# Patient Record
Sex: Female | Born: 1945 | Race: White | Hispanic: No | Smoking: Former smoker
Health system: Southern US, Community
[De-identification: ages and names within clinical notes are randomized; demographics above are authoritative.]

## PROBLEM LIST (undated history)

## (undated) DIAGNOSIS — R0600 Dyspnea, unspecified: Secondary | ICD-10-CM

## (undated) DIAGNOSIS — F32A Depression, unspecified: Secondary | ICD-10-CM

## (undated) DIAGNOSIS — Z972 Presence of dental prosthetic device (complete) (partial): Secondary | ICD-10-CM

## (undated) DIAGNOSIS — D497 Neoplasm of unspecified behavior of endocrine glands and other parts of nervous system: Secondary | ICD-10-CM

## (undated) DIAGNOSIS — C4491 Basal cell carcinoma of skin, unspecified: Secondary | ICD-10-CM

## (undated) DIAGNOSIS — J45909 Unspecified asthma, uncomplicated: Secondary | ICD-10-CM

## (undated) DIAGNOSIS — E782 Mixed hyperlipidemia: Secondary | ICD-10-CM

## (undated) DIAGNOSIS — M542 Cervicalgia: Secondary | ICD-10-CM

## (undated) DIAGNOSIS — J449 Chronic obstructive pulmonary disease, unspecified: Secondary | ICD-10-CM

## (undated) DIAGNOSIS — Z85828 Personal history of other malignant neoplasm of skin: Secondary | ICD-10-CM

## (undated) DIAGNOSIS — E039 Hypothyroidism, unspecified: Secondary | ICD-10-CM

## (undated) DIAGNOSIS — D649 Anemia, unspecified: Secondary | ICD-10-CM

## (undated) DIAGNOSIS — R238 Other skin changes: Secondary | ICD-10-CM

## (undated) DIAGNOSIS — G4730 Sleep apnea, unspecified: Secondary | ICD-10-CM

## (undated) DIAGNOSIS — R911 Solitary pulmonary nodule: Secondary | ICD-10-CM

## (undated) DIAGNOSIS — M7989 Other specified soft tissue disorders: Secondary | ICD-10-CM

## (undated) DIAGNOSIS — Z973 Presence of spectacles and contact lenses: Secondary | ICD-10-CM

## (undated) DIAGNOSIS — F419 Anxiety disorder, unspecified: Secondary | ICD-10-CM

## (undated) DIAGNOSIS — H269 Unspecified cataract: Secondary | ICD-10-CM

## (undated) DIAGNOSIS — R233 Spontaneous ecchymoses: Secondary | ICD-10-CM

## (undated) DIAGNOSIS — R350 Frequency of micturition: Secondary | ICD-10-CM

## (undated) DIAGNOSIS — R519 Headache, unspecified: Secondary | ICD-10-CM

## (undated) DIAGNOSIS — J189 Pneumonia, unspecified organism: Secondary | ICD-10-CM

## (undated) DIAGNOSIS — I10 Essential (primary) hypertension: Secondary | ICD-10-CM

## (undated) HISTORY — PX: SKIN CANCER EXCISION: SHX779

## (undated) HISTORY — PX: FOOT SURGERY: SHX648

## (undated) HISTORY — DX: Mixed hyperlipidemia: E78.2

## (undated) HISTORY — DX: Anxiety disorder, unspecified: F41.9

## (undated) HISTORY — DX: Essential (primary) hypertension: I10

## (undated) HISTORY — DX: Hypercalcemia: E83.52

## (undated) HISTORY — PX: COLONOSCOPY: SHX174

## (undated) HISTORY — DX: Basal cell carcinoma of skin, unspecified: C44.91

## (undated) HISTORY — PX: EYE SURGERY: SHX253

## (undated) HISTORY — DX: Cervicalgia: M54.2

## (undated) HISTORY — PX: DENTAL SURGERY: SHX609

## (undated) HISTORY — PX: VAGINAL HYSTERECTOMY: SHX2639

---

## 1998-12-02 ENCOUNTER — Other Ambulatory Visit: Admission: RE | Admit: 1998-12-02 | Discharge: 1998-12-02 | Payer: Self-pay | Admitting: Family Medicine

## 2000-10-31 ENCOUNTER — Other Ambulatory Visit: Admission: RE | Admit: 2000-10-31 | Discharge: 2000-10-31 | Payer: Self-pay | Admitting: *Deleted

## 2003-11-17 ENCOUNTER — Emergency Department (HOSPITAL_COMMUNITY): Admission: EM | Admit: 2003-11-17 | Discharge: 2003-11-18 | Payer: Self-pay | Admitting: Emergency Medicine

## 2004-02-05 ENCOUNTER — Other Ambulatory Visit: Admission: RE | Admit: 2004-02-05 | Discharge: 2004-02-05 | Payer: Self-pay | Admitting: Family Medicine

## 2005-02-28 ENCOUNTER — Other Ambulatory Visit: Admission: RE | Admit: 2005-02-28 | Discharge: 2005-02-28 | Payer: Self-pay | Admitting: Family Medicine

## 2005-03-03 ENCOUNTER — Ambulatory Visit: Admission: RE | Admit: 2005-03-03 | Discharge: 2005-03-03 | Payer: Self-pay | Admitting: Family Medicine

## 2006-01-06 ENCOUNTER — Ambulatory Visit (HOSPITAL_BASED_OUTPATIENT_CLINIC_OR_DEPARTMENT_OTHER): Admission: RE | Admit: 2006-01-06 | Discharge: 2006-01-06 | Payer: Self-pay | Admitting: *Deleted

## 2007-08-01 IMAGING — US US-BREAST([ID])
1 series · 12 of 12 positions shown · non-contrast
Comparison: NONE

CLINICAL DATA: Follow-up mammogram. 

RIGHT BREAST ULTRASOUND

[Series 1: us right breast · 0.07mm/px · 12 of 12 slices shown]
[im 1/12]
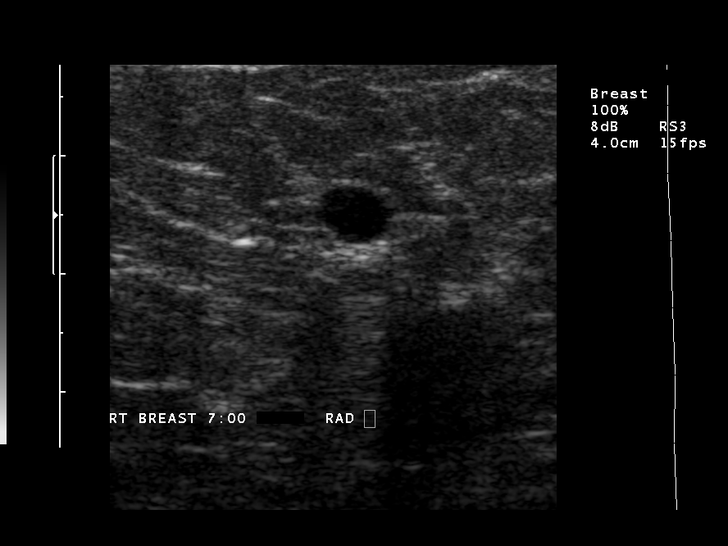
[im 2/12]
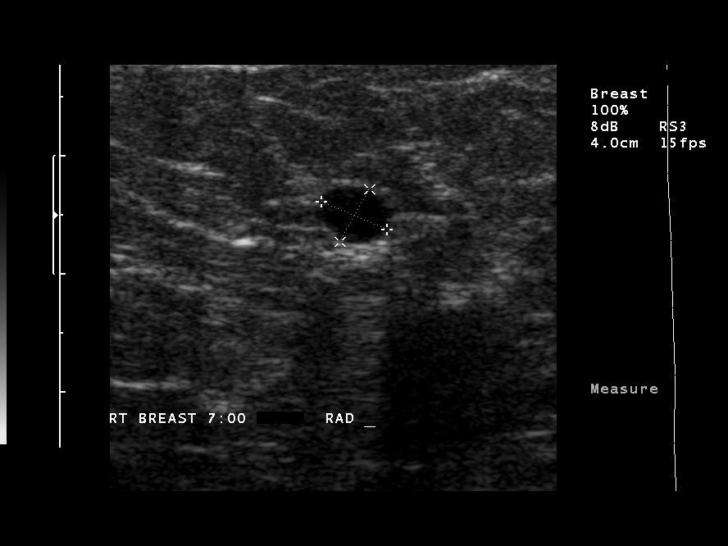
[im 3/12]
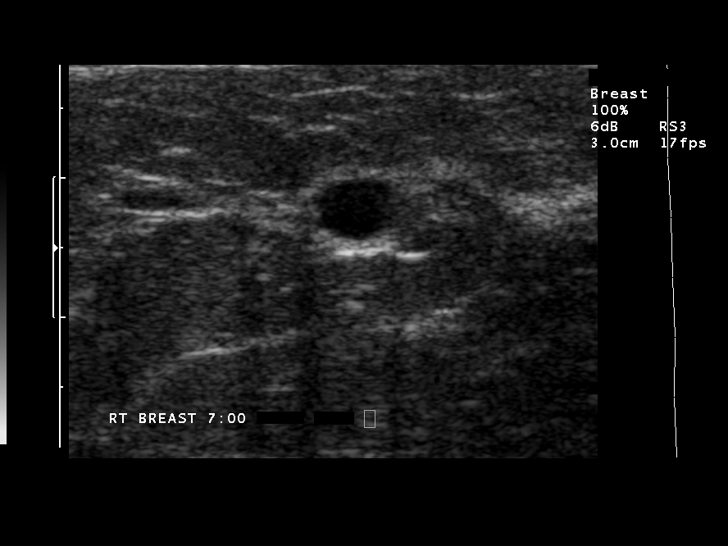
[im 4/12]
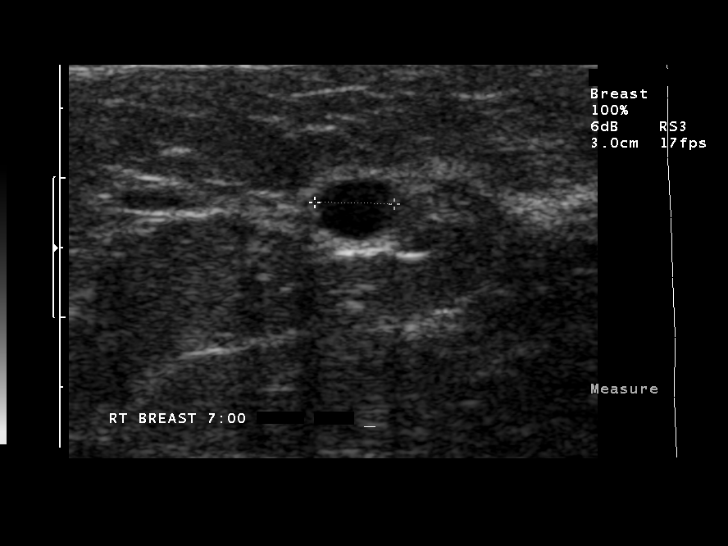
[im 5/12]
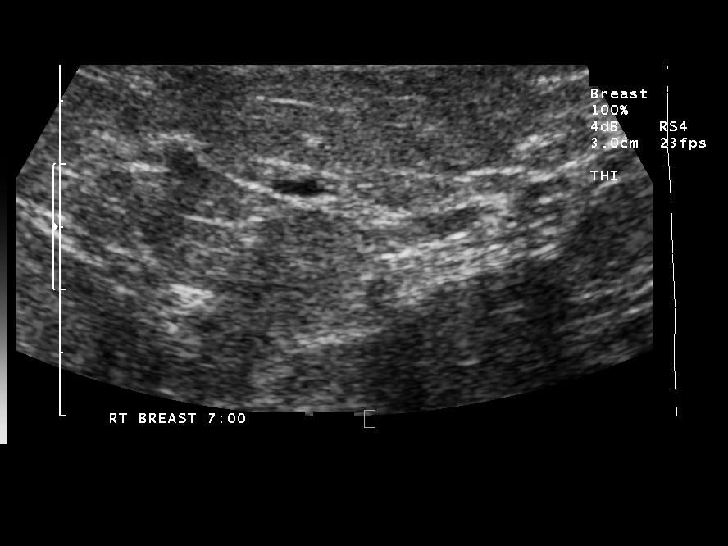
[im 6/12]
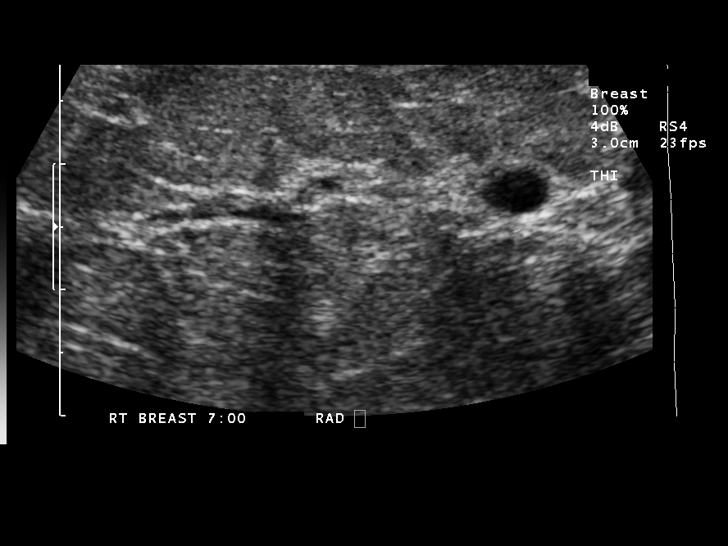
[im 7/12]
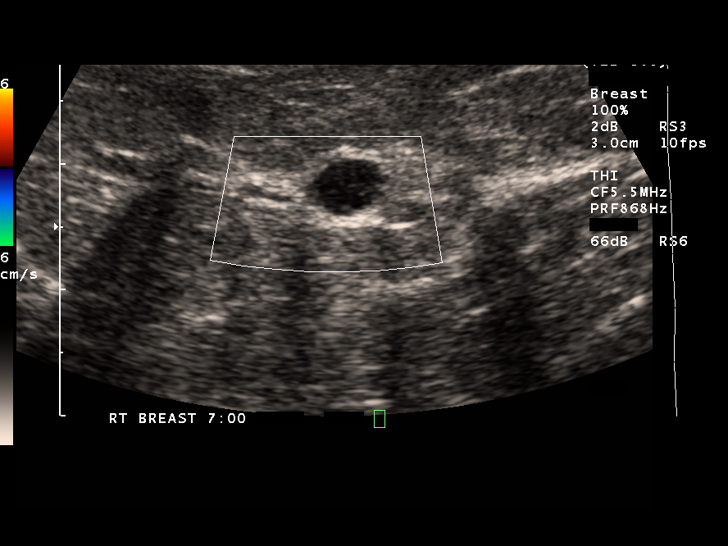
[im 8/12]
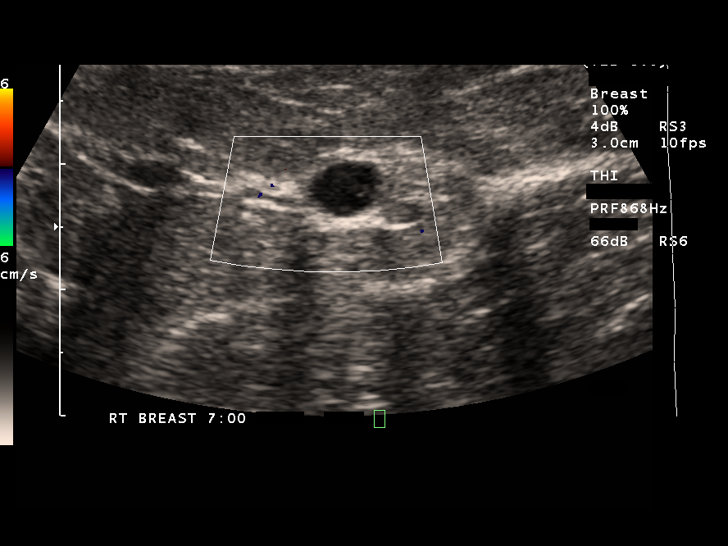
[im 9/12]
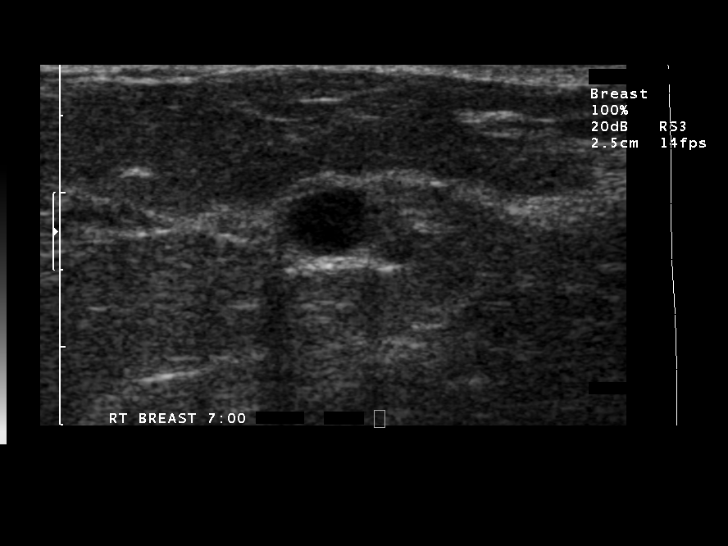
[im 10/12]
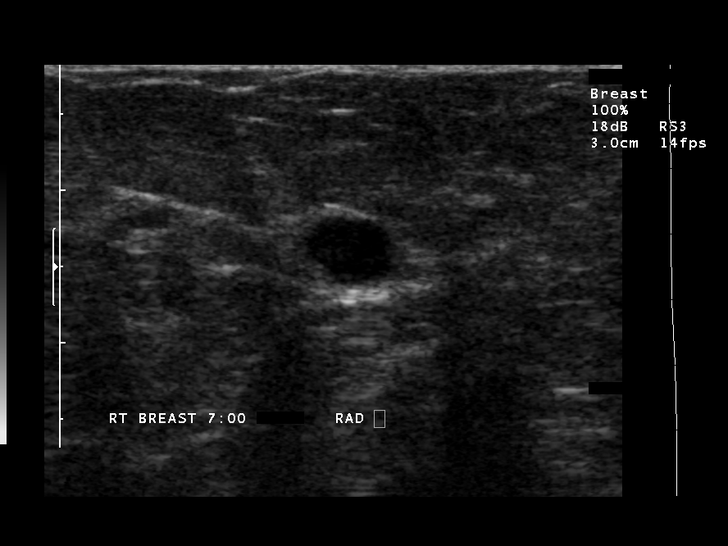
[im 11/12]
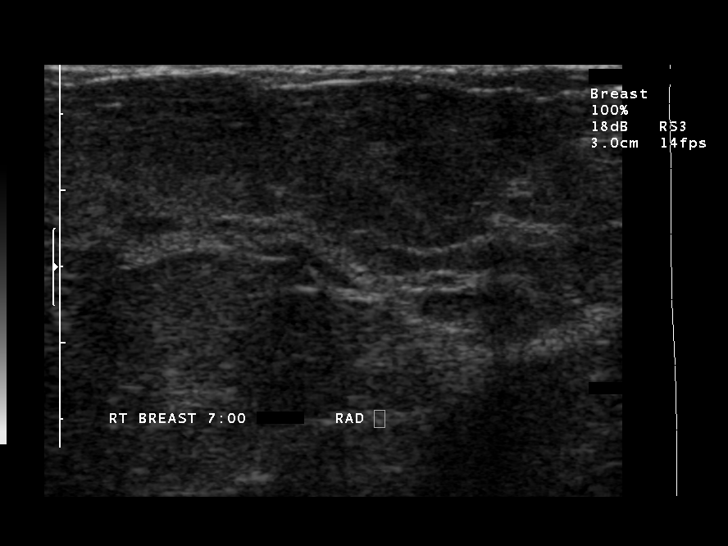
[im 12/12]
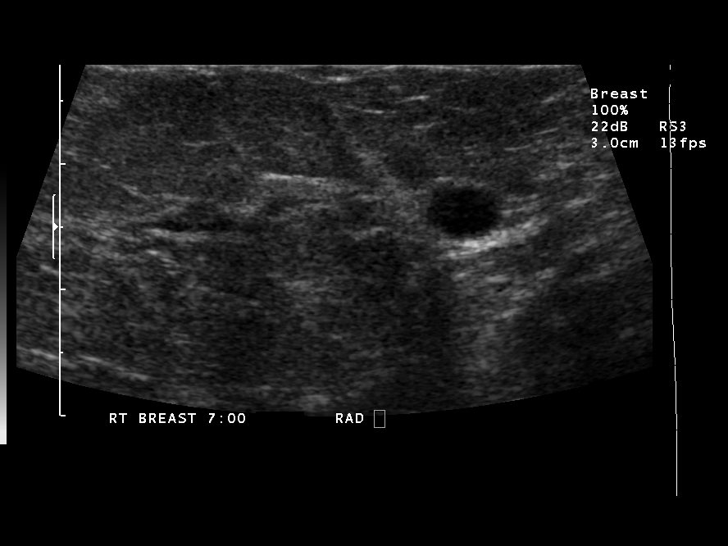

[12 of 12 positions shown; findings below may reference images not displayed]

FINDINGS: Ultrasound was performed to evaluate a nodule noted 
centrally in the inferior right breast.  Located in the 7 o???clock 
position of the right breast, 4 cm from the nipple is an anechoic 
cyst measuring 0.6 x 0.6 x 0.5 cm.  This corresponds in location 
and size to the mammographic finding.  There are scattered mildly 
dilated ducts also noted near by.
IMPRESSION: Additional evaluation of the right breast nodule 
confirms a small simple cyst.  Routine annual screening has been 
recommended. BI-RADS 2- Benign LUSTER, M.D. 
electronically reviewed on [DATE] Dict Date: [DATE]  Tran 
Date: [DATE] CAV  LUSTER

## 2010-05-13 DIAGNOSIS — M949 Disorder of cartilage, unspecified: Secondary | ICD-10-CM

## 2010-05-13 DIAGNOSIS — R32 Unspecified urinary incontinence: Secondary | ICD-10-CM | POA: Insufficient documentation

## 2010-05-13 DIAGNOSIS — C44300 Unspecified malignant neoplasm of skin of unspecified part of face: Secondary | ICD-10-CM | POA: Insufficient documentation

## 2010-05-13 DIAGNOSIS — F411 Generalized anxiety disorder: Secondary | ICD-10-CM | POA: Insufficient documentation

## 2010-05-13 DIAGNOSIS — E782 Mixed hyperlipidemia: Secondary | ICD-10-CM | POA: Insufficient documentation

## 2010-05-13 DIAGNOSIS — C44309 Unspecified malignant neoplasm of skin of other parts of face: Secondary | ICD-10-CM | POA: Insufficient documentation

## 2010-05-13 DIAGNOSIS — I10 Essential (primary) hypertension: Secondary | ICD-10-CM | POA: Insufficient documentation

## 2010-05-13 DIAGNOSIS — M899 Disorder of bone, unspecified: Secondary | ICD-10-CM | POA: Insufficient documentation

## 2010-05-13 DIAGNOSIS — M542 Cervicalgia: Secondary | ICD-10-CM | POA: Insufficient documentation

## 2010-05-14 ENCOUNTER — Ambulatory Visit: Payer: Self-pay | Admitting: Pulmonary Disease

## 2010-05-14 DIAGNOSIS — R0989 Other specified symptoms and signs involving the circulatory and respiratory systems: Secondary | ICD-10-CM | POA: Insufficient documentation

## 2010-05-14 DIAGNOSIS — R0609 Other forms of dyspnea: Secondary | ICD-10-CM | POA: Insufficient documentation

## 2010-06-11 ENCOUNTER — Ambulatory Visit: Payer: Self-pay | Admitting: Pulmonary Disease

## 2010-06-11 DIAGNOSIS — J449 Chronic obstructive pulmonary disease, unspecified: Secondary | ICD-10-CM | POA: Insufficient documentation

## 2010-08-12 ENCOUNTER — Telehealth: Payer: Self-pay | Admitting: Pulmonary Disease

## 2010-11-16 NOTE — Assessment & Plan Note (Signed)
Summary: rov for review of pfts.   Copy to:  [name] Primary Provider/Referring Provider:  [name]  CC:  Pt is here for a f/u appt to discuss PFT results. .  History of Present Illness: The pt comes in today for f/u of her recent pfts.  She was found to have severe airflow obstruction, as well as signficant airtrapping.  Her DLCO was moderately reduced.  I have reviewed the results with the pt, and answered all of her questions.  She is still smoking, and has not been as compliant with her meds as she should due to cost.    Medications Prior to Update: 1)  Amlodipine Besy-Benazepril Hcl 5-10 Mg Caps (Amlodipine Besy-Benazepril Hcl) .... Take 1 Tablet By Mouth Once A Day 2)  Bisoprolol-Hydrochlorothiazide 5-6.25 Mg Tabs (Bisoprolol-Hydrochlorothiazide) .... Take 2 Tabs By Mouth Daily 3)  Pravastatin Sodium 40 Mg Tabs (Pravastatin Sodium) .... Take 1 Tablet By Mouth Once A Day 4)  Buspirone Hcl 30 Mg Tabs (Buspirone Hcl) .... Take 1 Tablet By Mouth Two Times A Day 5)  Ambien 10 Mg Tabs (Zolpidem Tartrate) .... Take 1 Tab By Mouth At Bedtime 6)  Symbicort 80-4.5 Mcg/act  Aero (Budesonide-Formoterol Fumarate) .... Two Puffs Twice Daily 7)  Spiriva Handihaler 18 Mcg Caps (Tiotropium Bromide Monohydrate) .... Inhale Contents of 1 Capsule Once A Day 8)  Proair Hfa 108 (90 Base) Mcg/act  Aers (Albuterol Sulfate) .Marland Kitchen.. 1-2 Puffs Every 4-6 Hours As Needed 9)  Aspirin Low Dose 81 Mg Tabs (Aspirin) .... Take 1 Tablet By Mouth Once A Day.  Allergies (verified): 1)  ! Codeine  Review of Systems       The patient complains of shortness of breath with activity and nasal congestion/difficulty breathing through nose.  The patient denies shortness of breath at rest, productive cough, non-productive cough, coughing up blood, chest pain, irregular heartbeats, acid heartburn, indigestion, loss of appetite, weight change, abdominal pain, difficulty swallowing, sore throat, tooth/dental problems,  headaches, sneezing, itching, ear ache, anxiety, depression, hand/feet swelling, joint stiffness or pain, rash, change in color of mucus, and fever.    Vital Signs:  Patient profile:   65 year old female Height:      60 inches Weight:      165 pounds BMI:     32.34 O2 Sat:      93 % on Room air Temp:     97.5 degrees F oral Pulse rate:   63 / minute BP sitting:   126 / 78  (left arm) Cuff size:   regular  Vitals Entered By: Arman Filter LPN (June 11, 2010 1:44 PM)  O2 Flow:  Room air CC: Pt is here for a f/u appt to discuss PFT results.  Comments Medications reviewed with patient Arman Filter LPN  June 11, 2010 1:50 PM    Physical Exam  General:  ow female in nad Lungs:  decreased bs throughout, no wheezing or rhonchi Heart:  rrr, no mrg Extremities:  mild edema, no cyanosis  Neurologic:  alert and oriented, moves all 4.   Impression & Recommendations:  Problem # 1:  EMPHYSEMA (ICD-492.8)  The pt has severe airflow obstruction on her pfts today, most of which I suspect is irreversible.  She is on a good bronchodilator regimen, although I think she needs to be on the higher strength of symbicort.  The most important thing that she can do is to stop smoking.  I have also discussed with her the role of weight  loss and pulmonary rehab.  I have offered to refer her to the pulmonary rehab program near her, and she will consider.    Medications Added to Medication List This Visit: 1)  Symbicort 160-4.5 Mcg/act Aero (Budesonide-formoterol fumarate) .... Two puffs twice daily  Other Orders: Est. Patient Level III (04540)  Patient Instructions: 1)  will increase your symbicort to the 160/4.5 strength...2 puffs am and pm..rinse your mouth well. 2)  stay on spiriva 3)  work on weight loss, consider pulmonary rehab program in [place]. 4)  followup with me in 6mos.  Prescriptions: SYMBICORT 160-4.5 MCG/ACT  AERO (BUDESONIDE-FORMOTEROL FUMARATE) Two puffs twice daily  #1 x  6   Entered and Authorized by:   Barbaraann Share MD   Signed by:   Barbaraann Share MD on 06/11/2010   Method used:   Print then Give to Patient   RxID:   [id number]

## 2010-11-16 NOTE — Assessment & Plan Note (Signed)
Summary: consult for dyspnea   Copy to:  [name] Primary Provider/Referring Provider:  [name]  CC:  Pulmonary Consult.  History of Present Illness: The pt is a 65y/o female who I have been asked to see for dyspnea.  She notes chronic doe that has been present for years, but she notes progressive symptoms over the last 1-2 years.  She describes a less than one block doe at a moderate pace on flat ground, and gets winded bringing groceries in from the car.  She has a mild cough with white foamy mucus in the am's, and also has had some LE edema.  She has a long h/o smoking, and continues to do so.  She has tried numerous medications for smoking cessation, as well as hypnotism.  She has had a recent cxr and was told it was "ok", but has not had recent pfts.  The pt does note weight gain over the last 2 years, but is not sure how much.  Preventive Screening-Counseling & Management  Alcohol-Tobacco     Smoking Status: current     Smoking Cessation Counseling: yes     Packs/Day: 1.0     Year Started: age 4     Tobacco Counseling: to quit use of tobacco products  Comments: pt has failed chantix, nicotrol inhaler, hypnotism, and wellbutrin  Current Medications (verified): 1)  Amlodipine Besy-Benazepril Hcl 5-10 Mg Caps (Amlodipine Besy-Benazepril Hcl) .... Take 1 Tablet By Mouth Once A Day 2)  Bisoprolol-Hydrochlorothiazide 5-6.25 Mg Tabs (Bisoprolol-Hydrochlorothiazide) .... Take 2 Tabs By Mouth Daily 3)  Pravastatin Sodium 40 Mg Tabs (Pravastatin Sodium) .... Take 1 Tablet By Mouth Once A Day 4)  Buspirone Hcl 30 Mg Tabs (Buspirone Hcl) .... Take 1 Tablet By Mouth Two Times A Day 5)  Ambien 10 Mg Tabs (Zolpidem Tartrate) .... Take 1 Tab By Mouth At Bedtime 6)  Symbicort 80-4.5 Mcg/act  Aero (Budesonide-Formoterol Fumarate) .... Two Puffs Twice Daily 7)  Spiriva Handihaler 18 Mcg Caps (Tiotropium Bromide Monohydrate) .... Inhale Contents of 1 Capsule Once A Day 8)  Proair Hfa 108  (90 Base) Mcg/act  Aers (Albuterol Sulfate) .Marland Kitchen.. 1-2 Puffs Every 4-6 Hours As Needed 9)  Aspirin Low Dose 81 Mg Tabs (Aspirin) .... Take 1 Tablet By Mouth Once A Day.  Allergies (verified): 1)  ! Codeine  Past History:  Past Medical History:   OSTEOPENIA (ICD-733.90) CARCINOMA, BASAL CELL, NOSE (ICD-173.3) URINARY INCONTINENCE (ICD-788.30) ANXIETY (ICD-300.00) CERVICALGIA (ICD-723.1) HYPERTENSION (ICD-401.9) HYPERCALCEMIA (ICD-275.42) HYPERLIPIDEMIA, MIXED (ICD-272.2)    Past Surgical History: hysterectomy without oophorectomy for fibroids  35 years ago  Family History: Reviewed history from 05/13/2010 and no changes required. father: deceased CAD, HTN, asthma, allergies mother: deceased HTN, lung cancer, smoker paternal grandfather: deceased paternal grandmother: deceased maternal grandfather: deceased maternal grandmother: deceased sister: deceased cirrhosis  Social History: Reviewed history from 05/13/2010 and no changes required. current smoker.  1 ppd.  started before age 65.  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 pt has 2 children. Smoking Status:  current Packs/Day:  1.0  Review of Systems       The patient complains of shortness of breath with activity, productive cough, nasal congestion/difficulty breathing through nose, and hand/feet swelling.  The patient denies shortness of breath at rest, non-productive cough, coughing up blood, chest pain, irregular heartbeats, acid heartburn, indigestion, loss of appetite, weight change, abdominal pain, difficulty swallowing, sore throat, tooth/dental problems, headaches, sneezing, itching, ear ache, anxiety, depression, joint stiffness or  pain, rash, change in color of mucus, and fever.    Vital Signs:  Patient profile:   65 year old female Height:      60 inches Weight:      162 pounds BMI:     31.75 O2 Sat:      92 % on Room air Temp:     98.9 degrees F oral Pulse rate:    75 / minute BP sitting:   136 / 72  (left arm) Cuff size:   regular  Vitals Entered By: Arman Filter LPN (May 14, 2010 1:26 PM)  O2 Flow:  Room air  Serial Vital Signs/Assessments:  Comments: 2:05 PM Ambulatory Pulse Oximetry  Resting; HR__72___    02 Sat__94% on room air__  Lap1 (185 feet)   HR__82___   02 Sat__92% on room air___ Lap2 (185 feet)   HR__86___   02 Sat__90% on room air___    Lap3 (185 feet)   HR__89___   02 Sat__90% on room air___  _x_Test Completed without Difficulty ___Test Stopped due to:  By: Michel Bickers CMA   CC: Pulmonary Consult Comments Medications reviewed with patient Arman Filter LPN  May 14, 2010 1:26 PM    Physical Exam  General:  ow female in nad Eyes:  PERRLA and EOMI.   Nose:  deviated septum to left with narrowing Mouth:  clear, no exudates. Neck:  no jvd, tmg, LN Lungs:  mild decrease in bs, no wheezing or rhonchi Heart:  rrr, no mrg Abdomen:  soft and nontender, bs+ Extremities:  no signficant edema, no cyanosis pulses intact distally Neurologic:  alert and oriented, moves all 4.   Impression & Recommendations:  Problem # 1:  DYSPNEA ON EXERTION (ICD-786.09) the pt has doe that I suspect is multifactorial.  She is overweight and deconditioned, and may have underlying lung disease related to her ongoing smoking.  It is unclear whether she may have emphysema, or just airway inflammation related to the smoking.  She is on an excellent bronchodilator regimen, although the symbicort is at the lower dose.  I think she needs to have pfts to establish her underlying disease state and its severity.  I have also discussed with her the importance of smoking cessation, and she is going to try the nicotine patch.  I have also recommended the classes at the cancer center monthly, and have given her the contact number.    Medications Added to Medication List This Visit: 1)  Bisoprolol-hydrochlorothiazide 5-6.25 Mg Tabs  (Bisoprolol-hydrochlorothiazide) .... Take 2 tabs by mouth daily 2)  Symbicort 80-4.5 Mcg/act Aero (Budesonide-formoterol fumarate) .... Two puffs twice daily 3)  Aspirin Low Dose 81 Mg Tabs (Aspirin) .... Take 1 tablet by mouth once a day.  Other Orders: Consultation Level IV (16109) Tobacco use cessation intermediate 3-10 minutes (99406) Pulse Oximetry, Ambulatory (60454) Pulmonary Referral (Pulmonary)  Patient Instructions: 1)  will schedule for breathing tests, and see you back the same day for review. 2)  no change in meds for now. 3)  will get cxr report to review. 4)  work on quitting smoking....consider patch and the classes at cancer center...see brochure.

## 2010-11-16 NOTE — Progress Notes (Signed)
Summary: samples of symbicort  Phone Note Call from Patient   Caller: Patient--[phone] Call For: Kajol Crispen Reason for Call: Talk to Nurse Summary of Call: Requesting samples of symbicort. Initial call taken by: Lehman Prom,  August 12, 2010 10:35 AM  Follow-up for Phone Call        pt advised no samples available at this time.Carron Curie CMA  August 12, 2010 11:15 AM

## 2010-11-16 NOTE — Miscellaneous (Signed)
Summary: Orders Update pft charges  Clinical Lists Changes  Orders: Added new Service order of Carbon Monoxide diffusing w/capacity (94720) - Signed Added new Service order of Lung Volumes (94240) - Signed Added new Service order of Spirometry (Pre & Post) (94060) - Signed 

## 2010-12-08 ENCOUNTER — Ambulatory Visit: Payer: Self-pay | Admitting: Pulmonary Disease

## 2010-12-29 ENCOUNTER — Ambulatory Visit (INDEPENDENT_AMBULATORY_CARE_PROVIDER_SITE_OTHER): Payer: Medicare Other | Admitting: Pulmonary Disease

## 2010-12-29 ENCOUNTER — Encounter: Payer: Self-pay | Admitting: Pulmonary Disease

## 2010-12-29 DIAGNOSIS — J438 Other emphysema: Secondary | ICD-10-CM

## 2011-01-04 NOTE — Assessment & Plan Note (Signed)
Summary: Holly [name] for emphysema   Primary Provider/Referring Provider:  [name]  CC:  6 month f/u appt for emphysema.  Pt states her breathing has improved with the change in Symbicort dosage.  Pt has decreased her smoking to 1/2 ppd. .  History of Present Illness: the pt comes in today for f/u of her known emphysema.  She was increased to the higher dose of symbicort last visit, and has seen improvement in her breathing since that time.  However, she has also cut back on her smoking as well.  She denies any chest congestion or purulent mucus.    Preventive Screening-Counseling & Management  Alcohol-Tobacco     Smoking Status: current     Smoking Cessation Counseling: yes     Packs/Day: 0.5     Tobacco Counseling: to quit use of tobacco products  Current Medications (verified): 1)  Amlodipine Besy-Benazepril Hcl 5-10 Mg Caps (Amlodipine Besy-Benazepril Hcl) .... Take 1 Tablet By Mouth Once A Day 2)  Bisoprolol-Hydrochlorothiazide 5-6.25 Mg Tabs (Bisoprolol-Hydrochlorothiazide) .... Take 2 Tabs By Mouth Daily 3)  Pravastatin Sodium 40 Mg Tabs (Pravastatin Sodium) .... Take 1 Tablet By Mouth Once A Day 4)  Buspirone Hcl 30 Mg Tabs (Buspirone Hcl) .... Take 1 Tablet By Mouth Two Times A Day 5)  Ambien 10 Mg Tabs (Zolpidem Tartrate) .... Take 1 Tab By Mouth At Bedtime 6)  Spiriva Handihaler 18 Mcg Caps (Tiotropium Bromide Monohydrate) .... Inhale Contents of 1 Capsule Once A Day 7)  Proair Hfa 108 (90 Base) Mcg/act  Aers (Albuterol Sulfate) .Marland Kitchen.. 1-2 Puffs Every 4-6 Hours As Needed 8)  Aspirin Low Dose 81 Mg Tabs (Aspirin) .... Take 1 Tablet By Mouth Once A Day. 9)  Symbicort 160-4.5 Mcg/act  Aero (Budesonide-Formoterol Fumarate) .... Two Puffs Twice Daily  Allergies (verified): 1)  ! Codeine  Social History: current smoker.  1 ppd.  started before age 73.  currently 1/2 ppd.  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 pt has 2  children. Packs/Day:  0.5  Review of Systems       The patient complains of weight change, anxiety, and joint stiffness or pain.  The patient denies shortness of breath with activity, shortness of breath at rest, productive cough, non-productive cough, coughing up blood, chest pain, irregular heartbeats, acid heartburn, indigestion, loss of appetite, abdominal pain, difficulty swallowing, sore throat, tooth/dental problems, headaches, nasal congestion/difficulty breathing through nose, sneezing, itching, ear ache, depression, hand/feet swelling, rash, change in color of mucus, and fever.    Vital Signs:  Patient profile:   65 year old female Height:      60 inches Weight:      156.50 pounds BMI:     30.67 O2 Sat:      94 % on Room air Temp:     98.1 degrees F oral Pulse rate:   69 / minute BP sitting:   140 / 80  (right arm) Cuff size:   regular  Vitals Entered By: Arman Filter LPN (December 29, 2010 10:04 AM)  O2 Flow:  Room air CC: 6 month f/u appt for emphysema.  Pt states her breathing has improved with the change in Symbicort dosage.  Pt has decreased her smoking to 1/2 ppd.  Comments Medications reviewed with patient Arman Filter LPN  December 29, 2010 10:07 AM    Physical Exam  General:  ow female in nad  Lungs:  decreased bs, no wheezing or rhonchi  Heart:  rrr,  no mrg  Extremities:  no edema or cyanosis  Neurologic:  alert and oriented, moves all 4    Impression & Recommendations:  Problem # 1:  EMPHYSEMA (ICD-492.8)  the pt is improved since cutting back on smoking and on the higher symbicort dose.  I have asked her to maintain on current meds, and to stop smoking 100%.  She also would benefit from an exercise and weight loss program.  Other Orders: Est. Patient Level III (42595) Tobacco use cessation intermediate 3-10 minutes (63875)  Patient Instructions: 1)  continue on current meds. 2)  stop smoking completely 3)  followup with me in 6mos, or sooner if  having issues.

## 2011-03-04 NOTE — Op Note (Signed)
Holly [name], Holly [name]           ACCOUNT NO.:  [id number]   MEDICAL RECORD NO.:  [id number]          PATIENT TYPE:  AMB   LOCATION:  [facility]                          FACILITY:  [facility]   PHYSICIAN:  Tennis Must Meyerdierks, M.D.DATE OF BIRTH:  29-Aug-1946   DATE OF PROCEDURE:  01/06/2006  DATE OF DISCHARGE:  01/06/2006                                 OPERATIVE REPORT   PREOPERATIVE DIAGNOSIS:  Right carpal tunnel syndrome.   POSTOPERATIVE DIAGNOSIS:  Right carpal tunnel syndrome.   PROCEDURE:  Decompression of median nerve, right carpal tunnel.   SURGEON:  Lowell Bouton, M.D.   ANESTHESIA:  0.5% Marcaine local with sedation.   OPERATIVE FINDINGS:  The patient had no masses in the carpal canal.  The  motor branch of the nerve was intact.   PROCEDURE:  Under 0.5% Marcaine anesthesia with a tourniquet on the right  arm, the right hand was prepped and draped in the usual fashion and after  exsanguinating the limb, the tourniquet was inflated to 250 mmHg.  A 3-cm  longitudinal incision was made in the palm just ulnar to the thenar crease.  Sharp dissection was carried through the subcutaneous tissues.  Blunt  dissection was carried down through the superficial palmar fascia distal to  the ligament.  A hemostat was then placed in the carpal canal up against the  hook of the hamate and the transverse carpal ligament was divided on the  ulnar border of the median nerve.  The proximal end of the ligament was  divided with the scissors after dissecting the nerve away from the  undersurface of the ligament.  The carpal canal was then palpated and was  found to be adequately decompressed.  The nerve was examined and the motor  branch was identified.  The wound was then irrigated with saline and the  skin was closed with 4-0 nylon sutures.  Sterile dressings were applied  followed by a volar wrist splint.  The patient tolerated the procedure well  and went to the recovery room awake  and stable, in good condition.      Lowell Bouton, M.D.  Electronically Signed     EMM/MEDQ  D:  01/06/2006  T:  01/09/2006  Job:  [id number]

## 2011-03-22 ENCOUNTER — Telehealth: Payer: Self-pay | Admitting: Pulmonary Disease

## 2011-03-22 NOTE — Telephone Encounter (Signed)
Pt last seen by [facility] 3.14.12, was told to f/u in 6 months.  Per last note, pt was to continue symbicort 160-4.76mcg and proair prn.  1 sample of each left up front for pt to pick up at her convenience.  Pt is aware.  Will sign off on message.

## 2011-05-23 ENCOUNTER — Telehealth: Payer: Self-pay | Admitting: Pulmonary Disease

## 2011-05-23 NOTE — Telephone Encounter (Signed)
Pt aware 1 sample of each left out front

## 2011-06-30 ENCOUNTER — Encounter: Payer: Self-pay | Admitting: Pulmonary Disease

## 2011-07-01 ENCOUNTER — Ambulatory Visit (INDEPENDENT_AMBULATORY_CARE_PROVIDER_SITE_OTHER): Payer: Medicare Other | Admitting: Pulmonary Disease

## 2011-07-01 ENCOUNTER — Encounter: Payer: Self-pay | Admitting: Pulmonary Disease

## 2011-07-01 VITALS — BP 122/80 | HR 58 | Temp 98.4°F | Ht 60.0 in | Wt 171.2 lb

## 2011-07-01 DIAGNOSIS — J449 Chronic obstructive pulmonary disease, unspecified: Secondary | ICD-10-CM

## 2011-07-01 NOTE — Assessment & Plan Note (Signed)
The pt is maintaining her usual baseline, but admits she had worsening breathing this summer due to heat/humidity and increased quantity of smoking.  She wishes to quit, and will refer her to the smoking cessation classes at the cancer center.  She is also obese and deconditioned, and would benefit from pulm rehab.  I have asked her to stay on her current regimen which seems to be working for her.  I have also cautioned her about her upcoming dental procedure where she will be "put to sleep".  I have asked her to not smoke leading up to the procedure, and to not have procedure if she is sick or not breathing at baseline.

## 2011-07-01 NOTE — Patient Instructions (Signed)
Continue on current meds Stop smoking.  Consider classes at [facility]  See flyer Will refer you to pulmonary rehab at [facility] Stop smoking at least until your upcoming dental procedure. followup with me in 6mos.

## 2011-07-01 NOTE — Progress Notes (Signed)
  Subjective:    Patient ID: Holly [name], female    DOB: 1946/04/21, 65 y.o.   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  She is staying on her bronchodilator regimen compliantly, but unfortunately continues to smoke.  She has gained significant weight since last visit, and has increased her smoking as well, and has noticed some increasing shortness of breath.  The heat and humidity this summer were also factors for her.  She denies any significant worsening of her shortness of breath, and has no significant cough or purulent mucus.  She wishes to participate in pulmonary rehabilitation, and also some type of smoking cessation class.   Review of Systems  Constitutional: Negative for fever and unexpected weight change.  HENT: Positive for dental problem. Negative for ear pain, nosebleeds, congestion, sore throat, rhinorrhea, sneezing, trouble swallowing, postnasal drip and sinus pressure.   Eyes: Negative for redness and itching.  Respiratory: Positive for cough, chest tightness, shortness of breath and wheezing.   Cardiovascular: Negative for palpitations and leg swelling.  Gastrointestinal: Negative for nausea and vomiting.  Genitourinary: Negative for dysuria.  Musculoskeletal: Negative for joint swelling.  Skin: Negative for rash.  Neurological: Negative for headaches.  Hematological: Bruises/bleeds easily.  Psychiatric/Behavioral: Negative for dysphoric mood. The patient is not nervous/anxious.        Objective:   Physical Exam Obese female in no acute distress Nose without purulence or discharge Chest with mild decrease in breath sounds but no wheezes or rhonchi Cardiac exam with regular rate and rhythm Lower extremities without edema, No cyanosis noted Alert and oriented, moves all 4 extremities.       Assessment & Plan:

## 2011-08-03 ENCOUNTER — Encounter (HOSPITAL_COMMUNITY)
Admission: RE | Admit: 2011-08-03 | Discharge: 2011-08-03 | Disposition: A | Discharge status: Home / Self Care | Payer: Medicare Other | Source: Ambulatory Visit | Attending: Pulmonary Disease | Admitting: Pulmonary Disease

## 2011-08-03 DIAGNOSIS — J449 Chronic obstructive pulmonary disease, unspecified: Secondary | ICD-10-CM | POA: Insufficient documentation

## 2011-08-03 DIAGNOSIS — R0602 Shortness of breath: Secondary | ICD-10-CM | POA: Insufficient documentation

## 2011-08-03 DIAGNOSIS — F17200 Nicotine dependence, unspecified, uncomplicated: Secondary | ICD-10-CM | POA: Insufficient documentation

## 2011-08-03 DIAGNOSIS — Z5189 Encounter for other specified aftercare: Secondary | ICD-10-CM | POA: Insufficient documentation

## 2011-08-03 DIAGNOSIS — J4489 Other specified chronic obstructive pulmonary disease: Secondary | ICD-10-CM | POA: Insufficient documentation

## 2011-08-04 ENCOUNTER — Encounter (HOSPITAL_COMMUNITY): Payer: Medicare Other

## 2011-08-09 ENCOUNTER — Encounter (HOSPITAL_COMMUNITY): Payer: Medicare Other

## 2011-08-11 ENCOUNTER — Encounter (HOSPITAL_COMMUNITY): Payer: Medicare Other

## 2011-08-16 ENCOUNTER — Encounter (HOSPITAL_COMMUNITY): Payer: Medicare Other

## 2011-08-18 ENCOUNTER — Encounter (HOSPITAL_COMMUNITY): Payer: Medicare Other

## 2011-08-18 DIAGNOSIS — J449 Chronic obstructive pulmonary disease, unspecified: Secondary | ICD-10-CM | POA: Insufficient documentation

## 2011-08-18 DIAGNOSIS — R0602 Shortness of breath: Secondary | ICD-10-CM | POA: Insufficient documentation

## 2011-08-18 DIAGNOSIS — Z5189 Encounter for other specified aftercare: Secondary | ICD-10-CM | POA: Insufficient documentation

## 2011-08-18 DIAGNOSIS — F17200 Nicotine dependence, unspecified, uncomplicated: Secondary | ICD-10-CM | POA: Insufficient documentation

## 2011-08-18 DIAGNOSIS — J4489 Other specified chronic obstructive pulmonary disease: Secondary | ICD-10-CM | POA: Insufficient documentation

## 2011-08-22 ENCOUNTER — Telehealth: Payer: Self-pay | Admitting: Pulmonary Disease

## 2011-08-22 NOTE — Telephone Encounter (Signed)
I spoke with pt and advised ehr we did not have any samples. Pt verbalized understanding and had no questions

## 2011-08-23 ENCOUNTER — Encounter (HOSPITAL_COMMUNITY): Payer: Medicare Other

## 2011-08-25 ENCOUNTER — Encounter (HOSPITAL_COMMUNITY): Payer: Medicare Other

## 2011-08-30 ENCOUNTER — Encounter (HOSPITAL_COMMUNITY): Payer: Medicare Other

## 2011-09-01 ENCOUNTER — Encounter (HOSPITAL_COMMUNITY): Payer: Medicare Other

## 2011-09-06 ENCOUNTER — Encounter (HOSPITAL_COMMUNITY)
Admission: RE | Admit: 2011-09-06 | Discharge: 2011-09-06 | Disposition: A | Discharge status: Home / Self Care | Payer: Medicare Other | Source: Ambulatory Visit | Attending: Pulmonary Disease | Admitting: Pulmonary Disease

## 2011-09-06 NOTE — Progress Notes (Signed)
Completed home exercise with patient. Discussed exercise progression, type of exercises patient can do at home safely, her routine, RPE/Dyspnea scales, the importance of having a home pulse oximeter and how to use one, environmental factors to watch for when exercising, warning signs and symptoms, when to call MD and CP. Patients' long term goal is to be able to accomplish more ADLs without becoming short of breath.  Patient voices understanding to Home Exercise Regimen. Will continue to encourage and support.

## 2011-09-08 ENCOUNTER — Encounter (HOSPITAL_COMMUNITY): Payer: Medicare Other

## 2011-09-13 ENCOUNTER — Encounter (HOSPITAL_COMMUNITY): Payer: Medicare Other

## 2011-09-15 ENCOUNTER — Encounter (HOSPITAL_COMMUNITY): Payer: Medicare Other

## 2011-09-20 ENCOUNTER — Encounter (HOSPITAL_COMMUNITY): Payer: Medicare Other

## 2011-09-21 NOTE — Progress Notes (Signed)
Mrs Neale came to Rehab area today to discuss dropping from program for 4-6 weeks.  She is going to return to work temporarily.  We discussed keeping up with her home exercise, getting proper rest and continue to work on smoking cessation.  We will readmit when this temporary situation ends.

## 2011-09-22 ENCOUNTER — Encounter (HOSPITAL_COMMUNITY)
Admission: RE | Admit: 2011-09-22 | Discharge: 2011-09-22 | Disposition: A | Discharge status: Home / Self Care | Payer: Medicare Other | Source: Ambulatory Visit | Attending: Pulmonary Disease | Admitting: Pulmonary Disease

## 2011-09-22 DIAGNOSIS — J4489 Other specified chronic obstructive pulmonary disease: Secondary | ICD-10-CM | POA: Insufficient documentation

## 2011-09-22 DIAGNOSIS — R0602 Shortness of breath: Secondary | ICD-10-CM | POA: Insufficient documentation

## 2011-09-22 DIAGNOSIS — Z5189 Encounter for other specified aftercare: Secondary | ICD-10-CM | POA: Insufficient documentation

## 2011-09-22 DIAGNOSIS — F17200 Nicotine dependence, unspecified, uncomplicated: Secondary | ICD-10-CM | POA: Insufficient documentation

## 2011-09-22 DIAGNOSIS — J449 Chronic obstructive pulmonary disease, unspecified: Secondary | ICD-10-CM | POA: Insufficient documentation

## 2011-09-23 ENCOUNTER — Encounter: Payer: Self-pay | Admitting: Pulmonary Disease

## 2011-09-23 ENCOUNTER — Ambulatory Visit (INDEPENDENT_AMBULATORY_CARE_PROVIDER_SITE_OTHER): Payer: Medicare Other | Admitting: Pulmonary Disease

## 2011-09-23 VITALS — BP 116/60 | HR 72 | Temp 98.8°F | Ht 60.0 in | Wt 171.0 lb

## 2011-09-23 DIAGNOSIS — J449 Chronic obstructive pulmonary disease, unspecified: Secondary | ICD-10-CM

## 2011-09-23 DIAGNOSIS — J4489 Other specified chronic obstructive pulmonary disease: Secondary | ICD-10-CM

## 2011-09-23 MED ORDER — PREDNISONE 10 MG PO TABS: ORAL_TABLET | ORAL | Status: DC

## 2011-09-23 NOTE — Progress Notes (Signed)
Addended by: Salli Quarry on: 09/23/2011 02:43 PM   Modules accepted: Orders

## 2011-09-23 NOTE — Assessment & Plan Note (Signed)
The patient has been compliant with her bronchodilators, but unfortunately has continued to smoke.  She is having increased shortness of breath most recently, but is not bringing up purulent mucus.  I would like to treat her with a short course of prednisone, but hold antibiotics for now.  However, she is to call me if she begins to bring up purulent mucus so that we can call in an antibiotic for her.  I have stressed to her again the need to stop smoking, and to get back into pulmonary rehabilitation as soon as possible.

## 2011-09-23 NOTE — Patient Instructions (Signed)
Will treat with a short course of prednisone Can try tylenol cold and sinus for 4-5 days to see if it helps your cold symptoms Try and stop smoking, and get back in pulmonary rehab as soon as your work schedule allows.  Keep followup apptm with me, but call if you begin to cough up nasty mucus.

## 2011-09-23 NOTE — Progress Notes (Signed)
  Subjective:    Patient ID: Holly [name], female    DOB: 1946-03-25, 65 y.o.   MRN: [id number]  HPI The patient comes in today for an acute sick visit.  She has known significant emphysema, and has been counseled on smoking cessation and referred to pulmonary rehabilitation.  She has been compliant with her bronchodilator regimen, but unfortunately continues to smoke.  She notes a few day history of increasing chest tightness, wheezing, and increasing shortness of breath.  She's had a sore throat, and a cough that is dry.  She has had no fevers, chills, or sweats.  The patient had been going to pulmonary rehabilitation, but had to stop because of her work schedule.   Review of Systems  Constitutional: Negative for fever and unexpected weight change.  HENT: Positive for congestion, sore throat and trouble swallowing. Negative for ear pain, nosebleeds, rhinorrhea, sneezing, dental problem, postnasal drip and sinus pressure.   Eyes: Negative for redness and itching.  Respiratory: Positive for cough, shortness of breath and wheezing. Negative for chest tightness.   Cardiovascular: Negative for palpitations and leg swelling.  Gastrointestinal: Negative for nausea and vomiting.  Genitourinary: Negative for dysuria.  Musculoskeletal: Negative for joint swelling.  Skin: Negative for rash.  Neurological: Negative for headaches.  Hematological: Does not bruise/bleed easily.  Psychiatric/Behavioral: Negative for dysphoric mood. The patient is not nervous/anxious.        Objective:   Physical Exam Overweight female in no acute distress Nose without purulence or discharge noted Oropharynx without erythema or exudates. Chest with decreased breath sounds, a few wheezes, prominent upper airway pseudo wheezing. Cardiac exam with regular rate and rhythm Lower extremities without edema, no cyanosis noted Alert and oriented, moves all 4 extremities.       Assessment & Plan:

## 2011-09-26 NOTE — Progress Notes (Signed)
Patient being discharged at this time due to non compliance with attendance. RN spoke with patient and they decided to drop program at this time and will resume rehab at a later time. All paperwork has been sent to scan center. Patient did not have any post documentation.

## 2011-09-27 ENCOUNTER — Encounter (HOSPITAL_COMMUNITY)
Admission: RE | Admit: 2011-09-27 | Discharge: 2011-09-27 | Payer: Medicare Other | Source: Ambulatory Visit | Attending: Pulmonary Disease | Admitting: Pulmonary Disease

## 2011-09-29 ENCOUNTER — Encounter (HOSPITAL_COMMUNITY): Payer: Medicare Other

## 2011-10-04 ENCOUNTER — Encounter (HOSPITAL_COMMUNITY): Payer: Medicare Other

## 2011-10-06 ENCOUNTER — Encounter (HOSPITAL_COMMUNITY): Payer: Medicare Other

## 2011-10-11 ENCOUNTER — Encounter (HOSPITAL_COMMUNITY): Payer: Medicare Other

## 2011-10-13 ENCOUNTER — Encounter (HOSPITAL_COMMUNITY): Payer: Medicare Other

## 2011-10-18 ENCOUNTER — Encounter (HOSPITAL_COMMUNITY): Payer: Medicare Other

## 2011-10-20 ENCOUNTER — Encounter (HOSPITAL_COMMUNITY): Payer: Medicare Other

## 2011-10-25 ENCOUNTER — Encounter (HOSPITAL_COMMUNITY): Payer: Medicare Other

## 2011-10-27 ENCOUNTER — Encounter (HOSPITAL_COMMUNITY): Payer: Medicare Other

## 2011-11-01 ENCOUNTER — Encounter (HOSPITAL_COMMUNITY): Payer: Medicare Other

## 2011-11-03 ENCOUNTER — Encounter (HOSPITAL_COMMUNITY): Payer: Medicare Other

## 2011-11-08 ENCOUNTER — Encounter (HOSPITAL_COMMUNITY): Payer: Medicare Other

## 2011-11-10 ENCOUNTER — Encounter (HOSPITAL_COMMUNITY): Payer: Medicare Other

## 2011-11-15 ENCOUNTER — Encounter (HOSPITAL_COMMUNITY): Payer: Medicare Other

## 2011-11-17 ENCOUNTER — Encounter (HOSPITAL_COMMUNITY): Payer: Medicare Other

## 2011-11-22 ENCOUNTER — Encounter (HOSPITAL_COMMUNITY): Payer: Medicare Other

## 2011-12-29 ENCOUNTER — Telehealth: Payer: Self-pay | Admitting: Pulmonary Disease

## 2011-12-29 NOTE — Telephone Encounter (Signed)
Called and spoke with pt.  Informed her no samples avail at this time.  Nothing further needed.

## 2011-12-30 ENCOUNTER — Ambulatory Visit: Payer: Medicare Other | Admitting: Pulmonary Disease

## 2012-01-11 ENCOUNTER — Ambulatory Visit: Payer: Medicare Other | Admitting: Pulmonary Disease

## 2012-01-25 ENCOUNTER — Ambulatory Visit: Payer: Medicare Other | Admitting: Pulmonary Disease

## 2012-02-06 ENCOUNTER — Encounter: Payer: Self-pay | Admitting: Pulmonary Disease

## 2012-02-06 ENCOUNTER — Ambulatory Visit (INDEPENDENT_AMBULATORY_CARE_PROVIDER_SITE_OTHER): Payer: Medicare Other | Admitting: Pulmonary Disease

## 2012-02-06 VITALS — BP 130/64 | HR 58 | Temp 98.4°F | Ht 60.0 in | Wt 162.8 lb

## 2012-02-06 DIAGNOSIS — J449 Chronic obstructive pulmonary disease, unspecified: Secondary | ICD-10-CM | POA: Diagnosis not present

## 2012-02-06 NOTE — Progress Notes (Signed)
  Subjective:    Patient ID: Holly [name], female    DOB: April 20, 1946, 66 y.o.   MRN: [id number]  HPI Patient comes in today for followup of her known COPD.  She is staying on her bronchodilator regimen, but unfortunately continues to smoke excessively.  She has a chronic cough with mucous production, but does not feel that her breathing is any worse than usual baseline.   Review of Systems  Constitutional: Negative for fever and unexpected weight change.  HENT: Positive for congestion, rhinorrhea and postnasal drip. Negative for ear pain, nosebleeds, sore throat, sneezing, trouble swallowing, dental problem and sinus pressure.   Eyes: Negative for redness and itching.  Respiratory: Positive for cough, shortness of breath and wheezing. Negative for chest tightness.   Cardiovascular: Negative for palpitations and leg swelling.  Gastrointestinal: Negative for nausea and vomiting.  Genitourinary: Negative for dysuria.  Musculoskeletal: Negative for joint swelling.  Skin: Negative for rash.  Neurological: Negative for headaches.  Hematological: Does not bruise/bleed easily.  Psychiatric/Behavioral: Negative for dysphoric mood. The patient is not nervous/anxious.        Objective:   Physical Exam Obese female in no acute distress Nose without purulence or discharge noted Chest with decreased breath sounds throughout but no wheezing Cardiac exam with regular rate and rhythm Lower extremities without edema, no cyanosis Alert and oriented, moves all 4 extremities.       Assessment & Plan:

## 2012-02-06 NOTE — Assessment & Plan Note (Signed)
The patient has known severe COPD by her PFTs, but unfortunately continues to smoke.  Her current symptoms are near her usual baseline, but she continues to have a cough with mucus production.  I have told her this will be an ongoing issue until she totally quit smoking.  I have asked her to continue with her current bronchodilator regimen, and to work on total smoking cessation.

## 2012-02-06 NOTE — Patient Instructions (Signed)
No change in breathing medications.  Work on total smoking cessation.   followup with me in 6mos, but call if breathing issues worsen.

## 2012-03-02 DIAGNOSIS — Z85828 Personal history of other malignant neoplasm of skin: Secondary | ICD-10-CM | POA: Diagnosis not present

## 2012-03-02 DIAGNOSIS — L820 Inflamed seborrheic keratosis: Secondary | ICD-10-CM | POA: Diagnosis not present

## 2012-03-02 DIAGNOSIS — L578 Other skin changes due to chronic exposure to nonionizing radiation: Secondary | ICD-10-CM | POA: Diagnosis not present

## 2012-03-02 DIAGNOSIS — D692 Other nonthrombocytopenic purpura: Secondary | ICD-10-CM | POA: Diagnosis not present

## 2012-03-20 DIAGNOSIS — N63 Unspecified lump in unspecified breast: Secondary | ICD-10-CM | POA: Diagnosis not present

## 2012-03-20 DIAGNOSIS — Z09 Encounter for follow-up examination after completed treatment for conditions other than malignant neoplasm: Secondary | ICD-10-CM | POA: Diagnosis not present

## 2012-03-30 ENCOUNTER — Telehealth: Payer: Self-pay | Admitting: Pulmonary Disease

## 2012-03-30 NOTE — Telephone Encounter (Signed)
I spoke with pt and is aware we have no samples currently. She voiced her understanding and had no questions

## 2012-04-24 DIAGNOSIS — R634 Abnormal weight loss: Secondary | ICD-10-CM | POA: Diagnosis not present

## 2012-04-24 DIAGNOSIS — E785 Hyperlipidemia, unspecified: Secondary | ICD-10-CM | POA: Diagnosis not present

## 2012-04-24 DIAGNOSIS — R1310 Dysphagia, unspecified: Secondary | ICD-10-CM | POA: Diagnosis not present

## 2012-04-24 DIAGNOSIS — N899 Noninflammatory disorder of vagina, unspecified: Secondary | ICD-10-CM | POA: Diagnosis not present

## 2012-04-24 DIAGNOSIS — R3915 Urgency of urination: Secondary | ICD-10-CM | POA: Diagnosis not present

## 2012-04-24 DIAGNOSIS — F329 Major depressive disorder, single episode, unspecified: Secondary | ICD-10-CM | POA: Diagnosis not present

## 2012-04-24 DIAGNOSIS — I10 Essential (primary) hypertension: Secondary | ICD-10-CM | POA: Diagnosis not present

## 2012-05-01 DIAGNOSIS — H2510 Age-related nuclear cataract, unspecified eye: Secondary | ICD-10-CM | POA: Diagnosis not present

## 2012-05-01 DIAGNOSIS — H5200 Hypermetropia, unspecified eye: Secondary | ICD-10-CM | POA: Diagnosis not present

## 2012-05-09 ENCOUNTER — Other Ambulatory Visit (HOSPITAL_COMMUNITY): Payer: Self-pay | Admitting: Family Medicine

## 2012-05-15 ENCOUNTER — Encounter (HOSPITAL_COMMUNITY): Payer: Medicare Other

## 2012-06-12 DIAGNOSIS — R1310 Dysphagia, unspecified: Secondary | ICD-10-CM | POA: Diagnosis not present

## 2012-06-12 DIAGNOSIS — Z1211 Encounter for screening for malignant neoplasm of colon: Secondary | ICD-10-CM | POA: Diagnosis not present

## 2012-06-15 ENCOUNTER — Emergency Department (HOSPITAL_COMMUNITY)
Admission: EM | Admit: 2012-06-15 | Discharge: 2012-06-16 | Disposition: A | Discharge status: Home / Self Care | Payer: Medicare Other | Attending: Emergency Medicine | Admitting: Emergency Medicine

## 2012-06-15 ENCOUNTER — Encounter (HOSPITAL_COMMUNITY): Payer: Self-pay | Admitting: Emergency Medicine

## 2012-06-15 DIAGNOSIS — F17200 Nicotine dependence, unspecified, uncomplicated: Secondary | ICD-10-CM | POA: Diagnosis not present

## 2012-06-15 DIAGNOSIS — I10 Essential (primary) hypertension: Secondary | ICD-10-CM | POA: Diagnosis not present

## 2012-06-15 DIAGNOSIS — E785 Hyperlipidemia, unspecified: Secondary | ICD-10-CM | POA: Diagnosis not present

## 2012-06-15 DIAGNOSIS — R040 Epistaxis: Secondary | ICD-10-CM

## 2012-06-15 DIAGNOSIS — Z7982 Long term (current) use of aspirin: Secondary | ICD-10-CM | POA: Diagnosis not present

## 2012-06-15 LAB — CBC WITH DIFFERENTIAL/PLATELET
Basophils Absolute: 0 10*3/uL (ref 0.0–0.1)
Basophils Relative: 0 % (ref 0–1)
Eosinophils Absolute: 0.3 10*3/uL (ref 0.0–0.7)
Eosinophils Relative: 2 % (ref 0–5)
HCT: 44.3 % (ref 36.0–46.0)
Hemoglobin: 15 g/dL (ref 12.0–15.0)
Lymphocytes Relative: 16 % (ref 12–46)
Lymphs Abs: 1.8 10*3/uL (ref 0.7–4.0)
MCH: 32 pg (ref 26.0–34.0)
MCHC: 33.9 g/dL (ref 30.0–36.0)
MCV: 94.5 fL (ref 78.0–100.0)
Monocytes Absolute: 0.8 10*3/uL (ref 0.1–1.0)
Monocytes Relative: 8 % (ref 3–12)
Neutro Abs: 8.1 10*3/uL — ABNORMAL HIGH (ref 1.7–7.7)
Neutrophils Relative %: 74 % (ref 43–77)
Platelets: 192 10*3/uL (ref 150–400)
RBC: 4.69 MIL/uL (ref 3.87–5.11)
RDW: 14.8 % (ref 11.5–15.5)
WBC: 11 10*3/uL — ABNORMAL HIGH (ref 4.0–10.5)

## 2012-06-15 LAB — COMPREHENSIVE METABOLIC PANEL
ALT: 12 U/L (ref 0–35)
AST: 16 U/L (ref 0–37)
Albumin: 3.7 g/dL (ref 3.5–5.2)
Alkaline Phosphatase: 96 U/L (ref 39–117)
BUN: 16 mg/dL (ref 6–23)
CO2: 29 mEq/L (ref 19–32)
Calcium: 10.2 mg/dL (ref 8.4–10.5)
Chloride: 99 mEq/L (ref 96–112)
Creatinine, Ser: 0.94 mg/dL (ref 0.50–1.10)
GFR calc Af Amer: 71 mL/min — ABNORMAL LOW (ref >90)
GFR calc non Af Amer: 61 mL/min — ABNORMAL LOW (ref >90)
Glucose, Bld: 124 mg/dL — ABNORMAL HIGH (ref 70–99)
Potassium: 3.6 mEq/L (ref 3.5–5.1)
Sodium: 137 mEq/L (ref 135–145)
Total Bilirubin: 0.3 mg/dL (ref 0.3–1.2)
Total Protein: 7 g/dL (ref 6.0–8.3)

## 2012-06-15 NOTE — ED Notes (Signed)
Report given via EMS. Pt c/o excessive epistaxis and left eye bleed that started at 2030. EMS gave Afrin during transport. Pt takes 81 mg of Aspirin daily. Denies pain, headache. Medications from home at bedside. Hx COPD, HTN, high cholesterol. Initial VS BP 140/70 O2 98% Pulse 84 at 2125. Pt has blood noted on shirt and is currently applying pressure on appearance. Denies trauma.

## 2012-06-15 NOTE — ED Notes (Signed)
Denies hx of stroke and denies hx of family stroke.

## 2012-06-16 MED ORDER — AMOXICILLIN-POT CLAVULANATE 875-125 MG PO TABS: 1.0000 | ORAL_TABLET | Freq: Two times a day (BID) | ORAL | Status: AC

## 2012-06-16 NOTE — ED Notes (Signed)
Pt received nose packing via MD.

## 2012-06-16 NOTE — ED Notes (Signed)
MD at bedside. 

## 2012-06-16 NOTE — ED Provider Notes (Signed)
History     CSN: [id number]  Arrival date & time 06/15/12  2147   First MD Initiated Contact with Patient 06/15/12 2307      Chief Complaint  Patient presents with  . Epistaxis    (Consider location/radiation/quality/duration/timing/severity/associated sxs/prior treatment) HPI L sided. Onset tonight, denies trauma or FB to nose. No recent cough, cold, sneezing or congestion, has allergies with on/ off symptoms.  No blood thinners, bleeding not controlled at home.  No associated emesis. denes h/o same. Mod in severity.  Past Medical History  Diagnosis Date  . Osteopenia   . Urinary incontinence   . Anxiety   . Cervicalgia   . Hypertension   . Hypercalcemia   . Mixed hyperlipidemia   . Basal cell carcinoma     Past Surgical History  Procedure Date  . Vaginal hysterectomy     Family History  Problem Relation Age of Onset  . Coronary artery disease Father   . Hypertension Father   . Asthma Father   . Allergies Father   . Hypertension Mother   . Lung cancer Mother     History  Substance Use Topics  . Smoking status: Current Everyday Smoker -- 1.0 packs/day for 20 years  . Smokeless tobacco: Not on file  . Alcohol Use: Not on file    OB History    Grav Para Term Preterm Abortions TAB SAB Ect Mult Living                  Review of Systems  Constitutional: Negative for fever and chills.  HENT: Negative for congestion, rhinorrhea, sneezing, neck pain, neck stiffness and sinus pressure.   Eyes: Negative for pain.  Respiratory: Negative for shortness of breath.   Cardiovascular: Negative for chest pain.  Gastrointestinal: Negative for abdominal pain.  Genitourinary: Negative for dysuria.  Musculoskeletal: Negative for back pain.  Skin: Negative for rash.  Neurological: Negative for headaches.  All other systems reviewed and are negative.    Allergies  Codeine  Home Medications   Current Outpatient Rx  Name Route Sig Dispense Refill  . ALBUTEROL  SULFATE HFA 108 (90 BASE) MCG/ACT IN AERS Inhalation Inhale 2 puffs into the lungs every 6 (six) hours as needed.      Marland Kitchen AMLODIPINE BESY-BENAZEPRIL HCL 5-10 MG PO CAPS Oral Take 1 capsule by mouth daily.      . ASPIRIN 81 MG PO TABS Oral Take 81 mg by mouth daily.      . BUDESONIDE-FORMOTEROL FUMARATE 160-4.5 MCG/ACT IN AERO Inhalation Inhale 2 puffs into the lungs 2 (two) times daily.      . BUSPIRONE HCL 30 MG PO TABS Oral Take 30 mg by mouth 2 (two) times daily.      Marland Kitchen PRAVASTATIN SODIUM 40 MG PO TABS Oral Take 40 mg by mouth daily.      Marland Kitchen TIOTROPIUM BROMIDE MONOHYDRATE 18 MCG IN CAPS Inhalation Place 18 mcg into inhaler and inhale daily.     Marland Kitchen ZOLPIDEM TARTRATE 10 MG PO TABS Oral Take 5 mg by mouth at bedtime as needed. Insomnia      BP 140/73  Pulse 73  Temp 99.2 F (37.3 C) (Oral)  Resp 16  SpO2 95%  Physical Exam  Constitutional: She is oriented to person, place, and time. She appears well-developed and well-nourished.  HENT:  Head: Normocephalic and atraumatic.       Mild oozing from L nare, unable to visualize bleed site.   Eyes: Conjunctivae and EOM  are normal. Pupils are equal, round, and reactive to light.  Neck: Trachea normal. Neck supple. No thyromegaly present.  Cardiovascular: Normal rate, regular rhythm, S1 normal, S2 normal and normal pulses.     No systolic murmur is present   No diastolic murmur is present  Pulses:      Radial pulses are 2+ on the right side, and 2+ on the left side.  Pulmonary/Chest: Effort normal and breath sounds normal. She has no wheezes. She has no rhonchi. She has no rales. She exhibits no tenderness.  Abdominal: Soft. Normal appearance and bowel sounds are normal. There is no tenderness. There is no CVA tenderness and negative Murphy's sign.  Musculoskeletal:       BLE:s Calves nontender, no cords or erythema, negative Homans sign  Neurological: She is alert and oriented to person, place, and time. She has normal strength. No cranial  nerve deficit or sensory deficit. GCS eye subscore is 4. GCS verbal subscore is 5. GCS motor subscore is 6.  Skin: Skin is warm and dry. No rash noted. She is not diaphoretic.  Psychiatric: Her speech is normal.       Cooperative and appropriate    ED Course  EPISTAXIS MANAGEMENT Date/Time: 06/15/2012 7:50 PM Performed by: Sunnie Nielsen Authorized by: Sunnie Nielsen Risks and benefits: risks, benefits and alternatives were discussed Consent given by: patient Patient understanding: patient states understanding of the procedure being performed Patient consent: the patient's understanding of the procedure matches consent given Procedure consent: procedure consent matches procedure scheduled Required items: required blood products, implants, devices, and special equipment available Patient identity confirmed: verbally with patient Time out: Immediately prior to procedure a "time out" was called to verify the correct patient, procedure, equipment, support staff and site/side marked as required. Preparation: Patient was prepped and draped in the usual sterile fashion. Treatment site: left posterior Repair method: merocel sponge Post-procedure assessment: bleeding stopped Treatment complexity: complex Patient tolerance: Patient tolerated the procedure well with no immediate complications. Comments: Nose blown and neosynephrine applied.  Recheck min bleeding without visualized bleed site.  Discussed options and PT opted for nasal packing. Bacitracin and packing applied, on recheck after 30 min, bleeding stopped.    (including critical care time)  Results for orders placed during the hospital encounter of 06/15/12  CBC WITH DIFFERENTIAL      Component Value Range   WBC 11.0 (*) 4.0 - 10.5 K/uL   RBC 4.69  3.87 - 5.11 MIL/uL   Hemoglobin 15.0  12.0 - 15.0 g/dL   HCT 16.1  09.6 - 04.5 %   MCV 94.5  78.0 - 100.0 fL   MCH 32.0  26.0 - 34.0 pg   MCHC 33.9  30.0 - 36.0 g/dL   RDW 40.9  81.1 -  91.4 %   Platelets 192  150 - 400 K/uL   Neutrophils Relative 74  43 - 77 %   Neutro Abs 8.1 (*) 1.7 - 7.7 K/uL   Lymphocytes Relative 16  12 - 46 %   Lymphs Abs 1.8  0.7 - 4.0 K/uL   Monocytes Relative 8  3 - 12 %   Monocytes Absolute 0.8  0.1 - 1.0 K/uL   Eosinophils Relative 2  0 - 5 %   Eosinophils Absolute 0.3  0.0 - 0.7 K/uL   Basophils Relative 0  0 - 1 %   Basophils Absolute 0.0  0.0 - 0.1 K/uL  COMPREHENSIVE METABOLIC PANEL      Component Value Range  Sodium 137  135 - 145 mEq/L   Potassium 3.6  3.5 - 5.1 mEq/L   Chloride 99  96 - 112 mEq/L   CO2 29  19 - 32 mEq/L   Glucose, Bld 124 (*) 70 - 99 mg/dL   BUN 16  6 - 23 mg/dL   Creatinine, Ser 1.61  0.50 - 1.10 mg/dL   Calcium 09.6  8.4 - 04.5 mg/dL   Total Protein 7.0  6.0 - 8.3 g/dL   Albumin 3.7  3.5 - 5.2 g/dL   AST 16  0 - 37 U/L   ALT 12  0 - 35 U/L   Alkaline Phosphatase 96  39 - 117 U/L   Total Bilirubin 0.3  0.3 - 1.2 mg/dL   GFR calc non Af Amer 61 (*) >90 mL/min   GFR calc Af Amer 71 (*) >90 mL/min     EPISTAXIS.   Labs obtained - Normal platelets and H/H  ENT referral and plan f/u for packing removal and further evaluation.  RX Augmentin provided. Strict return precautions verbalized as understood.  MDM   VS and nursing notes reviewed. L nasal packing placed. Labs reviewed.Serial evaluations         Sunnie Nielsen, MD 06/19/12 [phone]

## 2012-06-20 DIAGNOSIS — R040 Epistaxis: Secondary | ICD-10-CM | POA: Diagnosis not present

## 2012-07-13 DIAGNOSIS — Z1211 Encounter for screening for malignant neoplasm of colon: Secondary | ICD-10-CM | POA: Diagnosis not present

## 2012-07-13 DIAGNOSIS — R1310 Dysphagia, unspecified: Secondary | ICD-10-CM | POA: Diagnosis not present

## 2012-07-13 DIAGNOSIS — K621 Rectal polyp: Secondary | ICD-10-CM | POA: Diagnosis not present

## 2012-07-13 DIAGNOSIS — R634 Abnormal weight loss: Secondary | ICD-10-CM | POA: Diagnosis not present

## 2012-07-13 DIAGNOSIS — K319 Disease of stomach and duodenum, unspecified: Secondary | ICD-10-CM | POA: Diagnosis not present

## 2012-07-13 DIAGNOSIS — K620 Anal polyp: Secondary | ICD-10-CM | POA: Diagnosis not present

## 2012-07-13 DIAGNOSIS — K5730 Diverticulosis of large intestine without perforation or abscess without bleeding: Secondary | ICD-10-CM | POA: Diagnosis not present

## 2012-07-13 DIAGNOSIS — D126 Benign neoplasm of colon, unspecified: Secondary | ICD-10-CM | POA: Diagnosis not present

## 2012-08-07 ENCOUNTER — Ambulatory Visit: Payer: Medicare Other | Admitting: Pulmonary Disease

## 2012-08-28 ENCOUNTER — Encounter: Payer: Self-pay | Admitting: Pulmonary Disease

## 2012-08-28 ENCOUNTER — Ambulatory Visit (INDEPENDENT_AMBULATORY_CARE_PROVIDER_SITE_OTHER): Payer: Medicare Other | Admitting: Pulmonary Disease

## 2012-08-28 VITALS — BP 110/80 | HR 72 | Temp 98.1°F | Ht 60.0 in | Wt 158.0 lb

## 2012-08-28 DIAGNOSIS — Z23 Encounter for immunization: Secondary | ICD-10-CM

## 2012-08-28 DIAGNOSIS — J449 Chronic obstructive pulmonary disease, unspecified: Secondary | ICD-10-CM | POA: Diagnosis not present

## 2012-08-28 NOTE — Progress Notes (Signed)
  Subjective:    Patient ID: Holly [name], female    DOB: Dec 29, 1945, 66 y.o.   MRN: [id number]  HPI Patient comes in today for followup of her known COPD.  She is on a very aggressive bronchodilator regimen, and feels that she has done much better with this.  She has had no recent acute exacerbation of pulmonary infection.  Unfortunately she continues to smoke, but is seriously considering trying harder to quit.  She has been staying active, and feels that her exertional tolerance is actually better the last 6 months.   Review of Systems  Constitutional: Negative for fever and unexpected weight change.  HENT: Positive for congestion and rhinorrhea. Negative for ear pain, nosebleeds, sore throat, sneezing, trouble swallowing, dental problem, postnasal drip and sinus pressure.   Eyes: Negative for redness and itching.  Respiratory: Negative for cough, chest tightness, shortness of breath and wheezing.   Cardiovascular: Negative for palpitations and leg swelling.  Gastrointestinal: Negative for nausea and vomiting.  Genitourinary: Negative for dysuria.  Musculoskeletal: Negative for joint swelling.  Skin: Negative for rash.  Neurological: Negative for headaches.  Hematological: Bruises/bleeds easily.  Psychiatric/Behavioral: Negative for dysphoric mood. The patient is not nervous/anxious.        Objective:   Physical Exam Overweight female in no acute distress Nose without purulence or discharge noted Chest with decreased breath sounds, but no wheezes or rhonchi Cardiac exam with regular rate and rhythm Lower extremities without edema, no cyanosis Alert and oriented, moves all 4 extremities.       Assessment & Plan:

## 2012-08-28 NOTE — Patient Instructions (Addendum)
No change in medications. Try real hard to quit smoking. Will give you the flu shot today followup with me in 6mos.

## 2012-08-28 NOTE — Assessment & Plan Note (Signed)
The pt is stable on her current BD regimen.  She has not had a recent pulmonary infection or acute exacerbation.  I have asked her to continue on her meds, and to try to totally quit smoking.  Will give her the flu shot today, and she will followup with me in 6mos.

## 2012-10-05 DIAGNOSIS — J441 Chronic obstructive pulmonary disease with (acute) exacerbation: Secondary | ICD-10-CM | POA: Diagnosis not present

## 2012-10-05 DIAGNOSIS — J069 Acute upper respiratory infection, unspecified: Secondary | ICD-10-CM | POA: Diagnosis not present

## 2012-10-15 DIAGNOSIS — J329 Chronic sinusitis, unspecified: Secondary | ICD-10-CM | POA: Diagnosis not present

## 2012-10-15 DIAGNOSIS — J449 Chronic obstructive pulmonary disease, unspecified: Secondary | ICD-10-CM | POA: Diagnosis not present

## 2013-01-23 DIAGNOSIS — I10 Essential (primary) hypertension: Secondary | ICD-10-CM | POA: Diagnosis not present

## 2013-01-23 DIAGNOSIS — F411 Generalized anxiety disorder: Secondary | ICD-10-CM | POA: Diagnosis not present

## 2013-01-23 DIAGNOSIS — F17200 Nicotine dependence, unspecified, uncomplicated: Secondary | ICD-10-CM | POA: Diagnosis not present

## 2013-01-23 DIAGNOSIS — E785 Hyperlipidemia, unspecified: Secondary | ICD-10-CM | POA: Diagnosis not present

## 2013-01-23 DIAGNOSIS — J449 Chronic obstructive pulmonary disease, unspecified: Secondary | ICD-10-CM | POA: Diagnosis not present

## 2013-01-24 ENCOUNTER — Other Ambulatory Visit (HOSPITAL_COMMUNITY): Payer: Self-pay | Admitting: Family Medicine

## 2013-02-06 ENCOUNTER — Encounter (HOSPITAL_COMMUNITY)
Admission: RE | Admit: 2013-02-06 | Discharge: 2013-02-06 | Disposition: A | Discharge status: Home / Self Care | Payer: Medicare Other | Source: Ambulatory Visit | Attending: Family Medicine | Admitting: Family Medicine

## 2013-02-06 DIAGNOSIS — E215 Disorder of parathyroid gland, unspecified: Secondary | ICD-10-CM | POA: Diagnosis not present

## 2013-02-06 IMAGING — NM NM PARATHYROID W/ SPECT
4 series · 24 of 24 positions shown · non-contrast
Comparison: None

CLINICAL DATA: Hypercalcemia and elevated PTH.

NM PARATHYROID SCINTIGRAPHY AND SPECT IMAGING
TECHNIQUE: Following intravenous administration of
radiopharmaceutical, early and 2-hour delayed planar images were
obtained in the anterior projection.  Delayed triplanar SPECT
images were also obtained at 2 hours.
Radiopharmaceutical:  25 mCi [EL] Sestamibi IV

[Series 0: parathyroid · 4.7mm · 4.66mm/px · 6 of 78 frames shown (1 of 4)]
[frame 7/78]
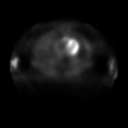
[frame 20/78]
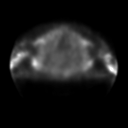
[frame 33/78]
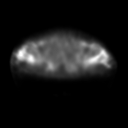
[frame 46/78]
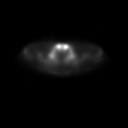
[frame 59/78]
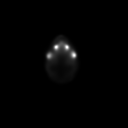
[frame 72/78]
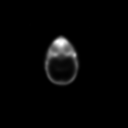

[Series 0: parathyroid · 4.66mm/px · 6 of 64 frames shown (2 of 4)]
[frame 6/64]
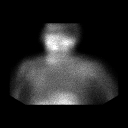
[frame 16/64]
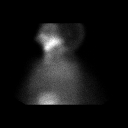
[frame 27/64]
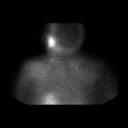
[frame 38/64]
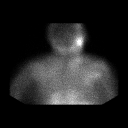
[frame 48/64]
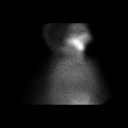
[frame 59/64]
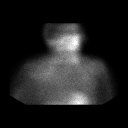

[Series 0: parathyroid · 4.7mm · 4.66mm/px · 6 of 88 frames shown (3 of 4)]
[frame 8/88]
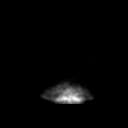
[frame 22/88]
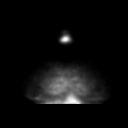
[frame 37/88]
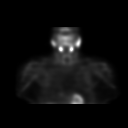
[frame 52/88]
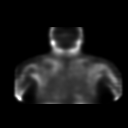
[frame 66/88]
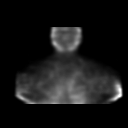
[frame 81/88]
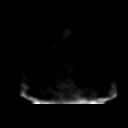

[Series 0: parathyroid · 4.7mm · 4.66mm/px · 6 of 91 frames shown (4 of 4)]
[frame 8/91]
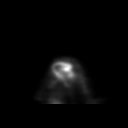
[frame 23/91]
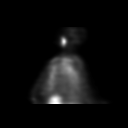
[frame 38/91]
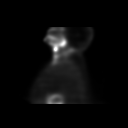
[frame 53/91]
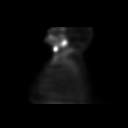
[frame 68/91]
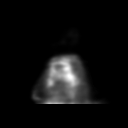
[frame 84/91]
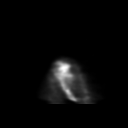

[24 of 24 positions shown; findings below may reference images not displayed]

FINDINGS: No definite areas of abnormal activity to suggest
parathyroid adenoma.
IMPRESSION: Negative for parathyroid adenoma.

## 2013-02-06 MED ORDER — TECHNETIUM TC 99M SESTAMIBI GENERIC - CARDIOLITE
25.0000 | Freq: Once | INTRAVENOUS | Status: AC | PRN
  Administered 2013-02-06: 25 via INTRAVENOUS

## 2013-02-07 ENCOUNTER — Encounter (HOSPITAL_COMMUNITY): Payer: Medicare Other

## 2013-02-11 ENCOUNTER — Ambulatory Visit (INDEPENDENT_AMBULATORY_CARE_PROVIDER_SITE_OTHER): Payer: Medicare Other | Admitting: Internal Medicine

## 2013-02-11 ENCOUNTER — Encounter: Payer: Self-pay | Admitting: Internal Medicine

## 2013-02-11 VITALS — BP 142/82 | HR 80 | Temp 98.2°F | Ht 60.0 in | Wt 156.2 lb

## 2013-02-11 DIAGNOSIS — R600 Localized edema: Secondary | ICD-10-CM | POA: Insufficient documentation

## 2013-02-11 DIAGNOSIS — J441 Chronic obstructive pulmonary disease with (acute) exacerbation: Secondary | ICD-10-CM | POA: Diagnosis not present

## 2013-02-11 DIAGNOSIS — R609 Edema, unspecified: Secondary | ICD-10-CM

## 2013-02-11 MED ORDER — DOXYCYCLINE HYCLATE 100 MG PO TABS: 100.0000 mg | ORAL_TABLET | Freq: Two times a day (BID) | ORAL | Status: DC

## 2013-02-11 MED ORDER — LOSARTAN POTASSIUM 50 MG PO TABS: 50.0000 mg | ORAL_TABLET | Freq: Every day | ORAL | Status: DC

## 2013-02-11 MED ORDER — PREDNISONE 10 MG PO TABS: ORAL_TABLET | ORAL | Status: DC

## 2013-02-11 NOTE — Assessment & Plan Note (Signed)
#  Leg swelling - This is probably related to amlodipine. Please talk to primary care physician [name], MD and have this stopped

## 2013-02-11 NOTE — Patient Instructions (Addendum)
#  COPD exacerbation  You have mild attack of copd called COPD exacerbation I think  you blood pressure medication BENAZPRIL is causing this Please stop the BENAZPRIL. INSTEAD take losartan 50 mg once daily Please take doxycycline 100mg  twice daily after meals x 5 days; avoid sunlight Take prednisone 40 mg daily x 2 days, then 20mg  daily x 2 days, then 10mg  daily x 2 days, then stop  #Leg swelling - This is probably related to amlodipine. Please talk to primary care physician [name], MD and have this stopped  #Followup - 3-4 weeks with either Tammy Parrott or Dr Marcelyn Bruins - Spirometry at followup

## 2013-02-11 NOTE — Assessment & Plan Note (Signed)
#  COPD exacerbation  You have mild attack of copd called COPD exacerbation I think  you blood pressure medication BENAZPRIL is causing this Please stop the BENAZPRIL. INSTEAD take losartan 50 mg once daily Please take doxycycline 100mg  twice daily after meals x 5 days; avoid sunlight Take prednisone 40 mg daily x 2 days, then 20mg  daily x 2 days, then 10mg  daily x 2 days, then stop   #Followup - 3-4 weeks with either Tammy Parrott or Dr Marcelyn Bruins - Spirometry at followup

## 2013-02-11 NOTE — Progress Notes (Signed)
Subjective:    Patient ID: Holly [name], female    DOB: 1946-07-07, 67 y.o.   MRN: [id number]  HPI  OV 08/28/12 Patient comes in today for followup of her known COPD.  She is on a very aggressive bronchodilator regimen, and feels that she has done much better with this.  She has had no recent acute exacerbation of pulmonary infection.  Unfortunately she continues to smoke, but is seriously considering trying harder to quit.  She has been staying active, and feels that her exertional tolerance is actually better the last 6 months.   No change in medications.  Try real hard to quit smoking.  Will give you the flu shot today  followup with me in 6mos.     OV 02/11/2013 PCP Holly Bradford, MD Pulmonar - Dr Holly [name]  IS A PATIENT WITH COPD NOT OTHERWISE SPECIFIED. SHE IS NOT ON HOME OXYGEN AT BASELINE AND APPEARS TO BE ABLE TO DO LAST 2 ACTIVITIES . In addition she's a smoker   - She presents acutely today 02/11/2013. According to her history 7-10 days ago primary care physicians that changed her antihypertensives for unclear reasons. Her new antihypertensive is unknown but current antihypertensive therapy according to the pharmacy is the natural, hydrochlorothiazide, amlodipine and bisoprolol. According to the patient she is compliant with her current regimen   some 2-3 days after BP med change she started noticing ankle edema new-onset acute and mild. This was associated with new onset of shortness of breath that is since worsened and is progressive. Severity is moderate to mild. Dyspnea brought on by exertion and relieved by rest. Worsening dyspnea is associated with increased wheezing and mouth breathing but no increase in associated cough or sputum production. Denies chest pain, hemoptysis, fever or weight loss or malaise     CAT COPD Symptom & Quality of Life Score ([employer] trademark) 0 is no burden. 5 is highest burden 02/11/2013   Never Cough -> Cough all  the time 2  No phlegm in chest -> Chest is full of phlegm 2  No chest tightness -> Chest feels very tight 1  No dyspnea for 1 flight stairs/hill -> Very dyspneic for 1 flight of stairs 3  No limitations for ADL at home -> Very limited with ADL at home 2  Confident leaving home -> Not at all confident leaving home 3  Sleep soundly -> Do not sleep soundly because of lung condition 2  Lots of Energy -> No energy at all 3  TOTAL Score (max 40)  18    Past, Family, Social reviewed: no change since last visit other than as in HPI - bp med changed. 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     Review of Systems  Constitutional: Negative for fever and unexpected weight change.  HENT: Negative for ear pain, nosebleeds, congestion, sore throat, rhinorrhea, sneezing, trouble swallowing, dental problem, postnasal drip and sinus pressure.   Eyes: Negative for redness and itching.  Respiratory: Positive for shortness of breath. Negative for cough, chest tightness and wheezing.   Cardiovascular: Positive for leg swelling. Negative for palpitations.  Gastrointestinal: Negative for nausea and vomiting.  Genitourinary: Negative for dysuria.  Musculoskeletal: Negative for joint swelling.  Skin: Negative for rash.  Neurological: Negative for headaches.  Hematological: Does not bruise/bleed easily.  Psychiatric/Behavioral: Negative for dysphoric mood. The patient is not nervous/anxious.    Current outpatient prescriptions:albuterol (PROVENTIL HFA;VENTOLIN HFA) 108 (90 BASE) MCG/ACT inhaler, Inhale 2 puffs into the  lungs every 6 (six) hours as needed.  , Disp: , Rfl: ;  amLODipine-benazepril (LOTREL) 5-10 MG per capsule, Take 1 capsule by mouth daily.  , Disp: , Rfl: ;  aspirin 81 MG tablet, Take 81 mg by mouth daily.  , Disp: , Rfl:  budesonide-formoterol (SYMBICORT) 160-4.5 MCG/ACT inhaler, Inhale 2 puffs into the lungs 2 (two) times daily.  , Disp: , Rfl: ;  busPIRone (BUSPAR) 30 MG tablet, Take 30 mg by mouth  2 (two) times daily.  , Disp: , Rfl: ;  pravastatin (PRAVACHOL) 40 MG tablet, Take 40 mg by mouth daily.  , Disp: , Rfl: ;  tiotropium (SPIRIVA) 18 MCG inhalation capsule, Place 18 mcg into inhaler and inhale daily. , Disp: , Rfl:  zolpidem (AMBIEN) 10 MG tablet, Take 5 mg by mouth at bedtime as needed. Insomnia, Disp: , Rfl:      Objective:   Physical Exam  Vitals reviewed. Constitutional: She is oriented to person, place, and time. She appears well-developed and well-nourished. No distress.  HENT:  Head: Normocephalic and atraumatic.  Right Ear: External ear normal.  Left Ear: External ear normal.  Mouth/Throat: Oropharynx is clear and moist. No oropharyngeal exudate.  Eyes: Conjunctivae and EOM are normal. Pupils are equal, round, and reactive to light. Right eye exhibits no discharge. Left eye exhibits no discharge. No scleral icterus.  Neck: Normal range of motion. Neck supple. No JVD present. No tracheal deviation present. No thyromegaly present.  Cardiovascular: Normal rate, regular rhythm, normal heart sounds and intact distal pulses.  Exam reveals no gallop and no friction rub.   No murmur heard. Pulmonary/Chest: Effort normal. No respiratory distress. She has wheezes. She has no rales. She exhibits no tenderness.  The wheezing is really occasional and scattered  Abdominal: Soft. Bowel sounds are normal. She exhibits no distension and no mass. There is no tenderness. There is no rebound and no guarding.  Musculoskeletal: Normal range of motion. She exhibits no edema and no tenderness.  Lymphadenopathy:    She has no cervical adenopathy.  Neurological: She is alert and oriented to person, place, and time. She has normal reflexes. No cranial nerve deficit. She exhibits normal muscle tone. Coordination normal.  Skin: Skin is warm and dry. No rash noted. She is not diaphoretic. No erythema. No pallor.  Psychiatric: She has a normal mood and affect. Her behavior is normal. Judgment and  thought content normal.        Assessment & Plan:

## 2013-02-25 ENCOUNTER — Ambulatory Visit: Payer: Medicare Other | Admitting: Pulmonary Disease

## 2013-02-25 DIAGNOSIS — M542 Cervicalgia: Secondary | ICD-10-CM | POA: Diagnosis not present

## 2013-03-04 ENCOUNTER — Ambulatory Visit
Admission: RE | Admit: 2013-03-04 | Discharge: 2013-03-04 | Disposition: A | Discharge status: Home / Self Care | Payer: Medicare Other | Source: Ambulatory Visit | Attending: Family Medicine | Admitting: Family Medicine

## 2013-03-04 ENCOUNTER — Other Ambulatory Visit: Payer: Self-pay | Admitting: Family Medicine

## 2013-03-04 DIAGNOSIS — I6522 Occlusion and stenosis of left carotid artery: Secondary | ICD-10-CM

## 2013-03-04 DIAGNOSIS — I658 Occlusion and stenosis of other precerebral arteries: Secondary | ICD-10-CM | POA: Diagnosis not present

## 2013-03-04 IMAGING — US US CAROTID DUPLEX BILAT
1 series · 13 of 24 positions shown · non-contrast
Comparison: None.

CLINICAL DATA: Calcifications of the left carotid artery seen on
recent x-ray, history of hypertension and smoking

BILATERAL CAROTID DUPLEX ULTRASOUND
TECHNIQUE: Gray scale imaging, color Doppler and duplex ultrasound
was performed of bilateral carotid and vertebral arteries in the
neck.

[Series 1: us carotid duplex bilat · 0.08mm/px · 13 of 63 slices shown]
[im 1/63]
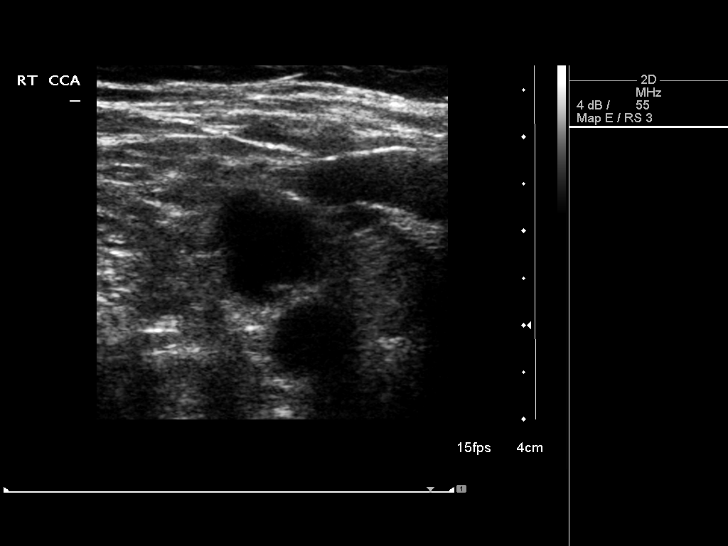
[im 6/63]
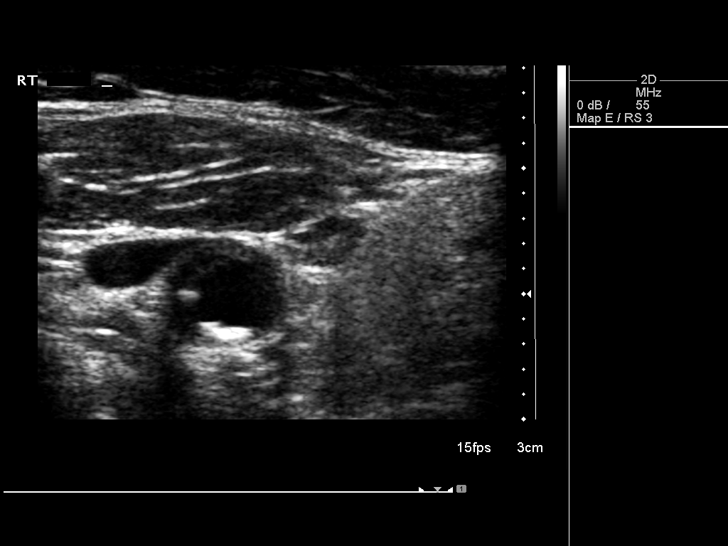
[im 11/63]
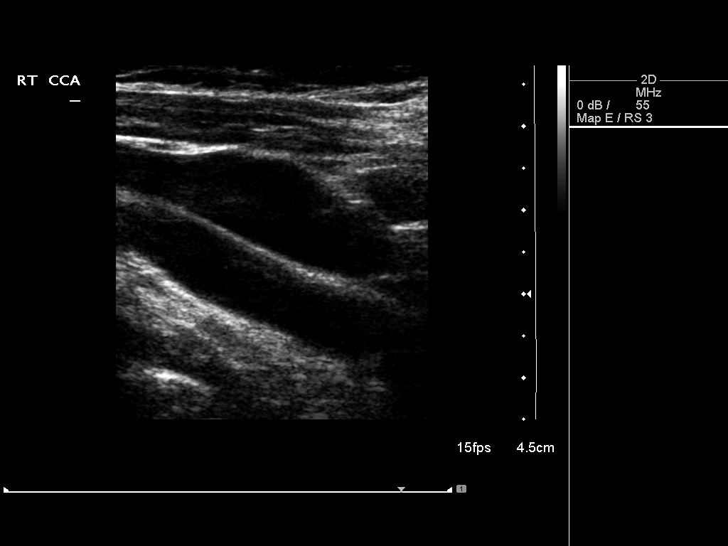
[im 17/63]
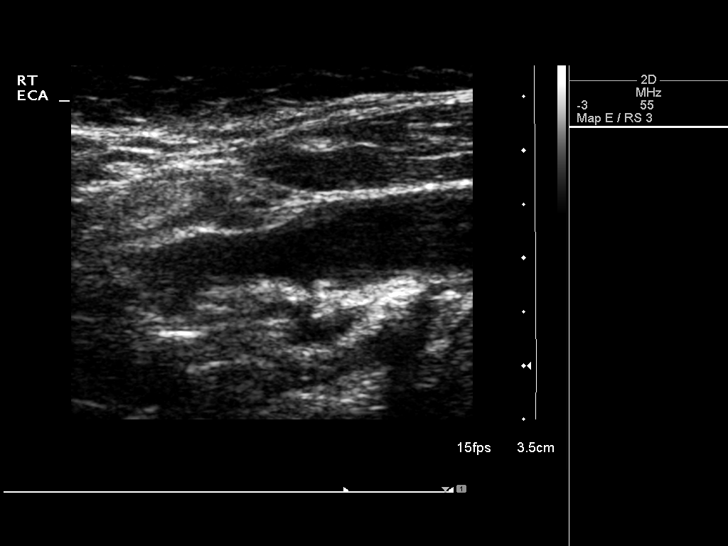
[im 22/63]
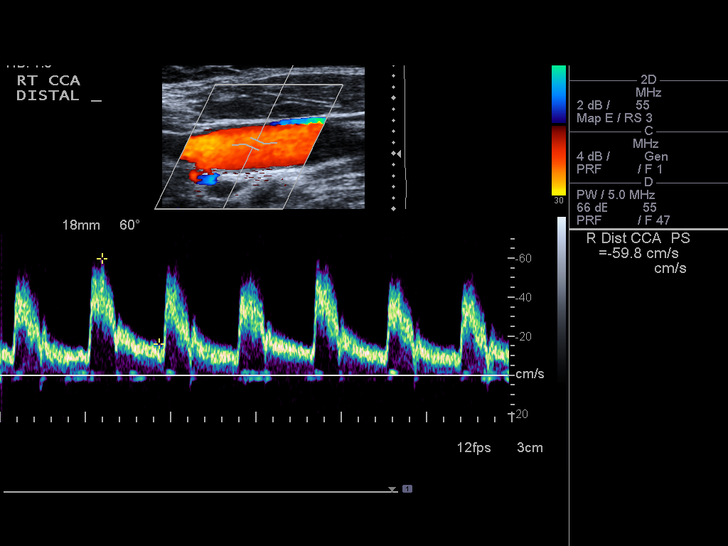
[im 27/63]
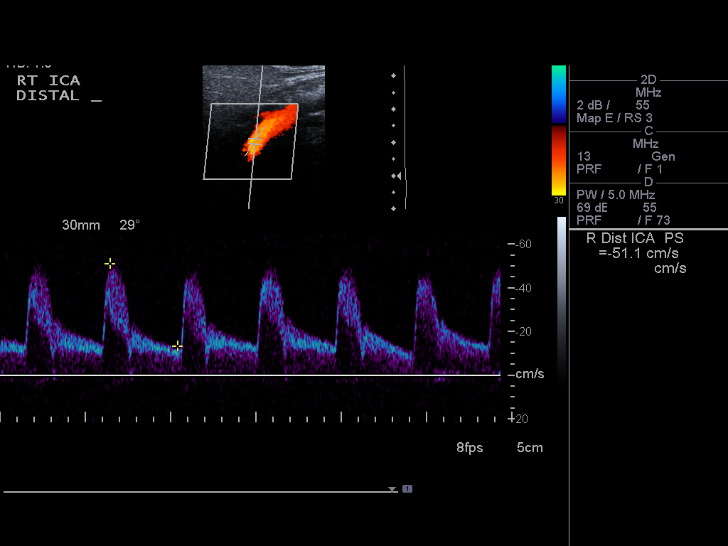
[im 33/63]
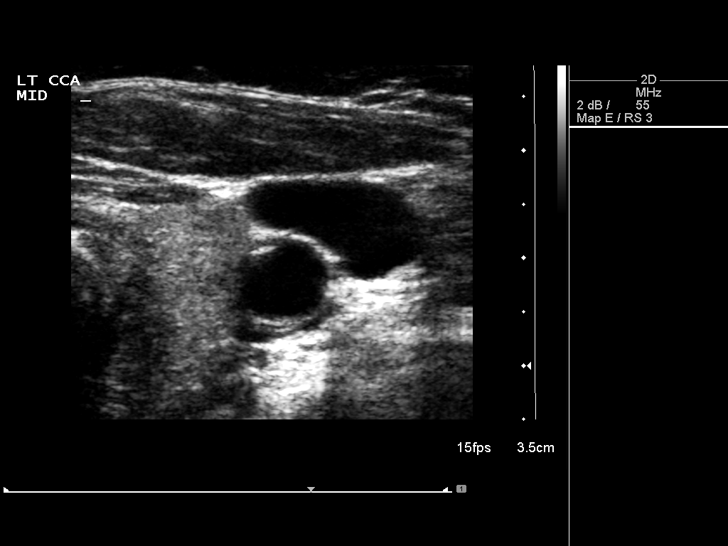
[im 36/63]
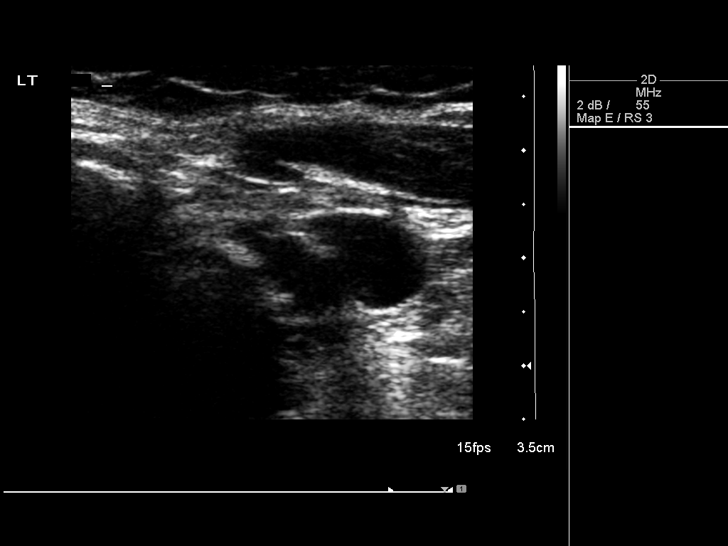
[im 41/63]
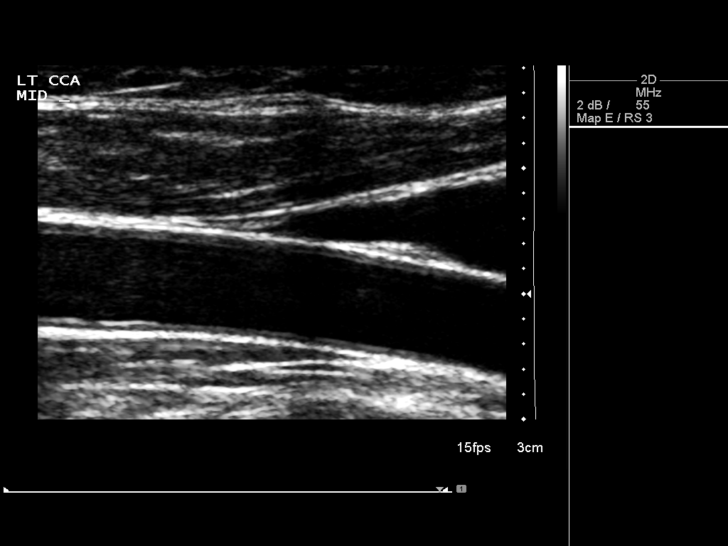
[im 46/63]
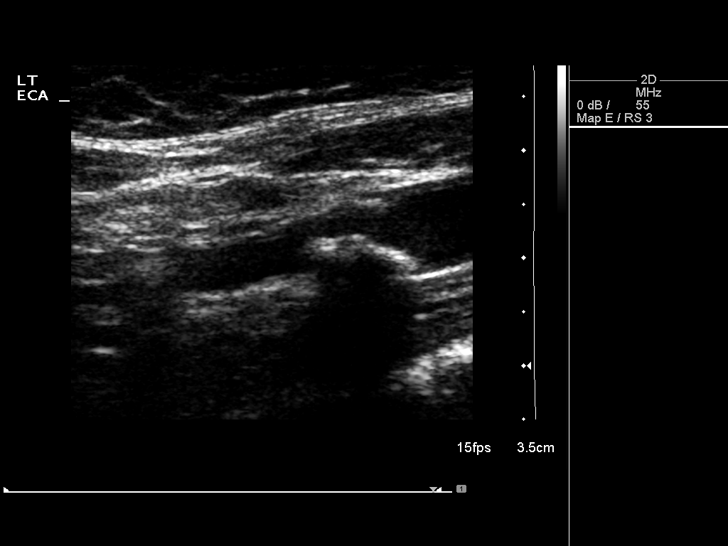
[im 52/63]
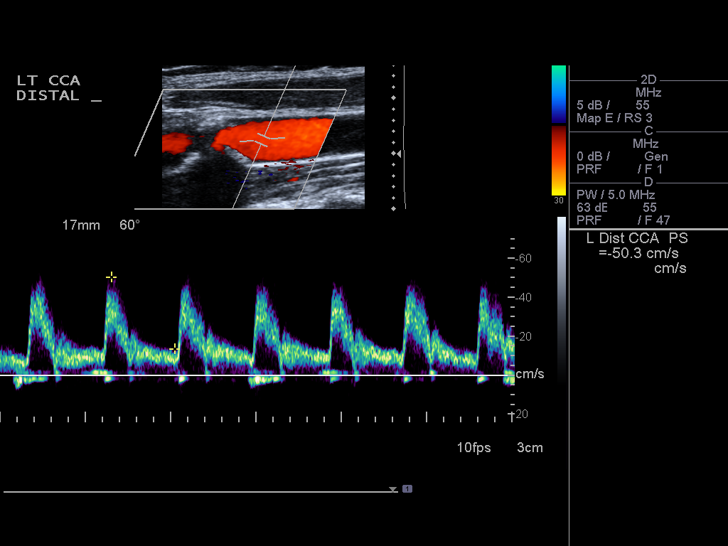
[im 57/63]
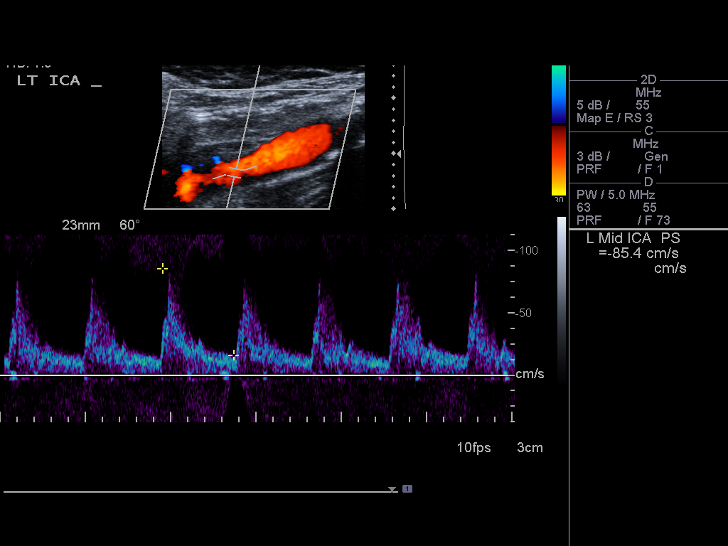
[im 63/63]
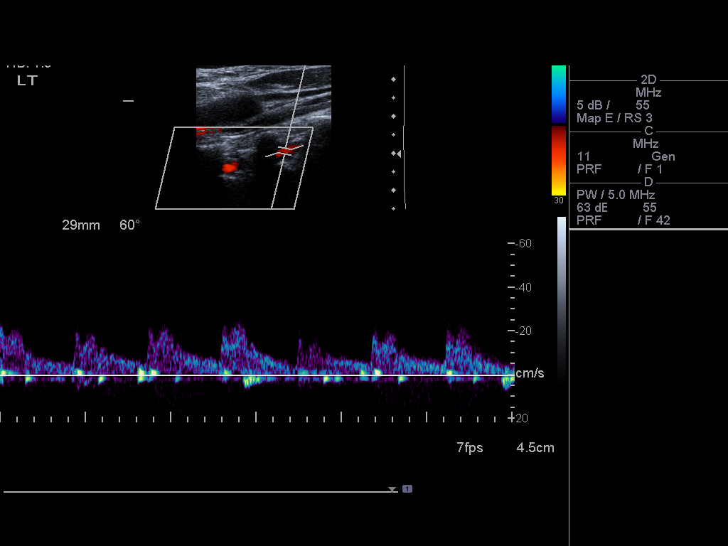

[13 of 24 positions shown; findings below may reference images not displayed]

Criteria:  Quantification of carotid stenosis is based on velocity
parameters that correlate the residual internal carotid diameter
with NASCET-based stenosis levels, using the diameter of the distal
internal carotid lumen as the denominator for stenosis measurement.

The following velocity measurements were obtained:

                 PEAK SYSTOLIC/END DIASTOLIC
RIGHT
ICA:                        82/16cm/sec
CCA:                        60/16cm/sec
SYSTOLIC ICA/CCA RATIO:
DIASTOLIC ICA/CCA RATIO:
ECA:                        188cm/sec

LEFT
ICA:                        80/15cm/sec
CCA:                        66/14cm/sec
SYSTOLIC ICA/CCA RATIO:
DIASTOLIC ICA/CCA RATIO:
ECA:                        208cm/sec
FINDINGS: RIGHT CAROTID ARTERY: There is scattered foci of eccentric
hyperechoic shadowing plaque within the mid aspect of the right
common carotid artery (image 4).  There is eccentric echogenic
partially shadowing plaque within the right carotid bulb (images
seven, eight, 18), not resulting elevated peak systolic velocity in
the right internal carotid artery to suggest a hemodynamically
significant stenosis.

RIGHT VERTEBRAL ARTERY:  Antegrade flow

LEFT CAROTID ARTERY: There is a minimal amount of eccentric mixed
echogenic plaque in the mid aspect of the left common carotid
artery (image 45).  There is mixed echogenic eccentric
atherosclerotic plaque within the left carotid bulb (images 38, 39
and 48), not resulting elevated peak systolic velocity within the
left internal carotid artery to suggest a hemodynamically
significant stenosis.

LEFT VERTEBRAL ARTERY:  Antegrade flow
IMPRESSION: Moderate amount of atherosclerotic plaque bilaterally, not
resulting in a hemodynamically significant stenosis.

## 2013-03-12 ENCOUNTER — Ambulatory Visit: Payer: Medicare Other | Admitting: Pulmonary Disease

## 2013-03-20 DIAGNOSIS — E210 Primary hyperparathyroidism: Secondary | ICD-10-CM | POA: Diagnosis not present

## 2013-03-20 DIAGNOSIS — E559 Vitamin D deficiency, unspecified: Secondary | ICD-10-CM | POA: Diagnosis not present

## 2013-03-20 DIAGNOSIS — M949 Disorder of cartilage, unspecified: Secondary | ICD-10-CM | POA: Diagnosis not present

## 2013-03-20 DIAGNOSIS — M899 Disorder of bone, unspecified: Secondary | ICD-10-CM | POA: Diagnosis not present

## 2013-03-26 ENCOUNTER — Encounter: Payer: Self-pay | Admitting: Pulmonary Disease

## 2013-04-02 ENCOUNTER — Ambulatory Visit: Payer: Medicare Other | Admitting: Pulmonary Disease

## 2013-04-24 ENCOUNTER — Encounter: Payer: Self-pay | Admitting: Pulmonary Disease

## 2013-04-24 ENCOUNTER — Ambulatory Visit (INDEPENDENT_AMBULATORY_CARE_PROVIDER_SITE_OTHER): Payer: Medicare Other | Admitting: Pulmonary Disease

## 2013-04-24 VITALS — BP 132/82 | HR 63 | Temp 98.0°F | Ht 59.5 in | Wt 152.5 lb

## 2013-04-24 DIAGNOSIS — J449 Chronic obstructive pulmonary disease, unspecified: Secondary | ICD-10-CM

## 2013-04-24 NOTE — Assessment & Plan Note (Signed)
The patient is back to her usual baseline after an acute exacerbation in April.  She has decreased her smoking significantly, and is trying to totally quit.  I have counseled her again on smoking cessation today.  She is only good bronchodilator regimen, and I have encouraged her to stay on this.  I've also asked her to work on weight loss with some type of exercise program.

## 2013-04-24 NOTE — Progress Notes (Signed)
  Subjective:    Patient ID: Holly [name], female    DOB: [birth year], 67 y.o.   MRN: [id number]  HPI Patient comes in today for followup of her known significant COPD.  She was seen as an acute sick visit in April by one of my partners for an acute exacerbation.  She responded well to a course of prednisone and antibiotics, as well as changing some of her antihypertensive medications.  She feels that she is at a stable baseline currently, and does not have any chest congestion or cough at this time.  She is down to 6 cigarettes a day, and is trying to quit.   Review of Systems  Constitutional: Negative for fever and unexpected weight change.  HENT: Negative for ear pain, nosebleeds, congestion, sore throat, rhinorrhea, sneezing, trouble swallowing, dental problem, postnasal drip and sinus pressure.   Eyes: Negative for redness and itching.  Respiratory: Negative for cough, chest tightness, shortness of breath and wheezing.   Cardiovascular: Negative for palpitations and leg swelling.  Gastrointestinal: Negative for nausea and vomiting.  Genitourinary: Negative for dysuria.  Musculoskeletal: Negative for joint swelling.  Skin: Negative for rash.  Neurological: Negative for headaches.  Hematological: Does not bruise/bleed easily.  Psychiatric/Behavioral: Negative for dysphoric mood. The patient is not nervous/anxious.        Objective:   Physical Exam Obese female in no acute distress Nose without purulent discharge noted Neck without lymphadenopathy or thyromegaly Chest with clear breath sounds, no wheezes or rhonchi Cardiac exam with regular rate and rhythm Lower extremities without edema, no cyanosis Alert and oriented, moves all 4 extremities.       Assessment & Plan:

## 2013-04-24 NOTE — Patient Instructions (Addendum)
No change in medications. Keep working on total smoking cessation.  You are making progress. Work on exercise program. followup with me in 6mos .

## 2013-07-01 ENCOUNTER — Telehealth: Payer: Self-pay | Admitting: Pulmonary Disease

## 2013-07-01 NOTE — Telephone Encounter (Signed)
Samples are ready for pick up. Pt is aware. Nothing further is needed.

## 2013-07-19 DIAGNOSIS — Z23 Encounter for immunization: Secondary | ICD-10-CM | POA: Diagnosis not present

## 2013-07-19 DIAGNOSIS — E559 Vitamin D deficiency, unspecified: Secondary | ICD-10-CM | POA: Diagnosis not present

## 2013-07-19 DIAGNOSIS — F411 Generalized anxiety disorder: Secondary | ICD-10-CM | POA: Diagnosis not present

## 2013-07-19 DIAGNOSIS — E785 Hyperlipidemia, unspecified: Secondary | ICD-10-CM | POA: Diagnosis not present

## 2013-07-19 DIAGNOSIS — F17200 Nicotine dependence, unspecified, uncomplicated: Secondary | ICD-10-CM | POA: Diagnosis not present

## 2013-07-19 DIAGNOSIS — I10 Essential (primary) hypertension: Secondary | ICD-10-CM | POA: Diagnosis not present

## 2013-07-19 DIAGNOSIS — J449 Chronic obstructive pulmonary disease, unspecified: Secondary | ICD-10-CM | POA: Diagnosis not present

## 2013-10-25 ENCOUNTER — Ambulatory Visit: Payer: Medicare Other | Admitting: Pulmonary Disease

## 2013-10-26 DIAGNOSIS — S139XXA Sprain of joints and ligaments of unspecified parts of neck, initial encounter: Secondary | ICD-10-CM | POA: Diagnosis not present

## 2013-10-26 DIAGNOSIS — G5690 Unspecified mononeuropathy of unspecified upper limb: Secondary | ICD-10-CM | POA: Diagnosis not present

## 2013-11-18 DIAGNOSIS — M5412 Radiculopathy, cervical region: Secondary | ICD-10-CM | POA: Diagnosis not present

## 2013-11-20 ENCOUNTER — Ambulatory Visit: Payer: Medicare Other | Admitting: Pulmonary Disease

## 2013-11-28 DIAGNOSIS — J209 Acute bronchitis, unspecified: Secondary | ICD-10-CM | POA: Diagnosis not present

## 2013-12-05 ENCOUNTER — Other Ambulatory Visit: Payer: Self-pay | Admitting: Family Medicine

## 2013-12-05 ENCOUNTER — Ambulatory Visit
Admission: RE | Admit: 2013-12-05 | Discharge: 2013-12-05 | Disposition: A | Discharge status: Home / Self Care | Payer: Medicare Other | Source: Ambulatory Visit | Attending: Family Medicine | Admitting: Family Medicine

## 2013-12-05 DIAGNOSIS — R1013 Epigastric pain: Secondary | ICD-10-CM

## 2013-12-05 DIAGNOSIS — M5090 Cervical disc disorder, unspecified, unspecified cervical region: Secondary | ICD-10-CM | POA: Diagnosis not present

## 2013-12-05 DIAGNOSIS — R05 Cough: Secondary | ICD-10-CM | POA: Diagnosis not present

## 2013-12-05 DIAGNOSIS — J40 Bronchitis, not specified as acute or chronic: Secondary | ICD-10-CM | POA: Diagnosis not present

## 2013-12-05 DIAGNOSIS — K5900 Constipation, unspecified: Secondary | ICD-10-CM | POA: Diagnosis not present

## 2013-12-05 DIAGNOSIS — R059 Cough, unspecified: Secondary | ICD-10-CM | POA: Diagnosis not present

## 2013-12-05 IMAGING — CR DG ABDOMEN ACUTE W/ 1V CHEST
3 series · 3 of 3 positions shown · non-contrast
Comparison: None.

CLINICAL DATA: Abdominal pain epigastric region for 10 days. Cough
and congestion.

EXAM:
ACUTE ABDOMEN SERIES (ABDOMEN 2 VIEW & CHEST 1 VIEW)

[view not recorded (1 of 3)]
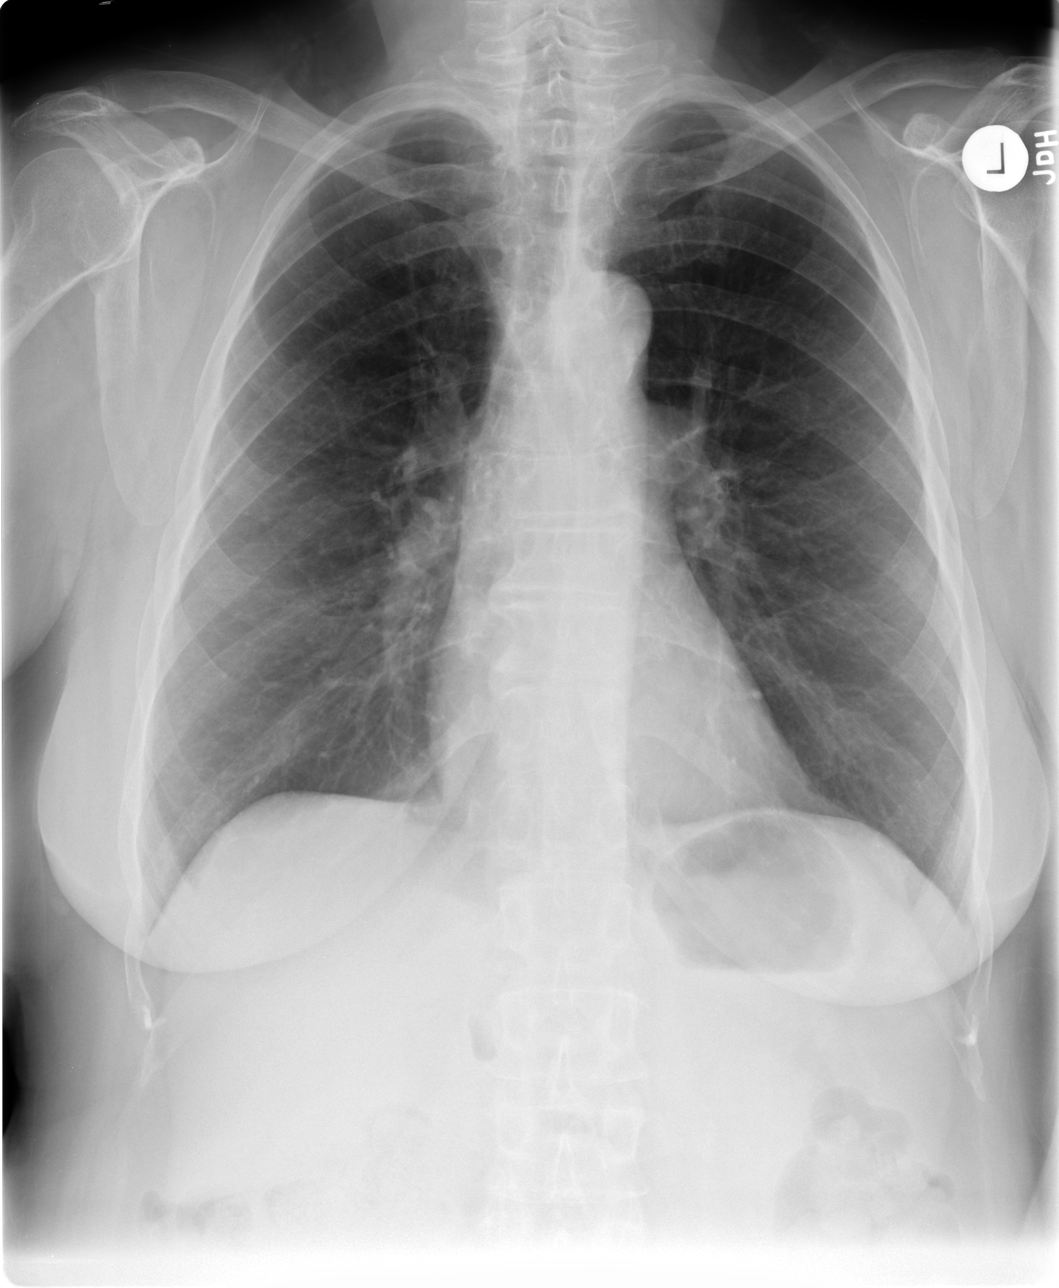

[view not recorded (2 of 3)]
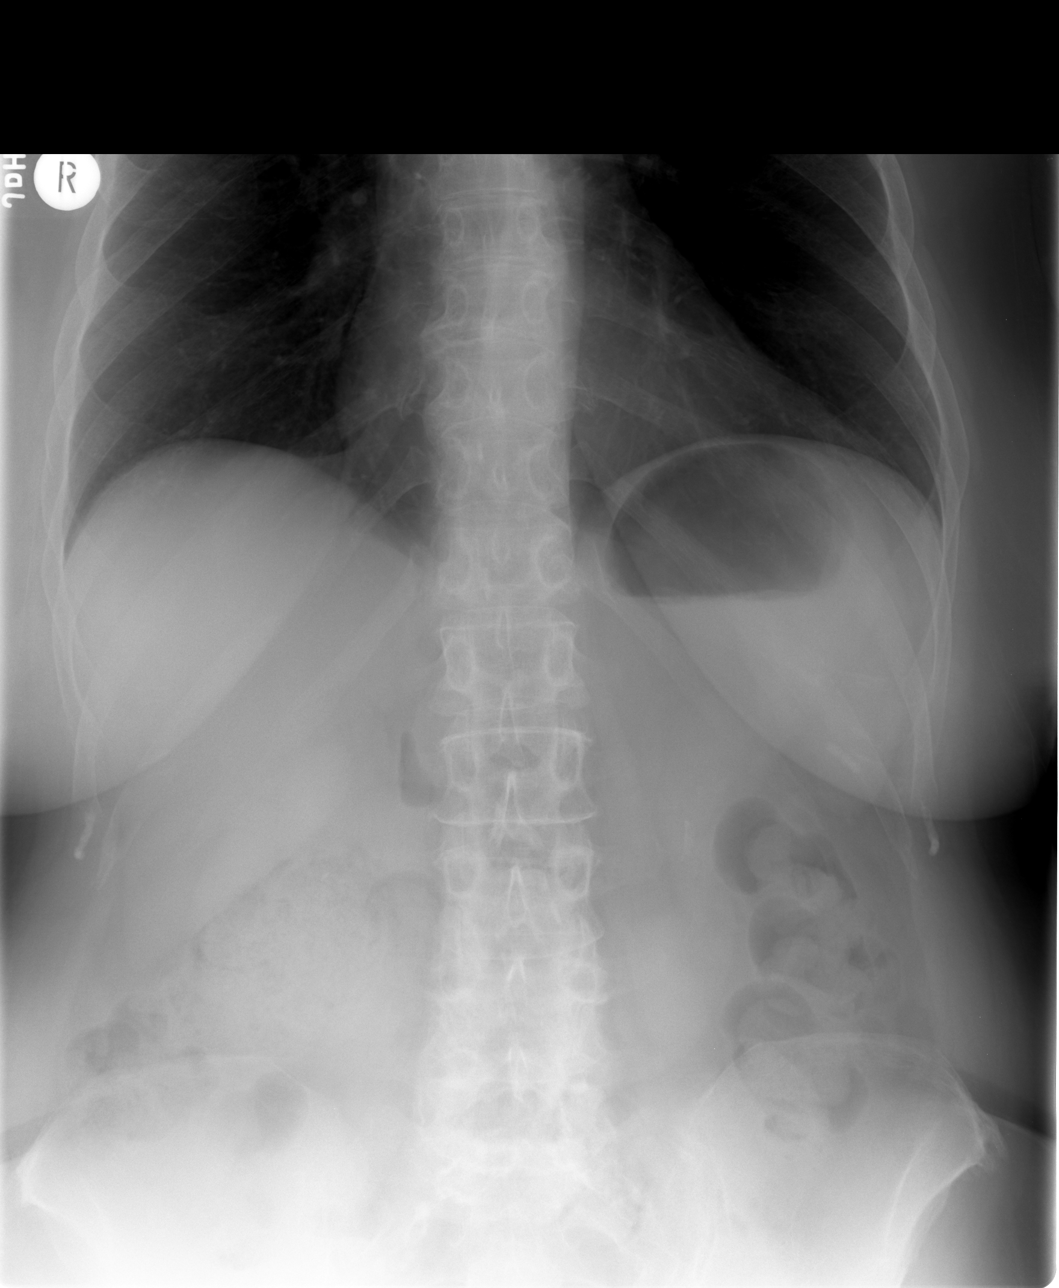

[view not recorded (3 of 3)]
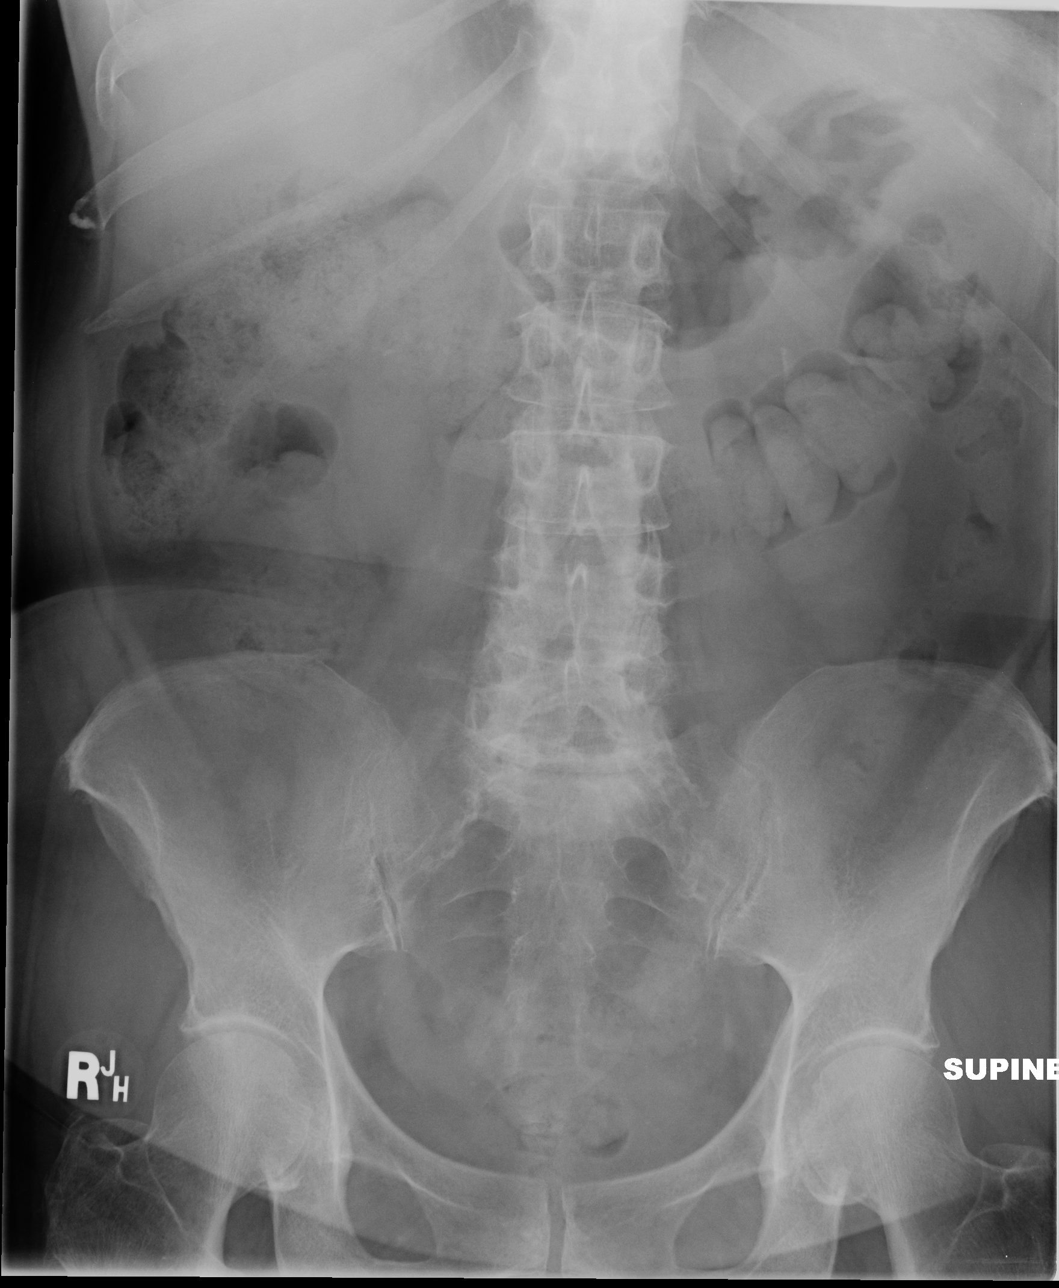

[3 of 3 positions shown; findings below may reference images not displayed]

FINDINGS: Lungs are clear. Cardiomediastinal silhouette is within normal.
There is very minimal calcified plaque involving the thoracic aorta.
There is mild spondylosis of the spine.

Abdominal images demonstrate mild fecal retention throughout the
colon without evidence of obstruction. There is no free air. There
is a 1 cm linear calcific density over the left mid to upper abdomen
likely vascular although cannot exclude a renal stone. There are
mild degenerative changes of the spine and hips.
IMPRESSION: Mild fecal retention throughout the colon without evidence of
obstruction.

No acute cardiopulmonary disease.

1 cm linear calcification over the left abdomen likely vascular
although cannot exclude a renal stone

## 2014-01-24 ENCOUNTER — Other Ambulatory Visit: Payer: Self-pay | Admitting: Family Medicine

## 2014-01-24 DIAGNOSIS — G479 Sleep disorder, unspecified: Secondary | ICD-10-CM | POA: Diagnosis not present

## 2014-01-24 DIAGNOSIS — I10 Essential (primary) hypertension: Secondary | ICD-10-CM | POA: Diagnosis not present

## 2014-01-24 DIAGNOSIS — M899 Disorder of bone, unspecified: Secondary | ICD-10-CM | POA: Diagnosis not present

## 2014-01-24 DIAGNOSIS — E559 Vitamin D deficiency, unspecified: Secondary | ICD-10-CM | POA: Diagnosis not present

## 2014-01-24 DIAGNOSIS — M949 Disorder of cartilage, unspecified: Secondary | ICD-10-CM | POA: Diagnosis not present

## 2014-01-24 DIAGNOSIS — Z0000 Encounter for general adult medical examination without abnormal findings: Secondary | ICD-10-CM | POA: Diagnosis not present

## 2014-01-24 DIAGNOSIS — F411 Generalized anxiety disorder: Secondary | ICD-10-CM | POA: Diagnosis not present

## 2014-01-24 DIAGNOSIS — R7309 Other abnormal glucose: Secondary | ICD-10-CM | POA: Diagnosis not present

## 2014-01-24 DIAGNOSIS — Z23 Encounter for immunization: Secondary | ICD-10-CM | POA: Diagnosis not present

## 2014-01-24 DIAGNOSIS — F17200 Nicotine dependence, unspecified, uncomplicated: Secondary | ICD-10-CM | POA: Diagnosis not present

## 2014-01-24 DIAGNOSIS — E785 Hyperlipidemia, unspecified: Secondary | ICD-10-CM | POA: Diagnosis not present

## 2014-01-27 ENCOUNTER — Other Ambulatory Visit: Payer: Self-pay | Admitting: Family Medicine

## 2014-01-27 DIAGNOSIS — IMO0001 Reserved for inherently not codable concepts without codable children: Secondary | ICD-10-CM

## 2014-02-06 DIAGNOSIS — L821 Other seborrheic keratosis: Secondary | ICD-10-CM | POA: Diagnosis not present

## 2014-02-06 DIAGNOSIS — Z85828 Personal history of other malignant neoplasm of skin: Secondary | ICD-10-CM | POA: Diagnosis not present

## 2014-02-06 DIAGNOSIS — L819 Disorder of pigmentation, unspecified: Secondary | ICD-10-CM | POA: Diagnosis not present

## 2014-02-06 DIAGNOSIS — L739 Follicular disorder, unspecified: Secondary | ICD-10-CM | POA: Diagnosis not present

## 2014-02-06 DIAGNOSIS — L570 Actinic keratosis: Secondary | ICD-10-CM | POA: Diagnosis not present

## 2014-03-05 ENCOUNTER — Other Ambulatory Visit: Payer: Medicare Other

## 2014-05-13 DIAGNOSIS — H5231 Anisometropia: Secondary | ICD-10-CM | POA: Diagnosis not present

## 2014-05-13 DIAGNOSIS — H524 Presbyopia: Secondary | ICD-10-CM | POA: Diagnosis not present

## 2014-05-13 DIAGNOSIS — H40009 Preglaucoma, unspecified, unspecified eye: Secondary | ICD-10-CM | POA: Diagnosis not present

## 2014-05-13 DIAGNOSIS — H52229 Regular astigmatism, unspecified eye: Secondary | ICD-10-CM | POA: Diagnosis not present

## 2014-06-11 ENCOUNTER — Ambulatory Visit
Admission: RE | Admit: 2014-06-11 | Discharge: 2014-06-11 | Disposition: A | Discharge status: Home / Self Care | Payer: Medicare Other | Source: Ambulatory Visit | Attending: Family Medicine | Admitting: Family Medicine

## 2014-06-11 DIAGNOSIS — IMO0001 Reserved for inherently not codable concepts without codable children: Secondary | ICD-10-CM

## 2014-06-11 DIAGNOSIS — I6529 Occlusion and stenosis of unspecified carotid artery: Secondary | ICD-10-CM | POA: Diagnosis not present

## 2014-06-11 DIAGNOSIS — Z1231 Encounter for screening mammogram for malignant neoplasm of breast: Secondary | ICD-10-CM | POA: Diagnosis not present

## 2014-06-11 IMAGING — US US CAROTID DUPLEX BILAT
1 series · 13 of 24 positions shown · non-contrast
Comparison: None.

CLINICAL DATA: Followup carotid plaque

EXAM:
BILATERAL CAROTID DUPLEX ULTRASOUND
TECHNIQUE: Gray scale imaging, color Doppler and duplex ultrasound were
performed of bilateral carotid and vertebral arteries in the neck.

[Series 1: us carotid duplex bilat · 0.11mm/px · 13 of 66 slices shown]
[im 1/66]
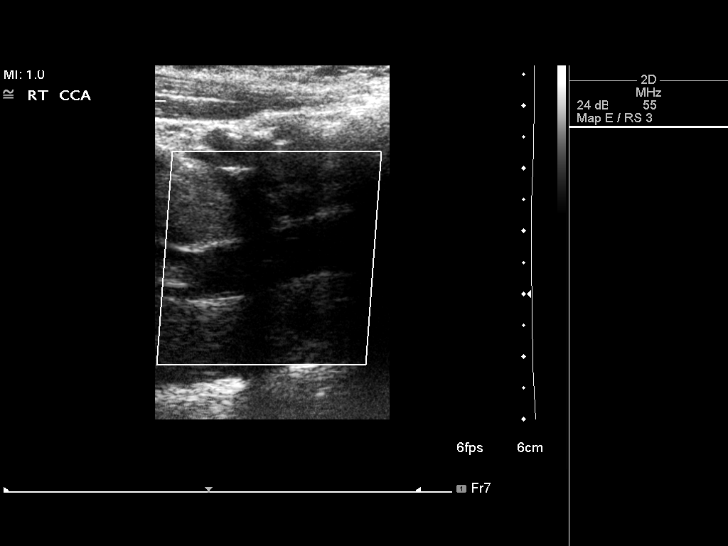
[im 6/66]
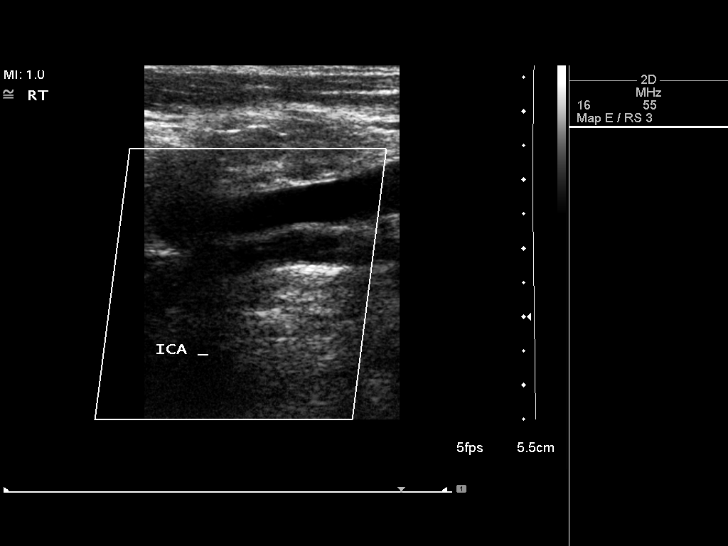
[im 12/66]
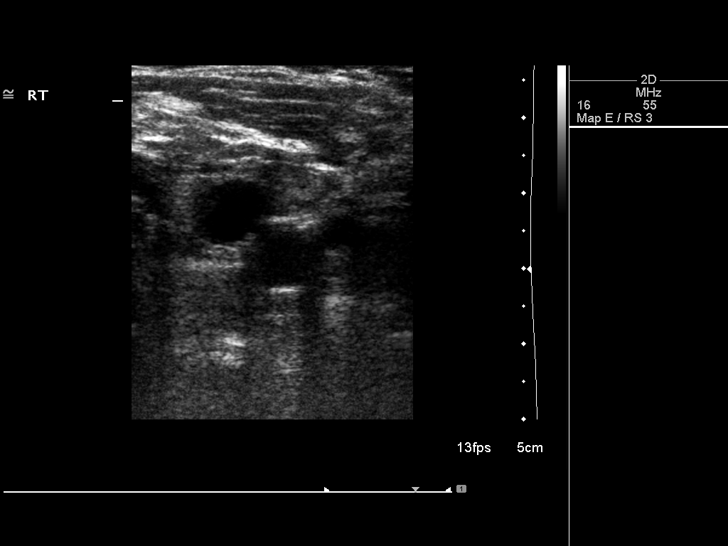
[im 17/66]
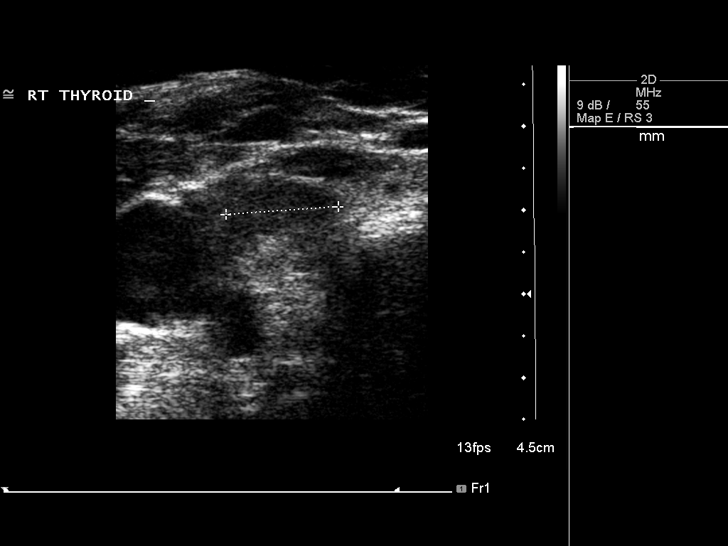
[im 23/66]
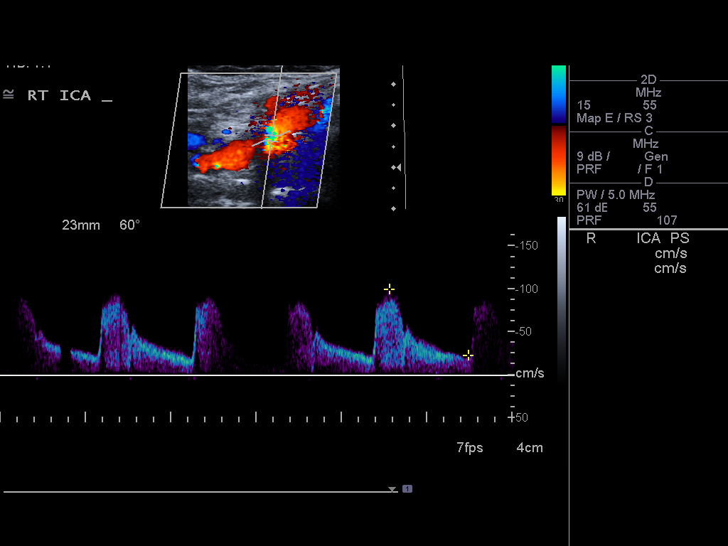
[im 29/66]
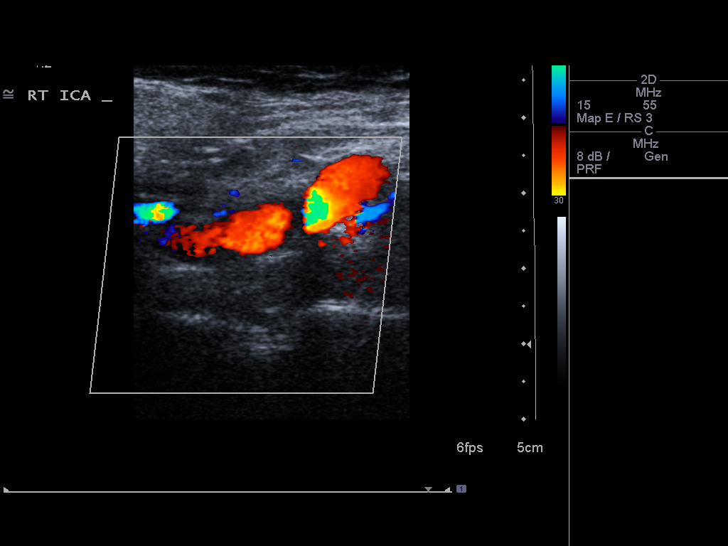
[im 34/66]
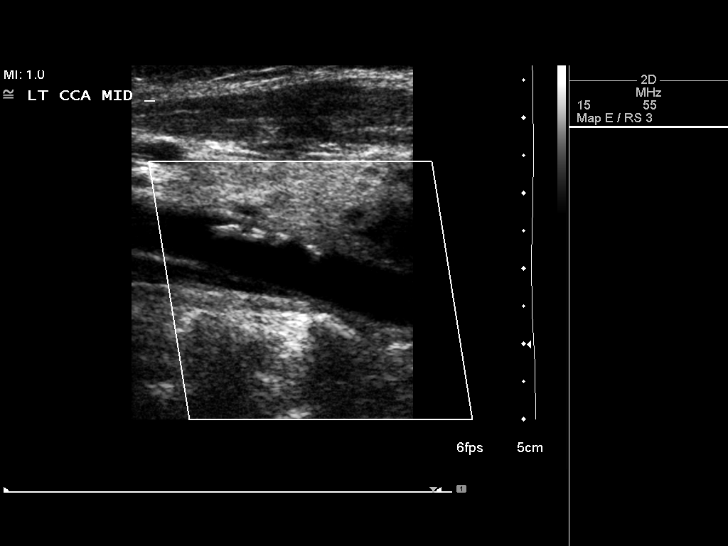
[im 37/66]
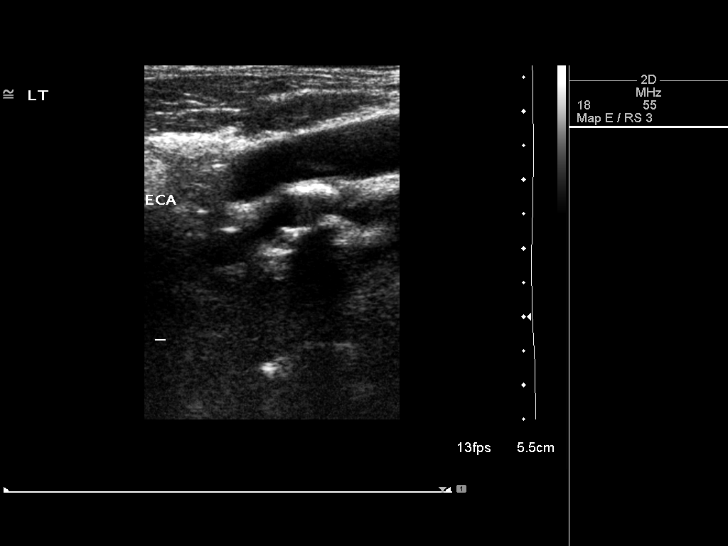
[im 43/66]
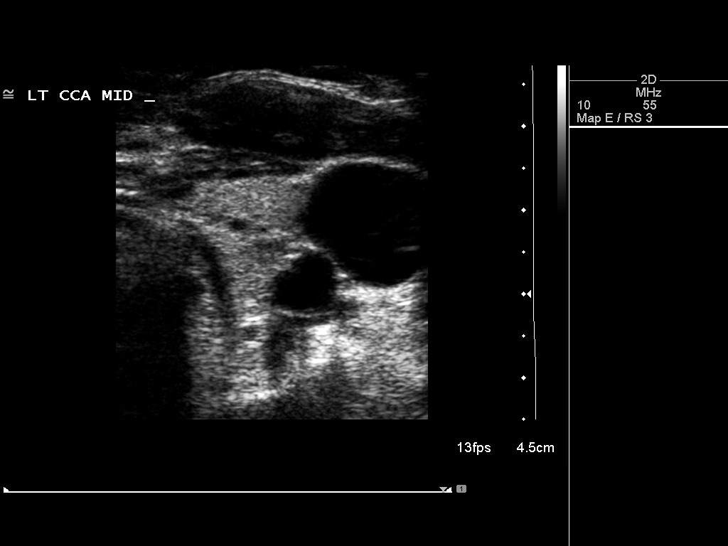
[im 49/66]
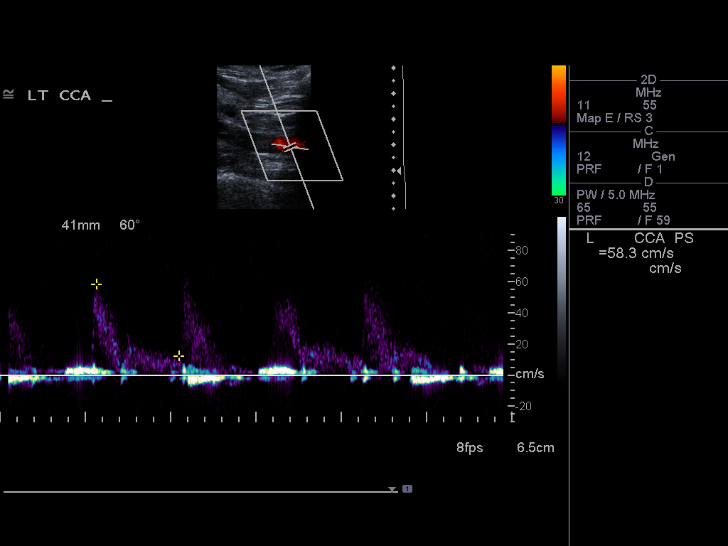
[im 54/66]
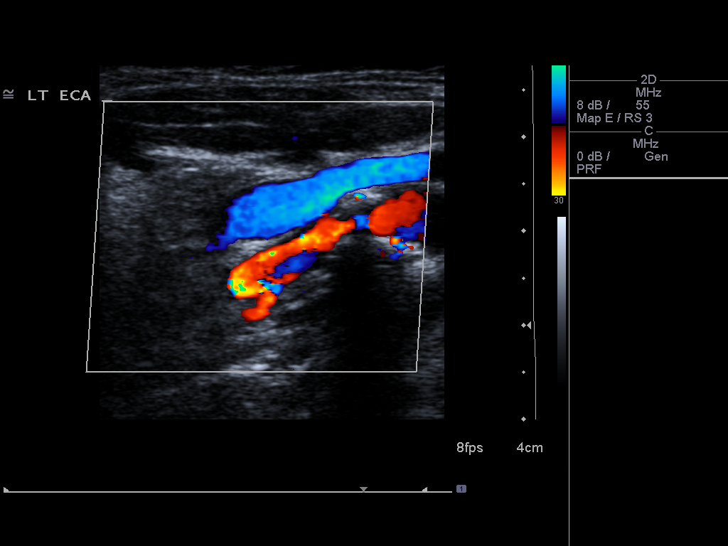
[im 60/66]
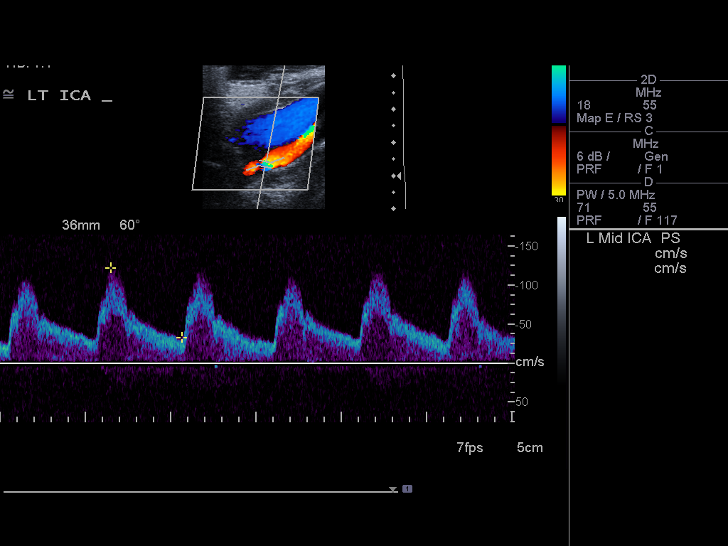
[im 66/66]
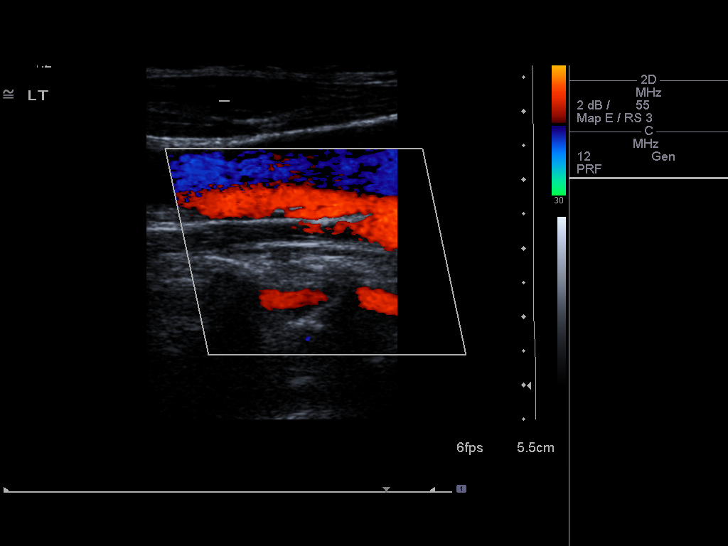

[13 of 24 positions shown; findings below may reference images not displayed]

FINDINGS: Criteria: Quantification of carotid stenosis is based on velocity
parameters that correlate the residual internal carotid diameter
with NASCET-based stenosis levels, using the diameter of the distal
internal carotid lumen as the denominator for stenosis measurement.

The following velocity measurements were obtained:

RIGHT

ICA:  100/23 cm/sec

CCA:  70/18 cm/sec

SYSTOLIC ICA/CCA RATIO:

DIASTOLIC ICA/CCA RATIO:

ECA:  114 cm/sec

LEFT

ICA:  193/33 cm/sec

CCA:  71/17 cm/sec

SYSTOLIC ICA/CCA RATIO:

DIASTOLIC ICA/CCA RATIO:

ECA:  189 cm/sec

RIGHT CAROTID ARTERY: Calcified and noncalcified plaque is noted
within the common carotid artery and carotid bulb extending into the
proximal internal carotid artery. No focal hemodynamically
significant stenosis is noted.

RIGHT VERTEBRAL ARTERY:  Antegrade in nature.

LEFT CAROTID ARTERY: Diffuse calcific plaque is noted similar to
that seen on the right side. Elevated velocities in the proximal
internal carotid artery indicating stenosis in the 50-69% range.
This is a significant increase from the prior exam.

LEFT VERTEBRAL ARTERY:  Antegrade in nature.

1.3 cm right thyroid nodule is noted. This does not meet biopsy
criteria although a dedicated thyroid ultrasound may be indicated.
IMPRESSION: Increase in the degree of stenosis on the left now in the 50-69%
range. No focal stenosis is noted on the right.

Right thyroid nodule as described.

These results will be called to the ordering clinician or
representative by the Radiologist Assistant, and communication
documented in the PACS or zVision Dashboard.

## 2014-06-13 ENCOUNTER — Other Ambulatory Visit: Payer: Self-pay | Admitting: Family Medicine

## 2014-06-13 DIAGNOSIS — E041 Nontoxic single thyroid nodule: Secondary | ICD-10-CM

## 2014-06-17 ENCOUNTER — Ambulatory Visit
Admission: RE | Admit: 2014-06-17 | Discharge: 2014-06-17 | Disposition: A | Discharge status: Home / Self Care | Payer: Medicare Other | Source: Ambulatory Visit | Attending: Family Medicine | Admitting: Family Medicine

## 2014-06-17 DIAGNOSIS — E042 Nontoxic multinodular goiter: Secondary | ICD-10-CM | POA: Diagnosis not present

## 2014-06-17 DIAGNOSIS — E041 Nontoxic single thyroid nodule: Secondary | ICD-10-CM

## 2014-06-17 IMAGING — US US SOFT TISSUE HEAD/NECK
1 series · 13 of 25 positions shown · non-contrast
Comparison: Carotid duplex ultrasound [DATE]

CLINICAL DATA: Thyroid nodules seen on carotid duplex ultrasound

EXAM:
THYROID ULTRASOUND
TECHNIQUE: Ultrasound examination of the thyroid gland and adjacent soft
tissues was performed.

[Series 1: us soft tissue head/neck · 0.05mm/px · 13 of 59 slices shown]
[im 1/59]
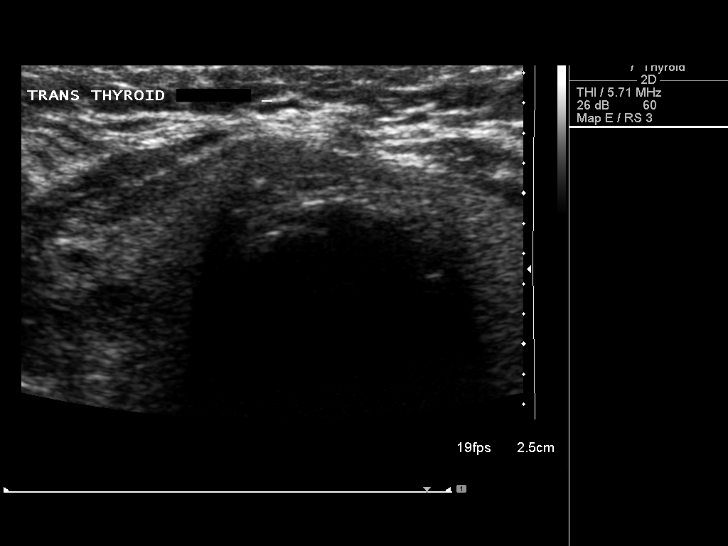
[im 5/59]
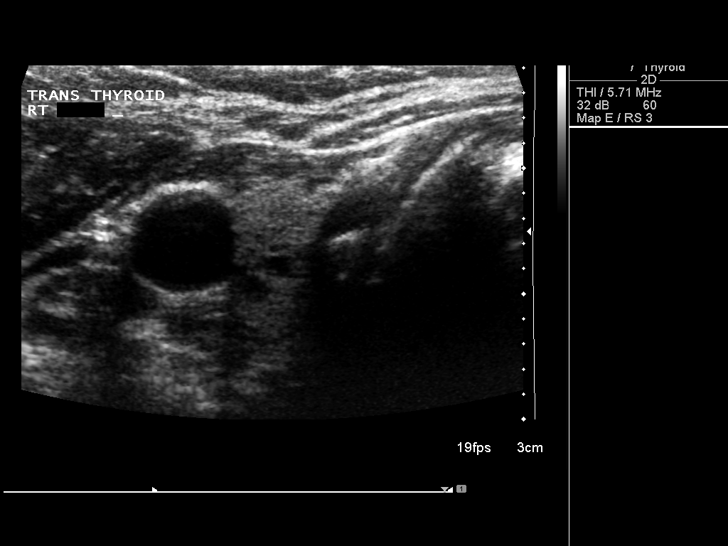
[im 10/59]
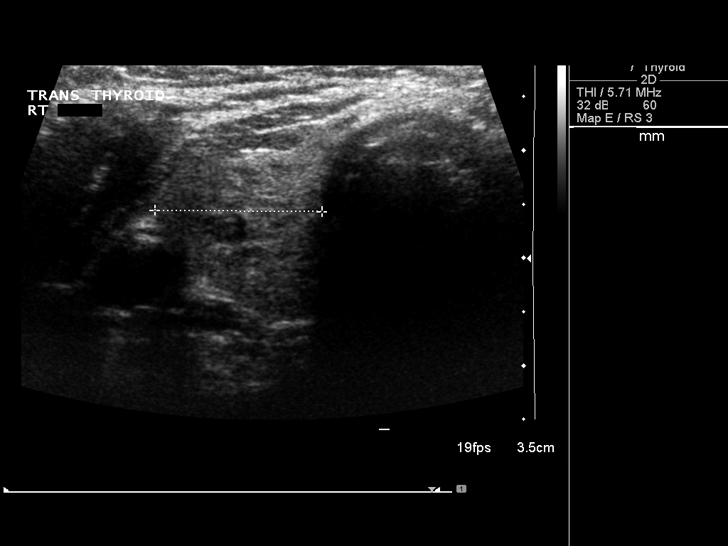
[im 15/59]
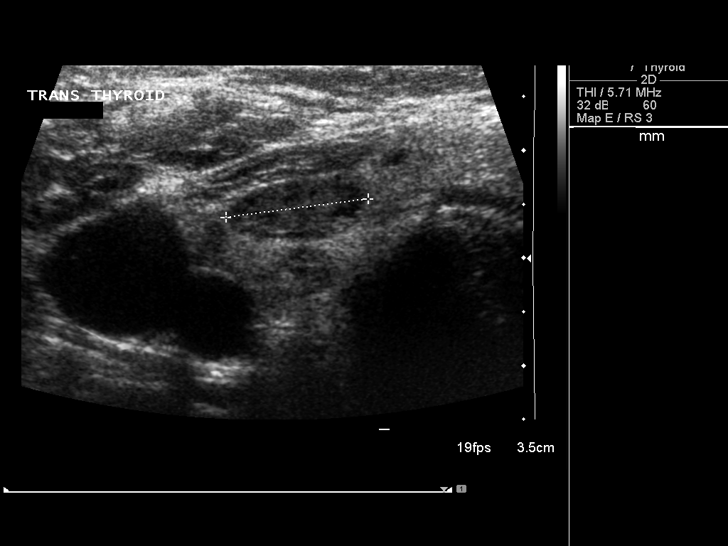
[im 20/59]
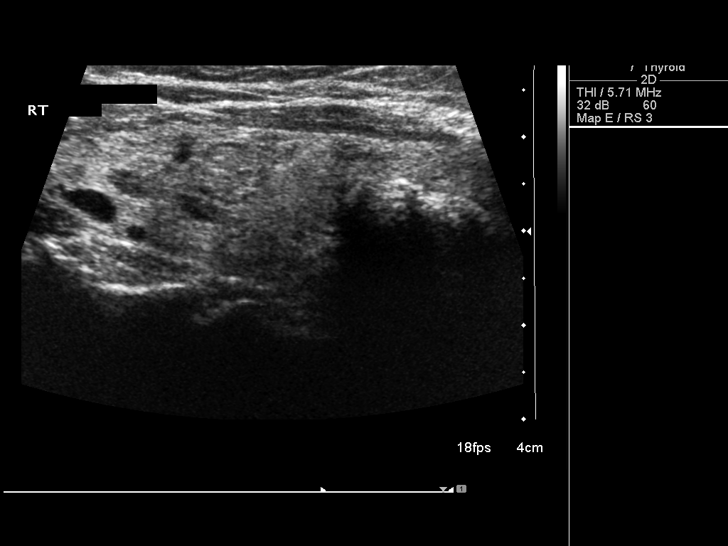
[im 25/59]
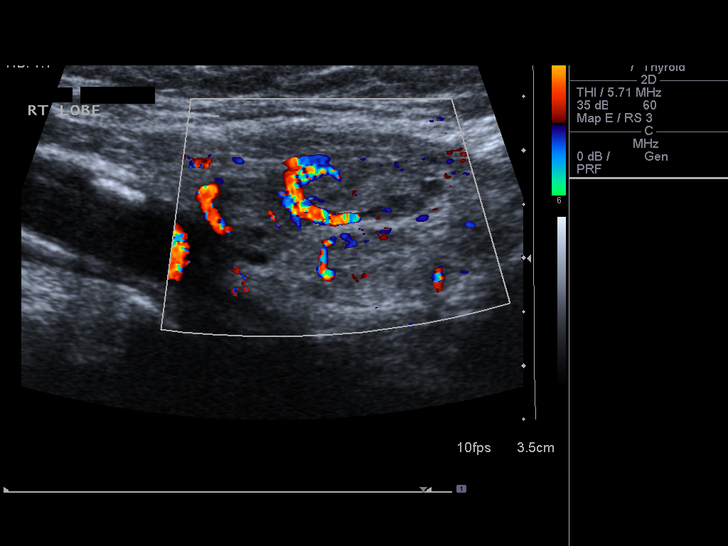
[im 30/59]
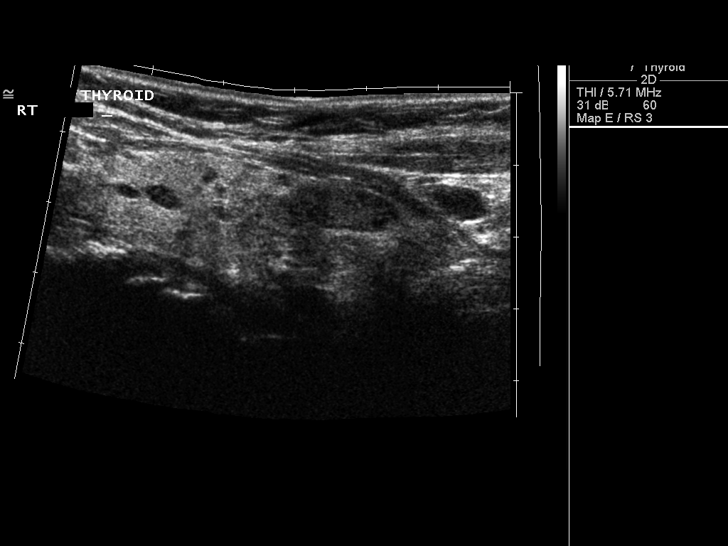
[im 34/59]
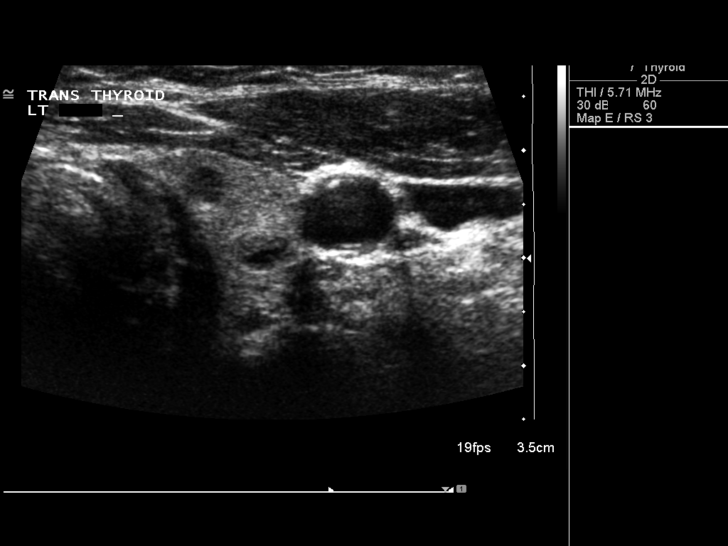
[im 39/59]
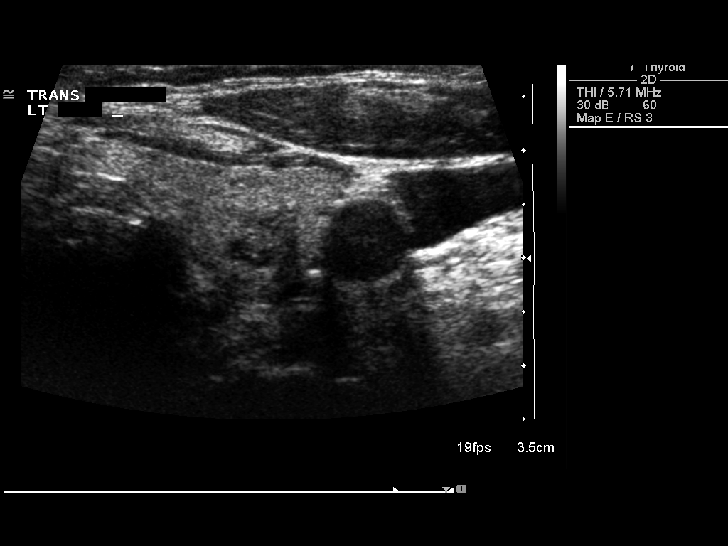
[im 44/59]
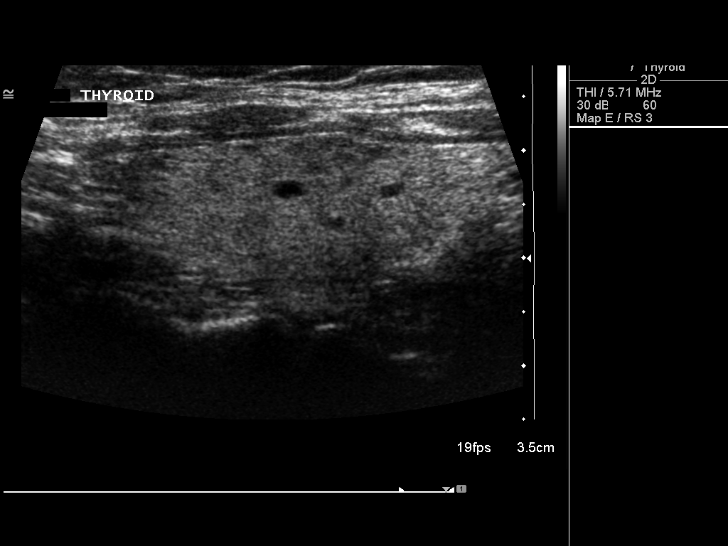
[im 49/59]
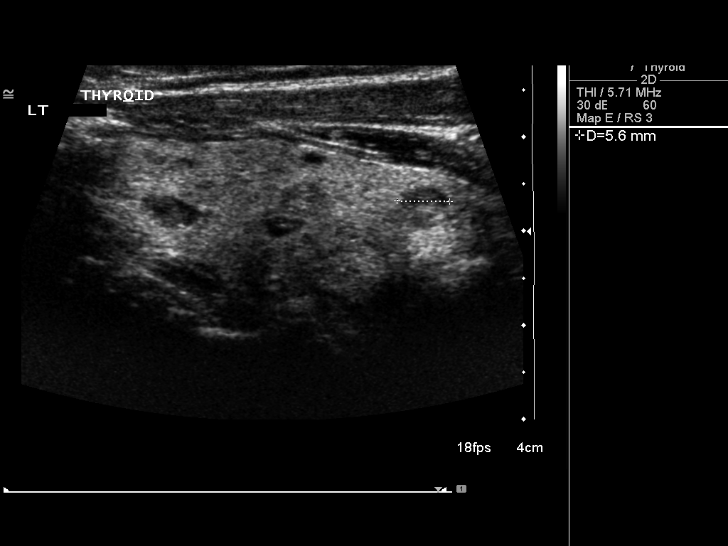
[im 54/59]
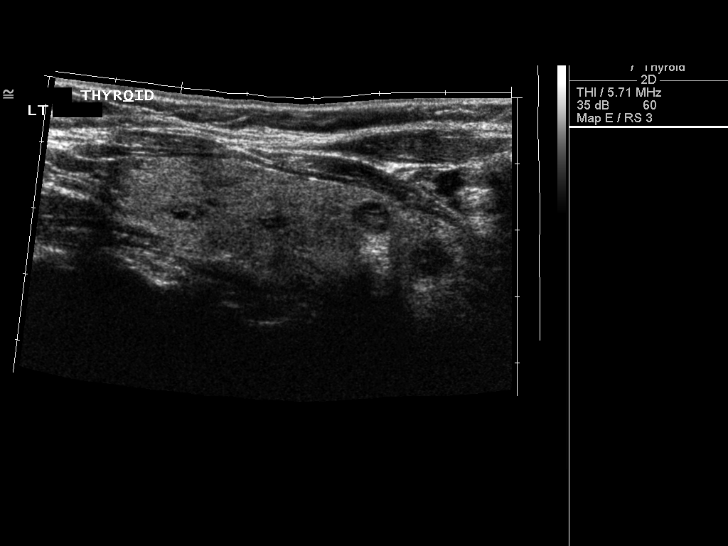
[im 59/59]
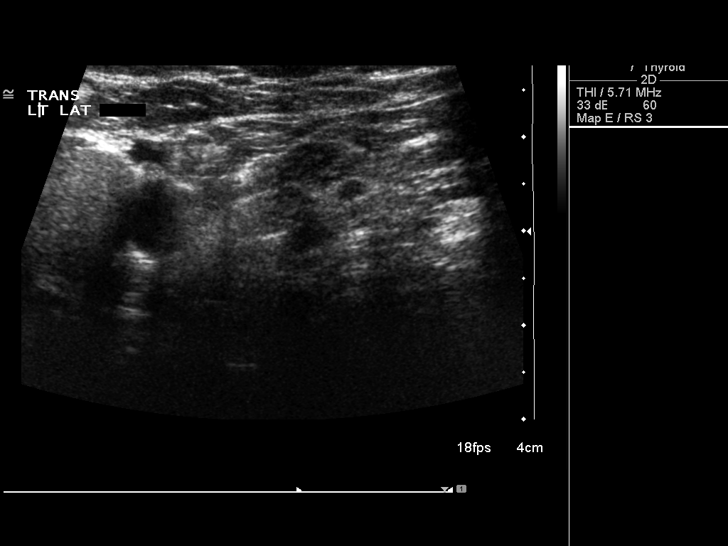

[13 of 25 positions shown; findings below may reference images not displayed]

FINDINGS: Right thyroid lobe

Measurements: 5.6 x 1.7 x 1.6 cm. Enlarged and diffusely
heterogeneous thyroid gland. Numerous circumscribed hypoechoic
nodule scattered throughout the gland measuring less than 1 cm.
There is a dominant 1.3 x 1.0 x 0.6 cm hypoechoic nodule in the mid
to lower aspect of the gland. Adjacent to this ovoid nodule is a
separate but similar appearing 1.5 x 0.6 x 1.3 cm hypoechoic ovoid
nodule.

Left thyroid lobe

Measurements: 5.6 x 1.7 x 1.6 cm. Multiple small hypoechoic nodules
scattered throughout the gland measuring less than 1 cm.

Isthmus

Thickness: 0.6 cm.  No nodules visualized.

Lymphadenopathy

None visualized.
IMPRESSION: 1. The dominant 1.5 cm ovoid hypoechoic predominantly solid nodule
in the mid to lower right thyroid gland meet consensus criteria for
biopsy by size. Ultrasound-guided fine needle aspiration should be
considered, as per the consensus statement: Management of Thyroid
Nodules Detected at US: Society of Radiologists in Ultrasound
2. Additional numerous hypoechoic nodules bilaterally which do not
warrant biopsy at this time.

## 2014-06-19 ENCOUNTER — Other Ambulatory Visit: Payer: Self-pay | Admitting: Family Medicine

## 2014-06-19 DIAGNOSIS — E041 Nontoxic single thyroid nodule: Secondary | ICD-10-CM

## 2014-06-20 ENCOUNTER — Encounter: Payer: Self-pay | Admitting: Pulmonary Disease

## 2014-06-20 ENCOUNTER — Ambulatory Visit (INDEPENDENT_AMBULATORY_CARE_PROVIDER_SITE_OTHER): Payer: Medicare Other | Admitting: Pulmonary Disease

## 2014-06-20 VITALS — BP 124/72 | HR 60 | Temp 98.5°F | Ht 60.0 in | Wt 141.8 lb

## 2014-06-20 DIAGNOSIS — Z23 Encounter for immunization: Secondary | ICD-10-CM | POA: Diagnosis not present

## 2014-06-20 DIAGNOSIS — J438 Other emphysema: Secondary | ICD-10-CM

## 2014-06-20 DIAGNOSIS — J439 Emphysema, unspecified: Secondary | ICD-10-CM

## 2014-06-20 NOTE — Assessment & Plan Note (Signed)
The patient appears to be stable on her current bronchodilator regimen, but I have stressed to her the importance of total smoking cessation. Also asked her to consider participating in some type of formal exercise and conditioning program, such as pulmonary rehabilitation.

## 2014-06-20 NOTE — Progress Notes (Signed)
   Subjective:    Patient ID: Holly [name], female    DOB: 08/10/1946, 68 y.o.   MRN: [id number]  HPI The patient comes in today for followup of her known severe COPD. She is staying on her medications compliantly, but unfortunately continues to smoke. She is considering trying to quit 100%. She has not had an acute exacerbation or or infection since the last visit a year ago. She feels that her exertional tolerance and cough are stable. She is considering entering into a COPD investigation of study, and I have told her this is okay with me provided she does not have a flareup or worsening symptoms.   Review of Systems  Constitutional: Negative for fever and unexpected weight change.  HENT: Negative for congestion, dental problem, ear pain, nosebleeds, postnasal drip, rhinorrhea, sinus pressure, sneezing, sore throat and trouble swallowing.   Eyes: Negative for redness and itching.  Respiratory: Positive for cough and shortness of breath. Negative for chest tightness and wheezing.   Cardiovascular: Negative for palpitations and leg swelling.  Gastrointestinal: Negative for nausea and vomiting.  Genitourinary: Negative for dysuria.  Musculoskeletal: Negative for joint swelling.  Skin: Negative for rash.  Neurological: Negative for headaches.  Hematological: Does not bruise/bleed easily.  Psychiatric/Behavioral: Negative for dysphoric mood. The patient is not nervous/anxious.        Objective:   Physical Exam Well-developed female in no acute distress Nose without purulence or discharge noted Neck without lymphadenopathy or thyromegaly Chest with decreased breath sounds, a few basilar crackles, no wheezing Cardiac exam with regular rate and rhythm Lower extremities with minimal edema, no cyanosis Alert and oriented, moves all 4 extremities.       Assessment & Plan:

## 2014-06-20 NOTE — Patient Instructions (Signed)
No change in current medications Keep working on total smoking cessation.  Will give you a flu shot today.  followup with me again in one year if doing well, but please call if having issues.

## 2014-07-02 ENCOUNTER — Ambulatory Visit
Admission: RE | Admit: 2014-07-02 | Discharge: 2014-07-02 | Disposition: A | Discharge status: Home / Self Care | Payer: Medicare Other | Source: Ambulatory Visit | Attending: Family Medicine | Admitting: Family Medicine

## 2014-07-02 ENCOUNTER — Other Ambulatory Visit (HOSPITAL_COMMUNITY)
Admission: RE | Admit: 2014-07-02 | Discharge: 2014-07-02 | Disposition: A | Discharge status: Home / Self Care | Payer: Medicare Other | Source: Ambulatory Visit | Attending: Interventional Radiology | Admitting: Interventional Radiology

## 2014-07-02 DIAGNOSIS — D449 Neoplasm of uncertain behavior of unspecified endocrine gland: Secondary | ICD-10-CM | POA: Diagnosis not present

## 2014-07-02 DIAGNOSIS — E041 Nontoxic single thyroid nodule: Secondary | ICD-10-CM

## 2014-07-02 DIAGNOSIS — E079 Disorder of thyroid, unspecified: Secondary | ICD-10-CM | POA: Diagnosis not present

## 2014-07-02 IMAGING — US US THYROID BIOPSY
1 series · 14 of 14 positions shown · non-contrast
Comparison: Prior thyroid ultrasound [DATE]

CLINICAL DATA: 68-year-old female with a solitary nodule on the
right which meet consensus criteria for FNA biopsy.

EXAM:
ULTRASOUND GUIDED NEEDLE ASPIRATE BIOPSY OF THE THYROID GLAND

[Series 1: us thyroid biopsy · 0.06mm/px · 14 acquisitions, 14 frames shown]
[im 1/14]
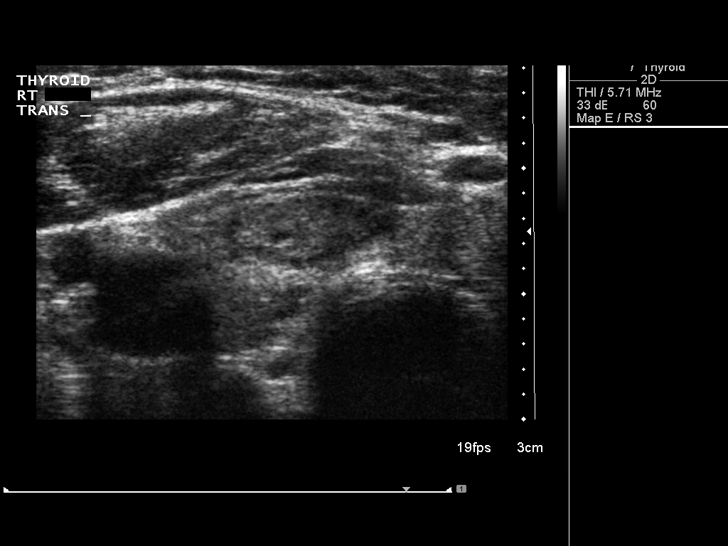
[im 2/14]
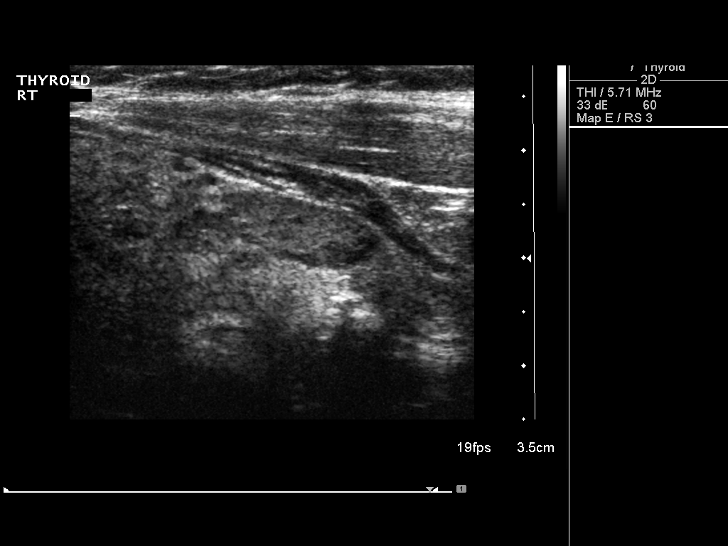
[im 3/14]
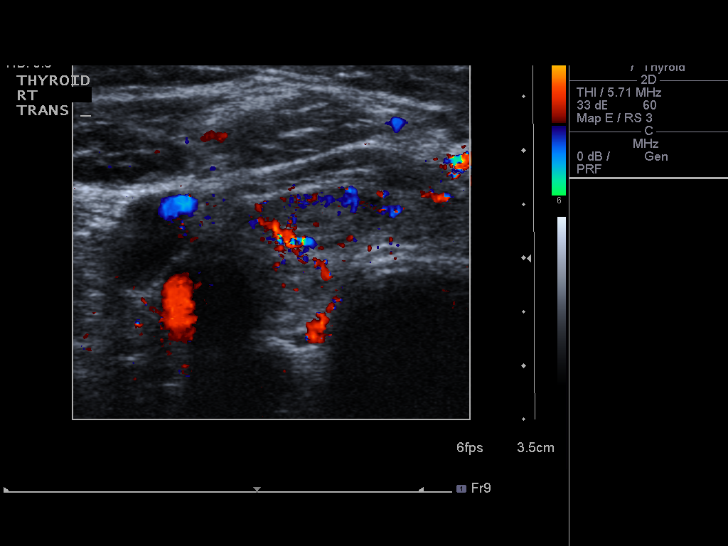
[im 4/14]
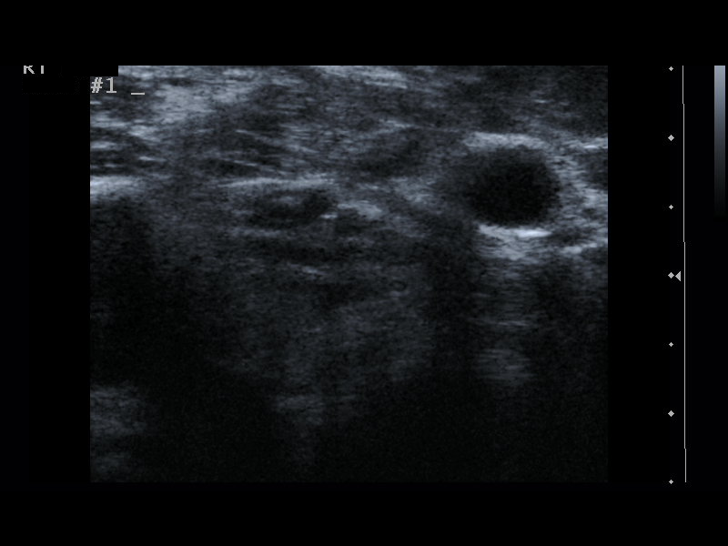
[im 5/14]
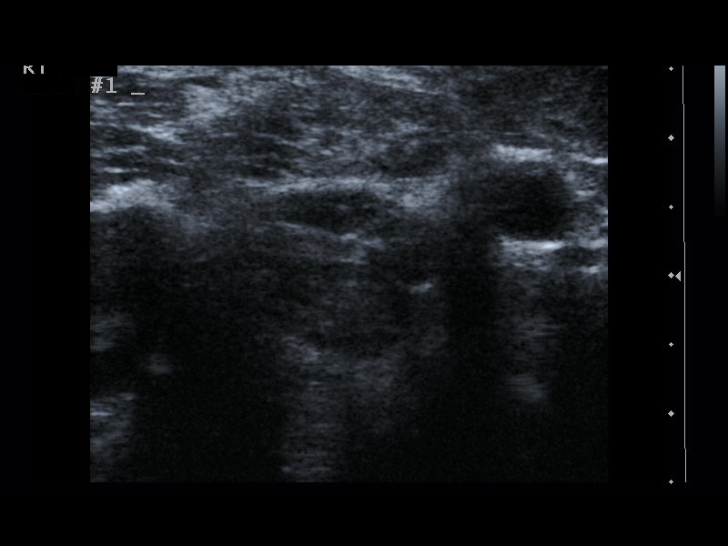
[im 6/14]
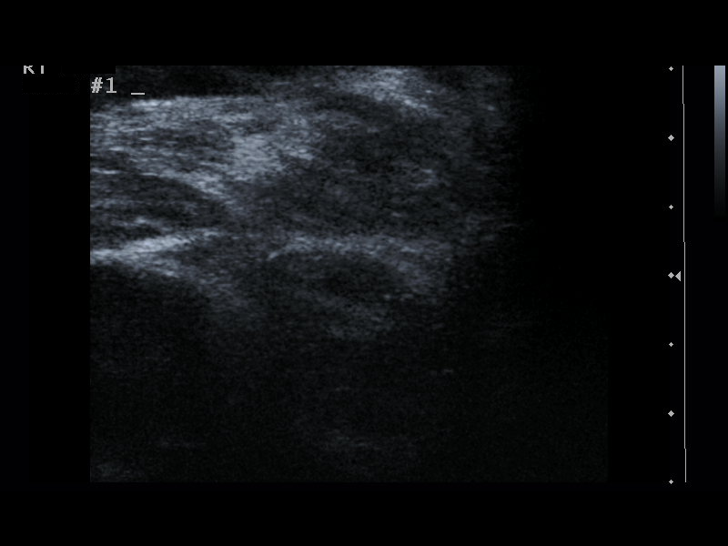
[im 7/14]
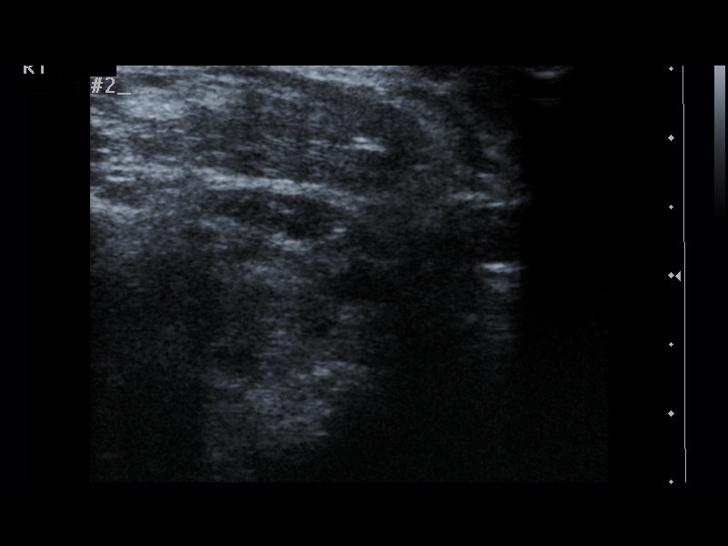
[im 8/14]
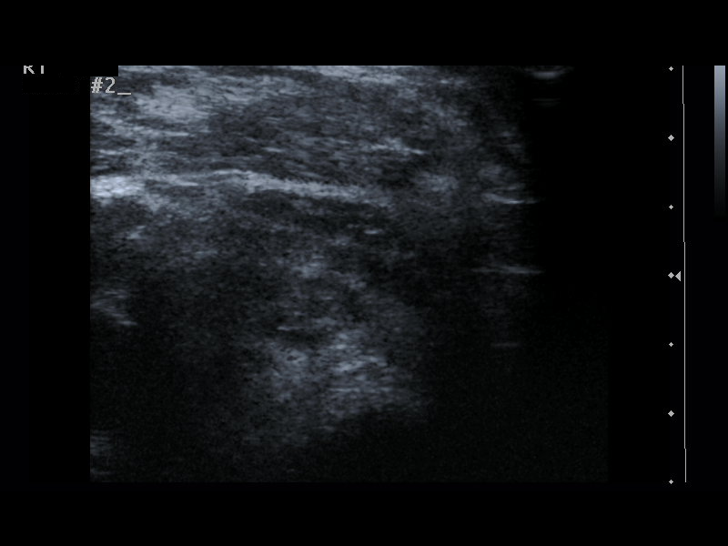
[im 9/14]
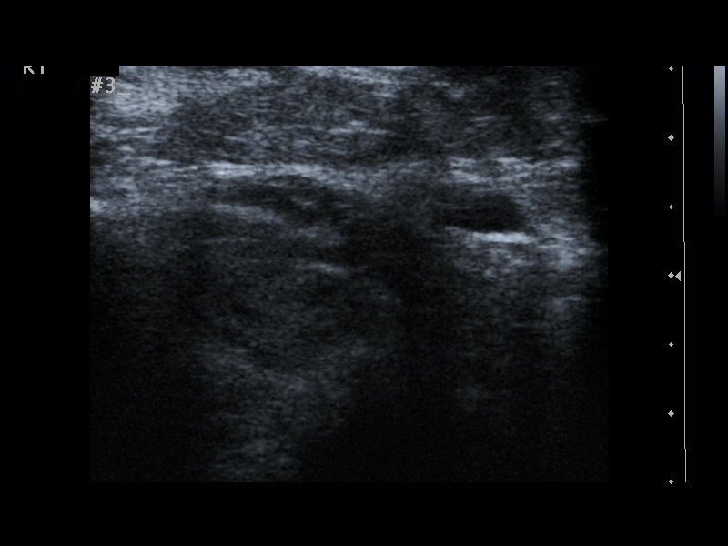
[im 10/14]
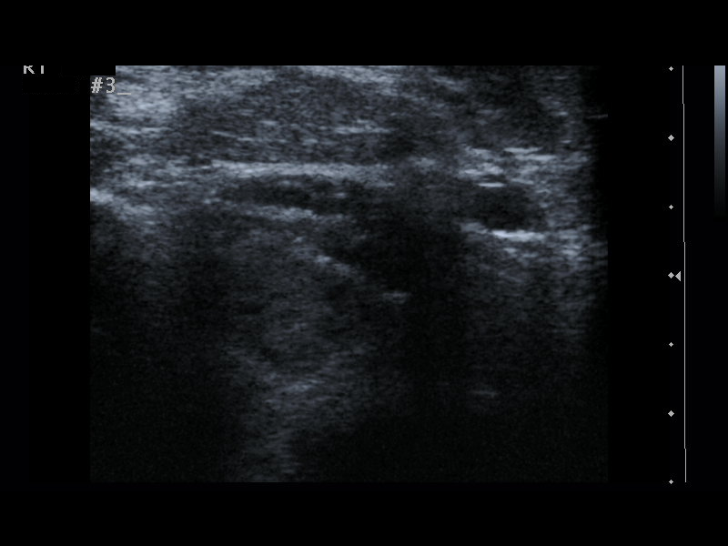
[im 11/14]
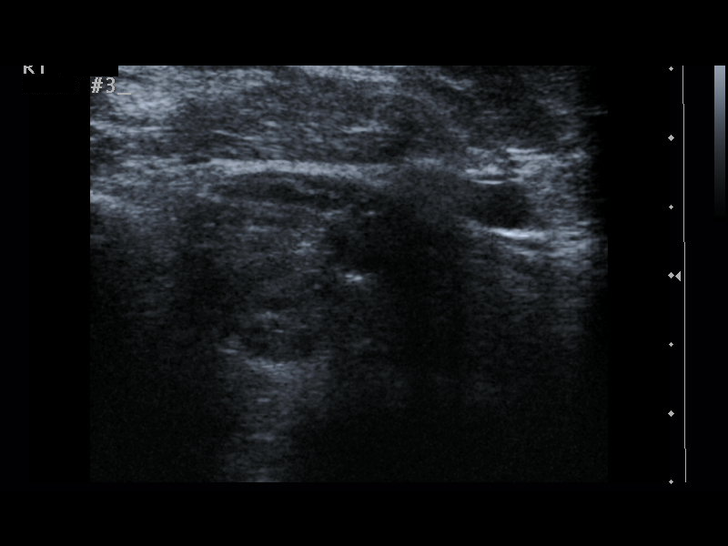
[im 12/14]
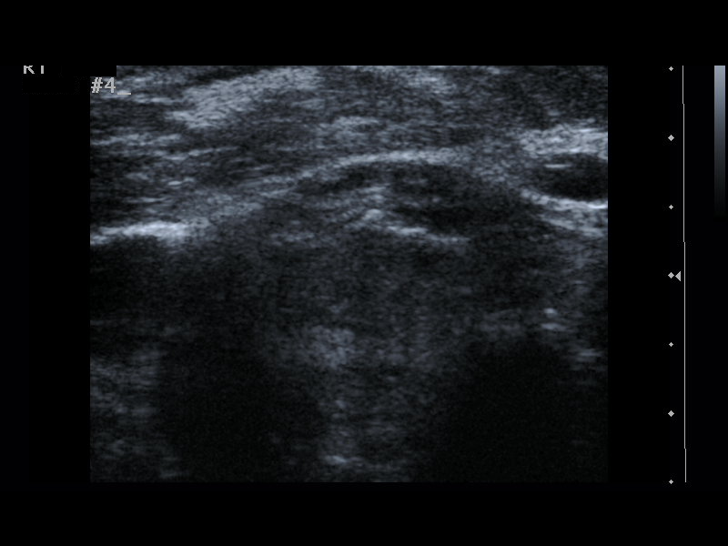
[im 13/14]
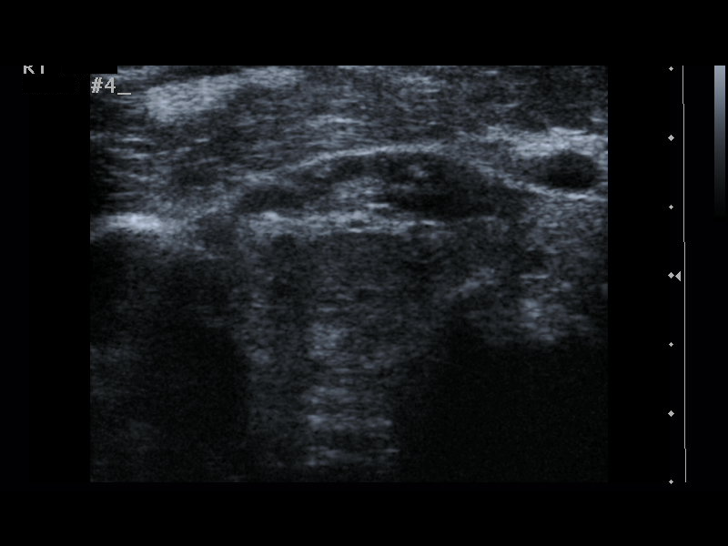
[im 14/14]
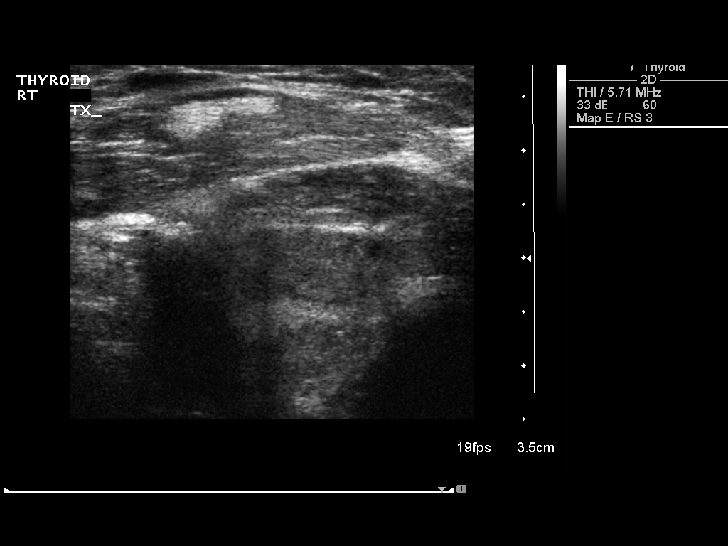

[14 of 14 positions shown; findings below may reference images not displayed]

PROCEDURE:
Thyroid biopsy was thoroughly discussed with the patient and
questions were answered. The benefits, risks, alternatives, and
complications were also discussed. The patient understands and
wishes to proceed with the procedure. Written consent was obtained.

Ultrasound was performed to localize and mark an adequate site for
the biopsy. The patient was then prepped and draped in a normal
sterile fashion. Local anesthesia was provided with 1% lidocaine.
Using direct ultrasound guidance, 4 passes were made using needles
into the nodule within the right lobe of the thyroid. Ultrasound was
used to confirm needle placements on all occasions. Specimens were
sent to Pathology for analysis.

Complications:  None
FINDINGS: Successful identification of 1.5 cm solid hypoechoic nodule in the
mid to lower right gland.
IMPRESSION: Ultrasound guided needle aspirate biopsy performed of the right
thyroid nodule.

## 2014-07-17 ENCOUNTER — Other Ambulatory Visit (INDEPENDENT_AMBULATORY_CARE_PROVIDER_SITE_OTHER): Payer: Self-pay | Admitting: Surgery

## 2014-07-17 DIAGNOSIS — E042 Nontoxic multinodular goiter: Secondary | ICD-10-CM | POA: Diagnosis not present

## 2014-07-17 DIAGNOSIS — D440 Neoplasm of uncertain behavior of thyroid gland: Secondary | ICD-10-CM | POA: Diagnosis not present

## 2014-07-22 ENCOUNTER — Other Ambulatory Visit (INDEPENDENT_AMBULATORY_CARE_PROVIDER_SITE_OTHER): Payer: Self-pay | Admitting: Surgery

## 2014-07-24 ENCOUNTER — Encounter (HOSPITAL_COMMUNITY): Payer: Self-pay | Admitting: Pharmacy Technician

## 2014-07-28 NOTE — Patient Instructions (Addendum)
Holly [name]  07/28/2014                           YOUR PROCEDURE IS SCHEDULED ON:  07/31/14                ENTER FROM [address] - GO TO [address]ARKING DECK               LOOK FOR [facility]  / [employer]                              FOLLOW  SIGNS TO SHORT STAY CENTER                 ARRIVE AT SHORT STAY AT:  8:00 AM               CALL THIS NUMBER IF ANY PROBLEMS THE DAY OF SURGERY :               [phone]                                REMEMBER:   Do not eat food or drink liquids AFTER MIDNIGHT                  Take these medicines the morning of surgery with               A SIPS OF WATER :  BISOPROLOL / WELLBUTRIN / BUSPAR / USE SYMBICORT INHALER / SPIRIVA INHALER / [place]              DISCONTINUE ASPIRIN AND HERBAL MEDS 5 DAYS PREOP       Do not wear jewelry, make-up   Do not wear lotions, powders, or perfumes.   Do not shave legs or underarms 12 hrs. before surgery (men may shave face)  Do not bring valuables to the hospital.  Contacts, dentures or bridgework may not be worn into surgery.  Leave suitcase in the car. After surgery it may be brought to your room.  For patients admitted to the hospital more than one night, checkout time is            11:00 AM                                                      ________________________________________________________________________                                                                                                  [place]  Before surgery, you can play an important role.  Because skin is not sterile, your skin needs to be as free of germs as possible.  You can reduce the number of germs on  your skin by washing with CHG (chlorahexidine gluconate) soap before surgery.  CHG is an antiseptic cleaner which kills germs and bonds with the skin to continue killing germs even after washing. Please DO NOT use if you have an allergy to CHG or  antibacterial soaps.  If your skin becomes reddened/irritated stop using the CHG and inform your nurse when you arrive at Short Stay. Do not shave (including legs and underarms) for at least 48 hours prior to the first CHG shower.  You may shave your face. Please follow these instructions carefully:   1.  Shower with CHG Soap the night before surgery and the  morning of Surgery.   2.  If you choose to wash your hair, wash your hair first as usual with your  normal  Shampoo.   3.  After you shampoo, rinse your hair and body thoroughly to remove the  shampoo.                                         4.  Use CHG as you would any other liquid soap.  You can apply chg directly  to the skin and wash . Gently wash with scrungie or clean wascloth    5.  Apply the CHG Soap to your body ONLY FROM THE NECK DOWN.   Do not use on open                           Wound or open sores. Avoid contact with eyes, ears mouth and genitals (private parts).                        Genitals (private parts) with your normal soap.              6.  Wash thoroughly, paying special attention to the area where your surgery  will be performed.   7.  Thoroughly rinse your body with warm water from the neck down.   8.  DO NOT shower/wash with your normal soap after using and rinsing off  the CHG Soap .                9.  Pat yourself dry with a clean towel.             10.  Wear clean pajamas.             11.  Place clean sheets on your bed the night of your first shower and do not  sleep with pets.  Day of Surgery : Do not apply any lotions/deodorants the morning of surgery.  Please wear clean clothes to the hospital/surgery center.  FAILURE TO FOLLOW THESE INSTRUCTIONS MAY RESULT IN THE CANCELLATION OF YOUR SURGERY    PATIENT SIGNATURE_________________________________  ______________________________________________________________________

## 2014-07-29 ENCOUNTER — Ambulatory Visit (HOSPITAL_COMMUNITY)
Admission: RE | Admit: 2014-07-29 | Discharge: 2014-07-29 | Disposition: A | Discharge status: Home / Self Care | Payer: Medicare Other | Source: Ambulatory Visit | Attending: Anesthesiology | Admitting: Anesthesiology

## 2014-07-29 ENCOUNTER — Encounter (HOSPITAL_COMMUNITY)
Admission: RE | Admit: 2014-07-29 | Discharge: 2014-07-29 | Disposition: A | Discharge status: Home / Self Care | Payer: Medicare Other | Source: Ambulatory Visit | Attending: Surgery | Admitting: Surgery

## 2014-07-29 ENCOUNTER — Encounter (HOSPITAL_COMMUNITY): Payer: Self-pay

## 2014-07-29 DIAGNOSIS — D440 Neoplasm of uncertain behavior of thyroid gland: Secondary | ICD-10-CM | POA: Insufficient documentation

## 2014-07-29 DIAGNOSIS — R0602 Shortness of breath: Secondary | ICD-10-CM | POA: Diagnosis not present

## 2014-07-29 DIAGNOSIS — Z01818 Encounter for other preprocedural examination: Secondary | ICD-10-CM | POA: Diagnosis not present

## 2014-07-29 DIAGNOSIS — Z87891 Personal history of nicotine dependence: Secondary | ICD-10-CM | POA: Diagnosis not present

## 2014-07-29 DIAGNOSIS — E042 Nontoxic multinodular goiter: Secondary | ICD-10-CM | POA: Insufficient documentation

## 2014-07-29 DIAGNOSIS — E079 Disorder of thyroid, unspecified: Secondary | ICD-10-CM

## 2014-07-29 HISTORY — DX: Spontaneous ecchymoses: R23.3

## 2014-07-29 HISTORY — DX: Unspecified asthma, uncomplicated: J45.909

## 2014-07-29 HISTORY — DX: Other skin changes: R23.8

## 2014-07-29 HISTORY — DX: Neoplasm of unspecified behavior of endocrine glands and other parts of nervous system: D49.7

## 2014-07-29 HISTORY — DX: Personal history of other malignant neoplasm of skin: Z85.828

## 2014-07-29 HISTORY — DX: Frequency of micturition: R35.0

## 2014-07-29 HISTORY — DX: Chronic obstructive pulmonary disease, unspecified: J44.9

## 2014-07-29 LAB — APTT: aPTT: 36 seconds (ref 24–37)

## 2014-07-29 LAB — PROTIME-INR
INR: 1 (ref 0.00–1.49)
Prothrombin Time: 13.3 seconds (ref 11.6–15.2)

## 2014-07-29 LAB — BASIC METABOLIC PANEL
Anion gap: 10 (ref 5–15)
BUN: 8 mg/dL (ref 6–23)
CO2: 33 mEq/L — ABNORMAL HIGH (ref 19–32)
Calcium: 10.8 mg/dL — ABNORMAL HIGH (ref 8.4–10.5)
Chloride: 100 mEq/L (ref 96–112)
Creatinine, Ser: 0.81 mg/dL (ref 0.50–1.10)
GFR calc Af Amer: 84 mL/min — ABNORMAL LOW (ref >90)
GFR calc non Af Amer: 72 mL/min — ABNORMAL LOW (ref >90)
Glucose, Bld: 123 mg/dL — ABNORMAL HIGH (ref 70–99)
Potassium: 4 mEq/L (ref 3.7–5.3)
Sodium: 143 mEq/L (ref 137–147)

## 2014-07-29 LAB — CBC
HCT: 49.8 % — ABNORMAL HIGH (ref 36.0–46.0)
Hemoglobin: 16.7 g/dL — ABNORMAL HIGH (ref 12.0–15.0)
MCH: 32.7 pg (ref 26.0–34.0)
MCHC: 33.5 g/dL (ref 30.0–36.0)
MCV: 97.6 fL (ref 78.0–100.0)
Platelets: 150 10*3/uL (ref 150–400)
RBC: 5.1 MIL/uL (ref 3.87–5.11)
RDW: 14.2 % (ref 11.5–15.5)
WBC: 7.2 10*3/uL (ref 4.0–10.5)

## 2014-07-29 IMAGING — CR DG CHEST 2V
2 series · 2 of 2 positions shown · non-contrast
Comparison: None.

CLINICAL DATA: Preop for thyroid surgery, some shortness of breath,
smoking history

EXAM:
CHEST  2 VIEW

[w chest pa]
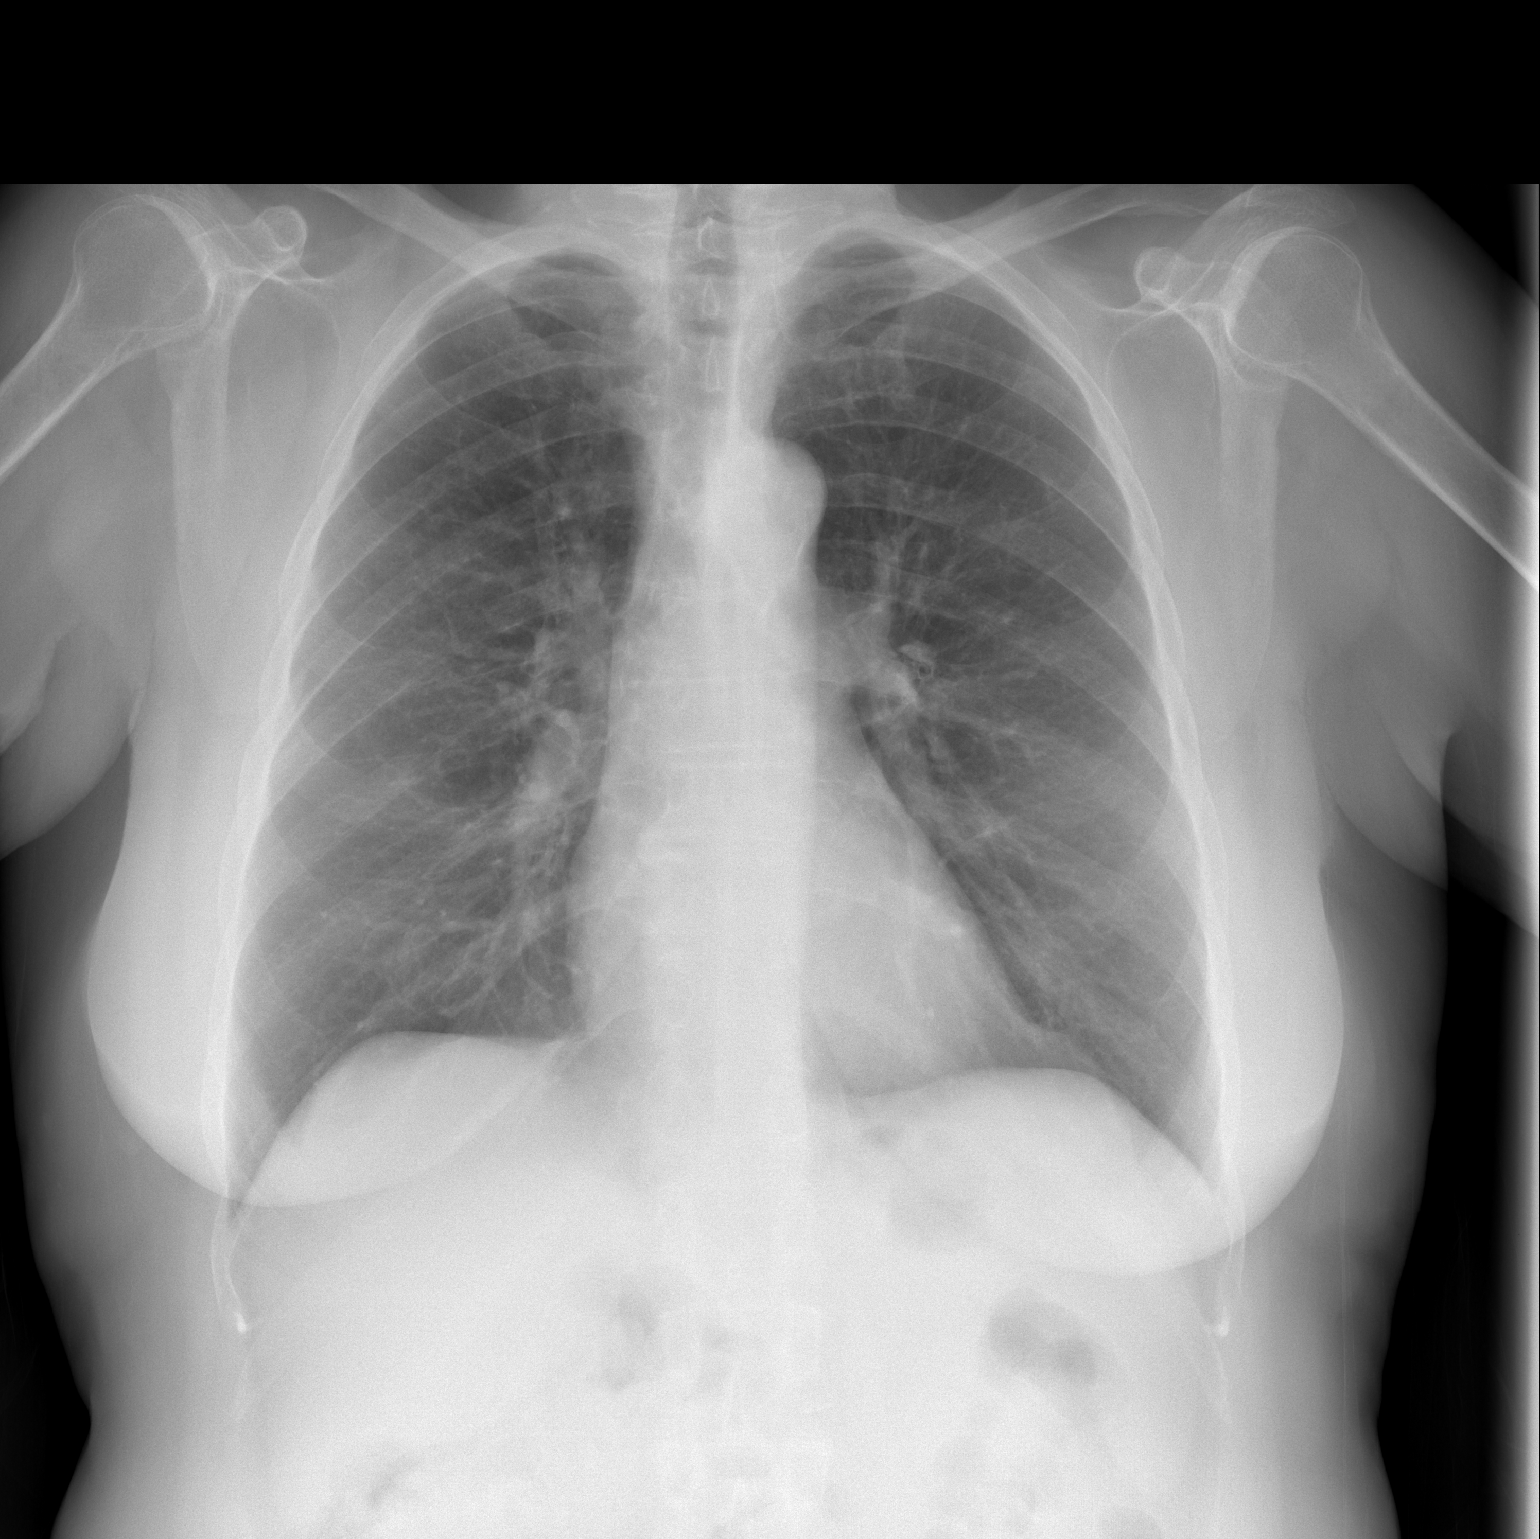

[w chest lat]
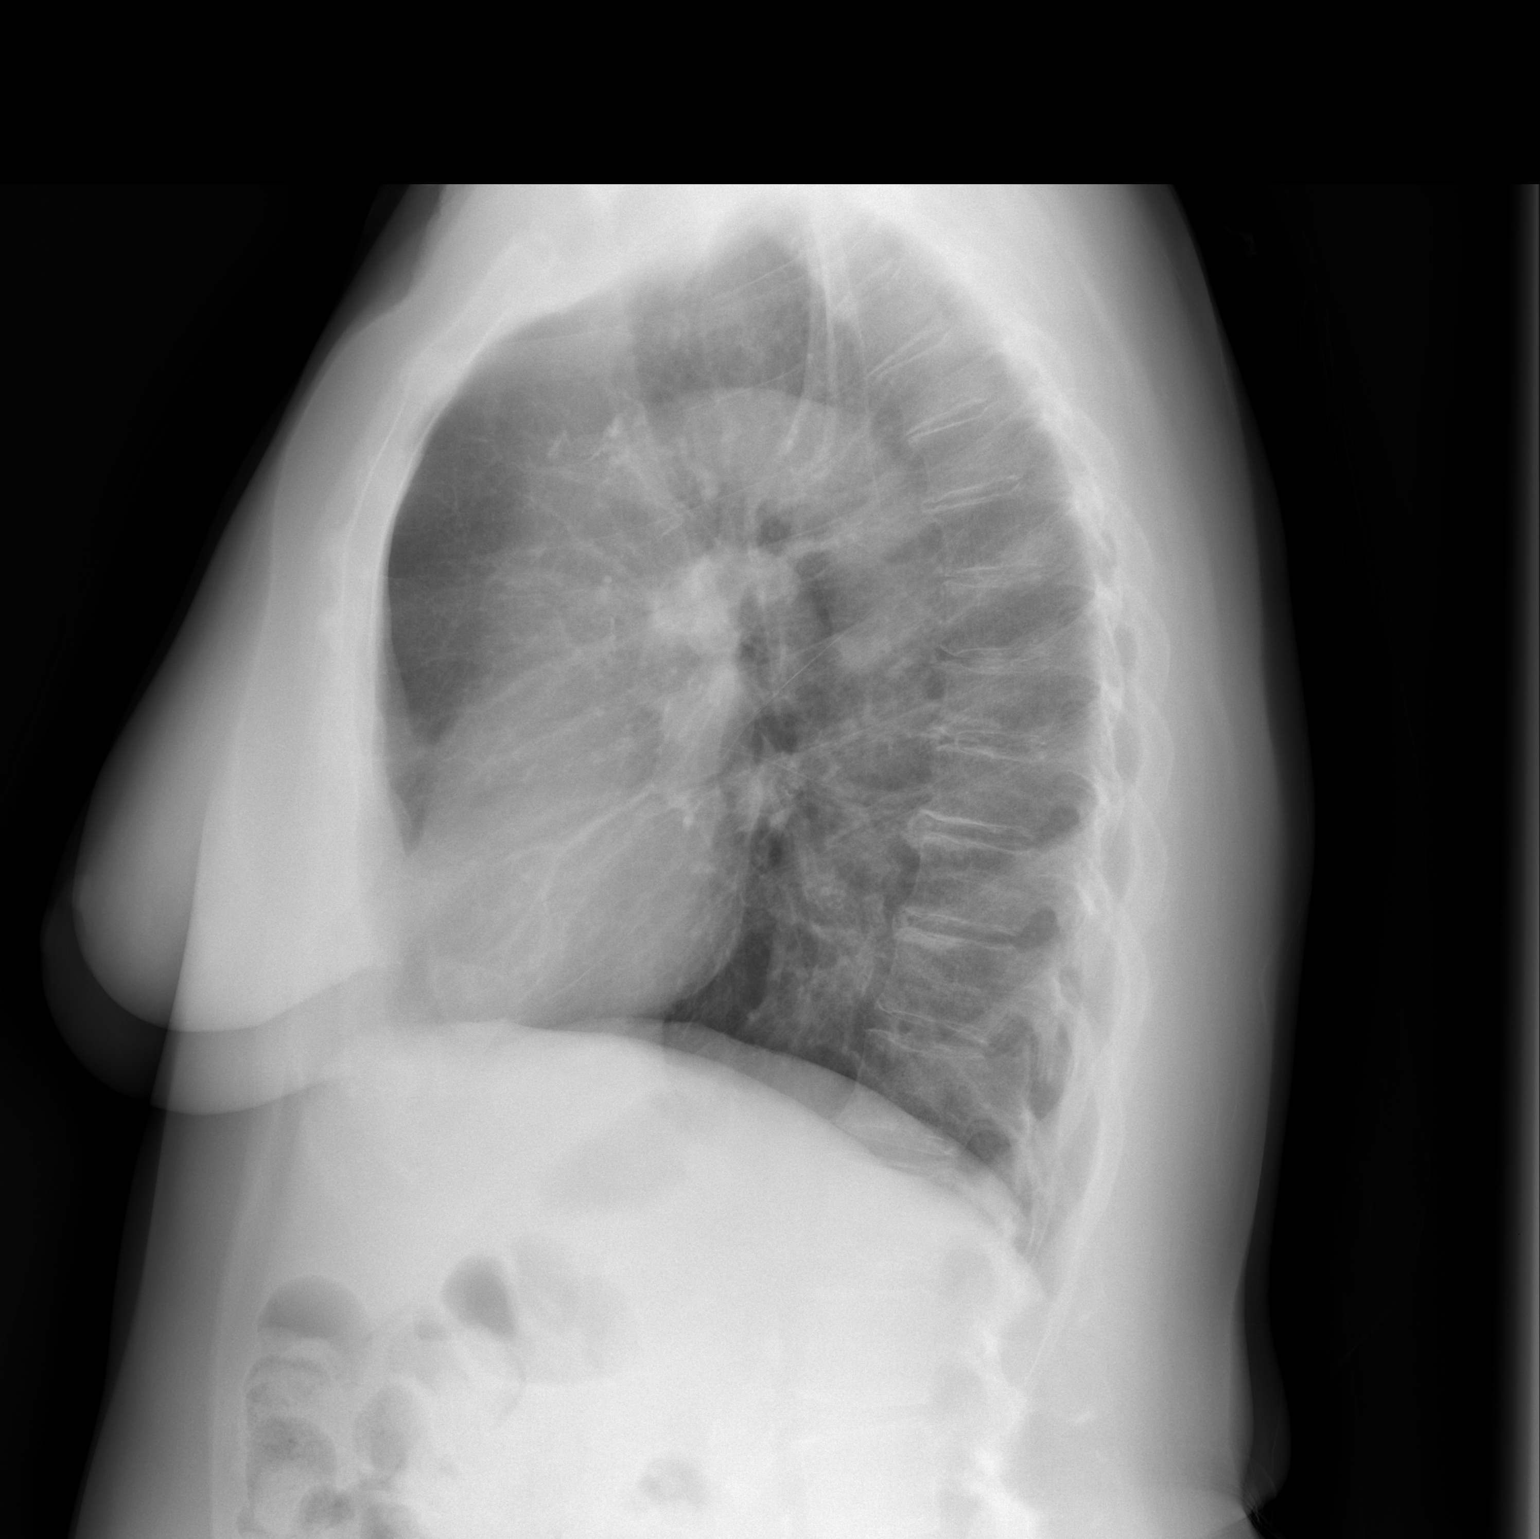

[2 of 2 positions shown; findings below may reference images not displayed]

FINDINGS: No active infiltrate or effusion is seen. Mediastinal and hilar
contours are unremarkable. The heart is within normal limits in
size. No bony abnormality is seen.
IMPRESSION: No active cardiopulmonary disease.

## 2014-07-29 NOTE — Progress Notes (Signed)
Quick Note:  EKG is acceptable for scheduled surgery.  Merial Moritz M. Dodger Sinning, MD, FACS [facility] Office: [phone]   ______ 

## 2014-07-29 NOTE — Progress Notes (Signed)
Quick Note:  These results are acceptable for scheduled surgery.  Kynlie Jane M. Zenon Leaf, MD, FACS [facility] Office: [phone]   ______ 

## 2014-07-29 NOTE — Progress Notes (Signed)
Quick Note:  Pre-operative chest x-ray is acceptable for scheduled surgery.  Mauriah Mcmillen M. Ondre Salvetti, MD, FACS [facility] Office: [phone]   ______ 

## 2014-07-30 ENCOUNTER — Encounter (HOSPITAL_COMMUNITY): Payer: Self-pay | Admitting: Surgery

## 2014-07-30 DIAGNOSIS — D440 Neoplasm of uncertain behavior of thyroid gland: Secondary | ICD-10-CM | POA: Diagnosis present

## 2014-07-30 DIAGNOSIS — E042 Nontoxic multinodular goiter: Secondary | ICD-10-CM | POA: Diagnosis present

## 2014-07-30 LAB — PARATHYROID HORMONE, INTACT (NO CA): PTH: 66 pg/mL — ABNORMAL HIGH (ref 14–64)

## 2014-07-30 NOTE — H&P (Signed)
General Surgery [facility]  Holly [name] 07/17/2014 1:41 PM Location: [facility] Patient #: [id number] DOB: 10-Feb-1946 Widowed / Language: Holly [name] / Race: Undefined Female  History of Present Illness Holly Regal MD; 07/17/2014 2:31 PM) Patient words: Thyroid nodule..  The patient is a 68 year old female who presents with a thyroid nodule. Patient is referred by Dr. Carita Pian for evaluation of thyroid nodule with cytologic atypia.  Patient was undergoing carotid duplex examination in August 2015. An incidental finding was made of multiple thyroid nodules. Subsequent thyroid ultrasound was performed. This showed bilateral subcentimeter thyroid nodules. However the dominant nodule in the right thyroid lobe measured 1.5 x 0.6 x 1.3 cm. Fine needle aspiration biopsy was obtained and showed a follicular lesion of undetermined significance, class III, with micro-follicles, nuclear overlapped, and rare nuclear grooves. Patient is now referred for consideration for resection for definitive diagnosis.  Patient has no prior history of head or neck surgery. She does have mild carotid stenosis which is being followed medically. She has had a history of hypercalcemia but evaluation has been unrevealing. Patient has never been on thyroid medication. There is no family history of thyroid disease and specifically no history of thyroid cancer. There is no family history of other endocrinopathy.  Patient denies any compressive symptoms.   Other Problems Holly [name]; 07/17/2014 1:41 PM) Asthma Chronic Obstructive Lung Disease Emphysema Of Lung High blood pressure Hypercholesterolemia Melanoma Thyroid Disease  Past Surgical History Holly [name]; 07/17/2014 1:41 PM) Hysterectomy (not due to cancer) - Partial Oral Surgery  Diagnostic Studies History Holly [name]; 07/17/2014 1:41 PM) Colonoscopy 1-5 years ago Mammogram within last  year  Social History Holly [name]; 07/17/2014 1:41 PM) Alcohol use Occasional alcohol use. Caffeine use Coffee, Tea. No drug use Tobacco use Current every day smoker.  Family History Holly [name]; 07/17/2014 1:41 PM) Alcohol Abuse Father, Sister, Son. Heart Disease Father. Heart disease in female family member before age 40 Hypertension Father, Mother. Respiratory Condition Mother.  Pregnancy / Birth History Holly [name]; 07/17/2014 1:41 PM) Age at menarche 29 years. Age of menopause <44 Gravida 2 Maternal age 48-20 Para 2  Review of Systems Holly [name]; 07/17/2014 1:41 PM) General Not Present- Appetite Loss, Chills, Fatigue, Fever, Night Sweats, Weight Gain and Weight Loss. Skin Not Present- Change in Wart/Mole, Dryness, Hives, Jaundice, New Lesions, Non-Healing Wounds, Rash and Ulcer. HEENT Not Present- Earache, Hearing Loss, Hoarseness, Nose Bleed, Oral Ulcers, Ringing in the Ears, Seasonal Allergies, Sinus Pain, Sore Throat, Visual Disturbances, Wears glasses/contact lenses and Yellow Eyes. Respiratory Present- Chronic Cough, Difficulty Breathing, Snoring and Wheezing. Not Present- Bloody sputum. Breast Not Present- Breast Mass, Breast Pain, Nipple Discharge and Skin Changes. Cardiovascular Present- Difficulty Breathing Lying Down and Shortness of Breath. Not Present- Chest Pain, Leg Cramps, Palpitations, Rapid Heart Rate and Swelling of Extremities. Gastrointestinal Present- Constipation and Gets full quickly at meals. Not Present- Abdominal Pain, Bloating, Bloody Stool, Change in Bowel Habits, Chronic diarrhea, Difficulty Swallowing, Excessive gas, Hemorrhoids, Indigestion, Nausea, Rectal Pain and Vomiting. Female Genitourinary Present- Urgency. Not Present- Frequency, Nocturia, Painful Urination and Pelvic Pain. Musculoskeletal Not Present- Back Pain, Joint Pain, Joint Stiffness, Muscle Pain, Muscle Weakness and Swelling of Extremities. Neurological Not  Present- Decreased Memory, Fainting, Headaches, Numbness, Seizures, Tingling, Tremor, Trouble walking and Weakness. Psychiatric Not Present- Anxiety, Bipolar, Change in Sleep Pattern, Depression, Fearful and Frequent crying. Endocrine Not Present- Cold Intolerance, Excessive Hunger, Hair Changes, Heat Intolerance, Hot flashes and New Diabetes. Hematology Present-  Easy Bruising. Not Present- Excessive bleeding, Gland problems, HIV and Persistent Infections.   Vitals Holly [name]; 07/17/2014 1:42 PM) 07/17/2014 1:42 PM Weight: 141.5 lb Height: 60in Body Surface Area: 1.65 m Body Mass Index: 27.63 kg/m Temp.: 98.59F(Oral)  Pulse: 72 (Regular)  Resp.: 16 (Unlabored)  BP: 118/64 (Sitting, Left Arm, Standard)    Physical Exam Holly Regal MD; 07/17/2014 2:32 PM) The physical exam findings are as follows: Note:General - appears comfortable, no distress; not diaphorectic  HEENT - normocephalic; sclerae clear, gaze conjugate; mucous membranes moist, dentition good; voice normal  Neck - symmetric on extension; no palpable anterior or posterior cervical adenopathy; palpation of the left thyroid lobe shows no discrete nodule or mass; palpation of the right thyroid lobe shows subtle inferior nodularity during swallowing without tenderness  Chest - clear bilaterally with rhonchi, rales, or wheeze  Cor - regular rhythm with normal rate; no significant murmur  Ext - non-tender without significant edema or lymphedema  Neuro - grossly intact; no tremor    Assessment & Plan Holly Regal MD; 07/17/2014 2:35 PM) MULTIPLE THYROID NODULES (E04.2) NEOPLASM OF UNCERTAIN BEHAVIOR OF THYROID GLAND (D44.0) Current Plans  I had a lengthy discussion with the patient and her friend who accompanies her today. We reviewed all of the above information in detail. I provided him with written literature to review at home.  Patient has multiple bilateral thyroid nodules. She has a  follicular lesion of undetermined significance measuring 1.5 cm in the inferior right thyroid lobe. Her chances of malignancy are approximately 25-30%. After a thorough discussion, the patient agrees with a plan to proceed with total thyroidectomy for definitive diagnosis and management. We discussed risk and benefits including recurrent laryngeal nerve injury and injury to parathyroid glands. We discussed the need for lifelong thyroid hormone replacement. We discussed the hospital stay to be anticipated and the postoperative recovery. She understands and wishes to proceed with surgery.  The risks and benefits of the procedure have been discussed at length with the patient. The patient understands the proposed procedure, potential alternative treatments, and the course of recovery to be expected. All of the patient's questions have been answered at this time. The patient wishes to proceed with surgery.   Signed by Holly Regal, MD (07/17/2014 2:37 PM)

## 2014-07-30 NOTE — Anesthesia Preprocedure Evaluation (Addendum)
Anesthesia Evaluation  Patient identified by MRN, date of birth, ID band Patient awake    Reviewed: Allergy & Precautions, H&P , NPO status , Patient's Chart, lab work & pertinent test results, reviewed documented beta blocker date and time   History of Anesthesia Complications Negative for: history of anesthetic complications  Airway Mallampati: II TM Distance: >3 FB Neck ROM: Full    Dental no notable dental hx. (+) Edentulous Upper, Missing,    Pulmonary asthma , COPD COPD inhaler, Current Smoker,  Sleeps on multiple pillows, no home O2 use, denies any recent fevers, cold, worsening symptoms   + decreased breath sounds+ wheezing      Cardiovascular hypertension, Pt. on medications and Pt. on home beta blockers Rhythm:Regular Rate:Normal     Neuro/Psych PSYCHIATRIC DISORDERS Anxiety negative neurological ROS     GI/Hepatic negative GI ROS, Neg liver ROS,   Endo/Other  negative endocrine ROS  Renal/GU negative Renal ROS  negative genitourinary   Musculoskeletal negative musculoskeletal ROS (+)   Abdominal   Peds negative pediatric ROS (+)  Hematology negative hematology ROS (+)   Anesthesia Other Findings   Reproductive/Obstetrics negative OB ROS                        Anesthesia Physical Anesthesia Plan  ASA: III  Anesthesia Plan: General   Post-op Pain Management:    Induction: Intravenous  Airway Management Planned: Oral ETT  Additional Equipment:   Intra-op Plan:   Post-operative Plan: Extubation in OR and Possible Post-op intubation/ventilation  Informed Consent: I have reviewed the patients History and Physical, chart, labs and discussed the procedure including the risks, benefits and alternatives for the proposed anesthesia with the patient or authorized representative who has indicated his/her understanding and acceptance.   Dental advisory given  Plan Discussed with:  CRNA  Anesthesia Plan Comments:        Anesthesia Quick Evaluation

## 2014-07-31 ENCOUNTER — Encounter (HOSPITAL_COMMUNITY)
Admission: RE | Disposition: A | Discharge status: Home / Self Care | Payer: Self-pay | Source: Ambulatory Visit | Attending: Surgery

## 2014-07-31 ENCOUNTER — Ambulatory Visit (HOSPITAL_COMMUNITY): Payer: Medicare Other | Admitting: Anesthesiology

## 2014-07-31 ENCOUNTER — Encounter (HOSPITAL_COMMUNITY): Payer: Medicare Other | Admitting: Anesthesiology

## 2014-07-31 ENCOUNTER — Encounter (HOSPITAL_COMMUNITY): Payer: Self-pay | Admitting: Anesthesiology

## 2014-07-31 ENCOUNTER — Observation Stay (HOSPITAL_COMMUNITY)
Admission: RE | Admit: 2014-07-31 | Discharge: 2014-08-01 | Disposition: A | Discharge status: Home / Self Care | Payer: Medicare Other | Source: Ambulatory Visit | Attending: Surgery | Admitting: Surgery

## 2014-07-31 DIAGNOSIS — D440 Neoplasm of uncertain behavior of thyroid gland: Secondary | ICD-10-CM | POA: Diagnosis present

## 2014-07-31 DIAGNOSIS — D34 Benign neoplasm of thyroid gland: Secondary | ICD-10-CM | POA: Diagnosis not present

## 2014-07-31 DIAGNOSIS — E780 Pure hypercholesterolemia: Secondary | ICD-10-CM | POA: Diagnosis not present

## 2014-07-31 DIAGNOSIS — J439 Emphysema, unspecified: Secondary | ICD-10-CM | POA: Insufficient documentation

## 2014-07-31 DIAGNOSIS — F419 Anxiety disorder, unspecified: Secondary | ICD-10-CM | POA: Insufficient documentation

## 2014-07-31 DIAGNOSIS — E042 Nontoxic multinodular goiter: Principal | ICD-10-CM | POA: Insufficient documentation

## 2014-07-31 DIAGNOSIS — F17200 Nicotine dependence, unspecified, uncomplicated: Secondary | ICD-10-CM | POA: Diagnosis not present

## 2014-07-31 DIAGNOSIS — D497 Neoplasm of unspecified behavior of endocrine glands and other parts of nervous system: Secondary | ICD-10-CM | POA: Diagnosis not present

## 2014-07-31 DIAGNOSIS — J449 Chronic obstructive pulmonary disease, unspecified: Secondary | ICD-10-CM | POA: Insufficient documentation

## 2014-07-31 DIAGNOSIS — I10 Essential (primary) hypertension: Secondary | ICD-10-CM | POA: Diagnosis not present

## 2014-07-31 HISTORY — PX: THYROIDECTOMY: SHX17

## 2014-07-31 SURGERY — THYROIDECTOMY
Anesthesia: General | Site: Neck

## 2014-07-31 MED ORDER — ACETAMINOPHEN 325 MG PO TABS
650.0000 mg | ORAL_TABLET | ORAL | Status: DC | PRN
  Filled 2014-07-31: qty 2

## 2014-07-31 MED ORDER — BUDESONIDE-FORMOTEROL FUMARATE 160-4.5 MCG/ACT IN AERO
2.0000 | INHALATION_SPRAY | Freq: Two times a day (BID) | RESPIRATORY_TRACT | Status: DC
  Administered 2014-07-31 – 2014-08-01 (×2): 2 via RESPIRATORY_TRACT
  Filled 2014-07-31: qty 6

## 2014-07-31 MED ORDER — LOSARTAN POTASSIUM 50 MG PO TABS
50.0000 mg | ORAL_TABLET | Freq: Every day | ORAL | Status: DC
  Administered 2014-08-01: 50 mg via ORAL
  Filled 2014-07-31: qty 1

## 2014-07-31 MED ORDER — BUPROPION HCL ER (XL) 300 MG PO TB24
300.0000 mg | ORAL_TABLET | Freq: Every day | ORAL | Status: DC
  Administered 2014-08-01: 300 mg via ORAL
  Filled 2014-07-31: qty 1

## 2014-07-31 MED ORDER — CALCIUM CARBONATE 1250 (500 CA) MG PO TABS
2.0000 | ORAL_TABLET | Freq: Three times a day (TID) | ORAL | Status: DC
Start: 2014-07-31 — End: 2014-08-01
  Administered 2014-07-31 – 2014-08-01 (×3): 1000 mg via ORAL
  Filled 2014-07-31 (×5): qty 2

## 2014-07-31 MED ORDER — CEFAZOLIN SODIUM-DEXTROSE 2-3 GM-% IV SOLR
2.0000 g | INTRAVENOUS | Status: AC
  Administered 2014-07-31: 2 g via INTRAVENOUS

## 2014-07-31 MED ORDER — BENAZEPRIL HCL 20 MG PO TABS
20.0000 mg | ORAL_TABLET | Freq: Every day | ORAL | Status: DC
  Administered 2014-08-01: 20 mg via ORAL
  Filled 2014-07-31: qty 1

## 2014-07-31 MED ORDER — HYDROCODONE-ACETAMINOPHEN 5-325 MG PO TABS
1.0000 | ORAL_TABLET | ORAL | Status: DC | PRN
  Administered 2014-08-01 (×2): 2 via ORAL
  Filled 2014-07-31 (×2): qty 2

## 2014-07-31 MED ORDER — 0.9 % SODIUM CHLORIDE (POUR BTL) OPTIME
TOPICAL | Status: DC | PRN
  Administered 2014-07-31: 1000 mL

## 2014-07-31 MED ORDER — MIDAZOLAM HCL 5 MG/5ML IJ SOLN
INTRAMUSCULAR | Status: DC | PRN
  Administered 2014-07-31: 2 mg via INTRAVENOUS

## 2014-07-31 MED ORDER — CEFAZOLIN SODIUM-DEXTROSE 2-3 GM-% IV SOLR
INTRAVENOUS | Status: AC
  Filled 2014-07-31: qty 50

## 2014-07-31 MED ORDER — SUCCINYLCHOLINE CHLORIDE 20 MG/ML IJ SOLN
INTRAMUSCULAR | Status: DC | PRN
  Administered 2014-07-31: 100 mg via INTRAVENOUS

## 2014-07-31 MED ORDER — SODIUM CHLORIDE 0.9 % IJ SOLN
INTRAMUSCULAR | Status: AC
  Filled 2014-07-31: qty 10

## 2014-07-31 MED ORDER — VASOPRESSIN 20 UNIT/ML IJ SOLN
INTRAMUSCULAR | Status: AC
  Filled 2014-07-31: qty 1

## 2014-07-31 MED ORDER — FENTANYL CITRATE 0.05 MG/ML IJ SOLN: 25.0000 ug | INTRAMUSCULAR | Status: DC | PRN

## 2014-07-31 MED ORDER — TIOTROPIUM BROMIDE MONOHYDRATE 18 MCG IN CAPS
18.0000 ug | ORAL_CAPSULE | Freq: Every day | RESPIRATORY_TRACT | Status: DC
  Administered 2014-08-01: 18 ug via RESPIRATORY_TRACT
  Filled 2014-07-31: qty 5

## 2014-07-31 MED ORDER — FENTANYL CITRATE 0.05 MG/ML IJ SOLN
INTRAMUSCULAR | Status: DC | PRN
  Administered 2014-07-31: 100 ug via INTRAVENOUS

## 2014-07-31 MED ORDER — METOCLOPRAMIDE HCL 5 MG/ML IJ SOLN
INTRAMUSCULAR | Status: DC | PRN
  Administered 2014-07-31: 10 mg via INTRAVENOUS

## 2014-07-31 MED ORDER — KCL IN DEXTROSE-NACL 30-5-0.45 MEQ/L-%-% IV SOLN
INTRAVENOUS | Status: DC
  Administered 2014-07-31 – 2014-08-01 (×2): via INTRAVENOUS
  Filled 2014-07-31 (×2): qty 1000

## 2014-07-31 MED ORDER — ONDANSETRON HCL 4 MG/2ML IJ SOLN
INTRAMUSCULAR | Status: DC | PRN
  Administered 2014-07-31: 4 mg via INTRAVENOUS

## 2014-07-31 MED ORDER — EPHEDRINE SULFATE 50 MG/ML IJ SOLN
INTRAMUSCULAR | Status: DC | PRN
  Administered 2014-07-31 (×4): 15 mg via INTRAVENOUS
  Administered 2014-07-31: 30 mg via INTRAVENOUS

## 2014-07-31 MED ORDER — PHENYLEPHRINE HCL 10 MG/ML IJ SOLN
INTRAMUSCULAR | Status: DC | PRN
  Administered 2014-07-31: 80 ug via INTRAVENOUS
  Administered 2014-07-31: 120 ug via INTRAVENOUS
  Administered 2014-07-31: 200 ug via INTRAVENOUS

## 2014-07-31 MED ORDER — HYDROCHLOROTHIAZIDE 12.5 MG PO CAPS
12.5000 mg | ORAL_CAPSULE | Freq: Every day | ORAL | Status: DC
  Administered 2014-08-01: 12.5 mg via ORAL
  Filled 2014-07-31: qty 1

## 2014-07-31 MED ORDER — CISATRACURIUM BESYLATE (PF) 10 MG/5ML IV SOLN
INTRAVENOUS | Status: DC | PRN
  Administered 2014-07-31: 7 mg via INTRAVENOUS

## 2014-07-31 MED ORDER — METOCLOPRAMIDE HCL 5 MG/ML IJ SOLN
INTRAMUSCULAR | Status: AC
  Filled 2014-07-31: qty 2

## 2014-07-31 MED ORDER — FENTANYL CITRATE 0.05 MG/ML IJ SOLN
INTRAMUSCULAR | Status: AC
  Filled 2014-07-31: qty 5

## 2014-07-31 MED ORDER — ALBUTEROL SULFATE (2.5 MG/3ML) 0.083% IN NEBU: 2.5000 mg | INHALATION_SOLUTION | Freq: Four times a day (QID) | RESPIRATORY_TRACT | Status: DC | PRN

## 2014-07-31 MED ORDER — EPHEDRINE SULFATE 50 MG/ML IJ SOLN
INTRAMUSCULAR | Status: AC
  Filled 2014-07-31: qty 2

## 2014-07-31 MED ORDER — PROPOFOL 10 MG/ML IV BOLUS
INTRAVENOUS | Status: DC | PRN
  Administered 2014-07-31: 130 mg via INTRAVENOUS

## 2014-07-31 MED ORDER — NICOTINE 21 MG/24HR TD PT24
21.0000 mg | MEDICATED_PATCH | Freq: Every day | TRANSDERMAL | Status: DC
  Administered 2014-07-31 – 2014-08-01 (×2): 21 mg via TRANSDERMAL
  Filled 2014-07-31 (×2): qty 1

## 2014-07-31 MED ORDER — LACTATED RINGERS IV SOLN
INTRAVENOUS | Status: DC | PRN
  Administered 2014-07-31: 10:00:00 via INTRAVENOUS

## 2014-07-31 MED ORDER — CISATRACURIUM BESYLATE 20 MG/10ML IV SOLN
INTRAVENOUS | Status: AC
  Filled 2014-07-31: qty 10

## 2014-07-31 MED ORDER — DEXAMETHASONE SODIUM PHOSPHATE 10 MG/ML IJ SOLN
INTRAMUSCULAR | Status: DC | PRN
  Administered 2014-07-31: 10 mg via INTRAVENOUS

## 2014-07-31 MED ORDER — DEXAMETHASONE SODIUM PHOSPHATE 10 MG/ML IJ SOLN
INTRAMUSCULAR | Status: AC
  Filled 2014-07-31: qty 1

## 2014-07-31 MED ORDER — BENAZEPRIL-HYDROCHLOROTHIAZIDE 20-12.5 MG PO TABS: 1.0000 | ORAL_TABLET | ORAL | Status: DC

## 2014-07-31 MED ORDER — BISOPROLOL FUMARATE 10 MG PO TABS
10.0000 mg | ORAL_TABLET | Freq: Every day | ORAL | Status: DC
  Administered 2014-08-01: 10 mg via ORAL
  Filled 2014-07-31: qty 1

## 2014-07-31 MED ORDER — HYDROMORPHONE HCL 1 MG/ML IJ SOLN
1.0000 mg | INTRAMUSCULAR | Status: DC | PRN
  Administered 2014-07-31 – 2014-08-01 (×3): 1 mg via INTRAVENOUS
  Filled 2014-07-31 (×3): qty 1

## 2014-07-31 MED ORDER — ZOLPIDEM TARTRATE 5 MG PO TABS: 5.0000 mg | ORAL_TABLET | Freq: Every evening | ORAL | Status: DC | PRN

## 2014-07-31 MED ORDER — ONDANSETRON HCL 4 MG/2ML IJ SOLN: 4.0000 mg | Freq: Once | INTRAMUSCULAR | Status: DC | PRN

## 2014-07-31 MED ORDER — BUSPIRONE HCL 15 MG PO TABS
30.0000 mg | ORAL_TABLET | Freq: Two times a day (BID) | ORAL | Status: DC
  Administered 2014-07-31 – 2014-08-01 (×2): 30 mg via ORAL
  Filled 2014-07-31 (×3): qty 2

## 2014-07-31 MED ORDER — ONDANSETRON HCL 4 MG/2ML IJ SOLN
4.0000 mg | Freq: Four times a day (QID) | INTRAMUSCULAR | Status: DC | PRN
  Administered 2014-07-31 – 2014-08-01 (×3): 4 mg via INTRAVENOUS
  Filled 2014-07-31 (×3): qty 2

## 2014-07-31 MED ORDER — ONDANSETRON HCL 4 MG PO TABS: 4.0000 mg | ORAL_TABLET | Freq: Four times a day (QID) | ORAL | Status: DC | PRN

## 2014-07-31 MED ORDER — PHENYLEPHRINE 40 MCG/ML (10ML) SYRINGE FOR IV PUSH (FOR BLOOD PRESSURE SUPPORT)
PREFILLED_SYRINGE | INTRAVENOUS | Status: AC
  Filled 2014-07-31: qty 10

## 2014-07-31 MED ORDER — ONDANSETRON HCL 4 MG/2ML IJ SOLN
INTRAMUSCULAR | Status: AC
  Filled 2014-07-31: qty 2

## 2014-07-31 MED ORDER — MIDAZOLAM HCL 2 MG/2ML IJ SOLN
INTRAMUSCULAR | Status: AC
  Filled 2014-07-31: qty 2

## 2014-07-31 MED ORDER — PROPOFOL 10 MG/ML IV BOLUS
INTRAVENOUS | Status: AC
  Filled 2014-07-31: qty 20

## 2014-07-31 SURGICAL SUPPLY — 41 items
ADH SKN CLS APL DERMABOND .7 (GAUZE/BANDAGES/DRESSINGS) ×1
APL SKNCLS STERI-STRIP NONHPOA (GAUZE/BANDAGES/DRESSINGS) ×1
ATTRACTOMAT 16X20 MAGNETIC DRP (DRAPES) ×2 IMPLANT
BENZOIN TINCTURE PRP APPL 2/3 (GAUZE/BANDAGES/DRESSINGS) ×2 IMPLANT
BLADE HEX COATED 2.75 (ELECTRODE) ×2 IMPLANT
BLADE SURG 15 STRL LF DISP TIS (BLADE) ×1 IMPLANT
BLADE SURG 15 STRL SS (BLADE) ×2
CANISTER SUCTION 2500CC (MISCELLANEOUS) ×1 IMPLANT
CHLORAPREP W/TINT 26ML (MISCELLANEOUS) ×2 IMPLANT
CLIP TI MEDIUM 6 (CLIP) ×4 IMPLANT
CLIP TI WIDE RED SMALL 6 (CLIP) ×6 IMPLANT
DERMABOND ADVANCED (GAUZE/BANDAGES/DRESSINGS) ×1
DERMABOND ADVANCED .7 DNX12 (GAUZE/BANDAGES/DRESSINGS) IMPLANT
DISSECTOR ROUND CHERRY 3/8 STR (MISCELLANEOUS) IMPLANT
DRAPE PED LAPAROTOMY (DRAPES) ×2 IMPLANT
DRESSING SURGICEL FIBRLLR 1X2 (HEMOSTASIS) ×1 IMPLANT
DRSG SURGICEL FIBRILLAR 1X2 (HEMOSTASIS) ×2
ELECT REM PT RETURN 9FT ADLT (ELECTROSURGICAL) ×2
ELECTRODE REM PT RTRN 9FT ADLT (ELECTROSURGICAL) ×1 IMPLANT
GAUZE SPONGE 4X4 12PLY STRL (GAUZE/BANDAGES/DRESSINGS) IMPLANT
GAUZE SPONGE 4X4 16PLY XRAY LF (GAUZE/BANDAGES/DRESSINGS) ×2 IMPLANT
GLOVE SURG ORTHO 8.0 STRL STRW (GLOVE) ×2 IMPLANT
GOWN STRL REUS W/TWL XL LVL3 (GOWN DISPOSABLE) ×4 IMPLANT
KIT BASIN OR (CUSTOM PROCEDURE TRAY) ×2 IMPLANT
MANIFOLD NEPTUNE II (INSTRUMENTS) ×1 IMPLANT
NS IRRIG 1000ML POUR BTL (IV SOLUTION) ×2 IMPLANT
PACK BASIC VI WITH GOWN DISP (CUSTOM PROCEDURE TRAY) ×2 IMPLANT
PENCIL BUTTON HOLSTER BLD 10FT (ELECTRODE) ×2 IMPLANT
SHEARS HARMONIC 9CM CVD (BLADE) ×2 IMPLANT
STAPLER VISISTAT 35W (STAPLE) IMPLANT
STRIP CLOSURE SKIN 1/2X4 (GAUZE/BANDAGES/DRESSINGS) ×2 IMPLANT
SUT MNCRL AB 4-0 PS2 18 (SUTURE) ×2 IMPLANT
SUT SILK 2 0 (SUTURE)
SUT SILK 2-0 18XBRD TIE 12 (SUTURE) IMPLANT
SUT SILK 3 0 (SUTURE)
SUT SILK 3-0 18XBRD TIE 12 (SUTURE) IMPLANT
SUT VIC AB 3-0 SH 18 (SUTURE) ×4 IMPLANT
SYR BULB IRRIGATION 50ML (SYRINGE) ×2 IMPLANT
TOWEL OR 17X26 10 PK STRL BLUE (TOWEL DISPOSABLE) ×2 IMPLANT
TOWEL OR NON WOVEN STRL DISP B (DISPOSABLE) ×2 IMPLANT
YANKAUER SUCT BULB TIP 10FT TU (MISCELLANEOUS) ×2 IMPLANT

## 2014-07-31 NOTE — Anesthesia Postprocedure Evaluation (Signed)
  Anesthesia Post-op Note  Patient: Holly [name]  Procedure(s) Performed: Procedure(s) (LRB): TOTAL THYROIDECTOMY (N/A)  Patient Location: PACU  Anesthesia Type: General  Level of Consciousness: awake and alert   Airway and Oxygen Therapy: Patient Spontanous Breathing  Post-op Pain: mild  Post-op Assessment: Post-op Vital signs reviewed, Patient's Cardiovascular Status Stable, Respiratory Function Stable, Patent Airway and No signs of Nausea or vomiting  Last Vitals:  Filed Vitals:   07/31/14 1400  BP: 166/64  Pulse: 69  Temp: 36.4 C  Resp: 18    Post-op Vital Signs: stable   Complications: No apparent anesthesia complications

## 2014-07-31 NOTE — Op Note (Signed)
Holly [name], Holly [name]                 ACCOUNT NO.:  [id number]  MEDICAL RECORD NO.:  [id number]  LOCATION:  5643                         FACILITY:  [facility]  PHYSICIAN:  [name], MD      DATE OF BIRTH:  22-Apr-1946  DATE OF PROCEDURE:  07/31/2014                               OPERATIVE REPORT   PREOPERATIVE DIAGNOSIS:  Multinodular thyroid with cytologic atypia.  POSTOPERATIVE DIAGNOSIS:  Multinodular thyroid with cytologic atypia.  PROCEDURE:  Total thyroidectomy.  SURGEON:  Armandina Gemma, MD, FACS  ANESTHESIA:  General.  ESTIMATED BLOOD LOSS:  Minimal.  PREPARATION:  ChloraPrep.  COMPLICATIONS:  None.  INDICATIONS:  The patient is a 68 year old female referred by Dr. Harlan Stains for evaluation of bilateral thyroid nodules.  Ultrasound showed a dominant right nodule measuring 1.5 cm.  Aspiration biopsy showed a follicular lesion with nuclear overlap and nuclear grooves. The patient now comes to Surgery for resection for definitive diagnosis.  BODY OF REPORT:  Procedure was done in OR #6 at the [facility] [place].  The patient was brought to the operating room, placed in supine position on the operating room table.  Following administration of general anesthesia, the patient was positioned and then prepped and draped in the usual aseptic fashion.  After ascertaining that an adequate level of anesthesia had been achieved, a Kocher incision was made with a #15 blade.  Dissection was carried through subcutaneous tissues and platysma.  Hemostasis was achieved with the electrocautery.  Skin flaps were elevated cephalad and caudad from the thyroid notch to the sternal notch.  The Mahorner self-retaining retractor was placed for exposure.  Strap muscles were incised in the midline and dissection was begun on the left side.  Strap muscles were reflected laterally exposing the left thyroid lobe.  Left lobe was essentially normal in size but contains multiple nodules.   It was gently mobilized with blunt dissection.  Middle thyroid vein was divided between Ligaclips with the Harmonic scalpel.  Superior pole vessels were dissected out individually and divided between small and medium Ligaclips with the Harmonic scalpel.  Gland was mobilized and rolled anteriorly.  Branches of the inferior thyroid artery were divided between small Ligaclips.  Recurrent laryngeal nerve was identified and preserved.  Inferior venous tributaries were divided between Ligaclips. Ligament of Gwenlyn Found was released with the electrocautery and the gland was mobilized onto the anterior trachea.  Isthmus was mobilized across the midline.  There was no significant pyramidal lobe present.  Dry pack was placed in the left neck.  Next, we turned our attention to the right thyroid lobe.  Again the right lobe was essentially normal in size but does contain multiple nodules.  It was gently mobilized with blunt dissection and the middle thyroid vein divided between medium Ligaclips with the Harmonic scalpel. Gland was dissected out and superior pole was mobilized.  Superior pole vessels were divided individually between small and medium Ligaclips with the Harmonic scalpel.  Inferior venous tributaries were divided between medium Ligaclips.  Gland was rolled anteriorly.  Branches of the inferior thyroid artery were divided between small Ligaclips.  Ligament of Gwenlyn Found was released with the electrocautery and the  gland was mobilized onto the anterior trachea from which it was completely excised with the Harmonic scalpel.  Suture was used to mark the left superior pole.  The entire thyroid gland was submitted to Pathology for review.  The neck was irrigated with warm saline and good hemostasis was achieved throughout the operative field.  Fibrillar was placed throughout the operative field.  Strap muscles were reapproximated in the midline with interrupted 3-0 Vicryl sutures.  Platysma was closed  with interrupted 3- 0 Vicryl sutures.  Skin was closed with a running 4-0 Monocryl subcuticular suture.  Wound was washed and dried.  The wound was dressed with Dermabond topically.  There was a small skin tear due to the patient's fragile thin skin just below the level of the cervical incision.  This was closed with a Steri-Strip.  The patient was awakened from anesthesia and brought to the recovery room.  The patient tolerated the procedure well.   [name], MD, [facility] Office: [phone]    TMG/MEDQ  D:  07/31/2014  T:  07/31/2014  Job:  [id number]  cc:   Emeline General. Dema Severin, M.D. Fax: [phone]

## 2014-07-31 NOTE — Brief Op Note (Signed)
07/31/2014  11:58 AM  PATIENT:  Jayci N Bacallao  68 y.o. female  PRE-OPERATIVE DIAGNOSIS:  Multinodular thyroid with atypia  POST-OPERATIVE DIAGNOSIS:  Same   PROCEDURE:  Procedure(s): TOTAL THYROIDECTOMY (N/A)  SURGEON:  Surgeon(s) and Role:    * Armandina Gemma, MD - Primary  ANESTHESIA:   general  EBL:     BLOOD ADMINISTERED:none  DRAINS: none   LOCAL MEDICATIONS USED:  NONE  SPECIMEN:  Excision  DISPOSITION OF SPECIMEN:  PATHOLOGY  COUNTS:  YES  TOURNIQUET:  * No tourniquets in log *  DICTATION: .Other Dictation: Dictation Number [id number]  PLAN OF CARE: Admit for overnight observation  PATIENT DISPOSITION:  PACU - hemodynamically stable.   Delay start of Pharmacological VTE agent (>24hrs) due to surgical blood loss or risk of bleeding: yes  [name], MD, [facility] Office: [phone]

## 2014-07-31 NOTE — Interval H&P Note (Signed)
History and Physical Interval Note:  07/31/2014 7:34 AM  Holly [name]  has presented today for surgery, with the diagnosis of thyroid neoplasm wtih atypia.  The various methods of treatment have been discussed with the patient and family. After consideration of risks, benefits and other options for treatment, the patient has consented to    Procedure(s): TOTAL THYROIDECTOMY (N/A) as a surgical intervention .    The patient's history has been reviewed, patient examined, no change in status, stable for surgery.  I have reviewed the patient's chart and labs.  Questions were answered to the patient's satisfaction.    [name], MD, [facility] Office: [place]

## 2014-07-31 NOTE — Transfer of Care (Signed)
Immediate Anesthesia Transfer of Care Note  Patient: Holly [name]  Procedure(s) Performed: Procedure(s): TOTAL THYROIDECTOMY (N/A)  Patient Location: PACU  Anesthesia Type:General  Level of Consciousness: awake, sedated and patient cooperative  Airway & Oxygen Therapy: Patient Spontanous Breathing and Patient connected to face mask oxygen  Post-op Assessment: Report given to PACU RN and Post -op Vital signs reviewed and stable  Post vital signs: Reviewed and stable  Complications: No apparent anesthesia complications

## 2014-08-01 ENCOUNTER — Encounter (HOSPITAL_COMMUNITY): Payer: Self-pay | Admitting: Surgery

## 2014-08-01 ENCOUNTER — Other Ambulatory Visit: Payer: Self-pay

## 2014-08-01 DIAGNOSIS — E042 Nontoxic multinodular goiter: Secondary | ICD-10-CM | POA: Diagnosis not present

## 2014-08-01 LAB — BASIC METABOLIC PANEL
Anion gap: 10 (ref 5–15)
BUN: 12 mg/dL (ref 6–23)
CO2: 28 mEq/L (ref 19–32)
Calcium: 9.8 mg/dL (ref 8.4–10.5)
Chloride: 97 mEq/L (ref 96–112)
Creatinine, Ser: 0.7 mg/dL (ref 0.50–1.10)
GFR calc Af Amer: >90 mL/min (ref >90)
GFR calc non Af Amer: 86 mL/min — ABNORMAL LOW (ref >90)
Glucose, Bld: 133 mg/dL — ABNORMAL HIGH (ref 70–99)
Potassium: 4.5 mEq/L (ref 3.7–5.3)
Sodium: 135 mEq/L — ABNORMAL LOW (ref 137–147)

## 2014-08-01 MED ORDER — SYNTHROID 75 MCG PO TABS: 75.0000 ug | ORAL_TABLET | Freq: Every day | ORAL | Status: DC

## 2014-08-01 MED ORDER — CALCIUM CARBONATE-VITAMIN D 500-200 MG-UNIT PO TABS: 2.0000 | ORAL_TABLET | Freq: Two times a day (BID) | ORAL | Status: DC

## 2014-08-01 MED ORDER — TRAMADOL HCL 50 MG PO TABS: 50.0000 mg | ORAL_TABLET | Freq: Four times a day (QID) | ORAL | Status: DC | PRN

## 2014-08-01 NOTE — Discharge Summary (Signed)
Physician Discharge Summary [facility]  Patient ID: Holly [name] MRN: [id number] DOB/AGE: 1946-04-01 68 y.o.  Admit date: 07/31/2014 Discharge date: 08/01/2014  Admission Diagnoses:  Multinodular thyroid with atypia  Discharge Diagnoses:  Principal Problem:   Neoplasm of uncertain behavior of thyroid gland, right lobe Active Problems:   Multinodular thyroid   Discharged Condition: good  Hospital Course: Patient was admitted for observation following thyroid surgery.  Post op course was uncomplicated.  Pain was well controlled.  Tolerated diet.  Post op calcium level on morning following surgery was 9.8 mg/dl.  Patient was prepared for discharge home on POD#1.  Consults: None  Treatments: surgery: total thyroidectomy  Discharge Exam: Blood pressure 145/61, pulse 72, temperature 97.9 F (36.6 C), temperature source Oral, resp. rate 18, height 4\' 11"  (1.499 m), weight 140 lb 4 oz (63.617 kg), SpO2 95.00%. HEENT - clear Neck - wound intact with Dermabond; mild eccymosis; mild STS; voice normal Chest - clear bilaterally Cor - RRR   Disposition: Home  Discharge Instructions    Remove dressing in 72 hours    Complete by:  As directed   Remove band-aid in shower.     Diet - low sodium heart healthy    Complete by:  As directed      Discharge instructions    Complete by:  As directed   THYROID & PARATHYROID SURGERY - POST OP INSTRUCTIONS  Always review your discharge instruction sheet from the facility where your surgery was performed.  A prescription for pain medication may be given to you upon discharge.  Take your pain medication as prescribed.  If narcotic pain medicine is not needed, then you may take acetaminophen (Tylenol) or ibuprofen (Advil) as needed.  Take your usually prescribed medications unless otherwise directed.  If you need a refill on your pain medication, please contact your pharmacy. They will contact our office to request  authorization.  Prescriptions will not be processed after 5 pm or on weekends.  Start with a light diet upon arrival home, such as soup and crackers or toast.  Be sure to drink plenty of fluids daily.  Resume your normal diet the day after surgery.  Most patients will experience some swelling and bruising on the chest and neck area.  Ice packs will help.  Swelling and bruising can take several days to resolve.   It is common to experience some constipation if taking pain medication after surgery.  Increasing fluid intake and taking a stool softener will usually help or prevent this problem.  A mild laxative (Milk of Magnesia or Miralax) should be taken according to package directions if there are no bowel movements after 48 hours.  You may remove your bandages 24-48 hours after surgery, and you may shower at that time.  You have steri-strips (small skin tapes) in place directly over the incision.  These strips should be left on the skin for 7-10 days and then removed.  You may resume regular (light) daily activities beginning the next day-such as daily self-care, walking, climbing stairs-gradually increasing activities as tolerated.  You may have sexual intercourse when it is comfortable.  Refrain from any heavy lifting or straining until approved by your doctor.  You may drive when you no longer are taking prescription pain medication, you can comfortably wear a seatbelt, and you can safely maneuver your car and apply brakes.  You should see your doctor in the office for a follow-up appointment approximately two to three weeks after  your surgery.  Make sure that you call for this appointment within a day or two after you arrive home to insure a convenient appointment time.  WHEN TO CALL YOUR DOCTOR: -- Fever greater than 101.5 -- Inability to urinate -- Nausea and/or vomiting - persistent -- Extreme swelling or bruising -- Continued bleeding from incision -- Increased pain, redness, or drainage  from the incision -- Difficulty swallowing or breathing -- Muscle cramping or spasms -- Numbness or tingling in hands or around lips  The clinic staff is available to answer your questions during regular business hours.  Please don't hesitate to call and ask to speak to one of the nurses if you have concerns.  [name], MD, [facility] Office: [phone]     Increase activity slowly    Complete by:  As directed             Medication List         albuterol 108 (90 BASE) MCG/ACT inhaler  Commonly known as:  PROVENTIL HFA;VENTOLIN HFA  Inhale 2 puffs into the lungs every 6 (six) hours as needed for wheezing or shortness of breath.     aspirin 81 MG tablet  Take 81 mg by mouth every morning.     benazepril-hydrochlorthiazide 20-12.5 MG per tablet  Commonly known as:  LOTENSIN HCT  Take 1 tablet by mouth every morning.     bisoprolol 10 MG tablet  Commonly known as:  ZEBETA  Take 10 mg by mouth every morning.     budesonide-formoterol 160-4.5 MCG/ACT inhaler  Commonly known as:  SYMBICORT  Inhale 2 puffs into the lungs 2 (two) times daily.     buPROPion 300 MG 24 hr tablet  Commonly known as:  WELLBUTRIN XL  Take 300 mg by mouth every morning.     busPIRone 30 MG tablet  Commonly known as:  BUSPAR  Take 30 mg by mouth 2 (two) times daily.     calcium-vitamin D 500-200 MG-UNIT per tablet  Commonly known as:  OSCAL WITH D  Take 2 tablets by mouth 2 (two) times daily.     diphenhydramine-acetaminophen 25-500 MG Tabs  Commonly known as:  TYLENOL PM  Take 1 tablet by mouth at bedtime as needed (sleep).     losartan 50 MG tablet  Commonly known as:  COZAAR  Take 50 mg by mouth every morning.     nicotine 21 mg/24hr patch  Commonly known as:  NICODERM CQ - dosed in mg/24 hours  Place 21 mg onto the skin daily.     pravastatin 40 MG tablet  Commonly known as:  PRAVACHOL  Take 40 mg by mouth at bedtime.      SYNTHROID 75 MCG tablet  Generic drug:  levothyroxine  Take 1 tablet (75 mcg total) by mouth daily before breakfast.     tiotropium 18 MCG inhalation capsule  Commonly known as:  SPIRIVA  Place 18 mcg into inhaler and inhale daily.     traMADol 50 MG tablet  Commonly known as:  ULTRAM  Take 1 tablet (50 mg total) by mouth every 6 (six) hours as needed for moderate pain.     VITAMIN B 12 PO  Take 1 tablet by mouth every morning.     VITAMIN D-3 PO  Take 1 tablet by mouth every morning.     zolpidem 10 MG tablet  Commonly known as:  AMBIEN  Take 5 mg by mouth  at bedtime as needed for sleep.         [name], MD, [facility] Office: [phone]   Signed: [name] 08/01/2014, 1:08 PM

## 2014-08-04 ENCOUNTER — Other Ambulatory Visit (INDEPENDENT_AMBULATORY_CARE_PROVIDER_SITE_OTHER): Payer: Self-pay

## 2014-08-07 DIAGNOSIS — G479 Sleep disorder, unspecified: Secondary | ICD-10-CM | POA: Diagnosis not present

## 2014-08-07 DIAGNOSIS — F419 Anxiety disorder, unspecified: Secondary | ICD-10-CM | POA: Diagnosis not present

## 2014-08-07 DIAGNOSIS — E785 Hyperlipidemia, unspecified: Secondary | ICD-10-CM | POA: Diagnosis not present

## 2014-08-07 DIAGNOSIS — I10 Essential (primary) hypertension: Secondary | ICD-10-CM | POA: Diagnosis not present

## 2014-08-07 DIAGNOSIS — K602 Anal fissure, unspecified: Secondary | ICD-10-CM | POA: Diagnosis not present

## 2014-08-07 DIAGNOSIS — E890 Postprocedural hypothyroidism: Secondary | ICD-10-CM | POA: Diagnosis not present

## 2014-08-07 DIAGNOSIS — R1084 Generalized abdominal pain: Secondary | ICD-10-CM | POA: Diagnosis not present

## 2014-08-11 ENCOUNTER — Emergency Department (HOSPITAL_COMMUNITY): Payer: Medicare Other

## 2014-08-11 ENCOUNTER — Encounter (HOSPITAL_COMMUNITY): Payer: Self-pay | Admitting: Emergency Medicine

## 2014-08-11 ENCOUNTER — Emergency Department (HOSPITAL_COMMUNITY)
Admission: EM | Admit: 2014-08-11 | Discharge: 2014-08-12 | Disposition: A | Discharge status: Home / Self Care | Payer: Medicare Other | Attending: Emergency Medicine | Admitting: Emergency Medicine

## 2014-08-11 DIAGNOSIS — E278 Other specified disorders of adrenal gland: Secondary | ICD-10-CM | POA: Diagnosis not present

## 2014-08-11 DIAGNOSIS — Z8739 Personal history of other diseases of the musculoskeletal system and connective tissue: Secondary | ICD-10-CM | POA: Diagnosis not present

## 2014-08-11 DIAGNOSIS — J449 Chronic obstructive pulmonary disease, unspecified: Secondary | ICD-10-CM | POA: Diagnosis not present

## 2014-08-11 DIAGNOSIS — Z7952 Long term (current) use of systemic steroids: Secondary | ICD-10-CM | POA: Diagnosis not present

## 2014-08-11 DIAGNOSIS — K5730 Diverticulosis of large intestine without perforation or abscess without bleeding: Secondary | ICD-10-CM | POA: Diagnosis not present

## 2014-08-11 DIAGNOSIS — Z79899 Other long term (current) drug therapy: Secondary | ICD-10-CM | POA: Diagnosis not present

## 2014-08-11 DIAGNOSIS — E782 Mixed hyperlipidemia: Secondary | ICD-10-CM | POA: Diagnosis not present

## 2014-08-11 DIAGNOSIS — I10 Essential (primary) hypertension: Secondary | ICD-10-CM | POA: Insufficient documentation

## 2014-08-11 DIAGNOSIS — Z720 Tobacco use: Secondary | ICD-10-CM | POA: Insufficient documentation

## 2014-08-11 DIAGNOSIS — K5792 Diverticulitis of intestine, part unspecified, without perforation or abscess without bleeding: Secondary | ICD-10-CM | POA: Insufficient documentation

## 2014-08-11 DIAGNOSIS — R109 Unspecified abdominal pain: Secondary | ICD-10-CM

## 2014-08-11 DIAGNOSIS — Z85828 Personal history of other malignant neoplasm of skin: Secondary | ICD-10-CM | POA: Diagnosis not present

## 2014-08-11 DIAGNOSIS — F419 Anxiety disorder, unspecified: Secondary | ICD-10-CM | POA: Insufficient documentation

## 2014-08-11 DIAGNOSIS — Z7982 Long term (current) use of aspirin: Secondary | ICD-10-CM | POA: Diagnosis not present

## 2014-08-11 DIAGNOSIS — R110 Nausea: Secondary | ICD-10-CM | POA: Diagnosis not present

## 2014-08-11 DIAGNOSIS — R1110 Vomiting, unspecified: Secondary | ICD-10-CM | POA: Diagnosis not present

## 2014-08-11 DIAGNOSIS — K5900 Constipation, unspecified: Secondary | ICD-10-CM | POA: Diagnosis not present

## 2014-08-11 LAB — COMPREHENSIVE METABOLIC PANEL
ALT: 9 U/L (ref 0–35)
AST: 16 U/L (ref 0–37)
Albumin: 3.3 g/dL — ABNORMAL LOW (ref 3.5–5.2)
Alkaline Phosphatase: 86 U/L (ref 39–117)
Anion gap: 10 (ref 5–15)
BUN: 9 mg/dL (ref 6–23)
CO2: 31 mEq/L (ref 19–32)
Calcium: 10.4 mg/dL (ref 8.4–10.5)
Chloride: 96 mEq/L (ref 96–112)
Creatinine, Ser: 0.87 mg/dL (ref 0.50–1.10)
GFR calc Af Amer: 77 mL/min — ABNORMAL LOW (ref >90)
GFR calc non Af Amer: 66 mL/min — ABNORMAL LOW (ref >90)
Glucose, Bld: 97 mg/dL (ref 70–99)
Potassium: 3.4 mEq/L — ABNORMAL LOW (ref 3.7–5.3)
Sodium: 137 mEq/L (ref 137–147)
Total Bilirubin: 0.5 mg/dL (ref 0.3–1.2)
Total Protein: 6.7 g/dL (ref 6.0–8.3)

## 2014-08-11 LAB — CBC WITH DIFFERENTIAL/PLATELET
Basophils Absolute: 0 10*3/uL (ref 0.0–0.1)
Basophils Relative: 0 % (ref 0–1)
Eosinophils Absolute: 0.5 10*3/uL (ref 0.0–0.7)
Eosinophils Relative: 4 % (ref 0–5)
HCT: 46.8 % — ABNORMAL HIGH (ref 36.0–46.0)
Hemoglobin: 15.8 g/dL — ABNORMAL HIGH (ref 12.0–15.0)
Lymphocytes Relative: 29 % (ref 12–46)
Lymphs Abs: 3.4 10*3/uL (ref 0.7–4.0)
MCH: 32.5 pg (ref 26.0–34.0)
MCHC: 33.8 g/dL (ref 30.0–36.0)
MCV: 96.3 fL (ref 78.0–100.0)
Monocytes Absolute: 0.9 10*3/uL (ref 0.1–1.0)
Monocytes Relative: 7 % (ref 3–12)
Neutro Abs: 7 10*3/uL (ref 1.7–7.7)
Neutrophils Relative %: 60 % (ref 43–77)
Platelets: 185 10*3/uL (ref 150–400)
RBC: 4.86 MIL/uL (ref 3.87–5.11)
RDW: 14.1 % (ref 11.5–15.5)
WBC: 11.8 10*3/uL — ABNORMAL HIGH (ref 4.0–10.5)

## 2014-08-11 IMAGING — CR DG ABDOMEN ACUTE W/ 1V CHEST
3 series · 3 of 3 positions shown · non-contrast
Comparison: Chest radiograph [DATE]

CLINICAL DATA: Lower and mid abdominal pain for 2 weeks, now
worsening. Nausea. History of thyroid surgery 2 weeks ago.

EXAM:
ACUTE ABDOMEN SERIES (ABDOMEN 2 VIEW & CHEST 1 VIEW)

[w chest pa]
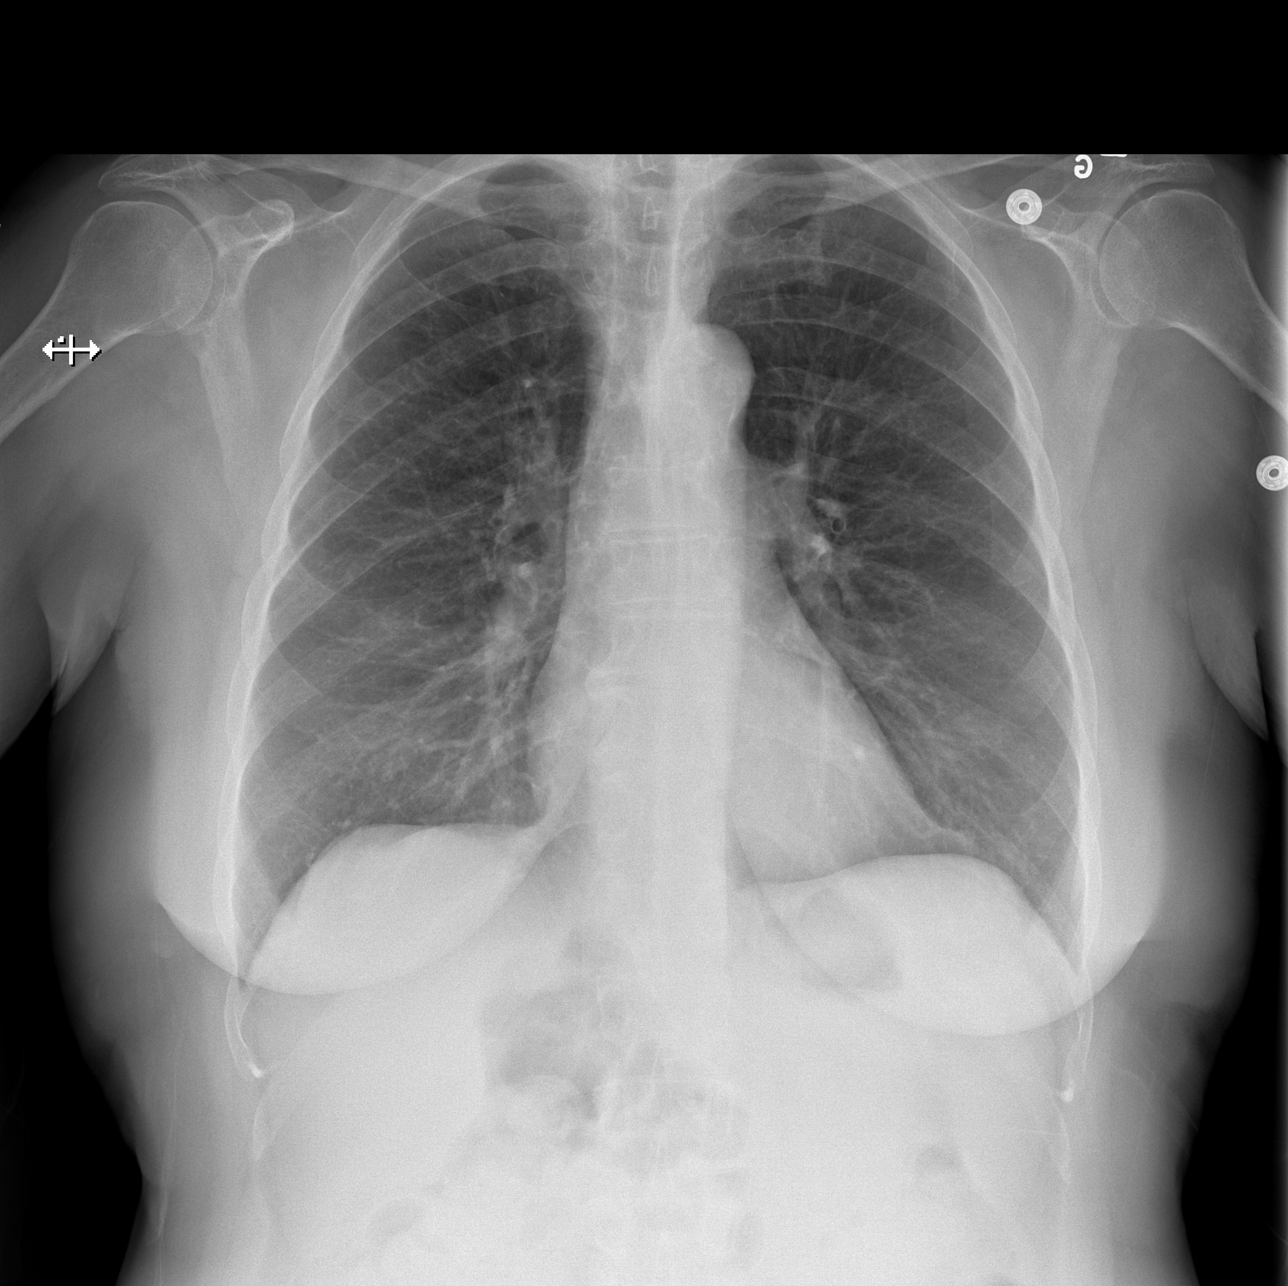

[w abdomen upright]
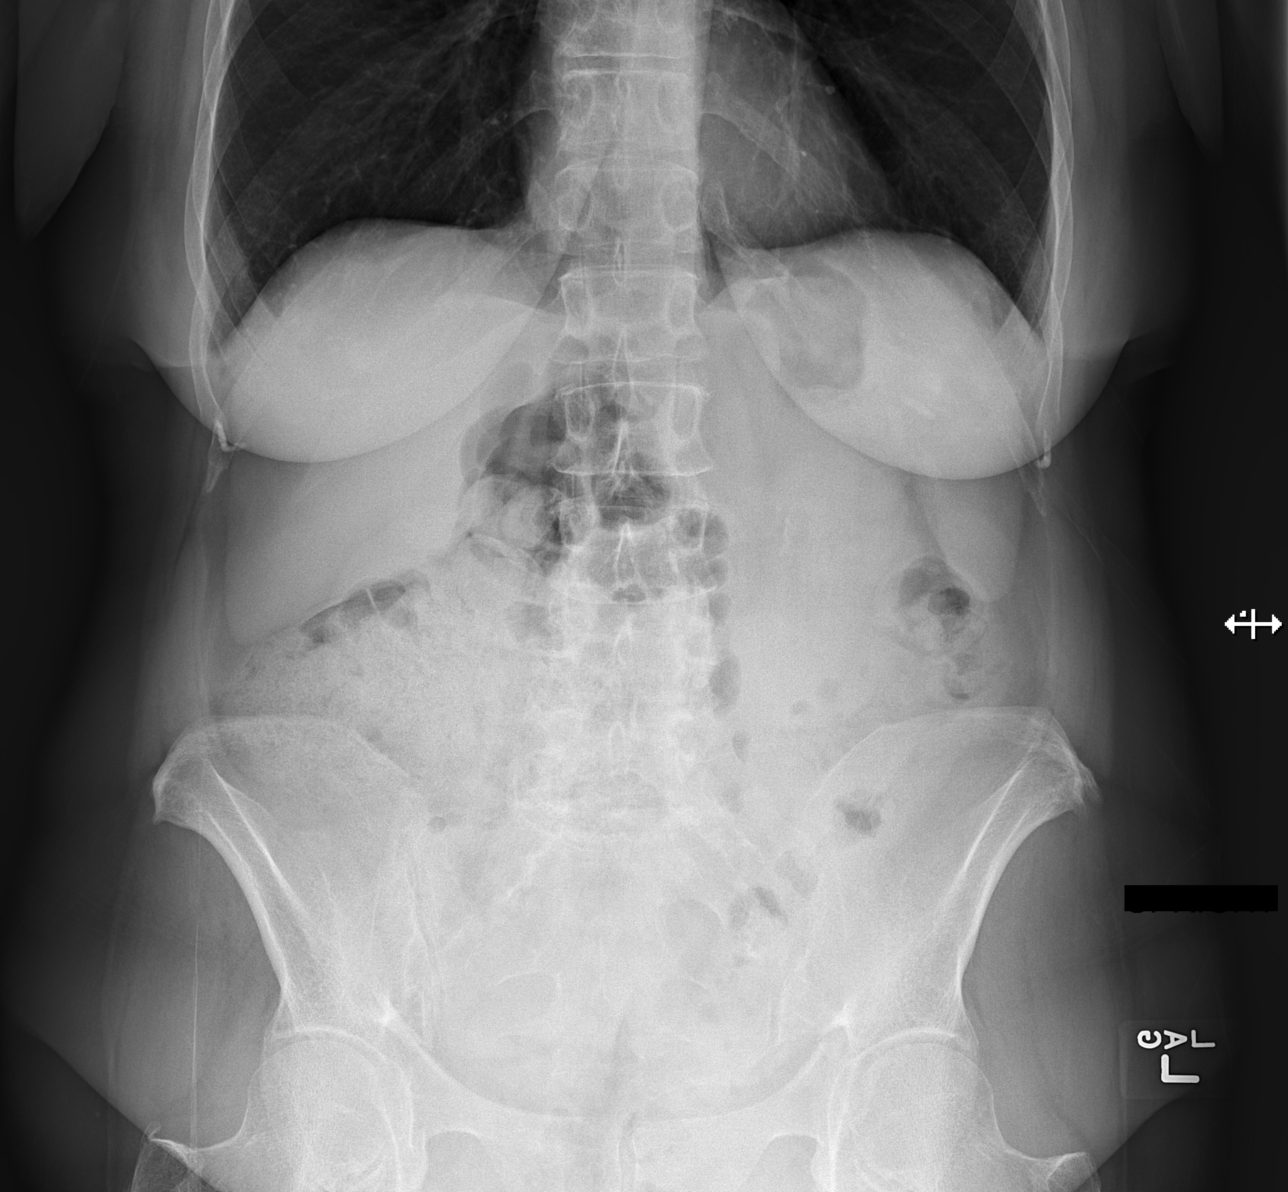

[t abdomen supine]
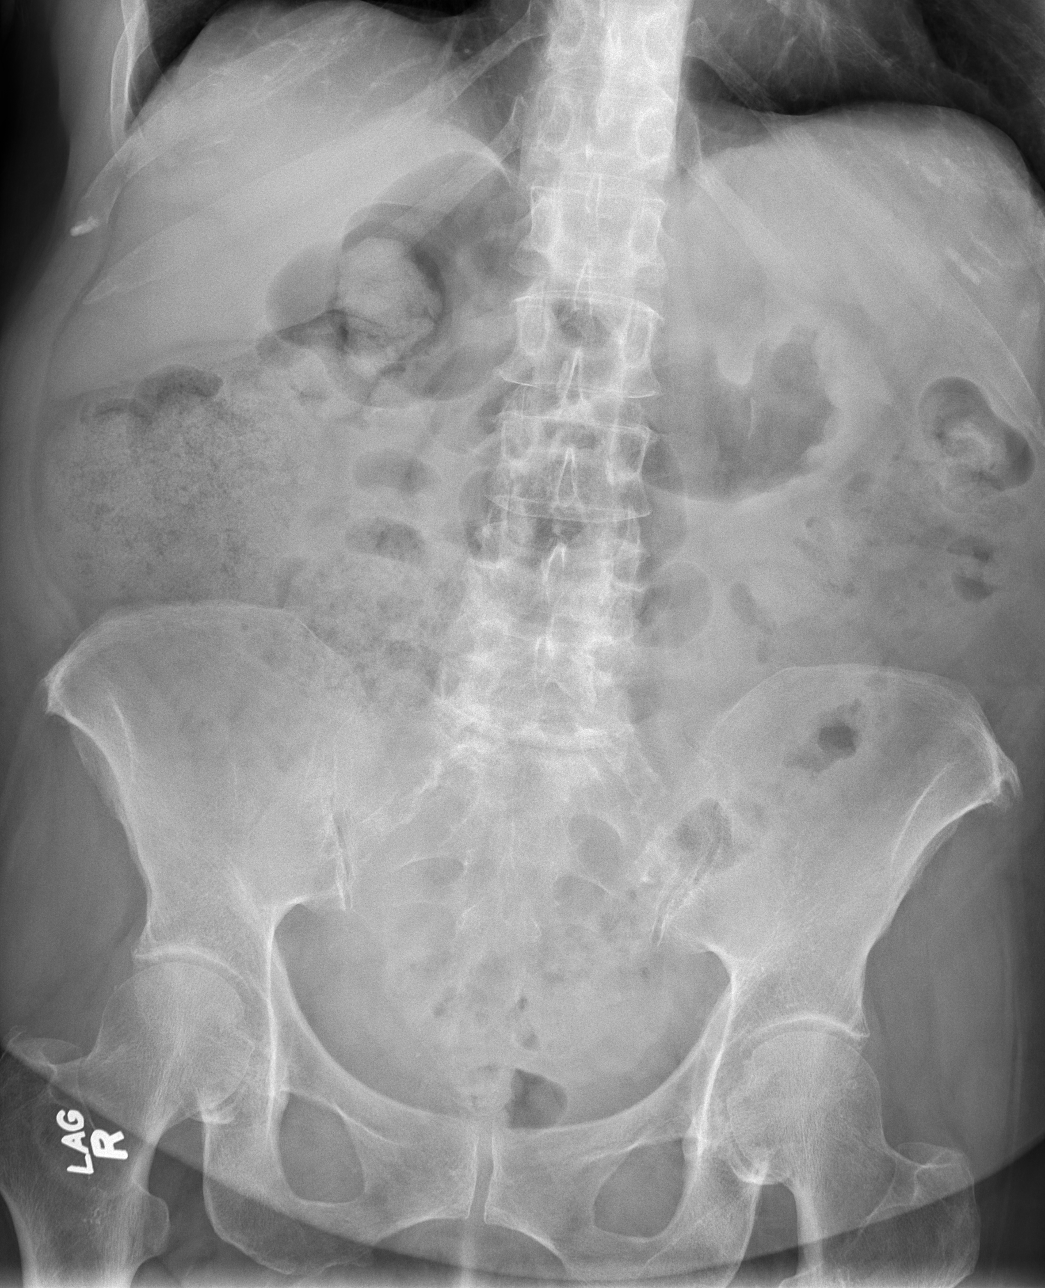

[3 of 3 positions shown; findings below may reference images not displayed]

FINDINGS: Cardiac silhouette appears normal, unchanged. Mildly calcified
aortic knob. Lungs are clear, no pleural effusions. No pneumothorax.
Soft tissue planes and included osseous structures are non acute;
surgical clips in the neck consistent with history of thyroid
surgery.

Bowel gas pattern is nondilated and nonobstructive. At least
moderate amount of retained large bowel stool. No intra-abdominal
mass effect, pathologic calcifications or free air. Moderate
vascular calcifications. Soft tissue planes and included osseous
structures are nonsuspicious.
IMPRESSION: No acute cardiopulmonary process.

Nonobstructive bowel gas pattern with at least moderate amount of
retained large bowel stool.

  By: AMAZIGH

## 2014-08-11 NOTE — ED Notes (Signed)
Pt states that she had surgery on 10/15; pt reports small BM last week and has been unable to have BM since; pt states that she has taken laxatives and colace without results; pt reports some bright red blood when she wipes over the last few days; pt states that she saw her PCP and he advised that she had a tear from straining; pt also c/o N/V over the last couple of days; pt states that she called her PCP and he wanted her to come in tomorrow for eval for blockage; pt was concerned and wants to be evaluated tonight

## 2014-08-11 NOTE — ED Notes (Signed)
Patient reports she had thyroid surgery on 10/15. She had chronic constipation but has one bowel movement since surgery with a small amount of bright red blood.

## 2014-08-11 NOTE — ED Provider Notes (Signed)
CSN: [id number]     Arrival date & time 08/11/14  1954 History   First MD Initiated Contact with Patient 08/11/14 2200     Chief Complaint  Patient presents with  . Constipation     (Consider location/radiation/quality/duration/timing/severity/associated sxs/prior Treatment) HPI Comments: Patient presents to the ED with a chief complaint of constipation and abdominal pain. She states that she has been constipated for the past 2 weeks. Her last few days, she has noticed bright red blood when wiping, as well as some vomiting. She states that she is unable to keep food down. She states that her abdominal pain is moderate. There are no aggravating or alleviating factors. She has tried taking Colace and another laxative without results.  The history is provided by the patient. No language interpreter was used.    Past Medical History  Diagnosis Date  . Urinary incontinence   . Anxiety   . Cervicalgia   . Hypertension   . Hypercalcemia   . Mixed hyperlipidemia   . Basal cell carcinoma   . COPD (chronic obstructive pulmonary disease)   . Asthma   . 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 Bruises easily   . Frequency of urination   . Thyroid neoplasm    Past Surgical History  Procedure Laterality Date  . Vaginal hysterectomy    . Foot surgery  30 YRS AGO    BILATERAL  . Dental surgery    . Skin cancer excision    . Thyroidectomy N/A 07/31/2014    Procedure: TOTAL THYROIDECTOMY;  Surgeon: Armandina Gemma, MD;  Location: [facility];  Service: General;  Laterality: N/A;   Family History  Problem Relation Age of Onset  . Coronary artery disease Father   . Hypertension Father   . Asthma Father   . Allergies Father   . Hypertension Mother   . Lung cancer Mother    History  Substance Use Topics  . Smoking status: Current Every Day Smoker -- 1.00 packs/day for 20 years    Types: Cigarettes  . Smokeless tobacco: Not on file  . Alcohol Use: No   OB History   Grav Para Term  Preterm Abortions TAB SAB Ect Mult Living                 Review of Systems  Constitutional: Negative for fever and chills.  Respiratory: Negative for shortness of breath.   Cardiovascular: Negative for chest pain.  Gastrointestinal: Positive for vomiting and constipation. Negative for nausea and diarrhea.  Genitourinary: Negative for dysuria.  All other systems reviewed and are negative.     Allergies  Codeine  Home Medications   Prior to Admission medications   Medication Sig Start Date End Date Taking? Authorizing Provider  albuterol (PROVENTIL HFA;VENTOLIN HFA) 108 (90 BASE) MCG/ACT inhaler Inhale 2 puffs into the lungs every 6 (six) hours as needed for wheezing or shortness of breath.    Yes Historical Provider, MD  aspirin 81 MG tablet Take 81 mg by mouth every morning.    Yes Historical Provider, MD  benazepril-hydrochlorthiazide (LOTENSIN HCT) 20-12.5 MG per tablet Take 1 tablet by mouth every morning.   Yes Historical Provider, MD  bisoprolol (ZEBETA) 10 MG tablet Take 10 mg by mouth every morning.    Yes Historical Provider, MD  budesonide-formoterol (SYMBICORT) 160-4.5 MCG/ACT inhaler Inhale 2 puffs into the lungs 2 (two) times daily.     Yes Historical Provider, MD  buPROPion (WELLBUTRIN XL) 300  MG 24 hr tablet Take 300 mg by mouth every morning.   Yes Historical Provider, MD  busPIRone (BUSPAR) 30 MG tablet Take 30 mg by mouth 2 (two) times daily.    Yes Historical Provider, MD  calcium-vitamin D (OSCAL WITH D) 500-200 MG-UNIT per tablet Take 2 tablets by mouth 2 (two) times daily. 08/01/14  Yes Armandina Gemma, MD  Cholecalciferol (VITAMIN D-3 PO) Take 1 tablet by mouth every morning.   Yes Historical Provider, MD  Cyanocobalamin (VITAMIN B 12 PO) Take 1 tablet by mouth every morning.   Yes Historical Provider, MD  diphenhydramine-acetaminophen (TYLENOL PM) 25-500 MG TABS Take 1 tablet by mouth at bedtime as needed (sleep).   Yes Historical Provider, MD  losartan (COZAAR)  50 MG tablet Take 50 mg by mouth every morning.   Yes Historical Provider, MD  pravastatin (PRAVACHOL) 40 MG tablet Take 40 mg by mouth at bedtime.    Yes Historical Provider, MD  SYNTHROID 75 MCG tablet Take 1 tablet (75 mcg total) by mouth daily before breakfast. 08/01/14  Yes Armandina Gemma, MD  tiotropium (SPIRIVA) 18 MCG inhalation capsule Place 18 mcg into inhaler and inhale daily.    Yes Historical Provider, MD  zolpidem (AMBIEN) 10 MG tablet Take 5 mg by mouth at bedtime as needed for sleep.    Yes Historical Provider, MD   BP 149/67  Pulse 65  Temp(Src) 98.9 F (37.2 C) (Oral)  Resp 20  Ht 5' (1.524 m)  Wt 138 lb (62.596 kg)  BMI 26.95 kg/m2  SpO2 91% Physical Exam  Nursing note and vitals reviewed. Constitutional: She is oriented to person, place, and time. She appears well-developed and well-nourished.  HENT:  Head: Normocephalic and atraumatic.  Eyes: Conjunctivae and EOM are normal. Pupils are equal, round, and reactive to light.  Neck: Normal range of motion. Neck supple.  Cardiovascular: Normal rate and regular rhythm.  Exam reveals no gallop and no friction rub.   No murmur heard. Pulmonary/Chest: Effort normal and breath sounds normal. No respiratory distress. She has no wheezes. She has no rales. She exhibits no tenderness.  Abdominal: Soft. Bowel sounds are normal. She exhibits no distension and no mass. There is no tenderness. There is no rebound and no guarding.  Genitourinary:  Chaperone present for rectal exam, melanotic stool on exam  Musculoskeletal: Normal range of motion. She exhibits no edema and no tenderness.  Neurological: She is alert and oriented to person, place, and time.  Skin: Skin is warm and dry.  Psychiatric: She has a normal mood and affect. Her behavior is normal. Judgment and thought content normal.    ED Course  Procedures (including critical care time) Results for orders placed during the hospital encounter of 08/11/14  CBC WITH  DIFFERENTIAL      Result Value Ref Range   WBC 11.8 (*) 4.0 - 10.5 K/uL   RBC 4.86  3.87 - 5.11 MIL/uL   Hemoglobin 15.8 (*) 12.0 - 15.0 g/dL   HCT 46.8 (*) 36.0 - 46.0 %   MCV 96.3  78.0 - 100.0 fL   MCH 32.5  26.0 - 34.0 pg   MCHC 33.8  30.0 - 36.0 g/dL   RDW 14.1  11.5 - 15.5 %   Platelets 185  150 - 400 K/uL   Neutrophils Relative % 60  43 - 77 %   Neutro Abs 7.0  1.7 - 7.7 K/uL   Lymphocytes Relative 29  12 - 46 %   Lymphs Abs  3.4  0.7 - 4.0 K/uL   Monocytes Relative 7  3 - 12 %   Monocytes Absolute 0.9  0.1 - 1.0 K/uL   Eosinophils Relative 4  0 - 5 %   Eosinophils Absolute 0.5  0.0 - 0.7 K/uL   Basophils Relative 0  0 - 1 %   Basophils Absolute 0.0  0.0 - 0.1 K/uL  COMPREHENSIVE METABOLIC PANEL      Result Value Ref Range   Sodium 137  137 - 147 mEq/L   Potassium 3.4 (*) 3.7 - 5.3 mEq/L   Chloride 96  96 - 112 mEq/L   CO2 31  19 - 32 mEq/L   Glucose, Bld 97  70 - 99 mg/dL   BUN 9  6 - 23 mg/dL   Creatinine, Ser 0.87  0.50 - 1.10 mg/dL   Calcium 10.4  8.4 - 10.5 mg/dL   Total Protein 6.7  6.0 - 8.3 g/dL   Albumin 3.3 (*) 3.5 - 5.2 g/dL   AST 16  0 - 37 U/L   ALT 9  0 - 35 U/L   Alkaline Phosphatase 86  39 - 117 U/L   Total Bilirubin 0.5  0.3 - 1.2 mg/dL   GFR calc non Af Amer 66 (*) >90 mL/min   GFR calc Af Amer 77 (*) >90 mL/min   Anion gap 10  5 - 15   Dg Chest 2 View  07/29/2014   CLINICAL DATA:  Preop for thyroid surgery, some shortness of breath, smoking history  EXAM: CHEST  2 VIEW  COMPARISON:  None.  FINDINGS: No active infiltrate or effusion is seen. Mediastinal and hilar contours are unremarkable. The heart is within normal limits in size. No bony abnormality is seen.  IMPRESSION: No active cardiopulmonary disease.   Electronically Signed   By: Ivar Drape M.D.   On: 07/29/2014 13:04   Dg Abd Acute W/chest  08/11/2014   CLINICAL DATA:  Lower and mid abdominal pain for 2 weeks, now worsening. Nausea. History of thyroid surgery 2 weeks ago.  EXAM: ACUTE  ABDOMEN SERIES (ABDOMEN 2 VIEW & CHEST 1 VIEW)  COMPARISON:  Chest radiograph July 29, 2014  FINDINGS: Cardiac silhouette appears normal, unchanged. Mildly calcified aortic knob. Lungs are clear, no pleural effusions. No pneumothorax. Soft tissue planes and included osseous structures are non acute; surgical clips in the neck consistent with history of thyroid surgery.  Bowel gas pattern is nondilated and nonobstructive. At least moderate amount of retained large bowel stool. No intra-abdominal mass effect, pathologic calcifications or free air. Moderate vascular calcifications. Soft tissue planes and included osseous structures are nonsuspicious.  IMPRESSION: No acute cardiopulmonary process.  Nonobstructive bowel gas pattern with at least moderate amount of retained large bowel stool.   Electronically Signed   By: Elon Alas   On: 08/11/2014 23:19      EKG Interpretation None      MDM   Final diagnoses:  Abdominal pain   Patient with abdominal pain and constipation.  VSS.  Labs are reassuring.    Rectal exam unremarkable for large stool ball, melanotic stool.  H/H is stable.  Will check plain films.  Plain films negative, will get CT abd.  Patient signed out to [place], [place].   Plan: Follow-up on CT.  If negative, DC to home with Fleet enema and PCP follow-up tomorrow.    Montine Circle, PA-C 08/12/14 [phone]

## 2014-08-12 ENCOUNTER — Emergency Department (HOSPITAL_COMMUNITY): Payer: Medicare Other

## 2014-08-12 ENCOUNTER — Encounter (HOSPITAL_COMMUNITY): Payer: Self-pay

## 2014-08-12 DIAGNOSIS — K5792 Diverticulitis of intestine, part unspecified, without perforation or abscess without bleeding: Secondary | ICD-10-CM | POA: Diagnosis not present

## 2014-08-12 DIAGNOSIS — E278 Other specified disorders of adrenal gland: Secondary | ICD-10-CM | POA: Diagnosis not present

## 2014-08-12 DIAGNOSIS — K5730 Diverticulosis of large intestine without perforation or abscess without bleeding: Secondary | ICD-10-CM | POA: Diagnosis not present

## 2014-08-12 IMAGING — CT CT ABD-PELV W/ CM
1 of 3 series · 14 of 32 positions shown, 19 images · IV contrast (100 ML OMNI 300)
Comparison: [DATE] radiograph

CLINICAL DATA: Nausea, vomiting.  Constipation.

EXAM:
CT ABDOMEN AND PELVIS WITH CONTRAST
TECHNIQUE: Multidetector CT imaging of the abdomen and pelvis was performed
using the standard protocol following bolus administration of
intravenous contrast.
CONTRAST:  100mL OMNIPAQUE IOHEXOL 300 MG/ML  SOLN

[Series 2: abd/pel with · axial · 0.73mm/px · z∈[+1041,+1366]mm · 14 of 73 slices shown, 19 images]
[im 4/73  soft-tissue]
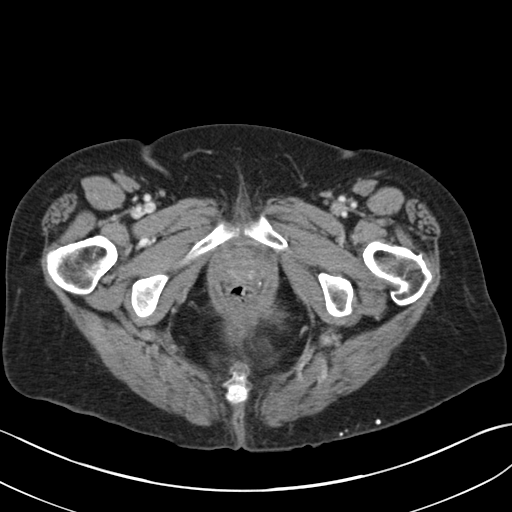
[im 4/73  bone]
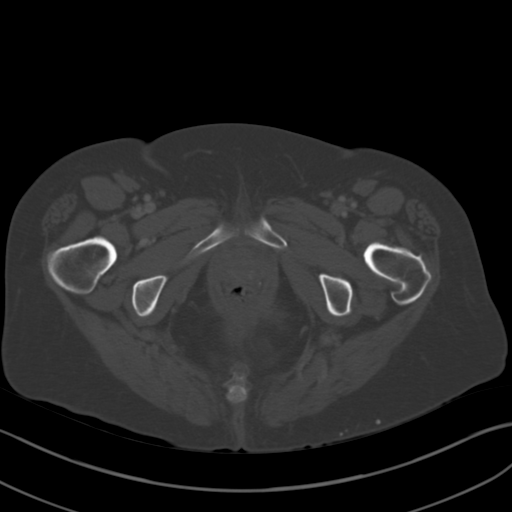
[im 11/73  soft-tissue]
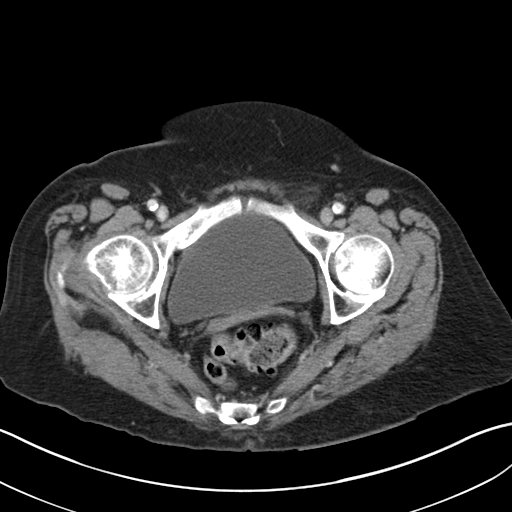
[im 15/73  soft-tissue]
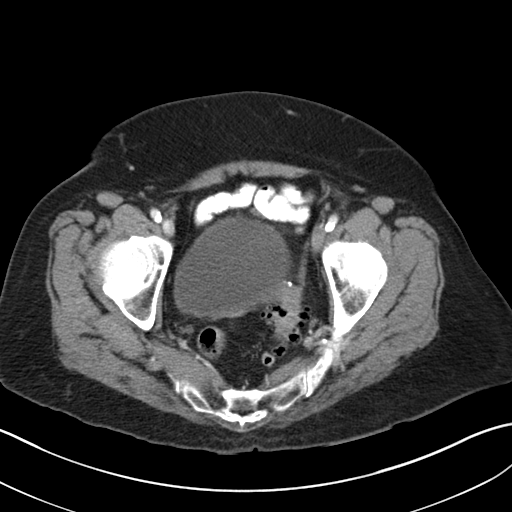
[im 22/73  soft-tissue]
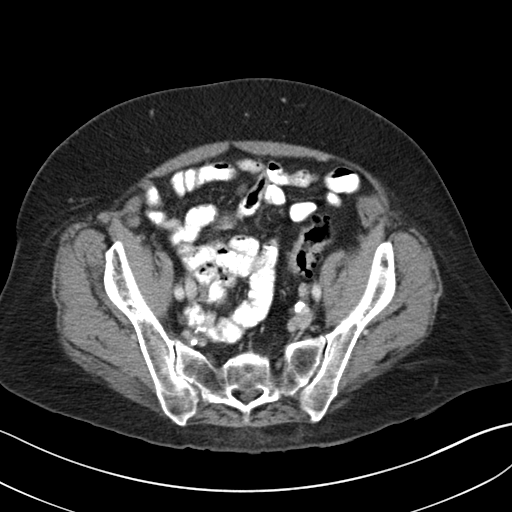
[im 26/73  soft-tissue]
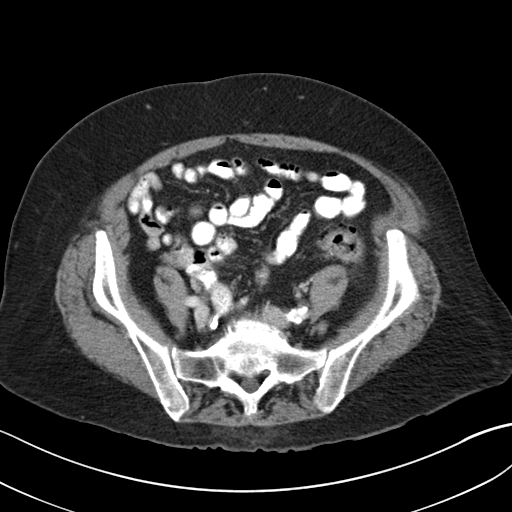
[im 33/73  soft-tissue]
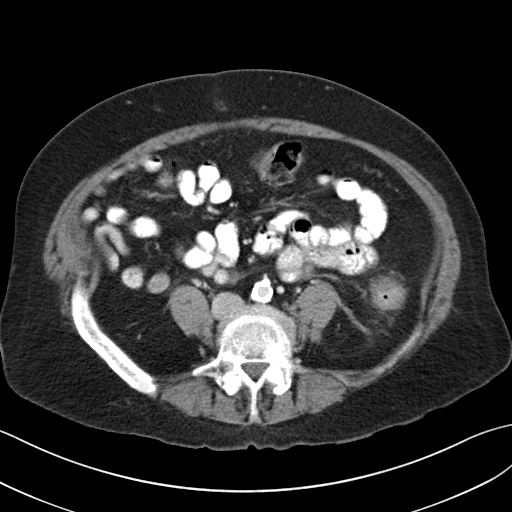
[im 37/73  soft-tissue]
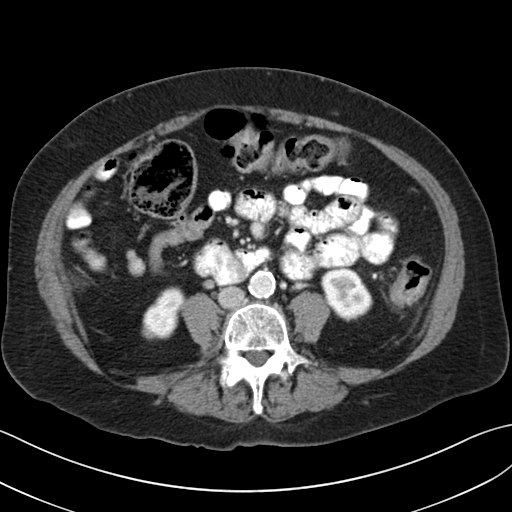
[im 40/73  soft-tissue]
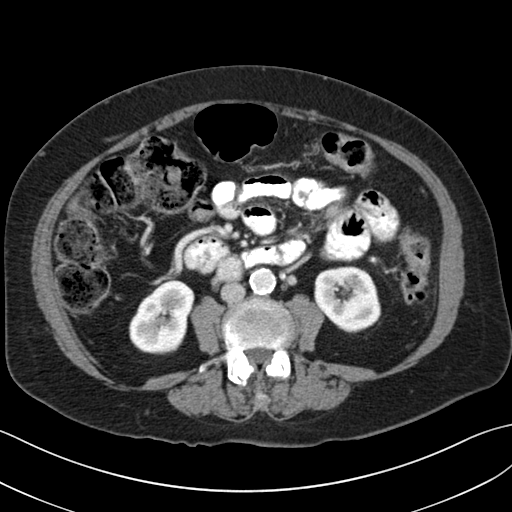
[im 47/73  soft-tissue]
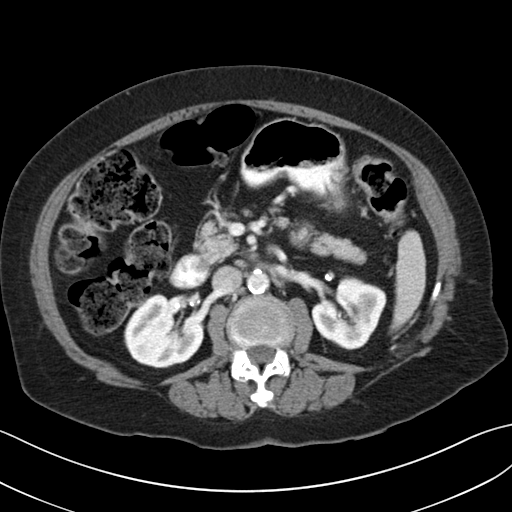
[im 47/73  bone]
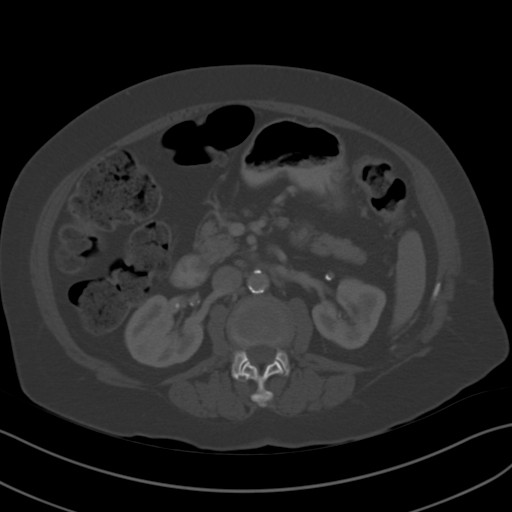
[im 51/73  soft-tissue]
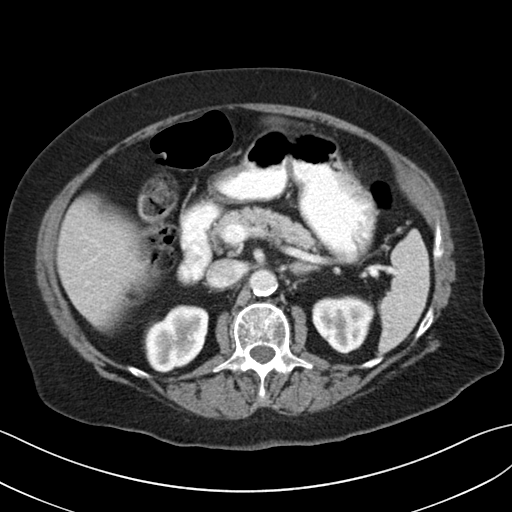
[im 58/73  soft-tissue]
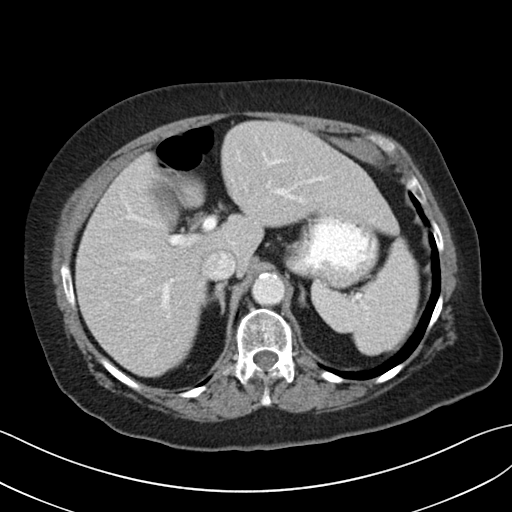
[im 58/73  lung]
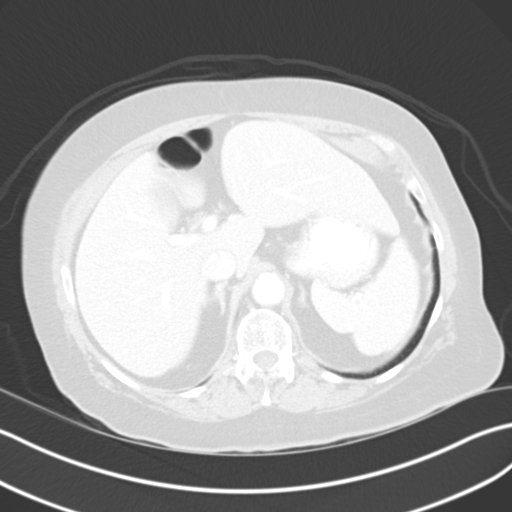
[im 62/73  soft-tissue]
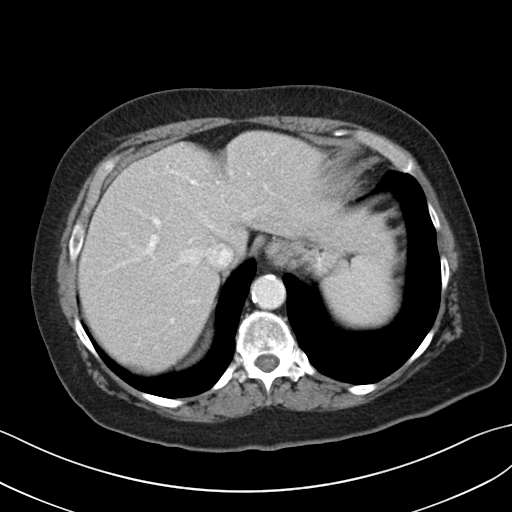
[im 62/73  lung]
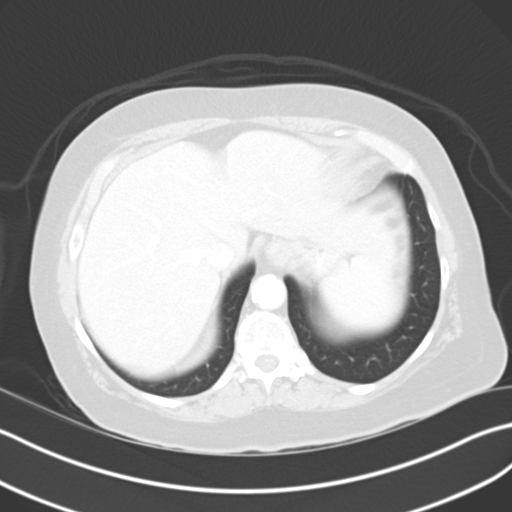
[im 65/73  lung]
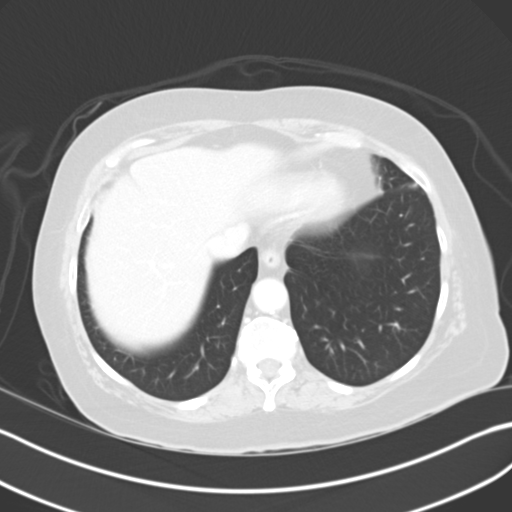
[im 69/73  soft-tissue]
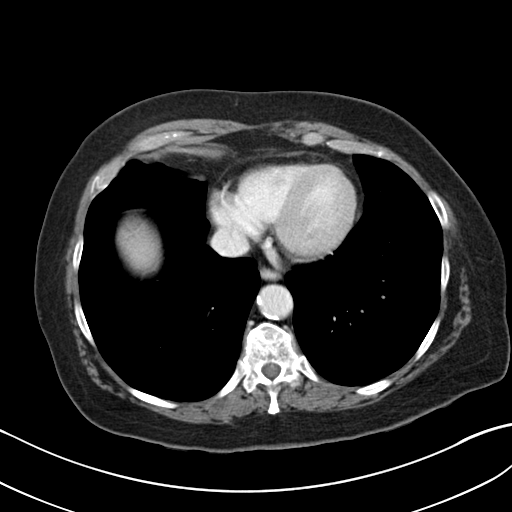
[im 69/73  lung]
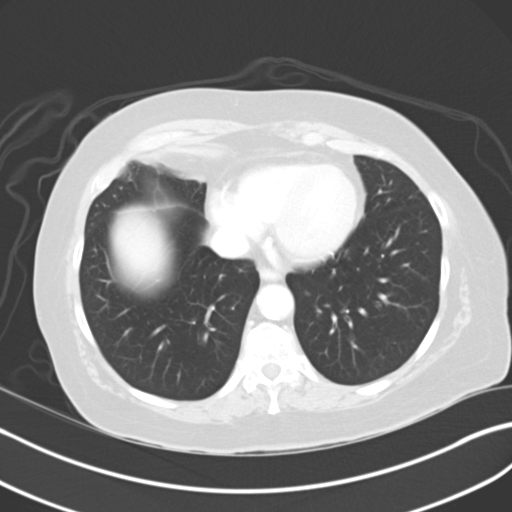

[14 of 32 positions shown; findings below may reference images not displayed]

FINDINGS: Lung bases clear. Normal heart size. Coronary artery calcifications.

No appreciable abnormality of the liver, spleen, biliary system,
pancreas, right adrenal gland.

1.7 cm left adrenal nodule is indeterminate.

Lobular renal contours. Symmetric renal enhancement. No
hydroureteronephrosis.

Colonic diverticulosis. Sigmoid of distal descending/ proximal
sigmoid colon with pericolonic fat stranding. No free
intraperitoneal air or loculated fluid collection. No
lymphadenopathy. Appendix not confidently identified. No right lower
quadrant inflammation. Small bowel loops are of normal course and
caliber.

Scattered atherosclerotic disease of the aorta and branch vessels
without aneurysmal dilatation.

Thin walled bladder.  Absent uterus.  No adnexal mass.

Multilevel degenerative changes. No acute osseous finding. Grade 1
anterolisthesis of L4 on L5 and retrolisthesis of L5 on S1.
Degenerative disc disease at L5-S1.
IMPRESSION: Colonic diverticulosis. Mild distal descending/ proximal sigmoid
colon fat stranding suggests acute or sequelae of recent
diverticulitis. Recommend colonoscopy follow-up to exclude an
underlying mass.

1.7 cm left adrenal nodule is indeterminate. Recommend adrenal
protocol MRI or CT.

## 2014-08-12 MED ORDER — ONDANSETRON 4 MG PO TBDP: 4.0000 mg | ORAL_TABLET | Freq: Three times a day (TID) | ORAL | Status: DC | PRN

## 2014-08-12 MED ORDER — CIPROFLOXACIN HCL 500 MG PO TABS: 500.0000 mg | ORAL_TABLET | Freq: Two times a day (BID) | ORAL | Status: DC

## 2014-08-12 MED ORDER — CIPROFLOXACIN HCL 500 MG PO TABS
500.0000 mg | ORAL_TABLET | Freq: Once | ORAL | Status: AC
  Administered 2014-08-12: 500 mg via ORAL
  Filled 2014-08-12: qty 1

## 2014-08-12 MED ORDER — IOHEXOL 300 MG/ML  SOLN
50.0000 mL | Freq: Once | INTRAMUSCULAR | Status: AC | PRN
  Administered 2014-08-12: 50 mL via ORAL

## 2014-08-12 MED ORDER — IOHEXOL 300 MG/ML  SOLN
100.0000 mL | Freq: Once | INTRAMUSCULAR | Status: AC | PRN
  Administered 2014-08-12: 100 mL via INTRAVENOUS

## 2014-08-12 MED ORDER — ONDANSETRON HCL 4 MG/2ML IJ SOLN
4.0000 mg | Freq: Once | INTRAMUSCULAR | Status: AC
  Administered 2014-08-12: 4 mg via INTRAVENOUS
  Filled 2014-08-12: qty 2

## 2014-08-12 MED ORDER — METRONIDAZOLE 500 MG PO TABS: 500.0000 mg | ORAL_TABLET | Freq: Three times a day (TID) | ORAL | Status: DC

## 2014-08-12 MED ORDER — METRONIDAZOLE 500 MG PO TABS
500.0000 mg | ORAL_TABLET | Freq: Once | ORAL | Status: AC
  Administered 2014-08-12: 500 mg via ORAL
  Filled 2014-08-12: qty 1

## 2014-08-12 MED ORDER — HYDROCODONE-ACETAMINOPHEN 5-325 MG PO TABS: ORAL_TABLET | ORAL | Status: DC

## 2014-08-12 NOTE — ED Provider Notes (Signed)
Medical screening examination/treatment/procedure(s) were performed by non-physician practitioner and as supervising physician I was immediately available for consultation/collaboration.   EKG Interpretation None        Wynetta Fines, MD 08/12/14 [phone]

## 2014-08-12 NOTE — Discharge Instructions (Signed)
Please read and follow all provided instructions.  Your diagnoses today include:  1. Acute diverticulitis   2. Abdominal pain     Tests performed today include:  CT scan showing acute diverticulitis  Blood counts and electrolytes  Blood tests to check liver and kidney function  Blood tests to check pancreas function  Urine test to look for infection  Vital signs. See below for your results today.   Medications prescribed:   Ciprofloxacin - antibiotic  You have been prescribed an antibiotic medicine: take the entire course of medicine even if you are feeling better. Stopping early can cause the antibiotic not to work.   Metronidazole - antibiotic  You have been prescribed an antibiotic medicine: take the entire course of medicine even if you are feeling better. Stopping early can cause the antibiotic not to work. Do not drink alcohol when taking this medication.    Vicodin (hydrocodone/acetaminophen) - narcotic pain medication  DO NOT drive or perform any activities that require you to be awake and alert because this medicine can make you drowsy. BE VERY CAREFUL not to take multiple medicines containing Tylenol (also called acetaminophen). Doing so can lead to an overdose which can damage your liver and cause liver failure and possibly death.   Zofran (ondansetron) - for nausea and vomiting  Take any prescribed medications only as directed.  Home care instructions:   Follow any educational materials contained in this packet.  Follow-up instructions: Please follow-up with your primary care provider in the next 2 days for further evaluation of your symptoms.    Return instructions:  SEEK IMMEDIATE MEDICAL ATTENTION IF:  The pain does not go away or becomes severe   A temperature above 101F develops   Repeated vomiting occurs (multiple episodes)   The pain becomes localized to portions of the abdomen. The right side could possibly be appendicitis. In an adult,  the left lower portion of the abdomen could be colitis or diverticulitis.   Blood is being passed in stools or vomit (bright red or black tarry stools)   You develop chest pain, difficulty breathing, dizziness or fainting, or become confused, poorly responsive, or inconsolable (young children)  If you have any other emergent concerns regarding your health  Additional Information: Abdominal (belly) pain can be caused by many things. Your caregiver performed an examination and possibly ordered blood/urine tests and imaging (CT scan, x-rays, ultrasound). Many cases can be observed and treated at home after initial evaluation in the emergency department. Even though you are being discharged home, abdominal pain can be unpredictable. Therefore, you need a repeated exam if your pain does not resolve, returns, or worsens. Most patients with abdominal pain don't have to be admitted to the hospital or have surgery, but serious problems like appendicitis and gallbladder attacks can start out as nonspecific pain. Many abdominal conditions cannot be diagnosed in one visit, so follow-up evaluations are very important.  Your vital signs today were: BP 135/61   Pulse 63   Temp(Src) 97.8 F (36.6 C) (Oral)   Resp 18   Ht 5' (1.524 m)   Wt 138 lb (62.596 kg)   BMI 26.95 kg/m2   SpO2 91% If your blood pressure (bp) was elevated above 135/85 this visit, please have this repeated by your doctor within one month. --------------

## 2014-08-12 NOTE — ED Provider Notes (Signed)
1:56 AM Handoff from [facility] at shift change.   Patient pending CT for periumbilical abdominal pain, constipation, blood in stool. CT demonstrates acute diverticulitis without perforation or abscess. Patient informed of findings. She has PCP follow-up. First is of a oral antibiotics given in the emergency department. Will also give medication for pain and nausea.  Patient to follow-up with her primary care physician to monitor treatment progress in for recheck.  The patient was urged to return to the Emergency Department immediately with worsening of current symptoms, worsening abdominal pain, persistent vomiting, worsening amount of blood noted in stools, fever, or any other concerns. The patient verbalized understanding.   Patient counseled on use of narcotic pain medications. Counseled not to combine these medications with others containing tylenol. Urged not to drink alcohol, drive, or perform any other activities that requires focus while taking these medications. The patient verbalizes understanding and agrees with the plan.   Carlisle Cater, PA-C 08/12/14 0157

## 2014-08-19 ENCOUNTER — Other Ambulatory Visit (INDEPENDENT_AMBULATORY_CARE_PROVIDER_SITE_OTHER): Payer: Self-pay | Admitting: Surgery

## 2014-08-19 ENCOUNTER — Other Ambulatory Visit (INDEPENDENT_AMBULATORY_CARE_PROVIDER_SITE_OTHER): Payer: Self-pay

## 2014-08-19 ENCOUNTER — Other Ambulatory Visit: Payer: Self-pay | Admitting: Family Medicine

## 2014-08-19 DIAGNOSIS — E279 Disorder of adrenal gland, unspecified: Principal | ICD-10-CM

## 2014-08-19 DIAGNOSIS — E042 Nontoxic multinodular goiter: Secondary | ICD-10-CM

## 2014-08-19 DIAGNOSIS — E278 Other specified disorders of adrenal gland: Secondary | ICD-10-CM

## 2014-09-02 DIAGNOSIS — D72829 Elevated white blood cell count, unspecified: Secondary | ICD-10-CM | POA: Diagnosis not present

## 2014-09-02 DIAGNOSIS — N289 Disorder of kidney and ureter, unspecified: Secondary | ICD-10-CM | POA: Diagnosis not present

## 2014-09-02 DIAGNOSIS — E890 Postprocedural hypothyroidism: Secondary | ICD-10-CM | POA: Diagnosis not present

## 2014-09-17 ENCOUNTER — Other Ambulatory Visit: Payer: Medicare Other

## 2014-09-20 DIAGNOSIS — J209 Acute bronchitis, unspecified: Secondary | ICD-10-CM | POA: Diagnosis not present

## 2014-09-20 DIAGNOSIS — J019 Acute sinusitis, unspecified: Secondary | ICD-10-CM | POA: Diagnosis not present

## 2014-09-26 DIAGNOSIS — G4700 Insomnia, unspecified: Secondary | ICD-10-CM | POA: Diagnosis not present

## 2014-09-26 DIAGNOSIS — F17200 Nicotine dependence, unspecified, uncomplicated: Secondary | ICD-10-CM | POA: Diagnosis not present

## 2014-09-26 DIAGNOSIS — E785 Hyperlipidemia, unspecified: Secondary | ICD-10-CM | POA: Diagnosis not present

## 2014-09-26 DIAGNOSIS — E890 Postprocedural hypothyroidism: Secondary | ICD-10-CM | POA: Diagnosis not present

## 2014-09-26 DIAGNOSIS — I10 Essential (primary) hypertension: Secondary | ICD-10-CM | POA: Diagnosis not present

## 2014-09-26 DIAGNOSIS — J449 Chronic obstructive pulmonary disease, unspecified: Secondary | ICD-10-CM | POA: Diagnosis not present

## 2014-10-13 ENCOUNTER — Ambulatory Visit (INDEPENDENT_AMBULATORY_CARE_PROVIDER_SITE_OTHER)
Admission: RE | Admit: 2014-10-13 | Discharge: 2014-10-13 | Disposition: A | Discharge status: Home / Self Care | Payer: Medicare Other | Source: Ambulatory Visit | Attending: Internal Medicine | Admitting: Internal Medicine

## 2014-10-13 ENCOUNTER — Ambulatory Visit (INDEPENDENT_AMBULATORY_CARE_PROVIDER_SITE_OTHER): Payer: Medicare Other | Admitting: Internal Medicine

## 2014-10-13 ENCOUNTER — Telehealth: Payer: Self-pay | Admitting: Pulmonary Disease

## 2014-10-13 ENCOUNTER — Telehealth: Payer: Self-pay | Admitting: Internal Medicine

## 2014-10-13 ENCOUNTER — Encounter: Payer: Self-pay | Admitting: Internal Medicine

## 2014-10-13 VITALS — BP 124/66 | HR 71 | Ht 60.0 in | Wt 144.0 lb

## 2014-10-13 DIAGNOSIS — J432 Centrilobular emphysema: Secondary | ICD-10-CM

## 2014-10-13 DIAGNOSIS — I10 Essential (primary) hypertension: Secondary | ICD-10-CM

## 2014-10-13 DIAGNOSIS — F17210 Nicotine dependence, cigarettes, uncomplicated: Secondary | ICD-10-CM

## 2014-10-13 DIAGNOSIS — J40 Bronchitis, not specified as acute or chronic: Secondary | ICD-10-CM | POA: Diagnosis not present

## 2014-10-13 IMAGING — CR DG CHEST 2V
2 series · 2 of 2 positions shown · non-contrast
Comparison: [DATE]

CLINICAL DATA: Cough, congestion for 1 month. Shortness of breath.
History of COPD, smoker, hypertension.

EXAM:
CHEST  2 VIEW

[view not recorded (1 of 2)]
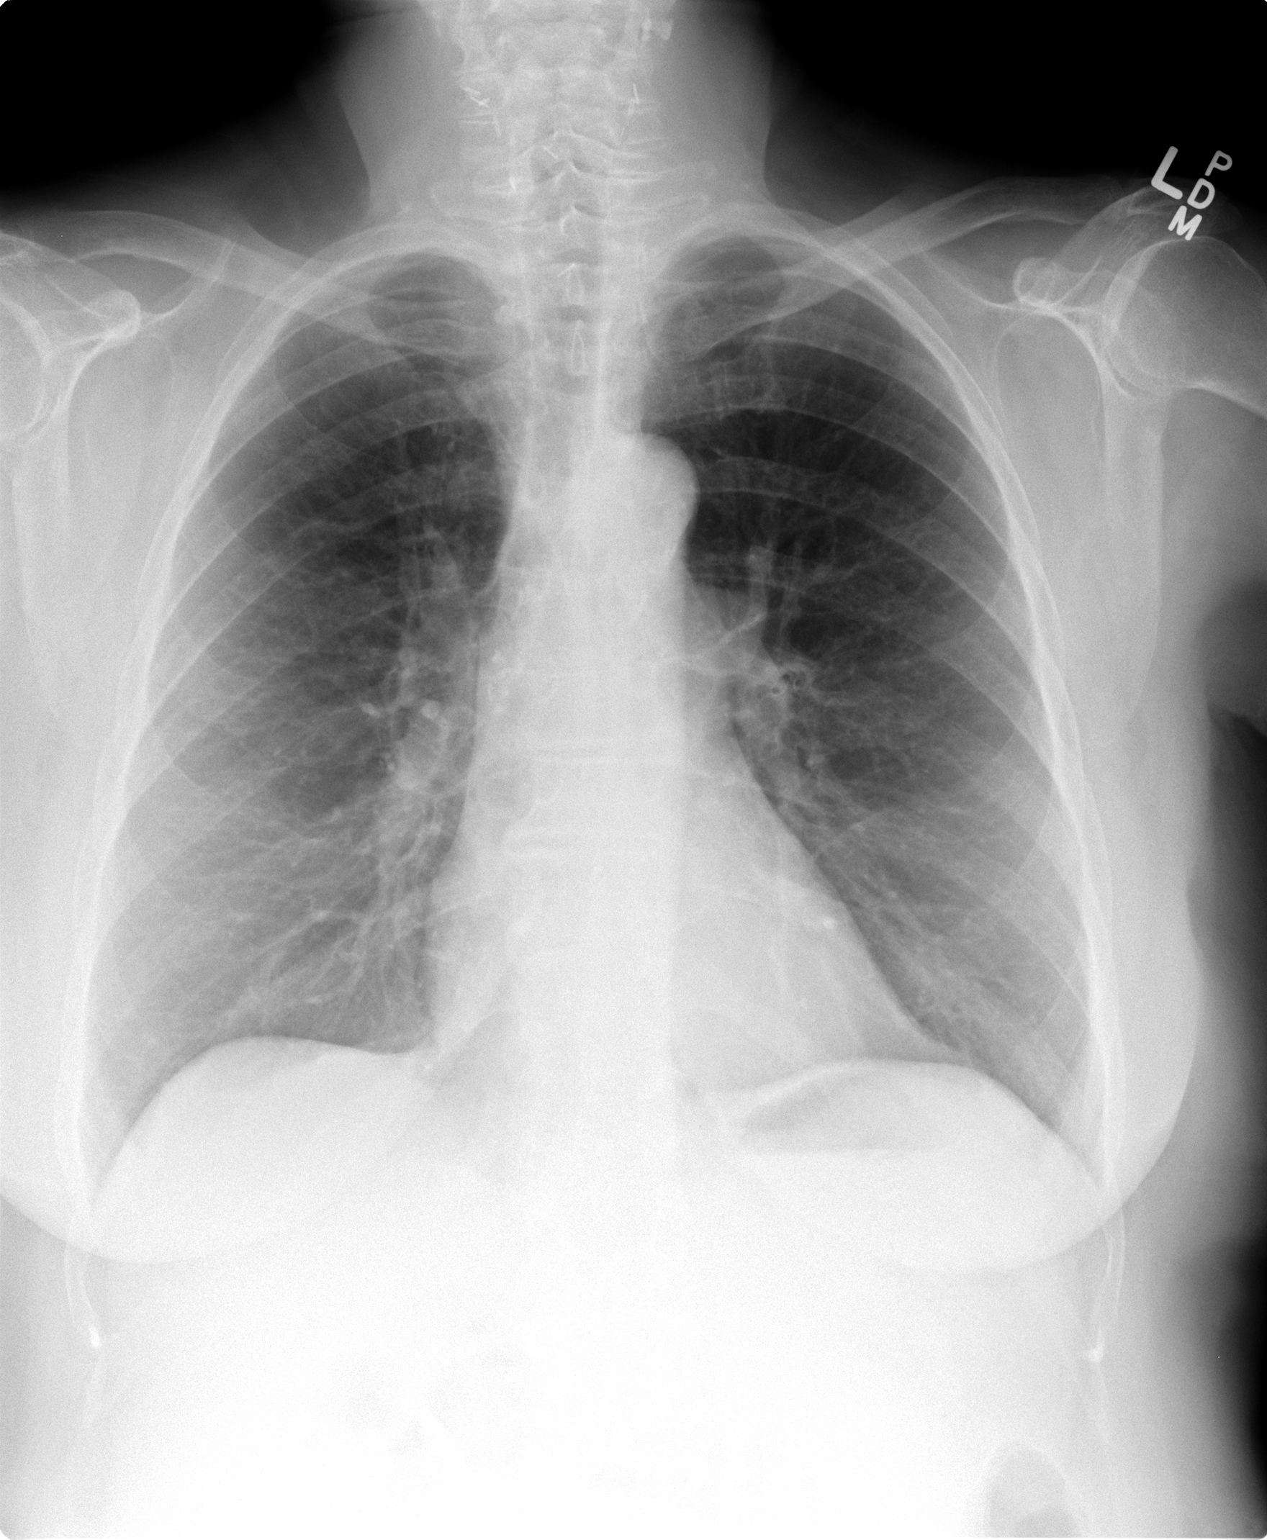

[view not recorded (2 of 2)]
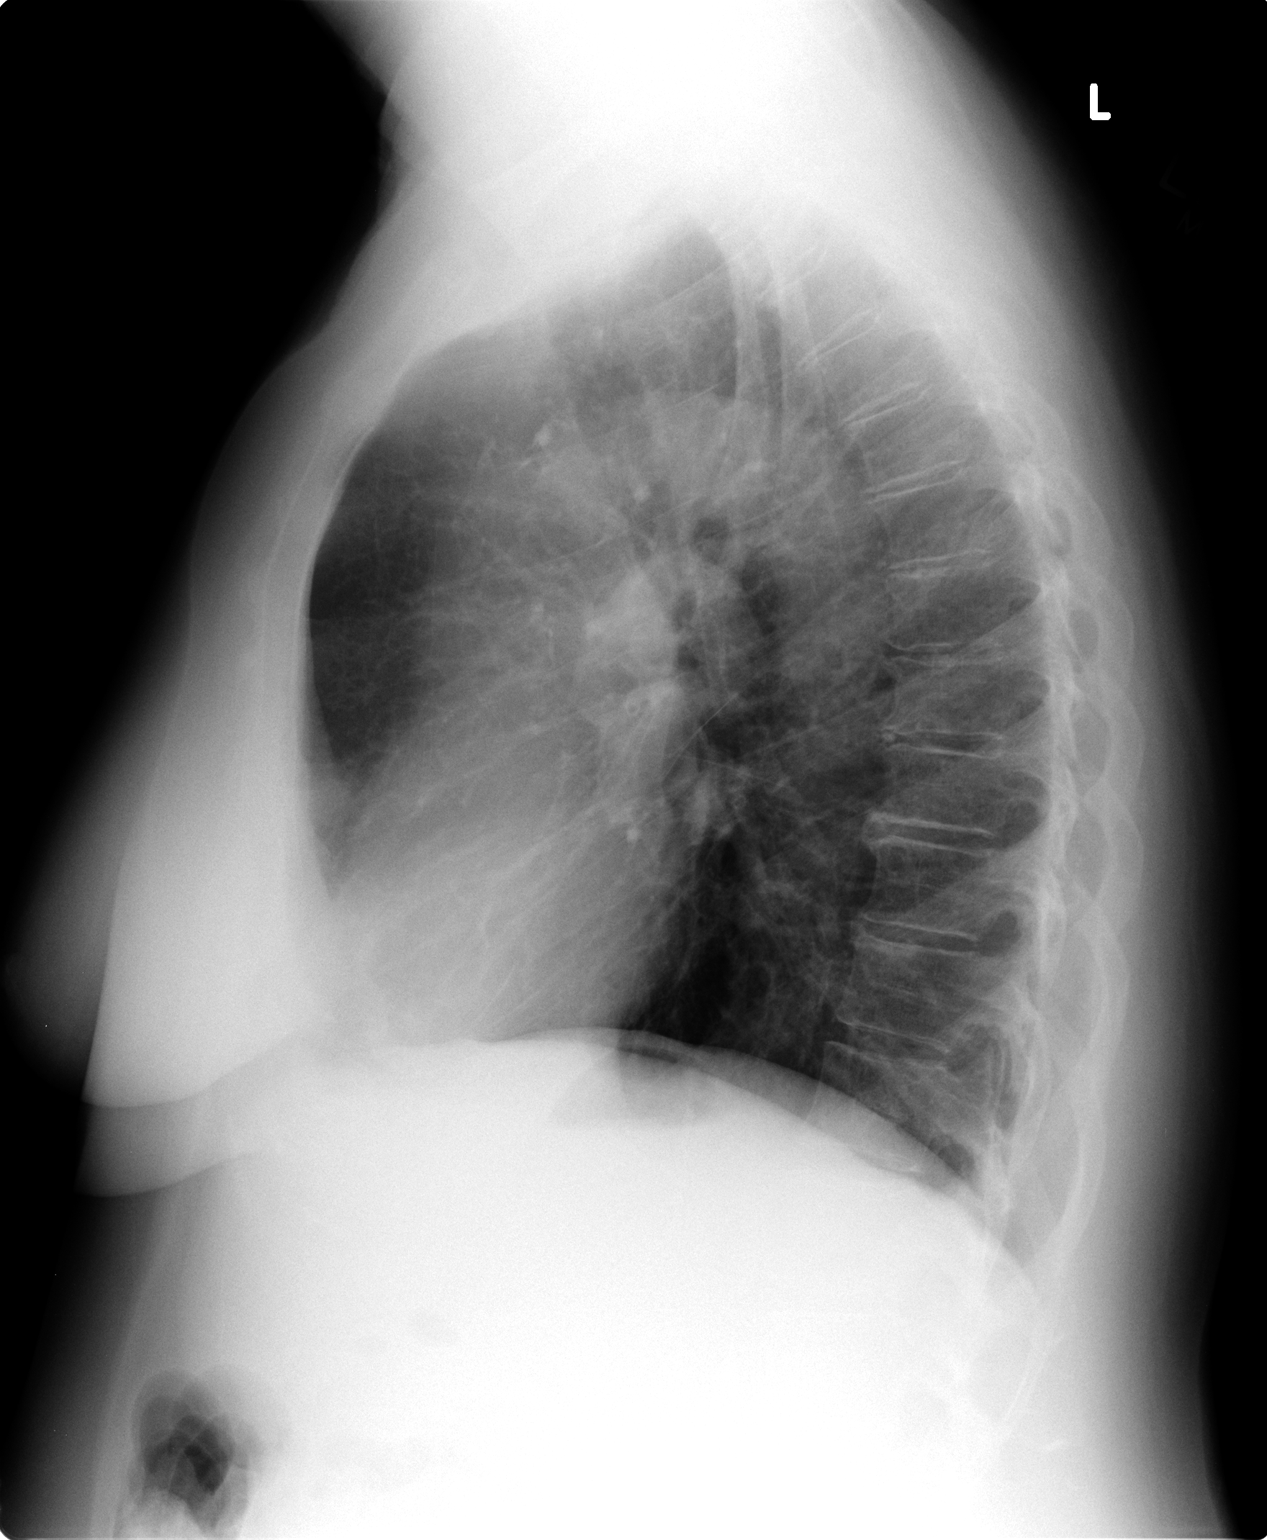

[2 of 2 positions shown; findings below may reference images not displayed]

FINDINGS: Mild peribronchial cuffing. Heart and mediastinal contours are
within normal limits. No focal opacities or effusions. No acute bony
abnormality.
IMPRESSION: Mild bronchitic changes.

## 2014-10-13 MED ORDER — OLMESARTAN MEDOXOMIL-HCTZ 40-25 MG PO TABS: 0.5000 | ORAL_TABLET | Freq: Every day | ORAL | Status: DC

## 2014-10-13 MED ORDER — OLMESARTAN MEDOXOMIL-HCTZ 40-25 MG PO TABS: ORAL_TABLET | ORAL | Status: DC

## 2014-10-13 MED ORDER — BISOPROLOL FUMARATE 10 MG PO TABS: 10.0000 mg | ORAL_TABLET | ORAL | Status: DC

## 2014-10-13 MED ORDER — PREDNISONE 10 MG PO TABS: ORAL_TABLET | ORAL | Status: DC

## 2014-10-13 NOTE — Telephone Encounter (Signed)
Most likely from her ACEi and happy to see her but can't really diagnose or treat over the phone effectively  Strongly rec the acei be changed by primary first then appt here 2 weeks later to regroup or come in now and we'll change it ourselves

## 2014-10-13 NOTE — Telephone Encounter (Signed)
Called and spoke with pt and she is aware of cxr results per MW.  Pt voiced her understanding and nothing further is needed.

## 2014-10-13 NOTE — Progress Notes (Signed)
Subjective:     Patient ID: Holly [name], female   DOB: 29-Sep-1946   MRN: [id number]  HPI  33 yowf active smoker with dx of copd around 2005 last seen by K Clance 9/4 15 with "her usual cough / doe " maint symb/spiriva/ saba q 3-4 days   10/13/2014 acute  office visit/ Holly [name] re? aecopd   Chief Complaint  Patient presents with  . Acute Visit    [place] pt here for prod cough with white-yellow mucus, pnd, increased sob X1 month.    much worse cough since late Oct 25th 2015 on acei noted onset cough  immediately p thyroid surgery and persistent dailly since then assoc with worse sob than usual and increase freq of saba use but not helping, much worse day than noct/ assoc with mod hoarseness  No obvious day to day or daytime variabilty or assoc cp or chest tightness, subjective wheeze overt sinus or hb symptoms. No unusual exp hx or h/o childhood pna/ asthma or knowledge of premature birth.  Sleeping ok without nocturnal  or early am exacerbation  of respiratory  c/o's or need for noct saba. Also denies any obvious fluctuation of symptoms with weather or environmental changes or other aggravating or alleviating factors except as outlined above   Current Medications, Allergies, Complete Past Medical History, Past Surgical History, Family History, and Social History were reviewed in [employer] record.  ROS  The following are not active complaints unless bolded sore throat, dysphagia, dental problems, itching, sneezing,  nasal congestion or excess/ purulent secretions, ear ache,   fever, chills, sweats, unintended wt loss, pleuritic or exertional cp, hemoptysis,  orthopnea pnd or leg swelling, presyncope, palpitations, heartburn, abdominal pain, anorexia, nausea, vomiting, diarrhea  or change in bowel or urinary habits, change in stools or urine, dysuria,hematuria,  rash, arthralgias, visual complaints, headache, numbness weakness or ataxia or problems with walking or coordination,   change in mood/affect or memory.        Review of Systems     Objective:   Physical Exam    amb wf brassy upper airway cough and prominent pseudowheeze   Wt Readings from Last 3 Encounters:  10/13/14 144 lb (65.318 kg)  08/11/14 138 lb (62.596 kg)  07/31/14 140 lb 4 oz (63.617 kg)    Vital signs reviewed   HEENT mild turbinate edema.  Oropharynx no thrush or excess pnd or cobblestoning.  No JVD or cervical adenopathy. Mild accessory muscle hypertrophy. Trachea midline, nl thryroid. Chest was hyperinflated by percussion with diminished breath sounds and moderate increased exp time without wheeze. Hoover sign positive at mid inspiration. Regular rate and rhythm without murmur gallop or rub or increase P2 or edema.  Abd: no hsm, nl excursion. Ext warm without cyanosis or clubbing.    CXR PA and Lateral:   10/13/2014 : Mild bronchitic changes.      Assessment:

## 2014-10-13 NOTE — Telephone Encounter (Signed)
Called spoke with patient who reports a cough x1.5 months.  She did see her PCP and was given an antibiotic and prednisone.  She is much improved but still having a lingering prod cough with cream colored mucus, some increased SOB, wheezing, and tightness.  She has been treating with OTC cough syrups with some relief.  Denies any f/c/s, n/v/d, hemoptysis, PND or leg swelling.  Offered appt but pt declined.  [facility] Allergies  Allergen Reactions  . Codeine Nausea And Vomiting   Last ov 9.4.15 KC is off all this week MW please advise, thank you.

## 2014-10-13 NOTE — Patient Instructions (Addendum)
Prednisone 10 mg take  4 each am x 2 days,   2 each am x 2 days,  1 each am x 2 days and stop   Stop lotensin (benazapril) and cozar (losartan) and start benicar 40/25 one half day and if not strong enough take the whole pill  Continue bisoprolol as you are as bystolic is very expensive  Work on inhaler technique:  relax and gently blow all the way out then take a nice smooth deep breath back in, triggering the inhaler at same time you start breathing in.  Hold for up to 5 seconds if you can.  Rinse and gargle with water when done  Try off spiriva just to see if it makes any difference (think of it high octane fuel)   For cough > deslym two tsp every 12 hours as needed     The key is to stop smoking completely before smoking completely stops you!   Please remember to go to the  x-ray department downstairs for your tests - we will call you with the results when they are available.     Please schedule a follow up office visit in 2 weeks, sooner if needed either Anthonette Legato NP or me

## 2014-10-13 NOTE — Telephone Encounter (Signed)
Called, spoke with pt.  Discussed below per Dr. Melvyn Novas.  Pt would like OV today or tomorrow.  OV scheduled today at 1:30 pm with Dr. Melvyn Novas.  Pt aware and voiced no further questions or concerns at this time.

## 2014-10-16 DIAGNOSIS — F17210 Nicotine dependence, cigarettes, uncomplicated: Secondary | ICD-10-CM | POA: Insufficient documentation

## 2014-10-16 NOTE — Assessment & Plan Note (Signed)
PFT's 2011:  FEV1 0.84 (47%), ratio 43, +airtrapping, DLCO 52%.  C/w GOLD III at baseline and still smoking but much worse with upper airway component since ET   DDX of  difficult airways management all start with A and  include Adherence, Ace Inhibitors, Acid Reflux, Active Sinus Disease, Alpha 1 Antitripsin deficiency, Anxiety masquerading as Airways dz,  ABPA,  allergy(esp in young), Aspiration (esp in elderly), Adverse effects of DPI,  Active smokers, plus two Bs  = Bronchiectasis and Beta blocker use..and one C= CHF   Adherence is always the initial "prime suspect" and is a multilayered concern that requires a "trust but verify" approach in every patient - starting with knowing how to use medications, especially inhalers, correctly, keeping up with refills and understanding the fundamental difference between maintenance and prns vs those medications only taken for a very short course and then stopped and not refilled.  The proper method of use, as well as anticipated side effects, of a metered-dose inhaler are discussed and demonstrated to the patient. Improved effectiveness after extensive coaching during this visit to a level of approximately  75% at best > no change symbicort for now  Active smoking discussed separately  ACEi > only way to know is try off x 4 weeks min > see hbp  Adverse effects of dpi > try off spiriva in short run   ? Allergy > Prednisone 10 mg take  4 each am x 2 days,   2 each am x 2 days,  1 each am x 2 days and stop     Each maintenance medication was reviewed in detail including most importantly the difference between maintenance and as needed and under what circumstances the prns are to be used.  Please see instructions for details which were reviewed in writing and the patient given a copy.

## 2014-10-16 NOTE — Assessment & Plan Note (Signed)

## 2014-10-16 NOTE — Assessment & Plan Note (Signed)
ACE inhibitors are problematic in  pts with airway complaints because  even experienced pulmonologists can't always distinguish ace effects from copd/asthma.  By themselves they don't actually cause a problem, much like oxygen can't by itself start a fire, but they certainly serve as a powerful catalyst or enhancer for any "fire"  or inflammatory process in the upper airway, be it caused by an ET  tube or more commonly reflux (especially in the obese or pts with known GERD or who are on biphoshonates).    In the era of ARB near equivalency until we have a better handle on the reversibility of the airway problem, it just makes sense to avoid ACEI  entirely in the short run and then decide later, having established a level of airway control using a reasonable limited regimen, whether to add back ace but even then being very careful to observe the pt for worsening airway control and number of meds used/ needed to control symptoms.    For now consolidate rx with benicar 40/25 and bisoprolol 10 mg daily and adjust down if too strong   See instructions for specific recommendations which were reviewed directly with the patient who was given a copy with highlighter outlining the key components.

## 2014-10-28 ENCOUNTER — Ambulatory Visit (INDEPENDENT_AMBULATORY_CARE_PROVIDER_SITE_OTHER): Payer: Medicare Other | Admitting: Internal Medicine

## 2014-10-28 ENCOUNTER — Encounter: Payer: Self-pay | Admitting: Internal Medicine

## 2014-10-28 VITALS — BP 120/60 | HR 57 | Ht 60.0 in | Wt 142.0 lb

## 2014-10-28 DIAGNOSIS — Z720 Tobacco use: Secondary | ICD-10-CM

## 2014-10-28 DIAGNOSIS — I10 Essential (primary) hypertension: Secondary | ICD-10-CM | POA: Diagnosis not present

## 2014-10-28 DIAGNOSIS — J432 Centrilobular emphysema: Secondary | ICD-10-CM

## 2014-10-28 DIAGNOSIS — F17210 Nicotine dependence, cigarettes, uncomplicated: Secondary | ICD-10-CM

## 2014-10-28 DIAGNOSIS — J449 Chronic obstructive pulmonary disease, unspecified: Secondary | ICD-10-CM

## 2014-10-28 MED ORDER — FLUTTER DEVI: Status: DC

## 2014-10-28 MED ORDER — OLMESARTAN MEDOXOMIL-HCTZ 40-25 MG PO TABS: 0.5000 | ORAL_TABLET | Freq: Every day | ORAL | Status: DC

## 2014-10-28 NOTE — Progress Notes (Signed)
Subjective:     Patient ID: Holly [name], female   DOB: [birth year]   MRN: [id number]    History of Present Illness  26 yowf active smoker with dx of copd around 2005 last seen by Holly [name] 06/20/14 with "her usual cough / doe " maint symb/spiriva/ saba q 3-4 days    History of Present Illness  10/13/2014 acute  office visit/ Holly [name] re? aecopd   Chief Complaint  Patient presents with  . Acute Visit    [place] pt here for prod cough with white-yellow mucus, pnd, increased sob X1 month.    much worse cough since late Oct 25th 2015 on acei noted onset cough  immediately p thyroid surgery and persistent dailly since then assoc with worse sob than usual and increase freq of saba use but not helping, much worse day than noct/ assoc with mod hoarseness rec Prednisone 10 mg take  4 each am x 2 days,   2 each am x 2 days,  1 each am x 2 days and stop  Stop lotensin (benazapril) and cozar (losartan) and start benicar 40/25 one half day  Continue bisoprolol as you are as bystolic is very expensive Work on inhaler technique:   Try off spiriva just to see if it makes any difference (think of it high octane fuel)  For cough > deslym two tsp every 12 hours as needed   The key is to stop smoking completely before smoking completely stops you!    10/28/2014 f/u ov/Holly [name] re: copd/ still smoking/ confused with maint vs prns /off acei x 2 weeks Chief Complaint  Patient presents with  . Follow-up    Pt states that her breathing "is the same, maybe a little worse" since the last visit. Her cough has improved some. She is using rescue inhaler on average 3 x per day.  still sleeping on couch R side down can't sleep on back unless propped way  up x years  Mucus is slt tan mucinex dm with saba noct also avg 4 x in 24 h   Using lots of mints  Doe x [employer], leans on cart, uses  [facility] parking   No obvious day to day or daytime variabilty or assoc cp or chest tightness, subjective wheeze overt sinus or hb symptoms. No  unusual exp hx or h/o childhood pna/ asthma or knowledge of premature birth.  Also denies any obvious fluctuation of symptoms with weather or environmental changes or other aggravating or alleviating factors except as outlined above   Current Medications, Allergies, Complete Past Medical History, Past Surgical History, Family History, and Social History were reviewed in [employer] record.  ROS  The following are not active complaints unless bolded sore throat feels raw  dysphagia, dental problems, itching, sneezing,  nasal congestion or excess/ purulent secretions, ear ache,   fever, chills, sweats, unintended wt loss, pleuritic or exertional cp, hemoptysis,  orthopnea pnd or leg swelling, presyncope, palpitations, heartburn, abdominal pain, anorexia, nausea, vomiting, diarrhea  or change in bowel or urinary habits, change in stools or urine, dysuria,hematuria,  rash, arthralgias, visual complaints, headache, numbness weakness or ataxia or problems with walking or coordination,  change in mood/affect or memory.             Objective:   Physical Exam    amb wf    Rattling congested cough   10/28/2014          142  Wt Readings from Last 3 Encounters:  10/13/14 144 lb (65.318 kg)  08/11/14 138 lb (62.596 kg)  07/31/14 140 lb 4 oz (63.617 kg)    Vital signs reviewed   HEENT mild turbinate edema.  Oropharynx no thrush or excess pnd or cobblestoning.  No JVD or cervical adenopathy. Mild accessory muscle hypertrophy. Trachea midline, nl thryroid. Chest was hyperinflated by percussion with diminished breath sounds and moderate increased exp time without wheeze. Hoover sign positive at mid inspiration. Regular rate and rhythm without murmur gallop or rub or increase P2 or edema.  Abd: no hsm, nl excursion. Ext warm without cyanosis or clubbing.    CXR PA and Lateral:   10/13/2014    I personally reviewed images and agree with radiology impression as follows:   Mild  bronchitic changes.      Assessment:

## 2014-10-28 NOTE — Patient Instructions (Signed)
Try prilosec 20mg   Take 30-60 min before first meal of the day and Pepcid 20 mg one bedtime until  Return   GERD (REFLUX)  is an extremely common cause of respiratory symptoms just like yours , many times with no obvious heartburn at all.    It can be treated with medication, but also with lifestyle changes including avoidance of late meals, excessive alcohol, smoking cessation, and avoid fatty foods, chocolate, peppermint, colas, red wine, and acidic juices such as orange juice.  NO MINT OR MENTHOL PRODUCTS SO NO COUGH DROPS  USE SUGARLESS CANDY INSTEAD (Jolley ranchers or Stover's or Life Savers) or even ice chips will also do - the key is to swallow to prevent all throat clearing. NO OIL BASED VITAMINS - use powdered substitutes.  For cough > mucinex dm 1200 mg every 12 hours and use flutter valve as much as you possibly can  See Tammy NP w/in 2 weeks with all your medications, even over the counter meds, separated in two separate bags, the ones you take no matter what vs the ones you stop once you feel better and take only as needed when you feel you need them.   Tammy  will generate for you a new user friendly medication calendar that will put [place] all on the same page re: your medication use and set up follow up with me with PFTs   .

## 2014-11-01 NOTE — Assessment & Plan Note (Signed)
Trial off acei 10/13/14     Adequate control on present rx, reviewed > no change in rx needed  > continue off acei

## 2014-11-01 NOTE — Assessment & Plan Note (Signed)

## 2014-11-01 NOTE — Assessment & Plan Note (Addendum)
Continues with severe copd with refractory symptoms already on max maint rx  DDX of  difficult airways management all start with A and  include Adherence, Ace Inhibitors, Acid Reflux, Active Sinus Disease, Alpha 1 Antitripsin deficiency, Anxiety masquerading as Airways dz,  ABPA,  allergy(esp in young), Aspiration (esp in elderly), Adverse effects of DPI,  Active smokers, plus two Bs  = Bronchiectasis and Beta blocker use..and one C= CHF   Adherence is always the initial "prime suspect" and is a multilayered concern that requires a "trust but verify" approach in every patient - starting with knowing how to use medications, especially inhalers, correctly, keeping up with refills and understanding the fundamental difference between maintenance and prns vs those medications only taken for a very short course and then stopped and not refilled.   Adherence is always the initial "prime suspect" and is a multilayered concern that requires a "trust but verify" approach in every patient - starting with knowing how to use medications, especially inhalers, correctly, keeping up with refills and understanding the fundamental difference between maintenance and prns vs those medications only taken for a very short course and then stopped and not refilled.  - very confused with maint vs prns - The proper method of use, as well as anticipated side effects, of a metered-dose inhaler are discussed and demonstrated to the patient. Improved effectiveness after extensive coaching during this visit to a level of approximately  75% > continue symbicort  ? Acid (or non-acid) GERD > always difficult to exclude as up to 75% of pts in some series report no assoc GI/ Heartburn symptoms> rec continue max (24h)  acid suppression and diet restrictions/ reviewed     ? acei > still on board, too soon to call  Will add flutter at this point   Next step  To keep things simple, I have asked the patient to first separate medicines that  are perceived as maintenance, that is to be taken daily "no matter what", from those medicines that are taken on only on an as-needed basis and I have given the patient examples of both, and then return to see our NP to generate a  detailed  medication calendar which should be followed until the next physician sees the patient and updates it.

## 2014-11-11 ENCOUNTER — Encounter: Payer: Self-pay | Admitting: Adult Health

## 2014-11-11 ENCOUNTER — Ambulatory Visit (INDEPENDENT_AMBULATORY_CARE_PROVIDER_SITE_OTHER): Payer: Medicare Other | Admitting: Adult Health

## 2014-11-11 VITALS — BP 128/74 | HR 57 | Temp 98.0°F | Ht 60.0 in | Wt 143.4 lb

## 2014-11-11 DIAGNOSIS — J9611 Chronic respiratory failure with hypoxia: Secondary | ICD-10-CM | POA: Diagnosis not present

## 2014-11-11 DIAGNOSIS — J449 Chronic obstructive pulmonary disease, unspecified: Secondary | ICD-10-CM | POA: Diagnosis not present

## 2014-11-11 DIAGNOSIS — J432 Centrilobular emphysema: Secondary | ICD-10-CM

## 2014-11-11 DIAGNOSIS — I10 Essential (primary) hypertension: Secondary | ICD-10-CM | POA: Diagnosis not present

## 2014-11-11 MED ORDER — BUDESONIDE-FORMOTEROL FUMARATE 160-4.5 MCG/ACT IN AERO: 2.0000 | INHALATION_SPRAY | Freq: Two times a day (BID) | RESPIRATORY_TRACT | Status: DC

## 2014-11-11 MED ORDER — TIOTROPIUM BROMIDE MONOHYDRATE 18 MCG IN CAPS: 18.0000 ug | ORAL_CAPSULE | Freq: Every day | RESPIRATORY_TRACT | Status: DC

## 2014-11-11 MED ORDER — OLMESARTAN MEDOXOMIL-HCTZ 40-25 MG PO TABS: 0.5000 | ORAL_TABLET | Freq: Every day | ORAL | Status: DC

## 2014-11-11 NOTE — Assessment & Plan Note (Signed)
Ambulatory Desaturations in office today Suspect due to her progressive COPD decline, and currently slow to resolve exacerbation Advised on oxygen use. However, she declines Encouraged on smoking cessation We'll recheck on return in 6-8 weeks  Educated on the  dangers of hypoxia

## 2014-11-11 NOTE — Assessment & Plan Note (Addendum)
Slow to resolve exacerbation in patient with ongoing smoking Spirometry shows progressive COPD decline with FEV1 now at 22%. Advised on smoking cessation We discussed oxygen initiation, however, she declines  Plan Restart Spiriva Handihaler 1 puff daily  Continue on Symbicort 2 puffs Twice daily  , rinse after use.  Mucinex DM Twice daily  As needed  Cough/congestion .  Follow Med calendar closely and bring to each visit .  Follow up Dr. Gwenette Greet in 6 weeks and As needed   Please contact office for sooner follow up if symptoms do not improve or worsen or seek emergency care

## 2014-11-11 NOTE — Progress Notes (Signed)
Subjective:     Patient ID: Holly [name], female   DOB: 1946/06/05   MRN: [id number]    History of Present Illness  89 yowf active smoker with dx of copd around 2005 last seen by K Clance 06/20/14 with "her usual cough / doe " maint symb/spiriva/ saba q 3-4 days    History of Present Illness  10/13/2014 acute  office visit/ [facility] re? aecopd   Chief Complaint  Patient presents with  . Acute Visit    [facility] pt here for prod cough with white-yellow mucus, pnd, increased sob X1 month.    much worse cough since late Oct 25th 2015 on acei noted onset cough  immediately p thyroid surgery and persistent dailly since then assoc with worse sob than usual and increase freq of saba use but not helping, much worse day than noct/ assoc with mod hoarseness rec Prednisone 10 mg take  4 each am x 2 days,   2 each am x 2 days,  1 each am x 2 days and stop  Stop lotensin (benazapril) and cozar (losartan) and start benicar 40/25 one half day  Continue bisoprolol as you are as bystolic is very expensive Work on inhaler technique:   Try off spiriva just to see if it makes any difference (think of it high octane fuel)  For cough > deslym two tsp every 12 hours as needed   The key is to stop smoking completely before smoking completely stops you!    10/28/2014 f/u ov/[facility] re: copd/ still smoking/ confused with maint vs prns /off acei x 2 weeks Chief Complaint  Patient presents with  . Follow-up    Pt states that her breathing "is the same, maybe a little worse" since the last visit. Her cough has improved some. She is using rescue inhaler on average 3 x per day.  still sleeping on couch R side down can't sleep on back unless propped way  up x years  Mucus is slt tan mucinex dm with saba noct also avg 4 x in 24 h   Using lots of mints  Doe x [employer], leans on cart, uses  [facility] parking   >>PPI /Pepcid   11/11/2014 Follow up and Med Review  Patient returns for a two-week follow-up and medication review. We  reviewed all her medications and organized them into a medication calendar with patient education Patient has had a slow to resolve COPD exacerbation. Over the last month She was treated with antibiotics and a steroid taper. Her ACE inhibitor was stopped. Says that she's had minimal improvement in symptoms. Continues to get short of breath with walking. Has cough with white mucus. He was also instructed to hold her Spiriva due to possible upper airway irritation from dry powder She has not seen much improvement in her cough and wheezing. Chest x-ray showed no acute findings Continues to smoke 1 PPD . Discussed smoking cessation Spirometry today shows FEV1 at 22%  FVC 41%  Ratio 41 Previous FEV1 in 2011 was at 47%. Walk in office showed O2 sats drop to 87% on RA , adequate on 2l/m  We discussed O2 -she declines at present time    Current Medications, Allergies, Complete Past Medical History, Past Surgical History, Family History, and Social History were reviewed in [employer] record.  ROS  The following are not active complaints unless bolded sore throat feels raw  dysphagia, dental problems, itching, sneezing,  nasal congestion or excess/ purulent secretions, ear ache,  fever, chills, sweats, unintended wt loss, pleuritic or exertional cp, hemoptysis,  orthopnea pnd or leg swelling, presyncope, palpitations, heartburn, abdominal pain, anorexia, nausea, vomiting, diarrhea  or change in bowel or urinary habits, change in stools or urine, dysuria,hematuria,  rash, arthralgias, visual complaints, headache, numbness weakness or ataxia or problems with walking or coordination,  change in mood/affect or memory.             Objective:   Physical Exam    amb wf      Vital signs reviewed   HEENT mild turbinate edema.  Oropharynx no thrush or excess pnd or cobblestoning.  No JVD or cervical adenopathy. Mild accessory muscle hypertrophy. Trachea midline, nl thryroid.  Chest was hyperinflated by percussion with diminished breath sounds and moderate increased exp time without wheeze. Hoover sign positive at mid inspiration. Regular rate and rhythm without murmur gallop or rub or increase P2 or edema.  Abd: no hsm, nl excursion. Ext warm without cyanosis or clubbing.    CXR PA and Lateral:   10/13/2014  Mild bronchitic changes.      Assessment:

## 2014-11-11 NOTE — Addendum Note (Signed)
Addended by: Parke Poisson E on: 11/11/2014 05:24 PM   Modules accepted: Orders, Medications

## 2014-11-11 NOTE — Patient Instructions (Signed)
Restart Spiriva Handihaler 1 puff daily  Continue on Symbicort 2 puffs Twice daily  , rinse after use.  Mucinex DM Twice daily  As needed  Cough/congestion .  Follow Med calendar closely and bring to each visit .  Follow up Dr. Gwenette Greet in 6 weeks and As needed   Please contact office for sooner follow up if symptoms do not improve or worsen or seek emergency care

## 2014-11-11 NOTE — Progress Notes (Signed)
Ov reviewed and agree with plan as outlined.

## 2014-11-11 NOTE — Assessment & Plan Note (Signed)
Blood pressure is controlled on current regimen Would avoid ACE inhibitor is in the future with underlying COPD and tendency towards exacerbations Follow-up with primary care for blood pressure control

## 2014-11-19 ENCOUNTER — Telehealth: Payer: Self-pay | Admitting: Pulmonary Disease

## 2014-11-19 MED ORDER — BISOPROLOL FUMARATE 10 MG PO TABS: 10.0000 mg | ORAL_TABLET | ORAL | Status: DC

## 2014-11-19 MED ORDER — OLMESARTAN MEDOXOMIL-HCTZ 20-12.5 MG PO TABS: 1.0000 | ORAL_TABLET | Freq: Every day | ORAL | Status: DC

## 2014-11-19 NOTE — Telephone Encounter (Signed)
Yes continue biosoprolol and ok to reduce dose of benicar  If doing great with cough/ breathing then should make appt for ov with primary doctor Harlan Stains)  for ongoing care for hbp and let her take over (this is distinctly different from calling in and asking for refills from Dr Dema Severin as she needs to be able to see the pt first and judge if the meds are working right) - if not doing well resp wise tell her to return here with all meds in hand and we'll regroup and adjust her bp meds both at same ov if indicated   Send copy of this message to Dr Dema Severin

## 2014-11-19 NOTE — Telephone Encounter (Signed)
Spoke with pt and advised of Dr Gustavus Bryant recommendations.  Rx for Bisoprolol and benicar sent.  Copy of this note forwarded to Dr Dema Severin.

## 2014-11-19 NOTE — Telephone Encounter (Signed)
Pt would like to know from MW if she should continue to Bisoprolol as given at 10-13-14 OV with MW(bystolic too expensive)---Dr White patients PCP would not give refills for this as they did not give to start with.  Also, pt would like Rx for Benicar HCT sent to pharmacy-she would like to have Benicar HCT 20/12.5 take 1 daily instead of having to cut 40/25 in half.   MW Please advise. Thanks.

## 2014-11-20 ENCOUNTER — Telehealth: Payer: Self-pay | Admitting: Pulmonary Disease

## 2014-11-20 MED ORDER — OLMESARTAN MEDOXOMIL-HCTZ 20-12.5 MG PO TABS: 1.0000 | ORAL_TABLET | Freq: Every day | ORAL | Status: DC

## 2014-11-20 NOTE — Telephone Encounter (Signed)
i have it in the top drawer of my credenza > give her 2 weeks and have her see Tammy NP with formulary from her insurance coverage and we'll pick her an alternative

## 2014-11-20 NOTE — Telephone Encounter (Signed)
Samples up front for pick up  ATC the pt, NA, and mailbox full  Will need to call back

## 2014-11-20 NOTE — Telephone Encounter (Signed)
Spoke with the pt  She states went to the pharmacy to pick up benicar today and med cost 140$$ She wants to know what to do now  She does not have pending appt with PCP  Please advise, thanks!

## 2014-11-21 NOTE — Addendum Note (Signed)
Addended by: Parke Poisson E on: 11/21/2014 01:48 PM   Modules accepted: Orders, Medications

## 2014-11-21 NOTE — Telephone Encounter (Signed)
Pt calling back I informed her that samples were ready for pick up.Holly [name]

## 2014-11-24 ENCOUNTER — Other Ambulatory Visit: Payer: Self-pay | Admitting: Family Medicine

## 2014-11-24 ENCOUNTER — Ambulatory Visit (INDEPENDENT_AMBULATORY_CARE_PROVIDER_SITE_OTHER): Payer: Medicare Other | Admitting: Adult Health

## 2014-11-24 ENCOUNTER — Encounter: Payer: Self-pay | Admitting: Adult Health

## 2014-11-24 VITALS — BP 126/78 | HR 65 | Temp 98.0°F | Ht 60.0 in | Wt 146.0 lb

## 2014-11-24 DIAGNOSIS — J441 Chronic obstructive pulmonary disease with (acute) exacerbation: Secondary | ICD-10-CM | POA: Diagnosis not present

## 2014-11-24 DIAGNOSIS — I6523 Occlusion and stenosis of bilateral carotid arteries: Secondary | ICD-10-CM

## 2014-11-24 MED ORDER — DOXYCYCLINE HYCLATE 100 MG PO TABS: 100.0000 mg | ORAL_TABLET | Freq: Two times a day (BID) | ORAL | Status: DC

## 2014-11-24 MED ORDER — PREDNISONE 10 MG PO TABS: ORAL_TABLET | ORAL | Status: DC

## 2014-11-24 NOTE — Patient Instructions (Signed)
Doxycycline 100mg  Twice daily   Prednisone taper over next week.  Continue on Spiriva Handihaler 1 puff daily  Continue on Symbicort 2 puffs Twice daily  , rinse after use.  Mucinex DM Twice daily  As needed  Cough/congestion .  Please contact office for sooner follow up if symptoms do not improve or worsen or seek emergency care  follow up Dr. Gwenette Greet as planned and As needed

## 2014-11-24 NOTE — Progress Notes (Signed)
Ov reviewed, and agree with plan as outlined.  

## 2014-11-24 NOTE — Assessment & Plan Note (Signed)
Flare in active smoker  Smoking cessation discussed   Plan  Doxycycline 100mg  Twice daily   Prednisone taper over next week.  Continue on Spiriva Handihaler 1 puff daily  Continue on Symbicort 2 puffs Twice daily  , rinse after use.  Mucinex DM Twice daily  As needed  Cough/congestion .  Please contact office for sooner follow up if symptoms do not improve or worsen or seek emergency care  follow up Dr. Gwenette Greet as planned and As needed

## 2014-11-24 NOTE — Progress Notes (Signed)
Subjective:     Patient ID: Holly [name], female   DOB: Jun 26, 1946   MRN: [id number]    History of Present Illness  33 yowf active smoker with dx of copd around 2005 last seen by K Clance 06/20/14 with "her usual cough / doe " maint symb/spiriva/ saba q 3-4 days    History of Present Illness  10/13/2014 acute  office visit/ Wert re? aecopd   Chief Complaint  Patient presents with  . Acute Visit    [place] pt here for prod cough with white-yellow mucus, pnd, increased sob X1 month.    much worse cough since late Oct 25th 2015 on acei noted onset cough  immediately p thyroid surgery and persistent dailly since then assoc with worse sob than usual and increase freq of saba use but not helping, much worse day than noct/ assoc with mod hoarseness rec Prednisone 10 mg take  4 each am x 2 days,   2 each am x 2 days,  1 each am x 2 days and stop  Stop lotensin (benazapril) and cozar (losartan) and start benicar 40/25 one half day  Continue bisoprolol as you are as bystolic is very expensive Work on inhaler technique:   Try off spiriva just to see if it makes any difference (think of it high octane fuel)  For cough > deslym two tsp every 12 hours as needed   The key is to stop smoking completely before smoking completely stops you!    10/28/2014 f/u ov/Wert re: copd/ still smoking/ confused with maint vs prns /off acei x 2 weeks Chief Complaint  Patient presents with  . Follow-up    Pt states that her breathing "is the same, maybe a little worse" since the last visit. Her cough has improved some. She is using rescue inhaler on average 3 x per day.  still sleeping on couch R side down can't sleep on back unless propped way  up x years  Mucus is slt tan mucinex dm with saba noct also avg 4 x in 24 h   Using lots of mints  Doe x [employer], leans on cart, uses  [facility] parking   >>PPI /Pepcid   11/11/2014 Follow up and Med Review  Patient returns for a two-week follow-up and medication review. We  reviewed all her medications and organized them into a medication calendar with patient education Patient has had a slow to resolve COPD exacerbation. Over the last month She was treated with antibiotics and a steroid taper. Her ACE inhibitor was stopped. Says that she's had minimal improvement in symptoms. Continues to get short of breath with walking. Has cough with white mucus. He was also instructed to hold her Spiriva due to possible upper airway irritation from dry powder She has not seen much improvement in her cough and wheezing. Chest x-ray showed no acute findings Continues to smoke 1 PPD . Discussed smoking cessation Spirometry today shows FEV1 at 22%  FVC 41%  Ratio 41 Previous FEV1 in 2011 was at 47%. Walk in office showed O2 sats drop to 87% on RA , adequate on 2l/m  We discussed O2 -she declines at present time   11/24/2014 Acute OV  Complains of productive cough, congestion with thick yellow mucus for 3 days .  Wheezing worse for last week.  Taking mucinex without much help.  We discussed smoking cessation in long detail.  No chest pain, orthopnea, hemoptysis, fever or n/v/d.     Current Medications, Allergies, Complete  Past Medical History, Past Surgical History, Family History, and Social History were reviewed in [employer] record.  ROS  The following are not active complaints unless bolded dysphagia, dental problems, itching, sneezing,  nasal congestion or excess/ purulent secretions, ear ache,   fever, chills, sweats, unintended wt loss, pleuritic or exertional cp, hemoptysis,  orthopnea pnd or leg swelling, presyncope, palpitations, heartburn, abdominal pain, anorexia, nausea, vomiting, diarrhea  or change in bowel or urinary habits, change in stools or urine, dysuria,hematuria,  rash, arthralgias, visual complaints, headache, numbness weakness or ataxia or problems with walking or coordination,  change in mood/affect or memory.              Objective:   Physical Exam    amb wf    Vital signs reviewed   HEENT mild turbinate edema.  Oropharynx no thrush or excess pnd or cobblestoning.  No JVD or cervical adenopathy. Mild accessory muscle hypertrophy. Trachea midline, nl thryroid. Chest with few rhonchi, and tr wheeze . Regular rate and rhythm without murmur gallop or rub or increase P2 or edema.  Abd: no hsm, nl excursion. Ext warm without cyanosis or clubbing.    CXR PA and Lateral:   10/13/2014  Mild bronchitic changes.      Assessment:

## 2014-11-26 ENCOUNTER — Ambulatory Visit
Admission: RE | Admit: 2014-11-26 | Discharge: 2014-11-26 | Disposition: A | Discharge status: Home / Self Care | Payer: Medicare Other | Source: Ambulatory Visit | Attending: Family Medicine | Admitting: Family Medicine

## 2014-11-26 DIAGNOSIS — I6523 Occlusion and stenosis of bilateral carotid arteries: Secondary | ICD-10-CM

## 2014-11-26 IMAGING — US US CAROTID DUPLEX BILAT
1 series · 13 of 24 positions shown · non-contrast
Comparison: None.

CLINICAL DATA: Carotid atherosclerosis, hypertension, tobacco use

EXAM:
BILATERAL CAROTID DUPLEX ULTRASOUND
TECHNIQUE: Gray scale imaging, color Doppler and duplex ultrasound were
performed of bilateral carotid and vertebral arteries in the neck.

[Series 1: us carotid duplex bilat · 0.08mm/px · 13 of 77 slices shown]
[im 1/77]
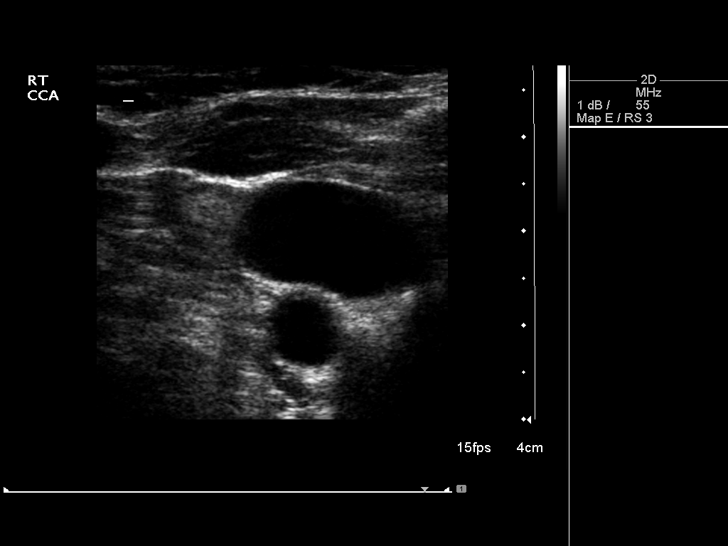
[im 7/77]
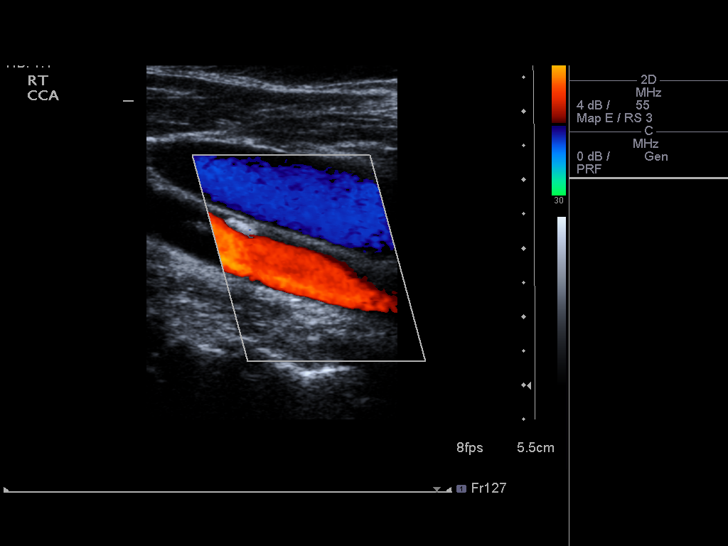
[im 14/77]
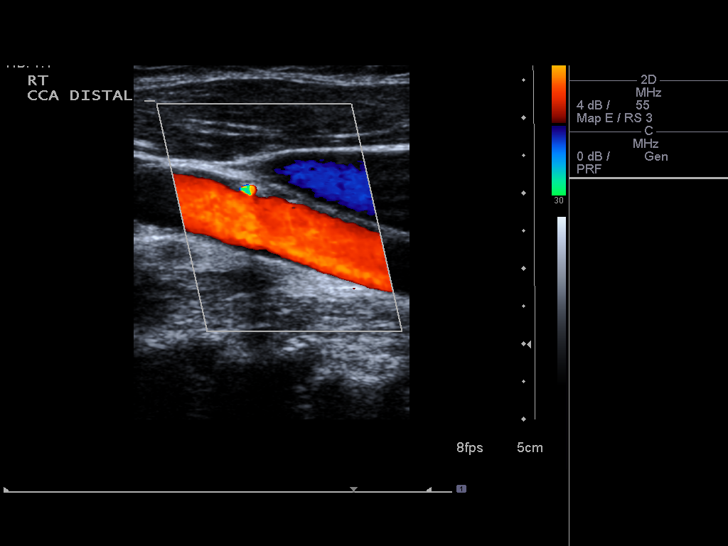
[im 20/77]
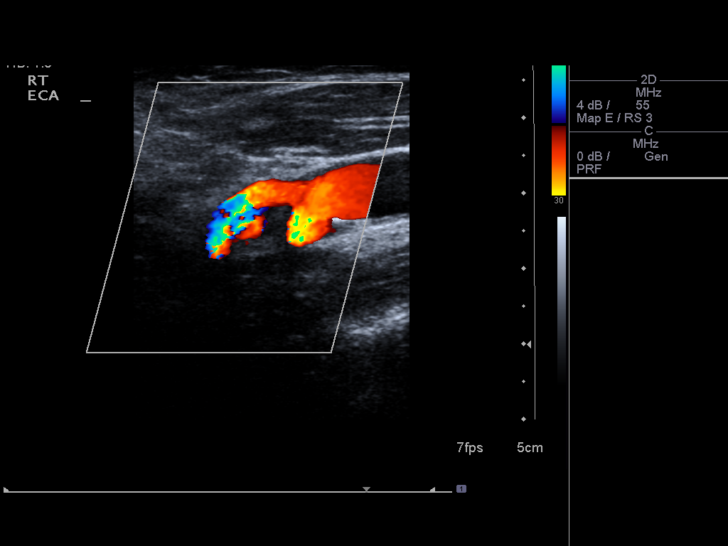
[im 27/77]
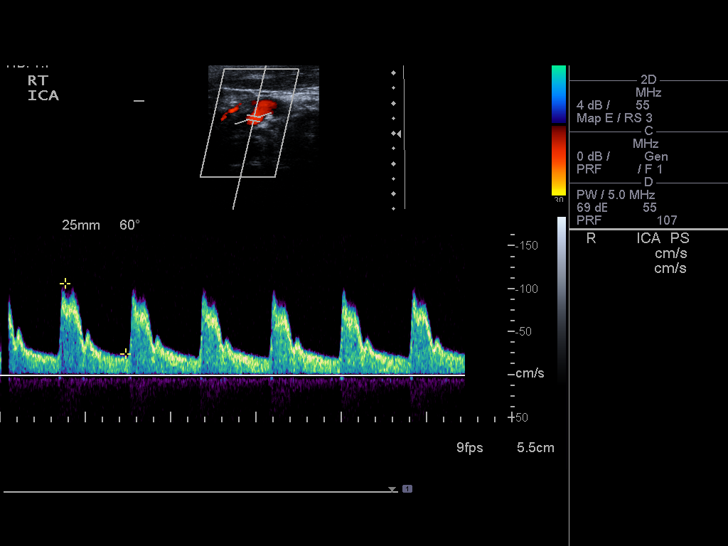
[im 34/77]
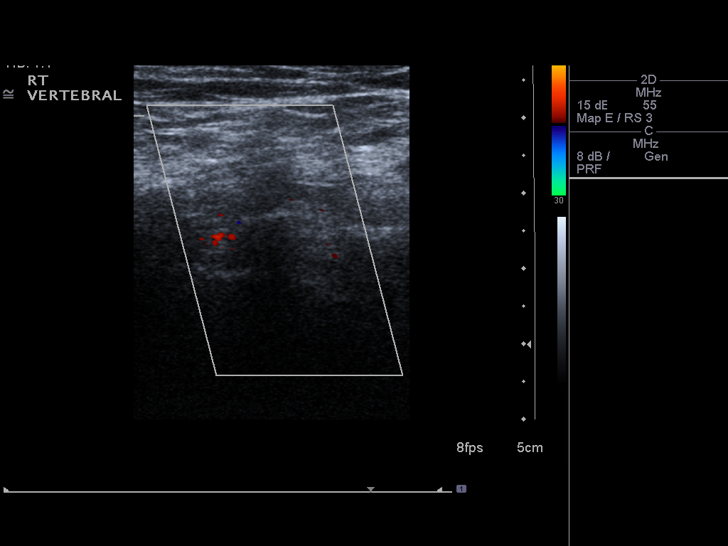
[im 40/77]
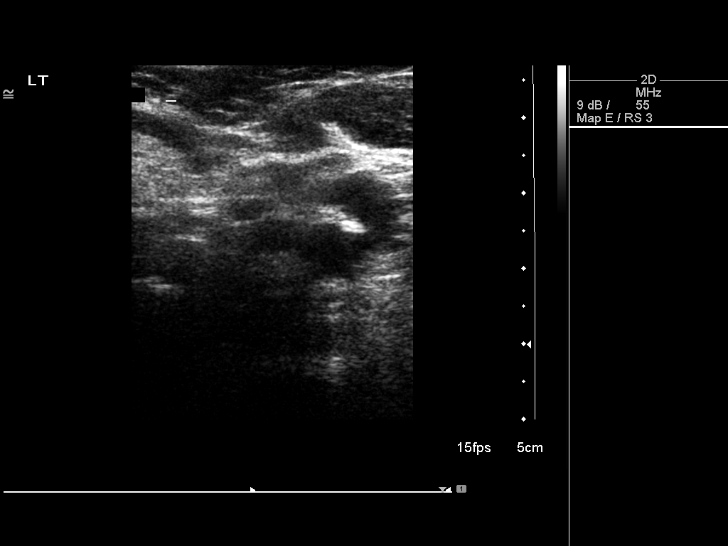
[im 43/77]
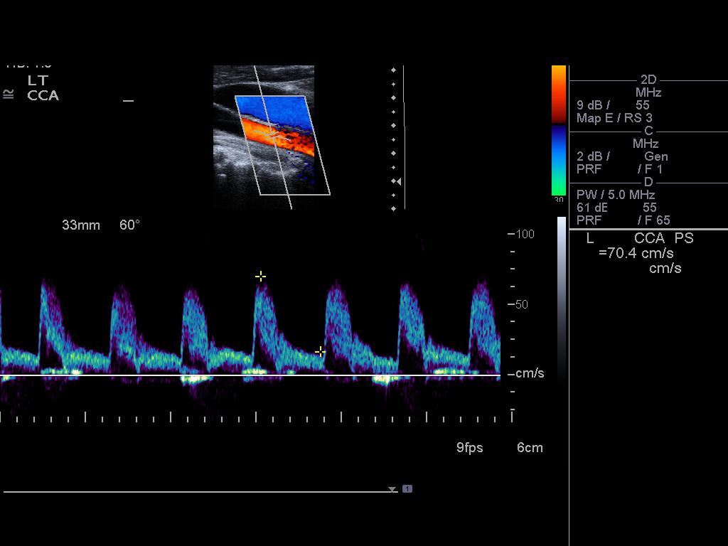
[im 50/77]
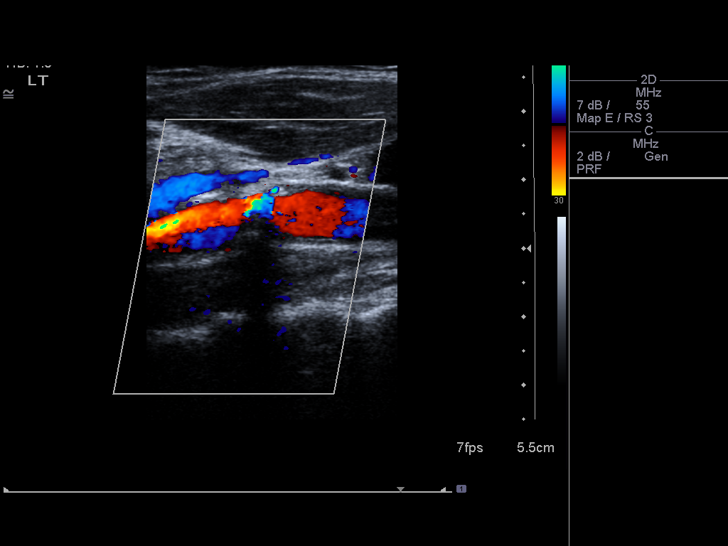
[im 57/77]
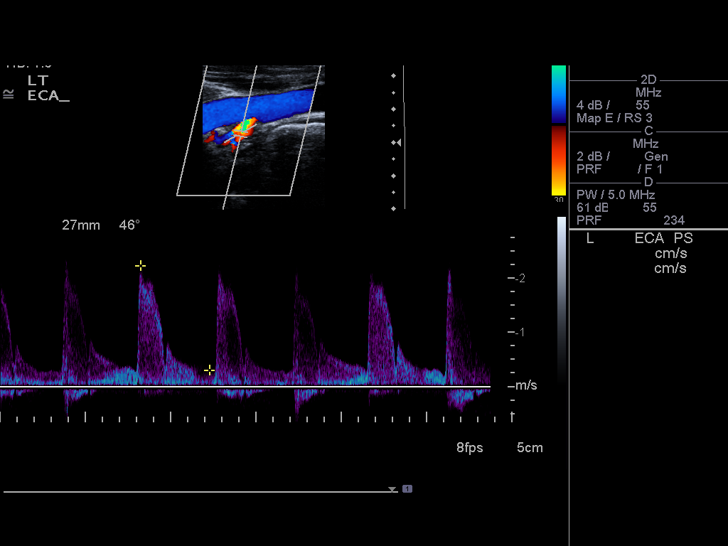
[im 63/77]
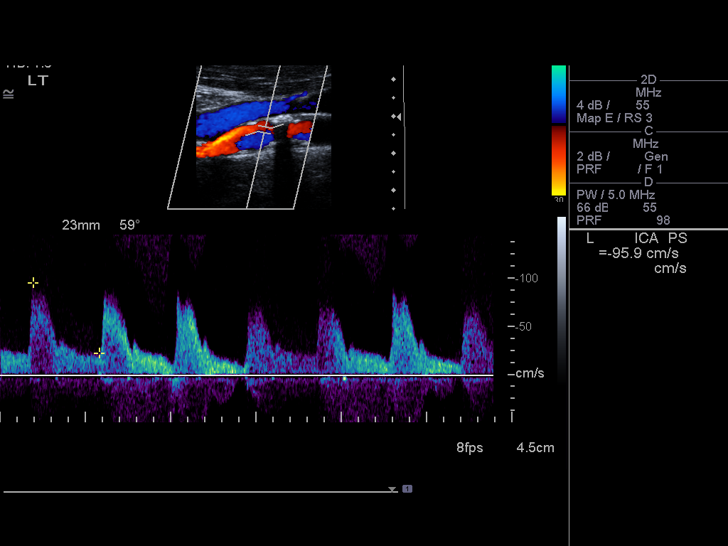
[im 70/77]
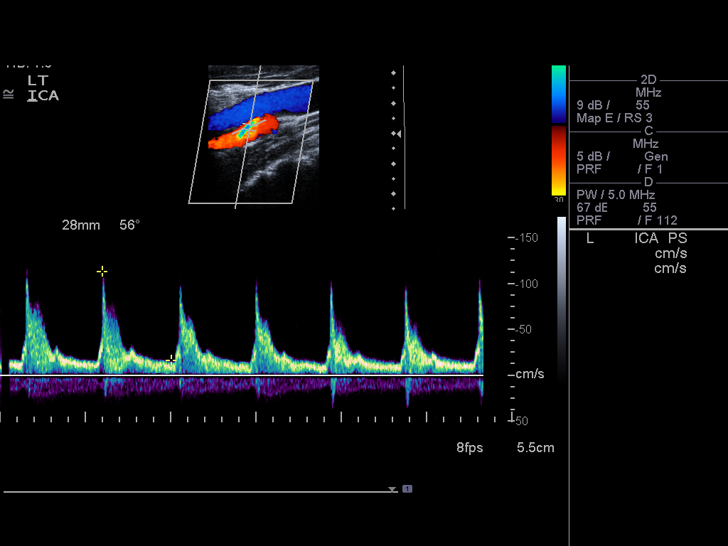
[im 77/77]
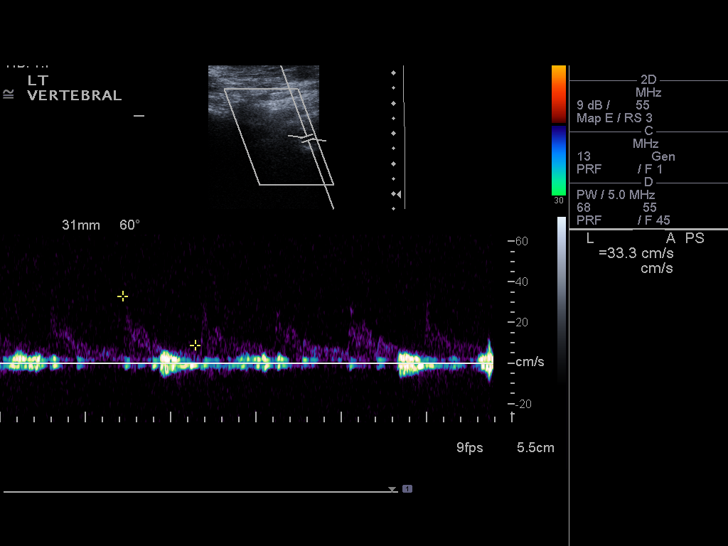

[13 of 24 positions shown; findings below may reference images not displayed]

FINDINGS: Criteria: Quantification of carotid stenosis is based on velocity
parameters that correlate the residual internal carotid diameter
with NASCET-based stenosis levels, using the diameter of the distal
internal carotid lumen as the denominator for stenosis measurement.

The following velocity measurements were obtained:

RIGHT

ICA:  107/25 cm/sec

CCA:  81/15 cm/sec

SYSTOLIC ICA/CCA RATIO:

DIASTOLIC ICA/CCA RATIO:

ECA:  111 cm/sec

LEFT

ICA:  216/41 cm/sec

CCA:  70/17 cm/sec

SYSTOLIC ICA/CCA RATIO:

DIASTOLIC ICA/CCA RATIO:

ECA:  284 cm/sec

RIGHT CAROTID ARTERY: Minor echogenic shadowing plaque formation. No
hemodynamically significant right ICA stenosis, velocity elevation,
or turbulent flow. Degree of narrowing less than 50%.

RIGHT VERTEBRAL ARTERY:  Antegrade

LEFT CAROTID ARTERY: Moderate proximal right ICA echogenic
shadowing. In this region, there is luminal narrowing by grayscale
imaging. Turbulent flow noted. There is a corresponding elevated
velocity of 216/41. Degree of stenosis is estimated at 50- 69%.

LEFT VERTEBRAL ARTERY:  Antegrade
IMPRESSION: Left greater than right carotid atherosclerosis.

Left proximal ICA stenosis estimated at 50- 69%.

Right ICA narrowing less than 50%

Patent antegrade vertebral flow bilaterally.

## 2014-12-23 ENCOUNTER — Encounter: Payer: Self-pay | Admitting: Pulmonary Disease

## 2014-12-23 ENCOUNTER — Ambulatory Visit (INDEPENDENT_AMBULATORY_CARE_PROVIDER_SITE_OTHER): Payer: Medicare Other | Admitting: Pulmonary Disease

## 2014-12-23 VITALS — BP 122/70 | HR 65 | Temp 97.5°F | Ht 60.0 in | Wt 148.0 lb

## 2014-12-23 DIAGNOSIS — I6523 Occlusion and stenosis of bilateral carotid arteries: Secondary | ICD-10-CM

## 2014-12-23 DIAGNOSIS — J438 Other emphysema: Secondary | ICD-10-CM

## 2014-12-23 DIAGNOSIS — J441 Chronic obstructive pulmonary disease with (acute) exacerbation: Secondary | ICD-10-CM | POA: Diagnosis not present

## 2014-12-23 MED ORDER — PREDNISONE 10 MG PO TABS: ORAL_TABLET | ORAL | Status: DC

## 2014-12-23 NOTE — Patient Instructions (Signed)
Will treat with a course of prednisone over 8 days to help with your breathing. You need to stop smoking 100% No change in your daily breathing medications followup with me again in 20mos, but call if you are not improving with the prednisone.

## 2014-12-23 NOTE — Assessment & Plan Note (Signed)
The patient is having increasing shortness of breath that his most likely related to an acute COPD exacerbation. She has had 2-3 flareups recently, and this is related to ongoing tobacco abuse. I have told her this will continue to be a problem until she is able to stop smoking completely. There is really nothing else to add to her medical regimen at this point, unless she is able to totally quit. I will treat her with a course of prednisone, and if she continues to have issues, she may need a cardiac evaluation for completeness.

## 2014-12-23 NOTE — Progress Notes (Signed)
   Subjective:    Patient ID: Holly [name], female    DOB: 1946-06-07, 69 y.o.   MRN: [id number]  HPI The patient comes in today for an acute sick visit. She has known COPD, and is having recurrent exacerbations because of ongoing airway inflammation from tobacco abuse. The patient has not been able to quit smoking, and has required multiple rounds of prednisone. She gives a one-week history of increasing shortness of breath, but does not have chest congestion, cough, or purulent mucus. She denies any chest heaviness or tightness.   Review of Systems  Constitutional: Negative for fever and unexpected weight change.  HENT: Negative for congestion, dental problem, ear pain, nosebleeds, postnasal drip, rhinorrhea, sinus pressure, sneezing, sore throat and trouble swallowing.   Eyes: Negative for redness and itching.  Respiratory: Positive for shortness of breath and wheezing. Negative for cough and chest tightness.   Cardiovascular: Positive for leg swelling. Negative for palpitations.  Gastrointestinal: Negative for nausea and vomiting.  Genitourinary: Negative for dysuria.  Musculoskeletal: Negative for joint swelling.  Skin: Negative for rash.  Neurological: Negative for headaches.  Hematological: Does not bruise/bleed easily.  Psychiatric/Behavioral: Negative for dysphoric mood. The patient is not nervous/anxious.        Objective:   Physical Exam Well-developed female in no acute distress Nose without purulence or discharge noted Neck without lymphadenopathy or thyromegaly Chest with decreased breath sounds throughout, no crackles or definite wheezes Cardiac exam with regular rate and rhythm Lower extremities with mild ankle edema, no cyanosis Alert and oriented, moves all 4 extremities.       Assessment & Plan:

## 2015-01-01 DIAGNOSIS — J449 Chronic obstructive pulmonary disease, unspecified: Secondary | ICD-10-CM | POA: Diagnosis not present

## 2015-01-01 DIAGNOSIS — R609 Edema, unspecified: Secondary | ICD-10-CM | POA: Diagnosis not present

## 2015-01-15 DIAGNOSIS — H2513 Age-related nuclear cataract, bilateral: Secondary | ICD-10-CM | POA: Diagnosis not present

## 2015-01-21 DIAGNOSIS — M7989 Other specified soft tissue disorders: Secondary | ICD-10-CM | POA: Diagnosis not present

## 2015-01-28 ENCOUNTER — Ambulatory Visit (INDEPENDENT_AMBULATORY_CARE_PROVIDER_SITE_OTHER): Payer: Medicare Other | Admitting: Pulmonary Disease

## 2015-01-28 ENCOUNTER — Encounter: Payer: Self-pay | Admitting: Pulmonary Disease

## 2015-01-28 VITALS — BP 122/70 | HR 94 | Temp 97.0°F | Ht 60.0 in | Wt 148.0 lb

## 2015-01-28 DIAGNOSIS — J441 Chronic obstructive pulmonary disease with (acute) exacerbation: Secondary | ICD-10-CM

## 2015-01-28 DIAGNOSIS — I6523 Occlusion and stenosis of bilateral carotid arteries: Secondary | ICD-10-CM

## 2015-01-28 DIAGNOSIS — J438 Other emphysema: Secondary | ICD-10-CM

## 2015-01-28 MED ORDER — NYSTATIN 100000 UNIT/ML MT SUSP: 5.0000 mL | OROMUCOSAL | Status: DC | PRN

## 2015-01-28 MED ORDER — PREDNISONE 10 MG PO TABS: ORAL_TABLET | ORAL | Status: DC

## 2015-01-28 NOTE — Progress Notes (Signed)
   Subjective:    Patient ID: Holly [name], female    DOB: 06/20/1946, 69 y.o.   MRN: [id number]  HPI The patient comes in today for an acute sick visit. She has known COPD and unfortunately continues to smoke. She gives a 4 week history of progressive dyspnea, and is having chest tightness as well as wheezing. She has some cough with white mucus production, but does not feel that she has a chest cold. I saw her last month for similar symptoms, and she responded very well to a course of prednisone.   Review of Systems  Constitutional: Negative for fever and unexpected weight change.  HENT: Negative for congestion, dental problem, ear pain, nosebleeds, postnasal drip, rhinorrhea, sinus pressure, sneezing, sore throat and trouble swallowing.   Eyes: Negative for redness and itching.  Respiratory: Positive for cough, chest tightness, shortness of breath and wheezing.   Cardiovascular: Positive for leg swelling. Negative for palpitations.  Gastrointestinal: Negative for nausea and vomiting.  Genitourinary: Negative for dysuria.  Musculoskeletal: Negative for joint swelling.  Skin: Negative for rash.  Neurological: Negative for headaches.  Hematological: Does not bruise/bleed easily.  Psychiatric/Behavioral: Negative for dysphoric mood. The patient is not nervous/anxious.        Objective:   Physical Exam Well-developed female in no acute distress Nose without purulence or discharge noted Neck without lymphadenopathy or thyromegaly Chest with very poor airflow, scattered wheezing and rhonchi noted. Cardiac exam with regular rate and rhythm Lower extremities with pedal edema bilaterally, no cyanosis Alert and oriented, moves all 4 extremities.       Assessment & Plan:

## 2015-01-28 NOTE — Assessment & Plan Note (Signed)
The patient is having another COPD exacerbation related to her ongoing tobacco abuse. I will treat her with a course of prednisone to get her through this episode, but I have stressed to her again that she will continue to get sick until she quits smoking. She is on a good bronchodilator regimen, and there are really no other maintenance medications indicated at this point. She has smoking cessation medications at home, and I will refer her to pulmonary rehabilitation again since she is willing to participate this time around.

## 2015-01-28 NOTE — Patient Instructions (Signed)
Will treat with a course of prednisone to get you thru this episode. Will refer to [facility]hab at [facility]. You MUST stop smoking in order to get better.

## 2015-02-05 ENCOUNTER — Telehealth (HOSPITAL_COMMUNITY): Payer: Self-pay

## 2015-02-09 ENCOUNTER — Encounter (HOSPITAL_COMMUNITY)
Admission: RE | Admit: 2015-02-09 | Discharge: 2015-02-09 | Disposition: A | Discharge status: Home / Self Care | Payer: Medicare Other | Source: Ambulatory Visit | Attending: Pulmonary Disease | Admitting: Pulmonary Disease

## 2015-02-09 ENCOUNTER — Encounter (HOSPITAL_COMMUNITY): Payer: Self-pay

## 2015-02-09 VITALS — BP 130/66 | HR 66 | Resp 18 | Ht 59.5 in | Wt 151.2 lb

## 2015-02-09 DIAGNOSIS — J438 Other emphysema: Secondary | ICD-10-CM

## 2015-02-09 DIAGNOSIS — J441 Chronic obstructive pulmonary disease with (acute) exacerbation: Secondary | ICD-10-CM | POA: Insufficient documentation

## 2015-02-10 ENCOUNTER — Encounter (HOSPITAL_COMMUNITY)
Admission: RE | Admit: 2015-02-10 | Discharge: 2015-02-10 | Disposition: A | Discharge status: Home / Self Care | Payer: Medicare Other | Source: Ambulatory Visit | Attending: Pulmonary Disease | Admitting: Pulmonary Disease

## 2015-02-10 DIAGNOSIS — J441 Chronic obstructive pulmonary disease with (acute) exacerbation: Secondary | ICD-10-CM | POA: Diagnosis not present

## 2015-02-10 NOTE — Progress Notes (Signed)
Holly [name] 69 y.o. female Pulmonary Rehab Orientation Note Patient arrived today in [facility] for orientation to Pulmonary Rehab. She was transported from [employer] via wheel chair. She has not been prescribed oxygen for home use. Color good, skin warm and dry. Patient is oriented to time and place. Patient's medical history and medications reviewed. Heart rate is normal, breath sounds diminished, mild-moderate expiratory wheezing heard diffusely throughout both lungs. Grip strength equal, strong. Distal pulses palpable. Mild pitting edema noted to ankles bilat. Patient reports she does take medications as prescribed. Patient states she follows a Regular diet. The patient reports no specific efforts to gain or lose weight. She has had some unintentional weight gain from the frequent use of steroids. Patient's weight will be monitored closely. Demonstration and practice of PLB using pulse oximeter. Patient able to return demonstration satisfactorily. Patient continues to smoke but knows she needs to quit. She states she has a moderate interest in quitting and was given smoking cessation resources. Safety and hand hygiene in the exercise area reviewed with patient. Patient voices understanding of the information reviewed. Department expectations discussed with patient and achievable goals were set. The patient shows enthusiasm about attending the program and we look forward to working with this nice lady. The patient is scheduled for a 6 min walk test on Tuesday 4/26 at 3:30 and to begin exercise on Thursday 4/28 in the 1330 class.     45 minutes was spent on a variety of activities such as assessment of the patient, obtaining baseline data including height, weight, BMI, and grip strength, verifying medical history, allergies, and current medications, and teaching patient strategies for performing tasks with less respiratory effort with emphasis on pursed lip breathing.

## 2015-02-10 NOTE — Progress Notes (Signed)
Holly [name] completed a Six-Minute Walk Test on 02/10/15 . Holly [name] walked 807 feet with 1 break lasting 50 seconds.  The patient's lowest oxygen saturation was 85 %, highest heart rate was 88 bpm , and highest blood pressure was 128/70. The patient was on room air to begin with and then placed on 2 liters of oxygen with a nasal cannula. Patient stated that leg fatigue hindered their walk test.

## 2015-02-12 ENCOUNTER — Encounter (HOSPITAL_COMMUNITY)
Admission: RE | Admit: 2015-02-12 | Discharge: 2015-02-12 | Disposition: A | Discharge status: Home / Self Care | Payer: Medicare Other | Source: Ambulatory Visit | Attending: Pulmonary Disease | Admitting: Pulmonary Disease

## 2015-02-12 DIAGNOSIS — J441 Chronic obstructive pulmonary disease with (acute) exacerbation: Secondary | ICD-10-CM | POA: Diagnosis not present

## 2015-02-12 NOTE — Progress Notes (Signed)
Today, Holly [name] exercised at [facility]. [facility]. Service time was from 1330 to 1515.  The patient exercised by performing aerobic, strengthening, and stretching exercises. Oxygen saturation, heart rate, blood pressure, rate of perceived exertion, and shortness of breath were all monitored before, during, and after exercise. [name] presented with no problems at today's exercise session.Patient attended the Exercise for Pulmonary patient class today.    The patient did have an increase in workload intensity during today's exercise session.  Pre-exercise vitals: . Weight kg: 68.5 . Liters of O2: 2L . SpO2: 96 . HR: 64 . BP: 100/66 . CBG: NA  Exercise vitals: . Highest heartrate:  83 . Lowest oxygen saturation: 94 . Highest blood pressure: 110/60 . Liters of 02: 2L  Post-exercise vitals: . SpO2: 96 . HR: 60 . BP: 100/56 . Liters of O2: 2L . CBG: NA Dr. Brand Males, Medical Director Dr. Erlinda Hong is immediately available during today's Pulmonary Rehab session for Holly [name] on 02/12/2015  at 1330 class time.  Marland Kitchen

## 2015-02-17 ENCOUNTER — Encounter (HOSPITAL_COMMUNITY)
Admission: RE | Admit: 2015-02-17 | Discharge: 2015-02-17 | Disposition: A | Discharge status: Home / Self Care | Payer: Medicare Other | Source: Ambulatory Visit | Attending: Pulmonary Disease | Admitting: Pulmonary Disease

## 2015-02-17 DIAGNOSIS — J441 Chronic obstructive pulmonary disease with (acute) exacerbation: Secondary | ICD-10-CM | POA: Diagnosis not present

## 2015-02-17 NOTE — Progress Notes (Signed)
Today, [name] exercised at [facility]. [facility]. Service time was from 1:30p to 3:00p.  The patient exercised by performing aerobic, strengthening, and stretching exercises. Oxygen saturation, heart rate, blood pressure, rate of perceived exertion, and shortness of breath were all monitored before, during, and after exercise. [name] presented with no problems at today's exercise session.  The patient did have an increase in workload intensity during today's exercise session.  Pre-exercise vitals: . Weight kg: 68.1 . Liters of O2: 2 . SpO2: 96 . HR: 64 . BP: 116/64 . CBG: na  Exercise vitals: . Highest heartrate:  86 . Lowest oxygen saturation: 95 . Highest blood pressure: 112/62 . Liters of 02: 2  Post-exercise vitals: . SpO2: 96 . HR: 61 . BP: 100/54 . Liters of O2: 2 . CBG: na  Dr. Brand Males, Medical Director Dr. Algis Liming is immediately available during today's Pulmonary Rehab session for Holly [name] on 02/17/15 at 1:30p class time.

## 2015-02-19 ENCOUNTER — Encounter (HOSPITAL_COMMUNITY)
Admission: RE | Admit: 2015-02-19 | Discharge: 2015-02-19 | Disposition: A | Discharge status: Home / Self Care | Payer: Medicare Other | Source: Ambulatory Visit | Attending: Pulmonary Disease | Admitting: Pulmonary Disease

## 2015-02-19 DIAGNOSIS — J441 Chronic obstructive pulmonary disease with (acute) exacerbation: Secondary | ICD-10-CM | POA: Diagnosis not present

## 2015-02-19 NOTE — Progress Notes (Signed)
Today, Holly [name] exercised at [facility] [address]. Service time was from 1330 to 1530.  The patient exercised by performing aerobic, strengthening, and stretching exercises. Oxygen saturation, heart rate, blood pressure, rate of perceived exertion, and shortness of breath were all monitored before, during, and after exercise. [name] presented with no problems at today's exercise session.  Patient attended the Signs and Symptoms class today.    The patient did  have an increase in workload intensity during today's exercise session.  Pre-exercise vitals: . Weight kg: 67.4 . Liters of O2: 2 . SpO2: 96 . HR: 63 . BP: 110/56 . CBG: na  Exercise vitals: . Highest heartrate:  96 . Lowest oxygen saturation: 95 . Highest blood pressure: 122/70 .  Marland Kitchen Liters of 02: 2  Post-exercise vitals: . SpO2: 97 . HR: 62 . BP: 120/70 . Liters of O2: 2 . CBG: na Dr. Brand Males, Medical Director Dr. Algis Liming is immediately available during today's Pulmonary Rehab session for Holly [name] on 02/19/2015  at 1330 class time.  Marland Kitchen

## 2015-02-23 ENCOUNTER — Telehealth: Payer: Self-pay | Admitting: Internal Medicine

## 2015-02-23 ENCOUNTER — Ambulatory Visit (INDEPENDENT_AMBULATORY_CARE_PROVIDER_SITE_OTHER)
Admission: RE | Admit: 2015-02-23 | Discharge: 2015-02-23 | Disposition: A | Discharge status: Home / Self Care | Payer: Medicare Other | Source: Ambulatory Visit | Attending: Internal Medicine | Admitting: Internal Medicine

## 2015-02-23 ENCOUNTER — Telehealth: Payer: Self-pay | Admitting: Pulmonary Disease

## 2015-02-23 ENCOUNTER — Encounter: Payer: Self-pay | Admitting: Internal Medicine

## 2015-02-23 ENCOUNTER — Ambulatory Visit (INDEPENDENT_AMBULATORY_CARE_PROVIDER_SITE_OTHER): Payer: Medicare Other | Admitting: Internal Medicine

## 2015-02-23 VITALS — BP 132/68 | HR 68 | Ht 59.0 in | Wt 150.6 lb

## 2015-02-23 DIAGNOSIS — J45909 Unspecified asthma, uncomplicated: Secondary | ICD-10-CM | POA: Diagnosis not present

## 2015-02-23 DIAGNOSIS — J449 Chronic obstructive pulmonary disease, unspecified: Secondary | ICD-10-CM

## 2015-02-23 DIAGNOSIS — F17210 Nicotine dependence, cigarettes, uncomplicated: Secondary | ICD-10-CM | POA: Diagnosis not present

## 2015-02-23 DIAGNOSIS — I6523 Occlusion and stenosis of bilateral carotid arteries: Secondary | ICD-10-CM | POA: Diagnosis not present

## 2015-02-23 DIAGNOSIS — R05 Cough: Secondary | ICD-10-CM | POA: Diagnosis not present

## 2015-02-23 IMAGING — CR DG CHEST 2V
2 series · 2 of 2 positions shown · non-contrast
Comparison: [DATE]

CLINICAL DATA: Fell on [REDACTED], lt anterior/lat flank pain/bruise,
cough, sob. Smoker. COPD,asthma, HTN.

EXAM:
CHEST  2 VIEW

[view not recorded (1 of 2)]
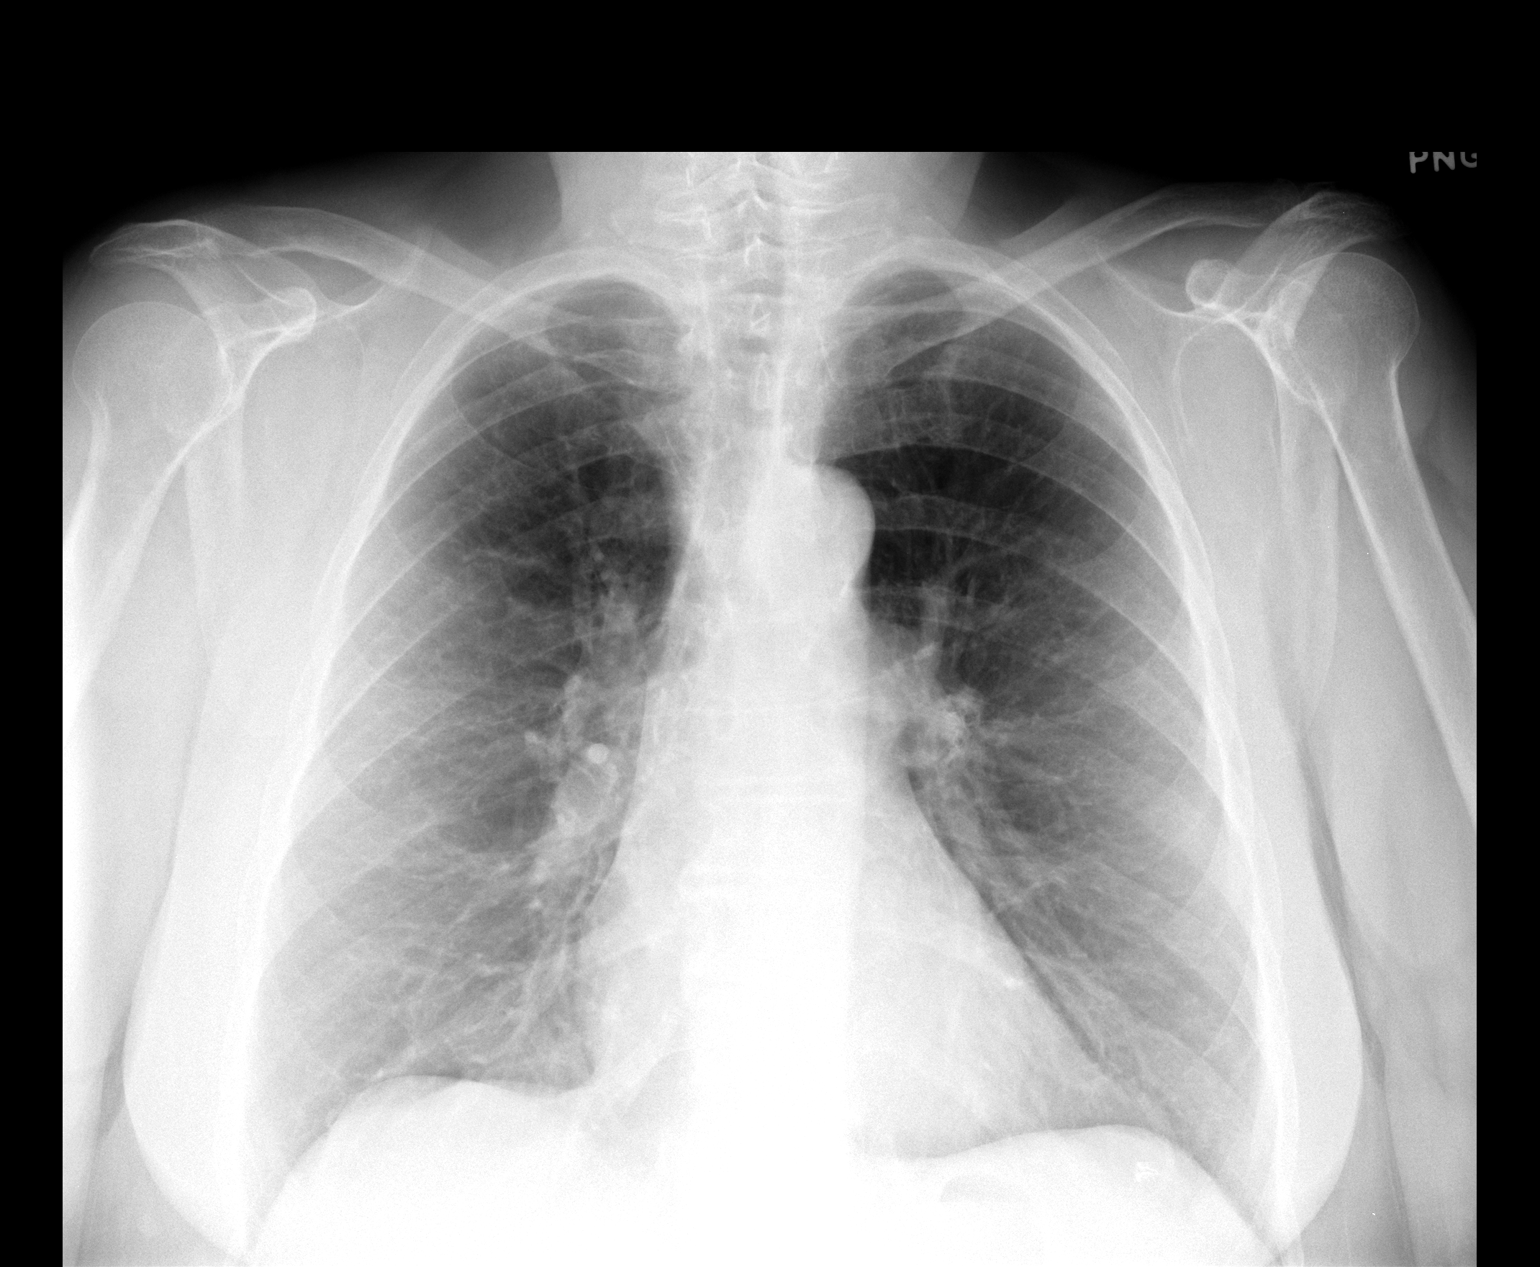

[view not recorded (2 of 2)]
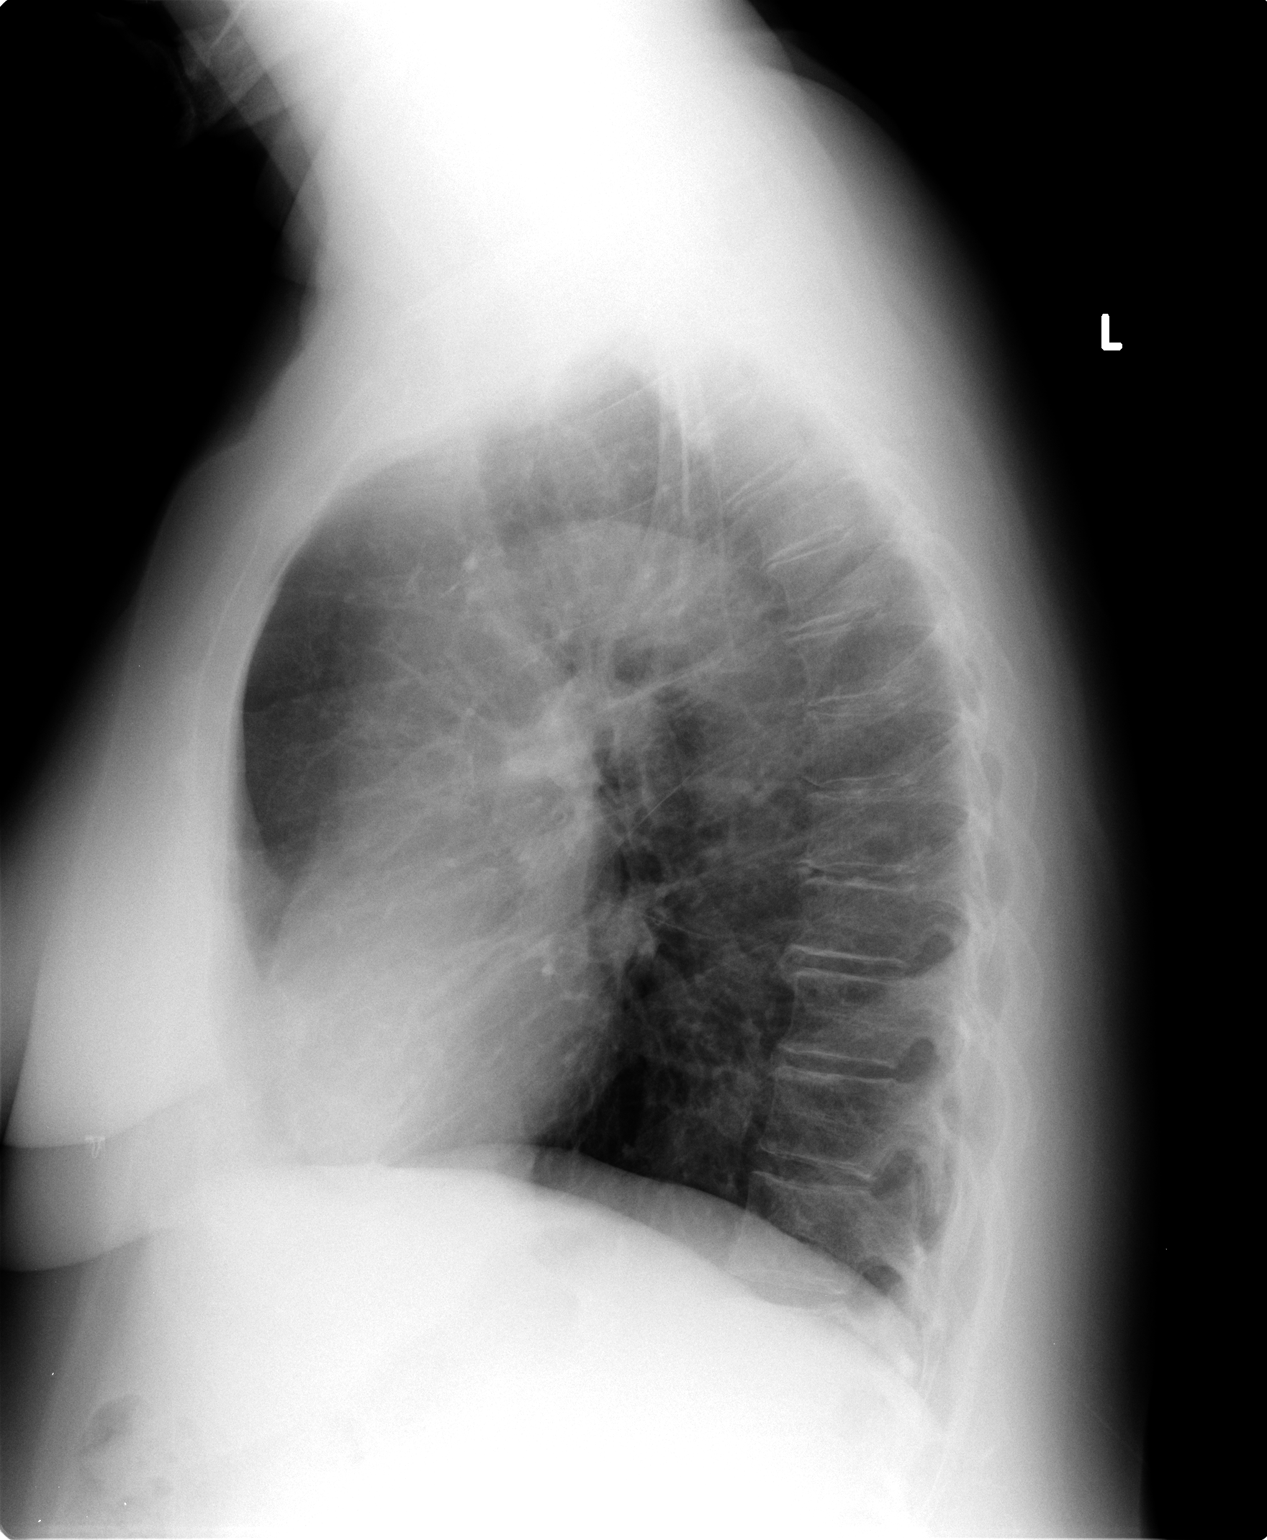

[2 of 2 positions shown; findings below may reference images not displayed]

FINDINGS: Heart, mediastinum and hila are unremarkable. Lungs are
hyperexpanded but clear. No pleural effusion or pneumothorax.

Bony thorax is intact.  No rib fracture is seen.
IMPRESSION: 1. No acute cardiopulmonary disease.
2. COPD.

## 2015-02-23 MED ORDER — PREDNISONE 10 MG PO TABS: ORAL_TABLET | ORAL | Status: DC

## 2015-02-23 NOTE — Telephone Encounter (Signed)
Spoke with the pt and notified of results/recs per MW  She verbalized understanding and nothing further needed

## 2015-02-23 NOTE — Progress Notes (Signed)
Quick Note:  LMTCB ______ 

## 2015-02-23 NOTE — Progress Notes (Signed)
Subjective:     Patient ID: Holly [name], female   DOB: [birth year]   MRN: [id number]    Brief patient profile:   75 yowf active smoker with dx of copd around 2005 last seen by K Clance 06/20/14 with "her usual cough / doe " maint symb/spiriva/ saba q 3-4 days    History of Present Illness  10/13/2014 acute  office visit/ Wert re? aecopd   Chief Complaint  Patient presents with  . Acute Visit    [place] pt here for prod cough with white-yellow mucus, pnd, increased sob X1 month.    much worse cough since late Oct 25th 2015 on acei noted onset cough  immediately p thyroid surgery and persistent dailly since then assoc with worse sob than usual and increase freq of saba use but not helping, much worse day than noct/ assoc with mod hoarseness rec Prednisone 10 mg take  4 each am x 2 days,   2 each am x 2 days,  1 each am x 2 days and stop  Stop lotensin (benazapril) and cozar (losartan) and start benicar 40/25 one half day  Continue bisoprolol as you are as bystolic is very expensive Work on inhaler technique:   Try off spiriva just to see if it makes any difference (think of it high octane fuel)  For cough > deslym two tsp every 12 hours as needed   The key is to stop smoking completely before smoking completely stops you!    10/28/2014 f/u ov/Wert re: copd/ still smoking/ confused with maint vs prns /off acei x 2 weeks Chief Complaint  Patient presents with  . Follow-up    Pt states that her breathing "is the same, maybe a little worse" since the last visit. Her cough has improved some. She is using rescue inhaler on average 3 x per day.  still sleeping on couch R side down can't sleep on back unless propped way  up x years  Mucus is slt tan mucinex dm with saba noct also avg 4 x in 24 h   Using lots of mints  Doe x [employer], leans on cart, uses  [facility] parking   >>PPI /Pepcid   11/11/2014 Follow up and Med Review  Patient returns for a two-week follow-up and medication review. We  reviewed all her medications and organized them into a medication calendar with patient education Patient has had a slow to resolve COPD exacerbation. Over the last month She was treated with antibiotics and a steroid taper. Her ACE inhibitor was stopped. Says that she's had minimal improvement in symptoms. Continues to get short of breath with walking. Has cough with white mucus. He was also instructed to hold her Spiriva due to possible upper airway irritation from dry powder She has not seen much improvement in her cough and wheezing. Chest x-ray showed no acute findings Continues to smoke 1 PPD . Discussed smoking cessation Spirometry today shows FEV1 at 22%  FVC 41%  Ratio 41 Previous FEV1 in 2011 was at 47%. Walk in office showed O2 sats drop to 87% on RA , adequate on 2l/m  We discussed O2 -she declines at present time   11/24/2014 Acute OV  Complains of productive cough, congestion with thick yellow mucus for 3 days .  Wheezing worse for last week.  Taking mucinex without much help.  We discussed smoking cessation in long detail.  rec Restart Spiriva Handihaler 1 puff daily  Continue on Symbicort 2 puffs Twice daily  ,  rinse after use.  Mucinex DM Twice daily  As needed  Cough/congestion .  Follow Med calendar closely and bring to each visit .    02/23/2015 acute  ov/Wert re: GOLD III copd/ still smoking ? Acute exac  Chief Complaint  Patient presents with  . Acute Visit    Pt c/o of increased SOB, dry cough. Has left sided abdominal pain due to fall on  02/20/15. Denies any chest congestion/tightness. Uses albuterol 3-4 times daily.   transiently improves p prednisone then gradually worse with suboptimal  hfa (see a/p) on symbicort/ spiriva  No obvious day to day or daytime variabilty or assoc purulent or excess sputum or cp or chest tightness, subjective wheeze overt sinus or hb symptoms. No unusual exp hx or h/o childhood pna/ asthma or knowledge of premature birth.  Sleeping  ok without nocturnal  or early am exacerbation  of respiratory  c/o's or need for noct saba. Also denies any obvious fluctuation of symptoms with weather or environmental changes or other aggravating or alleviating factors except as outlined above   Current Medications, Allergies, Complete Past Medical History, Past Surgical History, Family History, and Social History were reviewed in [employer] record.  ROS  The following are not active complaints unless bolded sore throat, dysphagia, dental problems, itching, sneezing,  nasal congestion or excess/ purulent secretions, ear ache,   fever, chills, sweats, unintended wt loss, pleuritic or exertional cp, hemoptysis,  orthopnea pnd or leg swelling, presyncope, palpitations, heartburn, abdominal pain, anorexia, nausea, vomiting, diarrhea  or change in bowel or urinary habits, change in stools or urine, dysuria,hematuria,  rash, arthralgias, visual complaints, headache, numbness weakness or ataxia or problems with walking or coordination,  change in mood/affect or memory.                   Objective:   Physical Exam    amb wf   Moderately  Hoarse/ hopeless affect   Wt Readings from Last 3 Encounters:  02/23/15 150 lb 9.6 oz (68.312 kg)  01/28/15 148 lb (67.132 kg)  12/23/14 148 lb (67.132 kg)    Vital signs reviewed     HEENT mild turbinate edema.  Oropharynx no thrush or excess pnd or cobblestoning.  No JVD or cervical adenopathy. Mild accessory muscle hypertrophy. Trachea midline, nl thryroid. Chest with few bilateral mid exp  rhonchi, and tr wheeze . Regular rate and rhythm without murmur gallop or rub or increase P2 or edema.  Abd: no hsm, nl excursion. Ext warm without cyanosis or clubbing.  Bruising below L costal margin, no splenomegaly noted      I personally reviewed images and agree with radiology impression as follows:  CXR:  02/23/15 Heart, mediastinum and hila are unremarkable. Lungs are hyperexpanded  but clear. No pleural effusion or pneumothorax.  Bony thorax is intact. No rib fracture is seen.        Assessment:

## 2015-02-23 NOTE — Patient Instructions (Addendum)
Plan A = symbicort 160 Take 2 puffs first thing in am and then another 2 puffs about 12 hours later.                 spiriva right after symbicort each am   Plan B = backup Only use your albuterol (proair) as a rescue medication to be used if you can't catch your breath by resting or doing a relaxed purse lip breathing pattern.  - The less you use it, the better it will work when you need it. - Ok to use up to 2 puffs  every 4 hours if you must but call for immediate appointment if use goes up over your usual need - Don't leave home without it !!  (think of it like the spare tire for your car)   Work on inhaler technique:  relax and gently blow all the way out then take a nice smooth deep breath back in, triggering the inhaler at same time you start breathing in.  Hold for up to 5 seconds if you can.  Rinse and gargle with water when done  Prednisone 10 mg take  4 each am x 2 days,   2 each am x 2 days,  1 each am x 2 days and stop   Please remember to go to the  x-ray department downstairs for your tests - we will call you with the results when they are available.  Use mucinex dm up to 1200 mg every 12 hours as needed and use the flutter valve as much as possible  The key is to stop smoking completely before smoking completely stops you!   As long as coughing > Try prilosec '20mg'$   Take 30-60 min before first meal of the day and Pepcid 20 mg one bedtime until cough is completely gone for at least a week without the need for cough suppression  See Dr Gwenette Greet in 2 weeks if not better

## 2015-02-23 NOTE — Progress Notes (Signed)
Quick Note:  Spoke with pt and notified of results per Dr. Wert. Pt verbalized understanding and denied any questions.  ______ 

## 2015-02-23 NOTE — Telephone Encounter (Signed)
Spoke with pt. OV has been scheduled with MW today at 12pm. Nothing further was needed.

## 2015-02-24 ENCOUNTER — Encounter (HOSPITAL_COMMUNITY)
Admission: RE | Admit: 2015-02-24 | Discharge: 2015-02-24 | Disposition: A | Discharge status: Home / Self Care | Payer: Medicare Other | Source: Ambulatory Visit | Attending: Pulmonary Disease | Admitting: Pulmonary Disease

## 2015-02-24 DIAGNOSIS — J441 Chronic obstructive pulmonary disease with (acute) exacerbation: Secondary | ICD-10-CM | POA: Diagnosis not present

## 2015-02-24 NOTE — Progress Notes (Signed)
Today, Holly [name] exercised at [facility]. [facility]. Service time was from 1330 to 1530.  The patient exercised by performing aerobic, strengthening, and stretching exercises. Oxygen saturation, heart rate, blood pressure, rate of perceived exertion, and shortness of breath were all monitored before, during, and after exercise. Holly [name] presented with no problems at today's exercise session.  Patient attended the Anatomy and Physiology class today.    The patient did not have an increase in workload intensity during today's exercise session.  Pre-exercise vitals: . Weight kg: 67.8 . Liters of O2: ra . SpO2: 91 . HR: 64 . BP: 124/70 . CBG: na  Exercise vitals: . Highest heartrate:  67 . Lowest oxygen saturation: 94 . Highest blood pressure: 140/54 . Liters of 02: 2  Post-exercise vitals: . SpO2: 91 . HR: 64 . BP: 130/80 . Liters of O2: 2 . CBG: na Dr. Brand Males, Medical Director Dr. Algis Liming is immediately available during today's Pulmonary Rehab session for Holly [name] on 02/24/2015  at 1330 class time.  Marland Kitchen

## 2015-02-24 NOTE — Assessment & Plan Note (Signed)

## 2015-02-24 NOTE — Assessment & Plan Note (Signed)
PFT's 2011:  FEV1 0.84 (47%), ratio 43, +airtrapping, DLCO 52%. Counseled on smoking cessation Referred to pulmonary rehab.   The proper method of use, as well as anticipated side effects, of a metered-dose inhaler are discussed and demonstrated to the patient. Improved effectiveness after extensive coaching during this visit to a level of approximately  75% from a baseline of 50%   I had an extended discussion with the patient reviewing all relevant studies completed to date and  lasting 15 to 20 minutes of a 25 minute visit on the following ongoing concerns:  1) no evidence of lung or cw injury related to falling but cough will definitely hurt due to need to use abd muscles with bruising noted  2) declined anything stronger than tylenol for pain  3) encourage to use flutter for cough and mucinex dm  4) Each maintenance medication was reviewed in detail including most importantly the difference between maintenance and as needed and under what circumstances the prns are to be used.  Please see instructions for details which were reviewed in writing and the patient given a copy.

## 2015-02-26 ENCOUNTER — Encounter (HOSPITAL_COMMUNITY): Payer: Medicare Other

## 2015-03-03 ENCOUNTER — Encounter (HOSPITAL_COMMUNITY)
Admission: RE | Admit: 2015-03-03 | Discharge: 2015-03-03 | Disposition: A | Discharge status: Home / Self Care | Payer: Medicare Other | Source: Ambulatory Visit | Attending: Pulmonary Disease | Admitting: Pulmonary Disease

## 2015-03-03 DIAGNOSIS — J441 Chronic obstructive pulmonary disease with (acute) exacerbation: Secondary | ICD-10-CM | POA: Diagnosis not present

## 2015-03-03 NOTE — Progress Notes (Signed)
Today, Holly [name] exercised at [facility]. [facility]. Service time was from 1330 to 1500.  The patient exercised by performing aerobic, strengthening, and stretching exercises. Oxygen saturation, heart rate, blood pressure, rate of perceived exertion, and shortness of breath were all monitored before, during, and after exercise. Holly [name] presented with no problems at today's exercise session.  The patient did have an increase in workload intensity during today's exercise session.  Pre-exercise vitals: . Weight kg: 69.1 . Liters of O2: ra . SpO2: 94 . HR: 59 . BP: 102/60 . CBG: na  Exercise vitals: . Highest heartrate:  67 . Lowest oxygen saturation: 94 . Highest blood pressure: 130/62 . Liters of 02: 2L  Post-exercise vitals: . SpO2: 97 . HR: 55 . BP: 112/60 . Liters of O2: 2L . CBG: na  Dr. Brand Males, Medical Director Dr. Wendee Beavers is immediately available during today's Pulmonary Rehab session for Holly [name] on 03/03/2015 at 1330 class time.

## 2015-03-05 ENCOUNTER — Encounter (HOSPITAL_COMMUNITY)
Admission: RE | Admit: 2015-03-05 | Discharge: 2015-03-05 | Disposition: A | Discharge status: Home / Self Care | Payer: Medicare Other | Source: Ambulatory Visit | Attending: Pulmonary Disease | Admitting: Pulmonary Disease

## 2015-03-05 DIAGNOSIS — J441 Chronic obstructive pulmonary disease with (acute) exacerbation: Secondary | ICD-10-CM | POA: Diagnosis not present

## 2015-03-05 NOTE — Progress Notes (Signed)
Today, Holly [name] exercised at [facility]. [facility]. Service time was from 1330 to 1530.  The patient exercised by performing aerobic, strengthening, and stretching exercises. Oxygen saturation, heart rate, blood pressure, rate of perceived exertion, and shortness of breath were all monitored before, during, and after exercise. Holly [name] presented with no problems at today's exercise session. Patient attended the stress and energy conservation class today.     The patient did  have an increase in workload intensity during today's exercise session.  Pre-exercise vitals: . Weight kg: 68.0 . Liters of O2: ra . SpO2: 94 . HR: 57 . BP: 104/60 . CBG: na  Exercise vitals: . Highest heartrate:  62 . Lowest oxygen saturation: 98 . Highest blood pressure: 130/80 . Liters of 02: 2  Post-exercise vitals: . SpO2: 92 . HR: 58 . BP: 110/60 . Liters of O2: ra . CBG: na Dr. Brand Males, Medical Director Dr. Ree Kida is immediately available during today's Pulmonary Rehab session for Holly [name] on 03/05/2015  at 1330 class time.  Marland Kitchen

## 2015-03-10 ENCOUNTER — Encounter (HOSPITAL_COMMUNITY)
Admission: RE | Admit: 2015-03-10 | Discharge: 2015-03-10 | Disposition: A | Discharge status: Home / Self Care | Payer: Medicare Other | Source: Ambulatory Visit | Attending: Pulmonary Disease | Admitting: Pulmonary Disease

## 2015-03-10 DIAGNOSIS — J441 Chronic obstructive pulmonary disease with (acute) exacerbation: Secondary | ICD-10-CM | POA: Diagnosis not present

## 2015-03-10 NOTE — Progress Notes (Signed)
Today, Holly [name] exercised at [facility]. [facility]. Service time was from 1330 to 1515.  The patient exercised by performing aerobic, strengthening, and stretching exercises. Oxygen saturation, heart rate, blood pressure, rate of perceived exertion, and shortness of breath were all monitored before, during, and after exercise. Sean presented with no problems at today's exercise session.  The patient did not have an increase in workload intensity during today's exercise session.  Pre-exercise vitals: . Weight kg: 69.2 . Liters of O2: 91 . SpO2: RA . HR: 59 . BP: 100/52 . CBG: NA  Exercise vitals: . Highest heartrate:  59 . Lowest oxygen saturation: 97 . Highest blood pressure: 130/56 . Liters of 02: 2  Post-exercise vitals: . SpO2: 95 . HR: 55 . BP: 114/68 . Liters of O2: RA . CBG: NA Dr. Brand Males, Medical Director Dr. Ree Kida is immediately available during today's Pulmonary Rehab session for Holly [name] on 03/10/2015  at 1330 class time.  Marland Kitchen

## 2015-03-10 NOTE — Progress Notes (Signed)
Holly [name] 69 y.o. female Nutrition Note Spoke with pt. Pt is obese. There are many ways the pt can make her eating habits healthier. Pt's Rate Your Plate results reviewed with pt. Per discussion with pt, pt quit using tobacco 02/24/15. Pt reports doing well with tobacco cessation "but I still use the puffer (fake cigarette)." Pt would like to lose wt, but is not currently focusing on wt loss. Pt does not avoid salty food; uses convenience food and eats out frequently. Pt adds salt to food. The role of sodium in lung disease reviewed with pt. Pt expressed understanding of the information reviewed. Nutrition Diagnosis ? Excessive sodium intake related to over consumption of processed food as evidenced by frequent consumption of convenience food  and eating out frequently. ? Food-and nutrition-related knowledge deficit related to lack of exposure to information as related to diagnosis of pulmonary disease ?  Nutrition Rx/Est. Daily Nutrition Needs for: ? wt loss 1200-1300 Kcal  55-65 gm protein   1500 mg or less sodium Nutrition Rx/Est. Daily Nutrition Needs for: ? wt maintenance 1600-1800 Kcal  55-65 gm protein   1500 mg or less sodium  Nutrition Intervention ? Pt's individual nutrition plan and goals reviewed with pt. ? Benefits of adopting healthy eating habits discussed when pt's Rate Your Plate reviewed. ? Pt to attend the Nutrition and Lung Disease class ? Handouts given for Healthy Choices when Dining Out and Nutrition II class  ? Continual client-centered nutrition education by RD, as part of interdisciplinary care. Goal(s) 1. Pt to identify and limit food sources of sodium. 2. Describe the benefit of including fruits, vegetables, whole grains, and low-fat dairy products in a healthy meal plan. Monitor and Evaluate progress toward nutrition goal with team.   Derek Mound, M.Ed, RD, LDN, CDE 03/10/2015 2:38 PM

## 2015-03-12 ENCOUNTER — Encounter (HOSPITAL_COMMUNITY)
Admission: RE | Admit: 2015-03-12 | Discharge: 2015-03-12 | Disposition: A | Discharge status: Home / Self Care | Payer: Medicare Other | Source: Ambulatory Visit | Attending: Pulmonary Disease | Admitting: Pulmonary Disease

## 2015-03-12 DIAGNOSIS — J441 Chronic obstructive pulmonary disease with (acute) exacerbation: Secondary | ICD-10-CM | POA: Diagnosis not present

## 2015-03-12 NOTE — Progress Notes (Signed)
Today, [name] exercised at [facility]. [facility]. Service time was from 1330 to 1530.  The patient exercised by performing aerobic, strengthening, and stretching exercises. Oxygen saturation, heart rate, blood pressure, rate of perceived exertion, and shortness of breath were all monitored before, during, and after exercise. Holly [name] presented with no problems at today's exercise session. She attended Advanced Directives class today.  The patient did not have an increase in workload intensity during today's exercise session.  Pre-exercise vitals: . Weight kg: 69.3 . Liters of O2: 2 . SpO2: 97 . HR: 88 . BP: 102/60 . CBG: NA  Exercise vitals: . Highest heartrate:  68 . Lowest oxygen saturation: 98 . Highest blood pressure: 122/70 . Liters of 02: 2  Post-exercise vitals: . SpO2: 97 . HR: 58 . BP: 106/54 . Liters of O2: 2 . CBG: NA Dr. Brand Males, Medical Director Dr. Verlon Au is immediately available during today's Pulmonary Rehab session for Holly [name] on 03/12/2015  at 1330 class time.  Marland Kitchen

## 2015-03-17 ENCOUNTER — Encounter (HOSPITAL_COMMUNITY)
Admission: RE | Admit: 2015-03-17 | Discharge: 2015-03-17 | Disposition: A | Discharge status: Home / Self Care | Payer: Medicare Other | Source: Ambulatory Visit | Attending: Pulmonary Disease | Admitting: Pulmonary Disease

## 2015-03-17 DIAGNOSIS — J441 Chronic obstructive pulmonary disease with (acute) exacerbation: Secondary | ICD-10-CM | POA: Diagnosis not present

## 2015-03-17 NOTE — Progress Notes (Signed)
I have reviewed a Home Exercise Prescription with Holly [name] . Holly [name] is not currently exercising at home.  The patient was advised to walk 2-3 days a week for 30 minutes.  Holly [name] and I discussed how to progress their exercise prescription.  The patient stated that their goals were to not be so short of breath during activities of daily living.  The patient stated that they understand the exercise prescription.  We reviewed exercise guidelines, target heart rate during exercise, oxygen use, weather, home pulse oximeter, endpoints for exercise, and goals.  Patient is encouraged to come to me with any questions. I will continue to follow up with the patient to assist them with progression and safety.

## 2015-03-17 NOTE — Progress Notes (Addendum)
Today, Holly [name] exercised at [facility]. Service time was from 1300 to 1500.  The patient exercised by performing aerobic, strengthening, and stretching exercises. Oxygen saturation, heart rate, blood pressure, rate of perceived exertion, and shortness of breath were all monitored before, during, and after exercise. Holly [name] presented with no problems at today's exercise session. Pt exercised on room air today. Her lowest room air saturation was 89% on the walking track. She had only mild SOB with exercise and was able to tolerate room air exercise well. She has been tobacco free for 21 days and has used her rescue inhaler twice since starting the pulmonary rehab program.  The patient did not have an increase in workload intensity during today's exercise session.  Pre-exercise vitals: . Weight kg: 69.7 . Liters of O2: RA . SpO2: 92 . HR: 61 . BP: 120/70 . CBG: NA  Exercise vitals: . Highest heartrate:  71 . Lowest oxygen saturation: 89 . Highest blood pressure: 100/64 . Liters of 02: RA  Post-exercise vitals: . SpO2: 94 . HR: 58 . BP: 100/60 . Liters of O2: RA . CBG: NA Dr. Brand Males, Medical Director Dr. Verlon Au is immediately available during today's Pulmonary Rehab session for Holly [name] on 03/17/2015  at 1330 class time.

## 2015-03-19 ENCOUNTER — Encounter (HOSPITAL_COMMUNITY)
Admission: RE | Admit: 2015-03-19 | Discharge: 2015-03-19 | Disposition: A | Discharge status: Home / Self Care | Payer: Medicare Other | Source: Ambulatory Visit | Attending: Pulmonary Disease | Admitting: Pulmonary Disease

## 2015-03-19 DIAGNOSIS — J441 Chronic obstructive pulmonary disease with (acute) exacerbation: Secondary | ICD-10-CM | POA: Insufficient documentation

## 2015-03-19 NOTE — Progress Notes (Signed)
Today, Holly [name] exercised at [facility]. [facility]. Service time was from 1330 to 1545 .  The patient exercised by performing aerobic, strengthening, and stretching exercises. Oxygen saturation, heart rate, blood pressure, rate of perceived exertion, and shortness of breath were all monitored before, during, and after exercise. Holly [name] presented with no problems at today's exercise session. She attended nutrition class today.  The patient did not have an increase in workload intensity during today's exercise session.  Pre-exercise vitals: . Weight kg: 69.4 . Liters of O2: RA . SpO2: 94 . HR: 62 . BP: 110/70 . CBG: NA  Exercise vitals: . Highest heartrate:  68 . Lowest oxygen saturation: 93 . Highest blood pressure: 120/58 . Liters of 02: RA  Post-exercise vitals: . SpO2: 95 . HR: 72 . BP: 118/70 . Liters of O2: RA . CBG: NA Dr. Brand Males, Medical Director Dr. Grandville Silos is immediately available during today's Pulmonary Rehab session for Holly [name] on 03/19/2015  at 1330 class time.  Marland Kitchen

## 2015-03-20 ENCOUNTER — Ambulatory Visit (INDEPENDENT_AMBULATORY_CARE_PROVIDER_SITE_OTHER): Payer: Medicare Other | Admitting: Pulmonary Disease

## 2015-03-20 ENCOUNTER — Encounter: Payer: Self-pay | Admitting: Pulmonary Disease

## 2015-03-20 VITALS — BP 132/76 | HR 63 | Temp 98.7°F | Ht 60.0 in | Wt 153.0 lb

## 2015-03-20 DIAGNOSIS — J438 Other emphysema: Secondary | ICD-10-CM | POA: Diagnosis not present

## 2015-03-20 DIAGNOSIS — I6523 Occlusion and stenosis of bilateral carotid arteries: Secondary | ICD-10-CM | POA: Diagnosis not present

## 2015-03-20 NOTE — Patient Instructions (Signed)
Stay on symbicort, and will give you samples of spiriva respimat, 2 inhalations each am.  If you like this better than the handihaler, let us know and we can send in prescription.  Continue to stay off cigarettes.  You are doing fantastic. Stay in pulmonary rehab followup with Dr. Lake Bells in 75mo

## 2015-03-20 NOTE — Assessment & Plan Note (Signed)
The patient is doing much better after her recent acute exacerbation. She has been off cigarettes for 28 days, is working hard in pulmonary rehabilitation, and is staying on her maintenance bronchodilator regimen. Her cough has almost completely resolved, as well as her mucus production. I've asked her to continue on her bronchodilator regimen, and staying in pulmonary rehabilitation as well.

## 2015-03-20 NOTE — Progress Notes (Signed)
   Subjective:    Patient ID: Holly [name], female    DOB: 11/26/1945, 69 y.o.   MRN: [id number]  HPI The patient comes in today for follow-up of her known COPD. She has a history of recurrent acute exacerbations related to her ongoing smoking, and was recently seen in the office requiring a prednisone taper and antibiotics. Since that time, she is totally quit smoking, and is working hard in pulmonary rehabilitation. She has seen a tremendous difference in her breathing and airway symptoms, and has been able to increase her exertional tolerance.   Review of Systems  Constitutional: Negative for fever and unexpected weight change.  HENT: Negative for congestion, dental problem, ear pain, nosebleeds, postnasal drip, rhinorrhea, sinus pressure, sneezing, sore throat and trouble swallowing.   Eyes: Negative for redness and itching.  Respiratory: Negative for cough, chest tightness, shortness of breath and wheezing.   Cardiovascular: Negative for palpitations and leg swelling.  Gastrointestinal: Negative for nausea and vomiting.  Genitourinary: Negative for dysuria.  Musculoskeletal: Negative for joint swelling.  Skin: Negative for rash.  Neurological: Negative for headaches.  Hematological: Does not bruise/bleed easily.  Psychiatric/Behavioral: Negative for dysphoric mood. The patient is not nervous/anxious.        Objective:   Physical Exam Overweight female in no acute distress Nose without purulence or discharge noted Neck without lymphadenopathy or thyromegaly Chest with decreased breath sounds, no active wheezing Cardiac exam with regular rate and rhythm Lower extremities with mild edema, no cyanosis Alert and oriented, moves all 4 extremities.       Assessment & Plan:

## 2015-03-24 ENCOUNTER — Encounter (HOSPITAL_COMMUNITY)
Admission: RE | Admit: 2015-03-24 | Discharge: 2015-03-24 | Disposition: A | Discharge status: Home / Self Care | Payer: Medicare Other | Source: Ambulatory Visit | Attending: Pulmonary Disease | Admitting: Pulmonary Disease

## 2015-03-24 DIAGNOSIS — J441 Chronic obstructive pulmonary disease with (acute) exacerbation: Secondary | ICD-10-CM | POA: Diagnosis not present

## 2015-03-24 NOTE — Progress Notes (Signed)
Today, Holly [name] exercised at [facility]. [facility]. Service time was from 1330- to 1500.  The patient exercised by performing aerobic, strengthening, and stretching exercises. Oxygen saturation, heart rate, blood pressure, rate of perceived exertion, and shortness of breath were all monitored before, during, and after exercise. Holly [name] presented with no problems at today's exercise session.  The patient did  have an increase in workload intensity during today's exercise session.  Pre-exercise vitals: . Weight kg: 70.3 . Liters of O2: RA . SpO2: 92 . HR: 63 . BP: 110/56 . CBG: NA  Exercise vitals: . Highest heartrate:  74 . Lowest oxygen saturation: 92 . Highest blood pressure: 130/70 . Liters of 02: RA  Post-exercise vitals: . SpO2: 92 . HR: 62 . BP: 114/62 . Liters of O2: RA . CBG: NA Dr. Brand Males, Medical Director Dr. Grandville Silos is immediately available during today's Pulmonary Rehab session for Holly [name] on 03/24/2015  at 1330 class time.  Marland Kitchen

## 2015-03-26 ENCOUNTER — Encounter (HOSPITAL_COMMUNITY): Payer: Medicare Other

## 2015-03-31 ENCOUNTER — Encounter (HOSPITAL_COMMUNITY)
Admission: RE | Admit: 2015-03-31 | Discharge: 2015-03-31 | Disposition: A | Discharge status: Home / Self Care | Payer: Medicare Other | Source: Ambulatory Visit | Attending: Pulmonary Disease | Admitting: Pulmonary Disease

## 2015-03-31 DIAGNOSIS — J441 Chronic obstructive pulmonary disease with (acute) exacerbation: Secondary | ICD-10-CM | POA: Diagnosis not present

## 2015-03-31 NOTE — Progress Notes (Signed)
Today, Teisha exercised at [facility]. [facility]. Service time was from 1330 to 1515.  The patient exercised by performing aerobic, strengthening, and stretching exercises. Oxygen saturation, heart rate, blood pressure, rate of perceived exertion, and shortness of breath were all monitored before, during, and after exercise. Bonny presented with no problems at today's exercise session.  The patient did not have an increase in workload intensity during today's exercise session.  Pre-exercise vitals: . Weight kg: 70.3 . Liters of O2: ra . SpO2: 93 . HR: 61 . BP: 110/62 . CBG: na  Exercise vitals: . Highest heartrate:  74 . Lowest oxygen saturation: 90 . Highest blood pressure: 132/62 . Liters of 02: ra  Post-exercise vitals: . SpO2: 92 . HR: 60 . BP: 100/66 . Liters of O2: ra . CBG: na  Dr. Brand Males, Medical Director Dr. Hartford Poli is immediately available during today's Pulmonary Rehab session for Kalynn N Stockard on 03/31/2015 at 1330 class time.

## 2015-04-02 ENCOUNTER — Encounter (HOSPITAL_COMMUNITY): Payer: Medicare Other

## 2015-04-07 ENCOUNTER — Encounter (HOSPITAL_COMMUNITY)
Admission: RE | Admit: 2015-04-07 | Discharge: 2015-04-07 | Disposition: A | Discharge status: Home / Self Care | Payer: Medicare Other | Source: Ambulatory Visit | Attending: Pulmonary Disease | Admitting: Pulmonary Disease

## 2015-04-07 DIAGNOSIS — J441 Chronic obstructive pulmonary disease with (acute) exacerbation: Secondary | ICD-10-CM | POA: Diagnosis not present

## 2015-04-07 NOTE — Progress Notes (Signed)
Today, Holly [name] exercised at [facility]. [facility]. Service time was from 1330 to 1500.  The patient exercised by performing aerobic, strengthening, and stretching exercises. Oxygen saturation, heart rate, blood pressure, rate of perceived exertion, and shortness of breath were all monitored before, during, and after exercise. Holly [name] presented with no problems at today's exercise session.  The patient did not have an increase in workload intensity during today's exercise session.  Pre-exercise vitals: . Weight kg: 70.2 . Liters of O2: RA . SpO2: 95 . HR: 59 . BP: 130/74 . CBG: NA  Exercise vitals: . Highest heartrate:  72 . Lowest oxygen saturation: 91 . Highest blood pressure: 122/60 . Liters of 02: RA  Post-exercise vitals: . SpO2: 92 . HR: 55 . BP: 102/60 . Liters of O2: RA . CBG: NA Dr. Brand Males, Medical Director Dr. Tana Coast is immediately available during today's Pulmonary Rehab session for Catha N Huntley on 04/07/2015  at 1330 class time.  Marland Kitchen

## 2015-04-09 ENCOUNTER — Encounter (HOSPITAL_COMMUNITY): Payer: Medicare Other

## 2015-04-13 ENCOUNTER — Other Ambulatory Visit: Payer: Self-pay

## 2015-04-14 ENCOUNTER — Encounter (HOSPITAL_COMMUNITY): Payer: Medicare Other

## 2015-04-16 ENCOUNTER — Encounter (HOSPITAL_COMMUNITY): Payer: Medicare Other

## 2015-04-17 DIAGNOSIS — G4700 Insomnia, unspecified: Secondary | ICD-10-CM | POA: Diagnosis not present

## 2015-04-17 DIAGNOSIS — F419 Anxiety disorder, unspecified: Secondary | ICD-10-CM | POA: Diagnosis not present

## 2015-04-17 DIAGNOSIS — E890 Postprocedural hypothyroidism: Secondary | ICD-10-CM | POA: Diagnosis not present

## 2015-04-17 DIAGNOSIS — J449 Chronic obstructive pulmonary disease, unspecified: Secondary | ICD-10-CM | POA: Diagnosis not present

## 2015-04-17 DIAGNOSIS — I10 Essential (primary) hypertension: Secondary | ICD-10-CM | POA: Diagnosis not present

## 2015-04-21 ENCOUNTER — Encounter (HOSPITAL_COMMUNITY): Payer: Medicare Other

## 2015-04-22 ENCOUNTER — Ambulatory Visit: Payer: Medicare Other | Admitting: Pulmonary Disease

## 2015-04-22 ENCOUNTER — Encounter: Payer: Self-pay | Admitting: Internal Medicine

## 2015-04-22 ENCOUNTER — Ambulatory Visit (INDEPENDENT_AMBULATORY_CARE_PROVIDER_SITE_OTHER): Payer: Medicare Other | Admitting: Internal Medicine

## 2015-04-22 VITALS — BP 142/62 | HR 60 | Ht 60.0 in | Wt 160.0 lb

## 2015-04-22 DIAGNOSIS — I6523 Occlusion and stenosis of bilateral carotid arteries: Secondary | ICD-10-CM

## 2015-04-22 DIAGNOSIS — J449 Chronic obstructive pulmonary disease, unspecified: Secondary | ICD-10-CM | POA: Diagnosis not present

## 2015-04-22 MED ORDER — PREDNISONE 10 MG PO TABS
ORAL_TABLET | ORAL | Status: DC
Start: 2015-04-22 — End: 2015-06-08

## 2015-04-22 NOTE — Patient Instructions (Signed)
Prednisone 10 mg take  4 each am x 2 days,   2 each am x 2 days,  1 each am x 2 days and stop   Use mucinex dm up to 1200 mg every 12 hours as needed and use the flutter valve as much as possible   As long as coughing > Try prilosec '20mg'$   Take 30-60 min before first meal of the day and Pepcid 20 mg one bedtime until cough is completely gone for at least a week without the need for cough suppression  GERD (REFLUX)  is an extremely common cause of respiratory symptoms just like yours , many times with no obvious heartburn at all.    It can be treated with medication, but also with lifestyle changes including elevation of the head of your bed (ideally with 6 inch  bed blocks),  Smoking cessation, avoidance of late meals, excessive alcohol, and avoid fatty foods, chocolate, peppermint, colas, red wine, and acidic juices such as orange juice.  NO MINT OR MENTHOL PRODUCTS SO NO COUGH DROPS  USE SUGARLESS CANDY INSTEAD (Jolley ranchers or Stover's or Life Savers) or even ice chips will also do - the key is to swallow to prevent all throat clearing. NO OIL BASED VITAMINS - use powdered substitutes.    Congratulations on not smoking, this is the most important aspect of your care

## 2015-04-22 NOTE — Progress Notes (Signed)
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 aecopd   Chief Complaint  Patient presents with  . Acute Visit    [place] pt here for prod cough with white-yellow mucus, pnd, increased sob X1 month.    much worse cough since late Oct 25th 2015 on acei noted onset cough  immediately p thyroid surgery and persistent dailly since then assoc with worse sob than usual and increase freq of saba use but not helping, much worse day than noct/ assoc with mod hoarseness rec Prednisone 10 mg take  4 each am x 2 days,   2 each am x 2 days,  1 each am x 2 days and stop  Stop lotensin (benazapril) and cozar (losartan) and start benicar 40/25 one half day  Continue bisoprolol as you are as bystolic is very expensive Work on inhaler technique:   Try off spiriva just to see if it makes any difference (think of it high octane fuel)  For cough > deslym two tsp every 12 hours as needed   The key is to stop smoking completely before smoking completely stops you!    02/23/2015 acute  ov/Wert re: GOLD III copd/ still smoking ? Acute exac  Chief Complaint  Patient presents with  . Acute Visit    Pt c/o of increased SOB, dry cough. Has left sided abdominal pain due to fall on  02/20/15. Denies any chest congestion/tightness. Uses albuterol 3-4 times daily.   transiently improves p prednisone then gradually worse with suboptimal  hfa (see a/p) on symbicort/ spiriva rec Plan A = symbicort 160 Take 2 puffs first thing in am and then another 2 puffs about 12 hours later.                 spiriva right after symbicort each am  Plan B = backup Only use your albuterol (proair) as a rescue medication Work on inhaler technique:  Prednisone 10 mg take  4 each am x 2 days,   2 each am x  2 days,  1 each am x 2 days and stop  Please remember to go to the  x-ray department downstairs for your tests - we will call you with the results when they are available. Use mucinex dm up to 1200 mg every 12 hours as needed and use the flutter valve as much as possible The key is to stop smoking completely before smoking completely stops you!  As long as coughing > Try prilosec '20mg'$   Take 30-60 min before first meal of the day and Pepcid 20 mg one bedtime until cough is completely gone for at least a week without the need for cough suppression   02/24/15 quit smoking    6/3/16KC try spiriva respimat >>prefers capsules   04/22/2015 f/u ov/Wert re: GOLD III copd/ quit smoking x 2 m/ maint on symb/spirva  Chief Complaint  Patient presents with  . Follow-up    Previous [place] patient. Pt c/o increased SOB, dry cough since 04/19/15.      Breathing was a little better but then Worse when a/c broke proair 3-4 daily helps some    No obvious day to day or daytime variabilty or assoc  purulent or excess sputum or cp or chest tightness, subjective wheeze overt sinus or hb symptoms. No unusual exp hx or h/o childhood pna/ asthma or knowledge of premature birth.  Sleeping ok without nocturnal  or early am exacerbation  of respiratory  c/o's or need for noct saba. Also denies any obvious fluctuation of symptoms with weather or environmental changes or other aggravating or alleviating factors except as outlined above   Current Medications, Allergies, Complete Past Medical History, Past Surgical History, Family History, and Social History were reviewed in [employer] record.  ROS  The following are not active complaints unless bolded sore throat, dysphagia, dental problems, itching, sneezing,  nasal congestion or excess/ purulent secretions, ear ache,   fever, chills, sweats, unintended wt loss, pleuritic or exertional cp, hemoptysis,  orthopnea pnd or leg swelling, presyncope,  palpitations, heartburn, abdominal pain, anorexia, nausea, vomiting, diarrhea  or change in bowel or urinary habits, change in stools or urine, dysuria,hematuria,  rash, arthralgias, visual complaints, headache, numbness weakness or ataxia or problems with walking or coordination,  change in mood/affect or memory.           Objective:   Physical Exam    amb wf  Mild hoarse/ more hopeful affect   04/22/2015         160  Wt Readings from Last 3 Encounters:  02/23/15 150 lb 9.6 oz (68.312 kg)  01/28/15 148 lb (67.132 kg)  12/23/14 148 lb (67.132 kg)    Vital signs reviewed     HEENT mild turbinate edema.  Oropharynx no thrush or excess pnd or cobblestoning.  No JVD or cervical adenopathy. Mild accessory muscle hypertrophy. Trachea midline, nl thryroid. Chest with   bilateral mid/late exp wheezing   . Regular rate and rhythm without murmur gallop or rub or increase P2 or edema.  Abd: no hsm, nl excursion. Ext warm without cyanosis or clubbing.  Bruising below L costal margin, no splenomegaly noted      I personally reviewed images and agree with radiology impression as follows:  CXR:  02/23/15 Heart, mediastinum and hila are unremarkable. Lungs are hyperexpanded but clear. No pleural effusion or pneumothorax.  Bony thorax is intact. No rib fracture is seen.        Assessment:

## 2015-04-23 ENCOUNTER — Encounter (HOSPITAL_COMMUNITY): Payer: Medicare Other

## 2015-04-25 ENCOUNTER — Encounter: Payer: Self-pay | Admitting: Internal Medicine

## 2015-04-25 NOTE — Assessment & Plan Note (Addendum)
PFT's 2011:  FEV1 0.84 (47%), ratio 43, no better p saba / +airtrapping, DLCO 52%. quit smoking 02/2015  Symptoms remain difficult to control   DDX of  difficult airways management all start with A and  include Adherence, Ace Inhibitors, Acid Reflux, Active Sinus Disease, Alpha 1 Antitripsin deficiency, Anxiety masquerading as Airways dz,  ABPA,  allergy(esp in young), Aspiration (esp in elderly), Adverse effects of meds,  Active smokers, A bunch of PE's (a small clot burden can't cause this syndrome unless there is already severe underlying pulm or vascular dz with poor reserve) plus two Bs  = Bronchiectasis and Beta blocker use..and one C= CHF  Adherence is always the initial "prime suspect" and is a multilayered concern that requires a "trust but verify" approach in every patient - starting with knowing how to use medications, especially inhalers, correctly, keeping up with refills and understanding the fundamental difference between maintenance and prns vs those medications only taken for a very short course and then stopped and not refilled.  -The proper method of use, as well as anticipated side effects, of a metered-dose inhaler are discussed and demonstrated to the patient. Improved effectiveness after extensive coaching during this visit to a level of approximately  75% so ok to continue symbicort 160 for now   ? Acid (or non-acid) GERD > always difficult to exclude as up to 75% of pts in some series report no assoc GI/ Heartburn symptoms> rec max (24h)  acid suppression and diet restrictions/ reviewed and instructions given in writing.  Active smoking > reinforced need for abstinence  ? Allergies/ environmental issues triggered by no ac > Prednisone 10 mg take  4 each am x 2 days,   2 each am x 2 days,  1 each am x 2 days and stop    I had an extended discussion with the patient reviewing all relevant studies completed to date and  lasting 15 to 20 minutes of a 25 minute visit    Each  maintenance medication was reviewed in detail including most importantly the difference between maintenance and prns and under what circumstances the prns are to be triggered using an action plan format that is not reflected in the computer generated alphabetically organized AVS.    Please see instructions for details which were reviewed in writing and the patient given a copy highlighting the part that I personally wrote and discussed at today's ov.

## 2015-04-27 ENCOUNTER — Telehealth: Payer: Self-pay | Admitting: *Deleted

## 2015-04-27 NOTE — Telephone Encounter (Signed)
-----   Message from Tanda Rockers, MD sent at 04/25/2015  4:26 PM EDT ----- Doesn't look like we set f/u ov > let's get her back in about 6 weeks with pfts (or first avail pft date if > 6 weeks wait)

## 2015-04-27 NOTE — Telephone Encounter (Signed)
LMTCB

## 2015-04-28 ENCOUNTER — Encounter (HOSPITAL_COMMUNITY): Payer: Medicare Other

## 2015-04-29 DIAGNOSIS — H52203 Unspecified astigmatism, bilateral: Secondary | ICD-10-CM | POA: Diagnosis not present

## 2015-04-29 DIAGNOSIS — H25813 Combined forms of age-related cataract, bilateral: Secondary | ICD-10-CM | POA: Diagnosis not present

## 2015-04-30 ENCOUNTER — Encounter (HOSPITAL_COMMUNITY): Payer: Medicare Other

## 2015-05-04 NOTE — Telephone Encounter (Signed)
Spoke with the pt and notified of recs per MW She states she wants to f/u with BQ since this is who Corley wanted her to see  She wants to hold off on getting PFT until she sees BQ  I have scheduled her consult with BQ for 06/08/15 at 2:15 pm

## 2015-05-05 ENCOUNTER — Encounter (HOSPITAL_COMMUNITY): Payer: Medicare Other

## 2015-05-07 ENCOUNTER — Encounter (HOSPITAL_COMMUNITY): Payer: Medicare Other

## 2015-05-12 ENCOUNTER — Encounter (HOSPITAL_COMMUNITY): Payer: Medicare Other

## 2015-05-14 ENCOUNTER — Encounter (HOSPITAL_COMMUNITY): Payer: Medicare Other

## 2015-05-19 ENCOUNTER — Encounter (HOSPITAL_COMMUNITY): Payer: Medicare Other

## 2015-05-21 ENCOUNTER — Encounter (HOSPITAL_COMMUNITY): Payer: Medicare Other

## 2015-05-21 ENCOUNTER — Telehealth (HOSPITAL_COMMUNITY): Payer: Self-pay | Admitting: *Deleted

## 2015-05-25 NOTE — Progress Notes (Signed)
Pulmonary Rehab Discharge Note  Holly [name] has been discharged from pulmonary rehab due to transportation issues for a few weeks first.  Her car was in the shop 3 weeks and then she returned back to work which prevented her from returning back to pulmonary rehab.  She did not meet her program goals which were to improve her stamina, strength and reduce her shortness of breath so tasks are easier.  Holly [name] did quit smoking while she was in the program, I hope she remains smoke free.  She was over-using her rescue inhaler when she began the program and I feel she has a better understanding of its proper use.  She was very proud that she quit smoking and her breathing improved.

## 2015-05-25 NOTE — Addendum Note (Signed)
Encounter addended by: Lance Morin, RN on: 05/25/2015  2:02 PM<BR>     Documentation filed: Notes Section

## 2015-05-26 ENCOUNTER — Encounter (HOSPITAL_COMMUNITY): Payer: Medicare Other

## 2015-06-08 ENCOUNTER — Encounter: Payer: Self-pay | Admitting: Pulmonary Disease

## 2015-06-08 ENCOUNTER — Ambulatory Visit (INDEPENDENT_AMBULATORY_CARE_PROVIDER_SITE_OTHER): Payer: Medicare Other | Admitting: Pulmonary Disease

## 2015-06-08 VITALS — BP 126/72 | HR 54 | Ht 60.0 in | Wt 159.0 lb

## 2015-06-08 DIAGNOSIS — Z720 Tobacco use: Secondary | ICD-10-CM

## 2015-06-08 DIAGNOSIS — J449 Chronic obstructive pulmonary disease, unspecified: Secondary | ICD-10-CM | POA: Diagnosis not present

## 2015-06-08 DIAGNOSIS — F17210 Nicotine dependence, cigarettes, uncomplicated: Secondary | ICD-10-CM

## 2015-06-08 DIAGNOSIS — I6523 Occlusion and stenosis of bilateral carotid arteries: Secondary | ICD-10-CM | POA: Diagnosis not present

## 2015-06-08 NOTE — Assessment & Plan Note (Signed)
I congratulated her on quitting in May of this year.

## 2015-06-08 NOTE — Progress Notes (Signed)
Subjective:    Patient ID: Holly [name], female    DOB: 01/23/1946, 69 y.o.   MRN: [id number]  Synopsis: Former patient of Dr. Gwenette Greet with COPD PFT's 2011:  FEV1 0.84 (47%), ratio 43, no better p saba / +airtrapping, DLCO 52%. Quit smoking 02/2015  HPI Chief Complaint  Patient presents with  . Follow-up    Former [place] pt treated for COPD.  Pt c/o sob with exertion worsened by hot/humid temps.     Shaquala has been seeing Dr. Gwenette Greet every 3 months for many years and is here to se me. She has hoarseness late in the day each.  No acid reflux or post nasal drip. This started when it got hot.  No new breathing problems but she has some chronic dypsnea.  She has been using proAir more frequently with the hot weather.  She has been using it as much as 3-4 time per day.  She takes Symbicort and Spiriva.  No flares of COPD since last visit, no antibiotics. She quit smoking in 02/2015.  She has been vaping.   Past Medical History  Diagnosis Date  . Urinary incontinence   . Anxiety   . Cervicalgia   . Hypertension   . Hypercalcemia   . Mixed hyperlipidemia   . Basal cell carcinoma   . COPD (chronic obstructive pulmonary disease)   . Asthma   . 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 Bruises easily   . Frequency of urination   . Thyroid neoplasm        Review of Systems     Objective:   Physical Exam Filed Vitals:   06/08/15 1422  BP: 126/72  Pulse: 54  Height: 5' (1.524 m)  Weight: 159 lb (72.122 kg)  SpO2: 93%   Gen: well appearing HENT: OP clear, TM's clear, neck supple PULM: Crackles bases, normal air movement CV: RRR, no mgr, trace edema GI: BS+, soft, nontender Derm: no cyanosis or rash Psyche: normal mood and affect        Assessment & Plan:  COPD GOLD III  This has been a stable interval for her severe COPD.  She quit smoking which is great.  Sh has been vaping some and is weaning her off of the nicotine solution.  She is not sure if she has had the  prevnar vaccine, she is going to ask her PCP. She quit pulmonary rehab due to her work schedule. Plan: Stay as active as possible, today I recommended 15-20 minutes of cardiovascular exercise and I recommended resistance training Continue Symbicort and Spiriva I have asked her to check with her primary care physician about the Prevnar vaccine Follow-up 3 months or sooner if needed   Cigarette smoker I congratulated her on quitting in May of this year.     Current outpatient prescriptions:  .  albuterol (PROVENTIL HFA;VENTOLIN HFA) 108 (90 BASE) MCG/ACT inhaler, Inhale 2 puffs into the lungs every 4 (four) hours as needed for wheezing or shortness of breath. , Disp: , Rfl:  .  aspirin 81 MG tablet, Take 81 mg by mouth daily., Disp: , Rfl:  .  bisoprolol (ZEBETA) 10 MG tablet, Take 1 tablet (10 mg total) by mouth every morning., Disp: 30 tablet, Rfl: 1 .  budesonide-formoterol (SYMBICORT) 160-4.5 MCG/ACT inhaler, Inhale 2 puffs into the lungs 2 (two) times daily., Disp: 10.2 g, Rfl: 5 .  busPIRone (BUSPAR) 5 MG tablet, Take 5 mg by mouth 2 (two)  times daily., Disp: , Rfl:  .  nystatin (MYCOSTATIN) 100000 UNIT/ML suspension, Take 5 mLs (500,000 Units total) by mouth as needed., Disp: 60 mL, Rfl: 0 .  olmesartan-hydrochlorothiazide (BENICAR HCT) 20-12.5 MG per tablet, Take 1 tablet by mouth daily., Disp: 14 tablet, Rfl: 0 .  Respiratory Therapy Supplies (FLUTTER) DEVI, Use as directed, Disp: 1 each, Rfl: 0 .  SYNTHROID 75 MCG tablet, Take 1 tablet (75 mcg total) by mouth daily before breakfast. (Patient taking differently: Take 88 mcg by mouth daily before breakfast. ), Disp: 30 tablet, Rfl: 3 .  tiotropium (SPIRIVA) 18 MCG inhalation capsule, Place 1 capsule (18 mcg total) into inhaler and inhale daily., Disp: 30 capsule, Rfl: 5 .  zolpidem (AMBIEN) 10 MG tablet, Take 5 mg by mouth at bedtime as needed for sleep. , Disp: , Rfl:

## 2015-06-08 NOTE — Assessment & Plan Note (Signed)
This has been a stable interval for her severe COPD.  She quit smoking which is great.  Sh has been vaping some and is weaning her off of the nicotine solution.  She is not sure if she has had the prevnar vaccine, she is going to ask her PCP. She quit pulmonary rehab due to her work schedule. Plan: Stay as active as possible, today I recommended 15-20 minutes of cardiovascular exercise and I recommended resistance training Continue Symbicort and Spiriva I have asked her to check with her primary care physician about the Prevnar vaccine Follow-up 3 months or sooner if needed

## 2015-06-08 NOTE — Patient Instructions (Signed)
Keep taking your medication as you are doing Ask your primary care physician if you have had the Prevnar vaccine Get a flu shot in the fall Stay as active as possible with regular exercises We will see you back in 3 months or sooner if needed

## 2015-06-23 ENCOUNTER — Ambulatory Visit: Payer: Medicare Other | Admitting: Pulmonary Disease

## 2015-06-25 DIAGNOSIS — H2511 Age-related nuclear cataract, right eye: Secondary | ICD-10-CM | POA: Diagnosis not present

## 2015-06-25 DIAGNOSIS — H25041 Posterior subcapsular polar age-related cataract, right eye: Secondary | ICD-10-CM | POA: Diagnosis not present

## 2015-06-25 DIAGNOSIS — H25811 Combined forms of age-related cataract, right eye: Secondary | ICD-10-CM | POA: Diagnosis not present

## 2015-08-21 ENCOUNTER — Telehealth: Payer: Self-pay | Admitting: Pulmonary Disease

## 2015-08-21 DIAGNOSIS — Z23 Encounter for immunization: Secondary | ICD-10-CM | POA: Diagnosis not present

## 2015-08-21 NOTE — Telephone Encounter (Signed)
Spoke with pt. She was suppose to be at her PCP appt today and got appts mixed up. She never checked with PCP to which PNA vaccine she has had and when. She will check with them and let us know. Pt left. Nothing further needed

## 2015-08-31 DIAGNOSIS — E890 Postprocedural hypothyroidism: Secondary | ICD-10-CM | POA: Diagnosis not present

## 2015-09-21 ENCOUNTER — Telehealth: Payer: Self-pay | Admitting: Pulmonary Disease

## 2015-09-21 NOTE — Telephone Encounter (Signed)
Called and spoke with pt Pt requesting sample of spiriva and symbicort Informed pt that office does not have any samples of spiriva but i could leave samples of symbicort up front for her to pick up Pt stated she would pick up samples tomorrow  Nothing further is needed at this time.

## 2015-10-23 ENCOUNTER — Ambulatory Visit: Payer: Medicare Other | Admitting: Pulmonary Disease

## 2015-10-30 DIAGNOSIS — F419 Anxiety disorder, unspecified: Secondary | ICD-10-CM | POA: Diagnosis not present

## 2015-10-30 DIAGNOSIS — J449 Chronic obstructive pulmonary disease, unspecified: Secondary | ICD-10-CM | POA: Diagnosis not present

## 2015-10-30 DIAGNOSIS — R490 Dysphonia: Secondary | ICD-10-CM | POA: Diagnosis not present

## 2015-10-30 DIAGNOSIS — M8580 Other specified disorders of bone density and structure, unspecified site: Secondary | ICD-10-CM | POA: Diagnosis not present

## 2015-10-30 DIAGNOSIS — B370 Candidal stomatitis: Secondary | ICD-10-CM | POA: Diagnosis not present

## 2015-10-30 DIAGNOSIS — Z0000 Encounter for general adult medical examination without abnormal findings: Secondary | ICD-10-CM | POA: Diagnosis not present

## 2015-10-30 DIAGNOSIS — E890 Postprocedural hypothyroidism: Secondary | ICD-10-CM | POA: Diagnosis not present

## 2015-10-30 DIAGNOSIS — I10 Essential (primary) hypertension: Secondary | ICD-10-CM | POA: Diagnosis not present

## 2015-10-30 DIAGNOSIS — F325 Major depressive disorder, single episode, in full remission: Secondary | ICD-10-CM | POA: Diagnosis not present

## 2015-10-30 DIAGNOSIS — F17201 Nicotine dependence, unspecified, in remission: Secondary | ICD-10-CM | POA: Diagnosis not present

## 2015-11-11 ENCOUNTER — Encounter: Payer: Self-pay | Admitting: Acute Care

## 2015-11-11 ENCOUNTER — Ambulatory Visit (INDEPENDENT_AMBULATORY_CARE_PROVIDER_SITE_OTHER): Payer: Medicare Other | Admitting: Acute Care

## 2015-11-11 VITALS — BP 122/76 | HR 61 | Temp 97.8°F | Wt 173.8 lb

## 2015-11-11 DIAGNOSIS — F17210 Nicotine dependence, cigarettes, uncomplicated: Secondary | ICD-10-CM

## 2015-11-11 DIAGNOSIS — Z720 Tobacco use: Secondary | ICD-10-CM

## 2015-11-11 DIAGNOSIS — R05 Cough: Secondary | ICD-10-CM | POA: Diagnosis not present

## 2015-11-11 DIAGNOSIS — J449 Chronic obstructive pulmonary disease, unspecified: Secondary | ICD-10-CM | POA: Diagnosis not present

## 2015-11-11 DIAGNOSIS — R059 Cough, unspecified: Secondary | ICD-10-CM

## 2015-11-11 MED ORDER — OMEPRAZOLE 40 MG PO CPDR: 40.0000 mg | DELAYED_RELEASE_CAPSULE | Freq: Every day | ORAL | Status: DC

## 2015-11-11 NOTE — Assessment & Plan Note (Signed)
Patient quit smoking in May 2016. Plan: Congratulated patient on her successful liberation from cigarettes. Counseled on importance of remaining smoke free. Encouraged patient to call the office for assistance in the event she feels the need to smoke again so that we can help her maintain her goal of  maintaining smoking cessation.

## 2015-11-11 NOTE — Patient Instructions (Addendum)
It is nice to meet you today. Congratulations on quitting smoking. Continue your Spiriva and Symbicort regimen for your COPD. Use your [employer]ne[employer] as needed. Start omeprizol 40 mg once daily. Do not suck or chew on mints. Use candy, like jolly ranchers. Take sips of water instead of clearing your throat. Use saline mist as needed for nasal dryness. If this does not improve your symptoms, we can try allergy medications when you come for your next appointment. Let us know what the ENT feels is causing  of the sensation of fullness in your throat, and if we can be of any help  Follow up in 3 months with Dr. Pennie Banter or sooner if you feel you need [place]. Please contact office for sooner follow up if symptoms do not improve or worsen or seek emergency care.

## 2015-11-11 NOTE — Progress Notes (Signed)
Chief Complaint  Patient presents with  . Follow-up    Pt reports breathing has been stable. Pt has very little cough. no wheezing, no chest tx. Pt feels she is doing well overall.      Tests Tests PFT's 2011:  FEV1 0.84 (47%), ratio 43, no better p saba / +airtrapping, DLCO 52%. quit smoking 02/2015   Last pulmonary function tests done 11/11/2014 show very severe obstruction, with low vital capacity. FVC          Predicted: 2.38; Pre-actual: .98 Pre-% predicted: 41% FEV1                          1.85                     .40                             22% FEV1/FVC                  78%                     41%                          52% Vital Capacity             2.38    Last  CXR:02/2015  FINDINGS: Heart, mediastinum and hila are unremarkable. Lungs are hyperexpanded but clear. No pleural effusion or pneumothorax.  Bony thorax is intact. No rib fracture is seen.  IMPRESSION: 1. No acute cardiopulmonary disease. 2. COPD.   Past medical hx Past Medical History  Diagnosis Date  . Urinary incontinence   . Anxiety   . Cervicalgia   . Hypertension   . Hypercalcemia   . Mixed hyperlipidemia   . Basal cell carcinoma   . COPD (chronic obstructive pulmonary disease) ([place])   . Asthma   . 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 Bruises easily   . Frequency of urination   . Thyroid neoplasm      Past surgical hx, Allergies, Family hx, Social hx all reviewed.  Current Outpatient Prescriptions on File Prior to Visit  Medication Sig  . albuterol (PROVENTIL HFA;VENTOLIN HFA) 108 (90 BASE) MCG/ACT inhaler Inhale 2 puffs into the lungs every 4 (four) hours as needed for wheezing or shortness of breath.   Marland Kitchen aspirin 81 MG tablet Take 81 mg by mouth daily.  . bisoprolol (ZEBETA) 10 MG tablet Take 1 tablet (10 mg total) by mouth every morning.  . budesonide-formoterol (SYMBICORT) 160-4.5 MCG/ACT inhaler Inhale 2 puffs into the lungs 2 (two) times daily.  . busPIRone  (BUSPAR) 5 MG tablet Take 5 mg by mouth 2 (two) times daily.  Marland Kitchen nystatin (MYCOSTATIN) 100000 UNIT/ML suspension Take 5 mLs (500,000 Units total) by mouth as needed.  Marland Kitchen olmesartan-hydrochlorothiazide (BENICAR HCT) 20-12.5 MG per tablet Take 1 tablet by mouth daily.  Marland Kitchen Respiratory Therapy Supplies (FLUTTER) DEVI Use as directed  . tiotropium (SPIRIVA) 18 MCG inhalation capsule Place 1 capsule (18 mcg total) into inhaler and inhale daily.  Marland Kitchen zolpidem (AMBIEN) 10 MG tablet Take 5 mg by mouth at bedtime as needed for sleep.    No current facility-administered medications on file prior to visit.     Vital Signs BP 122/76 mmHg  Pulse 61  Temp(Src) 97.8 F (36.6 C) (Oral)  Wt 173 lb 12.8 oz (78.835 kg)  SpO2 96%  History of Present Illness Holly [name] is a 70 y.o. female with gold stage III COPD. She quit smoking in May 2016. She returns to the office today for her three-month follow-up.She feels she has been at baseline with her COPD. She continues to use Spiriva and Symbicort as her COPD maintenance regimen. She uses pro-air as a rescue medication. She continues to complain of hoarseness especially in the evenings. She has an appointment with ENT Friday, 11/11/2014 for evaluation. She complains of postnasal drip which is causing her to clear her throat frequently. We discussed the possibility that this could be related to GERD or seasonal allergies. She prefers to treat for GERD at present and if that treatment does not alleviate the problem she would like to  add  the allergy regimen to her treatment.  Review of Systems:  Constitutional:   No  weight loss, night sweats,  Fevers, chills, fatigue, or  lassitude.  HEENT:   No headaches,  Difficulty swallowing, but sensation of fullness in her throat.,  NoTooth/dental problems, or  Sore throat, No sneezing, itching, ear ache, nasal congestion, +post nasal drip,   CV:  No chest pain,  Orthopnea, PND, swelling in lower extremities, anasarca,  dizziness, palpitations, syncope.   GI  No heartburn, possible indigestion, abdominal pain, nausea, vomiting, diarrhea, change in bowel habits, loss of appetite, bloody stools.   Resp: No shortness of breath with exertion or at rest.  No excess mucus, no productive cough,  No non-productive cough,  No coughing up of blood.  No change in color of mucus.  No wheezing.  No chest wall deformity  Skin: no rash or lesions.  GU: no dysuria, change in color of urine, no urgency or frequency.  No flank pain, no hematuria   MS:  No joint pain or swelling.  No decreased range of motion.  No back pain.  Psych:  No change in mood or affect. No depression or anxiety.  No memory loss.     Physical Exam:   General- No distress,  A&Ox3 ENT: No sinus tenderness, TM clear, pale nasal mucosa, no oral exudate,+ post nasal drip, no LAN Cardiac: S1, S2, regular rate and rhythm, no murmur Chest: No wheeze/ rales/ dullness; no accessory muscle use, no nasal flaring, no sternal retractions Abd.: Soft Non-tender Ext: No edema Neuro:  normal strength Skin: No rashes, warm and dry Psych: normal mood and behavior    Assessment/Plan  Cough: Patient states she has a sensation of fullness in her throat that causes her to cough and clear her throat frequently. She does have postnasal drip which she thinks is contributing to the problem. We discussed the possibility that this could be a result of either GERD or allergies. Patient requested that we start with GERD treatment. She would prefer not to start 2 treatments at once so that we can determine which of the 2 possible problems is the cause.  Plan: Start omeprizol 40 mg once daily. Do not suck or chew on mints. Use candy, like jolly ranchers. Take sips of water instead of clearing your throat. Use saline mist as needed for nasal dryness. If this does not improve your symptoms, we can try allergy medications when you come for your next appointment. Let  us know what the ENT feels is causing  of the sensation of fullness in your throat, and if we  can be of any help  Follow up in 3 months with Dr. Pennie Banter or sooner if you feel you need [place]. Please contact office for sooner follow up if symptoms do not improve or worsen or seek emergency care.   COPD Gold III  Patient feels COPD is stable and at baseline. She quit smoking in May 2016 . She still complains of hoarseness in her voice especially late in each day. She has an appointment with an ear nose throat specialist 11/12/2014 to evaluate.  Plan: Continue your Spiriva and Symbicort regimen for your COPD. Use your Dynegy as needed. Continue to stay active Follow-up with Dr. Lake Bells in 3 months. Please contact office for sooner follow up if symptoms do not improve or worsen or seek emergency care     Cigarette Smoker:  Patient quit smoking in May 2016.  Plan: Congratulated patient on her successful liberation from cigarettes. Counseled on importance of remaining smoke free. Encouraged patient to call the office for assistance in the event she feels the need to smoke again so that we can help her maintain her goal of  maintaining smoking cessation.   Patient Instructions  It is nice to meet you today. Congratulations on quitting smoking. Continue your Spiriva and Symbicort regimen for your COPD. Use your Dynegy as needed. Start omeprizol 40 mg once daily. Do not suck or chew on mints. Use candy, like jolly ranchers. Take sips of water instead of clearing your throat. Use saline mist as needed for nasal dryness. If this does not improve your symptoms, we can try allergy medications when you come for your next appointment. Let us know what the ENT feels is causing  of the sensation of fullness in your throat, and if we can be of any help  Follow up in 3 months with Dr. Pennie Banter or sooner if you feel you need [place]. Please contact office for sooner follow up if symptoms do not improve  or worsen or seek emergency care.      Eric Form, NP 11/11/2015  8:19 PM

## 2015-11-11 NOTE — Assessment & Plan Note (Signed)
Patient states she has a sensation of fullness in her throat that causes her to cough and clear her throat frequently. She does have postnasal drip which she thinks is contributing to the problem. We discussed the possibility that this could be a result of either GERD or allergies. Patient requested that we start with GERD treatment. She would prefer not to start 2 treatments at once so that we can determine which of the 2 possible problems is the cause. Plan:  Start omeprizol 40 mg once daily. Do not suck or chew on mints. Use candy, like jolly ranchers. Take sips of water instead of clearing your throat. Use saline mist as needed for nasal dryness. If this does not improve your symptoms, we can try allergy medications when you come for your next appointment. Let us know what the ENT feels is causing  of the sensation of fullness in your throat, and if we can be of any help  Follow up in 3 months with Dr. Pennie Banter or sooner if you feel you need [place]. Please contact office for sooner follow up if symptoms do not improve or worsen or seek emergency care.

## 2015-11-11 NOTE — Assessment & Plan Note (Signed)
Patient feels COPD is stable and at baseline. She quit smoking in May 2016 . She still complains of hoarseness in her voice especially late in each day. She has an appointment with an ear nose throat specialist 11/12/2014 to evaluate. Plan: Continue your Spiriva and Symbicort regimen for your COPD. Use your Dynegy as needed. Continue to stay active Follow-up with Dr. Lake Bells in 3 months. Please contact office for sooner follow up if symptoms do not improve or worsen or seek emergency care

## 2015-11-13 DIAGNOSIS — R499 Unspecified voice and resonance disorder: Secondary | ICD-10-CM | POA: Diagnosis not present

## 2015-11-13 DIAGNOSIS — J387 Other diseases of larynx: Secondary | ICD-10-CM | POA: Diagnosis not present

## 2015-11-16 NOTE — Progress Notes (Signed)
Reviewed, I agree with this plan of care 

## 2015-11-17 ENCOUNTER — Other Ambulatory Visit: Payer: Self-pay | Admitting: Family Medicine

## 2015-11-17 DIAGNOSIS — I6529 Occlusion and stenosis of unspecified carotid artery: Secondary | ICD-10-CM

## 2015-12-03 DIAGNOSIS — J209 Acute bronchitis, unspecified: Secondary | ICD-10-CM | POA: Diagnosis not present

## 2015-12-03 DIAGNOSIS — J0100 Acute maxillary sinusitis, unspecified: Secondary | ICD-10-CM | POA: Diagnosis not present

## 2015-12-04 ENCOUNTER — Other Ambulatory Visit: Payer: Medicare Other

## 2016-01-06 ENCOUNTER — Other Ambulatory Visit: Payer: Medicare Other

## 2016-01-15 ENCOUNTER — Ambulatory Visit
Admission: RE | Admit: 2016-01-15 | Discharge: 2016-01-15 | Disposition: A | Discharge status: Home / Self Care | Payer: Medicare Other | Source: Ambulatory Visit | Attending: Family Medicine | Admitting: Family Medicine

## 2016-01-15 DIAGNOSIS — I6529 Occlusion and stenosis of unspecified carotid artery: Secondary | ICD-10-CM

## 2016-01-15 DIAGNOSIS — I6523 Occlusion and stenosis of bilateral carotid arteries: Secondary | ICD-10-CM | POA: Diagnosis not present

## 2016-01-15 IMAGING — US US CAROTID DUPLEX BILAT
1 series · 13 of 24 positions shown · non-contrast
Comparison: [DATE]

CLINICAL DATA: Carotid atherosclerosis, hypertension,
hyperlipidemia, remote smoker

EXAM:
BILATERAL CAROTID DUPLEX ULTRASOUND
TECHNIQUE: Gray scale imaging, color Doppler and duplex ultrasound were
performed of bilateral carotid and vertebral arteries in the neck.

[Series 1: us carotid duplex bilat · 0.09mm/px · 13 of 71 slices shown]
[im 1/71]
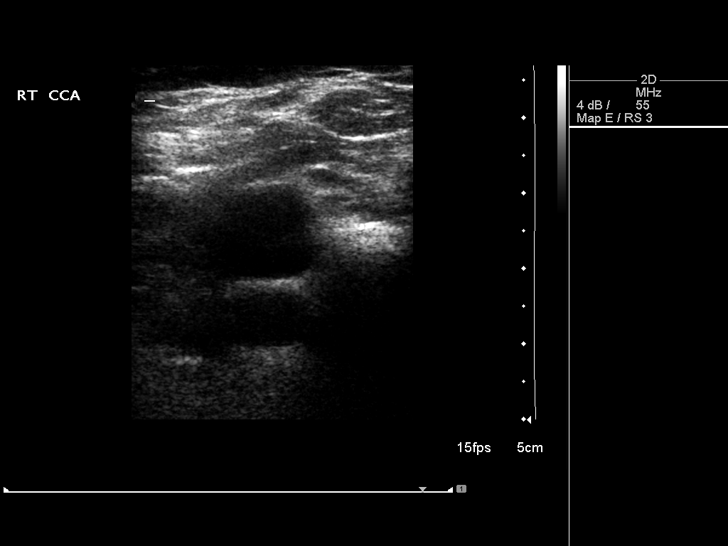
[im 7/71]
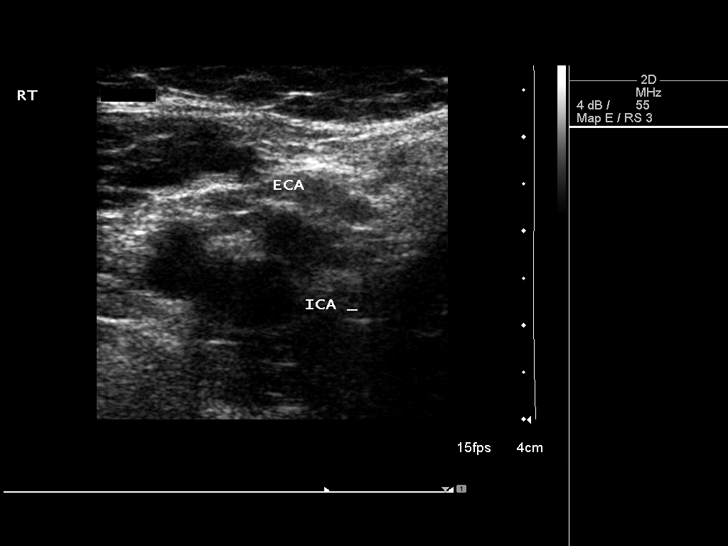
[im 13/71]
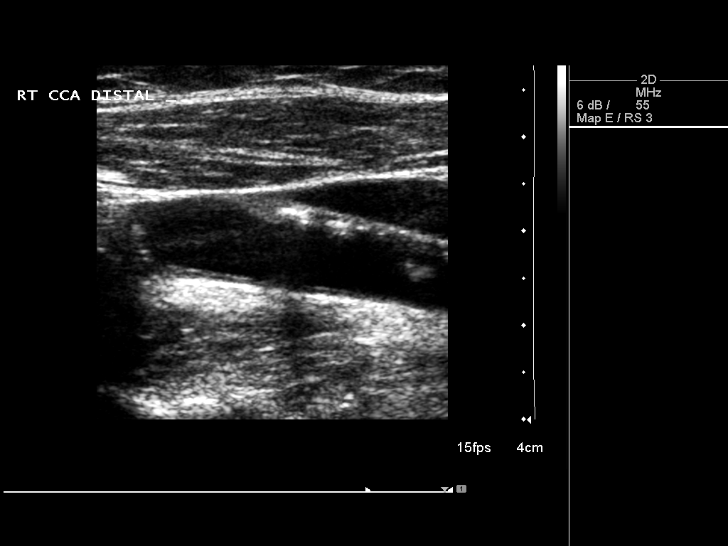
[im 19/71]
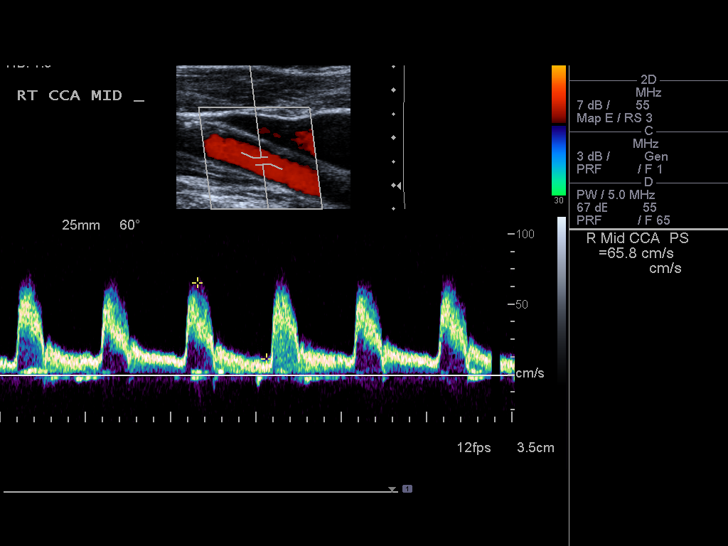
[im 25/71]
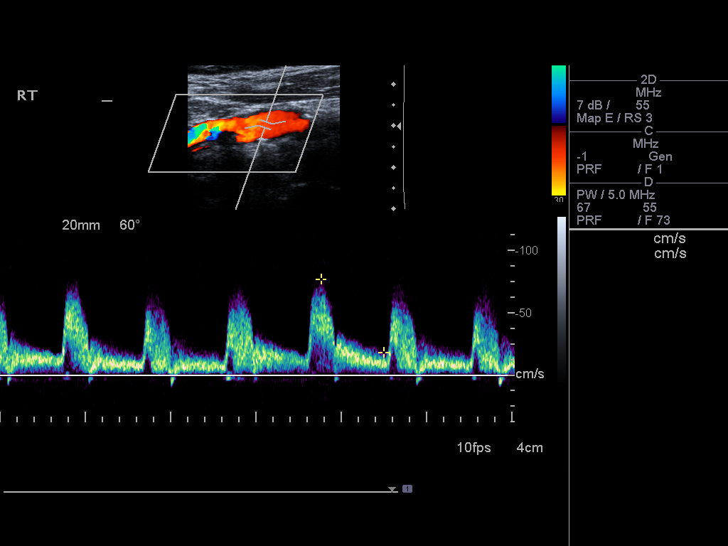
[im 31/71]
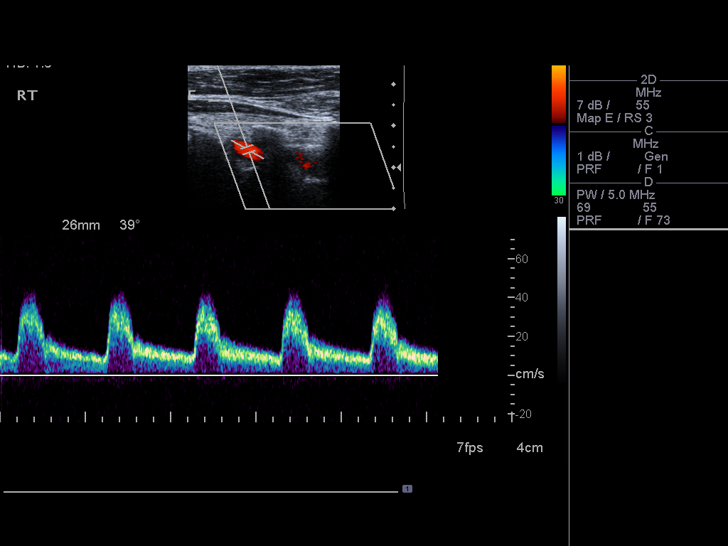
[im 37/71]
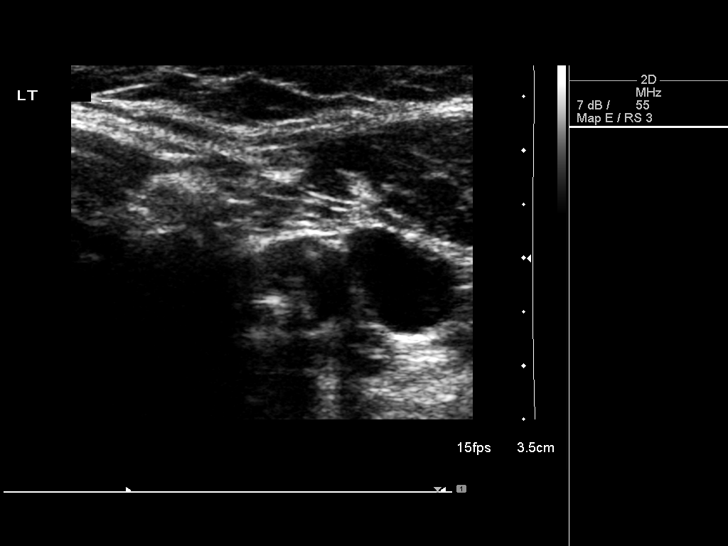
[im 40/71]
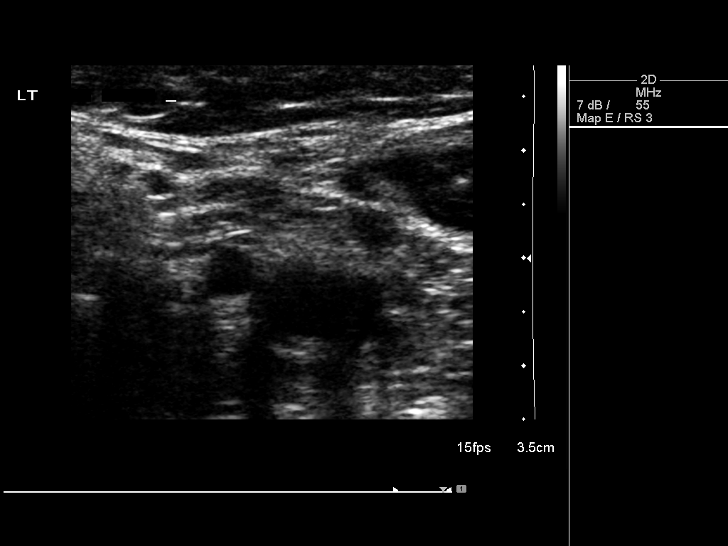
[im 46/71]
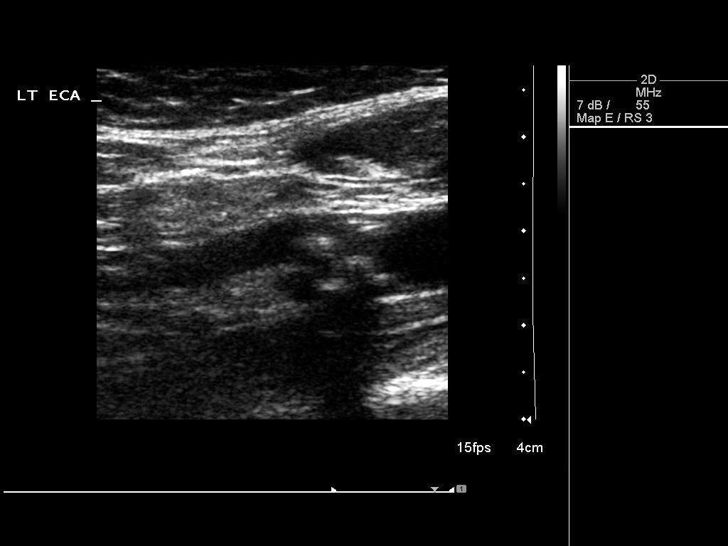
[im 52/71]
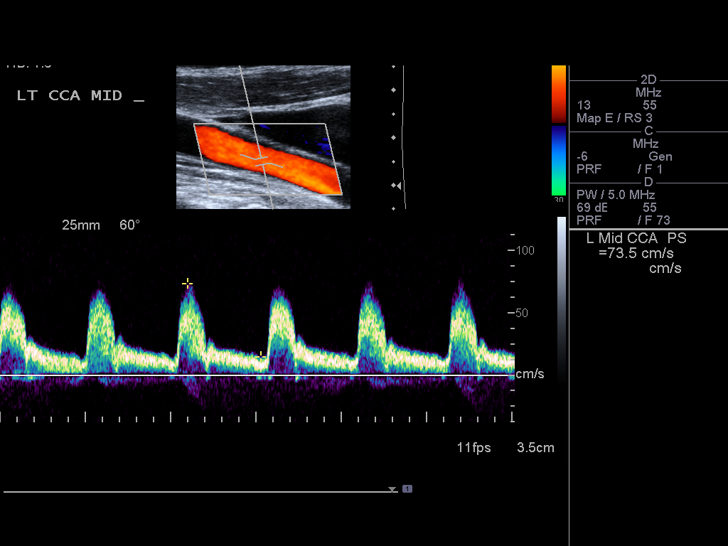
[im 58/71]
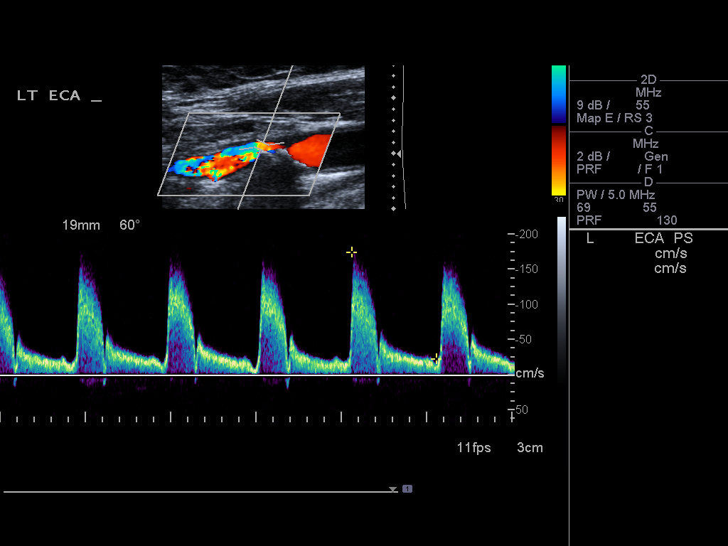
[im 64/71]
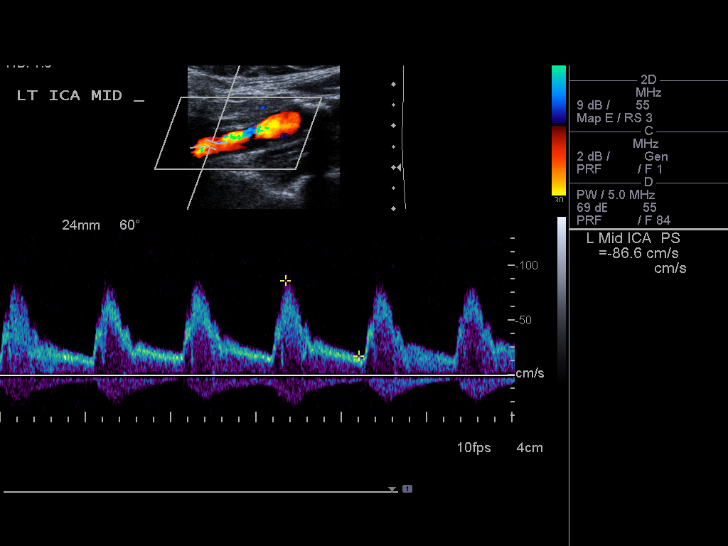
[im 71/71]
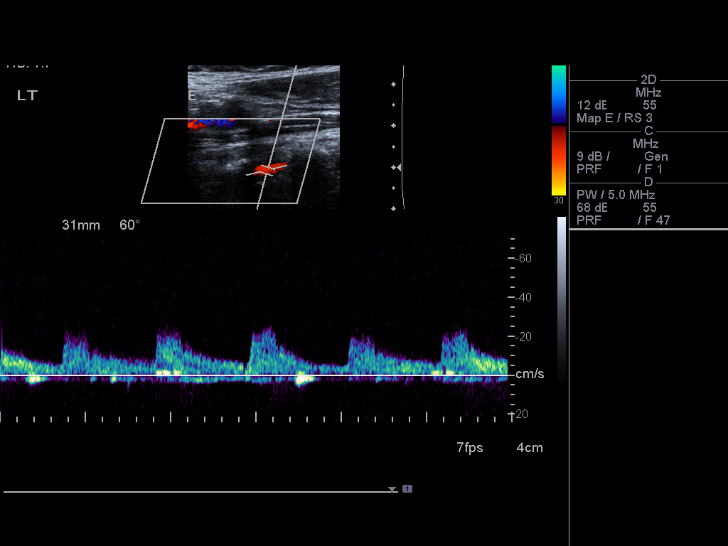

[13 of 24 positions shown; findings below may reference images not displayed]

FINDINGS: Criteria: Quantification of carotid stenosis is based on velocity
parameters that correlate the residual internal carotid diameter
with NASCET-based stenosis levels, using the diameter of the distal
internal carotid lumen as the denominator for stenosis measurement.

The following velocity measurements were obtained:

RIGHT

ICA:  86/16 cm/sec

CCA:  77/14 cm/sec

SYSTOLIC ICA/CCA RATIO:

DIASTOLIC ICA/CCA RATIO:

ECA:  116 cm/sec

LEFT

ICA:  92/21 cm/sec

CCA:  76/17 cm/sec

SYSTOLIC ICA/CCA RATIO:

DIASTOLIC ICA/CCA RATIO:

ECA:  202 cm/sec

RIGHT CAROTID ARTERY: Minor echogenic shadowing plaque formation. No
hemodynamically significant right ICA stenosis, velocity elevation,
or turbulent flow. Degree of narrowing less than 50%.

RIGHT VERTEBRAL ARTERY:  Antegrade

LEFT CAROTID ARTERY: Similar scattered mild to moderate echogenic
plaque formation. No hemodynamically significant left ICA stenosis,
velocity elevation, or turbulent flow. Degree of stenosis also
estimated less than 50%.

LEFT VERTEBRAL ARTERY:  Antegrade
IMPRESSION: Left greater the right carotid atherosclerosis.

Degree of narrowing less than 50% bilaterally.

Patent antegrade vertebral flow bilaterally.

## 2016-02-01 ENCOUNTER — Telehealth: Payer: Self-pay | Admitting: Pulmonary Disease

## 2016-02-01 NOTE — Telephone Encounter (Signed)
1 sample of symbicort 160/4.5 up front for pick up  Nothing further needed per pt

## 2016-02-05 ENCOUNTER — Ambulatory Visit: Payer: Medicare Other | Admitting: Pulmonary Disease

## 2016-02-05 DIAGNOSIS — Z1231 Encounter for screening mammogram for malignant neoplasm of breast: Secondary | ICD-10-CM | POA: Diagnosis not present

## 2016-02-05 DIAGNOSIS — M8589 Other specified disorders of bone density and structure, multiple sites: Secondary | ICD-10-CM | POA: Diagnosis not present

## 2016-03-02 DIAGNOSIS — K219 Gastro-esophageal reflux disease without esophagitis: Secondary | ICD-10-CM | POA: Diagnosis present

## 2016-03-02 DIAGNOSIS — J9621 Acute and chronic respiratory failure with hypoxia: Secondary | ICD-10-CM | POA: Diagnosis not present

## 2016-03-02 DIAGNOSIS — G4733 Obstructive sleep apnea (adult) (pediatric): Secondary | ICD-10-CM | POA: Diagnosis present

## 2016-03-02 DIAGNOSIS — J181 Lobar pneumonia, unspecified organism: Secondary | ICD-10-CM | POA: Diagnosis not present

## 2016-03-02 DIAGNOSIS — I10 Essential (primary) hypertension: Secondary | ICD-10-CM | POA: Diagnosis not present

## 2016-03-02 DIAGNOSIS — R0902 Hypoxemia: Secondary | ICD-10-CM | POA: Diagnosis not present

## 2016-03-02 DIAGNOSIS — F329 Major depressive disorder, single episode, unspecified: Secondary | ICD-10-CM | POA: Diagnosis present

## 2016-03-02 DIAGNOSIS — N179 Acute kidney failure, unspecified: Secondary | ICD-10-CM | POA: Diagnosis not present

## 2016-03-02 DIAGNOSIS — R509 Fever, unspecified: Secondary | ICD-10-CM | POA: Diagnosis not present

## 2016-03-02 DIAGNOSIS — J441 Chronic obstructive pulmonary disease with (acute) exacerbation: Secondary | ICD-10-CM | POA: Diagnosis not present

## 2016-03-02 DIAGNOSIS — E538 Deficiency of other specified B group vitamins: Secondary | ICD-10-CM | POA: Diagnosis present

## 2016-03-02 DIAGNOSIS — J449 Chronic obstructive pulmonary disease, unspecified: Secondary | ICD-10-CM | POA: Diagnosis not present

## 2016-03-02 DIAGNOSIS — E039 Hypothyroidism, unspecified: Secondary | ICD-10-CM | POA: Diagnosis present

## 2016-03-02 DIAGNOSIS — J9602 Acute respiratory failure with hypercapnia: Secondary | ICD-10-CM | POA: Diagnosis present

## 2016-03-02 DIAGNOSIS — Z79899 Other long term (current) drug therapy: Secondary | ICD-10-CM | POA: Diagnosis not present

## 2016-03-02 DIAGNOSIS — F410 Panic disorder [episodic paroxysmal anxiety] without agoraphobia: Secondary | ICD-10-CM | POA: Diagnosis present

## 2016-03-02 DIAGNOSIS — E669 Obesity, unspecified: Secondary | ICD-10-CM | POA: Diagnosis present

## 2016-03-02 DIAGNOSIS — Z885 Allergy status to narcotic agent status: Secondary | ICD-10-CM | POA: Diagnosis not present

## 2016-03-02 DIAGNOSIS — Z87891 Personal history of nicotine dependence: Secondary | ICD-10-CM | POA: Diagnosis not present

## 2016-03-02 DIAGNOSIS — R918 Other nonspecific abnormal finding of lung field: Secondary | ICD-10-CM | POA: Diagnosis not present

## 2016-03-02 DIAGNOSIS — J9690 Respiratory failure, unspecified, unspecified whether with hypoxia or hypercapnia: Secondary | ICD-10-CM | POA: Diagnosis not present

## 2016-03-02 DIAGNOSIS — J189 Pneumonia, unspecified organism: Secondary | ICD-10-CM | POA: Diagnosis present

## 2016-03-02 DIAGNOSIS — I270 Primary pulmonary hypertension: Secondary | ICD-10-CM | POA: Diagnosis not present

## 2016-03-02 DIAGNOSIS — J440 Chronic obstructive pulmonary disease with acute lower respiratory infection: Secondary | ICD-10-CM | POA: Diagnosis not present

## 2016-03-02 DIAGNOSIS — J9622 Acute and chronic respiratory failure with hypercapnia: Secondary | ICD-10-CM | POA: Diagnosis not present

## 2016-03-02 DIAGNOSIS — R0602 Shortness of breath: Secondary | ICD-10-CM | POA: Diagnosis not present

## 2016-03-02 DIAGNOSIS — J9601 Acute respiratory failure with hypoxia: Secondary | ICD-10-CM | POA: Diagnosis not present

## 2016-03-08 ENCOUNTER — Telehealth: Payer: Self-pay | Admitting: Pulmonary Disease

## 2016-03-08 NOTE — Telephone Encounter (Signed)
Spoke with pt's daughter, she states that pt was admitted to [facility] 03/02/16 with pna. Pt is on IV abx, Lasix and is on BiPAP with O2. Pt is getting nebulizer treatments several times a day. She was wanting to check and see if there were any further recommendations or if BQ could come to [place] to see pt. Advised her that we generally will not overstep pt's current treatment plan unless the treating physician would like to contact BQ for case discussion. Also advised her that our physicians do not have privileges at [facility] to be able to enter hospital and treat patients. Explained to pt's daughter that we can certainly schedule her for a hospital f/u once she has been d/c'd. She voiced understanding. She states that she will discuss with treating physician if he would be able to contact BQ for any further recommendations. Advised her that BQ will not be in the office until Thursday.   Routing to BQ as FYI.

## 2016-03-08 NOTE — Telephone Encounter (Signed)
noted 

## 2016-03-11 ENCOUNTER — Ambulatory Visit: Payer: Medicare Other | Admitting: Pulmonary Disease

## 2016-03-13 DIAGNOSIS — I10 Essential (primary) hypertension: Secondary | ICD-10-CM | POA: Diagnosis not present

## 2016-03-13 DIAGNOSIS — J9621 Acute and chronic respiratory failure with hypoxia: Secondary | ICD-10-CM | POA: Diagnosis not present

## 2016-03-13 DIAGNOSIS — K219 Gastro-esophageal reflux disease without esophagitis: Secondary | ICD-10-CM | POA: Diagnosis not present

## 2016-03-13 DIAGNOSIS — Z7982 Long term (current) use of aspirin: Secondary | ICD-10-CM | POA: Diagnosis not present

## 2016-03-13 DIAGNOSIS — J9602 Acute respiratory failure with hypercapnia: Secondary | ICD-10-CM | POA: Diagnosis not present

## 2016-03-13 DIAGNOSIS — Z9911 Dependence on respirator [ventilator] status: Secondary | ICD-10-CM | POA: Diagnosis not present

## 2016-03-13 DIAGNOSIS — Z87891 Personal history of nicotine dependence: Secondary | ICD-10-CM | POA: Diagnosis not present

## 2016-03-13 DIAGNOSIS — Z8701 Personal history of pneumonia (recurrent): Secondary | ICD-10-CM | POA: Diagnosis not present

## 2016-03-13 DIAGNOSIS — Z9981 Dependence on supplemental oxygen: Secondary | ICD-10-CM | POA: Diagnosis not present

## 2016-03-13 DIAGNOSIS — J441 Chronic obstructive pulmonary disease with (acute) exacerbation: Secondary | ICD-10-CM | POA: Diagnosis not present

## 2016-03-16 DIAGNOSIS — J9602 Acute respiratory failure with hypercapnia: Secondary | ICD-10-CM | POA: Diagnosis not present

## 2016-03-16 DIAGNOSIS — Z87891 Personal history of nicotine dependence: Secondary | ICD-10-CM | POA: Diagnosis not present

## 2016-03-16 DIAGNOSIS — J9621 Acute and chronic respiratory failure with hypoxia: Secondary | ICD-10-CM | POA: Diagnosis not present

## 2016-03-16 DIAGNOSIS — I10 Essential (primary) hypertension: Secondary | ICD-10-CM | POA: Diagnosis not present

## 2016-03-16 DIAGNOSIS — J441 Chronic obstructive pulmonary disease with (acute) exacerbation: Secondary | ICD-10-CM | POA: Diagnosis not present

## 2016-03-16 DIAGNOSIS — K219 Gastro-esophageal reflux disease without esophagitis: Secondary | ICD-10-CM | POA: Diagnosis not present

## 2016-03-18 DIAGNOSIS — J9602 Acute respiratory failure with hypercapnia: Secondary | ICD-10-CM | POA: Diagnosis not present

## 2016-03-18 DIAGNOSIS — Z87891 Personal history of nicotine dependence: Secondary | ICD-10-CM | POA: Diagnosis not present

## 2016-03-18 DIAGNOSIS — K219 Gastro-esophageal reflux disease without esophagitis: Secondary | ICD-10-CM | POA: Diagnosis not present

## 2016-03-18 DIAGNOSIS — I10 Essential (primary) hypertension: Secondary | ICD-10-CM | POA: Diagnosis not present

## 2016-03-18 DIAGNOSIS — J441 Chronic obstructive pulmonary disease with (acute) exacerbation: Secondary | ICD-10-CM | POA: Diagnosis not present

## 2016-03-18 DIAGNOSIS — J9621 Acute and chronic respiratory failure with hypoxia: Secondary | ICD-10-CM | POA: Diagnosis not present

## 2016-03-22 DIAGNOSIS — J9602 Acute respiratory failure with hypercapnia: Secondary | ICD-10-CM | POA: Diagnosis not present

## 2016-03-22 DIAGNOSIS — J9621 Acute and chronic respiratory failure with hypoxia: Secondary | ICD-10-CM | POA: Diagnosis not present

## 2016-03-22 DIAGNOSIS — Z87891 Personal history of nicotine dependence: Secondary | ICD-10-CM | POA: Diagnosis not present

## 2016-03-22 DIAGNOSIS — K219 Gastro-esophageal reflux disease without esophagitis: Secondary | ICD-10-CM | POA: Diagnosis not present

## 2016-03-22 DIAGNOSIS — J441 Chronic obstructive pulmonary disease with (acute) exacerbation: Secondary | ICD-10-CM | POA: Diagnosis not present

## 2016-03-22 DIAGNOSIS — I10 Essential (primary) hypertension: Secondary | ICD-10-CM | POA: Diagnosis not present

## 2016-03-24 DIAGNOSIS — I10 Essential (primary) hypertension: Secondary | ICD-10-CM | POA: Diagnosis not present

## 2016-03-24 DIAGNOSIS — J189 Pneumonia, unspecified organism: Secondary | ICD-10-CM | POA: Diagnosis not present

## 2016-03-24 DIAGNOSIS — F17201 Nicotine dependence, unspecified, in remission: Secondary | ICD-10-CM | POA: Diagnosis not present

## 2016-03-24 DIAGNOSIS — J441 Chronic obstructive pulmonary disease with (acute) exacerbation: Secondary | ICD-10-CM | POA: Diagnosis not present

## 2016-03-24 DIAGNOSIS — F419 Anxiety disorder, unspecified: Secondary | ICD-10-CM | POA: Diagnosis not present

## 2016-03-25 DIAGNOSIS — G4733 Obstructive sleep apnea (adult) (pediatric): Secondary | ICD-10-CM | POA: Diagnosis not present

## 2016-03-25 DIAGNOSIS — Z87891 Personal history of nicotine dependence: Secondary | ICD-10-CM | POA: Diagnosis not present

## 2016-03-25 DIAGNOSIS — J449 Chronic obstructive pulmonary disease, unspecified: Secondary | ICD-10-CM | POA: Diagnosis not present

## 2016-03-25 DIAGNOSIS — E559 Vitamin D deficiency, unspecified: Secondary | ICD-10-CM | POA: Diagnosis not present

## 2016-03-25 DIAGNOSIS — R918 Other nonspecific abnormal finding of lung field: Secondary | ICD-10-CM | POA: Diagnosis not present

## 2016-03-26 DIAGNOSIS — Z711 Person with feared health complaint in whom no diagnosis is made: Secondary | ICD-10-CM | POA: Diagnosis not present

## 2016-03-26 DIAGNOSIS — G4733 Obstructive sleep apnea (adult) (pediatric): Secondary | ICD-10-CM | POA: Diagnosis not present

## 2016-03-28 ENCOUNTER — Other Ambulatory Visit: Payer: Self-pay | Admitting: Acute Care

## 2016-03-28 DIAGNOSIS — I10 Essential (primary) hypertension: Secondary | ICD-10-CM | POA: Diagnosis not present

## 2016-03-28 DIAGNOSIS — Z87891 Personal history of nicotine dependence: Secondary | ICD-10-CM | POA: Diagnosis not present

## 2016-03-28 DIAGNOSIS — J9621 Acute and chronic respiratory failure with hypoxia: Secondary | ICD-10-CM | POA: Diagnosis not present

## 2016-03-28 DIAGNOSIS — J9602 Acute respiratory failure with hypercapnia: Secondary | ICD-10-CM | POA: Diagnosis not present

## 2016-03-28 DIAGNOSIS — K219 Gastro-esophageal reflux disease without esophagitis: Secondary | ICD-10-CM | POA: Diagnosis not present

## 2016-03-28 DIAGNOSIS — J441 Chronic obstructive pulmonary disease with (acute) exacerbation: Secondary | ICD-10-CM | POA: Diagnosis not present

## 2016-03-30 DIAGNOSIS — J449 Chronic obstructive pulmonary disease, unspecified: Secondary | ICD-10-CM | POA: Diagnosis not present

## 2016-03-30 DIAGNOSIS — F17210 Nicotine dependence, cigarettes, uncomplicated: Secondary | ICD-10-CM | POA: Diagnosis not present

## 2016-03-30 DIAGNOSIS — F513 Sleepwalking [somnambulism]: Secondary | ICD-10-CM | POA: Diagnosis not present

## 2016-03-30 DIAGNOSIS — R918 Other nonspecific abnormal finding of lung field: Secondary | ICD-10-CM | POA: Diagnosis not present

## 2016-03-30 DIAGNOSIS — G4733 Obstructive sleep apnea (adult) (pediatric): Secondary | ICD-10-CM | POA: Diagnosis not present

## 2016-04-04 ENCOUNTER — Inpatient Hospital Stay: Payer: Medicare Other | Admitting: Adult Health

## 2016-04-28 ENCOUNTER — Ambulatory Visit (INDEPENDENT_AMBULATORY_CARE_PROVIDER_SITE_OTHER): Payer: Medicare Other | Admitting: Adult Health

## 2016-04-28 ENCOUNTER — Ambulatory Visit (INDEPENDENT_AMBULATORY_CARE_PROVIDER_SITE_OTHER)
Admission: RE | Admit: 2016-04-28 | Discharge: 2016-04-28 | Disposition: A | Discharge status: Home / Self Care | Payer: Medicare Other | Source: Ambulatory Visit | Attending: Adult Health | Admitting: Adult Health

## 2016-04-28 ENCOUNTER — Encounter: Payer: Self-pay | Admitting: Adult Health

## 2016-04-28 VITALS — BP 130/66 | HR 66 | Temp 97.9°F | Ht 60.0 in | Wt 170.0 lb

## 2016-04-28 DIAGNOSIS — J441 Chronic obstructive pulmonary disease with (acute) exacerbation: Secondary | ICD-10-CM

## 2016-04-28 DIAGNOSIS — I6529 Occlusion and stenosis of unspecified carotid artery: Secondary | ICD-10-CM

## 2016-04-28 DIAGNOSIS — J189 Pneumonia, unspecified organism: Secondary | ICD-10-CM

## 2016-04-28 DIAGNOSIS — J9611 Chronic respiratory failure with hypoxia: Secondary | ICD-10-CM | POA: Diagnosis not present

## 2016-04-28 IMAGING — DX DG CHEST 2V
2 series · 2 of 2 positions shown · non-contrast
Comparison: Two-view chest x-ray [DATE]

CLINICAL DATA: Follow-up previous pneumonia.

EXAM:
CHEST  2 VIEW

[chest pa]
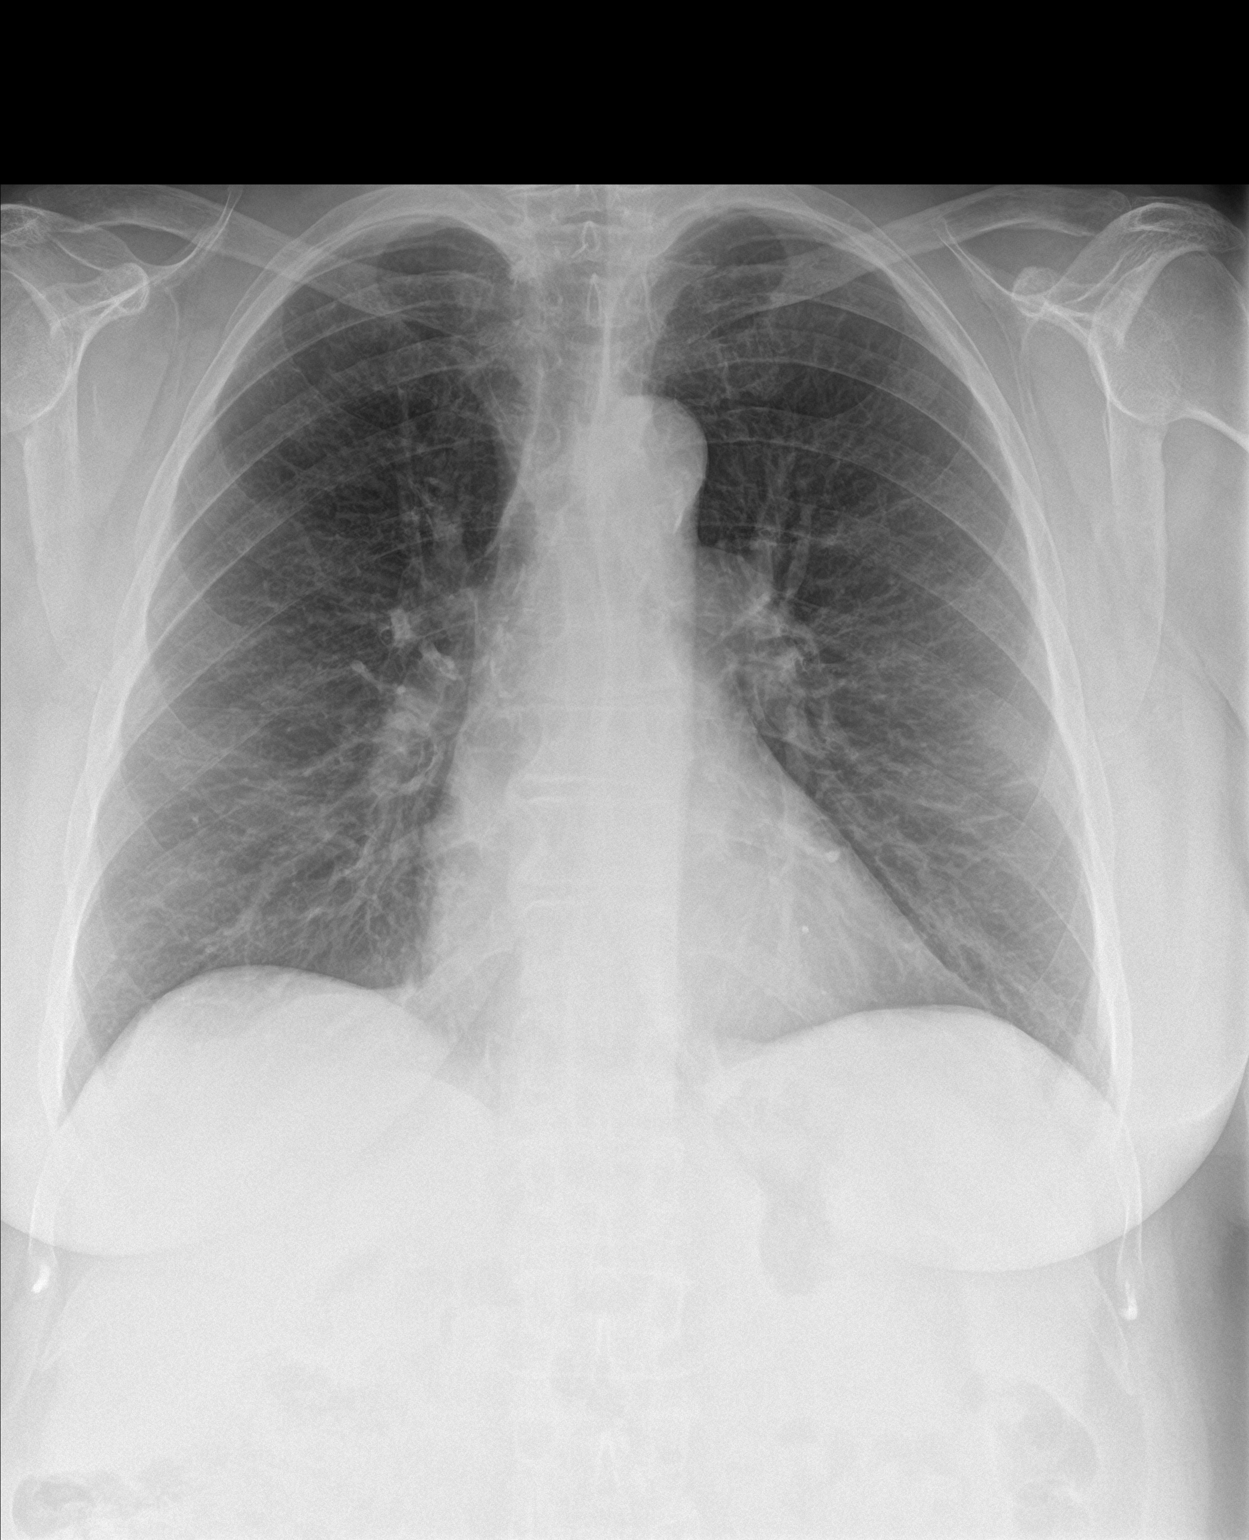

[chest lat]
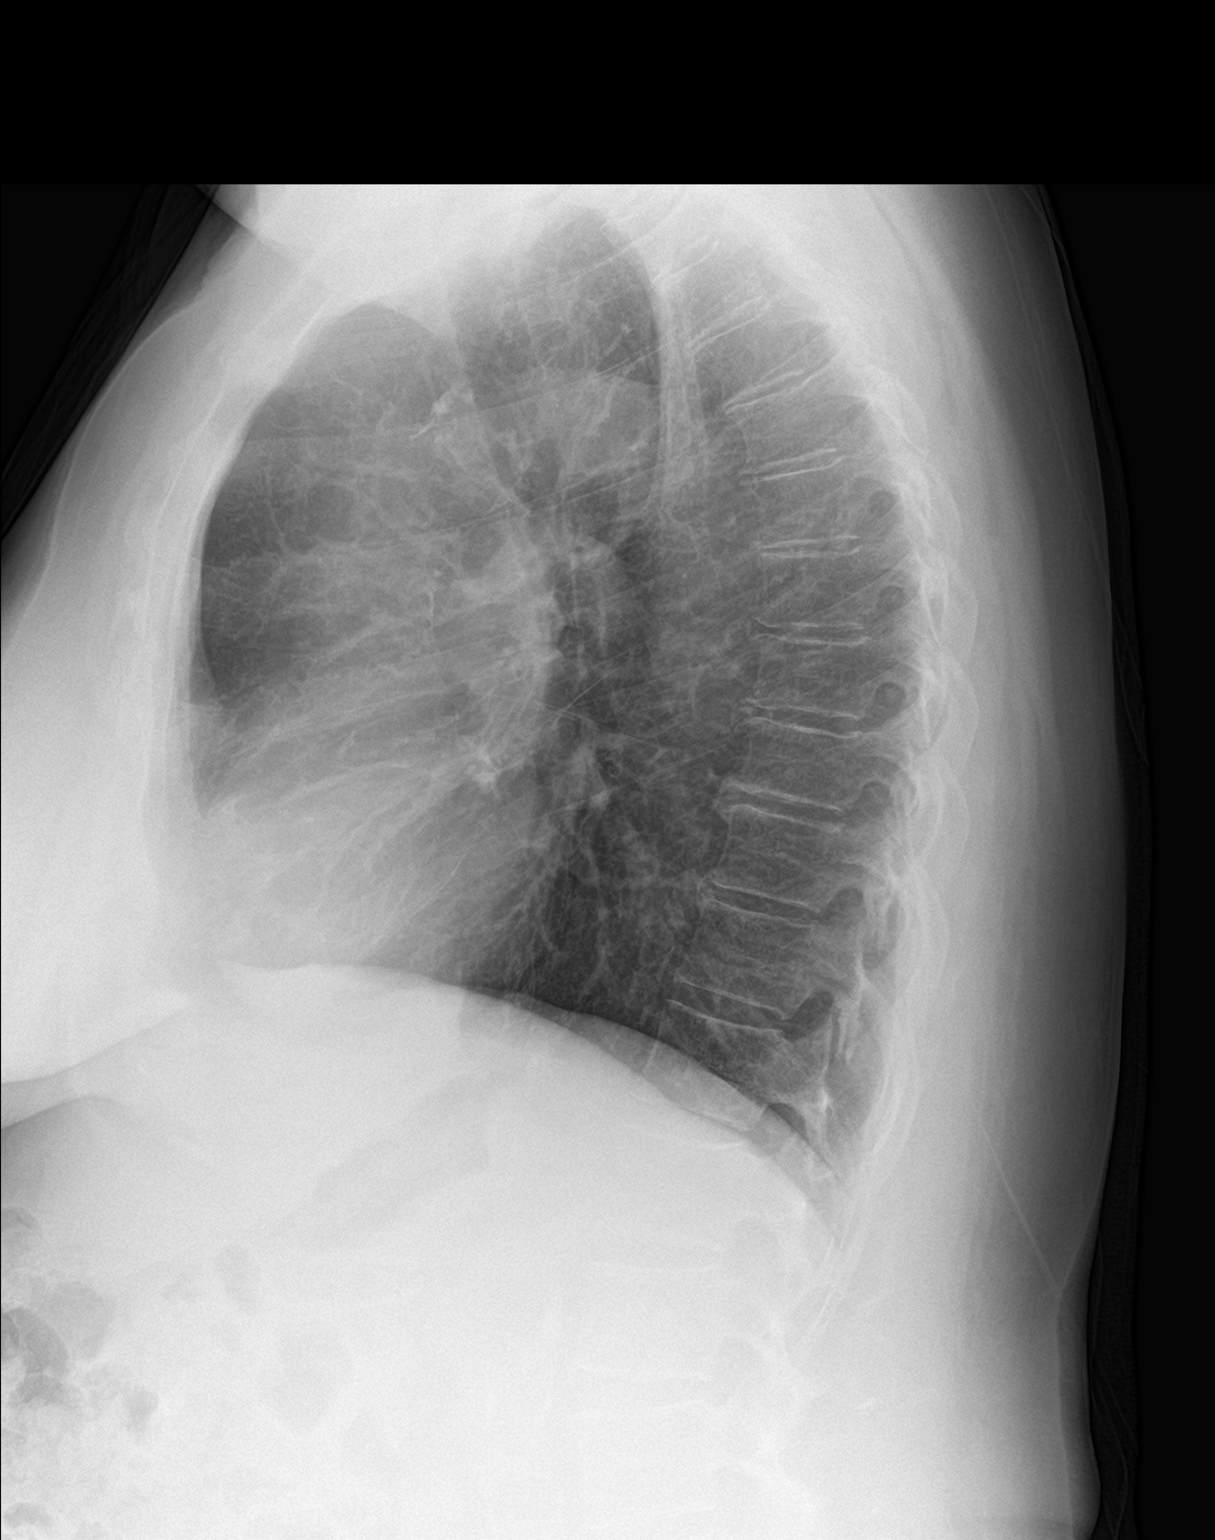

[2 of 2 positions shown; findings below may reference images not displayed]

FINDINGS: Heart size is normal. The lungs are clear. Chronic interstitial
coarsening is stable. The lungs are hyperinflated. Atherosclerotic
calcifications are again noted at the aortic arch. The visualized
soft tissues and bony thorax are unremarkable.
IMPRESSION: 1. No acute cardiopulmonary disease.
2. Emphysematous changes.
3. Atherosclerosis of the aortic arch.

## 2016-04-28 MED ORDER — ALBUTEROL SULFATE HFA 108 (90 BASE) MCG/ACT IN AERS: 2.0000 | INHALATION_SPRAY | RESPIRATORY_TRACT | Status: DC | PRN

## 2016-04-28 MED ORDER — TIOTROPIUM BROMIDE MONOHYDRATE 18 MCG IN CAPS: 18.0000 ug | ORAL_CAPSULE | Freq: Every day | RESPIRATORY_TRACT | Status: DC

## 2016-04-28 NOTE — Assessment & Plan Note (Signed)
Recent flare now resolved  Plan  Stop BREO  Continue on Symbicort 2 puffs Twice daily   Restart Spiriva handihaler 1 puff daily  Follow up with Dr. Lake Bells in 2-3 months and as needed

## 2016-04-28 NOTE — Addendum Note (Signed)
Addended by: Osa Craver on: 04/28/2016 05:02 PM   Modules accepted: Orders, Medications

## 2016-04-28 NOTE — Progress Notes (Signed)
Subjective:    Patient ID: Holly [name], female    DOB: [birth year], 70 y.o.   MRN: [id number]  HPI 70 yo female former smoker with COPD   Tests Tests PFT's 2011:  FEV1 0.84 (47%), ratio 43, no better p saba / +airtrapping, DLCO 52%. quit smoking 02/2015   Last pulmonary function tests done 11/11/2014 show very severe obstruction, with low vital capacity. FVC          Predicted: 2.38; Pre-actual: .98 Pre-% predicted: 41% FEV1                          1.85                     .40                             22% FEV1/FVC                  78%                     41%                          52% Vital Capacity             2.38   04/28/2016 Post Hospital follow up  Pt returns for a post hospital follow up  Recent admission to [facility] for COPD flare , tx w/ abx and steroids . Required BIPAP support.  She is using O2 2l/m with activity  And noninvasive ventilation. At bedtime  -this was started at discharge.  PCO2 was 72 on arrival .  She is feeling better. Started on BREO at discharge but still taking Symbciort . Likes symbicort better [employer].  PVX is utd.  Declines prevnar 13 today.  CXR today shows chronic changes.  Feeling some better, still weak. Regaining strength slowly.  Denies chest pain, orthopnea, edema or fever.    Past Medical History  Diagnosis Date  . Urinary incontinence   . Anxiety   . Cervicalgia   . Hypertension   . Hypercalcemia   . Mixed hyperlipidemia   . Basal cell carcinoma   . COPD (chronic obstructive pulmonary disease) ([place])   . Asthma   . 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 Bruises easily   . Frequency of urination   . Thyroid neoplasm    Current Outpatient Prescriptions on File Prior to Visit  Medication Sig Dispense Refill  . albuterol (PROVENTIL HFA;VENTOLIN HFA) 108 (90 BASE) MCG/ACT inhaler Inhale 2 puffs into the lungs every 4 (four) hours as needed for wheezing or shortness of breath.     Marland Kitchen aspirin 81 MG  tablet Take 81 mg by mouth daily.    . bisoprolol (ZEBETA) 10 MG tablet Take 1 tablet (10 mg total) by mouth every morning. 30 tablet 1  . budesonide-formoterol (SYMBICORT) 160-4.5 MCG/ACT inhaler Inhale 2 puffs into the lungs 2 (two) times daily. 10.2 g 5  . busPIRone (BUSPAR) 5 MG tablet Take 5 mg by mouth 2 (two) times daily.    Marland Kitchen levothyroxine (SYNTHROID, LEVOTHROID) 100 MCG tablet Once daily  0  . nystatin (MYCOSTATIN) 100000 UNIT/ML suspension Take 5 mLs (500,000 Units total) by mouth as needed. 60 mL 0  . olmesartan-hydrochlorothiazide (BENICAR HCT) 20-12.5  MG per tablet Take 1 tablet by mouth daily. 14 tablet 0  . omeprazole (PRILOSEC) 40 MG capsule TAKE 1 CAPSULE BY MOUTH DAILY 30 capsule 5  . Respiratory Therapy Supplies (FLUTTER) DEVI Use as directed 1 each 0  . tiotropium (SPIRIVA) 18 MCG inhalation capsule Place 1 capsule (18 mcg total) into inhaler and inhale daily. 30 capsule 5  . zolpidem (AMBIEN) 10 MG tablet Take 5 mg by mouth at bedtime as needed for sleep. Reported on 04/28/2016     No current facility-administered medications on file prior to visit.     Review of Systems Constitutional:   No  weight loss, night sweats,  Fevers, chills,  +fatigue, or  lassitude.  HEENT:   No headaches,  Difficulty swallowing,  Tooth/dental problems, or  Sore throat,                No sneezing, itching, ear ache, nasal congestion, post nasal drip,   CV:  No chest pain,  Orthopnea, PND, swelling in lower extremities, anasarca, dizziness, palpitations, syncope.   GI  No heartburn, indigestion, abdominal pain, nausea, vomiting, diarrhea, change in bowel habits, loss of appetite, bloody stools.   Resp:   Skin: no rash or lesions.  GU: no dysuria, change in color of urine, no urgency or frequency.  No flank pain, no hematuria   MS:  No joint pain or swelling.  No decreased range of motion.  No back pain.  Psych:  No change in mood or affect. No depression or anxiety.  No memory  loss.         Objective:   Physical Exam Filed Vitals:   04/28/16 1610  BP: 130/66  Pulse: 66  Temp: 97.9 F (36.6 C)  TempSrc: Oral  Height: 5' (1.524 m)  Weight: 170 lb (77.111 kg)  SpO2: 91%   GEN: A/Ox3; pleasant , NAD, elderly   HEENT:  [place]/AT,  EACs-clear, TMs-wnl, NOSE-clear, THROAT-clear, no lesions, no postnasal drip or exudate noted.   NECK:  Supple w/ fair ROM; no JVD; normal carotid impulses w/o bruits; no thyromegaly or nodules palpated; no lymphadenopathy.  RESP  Decreased BS in bases , no accessory muscle use, no dullness to percussion  CARD:  RRR, no m/r/g  , no peripheral edema, pulses intact, no cyanosis or clubbing.  GI:   Soft & nt; nml bowel sounds; no organomegaly or masses detected.  Musco: Warm bil, no deformities or joint swelling noted.   Neuro: alert, no focal deficits noted.    Skin: Warm, no lesions or rashes  Tammy Parrett NP-C  [facility]  04/28/2016

## 2016-04-28 NOTE — Patient Instructions (Addendum)
Stop BREO  Continue on Symbicort 2 puffs Twice daily   Restart Spiriva handihaler 1 puff daily  Follow up with Dr. Lake Bells in 2-3 months and as needed

## 2016-04-28 NOTE — Assessment & Plan Note (Signed)
Cont on O2 with act and noninvasive ventilation At bedtime  (for hypercarbia)

## 2016-05-02 NOTE — Progress Notes (Signed)
Reviewed, agree with this plan

## 2016-05-10 ENCOUNTER — Encounter: Payer: Self-pay | Admitting: Adult Health

## 2016-05-17 DIAGNOSIS — S81012A Laceration without foreign body, left knee, initial encounter: Secondary | ICD-10-CM | POA: Diagnosis not present

## 2016-05-17 DIAGNOSIS — L02415 Cutaneous abscess of right lower limb: Secondary | ICD-10-CM | POA: Diagnosis not present

## 2016-05-31 ENCOUNTER — Encounter: Payer: Self-pay | Admitting: Pulmonary Disease

## 2016-06-01 DIAGNOSIS — J449 Chronic obstructive pulmonary disease, unspecified: Secondary | ICD-10-CM | POA: Diagnosis not present

## 2016-06-01 DIAGNOSIS — F513 Sleepwalking [somnambulism]: Secondary | ICD-10-CM | POA: Diagnosis not present

## 2016-06-01 DIAGNOSIS — R918 Other nonspecific abnormal finding of lung field: Secondary | ICD-10-CM | POA: Diagnosis not present

## 2016-06-09 DIAGNOSIS — R5383 Other fatigue: Secondary | ICD-10-CM | POA: Diagnosis not present

## 2016-06-09 DIAGNOSIS — J441 Chronic obstructive pulmonary disease with (acute) exacerbation: Secondary | ICD-10-CM | POA: Diagnosis not present

## 2016-06-09 DIAGNOSIS — G4733 Obstructive sleep apnea (adult) (pediatric): Secondary | ICD-10-CM | POA: Diagnosis not present

## 2016-06-24 DIAGNOSIS — E890 Postprocedural hypothyroidism: Secondary | ICD-10-CM | POA: Diagnosis not present

## 2016-06-24 DIAGNOSIS — I10 Essential (primary) hypertension: Secondary | ICD-10-CM | POA: Diagnosis not present

## 2016-06-24 DIAGNOSIS — F17201 Nicotine dependence, unspecified, in remission: Secondary | ICD-10-CM | POA: Diagnosis not present

## 2016-06-24 DIAGNOSIS — J449 Chronic obstructive pulmonary disease, unspecified: Secondary | ICD-10-CM | POA: Diagnosis not present

## 2016-06-24 DIAGNOSIS — F419 Anxiety disorder, unspecified: Secondary | ICD-10-CM | POA: Diagnosis not present

## 2016-06-24 DIAGNOSIS — M7989 Other specified soft tissue disorders: Secondary | ICD-10-CM | POA: Diagnosis not present

## 2016-06-25 ENCOUNTER — Other Ambulatory Visit: Payer: Self-pay | Admitting: Family Medicine

## 2016-06-25 DIAGNOSIS — M7989 Other specified soft tissue disorders: Secondary | ICD-10-CM

## 2016-06-30 ENCOUNTER — Ambulatory Visit: Payer: Medicare Other | Admitting: Pulmonary Disease

## 2016-07-01 ENCOUNTER — Ambulatory Visit
Admission: RE | Admit: 2016-07-01 | Discharge: 2016-07-01 | Disposition: A | Discharge status: Home / Self Care | Payer: Medicare Other | Source: Ambulatory Visit | Attending: Family Medicine | Admitting: Family Medicine

## 2016-07-01 DIAGNOSIS — R600 Localized edema: Secondary | ICD-10-CM | POA: Diagnosis not present

## 2016-07-01 DIAGNOSIS — M7989 Other specified soft tissue disorders: Secondary | ICD-10-CM

## 2016-07-01 IMAGING — US US EXTREM LOW VENOUS*L*
1 series · 13 of 24 positions shown · non-contrast
Comparison: None.

CLINICAL DATA: Acute left lower extremity and foot edema and pain



[Series 1: us extrem low venous*left* · 13 of 40 slices shown]
[im 1/40]
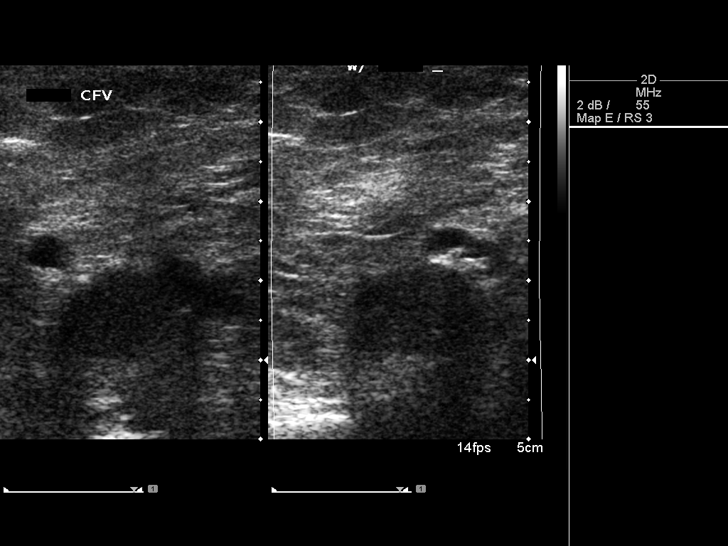
[im 4/40]
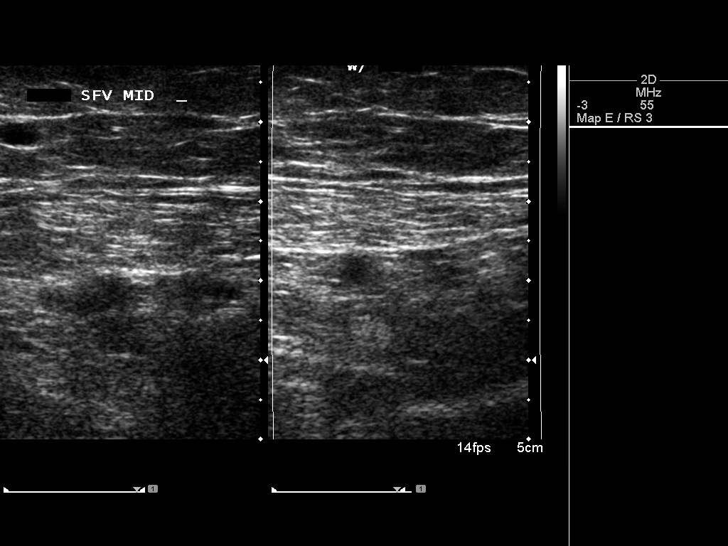
[im 7/40]
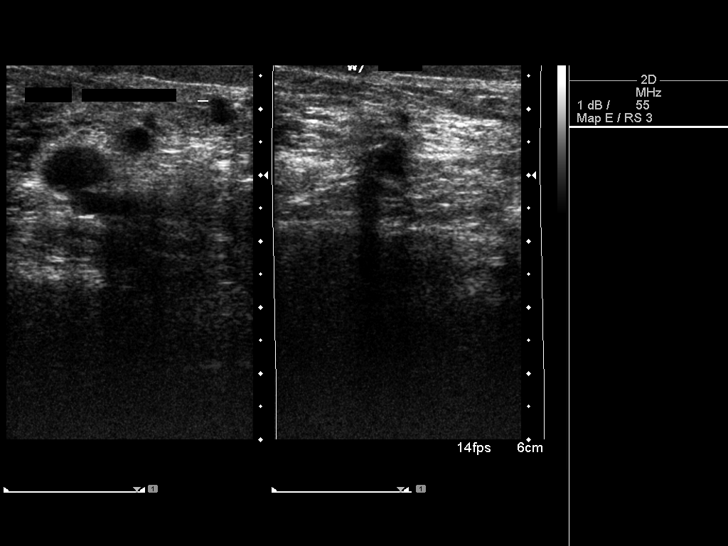
[im 11/40]
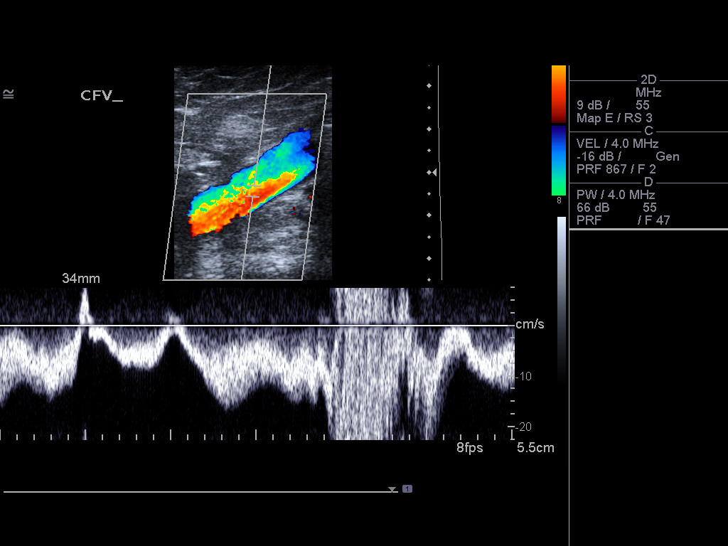
[im 14/40]
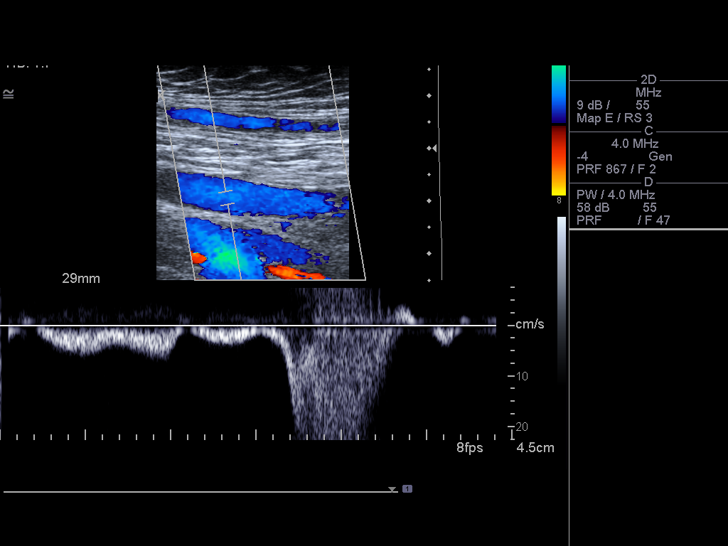
[im 17/40]
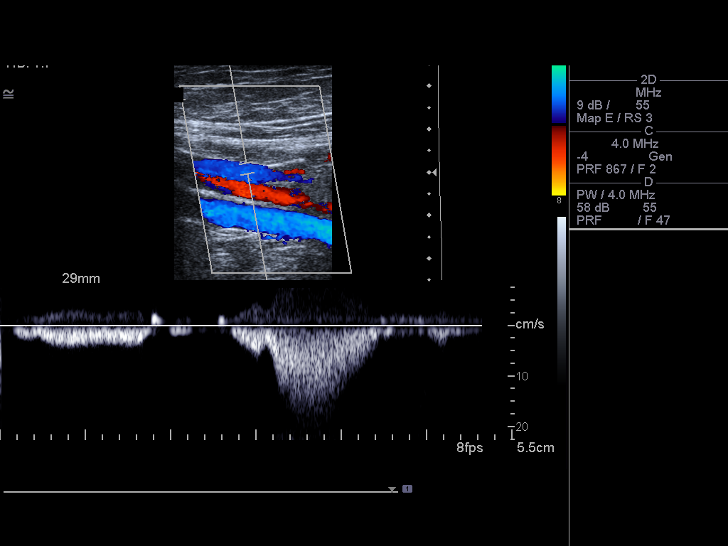
[im 21/40]
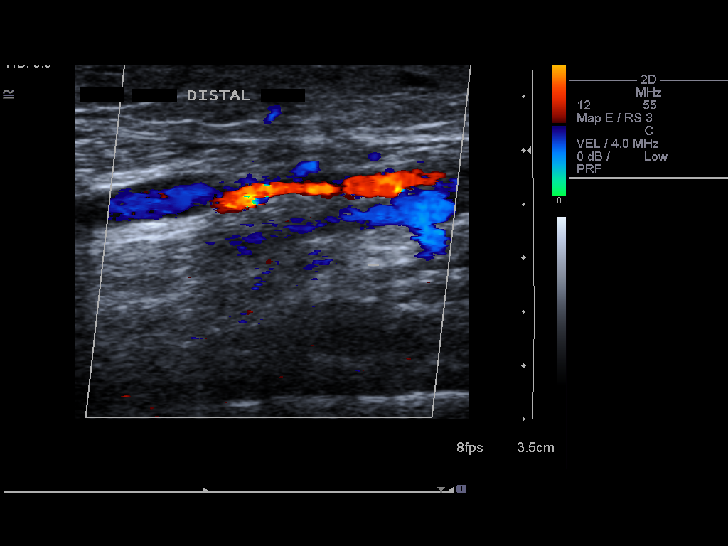
[im 23/40]
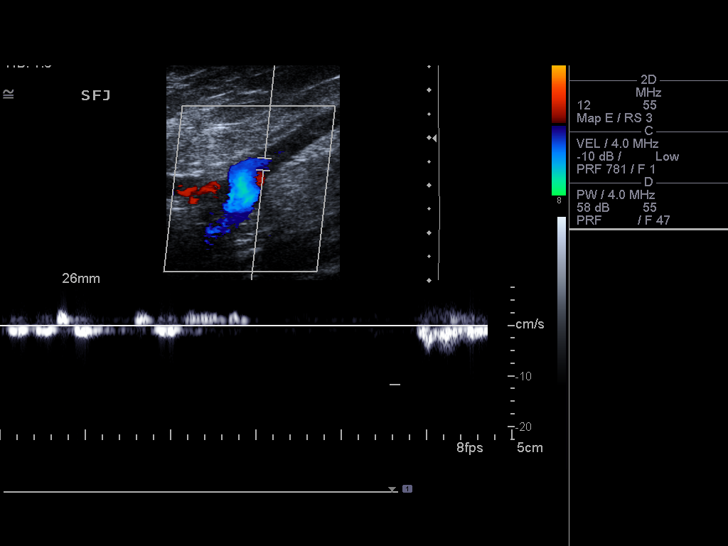
[im 26/40]
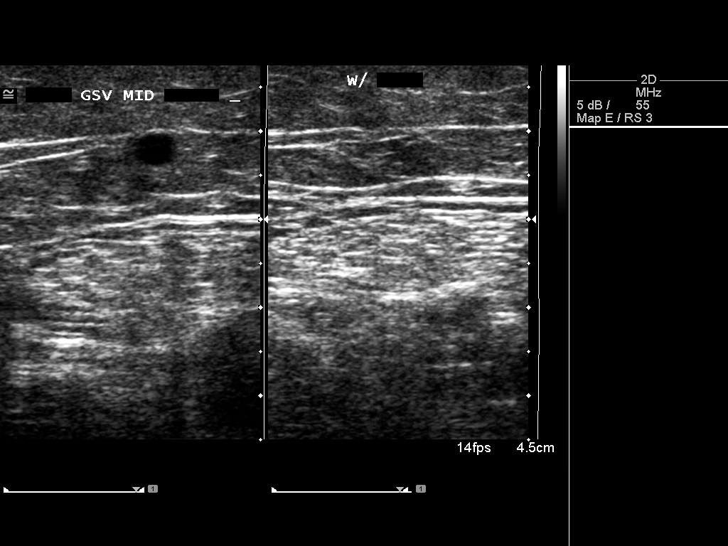
[im 29/40]
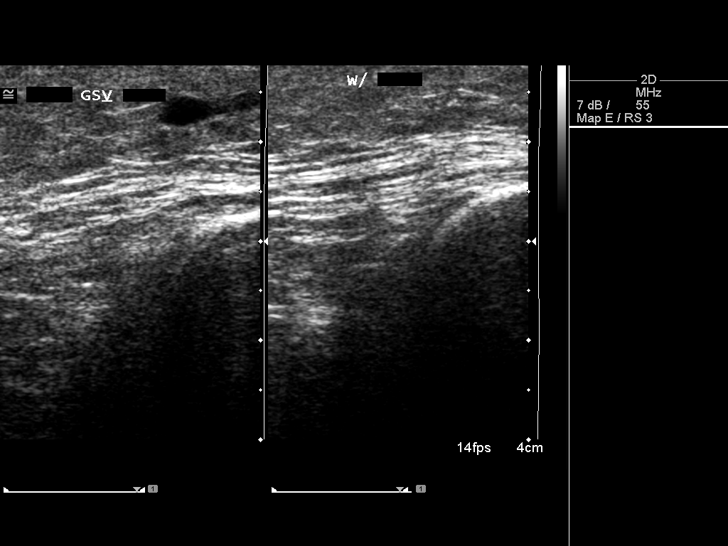
[im 33/40]
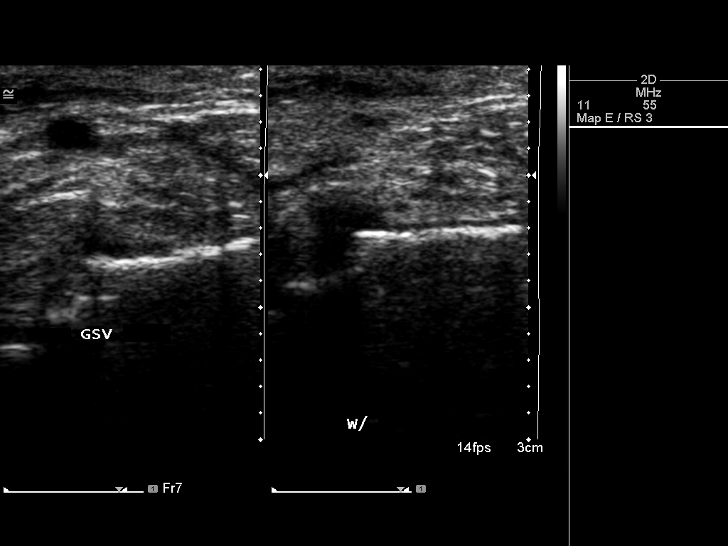
[im 36/40]
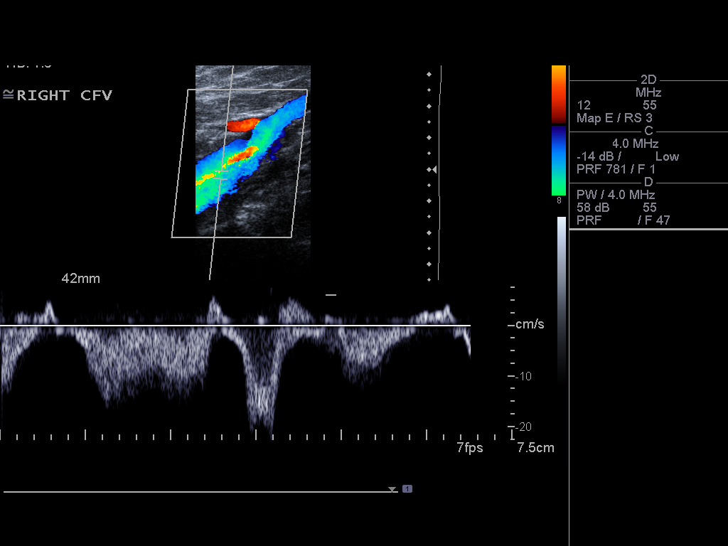
[im 40/40]
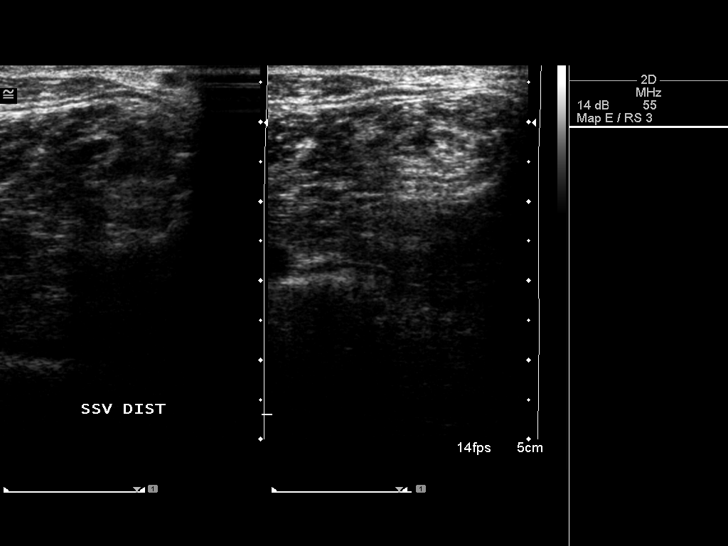

[13 of 24 positions shown; findings below may reference images not displayed]

FINDINGS: Contralateral Common Femoral Vein: Respiratory phasicity is normal
and symmetric with the symptomatic side. No evidence of thrombus.
Normal compressibility.

Common Femoral Vein: No evidence of thrombus. Normal
compressibility, respiratory phasicity and response to augmentation.

Saphenofemoral Junction: No evidence of thrombus. Normal
compressibility and flow on color Doppler imaging.

Profunda Femoral Vein: No evidence of thrombus. Normal
compressibility and flow on color Doppler imaging.

Femoral Vein: No evidence of thrombus. Normal compressibility,
respiratory phasicity and response to augmentation.

Popliteal Vein: No evidence of thrombus. Normal compressibility,
respiratory phasicity and response to augmentation.

Calf Veins: No evidence of thrombus. Normal compressibility and flow
on color Doppler imaging.

Superficial Great Saphenous Vein: No evidence of thrombus. Normal
compressibility and flow on color Doppler imaging.

Venous Reflux:  None.

Other Findings:  None.
IMPRESSION: No evidence of deep venous thrombosis.

## 2016-08-26 DIAGNOSIS — Z23 Encounter for immunization: Secondary | ICD-10-CM | POA: Diagnosis not present

## 2016-09-05 DIAGNOSIS — R918 Other nonspecific abnormal finding of lung field: Secondary | ICD-10-CM | POA: Diagnosis not present

## 2016-09-05 DIAGNOSIS — G4733 Obstructive sleep apnea (adult) (pediatric): Secondary | ICD-10-CM | POA: Diagnosis not present

## 2016-09-05 DIAGNOSIS — R5383 Other fatigue: Secondary | ICD-10-CM | POA: Diagnosis not present

## 2016-09-05 DIAGNOSIS — R609 Edema, unspecified: Secondary | ICD-10-CM | POA: Diagnosis not present

## 2016-11-05 ENCOUNTER — Other Ambulatory Visit: Payer: Self-pay | Admitting: Acute Care

## 2016-11-08 DIAGNOSIS — J449 Chronic obstructive pulmonary disease, unspecified: Secondary | ICD-10-CM | POA: Diagnosis not present

## 2016-11-08 DIAGNOSIS — R5383 Other fatigue: Secondary | ICD-10-CM | POA: Diagnosis not present

## 2016-11-08 DIAGNOSIS — R918 Other nonspecific abnormal finding of lung field: Secondary | ICD-10-CM | POA: Diagnosis not present

## 2017-01-19 ENCOUNTER — Other Ambulatory Visit: Payer: Self-pay | Admitting: Acute Care

## 2017-01-26 ENCOUNTER — Other Ambulatory Visit: Payer: Self-pay | Admitting: Acute Care

## 2017-01-30 DIAGNOSIS — R918 Other nonspecific abnormal finding of lung field: Secondary | ICD-10-CM | POA: Diagnosis not present

## 2017-01-30 DIAGNOSIS — R5383 Other fatigue: Secondary | ICD-10-CM | POA: Diagnosis not present

## 2017-01-30 DIAGNOSIS — J449 Chronic obstructive pulmonary disease, unspecified: Secondary | ICD-10-CM | POA: Diagnosis not present

## 2017-03-17 DIAGNOSIS — J449 Chronic obstructive pulmonary disease, unspecified: Secondary | ICD-10-CM | POA: Diagnosis not present

## 2017-03-17 DIAGNOSIS — R5383 Other fatigue: Secondary | ICD-10-CM | POA: Diagnosis not present

## 2017-03-17 DIAGNOSIS — R918 Other nonspecific abnormal finding of lung field: Secondary | ICD-10-CM | POA: Diagnosis not present

## 2017-03-17 DIAGNOSIS — G4733 Obstructive sleep apnea (adult) (pediatric): Secondary | ICD-10-CM | POA: Diagnosis not present

## 2017-03-30 DIAGNOSIS — G4733 Obstructive sleep apnea (adult) (pediatric): Secondary | ICD-10-CM | POA: Diagnosis not present

## 2017-03-30 DIAGNOSIS — J449 Chronic obstructive pulmonary disease, unspecified: Secondary | ICD-10-CM | POA: Diagnosis not present

## 2017-03-30 DIAGNOSIS — R5383 Other fatigue: Secondary | ICD-10-CM | POA: Diagnosis not present

## 2017-05-04 DIAGNOSIS — J449 Chronic obstructive pulmonary disease, unspecified: Secondary | ICD-10-CM | POA: Diagnosis not present

## 2017-05-05 DIAGNOSIS — J9611 Chronic respiratory failure with hypoxia: Secondary | ICD-10-CM | POA: Diagnosis not present

## 2017-05-05 DIAGNOSIS — F17201 Nicotine dependence, unspecified, in remission: Secondary | ICD-10-CM | POA: Diagnosis not present

## 2017-05-05 DIAGNOSIS — J449 Chronic obstructive pulmonary disease, unspecified: Secondary | ICD-10-CM | POA: Diagnosis not present

## 2017-05-05 DIAGNOSIS — I10 Essential (primary) hypertension: Secondary | ICD-10-CM | POA: Diagnosis not present

## 2017-05-05 DIAGNOSIS — F325 Major depressive disorder, single episode, in full remission: Secondary | ICD-10-CM | POA: Diagnosis not present

## 2017-05-05 DIAGNOSIS — F419 Anxiety disorder, unspecified: Secondary | ICD-10-CM | POA: Diagnosis not present

## 2017-05-05 DIAGNOSIS — E890 Postprocedural hypothyroidism: Secondary | ICD-10-CM | POA: Diagnosis not present

## 2017-05-10 DIAGNOSIS — J449 Chronic obstructive pulmonary disease, unspecified: Secondary | ICD-10-CM | POA: Diagnosis not present

## 2017-05-12 DIAGNOSIS — J449 Chronic obstructive pulmonary disease, unspecified: Secondary | ICD-10-CM | POA: Diagnosis not present

## 2017-05-15 DIAGNOSIS — J449 Chronic obstructive pulmonary disease, unspecified: Secondary | ICD-10-CM | POA: Diagnosis not present

## 2017-05-17 DIAGNOSIS — J449 Chronic obstructive pulmonary disease, unspecified: Secondary | ICD-10-CM | POA: Diagnosis not present

## 2017-05-24 DIAGNOSIS — J449 Chronic obstructive pulmonary disease, unspecified: Secondary | ICD-10-CM | POA: Diagnosis not present

## 2017-05-26 DIAGNOSIS — J449 Chronic obstructive pulmonary disease, unspecified: Secondary | ICD-10-CM | POA: Diagnosis not present

## 2017-05-29 ENCOUNTER — Other Ambulatory Visit: Payer: Self-pay | Admitting: Acute Care

## 2017-05-31 DIAGNOSIS — J449 Chronic obstructive pulmonary disease, unspecified: Secondary | ICD-10-CM | POA: Diagnosis not present

## 2017-06-05 DIAGNOSIS — J449 Chronic obstructive pulmonary disease, unspecified: Secondary | ICD-10-CM | POA: Diagnosis not present

## 2017-06-20 DIAGNOSIS — J449 Chronic obstructive pulmonary disease, unspecified: Secondary | ICD-10-CM | POA: Diagnosis not present

## 2017-06-23 DIAGNOSIS — J449 Chronic obstructive pulmonary disease, unspecified: Secondary | ICD-10-CM | POA: Diagnosis not present

## 2017-06-26 ENCOUNTER — Other Ambulatory Visit: Payer: Self-pay | Admitting: Adult Health

## 2017-06-27 ENCOUNTER — Other Ambulatory Visit: Payer: Self-pay | Admitting: Adult Health

## 2017-06-29 ENCOUNTER — Other Ambulatory Visit: Payer: Self-pay | Admitting: Adult Health

## 2017-07-05 DIAGNOSIS — J449 Chronic obstructive pulmonary disease, unspecified: Secondary | ICD-10-CM | POA: Diagnosis not present

## 2017-07-10 DIAGNOSIS — J449 Chronic obstructive pulmonary disease, unspecified: Secondary | ICD-10-CM | POA: Diagnosis not present

## 2017-07-21 DIAGNOSIS — E785 Hyperlipidemia, unspecified: Secondary | ICD-10-CM | POA: Diagnosis not present

## 2017-07-21 DIAGNOSIS — Z79899 Other long term (current) drug therapy: Secondary | ICD-10-CM | POA: Diagnosis not present

## 2017-08-31 DIAGNOSIS — G4733 Obstructive sleep apnea (adult) (pediatric): Secondary | ICD-10-CM | POA: Diagnosis not present

## 2017-08-31 DIAGNOSIS — R918 Other nonspecific abnormal finding of lung field: Secondary | ICD-10-CM | POA: Diagnosis not present

## 2017-08-31 DIAGNOSIS — J449 Chronic obstructive pulmonary disease, unspecified: Secondary | ICD-10-CM | POA: Diagnosis not present

## 2017-09-22 DIAGNOSIS — R5383 Other fatigue: Secondary | ICD-10-CM | POA: Diagnosis not present

## 2017-09-22 DIAGNOSIS — J449 Chronic obstructive pulmonary disease, unspecified: Secondary | ICD-10-CM | POA: Diagnosis not present

## 2017-09-22 DIAGNOSIS — R918 Other nonspecific abnormal finding of lung field: Secondary | ICD-10-CM | POA: Diagnosis not present

## 2017-10-18 DIAGNOSIS — R5383 Other fatigue: Secondary | ICD-10-CM | POA: Diagnosis not present

## 2017-10-18 DIAGNOSIS — G4733 Obstructive sleep apnea (adult) (pediatric): Secondary | ICD-10-CM | POA: Diagnosis not present

## 2017-10-18 DIAGNOSIS — J441 Chronic obstructive pulmonary disease with (acute) exacerbation: Secondary | ICD-10-CM | POA: Diagnosis not present

## 2017-11-03 DIAGNOSIS — J449 Chronic obstructive pulmonary disease, unspecified: Secondary | ICD-10-CM | POA: Diagnosis not present

## 2017-11-03 DIAGNOSIS — R5383 Other fatigue: Secondary | ICD-10-CM | POA: Diagnosis not present

## 2017-11-03 DIAGNOSIS — G4733 Obstructive sleep apnea (adult) (pediatric): Secondary | ICD-10-CM | POA: Diagnosis not present

## 2017-11-17 DIAGNOSIS — G4733 Obstructive sleep apnea (adult) (pediatric): Secondary | ICD-10-CM | POA: Diagnosis not present

## 2017-12-01 DIAGNOSIS — M25521 Pain in right elbow: Secondary | ICD-10-CM | POA: Diagnosis not present

## 2017-12-01 DIAGNOSIS — S52022A Displaced fracture of olecranon process without intraarticular extension of left ulna, initial encounter for closed fracture: Secondary | ICD-10-CM | POA: Diagnosis not present

## 2017-12-01 DIAGNOSIS — S52021A Displaced fracture of olecranon process without intraarticular extension of right ulna, initial encounter for closed fracture: Secondary | ICD-10-CM | POA: Diagnosis not present

## 2017-12-01 DIAGNOSIS — M25562 Pain in left knee: Secondary | ICD-10-CM | POA: Diagnosis not present

## 2017-12-01 DIAGNOSIS — S8992XA Unspecified injury of left lower leg, initial encounter: Secondary | ICD-10-CM | POA: Diagnosis not present

## 2017-12-01 DIAGNOSIS — S8002XA Contusion of left knee, initial encounter: Secondary | ICD-10-CM | POA: Diagnosis not present

## 2017-12-04 ENCOUNTER — Ambulatory Visit: Payer: Self-pay | Admitting: Student

## 2017-12-04 DIAGNOSIS — S52021A Displaced fracture of olecranon process without intraarticular extension of right ulna, initial encounter for closed fracture: Secondary | ICD-10-CM

## 2017-12-05 ENCOUNTER — Encounter (HOSPITAL_COMMUNITY): Payer: Self-pay | Admitting: *Deleted

## 2017-12-05 ENCOUNTER — Other Ambulatory Visit: Payer: Self-pay

## 2017-12-05 NOTE — H&P (Signed)
Orthopaedic Trauma Service (OTS) Consult   Patient ID: Holly [name] MRN: [id number] DOB/AGE: 1946-03-24 72 y.o.  Reason for surgery: Right olecranon fracture  HPI: Holly [name] is an 72 y.o. female who is RHD and tripped over her oxygen cords on 12/01/17. She was seen at [facility] and was found to have a displaced right olecranon fracture. She was referred to me clinic for definitive management. She is a Secondary school teacher and states that she is in good health. Last saw her pulmonary doctor about 1-2 months ago and her health has been stable. No new shortness of breath. No chest pain. Uses oxygen at night as needed.  Past Medical History:  Diagnosis Date  . Anxiety   . Asthma   . Basal cell carcinoma   . Bruises easily   . Cervicalgia   . 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 Dyspnea    uses O2 only when needed. Uses 2 L   . Frequency of urination   . 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 Hypercalcemia   . Hypertension   . Mixed hyperlipidemia   . Pneumonia   . Thyroid neoplasm   . Urinary incontinence     Past Surgical History:  Procedure Laterality Date  . COLONOSCOPY    . DENTAL SURGERY    . EYE SURGERY Right    cataract surgery  . FOOT SURGERY  30 YRS AGO   BILATERAL  . SKIN CANCER EXCISION    . THYROIDECTOMY N/A 07/31/2014   Procedure: TOTAL THYROIDECTOMY;  Surgeon: Armandina Gemma, MD;  Location: [facility] ORS;  Service: General;  Laterality: N/A;  . VAGINAL HYSTERECTOMY      Family History  Problem Relation Age of Onset  . Coronary artery disease Father   . Hypertension Father   . Asthma Father   . Allergies Father   . Hypertension Mother   . Lung cancer Mother     Social History:  reports that she quit smoking about 2 years ago. Her smoking use included cigarettes. She has a 30.00 pack-year smoking history. she has never used smokeless tobacco. She reports that she drinks alcohol. Her drug history is not on  file.  Allergies:  Allergies  Allergen Reactions  . Codeine Nausea And Vomiting    Medications:  No current facility-administered medications on file prior to encounter.    Current Outpatient Medications on File Prior to Encounter  Medication Sig Dispense Refill  . ALPRAZolam (XANAX) 0.25 MG tablet Take 0.25 mg by mouth at bedtime.     Marland Kitchen atorvastatin (LIPITOR) 20 MG tablet Take 20 mg by mouth daily at 6 PM.    . buPROPion (WELLBUTRIN XL) 300 MG 24 hr tablet Take 300 mg by mouth daily.    . busPIRone (BUSPAR) 30 MG tablet Take 30 mg by mouth 2 (two) times daily.    Marland Kitchen Dextromethorphan-Guaifenesin (MUCINEX FAST-MAX DM MAX) 5-100 MG/5ML LIQD Take 20 mLs by mouth 2 (two) times daily.    . Fluticasone-Umeclidin-Vilant (TRELEGY ELLIPTA) 100-62.5-25 MCG/INH AEPB Inhale 1 puff into the lungs daily.    Marland Kitchen levothyroxine (SYNTHROID, LEVOTHROID) 100 MCG tablet Take 100 mcg by mouth daily before breakfast. Once daily  0  . metoprolol succinate (TOPROL-XL) 50 MG 24 hr tablet Take 50 mg by mouth daily. Take with or immediately following a meal.    . nystatin (MYCOSTATIN) 100000 UNIT/ML suspension Take 5 mLs (500,000 Units total) by mouth  as needed. (Patient taking differently: Take 5 mLs by mouth as needed (thrush). ) 60 mL 0  . valsartan-hydrochlorothiazide (DIOVAN-HCT) 160-12.5 MG tablet Take 1 tablet by mouth daily.    . VENTOLIN HFA 108 (90 Base) MCG/ACT inhaler INHALE 2 PUFFS INTO THE LUNGS EVERY 4 HOURS AS NEEDED FOR WHEEZING ORSHORTNESS OF BREATH 8 g 0  . aspirin 81 MG tablet Take 81 mg by mouth daily.    . bisoprolol (ZEBETA) 10 MG tablet Take 1 tablet (10 mg total) by mouth every morning. (Patient not taking: Reported on 12/04/2017) 30 tablet 1  . budesonide-formoterol (SYMBICORT) 160-4.5 MCG/ACT inhaler Inhale 2 puffs into the lungs 2 (two) times daily. (Patient not taking: Reported on 12/04/2017) 10.2 g 5  . olmesartan-hydrochlorothiazide (BENICAR HCT) 20-12.5 MG per tablet Take 1 tablet by mouth  daily. (Patient not taking: Reported on 12/04/2017) 14 tablet 0  . omeprazole (PRILOSEC) 40 MG capsule TAKE 1 CAPSULE BY MOUTH DAILY (Patient not taking: Reported on 12/04/2017) 30 capsule 2  . Respiratory Therapy Supplies (FLUTTER) DEVI Use as directed 1 each 0  . tiotropium (SPIRIVA) 18 MCG inhalation capsule Place 1 capsule (18 mcg total) into inhaler and inhale daily. (Patient not taking: Reported on 12/04/2017) 30 capsule 5  . tiotropium (SPIRIVA) 18 MCG inhalation capsule Place 1 capsule (18 mcg total) into inhaler and inhale daily. (Patient not taking: Reported on 12/04/2017) 30 capsule 6     ROS: Constitutional: No fever or chills Vision: No changes in vision ENT: No difficulty swallowing CV: No chest pain Pulm: No SOB or wheezing GI: No nausea or vomiting GU: No urgency or inability to hold urine Skin: No poor wound healing Neurologic: No numbness or tingling Psychiatric: No depression or anxiety Heme: No bruising Allergic: No reaction to medications or food   Exam: There were no vitals taken for this visit. General:NAD Orientation:AAOx3 Mood and Affect: cooperative and pleasant Gait: Normal Coordination and balance: Normal  RUE: Splint in place, taken down. Skin closed with ecchymosis. Painful ROM, palpable defect. Motor and sensory intact to median, radial, and ulnar nerve. 2+radial pulse  LUE: Skin without lesions. No tenderness to palpation. Full painless ROM, full strength in each muscle groups without evidence of instability.   Medical Decision Making: Imaging: Displaced right olecranon fracture  Medical history and chart was reviewed  Assessment/Plan: 72 year old female RHD with history of COPD and asthma with right displaced olecranon fracture  Discussed need for ORIF for optimal function of arm. Risks and benefits discussed. Risks discussed included bleeding requiring blood transfusion, bleeding causing a hematoma, infection, malunion, nonunion, damage to  surrounding nerves and blood vessels, pain, hardware prominence or irritation, hardware failure, stiffness, post-traumatic arthritis, DVT/PE, compartment syndrome, and even death. She agrees to proceed. Will plan for discharge home same day from PACU.  Shona Needles, MD [facility] [phone] (phone)

## 2017-12-05 NOTE — Anesthesia Preprocedure Evaluation (Addendum)
Anesthesia Evaluation  Patient identified by MRN, date of birth, ID band Patient awake    Reviewed: Allergy & Precautions, H&P , NPO status , Patient's Chart, lab work & pertinent test results, reviewed documented beta blocker date and time   Airway Mallampati: II  TM Distance: >3 FB Neck ROM: Full    Dental no notable dental hx. (+) Upper Dentures, Lower Dentures, Dental Advisory Given   Pulmonary asthma , COPD,  COPD inhaler and oxygen dependent, former smoker,    Pulmonary exam normal breath sounds clear to auscultation       Cardiovascular Exercise Tolerance: Good hypertension, Pt. on medications and Pt. on home beta blockers  Rhythm:Regular Rate:Normal     Neuro/Psych Anxiety negative neurological ROS     GI/Hepatic negative GI ROS, Neg liver ROS,   Endo/Other  negative endocrine ROS  Renal/GU negative Renal ROS  negative genitourinary   Musculoskeletal   Abdominal   Peds  Hematology negative hematology ROS (+)   Anesthesia Other Findings   Reproductive/Obstetrics negative OB ROS                            Anesthesia Physical Anesthesia Plan  ASA: III  Anesthesia Plan: General   Post-op Pain Management:  Regional for Post-op pain   Induction: Intravenous  PONV Risk Score and Plan: 3 and Ondansetron, Dexamethasone and Treatment may vary due to age or medical condition  Airway Management Planned: LMA and Oral ETT  Additional Equipment:   Intra-op Plan:   Post-operative Plan: Extubation in OR  Informed Consent: I have reviewed the patients History and Physical, chart, labs and discussed the procedure including the risks, benefits and alternatives for the proposed anesthesia with the patient or authorized representative who has indicated his/her understanding and acceptance.   Dental advisory given  Plan Discussed with: CRNA  Anesthesia Plan Comments:         Anesthesia Quick Evaluation

## 2017-12-05 NOTE — Progress Notes (Signed)
Spoke with pt for pre-op call. Pt denies cardiac history or diabetes. Pt has COPD, emphysema and asthma. Uses O2 at 2 L prn. She states she was told she did not have Sleep Apnea, but she states she uses a CPAP at night.  Aspirin last dose - 12/04/17.

## 2017-12-06 ENCOUNTER — Encounter (HOSPITAL_COMMUNITY)
Admission: RE | Disposition: A | Discharge status: Home / Self Care | Payer: Self-pay | Source: Ambulatory Visit | Attending: Student

## 2017-12-06 ENCOUNTER — Encounter (HOSPITAL_COMMUNITY): Payer: Self-pay | Admitting: Surgery

## 2017-12-06 ENCOUNTER — Ambulatory Visit (HOSPITAL_COMMUNITY)
Admission: RE | Admit: 2017-12-06 | Discharge: 2017-12-06 | Disposition: A | Discharge status: Home / Self Care | Payer: Medicare Other | Source: Ambulatory Visit | Attending: Student | Admitting: Student

## 2017-12-06 ENCOUNTER — Ambulatory Visit (HOSPITAL_COMMUNITY): Payer: Medicare Other

## 2017-12-06 ENCOUNTER — Ambulatory Visit (HOSPITAL_COMMUNITY): Payer: Medicare Other | Admitting: Anesthesiology

## 2017-12-06 DIAGNOSIS — Z9981 Dependence on supplemental oxygen: Secondary | ICD-10-CM | POA: Diagnosis not present

## 2017-12-06 DIAGNOSIS — Z7951 Long term (current) use of inhaled steroids: Secondary | ICD-10-CM | POA: Diagnosis not present

## 2017-12-06 DIAGNOSIS — I10 Essential (primary) hypertension: Secondary | ICD-10-CM | POA: Diagnosis not present

## 2017-12-06 DIAGNOSIS — Z79899 Other long term (current) drug therapy: Secondary | ICD-10-CM | POA: Insufficient documentation

## 2017-12-06 DIAGNOSIS — E782 Mixed hyperlipidemia: Secondary | ICD-10-CM | POA: Diagnosis not present

## 2017-12-06 DIAGNOSIS — Z79890 Hormone replacement therapy (postmenopausal): Secondary | ICD-10-CM | POA: Insufficient documentation

## 2017-12-06 DIAGNOSIS — Z87891 Personal history of nicotine dependence: Secondary | ICD-10-CM | POA: Diagnosis not present

## 2017-12-06 DIAGNOSIS — S52091D Other fracture of upper end of right ulna, subsequent encounter for closed fracture with routine healing: Secondary | ICD-10-CM | POA: Diagnosis not present

## 2017-12-06 DIAGNOSIS — J439 Emphysema, unspecified: Secondary | ICD-10-CM | POA: Insufficient documentation

## 2017-12-06 DIAGNOSIS — S52021A Displaced fracture of olecranon process without intraarticular extension of right ulna, initial encounter for closed fracture: Secondary | ICD-10-CM | POA: Diagnosis not present

## 2017-12-06 DIAGNOSIS — F419 Anxiety disorder, unspecified: Secondary | ICD-10-CM | POA: Insufficient documentation

## 2017-12-06 DIAGNOSIS — W228XXA Striking against or struck by other objects, initial encounter: Secondary | ICD-10-CM | POA: Diagnosis not present

## 2017-12-06 DIAGNOSIS — Z7982 Long term (current) use of aspirin: Secondary | ICD-10-CM | POA: Insufficient documentation

## 2017-12-06 DIAGNOSIS — Z85820 Personal history of malignant melanoma of skin: Secondary | ICD-10-CM | POA: Diagnosis not present

## 2017-12-06 DIAGNOSIS — J449 Chronic obstructive pulmonary disease, unspecified: Secondary | ICD-10-CM | POA: Diagnosis not present

## 2017-12-06 DIAGNOSIS — Z419 Encounter for procedure for purposes other than remedying health state, unspecified: Secondary | ICD-10-CM

## 2017-12-06 DIAGNOSIS — G8918 Other acute postprocedural pain: Secondary | ICD-10-CM | POA: Diagnosis not present

## 2017-12-06 HISTORY — PX: ORIF ELBOW FRACTURE: SHX5031

## 2017-12-06 HISTORY — DX: Dyspnea, unspecified: R06.00

## 2017-12-06 HISTORY — DX: Pneumonia, unspecified organism: J18.9

## 2017-12-06 LAB — BASIC METABOLIC PANEL
Anion gap: 12 (ref 5–15)
BUN: 9 mg/dL (ref 6–20)
CO2: 29 mmol/L (ref 22–32)
Calcium: 10.1 mg/dL (ref 8.9–10.3)
Chloride: 99 mmol/L — ABNORMAL LOW (ref 101–111)
Creatinine, Ser: 1.01 mg/dL — ABNORMAL HIGH (ref 0.44–1.00)
GFR calc Af Amer: >60 mL/min (ref >60)
GFR calc non Af Amer: 54 mL/min — ABNORMAL LOW (ref >60)
Glucose, Bld: 124 mg/dL — ABNORMAL HIGH (ref 65–99)
Potassium: 3.8 mmol/L (ref 3.5–5.1)
Sodium: 140 mmol/L (ref 135–145)

## 2017-12-06 LAB — CBC
HCT: 39.4 % (ref 36.0–46.0)
Hemoglobin: 12.3 g/dL (ref 12.0–15.0)
MCH: 30.7 pg (ref 26.0–34.0)
MCHC: 31.2 g/dL (ref 30.0–36.0)
MCV: 98.3 fL (ref 78.0–100.0)
Platelets: 176 10*3/uL (ref 150–400)
RBC: 4.01 MIL/uL (ref 3.87–5.11)
RDW: 15.8 % — ABNORMAL HIGH (ref 11.5–15.5)
WBC: 7 10*3/uL (ref 4.0–10.5)

## 2017-12-06 IMAGING — RF DG ELBOW 2V*R*
1 series · 4 of 4 positions shown · non-contrast
Comparison: None.

CLINICAL DATA: ORIF.

EXAM:
DG C-ARM 61-120 MIN; RIGHT ELBOW - 2 VIEW

[Series 1: run · 4 of 4 slices shown]
[im 1/4]
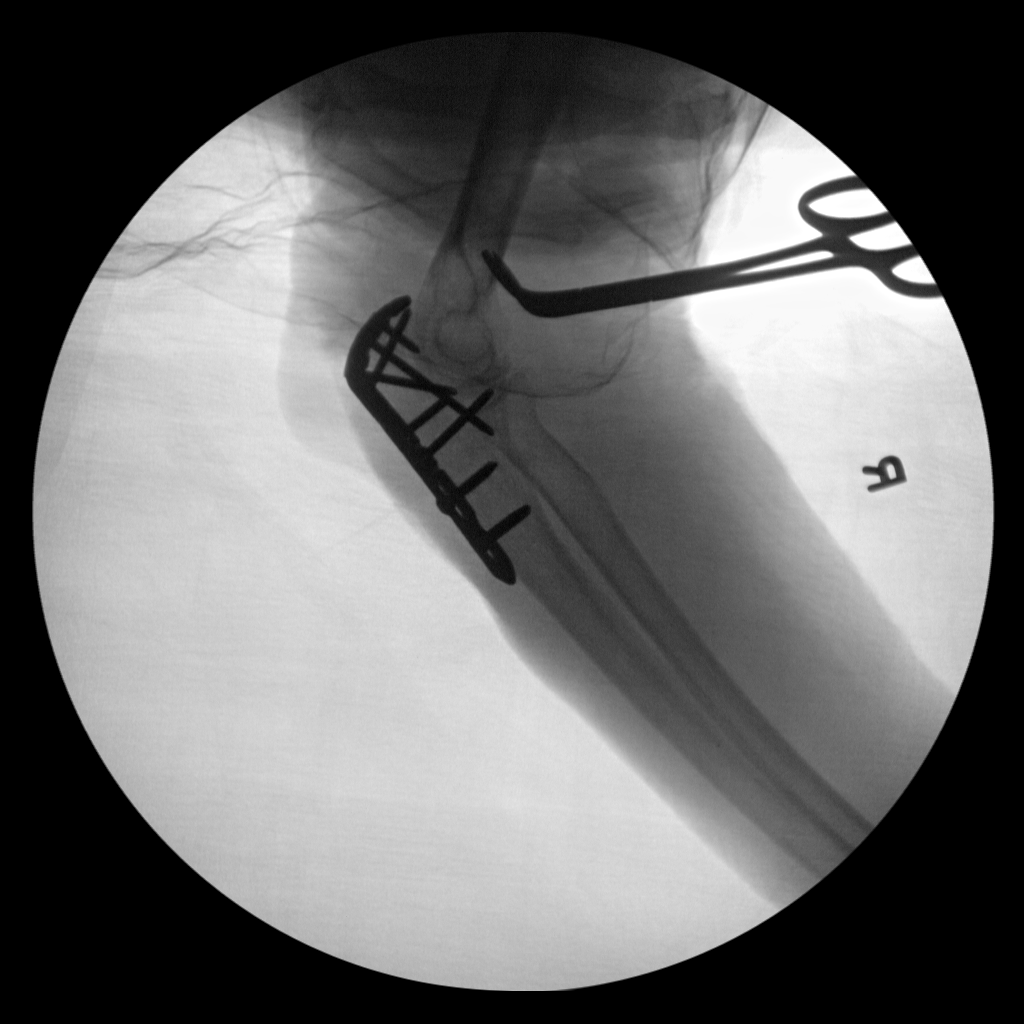
[im 2/4]
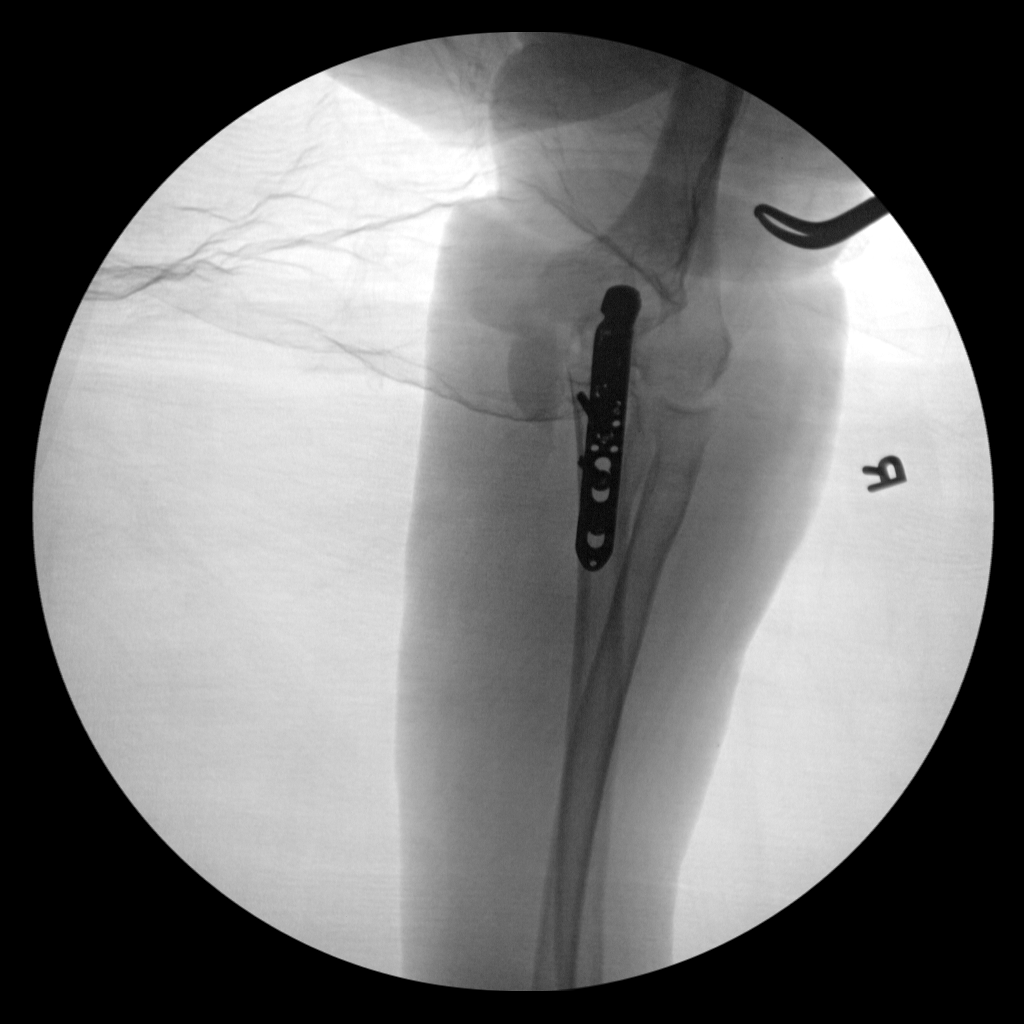
[im 3/4]
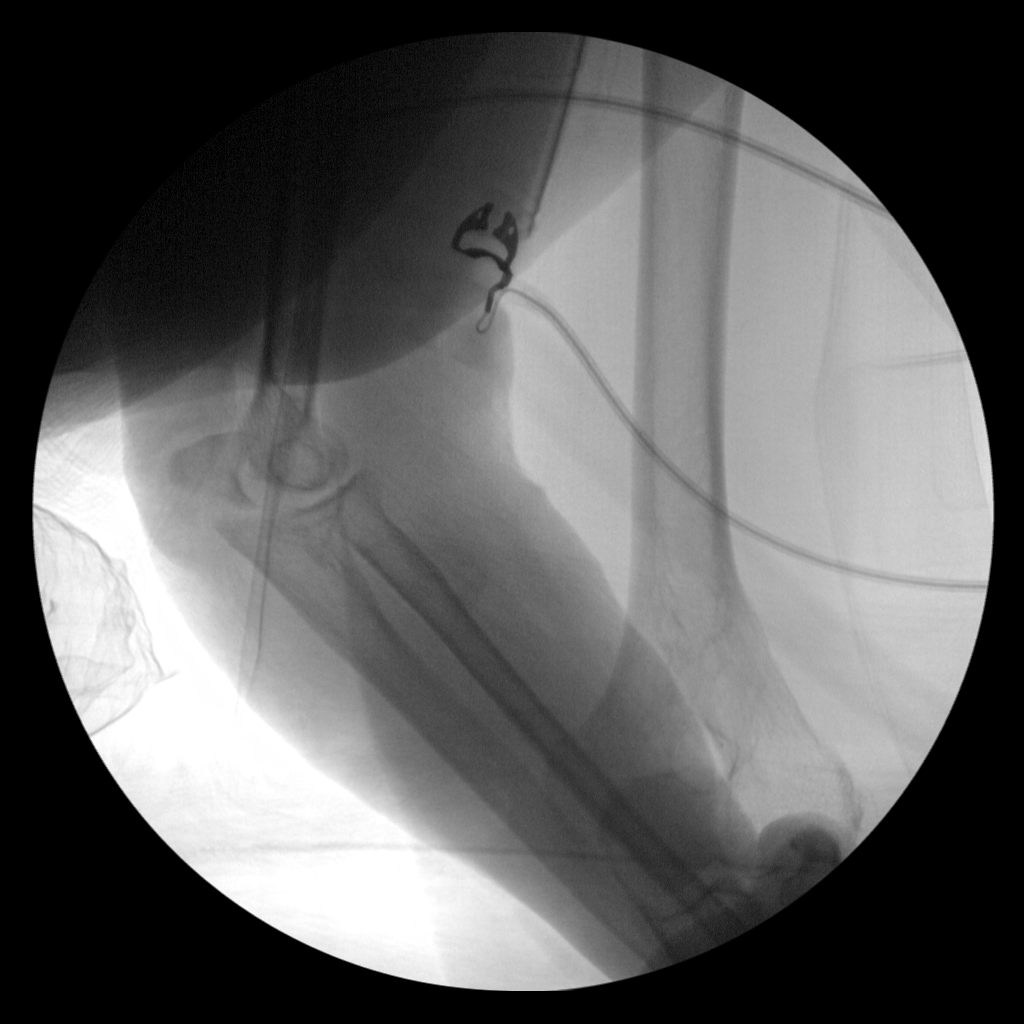
[im 4/4]
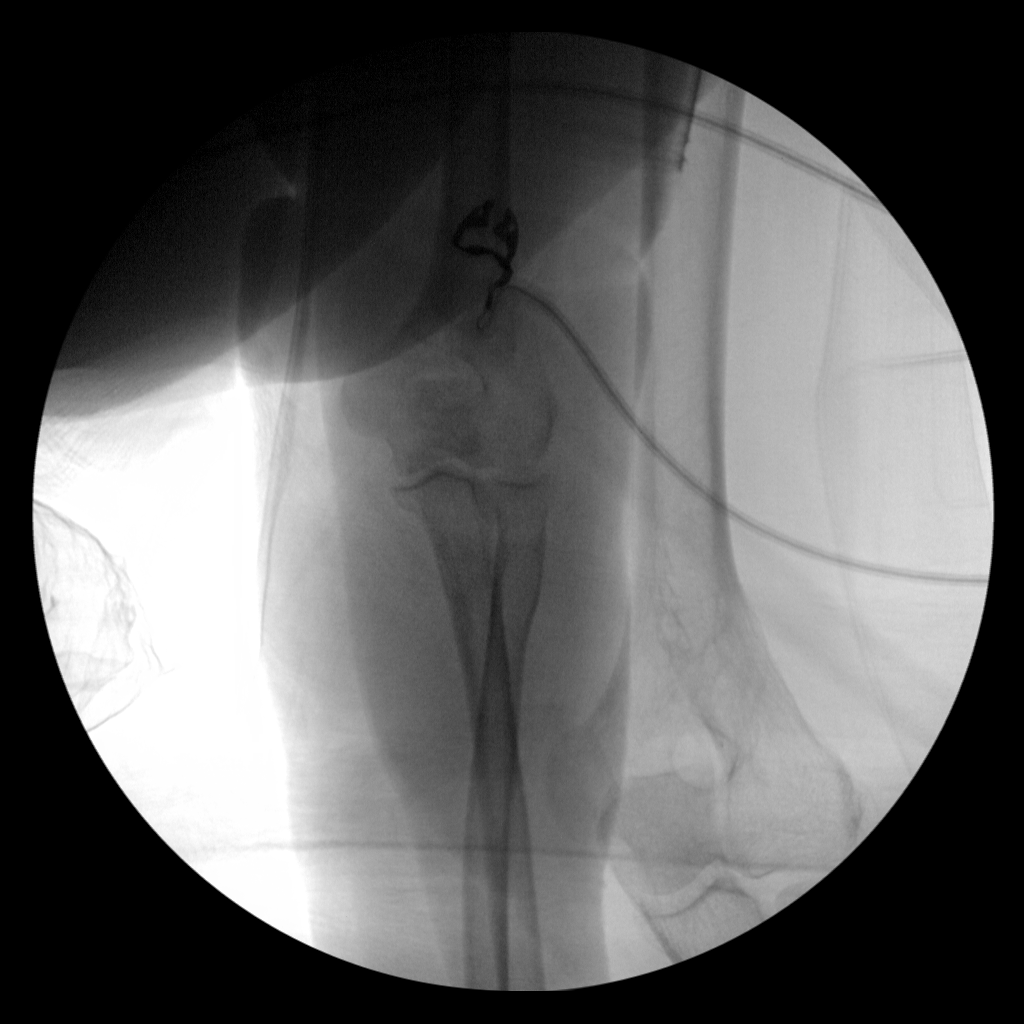

[4 of 4 positions shown; findings below may reference images not displayed]

FINDINGS: 4 intraoperative spot fluoro film show assessment during ORIF for
proximal ulnar fracture. Plate and screw fixation with marked
improvement in bony alignment of fracture anatomy postreduction.
IMPRESSION: Status post ORIF for proximal ulnar fracture without evidence for
immediate hardware complications.

## 2017-12-06 SURGERY — OPEN REDUCTION INTERNAL FIXATION (ORIF) ELBOW/OLECRANON FRACTURE
Anesthesia: General | Laterality: Right

## 2017-12-06 MED ORDER — MIDAZOLAM HCL 5 MG/5ML IJ SOLN
INTRAMUSCULAR | Status: DC | PRN
  Administered 2017-12-06: 1 mg via INTRAVENOUS

## 2017-12-06 MED ORDER — CEFAZOLIN SODIUM-DEXTROSE 2-4 GM/100ML-% IV SOLN
INTRAVENOUS | Status: AC
  Filled 2017-12-06: qty 100

## 2017-12-06 MED ORDER — PROPOFOL 10 MG/ML IV BOLUS
INTRAVENOUS | Status: AC
  Filled 2017-12-06: qty 20

## 2017-12-06 MED ORDER — CEFAZOLIN SODIUM-DEXTROSE 2-4 GM/100ML-% IV SOLN
2.0000 g | INTRAVENOUS | Status: AC
  Administered 2017-12-06: 2 g via INTRAVENOUS

## 2017-12-06 MED ORDER — ONDANSETRON HCL 4 MG/2ML IJ SOLN
INTRAMUSCULAR | Status: DC | PRN
  Administered 2017-12-06: 4 mg via INTRAVENOUS

## 2017-12-06 MED ORDER — LACTATED RINGERS IV SOLN
INTRAVENOUS | Status: DC | PRN
  Administered 2017-12-06 (×2): via INTRAVENOUS

## 2017-12-06 MED ORDER — EPHEDRINE SULFATE 50 MG/ML IJ SOLN
INTRAMUSCULAR | Status: DC | PRN
  Administered 2017-12-06 (×3): 10 mg via INTRAVENOUS

## 2017-12-06 MED ORDER — BUPIVACAINE-EPINEPHRINE (PF) 0.5% -1:200000 IJ SOLN
INTRAMUSCULAR | Status: DC | PRN
  Administered 2017-12-06: 30 mL via PERINEURAL

## 2017-12-06 MED ORDER — CHLORHEXIDINE GLUCONATE 4 % EX LIQD: 60.0000 mL | Freq: Once | CUTANEOUS | Status: DC

## 2017-12-06 MED ORDER — FENTANYL CITRATE (PF) 100 MCG/2ML IJ SOLN
INTRAMUSCULAR | Status: DC | PRN
  Administered 2017-12-06: 50 ug via INTRAVENOUS

## 2017-12-06 MED ORDER — BACITRACIN ZINC 500 UNIT/GM EX OINT
TOPICAL_OINTMENT | CUTANEOUS | Status: DC | PRN
  Administered 2017-12-06: 1 via TOPICAL

## 2017-12-06 MED ORDER — DEXAMETHASONE SODIUM PHOSPHATE 10 MG/ML IJ SOLN
INTRAMUSCULAR | Status: DC | PRN
  Administered 2017-12-06: 8 mg via INTRAVENOUS

## 2017-12-06 MED ORDER — ROCURONIUM BROMIDE 100 MG/10ML IV SOLN
INTRAVENOUS | Status: DC | PRN
  Administered 2017-12-06: 50 mg via INTRAVENOUS

## 2017-12-06 MED ORDER — VANCOMYCIN HCL 1000 MG IV SOLR
INTRAVENOUS | Status: DC | PRN
  Administered 2017-12-06: 1000 mg

## 2017-12-06 MED ORDER — FENTANYL CITRATE (PF) 250 MCG/5ML IJ SOLN
INTRAMUSCULAR | Status: AC
  Filled 2017-12-06: qty 5

## 2017-12-06 MED ORDER — MIDAZOLAM HCL 2 MG/2ML IJ SOLN
INTRAMUSCULAR | Status: AC
  Filled 2017-12-06: qty 2

## 2017-12-06 MED ORDER — ONDANSETRON HCL 4 MG/2ML IJ SOLN
INTRAMUSCULAR | Status: AC
  Filled 2017-12-06: qty 2

## 2017-12-06 MED ORDER — SUGAMMADEX SODIUM 200 MG/2ML IV SOLN
INTRAVENOUS | Status: DC | PRN
  Administered 2017-12-06: 200 mg via INTRAVENOUS

## 2017-12-06 MED ORDER — 0.9 % SODIUM CHLORIDE (POUR BTL) OPTIME
TOPICAL | Status: DC | PRN
  Administered 2017-12-06: 1000 mL

## 2017-12-06 MED ORDER — PROPOFOL 10 MG/ML IV BOLUS
INTRAVENOUS | Status: DC | PRN
  Administered 2017-12-06: 100 mg via INTRAVENOUS

## 2017-12-06 MED ORDER — PHENYLEPHRINE 40 MCG/ML (10ML) SYRINGE FOR IV PUSH (FOR BLOOD PRESSURE SUPPORT)
PREFILLED_SYRINGE | INTRAVENOUS | Status: AC
  Filled 2017-12-06: qty 10

## 2017-12-06 MED ORDER — VANCOMYCIN HCL 1000 MG IV SOLR
INTRAVENOUS | Status: AC
  Filled 2017-12-06: qty 1000

## 2017-12-06 MED ORDER — BACITRACIN ZINC 500 UNIT/GM EX OINT
TOPICAL_OINTMENT | CUTANEOUS | Status: AC
  Filled 2017-12-06: qty 28.35

## 2017-12-06 SURGICAL SUPPLY — 82 items
APL SKNCLS STERI-STRIP NONHPOA (GAUZE/BANDAGES/DRESSINGS)
BANDAGE ACE 3X5.8 VEL STRL LF (GAUZE/BANDAGES/DRESSINGS) ×2 IMPLANT
BANDAGE ACE 4X5 VEL STRL LF (GAUZE/BANDAGES/DRESSINGS) ×2 IMPLANT
BANDAGE ACE 6X5 VEL STRL LF (GAUZE/BANDAGES/DRESSINGS) ×2 IMPLANT
BENZOIN TINCTURE PRP APPL 2/3 (GAUZE/BANDAGES/DRESSINGS) IMPLANT
BIT DRILL LCP QC 2X140 (BIT) ×1 IMPLANT
BLADE AVERAGE 25X9 (BLADE) ×2 IMPLANT
BLADE CLIPPER SURG (BLADE) ×2 IMPLANT
BLADE SURG 10 STRL SS (BLADE) IMPLANT
BNDG CMPR 9X4 STRL LF SNTH (GAUZE/BANDAGES/DRESSINGS) ×1
BNDG COHESIVE 4X5 TAN STRL (GAUZE/BANDAGES/DRESSINGS) ×2 IMPLANT
BNDG ESMARK 4X9 LF (GAUZE/BANDAGES/DRESSINGS) ×2 IMPLANT
BNDG GAUZE ELAST 4 BULKY (GAUZE/BANDAGES/DRESSINGS) ×2 IMPLANT
BRUSH SCRUB SURG 4.25 DISP (MISCELLANEOUS) ×4 IMPLANT
CHLORAPREP W/TINT 26ML (MISCELLANEOUS) ×2 IMPLANT
CLEANER TIP ELECTROSURG 2X2 (MISCELLANEOUS) ×2 IMPLANT
COVER SURGICAL LIGHT HANDLE (MISCELLANEOUS) ×4 IMPLANT
CUFF TOURNIQUET SINGLE 18IN (TOURNIQUET CUFF) ×1 IMPLANT
CUFF TOURNIQUET SINGLE 24IN (TOURNIQUET CUFF) IMPLANT
DECANTER SPIKE VIAL GLASS SM (MISCELLANEOUS) IMPLANT
DRAPE C-ARM 42X72 X-RAY (DRAPES) ×1 IMPLANT
DRAPE C-ARMOR (DRAPES) ×2 IMPLANT
DRAPE INCISE IOBAN 66X45 STRL (DRAPES) IMPLANT
DRAPE U-SHAPE 47X51 STRL (DRAPES) ×2 IMPLANT
DRSG ADAPTIC 3X8 NADH LF (GAUZE/BANDAGES/DRESSINGS) ×1 IMPLANT
DRSG EMULSION OIL 3X3 NADH (GAUZE/BANDAGES/DRESSINGS) IMPLANT
ELECT REM PT RETURN 9FT ADLT (ELECTROSURGICAL) ×2
ELECTRODE REM PT RTRN 9FT ADLT (ELECTROSURGICAL) ×1 IMPLANT
FACESHIELD WRAPAROUND (MASK) IMPLANT
FACESHIELD WRAPAROUND OR TEAM (MASK) IMPLANT
GAUZE SPONGE 4X4 12PLY STRL (GAUZE/BANDAGES/DRESSINGS) ×2 IMPLANT
GAUZE XEROFORM 1X8 LF (GAUZE/BANDAGES/DRESSINGS) ×2 IMPLANT
GAUZE XEROFORM 5X9 LF (GAUZE/BANDAGES/DRESSINGS) ×2 IMPLANT
GLOVE BIO SURGEON STRL SZ7.5 (GLOVE) ×8 IMPLANT
GLOVE BIOGEL PI IND STRL 6.5 (GLOVE) IMPLANT
GLOVE BIOGEL PI IND STRL 7.5 (GLOVE) ×1 IMPLANT
GLOVE BIOGEL PI INDICATOR 6.5 (GLOVE) ×1
GLOVE BIOGEL PI INDICATOR 7.5 (GLOVE) ×1
GLOVE SURG SS PI 6.5 STRL IVOR (GLOVE) ×1 IMPLANT
GOWN STRL REUS W/ TWL LRG LVL3 (GOWN DISPOSABLE) ×2 IMPLANT
GOWN STRL REUS W/TWL LRG LVL3 (GOWN DISPOSABLE) ×4
KIT BASIN OR (CUSTOM PROCEDURE TRAY) ×2 IMPLANT
KIT ROOM TURNOVER OR (KITS) ×2 IMPLANT
MANIFOLD NEPTUNE II (INSTRUMENTS) ×1 IMPLANT
NS IRRIG 1000ML POUR BTL (IV SOLUTION) ×2 IMPLANT
PACK ORTHO EXTREMITY (CUSTOM PROCEDURE TRAY) ×2 IMPLANT
PAD ARMBOARD 7.5X6 YLW CONV (MISCELLANEOUS) ×4 IMPLANT
PAD CAST 4YDX4 CTTN HI CHSV (CAST SUPPLIES) ×1 IMPLANT
PADDING CAST COTTON 4X4 STRL (CAST SUPPLIES) ×4
PLATE OLECRANON PROX 2.7/3.5 (Plate) ×1 IMPLANT
SCREW LOCK T8 24X2.7XSTVA (Screw) IMPLANT
SCREW LOCK VA ST 2.7X14 (Screw) ×1 IMPLANT
SCREW LOCK VA ST 2.7X18 (Screw) ×1 IMPLANT
SCREW LOCKING 2.7X24MM (Screw) ×2 IMPLANT
SCREW LOCKING 2.7X28 (Screw) ×1 IMPLANT
SCREW LOCKING VA 2.7X30MM (Screw) ×1 IMPLANT
SCREW LOCKING VA 2.7X38 (Screw) ×1 IMPLANT
SCREW LOCKING VA 2.7X40MM (Screw) ×1 IMPLANT
SPONGE LAP 18X18 X RAY DECT (DISPOSABLE) ×4 IMPLANT
STAPLER VISISTAT 35W (STAPLE) ×2 IMPLANT
STRIP CLOSURE SKIN 1/2X4 (GAUZE/BANDAGES/DRESSINGS) IMPLANT
SUCTION FRAZIER HANDLE 10FR (MISCELLANEOUS) ×1
SUCTION TUBE FRAZIER 10FR DISP (MISCELLANEOUS) ×1 IMPLANT
SUT BONE WAX W31G (SUTURE) ×1 IMPLANT
SUT ETHILON 3 0 PS 1 (SUTURE) ×2 IMPLANT
SUT MNCRL AB 3-0 PS2 18 (SUTURE) ×2 IMPLANT
SUT MON AB 2-0 CT1 36 (SUTURE) ×2 IMPLANT
SUT PDS AB 2-0 CT1 27 (SUTURE) IMPLANT
SUT PROLENE 0 CT (SUTURE) IMPLANT
SUT PROLENE 3 0 PS 2 (SUTURE) ×4 IMPLANT
SUT VIC AB 0 CT1 27 (SUTURE) ×4
SUT VIC AB 0 CT1 27XBRD ANBCTR (SUTURE) ×2 IMPLANT
SUT VIC AB 2-0 CT1 27 (SUTURE) ×4
SUT VIC AB 2-0 CT1 TAPERPNT 27 (SUTURE) ×2 IMPLANT
SUT VIC AB 2-0 CT3 27 (SUTURE) IMPLANT
SYR CONTROL 10ML LL (SYRINGE) ×2 IMPLANT
TOWEL OR 17X24 6PK STRL BLUE (TOWEL DISPOSABLE) IMPLANT
TOWEL OR 17X26 10 PK STRL BLUE (TOWEL DISPOSABLE) ×4 IMPLANT
TUBE CONNECTING 12X1/4 (SUCTIONS) ×2 IMPLANT
UNDERPAD 30X30 (UNDERPADS AND DIAPERS) ×2 IMPLANT
WATER STERILE IRR 1000ML POUR (IV SOLUTION) ×2 IMPLANT
YANKAUER SUCT BULB TIP NO VENT (SUCTIONS) ×2 IMPLANT

## 2017-12-06 NOTE — Anesthesia Procedure Notes (Signed)
Procedure Name: Intubation Date/Time: 12/06/2017 8:39 AM Performed by: Inda Coke, CRNA Pre-anesthesia Checklist: Patient identified, Emergency Drugs available, Suction available and Patient being monitored Patient Re-evaluated:Patient Re-evaluated prior to induction Oxygen Delivery Method: Circle System Utilized Preoxygenation: Pre-oxygenation with 100% oxygen Induction Type: IV induction Ventilation: Mask ventilation without difficulty and Oral airway inserted - appropriate to patient size Laryngoscope Size: Mac and 3 Grade View: Grade I Tube type: Oral Tube size: 7.0 mm Number of attempts: 1 Airway Equipment and Method: Stylet and Oral airway Placement Confirmation: ETT inserted through vocal cords under direct vision,  positive ETCO2 and breath sounds checked- equal and bilateral Secured at: 21 cm Tube secured with: Tape Dental Injury: Teeth and Oropharynx as per pre-operative assessment

## 2017-12-06 NOTE — Anesthesia Postprocedure Evaluation (Signed)
Anesthesia Post Note  Patient: Holly [name]  Procedure(s) Performed: OPEN REDUCTION INTERNAL FIXATION (ORIF) ELBOW/OLECRANON FRACTURE (Right )     Patient location during evaluation: PACU Anesthesia Type: General and Regional Level of consciousness: awake and alert Pain management: pain level controlled Vital Signs Assessment: post-procedure vital signs reviewed and stable Respiratory status: spontaneous breathing, nonlabored ventilation, respiratory function stable and patient connected to nasal cannula oxygen Cardiovascular status: blood pressure returned to baseline and stable Postop Assessment: no apparent nausea or vomiting Anesthetic complications: no    Last Vitals:  Vitals:   12/06/17 1048 12/06/17 1057  BP: (!) 124/57 128/61  Pulse: 69 71  Resp: 18   Temp: (!) 36.3 C   SpO2: 95%     Last Pain:  Vitals:   12/06/17 1048  TempSrc:   PainSc: 0-No pain                 Trayquan Kolakowski,W. EDMOND

## 2017-12-06 NOTE — Anesthesia Procedure Notes (Signed)
Anesthesia Regional Block: Supraclavicular block   Pre-Anesthetic Checklist: ,, timeout performed, Correct Patient, Correct Site, Correct Laterality, Correct Procedure, Correct Position, site marked, Risks and benefits discussed, pre-op evaluation,  At surgeon's request and post-op pain management  Laterality: Right  Prep: Maximum Sterile Barrier Precautions used, chloraprep       Needles:  Injection technique: Single-shot  Needle Type: Echogenic Stimulator Needle     Needle Length: 5cm  Needle Gauge: 22     Additional Needles:   Procedures:,,,, ultrasound used (permanent image in chart),,,,  Narrative:  Start time: 12/06/2017 8:05 AM End time: 12/06/2017 8:15 AM Injection made incrementally with aspirations every 5 mL. Anesthesiologist: Roderic Palau, MD  Additional Notes: 2% Lidocaine skin wheel.

## 2017-12-06 NOTE — Interval H&P Note (Signed)
History and Physical Interval Note:  12/06/2017 6:55 AM  Holly [name]  has presented today for surgery, with the diagnosis of Right olecranon fracture  The various methods of treatment have been discussed with the patient and family. After consideration of risks, benefits and other options for treatment, the patient has consented to  Procedure(s): OPEN REDUCTION INTERNAL FIXATION (ORIF) ELBOW/OLECRANON FRACTURE (Right) as a surgical intervention .  The patient's history has been reviewed, patient examined, no change in status, stable for surgery.  I have reviewed the patient's chart and labs.  Questions were answered to the patient's satisfaction.     Haddix, Thomasene Lot

## 2017-12-06 NOTE — Transfer of Care (Signed)
Immediate Anesthesia Transfer of Care Note  Patient: Holly [name]  Procedure(s) Performed: OPEN REDUCTION INTERNAL FIXATION (ORIF) ELBOW/OLECRANON FRACTURE (Right )  Patient Location: PACU  Anesthesia Type:GA combined with regional for post-op pain  Level of Consciousness: awake and alert   Airway & Oxygen Therapy: Patient Spontanous Breathing and Patient connected to nasal cannula oxygen  Post-op Assessment: Report given to RN and Post -op Vital signs reviewed and stable  Post vital signs: Reviewed and stable  Last Vitals:  Vitals:   12/06/17 0627 12/06/17 1017  BP: (!) 129/44 (!) 144/62  Pulse: (!) 58 79  Resp: 18 16  Temp: 36.6 C (!) 36.2 C  SpO2: 94% 92%    Last Pain:  Vitals:   12/06/17 1017  TempSrc:   PainSc: (P) 0-No pain      Patients Stated Pain Goal: 3 (06/00/45 9977)  Complications: No apparent anesthesia complications

## 2017-12-06 NOTE — Op Note (Signed)
OrthopaedicSurgeryOperativeNote ([id number]:[id number]) Date of Surgery: 12/06/2017  Admit Date: 12/06/2017   Diagnoses: Pre-Op Diagnoses:    * Closed fracture of right olecranon process, initial encounter [S52.021A]   Post-Op Diagnosis:    * Closed fracture of right olecranon process, initial encounter [S52.021A]  Procedures: CPT 24685-ORIF of right olecranon fracture   Surgeons: Primary: Shona Needles, MD   Location:[facility] OR ROOM 07   AnesthesiaGeneral   Antibiotics:Ancef 2g preop   Tourniquettime: Total Tourniquet Time Documented: Upper Arm (Right) - 42 minutes Total: Upper Arm (Right) - 42 minutes  [id number]:03 mL   Complications:None  Specimens:None  Implants: Implant Name Type Inv. Item Serial No. Manufacturer Lot No. LRB No. Used Action  PLATE OLECRANON PROX 2.7/3.5 - [id number] Plate PLATE OLECRANON PROX 2.7/3.5  [employer]  Right 1 Implanted  SCREW LOCKING 2.7X14MM - [id number] Screw SCREW LOCKING 2.7X14MM  [employer]  Right 1 Implanted  SCREW LOCKING 2.7X18MM - [id number] Screw SCREW LOCKING 2.7X18MM  [employer]  Right 1 Implanted  SCREW LOCKING 2.7X24MM - [id number] Screw SCREW LOCKING 2.7X24MM  [employer]  Right 1 Implanted  SCREW LOCKING 2.7X28 - [id number] Screw SCREW LOCKING 2.7X28  [employer]  Right 1 Implanted  SCREW LOCKING VA 2.7X30MM - [id number] Screw SCREW LOCKING VA 2.7X30MM  [employer]  Right 1 Implanted  SCREW LOCKING VA 2.7X40MM - [id number] Screw SCREW LOCKING VA 2.7X40MM  [employer]  Right 1 Implanted    IndicationsforSurgery: 72 year old female RHD with history of COPD and asthma with right displaced olecranon fracture Discussed need for ORIF for optimal function of arm. Risks and benefits discussed. Risks discussed included bleeding requiring blood transfusion, bleeding causing a hematoma, infection, malunion, nonunion, damage to surrounding nerves and blood vessels, pain, hardware prominence or irritation,  hardware failure, stiffness, post-traumatic arthritis, DVT/PE, compartment syndrome, and even death. Risks and benefits were extensively discussed as noted above and the patient and their family agreed to proceed with surgery and consent was obtained.  Operative Findings: Displaced right olecranon fracture treated with ORIF with [employer] VA 2-hole olecranon plate  Procedure: The patient was identified in the preoperative holding area. Consent was confirmed with the patient and their family and all questions were answered. The operative extremity was marked after confirmation with the patient. The patient was then brought back to the operating room by our anesthesia colleagues.  The patient was then placed under general anesthetic and carried over to a radiolucent flat top table.  Here they were placed in the lateral decubitus position with the operative side up.  The down arm was well padded and positioned out of the way of fluoroscopy. The operative arm was then draped over a bone foam. The operative extremity was then prepped and draped in usual sterile fashion. A preoperative timeout was performed to verify the patient, the procedure, and the extremity. Preoperative antibiotics were dosed.  A standard posterior approach to the olecranon was made.  It was carried down through skin and subcutaneous tissue.  The fracture with was exposed.  Periosteum was removed from the fracture edges.  The hematoma was cleaned out and irrigated.  The incision was extended between the FCU and ECU down to the ulna.  The periosteum was reflected off of the ulnar shaft to be able to place the olecranon plate.  Once the fracture was cleaned out a towel clip was used to manipulate the proximal fragment with the triceps attachment.  A 2.5 millimeter drill bit was used to place a drill hole  into the ulnar shaft.  A reduction forceps was placed in this hole and the other tine was placed along the proximal end of the fracture  fragment.  The fracture was reduced and then pinned in place with a number of 1.6 mm K wires.  A two-hole proximal ulna VA locking plate was then contoured to fit along the olecranon.  The triceps was split to allow the plate to be more flush to bone.  The reduction clamp was used to compress the plate down to the proximal segment.  A nonlocking plate screw was placed in the shaft to align the distal portion of the plate.  I then used the [place] guide to proceed to place locking screws both across the fracture and into the proximal segment. Locking screws were placed in the proximal and distal segments.  Final fluoroscopic images were obtained.  The incision was thoroughly irrigated.  A gram of vancomycin powder was placed into the incision.  A 0 Vicryl was used to close the soft tissue over the plate.  The skin was then closed with 2-0 Vicryl and 3-0 nylon.  A sterile dressing consisting of bacitracin ointment, adaptic, 4x4s and Ace wraps.  She was then placed back in her sling.  She was placed supine, extubated and taken to the PACU in stable condition.  Post Op Plan/Instructions: Nonweightbearing right arm. Follow up in 2 weeks for suture removal. No DVT prophylaxis need for upper extremity ambulatory patient.  I was present and performed the entire surgery.  Katha Hamming, MD [facility]

## 2017-12-06 NOTE — Discharge Instructions (Addendum)
[facility]opaedic [facility]uma Service Discharge Instructions   General Discharge Instructions  WEIGHT BEARING STATUS: Nonweight bearing right arm  RANGE OF MOTION/ACTIVITY: No restrictions for motion. Work to bend and extend the elbow.  Wound Care: You may remove the dressing on Saturday and then following the instructions below.  DVT/PE prophylaxis: None needed  Diet: as you were eating previously.  Can use over the counter stool softeners and bowel preparations, such as Miralax, to help with bowel movements.  Narcotics can be constipating.  Be sure to drink plenty of fluids  PAIN MEDICATION USE AND EXPECTATIONS  You have likely been given narcotic medications to help control your pain.  After a traumatic event that results in an fracture (broken bone) with or without surgery, it is ok to use narcotic pain medications to help control one's pain.  We understand that everyone responds to pain differently and each individual patient will be evaluated on a regular basis for the continued need for narcotic medications. Ideally, narcotic medication use should last no more than 6-8 weeks (coinciding with fracture healing).   As a patient it is your responsibility as well to monitor narcotic medication use and report the amount and frequency you use these medications when you come to your office visit.   We would also advise that if you are using narcotic medications, you should take a dose prior to therapy to maximize you participation.  IF YOU ARE ON NARCOTIC MEDICATIONS IT IS NOT PERMISSIBLE TO OPERATE A MOTOR VEHICLE (MOTORCYCLE/CAR/TRUCK/MOPED) OR HEAVY MACHINERY DO NOT MIX NARCOTICS WITH OTHER CNS (CENTRAL NERVOUS SYSTEM) DEPRESSANTS SUCH AS ALCOHOL   STOP SMOKING OR USING NICOTINE PRODUCTS!!!!  As discussed nicotine severely impairs your body's ability to heal surgical and traumatic wounds but also impairs bone healing.  Wounds and bone heal by forming microscopic blood vessels (angiogenesis) and  nicotine is a vasoconstrictor (essentially, shrinks blood vessels).  Therefore, if vasoconstriction occurs to these microscopic blood vessels they essentially disappear and are unable to deliver necessary nutrients to the healing tissue.  This is one modifiable factor that you can do to dramatically increase your chances of healing your injury.    (This means no smoking, no nicotine gum, patches, etc)  DO NOT USE NONSTEROIDAL ANTI-INFLAMMATORY DRUGS (NSAID'S)  Using products such as Advil (ibuprofen), Aleve (naproxen), Motrin (ibuprofen) for additional pain control during fracture healing can delay and/or prevent the healing response.  If you would like to take over the counter (OTC) medication, Tylenol (acetaminophen) is ok.  However, some narcotic medications that are given for pain control contain acetaminophen as well. Therefore, you should not exceed more than 4000 mg of tylenol in a day if you do not have liver disease.  Also note that there are may OTC medicines, such as cold medicines and allergy medicines that my contain tylenol as well.  If you have any questions about medications and/or interactions please ask your doctor/PA or your pharmacist.      ICE AND ELEVATE INJURED/OPERATIVE EXTREMITY  Using ice and elevating the injured extremity above your heart can help with swelling and pain control.  Icing in a pulsatile fashion, such as 20 minutes on and 20 minutes off, can be followed.    Do not place ice directly on skin. Make sure there is a barrier between to skin and the ice pack.    Using frozen items such as frozen peas works well as the conform nicely to the are that needs to be iced.  USE AN ACE  WRAP OR TED HOSE FOR SWELLING CONTROL  In addition to icing and elevation, Ace wraps or TED hose are used to help limit and resolve swelling.  It is recommended to use Ace wraps or TED hose until you are informed to stop.    When using Ace Wraps start the wrapping distally (farthest away from  the body) and wrap proximally (closer to the body)   Example: If you had surgery on your leg or thing and you do not have a splint on, start the ace wrap at the toes and work your way up to the thigh        If you had surgery on your upper extremity and do not have a splint on, start the ace wrap at your fingers and work your way up to the upper arm  [place]: [phone]    Discharge Wound Care Instructions  Do NOT apply any ointments, solutions or lotions to pin sites or surgical wounds.  These prevent needed drainage and even though solutions like hydrogen peroxide kill bacteria, they also damage cells lining the pin sites that help fight infection.  Applying lotions or ointments can keep the wounds moist and can cause them to breakdown and open up as well. This can increase the risk for infection. When in doubt call the office.  Surgical incisions should be dressed daily.  If any drainage is noted, use one layer of adaptic, then gauze, Kerlix, and an ace wrap.  Once the incision is completely dry and without drainage, it may be left open to air out.  Showering may begin 36-48 hours later.  Cleaning gently with soap and water.

## 2017-12-07 ENCOUNTER — Encounter (HOSPITAL_COMMUNITY): Payer: Self-pay | Admitting: Student

## 2017-12-19 DIAGNOSIS — S52021D Displaced fracture of olecranon process without intraarticular extension of right ulna, subsequent encounter for closed fracture with routine healing: Secondary | ICD-10-CM | POA: Diagnosis not present

## 2017-12-29 DIAGNOSIS — J449 Chronic obstructive pulmonary disease, unspecified: Secondary | ICD-10-CM | POA: Diagnosis not present

## 2017-12-29 DIAGNOSIS — F411 Generalized anxiety disorder: Secondary | ICD-10-CM | POA: Diagnosis not present

## 2017-12-29 DIAGNOSIS — G4733 Obstructive sleep apnea (adult) (pediatric): Secondary | ICD-10-CM | POA: Diagnosis not present

## 2017-12-29 DIAGNOSIS — R5383 Other fatigue: Secondary | ICD-10-CM | POA: Diagnosis not present

## 2018-01-01 DIAGNOSIS — S52021D Displaced fracture of olecranon process without intraarticular extension of right ulna, subsequent encounter for closed fracture with routine healing: Secondary | ICD-10-CM | POA: Diagnosis not present

## 2018-01-24 DIAGNOSIS — R0600 Dyspnea, unspecified: Secondary | ICD-10-CM | POA: Diagnosis not present

## 2018-01-24 DIAGNOSIS — J441 Chronic obstructive pulmonary disease with (acute) exacerbation: Secondary | ICD-10-CM | POA: Diagnosis not present

## 2018-02-09 DIAGNOSIS — J449 Chronic obstructive pulmonary disease, unspecified: Secondary | ICD-10-CM | POA: Diagnosis not present

## 2018-02-09 DIAGNOSIS — R5383 Other fatigue: Secondary | ICD-10-CM | POA: Diagnosis not present

## 2018-04-13 DIAGNOSIS — Z1231 Encounter for screening mammogram for malignant neoplasm of breast: Secondary | ICD-10-CM | POA: Diagnosis not present

## 2018-05-04 DIAGNOSIS — E559 Vitamin D deficiency, unspecified: Secondary | ICD-10-CM | POA: Diagnosis not present

## 2018-05-04 DIAGNOSIS — I10 Essential (primary) hypertension: Secondary | ICD-10-CM | POA: Diagnosis not present

## 2018-05-04 DIAGNOSIS — J449 Chronic obstructive pulmonary disease, unspecified: Secondary | ICD-10-CM | POA: Diagnosis not present

## 2018-05-04 DIAGNOSIS — F17201 Nicotine dependence, unspecified, in remission: Secondary | ICD-10-CM | POA: Diagnosis not present

## 2018-05-04 DIAGNOSIS — F419 Anxiety disorder, unspecified: Secondary | ICD-10-CM | POA: Diagnosis not present

## 2018-05-04 DIAGNOSIS — J9611 Chronic respiratory failure with hypoxia: Secondary | ICD-10-CM | POA: Diagnosis not present

## 2018-05-04 DIAGNOSIS — E890 Postprocedural hypothyroidism: Secondary | ICD-10-CM | POA: Diagnosis not present

## 2018-05-04 DIAGNOSIS — E785 Hyperlipidemia, unspecified: Secondary | ICD-10-CM | POA: Diagnosis not present

## 2018-05-18 DIAGNOSIS — D0471 Carcinoma in situ of skin of right lower limb, including hip: Secondary | ICD-10-CM | POA: Diagnosis not present

## 2018-05-18 DIAGNOSIS — L821 Other seborrheic keratosis: Secondary | ICD-10-CM | POA: Diagnosis not present

## 2018-05-18 DIAGNOSIS — D225 Melanocytic nevi of trunk: Secondary | ICD-10-CM | POA: Diagnosis not present

## 2018-05-18 DIAGNOSIS — L570 Actinic keratosis: Secondary | ICD-10-CM | POA: Diagnosis not present

## 2018-05-18 DIAGNOSIS — L814 Other melanin hyperpigmentation: Secondary | ICD-10-CM | POA: Diagnosis not present

## 2018-05-18 DIAGNOSIS — Z85828 Personal history of other malignant neoplasm of skin: Secondary | ICD-10-CM | POA: Diagnosis not present

## 2018-06-08 DIAGNOSIS — R5383 Other fatigue: Secondary | ICD-10-CM | POA: Diagnosis not present

## 2018-06-08 DIAGNOSIS — G4733 Obstructive sleep apnea (adult) (pediatric): Secondary | ICD-10-CM | POA: Diagnosis not present

## 2018-06-08 DIAGNOSIS — J449 Chronic obstructive pulmonary disease, unspecified: Secondary | ICD-10-CM | POA: Diagnosis not present

## 2018-07-11 DIAGNOSIS — R5383 Other fatigue: Secondary | ICD-10-CM | POA: Diagnosis not present

## 2018-07-11 DIAGNOSIS — G4733 Obstructive sleep apnea (adult) (pediatric): Secondary | ICD-10-CM | POA: Diagnosis not present

## 2018-07-11 DIAGNOSIS — J441 Chronic obstructive pulmonary disease with (acute) exacerbation: Secondary | ICD-10-CM | POA: Diagnosis not present

## 2018-07-11 DIAGNOSIS — R918 Other nonspecific abnormal finding of lung field: Secondary | ICD-10-CM | POA: Diagnosis not present

## 2018-07-20 DIAGNOSIS — R5383 Other fatigue: Secondary | ICD-10-CM | POA: Diagnosis not present

## 2018-07-20 DIAGNOSIS — J439 Emphysema, unspecified: Secondary | ICD-10-CM | POA: Diagnosis not present

## 2018-07-20 DIAGNOSIS — G4733 Obstructive sleep apnea (adult) (pediatric): Secondary | ICD-10-CM | POA: Diagnosis not present

## 2018-07-20 DIAGNOSIS — D3502 Benign neoplasm of left adrenal gland: Secondary | ICD-10-CM | POA: Diagnosis not present

## 2018-07-20 DIAGNOSIS — I700 Atherosclerosis of aorta: Secondary | ICD-10-CM | POA: Diagnosis not present

## 2018-07-20 DIAGNOSIS — R918 Other nonspecific abnormal finding of lung field: Secondary | ICD-10-CM | POA: Diagnosis not present

## 2018-07-20 DIAGNOSIS — J449 Chronic obstructive pulmonary disease, unspecified: Secondary | ICD-10-CM | POA: Diagnosis not present

## 2018-07-20 DIAGNOSIS — Z23 Encounter for immunization: Secondary | ICD-10-CM | POA: Diagnosis not present

## 2018-10-05 DIAGNOSIS — G4733 Obstructive sleep apnea (adult) (pediatric): Secondary | ICD-10-CM | POA: Diagnosis not present

## 2018-10-05 DIAGNOSIS — J449 Chronic obstructive pulmonary disease, unspecified: Secondary | ICD-10-CM | POA: Diagnosis not present

## 2018-10-05 DIAGNOSIS — J9611 Chronic respiratory failure with hypoxia: Secondary | ICD-10-CM | POA: Diagnosis not present

## 2018-10-05 DIAGNOSIS — R918 Other nonspecific abnormal finding of lung field: Secondary | ICD-10-CM | POA: Diagnosis not present

## 2018-10-05 DIAGNOSIS — R5383 Other fatigue: Secondary | ICD-10-CM | POA: Diagnosis not present

## 2018-10-17 HISTORY — PX: ELBOW ARTHROPLASTY: SHX928

## 2019-03-19 DIAGNOSIS — L821 Other seborrheic keratosis: Secondary | ICD-10-CM | POA: Diagnosis not present

## 2019-03-19 DIAGNOSIS — Z85828 Personal history of other malignant neoplasm of skin: Secondary | ICD-10-CM | POA: Diagnosis not present

## 2019-03-19 DIAGNOSIS — C44529 Squamous cell carcinoma of skin of other part of trunk: Secondary | ICD-10-CM | POA: Diagnosis not present

## 2019-03-19 DIAGNOSIS — L905 Scar conditions and fibrosis of skin: Secondary | ICD-10-CM | POA: Diagnosis not present

## 2019-04-17 DIAGNOSIS — R5383 Other fatigue: Secondary | ICD-10-CM | POA: Diagnosis not present

## 2019-04-17 DIAGNOSIS — J9611 Chronic respiratory failure with hypoxia: Secondary | ICD-10-CM | POA: Diagnosis not present

## 2019-04-17 DIAGNOSIS — J449 Chronic obstructive pulmonary disease, unspecified: Secondary | ICD-10-CM | POA: Diagnosis not present

## 2019-04-17 DIAGNOSIS — Z79899 Other long term (current) drug therapy: Secondary | ICD-10-CM | POA: Diagnosis not present

## 2019-04-17 DIAGNOSIS — G4733 Obstructive sleep apnea (adult) (pediatric): Secondary | ICD-10-CM | POA: Diagnosis not present

## 2019-04-17 DIAGNOSIS — Z79891 Long term (current) use of opiate analgesic: Secondary | ICD-10-CM | POA: Diagnosis not present

## 2019-04-17 DIAGNOSIS — R918 Other nonspecific abnormal finding of lung field: Secondary | ICD-10-CM | POA: Diagnosis not present

## 2019-04-17 DIAGNOSIS — F411 Generalized anxiety disorder: Secondary | ICD-10-CM | POA: Diagnosis not present

## 2019-04-25 DIAGNOSIS — D3502 Benign neoplasm of left adrenal gland: Secondary | ICD-10-CM | POA: Diagnosis not present

## 2019-04-25 DIAGNOSIS — Z8709 Personal history of other diseases of the respiratory system: Secondary | ICD-10-CM | POA: Diagnosis not present

## 2019-04-25 DIAGNOSIS — I700 Atherosclerosis of aorta: Secondary | ICD-10-CM | POA: Diagnosis not present

## 2019-04-25 DIAGNOSIS — J439 Emphysema, unspecified: Secondary | ICD-10-CM | POA: Diagnosis not present

## 2019-04-25 DIAGNOSIS — R918 Other nonspecific abnormal finding of lung field: Secondary | ICD-10-CM | POA: Diagnosis not present

## 2019-04-25 DIAGNOSIS — J9809 Other diseases of bronchus, not elsewhere classified: Secondary | ICD-10-CM | POA: Diagnosis not present

## 2019-04-26 DIAGNOSIS — Z1231 Encounter for screening mammogram for malignant neoplasm of breast: Secondary | ICD-10-CM | POA: Diagnosis not present

## 2019-04-30 DIAGNOSIS — F411 Generalized anxiety disorder: Secondary | ICD-10-CM | POA: Diagnosis not present

## 2019-04-30 DIAGNOSIS — G4733 Obstructive sleep apnea (adult) (pediatric): Secondary | ICD-10-CM | POA: Diagnosis not present

## 2019-04-30 DIAGNOSIS — J9611 Chronic respiratory failure with hypoxia: Secondary | ICD-10-CM | POA: Diagnosis not present

## 2019-04-30 DIAGNOSIS — R5383 Other fatigue: Secondary | ICD-10-CM | POA: Diagnosis not present

## 2019-04-30 DIAGNOSIS — R918 Other nonspecific abnormal finding of lung field: Secondary | ICD-10-CM | POA: Diagnosis not present

## 2019-04-30 DIAGNOSIS — J449 Chronic obstructive pulmonary disease, unspecified: Secondary | ICD-10-CM | POA: Diagnosis not present

## 2019-05-14 DIAGNOSIS — R5383 Other fatigue: Secondary | ICD-10-CM | POA: Diagnosis not present

## 2019-05-14 DIAGNOSIS — J9611 Chronic respiratory failure with hypoxia: Secondary | ICD-10-CM | POA: Diagnosis not present

## 2019-05-14 DIAGNOSIS — R918 Other nonspecific abnormal finding of lung field: Secondary | ICD-10-CM | POA: Diagnosis not present

## 2019-05-14 DIAGNOSIS — J449 Chronic obstructive pulmonary disease, unspecified: Secondary | ICD-10-CM | POA: Diagnosis not present

## 2019-05-14 DIAGNOSIS — F411 Generalized anxiety disorder: Secondary | ICD-10-CM | POA: Diagnosis not present

## 2019-05-14 DIAGNOSIS — G4733 Obstructive sleep apnea (adult) (pediatric): Secondary | ICD-10-CM | POA: Diagnosis not present

## 2019-05-20 DIAGNOSIS — F419 Anxiety disorder, unspecified: Secondary | ICD-10-CM | POA: Diagnosis not present

## 2019-05-20 DIAGNOSIS — F325 Major depressive disorder, single episode, in full remission: Secondary | ICD-10-CM | POA: Diagnosis not present

## 2019-05-20 DIAGNOSIS — E669 Obesity, unspecified: Secondary | ICD-10-CM | POA: Diagnosis not present

## 2019-05-20 DIAGNOSIS — Z0000 Encounter for general adult medical examination without abnormal findings: Secondary | ICD-10-CM | POA: Diagnosis not present

## 2019-05-20 DIAGNOSIS — Z86010 Personal history of colonic polyps: Secondary | ICD-10-CM | POA: Diagnosis not present

## 2019-05-20 DIAGNOSIS — J449 Chronic obstructive pulmonary disease, unspecified: Secondary | ICD-10-CM | POA: Diagnosis not present

## 2019-05-20 DIAGNOSIS — I10 Essential (primary) hypertension: Secondary | ICD-10-CM | POA: Diagnosis not present

## 2019-05-20 DIAGNOSIS — E785 Hyperlipidemia, unspecified: Secondary | ICD-10-CM | POA: Diagnosis not present

## 2019-05-20 DIAGNOSIS — E890 Postprocedural hypothyroidism: Secondary | ICD-10-CM | POA: Diagnosis not present

## 2019-05-20 DIAGNOSIS — I700 Atherosclerosis of aorta: Secondary | ICD-10-CM | POA: Diagnosis not present

## 2019-05-20 DIAGNOSIS — E559 Vitamin D deficiency, unspecified: Secondary | ICD-10-CM | POA: Diagnosis not present

## 2019-05-20 DIAGNOSIS — J9611 Chronic respiratory failure with hypoxia: Secondary | ICD-10-CM | POA: Diagnosis not present

## 2019-06-05 DIAGNOSIS — C3490 Malignant neoplasm of unspecified part of unspecified bronchus or lung: Secondary | ICD-10-CM | POA: Diagnosis not present

## 2019-06-05 DIAGNOSIS — R918 Other nonspecific abnormal finding of lung field: Secondary | ICD-10-CM | POA: Diagnosis not present

## 2019-06-06 DIAGNOSIS — J9611 Chronic respiratory failure with hypoxia: Secondary | ICD-10-CM | POA: Diagnosis not present

## 2019-06-06 DIAGNOSIS — J449 Chronic obstructive pulmonary disease, unspecified: Secondary | ICD-10-CM | POA: Diagnosis not present

## 2019-06-06 DIAGNOSIS — R918 Other nonspecific abnormal finding of lung field: Secondary | ICD-10-CM | POA: Diagnosis not present

## 2019-06-06 DIAGNOSIS — R5383 Other fatigue: Secondary | ICD-10-CM | POA: Diagnosis not present

## 2019-06-06 DIAGNOSIS — F411 Generalized anxiety disorder: Secondary | ICD-10-CM | POA: Diagnosis not present

## 2019-06-21 ENCOUNTER — Other Ambulatory Visit: Payer: Self-pay | Admitting: *Deleted

## 2019-06-21 ENCOUNTER — Institutional Professional Consult (permissible substitution) (INDEPENDENT_AMBULATORY_CARE_PROVIDER_SITE_OTHER): Payer: Medicare Other | Admitting: Thoracic Surgery (Cardiothoracic Vascular Surgery)

## 2019-06-21 ENCOUNTER — Other Ambulatory Visit: Payer: Self-pay | Admitting: Thoracic Surgery (Cardiothoracic Vascular Surgery)

## 2019-06-21 ENCOUNTER — Ambulatory Visit
Admission: RE | Admit: 2019-06-21 | Discharge: 2019-06-21 | Disposition: A | Discharge status: Home / Self Care | Payer: Medicare Other | Source: Ambulatory Visit | Attending: Thoracic Surgery (Cardiothoracic Vascular Surgery) | Admitting: Thoracic Surgery (Cardiothoracic Vascular Surgery)

## 2019-06-21 ENCOUNTER — Encounter: Payer: Self-pay | Admitting: Thoracic Surgery (Cardiothoracic Vascular Surgery)

## 2019-06-21 ENCOUNTER — Encounter (HOSPITAL_COMMUNITY): Payer: Self-pay | Admitting: *Deleted

## 2019-06-21 ENCOUNTER — Other Ambulatory Visit: Payer: Self-pay

## 2019-06-21 VITALS — BP 150/67 | HR 67 | Temp 97.8°F | Resp 20 | Ht 60.0 in | Wt 170.0 lb

## 2019-06-21 DIAGNOSIS — J432 Centrilobular emphysema: Secondary | ICD-10-CM | POA: Diagnosis not present

## 2019-06-21 DIAGNOSIS — R911 Solitary pulmonary nodule: Secondary | ICD-10-CM

## 2019-06-21 IMAGING — CT CT CHEST SUPER D W/O CM
1 series · 15 of 34 positions shown, 19 images · non-contrast
Comparison: PET-CT scan [DATE], CT scan [DATE]

CLINICAL DATA: Pulmonary nodule.  Bronchoscopy planning.

EXAM:
CT CHEST WITHOUT CONTRAST
TECHNIQUE: Multidetector CT imaging of the chest was performed using thin slice
collimation for electromagnetic bronchoscopy planning purposes,
without intravenous contrast.

[Series 2: chest w/(date) · axial · 0.73mm/px · z∈[-313,-43]mm · 15 of 159 slices shown, 19 images]
[im 12/159  mediastinal]
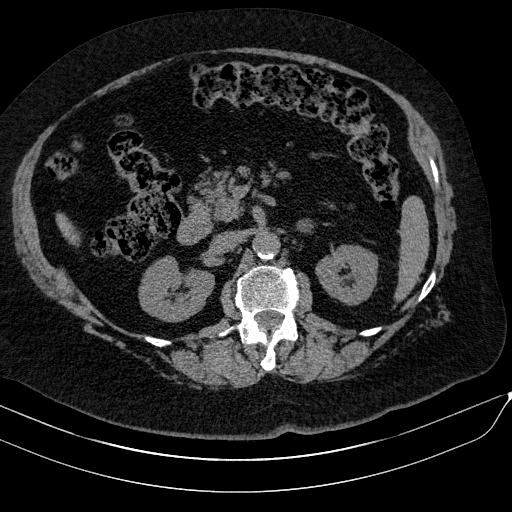
[im 12/159  lung]
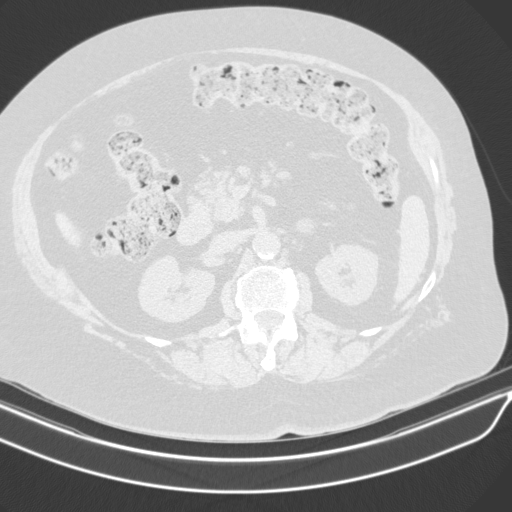
[im 24/159  lung]
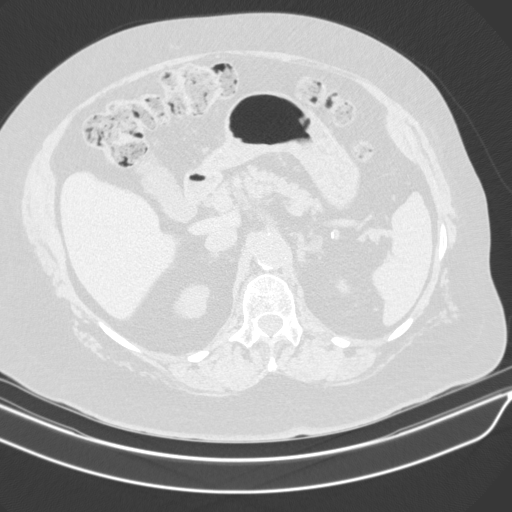
[im 32/159  lung]
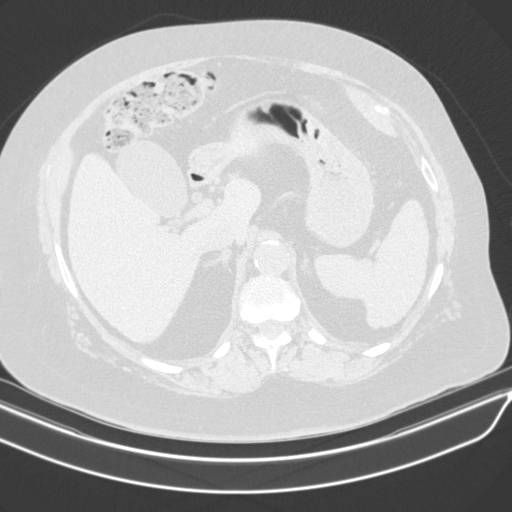
[im 41/159  lung]
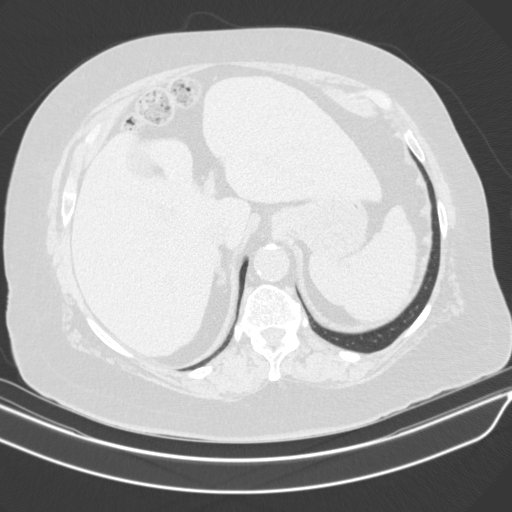
[im 53/159  mediastinal]
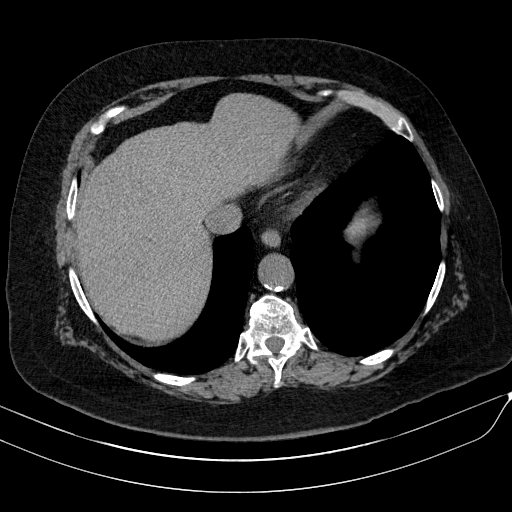
[im 53/159  lung]
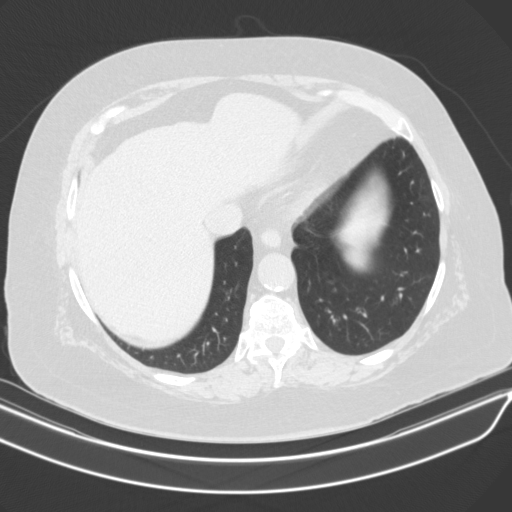
[im 64/159  lung]
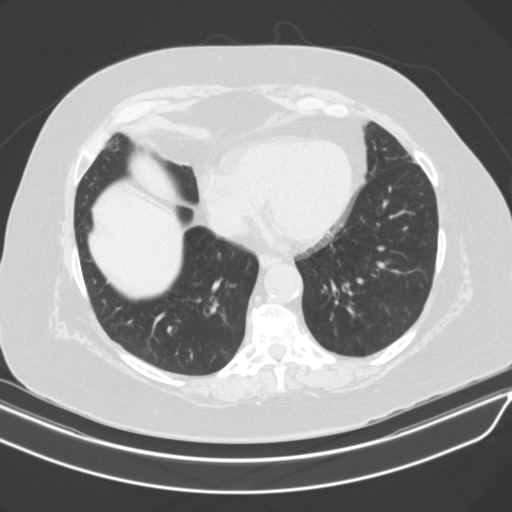
[im 71/159  lung]
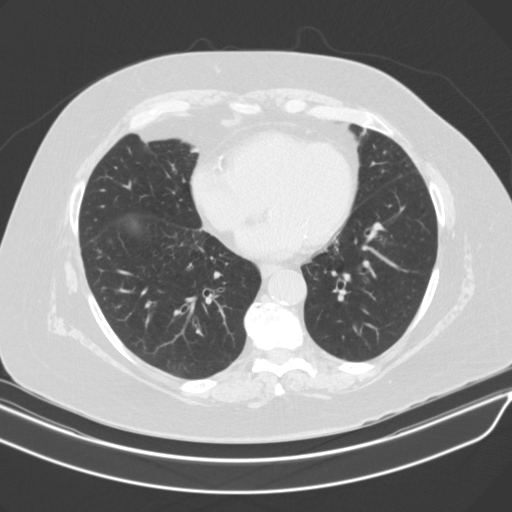
[im 82/159  lung]
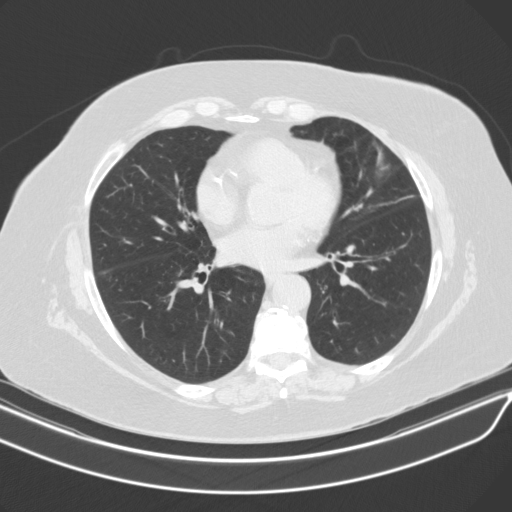
[im 88/159  mediastinal]
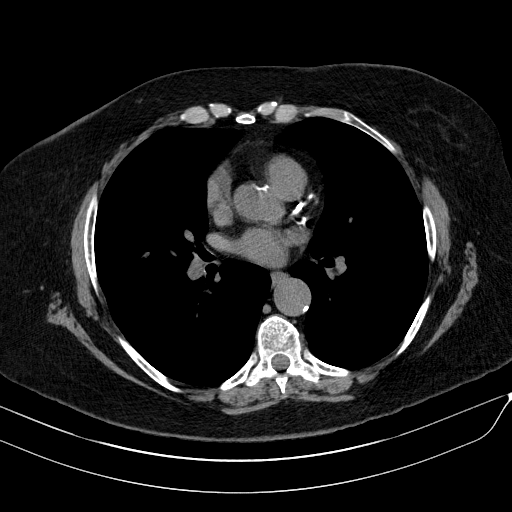
[im 88/159  lung]
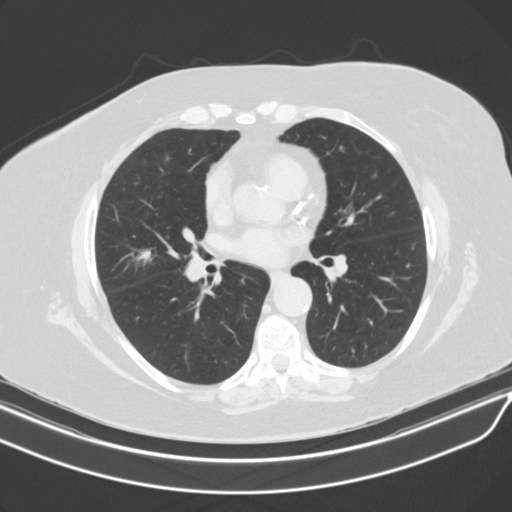
[im 95/159  lung]
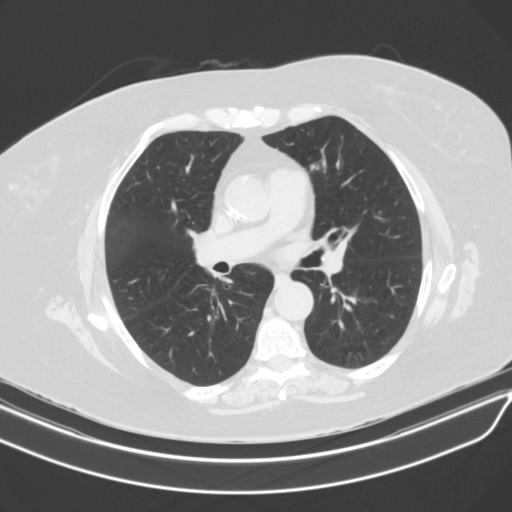
[im 106/159  lung]
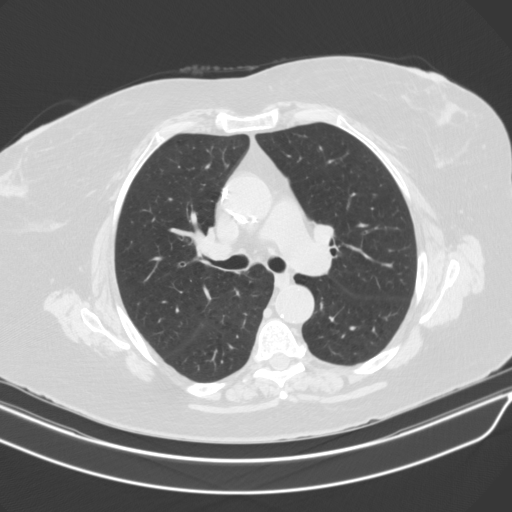
[im 118/159  lung]
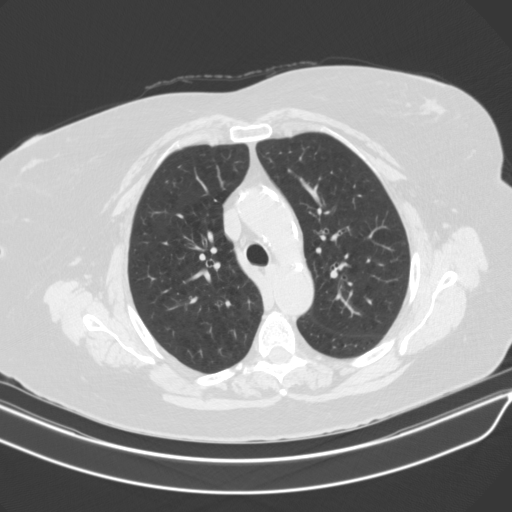
[im 127/159  mediastinal]
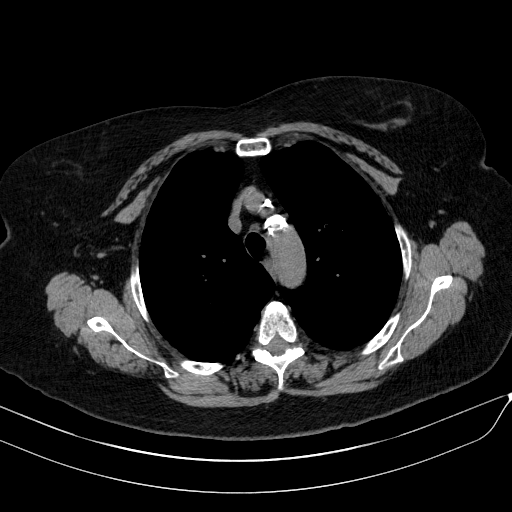
[im 127/159  lung]
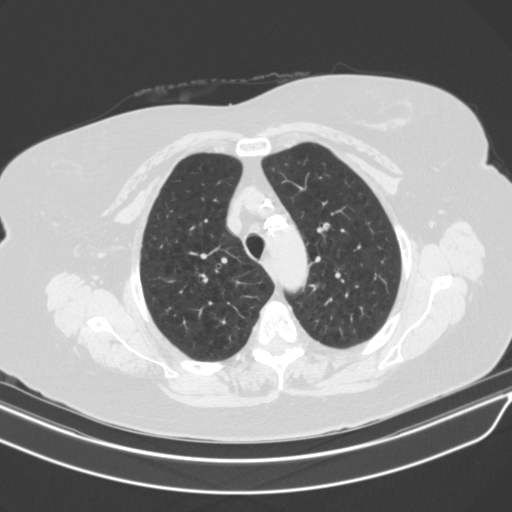
[im 135/159  lung]
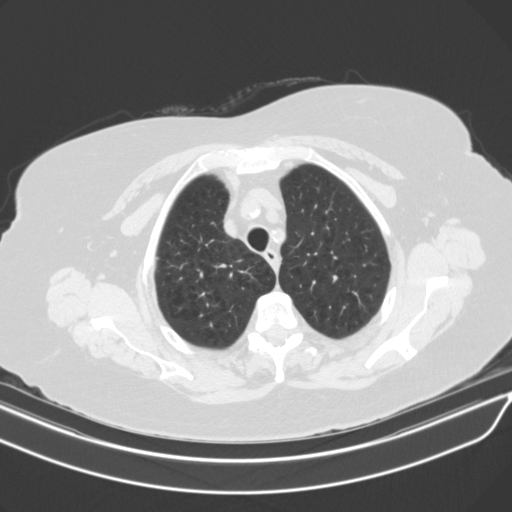
[im 147/159  lung]
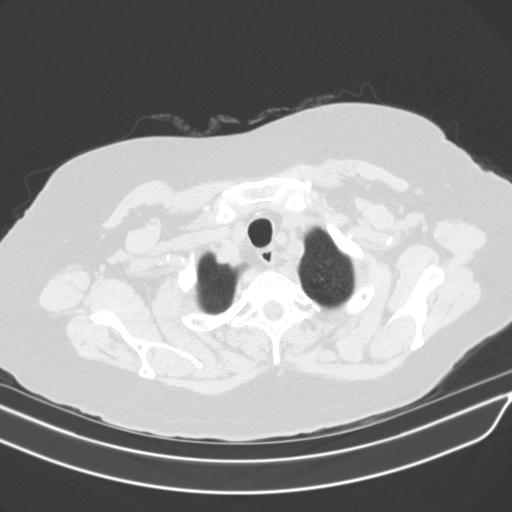

[15 of 34 positions shown; findings below may reference images not displayed]

FINDINGS: Cardiovascular: Coronary artery calcification and aortic
atherosclerotic calcification.

Mediastinum/Nodes: No axillary or supraclavicular adenopathy. No
mediastinal adenopathy no pericardial effusion. Esophagus normal

Lungs/Pleura: Within the LEFT upper lobe lobular nodule measuring 10
mm (image 62/3 comparison 9 mm for no significant change.

Small cavitary/peribronchial nodule in the LEFT upper lobe measuring
5 mm (image 32/3)

Is unchanged.

There is mild atelectasis more inferior lingula.

In the RIGHT middle lobe elongated irregular nodule measuring 13 mm
(image 70/3 compares with 12 mm.

Centrilobular emphysema within the lung apices.

Upper Abdomen: Limited view of the liver, kidneys, pancreas are
unremarkable. Normal adrenal glands.

Musculoskeletal: No aggressive osseous lesion.
IMPRESSION: 1. LEFT upper lobe pulmonary nodule (10 mm) not significant changed
from CT [DATE].
[DATE]. LEFT upper lobe pulmonary nodule nodule (5 mm) not significantly
changed.
3. RIGHT middle lobe pulmonary nodule not significant changed from
CT [DATE].
[DATE]. Centrilobular emphysema the upper lobes.

## 2019-06-21 NOTE — Progress Notes (Signed)
Pt stated that she has SOB due to COPD. Pt denies chest pain and being under the care of a cardiologist. Pt stated that her PCP is Dr/. Harlan Stains. Pt denies having a stress test, echo and cardiac cath. Pt denies having an EKG in the last year. Pt denies recent labs. Pt made aware to stop taking vitamins, fish oil and herbal medications. Do not take any NSAIDs ie: Ibuprofen, Advil, Naproxen (Aleve), Motrin, BC and Goody Powder. Pt verbalized understanding of all pre-op instructions.

## 2019-06-21 NOTE — Progress Notes (Signed)
[address]       [place] [address]             [place] Record #[id number] Date of Birth: 03-20-1946  Referring: Gardiner Rhyme, MD Primary Care: Harlan Stains, MD Primary Cardiologist: No primary care provider on file.  Chief Complaint:    Chief Complaint  Patient presents with  . Lung Lesion    Surgical eval, PET Scan 06/05/19, Chest CT 04/25/19    History of Present Illness:    Holly N Schneiderman 73 y.o. female referred for evaluation of 2 solitary pulmonary nodules that have shown interval growth.  She has a long history of smoking, and currently is on supplemental oxygen.  She admits to exertional dyspnea with minor activity, but denies any chest pain.  She has gained weight since retiring, and denies any neurologic symptoms.     Smoking Hx: Quit 2 years ago.  Smoked 1ppd for 57yrs  Zubrod Score: At the time of surgery this patient's most appropriate activity status/level should be described as: []     0    Normal activity, no symptoms [x]     1    Restricted in physical strenuous activity but ambulatory, able to do out light work []     2    Ambulatory and capable of self care, unable to do work activities, up and about               >50 % of waking hours                              []     3    Only limited self care, in bed greater than 50% of waking hours []     4    Completely disabled, no self care, confined to bed or chair []     5    Moribund   Past Medical History:  Diagnosis Date  . Anxiety   . Asthma   . Basal cell carcinoma   . Bruises easily   . Cervicalgia   . 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 Dyspnea    uses O2 only when needed. Uses 2 L   . Frequency of urination   . 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 Hypercalcemia   . Hypertension   . Mixed hyperlipidemia   . Pneumonia   . Thyroid neoplasm   . Urinary incontinence      Past Surgical History:  Procedure Laterality Date  . COLONOSCOPY    . DENTAL SURGERY    . EYE SURGERY Right    cataract surgery  . FOOT SURGERY  30 YRS AGO   BILATERAL  . ORIF ELBOW FRACTURE Right 12/06/2017   Procedure: OPEN REDUCTION INTERNAL FIXATION (ORIF) ELBOW/OLECRANON FRACTURE;  Surgeon: Shona Needles, MD;  Location: [facility];  Service: Orthopedics;  Laterality: Right;  . SKIN CANCER EXCISION    . THYROIDECTOMY N/A 07/31/2014   Procedure: TOTAL THYROIDECTOMY;  Surgeon: Armandina Gemma, MD;  Location: [facility];  Service: General;  Laterality: N/A;  . VAGINAL HYSTERECTOMY      Family History  Problem Relation Age of Onset  . Coronary artery disease Father   . Hypertension Father   .  Asthma Father   . Allergies Father   . Hypertension Mother   . Lung cancer Mother      Social History   Tobacco Use  Smoking Status Former Smoker  . Packs/day: 1.00  . Years: 30.00  . Pack years: 30.00  . Types: Cigarettes  . Quit date: 02/24/2015  . Years since quitting: 4.3  Smokeless Tobacco Never Used    Social History   Substance and Sexual Activity  Alcohol Use Yes  . Alcohol/week: 0.0 standard drinks   Comment: very seldom     Allergies  Allergen Reactions  . Codeine Nausea And Vomiting  . Oxycodone-Acetaminophen Nausea And Vomiting    Current Outpatient Medications  Medication Sig Dispense Refill  . ALPRAZolam (XANAX) 0.25 MG tablet Take 0.25 mg by mouth at bedtime.     Marland Kitchen aspirin 81 MG tablet Take 81 mg by mouth daily.    Marland Kitchen atorvastatin (LIPITOR) 20 MG tablet Take 20 mg by mouth daily at 6 PM.    . bisoprolol (ZEBETA) 10 MG tablet Take 1 tablet (10 mg total) by mouth every morning. (Patient not taking: Reported on 12/04/2017) 30 tablet 1  . budesonide-formoterol (SYMBICORT) 160-4.5 MCG/ACT inhaler Inhale 2 puffs into the lungs 2 (two) times daily. (Patient not taking: Reported on 12/04/2017) 10.2 g 5  . buPROPion (WELLBUTRIN XL) 300 MG 24 hr tablet Take 300 mg by mouth  daily.    . busPIRone (BUSPAR) 30 MG tablet Take 30 mg by mouth 2 (two) times daily.    Marland Kitchen Dextromethorphan-Guaifenesin (MUCINEX FAST-MAX DM MAX) 5-100 MG/5ML LIQD Take 20 mLs by mouth 2 (two) times daily.    . Fluticasone-Umeclidin-Vilant (TRELEGY ELLIPTA) 100-62.5-25 MCG/INH AEPB Inhale 1 puff into the lungs daily.    Marland Kitchen levothyroxine (SYNTHROID, LEVOTHROID) 100 MCG tablet Take 100 mcg by mouth daily before breakfast. Once daily  0  . metoprolol succinate (TOPROL-XL) 50 MG 24 hr tablet Take 50 mg by mouth daily. Take with or immediately following a meal.    . nystatin (MYCOSTATIN) 100000 UNIT/ML suspension Take 5 mLs (500,000 Units total) by mouth as needed. (Patient taking differently: Take 5 mLs by mouth as needed (thrush). ) 60 mL 0  . olmesartan-hydrochlorothiazide (BENICAR HCT) 20-12.5 MG per tablet Take 1 tablet by mouth daily. (Patient not taking: Reported on 12/04/2017) 14 tablet 0  . omeprazole (PRILOSEC) 40 MG capsule TAKE 1 CAPSULE BY MOUTH DAILY (Patient not taking: Reported on 12/04/2017) 30 capsule 2  . Respiratory Therapy Supplies (FLUTTER) DEVI Use as directed 1 each 0  . tiotropium (SPIRIVA) 18 MCG inhalation capsule Place 1 capsule (18 mcg total) into inhaler and inhale daily. (Patient not taking: Reported on 12/04/2017) 30 capsule 5  . tiotropium (SPIRIVA) 18 MCG inhalation capsule Place 1 capsule (18 mcg total) into inhaler and inhale daily. (Patient not taking: Reported on 12/04/2017) 30 capsule 6  . valsartan-hydrochlorothiazide (DIOVAN-HCT) 160-12.5 MG tablet Take 1 tablet by mouth daily.    . VENTOLIN HFA 108 (90 Base) MCG/ACT inhaler INHALE 2 PUFFS INTO THE LUNGS EVERY 4 HOURS AS NEEDED FOR WHEEZING ORSHORTNESS OF BREATH 8 g 0   No current facility-administered medications for this visit.     Review of Systems  Constitutional: Positive for malaise/fatigue. Negative for chills, fever and weight loss.  HENT: Negative.   Respiratory: Positive for cough and shortness of breath.    Cardiovascular: Negative for chest pain and leg swelling.  Musculoskeletal: Negative.   Neurological: Negative.   Endo/Heme/Allergies: Bruises/bleeds easily.  PHYSICAL EXAMINATION: Ht 5' (1.524 m)   BMI 33.20 kg/m  Physical Exam  Constitutional: She is oriented to person, place, and time. She appears well-developed and well-nourished. No distress.  HENT:  Head: Normocephalic and atraumatic.  Eyes: Conjunctivae and EOM are normal.  Neck: Normal range of motion. No tracheal deviation present.  Cardiovascular: Normal rate and regular rhythm.  No murmur heard. Respiratory: Effort normal.  Distant breath sounds  GI: Soft. She exhibits no distension.  Musculoskeletal: Normal range of motion.  Neurological: She is alert and oriented to person, place, and time.  Skin: Skin is warm and dry. She is not diaphoretic.    Diagnostic Studies & Laboratory data:     Recent Radiology Findings:  CT Chest 04/25/19: 1.2cm left upper lobe nodule.  Spiculated margins 1.3cm right middle lobe nodule with spiculated margins No hilar or mediastinal lymphadenopathy 1.5cm left adrenal nodule  PET CT 06/05/19: LUL nodule SUV 8.5 RML nodule SUV 1.4 Adrenal nodule no avidity    I have independently reviewed the above radiology studies  and reviewed the findings with the patient.   Recent Lab Findings: Lab Results  Component Value Date   WBC 7.0 12/06/2017   HGB 12.3 12/06/2017   HCT 39.4 12/06/2017   PLT 176 12/06/2017   GLUCOSE 124 (H) 12/06/2017   ALT 9 08/11/2014   AST 16 08/11/2014   NA 140 12/06/2017   K 3.8 12/06/2017   CL 99 (L) 12/06/2017   CREATININE 1.01 (H) 12/06/2017   BUN 9 12/06/2017   CO2 29 12/06/2017   INR 1.00 07/29/2014     PFTs: 10/19 - FVC: 50% - FEV1: 39% -DLCO: 23%  8/20 - FVC: 47% - FEV1: 45% -DLCO: none%  Problem List: Marginal lung function LUL 1.2cm pulmonary nodule RML 1.3cm pulmonary nodule  Assessment / Plan:   73 yo female with severe  COPD, on supplement O2, LUL and RML 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  She requires a tissue diagnosis, so I have recommended that she undergo a Navigational Bronchoscopy on 9/8.  Given her poor lung function, she is not a candidate for surgical resection in the future.  Her case will be discussed in tumor board, and she will be considered for chemotherapy and SBRT if the biopsy results show malignancy.     I  spent 40 minutes with  the patient face to face and greater then 50% of the time was spent in counseling and coordination of care.    Lajuana Matte 06/21/2019 9:35 AM

## 2019-06-22 ENCOUNTER — Other Ambulatory Visit (HOSPITAL_COMMUNITY)
Admission: RE | Admit: 2019-06-22 | Discharge: 2019-06-22 | Disposition: A | Discharge status: Home / Self Care | Payer: Medicare Other | Source: Ambulatory Visit | Attending: Thoracic Surgery (Cardiothoracic Vascular Surgery) | Admitting: Thoracic Surgery (Cardiothoracic Vascular Surgery)

## 2019-06-22 DIAGNOSIS — Z01812 Encounter for preprocedural laboratory examination: Secondary | ICD-10-CM | POA: Insufficient documentation

## 2019-06-22 DIAGNOSIS — Z20828 Contact with and (suspected) exposure to other viral communicable diseases: Secondary | ICD-10-CM | POA: Diagnosis not present

## 2019-06-23 LAB — NOVEL CORONAVIRUS, NAA (HOSP ORDER, SEND-OUT TO REF LAB; TAT 18-24 HRS): SARS-CoV-2, NAA: NOT DETECTED

## 2019-06-24 NOTE — Anesthesia Preprocedure Evaluation (Addendum)
Anesthesia Evaluation  Patient identified by MRN, date of birth, ID band Patient awake    Reviewed: Allergy & Precautions, NPO status , Patient's Chart, lab work & pertinent test results  Airway Mallampati: I  TM Distance: >3 FB     Dental  (+) Edentulous Upper, Edentulous Lower   Pulmonary shortness of breath, asthma , sleep apnea , pneumonia, COPD, former smoker,    breath sounds clear to auscultation       Cardiovascular hypertension,  Rhythm:Regular Rate:Normal     Neuro/Psych  Headaches, Anxiety    GI/Hepatic negative GI ROS, Neg liver ROS,   Endo/Other  Hypothyroidism   Renal/GU negative Renal ROS     Musculoskeletal   Abdominal   Peds  Hematology   Anesthesia Other Findings   Reproductive/Obstetrics                           Anesthesia Physical Anesthesia Plan  ASA: III  Anesthesia Plan: General   Post-op Pain Management:    Induction: Intravenous  PONV Risk Score and Plan: 3 and Ondansetron, Dexamethasone and Midazolam  Airway Management Planned: Oral ETT  Additional Equipment:   Intra-op Plan:   Post-operative Plan: Extubation in OR  Informed Consent: I have reviewed the patients History and Physical, chart, labs and discussed the procedure including the risks, benefits and alternatives for the proposed anesthesia with the patient or authorized representative who has indicated his/her understanding and acceptance.     Dental advisory given  Plan Discussed with: Anesthesiologist, CRNA and Surgeon  Anesthesia Plan Comments:        Anesthesia Quick Evaluation

## 2019-06-25 ENCOUNTER — Encounter (HOSPITAL_COMMUNITY)
Admission: RE | Disposition: A | Discharge status: Home / Self Care | Payer: Self-pay | Source: Home / Self Care | Attending: Thoracic Surgery (Cardiothoracic Vascular Surgery)

## 2019-06-25 ENCOUNTER — Ambulatory Visit (HOSPITAL_COMMUNITY): Payer: Medicare Other | Admitting: Certified Registered Nurse Anesthetist

## 2019-06-25 ENCOUNTER — Other Ambulatory Visit: Payer: Self-pay

## 2019-06-25 ENCOUNTER — Ambulatory Visit (HOSPITAL_COMMUNITY)
Admission: RE | Admit: 2019-06-25 | Discharge: 2019-06-25 | Disposition: A | Discharge status: Home / Self Care | Payer: Medicare Other | Attending: Thoracic Surgery (Cardiothoracic Vascular Surgery) | Admitting: Thoracic Surgery (Cardiothoracic Vascular Surgery)

## 2019-06-25 ENCOUNTER — Ambulatory Visit (HOSPITAL_COMMUNITY): Payer: Medicare Other

## 2019-06-25 ENCOUNTER — Encounter (HOSPITAL_COMMUNITY): Payer: Self-pay

## 2019-06-25 DIAGNOSIS — R911 Solitary pulmonary nodule: Secondary | ICD-10-CM | POA: Diagnosis not present

## 2019-06-25 DIAGNOSIS — Z7982 Long term (current) use of aspirin: Secondary | ICD-10-CM | POA: Diagnosis not present

## 2019-06-25 DIAGNOSIS — Z85820 Personal history of malignant melanoma of skin: Secondary | ICD-10-CM | POA: Diagnosis not present

## 2019-06-25 DIAGNOSIS — E782 Mixed hyperlipidemia: Secondary | ICD-10-CM | POA: Insufficient documentation

## 2019-06-25 DIAGNOSIS — F419 Anxiety disorder, unspecified: Secondary | ICD-10-CM | POA: Diagnosis not present

## 2019-06-25 DIAGNOSIS — J9611 Chronic respiratory failure with hypoxia: Secondary | ICD-10-CM | POA: Diagnosis not present

## 2019-06-25 DIAGNOSIS — D0222 Carcinoma in situ of left bronchus and lung: Secondary | ICD-10-CM | POA: Diagnosis not present

## 2019-06-25 DIAGNOSIS — Z885 Allergy status to narcotic agent status: Secondary | ICD-10-CM | POA: Diagnosis not present

## 2019-06-25 DIAGNOSIS — J439 Emphysema, unspecified: Secondary | ICD-10-CM | POA: Insufficient documentation

## 2019-06-25 DIAGNOSIS — Z87891 Personal history of nicotine dependence: Secondary | ICD-10-CM | POA: Insufficient documentation

## 2019-06-25 DIAGNOSIS — Z79899 Other long term (current) drug therapy: Secondary | ICD-10-CM | POA: Diagnosis not present

## 2019-06-25 DIAGNOSIS — C3412 Malignant neoplasm of upper lobe, left bronchus or lung: Secondary | ICD-10-CM | POA: Insufficient documentation

## 2019-06-25 DIAGNOSIS — Z7951 Long term (current) use of inhaled steroids: Secondary | ICD-10-CM | POA: Insufficient documentation

## 2019-06-25 DIAGNOSIS — E039 Hypothyroidism, unspecified: Secondary | ICD-10-CM | POA: Insufficient documentation

## 2019-06-25 DIAGNOSIS — Z9981 Dependence on supplemental oxygen: Secondary | ICD-10-CM | POA: Insufficient documentation

## 2019-06-25 DIAGNOSIS — Z419 Encounter for procedure for purposes other than remedying health state, unspecified: Secondary | ICD-10-CM

## 2019-06-25 DIAGNOSIS — R918 Other nonspecific abnormal finding of lung field: Secondary | ICD-10-CM | POA: Diagnosis present

## 2019-06-25 DIAGNOSIS — Z79890 Hormone replacement therapy (postmenopausal): Secondary | ICD-10-CM | POA: Insufficient documentation

## 2019-06-25 DIAGNOSIS — G4730 Sleep apnea, unspecified: Secondary | ICD-10-CM | POA: Diagnosis not present

## 2019-06-25 DIAGNOSIS — R846 Abnormal cytological findings in specimens from respiratory organs and thorax: Secondary | ICD-10-CM | POA: Diagnosis not present

## 2019-06-25 DIAGNOSIS — I10 Essential (primary) hypertension: Secondary | ICD-10-CM | POA: Diagnosis not present

## 2019-06-25 HISTORY — DX: Headache, unspecified: R51.9

## 2019-06-25 HISTORY — DX: Presence of dental prosthetic device (complete) (partial): Z97.2

## 2019-06-25 HISTORY — DX: Presence of spectacles and contact lenses: Z97.3

## 2019-06-25 HISTORY — PX: VIDEO BRONCHOSCOPY WITH ENDOBRONCHIAL NAVIGATION: SHX6175

## 2019-06-25 HISTORY — DX: Sleep apnea, unspecified: G47.30

## 2019-06-25 HISTORY — DX: Hypothyroidism, unspecified: E03.9

## 2019-06-25 HISTORY — DX: Unspecified cataract: H26.9

## 2019-06-25 HISTORY — DX: Solitary pulmonary nodule: R91.1

## 2019-06-25 LAB — COMPREHENSIVE METABOLIC PANEL
ALT: 15 U/L (ref 0–44)
AST: 38 U/L (ref 15–41)
Albumin: 3.8 g/dL (ref 3.5–5.0)
Alkaline Phosphatase: 96 U/L (ref 38–126)
Anion gap: 7 (ref 5–15)
BUN: 17 mg/dL (ref 8–23)
CO2: 32 mmol/L (ref 22–32)
Calcium: 10.5 mg/dL — ABNORMAL HIGH (ref 8.9–10.3)
Chloride: 100 mmol/L (ref 98–111)
Creatinine, Ser: 1.21 mg/dL — ABNORMAL HIGH (ref 0.44–1.00)
GFR calc Af Amer: 51 mL/min — ABNORMAL LOW (ref >60)
GFR calc non Af Amer: 44 mL/min — ABNORMAL LOW (ref >60)
Glucose, Bld: 133 mg/dL — ABNORMAL HIGH (ref 70–99)
Potassium: 5.1 mmol/L (ref 3.5–5.1)
Sodium: 139 mmol/L (ref 135–145)
Total Bilirubin: 1.3 mg/dL — ABNORMAL HIGH (ref 0.3–1.2)
Total Protein: 7 g/dL (ref 6.5–8.1)

## 2019-06-25 LAB — CBC
HCT: 43.2 % (ref 36.0–46.0)
Hemoglobin: 13.6 g/dL (ref 12.0–15.0)
MCH: 31.9 pg (ref 26.0–34.0)
MCHC: 31.5 g/dL (ref 30.0–36.0)
MCV: 101.2 fL — ABNORMAL HIGH (ref 80.0–100.0)
Platelets: 154 10*3/uL (ref 150–400)
RBC: 4.27 MIL/uL (ref 3.87–5.11)
RDW: 14.5 % (ref 11.5–15.5)
WBC: 7.5 10*3/uL (ref 4.0–10.5)
nRBC: 0 % (ref 0.0–0.2)

## 2019-06-25 LAB — PROTIME-INR
INR: 1 (ref 0.8–1.2)
Prothrombin Time: 12.7 seconds (ref 11.4–15.2)

## 2019-06-25 LAB — APTT: aPTT: 26 seconds (ref 24–36)

## 2019-06-25 IMAGING — CR DG CHEST 2V
2 series · 2 of 2 positions shown · non-contrast
Comparison: Chest CT [DATE] and earlier.

CLINICAL DATA: 73-year-old female preoperative study for lung
biopsy.

EXAM:
CHEST - 2 VIEW

[w chest pa]
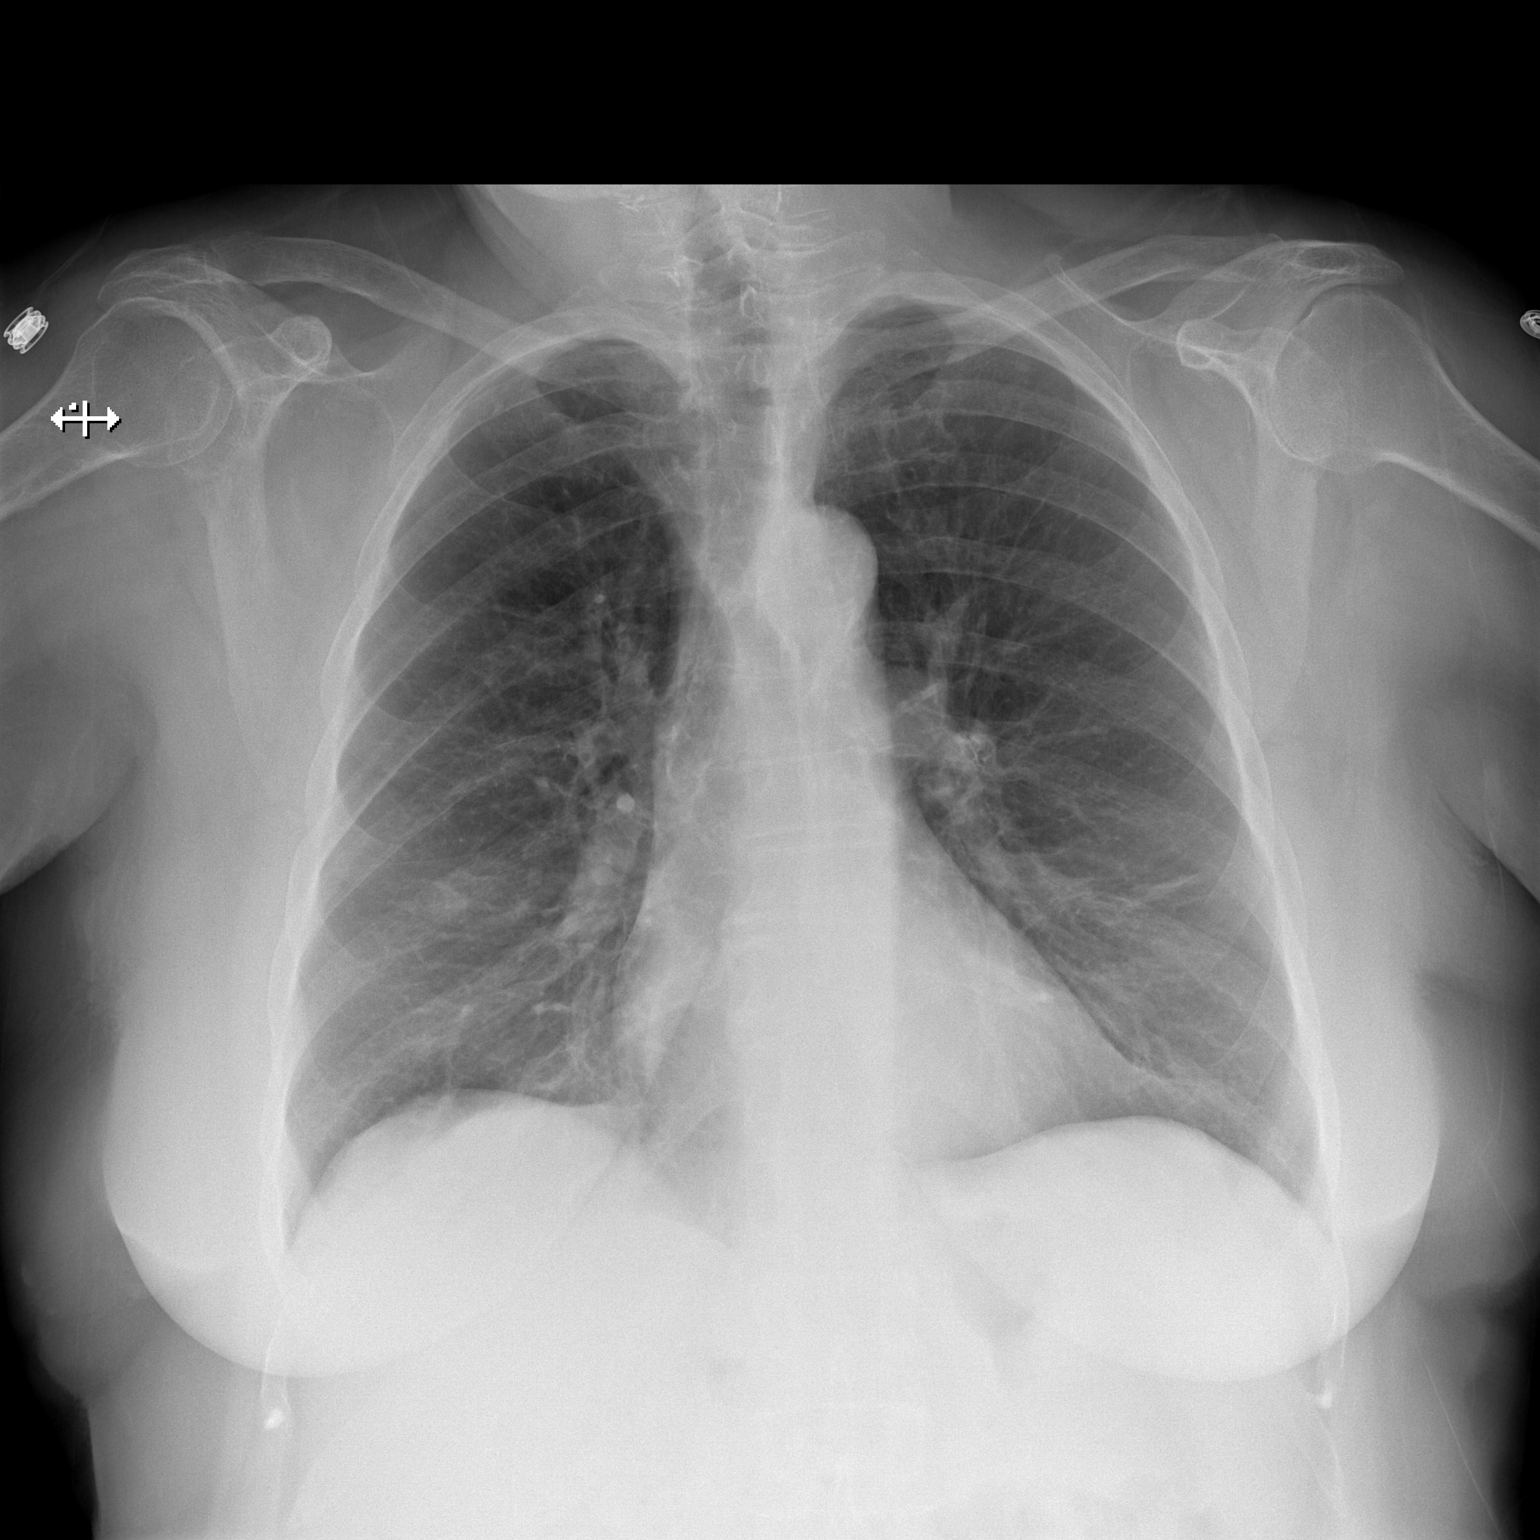

[w chest lat]
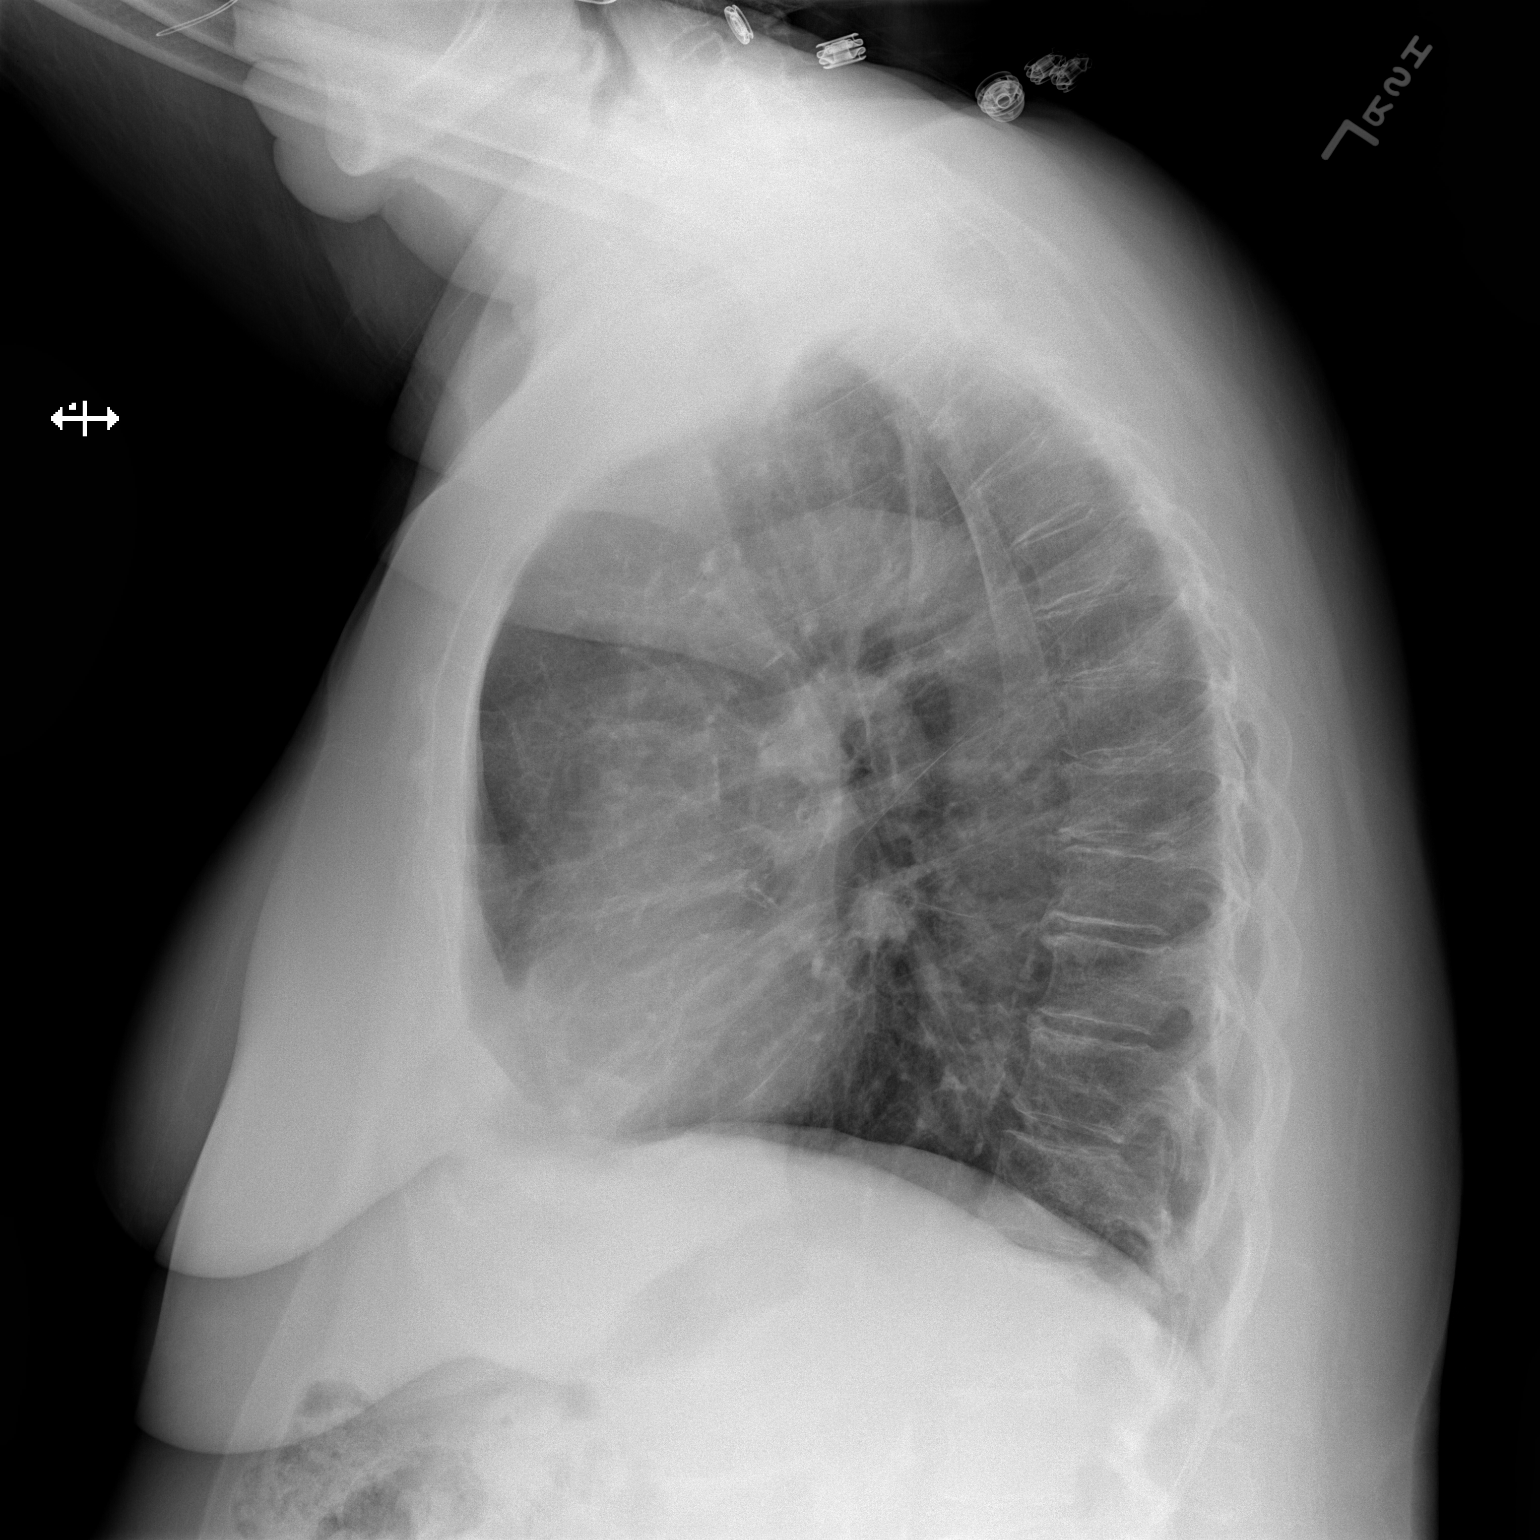

[2 of 2 positions shown; findings below may reference images not displayed]

FINDINGS: 10-12 millimeter bilateral pulmonary nodules were better
demonstrated by CT, that on the right is faintly apparent by x-ray
(arrow). Lung volumes and mediastinal contours remain normal.
Visualized tracheal air column is within normal limits. Upper lobe
attenuation of bronchovascular markings compatible with
centrilobular emphysema by CT. No pneumothorax, pleural effusion or
new pulmonary opacity.

No acute osseous abnormality identified. Negative visible bowel gas
pattern.
IMPRESSION: Emphysema with bilateral lung nodules better demonstrated on the
recent CT. No new cardiopulmonary abnormality.

## 2019-06-25 SURGERY — VIDEO BRONCHOSCOPY WITH ENDOBRONCHIAL NAVIGATION
Anesthesia: General | Laterality: Bilateral

## 2019-06-25 MED ORDER — LIDOCAINE 2% (20 MG/ML) 5 ML SYRINGE
INTRAMUSCULAR | Status: DC | PRN
  Administered 2019-06-25: 100 mg via INTRAVENOUS

## 2019-06-25 MED ORDER — ONDANSETRON HCL 4 MG/2ML IJ SOLN
INTRAMUSCULAR | Status: DC | PRN
  Administered 2019-06-25: 4 mg via INTRAVENOUS

## 2019-06-25 MED ORDER — FENTANYL CITRATE (PF) 100 MCG/2ML IJ SOLN: 25.0000 ug | INTRAMUSCULAR | Status: DC | PRN

## 2019-06-25 MED ORDER — ONDANSETRON HCL 4 MG/2ML IJ SOLN
INTRAMUSCULAR | Status: AC
  Filled 2019-06-25: qty 2

## 2019-06-25 MED ORDER — PROPOFOL 10 MG/ML IV BOLUS
INTRAVENOUS | Status: DC | PRN
  Administered 2019-06-25: 110 mg via INTRAVENOUS

## 2019-06-25 MED ORDER — SODIUM CHLORIDE 0.9 % IV SOLN
INTRAVENOUS | Status: DC | PRN
  Administered 2019-06-25: 25 ug/min via INTRAVENOUS

## 2019-06-25 MED ORDER — SUCCINYLCHOLINE CHLORIDE 200 MG/10ML IV SOSY
PREFILLED_SYRINGE | INTRAVENOUS | Status: AC
  Filled 2019-06-25: qty 10

## 2019-06-25 MED ORDER — PROPOFOL 10 MG/ML IV BOLUS
INTRAVENOUS | Status: AC
  Filled 2019-06-25: qty 20

## 2019-06-25 MED ORDER — ROCURONIUM BROMIDE 10 MG/ML (PF) SYRINGE
PREFILLED_SYRINGE | INTRAVENOUS | Status: DC | PRN
  Administered 2019-06-25: 50 mg via INTRAVENOUS

## 2019-06-25 MED ORDER — EPHEDRINE SULFATE-NACL 50-0.9 MG/10ML-% IV SOSY
PREFILLED_SYRINGE | INTRAVENOUS | Status: DC | PRN
  Administered 2019-06-25 (×2): 5 mg via INTRAVENOUS

## 2019-06-25 MED ORDER — FENTANYL CITRATE (PF) 100 MCG/2ML IJ SOLN
INTRAMUSCULAR | Status: DC | PRN
  Administered 2019-06-25: 25 ug via INTRAVENOUS
  Administered 2019-06-25: 50 ug via INTRAVENOUS

## 2019-06-25 MED ORDER — LIDOCAINE 2% (20 MG/ML) 5 ML SYRINGE
INTRAMUSCULAR | Status: AC
  Filled 2019-06-25: qty 5

## 2019-06-25 MED ORDER — DEXAMETHASONE SODIUM PHOSPHATE 10 MG/ML IJ SOLN
INTRAMUSCULAR | Status: DC | PRN
  Administered 2019-06-25: 10 mg via INTRAVENOUS

## 2019-06-25 MED ORDER — FENTANYL CITRATE (PF) 250 MCG/5ML IJ SOLN
INTRAMUSCULAR | Status: AC
  Filled 2019-06-25: qty 5

## 2019-06-25 MED ORDER — SUCCINYLCHOLINE CHLORIDE 20 MG/ML IJ SOLN
INTRAMUSCULAR | Status: DC | PRN
  Administered 2019-06-25: 100 mg via INTRAVENOUS

## 2019-06-25 MED ORDER — LACTATED RINGERS IV SOLN
INTRAVENOUS | Status: DC | PRN
  Administered 2019-06-25: 07:00:00 via INTRAVENOUS

## 2019-06-25 MED ORDER — EPHEDRINE 5 MG/ML INJ
INTRAVENOUS | Status: AC
  Filled 2019-06-25: qty 10

## 2019-06-25 MED ORDER — 0.9 % SODIUM CHLORIDE (POUR BTL) OPTIME
TOPICAL | Status: DC | PRN
  Administered 2019-06-25: 1000 mL

## 2019-06-25 MED ORDER — DEXAMETHASONE SODIUM PHOSPHATE 10 MG/ML IJ SOLN
INTRAMUSCULAR | Status: AC
  Filled 2019-06-25: qty 1

## 2019-06-25 MED ORDER — EPINEPHRINE PF 1 MG/ML IJ SOLN
INTRAMUSCULAR | Status: DC | PRN
  Administered 2019-06-25: 1 mg

## 2019-06-25 MED ORDER — SUGAMMADEX SODIUM 200 MG/2ML IV SOLN
INTRAVENOUS | Status: DC | PRN
  Administered 2019-06-25: 200 mg via INTRAVENOUS

## 2019-06-25 MED ORDER — ROCURONIUM BROMIDE 10 MG/ML (PF) SYRINGE
PREFILLED_SYRINGE | INTRAVENOUS | Status: AC
  Filled 2019-06-25: qty 10

## 2019-06-25 MED ORDER — PHENYLEPHRINE 40 MCG/ML (10ML) SYRINGE FOR IV PUSH (FOR BLOOD PRESSURE SUPPORT)
PREFILLED_SYRINGE | INTRAVENOUS | Status: DC | PRN
  Administered 2019-06-25 (×2): 40 ug via INTRAVENOUS
  Administered 2019-06-25: 120 ug via INTRAVENOUS

## 2019-06-25 SURGICAL SUPPLY — 42 items
ADAPTER BRONCHOSCOPE OLYMPUS (ADAPTER) ×1 IMPLANT
ADPR BSCP OLMPS EDG (ADAPTER) ×1
BLADE CLIPPER SURG (BLADE) ×2 IMPLANT
BRUSH BIOPSY BRONCH 10 SDTNB (MISCELLANEOUS) ×2 IMPLANT
BRUSH CYTOLOGY (MISCELLANEOUS) ×1 IMPLANT
BRUSH SUPERTRAX BIOPSY (INSTRUMENTS) ×1 IMPLANT
CANISTER SUCT 3000ML PPV (MISCELLANEOUS) ×2 IMPLANT
CHANNEL WORK EXTEND EDGE 180 (KITS) ×1 IMPLANT
CONT SPEC 4OZ CLIKSEAL STRL BL (MISCELLANEOUS) ×5 IMPLANT
COVER BACK TABLE 60X90IN (DRAPES) ×2 IMPLANT
DRAPE HALF SHEET 40X57 (DRAPES) ×1 IMPLANT
FILTER STRAW FLUID ASPIR (MISCELLANEOUS) ×1 IMPLANT
FORCEPS BIOP SUPERTRX PREMAR (INSTRUMENTS) ×1 IMPLANT
GAUZE SPONGE 4X4 12PLY STRL (GAUZE/BANDAGES/DRESSINGS) ×2 IMPLANT
GAUZE SPONGE 4X4 12PLY STRL LF (GAUZE/BANDAGES/DRESSINGS) ×1 IMPLANT
GLOVE BIO SURGEON STRL SZ7 (GLOVE) ×2 IMPLANT
GLOVE BIO SURGEON STRL SZ7.5 (GLOVE) ×2 IMPLANT
GOWN STRL REUS W/ TWL XL LVL3 (GOWN DISPOSABLE) ×1 IMPLANT
GOWN STRL REUS W/TWL XL LVL3 (GOWN DISPOSABLE) ×2
KIT CLEAN ENDO COMPLIANCE (KITS) ×2 IMPLANT
KIT PROCEDURE EDGE 180 (KITS) ×1 IMPLANT
KIT TURNOVER KIT B (KITS) ×2 IMPLANT
MARKER SKIN DUAL TIP RULER LAB (MISCELLANEOUS) ×2 IMPLANT
NDL SUPERTRX PREMARK BIOPSY (NEEDLE) IMPLANT
NEEDLE SUPERTRX PREMARK BIOPSY (NEEDLE) ×4 IMPLANT
NS IRRIG 1000ML POUR BTL (IV SOLUTION) ×2 IMPLANT
OIL SILICONE PENTAX (PARTS (SERVICE/REPAIRS)) ×1 IMPLANT
PAD ARMBOARD 7.5X6 YLW CONV (MISCELLANEOUS) ×4 IMPLANT
STOPCOCK MORSE 400PSI 3WAY (MISCELLANEOUS) ×2 IMPLANT
SYR 10ML LL (SYRINGE) ×2 IMPLANT
SYR 20ML ECCENTRIC (SYRINGE) ×2 IMPLANT
SYR 3ML LL SCALE MARK (SYRINGE) ×1 IMPLANT
TOWEL GREEN STERILE (TOWEL DISPOSABLE) ×2 IMPLANT
TOWEL GREEN STERILE FF (TOWEL DISPOSABLE) ×2 IMPLANT
TOWEL NATURAL 4PK STERILE (DISPOSABLE) ×2 IMPLANT
TRAP SPECIMEN MUCOUS 40CC (MISCELLANEOUS) ×3 IMPLANT
TUBE CONNECTING 20X1/4 (TUBING) ×2 IMPLANT
UNDERPAD 30X30 (UNDERPADS AND DIAPERS) ×2 IMPLANT
VALVE BIOPSY  SINGLE USE (MISCELLANEOUS) ×1
VALVE BIOPSY SINGLE USE (MISCELLANEOUS) ×1 IMPLANT
VALVE SUCTION BRONCHIO DISP (MISCELLANEOUS) ×2 IMPLANT
WATER STERILE IRR 1000ML POUR (IV SOLUTION) ×2 IMPLANT

## 2019-06-25 NOTE — Transfer of Care (Signed)
Immediate Anesthesia Transfer of Care Note  Patient: Vedanshi N Shere  Procedure(s) Performed: VIDEO BRONCHOSCOPY WITH ENDOBRONCHIAL NAVIGATION (Bilateral )  Patient Location: PACU  Anesthesia Type:General  Level of Consciousness: awake, alert  and oriented  Airway & Oxygen Therapy: Patient Spontanous Breathing and Patient connected to nasal cannula oxygen  Post-op Assessment: Report given to RN, Post -op Vital signs reviewed and stable and Patient moving all extremities  Post vital signs: Reviewed and stable  Last Vitals:  Vitals Value Taken Time  BP 145/68 06/25/19 1003  Temp    Pulse 80 06/25/19 1005  Resp 19 06/25/19 1005  SpO2 96 % 06/25/19 1005  Vitals shown include unvalidated device data.  Last Pain:  Vitals:   06/25/19 0635  TempSrc:   PainSc: 0-No pain         Complications: No apparent anesthesia complications

## 2019-06-25 NOTE — H&P (Signed)
Holly [name]       Holly [name],Holly [name]             [facility] Record #[id number] Date of Birth: [birth year]  Referring: Gardiner Rhyme, MD Primary Care: Harlan Stains, MD Primary Cardiologist: No primary care provider on file.  Chief Complaint:        Chief Complaint  Patient presents with  . Lung Lesion    Surgical eval, PET Scan 06/05/19, Chest CT 04/25/19    History of Present Illness:    No changes since her clinic appointment.  Per my last note: Holly [name] 73 y.o. female referred for evaluation of 2 solitary pulmonary nodules that have shown interval growth.  She has a long history of smoking, and currently is on supplemental oxygen.  She admits to exertional dyspnea with minor activity, but denies any chest pain.  She has gained weight since retiring, and denies any neurologic symptoms.     Smoking Hx: Quit 2 years ago.  Smoked 1ppd for 10yrs  Zubrod Score: At the time of surgery this patient's most appropriate activity status/level should be described as: [] ?    0    Normal activity, no symptoms [x] ?    1    Restricted in physical strenuous activity but ambulatory, able to do out light work [] ?    2    Ambulatory and capable of self care, unable to do work activities, up and about               >50 % of waking hours                              [] ?    3    Only limited self care, in bed greater than 50% of waking hours [] ?    4    Completely disabled, no self care, confined to bed or chair [] ?    5    Moribund       Past Medical History:  Diagnosis Date  . Anxiety   . Asthma   . Basal cell carcinoma   . Bruises easily   . Cervicalgia   . 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 Dyspnea    uses O2 only when needed. Uses 2 L   . Frequency of urination   . 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  Hypercalcemia   . Hypertension   . Mixed hyperlipidemia   . Pneumonia   . Thyroid neoplasm   . Urinary incontinence          Past Surgical History:  Procedure Laterality Date  . COLONOSCOPY    . DENTAL SURGERY    . EYE SURGERY Right    cataract surgery  . FOOT SURGERY  30 YRS AGO   BILATERAL  . ORIF ELBOW FRACTURE Right 12/06/2017   Procedure: OPEN REDUCTION INTERNAL FIXATION (ORIF) ELBOW/OLECRANON FRACTURE;  Surgeon: Shona Needles, MD;  Location: [facility];  Service: Orthopedics;  Laterality: Right;  . SKIN CANCER EXCISION    .  THYROIDECTOMY N/A 07/31/2014   Procedure: TOTAL THYROIDECTOMY;  Surgeon: Armandina Gemma, MD;  Location: [facility];  Service: General;  Laterality: N/A;  . VAGINAL HYSTERECTOMY           Family History  Problem Relation Age of Onset  . Coronary artery disease Father   . Hypertension Father   . Asthma Father   . Allergies Father   . Hypertension Mother   . Lung cancer Mother      Social History       Tobacco Use  Smoking Status Former Smoker  . Packs/day: 1.00  . Years: 30.00  . Pack years: 30.00  . Types: Cigarettes  . Quit date: 02/24/2015  . Years since quitting: 4.3  Smokeless Tobacco Never Used    Social History       Substance and Sexual Activity  Alcohol Use Yes  . Alcohol/week: 0.0 standard drinks   Comment: very seldom         Allergies  Allergen Reactions  . Codeine Nausea And Vomiting  . Oxycodone-Acetaminophen Nausea And Vomiting          Current Outpatient Medications  Medication Sig Dispense Refill  . ALPRAZolam (XANAX) 0.25 MG tablet Take 0.25 mg by mouth at bedtime.     Marland Kitchen aspirin 81 MG tablet Take 81 mg by mouth daily.    Marland Kitchen atorvastatin (LIPITOR) 20 MG tablet Take 20 mg by mouth daily at 6 PM.    . bisoprolol (ZEBETA) 10 MG tablet Take 1 tablet (10 mg total) by mouth every morning. (Patient not taking: Reported on 12/04/2017) 30 tablet 1  . budesonide-formoterol (SYMBICORT)  160-4.5 MCG/ACT inhaler Inhale 2 puffs into the lungs 2 (two) times daily. (Patient not taking: Reported on 12/04/2017) 10.2 g 5  . buPROPion (WELLBUTRIN XL) 300 MG 24 hr tablet Take 300 mg by mouth daily.    . busPIRone (BUSPAR) 30 MG tablet Take 30 mg by mouth 2 (two) times daily.    Marland Kitchen Dextromethorphan-Guaifenesin (MUCINEX FAST-MAX DM MAX) 5-100 MG/5ML LIQD Take 20 mLs by mouth 2 (two) times daily.    . Fluticasone-Umeclidin-Vilant (TRELEGY ELLIPTA) 100-62.5-25 MCG/INH AEPB Inhale 1 puff into the lungs daily.    Marland Kitchen levothyroxine (SYNTHROID, LEVOTHROID) 100 MCG tablet Take 100 mcg by mouth daily before breakfast. Once daily  0  . metoprolol succinate (TOPROL-XL) 50 MG 24 hr tablet Take 50 mg by mouth daily. Take with or immediately following a meal.    . nystatin (MYCOSTATIN) 100000 UNIT/ML suspension Take 5 mLs (500,000 Units total) by mouth as needed. (Patient taking differently: Take 5 mLs by mouth as needed (thrush). ) 60 mL 0  . olmesartan-hydrochlorothiazide (BENICAR HCT) 20-12.5 MG per tablet Take 1 tablet by mouth daily. (Patient not taking: Reported on 12/04/2017) 14 tablet 0  . omeprazole (PRILOSEC) 40 MG capsule TAKE 1 CAPSULE BY MOUTH DAILY (Patient not taking: Reported on 12/04/2017) 30 capsule 2  . Respiratory Therapy Supplies (FLUTTER) DEVI Use as directed 1 each 0  . tiotropium (SPIRIVA) 18 MCG inhalation capsule Place 1 capsule (18 mcg total) into inhaler and inhale daily. (Patient not taking: Reported on 12/04/2017) 30 capsule 5  . tiotropium (SPIRIVA) 18 MCG inhalation capsule Place 1 capsule (18 mcg total) into inhaler and inhale daily. (Patient not taking: Reported on 12/04/2017) 30 capsule 6  . valsartan-hydrochlorothiazide (DIOVAN-HCT) 160-12.5 MG tablet Take 1 tablet by mouth daily.    . VENTOLIN HFA 108 (90 Base) MCG/ACT inhaler INHALE 2 PUFFS INTO THE LUNGS EVERY  4 HOURS AS NEEDED FOR WHEEZING ORSHORTNESS OF BREATH 8 g 0   No current facility-administered  medications for this visit.     Review of Systems  Constitutional: Positive for malaise/fatigue. Negative for chills, fever and weight loss.  HENT: Negative.   Respiratory: Positive for cough and shortness of breath.   Cardiovascular: Negative for chest pain and leg swelling.  Musculoskeletal: Negative.   Neurological: Negative.   Endo/Heme/Allergies: Bruises/bleeds easily.     PHYSICAL EXAMINATION: Ht 5' (1.524 m)   BMI 33.20 kg/m  Physical Exam  Constitutional: She is oriented to person, place, and time. She appears well-developed and well-nourished. No distress.  HENT:  Head: Normocephalic and atraumatic.  Eyes: Conjunctivae and EOM are normal.  Neck: Normal range of motion. No tracheal deviation present.  Cardiovascular: Normal rate and regular rhythm.  No murmur heard. Respiratory: Effort normal.  Distant breath sounds  GI: Soft. She exhibits no distension.  Musculoskeletal: Normal range of motion.  Neurological: She is alert and oriented to person, place, and time.  Skin: Skin is warm and dry. She is not diaphoretic.    Diagnostic Studies & Laboratory data:     Recent Radiology Findings:  CT Chest 04/25/19: 1.2cm left upper lobe nodule.  Spiculated margins 1.3cm right middle lobe nodule with spiculated margins No hilar or mediastinal lymphadenopathy 1.5cm left adrenal nodule  PET CT 06/05/19: LUL nodule SUV 8.5 RML nodule SUV 1.4 Adrenal nodule no avidity    I have independently reviewed the above radiology studies  and reviewed the findings with the patient.   Recent Lab Findings: Recent Labs       Lab Results  Component Value Date   WBC 7.0 12/06/2017   HGB 12.3 12/06/2017   HCT 39.4 12/06/2017   PLT 176 12/06/2017   GLUCOSE 124 (H) 12/06/2017   ALT 9 08/11/2014   AST 16 08/11/2014   NA 140 12/06/2017   K 3.8 12/06/2017   CL 99 (L) 12/06/2017   CREATININE 1.01 (H) 12/06/2017   BUN 9 12/06/2017   CO2 29 12/06/2017   INR  1.00 07/29/2014       PFTs: 10/19 - FVC: 50% - FEV1: 39% -DLCO: 23%  8/20 - FVC: 47% - FEV1: 45% -DLCO: none%  Problem List: Marginal lung function LUL 1.2cm pulmonary nodule RML 1.3cm pulmonary nodule  Assessment / Plan:   73 yo female with severe COPD, on supplement O2, LUL and RML 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  She requires a tissue diagnosis, so I have recommended that she undergo a Navigational Bronchoscopy on 9/8.  Given her poor lung function, she is not a candidate for surgical resection in the future.  Her case will be discussed in tumor board, and she will be considered for chemotherapy and SBRT if the biopsy results show malignancy.       Carrissa Taitano Bary Leriche

## 2019-06-25 NOTE — Anesthesia Procedure Notes (Signed)
Procedure Name: Intubation Date/Time: 06/25/2019 7:38 AM Performed by: Kyung Rudd, CRNA Pre-anesthesia Checklist: Patient identified, Emergency Drugs available, Suction available, Patient being monitored and Timeout performed Patient Re-evaluated:Patient Re-evaluated prior to induction Oxygen Delivery Method: Circle system utilized Preoxygenation: Pre-oxygenation with 100% oxygen Induction Type: IV induction and Rapid sequence Laryngoscope Size: Mac and 3 Grade View: Grade I Tube type: Oral Tube size: 8.5 mm Number of attempts: 1 Airway Equipment and Method: Stylet Placement Confirmation: ETT inserted through vocal cords under direct vision,  positive ETCO2 and breath sounds checked- equal and bilateral Secured at: 20 cm Tube secured with: Tape Dental Injury: Teeth and Oropharynx as per pre-operative assessment

## 2019-06-25 NOTE — Op Note (Signed)
     [address]       [place], [address]             [phone]        06/25/2019  PATIENT:  Holly [name] PRE-OPERATIVE DIAGNOSIS:   Left upper lobe pulmonary nodule      Right middle lobe pulmonary nodule POST-OPERATIVE DIAGNOSIS:   same  PROCEDURE:  Procedure(s): VIDEO BRONCHOSCOPY WITH ENDOBRONCHIAL NAVIGATION (N/A)  SURGEON:  Surgeon(s) and Role:    * Deliyah Muckle, Lucile Crater, MD - Primary  ANESTHESIA:   General EBL:  minimal  BLOOD ADMINISTERED:none SPECIMEN:   Left upper lobe FNA, brushing, and core biopsy   Right middle lobe FNA, brushing, and core biopsy   Bronchial washing  INDICATION:  Elmarie N [id number] y.o.femalereferred for evaluation of 2 solitary pulmonary nodules that have shown interval growth. She has a long history of smoking, and currently is on supplemental oxygen.   FINDINGS: Normal endobronchial anatomy.  FNA of the left upper lobe biopsy showed adequate sampling.  RML specimens were not adequate with the first sampling.  We re-localized with a new guide, but the samples at this point were all bloody.  I elected to conclude the case at this point.   OPERATIVE TECHNIQUE: Planning for the navigational bronchoscopy was done prior to induction.  Anesthesia was induced.  Pre-operative antibiotics were dose, and the patient was prepped and draped in normal sterile fashion.  An appropriate surgical pause was performed.      Flexible fiberoptic bronchoscopy was performed via the endotracheal tube.  It revealed normal endobronchial anatomy with no endobronchial lesions to the level of the subsegmental bronchi.      The locatable guide for navigation was placed and registration was performed.  There was good correlation of the video and virtual bronchoscopy.  We began with the left upper lobe nodule.  The guide was advanced to the appropriate subsegmental bronchus and advanced to within 2 centimeters of the center of the lesion.  Fluoroscopy was  used for all sampling.  Multiple needle aspirations and brushings were performed.  The locatable guide was reinserted after every third to fourth sample to ensure continued alignment and proximity to the lesion.  While awaiting the results of the aspirations, multiple biopsies were performed.  These were sent for permanent pathology.  Next, we moved to the right middle lobe lesion.  We had a difficult time centering the lesion.  Once we were within 2cm, we began our sampling.  The results were inconclusive.  We then replaced the guide, and began our sampling again.  Unfortunately, the results were inadequate again.    The bronchoscope was reinserted and a final inspection was made.  There was no significant ongoing bleeding.  The bronchoscope was removed.  The bronchoscope was withdrawn.   The patient was extubated in the operating room and taken to the PACU in good condition.

## 2019-06-25 NOTE — Anesthesia Postprocedure Evaluation (Signed)
Anesthesia Post Note  Patient: Holly [name]  Procedure(s) Performed: VIDEO BRONCHOSCOPY WITH ENDOBRONCHIAL NAVIGATION (Bilateral )     Patient location during evaluation: PACU Anesthesia Type: General Level of consciousness: awake Pain management: pain level controlled Vital Signs Assessment: post-procedure vital signs reviewed and stable Respiratory status: spontaneous breathing Cardiovascular status: stable Postop Assessment: no apparent nausea or vomiting Anesthetic complications: no    Last Vitals:  Vitals:   06/25/19 1030 06/25/19 1033  BP:  (!) 137/58  Pulse: 77 78  Resp: 19 (!) 22  Temp: 36.6 C   SpO2: 96% 95%    Last Pain:  Vitals:   06/25/19 0635  TempSrc:   PainSc: 0-No pain                 Kaitlin Ardito

## 2019-06-26 ENCOUNTER — Encounter (HOSPITAL_COMMUNITY): Payer: Self-pay | Admitting: Thoracic Surgery (Cardiothoracic Vascular Surgery)

## 2019-06-26 LAB — PULMONARY FUNCTION TEST

## 2019-06-27 ENCOUNTER — Other Ambulatory Visit: Payer: Self-pay | Admitting: *Deleted

## 2019-06-27 NOTE — Progress Notes (Signed)
The proposed treatment discussion in cancer conference 9/10 is for discussion purpose only and is not a binding recommendation.  The patient was not physically examined nor present for their treatment options.  Therefore, final treatment plans cannot be decided.

## 2019-07-04 ENCOUNTER — Telehealth: Payer: Self-pay | Admitting: *Deleted

## 2019-07-04 ENCOUNTER — Other Ambulatory Visit: Payer: Self-pay | Admitting: *Deleted

## 2019-07-04 NOTE — Progress Notes (Signed)
The proposed treatment discussed in cancer conference 07/04/19 is for discussion purpose only and is not a binding recommendation.  The patient was not physically examined nor present for their treatment options.  Therefore, final treatment plans cannot be decided.

## 2019-07-04 NOTE — Telephone Encounter (Signed)
Oncology Nurse Navigator Documentation  Oncology Nurse Navigator Flowsheets 07/04/2019  Diagnosis Status Confirmed Diagnosis Complete  Navigator Follow Up Date: 07/10/2019  Navigator Follow Up Reason: Appointment Review  Navigator Location [facility]  Referral Date to RadOnc/MedOnc 07/04/2019  Navigator Encounter Type Telephone/I received a message from RN at [place].  Patient is confused about treatment plan.  I updated her about plan of care after cancer conference.  Patient is set up for Rad Onc on 9/22.  I will update RN at [place] regarding me updating patient.   Telephone Outgoing Call  Abnormal Finding Date 04/25/2019  Confirmed Diagnosis Date 06/25/2019  Treatment Phase Pre-Tx/Tx Discussion  Barriers/Navigation Needs Education  Education Other  Interventions Education  Education Method Verbal  Acuity Level 2-Minimal Needs (1-2 Barriers Identified)  Time Spent with Patient 30

## 2019-07-09 ENCOUNTER — Encounter: Payer: Self-pay | Admitting: Radiation Oncology

## 2019-07-09 ENCOUNTER — Ambulatory Visit
Admission: RE | Admit: 2019-07-09 | Discharge: 2019-07-09 | Disposition: A | Discharge status: Home / Self Care | Payer: Medicare Other | Source: Ambulatory Visit | Attending: Radiation Oncology | Admitting: Radiation Oncology

## 2019-07-09 ENCOUNTER — Other Ambulatory Visit: Payer: Self-pay

## 2019-07-09 ENCOUNTER — Ambulatory Visit: Payer: Medicare Other | Admitting: Radiation Oncology

## 2019-07-09 VITALS — Ht 60.0 in | Wt 170.0 lb

## 2019-07-09 DIAGNOSIS — Z87891 Personal history of nicotine dependence: Secondary | ICD-10-CM | POA: Diagnosis not present

## 2019-07-09 DIAGNOSIS — C3411 Malignant neoplasm of upper lobe, right bronchus or lung: Secondary | ICD-10-CM | POA: Diagnosis not present

## 2019-07-09 DIAGNOSIS — Z9981 Dependence on supplemental oxygen: Secondary | ICD-10-CM | POA: Diagnosis not present

## 2019-07-09 DIAGNOSIS — R911 Solitary pulmonary nodule: Secondary | ICD-10-CM | POA: Diagnosis not present

## 2019-07-09 DIAGNOSIS — C3412 Malignant neoplasm of upper lobe, left bronchus or lung: Secondary | ICD-10-CM | POA: Insufficient documentation

## 2019-07-09 DIAGNOSIS — J449 Chronic obstructive pulmonary disease, unspecified: Secondary | ICD-10-CM | POA: Diagnosis not present

## 2019-07-09 NOTE — Progress Notes (Signed)
Radiation Oncology         [phone] ________________________________  Initial Outpatient Consultation - Conducted via telephone due to current COVID-19 concerns for limiting patient exposure  I spoke with the patient to conduct this consult visit via telephone to spare the patient unnecessary potential exposure in the healthcare setting during the current COVID-19 pandemic. The patient was notified in advance and was offered a [place] meeting to allow for face to face communication but unfortunately reported that they did not have the appropriate resources/technology to support such a visit and instead preferred to proceed with a telephone consult.  ________________________________  Name: Holly [name]        MRN: [id number]  Date of Service: 07/09/2019 DOB: 1945/12/07  CC:Harlan Stains, MD  Lajuana Matte, MD     REFERRING PHYSICIAN: Lajuana Matte, MD   DIAGNOSIS: The encounter diagnosis was Primary malignant neoplasm of right upper lobe of lung ([place]).   HISTORY OF PRESENT ILLNESS: Holly [name] is a 73 y.o. female seen at the request of Dr. Kipp Brood with a history of COPD and she has been oxygen dependant at 2L [place] for the last two years. She was previously a patient of Dr. Lake Bells, but following pneumonia that required admission at [facility] about 2 years ago when she started needing O2, she continued with Dr. Alcide Clever. She was recently found on CT imaging in July 2020 to have a 12 x 8 mm left upper lobe nodule and a nodule in the RML measuring 13 x 7 mm. She underwent a PET scan on 06/05/2019 revealed hypermetabolic change with an SUV of 8.5 in the LUL and this measured 12 mm, and in the RML the nodule was 13 mm, and had an SUV of 1.4. She did not have any hypermetabolic adenopathy. A left adrenal adenoma was noted as well and was not hypermetabolic. She underwent navigational bronchoscopy on 06/25/2019 with Dr. 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 The RML lesion was also sampled, and atypical cells were noted but not diagnostic of cancer. She was discussed in [facility] conference and she did not appear to be a good candidate for surgical resection, and rather that she would benefit from SBRT to the LUL and probably follow the RML with serial imaging prior to intervening with treatment. She is contacted by phone today to discuss treatment recommendations.   PREVIOUS RADIATION THERAPY: No   PAST MEDICAL HISTORY:  Past Medical History:  Diagnosis Date   Anxiety    Asthma    Basal cell carcinoma    Bruises easily    Cataract    Cervicalgia    COPD (chronic obstructive pulmonary disease) (HCC)    emphysema and COPD   Dyspnea    uses O2 only when needed. 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   Hypercalcemia    Hypertension    Hypothyroidism    Mixed hyperlipidemia    Pneumonia    Pulmonary nodule    Sleep apnea    does not wear cpap   Thyroid neoplasm    Urinary incontinence    Wears dentures    Wears glasses        PAST SURGICAL HISTORY: Past Surgical History:  Procedure Laterality Date   COLONOSCOPY     DENTAL SURGERY     ELBOW ARTHROPLASTY Right 2020  EYE SURGERY Right    cataract surgery   FOOT SURGERY  30 YRS AGO   BILATERAL   ORIF ELBOW FRACTURE Right 12/06/2017   Procedure: OPEN REDUCTION INTERNAL FIXATION (ORIF) ELBOW/OLECRANON FRACTURE;  Surgeon: Shona Needles, MD;  Location: [facility];  Service: Orthopedics;  Laterality: Right;   SKIN CANCER EXCISION     THYROIDECTOMY N/A 07/31/2014   Procedure: TOTAL THYROIDECTOMY;  Surgeon: Armandina Gemma, MD;  Location: [facility];  Service: General;  Laterality: N/A;   VAGINAL HYSTERECTOMY     VIDEO BRONCHOSCOPY WITH ENDOBRONCHIAL NAVIGATION Bilateral 06/25/2019   Procedure: VIDEO BRONCHOSCOPY WITH ENDOBRONCHIAL NAVIGATION;  Surgeon: Lajuana Matte, MD;  Location:  [facility];  Service: Thoracic;  Laterality: Bilateral;     FAMILY HISTORY:  Family History  Problem Relation Age of Onset   Coronary artery disease Father    Hypertension Father    Asthma Father    Allergies Father    Hypertension Mother    Lung cancer Mother      SOCIAL HISTORY:  reports that she quit smoking about 4 years ago. Her smoking use included cigarettes. She has a 30.00 pack-year smoking history. She has never used smokeless tobacco. She reports current alcohol use. She reports that she does not use drugs. The patient is widowed and lives in [place]. 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   ALLERGIES: Codeine and Oxycodone-acetaminophen   MEDICATIONS:  Current Outpatient Medications  Medication Sig Dispense Refill   ALPRAZolam (XANAX) 0.25 MG tablet Take 0.25 mg by mouth 2 (two) times daily as needed for anxiety.      aspirin 81 MG tablet Take 81 mg by mouth daily.     atorvastatin (LIPITOR) 20 MG tablet Take 20 mg by mouth at bedtime.      buPROPion (WELLBUTRIN XL) 300 MG 24 hr tablet Take 300 mg by mouth daily.     busPIRone (BUSPAR) 30 MG tablet Take 30 mg by mouth 2 (two) times daily.     Fluticasone-Umeclidin-Vilant (TRELEGY ELLIPTA) 100-62.5-25 MCG/INH AEPB Inhale 1 puff into the lungs daily.     levothyroxine (SYNTHROID) 112 MCG tablet Take 112 mcg by mouth daily before breakfast. Once daily  0   metoprolol succinate (TOPROL-XL) 50 MG 24 hr tablet Take 50 mg by mouth daily. Take with or immediately following a meal.     olmesartan-hydrochlorothiazide (BENICAR HCT) 20-12.5 MG per tablet Take 1 tablet by mouth daily. (Patient not taking: Reported on 06/21/2019) 14 tablet 0   omeprazole (PRILOSEC) 40 MG capsule TAKE 1 CAPSULE BY MOUTH DAILY (Patient not taking: Reported on 06/21/2019) 30 capsule 2   Respiratory Therapy Supplies (FLUTTER) DEVI Use as directed (Patient not taking: Reported on 07/09/2019) 1 each 0   tiotropium (SPIRIVA) 18 MCG  inhalation capsule Place 1 capsule (18 mcg total) into inhaler and inhale daily. (Patient not taking: Reported on 06/21/2019) 30 capsule 5   tiotropium (SPIRIVA) 18 MCG inhalation capsule Place 1 capsule (18 mcg total) into inhaler and inhale daily. (Patient not taking: Reported on 06/21/2019) 30 capsule 6   valsartan-hydrochlorothiazide (DIOVAN-HCT) 160-12.5 MG tablet Take 1 tablet by mouth daily.     VENTOLIN HFA 108 (90 Base) MCG/ACT inhaler INHALE 2 PUFFS INTO THE LUNGS EVERY 4 HOURS AS NEEDED FOR WHEEZING ORSHORTNESS OF BREATH (Patient taking differently: Inhale 2 puffs into the lungs every 4 (four) hours as needed for wheezing or shortness of breath. ) 8 g 0   No current facility-administered medications for this encounter.  REVIEW OF SYSTEMS: On review of systems, the patient reports that she is doing well overall. She reports her O2 demands stay at 2L continuously. She had a few days of hemoptysis following her biopsies. She denies any persistence of this. She denies any chest pain, shortness of breath at rest, cough, fevers, chills, night sweats, unintended weight changes. She denies any bowel or bladder disturbances, and denies abdominal pain, nausea or vomiting. She denies any new musculoskeletal or joint aches or pains. A complete review of systems is obtained and is otherwise negative.     PHYSICAL EXAM:  Wt Readings from Last 3 Encounters:  07/09/19 170 lb (77.1 kg)  06/25/19 170 lb (77.1 kg)  06/21/19 170 lb (77.1 kg)   Unable to assess due to encounter type.    ECOG = 0  0 - Asymptomatic (Fully active, able to carry on all predisease activities without restriction)  1 - Symptomatic but completely ambulatory (Restricted in physically strenuous activity but ambulatory and able to carry out work of a light or sedentary nature. For example, light housework, office work)  2 - Symptomatic, <50% in bed during the day (Ambulatory and capable of all self care but unable to  carry out any work activities. Up and about more than 50% of waking hours)  3 - Symptomatic, >50% in bed, but not bedbound (Capable of only limited self-care, confined to bed or chair 50% or more of waking hours)  4 - Bedbound (Completely disabled. Cannot carry on any self-care. Totally confined to bed or chair)  5 - Death   Eustace Pen MM, Creech RH, Tormey DC, et al. [id number]). "Toxicity and response criteria of the [facility] Group". [place] Oncol. 5 (6): 649-55    LABORATORY DATA:  Lab Results  Component Value Date   WBC 7.5 06/25/2019   HGB 13.6 06/25/2019   HCT 43.2 06/25/2019   MCV 101.2 (H) 06/25/2019   PLT 154 06/25/2019   Lab Results  Component Value Date   NA 139 06/25/2019   K 5.1 06/25/2019   CL 100 06/25/2019   CO2 32 06/25/2019   Lab Results  Component Value Date   ALT 15 06/25/2019   AST 38 06/25/2019   ALKPHOS 96 06/25/2019   BILITOT 1.3 (H) 06/25/2019      RADIOGRAPHY: Dg Chest 2 View  Result Date: 06/25/2019 CLINICAL DATA:  73 year old female preoperative study for lung biopsy. EXAM: CHEST - 2 VIEW COMPARISON:  Chest CT 07/11/2019 and earlier. FINDINGS: 10-12 millimeter bilateral pulmonary nodules were better demonstrated by CT, that on the right is faintly apparent by x-ray (arrow). Lung volumes and mediastinal contours remain normal. Visualized tracheal air column is within normal limits. Upper lobe attenuation of bronchovascular markings compatible with centrilobular emphysema by CT. No pneumothorax, pleural effusion or new pulmonary opacity. No acute osseous abnormality identified. Negative visible bowel gas pattern. IMPRESSION: Emphysema with bilateral lung nodules better demonstrated on the recent CT. No new cardiopulmonary abnormality. Electronically Signed   By: Genevie Fredrika M.D.   On: 06/25/2019 07:18   Ct Super D Chest Wo Contrast  Result Date: 06/21/2019 CLINICAL DATA:  Pulmonary nodule.  Bronchoscopy planning. EXAM: CT CHEST WITHOUT  CONTRAST TECHNIQUE: Multidetector CT imaging of the chest was performed using thin slice collimation for electromagnetic bronchoscopy planning purposes, without intravenous contrast. COMPARISON:  PET-CT scan 06/05/2019, CT scan April 25, 2011 FINDINGS: Cardiovascular: Coronary artery calcification and aortic atherosclerotic calcification. Mediastinum/Nodes: No axillary or supraclavicular adenopathy. No mediastinal adenopathy no  pericardial effusion. Esophagus normal Lungs/Pleura: Within the LEFT upper lobe lobular nodule measuring 10 mm (image 62/3 comparison 9 mm for no significant change. Small cavitary/peribronchial nodule in the LEFT upper lobe measuring 5 mm (image 32/3) Is unchanged. There is mild atelectasis more inferior lingula. In the RIGHT middle lobe elongated irregular nodule measuring 13 mm (image 70/3 compares with 12 mm. Centrilobular emphysema within the lung apices. Upper Abdomen: Limited view of the liver, kidneys, pancreas are unremarkable. Normal adrenal glands. Musculoskeletal: No aggressive osseous lesion. IMPRESSION: 1. LEFT upper lobe pulmonary nodule (10 mm) not significant changed from CT 04/25/2019. 2. LEFT upper lobe pulmonary nodule nodule (5 mm) not significantly changed. 3. RIGHT middle lobe pulmonary nodule not significant changed from CT 04/25/2019. 4. Centrilobular emphysema the upper lobes. Electronically Signed   By: Suzy Bouchard M.D.   On: 06/21/2019 12:22   Dg C-arm Bronchoscopy  Result Date: 06/25/2019 C-ARM BRONCHOSCOPY: Fluoroscopy was utilized by the requesting physician.  No radiographic interpretation.       IMPRESSION/PLAN: 1. Stage IA2, cT1bN0M0, NSCLC, squamous cell carcinoma of the LUL. Dr. Lisbeth Renshaw discusses the pathology findings and reviews the nature of early stage lung cancer. We discussed that surgery is the gold standard to cure these cancers, but that in patients who are not candidates for resection, that stereotactic body radiotherapy (SBRT) would be a  good alternative. We discussed the risks, benefits, short, and long term effects of radiotherapy, and the patient is interested in proceeding. Dr. Lisbeth Renshaw discusses the delivery and logistics of radiotherapy and anticipates a course of 3-5 fractions. She will be contacted to coordinate simulation and subsequent treatment.  2. RML nodule with atypical cells on biopsy but below FDG affinity on PET scan. Dr. Lisbeth Renshaw discusses the findings and recommends following this nodule on interval imaging prior to considering radiotherapy to this location. She is in agreement, but if this did enlarge, we would consider SBRT to that site as well. She is also considering relocating pulmonary care with Dr. Lake Bells following radiotherapy.   Given current concerns for patient exposure during the COVID-19 pandemic, this encounter was conducted via telephone.  The patient has given verbal consent for this type of encounter. The time spent during this encounter was 45 minutes and 50% of that time was spent in the coordination of her care. The attendants for this meeting included Blenda Nicely, RN, Dr. Lisbeth Renshaw,  Shona Simpson, [facility] and Novalee Jessee Avers  During the encounter, Blenda Nicely, RN, Dr. Lisbeth Renshaw, and Shona Simpson [facility] were located at [facility] Radiation Oncology Department.  Jaimi N Portillo  was located at home.   The above documentation reflects my direct findings during this shared patient visit. Please see the separate note by Dr. Lisbeth Renshaw on this date for the remainder of the patient's plan of care.    Carola Rhine, PAC

## 2019-07-09 NOTE — Addendum Note (Signed)
Encounter addended by: Hayden Pedro, PA-C on: 07/09/2019 10:57 AM  Actions taken: Problem List modified, Visit diagnoses modified

## 2019-07-09 NOTE — Progress Notes (Signed)
Thoracic Location of Tumor / Histology:   Patient presented   CT Chest 06/21/2019: Left upper lobe pulmonary nodule (65mm) not significantly changed from CT 04/25/2019.  Left upper lobe pulmonary nodule (53mm) not significantly changed.  Right middle lobe pulmonary nodule not significantly changed from CT 04/25/2019.    PET 06/05/2019: LUL nodule SUV 8.5.  RML nodule SUV 1.4.  Adrenal nodule no avidity.  CT Chest 04/25/2019: 1.2 cm left upper lobe nodule.  Spiculated margins.  1.3 cm right middle lobe nodule with spiculated margins.  No hilar or mediastinal lymphadenopathy.  1.5 cm left adrenal nodule.  Biopsies of Left Upper Lobe/ Right Middle Lobe 06/25/2019   Tobacco/Marijuana/Snuff/ETOH use: Former smoker  Past/Anticipated interventions by cardiothoracic surgery, if any:  Dr. Kipp Brood 06/21/2019 -She has  LUL and RML 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 -She requires a tissue diagnosis, so I have  Recommended that she undergo a navigational bronchoscopy on 9/8. -Given her poor lung function, she is not a candidate for surgical resection in the future. -Her case will be discussed in tumor board, and she will be considered for chemotherapy and SBRT if the biopsy results show malignancy.  Past/Anticipated interventions by medical oncology, if any:  No appointment scheduled   Signs/Symptoms  Weight changes, if any: Stable  Respiratory complaints, if any: SOB with activity  Hemoptysis, if any: No  Pain issues, if any:  None  SAFETY ISSUES:  Prior radiation? No  Pacemaker/ICD? No  Possible current pregnancy? Hysterectomy  Is the patient on methotrexate? No  Current Complaints / other details:   -Wears Oxygen- 2L

## 2019-07-11 ENCOUNTER — Encounter: Payer: Self-pay | Admitting: General Practice

## 2019-07-11 NOTE — Progress Notes (Signed)
[place] Psychosocial Distress Screening Clinical Social Work  Clinical Social Work was referred by distress screening protocol.  The patient scored a 5 on the Psychosocial Distress Thermometer which indicates moderate distress. Clinical Social Worker contacted patient by phone to assess for distress and other psychosocial needs. Lives alone, widowed. Husband died of esophageal cancer.  Daughter is very supportive, wants to come to first appt w her so daughter can be aware of treatment plan and requirements.  Patient grateful to learn that her disease is Stage I based on recent phone meeting w oncologist.  Aware that she will meet w medical oncologist next week and learn more.  "I am actually grateful", although not 'grateful 'she has cancer, she is grateful that her disease is manageable and has a short treatment regimen. Has family member who died of lung cancer.  No concerns re transportation or help at home- daughter very supportive.  Wants information on [place] and hopes to participate.  Aware she does not eat well - she lives alone and cooking can be somewhat burdensome.  Encouraged to consider participating in cooking class offered by [employer].    ONCBCN DISTRESS SCREENING 07/09/2019  Screening Type Initial Screening  Distress experienced in past week (1-10) 5  Family Problem type Other (comment)    Clinical Social Worker follow up needed: No.  If yes, follow up plan:  Beverely Pace, [place], [place] Social Worker Phone:  [phone] Cell:  [phone]

## 2019-07-17 ENCOUNTER — Other Ambulatory Visit: Payer: Self-pay

## 2019-07-17 ENCOUNTER — Ambulatory Visit
Admission: RE | Admit: 2019-07-17 | Discharge: 2019-07-17 | Disposition: A | Discharge status: Home / Self Care | Payer: Medicare Other | Source: Ambulatory Visit | Attending: Radiation Oncology | Admitting: Radiation Oncology

## 2019-07-17 DIAGNOSIS — C3412 Malignant neoplasm of upper lobe, left bronchus or lung: Secondary | ICD-10-CM | POA: Insufficient documentation

## 2019-07-19 DIAGNOSIS — C3412 Malignant neoplasm of upper lobe, left bronchus or lung: Secondary | ICD-10-CM | POA: Diagnosis not present

## 2019-07-30 ENCOUNTER — Other Ambulatory Visit: Payer: Self-pay

## 2019-07-30 ENCOUNTER — Ambulatory Visit
Admission: RE | Admit: 2019-07-30 | Discharge: 2019-07-30 | Disposition: A | Discharge status: Home / Self Care | Payer: Medicare Other | Source: Ambulatory Visit | Attending: Radiation Oncology | Admitting: Radiation Oncology

## 2019-07-30 DIAGNOSIS — C3412 Malignant neoplasm of upper lobe, left bronchus or lung: Secondary | ICD-10-CM | POA: Diagnosis not present

## 2019-08-01 ENCOUNTER — Ambulatory Visit
Admission: RE | Admit: 2019-08-01 | Discharge: 2019-08-01 | Disposition: A | Discharge status: Home / Self Care | Payer: Medicare Other | Source: Ambulatory Visit | Attending: Radiation Oncology | Admitting: Radiation Oncology

## 2019-08-01 ENCOUNTER — Other Ambulatory Visit: Payer: Self-pay

## 2019-08-01 DIAGNOSIS — C3412 Malignant neoplasm of upper lobe, left bronchus or lung: Secondary | ICD-10-CM | POA: Diagnosis not present

## 2019-08-06 ENCOUNTER — Ambulatory Visit
Admission: RE | Admit: 2019-08-06 | Discharge: 2019-08-06 | Disposition: A | Discharge status: Home / Self Care | Payer: Medicare Other | Source: Ambulatory Visit | Attending: Radiation Oncology | Admitting: Radiation Oncology

## 2019-08-06 ENCOUNTER — Other Ambulatory Visit: Payer: Self-pay

## 2019-08-06 ENCOUNTER — Encounter: Payer: Self-pay | Admitting: Radiation Oncology

## 2019-08-06 DIAGNOSIS — C3412 Malignant neoplasm of upper lobe, left bronchus or lung: Secondary | ICD-10-CM | POA: Diagnosis not present

## 2019-08-06 NOTE — Progress Notes (Signed)
  Radiation Oncology         [phone]) [phone] ________________________________  Name: Holly [name] MRN: [id number]  Date of Service: 08/06/2019  DOB: January 19, 1946  Post Treatment  Note  Diagnosis:    Stage IA2, cT1bN0M0, NSCLC, squamous cell carcinoma of the LUL.  Interval Since Last Radiation:  Today  07/30/2019-08/06/2019 SBRT Treatment: The LUL target was treated to 54 Gy in 3 fractions.    Narrative:  The patient was seen today for post treatment visit. During treatment she did very well with radiotherapy and did not have significant desquamation. She reports she has had some evening time nausea intermittently for a few weeks. She denies any vomiting. She otherwise has felt pretty well. No other complaints are noted.  Impression/Plan: 1.         Stage IA2, cT1bN0M0, NSCLC, squamous cell carcinoma of the LUL. She has done well since completing radiotherapy. We reviewed the rationale to repeat a CT in 6 weeks to determine how she has responded to SBRT and the typical schedule of scans per guidelines. She was given precautions to call sooner if she has concerns. 2.         RML nodule with atypical cells on biopsy but below FDG affinity on PET scan. We will follow up with her CT scan in 6 weeks and if all is stable, another at 4 months prior to moving back to q 6 month scans per NCCN guidelines.  3. Plans for pulmonary follow up. She is also considering relocating pulmonary care with Dr. Lake Bells following radiotherapy due to proxomity to home for her pulmonary care and will let us know if she wants to proceed with referral. For now she will plan to see Dr. Alcide Clever in [place].  4. Evening time nausea. I encouraged the patient to try otc antacids and PPI therapy. She was in agreement and encouraged to follow up with Dr. Dema Severin if her symptoms do not respond to treatment.      Carola Rhine, PAC

## 2019-08-29 ENCOUNTER — Telehealth: Payer: Self-pay | Admitting: *Deleted

## 2019-08-29 NOTE — Telephone Encounter (Signed)
CALLED PATIENT TO INFORM OF STAT LABS ON 09-16-19 @ 11:45 AM @ [place] AND CT ON 09-16-19 - ARRIVAL TIME- 12:45 PM @ [facility], PT. TO HAVE WATER ONLY - 4 HRS. PRIOR TO TEST, PATIENT TO RECEIVE PHONE CALL FROM ALISON PERKINS ON 09-17-19 @ 8:30 AM FOR RESULTS, PATIENT VERIFIED UNDERSTANDING THIS

## 2019-09-05 NOTE — Progress Notes (Signed)
  Radiation Oncology         [phone] ________________________________  Name: Holly [name] MRN: [id number]  Date: 08/06/2019  DOB: [birth year]  End of Treatment Note  Diagnosis:   Left lung NSCLC   Indication for treatment::  curative       Radiation treatment dates:   07/30/19 - 08/06/19  Site/dose:   The patient was treated to the left lung with a course of stereotactic body radiation treatment.  The patient received 54 Gray in 3 fractions using a IMRT technique, with 3 fields.  Narrative: The patient tolerated radiation treatment relatively well.   No unexpected difficulties.  The patient's breathing did not significantly change during the course of the treatment.  Plan: The patient has completed radiation treatment. The patient will return to radiation oncology clinic for routine followup in one month. I advised the patient to call or return sooner if they have any questions or concerns related to their recovery or treatment. ________________________________  Jodelle Gross, M.D., Ph.D.

## 2019-09-06 DIAGNOSIS — E559 Vitamin D deficiency, unspecified: Secondary | ICD-10-CM | POA: Diagnosis not present

## 2019-09-06 DIAGNOSIS — R7301 Impaired fasting glucose: Secondary | ICD-10-CM | POA: Diagnosis not present

## 2019-09-06 DIAGNOSIS — E890 Postprocedural hypothyroidism: Secondary | ICD-10-CM | POA: Diagnosis not present

## 2019-09-06 DIAGNOSIS — I10 Essential (primary) hypertension: Secondary | ICD-10-CM | POA: Diagnosis not present

## 2019-09-16 ENCOUNTER — Ambulatory Visit (HOSPITAL_COMMUNITY)
Admission: RE | Admit: 2019-09-16 | Discharge: 2019-09-16 | Disposition: A | Discharge status: Home / Self Care | Payer: Medicare Other | Source: Ambulatory Visit | Attending: Radiation Oncology | Admitting: Radiation Oncology

## 2019-09-16 ENCOUNTER — Ambulatory Visit
Admission: RE | Admit: 2019-09-16 | Discharge: 2019-09-16 | Disposition: A | Discharge status: Home / Self Care | Payer: Medicare Other | Source: Ambulatory Visit | Attending: Radiation Oncology | Admitting: Radiation Oncology

## 2019-09-16 ENCOUNTER — Other Ambulatory Visit: Payer: Self-pay

## 2019-09-16 DIAGNOSIS — C3412 Malignant neoplasm of upper lobe, left bronchus or lung: Secondary | ICD-10-CM | POA: Diagnosis not present

## 2019-09-16 DIAGNOSIS — C3490 Malignant neoplasm of unspecified part of unspecified bronchus or lung: Secondary | ICD-10-CM | POA: Diagnosis not present

## 2019-09-16 LAB — BUN & CREATININE (CHCC)
BUN: 19 mg/dL (ref 8–23)
Creatinine: 0.99 mg/dL (ref 0.44–1.00)
GFR, Est AFR Am: >60 mL/min (ref >60)
GFR, Estimated: 56 mL/min — ABNORMAL LOW (ref >60)

## 2019-09-16 IMAGING — CT CT CHEST W/ CM
2 of 4 series · 15 of 36 positions shown, 18 images · IV contrast (OMNIPAQUE)
Comparison: [DATE]

CLINICAL DATA: Non-small cell lung cancer, restaging.

EXAM:
CT CHEST WITH CONTRAST
TECHNIQUE: Multidetector CT imaging of the chest was performed during
intravenous contrast administration.
CONTRAST:  75mL OMNIPAQUE IOHEXOL 300 MG/ML  SOLN

[Series 2: axial st · axial · 0.78mm/px · z∈[+1015,+1291]mm · 12 of 162 slices shown, 15 images]
[im 12/162  mediastinal]
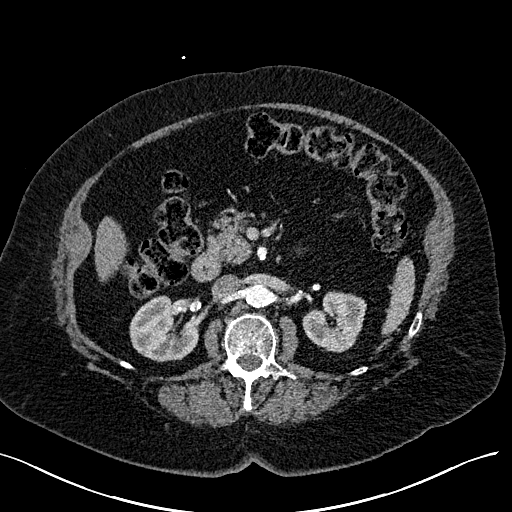
[im 12/162  lung]
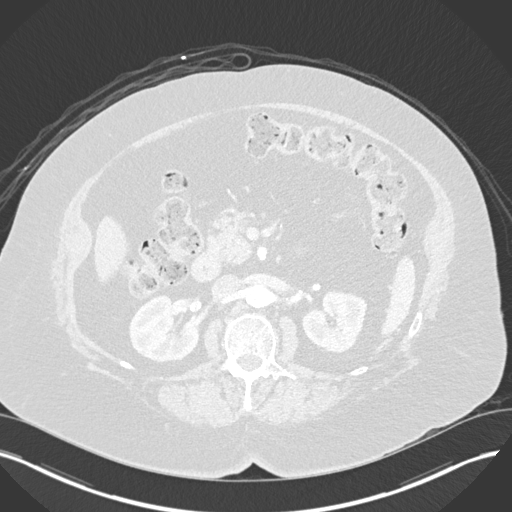
[im 24/162  lung]
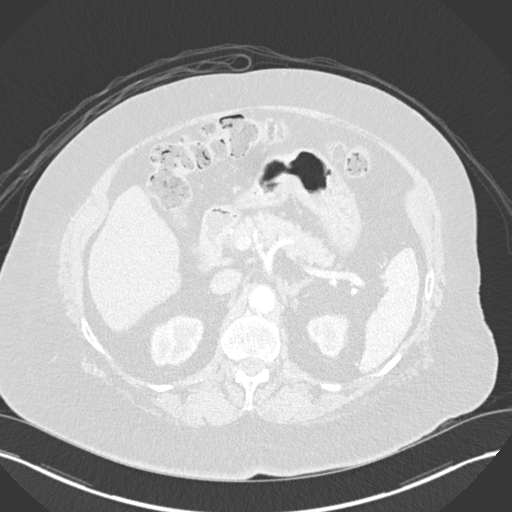
[im 35/162  lung]
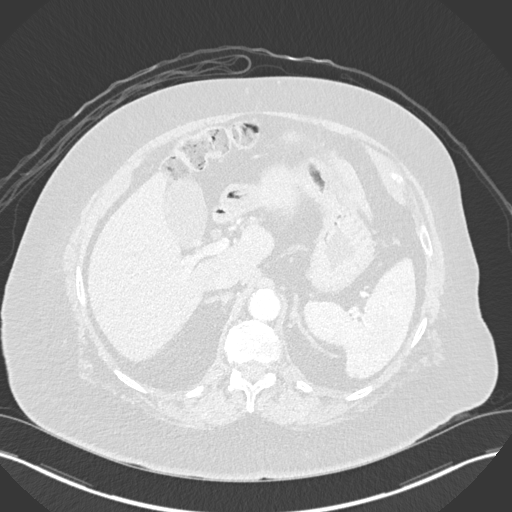
[im 47/162  lung]
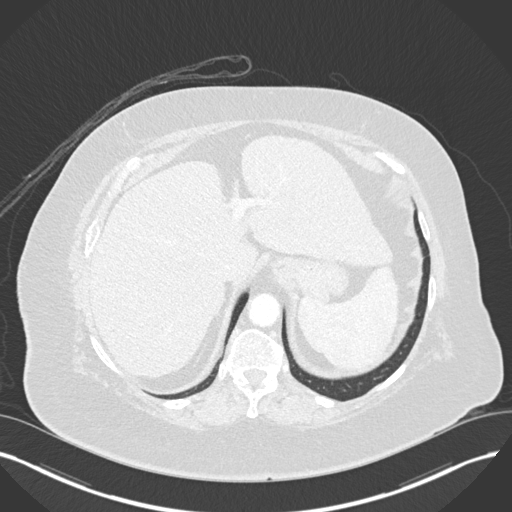
[im 58/162  mediastinal]
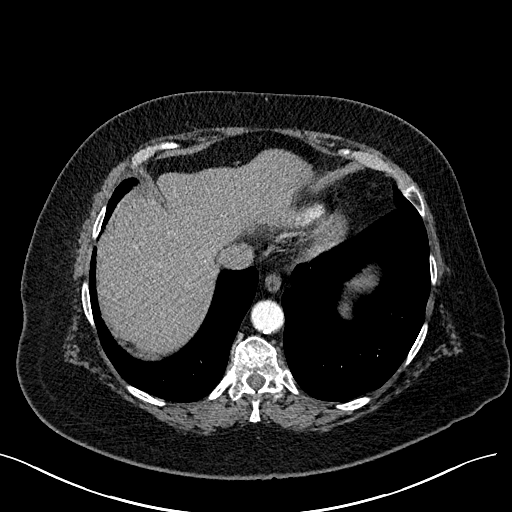
[im 58/162  lung]
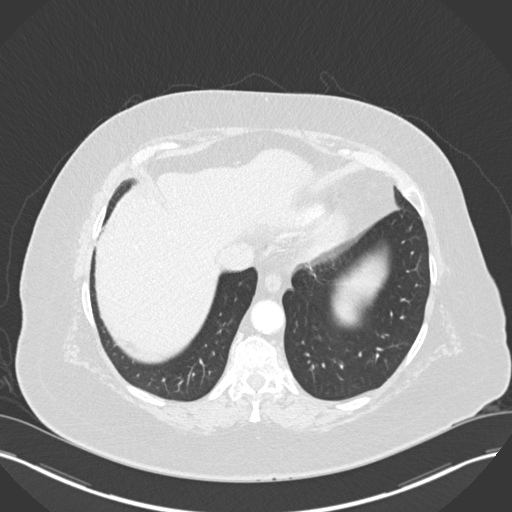
[im 70/162  lung]
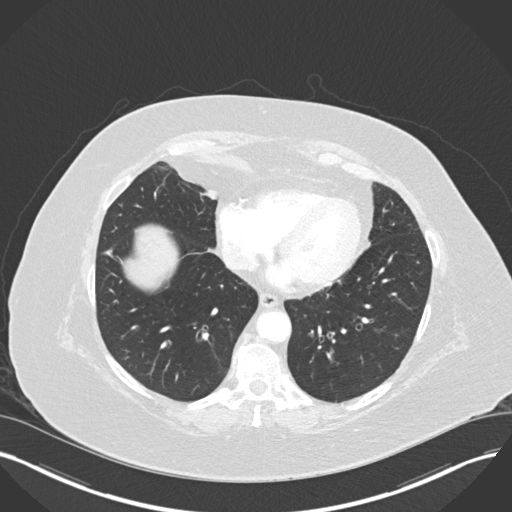
[im 93/162  lung]
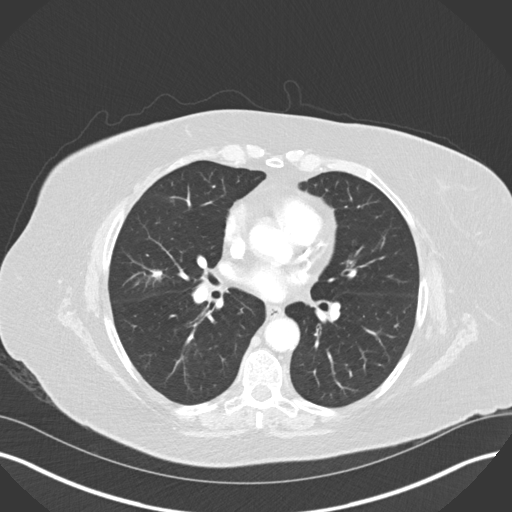
[im 104/162  lung]
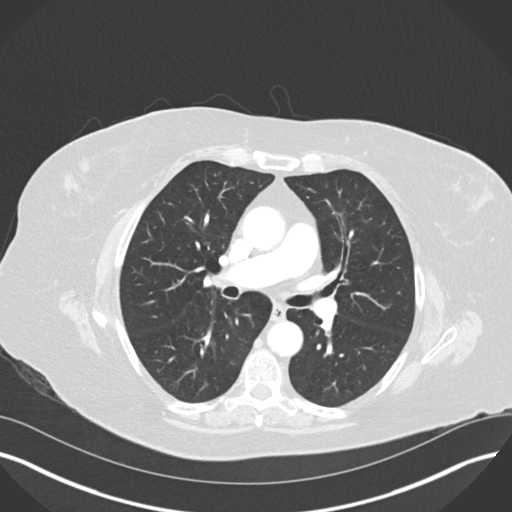
[im 116/162  mediastinal]
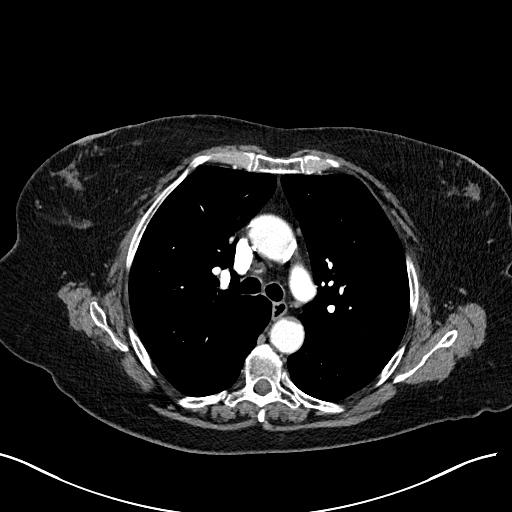
[im 116/162  lung]
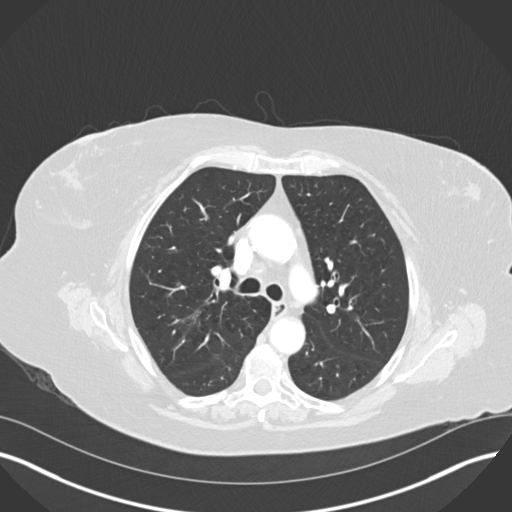
[im 127/162  lung]
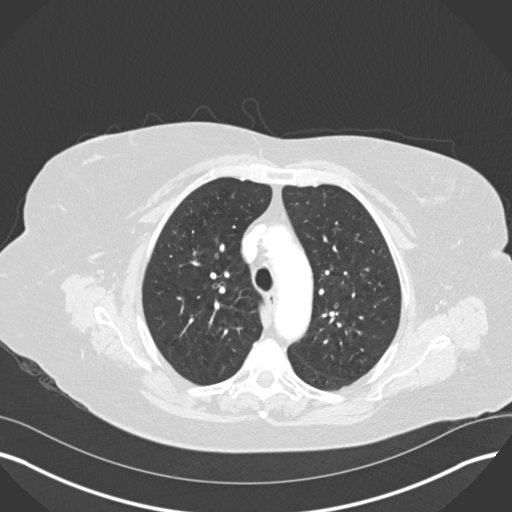
[im 139/162  lung]
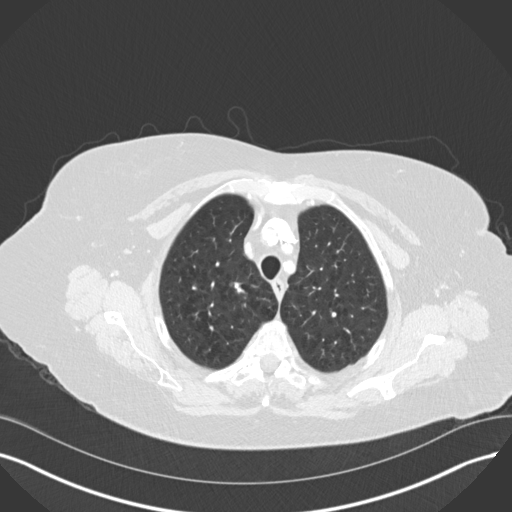
[im 150/162  lung]
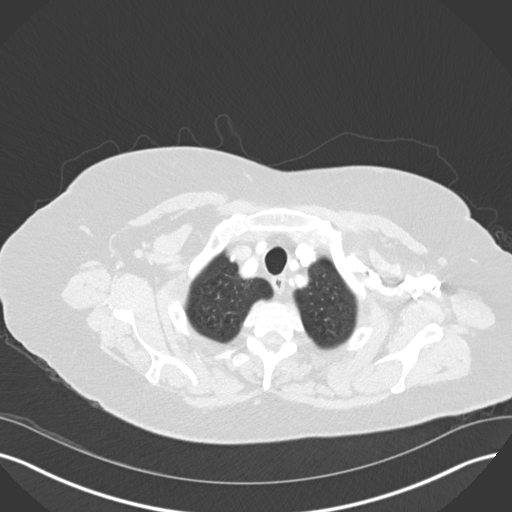

[Series 5: coronal · coronal · 0.75mm/px · 3 of 128 slices shown]
[im 26/128  lung]
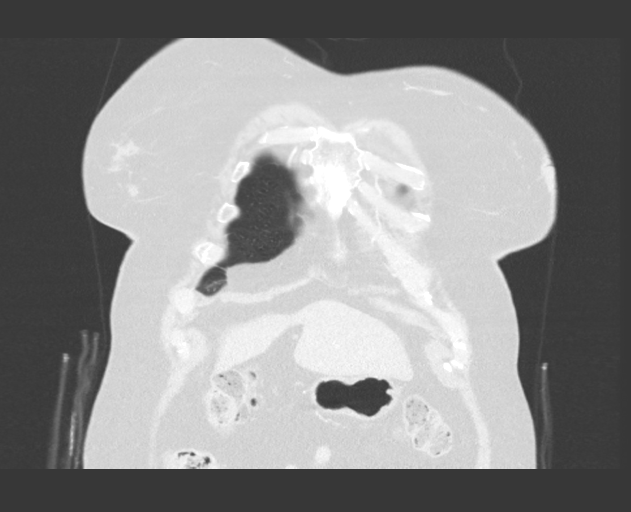
[im 51/128  lung]
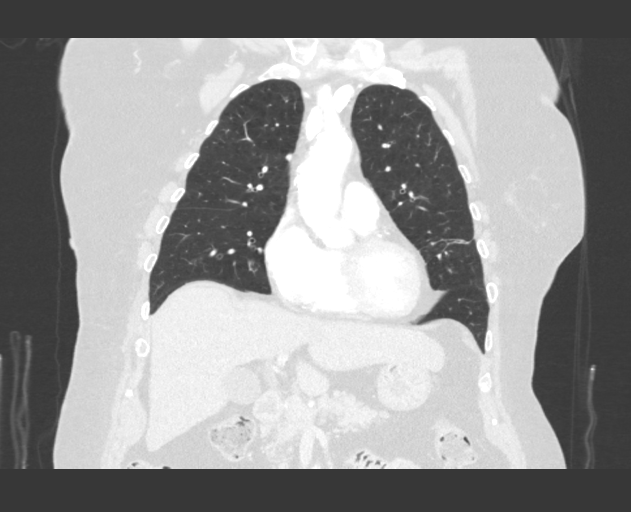
[im 77/128  lung]
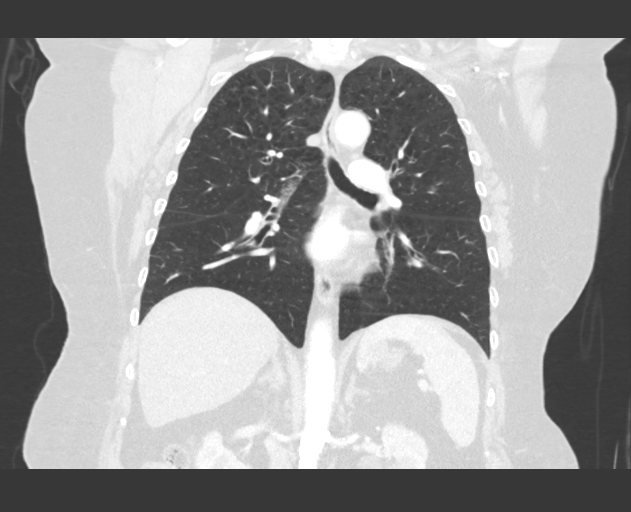

[15 of 36 positions shown; findings below may reference images not displayed]

FINDINGS: Cardiovascular: Calcific and noncalcific atherosclerosis throughout
the thoracic aorta. Calcified coronary artery disease similar prior
study. No signs of aneurysm in the chest. No pericardial effusion.
Heart size is normal.

Central pulmonary vasculature is normal.

Mediastinum/Nodes: Signs of thyroidectomy. No signs of adenopathy in
the chest.

Lungs/Pleura: Signs of pulmonary emphysema.

Perifissural nodule in the right middle lobe measures 12 x 6 mm,
previously 13 by 7 mm. (Image 67, series 7).

Left upper lobe pulmonary nodule 6 by 5 mm within 1 mm of previous
exam tracking along left upper lobe bronchi. Findings similar over
the last 2 examinations. Nodule along the left heart border
measuring approximately 10 x 4 mm measured 11 x 7 mm on the study of
[DATE]. Lingular scarring is unchanged.

Airways are patent. Right middle lobe scarring is similar to the
prior study seen medially along the right heart border. No signs of
dense consolidation or evidence of pleural effusion.

Upper Abdomen: Lobular hepatic contours. No signs of focal hepatic
lesion. No acute findings in the upper abdomen.

Musculoskeletal: No signs of acute bone finding or evidence of
destructive bone process.
IMPRESSION: 1. Decreased size of the larger left upper lobe nodules. Other
pulmonary nodules are within 1 mm of prior measurements.
2. No new suspicious pulmonary nodules.

Aortic Atherosclerosis ([TR]-[TR]) and Emphysema ([TR]-[TR]).

## 2019-09-16 MED ORDER — IOHEXOL 300 MG/ML  SOLN
75.0000 mL | Freq: Once | INTRAMUSCULAR | Status: AC | PRN
  Administered 2019-09-16: 75 mL via INTRAVENOUS

## 2019-09-16 MED ORDER — SODIUM CHLORIDE (PF) 0.9 % IJ SOLN
INTRAMUSCULAR | Status: AC
  Filled 2019-09-16: qty 50

## 2019-09-17 ENCOUNTER — Encounter: Payer: Self-pay | Admitting: Radiation Oncology

## 2019-09-17 ENCOUNTER — Other Ambulatory Visit: Payer: Self-pay

## 2019-09-17 ENCOUNTER — Ambulatory Visit
Admission: RE | Admit: 2019-09-17 | Discharge: 2019-09-17 | Disposition: A | Discharge status: Home / Self Care | Payer: Medicare Other | Source: Ambulatory Visit | Attending: Radiation Oncology | Admitting: Radiation Oncology

## 2019-09-17 DIAGNOSIS — C3412 Malignant neoplasm of upper lobe, left bronchus or lung: Secondary | ICD-10-CM | POA: Insufficient documentation

## 2019-09-17 NOTE — Addendum Note (Signed)
Encounter addended by: Hayden Pedro, PA-C on: 09/17/2019 8:56 AM  Actions taken: Order list changed, Diagnosis association updated

## 2019-09-17 NOTE — Patient Instructions (Signed)
Coronavirus (COVID-19) Are you at risk?  Are you at risk for the Coronavirus (COVID-19)?  To be considered HIGH RISK for Coronavirus (COVID-19), you have to meet the following criteria:  . Traveled to [place], [place], [place], [place] or [place]; or in the [place] to [place], [place], [place], or [place]; and have fever, cough, and shortness of breath within the last 2 weeks of travel OR . Been in close contact with a person diagnosed with COVID-19 within the last 2 weeks and have fever, cough, and shortness of breath . IF YOU DO NOT MEET THESE CRITERIA, YOU ARE CONSIDERED LOW RISK FOR COVID-19.  What to do if you are HIGH RISK for COVID-19?  . If you are having a medical emergency, call 911. . Seek medical care right away. Before you go to a doctor's office, urgent care or emergency department, call ahead and tell them about your recent travel, contact with someone diagnosed with COVID-19, and your symptoms. You should receive instructions from your physician's office regarding next steps of care.  . When you arrive at healthcare provider, tell the healthcare staff immediately you have returned from visiting [place], [place], [place], [place] or [place]; or traveled in the [place] to [place], [place], [place], or [place]; in the last two weeks or you have been in close contact with a person diagnosed with COVID-19 in the last 2 weeks.   . Tell the health care staff about your symptoms: fever, cough and shortness of breath. . After you have been seen by a medical provider, you will be either: o Tested for (COVID-19) and discharged home on quarantine except to seek medical care if symptoms worsen, and asked to  - Stay home and avoid contact with others until you get your results (4-5 days)  - Avoid travel on public transportation if possible (such as bus, train, or airplane) or o Sent to the Emergency Department by EMS for evaluation, COVID-19 testing, and possible  admission depending on your condition and test results.  What to do if you are LOW RISK for COVID-19?  Reduce your risk of any infection by using the same precautions used for avoiding the common cold or flu:  . Wash your hands often with soap and warm water for at least 20 seconds.  If soap and water are not readily available, use an alcohol-based hand sanitizer with at least 60% alcohol.  . If coughing or sneezing, cover your mouth and nose by coughing or sneezing into the elbow areas of your shirt or coat, into a tissue or into your sleeve (not your hands). . Avoid shaking hands with others and consider head nods or verbal greetings only. . Avoid touching your eyes, nose, or mouth with unwashed hands.  . Avoid close contact with people who are sick. . Avoid places or events with large numbers of people in one location, like concerts or sporting events. . Carefully consider travel plans you have or are making. . If you are planning any travel outside or inside the [place], visit the CDC's Travelers' Health webpage for the latest health notices. . If you have some symptoms but not all symptoms, continue to monitor at home and seek medical attention if your symptoms worsen. . If you are having a medical emergency, call 911.   ADDITIONAL HEALTHCARE OPTIONS FOR PATIENTS  [facility] / e-Visit: [email or web address]         [facility]: [phone]  [facility]: [phone]                   [facility]: [phone]   

## 2019-09-17 NOTE — Progress Notes (Signed)
Radiation Oncology         (336) [phone] ________________________________  Outpatient Follow Up - Conducted via telephone due to current COVID-19 concerns for limiting patient exposure  I spoke with the patient to conduct this consult visit via telephone to spare the patient unnecessary potential exposure in the healthcare setting during the current COVID-19 pandemic. The patient was notified in advance and was offered a [facility] meeting to allow for face to face communication but unfortunately reported that they did not have the appropriate resources/technology to support such a visit and instead preferred to proceed with a telephone visit.    ________________________________  Name: Holly [name] MRN: [id number]  Date of Service: 09/17/2019  DOB: 18-May-1946  Post Treatment  Note  Diagnosis:    Stage IA2, cT1bN0M0, NSCLC, squamous cell carcinoma of the LUL.  Interval Since Last Radiation:  Today  07/30/2019-08/06/2019 SBRT Treatment: The LUL target was treated to 54 Gy in 3 fractions.    Narrative:  Holly [name] is a pleasant 73 y.o. female originally seen at the request of Dr. Kipp Brood with a history of COPD, oxygen dependant at 2L [place] for the last two years. She was previously a patient of Dr. Lake Bells, but following pneumonia that required admission at [facility] about 2 years ago when she started needing O2, she continued with Dr. Alcide Clever. She was recently found on CT imaging in July 2020 to have a 12 x 8 mm left upper lobe nodule and a nodule in the RML measuring 13 x 7 mm. She underwent a PET scan on 06/05/2019 revealed hypermetabolic change with an SUV of 8.5 in the LUL and this measured 12 mm, and in the RML the nodule was 13 mm, and had an SUV of 1.4. She did not have any hypermetabolic adenopathy. A left adrenal adenoma was noted as well and was not hypermetabolic. She underwent navigational bronchoscopy on 06/25/2019 with Dr. 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 The RML lesion was also sampled, and atypical cells were noted but not diagnostic of cancer. She was discussed in [facility] conference and she did not appear to be a good candidate for surgical resection, and rather that she would benefit from SBRT to the LUL and probably follow the RML with serial imaging prior to intervening with treatment.  She did proceed with stereotactic body radiotherapy which she completed on 08/06/2019 to the left upper lobe.  Her posttreatment imaging was repeated yesterday, and the right middle lobe lesion appears to be stable measuring 12 x 6 mm, previously 13 x 7 mm.  The left upper lobe nodule that was treated measures 10 x 4 mm in comparison to 11 x 7 mm previously.  There is also a 6 x 5 mm left upper lobe nodule that has been stable.  She is contacted to review these results.  On review of systems, the patient reports that she is doing well overall. She denies any chest pain, shortness of breath, cough, fevers, chills, night sweats, unintended weight changes.  Having said this she does continue with her oxygen.  She denies any bowel or bladder disturbances, and denies abdominal pain, nausea or vomiting. She denies any new musculoskeletal or joint aches or pains, new skin lesions or concerns. A complete review of systems is obtained and is otherwise negative.   Past Medical History:  Past Medical History:  Diagnosis Date  . Anxiety   . Asthma   . Basal cell carcinoma   .  Bruises easily   . Cataract   . Cervicalgia   . 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 Dyspnea    uses O2 only when needed. Uses 2 L   . Frequency of urination   . Headache   . 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 Hypercalcemia   . Hypertension   . Hypothyroidism   . Mixed hyperlipidemia   . Pneumonia   . Pulmonary nodule   . Sleep apnea    does not wear cpap  . Thyroid neoplasm   . Urinary incontinence   . Wears  dentures   . Wears glasses     Past Surgical History: Past Surgical History:  Procedure Laterality Date  . COLONOSCOPY    . DENTAL SURGERY    . ELBOW ARTHROPLASTY Right 2020  . EYE SURGERY Right    cataract surgery  . FOOT SURGERY  30 YRS AGO   BILATERAL  . ORIF ELBOW FRACTURE Right 12/06/2017   Procedure: OPEN REDUCTION INTERNAL FIXATION (ORIF) ELBOW/OLECRANON FRACTURE;  Surgeon: Shona Needles, MD;  Location: [facility];  Service: Orthopedics;  Laterality: Right;  . SKIN CANCER EXCISION    . THYROIDECTOMY N/A 07/31/2014   Procedure: TOTAL THYROIDECTOMY;  Surgeon: Armandina Gemma, MD;  Location: [facility];  Service: General;  Laterality: N/A;  . VAGINAL HYSTERECTOMY    . VIDEO BRONCHOSCOPY WITH ENDOBRONCHIAL NAVIGATION Bilateral 06/25/2019   Procedure: VIDEO BRONCHOSCOPY WITH ENDOBRONCHIAL NAVIGATION;  Surgeon: Lajuana Matte, MD;  Location: [facility];  Service: Thoracic;  Laterality: Bilateral;    Social History:  Social History   Socioeconomic History  . Marital status: Widowed    Spouse name: Not on file  . Number of children: 2  . Years of education: Not on file  . Highest education level: Not on file  Occupational History  . Occupation: Animal nutritionist  Social Needs  . Financial resource strain: Not on file  . Food insecurity    Worry: Not on file    Inability: Not on file  . Transportation needs    Medical: No    Non-medical: No  Tobacco Use  . Smoking status: Former Smoker    Packs/day: 1.00    Years: 30.00    Pack years: 30.00    Types: Cigarettes    Quit date: 02/24/2015    Years since quitting: 4.5  . Smokeless tobacco: Never Used  Substance and Sexual Activity  . Alcohol use: Yes    Alcohol/week: 0.0 standard drinks    Comment: very seldom  . Drug use: Never  . Sexual activity: Not on file  Lifestyle  . Physical activity    Days per week: Not on file    Minutes per session: Not on file  . Stress: Not on file  Relationships  . Social Product manager on phone: Not on file    Gets together: Not on file    Attends religious service: Not on file    Active member of club or organization: Not on file    Attends meetings of clubs or organizations: Not on file    Relationship status: Not on file  . Intimate partner violence    Fear of current or ex partner: Not on file    Emotionally abused: Not on file    Physically abused: Not on file    Forced sexual activity: Not on file  Other Topics Concern  .  Not on file  Social History Narrative  . Not on file  The patient is widowed and lives in [place] in [place].  Family History: Family History  Problem Relation Age of Onset  . Coronary artery disease Father   . Hypertension Father   . Asthma Father   . Allergies Father   . Hypertension Mother   . Lung cancer Mother      Medications: Current Outpatient Medications  Medication Sig Dispense Refill  . ALPRAZolam (XANAX) 0.25 MG tablet Take 0.25 mg by mouth 2 (two) times daily as needed for anxiety.     Marland Kitchen aspirin 81 MG tablet Take 81 mg by mouth daily.    Marland Kitchen atorvastatin (LIPITOR) 20 MG tablet Take 20 mg by mouth at bedtime.     Marland Kitchen buPROPion (WELLBUTRIN XL) 300 MG 24 hr tablet Take 300 mg by mouth daily.    . busPIRone (BUSPAR) 30 MG tablet Take 30 mg by mouth 2 (two) times daily.    . Fluticasone-Umeclidin-Vilant (TRELEGY ELLIPTA) 100-62.5-25 MCG/INH AEPB Inhale 1 puff into the lungs daily.    Marland Kitchen levothyroxine (SYNTHROID) 112 MCG tablet Take 112 mcg by mouth daily before breakfast. Once daily  0  . metoprolol succinate (TOPROL-XL) 50 MG 24 hr tablet Take 50 mg by mouth daily. Take with or immediately following a meal.    . VENTOLIN HFA 108 (90 Base) MCG/ACT inhaler INHALE 2 PUFFS INTO THE LUNGS EVERY 4 HOURS AS NEEDED FOR WHEEZING ORSHORTNESS OF BREATH (Patient taking differently: Inhale 2 puffs into the lungs every 4 (four) hours as needed for wheezing or shortness of breath. ) 8 g 0   No current facility-administered  medications for this encounter.      Allergies:  Allergies  Allergen Reactions  . Codeine Nausea And Vomiting  . Oxycodone-Acetaminophen Nausea And Vomiting   Physical Exam: Unable to assess due to encounter type.  Impression/Plan: 1.         Stage IA2, cT1bN0M0, NSCLC, squamous cell carcinoma of the LUL.  I spoke with the patient and discussed the findings from her recent CT scan on 09/16/2019.  Dr. Lisbeth Renshaw has also reviewed these results and agrees with proceeding with continued surveillance.  The patient is in agreement with this plan, and we discussed that the ultimate goal is to avoid progression and treat any additional nodules that are suspicious.  She is also reminded of the long-term side effects of rib fracture with radiotherapy.   2.         RML nodule with atypical cells on biopsy but below FDG affinity on PET scan.  Fortunately the right middle lobe nodule continues to remain the main stable.  As above Dr. Lisbeth Renshaw has reviewed the scans, and has recommended continued surveillance.  We will proceed with a repeat CT of the chest in 4 months with the anticipation of moving to q 6 month scans subsequently per NCCN guidelines.  3. Plans for pulmonary follow up.  The patient has been contemplating whether she wants to keep her pulmonary care in [place] or come back to [place].  She does not like the hospital system or the radiology department in [place] however is satisfied with her care that she receives with Dr. Alcide Clever in [place].  She is planning to follow-up with him. 4. Evening time nausea.  This has resolved with the administration of PPIs and over-the-counter antacids but the patient will still continue to follow-up with her PCP Dr. Dema Severin. Given current concerns for patient  exposure during the COVID-19 pandemic, this encounter was conducted via telephone.  The patient has given verbal consent for this type of encounter. The time spent during this encounter was 15 minutes and 50%  of that time was spent in the coordination of her care. The attendants for this meeting include Shona Simpson, [facility] and Makella Jessee Avers  During the encounter, Shona Simpson [facility] was located at [facility] Radiation Oncology Department.  Amena N Haser  was located at home.    Carola Rhine, PAC

## 2019-09-17 NOTE — Progress Notes (Signed)
Spoke with patient via phone she has no issues or complaints of at this time.

## 2019-09-17 NOTE — Progress Notes (Signed)
[facility] Radiation [facility] Simulation and Treatment Planning Note   Name:  Holly [name] MRN: [id number]   Date: 07/17/2019  DOB: [birth year]  Status:outpatient    DIAGNOSIS:    ICD-10-CM   1. Primary malignant neoplasm of left upper lobe of lung (HCC)  C34.12      CONSENT VERIFIED:yes   SET UP: Patient is setup supine   IMMOBILIZATION: The patient was immobilized using a customized Vac Loc bag/ blue bag and customized accuform device   NARRATIVE:The patient was brought to the [facility].  Identity was confirmed.  All relevant records and images related to the planned course of therapy were reviewed.  Then, the patient was positioned in a stable reproducible clinical set-up for radiation therapy. Abdominal compression was applied by me.  4D CT images were obtained and reproducible breathing pattern was confirmed. Free breathing CT images were obtained.  Skin markings were placed.  The CT images were loaded into the planning software where the target and avoidance structures were contoured.  The radiation prescription was entered and confirmed.    TREATMENT PLANNING NOTE:  Treatment planning then occurred. I have requested : IMRT planning.This treatment technique is medically necessary due to the high-dose of radiation delivered to the target region which is in close proximity to adjacent critical normal structures.  3 dimensional simulation is performed and dose volume histogram of the gross tumor volume, planning tumor volume and criticial normal structures including the spinal cord and lungs were analyzed and requested.  Special treatment procedure was performed due to high dose per fraction.  The patient will be monitored for increased risk of toxicity.  Daily imaging using cone beam CT will be used for target localization.  I anticipate that the patient will receive 54 Gy in 3 fractions to target volume. Further adjustments will be made  based on the planning process is necessary.  ------------------------------------------------  Jodelle Gross, MD, PhD

## 2019-09-17 NOTE — Progress Notes (Signed)
  Radiation Oncology         [phone]) [phone] ________________________________  Name: Holly [name] MRN: [id number]  Date: 07/17/2019  DOB: 10-Jan-1946  RESPIRATORY MOTION MANAGEMENT SIMULATION  NARRATIVE:  In order to account for effect of respiratory motion on target structures and other organs in the planning and delivery of radiotherapy, this patient underwent respiratory motion management simulation.  To accomplish this, when the patient was brought to the CT simulation planning suite, 4D respiratoy motion management CT images were obtained.  The CT images were loaded into the planning software.  Then, using a variety of tools including Cine, MIP, and standard views, the target volume and planning target volumes (PTV) were delineated.  Avoidance structures were contoured.  Treatment planning then occurred.  Dose volume histograms were generated and reviewed for each of the requested structure.  The resulting plan was carefully reviewed and approved today.   ------------------------------------------------  Jodelle Gross, MD, PhD

## 2019-09-25 ENCOUNTER — Encounter: Payer: Self-pay | Admitting: *Deleted

## 2019-09-25 DIAGNOSIS — R5383 Other fatigue: Secondary | ICD-10-CM | POA: Diagnosis not present

## 2019-09-25 DIAGNOSIS — J9611 Chronic respiratory failure with hypoxia: Secondary | ICD-10-CM | POA: Diagnosis not present

## 2019-09-25 DIAGNOSIS — R918 Other nonspecific abnormal finding of lung field: Secondary | ICD-10-CM | POA: Diagnosis not present

## 2019-09-25 DIAGNOSIS — F411 Generalized anxiety disorder: Secondary | ICD-10-CM | POA: Diagnosis not present

## 2019-09-25 DIAGNOSIS — J449 Chronic obstructive pulmonary disease, unspecified: Secondary | ICD-10-CM | POA: Diagnosis not present

## 2019-11-11 DIAGNOSIS — I10 Essential (primary) hypertension: Secondary | ICD-10-CM | POA: Diagnosis not present

## 2019-11-11 DIAGNOSIS — I129 Hypertensive chronic kidney disease with stage 1 through stage 4 chronic kidney disease, or unspecified chronic kidney disease: Secondary | ICD-10-CM | POA: Diagnosis not present

## 2019-11-11 DIAGNOSIS — J449 Chronic obstructive pulmonary disease, unspecified: Secondary | ICD-10-CM | POA: Diagnosis not present

## 2019-11-11 DIAGNOSIS — E785 Hyperlipidemia, unspecified: Secondary | ICD-10-CM | POA: Diagnosis not present

## 2019-11-11 DIAGNOSIS — F325 Major depressive disorder, single episode, in full remission: Secondary | ICD-10-CM | POA: Diagnosis not present

## 2019-11-11 DIAGNOSIS — E890 Postprocedural hypothyroidism: Secondary | ICD-10-CM | POA: Diagnosis not present

## 2019-11-13 ENCOUNTER — Other Ambulatory Visit: Payer: Self-pay

## 2019-11-13 ENCOUNTER — Other Ambulatory Visit (HOSPITAL_COMMUNITY): Payer: Self-pay | Admitting: Family Medicine

## 2019-11-13 ENCOUNTER — Ambulatory Visit (HOSPITAL_COMMUNITY)
Admission: RE | Admit: 2019-11-13 | Discharge: 2019-11-13 | Disposition: A | Discharge status: Home / Self Care | Payer: Medicare Other | Source: Ambulatory Visit | Attending: Family Medicine | Admitting: Family Medicine

## 2019-11-13 DIAGNOSIS — Z7189 Other specified counseling: Secondary | ICD-10-CM | POA: Diagnosis not present

## 2019-11-13 DIAGNOSIS — J9611 Chronic respiratory failure with hypoxia: Secondary | ICD-10-CM | POA: Diagnosis not present

## 2019-11-13 DIAGNOSIS — E785 Hyperlipidemia, unspecified: Secondary | ICD-10-CM | POA: Diagnosis not present

## 2019-11-13 DIAGNOSIS — I129 Hypertensive chronic kidney disease with stage 1 through stage 4 chronic kidney disease, or unspecified chronic kidney disease: Secondary | ICD-10-CM | POA: Diagnosis not present

## 2019-11-13 DIAGNOSIS — N183 Chronic kidney disease, stage 3 unspecified: Secondary | ICD-10-CM | POA: Diagnosis not present

## 2019-11-13 DIAGNOSIS — J449 Chronic obstructive pulmonary disease, unspecified: Secondary | ICD-10-CM | POA: Diagnosis not present

## 2019-11-13 DIAGNOSIS — M7989 Other specified soft tissue disorders: Secondary | ICD-10-CM | POA: Diagnosis not present

## 2019-11-13 DIAGNOSIS — R609 Edema, unspecified: Secondary | ICD-10-CM | POA: Diagnosis not present

## 2019-11-13 DIAGNOSIS — F419 Anxiety disorder, unspecified: Secondary | ICD-10-CM | POA: Diagnosis not present

## 2019-11-13 DIAGNOSIS — E890 Postprocedural hypothyroidism: Secondary | ICD-10-CM | POA: Diagnosis not present

## 2019-11-13 DIAGNOSIS — F325 Major depressive disorder, single episode, in full remission: Secondary | ICD-10-CM | POA: Diagnosis not present

## 2019-11-13 NOTE — Progress Notes (Signed)
Left lower extremity venous duplex study completed.  Preliminary results can be found under CV proc under chart review.  11/13/2019 4:48 PM  Holly [name], K., RDMS, RVT

## 2019-12-11 DIAGNOSIS — F325 Major depressive disorder, single episode, in full remission: Secondary | ICD-10-CM | POA: Diagnosis not present

## 2019-12-11 DIAGNOSIS — J449 Chronic obstructive pulmonary disease, unspecified: Secondary | ICD-10-CM | POA: Diagnosis not present

## 2019-12-11 DIAGNOSIS — I129 Hypertensive chronic kidney disease with stage 1 through stage 4 chronic kidney disease, or unspecified chronic kidney disease: Secondary | ICD-10-CM | POA: Diagnosis not present

## 2019-12-11 DIAGNOSIS — E890 Postprocedural hypothyroidism: Secondary | ICD-10-CM | POA: Diagnosis not present

## 2019-12-11 DIAGNOSIS — I10 Essential (primary) hypertension: Secondary | ICD-10-CM | POA: Diagnosis not present

## 2019-12-11 DIAGNOSIS — E785 Hyperlipidemia, unspecified: Secondary | ICD-10-CM | POA: Diagnosis not present

## 2019-12-27 DIAGNOSIS — J449 Chronic obstructive pulmonary disease, unspecified: Secondary | ICD-10-CM | POA: Diagnosis not present

## 2019-12-27 DIAGNOSIS — R918 Other nonspecific abnormal finding of lung field: Secondary | ICD-10-CM | POA: Diagnosis not present

## 2019-12-27 DIAGNOSIS — R5383 Other fatigue: Secondary | ICD-10-CM | POA: Diagnosis not present

## 2019-12-27 DIAGNOSIS — J9611 Chronic respiratory failure with hypoxia: Secondary | ICD-10-CM | POA: Diagnosis not present

## 2019-12-27 DIAGNOSIS — F411 Generalized anxiety disorder: Secondary | ICD-10-CM | POA: Diagnosis not present

## 2020-01-02 ENCOUNTER — Telehealth: Payer: Self-pay | Admitting: *Deleted

## 2020-01-02 NOTE — Telephone Encounter (Signed)
CALLED PATIENT TO ASK ABOUT COMING IN FOR LABS ON 01-16-20 @ 11:15 AM , SPOKE WITH PATIENT AND SHE AGREED TO THIS APPT.

## 2020-01-13 ENCOUNTER — Telehealth: Payer: Self-pay | Admitting: *Deleted

## 2020-01-13 NOTE — Telephone Encounter (Signed)
Returned patient's phone call, spoke with patient 

## 2020-01-16 ENCOUNTER — Ambulatory Visit: Payer: Medicare Other

## 2020-01-16 ENCOUNTER — Inpatient Hospital Stay (HOSPITAL_COMMUNITY): Admission: RE | Admit: 2020-01-16 | Payer: Medicare Other | Source: Ambulatory Visit

## 2020-01-20 ENCOUNTER — Ambulatory Visit: Payer: Self-pay | Admitting: Radiation Oncology

## 2020-01-28 ENCOUNTER — Ambulatory Visit (HOSPITAL_COMMUNITY): Payer: Medicare Other

## 2020-01-28 ENCOUNTER — Ambulatory Visit: Payer: Medicare Other

## 2020-01-28 ENCOUNTER — Telehealth: Payer: Self-pay | Admitting: *Deleted

## 2020-01-28 DIAGNOSIS — Z79899 Other long term (current) drug therapy: Secondary | ICD-10-CM | POA: Diagnosis not present

## 2020-01-28 DIAGNOSIS — R5383 Other fatigue: Secondary | ICD-10-CM | POA: Diagnosis not present

## 2020-01-28 DIAGNOSIS — J9611 Chronic respiratory failure with hypoxia: Secondary | ICD-10-CM | POA: Diagnosis not present

## 2020-01-28 DIAGNOSIS — G4733 Obstructive sleep apnea (adult) (pediatric): Secondary | ICD-10-CM | POA: Diagnosis not present

## 2020-01-28 DIAGNOSIS — J449 Chronic obstructive pulmonary disease, unspecified: Secondary | ICD-10-CM | POA: Diagnosis not present

## 2020-01-28 DIAGNOSIS — R918 Other nonspecific abnormal finding of lung field: Secondary | ICD-10-CM | POA: Diagnosis not present

## 2020-01-28 DIAGNOSIS — F411 Generalized anxiety disorder: Secondary | ICD-10-CM | POA: Diagnosis not present

## 2020-01-28 NOTE — Telephone Encounter (Signed)
CALLED PATIENT TO INFORM OF STAT LABS ON 02-04-20 @ [facility], SPOKE WITH PATIENT AND SHE IS AWARE OF THIS APPT.

## 2020-02-03 ENCOUNTER — Ambulatory Visit: Payer: Medicare Other

## 2020-02-03 ENCOUNTER — Ambulatory Visit: Payer: Medicare Other | Admitting: Radiation Oncology

## 2020-02-04 ENCOUNTER — Other Ambulatory Visit: Payer: Self-pay

## 2020-02-04 ENCOUNTER — Ambulatory Visit (HOSPITAL_COMMUNITY)
Admission: RE | Admit: 2020-02-04 | Discharge: 2020-02-04 | Disposition: A | Discharge status: Home / Self Care | Payer: Medicare Other | Source: Ambulatory Visit | Attending: Radiation Oncology | Admitting: Radiation Oncology

## 2020-02-04 ENCOUNTER — Ambulatory Visit
Admission: RE | Admit: 2020-02-04 | Discharge: 2020-02-04 | Disposition: A | Discharge status: Home / Self Care | Payer: Medicare Other | Source: Ambulatory Visit | Attending: Radiation Oncology | Admitting: Radiation Oncology

## 2020-02-04 DIAGNOSIS — G4700 Insomnia, unspecified: Secondary | ICD-10-CM | POA: Diagnosis not present

## 2020-02-04 DIAGNOSIS — C3412 Malignant neoplasm of upper lobe, left bronchus or lung: Secondary | ICD-10-CM

## 2020-02-04 DIAGNOSIS — E890 Postprocedural hypothyroidism: Secondary | ICD-10-CM | POA: Diagnosis not present

## 2020-02-04 DIAGNOSIS — E785 Hyperlipidemia, unspecified: Secondary | ICD-10-CM | POA: Diagnosis not present

## 2020-02-04 DIAGNOSIS — Z923 Personal history of irradiation: Secondary | ICD-10-CM | POA: Insufficient documentation

## 2020-02-04 DIAGNOSIS — J449 Chronic obstructive pulmonary disease, unspecified: Secondary | ICD-10-CM | POA: Diagnosis not present

## 2020-02-04 DIAGNOSIS — N183 Chronic kidney disease, stage 3 unspecified: Secondary | ICD-10-CM | POA: Diagnosis not present

## 2020-02-04 DIAGNOSIS — I10 Essential (primary) hypertension: Secondary | ICD-10-CM | POA: Diagnosis not present

## 2020-02-04 DIAGNOSIS — I129 Hypertensive chronic kidney disease with stage 1 through stage 4 chronic kidney disease, or unspecified chronic kidney disease: Secondary | ICD-10-CM | POA: Diagnosis not present

## 2020-02-04 DIAGNOSIS — F325 Major depressive disorder, single episode, in full remission: Secondary | ICD-10-CM | POA: Diagnosis not present

## 2020-02-04 DIAGNOSIS — C3490 Malignant neoplasm of unspecified part of unspecified bronchus or lung: Secondary | ICD-10-CM | POA: Diagnosis not present

## 2020-02-04 LAB — BUN & CREATININE (CHCC)
BUN: 20 mg/dL (ref 8–23)
Creatinine: 1.07 mg/dL — ABNORMAL HIGH (ref 0.44–1.00)
GFR, Est AFR Am: 59 mL/min — ABNORMAL LOW (ref >60)
GFR, Estimated: 51 mL/min — ABNORMAL LOW (ref >60)

## 2020-02-04 IMAGING — CT CT CHEST W/ CM
2 of 4 series · 15 of 36 positions shown, 18 images · IV contrast (OMNIPAQUE)
Comparison: CT chest [DATE] in CT AP [DATE]

CLINICAL DATA: Non-small cell lung cancer. Status BASTIAN2 left
upper lobe. Following right middle lobe lung nodule.

EXAM:
CT CHEST WITH CONTRAST
TECHNIQUE: Multidetector CT imaging of the chest was performed during
intravenous contrast administration.
CONTRAST:  75mL OMNIPAQUE IOHEXOL 300 MG/ML  SOLN

[Series 2: axial st · axial · 0.72mm/px · z∈[+1171,+1435]mm · 12 of 154 slices shown, 15 images]
[im 11/154  mediastinal]
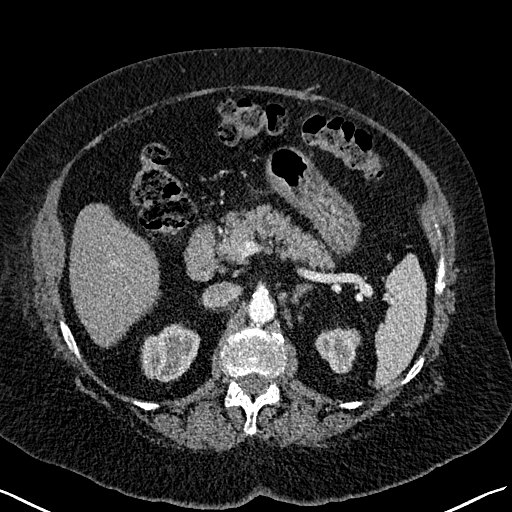
[im 11/154  lung]
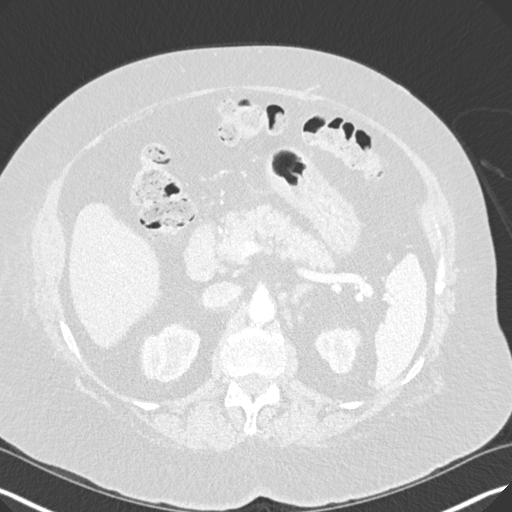
[im 22/154  lung]
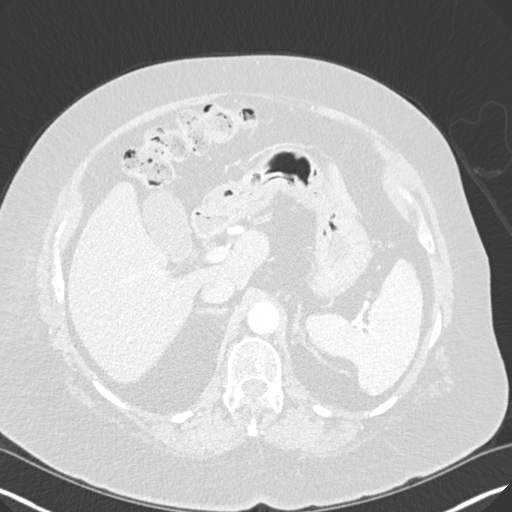
[im 33/154  lung]
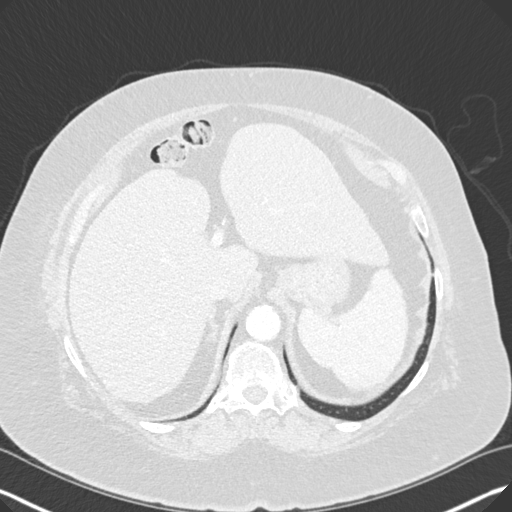
[im 44/154  lung]
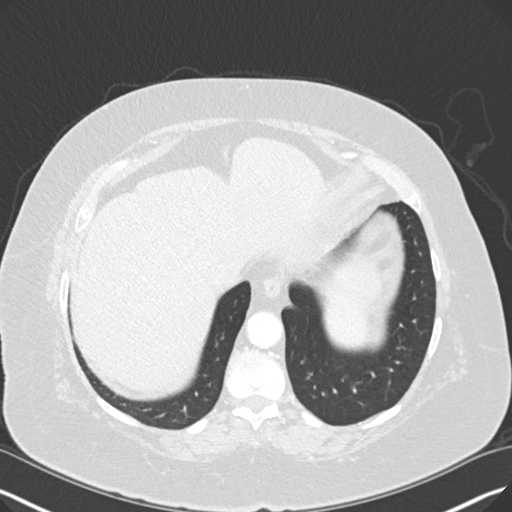
[im 55/154  mediastinal]
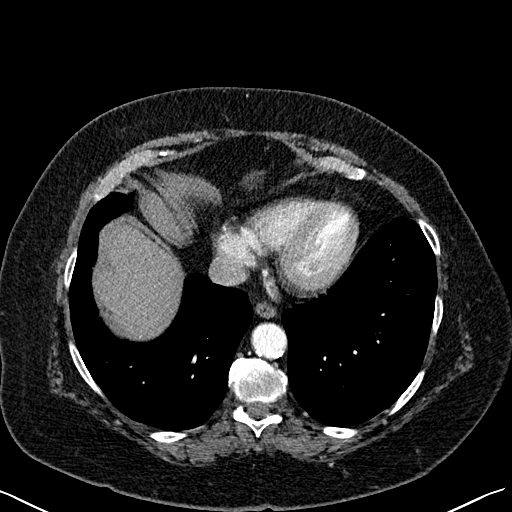
[im 55/154  lung]
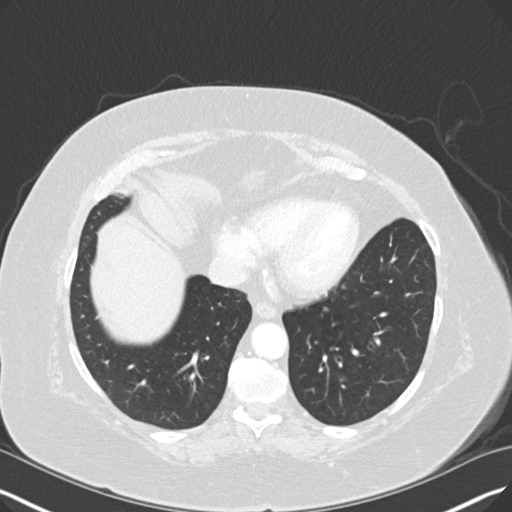
[im 66/154  lung]
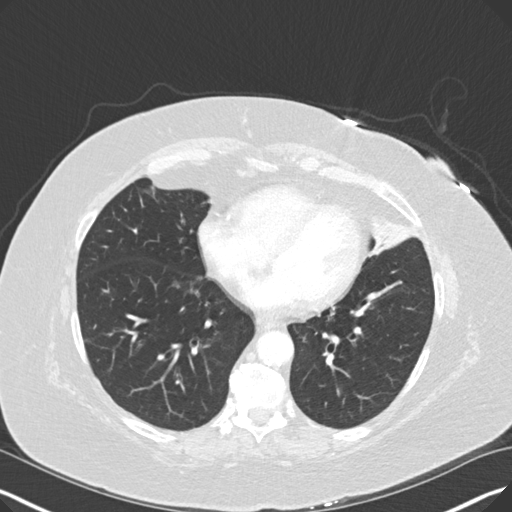
[im 88/154  lung]
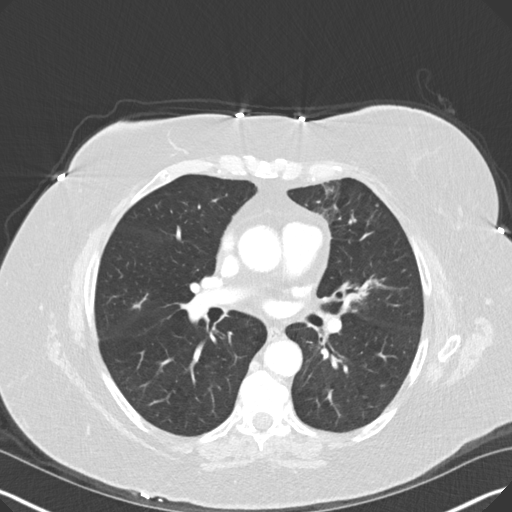
[im 99/154  lung]
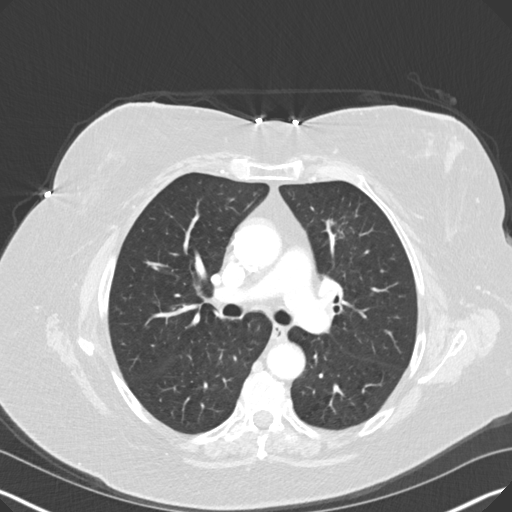
[im 110/154  mediastinal]
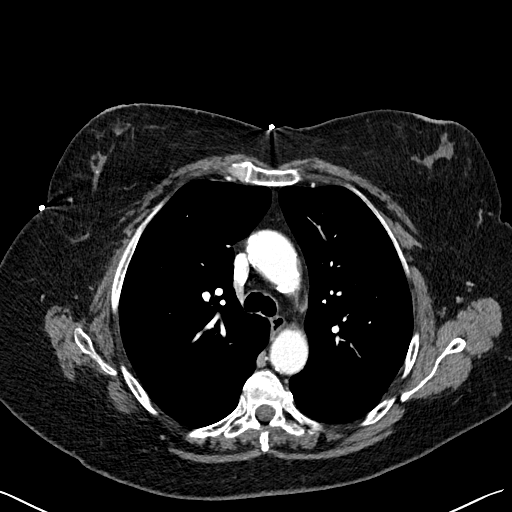
[im 110/154  lung]
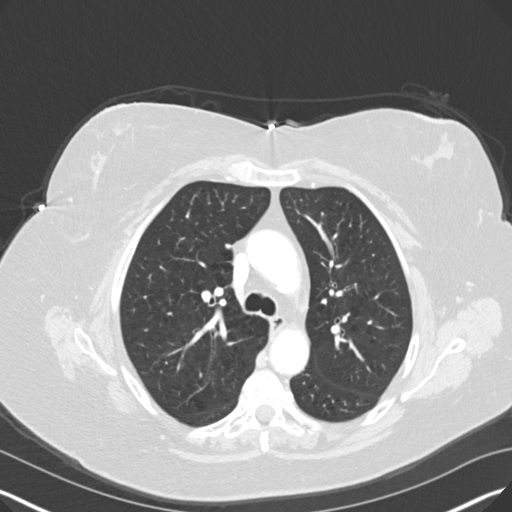
[im 121/154  lung]
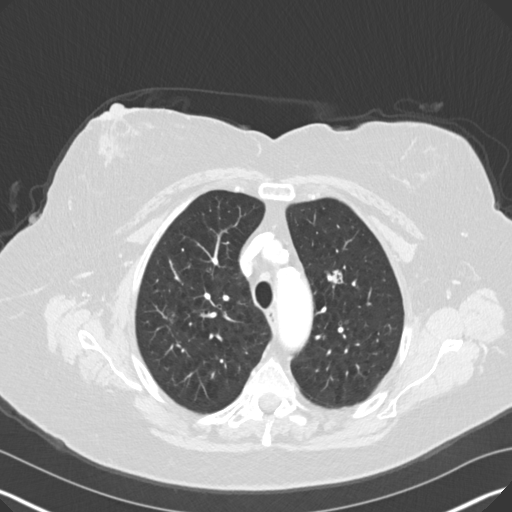
[im 132/154  lung]
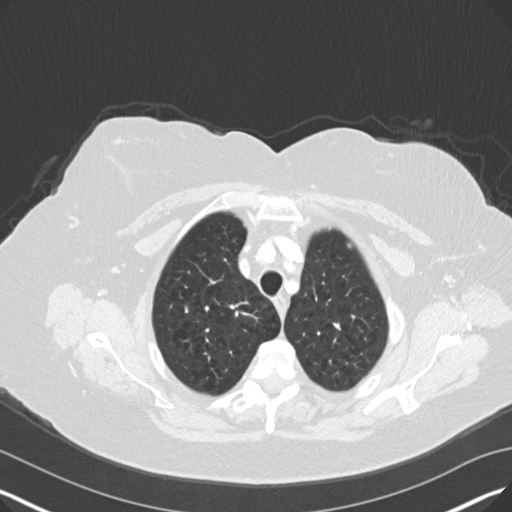
[im 143/154  lung]
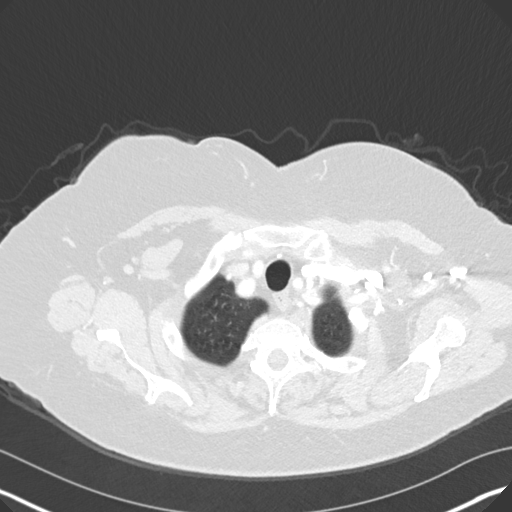

[Series 5: coronal · coronal · 0.70mm/px · 3 of 139 slices shown]
[im 28/139  lung]
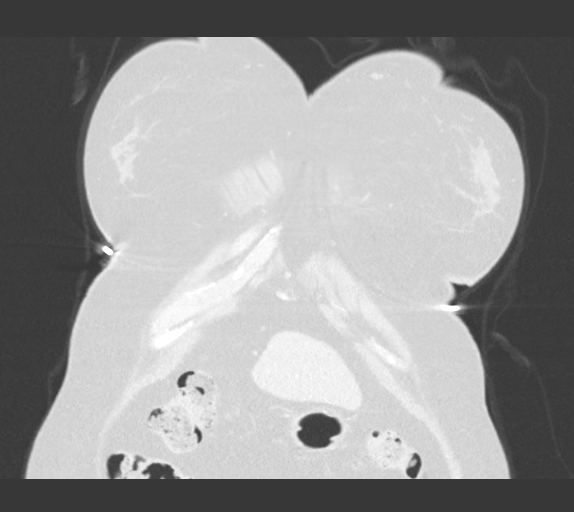
[im 56/139  lung]
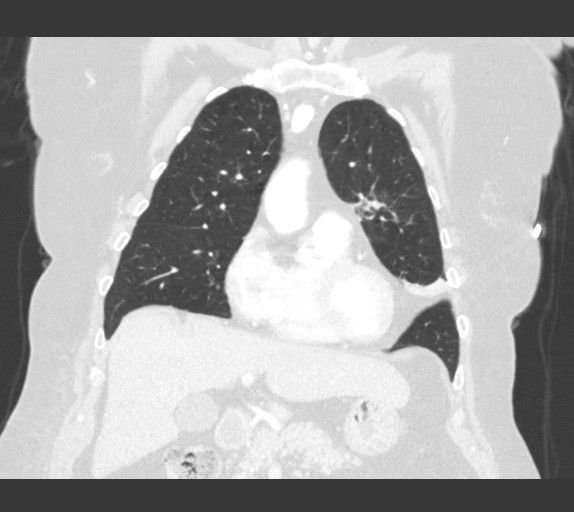
[im 83/139  lung]
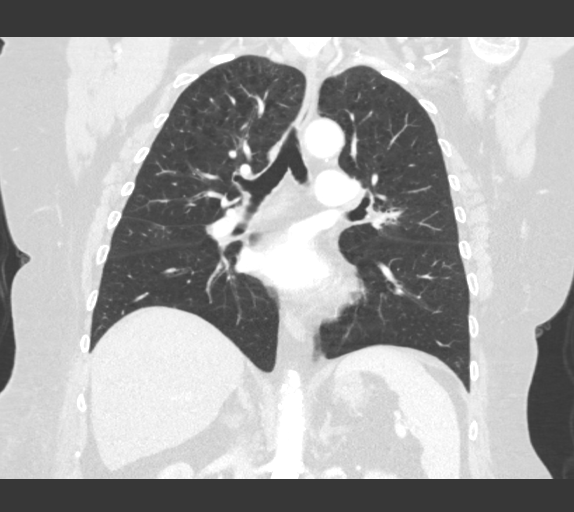

[15 of 36 positions shown; findings below may reference images not displayed]

FINDINGS: Cardiovascular: The heart size appears within normal limits. Aortic
atherosclerosis. Three vessel coronary artery calcifications. No
pericardial effusion.

Mediastinum/Nodes: No enlarged mediastinal, hilar, or axillary lymph
nodes. Trachea appears patent and midline. The thyroid gland is
surgically absent. Normal appearance of the esophagus.

Lungs/Pleura: Moderate changes of centrilobular emphysema. Bandlike
area of fibrosis and architectural distortion identified within the
anteromedial lingula. Radiation changes obscure the previously
described lung nodule within the anteromedial lingula, image 59/7.

Left upper lobe lung nodule is again seen. This has a maximum
dimension of 9 mm, image 32/7. Previously 7 mm. The posterior right
middle lobe lung nodule is stable measuring 1 cm, image 67/7.

Upper Abdomen: No acute abnormality. There is a nodule in the left
adrenal gland which measures 1.3 cm and is unchanged from the CT AP
dated [DATE].

Musculoskeletal: No chest wall abnormality. No acute or significant
osseous findings.
IMPRESSION: 1. The treated lesion within the anteromedial lingula is obscured by
progressive changes of external beam radiation.
2. Separate nodule within the left upper lobe is slightly increased
in size. Currently this measures 9 mm versus 7 mm previously.
3. Stable right middle lobe lung nodule measuring 1 cm
4. Aortic Atherosclerosis ([2U]-[2U]) and Emphysema ([2U]-[2U]).
5. Coronary artery calcifications.
6. Stable left adrenal nodule, favor benign adenoma.

## 2020-02-04 MED ORDER — SODIUM CHLORIDE (PF) 0.9 % IJ SOLN
INTRAMUSCULAR | Status: AC
  Filled 2020-02-04: qty 50

## 2020-02-04 MED ORDER — IOHEXOL 300 MG/ML  SOLN
75.0000 mL | Freq: Once | INTRAMUSCULAR | Status: AC | PRN
  Administered 2020-02-04: 75 mL via INTRAVENOUS

## 2020-02-06 ENCOUNTER — Other Ambulatory Visit: Payer: Self-pay

## 2020-02-06 ENCOUNTER — Ambulatory Visit
Admission: RE | Admit: 2020-02-06 | Discharge: 2020-02-06 | Disposition: A | Discharge status: Home / Self Care | Payer: Medicare Other | Source: Ambulatory Visit | Attending: Radiation Oncology | Admitting: Radiation Oncology

## 2020-02-06 DIAGNOSIS — J449 Chronic obstructive pulmonary disease, unspecified: Secondary | ICD-10-CM | POA: Diagnosis not present

## 2020-02-06 DIAGNOSIS — R918 Other nonspecific abnormal finding of lung field: Secondary | ICD-10-CM | POA: Diagnosis not present

## 2020-02-06 DIAGNOSIS — C3412 Malignant neoplasm of upper lobe, left bronchus or lung: Secondary | ICD-10-CM

## 2020-02-06 DIAGNOSIS — Z9981 Dependence on supplemental oxygen: Secondary | ICD-10-CM | POA: Diagnosis not present

## 2020-02-06 DIAGNOSIS — Z08 Encounter for follow-up examination after completed treatment for malignant neoplasm: Secondary | ICD-10-CM | POA: Diagnosis not present

## 2020-02-10 ENCOUNTER — Other Ambulatory Visit: Payer: Self-pay | Admitting: Radiation Oncology

## 2020-02-10 ENCOUNTER — Ambulatory Visit: Payer: Medicare Other | Admitting: Radiation Oncology

## 2020-02-10 DIAGNOSIS — C3412 Malignant neoplasm of upper lobe, left bronchus or lung: Secondary | ICD-10-CM

## 2020-02-10 NOTE — Addendum Note (Signed)
Encounter addended by: Hayden Pedro, PA-C on: 02/10/2020 8:51 AM  Actions taken: Follow-up modified

## 2020-02-10 NOTE — Progress Notes (Signed)
Radiation Oncology         [phone] ________________________________  Outpatient Follow Up - Conducted via telephone due to current COVID-19 concerns for limiting patient exposure  I spoke with the patient to conduct this consult visit via telephone to spare the patient unnecessary potential exposure in the healthcare setting during the current COVID-19 pandemic. The patient was notified in advance and was offered a [place] meeting to allow for face to face communication but unfortunately reported that they did not have the appropriate resources/technology to support such a visit and instead preferred to proceed with a telephone visit.    ________________________________  Name: [name] MRN: [id number]  Date of Service: 02/06/2020  DOB: 23-Dec-1945  Diagnosis:    Stage IA2, cT1bN0M0, NSCLC, squamous cell carcinoma of the LUL.  Interval Since Last Radiation:  6 months  07/30/2019-08/06/2019 SBRT Treatment: The LUL target was treated to 54 Gy in 3 fractions.    Narrative:  Holly [name] is a pleasant 74 y.o. female originally seen at the request of Dr. Kipp Brood with a history of COPD, oxygen dependant at 2L [place] for the last two years. She was previously a patient of Dr. Lake Bells, but following pneumonia that required admission at [facility] about 2 years ago when she started needing O2, she continued with Dr. Alcide Clever. She was recently found on CT imaging in July 2020 to have a 12 x 8 mm left upper lobe nodule and a nodule in the RML measuring 13 x 7 mm. She underwent a PET scan on 06/05/2019 revealed hypermetabolic change with an SUV of 8.5 in the LUL and this measured 12 mm, and in the RML the nodule was 13 mm, and had an SUV of 1.4. She did not have any hypermetabolic adenopathy. A left adrenal adenoma was noted as well and was not hypermetabolic. She underwent navigational bronchoscopy on 06/25/2019 with Dr. Kipp Brood and a biopsy and brushing of the LUL revealed squamous cell  carcinoma. The RML lesion was also sampled, and atypical cells were noted but not diagnostic of cancer. She was discussed in [facility] conference and she did not appear to be a good candidate for surgical resection, and rather that she would benefit from SBRT to the LUL and probably follow the RML with serial imaging prior to intervening with treatment.  She did proceed with stereotactic body radiotherapy which she completed on 08/06/2019 to the left upper lobe.  Her posttreatment imaging has shown stability in the treated site, along with stability in the RML nodule as well. Her most recent CT on 02/04/20 revealed stability again in the LUL and RML nodule, but did identify a change in a separate LUL nodule now measuring 9 mm, 7 mm previously. She's contacted to review these results.   On review of systems, the patient reports that she is doing well overall. She denies any chest pain, shortness of breath, cough, fevers, chills, night sweats, unintended weight changes. She denies any bowel or bladder disturbances, and denies abdominal pain, nausea or vomiting. She denies any new musculoskeletal or joint aches or pains, new skin lesions or concerns. A complete review of systems is obtained and is otherwise negative.   Past Medical History:  Past Medical History:  Diagnosis Date  . Anxiety   . Asthma   . Basal cell carcinoma   . Bruises easily   . Cataract   . Cervicalgia   . 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  Dyspnea    uses O2 only when needed. Uses 2 L   . Frequency of urination   . Headache   . 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 Hypercalcemia   . Hypertension   . Hypothyroidism   . Mixed hyperlipidemia   . Pneumonia   . Pulmonary nodule   . Sleep apnea    does not wear cpap  . Thyroid neoplasm   . Urinary incontinence   . Wears dentures   . Wears glasses     Past Surgical History: Past Surgical History:  Procedure Laterality Date    . COLONOSCOPY    . DENTAL SURGERY    . ELBOW ARTHROPLASTY Right 2020  . EYE SURGERY Right    cataract surgery  . FOOT SURGERY  30 YRS AGO   BILATERAL  . ORIF ELBOW FRACTURE Right 12/06/2017   Procedure: OPEN REDUCTION INTERNAL FIXATION (ORIF) ELBOW/OLECRANON FRACTURE;  Surgeon: Shona Needles, MD;  Location: [facility];  Service: Orthopedics;  Laterality: Right;  . SKIN CANCER EXCISION    . THYROIDECTOMY N/A 07/31/2014   Procedure: TOTAL THYROIDECTOMY;  Surgeon: Armandina Gemma, MD;  Location: [facility];  Service: General;  Laterality: N/A;  . VAGINAL HYSTERECTOMY    . VIDEO BRONCHOSCOPY WITH ENDOBRONCHIAL NAVIGATION Bilateral 06/25/2019   Procedure: VIDEO BRONCHOSCOPY WITH ENDOBRONCHIAL NAVIGATION;  Surgeon: Lajuana Matte, MD;  Location: [facility];  Service: Thoracic;  Laterality: Bilateral;    Social History:  Social History   Socioeconomic History  . Marital status: Widowed    Spouse name: Not on file  . Number of children: 2  . Years of education: Not on file  . Highest education level: Not on file  Occupational History  . Occupation: [employer]  Tobacco Use  . Smoking status: Former Smoker    Packs/day: 1.00    Years: 30.00    Pack years: 30.00    Types: Cigarettes    Quit date: 02/24/2015    Years since quitting: 4.9  . Smokeless tobacco: Never Used  Substance and Sexual Activity  . Alcohol use: Yes    Alcohol/week: 0.0 standard drinks    Comment: very seldom  . Drug use: Never  . Sexual activity: Not on file  Other Topics Concern  . Not on file  Social History Narrative  . Not on file   Social Determinants of Health   Financial Resource Strain:   . Difficulty of Paying Living Expenses:   Food Insecurity:   . Worried About Charity fundraiser in the Last Year:   . Arboriculturist in the Last Year:   Transportation Needs: No Transportation Needs  . Lack of Transportation (Medical): No  . Lack of Transportation (Non-Medical): No  Physical Activity:   . Days  of Exercise per Week:   . Minutes of Exercise per Session:   Stress:   . Feeling of Stress :   Social Connections:   . Frequency of Communication with Friends and Family:   . Frequency of Social Gatherings with Friends and Family:   . Attends Religious Services:   . Active Member of Clubs or Organizations:   . Attends Archivist Meetings:   Marland Kitchen Marital Status:   Intimate Partner Violence:   . Fear of Current or Ex-Partner:   . Emotionally Abused:   Marland Kitchen Physically Abused:   . Sexually Abused:   The patient is widowed and lives in [place] in [place].  Family  History: Family History  Problem Relation Age of Onset  . Coronary artery disease Father   . Hypertension Father   . Asthma Father   . Allergies Father   . Hypertension Mother   . Lung cancer Mother      Medications: Current Outpatient Medications  Medication Sig Dispense Refill  . ALPRAZolam (XANAX) 0.25 MG tablet Take 0.25 mg by mouth 2 (two) times daily as needed for anxiety.     Marland Kitchen aspirin 81 MG tablet Take 81 mg by mouth daily.    Marland Kitchen atorvastatin (LIPITOR) 20 MG tablet Take 20 mg by mouth at bedtime.     Marland Kitchen buPROPion (WELLBUTRIN XL) 300 MG 24 hr tablet Take 300 mg by mouth daily.    . busPIRone (BUSPAR) 30 MG tablet Take 30 mg by mouth 2 (two) times daily.    . Fluticasone-Umeclidin-Vilant (TRELEGY ELLIPTA) 100-62.5-25 MCG/INH AEPB Inhale 1 puff into the lungs daily.    Marland Kitchen levothyroxine (SYNTHROID) 112 MCG tablet Take 112 mcg by mouth daily before breakfast. Once daily  0  . metoprolol succinate (TOPROL-XL) 50 MG 24 hr tablet Take 50 mg by mouth daily. Take with or immediately following a meal.    . VENTOLIN HFA 108 (90 Base) MCG/ACT inhaler INHALE 2 PUFFS INTO THE LUNGS EVERY 4 HOURS AS NEEDED FOR WHEEZING ORSHORTNESS OF BREATH (Patient taking differently: Inhale 2 puffs into the lungs every 4 (four) hours as needed for wheezing or shortness of breath. ) 8 g 0   No current facility-administered medications  for this encounter.     Allergies:  Allergies  Allergen Reactions  . Codeine Nausea And Vomiting  . Oxycodone-Acetaminophen Nausea And Vomiting   Physical Exam: Unable to assess due to encounter type.  Impression/Plan: 1.         Stage IA2, cT1bN0M0, NSCLC, squamous cell carcinoma of the LUL.  The patient is doing well clinically and the previously treated site is stable and does not appear to be actively changing.  Dr. Lisbeth Renshaw has also reviewed these results and agrees with proceeding with continued surveillance.  The patient is in agreement with this plan, and we discussed that the ultimate goal is to avoid progression and treat any additional nodules that are suspicious.  She is also reminded of the long-term side effects of rib fracture with radiotherapy.   2.         LUL nodule. This lesion has slightly increased by 2 mm in the last 4 month interval. She will have a repeat CT in 3 months time to follow up on this and to make sure there's no change in her RML nodule as well. 3. RML nodule with atypical cells on biopsy but below FDG affinity on PET scan.  Fortunately the right middle lobe nodule continues to remain the main stable.  As above Dr. Lisbeth Renshaw has reviewed the scans, and has recommended continued surveillance.  We will proceed with a repeat CT of the chest in 3 months with the anticipation of moving to q 6 month scans subsequently per NCCN guidelines if stable. 4. Plans for pulmonary follow up.  The patient will continue under the care of  Dr. Alcide Clever in [place].    Given current concerns for patient exposure during the COVID-19 pandemic, this encounter was conducted via telephone.  The patient has given verbal consent for this type of encounter. The time spent during this encounter was 30 minutes and 50% of that time was spent in the coordination of her care.  The attendants for this meeting include Shona Simpson, [facility] and Orphia Jessee Avers  During the encounter, Shona Simpson [facility] was  located at [facility] Radiation Oncology Department.  Shaquasia N Schwanz  was located at home.    Carola Rhine, PAC

## 2020-03-20 ENCOUNTER — Other Ambulatory Visit (HOSPITAL_COMMUNITY): Payer: Self-pay | Admitting: Physician Assistant

## 2020-03-20 ENCOUNTER — Ambulatory Visit (HOSPITAL_COMMUNITY)
Admission: RE | Admit: 2020-03-20 | Discharge: 2020-03-20 | Disposition: A | Discharge status: Home / Self Care | Payer: Medicare Other | Source: Ambulatory Visit | Attending: Physician Assistant | Admitting: Physician Assistant

## 2020-03-20 ENCOUNTER — Other Ambulatory Visit: Payer: Self-pay

## 2020-03-20 DIAGNOSIS — M7989 Other specified soft tissue disorders: Secondary | ICD-10-CM | POA: Insufficient documentation

## 2020-03-20 DIAGNOSIS — M79605 Pain in left leg: Secondary | ICD-10-CM | POA: Insufficient documentation

## 2020-03-20 DIAGNOSIS — R1030 Lower abdominal pain, unspecified: Secondary | ICD-10-CM | POA: Diagnosis not present

## 2020-03-20 NOTE — Progress Notes (Signed)
Lower extremity venous has been completed.   Preliminary results in CV Proc.   Holly [name] 03/20/2020 3:11 PM

## 2020-03-22 DIAGNOSIS — M25552 Pain in left hip: Secondary | ICD-10-CM | POA: Diagnosis not present

## 2020-04-01 ENCOUNTER — Other Ambulatory Visit: Payer: Self-pay | Admitting: Family Medicine

## 2020-04-01 DIAGNOSIS — M1612 Unilateral primary osteoarthritis, left hip: Secondary | ICD-10-CM | POA: Diagnosis not present

## 2020-04-01 DIAGNOSIS — M1611 Unilateral primary osteoarthritis, right hip: Secondary | ICD-10-CM | POA: Diagnosis not present

## 2020-04-01 DIAGNOSIS — M5136 Other intervertebral disc degeneration, lumbar region: Secondary | ICD-10-CM

## 2020-04-01 DIAGNOSIS — R1013 Epigastric pain: Secondary | ICD-10-CM | POA: Diagnosis not present

## 2020-04-01 DIAGNOSIS — E890 Postprocedural hypothyroidism: Secondary | ICD-10-CM | POA: Diagnosis not present

## 2020-04-01 DIAGNOSIS — R1032 Left lower quadrant pain: Secondary | ICD-10-CM | POA: Diagnosis not present

## 2020-04-01 DIAGNOSIS — N183 Chronic kidney disease, stage 3 unspecified: Secondary | ICD-10-CM | POA: Diagnosis not present

## 2020-04-11 DIAGNOSIS — Z7982 Long term (current) use of aspirin: Secondary | ICD-10-CM | POA: Diagnosis not present

## 2020-04-11 DIAGNOSIS — I6529 Occlusion and stenosis of unspecified carotid artery: Secondary | ICD-10-CM | POA: Diagnosis not present

## 2020-04-11 DIAGNOSIS — E7800 Pure hypercholesterolemia, unspecified: Secondary | ICD-10-CM | POA: Diagnosis not present

## 2020-04-11 DIAGNOSIS — Z87891 Personal history of nicotine dependence: Secondary | ICD-10-CM | POA: Diagnosis not present

## 2020-04-11 DIAGNOSIS — I959 Hypotension, unspecified: Secondary | ICD-10-CM | POA: Diagnosis not present

## 2020-04-11 DIAGNOSIS — E785 Hyperlipidemia, unspecified: Secondary | ICD-10-CM | POA: Diagnosis not present

## 2020-04-11 DIAGNOSIS — R220 Localized swelling, mass and lump, head: Secondary | ICD-10-CM | POA: Diagnosis not present

## 2020-04-11 DIAGNOSIS — Z79899 Other long term (current) drug therapy: Secondary | ICD-10-CM | POA: Diagnosis not present

## 2020-04-11 DIAGNOSIS — S0990XA Unspecified injury of head, initial encounter: Secondary | ICD-10-CM | POA: Diagnosis not present

## 2020-04-11 DIAGNOSIS — S0083XA Contusion of other part of head, initial encounter: Secondary | ICD-10-CM | POA: Diagnosis not present

## 2020-04-11 DIAGNOSIS — S199XXA Unspecified injury of neck, initial encounter: Secondary | ICD-10-CM | POA: Diagnosis not present

## 2020-04-11 DIAGNOSIS — R739 Hyperglycemia, unspecified: Secondary | ICD-10-CM | POA: Diagnosis not present

## 2020-04-11 DIAGNOSIS — S80212A Abrasion, left knee, initial encounter: Secondary | ICD-10-CM | POA: Diagnosis not present

## 2020-04-11 DIAGNOSIS — I10 Essential (primary) hypertension: Secondary | ICD-10-CM | POA: Diagnosis not present

## 2020-04-11 DIAGNOSIS — R42 Dizziness and giddiness: Secondary | ICD-10-CM | POA: Diagnosis not present

## 2020-05-05 ENCOUNTER — Ambulatory Visit
Admission: RE | Admit: 2020-05-05 | Discharge: 2020-05-05 | Disposition: A | Discharge status: Home / Self Care | Payer: Medicare Other | Source: Ambulatory Visit | Attending: Family Medicine | Admitting: Family Medicine

## 2020-05-05 ENCOUNTER — Other Ambulatory Visit: Payer: Self-pay | Admitting: Student

## 2020-05-05 DIAGNOSIS — R918 Other nonspecific abnormal finding of lung field: Secondary | ICD-10-CM | POA: Diagnosis not present

## 2020-05-05 DIAGNOSIS — F411 Generalized anxiety disorder: Secondary | ICD-10-CM | POA: Diagnosis not present

## 2020-05-05 DIAGNOSIS — M5136 Other intervertebral disc degeneration, lumbar region: Secondary | ICD-10-CM

## 2020-05-05 DIAGNOSIS — M4319 Spondylolisthesis, multiple sites in spine: Secondary | ICD-10-CM | POA: Diagnosis not present

## 2020-05-05 DIAGNOSIS — J449 Chronic obstructive pulmonary disease, unspecified: Secondary | ICD-10-CM | POA: Diagnosis not present

## 2020-05-05 DIAGNOSIS — M1612 Unilateral primary osteoarthritis, left hip: Secondary | ICD-10-CM | POA: Diagnosis not present

## 2020-05-05 DIAGNOSIS — M25452 Effusion, left hip: Secondary | ICD-10-CM | POA: Diagnosis not present

## 2020-05-05 DIAGNOSIS — Z79899 Other long term (current) drug therapy: Secondary | ICD-10-CM | POA: Diagnosis not present

## 2020-05-05 DIAGNOSIS — M76892 Other specified enthesopathies of left lower limb, excluding foot: Secondary | ICD-10-CM | POA: Diagnosis not present

## 2020-05-05 DIAGNOSIS — M48061 Spinal stenosis, lumbar region without neurogenic claudication: Secondary | ICD-10-CM | POA: Diagnosis not present

## 2020-05-05 DIAGNOSIS — D649 Anemia, unspecified: Secondary | ICD-10-CM | POA: Diagnosis not present

## 2020-05-05 DIAGNOSIS — M4807 Spinal stenosis, lumbosacral region: Secondary | ICD-10-CM | POA: Diagnosis not present

## 2020-05-05 DIAGNOSIS — M5126 Other intervertebral disc displacement, lumbar region: Secondary | ICD-10-CM | POA: Diagnosis not present

## 2020-05-05 DIAGNOSIS — G4733 Obstructive sleep apnea (adult) (pediatric): Secondary | ICD-10-CM | POA: Diagnosis not present

## 2020-05-05 DIAGNOSIS — J9611 Chronic respiratory failure with hypoxia: Secondary | ICD-10-CM | POA: Diagnosis not present

## 2020-05-05 DIAGNOSIS — R5383 Other fatigue: Secondary | ICD-10-CM | POA: Diagnosis not present

## 2020-05-05 IMAGING — MR MR LUMBAR SPINE WO/W CM
7 series · 45 of 48 positions shown · IV contrast (multihance)
Comparison: None.

CLINICAL DATA: Low back pain with left hip pain. History of lung
cancer.

EXAM:
MRI LUMBAR SPINE WITHOUT AND WITH CONTRAST
TECHNIQUE: Multiplanar and multiecho pulse sequences of the lumbar spine were
obtained without and with intravenous contrast.
CONTRAST:  16mL MULTIHANCE GADOBENATE DIMEGLUMINE 529 MG/ML IV SOLN

[Series 3: tirm sag · sagittal · 4.0mm · 0.55mm/px · 5 of 13 slices shown]
[im 1/13]
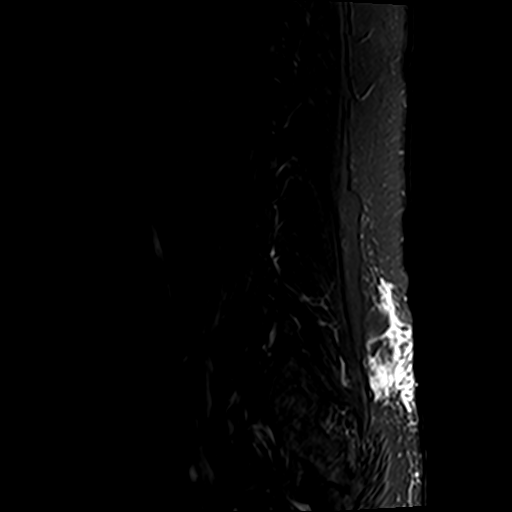
[im 4/13]
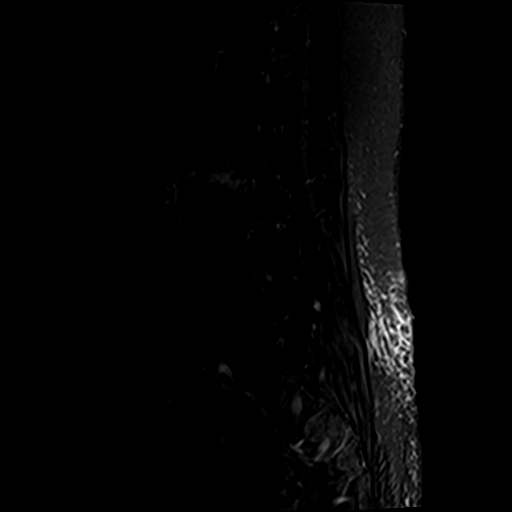
[im 7/13]
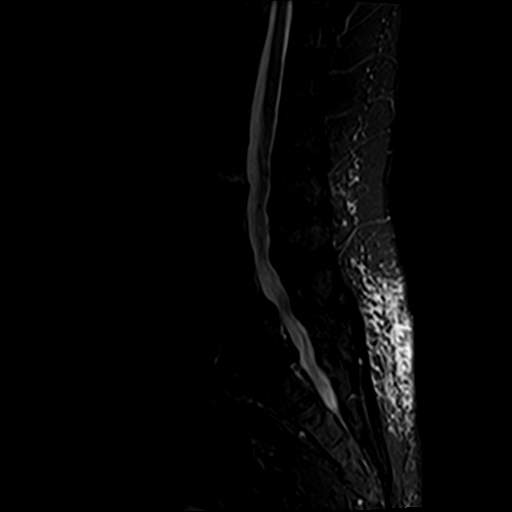
[im 10/13]
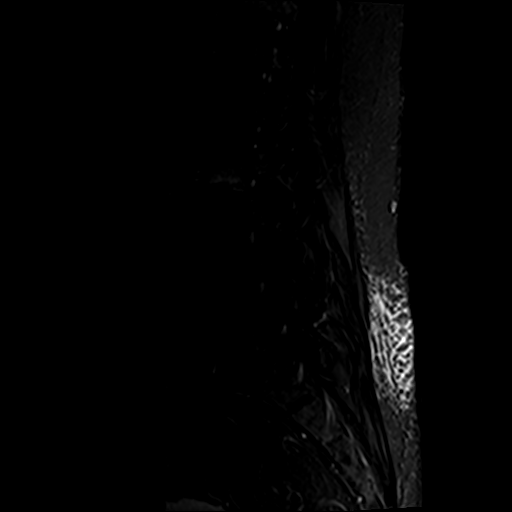
[im 13/13]
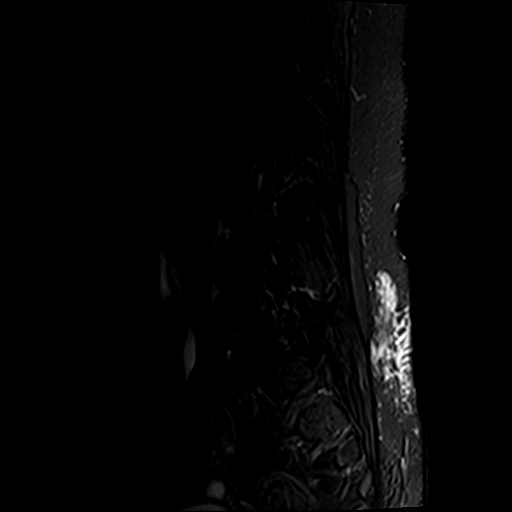

[Series 4: T1 · sagittal · 4.0mm · 0.88mm/px · 5 of 13 slices shown (1 of 2)]
[im 1/13]
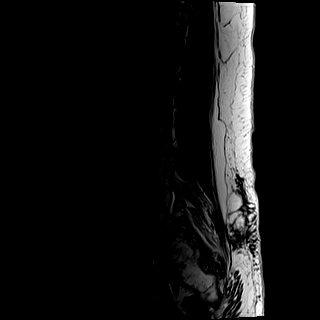
[im 4/13]
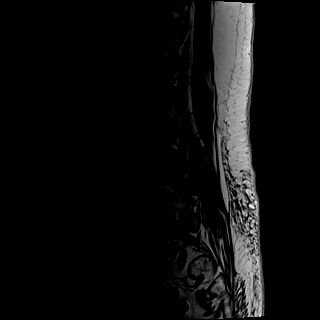
[im 7/13]
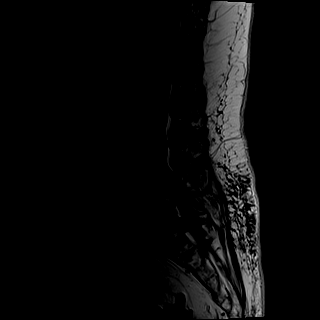
[im 10/13]
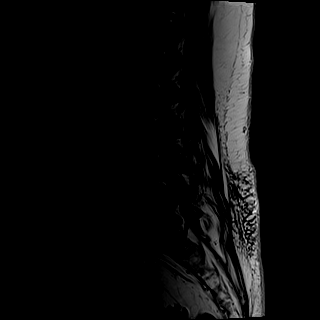
[im 13/13]
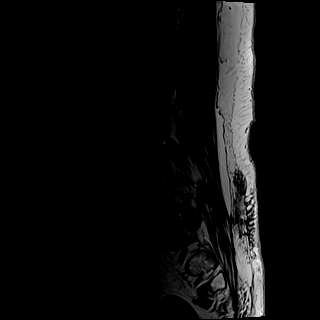

[Series 5: T1 · axial · 4.0mm · 0.78mm/px · z∈[-41,+158]mm · 9 of 32 slices shown (2 of 2)]
[im 1/32]
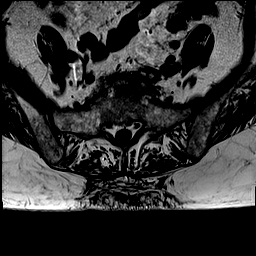
[im 4/32]
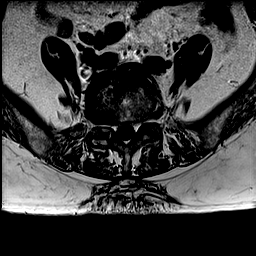
[im 7/32]
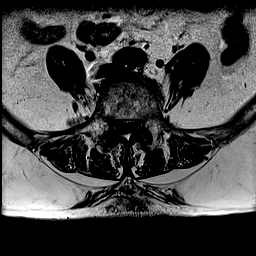
[im 11/32]
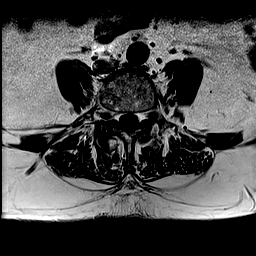
[im 14/32]
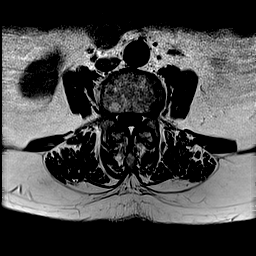
[im 18/32]
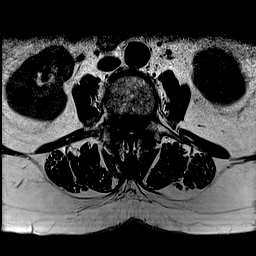
[im 21/32]
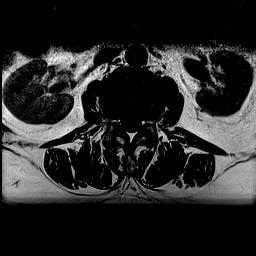
[im 28/32]
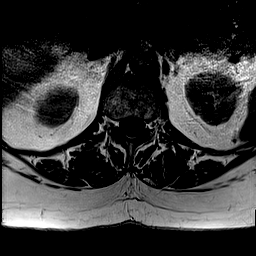
[im 32/32]
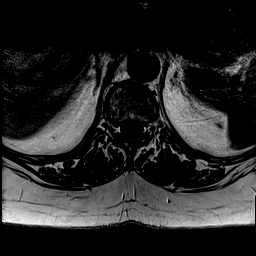

[Series 6: T2 · axial · 4.0mm · 0.78mm/px · z∈[-41,+158]mm · 10 of 32 slices shown]
[im 1/32]
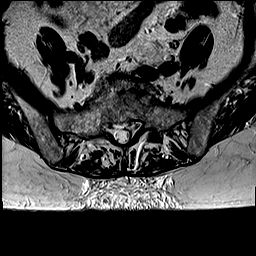
[im 4/32]
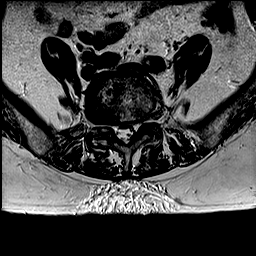
[im 7/32]
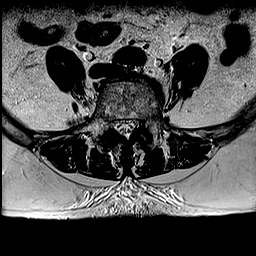
[im 11/32]
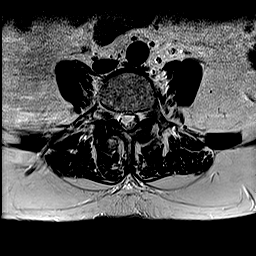
[im 14/32]
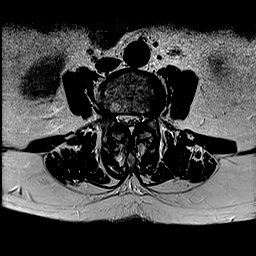
[im 18/32]
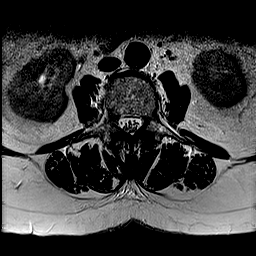
[im 21/32]
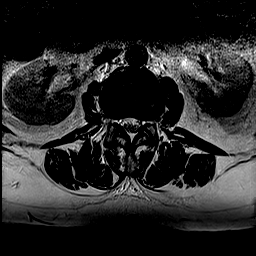
[im 25/32]
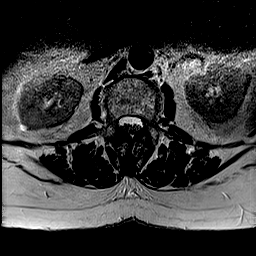
[im 28/32]
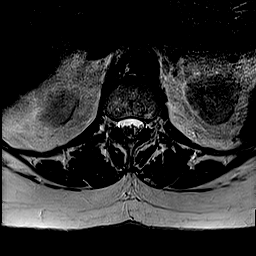
[im 32/32]
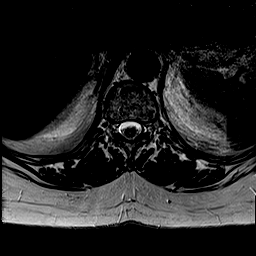

[Series 7: T2 post-contrast · sagittal · 4.0mm · 0.88mm/px · 4 of 13 slices shown]
[im 1/13]
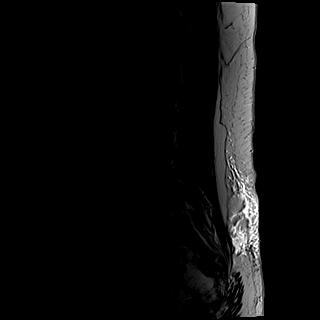
[im 5/13]
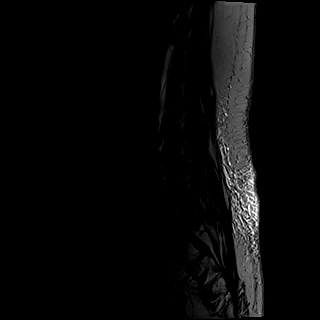
[im 9/13]
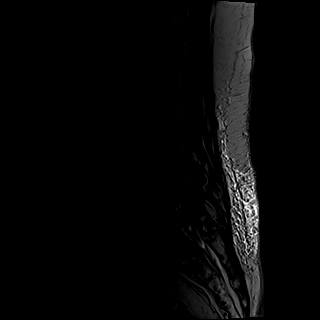
[im 13/13]
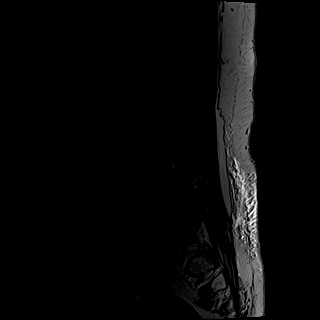

[Series 8: T1 fat-sat post-contrast · sagittal · 4.0mm · 0.88mm/px · 4 of 13 slices shown]
[im 1/13]
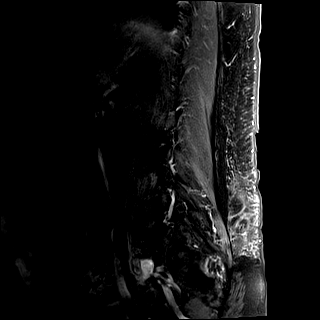
[im 5/13]
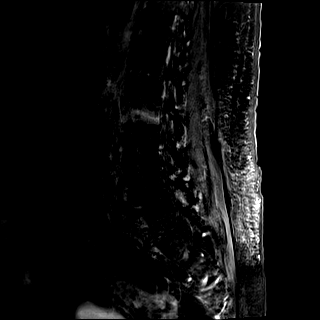
[im 9/13]
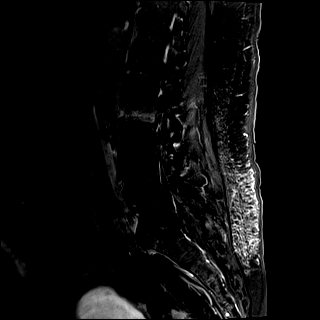
[im 13/13]
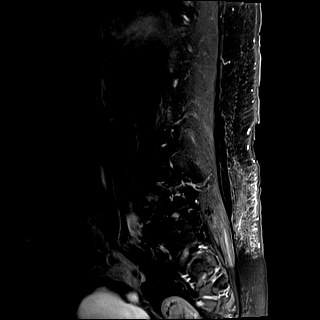

[Series 9: T1 post-contrast · axial · 4.0mm · 0.78mm/px · z∈[-41,+158]mm · 8 of 32 slices shown]
[im 1/32]
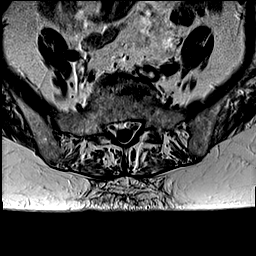
[im 4/32]
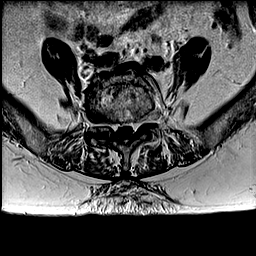
[im 11/32]
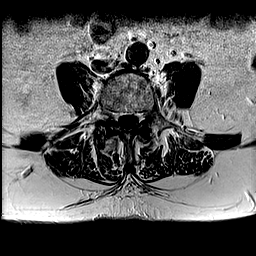
[im 14/32]
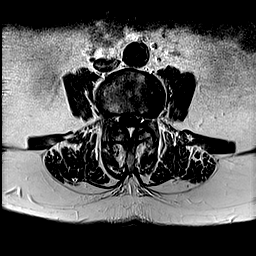
[im 18/32]
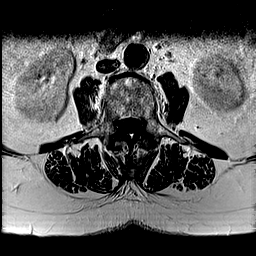
[im 21/32]
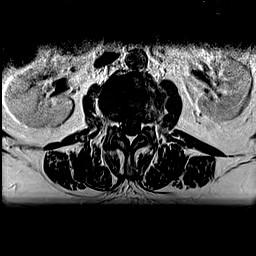
[im 28/32]
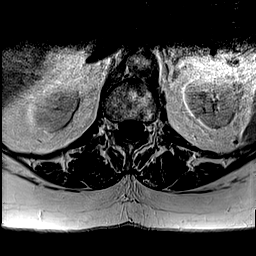
[im 32/32]
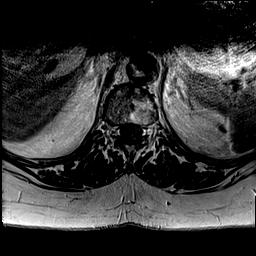

[45 of 48 positions shown; findings below may reference images not displayed]

FINDINGS: Segmentation:  Normal

Alignment:  6 mm anterolisthesis C4-5.  5 mm retrolisthesis L5-S1.

Vertebrae: Mild inferior endplate fracture of L2 which shows bone
marrow edema and enhancement. This appears to be a recent fracture.
No underlying mass lesion. No other fracture or metastatic disease
in the lumbar spine.

Conus medullaris and cauda equina: Conus extends to the L1-2 level.
Conus and cauda equina appear normal.

Paraspinal and other soft tissues: Negative for paraspinous mass or
adenopathy.

Disc levels:

L1-2: Negative

L2-3: Mild disc degeneration and endplate spurring. Mild facet
degeneration. Mild subarticular stenosis left greater than right.

L3-4: Mild disc bulging and endplate spurring. Left foraminal disc
protrusion. Extruded disc fragment extending superiorly with
impingement of the left L3 nerve root. Small disc fragment in the
foramen. Bilateral facet degeneration and mild spinal stenosis.

L4-5: 6 mm anterolisthesis with severe facet degeneration. Diffuse
disc bulging. Moderate spinal stenosis and moderate subarticular
stenosis bilaterally

L5-S1: 5 mm retrolisthesis with disc degeneration and disc bulging.
Superimposed central disc protrusion. Mild facet degeneration.
Moderate subarticular stenosis bilaterally.
IMPRESSION: Subacute fracture inferior endplate of L2.  No underlying tumor

Small left foraminal disc protrusion L3-4 with left L3 nerve root
impingement.

Moderate spinal stenosis and moderate subarticular stenosis
bilaterally L4-5

Moderate subarticular stenosis bilaterally L5-S1.

## 2020-05-05 IMAGING — MR MR HIP*L* WO/W CM
8 of 9 series · 32 of 40 positions shown · IV contrast (multihance)
Comparison: Multiple exams, including PET-CT from [DATE]

CLINICAL DATA: Low back pain radiating into the left hip with
history of lung cancer.

EXAM:
MRI OF THE LEFT HIP WITHOUT AND WITH CONTRAST
TECHNIQUE: Multiplanar, multisequence MR imaging was performed both before and
after administration of intravenous contrast.
CONTRAST:  16mL MULTIHANCE GADOBENATE DIMEGLUMINE 529 MG/ML IV SOLN

[Series 3: T1 · coronal · 4.0mm · 1.19mm/px · 4 of 27 slices shown]
[im 1/27]
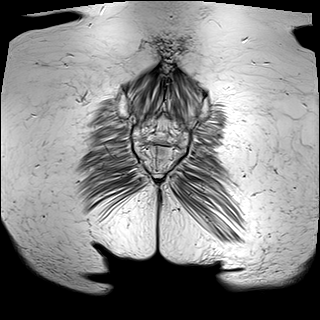
[im 9/27]
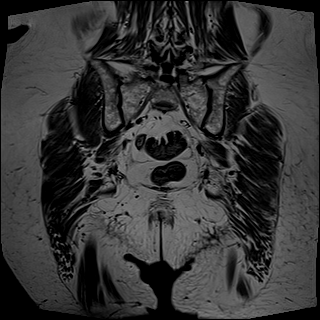
[im 18/27]
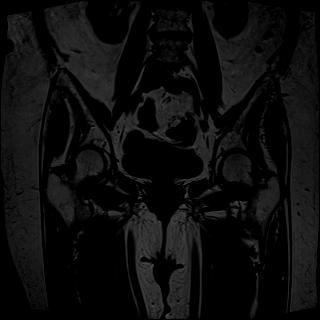
[im 27/27]
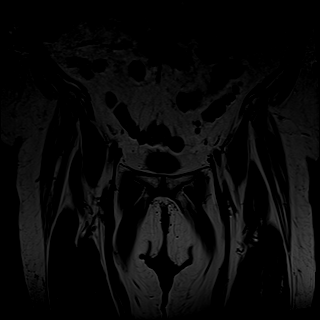

[Series 4: T2 fat-sat · coronal · 4.0mm · 1.19mm/px · 4 of 27 slices shown (1 of 3)]
[im 1/27]
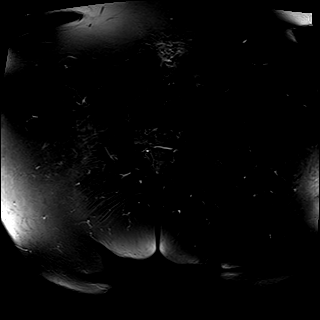
[im 9/27]
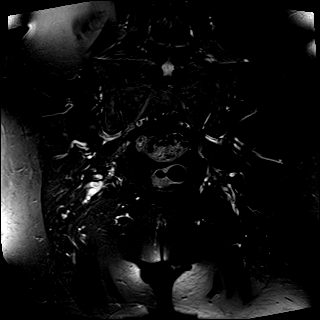
[im 18/27]
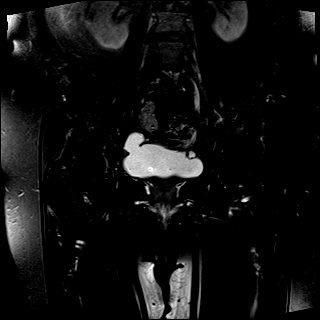
[im 27/27]
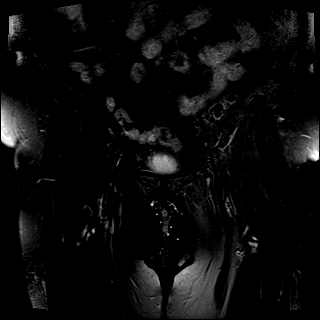

[Series 5: T2 fat-sat · axial · 4.0mm · 0.35mm/px · z∈[-194,-69]mm · 5 of 26 slices shown (2 of 3)]
[im 1/26]
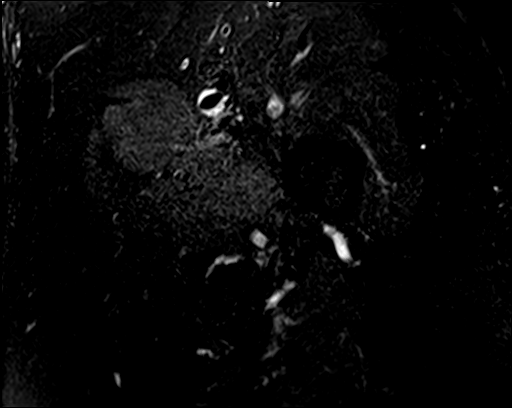
[im 7/26]
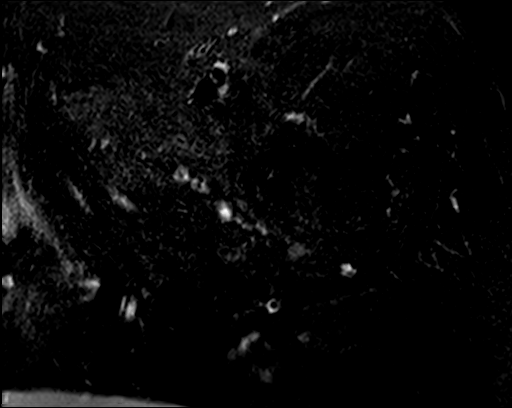
[im 13/26]
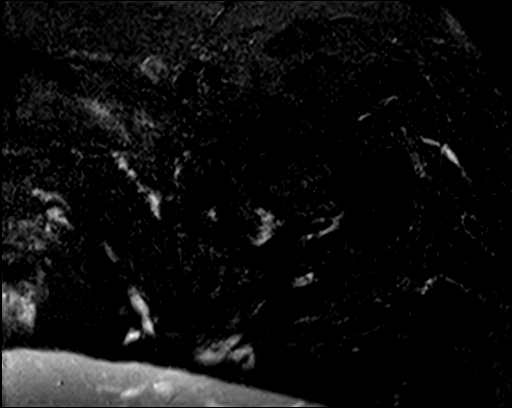
[im 19/26]
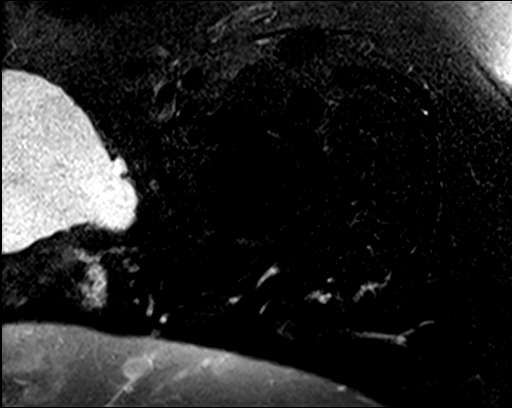
[im 26/26]
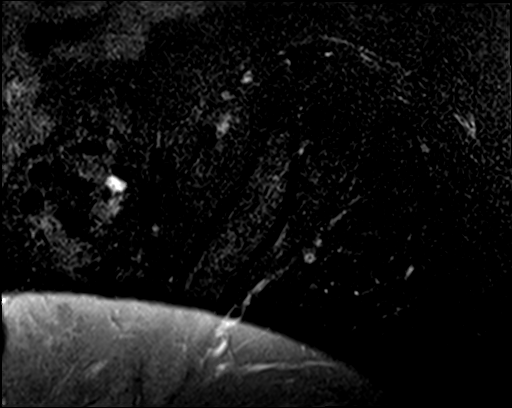

[Series 6: PD fat-sat · sagittal · 4.0mm · 0.70mm/px · 4 of 24 slices shown (1 of 2)]
[im 1/24]
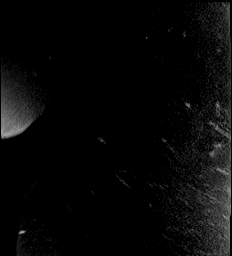
[im 8/24]
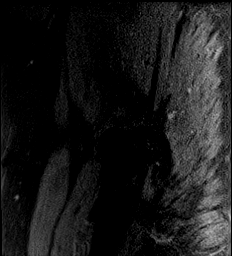
[im 16/24]
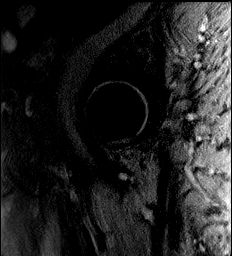
[im 24/24]
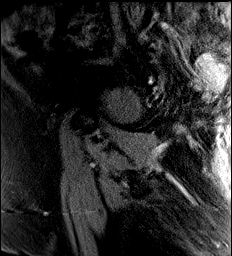

[Series 7: T2 fat-sat · axial · 4.0mm · 0.35mm/px · z∈[-194,-69]mm · 5 of 26 slices shown (3 of 3)]
[im 1/26]
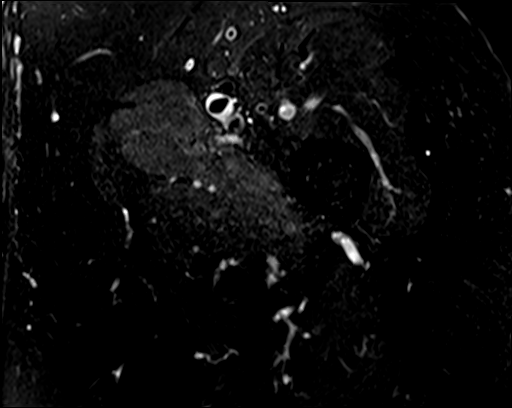
[im 7/26]
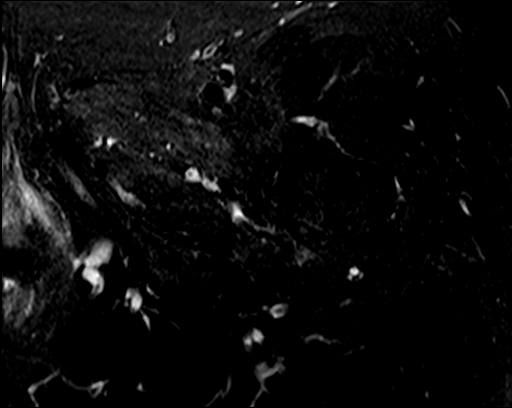
[im 13/26]
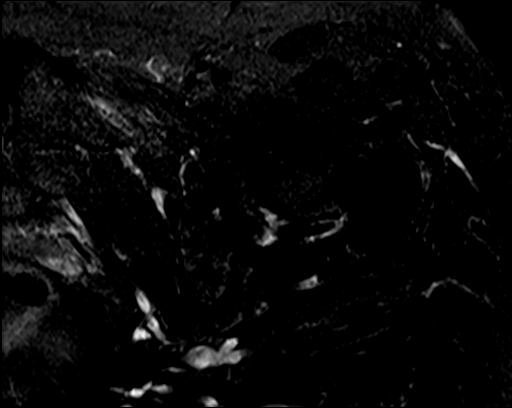
[im 19/26]
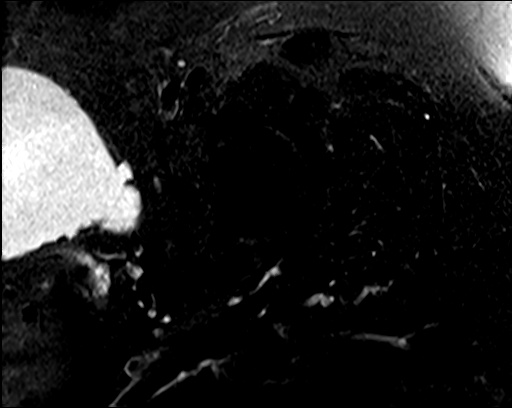
[im 26/26]
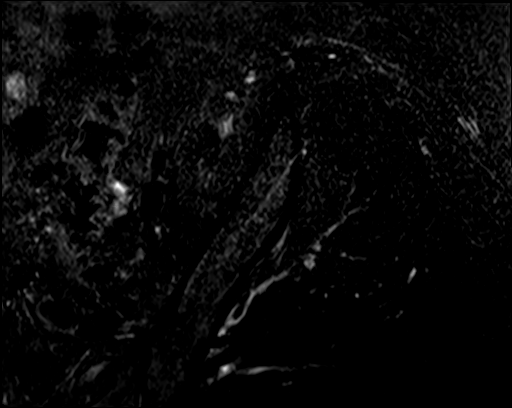

[Series 8: PD fat-sat · coronal · 4.0mm · 0.70mm/px · 4 of 22 slices shown (2 of 2)]
[im 1/22]
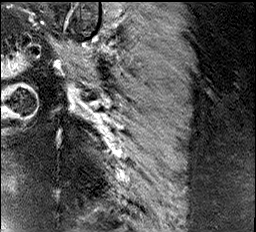
[im 8/22]
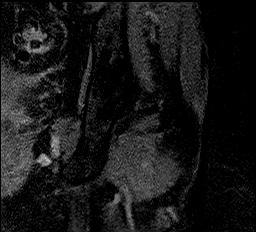
[im 15/22]
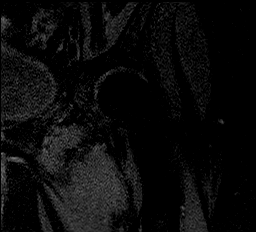
[im 22/22]
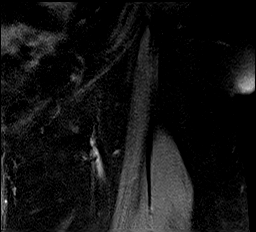

[Series 9: T1 fat-sat · axial · non-contrast · 4.0mm · 0.35mm/px · z∈[-194,-69]mm · 5 of 26 slices shown]
[im 1/26]
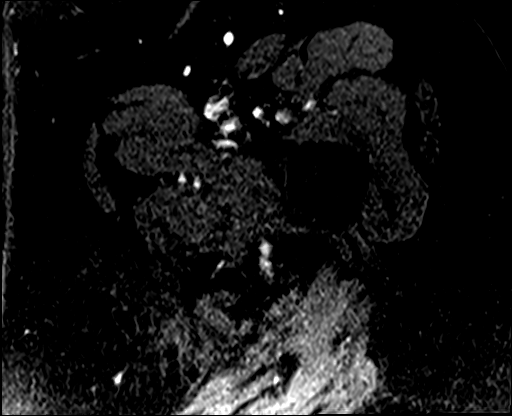
[im 7/26]
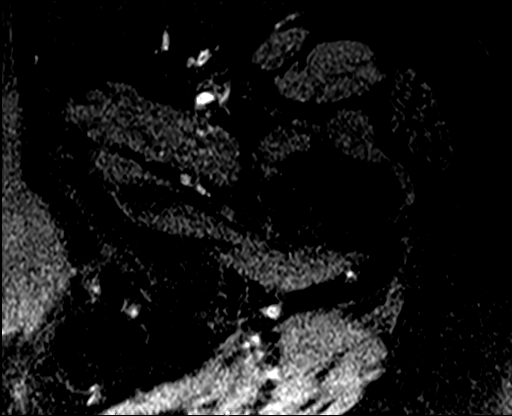
[im 13/26]
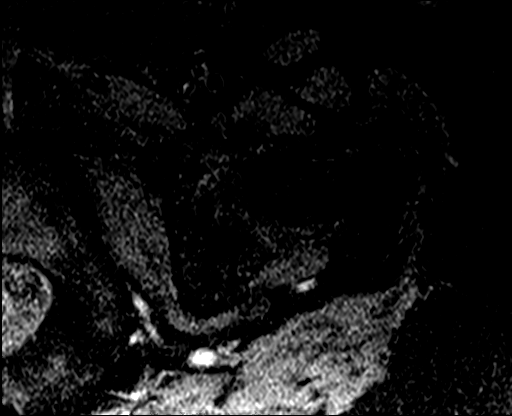
[im 19/26]
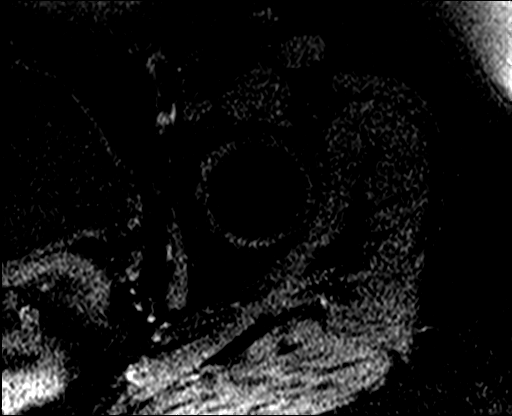
[im 26/26]
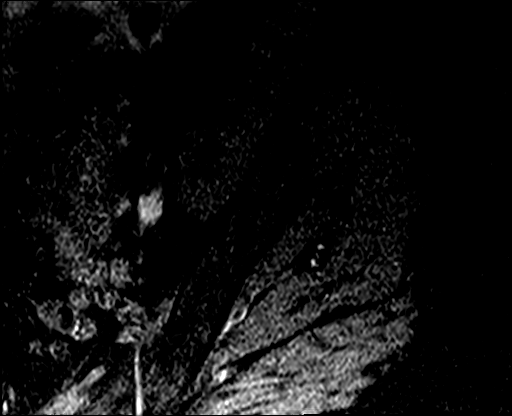

[Series 10: T1 fat-sat post-contrast · axial · 4.0mm · 0.35mm/px · 1 of 26 slices shown]
[im 1/26]
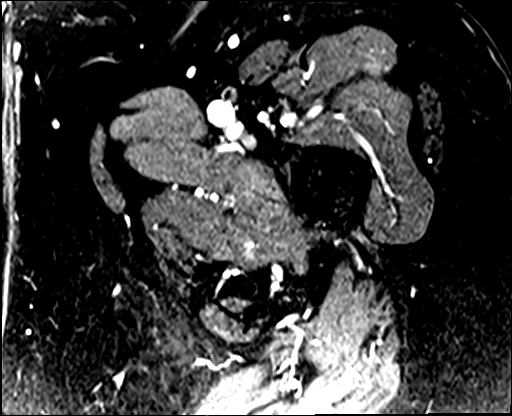

[32 of 40 positions shown; findings below may reference images not displayed]

FINDINGS: Despite efforts by the technologist and patient, motion artifact is
present on today's exam and could not be eliminated. This reduces
exam sensitivity and specificity.

Bones: Red marrow redistribution is present. This is nonspecific can
be associated with a variety of conditions including chronic anemia,
chronic heart failure, myelofibrosis, infiltrative marrow disease,
response to infection, and other conditions. Lower lumbar
degenerative disc disease and spondylosis, to be further discussed
on dedicated lumbar spine MRI obtained today.

Mild spurring of both femoral heads. No findings of avascular
necrosis, fracture, or regional stress/insufficiency fracture. No
focal abnormal osseous enhancement to suggest an osseous metastatic
lesion in the vicinity of the left hip. No significant SI joint
abnormality.

Articular cartilage and labrum

Articular cartilage: Mild to moderate degenerative chondral
thinning.

Labrum:  No paralabral cyst or gross tear identified.

Joint or bursal effusion

Joint effusion:  Absent

Bursae: No regional bursitis

Muscles and tendons

Muscles and tendons:  Unremarkable

Other findings

Miscellaneous: Sigmoid colon diverticulosis. Bladder diverticula.
Uterus absent.
IMPRESSION: 1. No findings of avascular necrosis, fracture, bony metastatic
lesion, or regional bursitis.
2. Mild to moderate degenerative chondral thinning in both hips.
3. Red marrow redistribution is present. This is nonspecific can be
associated with a variety of conditions including chronic anemia,
chronic heart failure, myelofibrosis, infiltrative marrow disease,
response to infection, and other conditions.
4. Lower lumbar degenerative disc disease and spondylosis, to be
further discussed on dedicated lumbar spine MRI obtained today.
5. Sigmoid colon diverticulosis.

## 2020-05-05 MED ORDER — GADOBENATE DIMEGLUMINE 529 MG/ML IV SOLN
16.0000 mL | Freq: Once | INTRAVENOUS | Status: AC | PRN
  Administered 2020-05-05: 16 mL via INTRAVENOUS

## 2020-05-08 ENCOUNTER — Other Ambulatory Visit: Payer: Self-pay

## 2020-05-11 ENCOUNTER — Other Ambulatory Visit: Payer: Self-pay | Admitting: Radiation Oncology

## 2020-05-11 ENCOUNTER — Telehealth: Payer: Self-pay | Admitting: *Deleted

## 2020-05-11 NOTE — Telephone Encounter (Signed)
CALLED PATIENT TO INFORM OF LABS FOR 05-14-20 - ARRIVAL TIME- 1:45 PM @ [place] CT TO FOLLOW ON 05-14-20 @ 2:45 PM @ [facility], PATIENT TO HAVE WATER ONLY 4 HRS. PRIOR TO TEST, SPOKE WITH PATIENT AND SHE IS AWARE OF THESE APPTS.

## 2020-05-12 ENCOUNTER — Ambulatory Visit: Payer: Medicare Other | Admitting: Radiation Oncology

## 2020-05-14 ENCOUNTER — Ambulatory Visit
Admission: RE | Admit: 2020-05-14 | Discharge: 2020-05-14 | Disposition: A | Discharge status: Home / Self Care | Payer: Medicare Other | Source: Ambulatory Visit | Attending: Radiation Oncology | Admitting: Radiation Oncology

## 2020-05-14 ENCOUNTER — Ambulatory Visit (HOSPITAL_COMMUNITY)
Admission: RE | Admit: 2020-05-14 | Discharge: 2020-05-14 | Disposition: A | Discharge status: Home / Self Care | Payer: Medicare Other | Source: Ambulatory Visit | Attending: Radiation Oncology | Admitting: Radiation Oncology

## 2020-05-14 ENCOUNTER — Other Ambulatory Visit: Payer: Self-pay

## 2020-05-14 ENCOUNTER — Inpatient Hospital Stay: Payer: Medicare Other

## 2020-05-14 ENCOUNTER — Encounter (HOSPITAL_COMMUNITY): Payer: Self-pay

## 2020-05-14 DIAGNOSIS — E785 Hyperlipidemia, unspecified: Secondary | ICD-10-CM | POA: Diagnosis not present

## 2020-05-14 DIAGNOSIS — J8410 Pulmonary fibrosis, unspecified: Secondary | ICD-10-CM | POA: Diagnosis not present

## 2020-05-14 DIAGNOSIS — E890 Postprocedural hypothyroidism: Secondary | ICD-10-CM | POA: Diagnosis not present

## 2020-05-14 DIAGNOSIS — I10 Essential (primary) hypertension: Secondary | ICD-10-CM | POA: Diagnosis not present

## 2020-05-14 DIAGNOSIS — C3412 Malignant neoplasm of upper lobe, left bronchus or lung: Secondary | ICD-10-CM

## 2020-05-14 DIAGNOSIS — J449 Chronic obstructive pulmonary disease, unspecified: Secondary | ICD-10-CM | POA: Diagnosis not present

## 2020-05-14 DIAGNOSIS — M1612 Unilateral primary osteoarthritis, left hip: Secondary | ICD-10-CM | POA: Diagnosis not present

## 2020-05-14 DIAGNOSIS — N183 Chronic kidney disease, stage 3 unspecified: Secondary | ICD-10-CM | POA: Diagnosis not present

## 2020-05-14 DIAGNOSIS — F325 Major depressive disorder, single episode, in full remission: Secondary | ICD-10-CM | POA: Diagnosis not present

## 2020-05-14 DIAGNOSIS — I700 Atherosclerosis of aorta: Secondary | ICD-10-CM | POA: Diagnosis not present

## 2020-05-14 DIAGNOSIS — M1611 Unilateral primary osteoarthritis, right hip: Secondary | ICD-10-CM | POA: Diagnosis not present

## 2020-05-14 DIAGNOSIS — G4700 Insomnia, unspecified: Secondary | ICD-10-CM | POA: Diagnosis not present

## 2020-05-14 DIAGNOSIS — I129 Hypertensive chronic kidney disease with stage 1 through stage 4 chronic kidney disease, or unspecified chronic kidney disease: Secondary | ICD-10-CM | POA: Diagnosis not present

## 2020-05-14 DIAGNOSIS — C3490 Malignant neoplasm of unspecified part of unspecified bronchus or lung: Secondary | ICD-10-CM | POA: Diagnosis not present

## 2020-05-14 DIAGNOSIS — I2510 Atherosclerotic heart disease of native coronary artery without angina pectoris: Secondary | ICD-10-CM | POA: Diagnosis not present

## 2020-05-14 LAB — BUN & CREATININE (CHCC)
BUN: 20 mg/dL (ref 8–23)
Creatinine: 1.08 mg/dL — ABNORMAL HIGH (ref 0.44–1.00)
GFR, Est AFR Am: 58 mL/min — ABNORMAL LOW (ref >60)
GFR, Estimated: 50 mL/min — ABNORMAL LOW (ref >60)

## 2020-05-14 IMAGING — CT CT CHEST W/ CM
2 of 3 series · 15 of 36 positions shown, 18 images · IV contrast (OMNIPAQUE)
Comparison: [DATE], [DATE], [DATE]

CLINICAL DATA: Follow-up lung cancer, definitive SBRT complete

EXAM:
CT CHEST WITH CONTRAST
TECHNIQUE: Multidetector CT imaging of the chest was performed during
intravenous contrast administration.
CONTRAST:  75mL OMNIPAQUE IOHEXOL 300 MG/ML  SOLN

[Series 2: axial st · axial · 0.84mm/px · z∈[+606,+862]mm · 12 of 152 slices shown, 15 images]
[im 12/152  mediastinal]
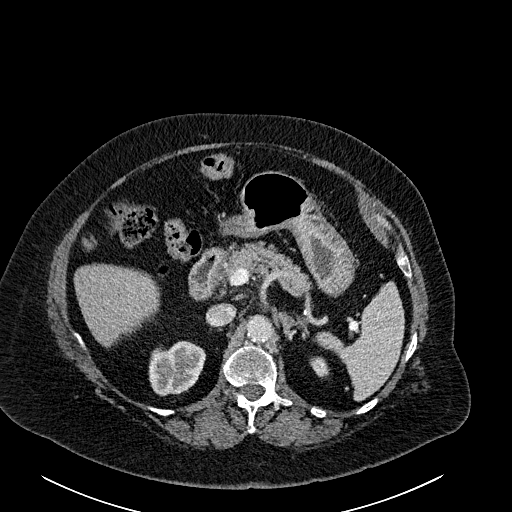
[im 12/152  lung]
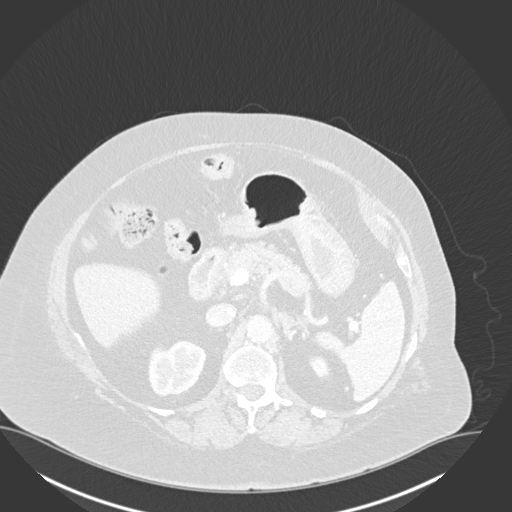
[im 23/152  lung]
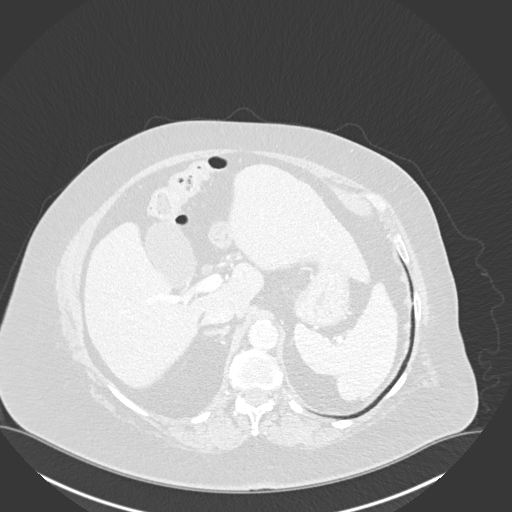
[im 34/152  lung]
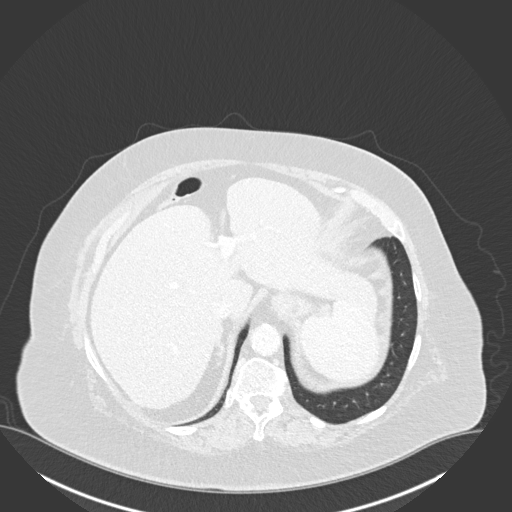
[im 45/152  lung]
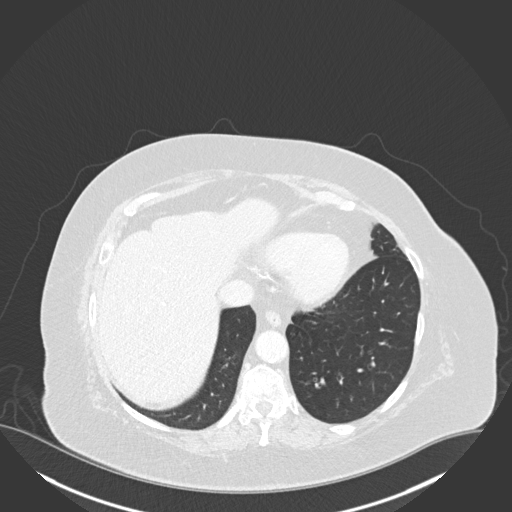
[im 56/152  mediastinal]
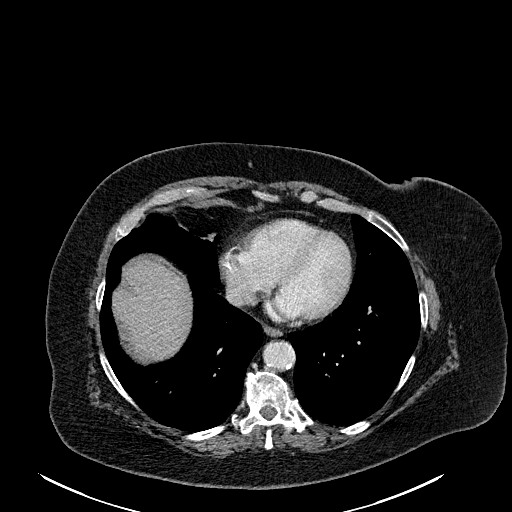
[im 56/152  lung]
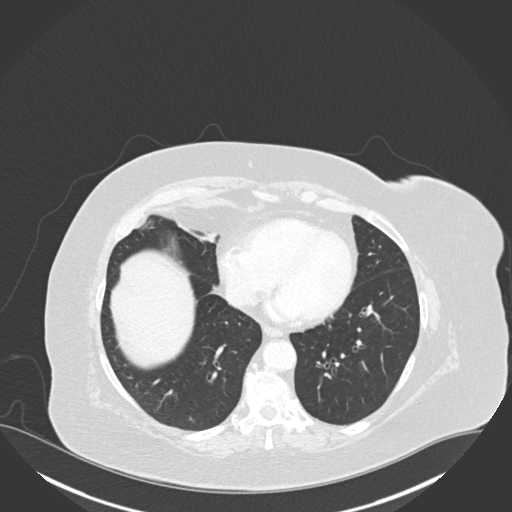
[im 68/152  lung]
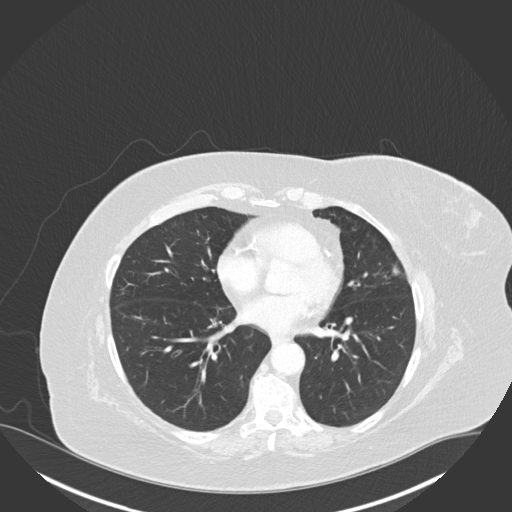
[im 84/152  lung]
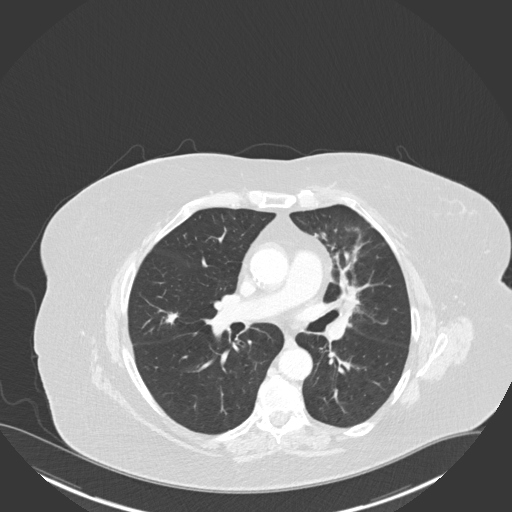
[im 96/152  lung]
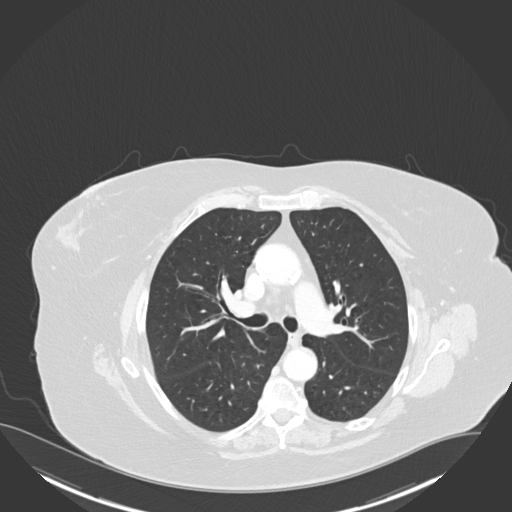
[im 107/152  mediastinal]
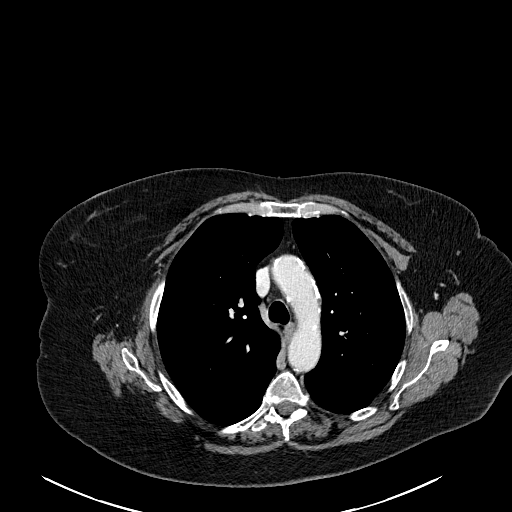
[im 107/152  lung]
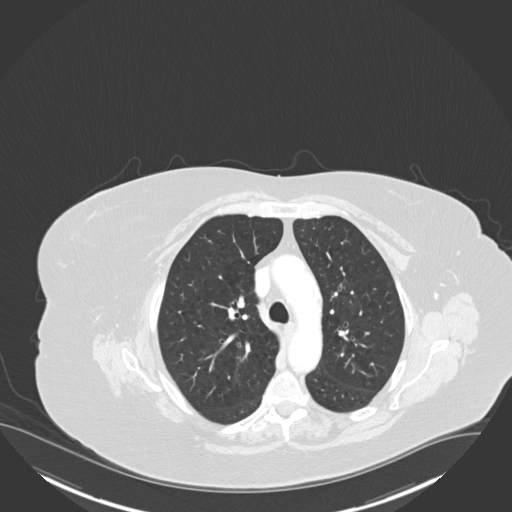
[im 118/152  lung]
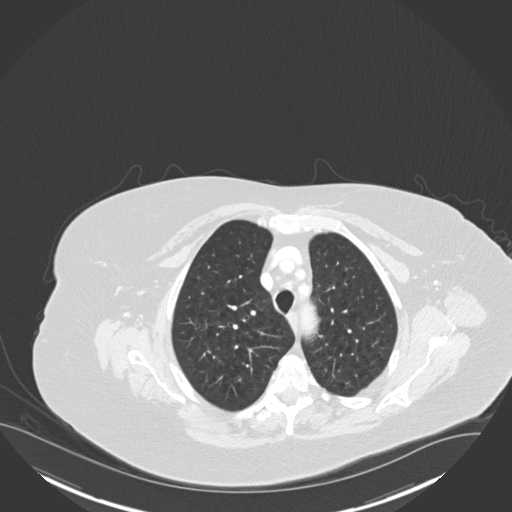
[im 129/152  lung]
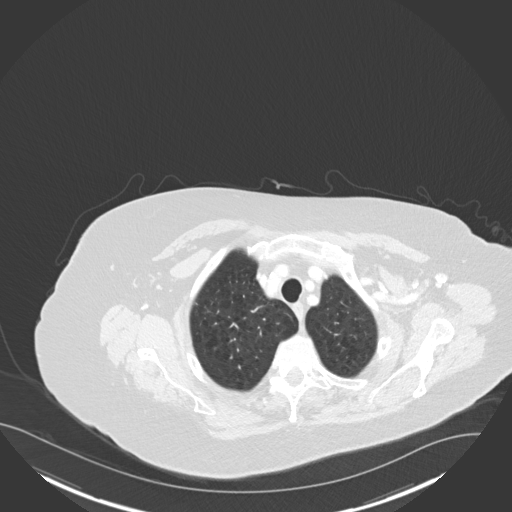
[im 140/152  lung]
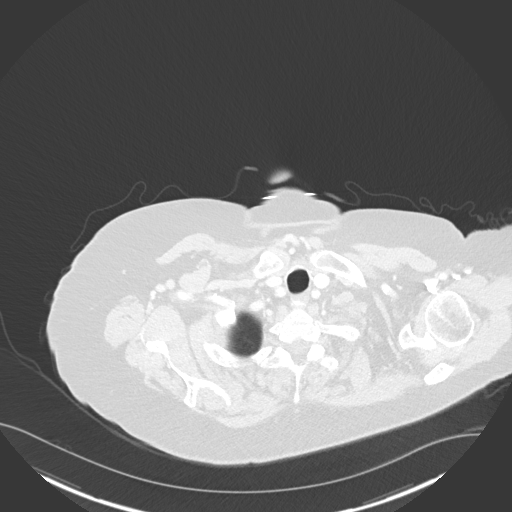

[Series 6: coronal · coronal · 0.64mm/px · 3 of 178 slices shown]
[im 36/178  lung]
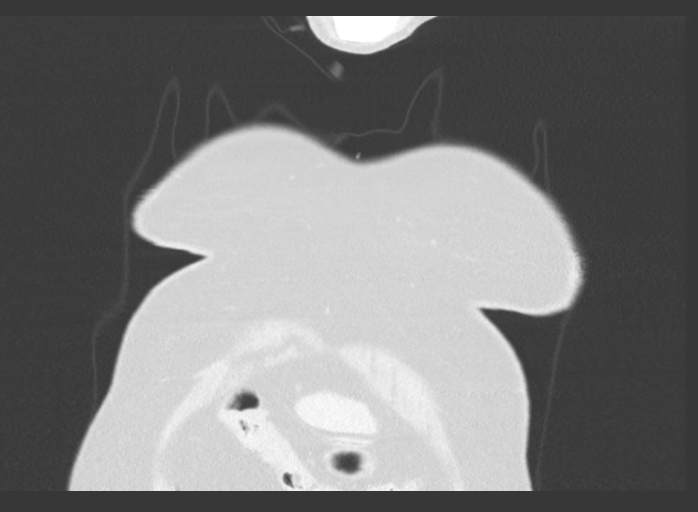
[im 71/178  lung]
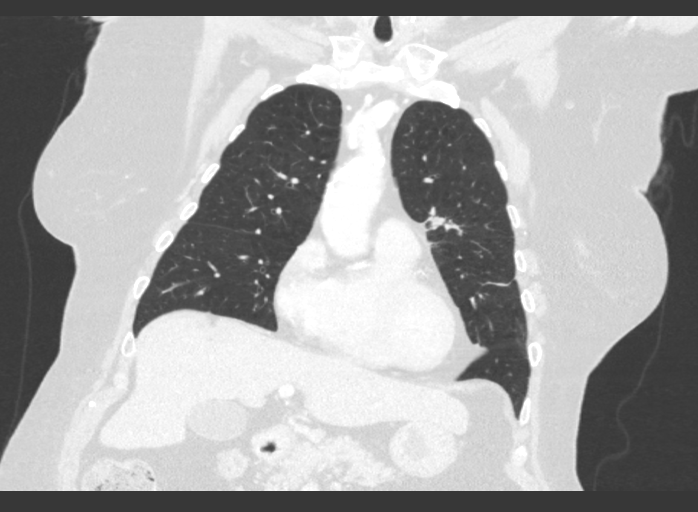
[im 107/178  lung]
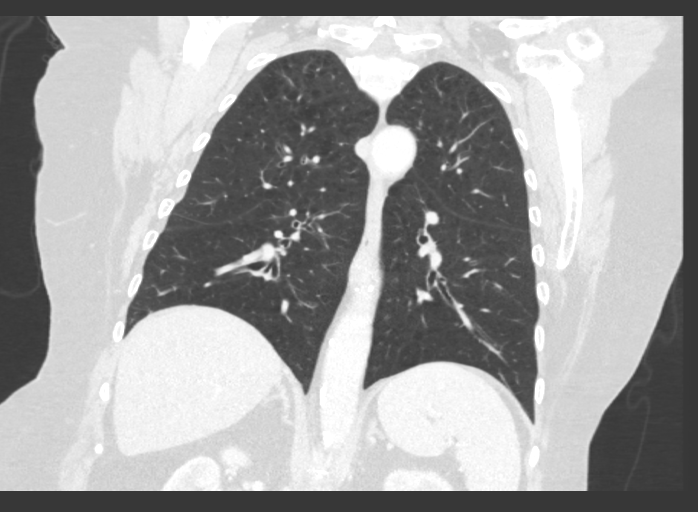

[15 of 36 positions shown; findings below may reference images not displayed]

FINDINGS: Cardiovascular: Aortic atherosclerosis. Normal heart size.
Three-vessel coronary artery calcifications. No pericardial
effusion.

Mediastinum/Nodes: No enlarged mediastinal, hilar, or axillary lymph
nodes. Thyroid gland, trachea, and esophagus demonstrate no
significant findings.

Lungs/Pleura: Moderate centrilobular emphysema. Interval increase in
fibrotic consolidation about a treated nodule of the left upper
lobe, which cannot be clearly distinguished from adjacent fibrosis
(series 5, image 69). Continued interval increase in size of a
lobulated, mixed solid nodule of the superior left upper lobe, now
measuring 1.2 x 1.1 cm, previously 9 mm (series 5, image 41).
Spiculated nodule of the lateral segment right middle lobe is not
significantly changed, measuring 1.1 x 0.8 cm (series 5, image 70).
No pleural effusion or pneumothorax.

Upper Abdomen: No acute abnormality. Stable, definitively benign
adenoma of the lateral limb of the left adrenal gland (series 2,
image 149).

Musculoskeletal: No chest wall mass or suspicious bone lesions
identified.
IMPRESSION: 1. Interval increase in fibrotic consolidation about a treated
nodule of the left upper lobe, which cannot be clearly distinguished
from adjacent fibrosis.
2. Continued interval increase in size of a lobulated, mixed solid
nodule of the superior left upper lobe, now measuring 1.2 x 1.1 cm,
previously 9 mm, which remains highly concerning for malignancy.
3. Spiculated nodule of the lateral segment right middle lobe is not
significantly changed, measuring 1.1 x 0.8 cm. Despite interval
stability, this remains morphologically concerning for malignancy.
Attention on follow-up.
4. Emphysema ([KQ]-[KQ]).
5. Coronary artery disease. Aortic Atherosclerosis ([KQ]-[KQ]).

## 2020-05-14 MED ORDER — IOHEXOL 300 MG/ML  SOLN
75.0000 mL | Freq: Once | INTRAMUSCULAR | Status: AC | PRN
  Administered 2020-05-14: 75 mL via INTRAVENOUS

## 2020-05-14 MED ORDER — SODIUM CHLORIDE (PF) 0.9 % IJ SOLN
INTRAMUSCULAR | Status: AC
  Filled 2020-05-14: qty 50

## 2020-05-18 ENCOUNTER — Encounter: Payer: Self-pay | Admitting: Radiation Oncology

## 2020-05-18 ENCOUNTER — Telehealth: Payer: Self-pay

## 2020-05-18 ENCOUNTER — Other Ambulatory Visit: Payer: Self-pay

## 2020-05-18 ENCOUNTER — Ambulatory Visit: Payer: Medicare Other | Admitting: Radiation Oncology

## 2020-05-18 NOTE — Telephone Encounter (Signed)
Spoke with patient in regards to telephone visit with Shona Simpson PA on 05/19/20 at 8:30 am. Patient verbalized understanding of appointment date and time. Meaningful use questions were reviewed. TM

## 2020-05-19 ENCOUNTER — Other Ambulatory Visit: Payer: Self-pay

## 2020-05-19 ENCOUNTER — Ambulatory Visit
Admission: RE | Admit: 2020-05-19 | Discharge: 2020-05-19 | Disposition: A | Discharge status: Home / Self Care | Payer: Medicare Other | Source: Ambulatory Visit | Attending: Radiation Oncology | Admitting: Radiation Oncology

## 2020-05-19 DIAGNOSIS — R918 Other nonspecific abnormal finding of lung field: Secondary | ICD-10-CM | POA: Diagnosis not present

## 2020-05-19 DIAGNOSIS — Z08 Encounter for follow-up examination after completed treatment for malignant neoplasm: Secondary | ICD-10-CM | POA: Diagnosis not present

## 2020-05-19 DIAGNOSIS — C3412 Malignant neoplasm of upper lobe, left bronchus or lung: Secondary | ICD-10-CM

## 2020-05-19 NOTE — Progress Notes (Signed)
Radiation Oncology         ([phone]) [phone] ________________________________  Outpatient Follow Up - Conducted via telephone due to current COVID-19 concerns for limiting patient exposure  I spoke with the patient to conduct this consult visit via telephone to spare the patient unnecessary potential exposure in the healthcare setting during the current COVID-19 pandemic. The patient was notified in advance and was offered a [place] meeting to allow for face to face communication but unfortunately reported that they did not have the appropriate resources/technology to support such a visit and instead preferred to proceed with a telephone visit.   ________________________________  Name: Holly [name] MRN: [id number]  Date of Service: 05/19/2020  DOB: 07/09/1946  Diagnosis:    Stage IA2, cT1bN0M0, NSCLC, squamous cell carcinoma of the LUL.  Interval Since Last Radiation:  10 months  07/30/2019-08/06/2019 SBRT Treatment: The LUL target was treated to 54 Gy in 3 fractions.    Narrative:  Holly [name] is a pleasant 74 y.o. female originally seen at the request of Dr. Kipp Brood with a history of COPD, oxygen dependant at 2L [place] for the last three years. She was previously a patient of Dr. Lake Bells, but following pneumonia that required admission at [facility] about 2 years ago when she started needing O2, she continued with Dr. Alcide Clever. She was recently found on CT imaging in July 2020 to have a 12 x 8 mm left upper lobe nodule and a nodule in the RML measuring 13 x 7 mm. She underwent a PET scan on 06/05/2019 revealed hypermetabolic change with an SUV of 8.5 in the LUL and this measured 12 mm, and in the RML the nodule was 13 mm, and had an SUV of 1.4. She did not have any hypermetabolic adenopathy. A left adrenal adenoma was noted as well and was not hypermetabolic. She underwent navigational bronchoscopy on 06/25/2019 with Dr. Kipp Brood and a biopsy and brushing of the LUL revealed squamous cell  carcinoma. The RML lesion was also sampled, and atypical cells were noted but not diagnostic of cancer. She was discussed in [facility] conference and she did not appear to be a good candidate for surgical resection, and rather that she would benefit from SBRT to the LUL and probably follow the RML with serial imaging prior to intervening with treatment.  She did proceed with stereotactic body radiotherapy which she completed on 08/06/2019 to the left upper lobe.  Her posttreatment imaging has shown stability in the treated site, along with stability in the RML nodule as well.  CT on 02/04/20 revealed stability again in the LUL and RML nodule, but did identify a change in a separate LUL nodule measuring 9 mm, 7 mm previously. Dr. Lisbeth Renshaw recommended repeating her scan at a 3 month interval and this was performed on 05/14/20. The treated LUL nodule was stable overall with expected postradiotherapy scaring around the site. The RML nodule was stable, but the newer area in the LUL was now 12 x 11 mm. She's contacted to review these results.   On review of systems, the patient reports that she is doing well overall. She denies any chest pain, she does haveshortness of breath , cough, fevers, chills, night sweats, unintended weight changes. She denies any bowel or bladder disturbances, and denies abdominal pain, nausea or vomiting. She denies any new musculoskeletal or joint aches or pains, new skin lesions or concerns. A complete review of systems is obtained and is otherwise negative.   Past Medical History:  Past  Medical History:  Diagnosis Date   Anxiety    Asthma    Basal cell carcinoma    Bruises easily    Cataract    Cervicalgia    COPD (chronic obstructive pulmonary disease) (HCC)    emphysema and COPD   Dyspnea    uses O2 only when needed. 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   Hypercalcemia    Hypertension    Hypothyroidism     Mixed hyperlipidemia    Pneumonia    Pulmonary nodule    Sleep apnea    does not wear cpap   Thyroid neoplasm    Urinary incontinence    Wears dentures    Wears glasses     Past Surgical History: Past Surgical History:  Procedure Laterality Date   COLONOSCOPY     DENTAL SURGERY     ELBOW ARTHROPLASTY Right 2020   EYE SURGERY Right    cataract surgery   FOOT SURGERY  30 YRS AGO   BILATERAL   ORIF ELBOW FRACTURE Right 12/06/2017   Procedure: OPEN REDUCTION INTERNAL FIXATION (ORIF) ELBOW/OLECRANON FRACTURE;  Surgeon: Shona Needles, MD;  Location: [facility];  Service: Orthopedics;  Laterality: Right;   SKIN CANCER EXCISION     THYROIDECTOMY N/A 07/31/2014   Procedure: TOTAL THYROIDECTOMY;  Surgeon: Armandina Gemma, MD;  Location: [facility];  Service: General;  Laterality: N/A;   VAGINAL HYSTERECTOMY     VIDEO BRONCHOSCOPY WITH ENDOBRONCHIAL NAVIGATION Bilateral 06/25/2019   Procedure: VIDEO BRONCHOSCOPY WITH ENDOBRONCHIAL NAVIGATION;  Surgeon: Lajuana Matte, MD;  Location: [facility];  Service: Thoracic;  Laterality: Bilateral;    Social History:  Social History   Socioeconomic History   Marital status: Widowed    Spouse name: Not on file   Number of children: 2   Years of education: Not on file   Highest education level: Not on file  Occupational History   Occupation: [employer]  Tobacco Use   Smoking status: Former Smoker    Packs/day: 1.00    Years: 30.00    Pack years: 30.00    Types: Cigarettes    Quit date: 02/24/2015    Years since quitting: 5.2   Smokeless tobacco: Never Used  Vaping Use   Vaping Use: Former  Substance and Sexual Activity   Alcohol use: Yes    Alcohol/week: 0.0 standard drinks    Comment: very seldom   Drug use: Never   Sexual activity: Not on file  Other Topics Concern   Not on file  Social History Narrative   Not on file   Social Determinants of Health   Financial Resource Strain:    Difficulty of  Paying Living Expenses:   Food Insecurity:    Worried About Charity fundraiser in the Last Year:    Arboriculturist in the Last Year:   Transportation Needs: No Transportation Needs   Lack of Transportation (Medical): No   Lack of Transportation (Non-Medical): No  Physical Activity:    Days of Exercise per Week:    Minutes of Exercise per Session:   Stress:    Feeling of Stress :   Social Connections:    Frequency of Communication with Friends and Family:    Frequency of Social Gatherings with Friends and Family:    Attends Religious Services:    Active Member of Clubs or Organizations:  Attends Archivist Meetings:    Marital Status:   Intimate Partner Violence:    Fear of Current or Ex-Partner:    Emotionally Abused:    Physically Abused:    Sexually Abused:   The patient is widowed and lives in [place] in [place].  Family History: Family History  Problem Relation Age of Onset   Coronary artery disease Father    Hypertension Father    Asthma Father    Allergies Father    Hypertension Mother    Lung cancer Mother      Medications: Current Outpatient Medications  Medication Sig Dispense Refill   ALPRAZolam (XANAX) 0.25 MG tablet Take 0.25 mg by mouth 2 (two) times daily as needed for anxiety.      aspirin 81 MG tablet Take 81 mg by mouth daily.     atorvastatin (LIPITOR) 20 MG tablet Take 20 mg by mouth at bedtime.      buPROPion (WELLBUTRIN XL) 300 MG 24 hr tablet Take 300 mg by mouth daily.     busPIRone (BUSPAR) 30 MG tablet Take 30 mg by mouth 2 (two) times daily.     Fluticasone-Umeclidin-Vilant (TRELEGY ELLIPTA) 100-62.5-25 MCG/INH AEPB Inhale 1 puff into the lungs daily.     levothyroxine (SYNTHROID) 112 MCG tablet Take 112 mcg by mouth daily before breakfast. Once daily  0   metoprolol succinate (TOPROL-XL) 50 MG 24 hr tablet Take 50 mg by mouth daily. Take with or immediately following a meal.     VENTOLIN  HFA 108 (90 Base) MCG/ACT inhaler INHALE 2 PUFFS INTO THE LUNGS EVERY 4 HOURS AS NEEDED FOR WHEEZING ORSHORTNESS OF BREATH (Patient taking differently: Inhale 2 puffs into the lungs every 4 (four) hours as needed for wheezing or shortness of breath. ) 8 g 0   No current facility-administered medications for this encounter.     Allergies:  Allergies  Allergen Reactions   Codeine Nausea And Vomiting   Oxycodone-Acetaminophen Nausea And Vomiting   Physical Exam: Unable to assess due to encounter type.  Impression/Plan: 1.         Stage IA2, cT1bN0M0, NSCLC, squamous cell carcinoma of the LUL.  The patient is doing well clinically and the previously treated site is radiographically stable. We will follow this area expectantly on subsequent imaging. 2.        Putative Stage IA2, cT1bN0M0, NSCLC of the LUL. The lesion has again increased in the last 3 month interval and appears to be consistent with the trajectory of a slow growing early stage cancer. Dr. Lisbeth Renshaw recommends proceeding with stereotactic body radiotherapy (SBRT) in 3-5 fractions. We discussed the risks, benefits, short, and long term effects of radiotherapy, and the patient is interested in proceeding. She will come in on Tuesday at 10 am next week for simulation at which time she will sign written consent to proceed. 3. RML nodule with atypical cells on biopsy but below FDG affinity on PET scan.  Fortunately the right middle lobe nodule continues to remain stable and Dr. Lisbeth Renshaw favors following this expectantly. 4. COPD. The patient will continue under the care of  Dr. Alcide Clever in [place].   5. Left Adrenal Adenoma. The patient does not appear to have clinical consequences of this but will discuss this further with Dr. Dema Severin when she's seen next.   Given current concerns for patient exposure during the COVID-19 pandemic, this encounter was conducted via telephone.  The patient has provided two factor identification and has given  verbal consent for this type of encounter and has been advised to only accept a meeting of this type in a secure network environment. The time spent during this encounter was 30 minutes including preparation, discussion, and coordination of the patient's care. The attendants for this meeting include Hayden Pedro  and Holly [name] and her daughter Holly [name]. During the encounter, Hayden Pedro were located at [facility] Radiation Oncology Department.  Holly [name] was located at home with her daughter.      Carola Rhine, PAC

## 2020-05-22 DIAGNOSIS — J449 Chronic obstructive pulmonary disease, unspecified: Secondary | ICD-10-CM | POA: Diagnosis not present

## 2020-05-22 DIAGNOSIS — J9611 Chronic respiratory failure with hypoxia: Secondary | ICD-10-CM | POA: Diagnosis not present

## 2020-05-22 DIAGNOSIS — G4733 Obstructive sleep apnea (adult) (pediatric): Secondary | ICD-10-CM | POA: Diagnosis not present

## 2020-05-22 DIAGNOSIS — R918 Other nonspecific abnormal finding of lung field: Secondary | ICD-10-CM | POA: Diagnosis not present

## 2020-05-22 DIAGNOSIS — R5383 Other fatigue: Secondary | ICD-10-CM | POA: Diagnosis not present

## 2020-05-22 DIAGNOSIS — F411 Generalized anxiety disorder: Secondary | ICD-10-CM | POA: Diagnosis not present

## 2020-05-26 ENCOUNTER — Ambulatory Visit
Admission: RE | Admit: 2020-05-26 | Discharge: 2020-05-26 | Disposition: A | Discharge status: Home / Self Care | Payer: Medicare Other | Source: Ambulatory Visit | Attending: Radiation Oncology | Admitting: Radiation Oncology

## 2020-05-26 ENCOUNTER — Other Ambulatory Visit: Payer: Self-pay

## 2020-05-26 DIAGNOSIS — C3412 Malignant neoplasm of upper lobe, left bronchus or lung: Secondary | ICD-10-CM | POA: Diagnosis not present

## 2020-05-29 DIAGNOSIS — C3412 Malignant neoplasm of upper lobe, left bronchus or lung: Secondary | ICD-10-CM | POA: Diagnosis not present

## 2020-06-02 ENCOUNTER — Ambulatory Visit
Admission: RE | Admit: 2020-06-02 | Discharge: 2020-06-02 | Disposition: A | Discharge status: Home / Self Care | Payer: Medicare Other | Source: Ambulatory Visit | Attending: Radiation Oncology | Admitting: Radiation Oncology

## 2020-06-02 ENCOUNTER — Other Ambulatory Visit: Payer: Self-pay

## 2020-06-02 DIAGNOSIS — C3412 Malignant neoplasm of upper lobe, left bronchus or lung: Secondary | ICD-10-CM | POA: Diagnosis not present

## 2020-06-03 ENCOUNTER — Ambulatory Visit: Payer: Medicare Other | Admitting: Radiation Oncology

## 2020-06-04 ENCOUNTER — Ambulatory Visit
Admission: RE | Admit: 2020-06-04 | Discharge: 2020-06-04 | Disposition: A | Discharge status: Home / Self Care | Payer: Medicare Other | Source: Ambulatory Visit | Attending: Radiation Oncology | Admitting: Radiation Oncology

## 2020-06-04 ENCOUNTER — Other Ambulatory Visit: Payer: Self-pay

## 2020-06-04 DIAGNOSIS — C3412 Malignant neoplasm of upper lobe, left bronchus or lung: Secondary | ICD-10-CM | POA: Diagnosis not present

## 2020-06-08 NOTE — Progress Notes (Signed)
Radiation Oncology         [phone] ________________________________  Name: Holly [name] MRN: [id number]  Date of Service: 06/09/2020 DOB: 12-02-1945  Post Treatment Note  CC: Harlan Stains, MD  Harlan Stains, MD  Diagnosis:  Stage IA2, cT1bN0M0, NSCLC, squamous cell carcinoma of the LUL with Putative Stage IA2, cT1bN0M0, NSCLC of the LUL  Interval Since Last Radiation:  today  06/02/20-06/09/20 SBRT Treatment: The separate LUL target different from 2020 treatment was treated to 54 Gy in 3 fractions.  07/30/2019-08/06/2019 SBRT Treatment: The LUL target was treated to 54 Gy in 3 fractions.   Narrative:  The patient returns today her last of 3 fractions to a second lesion in the LUL. She has tolerated treatment well.  She feels well and denies any shortness of breath or chest pain. No other complaints are noted.               ALLERGIES:  is allergic to codeine and oxycodone-acetaminophen.  Meds: Current Outpatient Medications  Medication Sig Dispense Refill  . ALPRAZolam (XANAX) 0.25 MG tablet Take 0.25 mg by mouth 2 (two) times daily as needed for anxiety.     Marland Kitchen aspirin 81 MG tablet Take 81 mg by mouth daily.    Marland Kitchen atorvastatin (LIPITOR) 20 MG tablet Take 20 mg by mouth at bedtime.     Marland Kitchen buPROPion (WELLBUTRIN XL) 300 MG 24 hr tablet Take 300 mg by mouth daily.    . busPIRone (BUSPAR) 30 MG tablet Take 30 mg by mouth 2 (two) times daily.    . Fluticasone-Umeclidin-Vilant (TRELEGY ELLIPTA) 100-62.5-25 MCG/INH AEPB Inhale 1 puff into the lungs daily.    Marland Kitchen levothyroxine (SYNTHROID) 112 MCG tablet Take 112 mcg by mouth daily before breakfast. Once daily  0  . metoprolol succinate (TOPROL-XL) 50 MG 24 hr tablet Take 50 mg by mouth daily. Take with or immediately following a meal.    . VENTOLIN HFA 108 (90 Base) MCG/ACT inhaler INHALE 2 PUFFS INTO THE LUNGS EVERY 4 HOURS AS NEEDED FOR WHEEZING ORSHORTNESS OF BREATH (Patient taking differently: Inhale 2 puffs into the lungs every 4  (four) hours as needed for wheezing or shortness of breath. ) 8 g 0   No current facility-administered medications for this encounter.    Physical Findings: Wt Readings from Last 3 Encounters:  06/09/20 174 lb 12.8 oz (79.3 kg)  07/09/19 170 lb (77.1 kg)  06/25/19 170 lb (77.1 kg)   Temp Readings from Last 3 Encounters:  06/09/20 98 F (36.7 C)  06/25/19 97.8 F (36.6 C)  06/21/19 97.8 F (36.6 C) (Skin)   BP Readings from Last 3 Encounters:  06/09/20 (!) 118/51  06/25/19 (!) 137/58  06/21/19 (!) 150/67   Pulse Readings from Last 3 Encounters:  06/09/20 66  06/25/19 78  06/21/19 67    In general this is a well appearing caucasian female in no acute distress. She's alert and oriented x4 and appropriate throughout the examination. Cardiopulmonary assessment is negative for acute distress and she exhibits normal effort.   Lab Findings: Lab Results  Component Value Date   WBC 7.5 06/25/2019   HGB 13.6 06/25/2019   HCT 43.2 06/25/2019   MCV 101.2 (H) 06/25/2019   PLT 154 06/25/2019     Radiographic Findings: CT CHEST W CONTRAST  Result Date: 05/15/2020 CLINICAL DATA:  Follow-up lung cancer, definitive SBRT complete EXAM: CT CHEST WITH CONTRAST TECHNIQUE: Multidetector CT imaging of the chest was performed during intravenous contrast administration.  CONTRAST:  70mL OMNIPAQUE IOHEXOL 300 MG/ML  SOLN COMPARISON:  02/04/2020, 09/16/2019, 06/21/2019 FINDINGS: Cardiovascular: Aortic atherosclerosis. Normal heart size. Three-vessel coronary artery calcifications. No pericardial effusion. Mediastinum/Nodes: No enlarged mediastinal, hilar, or axillary lymph nodes. Thyroid gland, trachea, and esophagus demonstrate no significant findings. Lungs/Pleura: Moderate centrilobular emphysema. Interval increase in fibrotic consolidation about a treated nodule of the left upper lobe, which cannot be clearly distinguished from adjacent fibrosis (series 5, image 69). Continued interval increase  in size of a lobulated, mixed solid nodule of the superior left upper lobe, now measuring 1.2 x 1.1 cm, previously 9 mm (series 5, image 41). Spiculated nodule of the lateral segment right middle lobe is not significantly changed, measuring 1.1 x 0.8 cm (series 5, image 70). No pleural effusion or pneumothorax. Upper Abdomen: No acute abnormality. Stable, definitively benign adenoma of the lateral limb of the left adrenal gland (series 2, image 149). Musculoskeletal: No chest wall mass or suspicious bone lesions identified. IMPRESSION: 1. Interval increase in fibrotic consolidation about a treated nodule of the left upper lobe, which cannot be clearly distinguished from adjacent fibrosis. 2. Continued interval increase in size of a lobulated, mixed solid nodule of the superior left upper lobe, now measuring 1.2 x 1.1 cm, previously 9 mm, which remains highly concerning for malignancy. 3. Spiculated nodule of the lateral segment right middle lobe is not significantly changed, measuring 1.1 x 0.8 cm. Despite interval stability, this remains morphologically concerning for malignancy. Attention on follow-up. 4. Emphysema (ICD10-J43.9). 5. Coronary artery disease. Aortic Atherosclerosis (ICD10-I70.0). Electronically Signed   By: Eddie Candle M.D.   On: 05/15/2020 11:48    Impression/Plan: 1. 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 We will plan to proceed with surveillance CT in 6 weeks. Orders will be placed today for her next scan. She was given precautions to call if she has questions or concerns. 2. RML nodule. This will continue to be followed on subsequent imaging but did not require active treatment.  3. COPD. The patient will continue with Dr. Alcide Clever in [place] as well. 4. Low diastolic pressure. Today in clinic her BP was 119/51 despite several rechecks. She was not having any symptoms, and plans to check her BP at least 3 more  times today and follow up with her PCP by phone. We will follow this expectantly.     Carola Rhine, PAC

## 2020-06-09 ENCOUNTER — Encounter: Payer: Self-pay | Admitting: Radiation Oncology

## 2020-06-09 ENCOUNTER — Ambulatory Visit
Admission: RE | Admit: 2020-06-09 | Discharge: 2020-06-09 | Disposition: A | Discharge status: Home / Self Care | Payer: Medicare Other | Source: Ambulatory Visit | Attending: Radiation Oncology | Admitting: Radiation Oncology

## 2020-06-09 ENCOUNTER — Other Ambulatory Visit: Payer: Self-pay

## 2020-06-09 VITALS — BP 118/51 | HR 66 | Temp 98.0°F | Resp 20 | Wt 174.8 lb

## 2020-06-09 DIAGNOSIS — C3412 Malignant neoplasm of upper lobe, left bronchus or lung: Secondary | ICD-10-CM | POA: Diagnosis not present

## 2020-06-16 DIAGNOSIS — M1612 Unilateral primary osteoarthritis, left hip: Secondary | ICD-10-CM | POA: Diagnosis not present

## 2020-06-16 DIAGNOSIS — E890 Postprocedural hypothyroidism: Secondary | ICD-10-CM | POA: Diagnosis not present

## 2020-06-16 DIAGNOSIS — G4700 Insomnia, unspecified: Secondary | ICD-10-CM | POA: Diagnosis not present

## 2020-06-16 DIAGNOSIS — E785 Hyperlipidemia, unspecified: Secondary | ICD-10-CM | POA: Diagnosis not present

## 2020-06-16 DIAGNOSIS — F325 Major depressive disorder, single episode, in full remission: Secondary | ICD-10-CM | POA: Diagnosis not present

## 2020-06-16 DIAGNOSIS — I10 Essential (primary) hypertension: Secondary | ICD-10-CM | POA: Diagnosis not present

## 2020-06-16 DIAGNOSIS — I129 Hypertensive chronic kidney disease with stage 1 through stage 4 chronic kidney disease, or unspecified chronic kidney disease: Secondary | ICD-10-CM | POA: Diagnosis not present

## 2020-06-16 DIAGNOSIS — N183 Chronic kidney disease, stage 3 unspecified: Secondary | ICD-10-CM | POA: Diagnosis not present

## 2020-06-16 DIAGNOSIS — J449 Chronic obstructive pulmonary disease, unspecified: Secondary | ICD-10-CM | POA: Diagnosis not present

## 2020-06-16 DIAGNOSIS — M1611 Unilateral primary osteoarthritis, right hip: Secondary | ICD-10-CM | POA: Diagnosis not present

## 2020-07-01 NOTE — Progress Notes (Signed)
  Radiation Oncology         (336) [phone] ________________________________  Name: Holly [name] MRN: [id number]  Date: 06/09/2020  DOB: October 03, 1946  End of Treatment Note  Diagnosis:    NSCLC  Indication for treatment::  curative       Radiation treatment dates:   06/02/20 - 06/09/20  Site/dose:   The patient was treated to the left lung with a course of stereotactic body radiation treatment.  The patient received 54 Gray in 3 fractions using a SBRT/ IMRT technique, with 3 fields.  Narrative: The patient tolerated radiation treatment relatively well.   No unexpected difficulties.  The patient's breathing did not significantly change during the course of the treatment.  Plan: The patient has completed radiation treatment. The patient will return to radiation oncology clinic for routine followup in one month. I advised the patient to call or return sooner if they have any questions or concerns related to their recovery or treatment. ________________________________  Jodelle Gross, M.D., Ph.D.

## 2020-07-14 DIAGNOSIS — N183 Chronic kidney disease, stage 3 unspecified: Secondary | ICD-10-CM | POA: Diagnosis not present

## 2020-07-14 DIAGNOSIS — M1612 Unilateral primary osteoarthritis, left hip: Secondary | ICD-10-CM | POA: Diagnosis not present

## 2020-07-14 DIAGNOSIS — J9611 Chronic respiratory failure with hypoxia: Secondary | ICD-10-CM | POA: Diagnosis not present

## 2020-07-14 DIAGNOSIS — J449 Chronic obstructive pulmonary disease, unspecified: Secondary | ICD-10-CM | POA: Diagnosis not present

## 2020-07-14 DIAGNOSIS — M1611 Unilateral primary osteoarthritis, right hip: Secondary | ICD-10-CM | POA: Diagnosis not present

## 2020-07-14 DIAGNOSIS — E785 Hyperlipidemia, unspecified: Secondary | ICD-10-CM | POA: Diagnosis not present

## 2020-07-14 DIAGNOSIS — F325 Major depressive disorder, single episode, in full remission: Secondary | ICD-10-CM | POA: Diagnosis not present

## 2020-07-14 DIAGNOSIS — R7303 Prediabetes: Secondary | ICD-10-CM | POA: Diagnosis not present

## 2020-07-14 DIAGNOSIS — G4700 Insomnia, unspecified: Secondary | ICD-10-CM | POA: Diagnosis not present

## 2020-07-14 DIAGNOSIS — I129 Hypertensive chronic kidney disease with stage 1 through stage 4 chronic kidney disease, or unspecified chronic kidney disease: Secondary | ICD-10-CM | POA: Diagnosis not present

## 2020-07-14 DIAGNOSIS — E890 Postprocedural hypothyroidism: Secondary | ICD-10-CM | POA: Diagnosis not present

## 2020-07-14 DIAGNOSIS — I10 Essential (primary) hypertension: Secondary | ICD-10-CM | POA: Diagnosis not present

## 2020-07-14 DIAGNOSIS — C3490 Malignant neoplasm of unspecified part of unspecified bronchus or lung: Secondary | ICD-10-CM | POA: Diagnosis not present

## 2020-07-14 DIAGNOSIS — L89010 Pressure ulcer of right elbow, unstageable: Secondary | ICD-10-CM | POA: Diagnosis not present

## 2020-07-17 ENCOUNTER — Telehealth: Payer: Self-pay | Admitting: *Deleted

## 2020-07-17 NOTE — Telephone Encounter (Signed)
CALLED PATIENT TO ASK ABOUT COMING IN FOR STAT LABS ON 07-21-20 @ 3 PM [facility], PATIENT AGREED TO DO SO.

## 2020-07-18 DIAGNOSIS — S51001A Unspecified open wound of right elbow, initial encounter: Secondary | ICD-10-CM | POA: Diagnosis not present

## 2020-07-21 ENCOUNTER — Other Ambulatory Visit: Payer: Self-pay

## 2020-07-21 ENCOUNTER — Ambulatory Visit
Admission: RE | Admit: 2020-07-21 | Discharge: 2020-07-21 | Disposition: A | Discharge status: Home / Self Care | Payer: Medicare Other | Source: Ambulatory Visit | Attending: Radiation Oncology | Admitting: Radiation Oncology

## 2020-07-21 ENCOUNTER — Ambulatory Visit (HOSPITAL_COMMUNITY)
Admission: RE | Admit: 2020-07-21 | Discharge: 2020-07-21 | Disposition: A | Discharge status: Home / Self Care | Payer: Medicare Other | Source: Ambulatory Visit | Attending: Radiation Oncology | Admitting: Radiation Oncology

## 2020-07-21 DIAGNOSIS — M25521 Pain in right elbow: Secondary | ICD-10-CM | POA: Diagnosis not present

## 2020-07-21 DIAGNOSIS — C3412 Malignant neoplasm of upper lobe, left bronchus or lung: Secondary | ICD-10-CM | POA: Insufficient documentation

## 2020-07-21 DIAGNOSIS — I700 Atherosclerosis of aorta: Secondary | ICD-10-CM | POA: Diagnosis not present

## 2020-07-21 DIAGNOSIS — I2510 Atherosclerotic heart disease of native coronary artery without angina pectoris: Secondary | ICD-10-CM | POA: Diagnosis not present

## 2020-07-21 DIAGNOSIS — J984 Other disorders of lung: Secondary | ICD-10-CM | POA: Diagnosis not present

## 2020-07-21 DIAGNOSIS — M71021 Abscess of bursa, right elbow: Secondary | ICD-10-CM | POA: Diagnosis not present

## 2020-07-21 DIAGNOSIS — C3490 Malignant neoplasm of unspecified part of unspecified bronchus or lung: Secondary | ICD-10-CM | POA: Diagnosis not present

## 2020-07-21 LAB — BUN & CREATININE (CHCC)
BUN: 15 mg/dL (ref 8–23)
Creatinine: 0.91 mg/dL (ref 0.44–1.00)
GFR, Estimated: >60 mL/min (ref >60)

## 2020-07-21 IMAGING — CT CT CHEST W/ CM
2 of 3 series · 15 of 36 positions shown, 18 images · IV contrast (omnipaque)
Comparison: [DATE].  CT abdomen pelvis [DATE]

CLINICAL DATA: Lung cancer, restaging following radiation therapy.

EXAM:
CT CHEST WITH CONTRAST
TECHNIQUE: Multidetector CT imaging of the chest was performed during
intravenous contrast administration.
CONTRAST:  75mL OMNIPAQUE IOHEXOL 300 MG/ML  SOLN

[Series 2: axial st · axial · 0.89mm/px · z∈[-479,-225]mm · 12 of 151 slices shown, 15 images]
[im 12/151  mediastinal]
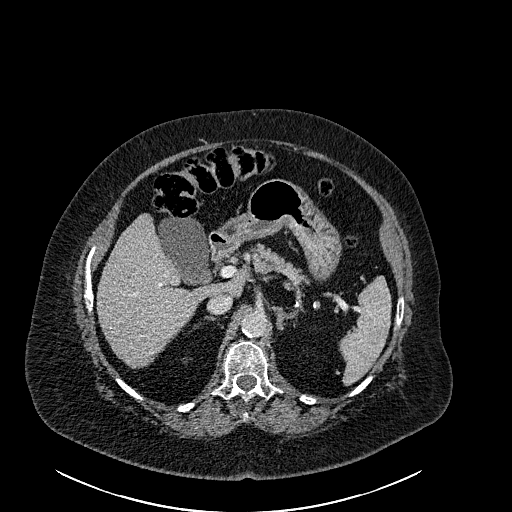
[im 12/151  lung]
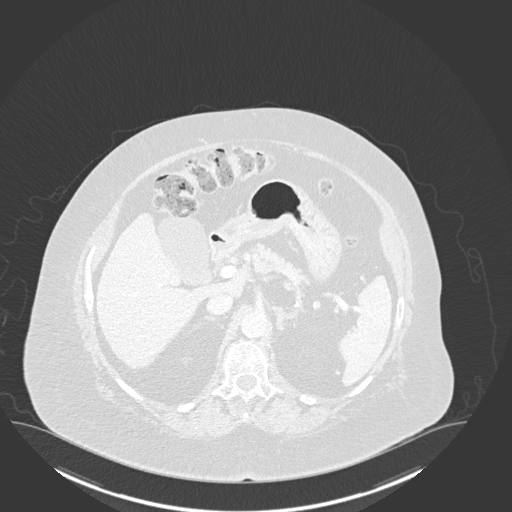
[im 23/151  lung]
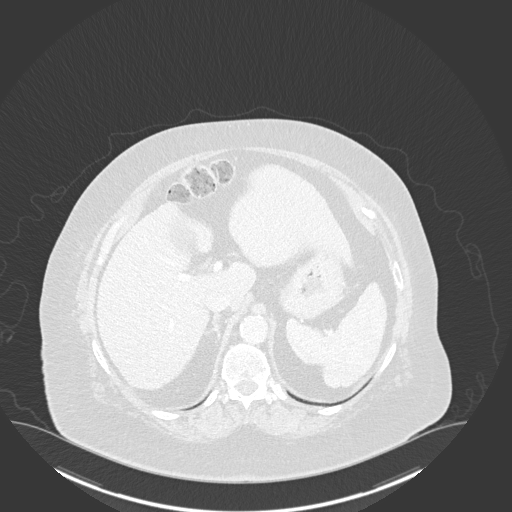
[im 34/151  lung]
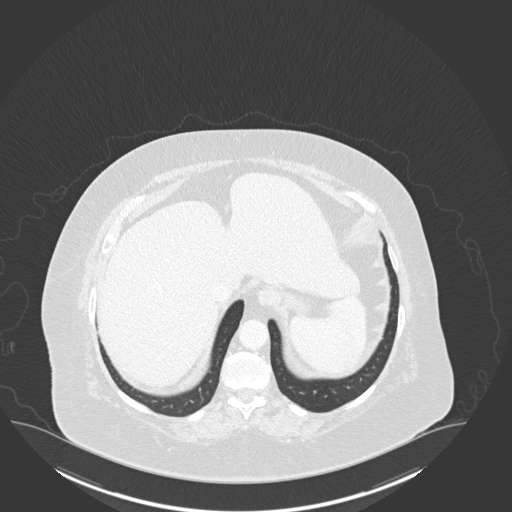
[im 45/151  lung]
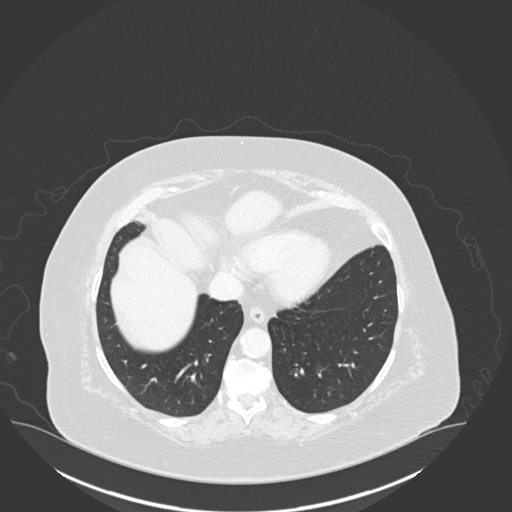
[im 56/151  mediastinal]
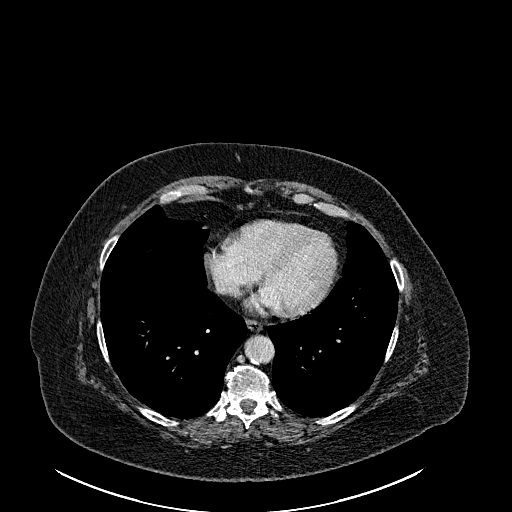
[im 56/151  lung]
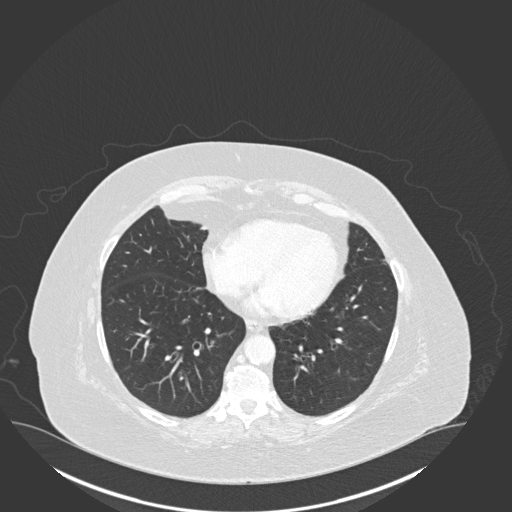
[im 67/151  lung]
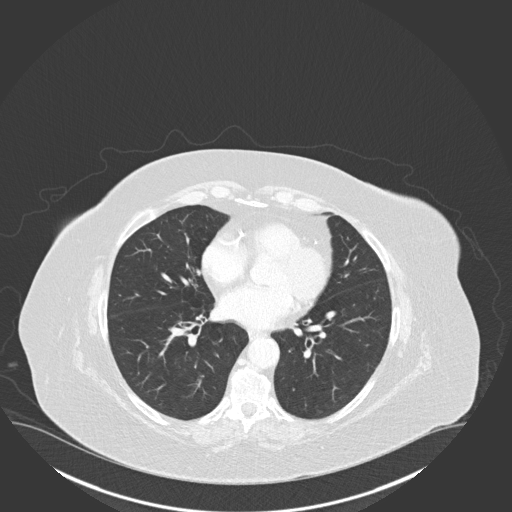
[im 84/151  lung]
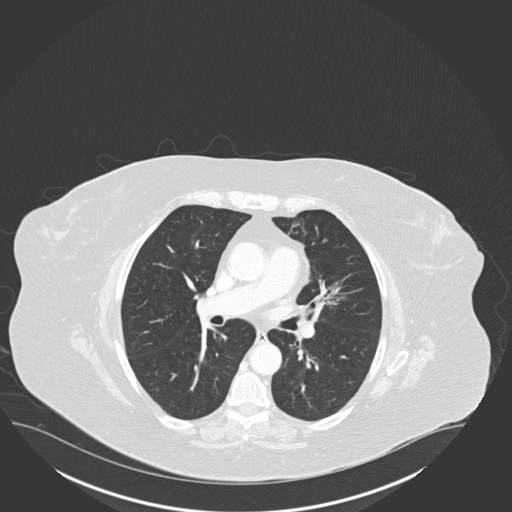
[im 95/151  lung]
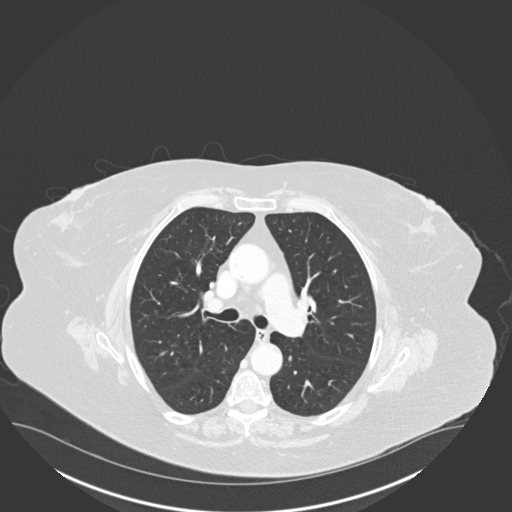
[im 106/151  mediastinal]
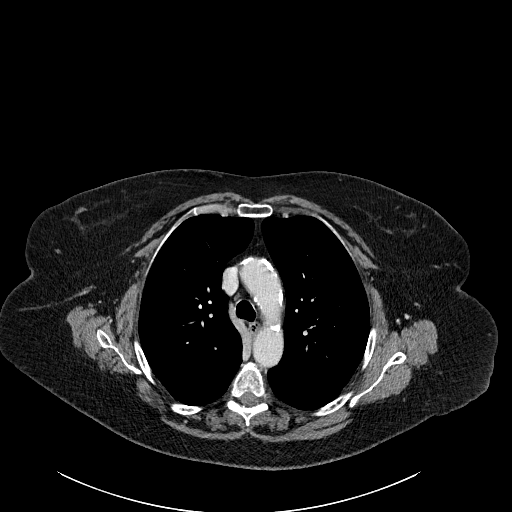
[im 106/151  lung]
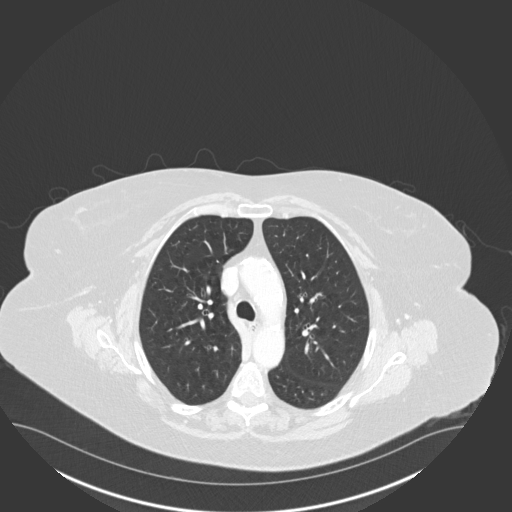
[im 117/151  lung]
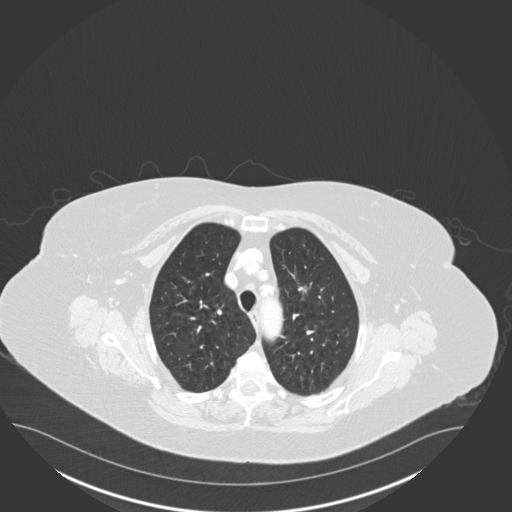
[im 128/151  lung]
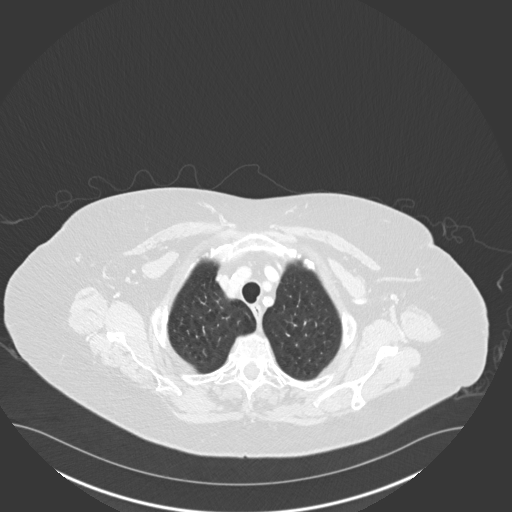
[im 139/151  lung]
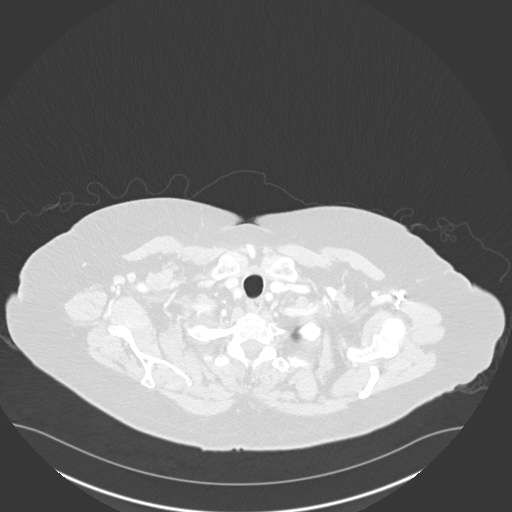

[Series 5: coronal · coronal · 0.61mm/px · 3 of 155 slices shown]
[im 31/155  lung]
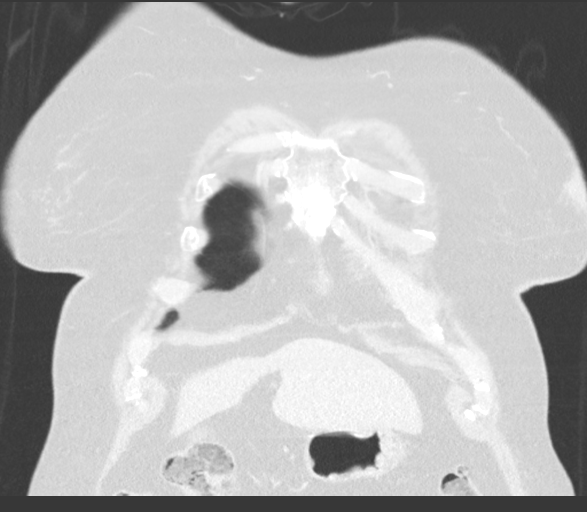
[im 62/155  lung]
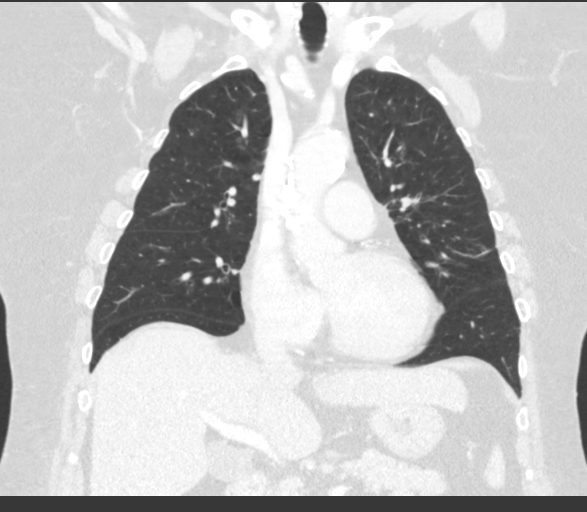
[im 93/155  lung]
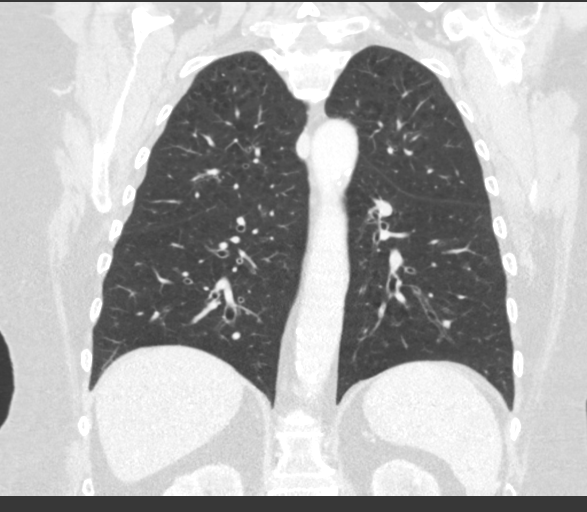

[15 of 36 positions shown; findings below may reference images not displayed]

FINDINGS: Cardiovascular: Atherosclerotic calcification of the aorta, aortic
valve and coronary arteries. Heart size normal. No pericardial
effusion.

Mediastinum/Nodes: Thyroidectomy. No pathologically enlarged
mediastinal, hilar or axillary lymph nodes. Esophagus is grossly
unremarkable.

Lungs/Pleura: Centrilobular emphysema. Spiculated lateral segment
right middle lobe nodule measures 9 x 11 mm (7/77), stable from
[DATE]. Residual thin walled cystic nodule in the left upper
lobe measures 9 mm (8 x 10 mm, 7/37), slightly decreased in size
from 11 mm on [DATE]. No residual solid components. Left hilar
soft tissue thickening with architectural distortion in the adjacent
left upper lobe, similar. No pleural fluid. Debris is seen in the
airway.

Upper Abdomen: Visualized portions of the liver, gallbladder and
right adrenal gland are unremarkable. 1.5 cm nodule in the lateral
limb left adrenal gland, as on [DATE]. Visualized portions of
the kidneys, spleen, pancreas, stomach and bowel are grossly
unremarkable. No upper abdominal adenopathy.

Musculoskeletal: Degenerative changes in the spine. Old rib
fractures. No worrisome lytic or sclerotic lesions.
IMPRESSION: 1. Residual thin walled cystic lesion in the left upper lobe,
slightly decreased in size from [DATE]. No residual solid
components.
2. Spiculated right middle lobe nodule, unchanged and worrisome for
adenocarcinoma.
3. Post treatment scarring in the left hilum and adjacent left upper
lobe.
4. Chronically stable left adrenal nodule, consistent with an
adenoma.
5. Aortic atherosclerosis ([21]-[21]). Coronary artery
calcification.
6.  Emphysema ([21]-[21]).

## 2020-07-21 MED ORDER — IOHEXOL 300 MG/ML  SOLN
75.0000 mL | Freq: Once | INTRAMUSCULAR | Status: AC | PRN
  Administered 2020-07-21: 75 mL via INTRAVENOUS

## 2020-07-22 ENCOUNTER — Telehealth: Payer: Self-pay | Admitting: Radiation Oncology

## 2020-07-22 DIAGNOSIS — M25521 Pain in right elbow: Secondary | ICD-10-CM | POA: Diagnosis not present

## 2020-07-22 DIAGNOSIS — C3412 Malignant neoplasm of upper lobe, left bronchus or lung: Secondary | ICD-10-CM

## 2020-07-22 NOTE — Telephone Encounter (Signed)
See prior notes from today

## 2020-07-22 NOTE — Telephone Encounter (Signed)
I called and had to leave a message for the patient to call me back to review her results of her CT scan her Dr. Ida Rogue recommendation to follow-up with repeat imaging in 4 months time just to continue to follow the right middle lobe nodule which to some degree has waxed and waned on previous scans but has remained overall stable.  If everything appears stable at that point in time Dr. Lisbeth Renshaw favors moving back to the 2-month schedule for follow-up.  I asked the patient to call if she had any questions or concerns or would like to discuss this further.  Orders were placed for her next scan to be approximately February 2022.

## 2020-07-24 NOTE — Patient Instructions (Signed)
DUE TO COVID-19 ONLY ONE VISITOR IS ALLOWED TO COME WITH YOU AND STAY IN THE WAITING ROOM ONLY DURING PRE OP AND PROCEDURE DAY OF SURGERY. THE 1 VISITOR  MAY VISIT WITH YOU AFTER SURGERY IN YOUR PRIVATE ROOM DURING VISITING HOURS ONLY!  YOU NEED TO HAVE A COVID 19 TEST ON_10/11______ @_______ , THIS TEST MUST BE DONE BEFORE SURGERY,  COVID TESTING SITE [address] [place] [address], IT IS ON THE RIGHT GOING OUT [address] APPROXIMATELY  2 MINUTES PAST [employer] ON THE RIGHT. ONCE YOUR COVID TEST IS COMPLETED,  PLEASE BEGIN THE QUARANTINE INSTRUCTIONS AS OUTLINED IN YOUR HANDOUT.                Holly [name]    Your procedure is scheduled on: 07/29/20   Report to [facility] Main  Entrance   Report to admitting at  6:30 AM     Call this number if you have problems the morning of surgery [phone]    Remember: Do not eat food or drink liquids :After Midnight.   BRUSH YOUR TEETH MORNING OF SURGERY AND RINSE YOUR MOUTH OUT, NO CHEWING GUM CANDY OR MINTS.   No food after midnight.  You may have clear liquid until 4:30 AM.  At 4:30 AM drink pre surgery drink. Nothing by mouth after 4:30 AM.   Take these medicines the morning of surgery with A SIP OF WATER:                                  You may not have any metal on your body including hair pins and              piercings  Do not wear jewelry, make-up, lotions, powders or perfumes, deodorant             Do not wear nail polish on your fingernails.  Do not shave  48 hours prior to surgery.               Do not bring valuables to the [name] [place].  Contacts, dentures or bridgework may not be worn into surgery.       Patients discharged the day of surgery will not be allowed to drive home. IF YOU ARE HAVING SURGERY AND GOING HOME THE SAME DAY, YOU MUST HAVE AN ADULT TO DRIVE YOU HOME AND BE WITH YOU FOR 24 HOURS. YOU MAY GO HOME BY TAXI OR  UBER OR ORTHERWISE, BUT AN ADULT MUST ACCOMPANY YOU HOME AND STAY WITH YOU FOR 24 HOURS.  Name and phone number of your driver:  Special Instructions: N/A              Please read over the following fact sheets you were given: _____________________________________________________________________             [name] - Preparing for Surgery Before surgery, you can play an important role.  Because skin is not sterile, your skin needs to be as free of germs as possible.  You can reduce the number of germs on your skin by washing with CHG (chlorahexidine gluconate) soap before surgery.  CHG is an antiseptic cleaner which kills germs and bonds with the skin to continue killing germs even after washing. Please DO NOT use if you have  an allergy to CHG or antibacterial soaps.  If your skin becomes reddened/irritated stop using the CHG and inform your nurse when you arrive at Short Stay. Do not shave (including legs and underarms) for at least 48 hours prior to the first CHG shower. Please follow these instructions carefully:  1.  Shower with CHG Soap the night before surgery and the  morning of Surgery.  2.  If you choose to wash your hair, wash your hair first as usual with your  normal  shampoo.  3.  After you shampoo, rinse your hair and body thoroughly to remove the  shampoo.                                        4.  Use CHG as you would any other liquid soap.  You can apply chg directly  to the skin and wash                       Gently with a scrungie or clean washcloth.  5.  Apply the CHG Soap to your body ONLY FROM THE NECK DOWN.   Do not use on face/ open                           Wound or open sores. Avoid contact with eyes, ears mouth and genitals (private parts).                       Wash face,  Genitals (private parts) with your normal soap.             6.  Wash thoroughly, paying special attention to the area where your surgery  will be performed.  7.  Thoroughly rinse your body  with warm water from the neck down.  8.  DO NOT shower/wash with your normal soap after using and rinsing off  the CHG Soap.             9.  Pat yourself dry with a clean towel.            10.  Wear clean pajamas.            11.  Place clean sheets on your bed the night of your first shower and do not  sleep with pets. Day of Surgery : Do not apply any lotions/deodorants the morning of surgery.  Please wear clean clothes to the [name]/surgery center.  FAILURE TO FOLLOW THESE INSTRUCTIONS MAY RESULT IN THE CANCELLATION OF YOUR SURGERY PATIENT SIGNATURE_________________________________  NURSE SIGNATURE__________________________________  ________________________________________________________________________   H[name]  An incentive spirometer is a tool that can help keep your lungs clear and active. This tool measures how well you are filling your lungs with each breath. Taking long deep breaths may help reverse or decrease the chance of developing breathing (pulmonary) problems (especially infection) following:  A long period of time when you are unable to move or be active. BEFORE THE PROCEDURE   If the spirometer includes an indicator to show your best effort, your nurse or respiratory therapist will set it to a desired goal.  If possible, sit up straight or lean slightly forward. Try not to slouch.  Hold the incentive spirometer in an upright position. INSTRUCTIONS FOR USE  1. Sit on the edge of your bed if possible, or  sit up as far as you can in bed or on a chair. 2. Hold the incentive spirometer in an upright position. 3. Breathe out normally. 4. Place the mouthpiece in your mouth and seal your lips tightly around it. 5. Breathe in slowly and as deeply as possible, raising the piston or the ball toward the top of the column. 6. Hold your breath for 3-5 seconds or for as long as possible. Allow the piston or ball to fall to the bottom of the column. 7. Remove the  mouthpiece from your mouth and breathe out normally. 8. Rest for a few seconds and repeat Steps 1 through 7 at least 10 times every 1-2 hours when you are awake. Take your time and take a few normal breaths between deep breaths. 9. The spirometer may include an indicator to show your best effort. Use the indicator as a goal to work toward during each repetition. 10. After each set of 10 deep breaths, practice coughing to be sure your lungs are clear. If you have an incision (the cut made at the time of surgery), support your incision when coughing by placing a pillow or rolled up towels firmly against it. Once you are able to get out of bed, walk around indoors and cough well. You may stop using the incentive spirometer when instructed by your caregiver.  RISKS AND COMPLICATIONS  Take your time so you do not get dizzy or light-headed.  If you are in pain, you may need to take or ask for pain medication before doing incentive spirometry. It is harder to take a deep breath if you are having pain. AFTER USE  Rest and breathe slowly and easily.  It can be helpful to keep track of a log of your progress. Your caregiver can provide you with a simple table to help with this. If you are using the spirometer at home, follow these instructions: [place] IF:   You are having difficultly using the spirometer.  You have trouble using the spirometer as often as instructed.  Your pain medication is not giving enough relief while using the spirometer.  You develop fever of 100.5 F (38.1 C) or higher. SEEK IMMEDIATE MEDICAL CARE IF:   You cough up bloody sputum that had not been present before.  You develop fever of 102 F (38.9 C) or greater.  You develop worsening pain at or near the incision site. MAKE SURE YOU:   Understand these instructions.  Will watch your condition.  Will get help right away if you are not doing well or get worse. Document Released: 02/13/2007 Document  Revised: 12/26/2011 Document Reviewed: 04/16/2007 [name] Patient Information 2014 [place], [place].   ________________________________________________________________________

## 2020-07-25 ENCOUNTER — Other Ambulatory Visit (HOSPITAL_COMMUNITY): Payer: Medicare Other

## 2020-07-27 ENCOUNTER — Ambulatory Visit: Payer: Self-pay | Admitting: Radiation Oncology

## 2020-07-27 ENCOUNTER — Other Ambulatory Visit (HOSPITAL_COMMUNITY): Payer: Medicare Other

## 2020-07-27 DIAGNOSIS — M25521 Pain in right elbow: Secondary | ICD-10-CM | POA: Diagnosis not present

## 2020-07-28 ENCOUNTER — Encounter (HOSPITAL_COMMUNITY)
Admission: RE | Admit: 2020-07-28 | Discharge: 2020-07-28 | Disposition: A | Discharge status: Home / Self Care | Payer: Medicare Other | Source: Ambulatory Visit | Attending: Family Medicine | Admitting: Family Medicine

## 2020-07-28 ENCOUNTER — Ambulatory Visit: Payer: Medicare Other | Admitting: Radiation Oncology

## 2020-07-29 ENCOUNTER — Ambulatory Visit (HOSPITAL_COMMUNITY): Admission: RE | Admit: 2020-07-29 | Payer: Medicare Other | Source: Home / Self Care | Admitting: Orthopedic Surgery

## 2020-07-29 ENCOUNTER — Encounter (HOSPITAL_COMMUNITY): Admission: RE | Payer: Self-pay | Source: Home / Self Care

## 2020-07-29 SURGERY — BURSECTOMY, ELBOW
Anesthesia: Choice | Site: Elbow | Laterality: Right

## 2020-08-03 DIAGNOSIS — M25521 Pain in right elbow: Secondary | ICD-10-CM | POA: Diagnosis not present

## 2020-08-05 DIAGNOSIS — F411 Generalized anxiety disorder: Secondary | ICD-10-CM | POA: Diagnosis not present

## 2020-08-05 DIAGNOSIS — R5383 Other fatigue: Secondary | ICD-10-CM | POA: Diagnosis not present

## 2020-08-05 DIAGNOSIS — Z23 Encounter for immunization: Secondary | ICD-10-CM | POA: Diagnosis not present

## 2020-08-05 DIAGNOSIS — J449 Chronic obstructive pulmonary disease, unspecified: Secondary | ICD-10-CM | POA: Diagnosis not present

## 2020-08-05 DIAGNOSIS — G4733 Obstructive sleep apnea (adult) (pediatric): Secondary | ICD-10-CM | POA: Diagnosis not present

## 2020-08-05 DIAGNOSIS — R918 Other nonspecific abnormal finding of lung field: Secondary | ICD-10-CM | POA: Diagnosis not present

## 2020-08-05 DIAGNOSIS — J9611 Chronic respiratory failure with hypoxia: Secondary | ICD-10-CM | POA: Diagnosis not present

## 2020-08-06 DIAGNOSIS — I129 Hypertensive chronic kidney disease with stage 1 through stage 4 chronic kidney disease, or unspecified chronic kidney disease: Secondary | ICD-10-CM | POA: Diagnosis not present

## 2020-08-06 DIAGNOSIS — E785 Hyperlipidemia, unspecified: Secondary | ICD-10-CM | POA: Diagnosis not present

## 2020-08-06 DIAGNOSIS — M1612 Unilateral primary osteoarthritis, left hip: Secondary | ICD-10-CM | POA: Diagnosis not present

## 2020-08-06 DIAGNOSIS — C3490 Malignant neoplasm of unspecified part of unspecified bronchus or lung: Secondary | ICD-10-CM | POA: Diagnosis not present

## 2020-08-06 DIAGNOSIS — J449 Chronic obstructive pulmonary disease, unspecified: Secondary | ICD-10-CM | POA: Diagnosis not present

## 2020-08-06 DIAGNOSIS — F325 Major depressive disorder, single episode, in full remission: Secondary | ICD-10-CM | POA: Diagnosis not present

## 2020-08-06 DIAGNOSIS — I10 Essential (primary) hypertension: Secondary | ICD-10-CM | POA: Diagnosis not present

## 2020-08-06 DIAGNOSIS — E890 Postprocedural hypothyroidism: Secondary | ICD-10-CM | POA: Diagnosis not present

## 2020-08-06 DIAGNOSIS — N183 Chronic kidney disease, stage 3 unspecified: Secondary | ICD-10-CM | POA: Diagnosis not present

## 2020-08-06 DIAGNOSIS — M1611 Unilateral primary osteoarthritis, right hip: Secondary | ICD-10-CM | POA: Diagnosis not present

## 2020-08-06 DIAGNOSIS — G4700 Insomnia, unspecified: Secondary | ICD-10-CM | POA: Diagnosis not present

## 2020-08-11 DIAGNOSIS — N183 Chronic kidney disease, stage 3 unspecified: Secondary | ICD-10-CM | POA: Diagnosis not present

## 2020-08-11 DIAGNOSIS — I129 Hypertensive chronic kidney disease with stage 1 through stage 4 chronic kidney disease, or unspecified chronic kidney disease: Secondary | ICD-10-CM | POA: Diagnosis not present

## 2020-08-11 DIAGNOSIS — R7303 Prediabetes: Secondary | ICD-10-CM | POA: Diagnosis not present

## 2020-08-11 DIAGNOSIS — Z23 Encounter for immunization: Secondary | ICD-10-CM | POA: Diagnosis not present

## 2020-08-11 DIAGNOSIS — E890 Postprocedural hypothyroidism: Secondary | ICD-10-CM | POA: Diagnosis not present

## 2020-08-25 DIAGNOSIS — J9611 Chronic respiratory failure with hypoxia: Secondary | ICD-10-CM | POA: Diagnosis not present

## 2020-08-25 DIAGNOSIS — G4733 Obstructive sleep apnea (adult) (pediatric): Secondary | ICD-10-CM | POA: Diagnosis not present

## 2020-08-25 DIAGNOSIS — R918 Other nonspecific abnormal finding of lung field: Secondary | ICD-10-CM | POA: Diagnosis not present

## 2020-08-25 DIAGNOSIS — J449 Chronic obstructive pulmonary disease, unspecified: Secondary | ICD-10-CM | POA: Diagnosis not present

## 2020-08-25 DIAGNOSIS — F411 Generalized anxiety disorder: Secondary | ICD-10-CM | POA: Diagnosis not present

## 2020-08-25 DIAGNOSIS — Z79899 Other long term (current) drug therapy: Secondary | ICD-10-CM | POA: Diagnosis not present

## 2020-08-25 DIAGNOSIS — R5383 Other fatigue: Secondary | ICD-10-CM | POA: Diagnosis not present

## 2020-08-26 DIAGNOSIS — M1611 Unilateral primary osteoarthritis, right hip: Secondary | ICD-10-CM | POA: Diagnosis not present

## 2020-08-26 DIAGNOSIS — F325 Major depressive disorder, single episode, in full remission: Secondary | ICD-10-CM | POA: Diagnosis not present

## 2020-08-26 DIAGNOSIS — E785 Hyperlipidemia, unspecified: Secondary | ICD-10-CM | POA: Diagnosis not present

## 2020-08-26 DIAGNOSIS — N183 Chronic kidney disease, stage 3 unspecified: Secondary | ICD-10-CM | POA: Diagnosis not present

## 2020-08-26 DIAGNOSIS — E890 Postprocedural hypothyroidism: Secondary | ICD-10-CM | POA: Diagnosis not present

## 2020-08-26 DIAGNOSIS — G4700 Insomnia, unspecified: Secondary | ICD-10-CM | POA: Diagnosis not present

## 2020-08-26 DIAGNOSIS — J449 Chronic obstructive pulmonary disease, unspecified: Secondary | ICD-10-CM | POA: Diagnosis not present

## 2020-08-26 DIAGNOSIS — I129 Hypertensive chronic kidney disease with stage 1 through stage 4 chronic kidney disease, or unspecified chronic kidney disease: Secondary | ICD-10-CM | POA: Diagnosis not present

## 2020-08-26 DIAGNOSIS — I10 Essential (primary) hypertension: Secondary | ICD-10-CM | POA: Diagnosis not present

## 2020-08-26 DIAGNOSIS — M1612 Unilateral primary osteoarthritis, left hip: Secondary | ICD-10-CM | POA: Diagnosis not present

## 2020-08-26 DIAGNOSIS — C3490 Malignant neoplasm of unspecified part of unspecified bronchus or lung: Secondary | ICD-10-CM | POA: Diagnosis not present

## 2020-09-08 DIAGNOSIS — F411 Generalized anxiety disorder: Secondary | ICD-10-CM | POA: Diagnosis not present

## 2020-09-08 DIAGNOSIS — J449 Chronic obstructive pulmonary disease, unspecified: Secondary | ICD-10-CM | POA: Diagnosis not present

## 2020-09-08 DIAGNOSIS — Z03818 Encounter for observation for suspected exposure to other biological agents ruled out: Secondary | ICD-10-CM | POA: Diagnosis not present

## 2020-09-08 DIAGNOSIS — J9611 Chronic respiratory failure with hypoxia: Secondary | ICD-10-CM | POA: Diagnosis not present

## 2020-09-08 DIAGNOSIS — R5383 Other fatigue: Secondary | ICD-10-CM | POA: Diagnosis not present

## 2020-09-08 DIAGNOSIS — R918 Other nonspecific abnormal finding of lung field: Secondary | ICD-10-CM | POA: Diagnosis not present

## 2020-09-08 DIAGNOSIS — G4733 Obstructive sleep apnea (adult) (pediatric): Secondary | ICD-10-CM | POA: Diagnosis not present

## 2020-09-09 DIAGNOSIS — R531 Weakness: Secondary | ICD-10-CM | POA: Diagnosis not present

## 2020-09-09 DIAGNOSIS — M47817 Spondylosis without myelopathy or radiculopathy, lumbosacral region: Secondary | ICD-10-CM | POA: Diagnosis not present

## 2020-09-09 DIAGNOSIS — J439 Emphysema, unspecified: Secondary | ICD-10-CM | POA: Diagnosis not present

## 2020-09-09 DIAGNOSIS — M47816 Spondylosis without myelopathy or radiculopathy, lumbar region: Secondary | ICD-10-CM | POA: Diagnosis not present

## 2020-09-09 DIAGNOSIS — R42 Dizziness and giddiness: Secondary | ICD-10-CM | POA: Diagnosis not present

## 2020-09-09 DIAGNOSIS — B379 Candidiasis, unspecified: Secondary | ICD-10-CM | POA: Diagnosis not present

## 2020-09-09 DIAGNOSIS — J189 Pneumonia, unspecified organism: Secondary | ICD-10-CM | POA: Diagnosis not present

## 2020-09-09 DIAGNOSIS — M4316 Spondylolisthesis, lumbar region: Secondary | ICD-10-CM | POA: Diagnosis not present

## 2020-09-15 DIAGNOSIS — M25521 Pain in right elbow: Secondary | ICD-10-CM | POA: Diagnosis not present

## 2020-09-16 DIAGNOSIS — R410 Disorientation, unspecified: Secondary | ICD-10-CM | POA: Diagnosis not present

## 2020-09-16 DIAGNOSIS — E890 Postprocedural hypothyroidism: Secondary | ICD-10-CM | POA: Diagnosis not present

## 2020-09-16 DIAGNOSIS — R419 Unspecified symptoms and signs involving cognitive functions and awareness: Secondary | ICD-10-CM | POA: Diagnosis not present

## 2020-09-16 DIAGNOSIS — L89013 Pressure ulcer of right elbow, stage 3: Secondary | ICD-10-CM | POA: Diagnosis not present

## 2020-09-16 DIAGNOSIS — J449 Chronic obstructive pulmonary disease, unspecified: Secondary | ICD-10-CM | POA: Diagnosis not present

## 2020-09-16 DIAGNOSIS — D649 Anemia, unspecified: Secondary | ICD-10-CM | POA: Diagnosis not present

## 2020-09-16 DIAGNOSIS — J9611 Chronic respiratory failure with hypoxia: Secondary | ICD-10-CM | POA: Diagnosis not present

## 2020-09-19 DIAGNOSIS — M7021 Olecranon bursitis, right elbow: Secondary | ICD-10-CM | POA: Diagnosis not present

## 2020-09-21 ENCOUNTER — Other Ambulatory Visit: Payer: Self-pay | Admitting: Family Medicine

## 2020-09-21 DIAGNOSIS — R410 Disorientation, unspecified: Secondary | ICD-10-CM

## 2020-09-21 DIAGNOSIS — M25521 Pain in right elbow: Secondary | ICD-10-CM | POA: Diagnosis not present

## 2020-09-22 ENCOUNTER — Ambulatory Visit: Payer: Self-pay | Admitting: Student

## 2020-09-22 DIAGNOSIS — S52021D Displaced fracture of olecranon process without intraarticular extension of right ulna, subsequent encounter for closed fracture with routine healing: Secondary | ICD-10-CM

## 2020-09-28 ENCOUNTER — Other Ambulatory Visit (HOSPITAL_COMMUNITY)
Admission: RE | Admit: 2020-09-28 | Discharge: 2020-09-28 | Disposition: A | Discharge status: Home / Self Care | Payer: Medicare Other | Source: Ambulatory Visit | Attending: Student | Admitting: Student

## 2020-09-28 DIAGNOSIS — Z01812 Encounter for preprocedural laboratory examination: Secondary | ICD-10-CM | POA: Insufficient documentation

## 2020-09-28 DIAGNOSIS — Z20822 Contact with and (suspected) exposure to covid-19: Secondary | ICD-10-CM | POA: Insufficient documentation

## 2020-09-29 DIAGNOSIS — E890 Postprocedural hypothyroidism: Secondary | ICD-10-CM | POA: Diagnosis not present

## 2020-09-29 DIAGNOSIS — R319 Hematuria, unspecified: Secondary | ICD-10-CM | POA: Diagnosis present

## 2020-09-29 DIAGNOSIS — J9622 Acute and chronic respiratory failure with hypercapnia: Secondary | ICD-10-CM | POA: Diagnosis present

## 2020-09-29 DIAGNOSIS — R0902 Hypoxemia: Secondary | ICD-10-CM | POA: Diagnosis not present

## 2020-09-29 DIAGNOSIS — R410 Disorientation, unspecified: Secondary | ICD-10-CM | POA: Diagnosis not present

## 2020-09-29 DIAGNOSIS — Z79899 Other long term (current) drug therapy: Secondary | ICD-10-CM | POA: Diagnosis not present

## 2020-09-29 DIAGNOSIS — E860 Dehydration: Secondary | ICD-10-CM | POA: Diagnosis not present

## 2020-09-29 DIAGNOSIS — B9562 Methicillin resistant Staphylococcus aureus infection as the cause of diseases classified elsewhere: Secondary | ICD-10-CM | POA: Diagnosis present

## 2020-09-29 DIAGNOSIS — Z6831 Body mass index (BMI) 31.0-31.9, adult: Secondary | ICD-10-CM | POA: Diagnosis not present

## 2020-09-29 DIAGNOSIS — D3502 Benign neoplasm of left adrenal gland: Secondary | ICD-10-CM | POA: Diagnosis not present

## 2020-09-29 DIAGNOSIS — J9621 Acute and chronic respiratory failure with hypoxia: Secondary | ICD-10-CM | POA: Diagnosis present

## 2020-09-29 DIAGNOSIS — Z7982 Long term (current) use of aspirin: Secondary | ICD-10-CM | POA: Diagnosis not present

## 2020-09-29 DIAGNOSIS — M25552 Pain in left hip: Secondary | ICD-10-CM | POA: Diagnosis not present

## 2020-09-29 DIAGNOSIS — M7989 Other specified soft tissue disorders: Secondary | ICD-10-CM | POA: Diagnosis not present

## 2020-09-29 DIAGNOSIS — R188 Other ascites: Secondary | ICD-10-CM | POA: Diagnosis not present

## 2020-09-29 DIAGNOSIS — R600 Localized edema: Secondary | ICD-10-CM | POA: Diagnosis not present

## 2020-09-29 DIAGNOSIS — M71121 Other infective bursitis, right elbow: Secondary | ICD-10-CM | POA: Diagnosis not present

## 2020-09-29 DIAGNOSIS — E039 Hypothyroidism, unspecified: Secondary | ICD-10-CM | POA: Diagnosis present

## 2020-09-29 DIAGNOSIS — Z9981 Dependence on supplemental oxygen: Secondary | ICD-10-CM | POA: Diagnosis not present

## 2020-09-29 DIAGNOSIS — E782 Mixed hyperlipidemia: Secondary | ICD-10-CM | POA: Diagnosis not present

## 2020-09-29 DIAGNOSIS — Z471 Aftercare following joint replacement surgery: Secondary | ICD-10-CM | POA: Diagnosis not present

## 2020-09-29 DIAGNOSIS — Z20822 Contact with and (suspected) exposure to covid-19: Secondary | ICD-10-CM | POA: Diagnosis not present

## 2020-09-29 DIAGNOSIS — J440 Chronic obstructive pulmonary disease with acute lower respiratory infection: Secondary | ICD-10-CM | POA: Diagnosis present

## 2020-09-29 DIAGNOSIS — J189 Pneumonia, unspecified organism: Secondary | ICD-10-CM | POA: Diagnosis present

## 2020-09-29 DIAGNOSIS — Z87891 Personal history of nicotine dependence: Secondary | ICD-10-CM | POA: Diagnosis not present

## 2020-09-29 DIAGNOSIS — T8459XA Infection and inflammatory reaction due to other internal joint prosthesis, initial encounter: Secondary | ICD-10-CM | POA: Diagnosis present

## 2020-09-29 DIAGNOSIS — Z85118 Personal history of other malignant neoplasm of bronchus and lung: Secondary | ICD-10-CM | POA: Diagnosis not present

## 2020-09-29 DIAGNOSIS — F32A Depression, unspecified: Secondary | ICD-10-CM | POA: Diagnosis present

## 2020-09-29 DIAGNOSIS — J9602 Acute respiratory failure with hypercapnia: Secondary | ICD-10-CM | POA: Diagnosis not present

## 2020-09-29 DIAGNOSIS — R4182 Altered mental status, unspecified: Secondary | ICD-10-CM | POA: Diagnosis not present

## 2020-09-29 DIAGNOSIS — S51001A Unspecified open wound of right elbow, initial encounter: Secondary | ICD-10-CM | POA: Diagnosis not present

## 2020-09-29 DIAGNOSIS — Z85820 Personal history of malignant melanoma of skin: Secondary | ICD-10-CM | POA: Diagnosis not present

## 2020-09-29 DIAGNOSIS — B9689 Other specified bacterial agents as the cause of diseases classified elsewhere: Secondary | ICD-10-CM | POA: Diagnosis not present

## 2020-09-29 DIAGNOSIS — R7881 Bacteremia: Secondary | ICD-10-CM | POA: Diagnosis present

## 2020-09-29 DIAGNOSIS — Y792 Prosthetic and other implants, materials and accessory orthopedic devices associated with adverse incidents: Secondary | ICD-10-CM | POA: Diagnosis present

## 2020-09-29 DIAGNOSIS — G4730 Sleep apnea, unspecified: Secondary | ICD-10-CM | POA: Diagnosis not present

## 2020-09-29 DIAGNOSIS — E669 Obesity, unspecified: Secondary | ICD-10-CM | POA: Diagnosis present

## 2020-09-29 DIAGNOSIS — K219 Gastro-esophageal reflux disease without esophagitis: Secondary | ICD-10-CM | POA: Diagnosis present

## 2020-09-29 DIAGNOSIS — M79605 Pain in left leg: Secondary | ICD-10-CM | POA: Diagnosis not present

## 2020-09-29 DIAGNOSIS — R0602 Shortness of breath: Secondary | ICD-10-CM | POA: Diagnosis not present

## 2020-09-29 DIAGNOSIS — G9341 Metabolic encephalopathy: Secondary | ICD-10-CM | POA: Diagnosis present

## 2020-09-29 DIAGNOSIS — L304 Erythema intertrigo: Secondary | ICD-10-CM | POA: Diagnosis present

## 2020-09-29 DIAGNOSIS — F419 Anxiety disorder, unspecified: Secondary | ICD-10-CM | POA: Diagnosis not present

## 2020-09-29 DIAGNOSIS — J90 Pleural effusion, not elsewhere classified: Secondary | ICD-10-CM | POA: Diagnosis not present

## 2020-09-29 DIAGNOSIS — N390 Urinary tract infection, site not specified: Secondary | ICD-10-CM | POA: Diagnosis not present

## 2020-09-29 DIAGNOSIS — M869 Osteomyelitis, unspecified: Secondary | ICD-10-CM | POA: Diagnosis present

## 2020-09-29 DIAGNOSIS — R404 Transient alteration of awareness: Secondary | ICD-10-CM | POA: Diagnosis not present

## 2020-09-29 DIAGNOSIS — T84614A Infection and inflammatory reaction due to internal fixation device of right ulna, initial encounter: Secondary | ICD-10-CM | POA: Diagnosis not present

## 2020-09-29 DIAGNOSIS — J439 Emphysema, unspecified: Secondary | ICD-10-CM | POA: Diagnosis not present

## 2020-09-29 DIAGNOSIS — Z923 Personal history of irradiation: Secondary | ICD-10-CM | POA: Diagnosis not present

## 2020-09-29 DIAGNOSIS — I10 Essential (primary) hypertension: Secondary | ICD-10-CM | POA: Diagnosis present

## 2020-09-29 LAB — SARS CORONAVIRUS 2 (TAT 6-24 HRS): SARS Coronavirus 2: NEGATIVE

## 2020-09-30 ENCOUNTER — Ambulatory Visit (HOSPITAL_COMMUNITY): Admission: RE | Admit: 2020-09-30 | Payer: Medicare Other | Source: Home / Self Care | Admitting: Student

## 2020-09-30 ENCOUNTER — Ambulatory Visit: Payer: Self-pay | Admitting: Student

## 2020-09-30 DIAGNOSIS — R7881 Bacteremia: Secondary | ICD-10-CM

## 2020-09-30 DIAGNOSIS — S52021D Displaced fracture of olecranon process without intraarticular extension of right ulna, subsequent encounter for closed fracture with routine healing: Secondary | ICD-10-CM

## 2020-09-30 DIAGNOSIS — S52021A Displaced fracture of olecranon process without intraarticular extension of right ulna, initial encounter for closed fracture: Secondary | ICD-10-CM | POA: Insufficient documentation

## 2020-09-30 SURGERY — REMOVAL, HARDWARE
Anesthesia: Regional | Laterality: Right

## 2020-10-01 ENCOUNTER — Inpatient Hospital Stay (HOSPITAL_COMMUNITY)
Admission: RE | Admit: 2020-10-01 | Discharge: 2020-10-04 | DRG: 497 | Disposition: A | Discharge status: Home Health | Payer: Medicare Other | Source: Other Acute Inpatient Hospital | Attending: Student | Admitting: Student

## 2020-10-01 DIAGNOSIS — B9562 Methicillin resistant Staphylococcus aureus infection as the cause of diseases classified elsewhere: Secondary | ICD-10-CM | POA: Diagnosis present

## 2020-10-01 DIAGNOSIS — I10 Essential (primary) hypertension: Secondary | ICD-10-CM | POA: Diagnosis present

## 2020-10-01 DIAGNOSIS — M71121 Other infective bursitis, right elbow: Secondary | ICD-10-CM | POA: Diagnosis present

## 2020-10-01 DIAGNOSIS — T847XXA Infection and inflammatory reaction due to other internal orthopedic prosthetic devices, implants and grafts, initial encounter: Secondary | ICD-10-CM | POA: Diagnosis not present

## 2020-10-01 DIAGNOSIS — Z20822 Contact with and (suspected) exposure to covid-19: Secondary | ICD-10-CM | POA: Diagnosis present

## 2020-10-01 DIAGNOSIS — Z87891 Personal history of nicotine dependence: Secondary | ICD-10-CM | POA: Diagnosis not present

## 2020-10-01 DIAGNOSIS — T84614A Infection and inflammatory reaction due to internal fixation device of right ulna, initial encounter: Secondary | ICD-10-CM | POA: Diagnosis present

## 2020-10-01 DIAGNOSIS — Z85820 Personal history of malignant melanoma of skin: Secondary | ICD-10-CM

## 2020-10-01 DIAGNOSIS — E039 Hypothyroidism, unspecified: Secondary | ICD-10-CM | POA: Diagnosis present

## 2020-10-01 DIAGNOSIS — E782 Mixed hyperlipidemia: Secondary | ICD-10-CM | POA: Diagnosis present

## 2020-10-01 DIAGNOSIS — Z419 Encounter for procedure for purposes other than remedying health state, unspecified: Secondary | ICD-10-CM

## 2020-10-01 DIAGNOSIS — Z9981 Dependence on supplemental oxygen: Secondary | ICD-10-CM

## 2020-10-01 DIAGNOSIS — S52021A Displaced fracture of olecranon process without intraarticular extension of right ulna, initial encounter for closed fracture: Secondary | ICD-10-CM

## 2020-10-01 DIAGNOSIS — J439 Emphysema, unspecified: Secondary | ICD-10-CM | POA: Diagnosis present

## 2020-10-01 DIAGNOSIS — S52021D Displaced fracture of olecranon process without intraarticular extension of right ulna, subsequent encounter for closed fracture with routine healing: Secondary | ICD-10-CM

## 2020-10-01 DIAGNOSIS — M7021 Olecranon bursitis, right elbow: Secondary | ICD-10-CM | POA: Diagnosis not present

## 2020-10-01 DIAGNOSIS — Z472 Encounter for removal of internal fixation device: Secondary | ICD-10-CM | POA: Diagnosis not present

## 2020-10-01 DIAGNOSIS — F419 Anxiety disorder, unspecified: Secondary | ICD-10-CM | POA: Diagnosis present

## 2020-10-01 DIAGNOSIS — G4730 Sleep apnea, unspecified: Secondary | ICD-10-CM | POA: Diagnosis present

## 2020-10-01 DIAGNOSIS — Y792 Prosthetic and other implants, materials and accessory orthopedic devices associated with adverse incidents: Secondary | ICD-10-CM | POA: Diagnosis present

## 2020-10-01 LAB — CBC
HCT: 31.5 % — ABNORMAL LOW (ref 36.0–46.0)
Hemoglobin: 9.5 g/dL — ABNORMAL LOW (ref 12.0–15.0)
MCH: 30.3 pg (ref 26.0–34.0)
MCHC: 30.2 g/dL (ref 30.0–36.0)
MCV: 100.3 fL — ABNORMAL HIGH (ref 80.0–100.0)
Platelets: 140 10*3/uL — ABNORMAL LOW (ref 150–400)
RBC: 3.14 MIL/uL — ABNORMAL LOW (ref 3.87–5.11)
RDW: 15.4 % (ref 11.5–15.5)
WBC: 6.3 10*3/uL (ref 4.0–10.5)
nRBC: 0 % (ref 0.0–0.2)

## 2020-10-01 LAB — COMPREHENSIVE METABOLIC PANEL
ALT: 18 U/L (ref 0–44)
AST: 20 U/L (ref 15–41)
Albumin: 2.3 g/dL — ABNORMAL LOW (ref 3.5–5.0)
Alkaline Phosphatase: 131 U/L — ABNORMAL HIGH (ref 38–126)
Anion gap: 10 (ref 5–15)
BUN: 12 mg/dL (ref 8–23)
CO2: 37 mmol/L — ABNORMAL HIGH (ref 22–32)
Calcium: 9.1 mg/dL (ref 8.9–10.3)
Chloride: 89 mmol/L — ABNORMAL LOW (ref 98–111)
Creatinine, Ser: 0.95 mg/dL (ref 0.44–1.00)
GFR, Estimated: >60 mL/min (ref >60)
Glucose, Bld: 113 mg/dL — ABNORMAL HIGH (ref 70–99)
Potassium: 3.3 mmol/L — ABNORMAL LOW (ref 3.5–5.1)
Sodium: 136 mmol/L (ref 135–145)
Total Bilirubin: 0.4 mg/dL (ref 0.3–1.2)
Total Protein: 5.5 g/dL — ABNORMAL LOW (ref 6.5–8.1)

## 2020-10-01 MED ORDER — FLUTICASONE FUROATE-VILANTEROL 100-25 MCG/INH IN AEPB
1.0000 | INHALATION_SPRAY | Freq: Every day | RESPIRATORY_TRACT | Status: DC
  Administered 2020-10-03 – 2020-10-04 (×2): 1 via RESPIRATORY_TRACT
  Filled 2020-10-01: qty 28

## 2020-10-01 MED ORDER — METHOCARBAMOL 1000 MG/10ML IJ SOLN
500.0000 mg | Freq: Four times a day (QID) | INTRAVENOUS | Status: DC | PRN
  Filled 2020-10-01: qty 5

## 2020-10-01 MED ORDER — ONDANSETRON HCL 4 MG PO TABS: 4.0000 mg | ORAL_TABLET | Freq: Four times a day (QID) | ORAL | Status: DC | PRN

## 2020-10-01 MED ORDER — IRBESARTAN 150 MG PO TABS
150.0000 mg | ORAL_TABLET | Freq: Every day | ORAL | Status: DC
  Administered 2020-10-02 – 2020-10-04 (×3): 150 mg via ORAL
  Filled 2020-10-01 (×3): qty 1

## 2020-10-01 MED ORDER — VALSARTAN-HYDROCHLOROTHIAZIDE 160-12.5 MG PO TABS: 1.0000 | ORAL_TABLET | Freq: Every day | ORAL | Status: DC

## 2020-10-01 MED ORDER — HYDROCODONE-ACETAMINOPHEN 5-325 MG PO TABS: 1.0000 | ORAL_TABLET | Freq: Four times a day (QID) | ORAL | Status: DC | PRN

## 2020-10-01 MED ORDER — ACETAMINOPHEN 325 MG PO TABS
325.0000 mg | ORAL_TABLET | Freq: Four times a day (QID) | ORAL | Status: DC | PRN
  Administered 2020-10-02: 650 mg via ORAL
  Administered 2020-10-03: 325 mg via ORAL
  Filled 2020-10-01 (×3): qty 1

## 2020-10-01 MED ORDER — IPRATROPIUM-ALBUTEROL 0.5-2.5 (3) MG/3ML IN SOLN: 3.0000 mL | RESPIRATORY_TRACT | Status: DC | PRN

## 2020-10-01 MED ORDER — ALPRAZOLAM 0.5 MG PO TABS
0.5000 mg | ORAL_TABLET | Freq: Every day | ORAL | Status: DC
  Administered 2020-10-01 – 2020-10-03 (×3): 0.5 mg via ORAL
  Filled 2020-10-01 (×3): qty 1

## 2020-10-01 MED ORDER — TRAMADOL HCL 50 MG PO TABS: 50.0000 mg | ORAL_TABLET | Freq: Four times a day (QID) | ORAL | Status: DC | PRN

## 2020-10-01 MED ORDER — METHOCARBAMOL 500 MG PO TABS: 500.0000 mg | ORAL_TABLET | Freq: Four times a day (QID) | ORAL | Status: DC | PRN

## 2020-10-01 MED ORDER — POTASSIUM CHLORIDE IN NACL 20-0.9 MEQ/L-% IV SOLN
INTRAVENOUS | Status: DC
  Filled 2020-10-01 (×4): qty 1000

## 2020-10-01 MED ORDER — DOCUSATE SODIUM 100 MG PO CAPS
100.0000 mg | ORAL_CAPSULE | Freq: Every day | ORAL | Status: DC
  Administered 2020-10-03: 100 mg via ORAL
  Filled 2020-10-01 (×3): qty 1

## 2020-10-01 MED ORDER — PREDNISONE 20 MG PO TABS
20.0000 mg | ORAL_TABLET | Freq: Every day | ORAL | Status: DC
  Administered 2020-10-02 – 2020-10-04 (×3): 20 mg via ORAL
  Filled 2020-10-01 (×3): qty 1

## 2020-10-01 MED ORDER — BUSPIRONE HCL 10 MG PO TABS
30.0000 mg | ORAL_TABLET | Freq: Two times a day (BID) | ORAL | Status: DC
  Administered 2020-10-01 – 2020-10-04 (×5): 30 mg via ORAL
  Filled 2020-10-01 (×3): qty 3
  Filled 2020-10-01: qty 6
  Filled 2020-10-01: qty 3

## 2020-10-01 MED ORDER — ONDANSETRON HCL 4 MG/2ML IJ SOLN: 4.0000 mg | Freq: Four times a day (QID) | INTRAMUSCULAR | Status: DC | PRN

## 2020-10-01 MED ORDER — HYDROCHLOROTHIAZIDE 12.5 MG PO CAPS
12.5000 mg | ORAL_CAPSULE | Freq: Every day | ORAL | Status: DC
  Administered 2020-10-02 – 2020-10-04 (×3): 12.5 mg via ORAL
  Filled 2020-10-01 (×3): qty 1

## 2020-10-01 MED ORDER — FLUTICASONE-UMECLIDIN-VILANT 100-62.5-25 MCG/INH IN AEPB: 1.0000 | INHALATION_SPRAY | Freq: Every day | RESPIRATORY_TRACT | Status: DC

## 2020-10-01 MED ORDER — UMECLIDINIUM BROMIDE 62.5 MCG/INH IN AEPB
1.0000 | INHALATION_SPRAY | Freq: Every day | RESPIRATORY_TRACT | Status: DC
  Administered 2020-10-03 – 2020-10-04 (×2): 1 via RESPIRATORY_TRACT
  Filled 2020-10-01: qty 7

## 2020-10-01 MED ORDER — ASPIRIN EC 81 MG PO TBEC
81.0000 mg | DELAYED_RELEASE_TABLET | Freq: Every day | ORAL | Status: DC
Start: 2020-10-03 — End: 2020-10-04
  Administered 2020-10-03 – 2020-10-04 (×2): 81 mg via ORAL
  Filled 2020-10-01 (×2): qty 1

## 2020-10-01 MED ORDER — LEVOTHYROXINE SODIUM 25 MCG PO TABS
137.0000 ug | ORAL_TABLET | Freq: Every day | ORAL | Status: DC
  Administered 2020-10-02 – 2020-10-04 (×3): 137 ug via ORAL
  Filled 2020-10-01 (×3): qty 1

## 2020-10-01 MED ORDER — ALBUTEROL SULFATE HFA 108 (90 BASE) MCG/ACT IN AERS: 2.0000 | INHALATION_SPRAY | RESPIRATORY_TRACT | Status: DC | PRN

## 2020-10-01 MED ORDER — BUPROPION HCL ER (XL) 150 MG PO TB24
300.0000 mg | ORAL_TABLET | Freq: Every day | ORAL | Status: DC
  Administered 2020-10-02 – 2020-10-04 (×3): 300 mg via ORAL
  Filled 2020-10-01 (×3): qty 2

## 2020-10-01 MED ORDER — ATORVASTATIN CALCIUM 10 MG PO TABS
20.0000 mg | ORAL_TABLET | Freq: Every day | ORAL | Status: DC
  Administered 2020-10-01 – 2020-10-03 (×3): 20 mg via ORAL
  Filled 2020-10-01 (×3): qty 2

## 2020-10-01 MED ORDER — METOPROLOL SUCCINATE ER 50 MG PO TB24
50.0000 mg | ORAL_TABLET | Freq: Every day | ORAL | Status: DC
  Administered 2020-10-02 – 2020-10-04 (×3): 50 mg via ORAL
  Filled 2020-10-01 (×3): qty 1

## 2020-10-01 NOTE — Progress Notes (Signed)
I reached Holly [name] on her cell phone, patient informs me that she is in [facility]. Patent asked, "Am I having surgery tomorrow"?  I told her she is on the schedule, but I had no information regarding patient being transferred to [facility], I told her I would get and call her back. I called Dr. Tama Headings answering service and Patrecia Pace, PA-C called back and confirmed that patient is to be transfered to [facility], plan is for tonight, but does she have a bed assignment.  I called Bed Placement and was informed that Holly Lovins was just assigned a bed and will be transferred tonight. I called Patrecia Pace and Holly Gengler and informed them.

## 2020-10-02 ENCOUNTER — Encounter (HOSPITAL_COMMUNITY)
Admission: RE | Disposition: A | Discharge status: Home / Self Care | Payer: Self-pay | Source: Home / Self Care | Attending: Student

## 2020-10-02 ENCOUNTER — Inpatient Hospital Stay (HOSPITAL_COMMUNITY): Payer: Medicare Other

## 2020-10-02 ENCOUNTER — Encounter (HOSPITAL_COMMUNITY): Payer: Self-pay | Admitting: Student

## 2020-10-02 ENCOUNTER — Inpatient Hospital Stay (HOSPITAL_COMMUNITY): Payer: Medicare Other | Admitting: Certified Registered"

## 2020-10-02 ENCOUNTER — Other Ambulatory Visit: Payer: Self-pay

## 2020-10-02 DIAGNOSIS — M7021 Olecranon bursitis, right elbow: Secondary | ICD-10-CM

## 2020-10-02 HISTORY — PX: HARDWARE REMOVAL: SHX979

## 2020-10-02 IMAGING — RF DG ELBOW 2V*R*
1 series · 1 of 1 positions shown · non-contrast
Comparison: [DATE]

CLINICAL DATA: Hardware removal

EXAM:
RIGHT ELBOW - 2 VIEW; DG C-ARM 1-60 MIN

[Series 1: run · 1 of 1 slices shown]
[im 1/1]
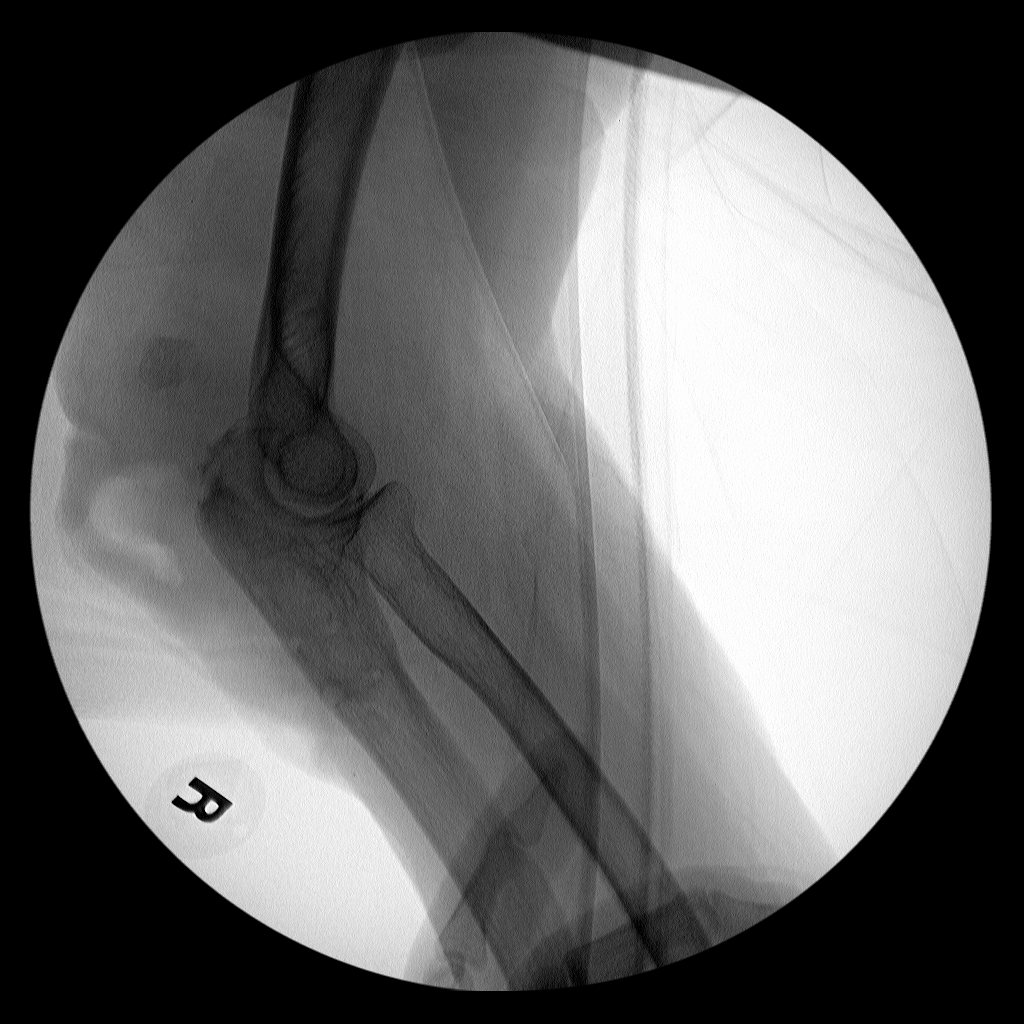

[1 of 1 positions shown; findings below may reference images not displayed]

FLUOROSCOPY TIME:  Fluoroscopy Time:  3 seconds

Radiation Exposure Index (if provided by the fluoroscopic device):
0.14 mGy

Number of Acquired Spot Images: 1
FINDINGS: Interval removal of fixation sideplate and multiple fixation screws
in the proximal ulna is noted. Persistent calcification is noted in
the posterior soft tissues just above the olecranon.
IMPRESSION: Interval hardware removal.

## 2020-10-02 IMAGING — RF DG C-ARM 1-60 MIN
1 series · 1 of 1 positions shown · non-contrast
Comparison: [DATE]

CLINICAL DATA: Hardware removal

EXAM:
RIGHT ELBOW - 2 VIEW; DG C-ARM 1-60 MIN

[Series 1: run · 1 of 1 slices shown]
[im 1/1]
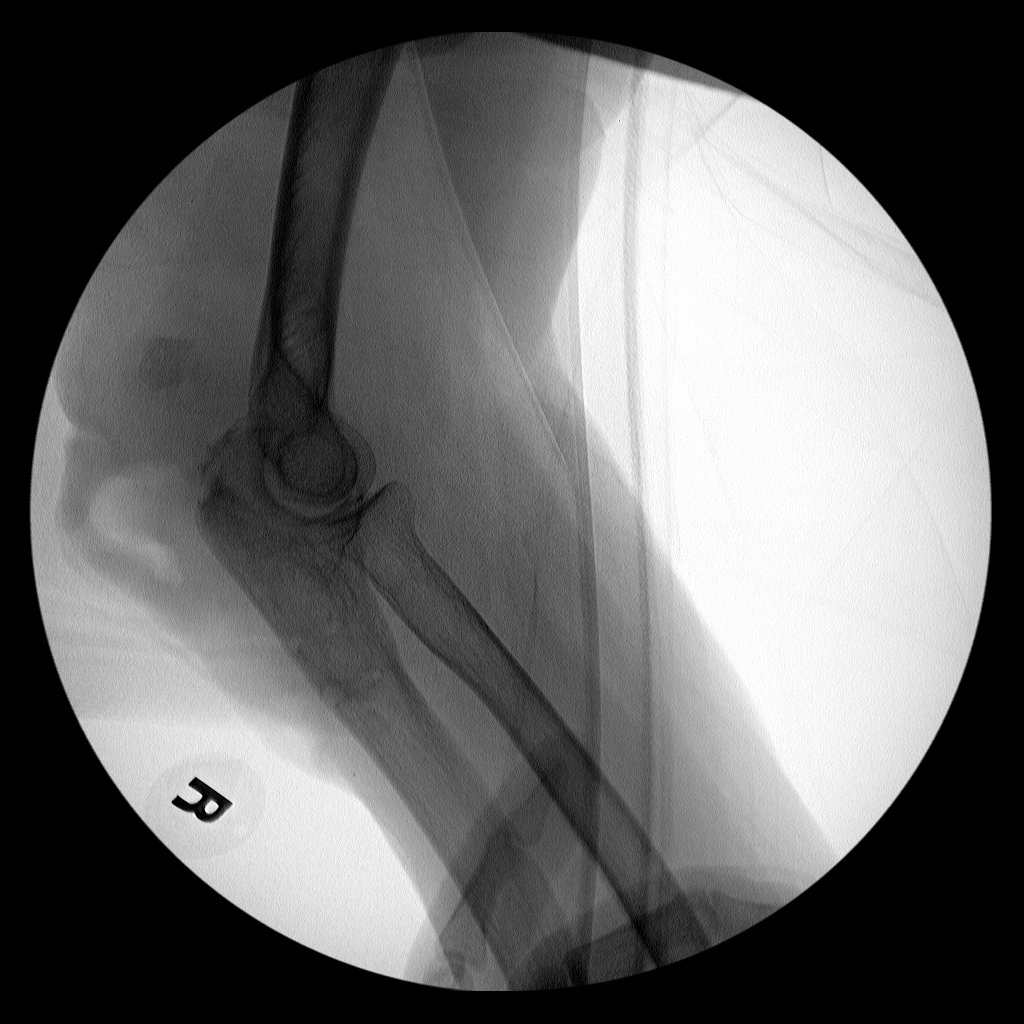

[1 of 1 positions shown; findings below may reference images not displayed]

FLUOROSCOPY TIME:  Fluoroscopy Time:  3 seconds

Radiation Exposure Index (if provided by the fluoroscopic device):
0.14 mGy

Number of Acquired Spot Images: 1
FINDINGS: Interval removal of fixation sideplate and multiple fixation screws
in the proximal ulna is noted. Persistent calcification is noted in
the posterior soft tissues just above the olecranon.
IMPRESSION: Interval hardware removal.

## 2020-10-02 SURGERY — REMOVAL, HARDWARE
Anesthesia: General | Site: Elbow | Laterality: Right

## 2020-10-02 MED ORDER — DEXAMETHASONE SODIUM PHOSPHATE 10 MG/ML IJ SOLN
INTRAMUSCULAR | Status: DC | PRN
  Administered 2020-10-02: 10 mg via INTRAVENOUS

## 2020-10-02 MED ORDER — ROCURONIUM BROMIDE 10 MG/ML (PF) SYRINGE
PREFILLED_SYRINGE | INTRAVENOUS | Status: AC
  Filled 2020-10-02: qty 10

## 2020-10-02 MED ORDER — ONDANSETRON HCL 4 MG/2ML IJ SOLN
INTRAMUSCULAR | Status: DC | PRN
  Administered 2020-10-02: 4 mg via INTRAVENOUS

## 2020-10-02 MED ORDER — BUPIVACAINE HCL (PF) 0.25 % IJ SOLN
INTRAMUSCULAR | Status: AC
  Filled 2020-10-02: qty 10

## 2020-10-02 MED ORDER — METOPROLOL SUCCINATE ER 25 MG PO TB24
ORAL_TABLET | ORAL | Status: AC
  Administered 2020-10-02: 50 mg
  Filled 2020-10-02: qty 2

## 2020-10-02 MED ORDER — ENOXAPARIN SODIUM 40 MG/0.4ML ~~LOC~~ SOLN
40.0000 mg | SUBCUTANEOUS | Status: DC
  Administered 2020-10-03 – 2020-10-04 (×2): 40 mg via SUBCUTANEOUS
  Filled 2020-10-02 (×2): qty 0.4

## 2020-10-02 MED ORDER — LACTATED RINGERS IV SOLN: INTRAVENOUS | Status: DC | PRN

## 2020-10-02 MED ORDER — EPHEDRINE SULFATE-NACL 50-0.9 MG/10ML-% IV SOSY
PREFILLED_SYRINGE | INTRAVENOUS | Status: DC | PRN
  Administered 2020-10-02: 10 mg via INTRAVENOUS
  Administered 2020-10-02 (×2): 20 mg via INTRAVENOUS

## 2020-10-02 MED ORDER — 0.9 % SODIUM CHLORIDE (POUR BTL) OPTIME
TOPICAL | Status: DC | PRN
  Administered 2020-10-02: 1000 mL

## 2020-10-02 MED ORDER — PHENYLEPHRINE HCL-NACL 10-0.9 MG/250ML-% IV SOLN
INTRAVENOUS | Status: DC | PRN
  Administered 2020-10-02: 50 ug/min via INTRAVENOUS

## 2020-10-02 MED ORDER — PROPOFOL 10 MG/ML IV BOLUS
INTRAVENOUS | Status: DC | PRN
  Administered 2020-10-02: 160 mg via INTRAVENOUS

## 2020-10-02 MED ORDER — CEFAZOLIN SODIUM-DEXTROSE 2-4 GM/100ML-% IV SOLN
2.0000 g | INTRAVENOUS | Status: AC
  Administered 2020-10-02: 2 g via INTRAVENOUS
  Filled 2020-10-02: qty 100

## 2020-10-02 MED ORDER — VANCOMYCIN HCL 1000 MG IV SOLR
INTRAVENOUS | Status: AC
  Filled 2020-10-02: qty 1000

## 2020-10-02 MED ORDER — VANCOMYCIN HCL 1250 MG/250ML IV SOLN
1250.0000 mg | INTRAVENOUS | Status: DC
  Administered 2020-10-02 – 2020-10-04 (×3): 1250 mg via INTRAVENOUS
  Filled 2020-10-02 (×3): qty 250

## 2020-10-02 MED ORDER — BUPIVACAINE HCL (PF) 0.25 % IJ SOLN
INTRAMUSCULAR | Status: DC | PRN
  Administered 2020-10-02: 10 mL

## 2020-10-02 MED ORDER — FENTANYL CITRATE (PF) 250 MCG/5ML IJ SOLN
INTRAMUSCULAR | Status: AC
  Filled 2020-10-02: qty 5

## 2020-10-02 MED ORDER — MEPERIDINE HCL 25 MG/ML IJ SOLN: 6.2500 mg | INTRAMUSCULAR | Status: DC | PRN

## 2020-10-02 MED ORDER — FENTANYL CITRATE (PF) 100 MCG/2ML IJ SOLN
INTRAMUSCULAR | Status: DC | PRN
  Administered 2020-10-02 (×2): 50 ug via INTRAVENOUS

## 2020-10-02 MED ORDER — FENTANYL CITRATE (PF) 100 MCG/2ML IJ SOLN: 25.0000 ug | INTRAMUSCULAR | Status: DC | PRN

## 2020-10-02 MED ORDER — PHENYLEPHRINE 40 MCG/ML (10ML) SYRINGE FOR IV PUSH (FOR BLOOD PRESSURE SUPPORT)
PREFILLED_SYRINGE | INTRAVENOUS | Status: DC | PRN
  Administered 2020-10-02: 120 ug via INTRAVENOUS
  Administered 2020-10-02: 160 ug via INTRAVENOUS
  Administered 2020-10-02: 120 ug via INTRAVENOUS

## 2020-10-02 MED ORDER — VANCOMYCIN HCL 1000 MG IV SOLR
INTRAVENOUS | Status: DC | PRN
  Administered 2020-10-02: 1000 mg

## 2020-10-02 MED ORDER — CHLORHEXIDINE GLUCONATE 0.12 % MT SOLN
OROMUCOSAL | Status: AC
  Administered 2020-10-02: 15 mL
  Filled 2020-10-02: qty 15

## 2020-10-02 MED ORDER — LIDOCAINE 2% (20 MG/ML) 5 ML SYRINGE
INTRAMUSCULAR | Status: AC
  Filled 2020-10-02: qty 5

## 2020-10-02 MED ORDER — LIDOCAINE 2% (20 MG/ML) 5 ML SYRINGE
INTRAMUSCULAR | Status: DC | PRN
  Administered 2020-10-02: 100 mg via INTRAVENOUS

## 2020-10-02 MED ORDER — ONDANSETRON HCL 4 MG/2ML IJ SOLN: 4.0000 mg | Freq: Once | INTRAMUSCULAR | Status: DC | PRN

## 2020-10-02 MED ORDER — PROPOFOL 10 MG/ML IV BOLUS
INTRAVENOUS | Status: AC
  Filled 2020-10-02: qty 20

## 2020-10-02 SURGICAL SUPPLY — 69 items
APL PRP STRL LF DISP 70% ISPRP (MISCELLANEOUS) ×1
BANDAGE ESMARK 6X9 LF (GAUZE/BANDAGES/DRESSINGS) ×1 IMPLANT
BNDG CMPR 9X6 STRL LF SNTH (GAUZE/BANDAGES/DRESSINGS) ×1
BNDG COHESIVE 6X5 TAN STRL LF (GAUZE/BANDAGES/DRESSINGS) ×1 IMPLANT
BNDG ELASTIC 4X5.8 VLCR STR LF (GAUZE/BANDAGES/DRESSINGS) ×2 IMPLANT
BNDG ELASTIC 6X5.8 VLCR STR LF (GAUZE/BANDAGES/DRESSINGS) ×2 IMPLANT
BNDG ESMARK 6X9 LF (GAUZE/BANDAGES/DRESSINGS) ×2
BNDG GAUZE ELAST 4 BULKY (GAUZE/BANDAGES/DRESSINGS) ×2 IMPLANT
BRUSH SCRUB EZ PLAIN DRY (MISCELLANEOUS) ×4 IMPLANT
CHLORAPREP W/TINT 26 (MISCELLANEOUS) ×2 IMPLANT
COVER SURGICAL LIGHT HANDLE (MISCELLANEOUS) ×3 IMPLANT
COVER WAND RF STERILE (DRAPES) ×1 IMPLANT
CUFF TOURN SGL QUICK 18X4 (TOURNIQUET CUFF) IMPLANT
CUFF TOURN SGL QUICK 24 (TOURNIQUET CUFF)
CUFF TOURN SGL QUICK 34 (TOURNIQUET CUFF)
CUFF TRNQT CYL 24X4X16.5-23 (TOURNIQUET CUFF) IMPLANT
CUFF TRNQT CYL 34X4.125X (TOURNIQUET CUFF) IMPLANT
DRAPE C-ARM 42X72 X-RAY (DRAPES) IMPLANT
DRAPE C-ARMOR (DRAPES) ×2 IMPLANT
DRAPE U-SHAPE 47X51 STRL (DRAPES) ×2 IMPLANT
DRSG ADAPTIC 3X8 NADH LF (GAUZE/BANDAGES/DRESSINGS) ×2 IMPLANT
DRSG MEPITEL 4X7.2 (GAUZE/BANDAGES/DRESSINGS) ×1 IMPLANT
ELECT REM PT RETURN 9FT ADLT (ELECTROSURGICAL) ×2
ELECTRODE REM PT RTRN 9FT ADLT (ELECTROSURGICAL) ×1 IMPLANT
GAUZE SPONGE 4X4 12PLY STRL (GAUZE/BANDAGES/DRESSINGS) ×1 IMPLANT
GAUZE SPONGE 4X4 12PLY STRL LF (GAUZE/BANDAGES/DRESSINGS) ×1 IMPLANT
GLOVE BIO SURGEON STRL SZ 6.5 (GLOVE) ×6 IMPLANT
GLOVE BIO SURGEON STRL SZ7.5 (GLOVE) ×8 IMPLANT
GLOVE BIOGEL PI IND STRL 6.5 (GLOVE) ×1 IMPLANT
GLOVE BIOGEL PI IND STRL 7.5 (GLOVE) ×1 IMPLANT
GLOVE BIOGEL PI INDICATOR 6.5 (GLOVE) ×1
GLOVE BIOGEL PI INDICATOR 7.5 (GLOVE) ×1
GOWN STRL REUS W/ TWL LRG LVL3 (GOWN DISPOSABLE) ×2 IMPLANT
GOWN STRL REUS W/TWL LRG LVL3 (GOWN DISPOSABLE) ×6
KIT BASIN OR (CUSTOM PROCEDURE TRAY) ×2 IMPLANT
KIT TURNOVER KIT B (KITS) ×2 IMPLANT
MANIFOLD NEPTUNE II (INSTRUMENTS) ×2 IMPLANT
NDL 18GX1X1/2 (RX/OR ONLY) (NEEDLE) IMPLANT
NEEDLE 18GX1X1/2 (RX/OR ONLY) (NEEDLE) ×2 IMPLANT
NEEDLE 22X1 1/2 (OR ONLY) (NEEDLE) ×1 IMPLANT
NS IRRIG 1000ML POUR BTL (IV SOLUTION) ×2 IMPLANT
PACK ORTHO EXTREMITY (CUSTOM PROCEDURE TRAY) ×2 IMPLANT
PAD ABD 8X10 STRL (GAUZE/BANDAGES/DRESSINGS) ×1 IMPLANT
PAD ARMBOARD 7.5X6 YLW CONV (MISCELLANEOUS) ×4 IMPLANT
PAD CAST 4YDX4 CTTN HI CHSV (CAST SUPPLIES) IMPLANT
PADDING CAST COTTON 4X4 STRL (CAST SUPPLIES) ×2
PADDING CAST COTTON 6X4 STRL (CAST SUPPLIES) ×3 IMPLANT
SPONGE LAP 18X18 RF (DISPOSABLE) ×2 IMPLANT
STAPLER VISISTAT 35W (STAPLE) IMPLANT
STOCKINETTE IMPERVIOUS LG (DRAPES) ×2 IMPLANT
STRIP CLOSURE SKIN 1/2X4 (GAUZE/BANDAGES/DRESSINGS) IMPLANT
SUCTION FRAZIER HANDLE 10FR (MISCELLANEOUS)
SUCTION TUBE FRAZIER 10FR DISP (MISCELLANEOUS) IMPLANT
SUT ETHILON 3 0 PS 1 (SUTURE) ×1 IMPLANT
SUT MNCRL AB 3-0 PS2 18 (SUTURE) ×2 IMPLANT
SUT MON AB 2-0 CT1 36 (SUTURE) ×3 IMPLANT
SUT PDS AB 2-0 CT1 27 (SUTURE) IMPLANT
SUT VIC AB 0 CT1 27 (SUTURE)
SUT VIC AB 0 CT1 27XBRD ANBCTR (SUTURE) IMPLANT
SUT VIC AB 2-0 CT1 27 (SUTURE)
SUT VIC AB 2-0 CT1 TAPERPNT 27 (SUTURE) IMPLANT
SYR 10ML LL (SYRINGE) ×1 IMPLANT
SYR CONTROL 10ML LL (SYRINGE) ×1 IMPLANT
TOWEL GREEN STERILE (TOWEL DISPOSABLE) ×4 IMPLANT
TOWEL GREEN STERILE FF (TOWEL DISPOSABLE) ×4 IMPLANT
TUBE CONNECTING 12X1/4 (SUCTIONS) ×2 IMPLANT
UNDERPAD 30X36 HEAVY ABSORB (UNDERPADS AND DIAPERS) ×2 IMPLANT
WATER STERILE IRR 1000ML POUR (IV SOLUTION) ×2 IMPLANT
YANKAUER SUCT BULB TIP NO VENT (SUCTIONS) ×2 IMPLANT

## 2020-10-02 NOTE — Op Note (Signed)
Orthopaedic Surgery Operative Note (CSN: [id number] ) Date of Surgery: 10/01/2020 - 10/02/2020  Admit Date: 10/01/2020   Diagnoses: Pre-Op Diagnoses: Right healed olecranon fracture Septic olecranon bursitis with open wound   Post-Op Diagnosis: Same  Procedures: 1. CPT 20680-Removal of hardware right olecranon 2. CPT 24105-Excisions of olecranon septic bursitis   Surgeons : Primary: Shona Needles, MD  Assistant: Patrecia Pace, PA-C  Location: OR 3   Anesthesia:General  Antibiotics: Ancef 2g preop with 1 gm vancomycin powder placed topically   Tourniquet time:None  Estimated Blood LZJQ:73 mL  Complications:None   Specimens: ID Type Source Tests Collected by Time Destination  A : right elbow tissue Tissue Soft Tissue, Other AEROBIC/ANAEROBIC CULTURE (SURGICAL/DEEP WOUND) Shona Needles, MD 10/02/2020 [phone]      Implants: Implant Name Type Inv. Item Serial No. Manufacturer Lot No. LRB No. Used Action  PLATE OLECRANON PROX 2.7/3.5 - [id number] Plate PLATE OLECRANON PROX 2.7/3.5  [employer]  Right 1 Explanted  SCREW LOCKING 2.7X14MM - [id number] Screw SCREW LOCKING 2.7X14MM  [employer]  Right 1 Explanted  SCREW LOCKING 2.7X18MM - [id number] Screw SCREW LOCKING 2.7X18MM  [employer]  Right 1 Explanted  SCREW LOCKING VA 2.7X30MM - [id number] Screw SCREW LOCKING VA 2.7X30MM  [employer]  Right 1 Explanted  SCREW LOCKING 2.7X24MM - [id number] Screw SCREW LOCKING 2.7X24MM  [employer]  Right 1 Explanted  SCREW LOCKING VA 2.7X40MM - [id number] Screw SCREW LOCKING VA 2.7X40MM  [employer]  Right 1 Explanted  SCREW LOCKING 2.7X28 - [id number] Screw SCREW LOCKING 2.7X28  [employer]  Right 1 Explanted     Indications for Surgery: 74 year old female with right olecranon bursitis following ORIF olecranon fracture 2 years ago.  She became acutely septic prior to a planned hardware removal for open wound and mild drainage.  She improved with IV antibiotics.  However I  felt that proceeding for excision of olecranon bursitis along with a removal of right olecranon hardware was most appropriate.  Risks and benefits were discussed with the patient and her daughter.  Risks include but not limited to bleeding, infection, osteomyelitis, refracture, nerve and blood vessel injury, recurrence of open wound, elbow stiffness, even the possibility anesthetic complications.  Patient agreed to proceed with surgery and consent was obtained.  Operative Findings: 1.  Purulent olecranon bursa that was sent for culture.  Excisional debridement was performed after exposure of the hardware. 2.  Successful removal of left olecranon hardware  Procedure: The patient was identified in the preoperative holding area. Consent was confirmed with the patient and their family and all questions were answered. The operative extremity was marked after confirmation with the patient. she was then brought back to the operating room by our anesthesia colleagues.  She is carefully transferred over to a regular OR table.  She was placed under general anesthetic.  The right upper extremity was then prepped and draped in usual sterile fashion.  A timeout was performed to verify the patient, the procedure, and the extremity.  Preoperative antibiotics were dosed.  The wound that she had been draining had purulent material that I expressed from the olecranon bursa.  I sent this for culture.  I then reopened the incision I had made for the olecranon open reduction internal fixation.  I used Bovie electrocautery to excise the bursa as well as any of the infected tissue.  I then proceeded to remove the olecranon hardware without difficulty.  I used a elevator to debride the bone and soft tissue.  There was no overt signs of any osteomyelitis in the olecranon.  A fluoroscopic imaging was obtained which showed no retained hardware.  The wound was then irrigated.  Gloves and instruments were changed.  I then placed a gram  of vancomycin powder into the incision.  A layer closure of 2-0 Monocryl and 3-0 nylon was used.  Sterile dressing was placed consisting of Mepitel, 4 x 4's, sterile cast padding and Ace wrap.  She is awoken and taken to the PACU in stable condition.  Post Op Plan/Instructions: The patient will be weightbearing as tolerated to the right upper extremity.  She will continue to receive vancomycin until the cultures return.  At that time we will likely transition to her oral medication and potentially discharge her home.  She will be on Lovenox for DVT prophylaxis while in the hospital and discharged on no DVT prophylaxis.  I was present and performed the entire surgery.  Patrecia Pace, PA-C did assist me throughout the case. An assistant was necessary given the difficulty in approach, maintenance of reduction and ability to instrument the fracture.   Katha Hamming, MD [facility]

## 2020-10-02 NOTE — Anesthesia Preprocedure Evaluation (Addendum)
Anesthesia Evaluation  Patient identified by MRN, date of birth, ID band Patient awake    Reviewed: Allergy & Precautions, H&P , NPO status , Patient's Chart, lab work & pertinent test results, reviewed documented beta blocker date and time   Airway Mallampati: III  TM Distance: >3 FB Neck ROM: Full    Dental  (+) Upper Dentures, Lower Dentures   Pulmonary shortness of breath, asthma , sleep apnea , COPD,  COPD inhaler and oxygen dependent, former smoker,    breath sounds clear to auscultation + decreased breath sounds      Cardiovascular hypertension, Pt. on medications and Pt. on home beta blockers  Rhythm:Regular Rate:Normal     Neuro/Psych  Headaches, Anxiety    GI/Hepatic negative GI ROS, Neg liver ROS,   Endo/Other  Hypothyroidism   Renal/GU negative Renal ROS  negative genitourinary   Musculoskeletal   Abdominal   Peds  Hematology negative hematology ROS (+)   Anesthesia Other Findings   Reproductive/Obstetrics negative OB ROS                            Anesthesia Physical  Anesthesia Plan  ASA: III  Anesthesia Plan: General   Post-op Pain Management:    Induction: Intravenous  PONV Risk Score and Plan: 4 or greater and Ondansetron, Dexamethasone and Treatment may vary due to age or medical condition  Airway Management Planned: LMA and Oral ETT  Additional Equipment: None  Intra-op Plan:   Post-operative Plan: Extubation in OR  Informed Consent: I have reviewed the patients History and Physical, chart, labs and discussed the procedure including the risks, benefits and alternatives for the proposed anesthesia with the patient or authorized representative who has indicated his/her understanding and acceptance.     Dental advisory given  Plan Discussed with: CRNA  Anesthesia Plan Comments: (Discussed post op block.)       Anesthesia Quick Evaluation

## 2020-10-02 NOTE — Plan of Care (Signed)
  Problem: Education: Goal: Knowledge of General Education information will improve Description: Including pain rating scale, medication(s)/side effects and non-pharmacologic comfort measures Outcome: Progressing   Problem: Clinical Measurements: Goal: Will remain free from infection Outcome: Progressing Goal: Respiratory complications will improve Outcome: Progressing   Problem: Activity: Goal: Risk for activity intolerance will decrease Outcome: Progressing   

## 2020-10-02 NOTE — Transfer of Care (Signed)
Immediate Anesthesia Transfer of Care Note  Patient: Holly [name]  Procedure(s) Performed: HARDWARE REMOVAL RIGHT ELBOW (Right Elbow)  Patient Location: PACU  Anesthesia Type:General  Level of Consciousness: drowsy and patient cooperative  Airway & Oxygen Therapy: Patient Spontanous Breathing and Patient connected to face mask oxygen  Post-op Assessment: Report given to RN and Post -op Vital signs reviewed and stable  Post vital signs: Reviewed and stable  Last Vitals:  Vitals Value Taken Time  BP 138/52 10/02/20 1006  Temp 36.5 C 10/02/20 1006  Pulse 71 10/02/20 1007  Resp 16 10/02/20 1007  SpO2 98 % 10/02/20 1007  Vitals shown include unvalidated device data.  Last Pain:  Vitals:   10/02/20 0801  TempSrc:   PainSc: 0-No pain      Patients Stated Pain Goal: 3 (69/45/03 8882)  Complications: No complications documented.

## 2020-10-02 NOTE — Anesthesia Procedure Notes (Signed)
Procedure Name: LMA Insertion Date/Time: 10/02/2020 9:01 AM Performed by: Lance Coon, CRNA Patient Re-evaluated:Patient Re-evaluated prior to induction Oxygen Delivery Method: Circle system utilized Preoxygenation: Pre-oxygenation with 100% oxygen LMA: LMA inserted LMA Size: 3.0 Number of attempts: 1 Placement Confirmation: positive ETCO2 and breath sounds checked- equal and bilateral Tube secured with: Tape Dental Injury: Teeth and Oropharynx as per pre-operative assessment

## 2020-10-02 NOTE — Progress Notes (Addendum)
Pharmacy Antibiotic Note  Holly [name] is a 74 y.o. female admitted on 10/01/2020 with wound infection. Pharmacy has been consulted for vancomycin dosing.  OR today with purulent material around olecranon bursa, hardware removed. No overt signs of any osteomyelitis in the olecranon. Vancomycin until cultures final per ortho. Afebrile, WBC wnl. ClCr ~82ml/min.   Plan: Vancomycin 1250mg  Q24hr (Trough goal 10- 15) Monitor cultures, clinical status, renal fx, vanc levels Narrow abx as able and f/u duration   Height: 4\' 11"  (149.9 cm) Weight: 77.1 kg (170 lb) IBW/kg (Calculated) : 43.2  Temp (24hrs), Avg:98.3 F (36.8 C), Min:97.7 F (36.5 C), Max:98.6 F (37 C)  Recent Labs  Lab 10/01/20 2219  WBC 6.3  CREATININE 0.95    Estimated Creatinine Clearance: 45.9 mL/min (by C-G formula based on SCr of 0.95 mg/dL).    Allergies  Allergen Reactions  . Codeine Nausea And Vomiting  . Oxycodone-Acetaminophen Nausea And Vomiting    Antimicrobials this admission: Vanc 12/17 >>     Microbiology results: 12/17 OR tissue cx pend    Thank you for allowing pharmacy to be a part of this patient's care.  Benetta Spar, PharmD, BCPS, BCCP Clinical Pharmacist  Please check AMION for all [place] phone numbers After 10:00 PM, call [place] [phone]

## 2020-10-02 NOTE — H&P (Signed)
Orthopaedic Trauma Service (OTS) Consult   Patient ID: Holly [name] MRN: [id number] DOB/AGE: 75-Mar-1947 75 y.o.  Reason for Surgery: R elbow infection  HPI: Holly [name] is an 74 y.o. female who was admitted from [facility] for right elbow infection. The patient developed a wound and drainage of her right elbow starting in the fall 2021. She underwent open reduction internal fixation of her right olecranon fracture in February 2019. I had performed the initial surgery. Subsequently she was doing fine until she developed a boil bubble over her elbow that eventually opened up and started draining. She was seen at the [facility] practice and subsequently eventually referred to me last week. I saw her which I that point the wound looked benign and we plan for hardware removal this week. She developed altered mental status and confusion and presented to [facility]. She was found to be bacteremic with MRSA in her blood. She was also found to have MRSA in her urine but no urinary symptoms or significant pulmonary symptoms. It was felt to be coming from her elbow. She was started on vancomycin. Her mental status improved. He is she is currently not febrile and she currently does not have a leukocytosis. She was subsequently transferred to [facility] for removal of hardware and I&D of her elbow. Patient was seen this morning at bedside. Currently comfortable. She is nervous about the surgery. No major issues of note. Denies any fevers or chills.  Past Medical History:  Diagnosis Date  . Anxiety   . Asthma   . Basal cell carcinoma   . Bruises easily   . Cataract   . Cervicalgia   . 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 Dyspnea    uses O2 only when needed. Uses 2 L   . Frequency of urination   . Headache   . 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 Hypercalcemia   . Hypertension   . Hypothyroidism   . Mixed hyperlipidemia   . Pneumonia    . Pulmonary nodule   . Sleep apnea    does not wear cpap  . Thyroid neoplasm   . Urinary incontinence   . Wears dentures   . Wears glasses     Past Surgical History:  Procedure Laterality Date  . COLONOSCOPY    . DENTAL SURGERY    . ELBOW ARTHROPLASTY Right 2020  . EYE SURGERY Right    cataract surgery  . FOOT SURGERY  30 YRS AGO   BILATERAL  . ORIF ELBOW FRACTURE Right 12/06/2017   Procedure: OPEN REDUCTION INTERNAL FIXATION (ORIF) ELBOW/OLECRANON FRACTURE;  Surgeon: Shona Needles, MD;  Location: [place];  Service: Orthopedics;  Laterality: Right;  . SKIN CANCER EXCISION    . THYROIDECTOMY N/A 07/31/2014   Procedure: TOTAL THYROIDECTOMY;  Surgeon: Armandina Gemma, MD;  Location: [facility];  Service: General;  Laterality: N/A;  . VAGINAL HYSTERECTOMY    . VIDEO BRONCHOSCOPY WITH ENDOBRONCHIAL NAVIGATION Bilateral 06/25/2019   Procedure: VIDEO BRONCHOSCOPY WITH ENDOBRONCHIAL NAVIGATION;  Surgeon: Lajuana Matte, MD;  Location: [facility] OR;  Service: Thoracic;  Laterality: Bilateral;    Family History  Problem Relation Age of Onset  . Coronary artery disease Father   . Hypertension Father   . Asthma Father   . Allergies Father   . Hypertension Mother   . Lung cancer Mother     Social History:  reports that she  quit smoking about 5 years ago. Her smoking use included cigarettes. She has a 30.00 pack-year smoking history. She has never used smokeless tobacco. She reports current alcohol use. She reports that she does not use drugs.  Allergies:  Allergies  Allergen Reactions  . Codeine Nausea And Vomiting  . Oxycodone-Acetaminophen Nausea And Vomiting    Medications:  No current facility-administered medications on file prior to encounter.   Current Outpatient Medications on File Prior to Encounter  Medication Sig Dispense Refill  . ALPRAZolam (XANAX) 0.5 MG tablet Take 0.5 mg by mouth at bedtime.    Marland Kitchen aspirin EC 81 MG tablet Take 81 mg by mouth daily. Swallow whole.    Marland Kitchen  atorvastatin (LIPITOR) 20 MG tablet Take 20 mg by mouth at bedtime.     Marland Kitchen buPROPion (WELLBUTRIN XL) 300 MG 24 hr tablet Take 300 mg by mouth daily.    . busPIRone (BUSPAR) 30 MG tablet Take 30 mg by mouth 2 (two) times daily.    Marland Kitchen Dextromethorphan-guaiFENesin (MUCINEX DM MAXIMUM STRENGTH PO) Take 15 mLs by mouth every 4 (four) hours as needed (cough/cold symptoms).    . Fluticasone-Umeclidin-Vilant 100-62.5-25 MCG/INH AEPB Inhale 1 puff into the lungs daily.    Marland Kitchen ipratropium-albuterol (DUONEB) 0.5-2.5 (3) MG/3ML SOLN Inhale 3 mLs into the lungs every 4 (four) hours as needed (respiratory issues.).    Marland Kitchen levothyroxine (SYNTHROID) 137 MCG tablet Take 137 mcg by mouth daily before breakfast.    . metoprolol succinate (TOPROL-XL) 50 MG 24 hr tablet Take 50 mg by mouth daily. Take with or immediately following a meal.    . predniSONE (DELTASONE) 20 MG tablet Take 20 mg by mouth daily with breakfast.    . valsartan-hydrochlorothiazide (DIOVAN-HCT) 160-12.5 MG tablet Take 1 tablet by mouth daily.    . VENTOLIN HFA 108 (90 Base) MCG/ACT inhaler INHALE 2 PUFFS INTO THE LUNGS EVERY 4 HOURS AS NEEDED FOR WHEEZING ORSHORTNESS OF BREATH (Patient taking differently: Inhale 2 puffs into the lungs every 4 (four) hours as needed for wheezing or shortness of breath.) 8 g 0    ROS: Constitutional: No fever or chills Vision: No changes in vision ENT: No difficulty swallowing CV: No chest pain Pulm: No SOB or wheezing GI: No nausea or vomiting GU: No urgency or inability to hold urine Skin: No poor wound healing Neurologic: No numbness or tingling Psychiatric: No depression or anxiety Heme: No bruising Allergic: No reaction to medications or food   Exam: Blood pressure 136/66, pulse 81, temperature 98.6 F (37 C), temperature source Axillary, resp. rate 16, SpO2 94 %. General: No acute distress Orientation: Awake alert and oriented x3 Mood and Affect: Cooperative and pleasant Gait: Within normal  limits Coordination and balance: Within normal limits  Right upper extremity: Dressing taken down. There is notable purulence that is flowing from the previous wound. There is pocket of fluctuance around the elbow as well. She is neurovascularly intact. She does have elbow range of motion however it is somewhat painful for her. No significant erythema. No instability noted about the shoulder or wrist.  Left upper extremity: Skin without lesions. No tenderness to palpation. Full painless ROM, full strength in each muscle groups without evidence of instability.   Medical Decision Making: Data: Imaging: X-rays that were performed performed in office yesterday show a healed olecranon fracture without any signs of any hardware failure or loosening no significant signs of any osteomyelitis  Labs:  Results for orders placed or performed during the hospital encounter  of 10/01/20 (from the past 24 hour(s))  CBC     Status: Abnormal   Collection Time: 10/01/20 10:19 PM  Result Value Ref Range   WBC 6.3 4.0 - 10.5 K/uL   RBC 3.14 (L) 3.87 - 5.11 MIL/uL   Hemoglobin 9.5 (L) 12.0 - 15.0 g/dL   HCT 31.5 (L) 36.0 - 46.0 %   MCV 100.3 (H) 80.0 - 100.0 fL   MCH 30.3 26.0 - 34.0 pg   MCHC 30.2 30.0 - 36.0 g/dL   RDW 15.4 11.5 - 15.5 %   Platelets 140 (L) 150 - 400 K/uL   nRBC 0.0 0.0 - 0.2 %  Comprehensive metabolic panel     Status: Abnormal   Collection Time: 10/01/20 10:19 PM  Result Value Ref Range   Sodium 136 135 - 145 mmol/L   Potassium 3.3 (L) 3.5 - 5.1 mmol/L   Chloride 89 (L) 98 - 111 mmol/L   CO2 37 (H) 22 - 32 mmol/L   Glucose, Bld 113 (H) 70 - 99 mg/dL   BUN 12 8 - 23 mg/dL   Creatinine, Ser 0.95 0.44 - 1.00 mg/dL   Calcium 9.1 8.9 - 10.3 mg/dL   Total Protein 5.5 (L) 6.5 - 8.1 g/dL   Albumin 2.3 (L) 3.5 - 5.0 g/dL   AST 20 15 - 41 U/L   ALT 18 0 - 44 U/L   Alkaline Phosphatase 131 (H) 38 - 126 U/L   Total Bilirubin 0.4 0.3 - 1.2 mg/dL   GFR, Estimated >60 >60 mL/min   Anion  gap 10 5 - 15    Imaging or Labs ordered: None  Medical history and chart was reviewed and case discussed with medical provider.  Assessment/Plan: 74 year old female with septic olecranon bursitis status post ORIF of olecranon fracture  We will plan to perform I&D and removal of hardware today. Risks and benefits were discussed with the patient. She agrees to proceed with surgery and consent was obtained. We will continue IV vancomycin postoperatively. We will take cultures intraoperatively and depending on intraoperative findings we will determine whether she will need a course of IV antibiotics or oral antibiotics.  Shona Needles, MD [facility] [phone] (office) [email or web address]

## 2020-10-03 LAB — CBC
HCT: 27.7 % — ABNORMAL LOW (ref 36.0–46.0)
Hemoglobin: 8.2 g/dL — ABNORMAL LOW (ref 12.0–15.0)
MCH: 30.3 pg (ref 26.0–34.0)
MCHC: 29.6 g/dL — ABNORMAL LOW (ref 30.0–36.0)
MCV: 102.2 fL — ABNORMAL HIGH (ref 80.0–100.0)
Platelets: 144 10*3/uL — ABNORMAL LOW (ref 150–400)
RBC: 2.71 MIL/uL — ABNORMAL LOW (ref 3.87–5.11)
RDW: 15.2 % (ref 11.5–15.5)
WBC: 7.2 10*3/uL (ref 4.0–10.5)
nRBC: 0 % (ref 0.0–0.2)

## 2020-10-03 LAB — BASIC METABOLIC PANEL
Anion gap: 10 (ref 5–15)
BUN: 15 mg/dL (ref 8–23)
CO2: 34 mmol/L — ABNORMAL HIGH (ref 22–32)
Calcium: 9.4 mg/dL (ref 8.9–10.3)
Chloride: 96 mmol/L — ABNORMAL LOW (ref 98–111)
Creatinine, Ser: 0.8 mg/dL (ref 0.44–1.00)
GFR, Estimated: >60 mL/min (ref >60)
Glucose, Bld: 242 mg/dL — ABNORMAL HIGH (ref 70–99)
Potassium: 4.5 mmol/L (ref 3.5–5.1)
Sodium: 140 mmol/L (ref 135–145)

## 2020-10-03 LAB — VITAMIN D 25 HYDROXY (VIT D DEFICIENCY, FRACTURES): Vit D, 25-Hydroxy: 20.56 ng/mL — ABNORMAL LOW (ref 30–100)

## 2020-10-03 NOTE — Anesthesia Postprocedure Evaluation (Signed)
Anesthesia Post Note  Patient: Holly [name]  Procedure(s) Performed: HARDWARE REMOVAL RIGHT ELBOW (Right Elbow)     Patient location during evaluation: PACU Anesthesia Type: General Level of consciousness: sedated and patient cooperative Pain management: pain level controlled Vital Signs Assessment: post-procedure vital signs reviewed and stable Respiratory status: spontaneous breathing Cardiovascular status: stable Anesthetic complications: no   No complications documented.  Last Vitals:  Vitals:   10/03/20 1517 10/03/20 2011  BP: (!) 125/56 131/61  Pulse: 63 72  Resp: 14 18  Temp: 36.4 C (!) 36.4 C  SpO2: 100% 100%    Last Pain:  Vitals:   10/03/20 2011  TempSrc: Oral  PainSc:                  [name]

## 2020-10-03 NOTE — Progress Notes (Signed)
Occupational Therapy Evaluation Patient Details Name: Holly [name] MRN: [id number] DOB: 08-12-1946 Today's Date: 10/03/2020    History of Present Illness Holly [name] is an 74 y.o. female who was admitted to [facility] from [facility] with septic olecranon bursitis status post ORIF of olecranon fracture. PMH Asthma, cataract, HTN, hypercalcemia, PNA, sleep apnea, basal cell carcinoma, and COPD.   Clinical Impression   PTA pt PLOF living at home with family,, requiring assistance as needed with ADLs and IADLs with use of AE. Pt currently limited with safe ADL engagement due to instability and decreased tolerance. Pt reports limited support in home environment due to families work schedule, however, friend and pt reports family helps as much as possible. DC recommendation to [facility] only if caregiver support is available for safety in home environment. Pt will benefit from continued acute OT to address established deficits to maximize independence prior to dc.      Follow Up Recommendations  Home health OT;Supervision - Intermittent    Equipment Recommendations  3 in 1 bedside commode (RW due to rollator in home environment)    Recommendations for Other Services       Precautions / Restrictions Precautions Precautions: None Restrictions Weight Bearing Restrictions: Yes RUE Weight Bearing: Weight bearing as tolerated      Mobility Bed Mobility               General bed mobility comments: received sitting EOB with friend    Transfers Overall transfer level: Needs assistance Equipment used: Rolling walker (2 wheeled) Transfers: Sit to/from Stand Sit to Stand: Min assist         General transfer comment: mostly for safety, vcs for hand placement. Limited with safe RW management, reports use of rollator at home.    Balance Overall balance assessment: Needs assistance Sitting-balance support: Feet supported Sitting balance-Leahy Scale: Fair     Standing balance support:  Bilateral upper extremity supported Standing balance-Leahy Scale: Poor Standing balance comment: reliant of UE support with RW.                           ADL either performed or assessed with clinical judgement   ADL Overall ADL's : Needs assistance/impaired Eating/Feeding: Set up;Sitting   Grooming: Wash/dry face;Oral care;Brushing hair;Set up;Sitting   Upper Body Bathing: Minimal assistance;Sitting   Lower Body Bathing: Minimal assistance;Sitting/lateral leans   Upper Body Dressing : Minimal assistance;Sitting   Lower Body Dressing: Set up Lower Body Dressing Details (indicate cue type and reason): pt able to demonstrate figure 4 position to Holly/doff sock Toilet Transfer: Minimal assistance;RW Toilet Transfer Details (indicate cue type and reason): simualted toilet transfer from bed <> bed with sit to stand side step transititioning.           General ADL Comments: pt presents with instability and concerns of falls in home environment.     Vision         Perception     Praxis      Pertinent Vitals/Pain Pain Assessment: No/denies pain     Hand Dominance Right   Extremity/Trunk Assessment Upper Extremity Assessment Upper Extremity Assessment: RUE deficits/detail RUE Deficits / Details: R elbow fx s/p bursitis WBAT RUE Sensation: WNL RUE Coordination: WNL   Lower Extremity Assessment Lower Extremity Assessment: Defer to PT evaluation       Communication Communication Communication: No difficulties   Cognition Arousal/Alertness: Awake/alert Behavior During Therapy: Impulsive Overall Cognitive Status: Impaired/Different from baseline Area  of Impairment: Orientation;Attention;Safety/judgement;Awareness                 Orientation Level: Place;Time (reports being in [place])       Safety/Judgement: Decreased awareness of safety Awareness: Emergent;Intellectual   General Comments: impulsive, friend at bedside reports pt has  demonstrated increased confusion and different from baseline.   General Comments       Exercises     Shoulder Instructions      Home Living Family/patient expects to be discharged to:: Private residence Living Arrangements: Children Available Help at Discharge: Family Type of Home: Mobile home Home Access: Stairs to enter Entrance Stairs-Number of Steps: 4   Home Layout: One level     Bathroom Shower/Tub: Teacher, early years/pre: Handicapped height     Home Equipment: Clinical cytogeneticist - 2 wheels;Cane - single point;Toilet riser   Additional Comments: reports looking into getting a ramp and possible renovation for walkin shower, reports daughter and daughters bf avaible to assist. Daughter bf available during the day and daughter evenings and nights.      Prior Functioning/Environment Level of Independence: Needs assistance  Gait / Transfers Assistance Needed: reports use of rollator, but has reported 3 falls PTA ADL's / Homemaking Assistance Needed: assistance with ADLs and IADLs.            OT Problem List: Decreased range of motion;Decreased activity tolerance;Impaired balance (sitting and/or standing);Decreased safety awareness;Decreased knowledge of use of DME or AE;Decreased knowledge of precautions;Pain      OT Treatment/Interventions: Self-care/ADL training;Therapeutic exercise;Energy conservation;DME and/or AE instruction;Therapeutic activities;Patient/family education;Balance training    OT Goals(Current goals can be found in the care plan section) Acute Rehab OT Goals Patient Stated Goal: to return home OT Goal Formulation: With patient/family Time For Goal Achievement: 10/03/20 Potential to Achieve Goals: Fair  OT Frequency: Min 3X/week   Barriers to D/C: Inaccessible home environment          Co-evaluation              AM-PAC OT "6 Clicks" Daily Activity     Outcome Measure Help from another person eating meals?: A Little Help  from another person taking care of personal grooming?: A Little Help from another person toileting, which includes using toliet, bedpan, or urinal?: A Little Help from another person bathing (including washing, rinsing, drying)?: A Little Help from another person to put on and taking off regular upper body clothing?: A Little Help from another person to put on and taking off regular lower body clothing?: A Little 6 Click Score: 18   End of Session Equipment Utilized During Treatment: Rolling walker Nurse Communication: Mobility status  Activity Tolerance: Patient tolerated treatment well Patient left: in bed;with bed alarm set;with call bell/phone within reach;with nursing/sitter in room (sitting up EOB with friend)  OT Visit Diagnosis: Unsteadiness on feet (R26.81);Repeated falls (R29.6);Pain Pain - Right/Left: Right Pain - part of body: Arm                Time: 1329-1400 OT Time Calculation (min): 31 min Charges:  OT General Charges $OT Visit: 1 Visit OT Evaluation $OT Eval Low Complexity: 1 Low OT Treatments $Self Care/Home Management : 8-22 mins  Minus Breeding, MSOT, OTR/L  [facility]  [phone]   Marius Ditch 10/03/2020, 3:05 PM

## 2020-10-03 NOTE — Progress Notes (Signed)
Subjective: 1 Day Post-Op s/p Procedure(s): HARDWARE REMOVAL RIGHT ELBOW   Patient is alert, oriented, able to have coherent conversation with me. Denies any pain. Denies chest pain, Calf pain. No nausea/vomiting. No other complaints.  Objective:  PE: VITALS:   Vitals:   10/02/20 2004 10/03/20 0355 10/03/20 0733 10/03/20 0752  BP: (!) 129/55 (!) 144/90  115/84  Pulse: 73 65  71  Resp: 17 15  14   Temp: 98.9 F (37.2 C) 98.7 F (37.1 C)  (!) 97.3 F (36.3 C)  TempSrc:    Oral  SpO2: 98% 100% 98% 98%  Weight:      Height:       General: alert, oriented x 3, in no acute distress GI: abdomen soft, nontender MSK: RUE - dressing intact. Full ROM at right hand, able to flex, extend, and abduct all fingers of right hand. Able to make a fist. Patient using RUE to push up in bed without pain. 2+ radial pulse. Sensation intact to all aspects of hand and forearm.   LABS  Results for orders placed or performed during the hospital encounter of 10/01/20 (from the past 24 hour(s))  Aerobic/Anaerobic Culture (surgical/deep wound)     Status: None (Preliminary result)   Collection Time: 10/02/20  9:20 AM   Specimen: Soft Tissue, Other  Result Value Ref Range   Specimen Description TISSUE    Special Requests SOFT TISSUE    Gram Stain      ABUNDANT WBC PRESENT, PREDOMINANTLY PMN NO ORGANISMS SEEN Performed at [facility], [place] [address] [place], [place] [address]    Culture PENDING    Report Status PENDING   VITAMIN D 25 Hydroxy (Vit-D Deficiency, Fractures)     Status: Abnormal   Collection Time: 10/03/20  1:56 AM  Result Value Ref Range   Vit D, 25-Hydroxy 20.56 (L) 30 - 100 ng/mL    DG Elbow 2 Views Right  Result Date: 10/02/2020 CLINICAL DATA:  Hardware removal EXAM: RIGHT ELBOW - 2 VIEW; DG C-ARM 1-60 MIN COMPARISON:  09/29/2020 FLUOROSCOPY TIME:  Fluoroscopy Time:  3 seconds Radiation Exposure Index (if provided by the fluoroscopic device): 0.14 mGy Number of  Acquired Spot Images: 1 FINDINGS: Interval removal of fixation sideplate and multiple fixation screws in the proximal ulna is noted. Persistent calcification is noted in the posterior soft tissues just above the olecranon. IMPRESSION: Interval hardware removal. Electronically Signed   By: Inez Catalina M.D.   On: 10/02/2020 09:54   DG C-Arm 1-60 Min  Result Date: 10/02/2020 CLINICAL DATA:  Hardware removal EXAM: RIGHT ELBOW - 2 VIEW; DG C-ARM 1-60 MIN COMPARISON:  09/29/2020 FLUOROSCOPY TIME:  Fluoroscopy Time:  3 seconds Radiation Exposure Index (if provided by the fluoroscopic device): 0.14 mGy Number of Acquired Spot Images: 1 FINDINGS: Interval removal of fixation sideplate and multiple fixation screws in the proximal ulna is noted. Persistent calcification is noted in the posterior soft tissues just above the olecranon. IMPRESSION: Interval hardware removal. Electronically Signed   By: Inez Catalina M.D.   On: 10/02/2020 09:54    Assessment/Plan: Principal Problem:   Closed fracture of right olecranon process Active Problems:   Ulna, olecranon process fracture, right, closed, with routine healing, subsequent encounter   Olecranon bursitis of right elbow - Post op Day 1 - right olecranon removal of hardware, excision of olecranon septic bursitis - overall doing well, repeat CBC and BMP ordered - wound culture results still pending, will look out for  these today, plan is to keep her on vancomycin until culture results are back and discharge on PO antibiotics depending on results Weightbearing: WBAT RUE Insicional and dressing care: Reinforce dressings as needed VTE prophylaxis: lovenox while in hospital, will not be discharged on DVT prophylaxis Pain control: tramadol 50-100mg  q 6 hours prn, tylenol 325-650 q 6 hours prn, robaxin 500mg  q 6 hours prn for muscle spasm Follow - up plan: with Dr. Doreatha Martin Dispo: awaiting culture results  Contact information:   Weekdays 8-5 Merlene Pulling, PA-C  [phone] A fter hours and holidays please check [email or web address] for group call information for [facility]  Ventura Bruns 10/03/2020, 8:07 AM

## 2020-10-04 LAB — BASIC METABOLIC PANEL
Anion gap: 9 (ref 5–15)
BUN: 15 mg/dL (ref 8–23)
CO2: 34 mmol/L — ABNORMAL HIGH (ref 22–32)
Calcium: 9.7 mg/dL (ref 8.9–10.3)
Chloride: 99 mmol/L (ref 98–111)
Creatinine, Ser: 0.88 mg/dL (ref 0.44–1.00)
GFR, Estimated: >60 mL/min (ref >60)
Glucose, Bld: 122 mg/dL — ABNORMAL HIGH (ref 70–99)
Potassium: 4.5 mmol/L (ref 3.5–5.1)
Sodium: 142 mmol/L (ref 135–145)

## 2020-10-04 LAB — CBC
HCT: 29.4 % — ABNORMAL LOW (ref 36.0–46.0)
Hemoglobin: 8.7 g/dL — ABNORMAL LOW (ref 12.0–15.0)
MCH: 30.7 pg (ref 26.0–34.0)
MCHC: 29.6 g/dL — ABNORMAL LOW (ref 30.0–36.0)
MCV: 103.9 fL — ABNORMAL HIGH (ref 80.0–100.0)
Platelets: 159 10*3/uL (ref 150–400)
RBC: 2.83 MIL/uL — ABNORMAL LOW (ref 3.87–5.11)
RDW: 15 % (ref 11.5–15.5)
WBC: 8 10*3/uL (ref 4.0–10.5)
nRBC: 0 % (ref 0.0–0.2)

## 2020-10-04 MED ORDER — VITAMIN D 25 MCG (1000 UNIT) PO TABS
2000.0000 [IU] | ORAL_TABLET | Freq: Every day | ORAL | Status: DC
  Administered 2020-10-04: 2000 [IU] via ORAL
  Filled 2020-10-04: qty 2

## 2020-10-04 MED ORDER — VITAMIN D3 25 MCG PO TABS: 2000.0000 [IU] | ORAL_TABLET | Freq: Every day | ORAL | 1 refills | Status: DC

## 2020-10-04 MED ORDER — SULFAMETHOXAZOLE-TRIMETHOPRIM 800-160 MG PO TABS: 1.0000 | ORAL_TABLET | Freq: Two times a day (BID) | ORAL | 0 refills | Status: AC

## 2020-10-04 NOTE — Discharge Instructions (Signed)
[facility] Discharge Instructions   General Discharge Instructions  WEIGHT BEARING STATUS: weightbearing as tolerated right upper extremity  RANGE OF MOTION/ACTIVITY: Ok for elbow motion as tolerated  Wound Care: You may remove your surgical dressing on Monday 10/05/20. Incisions can be left open to air if there is no drainage. If incision continues to have drainage, follow wound care instructions below. Okay to shower if no drainage from incisions.  DVT/PE prophylaxis: None  Diet: as you were eating previously.  Can use over the counter stool softeners and bowel preparations, such as Miralax, to help with bowel movements.  Narcotics can be constipating.  Be sure to drink plenty of fluids  PAIN MEDICATION USE AND EXPECTATIONS  You have likely been given narcotic medications to help control your pain.  After a traumatic event that results in an fracture (broken bone) with or without surgery, it is ok to use narcotic pain medications to help control one's pain.  We understand that everyone responds to pain differently and each individual patient will be evaluated on a regular basis for the continued need for narcotic medications. Ideally, narcotic medication use should last no more than 6-8 weeks (coinciding with fracture healing).   As a patient it is your responsibility as well to monitor narcotic medication use and report the amount and frequency you use these medications when you come to your office visit.   We would also advise that if you are using narcotic medications, you should take a dose prior to therapy to maximize you participation.  IF YOU ARE ON NARCOTIC MEDICATIONS IT IS NOT PERMISSIBLE TO OPERATE A MOTOR VEHICLE (MOTORCYCLE/CAR/TRUCK/MOPED) OR HEAVY MACHINERY DO NOT MIX NARCOTICS WITH OTHER CNS (CENTRAL NERVOUS SYSTEM) DEPRESSANTS SUCH AS ALCOHOL   STOP SMOKING OR USING NICOTINE PRODUCTS!!!!  As discussed nicotine severely impairs your body's ability to  heal surgical and traumatic wounds but also impairs bone healing.  Wounds and bone heal by forming microscopic blood vessels (angiogenesis) and nicotine is a vasoconstrictor (essentially, shrinks blood vessels).  Therefore, if vasoconstriction occurs to these microscopic blood vessels they essentially disappear and are unable to deliver necessary nutrients to the healing tissue.  This is one modifiable factor that you can do to dramatically increase your chances of healing your injury.    (This means no smoking, no nicotine gum, patches, etc)  DO NOT USE NONSTEROIDAL ANTI-INFLAMMATORY DRUGS (NSAID'S)  Using products such as Advil (ibuprofen), Aleve (naproxen), Motrin (ibuprofen) for additional pain control during fracture healing can delay and/or prevent the healing response.  If you would like to take over the counter (OTC) medication, Tylenol (acetaminophen) is ok.  However, some narcotic medications that are given for pain control contain acetaminophen as well. Therefore, you should not exceed more than 4000 mg of tylenol in a day if you do not have liver disease.  Also note that there are may OTC medicines, such as cold medicines and allergy medicines that my contain tylenol as well.  If you have any questions about medications and/or interactions please ask your doctor/PA or your pharmacist.      ICE AND ELEVATE INJURED/OPERATIVE EXTREMITY  Using ice and elevating the injured extremity above your heart can help with swelling and pain control.  Icing in a pulsatile fashion, such as 20 minutes on and 20 minutes off, can be followed.    Do not place ice directly on skin. Make sure there is a barrier between to skin and the ice pack.    Using  frozen items such as frozen peas works well as the conform nicely to the are that needs to be iced.  USE AN ACE WRAP OR TED HOSE FOR SWELLING CONTROL  In addition to icing and elevation, Ace wraps or TED hose are used to help limit and resolve swelling.  It is  recommended to use Ace wraps or TED hose until you are informed to stop.    When using Ace Wraps start the wrapping distally (farthest away from the body) and wrap proximally (closer to the body)   Example: If you had surgery on your leg or thing and you do not have a splint on, start the ace wrap at the toes and work your way up to the thigh        If you had surgery on your upper extremity and do not have a splint on, start the ace wrap at your fingers and work your way up to the upper arm   [place]: [phone]   VISIT OUR WEBSITE FOR ADDITIONAL INFORMATION: [email or web address]    Discharge Wound Care Instructions  Do NOT apply any ointments, solutions or lotions to pin sites or surgical wounds.  These prevent needed drainage and even though solutions like hydrogen peroxide kill bacteria, they also damage cells lining the pin sites that help fight infection.  Applying lotions or ointments can keep the wounds moist and can cause them to breakdown and open up as well. This can increase the risk for infection. When in doubt call the office.  Surgical incisions should be dressed daily.  If any drainage is noted, use one layer of adaptic, then gauze, Kerlix, and an ace wrap.  Once the incision is completely dry and without drainage, it may be left open to air out.  Showering may begin 36-48 hours later.  Cleaning gently with soap and water.  Traumatic wounds should be dressed daily as well.    One layer of adaptic, gauze, Kerlix, then ace wrap.  The adaptic can be discontinued once the draining has ceased    If you have a wet to dry dressing: wet the gauze with saline the squeeze as much saline out so the gauze is moist (not soaking wet), place moistened gauze over wound, then place a dry gauze over the moist one, followed by Kerlix wrap, then ace wrap.

## 2020-10-04 NOTE — Progress Notes (Signed)
Subjective: 2 Days Post-Op s/p Procedure(s): HARDWARE REMOVAL RIGHT ELBOW  Patient sitting up comfortably in bed. No complaints overnight. No pain at right elbow. Patient has had some shortness of breath when nasal canula falls off, but this resolves with nasal canula. Denies CP, nausea, vomiting, abdominal pain.  Objective:  PE: VITALS:   Vitals:   10/03/20 1517 10/03/20 2011 10/04/20 0515 10/04/20 0816  BP: (!) 125/56 131/61 (!) 152/70 135/68  Pulse: 63 72 66 66  Resp: 14 18 17 16   Temp: 97.6 F (36.4 C) (!) 97.5 F (36.4 C) 98.2 F (36.8 C) (!) 97.5 F (36.4 C)  TempSrc: Oral Oral Oral Oral  SpO2: 100% 100% 100% 98%  Weight:      Height:       General: alert, oriented x 3, in no acute distress Resp: on 4 liters of 02, increased work of breathing - resolved with nasal canula adjustment GI: abdomen soft, nontender MSK: RUE - dressing intact,changed today - mild drainage noted on dressings, not actively draining on exam, incision well approximated. Full ROM at right wrist, able to flex, extend, and abduct all fingers of right hand. Able to make a fist. 2+ radial pulse. Sensation intact to all aspects of hand and forearm.   LABS  Results for orders placed or performed during the hospital encounter of 10/01/20 (from the past 24 hour(s))  Basic metabolic panel     Status: Abnormal   Collection Time: 10/03/20  8:42 AM  Result Value Ref Range   Sodium 140 135 - 145 mmol/L   Potassium 4.5 3.5 - 5.1 mmol/L   Chloride 96 (L) 98 - 111 mmol/L   CO2 34 (H) 22 - 32 mmol/L   Glucose, Bld 242 (H) 70 - 99 mg/dL   BUN 15 8 - 23 mg/dL   Creatinine, Ser 0.80 0.44 - 1.00 mg/dL   Calcium 9.4 8.9 - 10.3 mg/dL   GFR, Estimated >60 >60 mL/min   Anion gap 10 5 - 15  CBC     Status: Abnormal   Collection Time: 10/03/20  8:42 AM  Result Value Ref Range   WBC 7.2 4.0 - 10.5 K/uL   RBC 2.71 (L) 3.87 - 5.11 MIL/uL   Hemoglobin 8.2 (L) 12.0 - 15.0 g/dL   HCT 27.7 (L) 36.0 - 46.0 %   MCV  102.2 (H) 80.0 - 100.0 fL   MCH 30.3 26.0 - 34.0 pg   MCHC 29.6 (L) 30.0 - 36.0 g/dL   RDW 15.2 11.5 - 15.5 %   Platelets 144 (L) 150 - 400 K/uL   nRBC 0.0 0.0 - 0.2 %  CBC     Status: Abnormal   Collection Time: 10/04/20  2:06 AM  Result Value Ref Range   WBC 8.0 4.0 - 10.5 K/uL   RBC 2.83 (L) 3.87 - 5.11 MIL/uL   Hemoglobin 8.7 (L) 12.0 - 15.0 g/dL   HCT 29.4 (L) 36.0 - 46.0 %   MCV 103.9 (H) 80.0 - 100.0 fL   MCH 30.7 26.0 - 34.0 pg   MCHC 29.6 (L) 30.0 - 36.0 g/dL   RDW 15.0 11.5 - 15.5 %   Platelets 159 150 - 400 K/uL   nRBC 0.0 0.0 - 0.2 %  Basic metabolic panel     Status: Abnormal   Collection Time: 10/04/20  2:06 AM  Result Value Ref Range   Sodium 142 135 - 145 mmol/L   Potassium 4.5 3.5 - 5.1 mmol/L  Chloride 99 98 - 111 mmol/L   CO2 34 (H) 22 - 32 mmol/L   Glucose, Bld 122 (H) 70 - 99 mg/dL   BUN 15 8 - 23 mg/dL   Creatinine, Ser 0.88 0.44 - 1.00 mg/dL   Calcium 9.7 8.9 - 10.3 mg/dL   GFR, Estimated >60 >60 mL/min   Anion gap 9 5 - 15    DG Elbow 2 Views Right  Result Date: 10/02/2020 CLINICAL DATA:  Hardware removal EXAM: RIGHT ELBOW - 2 VIEW; DG C-ARM 1-60 MIN COMPARISON:  09/29/2020 FLUOROSCOPY TIME:  Fluoroscopy Time:  3 seconds Radiation Exposure Index (if provided by the fluoroscopic device): 0.14 mGy Number of Acquired Spot Images: 1 FINDINGS: Interval removal of fixation sideplate and multiple fixation screws in the proximal ulna is noted. Persistent calcification is noted in the posterior soft tissues just above the olecranon. IMPRESSION: Interval hardware removal. Electronically Signed   By: Inez Catalina M.D.   On: 10/02/2020 09:54   DG C-Arm 1-60 Min  Result Date: 10/02/2020 CLINICAL DATA:  Hardware removal EXAM: RIGHT ELBOW - 2 VIEW; DG C-ARM 1-60 MIN COMPARISON:  09/29/2020 FLUOROSCOPY TIME:  Fluoroscopy Time:  3 seconds Radiation Exposure Index (if provided by the fluoroscopic device): 0.14 mGy Number of Acquired Spot Images: 1 FINDINGS: Interval  removal of fixation sideplate and multiple fixation screws in the proximal ulna is noted. Persistent calcification is noted in the posterior soft tissues just above the olecranon. IMPRESSION: Interval hardware removal. Electronically Signed   By: Inez Catalina M.D.   On: 10/02/2020 09:54    Assessment/Plan: Principal Problem:   Closed fracture of right olecranon process Active Problems:   Ulna, olecranon process fracture, right, closed, with routine healing, subsequent encounter   Olecranon bursitis of right elbow - Post op Day 2 - right olecranon removal of hardware, excision of olecranon septic bursitis - overall doing well - wound culture results still pending, will continue to look out for these today, plan is to keep her on vancomycin until culture results are back and discharge on PO antibiotics depending on results Weightbearing: WBAT RUE Insicional and dressing care: Reinforce dressings as needed, dressing changed this AM VTE prophylaxis: lovenox while in hospital, will not be discharged on DVT prophylaxis Pain control: tramadol 50-100mg  q 6 hours prn, tylenol 325-650 q 6 hours prn, robaxin 500mg  q 6 hours prn for muscle spasm Dispo: awaiting culture results   Holly [name] 10/04/2020, 8:36 AM

## 2020-10-04 NOTE — Discharge Summary (Signed)
[facility]thopaedic Trauma Service (OTS) Discharge Summary   Patient ID: Holly [name] MRN: [id number] DOB/AGE: 1946/03/28 74 y.o.  Admit date: 10/01/2020 Discharge date: 10/04/2020  Admission Diagnoses: 1. Right healed olecranon fracture 2. Septic olecranon bursitis with open wound   Discharge Diagnoses:  Principal Problem:   Closed fracture of right olecranon process Active Problems:   Ulna, olecranon process fracture, right, closed, with routine healing, subsequent encounter   Olecranon bursitis of right elbow   Past Medical History:  Diagnosis Date  . Anxiety   . Asthma   . Basal cell carcinoma   . Bruises easily   . Cataract   . Cervicalgia   . 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 Dyspnea    uses O2 only when needed. Uses 2 L   . Frequency of urination   . Headache   . 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 Hypercalcemia   . Hypertension   . Hypothyroidism   . Mixed hyperlipidemia   . Pneumonia   . Pulmonary nodule   . Sleep apnea    does not wear cpap  . Thyroid neoplasm   . Urinary incontinence   . Wears dentures   . Wears glasses      Procedures Performed: 1. CPT 20680-Removal of hardware right olecranon 2. CPT 24105-Excisions of olecranon septic bursitis   Discharged Condition: good  Hospital Course: Patient was admitted from outside hospital on 10/01/2020 from outside hospital with diagnosis of septic olecranon bursitis with open wound.  Patient was taken to the operating room on 10/02/2020 for the above procedures and tolerated this well without complications.  Intraoperative cultures were obtained.  Patient worked with physical and occupational therapy starting on postoperative day #1.  Was started on Lovenox DVT prophylaxis starting on postoperative day #1.  She remained on vancomycin postoperatively until culture results returned. On 10/04/2020, the patient was tolerating diet, working well with  therapies, pain well controlled, vital signs stable, dressings clean, dry, intact and felt stable for discharge to home. Patient will follow up as below and knows to call with questions or concerns.     Consults: None  Significant Diagnostic Studies:  Results for orders placed or performed during the hospital encounter of 10/01/20 (from the past 168 hour(s))  CBC   Collection Time: 10/01/20 10:19 PM  Result Value Ref Range   WBC 6.3 4.0 - 10.5 K/uL   RBC 3.14 (L) 3.87 - 5.11 MIL/uL   Hemoglobin 9.5 (L) 12.0 - 15.0 g/dL   HCT 31.5 (L) 36.0 - 46.0 %   MCV 100.3 (H) 80.0 - 100.0 fL   MCH 30.3 26.0 - 34.0 pg   MCHC 30.2 30.0 - 36.0 g/dL   RDW 15.4 11.5 - 15.5 %   Platelets 140 (L) 150 - 400 K/uL   nRBC 0.0 0.0 - 0.2 %  Comprehensive metabolic panel   Collection Time: 10/01/20 10:19 PM  Result Value Ref Range   Sodium 136 135 - 145 mmol/L   Potassium 3.3 (L) 3.5 - 5.1 mmol/L   Chloride 89 (L) 98 - 111 mmol/L   CO2 37 (H) 22 - 32 mmol/L   Glucose, Bld 113 (H) 70 - 99 mg/dL   BUN 12 8 - 23 mg/dL   Creatinine, Ser 0.95 0.44 - 1.00 mg/dL   Calcium 9.1 8.9 - 10.3 mg/dL   Total Protein 5.5 (L) 6.5 - 8.1 g/dL   Albumin 2.3 (  L) 3.5 - 5.0 g/dL   AST 20 15 - 41 U/L   ALT 18 0 - 44 U/L   Alkaline Phosphatase 131 (H) 38 - 126 U/L   Total Bilirubin 0.4 0.3 - 1.2 mg/dL   GFR, Estimated >60 >60 mL/min   Anion gap 10 5 - 15  Aerobic/Anaerobic Culture (surgical/deep wound)   Collection Time: 10/02/20  9:20 AM   Specimen: Soft Tissue, Other  Result Value Ref Range   Specimen Description TISSUE    Special Requests SOFT TISSUE    Gram Stain      ABUNDANT WBC PRESENT, PREDOMINANTLY PMN NO ORGANISMS SEEN    Culture      RARE STAPHYLOCOCCUS AUREUS SUSCEPTIBILITIES TO FOLLOW Performed at [facility], [place] [address], [place], [place] [address]    Report Status PENDING   VITAMIN D 25 Hydroxy (Vit-D Deficiency, Fractures)   Collection Time: 10/03/20  1:56 AM  Result Value Ref Range    Vit D, 25-Hydroxy 20.56 (L) 30 - 100 ng/mL  Basic metabolic panel   Collection Time: 10/03/20  8:42 AM  Result Value Ref Range   Sodium 140 135 - 145 mmol/L   Potassium 4.5 3.5 - 5.1 mmol/L   Chloride 96 (L) 98 - 111 mmol/L   CO2 34 (H) 22 - 32 mmol/L   Glucose, Bld 242 (H) 70 - 99 mg/dL   BUN 15 8 - 23 mg/dL   Creatinine, Ser 0.80 0.44 - 1.00 mg/dL   Calcium 9.4 8.9 - 10.3 mg/dL   GFR, Estimated >60 >60 mL/min   Anion gap 10 5 - 15  CBC   Collection Time: 10/03/20  8:42 AM  Result Value Ref Range   WBC 7.2 4.0 - 10.5 K/uL   RBC 2.71 (L) 3.87 - 5.11 MIL/uL   Hemoglobin 8.2 (L) 12.0 - 15.0 g/dL   HCT 27.7 (L) 36.0 - 46.0 %   MCV 102.2 (H) 80.0 - 100.0 fL   MCH 30.3 26.0 - 34.0 pg   MCHC 29.6 (L) 30.0 - 36.0 g/dL   RDW 15.2 11.5 - 15.5 %   Platelets 144 (L) 150 - 400 K/uL   nRBC 0.0 0.0 - 0.2 %  CBC   Collection Time: 10/04/20  2:06 AM  Result Value Ref Range   WBC 8.0 4.0 - 10.5 K/uL   RBC 2.83 (L) 3.87 - 5.11 MIL/uL   Hemoglobin 8.7 (L) 12.0 - 15.0 g/dL   HCT 29.4 (L) 36.0 - 46.0 %   MCV 103.9 (H) 80.0 - 100.0 fL   MCH 30.7 26.0 - 34.0 pg   MCHC 29.6 (L) 30.0 - 36.0 g/dL   RDW 15.0 11.5 - 15.5 %   Platelets 159 150 - 400 K/uL   nRBC 0.0 0.0 - 0.2 %  Basic metabolic panel   Collection Time: 10/04/20  2:06 AM  Result Value Ref Range   Sodium 142 135 - 145 mmol/L   Potassium 4.5 3.5 - 5.1 mmol/L   Chloride 99 98 - 111 mmol/L   CO2 34 (H) 22 - 32 mmol/L   Glucose, Bld 122 (H) 70 - 99 mg/dL   BUN 15 8 - 23 mg/dL   Creatinine, Ser 0.88 0.44 - 1.00 mg/dL   Calcium 9.7 8.9 - 10.3 mg/dL   GFR, Estimated >60 >60 mL/min   Anion gap 9 5 - 15  Results for orders placed or performed during the hospital encounter of 09/28/20 (from the past 168 hour(s))  SARS CORONAVIRUS  2 (TAT 6-24 HRS) Nasopharyngeal Nasopharyngeal Swab   Collection Time: 09/28/20 12:08 PM   Specimen: Nasopharyngeal Swab  Result Value Ref Range   SARS Coronavirus 2 NEGATIVE NEGATIVE     Treatments: IV  hydration, antibiotics: vancomycin, analgesia: acetaminophen, Morphine and tramadol, anticoagulation: LMW heparin, therapies: PT and OT and surgery: As above  Discharge Exam: General: alert, oriented x 3, in no acute distress Resp: on 4 liters of 02, increased work of breathing - resolved with nasal canula adjustment GI: abdomen soft, nontender MSK: RUE - dressing intact,changed today - mild drainage noted on dressings, not actively draining on exam, incision well approximated. Full ROM at right wrist, able to flex, extend, and abduct all fingers of right hand. Able to make a fist. 2+ radial pulse. Sensation intact to all aspects of hand and forearm.  Disposition: Discharge disposition: 01-Home or Self Care       Discharge Instructions    Call MD / Call 911   Complete by: As directed    If you experience chest pain or shortness of breath, CALL 911 and be transported to the hospital emergency room.  If you develope a fever above 101 F, pus (white drainage) or increased drainage or redness at the wound, or calf pain, call your surgeon's office.   Constipation Prevention   Complete by: As directed    Drink plenty of fluids.  Prune juice may be helpful.  You may use a stool softener, such as Colace (over the counter) 100 mg twice a day.  Use MiraLax (over the counter) for constipation as needed.   Diet - low sodium heart healthy   Complete by: As directed    Increase activity slowly as tolerated   Complete by: As directed      Allergies as of 10/04/2020      Reactions   Codeine Nausea And Vomiting   Oxycodone-acetaminophen Nausea And Vomiting      Medication List    TAKE these medications   ALPRAZolam 0.5 MG tablet Commonly known as: XANAX Take 0.5 mg by mouth at bedtime.   aspirin EC 81 MG tablet Take 81 mg by mouth daily. Swallow whole.   atorvastatin 20 MG tablet Commonly known as: LIPITOR Take 20 mg by mouth at bedtime.   buPROPion 300 MG 24 hr tablet Commonly known  as: WELLBUTRIN XL Take 300 mg by mouth daily.   busPIRone 30 MG tablet Commonly known as: BUSPAR Take 30 mg by mouth 2 (two) times daily.   Fluticasone-Umeclidin-Vilant 100-62.5-25 MCG/INH Aepb Inhale 1 puff into the lungs daily.   ipratropium-albuterol 0.5-2.5 (3) MG/3ML Soln Commonly known as: DUONEB Inhale 3 mLs into the lungs every 4 (four) hours as needed (respiratory issues.).   levothyroxine 137 MCG tablet Commonly known as: SYNTHROID Take 137 mcg by mouth daily before breakfast.   metoprolol succinate 50 MG 24 hr tablet Commonly known as: TOPROL-XL Take 50 mg by mouth daily. Take with or immediately following a meal.   MUCINEX DM MAXIMUM STRENGTH PO Take 15 mLs by mouth every 4 (four) hours as needed (cough/cold symptoms).   predniSONE 20 MG tablet Commonly known as: DELTASONE Take 20 mg by mouth daily with breakfast.   sulfamethoxazole-trimethoprim 800-160 MG tablet Commonly known as: BACTRIM DS Take 1 tablet by mouth 2 (two) times daily for 14 days.   valsartan-hydrochlorothiazide 160-12.5 MG tablet Commonly known as: DIOVAN-HCT Take 1 tablet by mouth daily.   Ventolin HFA 108 (90 Base) MCG/ACT inhaler Generic drug: albuterol INHALE  2 PUFFS INTO THE LUNGS EVERY 4 HOURS AS NEEDED FOR WHEEZING ORSHORTNESS OF BREATH What changed: See the new instructions.   Vitamin D3 25 MCG tablet Commonly known as: Vitamin D Take 2 tablets (2,000 Units total) by mouth daily. Start taking on: October 05, 2020       Follow-up Information    Haddix, Thomasene Lot, MD Follow up on 10/13/2020.   Specialty: Orthopedic Surgery Why: for wound check and suture removal Contact information: [place] [address] [phone]               Discharge Instructions and Plan: Patient will be discharged to home.  Will remain weightbearing as tolerated on the right upper extremity.  No DVT prophylaxis needed at discharge.  Patient already has all necessary DME for  discharge.  Will be discharged on Bactrim twice daily for wound repacking.  Patient will follow up with Dr. Doreatha Martin on 10/13/2020 for repeat x-rays and suture removal.   Signed:  Leary Roca. Carmie Kanner ?[phone]? (phone) 10/04/2020, 1:48 PM  [facility] [place] [address] [phone] (F)

## 2020-10-04 NOTE — TOC Transition Note (Signed)
Transition of Care [facility]) - CM/SW Discharge Note   Patient Details  Name: HETVI SHAWHAN MRN: [id number] Date of Birth: Sep 23, 1946  Transition of Care [facility]) CM/SW Contact:  Carles Collet, RN Phone Number: 10/04/2020, 1:08 PM   Clinical Narrative:   SPoke w patient's daughter, she is agreeable to any highly rated [place]. [name] accepted referral. Patient will DC to [place], [place], daughter [name] will be point of contact.They declined DME    Final next level of care: Home w Home Health Services Barriers to Discharge: No Barriers Identified   Patient Goals and CMS Choice Patient states their goals for this hospitalization and ongoing recovery are:: to return home CMS Medicare.gov Compare Post Acute Care list provided to:: Other (Comment Required) Choice offered to / list presented to : Adult Children  Discharge Placement                       Discharge Plan and Services                DME Arranged: N/A         HH Arranged: PT,OT [place] Agency: [place] Date [facility] Agency Contacted: 10/04/20 Time [place]: 1307 Representative spoke with at [place]: [place] ([place]) Interventions     Readmission Risk Interventions No flowsheet data found.

## 2020-10-05 ENCOUNTER — Encounter (HOSPITAL_COMMUNITY): Payer: Self-pay | Admitting: Student

## 2020-10-07 LAB — AEROBIC/ANAEROBIC CULTURE W GRAM STAIN (SURGICAL/DEEP WOUND)

## 2020-10-08 DIAGNOSIS — R32 Unspecified urinary incontinence: Secondary | ICD-10-CM | POA: Diagnosis not present

## 2020-10-08 DIAGNOSIS — G4730 Sleep apnea, unspecified: Secondary | ICD-10-CM | POA: Diagnosis not present

## 2020-10-08 DIAGNOSIS — B9562 Methicillin resistant Staphylococcus aureus infection as the cause of diseases classified elsewhere: Secondary | ICD-10-CM | POA: Diagnosis not present

## 2020-10-08 DIAGNOSIS — Z9181 History of falling: Secondary | ICD-10-CM | POA: Diagnosis not present

## 2020-10-08 DIAGNOSIS — F419 Anxiety disorder, unspecified: Secondary | ICD-10-CM | POA: Diagnosis not present

## 2020-10-08 DIAGNOSIS — Z85828 Personal history of other malignant neoplasm of skin: Secondary | ICD-10-CM | POA: Diagnosis not present

## 2020-10-08 DIAGNOSIS — Z8701 Personal history of pneumonia (recurrent): Secondary | ICD-10-CM | POA: Diagnosis not present

## 2020-10-08 DIAGNOSIS — J439 Emphysema, unspecified: Secondary | ICD-10-CM | POA: Diagnosis not present

## 2020-10-08 DIAGNOSIS — Z7952 Long term (current) use of systemic steroids: Secondary | ICD-10-CM | POA: Diagnosis not present

## 2020-10-08 DIAGNOSIS — Z7982 Long term (current) use of aspirin: Secondary | ICD-10-CM | POA: Diagnosis not present

## 2020-10-08 DIAGNOSIS — E039 Hypothyroidism, unspecified: Secondary | ICD-10-CM | POA: Diagnosis not present

## 2020-10-08 DIAGNOSIS — Z87891 Personal history of nicotine dependence: Secondary | ICD-10-CM | POA: Diagnosis not present

## 2020-10-08 DIAGNOSIS — Z9981 Dependence on supplemental oxygen: Secondary | ICD-10-CM | POA: Diagnosis not present

## 2020-10-08 DIAGNOSIS — I10 Essential (primary) hypertension: Secondary | ICD-10-CM | POA: Diagnosis not present

## 2020-10-08 DIAGNOSIS — E782 Mixed hyperlipidemia: Secondary | ICD-10-CM | POA: Diagnosis not present

## 2020-10-08 DIAGNOSIS — Z792 Long term (current) use of antibiotics: Secondary | ICD-10-CM | POA: Diagnosis not present

## 2020-10-08 DIAGNOSIS — M542 Cervicalgia: Secondary | ICD-10-CM | POA: Diagnosis not present

## 2020-10-08 DIAGNOSIS — R350 Frequency of micturition: Secondary | ICD-10-CM | POA: Diagnosis not present

## 2020-10-08 DIAGNOSIS — Z4789 Encounter for other orthopedic aftercare: Secondary | ICD-10-CM | POA: Diagnosis not present

## 2020-10-08 DIAGNOSIS — Z4801 Encounter for change or removal of surgical wound dressing: Secondary | ICD-10-CM | POA: Diagnosis not present

## 2020-10-08 DIAGNOSIS — M7021 Olecranon bursitis, right elbow: Secondary | ICD-10-CM | POA: Diagnosis not present

## 2020-10-12 DIAGNOSIS — Z4801 Encounter for change or removal of surgical wound dressing: Secondary | ICD-10-CM | POA: Diagnosis not present

## 2020-10-12 DIAGNOSIS — J439 Emphysema, unspecified: Secondary | ICD-10-CM | POA: Diagnosis not present

## 2020-10-12 DIAGNOSIS — B9562 Methicillin resistant Staphylococcus aureus infection as the cause of diseases classified elsewhere: Secondary | ICD-10-CM | POA: Diagnosis not present

## 2020-10-12 DIAGNOSIS — I10 Essential (primary) hypertension: Secondary | ICD-10-CM | POA: Diagnosis not present

## 2020-10-12 DIAGNOSIS — Z4789 Encounter for other orthopedic aftercare: Secondary | ICD-10-CM | POA: Diagnosis not present

## 2020-10-12 DIAGNOSIS — M7021 Olecranon bursitis, right elbow: Secondary | ICD-10-CM | POA: Diagnosis not present

## 2020-10-13 ENCOUNTER — Ambulatory Visit
Admission: RE | Admit: 2020-10-13 | Discharge: 2020-10-13 | Disposition: A | Discharge status: Home / Self Care | Payer: Medicare Other | Source: Ambulatory Visit | Attending: Family Medicine | Admitting: Family Medicine

## 2020-10-13 ENCOUNTER — Other Ambulatory Visit: Payer: Self-pay

## 2020-10-13 DIAGNOSIS — I6782 Cerebral ischemia: Secondary | ICD-10-CM | POA: Diagnosis not present

## 2020-10-13 DIAGNOSIS — R410 Disorientation, unspecified: Secondary | ICD-10-CM

## 2020-10-13 DIAGNOSIS — G319 Degenerative disease of nervous system, unspecified: Secondary | ICD-10-CM | POA: Diagnosis not present

## 2020-10-13 IMAGING — MR MR HEAD W/O CM
10 series · 48 of 48 positions shown · non-contrast
Comparison: [DATE] and prior.

CLINICAL DATA: Confusion

EXAM:
MRI HEAD WITHOUT CONTRAST
TECHNIQUE: Multiplanar, multiecho pulse sequences of the brain and surrounding
structures were obtained without intravenous contrast.

[Series 2: T1 · sagittal · 5.0mm · 0.45mm/px · 3 of 22 slices shown]
[im 1/22]
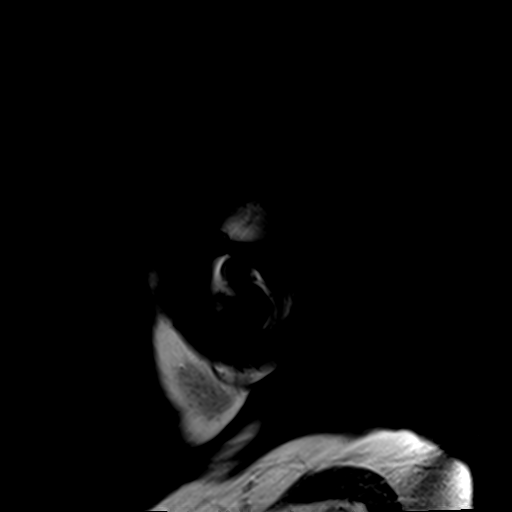
[im 11/22]
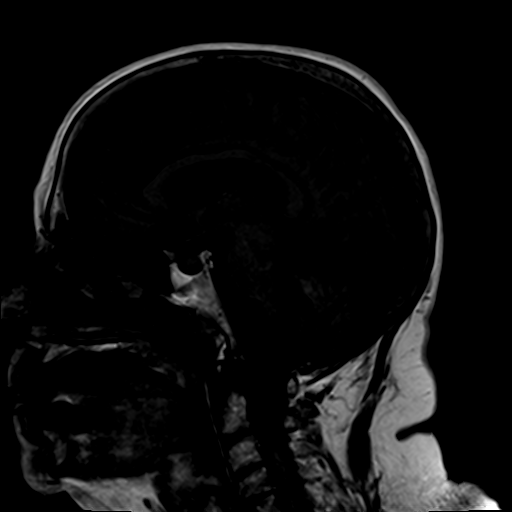
[im 22/22]
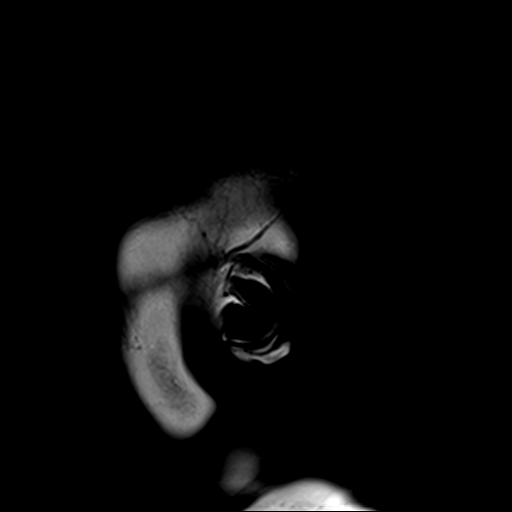

[Series 3: DWI · axial · 3.0mm · 1.80mm/px · z∈[-87,+54]mm · 9 of 100 slices shown (1 of 4)]
[im 1/100]
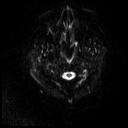
[im 13/100]
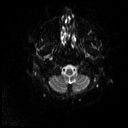
[im 25/100]
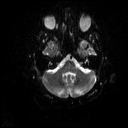
[im 38/100]
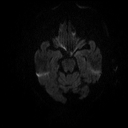
[im 50/100]
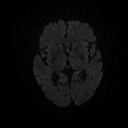
[im 62/100]
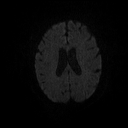
[im 75/100]
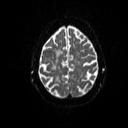
[im 87/100]
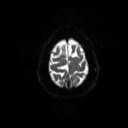
[im 100/100]
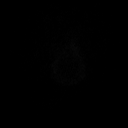

[Series 4: DWI · axial · 3.0mm · 1.80mm/px · z∈[-87,+54]mm · 4 of 45 slices shown (2 of 4)]
[im 1/45]
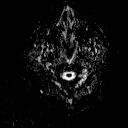
[im 15/45]
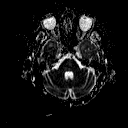
[im 30/45]
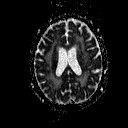
[im 45/45]
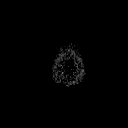

[Series 5: DWI · coronal · 5.0mm · 1.80mm/px · 6 of 70 slices shown (3 of 4)]
[im 1/70]
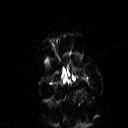
[im 14/70]
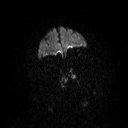
[im 28/70]
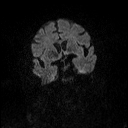
[im 42/70]
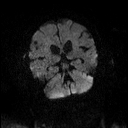
[im 56/70]
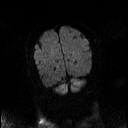
[im 70/70]
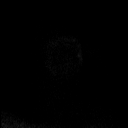

[Series 6: DWI · coronal · 5.0mm · 1.80mm/px · 3 of 35 slices shown (4 of 4)]
[im 1/35]
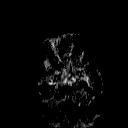
[im 18/35]
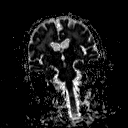
[im 35/35]
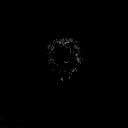

[Series 7: T2 · axial · 5.0mm · 0.72mm/px · z∈[-92,+52]mm · 2 of 22 slices shown (1 of 2)]
[im 1/22]
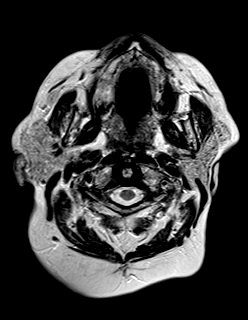
[im 22/22]
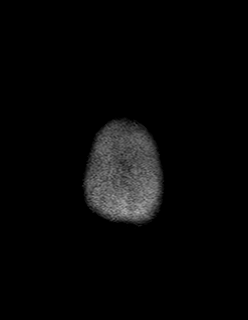

[Series 8: FLAIR · axial · 3.0mm · 0.45mm/px · z∈[-98,+48]mm · 3 of 33 slices shown]
[im 1/33]
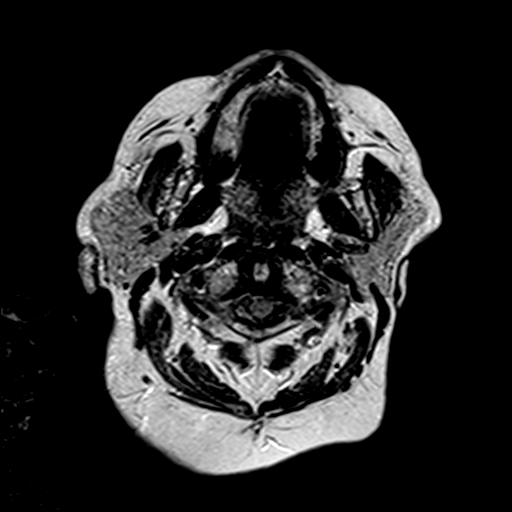
[im 17/33]
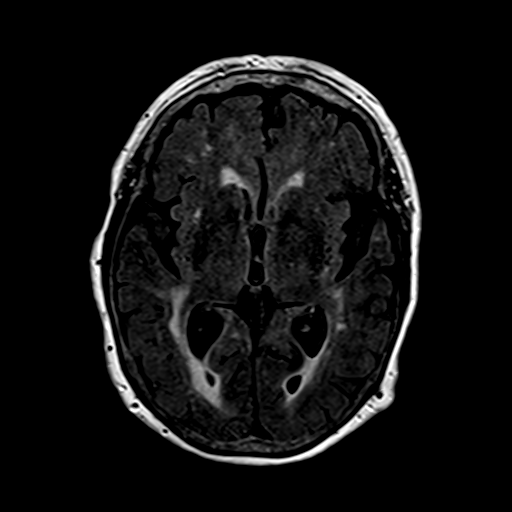
[im 33/33]
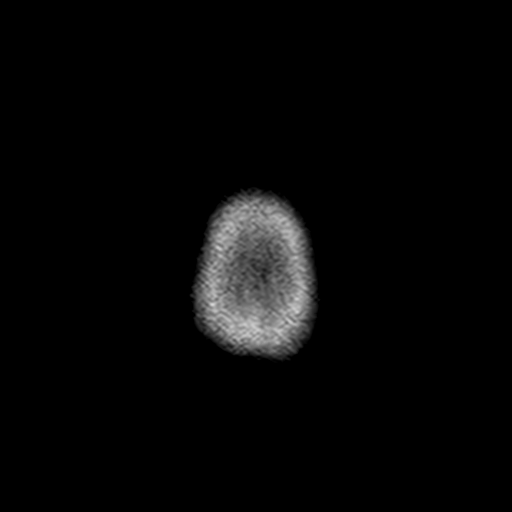

[Series 10: swi_images · axial · 4.0mm · 0.90mm/px · z∈[-92,+45]mm · 3 of 36 slices shown]
[im 1/36]
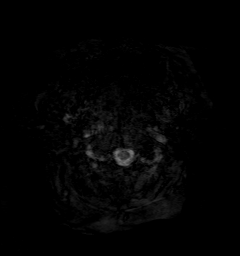
[im 18/36]
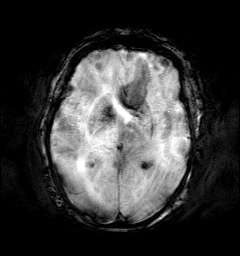
[im 36/36]
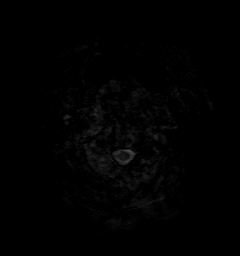

[Series 11: t1_mpr_tra · axial · 1.0mm · 0.89mm/px · z∈[-89,+50]mm · 13 of 144 slices shown]
[im 1/144]
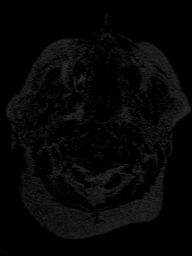
[im 12/144]
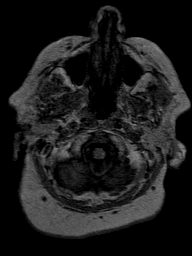
[im 24/144]
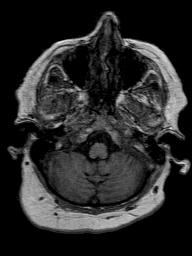
[im 36/144]
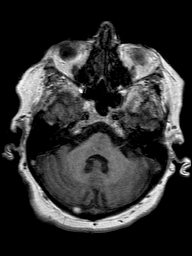
[im 48/144]
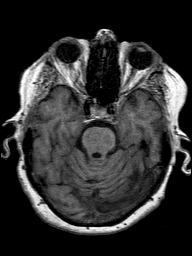
[im 60/144]
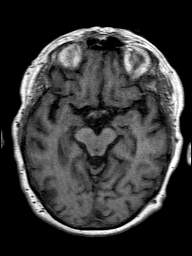
[im 72/144]
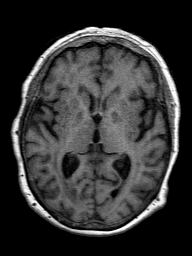
[im 84/144]
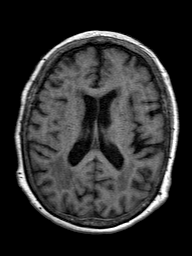
[im 96/144]
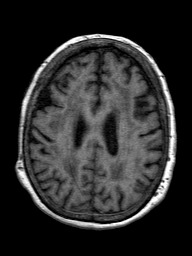
[im 108/144]
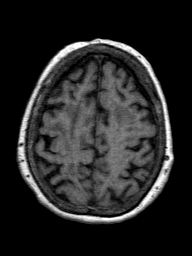
[im 120/144]
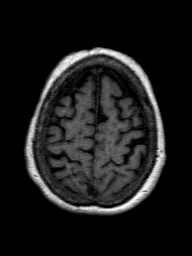
[im 132/144]
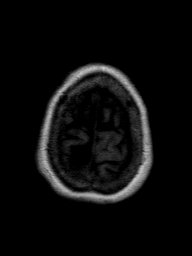
[im 144/144]
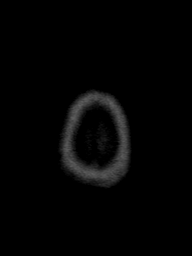

[Series 12: T2 · coronal · 5.0mm · 0.45mm/px · 2 of 27 slices shown (2 of 2)]
[im 1/27]
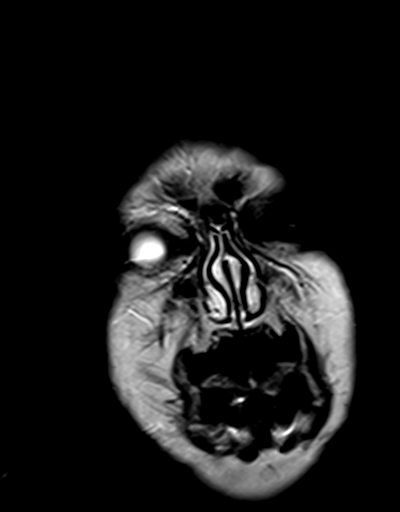
[im 27/27]
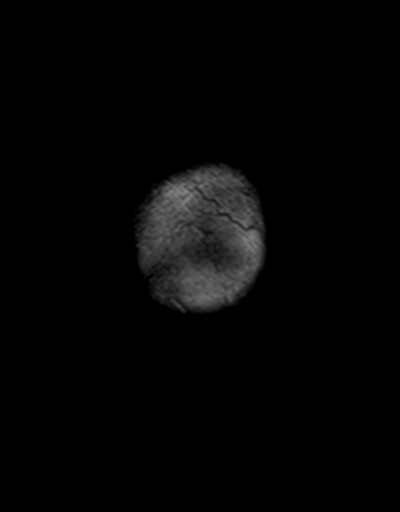

[48 of 48 positions shown; findings below may reference images not displayed]

FINDINGS: Please note that image quality is degraded by motion artifact.

Brain: No diffusion-weighted signal abnormality. No intracranial
hemorrhage. No midline shift, ventriculomegaly or extra-axial fluid
collection. No mass lesion. Mild cerebral atrophy with ex vacuo
dilatation. Advanced chronic microvascular ischemic changes.

Vascular: Normal flow voids.

Skull and upper cervical spine: Normal marrow signal.

Sinuses/Orbits: Sequela of right lens replacement. Normal orbits.
Pneumatized paranasal sinuses. Minimal left mastoid free fluid.

Other: None.
IMPRESSION: No acute intracranial process. Advanced chronic microvascular
ischemic changes.

Mild cerebral atrophy.

## 2020-10-14 DIAGNOSIS — M7021 Olecranon bursitis, right elbow: Secondary | ICD-10-CM | POA: Diagnosis not present

## 2020-10-14 DIAGNOSIS — Z4789 Encounter for other orthopedic aftercare: Secondary | ICD-10-CM | POA: Diagnosis not present

## 2020-10-14 DIAGNOSIS — J439 Emphysema, unspecified: Secondary | ICD-10-CM | POA: Diagnosis not present

## 2020-10-14 DIAGNOSIS — B9562 Methicillin resistant Staphylococcus aureus infection as the cause of diseases classified elsewhere: Secondary | ICD-10-CM | POA: Diagnosis not present

## 2020-10-14 DIAGNOSIS — I10 Essential (primary) hypertension: Secondary | ICD-10-CM | POA: Diagnosis not present

## 2020-10-14 DIAGNOSIS — Z4801 Encounter for change or removal of surgical wound dressing: Secondary | ICD-10-CM | POA: Diagnosis not present

## 2020-10-20 DIAGNOSIS — Z4789 Encounter for other orthopedic aftercare: Secondary | ICD-10-CM | POA: Diagnosis not present

## 2020-10-20 DIAGNOSIS — B9562 Methicillin resistant Staphylococcus aureus infection as the cause of diseases classified elsewhere: Secondary | ICD-10-CM | POA: Diagnosis not present

## 2020-10-20 DIAGNOSIS — J439 Emphysema, unspecified: Secondary | ICD-10-CM | POA: Diagnosis not present

## 2020-10-20 DIAGNOSIS — Z4801 Encounter for change or removal of surgical wound dressing: Secondary | ICD-10-CM | POA: Diagnosis not present

## 2020-10-20 DIAGNOSIS — I10 Essential (primary) hypertension: Secondary | ICD-10-CM | POA: Diagnosis not present

## 2020-10-20 DIAGNOSIS — M7021 Olecranon bursitis, right elbow: Secondary | ICD-10-CM | POA: Diagnosis not present

## 2020-10-20 DIAGNOSIS — S52021D Displaced fracture of olecranon process without intraarticular extension of right ulna, subsequent encounter for closed fracture with routine healing: Secondary | ICD-10-CM | POA: Diagnosis not present

## 2020-10-21 DIAGNOSIS — R918 Other nonspecific abnormal finding of lung field: Secondary | ICD-10-CM | POA: Diagnosis not present

## 2020-10-21 DIAGNOSIS — J449 Chronic obstructive pulmonary disease, unspecified: Secondary | ICD-10-CM | POA: Diagnosis not present

## 2020-10-21 DIAGNOSIS — R601 Generalized edema: Secondary | ICD-10-CM | POA: Diagnosis not present

## 2020-10-21 DIAGNOSIS — F411 Generalized anxiety disorder: Secondary | ICD-10-CM | POA: Diagnosis not present

## 2020-10-21 DIAGNOSIS — G4733 Obstructive sleep apnea (adult) (pediatric): Secondary | ICD-10-CM | POA: Diagnosis not present

## 2020-10-21 DIAGNOSIS — J9611 Chronic respiratory failure with hypoxia: Secondary | ICD-10-CM | POA: Diagnosis not present

## 2020-10-21 DIAGNOSIS — R5383 Other fatigue: Secondary | ICD-10-CM | POA: Diagnosis not present

## 2020-10-23 DIAGNOSIS — E785 Hyperlipidemia, unspecified: Secondary | ICD-10-CM | POA: Diagnosis not present

## 2020-10-23 DIAGNOSIS — I10 Essential (primary) hypertension: Secondary | ICD-10-CM | POA: Diagnosis not present

## 2020-10-23 DIAGNOSIS — M1612 Unilateral primary osteoarthritis, left hip: Secondary | ICD-10-CM | POA: Diagnosis not present

## 2020-10-23 DIAGNOSIS — J439 Emphysema, unspecified: Secondary | ICD-10-CM | POA: Diagnosis not present

## 2020-10-23 DIAGNOSIS — I129 Hypertensive chronic kidney disease with stage 1 through stage 4 chronic kidney disease, or unspecified chronic kidney disease: Secondary | ICD-10-CM | POA: Diagnosis not present

## 2020-10-23 DIAGNOSIS — Z4801 Encounter for change or removal of surgical wound dressing: Secondary | ICD-10-CM | POA: Diagnosis not present

## 2020-10-23 DIAGNOSIS — E890 Postprocedural hypothyroidism: Secondary | ICD-10-CM | POA: Diagnosis not present

## 2020-10-23 DIAGNOSIS — Z4789 Encounter for other orthopedic aftercare: Secondary | ICD-10-CM | POA: Diagnosis not present

## 2020-10-23 DIAGNOSIS — M1611 Unilateral primary osteoarthritis, right hip: Secondary | ICD-10-CM | POA: Diagnosis not present

## 2020-10-23 DIAGNOSIS — M7021 Olecranon bursitis, right elbow: Secondary | ICD-10-CM | POA: Diagnosis not present

## 2020-10-23 DIAGNOSIS — G4700 Insomnia, unspecified: Secondary | ICD-10-CM | POA: Diagnosis not present

## 2020-10-23 DIAGNOSIS — J449 Chronic obstructive pulmonary disease, unspecified: Secondary | ICD-10-CM | POA: Diagnosis not present

## 2020-10-23 DIAGNOSIS — N183 Chronic kidney disease, stage 3 unspecified: Secondary | ICD-10-CM | POA: Diagnosis not present

## 2020-10-23 DIAGNOSIS — C3490 Malignant neoplasm of unspecified part of unspecified bronchus or lung: Secondary | ICD-10-CM | POA: Diagnosis not present

## 2020-10-23 DIAGNOSIS — B9562 Methicillin resistant Staphylococcus aureus infection as the cause of diseases classified elsewhere: Secondary | ICD-10-CM | POA: Diagnosis not present

## 2020-10-23 DIAGNOSIS — F325 Major depressive disorder, single episode, in full remission: Secondary | ICD-10-CM | POA: Diagnosis not present

## 2020-10-27 DIAGNOSIS — I10 Essential (primary) hypertension: Secondary | ICD-10-CM | POA: Diagnosis not present

## 2020-10-27 DIAGNOSIS — B9562 Methicillin resistant Staphylococcus aureus infection as the cause of diseases classified elsewhere: Secondary | ICD-10-CM | POA: Diagnosis not present

## 2020-10-27 DIAGNOSIS — M7021 Olecranon bursitis, right elbow: Secondary | ICD-10-CM | POA: Diagnosis not present

## 2020-10-27 DIAGNOSIS — J439 Emphysema, unspecified: Secondary | ICD-10-CM | POA: Diagnosis not present

## 2020-10-27 DIAGNOSIS — Z4789 Encounter for other orthopedic aftercare: Secondary | ICD-10-CM | POA: Diagnosis not present

## 2020-10-27 DIAGNOSIS — Z4801 Encounter for change or removal of surgical wound dressing: Secondary | ICD-10-CM | POA: Diagnosis not present

## 2020-10-28 DIAGNOSIS — Z4789 Encounter for other orthopedic aftercare: Secondary | ICD-10-CM | POA: Diagnosis not present

## 2020-10-28 DIAGNOSIS — Z4801 Encounter for change or removal of surgical wound dressing: Secondary | ICD-10-CM | POA: Diagnosis not present

## 2020-10-28 DIAGNOSIS — R0600 Dyspnea, unspecified: Secondary | ICD-10-CM | POA: Diagnosis not present

## 2020-10-28 DIAGNOSIS — R5383 Other fatigue: Secondary | ICD-10-CM | POA: Diagnosis not present

## 2020-10-28 DIAGNOSIS — J439 Emphysema, unspecified: Secondary | ICD-10-CM | POA: Diagnosis not present

## 2020-10-28 DIAGNOSIS — J84112 Idiopathic pulmonary fibrosis: Secondary | ICD-10-CM | POA: Diagnosis not present

## 2020-10-28 DIAGNOSIS — M7021 Olecranon bursitis, right elbow: Secondary | ICD-10-CM | POA: Diagnosis not present

## 2020-10-28 DIAGNOSIS — I10 Essential (primary) hypertension: Secondary | ICD-10-CM | POA: Diagnosis not present

## 2020-10-28 DIAGNOSIS — J449 Chronic obstructive pulmonary disease, unspecified: Secondary | ICD-10-CM | POA: Diagnosis not present

## 2020-10-28 DIAGNOSIS — B9562 Methicillin resistant Staphylococcus aureus infection as the cause of diseases classified elsewhere: Secondary | ICD-10-CM | POA: Diagnosis not present

## 2020-10-29 DIAGNOSIS — Z4789 Encounter for other orthopedic aftercare: Secondary | ICD-10-CM | POA: Diagnosis not present

## 2020-10-29 DIAGNOSIS — B9562 Methicillin resistant Staphylococcus aureus infection as the cause of diseases classified elsewhere: Secondary | ICD-10-CM | POA: Diagnosis not present

## 2020-10-29 DIAGNOSIS — Z4801 Encounter for change or removal of surgical wound dressing: Secondary | ICD-10-CM | POA: Diagnosis not present

## 2020-10-29 DIAGNOSIS — I10 Essential (primary) hypertension: Secondary | ICD-10-CM | POA: Diagnosis not present

## 2020-10-29 DIAGNOSIS — M7021 Olecranon bursitis, right elbow: Secondary | ICD-10-CM | POA: Diagnosis not present

## 2020-10-29 DIAGNOSIS — J439 Emphysema, unspecified: Secondary | ICD-10-CM | POA: Diagnosis not present

## 2020-11-07 DIAGNOSIS — Z7982 Long term (current) use of aspirin: Secondary | ICD-10-CM | POA: Diagnosis not present

## 2020-11-07 DIAGNOSIS — M542 Cervicalgia: Secondary | ICD-10-CM | POA: Diagnosis not present

## 2020-11-07 DIAGNOSIS — Z8701 Personal history of pneumonia (recurrent): Secondary | ICD-10-CM | POA: Diagnosis not present

## 2020-11-07 DIAGNOSIS — B9562 Methicillin resistant Staphylococcus aureus infection as the cause of diseases classified elsewhere: Secondary | ICD-10-CM | POA: Diagnosis not present

## 2020-11-07 DIAGNOSIS — Z792 Long term (current) use of antibiotics: Secondary | ICD-10-CM | POA: Diagnosis not present

## 2020-11-07 DIAGNOSIS — J439 Emphysema, unspecified: Secondary | ICD-10-CM | POA: Diagnosis not present

## 2020-11-07 DIAGNOSIS — Z4801 Encounter for change or removal of surgical wound dressing: Secondary | ICD-10-CM | POA: Diagnosis not present

## 2020-11-07 DIAGNOSIS — M7021 Olecranon bursitis, right elbow: Secondary | ICD-10-CM | POA: Diagnosis not present

## 2020-11-07 DIAGNOSIS — E039 Hypothyroidism, unspecified: Secondary | ICD-10-CM | POA: Diagnosis not present

## 2020-11-07 DIAGNOSIS — Z7952 Long term (current) use of systemic steroids: Secondary | ICD-10-CM | POA: Diagnosis not present

## 2020-11-07 DIAGNOSIS — Z85828 Personal history of other malignant neoplasm of skin: Secondary | ICD-10-CM | POA: Diagnosis not present

## 2020-11-07 DIAGNOSIS — R350 Frequency of micturition: Secondary | ICD-10-CM | POA: Diagnosis not present

## 2020-11-07 DIAGNOSIS — Z9181 History of falling: Secondary | ICD-10-CM | POA: Diagnosis not present

## 2020-11-07 DIAGNOSIS — R32 Unspecified urinary incontinence: Secondary | ICD-10-CM | POA: Diagnosis not present

## 2020-11-07 DIAGNOSIS — F419 Anxiety disorder, unspecified: Secondary | ICD-10-CM | POA: Diagnosis not present

## 2020-11-07 DIAGNOSIS — Z9981 Dependence on supplemental oxygen: Secondary | ICD-10-CM | POA: Diagnosis not present

## 2020-11-07 DIAGNOSIS — I10 Essential (primary) hypertension: Secondary | ICD-10-CM | POA: Diagnosis not present

## 2020-11-07 DIAGNOSIS — Z4789 Encounter for other orthopedic aftercare: Secondary | ICD-10-CM | POA: Diagnosis not present

## 2020-11-07 DIAGNOSIS — G4730 Sleep apnea, unspecified: Secondary | ICD-10-CM | POA: Diagnosis not present

## 2020-11-07 DIAGNOSIS — E782 Mixed hyperlipidemia: Secondary | ICD-10-CM | POA: Diagnosis not present

## 2020-11-07 DIAGNOSIS — Z87891 Personal history of nicotine dependence: Secondary | ICD-10-CM | POA: Diagnosis not present

## 2020-11-10 DIAGNOSIS — S52021D Displaced fracture of olecranon process without intraarticular extension of right ulna, subsequent encounter for closed fracture with routine healing: Secondary | ICD-10-CM | POA: Diagnosis not present

## 2020-11-10 DIAGNOSIS — M7021 Olecranon bursitis, right elbow: Secondary | ICD-10-CM | POA: Diagnosis not present

## 2020-11-17 ENCOUNTER — Ambulatory Visit (HOSPITAL_COMMUNITY)
Admission: RE | Admit: 2020-11-17 | Discharge: 2020-11-17 | Disposition: A | Discharge status: Home / Self Care | Payer: Medicare Other | Source: Ambulatory Visit | Attending: Radiation Oncology | Admitting: Radiation Oncology

## 2020-11-17 ENCOUNTER — Other Ambulatory Visit: Payer: Self-pay

## 2020-11-17 DIAGNOSIS — N183 Chronic kidney disease, stage 3 unspecified: Secondary | ICD-10-CM | POA: Diagnosis not present

## 2020-11-17 DIAGNOSIS — I2510 Atherosclerotic heart disease of native coronary artery without angina pectoris: Secondary | ICD-10-CM | POA: Diagnosis not present

## 2020-11-17 DIAGNOSIS — E890 Postprocedural hypothyroidism: Secondary | ICD-10-CM | POA: Diagnosis not present

## 2020-11-17 DIAGNOSIS — C3412 Malignant neoplasm of upper lobe, left bronchus or lung: Secondary | ICD-10-CM | POA: Insufficient documentation

## 2020-11-17 DIAGNOSIS — J449 Chronic obstructive pulmonary disease, unspecified: Secondary | ICD-10-CM | POA: Diagnosis not present

## 2020-11-17 DIAGNOSIS — G3184 Mild cognitive impairment, so stated: Secondary | ICD-10-CM | POA: Diagnosis not present

## 2020-11-17 DIAGNOSIS — I129 Hypertensive chronic kidney disease with stage 1 through stage 4 chronic kidney disease, or unspecified chronic kidney disease: Secondary | ICD-10-CM | POA: Diagnosis not present

## 2020-11-17 DIAGNOSIS — J984 Other disorders of lung: Secondary | ICD-10-CM | POA: Diagnosis not present

## 2020-11-17 DIAGNOSIS — C3490 Malignant neoplasm of unspecified part of unspecified bronchus or lung: Secondary | ICD-10-CM | POA: Diagnosis not present

## 2020-11-17 DIAGNOSIS — Z5111 Encounter for antineoplastic chemotherapy: Secondary | ICD-10-CM | POA: Diagnosis not present

## 2020-11-17 LAB — POCT I-STAT CREATININE: Creatinine, Ser: 1 mg/dL (ref 0.44–1.00)

## 2020-11-17 IMAGING — CT CT CHEST W/ CM
2 of 4 series · 15 of 36 positions shown, 18 images · IV contrast (omnipaque)
Comparison: [DATE].

CLINICAL DATA: Lung cancer, radiation therapy complete.

EXAM:
CT CHEST WITH CONTRAST
TECHNIQUE: Multidetector CT imaging of the chest was performed during
intravenous contrast administration.
CONTRAST:  75mL OMNIPAQUE IOHEXOL 300 MG/ML  SOLN

[Series 2: axial st · axial · 0.81mm/px · z∈[+1060,+1308]mm · 12 of 148 slices shown, 15 images]
[im 12/148  mediastinal]
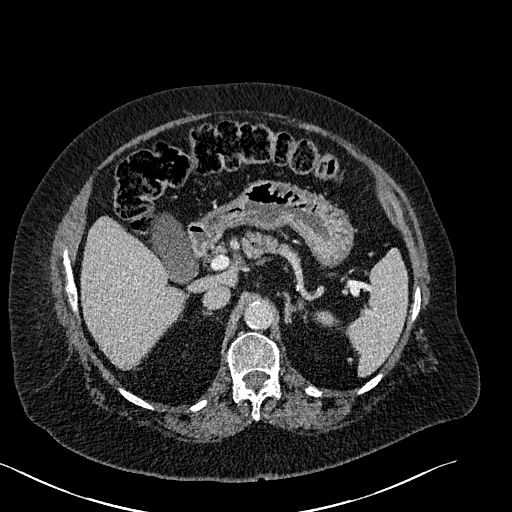
[im 12/148  lung]
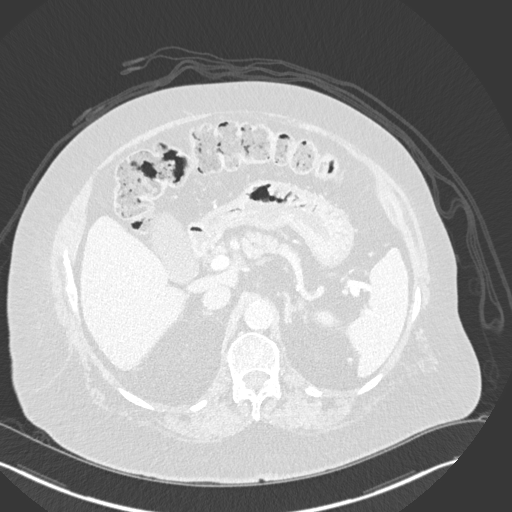
[im 23/148  lung]
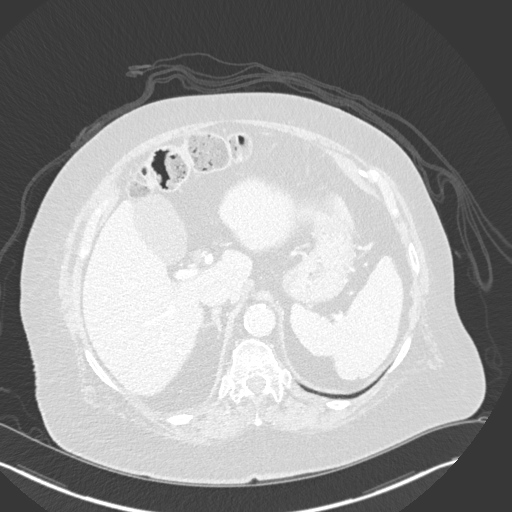
[im 34/148  lung]
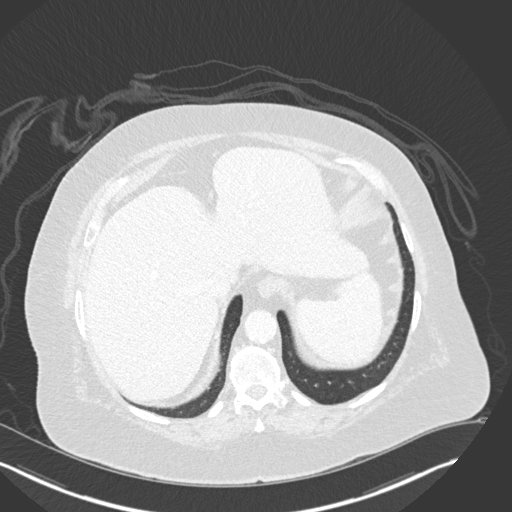
[im 46/148  lung]
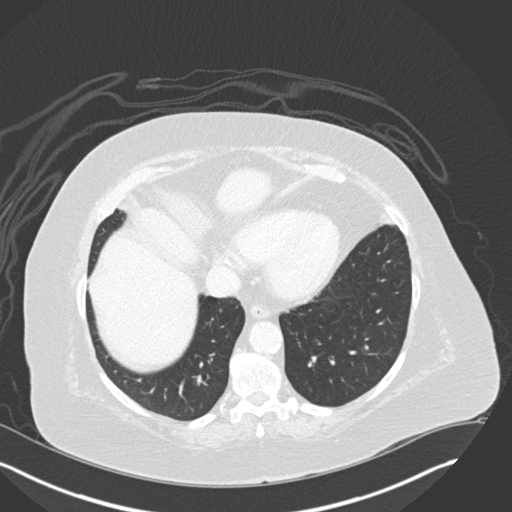
[im 57/148  mediastinal]
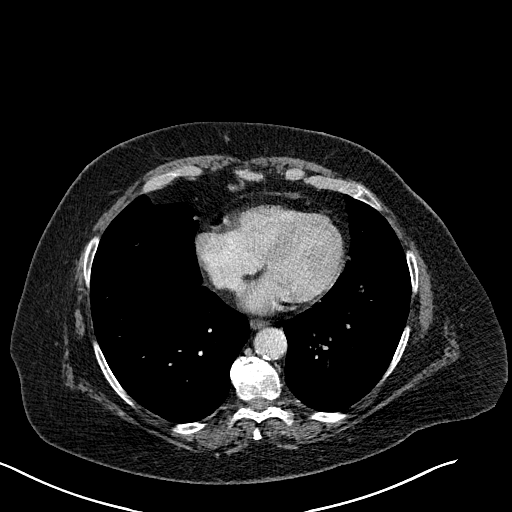
[im 57/148  lung]
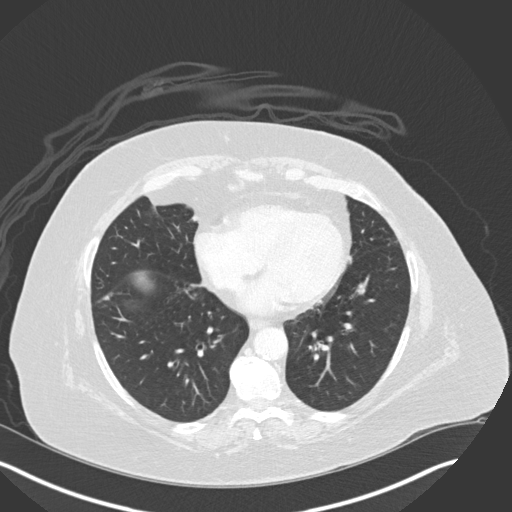
[im 68/148  lung]
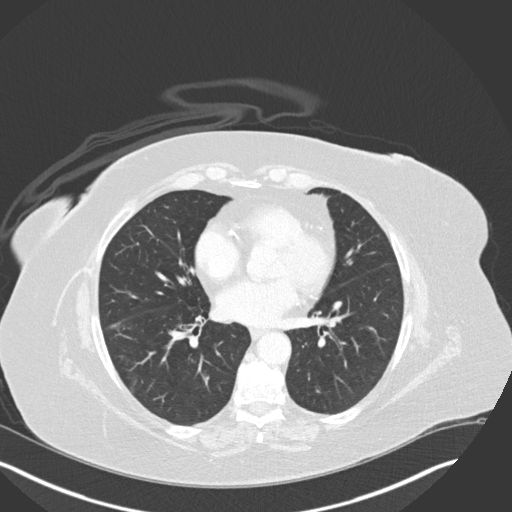
[im 80/148  lung]
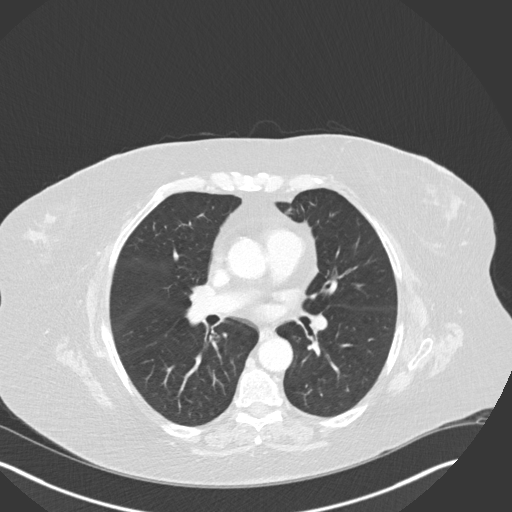
[im 91/148  lung]
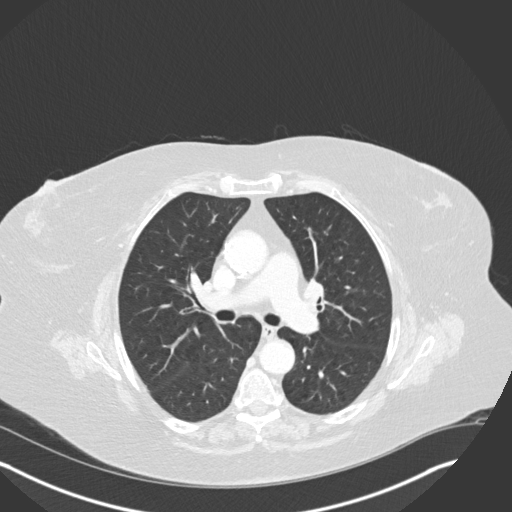
[im 102/148  mediastinal]
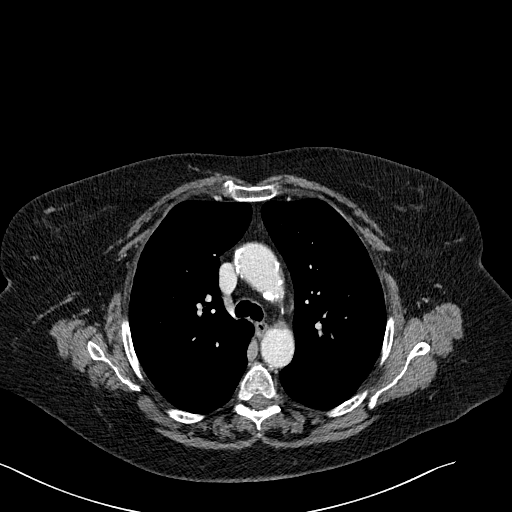
[im 102/148  lung]
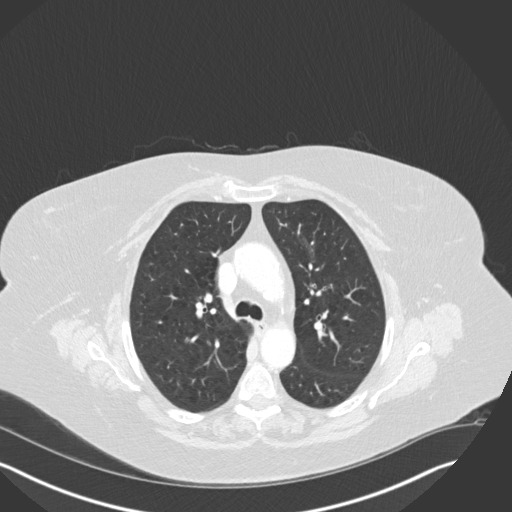
[im 114/148  lung]
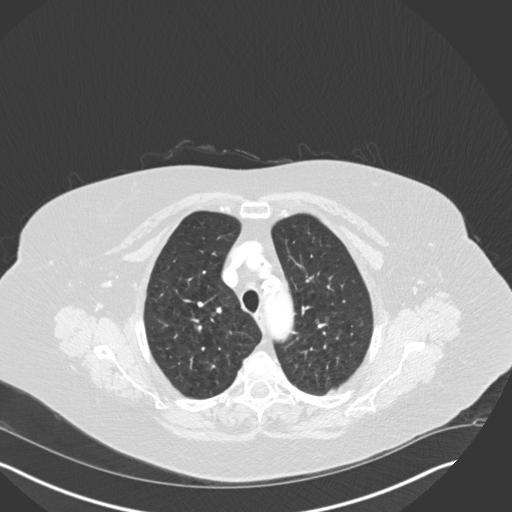
[im 125/148  lung]
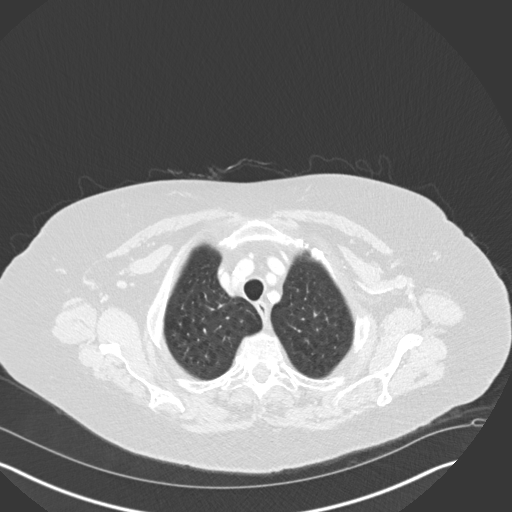
[im 136/148  lung]
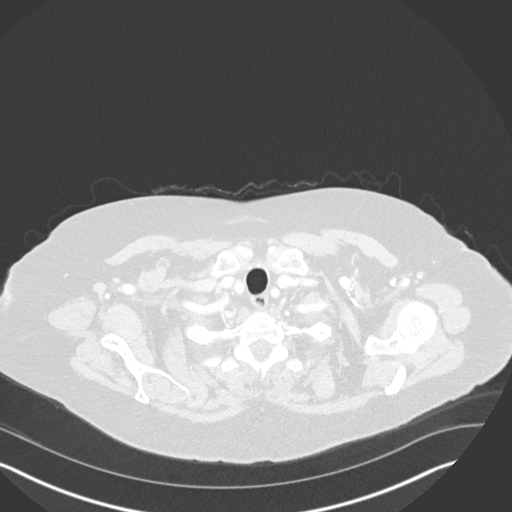

[Series 5: coronal · coronal · 0.58mm/px · 3 of 145 slices shown]
[im 29/145  lung]
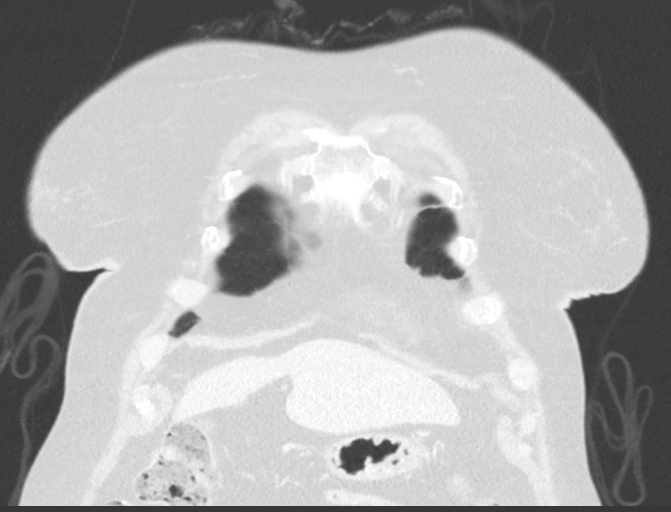
[im 58/145  lung]
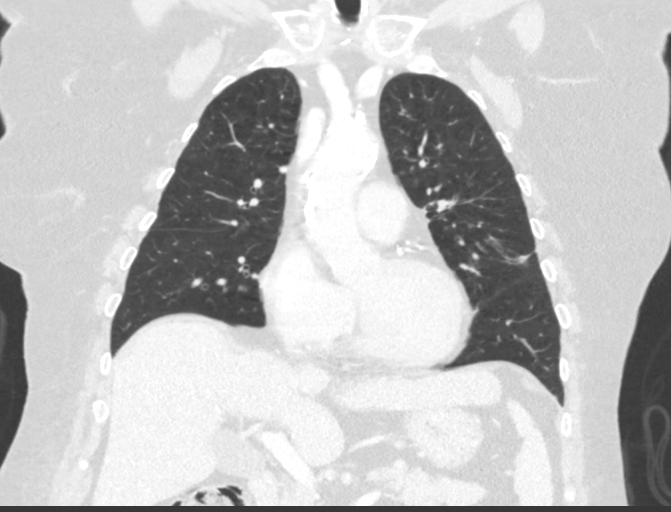
[im 87/145  lung]
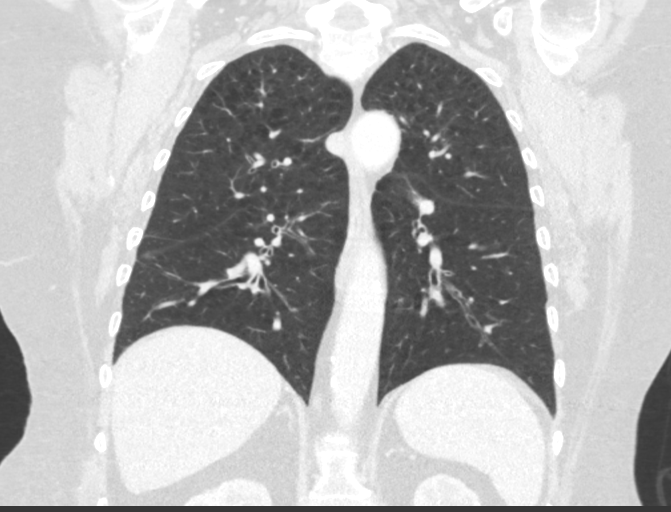

[15 of 36 positions shown; findings below may reference images not displayed]

FINDINGS: Cardiovascular: Atherosclerotic calcification of the aorta, aortic
valve and coronary arteries. Heart size normal. No pericardial
effusion.

Mediastinum/Nodes: Thyroidectomy. No pathologically enlarged
mediastinal, hilar or axillary lymph nodes. Esophagus is grossly
unremarkable.

Lungs/Pleura: Centrilobular emphysema. A 9 x 10 mm spiculated nodule
in the lateral segment right middle lobe (7/74) is stable from
[DATE] but has enlarged minimally from [DATE], at which time
it measured 6 x 9 mm. Post treatment scarring and architectural
distortion in the left upper lobe. No pleural fluid. Airway is
unremarkable.

Upper Abdomen: Visualized portions of the liver, gallbladder and
right adrenal gland are unremarkable. 11 mm nodule in the lateral
limb left adrenal gland is incompletely visualized but is stable
from [DATE], at which time it was characterized as an adenoma.
Visualized portions of the kidneys, spleen, pancreas, stomach and
bowel are grossly unremarkable. No upper abdominal adenopathy.

Musculoskeletal: Degenerative changes in the spine. No worrisome
lytic or sclerotic lesions.
IMPRESSION: 1. Spiculated right middle lobe nodule, stable from [DATE] but
minimally enlarged from [DATE]. Lesion remains suspicious for
adenocarcinoma.
2. Post treatment scarring and architectural distortion in the left
upper lobe.
3. Left adrenal adenoma.
4. Aortic atherosclerosis ([38]-[38]). Coronary artery
calcification.
5.  Emphysema ([38]-[38]).

## 2020-11-17 MED ORDER — IOHEXOL 300 MG/ML  SOLN
75.0000 mL | Freq: Once | INTRAMUSCULAR | Status: AC | PRN
  Administered 2020-11-17: 75 mL via INTRAVENOUS

## 2020-11-18 ENCOUNTER — Encounter: Payer: Self-pay | Admitting: Radiation Oncology

## 2020-11-18 ENCOUNTER — Telehealth: Payer: Self-pay

## 2020-11-18 ENCOUNTER — Other Ambulatory Visit: Payer: Self-pay

## 2020-11-18 NOTE — Telephone Encounter (Signed)
Spoke with patient in regards to telephone visit with Shona Simpson PA on 11/23/20 @1 :00pm. Patient verbalized understanding of appointment date and time. Reviewed meaningful use questions. TM

## 2020-11-19 ENCOUNTER — Ambulatory Visit
Admission: RE | Admit: 2020-11-19 | Discharge: 2020-11-19 | Disposition: A | Discharge status: Home / Self Care | Payer: Medicare Other | Source: Ambulatory Visit | Attending: Radiation Oncology | Admitting: Radiation Oncology

## 2020-11-19 ENCOUNTER — Other Ambulatory Visit: Payer: Self-pay

## 2020-11-19 DIAGNOSIS — B9562 Methicillin resistant Staphylococcus aureus infection as the cause of diseases classified elsewhere: Secondary | ICD-10-CM | POA: Diagnosis not present

## 2020-11-19 DIAGNOSIS — I10 Essential (primary) hypertension: Secondary | ICD-10-CM | POA: Diagnosis not present

## 2020-11-19 DIAGNOSIS — J439 Emphysema, unspecified: Secondary | ICD-10-CM | POA: Diagnosis not present

## 2020-11-19 DIAGNOSIS — Z4789 Encounter for other orthopedic aftercare: Secondary | ICD-10-CM | POA: Diagnosis not present

## 2020-11-19 DIAGNOSIS — M7021 Olecranon bursitis, right elbow: Secondary | ICD-10-CM | POA: Diagnosis not present

## 2020-11-19 DIAGNOSIS — Z4801 Encounter for change or removal of surgical wound dressing: Secondary | ICD-10-CM | POA: Diagnosis not present

## 2020-11-19 DIAGNOSIS — C3412 Malignant neoplasm of upper lobe, left bronchus or lung: Secondary | ICD-10-CM | POA: Diagnosis not present

## 2020-11-19 DIAGNOSIS — Z08 Encounter for follow-up examination after completed treatment for malignant neoplasm: Secondary | ICD-10-CM | POA: Diagnosis not present

## 2020-11-20 NOTE — Progress Notes (Signed)
Radiation Oncology         [phone] ________________________________  Outpatient Follow Up - Conducted via telephone due to current COVID-19 concerns for limiting patient exposure  I spoke with the patient to conduct this consult visit via telephone to spare the patient unnecessary potential exposure in the healthcare setting during the current COVID-19 pandemic. The patient was notified in advance and was offered a [place] meeting to allow for face to face communication but unfortunately reported that they did not have the appropriate resources/technology to support such a visit and instead preferred to proceed with a telephone visit.   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   Interval Since Last Radiation:  6 months  06/02/20-06/09/20 SBRT Treatment: The separate LUL target different from 2020 treatment was treated to 54 Gy in 3 fractions.  07/30/2019-08/06/2019 SBRT Treatment: The LUL target was treated to 54 Gy in 3 fractions.    Narrative:  Holly [name] is a pleasant 75 y.o. female originally seen at the request of Dr. Kipp Brood with a history of COPD, oxygen dependant at 2L [place] for several years. She was previously a patient of Dr. Lake Bells, but following pneumonia that required admission at [facility] about 2 years ago when she started needing O2, she continued with Dr. Alcide Clever.  CT imaging in July 2020 revealed a 12 x 8 mm left upper lobe nodule and a nodule in the RML measuring 13 x 7 mm. She underwent a PET scan on 06/05/2019 revealed hypermetabolic change with an SUV of 8.5 in the LUL and this measured 12 mm, and in the RML the nodule was 13 mm, and had an SUV of 1.4. She did not have any hypermetabolic adenopathy. A left adrenal adenoma was noted as well and was not  hypermetabolic. She underwent navigational bronchoscopy on 06/25/2019 with Dr. Kipp Brood and a biopsy and brushing of the LUL revealed squamous cell carcinoma. The RML lesion was also sampled, and atypical cells were noted but not diagnostic of cancer. She was discussed in [facility] conference and she did not appear to be a good candidate for surgical resection, and rather that she would benefit from SBRT to the LUL and probably follow the RML with serial imaging prior to intervening with treatment.  She did proceed with stereotactic body radiotherapy which she completed on 08/06/2019 to the left upper lobe.  Her RML nodule has wax and waned over time, but a change in a separate LUL nodule progressed and she was offered another course of SBRT as this was a separate nodule. She received this treatment in August 2021. She had a repeat scan on 11/17/20 that showed stable post treatment change, atherosclerotic and emphysematous chronic changes as well, and mild increase by about 1 mm in her RML nodule. Her films have been reviewed by Dr. Lisbeth Renshaw and he recommends repeat imaging at 3-4 month short interval. She's contacted by phone today to discuss theses results and recommendations.   On review of systems, the patient reports that she is doing well overall. She denies any chest pain, she does haveshortness of breath , cough, fevers, chills, night sweats, unintended weight changes. She denies any bowel or bladder disturbances, and denies abdominal pain, nausea or vomiting. She denies any new musculoskeletal or joint aches or pains, new skin lesions or  concerns. A complete review of systems is obtained and is otherwise negative.   Past Medical History:  Past Medical History:  Diagnosis Date  . Anxiety   . Asthma   . Basal cell carcinoma   . Bruises easily   . Cataract   . Cervicalgia   . 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 Dyspnea    uses O2 only when needed. Uses 2 L   .  Frequency of urination   . Headache   . 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 Hypercalcemia   . Hypertension   . Hypothyroidism   . Mixed hyperlipidemia   . Pneumonia   . Pulmonary nodule   . Sleep apnea    does not wear cpap  . Thyroid neoplasm   . Urinary incontinence   . Wears dentures   . Wears glasses     Past Surgical History: Past Surgical History:  Procedure Laterality Date  . COLONOSCOPY    . DENTAL SURGERY    . ELBOW ARTHROPLASTY Right 2020  . EYE SURGERY Right    cataract surgery  . FOOT SURGERY  30 YRS AGO   BILATERAL  . HARDWARE REMOVAL Right 10/02/2020   Procedure: HARDWARE REMOVAL RIGHT ELBOW;  Surgeon: Shona Needles, MD;  Location: [facility];  Service: Orthopedics;  Laterality: Right;  . ORIF ELBOW FRACTURE Right 12/06/2017   Procedure: OPEN REDUCTION INTERNAL FIXATION (ORIF) ELBOW/OLECRANON FRACTURE;  Surgeon: Shona Needles, MD;  Location: [facility];  Service: Orthopedics;  Laterality: Right;  . SKIN CANCER EXCISION    . THYROIDECTOMY N/A 07/31/2014   Procedure: TOTAL THYROIDECTOMY;  Surgeon: Armandina Gemma, MD;  Location: [facility];  Service: General;  Laterality: N/A;  . VAGINAL HYSTERECTOMY    . VIDEO BRONCHOSCOPY WITH ENDOBRONCHIAL NAVIGATION Bilateral 06/25/2019   Procedure: VIDEO BRONCHOSCOPY WITH ENDOBRONCHIAL NAVIGATION;  Surgeon: Lajuana Matte, MD;  Location: [facility];  Service: Thoracic;  Laterality: Bilateral;    Social History:  Social History   Socioeconomic History  . Marital status: Widowed    Spouse name: Not on file  . Number of children: 2  . Years of education: Not on file  . Highest education level: Not on file  Occupational History  . Occupation: [employer]  Tobacco Use  . Smoking status: Former Smoker    Packs/day: 1.00    Years: 30.00    Pack years: 30.00    Types: Cigarettes    Quit date: 02/24/2015    Years since quitting: 5.7  . Smokeless tobacco: Never Used  Vaping Use  . Vaping Use: Former  Substance and  Sexual Activity  . Alcohol use: Yes    Alcohol/week: 0.0 standard drinks    Comment: very seldom  . Drug use: Never  . Sexual activity: Not on file  Other Topics Concern  . Not on file  Social History Narrative  . Not on file   Social Determinants of Health   Financial Resource Strain: Not on file  Food Insecurity: Not on file  Transportation Needs: Not on file  Physical Activity: Not on file  Stress: Not on file  Social Connections: Not on file  Intimate Partner Violence: Not on file  The patient is widowed and lives in [place] in [place].  Family History: Family History  Problem Relation Age of Onset  . Coronary artery disease Father   . Hypertension Father   . Asthma Father   .  Allergies Father   . Hypertension Mother   . Lung cancer Mother      Medications: Current Outpatient Medications  Medication Sig Dispense Refill  . ALPRAZolam (XANAX) 0.5 MG tablet Take 0.5 mg by mouth at bedtime.    Marland Kitchen aspirin EC 81 MG tablet Take 81 mg by mouth daily. Swallow whole.    Marland Kitchen atorvastatin (LIPITOR) 20 MG tablet Take 20 mg by mouth at bedtime.     Marland Kitchen buPROPion (WELLBUTRIN XL) 300 MG 24 hr tablet Take 300 mg by mouth daily.    . busPIRone (BUSPAR) 30 MG tablet Take 30 mg by mouth 2 (two) times daily.    . cholecalciferol (VITAMIN D) 25 MCG tablet Take 2 tablets (2,000 Units total) by mouth daily. 60 tablet 1  . Dextromethorphan-guaiFENesin (MUCINEX DM MAXIMUM STRENGTH PO) Take 15 mLs by mouth every 4 (four) hours as needed (cough/cold symptoms).    . Fluticasone-Umeclidin-Vilant 100-62.5-25 MCG/INH AEPB Inhale 1 puff into the lungs daily.    Marland Kitchen ipratropium-albuterol (DUONEB) 0.5-2.5 (3) MG/3ML SOLN Inhale 3 mLs into the lungs every 4 (four) hours as needed (respiratory issues.).    Marland Kitchen levothyroxine (SYNTHROID) 137 MCG tablet Take 137 mcg by mouth daily before breakfast.    . metoprolol succinate (TOPROL-XL) 50 MG 24 hr tablet Take 50 mg by mouth daily. Take with or immediately  following a meal.    . predniSONE (DELTASONE) 20 MG tablet Take 20 mg by mouth daily with breakfast.    . valsartan-hydrochlorothiazide (DIOVAN-HCT) 160-12.5 MG tablet Take 1 tablet by mouth daily.    . VENTOLIN HFA 108 (90 Base) MCG/ACT inhaler INHALE 2 PUFFS INTO THE LUNGS EVERY 4 HOURS AS NEEDED FOR WHEEZING ORSHORTNESS OF BREATH (Patient taking differently: Inhale 2 puffs into the lungs every 4 (four) hours as needed for wheezing or shortness of breath.) 8 g 0   No current facility-administered medications for this encounter.     Allergies:  Allergies  Allergen Reactions  . Codeine Nausea And Vomiting  . Oxycodone-Acetaminophen Nausea And Vomiting   Physical Exam: Unable to assess due to encounter type.  Impression/Plan: 1.         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 The patient's recent imaging regarding her LUL is stable and shows post treatment changes without progressive concerns. These will be followed as we also follow her RML nodule.  3. RML nodule with atypical cells on biopsy but below FDG affinity on PET scan.  We will continue to follow this closely during another short interval scan at the recommendation of Dr. Lisbeth Renshaw who's also reviewed her imaging. He recommends this at 3-4 months, and if minimally increasing would have a low threshold to offer another course of SBRT. She is in agreement with this plan. 4. COPD. The patient will continue under the care of  Dr. Alcide Clever in [place].      Given current concerns for patient exposure during the COVID-19 pandemic, this encounter was conducted via telephone.  The patient has provided two factor identification and has given verbal consent for this type of encounter and has been advised to only accept a meeting of this type in a secure network environment. The time spent during this encounter was 45 minutes including preparation, discussion, and  coordination of the patient's care. The attendants for this meeting include Hayden Pedro  and Marcelyn Ditty . During the encounter, Hayden Pedro were located  at [facility] Radiation Oncology Department.  Lonzo Candy Goetze was located at home.    Carola Rhine, PAC

## 2020-11-23 ENCOUNTER — Inpatient Hospital Stay
Admission: RE | Admit: 2020-11-23 | Discharge: 2020-11-23 | Disposition: A | Discharge status: Home / Self Care | Payer: Self-pay | Source: Ambulatory Visit | Attending: Radiation Oncology | Admitting: Radiation Oncology

## 2020-11-23 DIAGNOSIS — I10 Essential (primary) hypertension: Secondary | ICD-10-CM | POA: Diagnosis not present

## 2020-11-23 DIAGNOSIS — M7021 Olecranon bursitis, right elbow: Secondary | ICD-10-CM | POA: Diagnosis not present

## 2020-11-23 DIAGNOSIS — J439 Emphysema, unspecified: Secondary | ICD-10-CM | POA: Diagnosis not present

## 2020-11-23 DIAGNOSIS — C3412 Malignant neoplasm of upper lobe, left bronchus or lung: Secondary | ICD-10-CM

## 2020-11-23 DIAGNOSIS — Z4801 Encounter for change or removal of surgical wound dressing: Secondary | ICD-10-CM | POA: Diagnosis not present

## 2020-11-23 DIAGNOSIS — Z4789 Encounter for other orthopedic aftercare: Secondary | ICD-10-CM | POA: Diagnosis not present

## 2020-11-23 DIAGNOSIS — B9562 Methicillin resistant Staphylococcus aureus infection as the cause of diseases classified elsewhere: Secondary | ICD-10-CM | POA: Diagnosis not present

## 2020-11-24 DIAGNOSIS — F411 Generalized anxiety disorder: Secondary | ICD-10-CM | POA: Diagnosis not present

## 2020-11-24 DIAGNOSIS — J449 Chronic obstructive pulmonary disease, unspecified: Secondary | ICD-10-CM | POA: Diagnosis not present

## 2020-11-24 DIAGNOSIS — R5383 Other fatigue: Secondary | ICD-10-CM | POA: Diagnosis not present

## 2020-11-24 DIAGNOSIS — G4733 Obstructive sleep apnea (adult) (pediatric): Secondary | ICD-10-CM | POA: Diagnosis not present

## 2020-11-24 DIAGNOSIS — R918 Other nonspecific abnormal finding of lung field: Secondary | ICD-10-CM | POA: Diagnosis not present

## 2020-11-24 DIAGNOSIS — R601 Generalized edema: Secondary | ICD-10-CM | POA: Diagnosis not present

## 2020-11-24 DIAGNOSIS — J9611 Chronic respiratory failure with hypoxia: Secondary | ICD-10-CM | POA: Diagnosis not present

## 2020-11-25 DIAGNOSIS — N183 Chronic kidney disease, stage 3 unspecified: Secondary | ICD-10-CM | POA: Diagnosis not present

## 2020-11-25 DIAGNOSIS — E785 Hyperlipidemia, unspecified: Secondary | ICD-10-CM | POA: Diagnosis not present

## 2020-11-25 DIAGNOSIS — J449 Chronic obstructive pulmonary disease, unspecified: Secondary | ICD-10-CM | POA: Diagnosis not present

## 2020-11-25 DIAGNOSIS — C3490 Malignant neoplasm of unspecified part of unspecified bronchus or lung: Secondary | ICD-10-CM | POA: Diagnosis not present

## 2020-11-25 DIAGNOSIS — M1611 Unilateral primary osteoarthritis, right hip: Secondary | ICD-10-CM | POA: Diagnosis not present

## 2020-11-25 DIAGNOSIS — I10 Essential (primary) hypertension: Secondary | ICD-10-CM | POA: Diagnosis not present

## 2020-11-25 DIAGNOSIS — G4700 Insomnia, unspecified: Secondary | ICD-10-CM | POA: Diagnosis not present

## 2020-11-25 DIAGNOSIS — F325 Major depressive disorder, single episode, in full remission: Secondary | ICD-10-CM | POA: Diagnosis not present

## 2020-11-25 DIAGNOSIS — E890 Postprocedural hypothyroidism: Secondary | ICD-10-CM | POA: Diagnosis not present

## 2020-11-25 DIAGNOSIS — I129 Hypertensive chronic kidney disease with stage 1 through stage 4 chronic kidney disease, or unspecified chronic kidney disease: Secondary | ICD-10-CM | POA: Diagnosis not present

## 2020-11-25 DIAGNOSIS — M1612 Unilateral primary osteoarthritis, left hip: Secondary | ICD-10-CM | POA: Diagnosis not present

## 2020-11-26 DIAGNOSIS — I10 Essential (primary) hypertension: Secondary | ICD-10-CM | POA: Diagnosis not present

## 2020-11-26 DIAGNOSIS — B9562 Methicillin resistant Staphylococcus aureus infection as the cause of diseases classified elsewhere: Secondary | ICD-10-CM | POA: Diagnosis not present

## 2020-11-26 DIAGNOSIS — Z4801 Encounter for change or removal of surgical wound dressing: Secondary | ICD-10-CM | POA: Diagnosis not present

## 2020-11-26 DIAGNOSIS — J439 Emphysema, unspecified: Secondary | ICD-10-CM | POA: Diagnosis not present

## 2020-11-26 DIAGNOSIS — M7021 Olecranon bursitis, right elbow: Secondary | ICD-10-CM | POA: Diagnosis not present

## 2020-11-26 DIAGNOSIS — Z4789 Encounter for other orthopedic aftercare: Secondary | ICD-10-CM | POA: Diagnosis not present

## 2020-12-15 DIAGNOSIS — I10 Essential (primary) hypertension: Secondary | ICD-10-CM | POA: Diagnosis not present

## 2020-12-15 DIAGNOSIS — J9611 Chronic respiratory failure with hypoxia: Secondary | ICD-10-CM | POA: Diagnosis not present

## 2020-12-15 DIAGNOSIS — R601 Generalized edema: Secondary | ICD-10-CM | POA: Diagnosis not present

## 2020-12-15 DIAGNOSIS — E785 Hyperlipidemia, unspecified: Secondary | ICD-10-CM | POA: Diagnosis not present

## 2020-12-15 DIAGNOSIS — F411 Generalized anxiety disorder: Secondary | ICD-10-CM | POA: Diagnosis not present

## 2020-12-15 DIAGNOSIS — F325 Major depressive disorder, single episode, in full remission: Secondary | ICD-10-CM | POA: Diagnosis not present

## 2020-12-15 DIAGNOSIS — N183 Chronic kidney disease, stage 3 unspecified: Secondary | ICD-10-CM | POA: Diagnosis not present

## 2020-12-15 DIAGNOSIS — G4733 Obstructive sleep apnea (adult) (pediatric): Secondary | ICD-10-CM | POA: Diagnosis not present

## 2020-12-15 DIAGNOSIS — I129 Hypertensive chronic kidney disease with stage 1 through stage 4 chronic kidney disease, or unspecified chronic kidney disease: Secondary | ICD-10-CM | POA: Diagnosis not present

## 2020-12-15 DIAGNOSIS — R5383 Other fatigue: Secondary | ICD-10-CM | POA: Diagnosis not present

## 2020-12-15 DIAGNOSIS — J449 Chronic obstructive pulmonary disease, unspecified: Secondary | ICD-10-CM | POA: Diagnosis not present

## 2020-12-15 DIAGNOSIS — R051 Acute cough: Secondary | ICD-10-CM | POA: Diagnosis not present

## 2020-12-15 DIAGNOSIS — Z79899 Other long term (current) drug therapy: Secondary | ICD-10-CM | POA: Diagnosis not present

## 2020-12-15 DIAGNOSIS — C3490 Malignant neoplasm of unspecified part of unspecified bronchus or lung: Secondary | ICD-10-CM | POA: Diagnosis not present

## 2020-12-15 DIAGNOSIS — Z03818 Encounter for observation for suspected exposure to other biological agents ruled out: Secondary | ICD-10-CM | POA: Diagnosis not present

## 2020-12-15 DIAGNOSIS — G4700 Insomnia, unspecified: Secondary | ICD-10-CM | POA: Diagnosis not present

## 2020-12-15 DIAGNOSIS — R918 Other nonspecific abnormal finding of lung field: Secondary | ICD-10-CM | POA: Diagnosis not present

## 2020-12-15 DIAGNOSIS — E890 Postprocedural hypothyroidism: Secondary | ICD-10-CM | POA: Diagnosis not present

## 2021-01-05 ENCOUNTER — Ambulatory Visit: Payer: Self-pay | Admitting: Student

## 2021-01-05 DIAGNOSIS — S52021D Displaced fracture of olecranon process without intraarticular extension of right ulna, subsequent encounter for closed fracture with routine healing: Secondary | ICD-10-CM | POA: Diagnosis not present

## 2021-01-05 DIAGNOSIS — M7021 Olecranon bursitis, right elbow: Secondary | ICD-10-CM

## 2021-01-12 ENCOUNTER — Encounter (HOSPITAL_COMMUNITY): Payer: Self-pay | Admitting: Student

## 2021-01-12 ENCOUNTER — Other Ambulatory Visit (HOSPITAL_COMMUNITY)
Admission: RE | Admit: 2021-01-12 | Discharge: 2021-01-12 | Disposition: A | Discharge status: Home / Self Care | Payer: Medicare Other | Source: Ambulatory Visit | Attending: Student | Admitting: Student

## 2021-01-12 ENCOUNTER — Other Ambulatory Visit: Payer: Self-pay

## 2021-01-12 DIAGNOSIS — Z20822 Contact with and (suspected) exposure to covid-19: Secondary | ICD-10-CM | POA: Insufficient documentation

## 2021-01-12 DIAGNOSIS — Z01812 Encounter for preprocedural laboratory examination: Secondary | ICD-10-CM | POA: Diagnosis not present

## 2021-01-12 LAB — SARS CORONAVIRUS 2 (TAT 6-24 HRS): SARS Coronavirus 2: NEGATIVE

## 2021-01-12 NOTE — Progress Notes (Signed)
Holly [name] denies chest pain or shortness of breath. Patient was tested for Covid and has been in quarantine since that time.  Holly [name] has COPD, she wears oxygen 2 L, 24/7, and has for years.

## 2021-01-12 NOTE — Anesthesia Preprocedure Evaluation (Addendum)
Anesthesia Evaluation  Patient identified by MRN, date of birth, ID band Patient awake    Reviewed: Allergy & Precautions, H&P , NPO status , Patient's Chart, lab work & pertinent test results, reviewed documented beta blocker date and time   Airway Mallampati: II  TM Distance: >3 FB Neck ROM: Full    Dental no notable dental hx. (+) Edentulous Upper, Edentulous Lower, Dental Advisory Given   Pulmonary asthma , sleep apnea , COPD,  COPD inhaler and oxygen dependent, former smoker,    Pulmonary exam normal breath sounds clear to auscultation       Cardiovascular Exercise Tolerance: Good hypertension, Pt. on medications and Pt. on home beta blockers  Rhythm:Regular Rate:Normal     Neuro/Psych  Headaches, Anxiety    GI/Hepatic negative GI ROS, Neg liver ROS,   Endo/Other  negative endocrine ROSHypothyroidism   Renal/GU negative Renal ROS  negative genitourinary   Musculoskeletal   Abdominal   Peds  Hematology negative hematology ROS (+)   Anesthesia Other Findings   Reproductive/Obstetrics negative OB ROS                            Anesthesia Physical Anesthesia Plan  ASA: III  Anesthesia Plan: Regional and MAC   Post-op Pain Management:    Induction: Intravenous  PONV Risk Score and Plan: 2 and Propofol infusion and Ondansetron  Airway Management Planned: Simple Face Mask  Additional Equipment:   Intra-op Plan:   Post-operative Plan:   Informed Consent: I have reviewed the patients History and Physical, chart, labs and discussed the procedure including the risks, benefits and alternatives for the proposed anesthesia with the patient or authorized representative who has indicated his/her understanding and acceptance.     Dental advisory given  Plan Discussed with: CRNA  Anesthesia Plan Comments:        Anesthesia Quick Evaluation

## 2021-01-13 ENCOUNTER — Ambulatory Visit (HOSPITAL_COMMUNITY): Payer: Medicare Other | Admitting: Anesthesiology

## 2021-01-13 ENCOUNTER — Other Ambulatory Visit: Payer: Self-pay

## 2021-01-13 ENCOUNTER — Ambulatory Visit (HOSPITAL_COMMUNITY)
Admission: RE | Admit: 2021-01-13 | Discharge: 2021-01-13 | Disposition: A | Discharge status: Home / Self Care | Payer: Medicare Other | Attending: Student | Admitting: Student

## 2021-01-13 ENCOUNTER — Encounter (HOSPITAL_COMMUNITY): Payer: Self-pay | Admitting: Student

## 2021-01-13 ENCOUNTER — Encounter (HOSPITAL_COMMUNITY)
Admission: RE | Disposition: A | Discharge status: Home / Self Care | Payer: Self-pay | Source: Home / Self Care | Attending: Student

## 2021-01-13 DIAGNOSIS — M7021 Olecranon bursitis, right elbow: Secondary | ICD-10-CM | POA: Diagnosis not present

## 2021-01-13 DIAGNOSIS — Z85820 Personal history of malignant melanoma of skin: Secondary | ICD-10-CM | POA: Insufficient documentation

## 2021-01-13 DIAGNOSIS — Z9981 Dependence on supplemental oxygen: Secondary | ICD-10-CM | POA: Diagnosis not present

## 2021-01-13 DIAGNOSIS — I10 Essential (primary) hypertension: Secondary | ICD-10-CM | POA: Diagnosis not present

## 2021-01-13 DIAGNOSIS — Z87891 Personal history of nicotine dependence: Secondary | ICD-10-CM | POA: Diagnosis not present

## 2021-01-13 DIAGNOSIS — Z79899 Other long term (current) drug therapy: Secondary | ICD-10-CM | POA: Diagnosis not present

## 2021-01-13 DIAGNOSIS — E039 Hypothyroidism, unspecified: Secondary | ICD-10-CM | POA: Diagnosis not present

## 2021-01-13 DIAGNOSIS — J441 Chronic obstructive pulmonary disease with (acute) exacerbation: Secondary | ICD-10-CM | POA: Diagnosis not present

## 2021-01-13 DIAGNOSIS — S52021A Displaced fracture of olecranon process without intraarticular extension of right ulna, initial encounter for closed fracture: Secondary | ICD-10-CM | POA: Diagnosis not present

## 2021-01-13 DIAGNOSIS — E782 Mixed hyperlipidemia: Secondary | ICD-10-CM | POA: Diagnosis not present

## 2021-01-13 HISTORY — DX: Other specified soft tissue disorders: M79.89

## 2021-01-13 HISTORY — PX: I & D EXTREMITY: SHX5045

## 2021-01-13 LAB — CBC
HCT: 30.7 % — ABNORMAL LOW (ref 36.0–46.0)
Hemoglobin: 9.3 g/dL — ABNORMAL LOW (ref 12.0–15.0)
MCH: 31.1 pg (ref 26.0–34.0)
MCHC: 30.3 g/dL (ref 30.0–36.0)
MCV: 102.7 fL — ABNORMAL HIGH (ref 80.0–100.0)
Platelets: 149 10*3/uL — ABNORMAL LOW (ref 150–400)
RBC: 2.99 MIL/uL — ABNORMAL LOW (ref 3.87–5.11)
RDW: 17.8 % — ABNORMAL HIGH (ref 11.5–15.5)
WBC: 8.5 10*3/uL (ref 4.0–10.5)
nRBC: 0 % (ref 0.0–0.2)

## 2021-01-13 LAB — BASIC METABOLIC PANEL
Anion gap: 7 (ref 5–15)
BUN: 21 mg/dL (ref 8–23)
CO2: 42 mmol/L — ABNORMAL HIGH (ref 22–32)
Calcium: 10 mg/dL (ref 8.9–10.3)
Chloride: 92 mmol/L — ABNORMAL LOW (ref 98–111)
Creatinine, Ser: 1.12 mg/dL — ABNORMAL HIGH (ref 0.44–1.00)
GFR, Estimated: 51 mL/min — ABNORMAL LOW (ref >60)
Glucose, Bld: 212 mg/dL — ABNORMAL HIGH (ref 70–99)
Potassium: 4 mmol/L (ref 3.5–5.1)
Sodium: 141 mmol/L (ref 135–145)

## 2021-01-13 SURGERY — IRRIGATION AND DEBRIDEMENT EXTREMITY
Anesthesia: Monitor Anesthesia Care | Laterality: Right

## 2021-01-13 MED ORDER — PHENYLEPHRINE 40 MCG/ML (10ML) SYRINGE FOR IV PUSH (FOR BLOOD PRESSURE SUPPORT)
PREFILLED_SYRINGE | INTRAVENOUS | Status: DC | PRN
  Administered 2021-01-13: 80 ug via INTRAVENOUS

## 2021-01-13 MED ORDER — ORAL CARE MOUTH RINSE: 15.0000 mL | Freq: Once | OROMUCOSAL | Status: AC

## 2021-01-13 MED ORDER — EPHEDRINE 5 MG/ML INJ
INTRAVENOUS | Status: AC
  Filled 2021-01-13: qty 10

## 2021-01-13 MED ORDER — TRAMADOL HCL 50 MG PO TABS: 50.0000 mg | ORAL_TABLET | Freq: Four times a day (QID) | ORAL | 0 refills | Status: DC | PRN

## 2021-01-13 MED ORDER — FENTANYL CITRATE (PF) 100 MCG/2ML IJ SOLN: 25.0000 ug | INTRAMUSCULAR | Status: DC | PRN

## 2021-01-13 MED ORDER — CHLORHEXIDINE GLUCONATE 0.12 % MT SOLN: 15.0000 mL | Freq: Once | OROMUCOSAL | Status: AC

## 2021-01-13 MED ORDER — STERILE WATER FOR INJECTION IJ SOLN
INTRAVENOUS | Status: DC | PRN
  Administered 2021-01-13: 100 mg

## 2021-01-13 MED ORDER — DOXYCYCLINE MONOHYDRATE 100 MG PO TABS: 100.0000 mg | ORAL_TABLET | Freq: Two times a day (BID) | ORAL | 0 refills | Status: AC

## 2021-01-13 MED ORDER — LIDOCAINE-EPINEPHRINE (PF) 1.5 %-1:200000 IJ SOLN
INTRAMUSCULAR | Status: DC | PRN
  Administered 2021-01-13: 25 mL via PERINEURAL

## 2021-01-13 MED ORDER — EPHEDRINE SULFATE-NACL 50-0.9 MG/10ML-% IV SOSY
PREFILLED_SYRINGE | INTRAVENOUS | Status: DC | PRN
  Administered 2021-01-13: 5 mg via INTRAVENOUS

## 2021-01-13 MED ORDER — DEXAMETHASONE SODIUM PHOSPHATE 10 MG/ML IJ SOLN
INTRAMUSCULAR | Status: DC | PRN
  Administered 2021-01-13: 5 mg via INTRAVENOUS

## 2021-01-13 MED ORDER — LACTATED RINGERS IV SOLN: INTRAVENOUS | Status: DC

## 2021-01-13 MED ORDER — FENTANYL CITRATE (PF) 250 MCG/5ML IJ SOLN
INTRAMUSCULAR | Status: DC | PRN
  Administered 2021-01-13: 25 ug via INTRAVENOUS

## 2021-01-13 MED ORDER — DEXAMETHASONE SODIUM PHOSPHATE 10 MG/ML IJ SOLN
INTRAMUSCULAR | Status: AC
  Filled 2021-01-13: qty 1

## 2021-01-13 MED ORDER — ACETAMINOPHEN 500 MG PO TABS
ORAL_TABLET | ORAL | Status: AC
  Administered 2021-01-13: 1000 mg via ORAL
  Filled 2021-01-13: qty 2

## 2021-01-13 MED ORDER — ACETAMINOPHEN 500 MG PO TABS: 1000.0000 mg | ORAL_TABLET | Freq: Once | ORAL | Status: AC

## 2021-01-13 MED ORDER — METOPROLOL SUCCINATE ER 50 MG PO TB24
50.0000 mg | ORAL_TABLET | Freq: Once | ORAL | Status: AC
  Filled 2021-01-13: qty 1

## 2021-01-13 MED ORDER — 0.9 % SODIUM CHLORIDE (POUR BTL) OPTIME
TOPICAL | Status: DC | PRN
  Administered 2021-01-13: 1000 mL

## 2021-01-13 MED ORDER — MIDAZOLAM HCL 2 MG/2ML IJ SOLN
INTRAMUSCULAR | Status: AC
  Filled 2021-01-13: qty 2

## 2021-01-13 MED ORDER — MIDAZOLAM HCL 5 MG/5ML IJ SOLN
INTRAMUSCULAR | Status: DC | PRN
  Administered 2021-01-13: 1 mg via INTRAVENOUS

## 2021-01-13 MED ORDER — PROPOFOL 10 MG/ML IV BOLUS
INTRAVENOUS | Status: DC | PRN
  Administered 2021-01-13: 20 mg via INTRAVENOUS

## 2021-01-13 MED ORDER — ONDANSETRON HCL 4 MG/2ML IJ SOLN
INTRAMUSCULAR | Status: DC | PRN
  Administered 2021-01-13: 4 mg via INTRAVENOUS

## 2021-01-13 MED ORDER — VANCOMYCIN HCL 1000 MG IV SOLR
INTRAVENOUS | Status: AC
  Filled 2021-01-13: qty 1000

## 2021-01-13 MED ORDER — PHENYLEPHRINE 40 MCG/ML (10ML) SYRINGE FOR IV PUSH (FOR BLOOD PRESSURE SUPPORT)
PREFILLED_SYRINGE | INTRAVENOUS | Status: AC
  Filled 2021-01-13: qty 10

## 2021-01-13 MED ORDER — CEFAZOLIN SODIUM-DEXTROSE 2-4 GM/100ML-% IV SOLN
2.0000 g | INTRAVENOUS | Status: AC
  Administered 2021-01-13: 2 g via INTRAVENOUS

## 2021-01-13 MED ORDER — CEFAZOLIN SODIUM-DEXTROSE 2-4 GM/100ML-% IV SOLN
INTRAVENOUS | Status: AC
  Filled 2021-01-13: qty 100

## 2021-01-13 MED ORDER — PROPOFOL 500 MG/50ML IV EMUL
INTRAVENOUS | Status: DC | PRN
  Administered 2021-01-13: 75 ug/kg/min via INTRAVENOUS

## 2021-01-13 MED ORDER — CHLORHEXIDINE GLUCONATE 0.12 % MT SOLN
OROMUCOSAL | Status: AC
  Administered 2021-01-13: 15 mL via OROMUCOSAL
  Filled 2021-01-13: qty 15

## 2021-01-13 MED ORDER — ONDANSETRON HCL 4 MG/2ML IJ SOLN
INTRAMUSCULAR | Status: AC
  Filled 2021-01-13: qty 2

## 2021-01-13 MED ORDER — METOPROLOL SUCCINATE ER 25 MG PO TB24
ORAL_TABLET | ORAL | Status: AC
  Administered 2021-01-13: 50 mg via ORAL
  Filled 2021-01-13: qty 2

## 2021-01-13 MED ORDER — FENTANYL CITRATE (PF) 250 MCG/5ML IJ SOLN
INTRAMUSCULAR | Status: AC
  Filled 2021-01-13: qty 5

## 2021-01-13 SURGICAL SUPPLY — 49 items
APL PRP STRL LF DISP 70% ISPRP (MISCELLANEOUS) ×1
BNDG COHESIVE 4X5 TAN STRL (GAUZE/BANDAGES/DRESSINGS) ×2 IMPLANT
BNDG ELASTIC 3X5.8 VLCR STR LF (GAUZE/BANDAGES/DRESSINGS) ×1 IMPLANT
BNDG GAUZE ELAST 4 BULKY (GAUZE/BANDAGES/DRESSINGS) ×4 IMPLANT
BRUSH SCRUB EZ PLAIN DRY (MISCELLANEOUS) ×4 IMPLANT
CHLORAPREP W/TINT 26 (MISCELLANEOUS) ×2 IMPLANT
COVER MAYO STAND STRL (DRAPES) ×2 IMPLANT
COVER SURGICAL LIGHT HANDLE (MISCELLANEOUS) ×4 IMPLANT
COVER WAND RF STERILE (DRAPES) ×2 IMPLANT
DRAPE ORTHO SPLIT 77X108 STRL (DRAPES) ×2
DRAPE SURG 17X23 STRL (DRAPES) ×2 IMPLANT
DRAPE SURG ORHT 6 SPLT 77X108 (DRAPES) ×1 IMPLANT
DRAPE U-SHAPE 47X51 STRL (DRAPES) ×2 IMPLANT
DRSG ADAPTIC 3X8 NADH LF (GAUZE/BANDAGES/DRESSINGS) ×2 IMPLANT
ELECT REM PT RETURN 9FT ADLT (ELECTROSURGICAL)
ELECTRODE REM PT RTRN 9FT ADLT (ELECTROSURGICAL) IMPLANT
EVACUATOR 1/8 PVC DRAIN (DRAIN) IMPLANT
GAUZE SPONGE 4X4 12PLY STRL (GAUZE/BANDAGES/DRESSINGS) ×2 IMPLANT
GLOVE BIO SURGEON STRL SZ 6.5 (GLOVE) ×6 IMPLANT
GLOVE BIO SURGEON STRL SZ7.5 (GLOVE) ×8 IMPLANT
GLOVE BIOGEL PI IND STRL 7.5 (GLOVE) ×1 IMPLANT
GLOVE BIOGEL PI INDICATOR 7.5 (GLOVE) ×1
GLOVE SURG UNDER POLY LF SZ6.5 (GLOVE) ×2 IMPLANT
GOWN STRL REUS W/ TWL LRG LVL3 (GOWN DISPOSABLE) ×2 IMPLANT
GOWN STRL REUS W/TWL LRG LVL3 (GOWN DISPOSABLE) ×4
HANDPIECE INTERPULSE COAX TIP (DISPOSABLE)
KIT BASIN OR (CUSTOM PROCEDURE TRAY) ×2 IMPLANT
KIT TURNOVER KIT B (KITS) ×2 IMPLANT
MANIFOLD NEPTUNE II (INSTRUMENTS) ×2 IMPLANT
NS IRRIG 1000ML POUR BTL (IV SOLUTION) ×2 IMPLANT
PACK ORTHO EXTREMITY (CUSTOM PROCEDURE TRAY) ×2 IMPLANT
PAD ABD 7.5X8 STRL (GAUZE/BANDAGES/DRESSINGS) ×1 IMPLANT
PAD ARMBOARD 7.5X6 YLW CONV (MISCELLANEOUS) ×4 IMPLANT
PADDING CAST COTTON 6X4 STRL (CAST SUPPLIES) ×2 IMPLANT
SET HNDPC FAN SPRY TIP SCT (DISPOSABLE) IMPLANT
SPLINT PLASTER CAST XFAST 5X30 (CAST SUPPLIES) IMPLANT
SPLINT PLASTER XFAST SET 5X30 (CAST SUPPLIES) ×1
SPONGE LAP 18X18 RF (DISPOSABLE) ×2 IMPLANT
SUT ETHILON 2 0 FS 18 (SUTURE) ×4 IMPLANT
SUT ETHILON 3 0 PS 1 (SUTURE) ×4 IMPLANT
SUT MON AB 2-0 CT1 36 (SUTURE) ×2 IMPLANT
SUT PDS AB 0 CT 36 (SUTURE) IMPLANT
SWAB CULTURE ESWAB REG 1ML (MISCELLANEOUS) IMPLANT
TOWEL GREEN STERILE (TOWEL DISPOSABLE) ×4 IMPLANT
TOWEL GREEN STERILE FF (TOWEL DISPOSABLE) ×2 IMPLANT
TUBE CONNECTING 12X1/4 (SUCTIONS) ×2 IMPLANT
UNDERPAD 30X36 HEAVY ABSORB (UNDERPADS AND DIAPERS) ×2 IMPLANT
WATER STERILE IRR 1000ML POUR (IV SOLUTION) ×2 IMPLANT
YANKAUER SUCT BULB TIP NO VENT (SUCTIONS) ×2 IMPLANT

## 2021-01-13 NOTE — H&P (Signed)
Orthopaedic Trauma Service (OTS) Consult   Patient ID: Holly [name] MRN: [id number] DOB/AGE: [birth year] 75 y.o.  Reason for Surgery: Right olecranon bursitis  HPI: Holly [name] is an 75 y.o. female who presents with persistent drainage from her olecranon bursa.  The patient initially underwent open reduction internal fixation of right olecranon fracture in 2019.  She subsequently started developed drainage and infection from her wound and the summer 2021.  I subsequently took her for I&D with removal of hardware and closure with excision of the olecranon bursa on December 2021.  She subsequently continued to develop drainage from that area over the last month to 6 weeks.  As result of the continued drainage I recommended proceeding for repeat irrigation and debridement and excision of bursitis.  Past Medical History:  Diagnosis Date  . Anxiety   . Asthma   . Basal cell carcinoma    lung cancer.  Skin cancer- basal - nose removed  . Bruises easily   . Cataract   . Cervicalgia   . 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 Dyspnea    uses O2 only when needed. Uses 2 L   . Foot swelling    left- has shown to Drs.  . Frequency of urination   . Headache   . 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 Hypercalcemia   . Hypertension   . Hypothyroidism   . Mixed hyperlipidemia   . Pneumonia   . Pulmonary nodule   . Sleep apnea    does not wear cpap  . Thyroid neoplasm   . Urinary incontinence   . Wears dentures   . Wears glasses     Past Surgical History:  Procedure Laterality Date  . COLONOSCOPY    . DENTAL SURGERY    . ELBOW ARTHROPLASTY Right 2020  . EYE SURGERY Right    cataract surgery  . FOOT SURGERY  30 YRS AGO   BILATERAL  . HARDWARE REMOVAL Right 10/02/2020   Procedure: HARDWARE REMOVAL RIGHT ELBOW;  Surgeon: Shona Needles, MD;  Location: [facility];  Service: Orthopedics;  Laterality: Right;  . ORIF ELBOW FRACTURE Right  12/06/2017   Procedure: OPEN REDUCTION INTERNAL FIXATION (ORIF) ELBOW/OLECRANON FRACTURE;  Surgeon: Shona Needles, MD;  Location: [facility];  Service: Orthopedics;  Laterality: Right;  . SKIN CANCER EXCISION    . THYROIDECTOMY N/A 07/31/2014   Procedure: TOTAL THYROIDECTOMY;  Surgeon: Armandina Gemma, MD;  Location: [facility];  Service: General;  Laterality: N/A;  . VAGINAL HYSTERECTOMY    . VIDEO BRONCHOSCOPY WITH ENDOBRONCHIAL NAVIGATION Bilateral 06/25/2019   Procedure: VIDEO BRONCHOSCOPY WITH ENDOBRONCHIAL NAVIGATION;  Surgeon: Lajuana Matte, MD;  Location: [facility];  Service: Thoracic;  Laterality: Bilateral;    Family History  Problem Relation Age of Onset  . Coronary artery disease Father   . Hypertension Father   . Asthma Father   . Allergies Father   . Hypertension Mother   . Lung cancer Mother     Social History:  reports that she quit smoking about 5 years ago. Her smoking use included cigarettes. She has a 30.00 pack-year smoking history. She has never used smokeless tobacco. She reports current alcohol use. She reports that she does not use drugs.  Allergies:  Allergies  Allergen Reactions  . Amlodipine Besylate Other (See Comments)    Other reaction(s): edema  . Codeine Nausea And Vomiting  . Oxycodone-Acetaminophen Nausea And  Vomiting    Medications: I have reviewed the patient's current medications.  ROS: Constitutional: No fever or chills Vision: No changes in vision ENT: No difficulty swallowing CV: No chest pain Pulm: No SOB or wheezing GI: No nausea or vomiting GU: No urgency or inability to hold urine Skin: No poor wound healing Neurologic: No numbness or tingling Psychiatric: No depression or anxiety Heme: No bruising Allergic: No reaction to medications or food   Exam: Blood pressure (!) 117/51, pulse 70, temperature 97.8 F (36.6 C), temperature source Oral, resp. rate 18, height 4\' 11"  (1.499 m), weight 77.1 kg, SpO2 100 %. General: No acute  distress Orientation: Awake alert and oriented x3 Mood and Affect: Cooperative and pleasant Gait: Within normal limits Coordination and balance: Within normal limits  Right upper extremity: Small area of persistent clear drainage with some fluctuance underneath the elbow.  Full elbow range of motion.  Neurovascularly intact.  Left upper extremity: Skin without lesions. No tenderness to palpation. Full painless ROM, full strength in each muscle groups without evidence of instability.   Medical Decision Making: Data: Imaging: No changes to the imaging  Labs:  Results for orders placed or performed during the hospital encounter of 01/12/21 (from the past 24 hour(s))  SARS CORONAVIRUS 2 (TAT 6-24 HRS) Nasopharyngeal Nasopharyngeal Swab     Status: None   Collection Time: 01/12/21  8:26 AM   Specimen: Nasopharyngeal Swab  Result Value Ref Range   SARS Coronavirus 2 NEGATIVE NEGATIVE    Imaging or Labs ordered: None  Medical history and chart was reviewed and case discussed with medical provider.  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  I discussed with the patient and her daughter proceeding to the operating room for repeat irrigation debridement of her right olecranon bursa.  I discussed with them risks and benefits including risk of bleeding, infection, persistent drainage, elbow stiffness, nerve and blood vessel injury, even possibility anesthetic complications.  Patient agreed to proceed with surgery and consent was obtained.  We will plan to splint the patient postoperatively.  We will take intraoperative cultures and discharge home postop.  Shona Needles, MD [facility] [phone] (office) [email or web address]

## 2021-01-13 NOTE — Anesthesia Procedure Notes (Signed)
Procedure Name: MAC Date/Time: 01/13/2021 7:35 AM Performed by: Trinna Post., CRNA Pre-anesthesia Checklist: Patient identified, Emergency Drugs available, Suction available, Patient being monitored and Timeout performed Patient Re-evaluated:Patient Re-evaluated prior to induction Oxygen Delivery Method: Simple face mask Preoxygenation: Pre-oxygenation with 100% oxygen Induction Type: IV induction Placement Confirmation: positive ETCO2

## 2021-01-13 NOTE — Anesthesia Postprocedure Evaluation (Signed)
Anesthesia Post Note  Patient: Holly [name]  Procedure(s) Performed: IRRIGATION AND DEBRIDEMENT RIGHT ELBOW (Right )     Patient location during evaluation: PACU Anesthesia Type: Regional and MAC Level of consciousness: awake and alert Pain management: pain level controlled Vital Signs Assessment: post-procedure vital signs reviewed and stable Respiratory status: spontaneous breathing, nonlabored ventilation, respiratory function stable and patient connected to nasal cannula oxygen Cardiovascular status: stable and blood pressure returned to baseline Postop Assessment: no apparent nausea or vomiting Anesthetic complications: no   No complications documented.  Last Vitals:  Vitals:   01/13/21 0854 01/13/21 0909  BP: (!) 102/56 105/63  Pulse: 66 60  Resp: 20 (!) 21  Temp: 36.4 C 36.4 C  SpO2: 100% 99%    Last Pain:  Vitals:   01/13/21 0909  TempSrc:   PainSc: 0-No pain                 Oliviagrace Crisanti,W. EDMOND

## 2021-01-13 NOTE — Anesthesia Procedure Notes (Signed)
Anesthesia Regional Block: Supraclavicular block   Pre-Anesthetic Checklist: ,, timeout performed, Correct Patient, Correct Site, Correct Laterality, Correct Procedure, Correct Position, site marked, Risks and benefits discussed, pre-op evaluation,  At surgeon's request and post-op pain management  Laterality: Right  Prep: Maximum Sterile Barrier Precautions used, chloraprep       Needles:  Injection technique: Single-shot  Needle Type: Echogenic Stimulator Needle     Needle Length: 9cm  Needle Gauge: 21     Additional Needles:   Procedures:,,,, ultrasound used (permanent image in chart),,,,  Narrative:  Start time: 01/13/2021 7:09 AM End time: 01/13/2021 7:19 AM Injection made incrementally with aspirations every 5 mL. Anesthesiologist: Roderic Palau, MD  Additional Notes: 2% Lidocaine skin wheel.

## 2021-01-13 NOTE — Op Note (Signed)
Orthopaedic Surgery Operative Note (CSN: [id number] ) Date of Surgery: 01/13/2021  Admit Date: 01/13/2021   Diagnoses: Pre-Op Diagnoses: Recurrent olecranon bursitis of right elbow   Post-Op Diagnosis: Same  Procedures: CPT 24105-Excision of right elbow olecranon bursa  Surgeons : Primary: Shona Needles, MD  Assistant: Patrecia Pace, PA-C  Location: OR 7   Anesthesia:Regional with sedation  Antibiotics: Ancef 2g preop with 100mg  doxycycline placed topically as sclerosing agent   Tourniquet time:None used    Estimated Blood CHEN:27 mL  Complications:* No complications entered in OR log *   Specimens: ID Type Source Tests Collected by Time Destination  A : right elbow wound Tissue PATH Soft tissue AEROBIC/ANAEROBIC CULTURE W GRAM STAIN (SURGICAL/DEEP WOUND) Shona Needles, MD 01/13/2021 0803      Implants: * No implants in log *   Indications for Surgery: 75 year old female who sustained a right olecranon fracture in 2019 that I performed an open reduction internal fixation.  She did well until last year in 2021 when she developed a swelling and drainage from her wound.  She developed septic olecranon bursitis.  I took her to the operating room for irrigation debridement excision of olecranon bursa and a removal of hardware in December 2021.  She subsequently redeveloped the bursitis with persistent drainage.  Due to the continued drainage that did not improve I recommended revision excision of the olecranon bursa.  Risks and benefits were discussed with the patient and her daughter.  Risks included but not limited to bleeding, infection, recurrence of the bursitis, nerve and blood vessel injury, even the possibility anesthetic complications.  Patient agreed to proceed with surgery and consent was obtained.  Operative Findings: Recurrent olecranon bursitis treated with excision of the bursa with cultures taken of the fluid and tissue.  Placement of 100 mg of doxycycline as a  sclerosing agent.  Procedure: The patient was identified in the preoperative holding area. Consent was confirmed with the patient and their family and all questions were answered. The operative extremity was marked after confirmation with the patient. she was then brought back to the operating room by our anesthesia colleagues.  She was carefully transferred over to a radiolucent flat top table.  She was placed under sedation.  The right upper extremity was then prepped and draped in usual sterile fashion.  A timeout was performed to verify the patient, the procedure, and the extremity.  Preoperative antibiotics were dosed.  I marked out the previous incision and incised this using a 10 blade.  Encountered fluid and some tissue that I collected and sent for culture.  Once I opened up the wound there was clear bursa in place and I started to excise the bursa down to bleeding tissue.  I excised the entirety of the bursa and I proceeded to use a Cobb elevator to debride the soft tissue to try to improve the healing rate.  I then irrigated the wound thoroughly with a low pressure pulsatile lavage.  I then placed 100 mg of doxycycline powder to assist with scarring and sclerosis.  I then used a 2-0 Monocryl and tacked the subcutaneous layer together down to the fascia and muscle.  I tried to do this to prevent potential space from developing.  I then used 2-0 Monocryl to do a subcutaneous stitch.  I then used a 3-0 nylon to close the skin and interrupted fashion.  A sterile dressing was then placed.  The patient's arm was then placed into a long-arm splint to prevent  motion for the first 2 weeks.  The patient was then awoken from anesthesia and taken to the PACU in stable condition.   Debridement type: Excisional Debridement  Side: right  Body Location: Elbow  Tools used for debridement: scalpel and scissors  Pre-debridement Wound size (cm):   N/A  Post-debridement Wound size (cm):   N/A  Debridement  depth beyond dead/damaged tissue down to healthy viable tissue: yes  Tissue layer involved: skin, subcutaneous tissue  Nature of tissue removed: Other: bursa  Irrigation volume: 2L     Irrigation fluid type: Normal Saline   Post Op Plan/Instructions: The patient will be splinted for the next 2 weeks to try to allow the soft tissue to scar down.  Plan to see her back at that time for wound check and likely suture removal.  We will follow up her cultures and send her home with oral antibiotics.  She will discharged home from the PACU.  No DVT prophylaxis is necessary in this ambulatory patient.  I was present and performed the entire surgery.  Patrecia Pace, PA-C did assist me throughout the case. An assistant was necessary given the difficulty in approach, and ability to excise the soft tissue.   Katha Hamming, MD [facility]

## 2021-01-13 NOTE — Transfer of Care (Signed)
Immediate Anesthesia Transfer of Care Note  Patient: Holly [name]  Procedure(s) Performed: IRRIGATION AND DEBRIDEMENT RIGHT ELBOW (Right )  Patient Location: PACU  Anesthesia Type:MAC and Regional  Level of Consciousness: awake, alert  and oriented  Airway & Oxygen Therapy: Patient Spontanous Breathing and Patient connected to face mask oxygen  Post-op Assessment: Report given to RN and Post -op Vital signs reviewed and stable  Post vital signs: Reviewed and stable  Last Vitals:  Vitals Value Taken Time  BP 102/56 01/13/21 0854  Temp    Pulse 67 01/13/21 0856  Resp 25 01/13/21 0856  SpO2 100 % 01/13/21 0856  Vitals shown include unvalidated device data.  Last Pain:  Vitals:   01/13/21 0655  TempSrc:   PainSc: 0-No pain         Complications: No complications documented.

## 2021-01-13 NOTE — Discharge Instructions (Addendum)
Katha Hamming, MD Patrecia Pace PA-C [facility] [place] [phone] [phone] (fax)                                  POST-OPERATIVE INSTRUCTIONS   WEIGHT BEARING STATUS: Non-weightbearing right arm  WOUND CARE ? Please keep splint clean dry and intact until follow-up. If your splint gets wet for any reason please contact the office immediately.  Do not stick anything down your splint such as pencils, momey, hangers to try and scratch yourself.  If you feel itchy take Benadryl as prescribed on the bottle for itching ? You may shower on Post-Op Day #2.  ? You must keep splint dry during this process and may find that a plastic bag taped around the extremity or alternatively a towel based bath may be a better option.   ? If you get your splint wet or if it is damaged please contact our clinic.  EXERCISES ? Due to your splint being in place you will not be able to bear weight through your extremity.   Please use your sling until follow-up. ? Please continue to work on range of motion of your fingers and stretch these multiple times a day to prevent stiffness. ? Please continue to ambulate and do not stay sitting or lying for too long. Perform foot and wrist pumps to assist in circulation.  DVT/PE prophylaxis: Aspirin  DIET: As you were eating previously.  Can use over the counter stool softeners and bowel preparations, such as Miralax, to help with bowel movements.  Narcotics can be constipating.  Be sure to drink plenty of fluids  REGIONAL ANESTHESIA (NERVE BLOCKS)  The anesthesia team may have performed a nerve block for you if safe in the setting of your care.  This is a great tool used to minimize pain.  Typically the block may start wearing off overnight but the long acting medicine may last for 3-4 days.  The nerve block wearing off can be a challenging period but please utilize your as needed pain medications to try and manage this period.     ICE AND ELEVATE INJURED/OPERATIVE EXTREMITY  Using ice and elevating the injured extremity above your heart can help with swelling and pain control.  Icing in a pulsatile fashion, such as 20 minutes on and 20 minutes off, can be followed.    Do not place ice directly on skin. Make sure there is a barrier between to skin and the ice pack.    Using frozen items such as frozen peas works well as the conform nicely to the are that needs to be iced.  POST-OP MEDICATIONS- Multimodal approach to pain control  In general your pain will be controlled with a combination of substances.  Prescriptions unless otherwise discussed are electronically sent to your pharmacy.  This is a carefully made plan we use to minimize narcotic use.                     -   Acetaminophen - Non-narcotic pain medicine taken on a scheduled basis        -   Oxycodone - This is a strong narcotic, to be used only on an "as needed" basis for pain.                  -   Robaxin -  Muscle relaxer taken  on an "as needed" basis for muscle spasms                  -   Gabapentin - Helps with nerve pain, such as burning or tingling feeling in the operative arm             STOP SMOKING OR USING NICOTINE PRODUCTS!!!!  As discussed nicotine severely impairs your body's ability to heal surgical and traumatic wounds but also impairs bone healing.  Wounds and bone heal by forming microscopic blood vessels (angiogenesis) and nicotine is a vasoconstrictor (essentially, shrinks blood vessels).  Therefore, if vasoconstriction occurs to these microscopic blood vessels they essentially disappear and are unable to deliver necessary nutrients to the healing tissue.  This is one modifiable factor that you can do to dramatically increase your chances of healing your injury.    (This means no smoking, no nicotine gum, patches, etc)   FOLLOW-UP ? If you develop a Fever (>101.5), Redness or Drainage from the surgical incision site, please call our  office to arrange for an evaluation. ? Please call the office to schedule a follow-up appointment for your incision check if you do not already have one, 7-10 days post-operatively.  CALL THE OFFICE WITH ANY QUESTIONS OR CONCERNS: [phone]   VISIT OUR WEBSITE FOR ADDITIONAL INFORMATION: [email or web address]    OTHER HELPFUL INFORMATION   If you had a block, it will wear off between 8-24 hrs postop typically.  This is period when your pain may go from nearly zero to the pain you would have had postop without the block.  This is an abrupt transition but nothing dangerous is happening.  You may take an extra dose of narcotic when this happens.   You may be more comfortable sleeping in a semi-seated position the first few nights following surgery.  Keep a pillow propped under the elbow and forearm for comfort.  If you have a recliner type of chair it might be beneficial.  If not that is fine too, but it would be helpful to sleep propped up with pillows behind your operated shoulder as well under your elbow and forearm.  This will reduce pulling on the suture lines.   When dressing, put your operative arm in the sleeve first.  When getting undressed, take your operative arm out last.  Loose fitting, button-down shirts are recommended.  Often in the first days after surgery you may be more comfortable keeping your operative arm under your shirt and not through the sleeve.   You may return to work/school in the next couple of days when you feel up to it.  Desk work and typing in the sling is fine.   We suggest you use the pain medication the first night prior to going to bed, in order to ease any pain when the anesthesia wears off. You should avoid taking pain medications on an empty stomach as it will make you nauseous.  You should wean off your narcotic medicines as soon as you are able.  Most patients will be off or using minimal narcotics before their first postop appointment.    Do not  drink alcoholic beverages or take illicit drugs when taking pain medications.   It is against the law to drive while taking narcotics.  In some states it is against the law to drive while your arm is in a sling.    Pain medication may make you constipated.  Below are a few solutions to try in this order:  Decrease the amount of pain medication if you aren't having pain.  Drink lots of decaffeinated fluids.  Drink prune juice and/or each dried prunes   If the first 3 don't work start with additional solutions -   Take Colace - an over-the-counter stool softener -   Take Senokot - an over-the-counter laxative -   Take Miralax - a stronger over-the-counter laxative

## 2021-01-14 ENCOUNTER — Encounter (HOSPITAL_COMMUNITY): Payer: Self-pay | Admitting: Student

## 2021-01-18 LAB — AEROBIC/ANAEROBIC CULTURE W GRAM STAIN (SURGICAL/DEEP WOUND)

## 2021-02-01 DIAGNOSIS — N183 Chronic kidney disease, stage 3 unspecified: Secondary | ICD-10-CM | POA: Diagnosis not present

## 2021-02-01 DIAGNOSIS — G4700 Insomnia, unspecified: Secondary | ICD-10-CM | POA: Diagnosis not present

## 2021-02-01 DIAGNOSIS — C3490 Malignant neoplasm of unspecified part of unspecified bronchus or lung: Secondary | ICD-10-CM | POA: Diagnosis not present

## 2021-02-01 DIAGNOSIS — M1611 Unilateral primary osteoarthritis, right hip: Secondary | ICD-10-CM | POA: Diagnosis not present

## 2021-02-01 DIAGNOSIS — M1612 Unilateral primary osteoarthritis, left hip: Secondary | ICD-10-CM | POA: Diagnosis not present

## 2021-02-01 DIAGNOSIS — I10 Essential (primary) hypertension: Secondary | ICD-10-CM | POA: Diagnosis not present

## 2021-02-01 DIAGNOSIS — J449 Chronic obstructive pulmonary disease, unspecified: Secondary | ICD-10-CM | POA: Diagnosis not present

## 2021-02-01 DIAGNOSIS — F325 Major depressive disorder, single episode, in full remission: Secondary | ICD-10-CM | POA: Diagnosis not present

## 2021-02-01 DIAGNOSIS — I129 Hypertensive chronic kidney disease with stage 1 through stage 4 chronic kidney disease, or unspecified chronic kidney disease: Secondary | ICD-10-CM | POA: Diagnosis not present

## 2021-02-01 DIAGNOSIS — E890 Postprocedural hypothyroidism: Secondary | ICD-10-CM | POA: Diagnosis not present

## 2021-02-01 DIAGNOSIS — E785 Hyperlipidemia, unspecified: Secondary | ICD-10-CM | POA: Diagnosis not present

## 2021-02-02 DIAGNOSIS — R918 Other nonspecific abnormal finding of lung field: Secondary | ICD-10-CM | POA: Diagnosis not present

## 2021-02-02 DIAGNOSIS — R601 Generalized edema: Secondary | ICD-10-CM | POA: Diagnosis not present

## 2021-02-02 DIAGNOSIS — J9611 Chronic respiratory failure with hypoxia: Secondary | ICD-10-CM | POA: Diagnosis not present

## 2021-02-02 DIAGNOSIS — G4733 Obstructive sleep apnea (adult) (pediatric): Secondary | ICD-10-CM | POA: Diagnosis not present

## 2021-02-02 DIAGNOSIS — R5383 Other fatigue: Secondary | ICD-10-CM | POA: Diagnosis not present

## 2021-02-02 DIAGNOSIS — F411 Generalized anxiety disorder: Secondary | ICD-10-CM | POA: Diagnosis not present

## 2021-02-02 DIAGNOSIS — J449 Chronic obstructive pulmonary disease, unspecified: Secondary | ICD-10-CM | POA: Diagnosis not present

## 2021-02-09 DIAGNOSIS — R600 Localized edema: Secondary | ICD-10-CM | POA: Diagnosis not present

## 2021-02-09 DIAGNOSIS — R110 Nausea: Secondary | ICD-10-CM | POA: Diagnosis not present

## 2021-02-09 DIAGNOSIS — I10 Essential (primary) hypertension: Secondary | ICD-10-CM | POA: Diagnosis not present

## 2021-02-09 DIAGNOSIS — L97921 Non-pressure chronic ulcer of unspecified part of left lower leg limited to breakdown of skin: Secondary | ICD-10-CM | POA: Diagnosis not present

## 2021-02-09 DIAGNOSIS — F419 Anxiety disorder, unspecified: Secondary | ICD-10-CM | POA: Diagnosis not present

## 2021-02-10 DIAGNOSIS — J449 Chronic obstructive pulmonary disease, unspecified: Secondary | ICD-10-CM | POA: Diagnosis present

## 2021-02-10 DIAGNOSIS — R933 Abnormal findings on diagnostic imaging of other parts of digestive tract: Secondary | ICD-10-CM | POA: Diagnosis not present

## 2021-02-10 DIAGNOSIS — K219 Gastro-esophageal reflux disease without esophagitis: Secondary | ICD-10-CM | POA: Diagnosis present

## 2021-02-10 DIAGNOSIS — K921 Melena: Secondary | ICD-10-CM | POA: Diagnosis not present

## 2021-02-10 DIAGNOSIS — K254 Chronic or unspecified gastric ulcer with hemorrhage: Secondary | ICD-10-CM | POA: Diagnosis not present

## 2021-02-10 DIAGNOSIS — Z9981 Dependence on supplemental oxygen: Secondary | ICD-10-CM | POA: Diagnosis not present

## 2021-02-10 DIAGNOSIS — Z972 Presence of dental prosthetic device (complete) (partial): Secondary | ICD-10-CM | POA: Diagnosis not present

## 2021-02-10 DIAGNOSIS — K5732 Diverticulitis of large intestine without perforation or abscess without bleeding: Secondary | ICD-10-CM | POA: Diagnosis not present

## 2021-02-10 DIAGNOSIS — N170 Acute kidney failure with tubular necrosis: Secondary | ICD-10-CM | POA: Diagnosis not present

## 2021-02-10 DIAGNOSIS — E782 Mixed hyperlipidemia: Secondary | ICD-10-CM | POA: Diagnosis not present

## 2021-02-10 DIAGNOSIS — Z85118 Personal history of other malignant neoplasm of bronchus and lung: Secondary | ICD-10-CM | POA: Diagnosis not present

## 2021-02-10 DIAGNOSIS — R531 Weakness: Secondary | ICD-10-CM | POA: Diagnosis not present

## 2021-02-10 DIAGNOSIS — E278 Other specified disorders of adrenal gland: Secondary | ICD-10-CM | POA: Diagnosis not present

## 2021-02-10 DIAGNOSIS — Z8701 Personal history of pneumonia (recurrent): Secondary | ICD-10-CM | POA: Diagnosis not present

## 2021-02-10 DIAGNOSIS — S33140A Subluxation of L4/L5 lumbar vertebra, initial encounter: Secondary | ICD-10-CM | POA: Diagnosis not present

## 2021-02-10 DIAGNOSIS — Z79890 Hormone replacement therapy (postmenopausal): Secondary | ICD-10-CM | POA: Diagnosis not present

## 2021-02-10 DIAGNOSIS — K2980 Duodenitis without bleeding: Secondary | ICD-10-CM | POA: Diagnosis not present

## 2021-02-10 DIAGNOSIS — F419 Anxiety disorder, unspecified: Secondary | ICD-10-CM | POA: Diagnosis not present

## 2021-02-10 DIAGNOSIS — A419 Sepsis, unspecified organism: Secondary | ICD-10-CM | POA: Diagnosis not present

## 2021-02-10 DIAGNOSIS — R197 Diarrhea, unspecified: Secondary | ICD-10-CM | POA: Diagnosis not present

## 2021-02-10 DIAGNOSIS — L97922 Non-pressure chronic ulcer of unspecified part of left lower leg with fat layer exposed: Secondary | ICD-10-CM | POA: Diagnosis not present

## 2021-02-10 DIAGNOSIS — Z7982 Long term (current) use of aspirin: Secondary | ICD-10-CM | POA: Diagnosis not present

## 2021-02-10 DIAGNOSIS — Z7952 Long term (current) use of systemic steroids: Secondary | ICD-10-CM | POA: Diagnosis not present

## 2021-02-10 DIAGNOSIS — Z87891 Personal history of nicotine dependence: Secondary | ICD-10-CM | POA: Diagnosis not present

## 2021-02-10 DIAGNOSIS — F0390 Unspecified dementia without behavioral disturbance: Secondary | ICD-10-CM | POA: Diagnosis not present

## 2021-02-10 DIAGNOSIS — J9611 Chronic respiratory failure with hypoxia: Secondary | ICD-10-CM | POA: Diagnosis present

## 2021-02-10 DIAGNOSIS — N179 Acute kidney failure, unspecified: Secondary | ICD-10-CM | POA: Diagnosis not present

## 2021-02-10 DIAGNOSIS — E039 Hypothyroidism, unspecified: Secondary | ICD-10-CM | POA: Diagnosis present

## 2021-02-10 DIAGNOSIS — J439 Emphysema, unspecified: Secondary | ICD-10-CM | POA: Diagnosis not present

## 2021-02-10 DIAGNOSIS — K2100 Gastro-esophageal reflux disease with esophagitis, without bleeding: Secondary | ICD-10-CM | POA: Diagnosis not present

## 2021-02-10 DIAGNOSIS — Z79899 Other long term (current) drug therapy: Secondary | ICD-10-CM | POA: Diagnosis not present

## 2021-02-10 DIAGNOSIS — D649 Anemia, unspecified: Secondary | ICD-10-CM | POA: Diagnosis not present

## 2021-02-10 DIAGNOSIS — K449 Diaphragmatic hernia without obstruction or gangrene: Secondary | ICD-10-CM | POA: Diagnosis not present

## 2021-02-10 DIAGNOSIS — E785 Hyperlipidemia, unspecified: Secondary | ICD-10-CM | POA: Diagnosis present

## 2021-02-10 DIAGNOSIS — K5730 Diverticulosis of large intestine without perforation or abscess without bleeding: Secondary | ICD-10-CM | POA: Diagnosis not present

## 2021-02-10 DIAGNOSIS — G4730 Sleep apnea, unspecified: Secondary | ICD-10-CM | POA: Diagnosis not present

## 2021-02-10 DIAGNOSIS — R109 Unspecified abdominal pain: Secondary | ICD-10-CM | POA: Diagnosis not present

## 2021-02-10 DIAGNOSIS — F329 Major depressive disorder, single episode, unspecified: Secondary | ICD-10-CM | POA: Diagnosis present

## 2021-02-10 DIAGNOSIS — D62 Acute posthemorrhagic anemia: Secondary | ICD-10-CM | POA: Diagnosis present

## 2021-02-10 DIAGNOSIS — E890 Postprocedural hypothyroidism: Secondary | ICD-10-CM | POA: Diagnosis not present

## 2021-02-10 DIAGNOSIS — K5733 Diverticulitis of large intestine without perforation or abscess with bleeding: Secondary | ICD-10-CM | POA: Diagnosis not present

## 2021-02-10 DIAGNOSIS — I959 Hypotension, unspecified: Secondary | ICD-10-CM | POA: Diagnosis not present

## 2021-02-10 DIAGNOSIS — L89312 Pressure ulcer of right buttock, stage 2: Secondary | ICD-10-CM | POA: Diagnosis not present

## 2021-02-10 DIAGNOSIS — I10 Essential (primary) hypertension: Secondary | ICD-10-CM | POA: Diagnosis present

## 2021-02-10 DIAGNOSIS — K922 Gastrointestinal hemorrhage, unspecified: Secondary | ICD-10-CM | POA: Diagnosis not present

## 2021-02-10 DIAGNOSIS — K5792 Diverticulitis of intestine, part unspecified, without perforation or abscess without bleeding: Secondary | ICD-10-CM | POA: Diagnosis not present

## 2021-02-15 ENCOUNTER — Telehealth: Payer: Self-pay | Admitting: *Deleted

## 2021-02-15 NOTE — Telephone Encounter (Signed)
CALLED PATIENT TO INFORM OF CT FOR 02-18-21 - ARRIVAL TIME- 3:45 PM @ [facility] (LABS TO BE DRAWN (I-STAT IN RADIOLOGY), PATIENT TO BE NPO- 4 HRS. PRIOR TO TEST, PATIENT TO RECEIVE RESULTS FROM ALISON PERKINS ON 02-22-21 VIA TELEPHONE

## 2021-02-16 ENCOUNTER — Ambulatory Visit (HOSPITAL_COMMUNITY): Payer: Medicare Other

## 2021-02-16 ENCOUNTER — Inpatient Hospital Stay (HOSPITAL_COMMUNITY)
Admission: AD | Admit: 2021-02-16 | Discharge: 2021-02-18 | DRG: 378 | Disposition: A | Discharge status: Home / Self Care | Payer: Medicare Other | Source: Other Acute Inpatient Hospital | Attending: Internal Medicine | Admitting: Internal Medicine

## 2021-02-16 DIAGNOSIS — D62 Acute posthemorrhagic anemia: Secondary | ICD-10-CM | POA: Diagnosis present

## 2021-02-16 DIAGNOSIS — J9611 Chronic respiratory failure with hypoxia: Secondary | ICD-10-CM | POA: Diagnosis present

## 2021-02-16 DIAGNOSIS — K449 Diaphragmatic hernia without obstruction or gangrene: Secondary | ICD-10-CM | POA: Diagnosis present

## 2021-02-16 DIAGNOSIS — F0390 Unspecified dementia without behavioral disturbance: Secondary | ICD-10-CM | POA: Diagnosis present

## 2021-02-16 DIAGNOSIS — L89312 Pressure ulcer of right buttock, stage 2: Secondary | ICD-10-CM | POA: Diagnosis present

## 2021-02-16 DIAGNOSIS — I10 Essential (primary) hypertension: Secondary | ICD-10-CM | POA: Diagnosis present

## 2021-02-16 DIAGNOSIS — K254 Chronic or unspecified gastric ulcer with hemorrhage: Secondary | ICD-10-CM | POA: Diagnosis present

## 2021-02-16 DIAGNOSIS — Z85118 Personal history of other malignant neoplasm of bronchus and lung: Secondary | ICD-10-CM

## 2021-02-16 DIAGNOSIS — Z923 Personal history of irradiation: Secondary | ICD-10-CM

## 2021-02-16 DIAGNOSIS — I959 Hypotension, unspecified: Secondary | ICD-10-CM | POA: Diagnosis present

## 2021-02-16 DIAGNOSIS — K921 Melena: Secondary | ICD-10-CM

## 2021-02-16 DIAGNOSIS — K259 Gastric ulcer, unspecified as acute or chronic, without hemorrhage or perforation: Secondary | ICD-10-CM | POA: Diagnosis not present

## 2021-02-16 DIAGNOSIS — Z79890 Hormone replacement therapy (postmenopausal): Secondary | ICD-10-CM

## 2021-02-16 DIAGNOSIS — Z972 Presence of dental prosthetic device (complete) (partial): Secondary | ICD-10-CM | POA: Diagnosis not present

## 2021-02-16 DIAGNOSIS — K922 Gastrointestinal hemorrhage, unspecified: Secondary | ICD-10-CM | POA: Diagnosis present

## 2021-02-16 DIAGNOSIS — E785 Hyperlipidemia, unspecified: Secondary | ICD-10-CM | POA: Diagnosis not present

## 2021-02-16 DIAGNOSIS — K3189 Other diseases of stomach and duodenum: Secondary | ICD-10-CM | POA: Diagnosis not present

## 2021-02-16 DIAGNOSIS — L8990 Pressure ulcer of unspecified site, unspecified stage: Secondary | ICD-10-CM | POA: Insufficient documentation

## 2021-02-16 DIAGNOSIS — F419 Anxiety disorder, unspecified: Secondary | ICD-10-CM | POA: Diagnosis present

## 2021-02-16 DIAGNOSIS — Z87891 Personal history of nicotine dependence: Secondary | ICD-10-CM

## 2021-02-16 DIAGNOSIS — K5792 Diverticulitis of intestine, part unspecified, without perforation or abscess without bleeding: Secondary | ICD-10-CM | POA: Diagnosis present

## 2021-02-16 DIAGNOSIS — E782 Mixed hyperlipidemia: Secondary | ICD-10-CM | POA: Diagnosis present

## 2021-02-16 DIAGNOSIS — Z8701 Personal history of pneumonia (recurrent): Secondary | ICD-10-CM

## 2021-02-16 DIAGNOSIS — K2980 Duodenitis without bleeding: Secondary | ICD-10-CM | POA: Diagnosis present

## 2021-02-16 DIAGNOSIS — Z9981 Dependence on supplemental oxygen: Secondary | ICD-10-CM

## 2021-02-16 DIAGNOSIS — G4700 Insomnia, unspecified: Secondary | ICD-10-CM | POA: Diagnosis not present

## 2021-02-16 DIAGNOSIS — J439 Emphysema, unspecified: Secondary | ICD-10-CM | POA: Diagnosis present

## 2021-02-16 DIAGNOSIS — G4730 Sleep apnea, unspecified: Secondary | ICD-10-CM | POA: Diagnosis present

## 2021-02-16 DIAGNOSIS — Z85820 Personal history of malignant melanoma of skin: Secondary | ICD-10-CM

## 2021-02-16 DIAGNOSIS — Z7952 Long term (current) use of systemic steroids: Secondary | ICD-10-CM

## 2021-02-16 DIAGNOSIS — F325 Major depressive disorder, single episode, in full remission: Secondary | ICD-10-CM | POA: Diagnosis not present

## 2021-02-16 DIAGNOSIS — N183 Chronic kidney disease, stage 3 unspecified: Secondary | ICD-10-CM | POA: Diagnosis not present

## 2021-02-16 DIAGNOSIS — K2970 Gastritis, unspecified, without bleeding: Secondary | ICD-10-CM | POA: Diagnosis not present

## 2021-02-16 DIAGNOSIS — Z7982 Long term (current) use of aspirin: Secondary | ICD-10-CM

## 2021-02-16 DIAGNOSIS — J449 Chronic obstructive pulmonary disease, unspecified: Secondary | ICD-10-CM | POA: Diagnosis not present

## 2021-02-16 DIAGNOSIS — Z90710 Acquired absence of both cervix and uterus: Secondary | ICD-10-CM

## 2021-02-16 DIAGNOSIS — C3490 Malignant neoplasm of unspecified part of unspecified bronchus or lung: Secondary | ICD-10-CM | POA: Diagnosis not present

## 2021-02-16 DIAGNOSIS — Z79899 Other long term (current) drug therapy: Secondary | ICD-10-CM

## 2021-02-16 DIAGNOSIS — E890 Postprocedural hypothyroidism: Secondary | ICD-10-CM | POA: Diagnosis present

## 2021-02-16 DIAGNOSIS — K2100 Gastro-esophageal reflux disease with esophagitis, without bleeding: Secondary | ICD-10-CM | POA: Diagnosis present

## 2021-02-16 DIAGNOSIS — Z8249 Family history of ischemic heart disease and other diseases of the circulatory system: Secondary | ICD-10-CM

## 2021-02-16 DIAGNOSIS — R933 Abnormal findings on diagnostic imaging of other parts of digestive tract: Secondary | ICD-10-CM | POA: Diagnosis not present

## 2021-02-16 DIAGNOSIS — I5033 Acute on chronic diastolic (congestive) heart failure: Secondary | ICD-10-CM | POA: Diagnosis not present

## 2021-02-16 DIAGNOSIS — Z825 Family history of asthma and other chronic lower respiratory diseases: Secondary | ICD-10-CM

## 2021-02-16 LAB — COMPREHENSIVE METABOLIC PANEL
ALT: 17 U/L (ref 0–44)
AST: 28 U/L (ref 15–41)
Albumin: 1.8 g/dL — ABNORMAL LOW (ref 3.5–5.0)
Alkaline Phosphatase: 45 U/L (ref 38–126)
Anion gap: 8 (ref 5–15)
BUN: 29 mg/dL — ABNORMAL HIGH (ref 8–23)
CO2: 35 mmol/L — ABNORMAL HIGH (ref 22–32)
Calcium: 8.2 mg/dL — ABNORMAL LOW (ref 8.9–10.3)
Chloride: 101 mmol/L (ref 98–111)
Creatinine, Ser: 1.26 mg/dL — ABNORMAL HIGH (ref 0.44–1.00)
GFR, Estimated: 44 mL/min — ABNORMAL LOW (ref >60)
Glucose, Bld: 138 mg/dL — ABNORMAL HIGH (ref 70–99)
Potassium: 3.1 mmol/L — ABNORMAL LOW (ref 3.5–5.1)
Sodium: 144 mmol/L (ref 135–145)
Total Bilirubin: 0.6 mg/dL (ref 0.3–1.2)
Total Protein: 3.9 g/dL — ABNORMAL LOW (ref 6.5–8.1)

## 2021-02-16 LAB — CBC WITH DIFFERENTIAL/PLATELET
Abs Immature Granulocytes: 0.05 10*3/uL (ref 0.00–0.07)
Basophils Absolute: 0 10*3/uL (ref 0.0–0.1)
Basophils Relative: 0 %
Eosinophils Absolute: 0.1 10*3/uL (ref 0.0–0.5)
Eosinophils Relative: 1 %
HCT: 17.1 % — ABNORMAL LOW (ref 36.0–46.0)
Hemoglobin: 5.2 g/dL — CL (ref 12.0–15.0)
Immature Granulocytes: 1 %
Lymphocytes Relative: 19 %
Lymphs Abs: 1.6 10*3/uL (ref 0.7–4.0)
MCH: 30.8 pg (ref 26.0–34.0)
MCHC: 30.4 g/dL (ref 30.0–36.0)
MCV: 101.2 fL — ABNORMAL HIGH (ref 80.0–100.0)
Monocytes Absolute: 0.7 10*3/uL (ref 0.1–1.0)
Monocytes Relative: 8 %
Neutro Abs: 5.9 10*3/uL (ref 1.7–7.7)
Neutrophils Relative %: 71 %
Platelets: 148 10*3/uL — ABNORMAL LOW (ref 150–400)
RBC: 1.69 MIL/uL — ABNORMAL LOW (ref 3.87–5.11)
RDW: 19.2 % — ABNORMAL HIGH (ref 11.5–15.5)
WBC: 8.4 10*3/uL (ref 4.0–10.5)
nRBC: 0.4 % — ABNORMAL HIGH (ref 0.0–0.2)

## 2021-02-16 LAB — PROTIME-INR
INR: 1.2 (ref 0.8–1.2)
Prothrombin Time: 15.4 seconds — ABNORMAL HIGH (ref 11.4–15.2)

## 2021-02-16 LAB — PREPARE RBC (CROSSMATCH)

## 2021-02-16 LAB — MRSA PCR SCREENING: MRSA by PCR: NEGATIVE

## 2021-02-16 LAB — ABO/RH: ABO/RH(D): O POS

## 2021-02-16 MED ORDER — UMECLIDINIUM BROMIDE 62.5 MCG/INH IN AEPB
1.0000 | INHALATION_SPRAY | Freq: Every day | RESPIRATORY_TRACT | Status: DC
  Administered 2021-02-18: 1 via RESPIRATORY_TRACT
  Filled 2021-02-16: qty 7

## 2021-02-16 MED ORDER — ALPRAZOLAM 0.5 MG PO TABS
0.5000 mg | ORAL_TABLET | Freq: Two times a day (BID) | ORAL | Status: DC | PRN
  Administered 2021-02-16 – 2021-02-18 (×3): 0.5 mg via ORAL
  Filled 2021-02-16 (×3): qty 1

## 2021-02-16 MED ORDER — POTASSIUM CHLORIDE CRYS ER 20 MEQ PO TBCR
40.0000 meq | EXTENDED_RELEASE_TABLET | Freq: Two times a day (BID) | ORAL | Status: DC
  Administered 2021-02-16 – 2021-02-17 (×3): 40 meq via ORAL
  Filled 2021-02-16 (×3): qty 2

## 2021-02-16 MED ORDER — TRAMADOL HCL 50 MG PO TABS
50.0000 mg | ORAL_TABLET | Freq: Four times a day (QID) | ORAL | Status: DC | PRN
  Administered 2021-02-17: 50 mg via ORAL
  Filled 2021-02-16: qty 1

## 2021-02-16 MED ORDER — ACETAMINOPHEN 650 MG RE SUPP: 650.0000 mg | Freq: Four times a day (QID) | RECTAL | Status: DC | PRN

## 2021-02-16 MED ORDER — SODIUM CHLORIDE 0.9% IV SOLUTION
Freq: Once | INTRAVENOUS | Status: AC
Start: 2021-02-16 — End: 2021-02-16

## 2021-02-16 MED ORDER — FLUTICASONE FUROATE-VILANTEROL 100-25 MCG/INH IN AEPB
1.0000 | INHALATION_SPRAY | Freq: Every day | RESPIRATORY_TRACT | Status: DC
  Administered 2021-02-18: 1 via RESPIRATORY_TRACT
  Filled 2021-02-16: qty 28

## 2021-02-16 MED ORDER — FLUTICASONE-UMECLIDIN-VILANT 100-62.5-25 MCG/INH IN AEPB: 1.0000 | INHALATION_SPRAY | Freq: Every day | RESPIRATORY_TRACT | Status: DC

## 2021-02-16 MED ORDER — PIPERACILLIN-TAZOBACTAM 3.375 G IVPB
3.3750 g | Freq: Three times a day (TID) | INTRAVENOUS | Status: DC
  Administered 2021-02-16 – 2021-02-18 (×8): 3.375 g via INTRAVENOUS
  Filled 2021-02-16 (×9): qty 50

## 2021-02-16 MED ORDER — LEVOTHYROXINE SODIUM 25 MCG PO TABS
137.0000 ug | ORAL_TABLET | Freq: Every day | ORAL | Status: DC
  Administered 2021-02-16 – 2021-02-18 (×3): 137 ug via ORAL
  Filled 2021-02-16 (×3): qty 1

## 2021-02-16 MED ORDER — IPRATROPIUM-ALBUTEROL 0.5-2.5 (3) MG/3ML IN SOLN: 3.0000 mL | RESPIRATORY_TRACT | Status: DC | PRN

## 2021-02-16 MED ORDER — BUSPIRONE HCL 10 MG PO TABS
30.0000 mg | ORAL_TABLET | Freq: Two times a day (BID) | ORAL | Status: DC
  Administered 2021-02-16 – 2021-02-18 (×5): 30 mg via ORAL
  Filled 2021-02-16 (×4): qty 3
  Filled 2021-02-16: qty 6

## 2021-02-16 MED ORDER — PREDNISONE 20 MG PO TABS
20.0000 mg | ORAL_TABLET | Freq: Every day | ORAL | Status: DC
  Administered 2021-02-16 – 2021-02-18 (×2): 20 mg via ORAL
  Filled 2021-02-16 (×3): qty 1

## 2021-02-16 MED ORDER — DONEPEZIL HCL 5 MG PO TABS
10.0000 mg | ORAL_TABLET | Freq: Every day | ORAL | Status: DC
  Administered 2021-02-16 – 2021-02-17 (×2): 10 mg via ORAL
  Filled 2021-02-16 (×2): qty 2

## 2021-02-16 MED ORDER — SODIUM CHLORIDE 0.9% IV SOLUTION: Freq: Once | INTRAVENOUS | Status: DC

## 2021-02-16 MED ORDER — ONDANSETRON HCL 4 MG PO TABS: 4.0000 mg | ORAL_TABLET | Freq: Four times a day (QID) | ORAL | Status: DC | PRN

## 2021-02-16 MED ORDER — ONDANSETRON HCL 4 MG/2ML IJ SOLN: 4.0000 mg | Freq: Four times a day (QID) | INTRAMUSCULAR | Status: DC | PRN

## 2021-02-16 MED ORDER — ZINC OXIDE 40 % EX OINT
TOPICAL_OINTMENT | Freq: Two times a day (BID) | CUTANEOUS | Status: DC
  Filled 2021-02-16: qty 57

## 2021-02-16 MED ORDER — PANTOPRAZOLE SODIUM 40 MG IV SOLR: 40.0000 mg | Freq: Two times a day (BID) | INTRAVENOUS | Status: DC

## 2021-02-16 MED ORDER — SODIUM CHLORIDE 0.9 % IV SOLN: INTRAVENOUS | Status: DC

## 2021-02-16 MED ORDER — SODIUM CHLORIDE 0.9 % IV SOLN
8.0000 mg/h | INTRAVENOUS | Status: DC
  Administered 2021-02-16 – 2021-02-17 (×3): 8 mg/h via INTRAVENOUS
  Filled 2021-02-16 (×4): qty 80

## 2021-02-16 MED ORDER — ACETAMINOPHEN 325 MG PO TABS: 650.0000 mg | ORAL_TABLET | Freq: Four times a day (QID) | ORAL | Status: DC | PRN

## 2021-02-16 MED ORDER — BUPROPION HCL ER (XL) 300 MG PO TB24
300.0000 mg | ORAL_TABLET | Freq: Every day | ORAL | Status: DC
  Administered 2021-02-16 – 2021-02-18 (×3): 300 mg via ORAL
  Filled 2021-02-16 (×3): qty 1

## 2021-02-16 MED ORDER — ALBUTEROL SULFATE HFA 108 (90 BASE) MCG/ACT IN AERS
2.0000 | INHALATION_SPRAY | RESPIRATORY_TRACT | Status: DC | PRN
  Administered 2021-02-18: 2 via RESPIRATORY_TRACT
  Filled 2021-02-16: qty 6.7

## 2021-02-16 NOTE — Progress Notes (Signed)
fexiseal insertion verbal order received.  Lavenia Atlas, RN

## 2021-02-16 NOTE — Progress Notes (Signed)
Report received from [place], [place]. Patient arrived to unit transported from [place] via [place]. Alert and oriented x4. Skin assessed with [place], RN. Stage 2 to buttocks. Small skin tear to left [place]. Small bm noted dark tarry colored. Placed on 4L O2 nasal cannula. MD Admission paged. MD Alcario Drought paged by admissions. Will continue to monitor.

## 2021-02-16 NOTE — H&P (View-Only) (Signed)
Referring Provider:  [place] Primary Care Physician:  Harlan Stains, MD Primary Gastroenterologist:  Dr. Penelope Coop  Reason for Consultation: Diverticulitis, upper GI bleed  HPI: Holly [name] is a 75 y.o. 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  Patient was getting treatment with IV Zosyn.  She subsequently developed melanotic stool and drop in hemoglobin and was transferred to core hospital for further management.  GI is consulted for further management.  Hemoglobin dropped to 5.2 today.  Mild elevated creatinine.  LFTs are normal.  Normal INR.  Platelet counts of 148.  Patient seen and examined at bedside.  Chart from outside hospital reviewed.  Discussed with daughter at bedside.  Patient had 1 episode of maroon-colored blood in the stool prior to hospitalization at [facility].  Subsequently she started having black tarry stools.  Last episode of black tarry stool was this morning.  She is complaining of epigastric abdominal pain.  Denied any vomiting.  Complaining of weakness and fatigue.  She is on chronic prednisone for her COPD and takes baby aspirin.    EGD in September 2013 showed mild gastritis.  Biopsies were negative for H. Pylori. Colonoscopy in September 2013 showed small tubular adenoma in the ascending colon, hyperplastic polyp in the rectum and diverticulosis.  Repeat was recommended in 5 years.  Past Medical History:  Diagnosis Date  . Anxiety   . Asthma   . Basal cell carcinoma    lung cancer.  Skin cancer- basal - nose removed  . Bruises easily   . Cataract   . Cervicalgia   . 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 Dyspnea    uses O2 only when needed. Uses 2 L   . Foot swelling    left- has shown to Drs.  . Frequency of urination   .  Headache   . 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 Hypercalcemia   . Hypertension   . Hypothyroidism   . Mixed hyperlipidemia   . Pneumonia   . Pulmonary nodule   . Sleep apnea    does not wear cpap  . Thyroid neoplasm   . Urinary incontinence   . Wears dentures   . Wears glasses     Past Surgical History:  Procedure Laterality Date  . COLONOSCOPY    . DENTAL SURGERY    . ELBOW ARTHROPLASTY Right 2020  . EYE SURGERY Right    cataract surgery  . FOOT SURGERY  30 YRS AGO   BILATERAL  . HARDWARE REMOVAL Right 10/02/2020   Procedure: HARDWARE REMOVAL RIGHT ELBOW;  Surgeon: Shona Needles, MD;  Location: [place];  Service: Orthopedics;  Laterality: Right;  . I & D EXTREMITY Right 01/13/2021   Procedure: IRRIGATION AND DEBRIDEMENT RIGHT ELBOW;  Surgeon: Shona Needles, MD;  Location: [place];  Service: Orthopedics;  Laterality: Right;  . ORIF ELBOW FRACTURE Right 12/06/2017   Procedure: OPEN REDUCTION INTERNAL FIXATION (ORIF) ELBOW/OLECRANON FRACTURE;  Surgeon: Shona Needles, MD;  Location: [place];  Service: Orthopedics;  Laterality: Right;  . SKIN CANCER EXCISION    . THYROIDECTOMY N/A 07/31/2014   Procedure: TOTAL THYROIDECTOMY;  Surgeon: Armandina Gemma, MD;  Location: [facility];  Service: General;  Laterality: N/A;  . VAGINAL HYSTERECTOMY    .  VIDEO BRONCHOSCOPY WITH ENDOBRONCHIAL NAVIGATION Bilateral 06/25/2019   Procedure: VIDEO BRONCHOSCOPY WITH ENDOBRONCHIAL NAVIGATION;  Surgeon: Lajuana Matte, MD;  [facility];  Service: Thoracic;  Laterality: Bilateral;    Prior to Admission medications   Medication Sig Start Date End Date Taking? Authorizing Provider  ALPRAZolam Duanne Moron) 0.5 MG tablet Take 0.5 mg by mouth 2 (two) times daily as needed for anxiety. 09/14/20   [provider]  aspirin EC 81 MG tablet Take 81 mg by mouth daily. Swallow whole.    [provider]  atorvastatin (LIPITOR) 20 MG tablet Take 20 mg by mouth at bedtime.     [provider]  buPROPion (WELLBUTRIN XL) 300 MG 24 hr tablet Take 300 mg by mouth daily.    [provider]  busPIRone (BUSPAR) 30 MG tablet Take 30 mg by mouth 2 (two) times daily.    [provider]  cholecalciferol (VITAMIN D) 25 MCG tablet Take 2 tablets (2,000 Units total) by mouth daily. Patient not taking: Reported on 01/06/2021 10/05/20   Delray Alt, PA-C  Dextromethorphan-guaiFENesin [facility] DM MAXIMUM STRENGTH PO) Take 15 mLs by mouth every 4 (four) hours as needed (cough/cold symptoms).    [provider]  donepezil (ARICEPT) 10 MG tablet Take 10 mg by mouth at bedtime. 12/17/20   [provider]  Fluticasone-Umeclidin-Vilant 100-62.5-25 MCG/INH AEPB Inhale 1 puff into the lungs daily. Trelegy    [provider]  furosemide (LASIX) 20 MG tablet Take 20 mg by mouth daily. 12/18/20   [provider]  ipratropium-albuterol (DUONEB) 0.5-2.5 (3) MG/3ML SOLN Inhale 3 mLs into the lungs every 4 (four) hours as needed (respiratory issues.). 01/29/20   [provider]  levothyroxine (SYNTHROID) 137 MCG tablet Take 137 mcg by mouth daily before breakfast. 09/22/20   [provider]  metoprolol succinate (TOPROL-XL) 50 MG 24 hr tablet Take 50 mg by mouth daily. Take with or immediately following a meal.    [provider]  predniSONE (DELTASONE) 10 MG tablet Take 20 mg by mouth daily with breakfast.    [provider]  traMADol (ULTRAM) 50 MG tablet Take 1 tablet (50 mg total) by mouth every 6 (six) hours as needed for severe pain. 01/13/21   Delray Alt, PA-C  valsartan-hydrochlorothiazide (DIOVAN-HCT) 160-12.5 MG tablet Take 1 tablet by mouth daily. 06/29/20   [provider]  VENTOLIN HFA 108 (90 Base) MCG/ACT inhaler INHALE 2 PUFFS INTO THE LUNGS EVERY 4 HOURS AS NEEDED FOR WHEEZING ORSHORTNESS OF BREATH Patient taking differently: Inhale 2 puffs into the lungs every 4 (four) hours as needed for  wheezing or shortness of breath. 06/29/17   Parrett, Fonnie Mu, NP    Scheduled Meds: . sodium chloride   Intravenous Once  . fluticasone furoate-vilanterol  1 puff Inhalation Daily  . [START ON 02/19/2021] pantoprazole  40 mg Intravenous Q12H  . predniSONE  20 mg Oral Q breakfast  . umeclidinium bromide  1 puff Inhalation Daily   Continuous Infusions: . pantoprozole (PROTONIX) infusion 8 mg/hr (02/16/21 0436)  . piperacillin-tazobactam (ZOSYN)  IV 3.375 g (02/16/21 0710)   PRN Meds:.acetaminophen **OR** acetaminophen, albuterol, ondansetron **OR** ondansetron (ZOFRAN) IV  Allergies as of 02/15/2021 - Review Complete 01/13/2021  Allergen Reaction Noted  . Amlodipine besylate Other (See Comments) 11/17/2020  . Codeine Nausea And Vomiting   . Oxycodone-acetaminophen Nausea And Vomiting 12/06/2017    Family History  Problem Relation Age of Onset  . Coronary artery disease Father   .  Hypertension Father   . Asthma Father   . Allergies Father   . Hypertension Mother   . Lung cancer Mother     Social History   Socioeconomic History  . Marital status: Widowed    Spouse name: Not on file  . Number of children: 2  . Years of education: Not on file  . Highest education level: Not on file  Occupational History  . Occupation: [employer]  Tobacco Use  . Smoking status: Former Smoker    Packs/day: 1.00    Years: 30.00    Pack years: 30.00    Types: Cigarettes    Quit date: 02/24/2015    Years since quitting: 5.9  . Smokeless tobacco: Never Used  Vaping Use  . Vaping Use: Former  Substance and Sexual Activity  . Alcohol use: Yes    Alcohol/week: 0.0 standard drinks    Comment: very seldom  . Drug use: Never  . Sexual activity: Not on file  Other Topics Concern  . Not on file  Social History Narrative  . Not on file   Social Determinants of Health   Financial Resource Strain: Not on file  Food Insecurity: Not on file  Transportation Needs: Not on file  Physical  Activity: Not on file  Stress: Not on file  Social Connections: Not on file  Intimate Partner Violence: Not on file    Review of Systems: 12 point review of system is done which is negative except as mentioned in HPI  Physical Exam: Vital signs: Vitals:   02/16/21 0251 02/16/21 0358  BP: (!) 93/49 (!) 91/53  Pulse: 95 98  Resp:  19  Temp:  (!) 97.5 F (36.4 C)  SpO2:  100%   Last BM Date: 02/16/21 Physical Exam Vitals reviewed.  Constitutional:      Appearance: Normal appearance. She is not ill-appearing.  HENT:     Head: Normocephalic and atraumatic.     Nose: Nose normal.     Mouth/Throat:     Mouth: Mucous membranes are moist.  Eyes:     Extraocular Movements: Extraocular movements intact.  Cardiovascular:     Rate and Rhythm: Normal rate.     Heart sounds: Normal heart sounds.  Pulmonary:     Effort: Pulmonary effort is normal. No respiratory distress.  Abdominal:     Comments: Epigastric discomfort on palpation, abdomen is soft, nontender, nondistended, bowel sounds present.  No peritoneal signs.  Musculoskeletal:        General: No swelling or tenderness.     Cervical back: Normal range of motion.     Right lower leg: No edema.     Left lower leg: No edema.  Skin:    General: Skin is warm.     Coloration: Skin is not jaundiced.  Neurological:     Mental Status: She is alert and oriented to person, place, and time.  Psychiatric:        Mood and Affect: Mood normal.        Thought Content: Thought content normal.        Judgment: Judgment normal.     GI:  Lab Results: Recent Labs    02/16/21 0245  WBC 8.4  HGB 5.2*  HCT 17.1*  PLT 148*   BMET Recent Labs    02/16/21 0245  NA 144  K 3.1*  CL 101  CO2 35*  GLUCOSE 138*  BUN 29*  CREATININE 1.26*  CALCIUM 8.2*   LFT Recent Labs  02/16/21 0245  PROT 3.9*  ALBUMIN 1.8*  AST 28  ALT 17  ALKPHOS 45  BILITOT 0.6   PT/INR Recent Labs    02/16/21 0245  LABPROT 15.4*  INR 1.2      Studies/Results: No results found.  Impression/Plan: -Melena in setting of prednisone and aspirin use.  Most likely ulcer disease. -Bright red blood per rectum, one episode prior to her hospitalization last week. -Acute blood loss anemia.  IMA globin was 8.5 at outside hospital.  Dropped to 5.2. -Diverticulitis based on outside CT scan report showing diverticulosis and pericolonic fluid collection around sigmoid colon. -History of lung cancer.  Status postradiation -COPD on 3 L of oxygen and prednisone.  Recommendations ----------------------- -Transfuse 2 units of PRBC -Continue Protonix drip -Plan for EGD tomorrow morning -Continue supportive care.  Okay to have clear liquid diet today. -If EGD negative, consider colonoscopy or bleeding scan given history of biting blood per rectum prior to her hospitalization  Risks (bleeding, infection, bowel perforation that could require surgery, sedation-related changes in cardiopulmonary systems), benefits (identification and possible treatment of source of symptoms, exclusion of certain causes of symptoms), and alternatives (watchful waiting, radiographic imaging studies, empiric medical treatment)  were explained to patient/family in detail and patient wishes to proceed.  Outside hospital records reviewed.  [facility] from [facility] reviewed.  Management plan discussed with patient's family as well as hospitalist.   LOS: 0 days   Otis Brace  MD, FACP 02/16/2021, 8:07 AM  Contact #  [phone]

## 2021-02-16 NOTE — Progress Notes (Signed)
HGB 5.2.  3u PRBC transfusion ordered.

## 2021-02-16 NOTE — Progress Notes (Signed)
Pharmacy Antibiotic Note  Holly [name] is a 75 y.o. female admitted on 02/16/2021 with diverticulosis/diverticulitis and GIB.  Pharmacy has been consulted for Zosyn dosing.  Of note pt was transferred from another facility, had AKI w/ SCr up to 1.7 per records sent w/ pt, now mostly resolved w/ SCr at 1.1, baseline ~1.  Was getting Zosyn 4.5g IV Q8H, last dose 5/2 am.  Labs from Holly [name]: WBC 7.8, Hgb 7.0, Plt 157, Na 141, K 2.8, SCr 1.1, Ca 8.1  Plan: Zosyn 3.375g IV q8h (4-hour infusion).  Height: 4\' 11"  (149.9 cm) Weight: 77.1 kg (170 lb) IBW/kg (Calculated) : 43.2   Allergies  Allergen Reactions  . Amlodipine Besylate Other (See Comments)    Other reaction(s): edema  . Codeine Nausea And Vomiting  . Oxycodone-Acetaminophen Nausea And Vomiting     Thank you for allowing pharmacy to be a part of this patient's care.  [name], PharmD, BCPS  02/16/2021 3:27 AM

## 2021-02-16 NOTE — Progress Notes (Signed)
Date and time results received: 02/16/21 0500 (use smartphrase ".now" to insert current time)  Test: HGB Critical Value: 5.2  Name of Provider Notified: Alcario Drought  Orders Received? Or Actions Taken?: MD paged Patient transferred to [phone]. Report given to Elmyra Ricks, Therapist, sports.

## 2021-02-16 NOTE — Progress Notes (Signed)
Blood consent form obtained and placed in pt's chart.   Lavenia Atlas, RN

## 2021-02-16 NOTE — Consult Note (Signed)
Referring Provider:  [place] Primary Care Physician:  Harlan Stains, MD Primary Gastroenterologist:  Dr. Penelope Coop  Reason for Consultation: Diverticulitis, upper GI bleed  HPI: Holly [name] is a 75 y.o. 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  Patient was getting treatment with IV Zosyn.  She subsequently developed melanotic stool and drop in hemoglobin and was transferred to core hospital for further management.  GI is consulted for further management.  Hemoglobin dropped to 5.2 today.  Mild elevated creatinine.  LFTs are normal.  Normal INR.  Platelet counts of 148.  Patient seen and examined at bedside.  Chart from outside hospital reviewed.  Discussed with daughter at bedside.  Patient had 1 episode of maroon-colored blood in the stool prior to hospitalization at [facility].  Subsequently she started having black tarry stools.  Last episode of black tarry stool was this morning.  She is complaining of epigastric abdominal pain.  Denied any vomiting.  Complaining of weakness and fatigue.  She is on chronic prednisone for her COPD and takes baby aspirin.    EGD in September 2013 showed mild gastritis.  Biopsies were negative for H. Pylori. Colonoscopy in September 2013 showed small tubular adenoma in the ascending colon, hyperplastic polyp in the rectum and diverticulosis.  Repeat was recommended in 5 years.  Past Medical History:  Diagnosis Date  . Anxiety   . Asthma   . Basal cell carcinoma    lung cancer.  Skin cancer- basal - nose removed  . Bruises easily   . Cataract   . Cervicalgia   . 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 Dyspnea    uses O2 only when needed. Uses 2 L   . Foot swelling    left- has shown to Drs.  . Frequency of urination   .  Headache   . 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 Hypercalcemia   . Hypertension   . Hypothyroidism   . Mixed hyperlipidemia   . Pneumonia   . Pulmonary nodule   . Sleep apnea    does not wear cpap  . Thyroid neoplasm   . Urinary incontinence   . Wears dentures   . Wears glasses     Past Surgical History:  Procedure Laterality Date  . COLONOSCOPY    . DENTAL SURGERY    . ELBOW ARTHROPLASTY Right 2020  . EYE SURGERY Right    cataract surgery  . FOOT SURGERY  30 YRS AGO   BILATERAL  . HARDWARE REMOVAL Right 10/02/2020   Procedure: HARDWARE REMOVAL RIGHT ELBOW;  Surgeon: Shona Needles, MD;  Location: [place];  Service: Orthopedics;  Laterality: Right;  . I & D EXTREMITY Right 01/13/2021   Procedure: IRRIGATION AND DEBRIDEMENT RIGHT ELBOW;  Surgeon: Shona Needles, MD;  Location: [place];  Service: Orthopedics;  Laterality: Right;  . ORIF ELBOW FRACTURE Right 12/06/2017   Procedure: OPEN REDUCTION INTERNAL FIXATION (ORIF) ELBOW/OLECRANON FRACTURE;  Surgeon: Shona Needles, MD;  Location: [place];  Service: Orthopedics;  Laterality: Right;  . SKIN CANCER EXCISION    . THYROIDECTOMY N/A 07/31/2014   Procedure: TOTAL THYROIDECTOMY;  Surgeon: Armandina Gemma, MD;  [facility];  Service: General;  Laterality: N/A;  . VAGINAL HYSTERECTOMY    .  VIDEO BRONCHOSCOPY WITH ENDOBRONCHIAL NAVIGATION Bilateral 06/25/2019   Procedure: VIDEO BRONCHOSCOPY WITH ENDOBRONCHIAL NAVIGATION;  Surgeon: Lajuana Matte, MD;  [facility];  Service: Thoracic;  Laterality: Bilateral;    Prior to Admission medications   Medication Sig Start Date End Date Taking? Authorizing Provider  ALPRAZolam Duanne Moron) 0.5 MG tablet Take 0.5 mg by mouth 2 (two) times daily as needed for anxiety. 09/14/20   [provider]  aspirin EC 81 MG tablet Take 81 mg by mouth daily. Swallow whole.    [provider]  atorvastatin (LIPITOR) 20 MG tablet Take 20 mg by mouth at bedtime.     [provider]  buPROPion (WELLBUTRIN XL) 300 MG 24 hr tablet Take 300 mg by mouth daily.    [provider]  busPIRone (BUSPAR) 30 MG tablet Take 30 mg by mouth 2 (two) times daily.    [provider]  cholecalciferol (VITAMIN D) 25 MCG tablet Take 2 tablets (2,000 Units total) by mouth daily. Patient not taking: Reported on 01/06/2021 10/05/20   Delray Alt, PA-C  Dextromethorphan-guaiFENesin [facility] DM MAXIMUM STRENGTH PO) Take 15 mLs by mouth every 4 (four) hours as needed (cough/cold symptoms).    [provider]  donepezil (ARICEPT) 10 MG tablet Take 10 mg by mouth at bedtime. 12/17/20   [provider]  Fluticasone-Umeclidin-Vilant 100-62.5-25 MCG/INH AEPB Inhale 1 puff into the lungs daily. Trelegy    [provider]  furosemide (LASIX) 20 MG tablet Take 20 mg by mouth daily. 12/18/20   [provider]  ipratropium-albuterol (DUONEB) 0.5-2.5 (3) MG/3ML SOLN Inhale 3 mLs into the lungs every 4 (four) hours as needed (respiratory issues.). 01/29/20   [provider]  levothyroxine (SYNTHROID) 137 MCG tablet Take 137 mcg by mouth daily before breakfast. 09/22/20   [provider]  metoprolol succinate (TOPROL-XL) 50 MG 24 hr tablet Take 50 mg by mouth daily. Take with or immediately following a meal.    [provider]  predniSONE (DELTASONE) 10 MG tablet Take 20 mg by mouth daily with breakfast.    [provider]  traMADol (ULTRAM) 50 MG tablet Take 1 tablet (50 mg total) by mouth every 6 (six) hours as needed for severe pain. 01/13/21   Delray Alt, PA-C  valsartan-hydrochlorothiazide (DIOVAN-HCT) 160-12.5 MG tablet Take 1 tablet by mouth daily. 06/29/20   [provider]  VENTOLIN HFA 108 (90 Base) MCG/ACT inhaler INHALE 2 PUFFS INTO THE LUNGS EVERY 4 HOURS AS NEEDED FOR WHEEZING ORSHORTNESS OF BREATH Patient taking differently: Inhale 2 puffs into the lungs every 4 (four) hours as needed for  wheezing or shortness of breath. 06/29/17   Parrett, Fonnie Mu, NP    Scheduled Meds: . sodium chloride   Intravenous Once  . fluticasone furoate-vilanterol  1 puff Inhalation Daily  . [START ON 02/19/2021] pantoprazole  40 mg Intravenous Q12H  . predniSONE  20 mg Oral Q breakfast  . umeclidinium bromide  1 puff Inhalation Daily   Continuous Infusions: . pantoprozole (PROTONIX) infusion 8 mg/hr (02/16/21 0436)  . piperacillin-tazobactam (ZOSYN)  IV 3.375 g (02/16/21 0710)   PRN Meds:.acetaminophen **OR** acetaminophen, albuterol, ondansetron **OR** ondansetron (ZOFRAN) IV  Allergies as of 02/15/2021 - Review Complete 01/13/2021  Allergen Reaction Noted  . Amlodipine besylate Other (See Comments) 11/17/2020  . Codeine Nausea And Vomiting   . Oxycodone-acetaminophen Nausea And Vomiting 12/06/2017    Family History  Problem Relation Age of Onset  . Coronary artery disease Father   .  Hypertension Father   . Asthma Father   . Allergies Father   . Hypertension Mother   . Lung cancer Mother     Social History   Socioeconomic History  . Marital status: Widowed    Spouse name: Not on file  . Number of children: 2  . Years of education: Not on file  . Highest education level: Not on file  Occupational History  . Occupation: [employer]  Tobacco Use  . Smoking status: Former Smoker    Packs/day: 1.00    Years: 30.00    Pack years: 30.00    Types: Cigarettes    Quit date: 02/24/2015    Years since quitting: 5.9  . Smokeless tobacco: Never Used  Vaping Use  . Vaping Use: Former  Substance and Sexual Activity  . Alcohol use: Yes    Alcohol/week: 0.0 standard drinks    Comment: very seldom  . Drug use: Never  . Sexual activity: Not on file  Other Topics Concern  . Not on file  Social History Narrative  . Not on file   Social Determinants of Health   Financial Resource Strain: Not on file  Food Insecurity: Not on file  Transportation Needs: Not on file  Physical  Activity: Not on file  Stress: Not on file  Social Connections: Not on file  Intimate Partner Violence: Not on file    Review of Systems: 12 point review of system is done which is negative except as mentioned in HPI  Physical Exam: Vital signs: Vitals:   02/16/21 0251 02/16/21 0358  BP: (!) 93/49 (!) 91/53  Pulse: 95 98  Resp:  19  Temp:  (!) 97.5 F (36.4 C)  SpO2:  100%   Last BM Date: 02/16/21 Physical Exam Vitals reviewed.  Constitutional:      Appearance: Normal appearance. She is not ill-appearing.  HENT:     Head: Normocephalic and atraumatic.     Nose: Nose normal.     Mouth/Throat:     Mouth: Mucous membranes are moist.  Eyes:     Extraocular Movements: Extraocular movements intact.  Cardiovascular:     Rate and Rhythm: Normal rate.     Heart sounds: Normal heart sounds.  Pulmonary:     Effort: Pulmonary effort is normal. No respiratory distress.  Abdominal:     Comments: Epigastric discomfort on palpation, abdomen is soft, nontender, nondistended, bowel sounds present.  No peritoneal signs.  Musculoskeletal:        General: No swelling or tenderness.     Cervical back: Normal range of motion.     Right lower leg: No edema.     Left lower leg: No edema.  Skin:    General: Skin is warm.     Coloration: Skin is not jaundiced.  Neurological:     Mental Status: She is alert and oriented to person, place, and time.  Psychiatric:        Mood and Affect: Mood normal.        Thought Content: Thought content normal.        Judgment: Judgment normal.     GI:  Lab Results: Recent Labs    02/16/21 0245  WBC 8.4  HGB 5.2*  HCT 17.1*  PLT 148*   BMET Recent Labs    02/16/21 0245  NA 144  K 3.1*  CL 101  CO2 35*  GLUCOSE 138*  BUN 29*  CREATININE 1.26*  CALCIUM 8.2*   LFT Recent Labs  02/16/21 0245  PROT 3.9*  ALBUMIN 1.8*  AST 28  ALT 17  ALKPHOS 45  BILITOT 0.6   PT/INR Recent Labs    02/16/21 0245  LABPROT 15.4*  INR 1.2      Studies/Results: No results found.  Impression/Plan: -Melena in setting of prednisone and aspirin use.  Most likely ulcer disease. -Bright red blood per rectum, one episode prior to her hospitalization last week. -Acute blood loss anemia.  IMA globin was 8.5 at outside hospital.  Dropped to 5.2. -Diverticulitis based on outside CT scan report showing diverticulosis and pericolonic fluid collection around sigmoid colon. -History of lung cancer.  Status postradiation -COPD on 3 L of oxygen and prednisone.  Recommendations ----------------------- -Transfuse 2 units of PRBC -Continue Protonix drip -Plan for EGD tomorrow morning -Continue supportive care.  Okay to have clear liquid diet today. -If EGD negative, consider colonoscopy or bleeding scan given history of biting blood per rectum prior to her hospitalization  Risks (bleeding, infection, bowel perforation that could require surgery, sedation-related changes in cardiopulmonary systems), benefits (identification and possible treatment of source of symptoms, exclusion of certain causes of symptoms), and alternatives (watchful waiting, radiographic imaging studies, empiric medical treatment)  were explained to patient/family in detail and patient wishes to proceed.  Outside hospital records reviewed.  Chart from [facility] reviewed.  Management plan discussed with patient's family as well as hospitalist.   LOS: 0 days   Otis Brace  MD, FACP 02/16/2021, 8:07 AM  Contact #  [phone]

## 2021-02-16 NOTE — Progress Notes (Signed)
Obtained consent form and placed in pt's chart.   Hutton Pellicane S Maureena Dabbs, RN  

## 2021-02-16 NOTE — Progress Notes (Signed)
Patient seen and examined.  Her daughter was at the bedside.  Admitted early morning hours by nighttime hospitalist for following condition.  Patient tells me that she feels weak and anxious.  75 year old female with history of lung cancer status post radiation treatment, COPD and chronic hypoxemic respiratory failure on 3 L of home oxygen, chronic prednisone 20 mg daily and hypertension presented to [facility] with about 1 week of abdominal pain.  A CT scan showed diverticulitis without complication and she was getting antibiotics.  After admission at [facility], patient was started having melanotic stool, abdominal discomfort.  She dropped her hemoglobin to 6.  She had received 2 units of transfusion and sent to [facility] because they did not have any GI coverage.  Patient was started on Protonix drip before transfer.  On arrival to the hospital, her blood pressures are low normal.  Repeat hemoglobin is 5.2, continue transfusion and Protonix infusion.  Seen by gastroenterology.  Most likely upper GI bleeding given nature of the melena.  Plan: Discussed with gastroenterology at bedside, updated patient and patient's daughter at the bedside. 3 units of PRBC today. Continue Protonix infusion today. Hemoglobin every 12 hours and transfuse for less than 8 because of ongoing evidence of bleeding. Clear liquid diet today.  N.p.o. past midnight for upper GI endoscopy tomorrow. May potentially need colonoscopy/bleeding scan if no obvious finding on upper GI. Continue home medications including thyroxine and anxiety medications.  There is no charge visit.

## 2021-02-16 NOTE — Consult Note (Signed)
[place] Nurse Consult Note: Reason for Consult:Stage 2 pressure injury noted by Nursing on right buttocks.  These types of injuries may be addressed via [place] for all licensed personnel, but I will address care today. Wound type:Pressure plus moisture Pressure Injury POA: Yes Measurement: Bedside RN to measure and document measurements of Stage 2 pressure injury to right buttock on Nursing Flow Sheet today (in LxWxD in cm) Wound RJJ:OACZ, moist Drainage (amount, consistency, odor) Scant serous Periwound:Intact Dressing procedure/placement/frequency:Orders provided for placement of a silicone bordered form dressing with instructions provided for changes every 3 days and PRN soiling.  Turning and reposition per protocol from side to side and to minimize time in the supine position while in bed to facilitate tissue repair and regeneration. The heels are to be floated to prevent pressure injuries.  [place] nursing team will not follow, but will remain available to this patient, the nursing and medical teams.  Please re-consult if needed. Thanks, Maudie Flakes, MSN, RN, [place], Arther Abbott  Pager# [phone]

## 2021-02-16 NOTE — Progress Notes (Signed)
Patient being transferred to 4E18. Report given to Elmyra Ricks, Therapist, sports. Patient daughter [name] contacted no answer. Nurse left voicemail of patients status. Patient ready for transport. Belongings at bedside.

## 2021-02-16 NOTE — H&P (Addendum)
History and Physical    Holly [name] PNT:[id number] DOB: [birth year] DOA: 02/16/2021  PCP: Harlan Stains, MD  Patient coming from: [place] transfer  I have personally briefly reviewed patient's old medical records in [place]  Chief Complaint: Melena  HPI: Holly [name] is a 75 y.o. female with medical history significant of Lung CA, COPD on chronic 3L home O2 and prednisone, HTN.  Pt was admitted to [facility] this past week for diverticulitis seen on CT scan after presenting to the ED at [facility] with 1 week of abd pain.  She was being treated with IV zosyn while at [facility].  She apparently was responding well to the IV zosyn; however, while at [place] she developed new onset of melanotic stool, dropped hemoglobin, and has received 2u PRBC transfusion.  Her HGB in AM of 5/2 was 7.6 at [facility].  She was put on IV PPI q12h.  Due to lack of GI coverage at [facility], she has been transferred to [facility].  Pt reports nausea and upset stomach for about the past 1 week.  No fever, chills, vomiting.  Pt confirms melena is ongoing, severe, constant, nothing makes better or worse.   Review of Systems: As per HPI, otherwise all review of systems negative.  Past Medical History:  Diagnosis Date  . Anxiety   . Asthma   . Basal cell carcinoma    lung cancer.  Skin cancer- basal - nose removed  . Bruises easily   . Cataract   . Cervicalgia   . 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 Dyspnea    uses O2 only when needed. Uses 2 L   . Foot swelling    left- has shown to Drs.  . Frequency of urination   . Headache   . 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 Hypercalcemia   . Hypertension   . Hypothyroidism   . Mixed hyperlipidemia   . Pneumonia   . Pulmonary nodule   . Sleep apnea    does not wear cpap  . Thyroid neoplasm   . Urinary incontinence   . Wears dentures   . Wears glasses     Past Surgical History:  Procedure Laterality Date  . COLONOSCOPY    .  DENTAL SURGERY    . ELBOW ARTHROPLASTY Right 2020  . EYE SURGERY Right    cataract surgery  . FOOT SURGERY  30 YRS AGO   BILATERAL  . HARDWARE REMOVAL Right 10/02/2020   Procedure: HARDWARE REMOVAL RIGHT ELBOW;  Surgeon: Shona Needles, MD;  Location: [place];  Service: Orthopedics;  Laterality: Right;  . I & D EXTREMITY Right 01/13/2021   Procedure: IRRIGATION AND DEBRIDEMENT RIGHT ELBOW;  Surgeon: Shona Needles, MD;  Location: [place];  Service: Orthopedics;  Laterality: Right;  . ORIF ELBOW FRACTURE Right 12/06/2017   Procedure: OPEN REDUCTION INTERNAL FIXATION (ORIF) ELBOW/OLECRANON FRACTURE;  Surgeon: Shona Needles, MD;  Location: [place];  Service: Orthopedics;  Laterality: Right;  . SKIN CANCER EXCISION    . THYROIDECTOMY N/A 07/31/2014   Procedure: TOTAL THYROIDECTOMY;  Surgeon: Armandina Gemma, MD;  Location: [facility];  Service: General;  Laterality: N/A;  . VAGINAL HYSTERECTOMY    . VIDEO BRONCHOSCOPY WITH ENDOBRONCHIAL NAVIGATION Bilateral 06/25/2019   Procedure: VIDEO BRONCHOSCOPY WITH ENDOBRONCHIAL NAVIGATION;  Surgeon: Lajuana Matte, MD;  Location: [place];  Service: Thoracic;  Laterality: Bilateral;     reports  that she quit smoking about 5 years ago. Her smoking use included cigarettes. She has a 30.00 pack-year smoking history. She has never used smokeless tobacco. She reports current alcohol use. She reports that she does not use drugs.  Allergies  Allergen Reactions  . Amlodipine Besylate Other (See Comments)    Other reaction(s): edema  . Codeine Nausea And Vomiting  . Oxycodone-Acetaminophen Nausea And Vomiting    Family History  Problem Relation Age of Onset  . Coronary artery disease Father   . Hypertension Father   . Asthma Father   . Allergies Father   . Hypertension Mother   . Lung cancer Mother      Prior to Admission medications   Medication Sig Start Date End Date Taking? Authorizing Provider  ALPRAZolam Duanne Moron) 0.5 MG tablet Take 0.5 mg by mouth 2  (two) times daily as needed for anxiety. 09/14/20   [provider]  aspirin EC 81 MG tablet Take 81 mg by mouth daily. Swallow whole.    [provider]  atorvastatin (LIPITOR) 20 MG tablet Take 20 mg by mouth at bedtime.     [provider]  buPROPion (WELLBUTRIN XL) 300 MG 24 hr tablet Take 300 mg by mouth daily.    [provider]  busPIRone (BUSPAR) 30 MG tablet Take 30 mg by mouth 2 (two) times daily.    [provider]  cholecalciferol (VITAMIN D) 25 MCG tablet Take 2 tablets (2,000 Units total) by mouth daily. Patient not taking: Reported on 01/06/2021 10/05/20   Delray Alt, PA-C  Dextromethorphan-guaiFENesin [facility] DM MAXIMUM STRENGTH PO) Take 15 mLs by mouth every 4 (four) hours as needed (cough/cold symptoms).    [provider]  donepezil (ARICEPT) 10 MG tablet Take 10 mg by mouth at bedtime. 12/17/20   [provider]  Fluticasone-Umeclidin-Vilant 100-62.5-25 MCG/INH AEPB Inhale 1 puff into the lungs daily. Trelegy    [provider]  furosemide (LASIX) 20 MG tablet Take 20 mg by mouth daily. 12/18/20   [provider]  ipratropium-albuterol (DUONEB) 0.5-2.5 (3) MG/3ML SOLN Inhale 3 mLs into the lungs every 4 (four) hours as needed (respiratory issues.). 01/29/20   [provider]  levothyroxine (SYNTHROID) 137 MCG tablet Take 137 mcg by mouth daily before breakfast. 09/22/20   [provider]  metoprolol succinate (TOPROL-XL) 50 MG 24 hr tablet Take 50 mg by mouth daily. Take with or immediately following a meal.    [provider]  predniSONE (DELTASONE) 10 MG tablet Take 20 mg by mouth daily with breakfast.    [provider]  traMADol (ULTRAM) 50 MG tablet Take 1 tablet (50 mg total) by mouth every 6 (six) hours as needed for severe pain. 01/13/21   Delray Alt, PA-C  valsartan-hydrochlorothiazide (DIOVAN-HCT) 160-12.5 MG tablet Take 1 tablet by mouth daily. 06/29/20    [provider]  VENTOLIN HFA 108 (90 Base) MCG/ACT inhaler INHALE 2 PUFFS INTO THE LUNGS EVERY 4 HOURS AS NEEDED FOR WHEEZING ORSHORTNESS OF BREATH Patient taking differently: Inhale 2 puffs into the lungs every 4 (four) hours as needed for wheezing or shortness of breath. 06/29/17   Parrett, Fonnie Mu, NP    Physical Exam: Vitals:   02/16/21 0251  BP: (!) 93/49  Pulse: 95  Weight: 77.1 kg  Height: 4\' 11"  (1.499 m)    Constitutional: Ill and pale appearing. Eyes: PERRL, lids and conjunctivae normal ENMT: Mucous membranes are moist. Posterior pharynx clear of any exudate or  lesions.Normal dentition.  Neck: normal, supple, no masses, no thyromegaly Respiratory: clear to auscultation bilaterally, no wheezing, no crackles. Normal respiratory effort. No accessory muscle use.  Cardiovascular: Regular rate and rhythm, no murmurs / rubs / gallops. No extremity edema. 2+ pedal pulses. No carotid bruits.  Abdomen: no tenderness, no masses palpated. No hepatosplenomegaly. Bowel sounds positive.  Musculoskeletal: no clubbing / cyanosis. No joint deformity upper and lower extremities. Good ROM, no contractures. Normal muscle tone.  Skin: Stage 2 of buttocks. Neurologic: CN 2-12 grossly intact. Sensation intact, DTR normal. Strength 5/5 in all 4.  Psychiatric: Normal judgment and insight. Alert and oriented x 3. Normal mood.    Labs on Admission: I have personally reviewed following labs and imaging studies  CBC: No results for input(s): WBC, NEUTROABS, HGB, HCT, MCV, PLT in the last 168 hours. Basic Metabolic Panel: No results for input(s): NA, K, CL, CO2, GLUCOSE, BUN, CREATININE, CALCIUM, MG, PHOS in the last 168 hours. GFR: CrCl cannot be calculated (Patient's most recent lab result is older than the maximum 21 days allowed.). Liver Function Tests: No results for input(s): AST, ALT, ALKPHOS, BILITOT, PROT, ALBUMIN in the last 168 hours. No results for input(s): LIPASE, AMYLASE in  the last 168 hours. No results for input(s): AMMONIA in the last 168 hours. Coagulation Profile: No results for input(s): INR, PROTIME in the last 168 hours. Cardiac Enzymes: No results for input(s): CKTOTAL, CKMB, CKMBINDEX, TROPONINI in the last 168 hours. BNP (last 3 results) No results for input(s): PROBNP in the last 8760 hours. HbA1C: No results for input(s): HGBA1C in the last 72 hours. CBG: No results for input(s): GLUCAP in the last 168 hours. Lipid Profile: No results for input(s): CHOL, HDL, LDLCALC, TRIG, CHOLHDL, LDLDIRECT in the last 72 hours. Thyroid Function Tests: No results for input(s): TSH, T4TOTAL, FREET4, T3FREE, THYROIDAB in the last 72 hours. Anemia Panel: No results for input(s): VITAMINB12, FOLATE, FERRITIN, TIBC, IRON, RETICCTPCT in the last 72 hours. Urine analysis: No results found for: COLORURINE, APPEARANCEUR, LABSPEC, PHURINE, GLUCOSEU, HGBUR, BILIRUBINUR, KETONESUR, PROTEINUR, UROBILINOGEN, NITRITE, LEUKOCYTESUR  Radiological Exams on Admission: No results found.  EKG: Independently reviewed.  Assessment/Plan Principal Problem:   GI bleed Active Problems:   Essential hypertension   COPD GOLD III    Chronic respiratory failure with hypoxia (HCC)   ABLA (acute blood loss anemia)   Diverticulitis   Current chronic use of systemic steroids   Pressure injury of skin    1. GIB with melena - and ABLA, and hypotension 1. Tele monitor 2. Stat CBC, type and screen 3. Suspect she may need transfusion of unit #3 here. 4. Message sent to [facility] GI for AM consult 5. PPI GTT 2. Chronic steroid use 1. Cont home prednisone at prior dose of 20mg  daily for the moment (had gotten down to 10 recently).  Increasing to "sick" dose. 2. Low threshold with hypotension to escalate to full stress dose steroids. 3. Though worth noting that steroids can worsen the diverticulitis. 4. And steroids may be the cause of GI ulcer / UGIB. 3. HTN - 1. Holding home BP meds  in setting of hypotension 4. COPD - 1. Cont home nebs 5. Diverticulitis - 1. Cont zosyn 2. Think patients GIB is upper GIB though given melena. 6. Decubitus of buttocks - 1. Wound care consult  DVT prophylaxis: SCDs Code Status: Full Family Communication: No family in room Disposition Plan: TBD Consults called: Message sent to Dr. Therisa Doyne and Dr. Watt Climes Admission status: Admit  to inpatient  Severity of Illness: The appropriate patient status for this patient is INPATIENT. Inpatient status is judged to be reasonable and necessary in order to provide the required intensity of service to ensure the patient's safety. The patient's presenting symptoms, physical exam findings, and initial radiographic and laboratory data in the context of their chronic comorbidities is felt to place them at high risk for further clinical deterioration. Furthermore, it is not anticipated that the patient will be medically stable for discharge from the hospital within 2 midnights of admission. The following factors support the patient status of inpatient.   IP status for UGIB with melena, ABLA requiring transfusions (2 already), likely need for EGD.   * I certify that at the point of admission it is my clinical judgment that the patient will require inpatient hospital care spanning beyond 2 midnights from the point of admission due to high intensity of service, high risk for further deterioration and high frequency of surveillance required.*    Karely Hurtado M. DO [facility]  How to contact the [facility] Attending or Consulting provider [place] or covering provider during after hours [place], for this patient?  1. Check the care team in [facility] and look for a) attending/consulting TRH provider listed and b) the [facility] team listed 2. Log into [email or web address]  Amion Physician Scheduling and messaging for groups and whole hospitals  On call and physician scheduling software for group practices, residents, hospitalists  and other medical providers for call, clinic, rotation and shift schedules. OnCall Enterprise is a hospital-wide system for scheduling doctors and paging doctors on call. EasyPlot is for scientific plotting and data analysis.  [email or web address]  and use [place]'s universal password to access. If you do not have the password, please contact the hospital operator.  3. Locate the [facility] provider you are looking for under [facility] and page to a number that you can be directly reached. 4. If you still have difficulty reaching the provider, please page the [facility] (Director on Call) for the Hospitalists listed on amion for assistance.  02/16/2021, 3:38 AM

## 2021-02-17 ENCOUNTER — Encounter (HOSPITAL_COMMUNITY)
Admission: AD | Disposition: A | Discharge status: Home / Self Care | Payer: Self-pay | Source: Other Acute Inpatient Hospital | Attending: Internal Medicine

## 2021-02-17 ENCOUNTER — Encounter (HOSPITAL_COMMUNITY): Payer: Self-pay | Admitting: Internal Medicine

## 2021-02-17 ENCOUNTER — Inpatient Hospital Stay (HOSPITAL_COMMUNITY): Payer: Medicare Other | Admitting: Anesthesiology

## 2021-02-17 DIAGNOSIS — E890 Postprocedural hypothyroidism: Secondary | ICD-10-CM | POA: Diagnosis not present

## 2021-02-17 DIAGNOSIS — F325 Major depressive disorder, single episode, in full remission: Secondary | ICD-10-CM | POA: Diagnosis not present

## 2021-02-17 DIAGNOSIS — E785 Hyperlipidemia, unspecified: Secondary | ICD-10-CM | POA: Diagnosis not present

## 2021-02-17 DIAGNOSIS — G4700 Insomnia, unspecified: Secondary | ICD-10-CM | POA: Diagnosis not present

## 2021-02-17 DIAGNOSIS — J449 Chronic obstructive pulmonary disease, unspecified: Secondary | ICD-10-CM | POA: Diagnosis not present

## 2021-02-17 DIAGNOSIS — N183 Chronic kidney disease, stage 3 unspecified: Secondary | ICD-10-CM | POA: Diagnosis not present

## 2021-02-17 DIAGNOSIS — I10 Essential (primary) hypertension: Secondary | ICD-10-CM | POA: Diagnosis not present

## 2021-02-17 DIAGNOSIS — I5033 Acute on chronic diastolic (congestive) heart failure: Secondary | ICD-10-CM | POA: Diagnosis not present

## 2021-02-17 DIAGNOSIS — C3490 Malignant neoplasm of unspecified part of unspecified bronchus or lung: Secondary | ICD-10-CM | POA: Diagnosis not present

## 2021-02-17 HISTORY — PX: BIOPSY: SHX5522

## 2021-02-17 HISTORY — PX: ESOPHAGOGASTRODUODENOSCOPY (EGD) WITH PROPOFOL: SHX5813

## 2021-02-17 LAB — TYPE AND SCREEN
ABO/RH(D): O POS
Antibody Screen: NEGATIVE
Unit division: 0
Unit division: 0
Unit division: 0

## 2021-02-17 LAB — CBC
HCT: 29.3 % — ABNORMAL LOW (ref 36.0–46.0)
HCT: 30.8 % — ABNORMAL LOW (ref 36.0–46.0)
Hemoglobin: 9.5 g/dL — ABNORMAL LOW (ref 12.0–15.0)
Hemoglobin: 9.7 g/dL — ABNORMAL LOW (ref 12.0–15.0)
MCH: 30.1 pg (ref 26.0–34.0)
MCH: 29.6 pg (ref 26.0–34.0)
MCHC: 32.4 g/dL (ref 30.0–36.0)
MCHC: 31.5 g/dL (ref 30.0–36.0)
MCV: 92.7 fL (ref 80.0–100.0)
MCV: 93.9 fL (ref 80.0–100.0)
Platelets: 120 10*3/uL — ABNORMAL LOW (ref 150–400)
Platelets: 125 K/uL — ABNORMAL LOW (ref 150–400)
RBC: 3.16 MIL/uL — ABNORMAL LOW (ref 3.87–5.11)
RBC: 3.28 MIL/uL — ABNORMAL LOW (ref 3.87–5.11)
RDW: 18.5 % — ABNORMAL HIGH (ref 11.5–15.5)
RDW: 19.5 % — ABNORMAL HIGH (ref 11.5–15.5)
WBC: 7.4 10*3/uL (ref 4.0–10.5)
WBC: 7.5 K/uL (ref 4.0–10.5)
nRBC: 0.4 % — ABNORMAL HIGH (ref 0.0–0.2)
nRBC: 0.5 % — ABNORMAL HIGH (ref 0.0–0.2)

## 2021-02-17 LAB — COMPREHENSIVE METABOLIC PANEL WITH GFR
ALT: 15 U/L (ref 0–44)
AST: 22 U/L (ref 15–41)
Albumin: 2 g/dL — ABNORMAL LOW (ref 3.5–5.0)
Alkaline Phosphatase: 47 U/L (ref 38–126)
Anion gap: 5 (ref 5–15)
BUN: 23 mg/dL (ref 8–23)
CO2: 34 mmol/L — ABNORMAL HIGH (ref 22–32)
Calcium: 8.8 mg/dL — ABNORMAL LOW (ref 8.9–10.3)
Chloride: 106 mmol/L (ref 98–111)
Creatinine, Ser: 1.01 mg/dL — ABNORMAL HIGH (ref 0.44–1.00)
GFR, Estimated: 58 mL/min — ABNORMAL LOW
Glucose, Bld: 106 mg/dL — ABNORMAL HIGH (ref 70–99)
Potassium: 4 mmol/L (ref 3.5–5.1)
Sodium: 145 mmol/L (ref 135–145)
Total Bilirubin: 0.4 mg/dL (ref 0.3–1.2)
Total Protein: 4.3 g/dL — ABNORMAL LOW (ref 6.5–8.1)

## 2021-02-17 LAB — MAGNESIUM: Magnesium: 1.6 mg/dL — ABNORMAL LOW (ref 1.7–2.4)

## 2021-02-17 LAB — BPAM RBC
Blood Product Expiration Date: 202206012359
Blood Product Expiration Date: 202206012359
Blood Product Expiration Date: 202206012359
ISSUE DATE / TIME: 202205030956
ISSUE DATE / TIME: 202205031518
ISSUE DATE / TIME: 202205032035
Unit Type and Rh: 5100
Unit Type and Rh: 5100
Unit Type and Rh: 5100

## 2021-02-17 LAB — PHOSPHORUS: Phosphorus: 2.8 mg/dL (ref 2.5–4.6)

## 2021-02-17 SURGERY — ESOPHAGOGASTRODUODENOSCOPY (EGD) WITH PROPOFOL
Anesthesia: Monitor Anesthesia Care

## 2021-02-17 MED ORDER — LIDOCAINE 2% (20 MG/ML) 5 ML SYRINGE
INTRAMUSCULAR | Status: DC | PRN
  Administered 2021-02-17: 40 mg via INTRAVENOUS

## 2021-02-17 MED ORDER — LACTATED RINGERS IV SOLN: INTRAVENOUS | Status: DC | PRN

## 2021-02-17 MED ORDER — PANTOPRAZOLE SODIUM 40 MG PO TBEC
40.0000 mg | DELAYED_RELEASE_TABLET | Freq: Two times a day (BID) | ORAL | Status: DC
  Administered 2021-02-17 – 2021-02-18 (×3): 40 mg via ORAL
  Filled 2021-02-17 (×3): qty 1

## 2021-02-17 MED ORDER — PROPOFOL 500 MG/50ML IV EMUL
INTRAVENOUS | Status: DC | PRN
  Administered 2021-02-17: 100 ug/kg/min via INTRAVENOUS

## 2021-02-17 SURGICAL SUPPLY — 15 items

## 2021-02-17 NOTE — Progress Notes (Signed)
PROGRESS NOTE    Holly [name]  XHF:[id number] DOB: [birth year] DOA: 02/16/2021 PCP: Harlan Stains, MD     Brief Narrative:  Holly [name] is a 75 year old female with history of lung cancer status post radiation treatment, COPD and chronic hypoxemic respiratory failure on 3 L of home oxygen, chronic prednisone 20 mg daily and hypertension presented to [facility] with about 1 week of abdominal pain.  A CT scan showed diverticulitis without complication and she was getting antibiotics.  After admission at [facility], patient was started having melanotic stool, abdominal discomfort.  She dropped her hemoglobin to 6.  She had received 2 units of transfusion and sent to [facility] because they did not have any GI coverage.  Patient was started on Protonix drip before transfer.  New events last 24 hours / Subjective: Patient underwent EGD this morning.  About to order lunch.  Has no complaints.  Assessment & Plan:   Principal Problem:   GI bleed Active Problems:   Essential hypertension   COPD GOLD III    Chronic respiratory failure with hypoxia (HCC)   ABLA (acute blood loss anemia)   Diverticulitis   Current chronic use of systemic steroids   Pressure injury of skin   Upper GI bleed, melena -Status post EGD 5/4 -LA grade a reflux esophagitis with no bleeding, small hiatal hernia, nonbleeding gastric ulcers with no stigmata of bleeding, duodenitis.  Recommended repeat EGD in 3 months to check healing.  Follow-up with Dr. Alessandra Bevels in 2 months.  Continue Protonix 40 mg p.o. twice daily for 2 months  Acute blood loss anemia -Status post 3 unit packed red blood cell transfusion -Hemoglobin stable  Diverticulitis -Diagnosed at outside facility, transition to Augmentin on discharge  COPD with chronic hypoxemic respiratory failure, chronic steroid use -Continue prednisone  Anxiety/depression -Continue Xanax, Wellbutrin, BuSpar  Dementia -Continue  Aricept  Hypothyroidism -Continue Synthroid  In agreement with assessment of the pressure ulcer as below:  Pressure Injury 02/16/21 Buttocks Right Stage 2 -  Partial thickness loss of dermis presenting as a shallow open injury with a red, pink wound bed without slough. (Active)  02/16/21 0230  Location: Buttocks  Location Orientation: Right  Staging: Stage 2 -  Partial thickness loss of dermis presenting as a shallow open injury with a red, pink wound bed without slough.  Wound Description (Comments):   Present on Admission: Yes         DVT prophylaxis:  SCDs Start: 02/16/21 0243  Code Status:     Code Status Orders  (From admission, onward)         Start     Ordered   02/16/21 0243  Full code  Continuous        02/16/21 0245        Code Status History    Date Active Date Inactive Code Status Order ID Comments User Context   07/31/2014 1311 08/01/2014 1640 Full Code [id number]  Armandina Gemma, MD Inpatient   Advance Care Planning Activity     Family Communication: Family friend at bedside Disposition Plan:  Status is: Inpatient  Remains inpatient appropriate because:Inpatient level of care appropriate due to severity of illness   Dispo: The patient is from: Home              Anticipated d/c is to: Home              Patient currently is not medically stable to d/c.   Difficult to place patient  No    Antimicrobials:  Anti-infectives (From admission, onward)   Start     Dose/Rate Route Frequency Ordered Stop   02/16/21 0400  piperacillin-tazobactam (ZOSYN) IVPB 3.375 g        3.375 g 12.5 mL/hr over 240 Minutes Intravenous Every 8 hours 02/16/21 0339          Objective: Vitals:   02/17/21 0905 02/17/21 0915 02/17/21 1007 02/17/21 1129  BP: (!) 106/48 (!) 134/38 114/67 (!) 123/59  Pulse: 92 94 88 78  Resp: (!) 23 (!) 25 20 18   Temp:   97.6 F (36.4 C) 97.9 F (36.6 C)  TempSrc:   Oral Oral  SpO2: 96% 93% 94% 95%  Weight:      Height:         Intake/Output Summary (Last 24 hours) at 02/17/2021 1501 Last data filed at 02/17/2021 0847 Gross per 24 hour  Intake 1019.92 ml  Output 2 ml  Net 1017.92 ml   Filed Weights   02/16/21 0251  Weight: 77.1 kg    Examination:  General exam: Appears calm and comfortable  Respiratory system: Clear to auscultation. Respiratory effort normal. No respiratory distress. No conversational dyspnea.  Cardiovascular system: S1 & S2 heard, RRR. No murmurs. No pedal edema. Gastrointestinal system: Abdomen is nondistended, soft and nontender. Normal bowel sounds heard. Central nervous system: Alert and oriented. No focal neurological deficits. Speech clear.  Extremities: Symmetric in appearance  Skin: No rashes, lesions or ulcers on exposed skin  Psychiatry: Judgement and insight appear normal. Mood & affect appropriate.   Data Reviewed: I have personally reviewed following labs and imaging studies  CBC: Recent Labs  Lab 02/16/21 0245 02/17/21 0120  WBC 8.4 7.4  NEUTROABS 5.9  --   HGB 5.2* 9.5*  HCT 17.1* 29.3*  MCV 101.2* 92.7  PLT 148* 956*   Basic Metabolic Panel: Recent Labs  Lab 02/16/21 0245 02/17/21 0120  NA 144 145  K 3.1* 4.0  CL 101 106  CO2 35* 34*  GLUCOSE 138* 106*  BUN 29* 23  CREATININE 1.26* 1.01*  CALCIUM 8.2* 8.8*  MG  --  1.6*  PHOS  --  2.8   GFR: Estimated Creatinine Clearance: 42.5 mL/min (A) (by C-G formula based on SCr of 1.01 mg/dL (H)). Liver Function Tests: Recent Labs  Lab 02/16/21 0245 02/17/21 0120  AST 28 22  ALT 17 15  ALKPHOS 45 47  BILITOT 0.6 0.4  PROT 3.9* 4.3*  ALBUMIN 1.8* 2.0*   No results for input(s): LIPASE, AMYLASE in the last 168 hours. No results for input(s): AMMONIA in the last 168 hours. Coagulation Profile: Recent Labs  Lab 02/16/21 0245  INR 1.2   Cardiac Enzymes: No results for input(s): CKTOTAL, CKMB, CKMBINDEX, TROPONINI in the last 168 hours. BNP (last 3 results) No results for input(s): PROBNP in the  last 8760 hours. HbA1C: No results for input(s): HGBA1C in the last 72 hours. CBG: No results for input(s): GLUCAP in the last 168 hours. Lipid Profile: No results for input(s): CHOL, HDL, LDLCALC, TRIG, CHOLHDL, LDLDIRECT in the last 72 hours. Thyroid Function Tests: No results for input(s): TSH, T4TOTAL, FREET4, T3FREE, THYROIDAB in the last 72 hours. Anemia Panel: No results for input(s): VITAMINB12, FOLATE, FERRITIN, TIBC, IRON, RETICCTPCT in the last 72 hours. Sepsis Labs: No results for input(s): PROCALCITON, LATICACIDVEN in the last 168 hours.  Recent Results (from the past 240 hour(s))  MRSA PCR Screening     Status: None   Collection  Time: 02/16/21  3:05 PM   Specimen: Nasopharyngeal  Result Value Ref Range Status   MRSA by PCR NEGATIVE NEGATIVE Final    Comment:        The GeneXpert MRSA Assay (FDA approved for NASAL specimens only), is one component of a comprehensive MRSA colonization surveillance program. It is not intended to diagnose MRSA infection nor to guide or monitor treatment for MRSA infections. Performed at [facility], [place] [address] [place], [place] [address]       Radiology Studies: No results found.    Scheduled Meds: . sodium chloride   Intravenous Once  . buPROPion  300 mg Oral Daily  . busPIRone  30 mg Oral BID  . donepezil  10 mg Oral QHS  . fluticasone furoate-vilanterol  1 puff Inhalation Daily  . levothyroxine  137 mcg Oral QAC breakfast  . liver oil-zinc oxide   Topical BID  . pantoprazole  40 mg Oral BID  . potassium chloride  40 mEq Oral BID  . predniSONE  20 mg Oral Q breakfast  . umeclidinium bromide  1 puff Inhalation Daily   Continuous Infusions: . piperacillin-tazobactam (ZOSYN)  IV 3.375 g (02/17/21 1112)     LOS: 1 day      Time spent: 20 minutes   Dessa Phi, DO [facility] 02/17/2021, 3:01 PM   Available via Epic secure chat 7am-7pm After these hours, please refer to coverage provider  listed on [email or web address]

## 2021-02-17 NOTE — Interval H&P Note (Signed)
History and Physical Interval Note:  02/17/2021 8:23 AM  Holly [name]  has presented today for surgery, with the diagnosis of GI bleed/melena.  The various methods of treatment have been discussed with the patient and family. After consideration of risks, benefits and other options for treatment, the patient has consented to  Procedure(s): ESOPHAGOGASTRODUODENOSCOPY (EGD) WITH PROPOFOL (N/A) as a surgical intervention.  The patient's history has been reviewed, patient examined, no change in status, stable for surgery.  I have reviewed the patient's chart and labs.  Questions were answered to the patient's satisfaction.    Risks (bleeding, infection, bowel perforation that could require surgery, sedation-related changes in cardiopulmonary systems), benefits (identification and possible treatment of source of symptoms, exclusion of certain causes of symptoms), and alternatives (watchful waiting, radiographic imaging studies, empiric medical treatment)  were explained to patient/family in detail and patient wishes to proceed.    Holly [name]

## 2021-02-17 NOTE — Anesthesia Postprocedure Evaluation (Signed)
Anesthesia Post Note  Patient: Holly [name]  Procedure(s) Performed: ESOPHAGOGASTRODUODENOSCOPY (EGD) WITH PROPOFOL (N/A ) BIOPSY     Patient location during evaluation: PACU Anesthesia Type: MAC Level of consciousness: awake and alert Pain management: pain level controlled Vital Signs Assessment: post-procedure vital signs reviewed and stable Respiratory status: spontaneous breathing, nonlabored ventilation, respiratory function stable and patient connected to nasal cannula oxygen Cardiovascular status: stable and blood pressure returned to baseline Postop Assessment: no apparent nausea or vomiting Anesthetic complications: no   No complications documented.  Last Vitals:  Vitals:   02/17/21 0900 02/17/21 0905  BP: (!) 106/48 (!) 106/48  Pulse: 91 92  Resp: 20 (!) 23  Temp:    SpO2: 98% 96%    Last Pain:  Vitals:   02/17/21 0905  TempSrc:   PainSc: 0-No pain                 Gerarda Conklin COKER

## 2021-02-17 NOTE — Transfer of Care (Signed)
Immediate Anesthesia Transfer of Care Note  Patient: Holly [name]  Procedure(s) Performed: ESOPHAGOGASTRODUODENOSCOPY (EGD) WITH PROPOFOL (N/A ) BIOPSY  Patient Location: Endoscopy Unit  Anesthesia Type:MAC  Level of Consciousness: awake, alert , oriented and patient cooperative  Airway & Oxygen Therapy: Patient Spontanous Breathing and Patient connected to nasal cannula oxygen  Post-op Assessment: Report given to RN, Post -op Vital signs reviewed and stable and Patient moving all extremities  Post vital signs: Reviewed and stable  Last Vitals:  Vitals Value Taken Time  BP 101/41 02/17/21 0852  Temp    Pulse 95 02/17/21 0854  Resp 24 02/17/21 0854  SpO2 95 % 02/17/21 0854  Vitals shown include unvalidated device data.  Last Pain:  Vitals:   02/17/21 0804  TempSrc: Tympanic  PainSc: 0-No pain         Complications: No complications documented.

## 2021-02-17 NOTE — Brief Op Note (Signed)
02/16/2021 - 02/17/2021  8:53 AM  PATIENT:  Holly [name]  75 y.o. female  PRE-OPERATIVE DIAGNOSIS:  GI bleed/melena  POST-OPERATIVE DIAGNOSIS:  gastric ulcers, no active bleeding  PROCEDURE:  Procedure(s): ESOPHAGOGASTRODUODENOSCOPY (EGD) WITH PROPOFOL (N/A) BIOPSY  SURGEON:  Surgeon(s) and Role:    * Karlen Barbar, MD - Primary  Findings ----------- -EGD showed multiple, small, superficial clean-based gastric ulcers.  No active bleeding.  Biopsies taken.  Recommendations ----------------------- -Switch Protonix to 40 mg p.o. twice daily. -Continue Protonix 40 mg p.o. twice a day for 2 months followed by Protonix 40 mg once a day indefinitely as she will be on chronic prednisone and aspirin -Recommend repeat EGD in 3 months to document healing of gastric ulcers -Soft diet and advance as tolerated -Avoid NSAIDs -Follow-up in GI clinic in 2 months. -GI will sign off.  Call us back if needed -Findings discussed with patient's daughter over the phone.   Otis Brace MD, [place] 02/17/2021, 8:55 AM  Contact #  [phone]

## 2021-02-17 NOTE — Anesthesia Preprocedure Evaluation (Signed)
Anesthesia Evaluation  Patient identified by MRN, date of birth, ID band Patient awake    Reviewed: Allergy & Precautions, NPO status , Patient's Chart, lab work & pertinent test results  Airway Mallampati: II  TM Distance: >3 FB     Dental  (+) Edentulous Upper, Edentulous Lower   Pulmonary former smoker,     + decreased breath sounds      Cardiovascular hypertension,  Rhythm:Regular Rate:Normal     Neuro/Psych    GI/Hepatic   Endo/Other    Renal/GU      Musculoskeletal   Abdominal   Peds  Hematology   Anesthesia Other Findings   Reproductive/Obstetrics                             Anesthesia Physical Anesthesia Plan  ASA: III  Anesthesia Plan: MAC   Post-op Pain Management:    Induction: Intravenous  PONV Risk Score and Plan: Propofol infusion  Airway Management Planned: Natural Airway and Nasal Cannula  Additional Equipment:   Intra-op Plan:   Post-operative Plan:   Informed Consent: I have reviewed the patients History and Physical, chart, labs and discussed the procedure including the risks, benefits and alternatives for the proposed anesthesia with the patient or authorized representative who has indicated his/her understanding and acceptance.       Plan Discussed with: CRNA and Anesthesiologist  Anesthesia Plan Comments:         Anesthesia Quick Evaluation

## 2021-02-17 NOTE — Op Note (Signed)
[facility] Patient Name: Holly [name] Procedure Date : 02/17/2021 MRN: [id number] Attending MD: Otis Brace , MD Date of Birth: July 13, 1946 CSN: [id number] Age: 75 Admit Type: Inpatient Procedure:                Upper GI endoscopy Indications:              Melena Providers:                Otis Brace, MD, Erenest Rasher, RN, Elspeth Cho Tech., Technician Referring MD:              Medicines:                Sedation Administered by an Anesthesia Professional Complications:            No immediate complications. Estimated Blood Loss:     Estimated blood loss was minimal. Procedure:                Pre-Anesthesia Assessment:                           - Prior to the procedure, a History and Physical                            was performed, and patient medications and                            allergies were reviewed. The patient's tolerance of                            previous anesthesia was also reviewed. The risks                            and benefits of the procedure and the sedation                            options and risks were discussed with the patient.                            All questions were answered, and informed consent                            was obtained. Prior Anticoagulants: The patient has                            taken no previous anticoagulant or antiplatelet                            agents except for aspirin. ASA Grade Assessment:                            III - A patient with severe systemic disease. After  reviewing the risks and benefits, the patient was                            deemed in satisfactory condition to undergo the                            procedure.                           After obtaining informed consent, the endoscope was                            passed under direct vision. Throughout the                            procedure, the patient's blood  pressure, pulse, and                            oxygen saturations were monitored continuously. The                            GIF-H190 ([id number]) Olympus gastroscope was                            introduced through the mouth, and advanced to the                            second part of duodenum. The upper GI endoscopy was                            accomplished without difficulty. The patient                            tolerated the procedure well. Scope In: Scope Out: Findings:      LA Grade A (one or more mucosal breaks less than 5 mm, not extending       between tops of 2 mucosal folds) esophagitis with no bleeding was found       in the distal esophagus.      A small hiatal hernia was present.      Many, small, non-bleeding linear and superficial gastric ulcers with no       stigmata of bleeding were found in the gastric body and in the gastric       antrum. Biopsies were taken with a cold forceps for histology.      The cardia and gastric fundus were normal on retroflexion.      Scattered mild inflammation characterized by congestion (edema) and       erythema was found in the duodenal bulb.      The first portion of the duodenum and second portion of the duodenum       were normal. Impression:               - LA Grade A reflux esophagitis with no bleeding.                           - Small  hiatal hernia.                           - Non-bleeding gastric ulcers with no stigmata of                            bleeding. Biopsied.                           - Duodenitis.                           - Normal first portion of the duodenum and second                            portion of the duodenum. Recommendation:           - Return patient to hospital ward for ongoing care.                           - Soft diet.                           - Continue present medications.                           - Await pathology results.                           - Repeat upper endoscopy in 3 months  to check                            healing.                           - Return to my office in 2 months.                           - Use Protonix (pantoprazole) 40 mg PO BID for 2                            months. Procedure Code(s):        --- Professional ---                           [phone], Esophagogastroduodenoscopy, flexible,                            transoral; with biopsy, single or multiple Diagnosis Code(s):        --- Professional ---                           K21.00, Gastro-esophageal reflux disease with                            esophagitis, without bleeding  K44.9, Diaphragmatic hernia without obstruction or                            gangrene                           K25.9, Gastric ulcer, unspecified as acute or                            chronic, without hemorrhage or perforation                           K29.80, Duodenitis without bleeding                           K92.1, Melena (includes Hematochezia) CPT copyright 2019 American Medical Association. All rights reserved. The codes documented in this report are preliminary and upon coder review may  be revised to meet current compliance requirements. Otis Brace, MD Otis Brace, MD 02/17/2021 8:53:41 AM Number of Addenda: 0

## 2021-02-18 ENCOUNTER — Encounter (HOSPITAL_COMMUNITY): Payer: Self-pay | Admitting: Gastroenterology

## 2021-02-18 ENCOUNTER — Ambulatory Visit (HOSPITAL_COMMUNITY): Payer: Medicare Other

## 2021-02-18 LAB — COMPREHENSIVE METABOLIC PANEL
ALT: 15 U/L (ref 0–44)
AST: 23 U/L (ref 15–41)
Albumin: 2.2 g/dL — ABNORMAL LOW (ref 3.5–5.0)
Alkaline Phosphatase: 57 U/L (ref 38–126)
Anion gap: 6 (ref 5–15)
BUN: 19 mg/dL (ref 8–23)
CO2: 33 mmol/L — ABNORMAL HIGH (ref 22–32)
Calcium: 9.3 mg/dL (ref 8.9–10.3)
Chloride: 104 mmol/L (ref 98–111)
Creatinine, Ser: 1.1 mg/dL — ABNORMAL HIGH (ref 0.44–1.00)
GFR, Estimated: 52 mL/min — ABNORMAL LOW (ref >60)
Glucose, Bld: 83 mg/dL (ref 70–99)
Potassium: 3.7 mmol/L (ref 3.5–5.1)
Sodium: 143 mmol/L (ref 135–145)
Total Bilirubin: 0.7 mg/dL (ref 0.3–1.2)
Total Protein: 4.7 g/dL — ABNORMAL LOW (ref 6.5–8.1)

## 2021-02-18 LAB — CBC
HCT: 31.7 % — ABNORMAL LOW (ref 36.0–46.0)
Hemoglobin: 9.7 g/dL — ABNORMAL LOW (ref 12.0–15.0)
MCH: 29.6 pg (ref 26.0–34.0)
MCHC: 30.6 g/dL (ref 30.0–36.0)
MCV: 96.6 fL (ref 80.0–100.0)
Platelets: 121 10*3/uL — ABNORMAL LOW (ref 150–400)
RBC: 3.28 MIL/uL — ABNORMAL LOW (ref 3.87–5.11)
RDW: 19.4 % — ABNORMAL HIGH (ref 11.5–15.5)
WBC: 6.3 10*3/uL (ref 4.0–10.5)
nRBC: 0.3 % — ABNORMAL HIGH (ref 0.0–0.2)

## 2021-02-18 LAB — SURGICAL PATHOLOGY

## 2021-02-18 MED ORDER — AMOXICILLIN-POT CLAVULANATE 875-125 MG PO TABS: 1.0000 | ORAL_TABLET | Freq: Two times a day (BID) | ORAL | 0 refills | Status: AC

## 2021-02-18 MED ORDER — METOPROLOL SUCCINATE ER 50 MG PO TB24
50.0000 mg | ORAL_TABLET | Freq: Every day | ORAL | Status: DC
  Administered 2021-02-18: 50 mg via ORAL
  Filled 2021-02-18: qty 1

## 2021-02-18 MED ORDER — ATORVASTATIN CALCIUM 10 MG PO TABS: 20.0000 mg | ORAL_TABLET | Freq: Every day | ORAL | Status: DC

## 2021-02-18 MED ORDER — FUROSEMIDE 20 MG PO TABS
20.0000 mg | ORAL_TABLET | Freq: Every day | ORAL | Status: DC
  Administered 2021-02-18: 20 mg via ORAL
  Filled 2021-02-18: qty 1

## 2021-02-18 MED ORDER — PANTOPRAZOLE SODIUM 40 MG PO TBEC: 40.0000 mg | DELAYED_RELEASE_TABLET | Freq: Two times a day (BID) | ORAL | 0 refills | Status: DC

## 2021-02-18 NOTE — Progress Notes (Signed)
  PROGRESS NOTE  Patient was set to be discharged home today, but now complaints of some edema and weakness. She has received total 5 u pRBC between the 2 hospitalizations. Resume home lasix today, PT OT evaluation. Hopeful discharge in AM.     Holly Phi, DO [facility] 02/18/2021, 12:05 PM  Available via Epic secure chat 7am-7pm After these hours, please refer to coverage provider listed on [email or web address]

## 2021-02-18 NOTE — Evaluation (Signed)
Physical Therapy Evaluation Patient Details Name: Holly [name] MRN: [id number] DOB: March 26, 1946 Today's Date: 02/18/2021   History of Present Illness  [name] is a 75 year old female  presented to [facility] with about 1 week of abdominal pain.  A CT scan showed diverticulitis without complication and she was getting antibiotics.  After admission at [facility], patient started having melanotic stool, abdominal discomfort and her hemoglobin dropped to 6.  She had received 2 units of transfusion and was  sent to [facility] because they did not have any GI coverage.  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 Pt was able to ambulate with RW with overall good safety.  Did need up to 6L O2 with activity. Pt had O2 PTA. Daughter and friend who is a caregiver were in room and feel comfortable caring for pt at home.   Pt currently with functional limitations due to the deficits listed below (see PT Problem List). Pt will benefit from skilled PT to increase their independence and safety with mobility to allow discharge to the venue listed below.      Follow Up Recommendations Home health PT (HHOT, HHaide)    Equipment Recommendations  3in1 (PT)    Recommendations for Other Services       Precautions / Restrictions Precautions Precautions: Fall Restrictions Weight Bearing Restrictions: No      Mobility  Bed Mobility Overal bed mobility: Independent                  Transfers Overall transfer level: Needs assistance Equipment used: Rolling walker (2 wheeled) Transfers: Sit to/from Stand Sit to Stand: Min guard         General transfer comment:  No physical assist needed but cues for hand placement  Ambulation/Gait Ambulation/Gait assistance: Min guard Gait Distance (Feet): 50 Feet Assistive device: Rolling walker (2 wheeled) Gait Pattern/deviations: Step-through pattern;Decreased stride length;Trunk flexed;Wide base of support;Drifts right/left   Gait velocity interpretation: <1.31 ft/sec, indicative of household ambulator General Gait Details: Pt was able to ambulate with min guard assist and cues for staying close to RW and for upright posture as she flexes as she fatigues. No LOB with RW with overall good safety. Pt was on 2L at rest but needed 6L to maintain sats >88% with activity.  Stairs            Wheelchair Mobility    Modified Rankin (Stroke Patients Only)       Balance Overall balance assessment: Needs assistance Sitting-balance support: Feet supported;No upper extremity supported Sitting balance-Leahy Scale: Fair     Standing balance support: Bilateral upper extremity supported;During functional activity Standing balance-Leahy Scale: Poor Standing balance comment: relies on UE support                             Pertinent Vitals/Pain Pain Assessment: No/denies pain    Home Living Family/patient expects to be discharged to:: Private residence Living Arrangements: Children;Non-relatives/Friends (daughter works and friend will stay with pt) Available Help at Discharge: Family;Available 24 hours/day Type of Home: Mobile home Home Access: Stairs to enter;Other (comment) (Ramp to be installed in next 2 weeks) Entrance Stairs-Rails: Right;Left;Can reach  both Entrance Stairs-Number of Steps: 4 Home Layout: One level Home Equipment: Shower seat;Cane - single point;Walker - 4 wheels;Grab bars - tub/shower;Wheelchair - manual (home O2 at 3L, pulse oximeter)      Prior Function Level of Independence: Needs assistance   Gait / Transfers Assistance Needed: used rollator most of time and could  walk with Modif I  ADL's / Homemaking Assistance Needed: assistance with ADLs and IADLs.- pt states her daughter assisted with washing her hair only        Hand Dominance   Dominant Hand: Right    Extremity/Trunk Assessment   Upper Extremity Assessment Upper Extremity Assessment: Defer to OT evaluation    Lower Extremity Assessment Lower Extremity Assessment: Generalized weakness    Cervical / Trunk Assessment Cervical / Trunk Assessment: Normal;Kyphotic  Communication   Communication: No difficulties  Cognition Arousal/Alertness: Awake/alert Behavior During Therapy: WFL for tasks assessed/performed Overall Cognitive Status: Within Functional Limits for tasks assessed                                        General Comments      Exercises General Exercises - Lower Extremity Ankle Circles/Pumps: AROM;Both;5 reps;Supine Long Arc Quad: AROM;Both;10 reps;Seated   Assessment/Plan    PT Assessment Patient needs continued PT services  PT Problem List Decreased activity tolerance;Decreased balance;Decreased mobility;Decreased knowledge of use of DME;Decreased safety awareness;Decreased knowledge of precautions;Cardiopulmonary status limiting activity       PT Treatment Interventions DME instruction;Gait training;Functional mobility training;Therapeutic activities;Therapeutic exercise;Balance training;Patient/family education    PT Goals (Current goals can be found in the Care Plan section)  Acute Rehab PT Goals Patient Stated Goal: to go home PT Goal Formulation: With patient Time For Goal Achievement: 03/04/21 Potential to Achieve Goals: Good    Frequency Min 3X/week   Barriers to discharge        Co-evaluation               AM-PAC PT "6 Clicks" Mobility  Outcome Measure Help needed turning from your back to your side while in a flat bed without using bedrails?: None Help needed moving from lying on your back to sitting on the side of a  flat bed without using bedrails?: None Help needed moving to and from a bed to a chair (including a wheelchair)?: A Little Help needed standing up from a chair using your arms (e.g., wheelchair or bedside chair)?: A Little Help needed to walk in hospital room?: A Little Help needed climbing 3-5 steps with a railing? : A Little 6 Click Score: 20    End of Session Equipment Utilized During Treatment: Gait belt;Oxygen Activity Tolerance: Patient limited by fatigue Patient left: in chair;with call bell/phone within reach;with family/visitor present Nurse Communication: Mobility status PT Visit Diagnosis: Muscle weakness (generalized) (M62.81)    Time: 9892-1194 PT Time Calculation (min) (ACUTE ONLY): 33 min   Charges:   PT Evaluation $PT Eval Moderate Complexity: 1 Mod PT Treatments $Gait Training: 8-22 mins        Blu Lori M,PT Acute Rehab Services [phone] (pager)  Alvira Philips 02/18/2021, 4:18 PM

## 2021-02-18 NOTE — Plan of Care (Signed)

## 2021-02-18 NOTE — TOC Transition Note (Signed)
Transition of Care (TOC) - CM/SW Discharge Note Marvetta Gibbons RN, BSN Transitions of Care Unit 4E- RN Case Manager See Treatment Team for direct phone #    Patient Details  Name: Holly [name] MRN: [id number] Date of Birth: 1946/06/22  Transition of Care [facility]) CM/SW Contact:  Dawayne Patricia, RN Phone Number: 02/18/2021, 3:23 PM   Clinical Narrative:    Pt stable for transition home today after PT eval. Orders placed for [facility] and DME needs.  Cm spoke with pt and daughter at the bedside to discuss [facility] and DME- list provided for [facility] choice Per CMS guidelines from medicare.gov website with star ratings (copy placed in shadow chart), pt voiced that she has used [facility] in the past and will select them again for [facility] needs.  Pt has RW and home 02 at home (02 with [facility]- 3L baseline). Need 3n1 for home and agreeable to use in house provider- [facility] for this.   Address confirmed as [place] [address]  Call made to [facility] for DME- 3n1 to be delivered to room prior to discharge.   Call made to [facility] with [facility] for [facility] referral- referral for PT/OT/aide has been accepted.    Final next level of care: [place] Barriers to Discharge: No Barriers Identified   Patient Goals and CMS Choice Patient states their goals for this hospitalization and ongoing recovery are:: return home CMS Medicare.gov Compare Post Acute Care list provided to:: Patient Choice offered to / list presented to : Patient  Discharge Placement               Home with [facility]        Discharge Plan and Services   Discharge Planning Services: CM Consult Post Acute Care Choice: Home Health,Durable Medical Equipment          DME Arranged: 3-N-1 DME Agency: [facility] Date DME Agency Contacted: 02/18/21 Time DME Agency Contacted: [phone] Representative spoke with at DME Agency: Sheila [place]: PT,OT,Nurse's [place]: [place] Date  [place]: 02/18/21 Time [place]: 9038 Representative spoke with at [place]: [place] ([place]) Interventions     Readmission Risk Interventions Readmission Risk Prevention Plan 02/18/2021  Transportation Screening Complete  PCP or Specialist Appt within 5-7 Days Complete  Home Care Screening Complete  Medication Review (RN CM) Complete  Some recent data might be hidden

## 2021-02-18 NOTE — Plan of Care (Signed)
  Problem: Education: Goal: Knowledge of General Education information will improve Description: Including pain rating scale, medication(s)/side effects and non-pharmacologic comfort measures 02/18/2021 1705 by Caroll Rancher, RN Outcome: Adequate for Discharge 02/18/2021 1431 by Caroll Rancher, RN Outcome: Progressing   Problem: Health Behavior/Discharge Planning: Goal: Ability to manage health-related needs will improve Outcome: Adequate for Discharge   Problem: Clinical Measurements: Goal: Ability to maintain clinical measurements within normal limits will improve Outcome: Adequate for Discharge Goal: Will remain free from infection Outcome: Adequate for Discharge Goal: Diagnostic test results will improve Outcome: Adequate for Discharge Goal: Respiratory complications will improve 02/18/2021 1705 by Caroll Rancher, RN Outcome: Adequate for Discharge 02/18/2021 1431 by Caroll Rancher, RN Outcome: Progressing Goal: Cardiovascular complication will be avoided Outcome: Adequate for Discharge

## 2021-02-18 NOTE — Discharge Summary (Signed)
Physician Discharge Summary  Holly [name] KTG:[id number] DOB: 1946-07-12 DOA: 02/16/2021  PCP: Harlan Stains, MD  Admit date: 02/16/2021 Discharge date: 02/18/2021  Admitted From: Home --[facility]  Disposition:  Home   Recommendations for Outpatient Follow-up:  1. Follow up with PCP in 1 week 2. Follow up with GI in 2 months   Discharge Condition: Stable CODE STATUS: Full  Diet recommendation:  Diet Orders (From admission, onward)    Start     Ordered   02/17/21 0901  DIET SOFT Room service appropriate? Yes; Fluid consistency: Thin  Diet effective now       Question Answer Comment  Room service appropriate? Yes   Fluid consistency: Thin      02/17/21 0901         Brief/Interim Summary: Holly [name] is a 75 year old female with history of lung cancer status post radiation treatment, COPD and chronic hypoxemic respiratory failure on 3 L of home oxygen, chronic prednisone 20 mg daily and hypertension presented to [facility] with about 1 week of abdominal pain. A CT scan showed diverticulitis without complication and she was getting antibiotics. After admission at [facility], patient was started having melanotic stool, abdominal discomfort. She dropped her hemoglobin to 6. She had received 2 units of transfusion and sent to [facility] because they did not have any GI coverage. Patient was started on Protonix drip before transfer.  She received 3 additional units of blood transfusion at [facility].  She underwent EGD on 5/4 which revealed LA grade a reflux esophagitis with no bleeding, small hiatal hernia, nonbleeding gastric ulcers with no stigmata of bleeding, duodenitis.  Patient's diet was advanced and hemoglobin remained stable.  Discharge Diagnoses:  Principal Problem:   GI bleed Active Problems:   Essential hypertension   COPD GOLD III    Chronic respiratory failure with hypoxia (HCC)   ABLA (acute blood loss anemia)   Diverticulitis    Current chronic use of systemic steroids   Pressure injury of skin   Upper GI bleed, melena -Status post EGD 5/4 -LA grade a reflux esophagitis with no bleeding, small hiatal hernia, nonbleeding gastric ulcers with no stigmata of bleeding, duodenitis.  Recommended repeat EGD in 3 months to check healing.  Follow-up with Dr. Alessandra Bevels in 2 months.  Continue Protonix 40 mg p.o. twice daily for 2 months  Acute blood loss anemia -Status post 5 unit packed red blood cell transfusion -Hemoglobin stable  Diverticulitis -Diagnosed at outside facility, started on Zosyn on 4/28.  Patient still having frequent loose stools.  Discontinue rectal tube.  Augmentin on discharge  COPD with chronic hypoxemic respiratory failure, chronic steroid use -Continue prednisone  Anxiety/depression -Continue Xanax, Wellbutrin, BuSpar  Dementia -Continue Aricept  Hypothyroidism -Continue Synthroid   In agreement with assessment of the pressure ulcer as below:  Pressure Injury 02/16/21 Buttocks Right Stage 2 -  Partial thickness loss of dermis presenting as a shallow open injury with a red, pink wound bed without slough. (Active)  02/16/21 0230  Location: Buttocks  Location Orientation: Right  Staging: Stage 2 -  Partial thickness loss of dermis presenting as a shallow open injury with a red, pink wound bed without slough.  Wound Description (Comments):   Present on Admission: Yes       Discharge Instructions  Discharge Instructions    Call MD for:  difficulty breathing, headache or visual disturbances   Complete by: As directed    Call MD for:  extreme  fatigue   Complete by: As directed    Call MD for:  persistant dizziness or light-headedness   Complete by: As directed    Call MD for:  persistant nausea and vomiting   Complete by: As directed    Call MD for:  severe uncontrolled pain   Complete by: As directed    Call MD for:  temperature >100.4   Complete by: As directed     Discharge instructions   Complete by: As directed    You were cared for by a hospitalist during your hospital stay. If you have any questions about your discharge medications or the care you received while you were in the hospital after you are discharged, you can call the unit and ask to speak with the hospitalist on call if the hospitalist that took care of you is not available. Once you are discharged, your primary care physician will handle any further medical issues. Please note that NO REFILLS for any discharge medications will be authorized once you are discharged, as it is imperative that you return to your primary care physician (or establish a relationship with a primary care physician if you do not have one) for your aftercare needs so that they can reassess your need for medications and monitor your lab values.   Increase activity slowly   Complete by: As directed    No wound care   Complete by: As directed      Allergies as of 02/18/2021      Reactions   Amlodipine Besylate Other (See Comments)   Other reaction(s): edema   Codeine Nausea And Vomiting   Oxycodone-acetaminophen Nausea And Vomiting      Medication List    TAKE these medications   ALPRAZolam 0.5 MG tablet Commonly known as: XANAX Take 0.5 mg by mouth 2 (two) times daily as needed for anxiety.   amoxicillin-clavulanate 875-125 MG tablet Commonly known as: Augmentin Take 1 tablet by mouth 2 (two) times daily for 2 days.   aspirin EC 81 MG tablet Take 81 mg by mouth daily. Swallow whole.   atorvastatin 20 MG tablet Commonly known as: LIPITOR Take 20 mg by mouth at bedtime.   buPROPion 300 MG 24 hr tablet Commonly known as: WELLBUTRIN XL Take 300 mg by mouth daily.   busPIRone 30 MG tablet Commonly known as: BUSPAR Take 30 mg by mouth 2 (two) times daily.   cholecalciferol 25 MCG (1000 UNIT) tablet Commonly known as: VITAMIN D3 Take 1,000 Units by mouth daily.   donepezil 10 MG tablet Commonly known  as: ARICEPT Take 10 mg by mouth at bedtime.   Fluticasone-Umeclidin-Vilant 100-62.5-25 MCG/INH Aepb Inhale 1 puff into the lungs daily. Trelegy   furosemide 20 MG tablet Commonly known as: LASIX Take 20 mg by mouth daily.   ipratropium-albuterol 0.5-2.5 (3) MG/3ML Soln Commonly known as: DUONEB Inhale 3 mLs into the lungs every 4 (four) hours as needed (respiratory issues.).   levothyroxine 137 MCG tablet Commonly known as: SYNTHROID Take 137 mcg by mouth daily before breakfast.   metoprolol succinate 50 MG 24 hr tablet Commonly known as: TOPROL-XL Take 50 mg by mouth daily. Take with or immediately following a meal.   MUCINEX DM MAXIMUM STRENGTH PO Take 15 mLs by mouth every 4 (four) hours as needed (cough/cold symptoms).   ondansetron 8 MG tablet Commonly known as: ZOFRAN Take 8 mg by mouth every 8 (eight) hours as needed for nausea or vomiting.   pantoprazole 40 MG tablet Commonly  known as: PROTONIX Take 1 tablet (40 mg total) by mouth 2 (two) times daily.   predniSONE 10 MG tablet Commonly known as: DELTASONE Take 20 mg by mouth daily with breakfast.   traMADol 50 MG tablet Commonly known as: Ultram Take 1 tablet (50 mg total) by mouth every 6 (six) hours as needed for severe pain.   Ventolin HFA 108 (90 Base) MCG/ACT inhaler Generic drug: albuterol INHALE 2 PUFFS INTO THE LUNGS EVERY 4 HOURS AS NEEDED FOR WHEEZING ORSHORTNESS OF BREATH What changed: See the new instructions.            Durable Medical Equipment  (From admission, onward)         Start     Ordered   02/18/21 0811  For home use only DME Bedside commode  Once       Question:  Patient needs a bedside commode to treat with the following condition  Answer:  Weakness   02/18/21 0810          Follow-up Information    Brahmbhatt, Parag, MD. Schedule an appointment as soon as possible for a visit in 2 month(s).   Specialty: Gastroenterology Why: Follow-up for gastric ulcers Contact  information: [place] [place] [address] [phone]        Harlan Stains, MD. Schedule an appointment as soon as possible for a visit in 1 week(s).   Specialty: Family Medicine Contact information: [address] [place] [address] [phone]              Allergies  Allergen Reactions  . Amlodipine Besylate Other (See Comments)    Other reaction(s): edema  . Codeine Nausea And Vomiting  . Oxycodone-Acetaminophen Nausea And Vomiting    Consultations: GI    Procedures/Studies: No results found.   EGD 03-17-2023  Findings:      LA Grade A (one or more mucosal breaks less than 5 mm, not extending       between tops of 2 mucosal folds) esophagitis with no bleeding was found       in the distal esophagus.      A small hiatal hernia was present.      Many, small, non-bleeding linear and superficial gastric ulcers with no       stigmata of bleeding were found in the gastric body and in the gastric       antrum. Biopsies were taken with a cold forceps for histology.      The cardia and gastric fundus were normal on retroflexion.      Scattered mild inflammation characterized by congestion (edema) and       erythema was found in the duodenal bulb.      The first portion of the duodenum and second portion of the duodenum       were normal. Impression:               - LA Grade A reflux esophagitis with no bleeding.                           - Small hiatal hernia.                           - Non-bleeding gastric ulcers with no stigmata of  bleeding. Biopsied.                           - Duodenitis.                           - Normal first portion of the duodenum and second                            portion of the duodenum. Recommendation:           - Return patient to hospital ward for ongoing care.                           - Soft diet.                           - Continue present medications.                            - Await pathology results.                           - Repeat upper endoscopy in 3 months to check                            healing.                           - Return to my office in 2 months.                           - Use Protonix (pantoprazole) 40 mg PO BID for 2                            months.   Discharge Exam: Vitals:   02/18/21 0041 02/18/21 0311  BP: (!) 129/49 140/65  Pulse: 97 93  Resp: 16 17  Temp: 98.3 F (36.8 C)   SpO2: 100% 97%    General: Pt is alert, awake, not in acute distress Cardiovascular: RRR, S1/S2 +, no edema Respiratory: CTA bilaterally, no wheezing, no rhonchi, no respiratory distress, no conversational dyspnea  Abdominal: Soft, NT, ND, bowel sounds + Extremities: no edema, no cyanosis Psych: Normal mood and affect, stable judgement and insight     The results of significant diagnostics from this hospitalization (including imaging, microbiology, ancillary and laboratory) are listed below for reference.     Microbiology: Recent Results (from the past 240 hour(s))  MRSA PCR Screening     Status: None   Collection Time: 02/16/21  3:05 PM   Specimen: Nasopharyngeal  Result Value Ref Range Status   MRSA by PCR NEGATIVE NEGATIVE Final    Comment:        The GeneXpert MRSA Assay (FDA approved for NASAL specimens only), is one component of a comprehensive MRSA colonization surveillance program. It is not intended to diagnose MRSA infection nor to guide or monitor treatment for MRSA infections. Performed at [facility], [place] [address] [place], [place] [address]      Labs: BNP (last 3 results) No results for  input(s): BNP in the last 8760 hours. Basic Metabolic Panel: Recent Labs  Lab 02/16/21 0245 02/17/21 0120 02/18/21 0540  NA 144 145 143  K 3.1* 4.0 3.7  CL 101 106 104  CO2 35* 34* 33*  GLUCOSE 138* 106* 83  BUN 29* 23 19  CREATININE 1.26* 1.01* 1.10*  CALCIUM 8.2* 8.8* 9.3  MG  --  1.6*  --   PHOS  --   2.8  --    Liver Function Tests: Recent Labs  Lab 02/16/21 0245 02/17/21 0120 02/18/21 0540  AST 28 22 23   ALT 17 15 15   ALKPHOS 45 47 57  BILITOT 0.6 0.4 0.7  PROT 3.9* 4.3* 4.7*  ALBUMIN 1.8* 2.0* 2.2*   No results for input(s): LIPASE, AMYLASE in the last 168 hours. No results for input(s): AMMONIA in the last 168 hours. CBC: Recent Labs  Lab 02/16/21 0245 02/17/21 0120 02/17/21 1635 02/18/21 0540  WBC 8.4 7.4 7.5 6.3  NEUTROABS 5.9  --   --   --   HGB 5.2* 9.5* 9.7* 9.7*  HCT 17.1* 29.3* 30.8* 31.7*  MCV 101.2* 92.7 93.9 96.6  PLT 148* 120* 125* 121*   Cardiac Enzymes: No results for input(s): CKTOTAL, CKMB, CKMBINDEX, TROPONINI in the last 168 hours. BNP: Invalid input(s): POCBNP CBG: No results for input(s): GLUCAP in the last 168 hours. D-Dimer No results for input(s): DDIMER in the last 72 hours. Hgb A1c No results for input(s): HGBA1C in the last 72 hours. Lipid Profile No results for input(s): CHOL, HDL, LDLCALC, TRIG, CHOLHDL, LDLDIRECT in the last 72 hours. Thyroid function studies No results for input(s): TSH, T4TOTAL, T3FREE, THYROIDAB in the last 72 hours.  Invalid input(s): FREET3 Anemia work up No results for input(s): VITAMINB12, FOLATE, FERRITIN, TIBC, IRON, RETICCTPCT in the last 72 hours. Urinalysis No results found for: COLORURINE, APPEARANCEUR, [place], [place], [place], [place], [place], [place], PROTEINUR, UROBILINOGEN, NITRITE, LEUKOCYTESUR Sepsis Labs Invalid input(s): PROCALCITONIN,  WBC,  LACTICIDVEN Microbiology Recent Results (from the past 240 hour(s))  MRSA PCR Screening     Status: None   Collection Time: 02/16/21  3:05 PM   Specimen: Nasopharyngeal  Result Value Ref Range Status   MRSA by PCR NEGATIVE NEGATIVE Final    Comment:        The GeneXpert MRSA Assay (FDA approved for NASAL specimens only), is one component of a comprehensive MRSA colonization surveillance program. It is not intended to diagnose  MRSA infection nor to guide or monitor treatment for MRSA infections. Performed at [facility], [place] [address], [place], [place] [address]      Patient was seen and examined on the day of discharge and was found to be in stable condition. Time coordinating discharge: 25 minutes including assessment and coordination of care, as well as examination of the patient.   SIGNED:  Dessa Phi, DO [facility] 02/18/2021, 8:19 AM

## 2021-02-18 NOTE — Progress Notes (Signed)
Patient being discharged home with home health.  IV's removed with the catheters intact.  Discharge instructions and prescription information given to the patient who verbalized understanding. Patient to be transported by her daughter.

## 2021-02-22 ENCOUNTER — Telehealth: Payer: Self-pay | Admitting: *Deleted

## 2021-02-22 ENCOUNTER — Other Ambulatory Visit: Payer: Self-pay

## 2021-02-22 ENCOUNTER — Emergency Department (HOSPITAL_BASED_OUTPATIENT_CLINIC_OR_DEPARTMENT_OTHER): Payer: Medicare Other

## 2021-02-22 ENCOUNTER — Encounter (HOSPITAL_BASED_OUTPATIENT_CLINIC_OR_DEPARTMENT_OTHER): Payer: Self-pay | Admitting: Emergency Medicine

## 2021-02-22 ENCOUNTER — Ambulatory Visit: Admission: RE | Admit: 2021-02-22 | Payer: Medicare Other | Source: Ambulatory Visit | Admitting: Radiation Oncology

## 2021-02-22 ENCOUNTER — Inpatient Hospital Stay (HOSPITAL_BASED_OUTPATIENT_CLINIC_OR_DEPARTMENT_OTHER)
Admission: EM | Admit: 2021-02-22 | Discharge: 2021-02-25 | DRG: 291 | Disposition: A | Discharge status: Home Health | Payer: Medicare Other | Attending: Internal Medicine | Admitting: Internal Medicine

## 2021-02-22 DIAGNOSIS — E782 Mixed hyperlipidemia: Secondary | ICD-10-CM | POA: Diagnosis present

## 2021-02-22 DIAGNOSIS — I130 Hypertensive heart and chronic kidney disease with heart failure and stage 1 through stage 4 chronic kidney disease, or unspecified chronic kidney disease: Secondary | ICD-10-CM | POA: Diagnosis not present

## 2021-02-22 DIAGNOSIS — R609 Edema, unspecified: Secondary | ICD-10-CM | POA: Diagnosis not present

## 2021-02-22 DIAGNOSIS — D696 Thrombocytopenia, unspecified: Secondary | ICD-10-CM | POA: Diagnosis present

## 2021-02-22 DIAGNOSIS — Z8249 Family history of ischemic heart disease and other diseases of the circulatory system: Secondary | ICD-10-CM

## 2021-02-22 DIAGNOSIS — Z79899 Other long term (current) drug therapy: Secondary | ICD-10-CM

## 2021-02-22 DIAGNOSIS — Z7982 Long term (current) use of aspirin: Secondary | ICD-10-CM

## 2021-02-22 DIAGNOSIS — Z801 Family history of malignant neoplasm of trachea, bronchus and lung: Secondary | ICD-10-CM

## 2021-02-22 DIAGNOSIS — Z79890 Hormone replacement therapy (postmenopausal): Secondary | ICD-10-CM

## 2021-02-22 DIAGNOSIS — K254 Chronic or unspecified gastric ulcer with hemorrhage: Secondary | ICD-10-CM | POA: Diagnosis not present

## 2021-02-22 DIAGNOSIS — Z87891 Personal history of nicotine dependence: Secondary | ICD-10-CM

## 2021-02-22 DIAGNOSIS — E876 Hypokalemia: Secondary | ICD-10-CM | POA: Diagnosis present

## 2021-02-22 DIAGNOSIS — I959 Hypotension, unspecified: Principal | ICD-10-CM | POA: Diagnosis present

## 2021-02-22 DIAGNOSIS — Z7951 Long term (current) use of inhaled steroids: Secondary | ICD-10-CM

## 2021-02-22 DIAGNOSIS — Z7952 Long term (current) use of systemic steroids: Secondary | ICD-10-CM

## 2021-02-22 DIAGNOSIS — R0602 Shortness of breath: Secondary | ICD-10-CM | POA: Diagnosis not present

## 2021-02-22 DIAGNOSIS — J9611 Chronic respiratory failure with hypoxia: Secondary | ICD-10-CM | POA: Diagnosis not present

## 2021-02-22 DIAGNOSIS — J449 Chronic obstructive pulmonary disease, unspecified: Secondary | ICD-10-CM | POA: Diagnosis present

## 2021-02-22 DIAGNOSIS — R7989 Other specified abnormal findings of blood chemistry: Secondary | ICD-10-CM | POA: Diagnosis not present

## 2021-02-22 DIAGNOSIS — Z20822 Contact with and (suspected) exposure to covid-19: Secondary | ICD-10-CM | POA: Diagnosis present

## 2021-02-22 DIAGNOSIS — Z825 Family history of asthma and other chronic lower respiratory diseases: Secondary | ICD-10-CM

## 2021-02-22 DIAGNOSIS — F419 Anxiety disorder, unspecified: Secondary | ICD-10-CM | POA: Diagnosis present

## 2021-02-22 DIAGNOSIS — N179 Acute kidney failure, unspecified: Secondary | ICD-10-CM | POA: Diagnosis present

## 2021-02-22 DIAGNOSIS — Z85820 Personal history of malignant melanoma of skin: Secondary | ICD-10-CM

## 2021-02-22 DIAGNOSIS — I5033 Acute on chronic diastolic (congestive) heart failure: Secondary | ICD-10-CM | POA: Diagnosis present

## 2021-02-22 DIAGNOSIS — I878 Other specified disorders of veins: Secondary | ICD-10-CM | POA: Diagnosis present

## 2021-02-22 DIAGNOSIS — J441 Chronic obstructive pulmonary disease with (acute) exacerbation: Secondary | ICD-10-CM | POA: Diagnosis present

## 2021-02-22 DIAGNOSIS — R600 Localized edema: Secondary | ICD-10-CM | POA: Diagnosis not present

## 2021-02-22 DIAGNOSIS — R5383 Other fatigue: Secondary | ICD-10-CM | POA: Diagnosis not present

## 2021-02-22 DIAGNOSIS — N1831 Chronic kidney disease, stage 3a: Secondary | ICD-10-CM | POA: Diagnosis present

## 2021-02-22 DIAGNOSIS — L89312 Pressure ulcer of right buttock, stage 2: Secondary | ICD-10-CM | POA: Diagnosis present

## 2021-02-22 LAB — CBC WITH DIFFERENTIAL/PLATELET
Abs Immature Granulocytes: 0.04 10*3/uL (ref 0.00–0.07)
Basophils Absolute: 0 10*3/uL (ref 0.0–0.1)
Basophils Relative: 0 %
Eosinophils Absolute: 0.2 10*3/uL (ref 0.0–0.5)
Eosinophils Relative: 2 %
HCT: 36.7 % (ref 36.0–46.0)
Hemoglobin: 11 g/dL — ABNORMAL LOW (ref 12.0–15.0)
Immature Granulocytes: 0 %
Lymphocytes Relative: 27 %
Lymphs Abs: 2.9 10*3/uL (ref 0.7–4.0)
MCH: 29.6 pg (ref 26.0–34.0)
MCHC: 30 g/dL (ref 30.0–36.0)
MCV: 98.9 fL (ref 80.0–100.0)
Monocytes Absolute: 1.2 10*3/uL — ABNORMAL HIGH (ref 0.1–1.0)
Monocytes Relative: 11 %
Neutro Abs: 6.3 10*3/uL (ref 1.7–7.7)
Neutrophils Relative %: 60 %
Platelets: 193 10*3/uL (ref 150–400)
RBC: 3.71 MIL/uL — ABNORMAL LOW (ref 3.87–5.11)
RDW: 18 % — ABNORMAL HIGH (ref 11.5–15.5)
WBC: 10.6 10*3/uL — ABNORMAL HIGH (ref 4.0–10.5)
nRBC: 0 % (ref 0.0–0.2)

## 2021-02-22 LAB — COMPREHENSIVE METABOLIC PANEL
ALT: 13 U/L (ref 0–44)
AST: 16 U/L (ref 15–41)
Albumin: 3.3 g/dL — ABNORMAL LOW (ref 3.5–5.0)
Alkaline Phosphatase: 73 U/L (ref 38–126)
Anion gap: 7 (ref 5–15)
BUN: 16 mg/dL (ref 8–23)
CO2: 44 mmol/L — ABNORMAL HIGH (ref 22–32)
Calcium: 9.7 mg/dL (ref 8.9–10.3)
Chloride: 93 mmol/L — ABNORMAL LOW (ref 98–111)
Creatinine, Ser: 1.42 mg/dL — ABNORMAL HIGH (ref 0.44–1.00)
GFR, Estimated: 38 mL/min — ABNORMAL LOW (ref >60)
Glucose, Bld: 103 mg/dL — ABNORMAL HIGH (ref 70–99)
Potassium: 3 mmol/L — ABNORMAL LOW (ref 3.5–5.1)
Sodium: 144 mmol/L (ref 135–145)
Total Bilirubin: 0.5 mg/dL (ref 0.3–1.2)
Total Protein: 5.9 g/dL — ABNORMAL LOW (ref 6.5–8.1)

## 2021-02-22 LAB — D-DIMER, QUANTITATIVE: D-Dimer, Quant: 0.89 ug/mL-FEU — ABNORMAL HIGH (ref 0.00–0.50)

## 2021-02-22 LAB — TROPONIN I (HIGH SENSITIVITY)
Troponin I (High Sensitivity): 6 ng/L (ref <18)
Troponin I (High Sensitivity): 6 ng/L (ref <18)

## 2021-02-22 LAB — BRAIN NATRIURETIC PEPTIDE: B Natriuretic Peptide: 48.5 pg/mL (ref 0.0–100.0)

## 2021-02-22 LAB — RESP PANEL BY RT-PCR (FLU A&B, COVID) ARPGX2
Influenza A by PCR: NEGATIVE
Influenza B by PCR: NEGATIVE
SARS Coronavirus 2 by RT PCR: NEGATIVE

## 2021-02-22 LAB — CBG MONITORING, ED: Glucose-Capillary: 104 mg/dL — ABNORMAL HIGH (ref 70–99)

## 2021-02-22 IMAGING — DX DG CHEST 1V PORT
1 series · 1 of 1 positions shown · non-contrast
Comparison: [DATE]

CLINICAL DATA: Lethargy x3 days.

EXAM:
PORTABLE CHEST 1 VIEW

[chest]
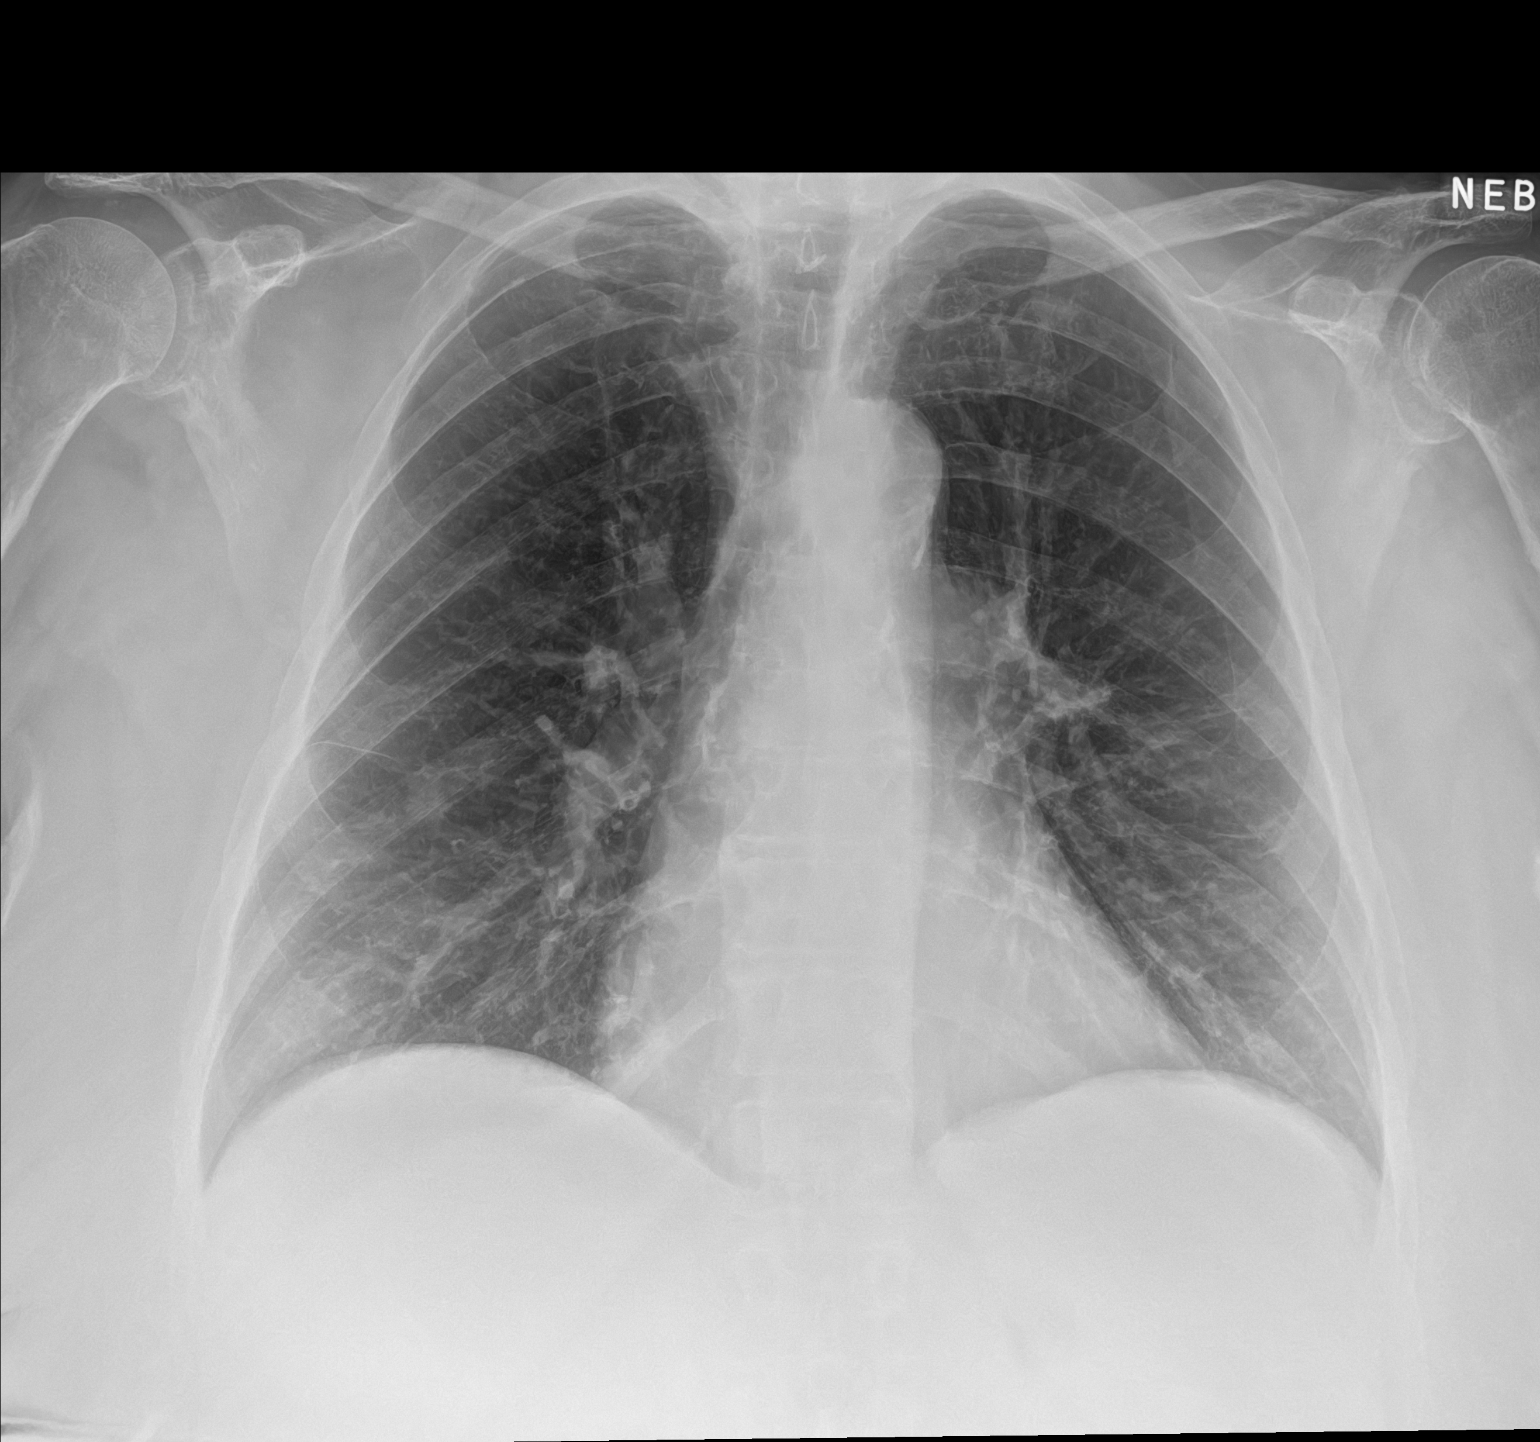

[1 of 1 positions shown; findings below may reference images not displayed]

FINDINGS: The lungs are mildly hyperinflated. Chronic appearing increased lung
markings are seen within the bilateral lower lobes. There is no
evidence of acute infiltrate, pleural effusion or pneumothorax. The
heart size and mediastinal contours are within normal limits. The
visualized skeletal structures are unremarkable.
IMPRESSION: No acute or active cardiopulmonary disease.

## 2021-02-22 IMAGING — CT CT ANGIO CHEST
2 of 7 series · 17 of 46 positions shown · IV contrast (omnipaque)
Comparison: Radiograph earlier this day.  Chest CT [DATE]

CLINICAL DATA: PE suspected, low/intermediate prob, positive
D-dimer

Shortness of breath. Radiologic records states history of lung
cancer.
EXAM:
CT ANGIOGRAPHY CHEST WITH CONTRAST
TECHNIQUE: Multidetector CT imaging of the chest was performed using the
standard protocol during bolus administration of intravenous
contrast. Multiplanar CT image reconstructions and MIPs were
obtained to evaluate the vascular anatomy.
CONTRAST:  60mL OMNIPAQUE IOHEXOL 350 MG/ML SOLN

[Series 5: pe axial thins · axial · 0.73mm/px · z∈[-628,-406]mm · 14 of 258 slices shown]
[im 18/258  lung]
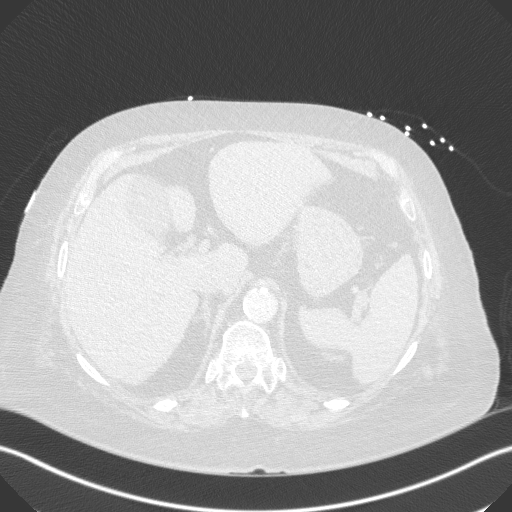
[im 35/258  soft-tissue]
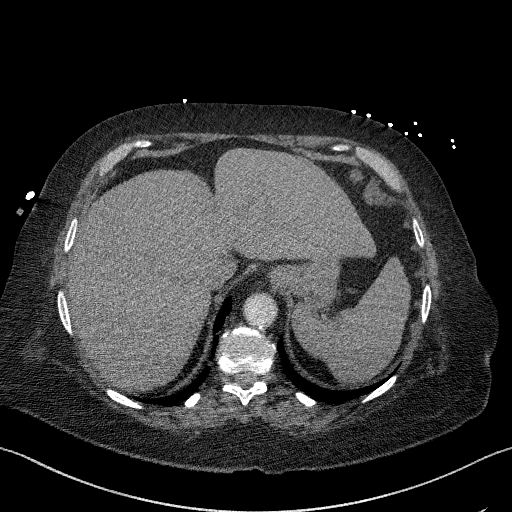
[im 52/258  lung]
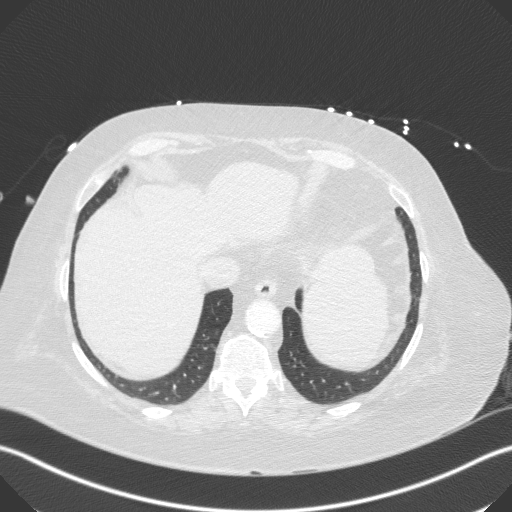
[im 69/258  soft-tissue]
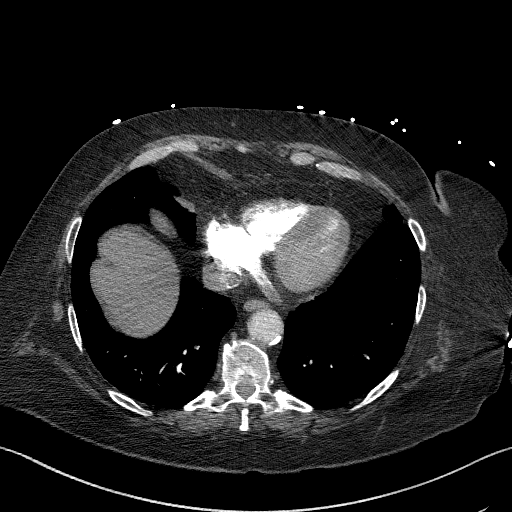
[im 86/258  lung]
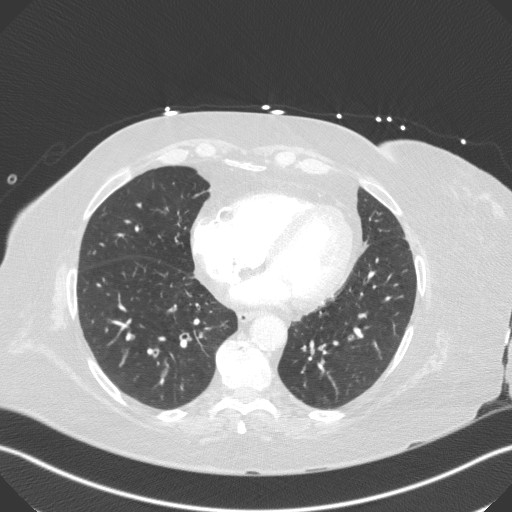
[im 103/258  soft-tissue]
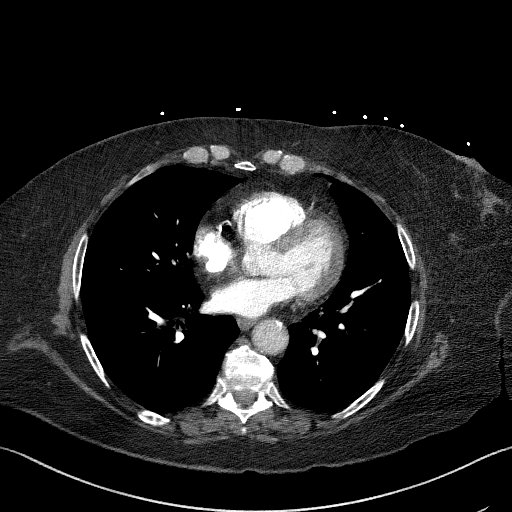
[im 120/258  lung]
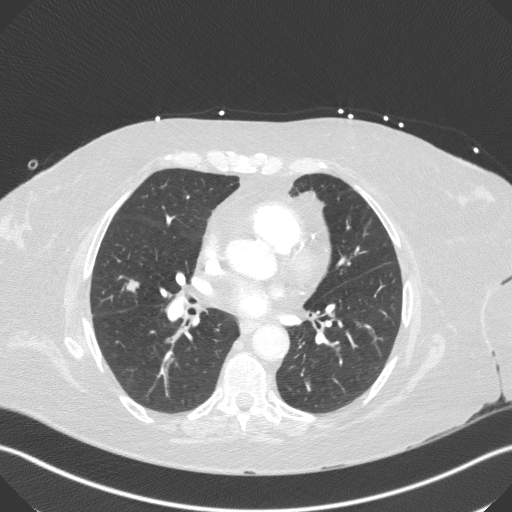
[im 138/258  soft-tissue]
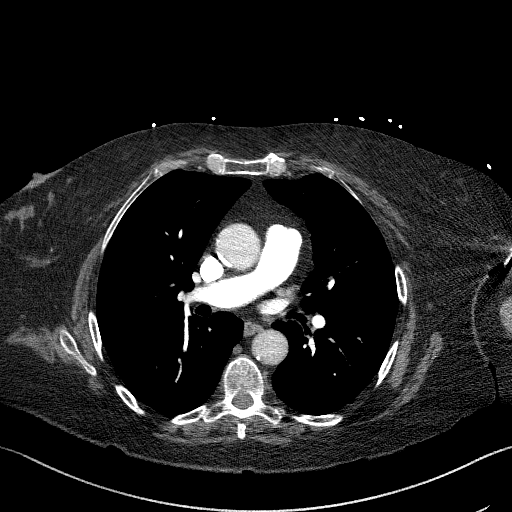
[im 155/258  lung]
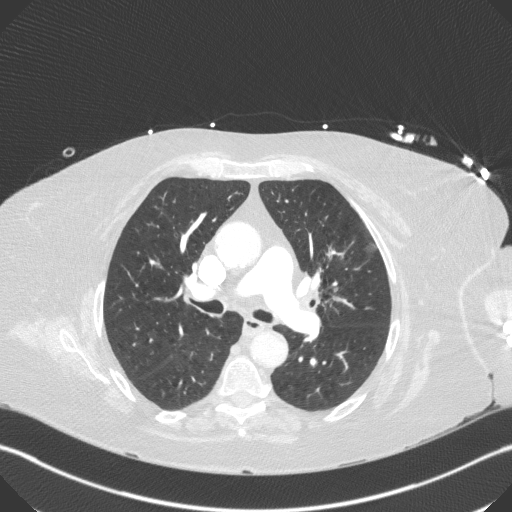
[im 172/258  soft-tissue]
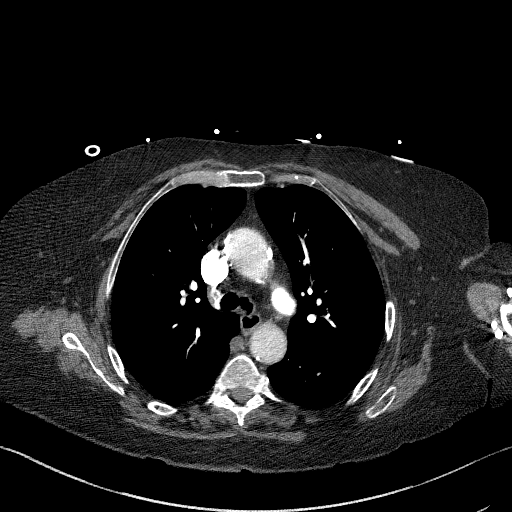
[im 189/258  lung]
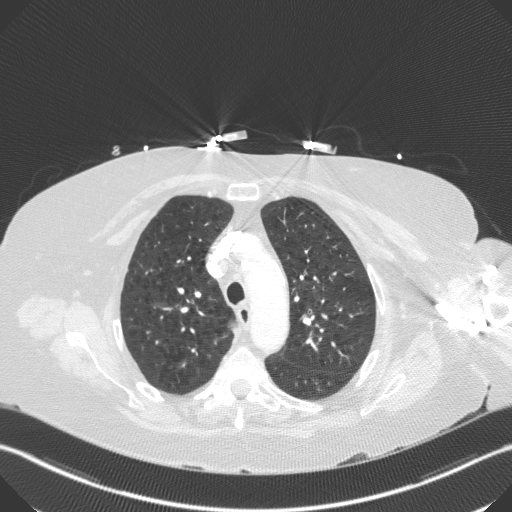
[im 206/258  soft-tissue]
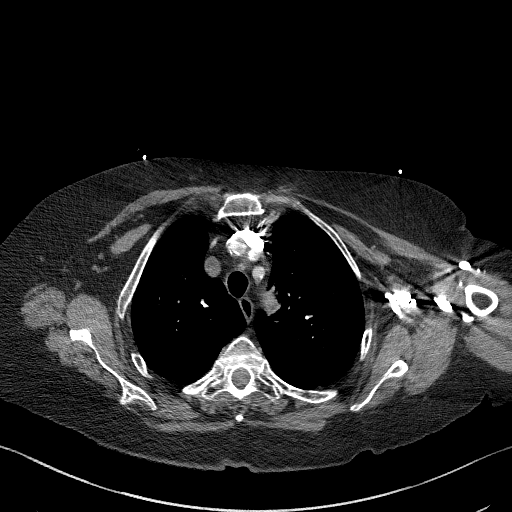
[im 223/258  lung]
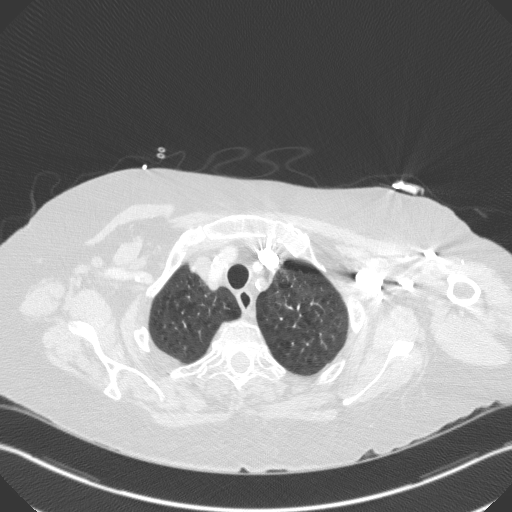
[im 240/258  soft-tissue]
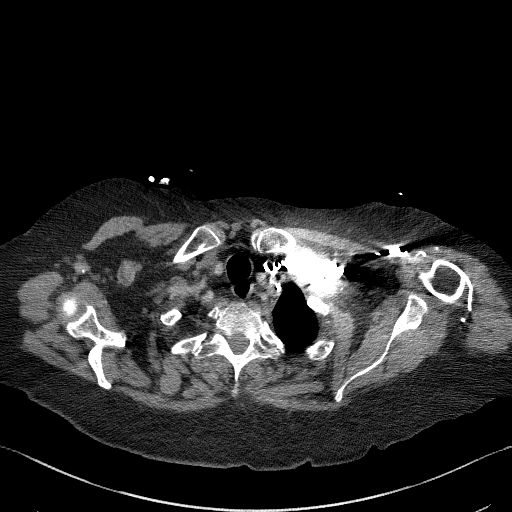

[Series 7: cor soft · coronal · 0.54mm/px · 3 of 110 slices shown]
[im 28/110  soft-tissue]
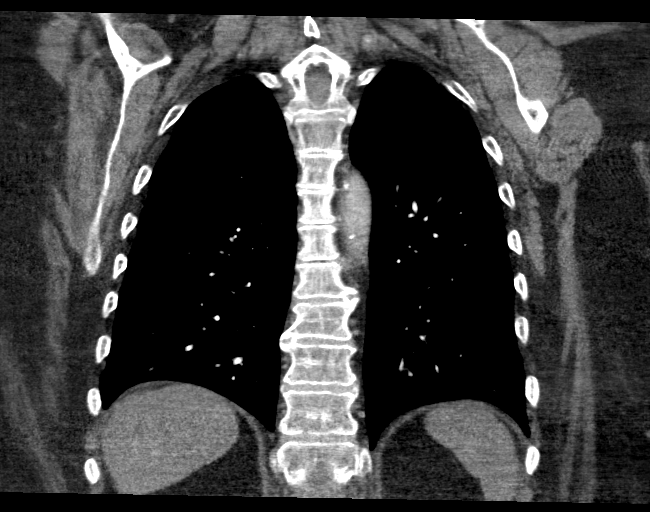
[im 55/110  soft-tissue]
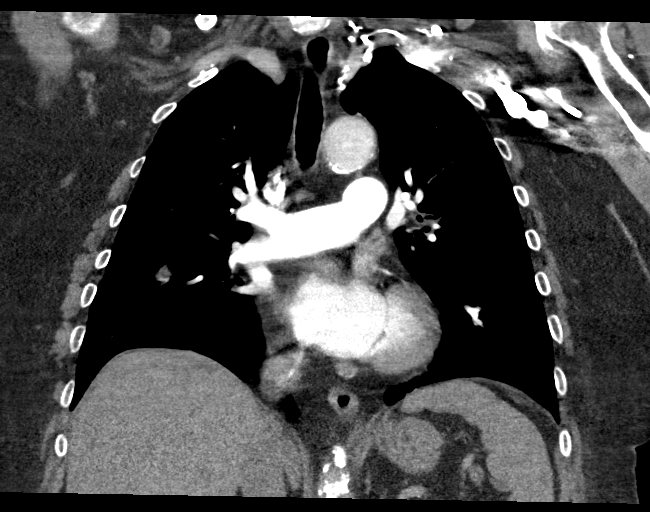
[im 82/110  soft-tissue]
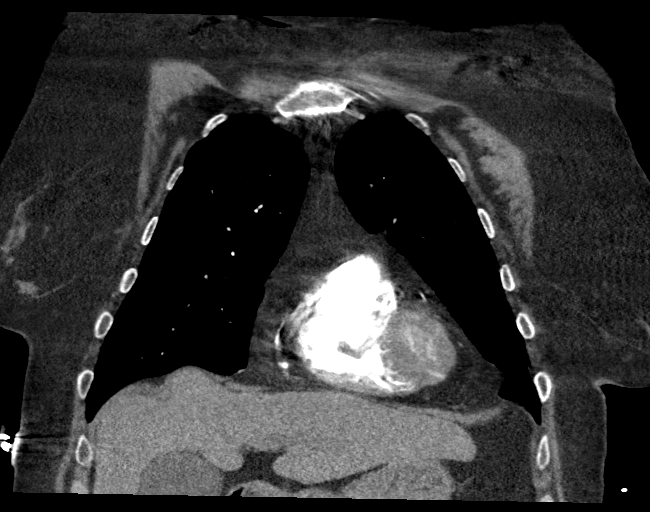

[17 of 46 positions shown; findings below may reference images not displayed]

FINDINGS: Cardiovascular: There are no filling defects within the pulmonary
arteries to suggest pulmonary embolus. Atherosclerosis of the
thoracic aorta. No evidence of dissection or acute aortic findings.
Normal heart size with coronary artery calcifications.

Mediastinum/Nodes: No enlarged mediastinal or hilar lymph nodes.
Esophagus minimally patulous but no wall thickening. Thyroidectomy.

Lungs/Pleura: Moderate emphysema. Post treatment scarring and
architectural distortion of the paramediastinal anterior left upper
lobe. This is not significantly changed in appearance from prior
exam. Spiculated right middle lobe nodule measures 10 x 9 mm, series
6, image 71, not significantly changed in the interim. Bronchial
thickening with occasional areas of mucous plugging in the lower
lobes. No new pulmonary nodule or acute airspace disease. No pleural
fluid.

Upper Abdomen: No acute or unexpected findings.

Musculoskeletal: No acute osseous abnormality or focal osseous
lesion. Mild thoracic spondylosis. No chest wall soft tissue
abnormality.

Review of the MIP images confirms the above findings.
IMPRESSION: 1. No pulmonary embolus or acute intrathoracic abnormality.
2. Post treatment scarring and architectural distortion in the
paramediastinal anterior left upper lobe, unchanged from prior exam.
3. Spiculated right middle lobe nodule is unchanged in size from
prior exam, 10 x 9 mm.
4. Aortic atherosclerosis.  Coronary artery calcifications.
5. Emphysema.

Aortic Atherosclerosis ([14]-[14]) and Emphysema ([14]-[14]).

## 2021-02-22 IMAGING — US US EXTREM LOW VENOUS*L*
1 series · 13 of 24 positions shown · non-contrast
Comparison: None.

CLINICAL DATA: Lower extremity edema



[Series 1: us venous img lower uni left (dvt) · portal-venous · 13 of 35 slices shown]
[im 1/35]
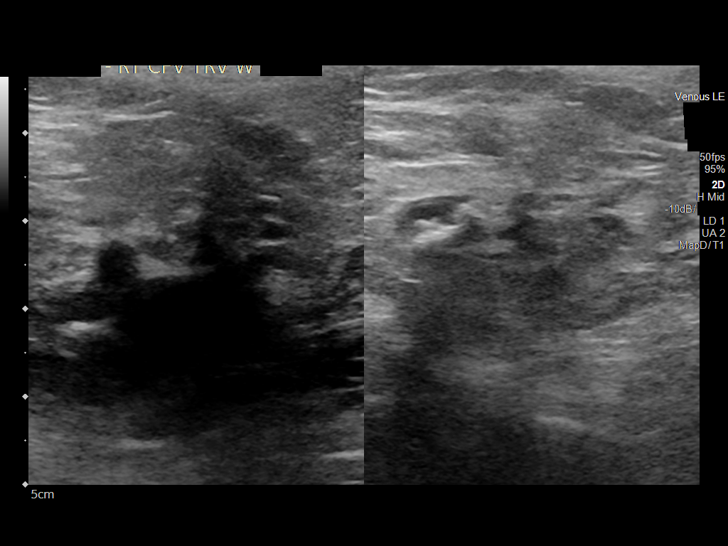
[im 3/35]
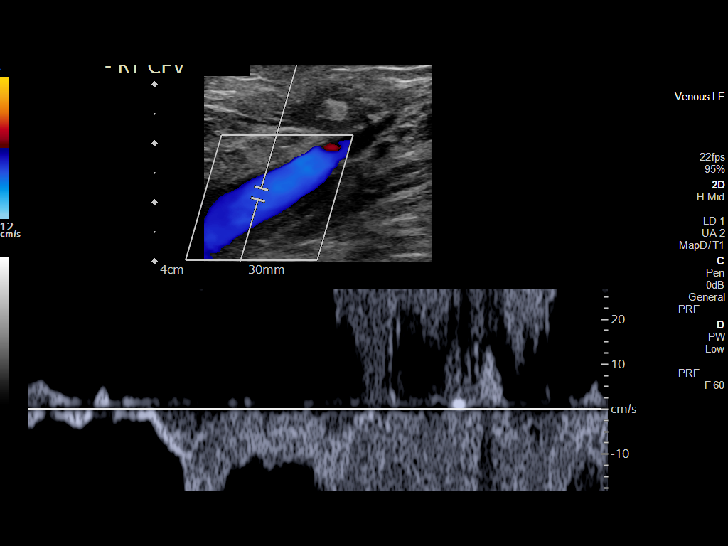
[im 6/35]
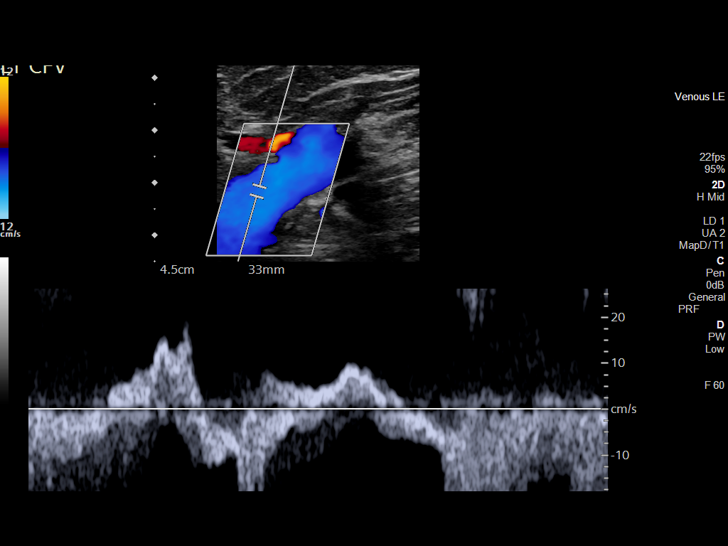
[im 9/35]
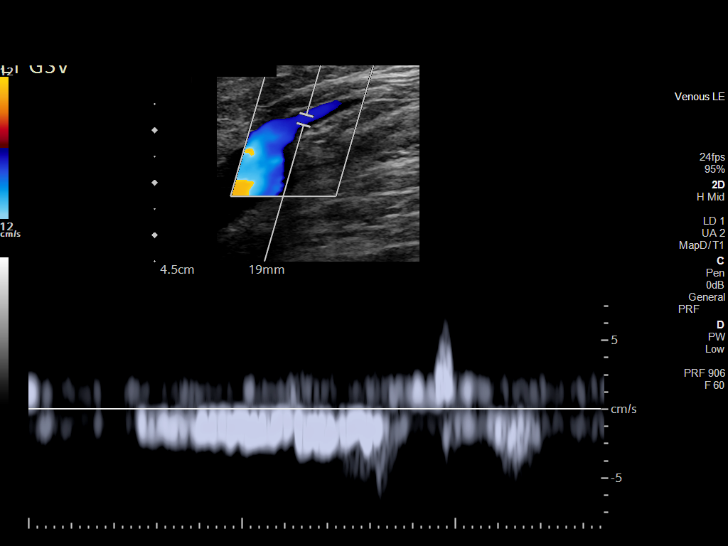
[im 12/35]
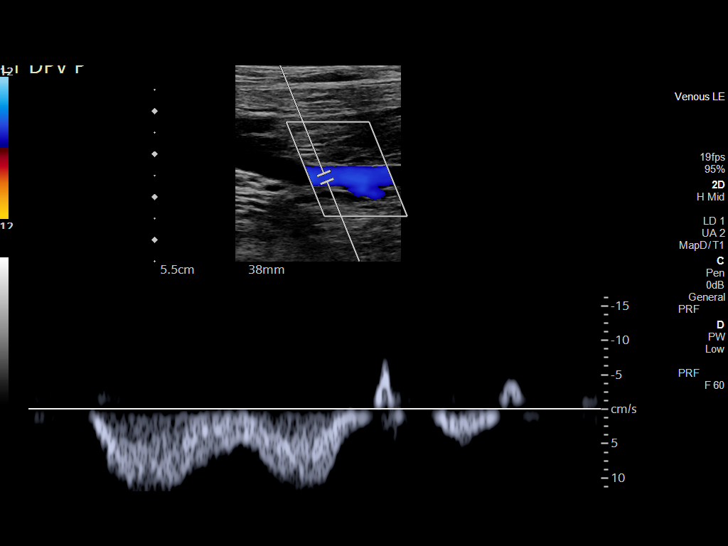
[im 15/35]
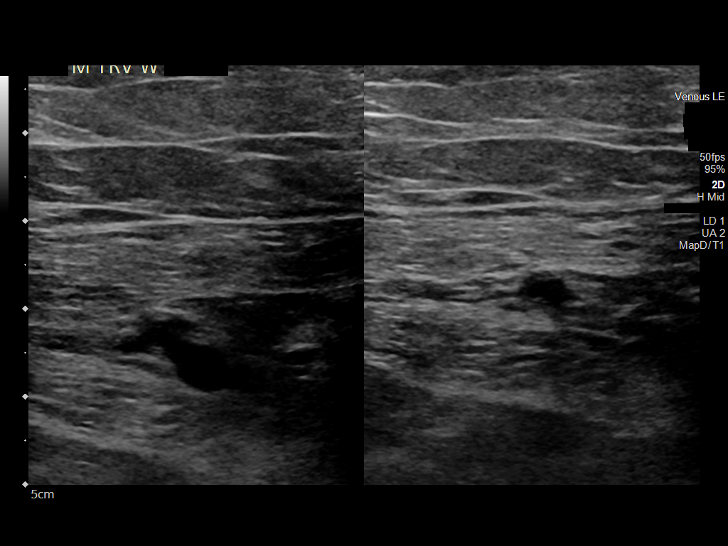
[im 18/35]
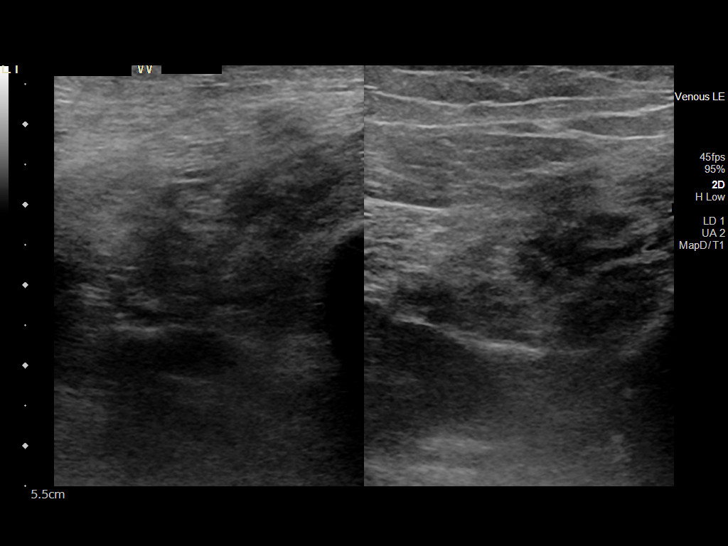
[im 20/35]
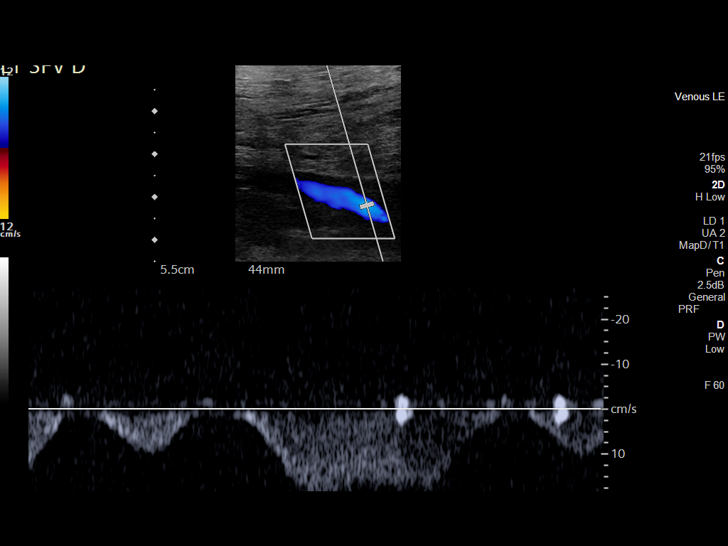
[im 23/35]
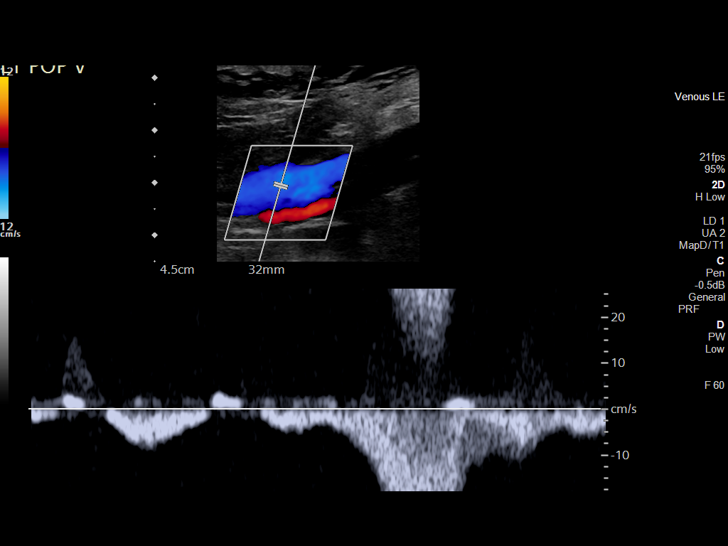
[im 26/35]
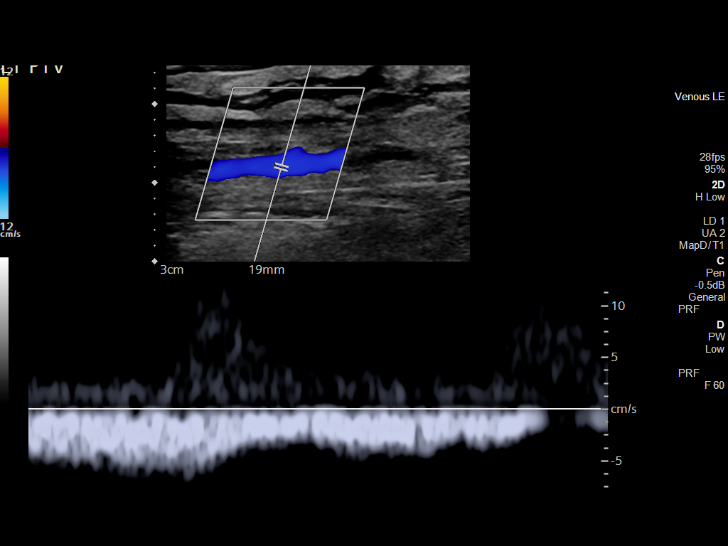
[im 29/35]
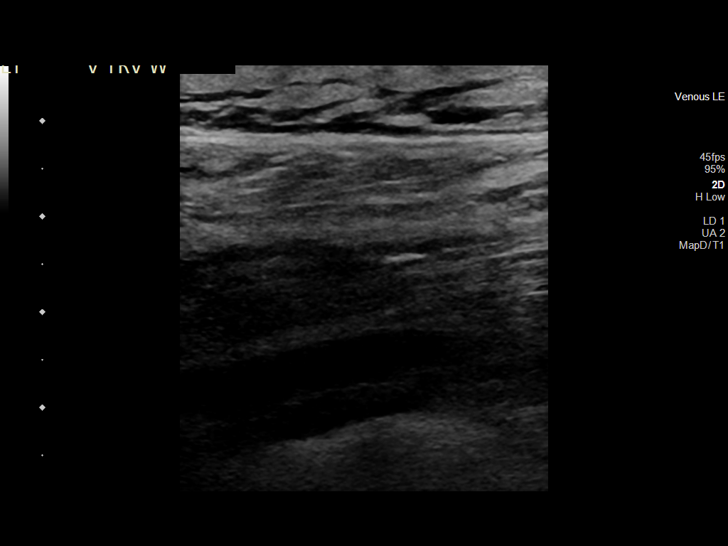
[im 32/35]
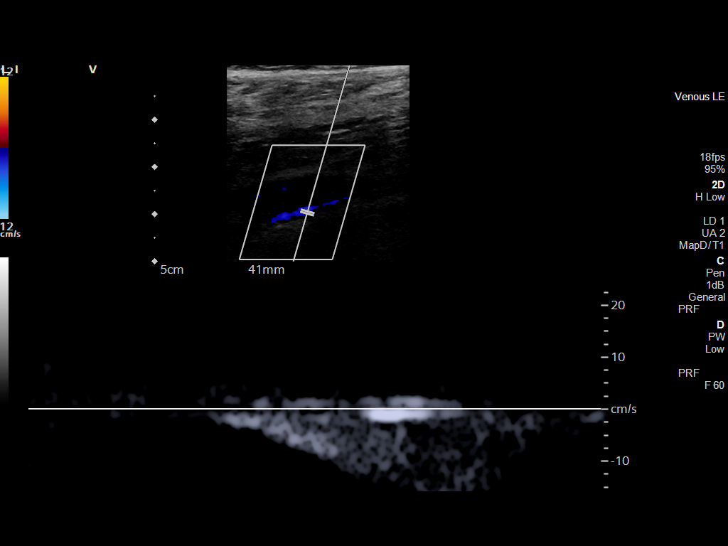
[im 35/35]
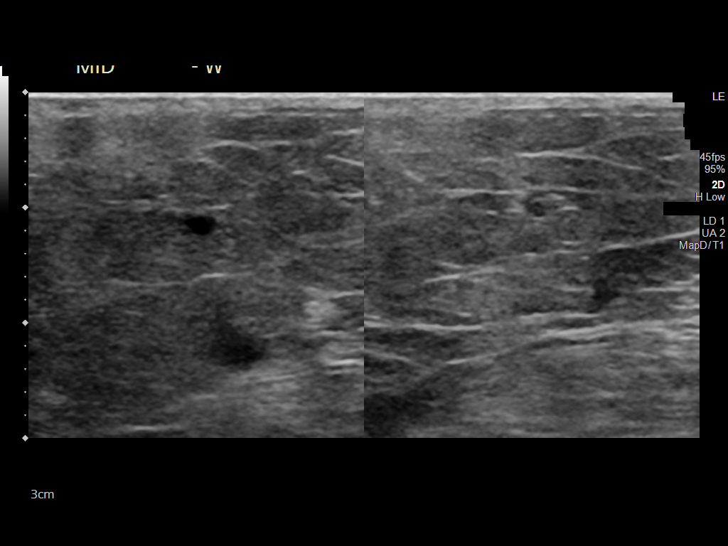

[13 of 24 positions shown; findings below may reference images not displayed]

FINDINGS: Contralateral Common Femoral Vein: Respiratory phasicity is normal
and symmetric with the symptomatic side. No evidence of thrombus.
Normal compressibility.

Common Femoral Vein: No evidence of thrombus. Normal
compressibility, respiratory phasicity and response to augmentation.

Saphenofemoral Junction: No evidence of thrombus. Normal
compressibility and flow on color Doppler imaging.

Profunda Femoral Vein: No evidence of thrombus. Normal
compressibility and flow on color Doppler imaging.

Femoral Vein: No evidence of thrombus. Normal compressibility,
respiratory phasicity and response to augmentation.

Popliteal Vein: No evidence of thrombus. Normal compressibility,
respiratory phasicity and response to augmentation.

Calf Veins: No evidence of thrombus. Normal compressibility and flow
on color Doppler imaging.

Superficial Great Saphenous Vein: No evidence of thrombus. Normal
compressibility.

Venous Reflux:  None.

Other Findings:  Calf edema is noted.
IMPRESSION: No evidence of deep venous thrombosis.

## 2021-02-22 MED ORDER — HYDROCORTISONE NA SUCCINATE PF 100 MG IJ SOLR
100.0000 mg | Freq: Once | INTRAMUSCULAR | Status: AC
  Administered 2021-02-22: 100 mg via INTRAVENOUS
  Filled 2021-02-22: qty 2

## 2021-02-22 MED ORDER — IOHEXOL 350 MG/ML SOLN
100.0000 mL | Freq: Once | INTRAVENOUS | Status: AC | PRN
  Administered 2021-02-22: 60 mL via INTRAVENOUS

## 2021-02-22 MED ORDER — PREDNISONE 20 MG PO TABS
20.0000 mg | ORAL_TABLET | Freq: Once | ORAL | Status: AC
  Administered 2021-02-22: 20 mg via ORAL
  Filled 2021-02-22: qty 1

## 2021-02-22 MED ORDER — SODIUM CHLORIDE 0.9 % IV BOLUS
250.0000 mL | Freq: Once | INTRAVENOUS | Status: AC
  Administered 2021-02-22: 250 mL via INTRAVENOUS

## 2021-02-22 MED ORDER — SODIUM CHLORIDE 0.9 % IV BOLUS
500.0000 mL | Freq: Once | INTRAVENOUS | Status: AC
  Administered 2021-02-22: 500 mL via INTRAVENOUS

## 2021-02-22 MED ORDER — POTASSIUM CHLORIDE CRYS ER 20 MEQ PO TBCR
40.0000 meq | EXTENDED_RELEASE_TABLET | Freq: Once | ORAL | Status: AC
  Administered 2021-02-22: 40 meq via ORAL
  Filled 2021-02-22: qty 2

## 2021-02-22 NOTE — Telephone Encounter (Signed)
CALLED PATIENT TO SEE IF SHE IS READY TO RESCHEDULE CT AND FU, PATIENT STATED THAT IT WOULD BE OK.

## 2021-02-22 NOTE — Telephone Encounter (Signed)
CALLED PATIENT TO INFORM OF CT FOR 03-03-21- ARRIVAL TIME- 12:15 PM @ [facility], PATIENT TO HAVE WATER ONLY - 4 HRS. PRIOR TO TEST, PATIENT TO RECEIVE RESULTS FROM ALISON PERKINS ON 03-08-21 @ 1:30 PM VIA TELEPHONE, SPOKE WITH PATIENT AND SHE IS AWARE OF THESE APPTS.

## 2021-02-22 NOTE — ED Triage Notes (Signed)
Pt reports lethargy x 3 days , Hx anemia and blood transfusion. Recent hospital admission for GI bleed.

## 2021-02-22 NOTE — ED Provider Notes (Signed)
[facility] EMERGENCY DEPT Provider Note   CSN: [id number] Arrival date & time: 02/22/21  1656     History Chief Complaint  Patient presents with  . Fatigue    Holly [name] is a 75 y.o. female.  The history is provided by the patient and medical records.   Holly [name] is a 75 y.o. female who presents to the Emergency Department complaining of fatigue. She presents to the emergency department upon referral from her PCP, accompanied by her daughter for evaluation of fatigue and generalized weakness. She was just discharged from the hospital following admission for G.I. bleed requiring multiple blood transfusions. Since hospital discharge she has experienced progressive generalized weakness and fatigue. She has chronic shortness of breath and this is at her baseline. She also has chronic lower extremity edema and this is also at her baseline. She is now having brown stools, was previously having black stools. No fevers, chest pain, abdominal pain. She has been compliant with her home medications. She is on oxygen at baseline at home. At her PCPs office she was found to be hypotensive and therefore referred to the emergency department.    Past Medical History:  Diagnosis Date  . Anxiety   . Asthma   . Basal cell carcinoma    lung cancer.  Skin cancer- basal - nose removed  . Bruises easily   . Cataract   . Cervicalgia   . 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 Dyspnea    uses O2 only when needed. Uses 2 L   . Foot swelling    left- has shown to Drs.  . Frequency of urination   . Headache   . 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 Hypercalcemia   . Hypertension   . Hypothyroidism   . Mixed hyperlipidemia   . Pneumonia   . Pulmonary nodule   . Sleep apnea    does not wear cpap  . Thyroid neoplasm   . Urinary incontinence   . Wears dentures   . Wears glasses     Patient Active Problem List   Diagnosis Date  Noted  . Acute renal failure superimposed on stage 3a chronic kidney disease ([place]) 02/22/2021  . GI bleed 02/16/2021  . ABLA (acute blood loss anemia) 02/16/2021  . Diverticulitis 02/16/2021  . Current chronic use of systemic steroids 02/16/2021  . Pressure injury of skin 02/16/2021  . Olecranon bursitis of right elbow 10/02/2020  . Ulna, olecranon process fracture, right, closed, with routine healing, subsequent encounter 10/01/2020  . Primary malignant neoplasm of left upper lobe of lung ([place]) 07/09/2019  . Closed fracture of right olecranon process 12/04/2017  . Cough 11/11/2015  . Chronic respiratory failure with hypoxia ([place]) 11/11/2014  . Cigarette smoker 10/16/2014  . Multinodular thyroid 07/30/2014  . 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 COPD exacerbation ([place]) 02/11/2013  . Pedal edema 02/11/2013  . COPD GOLD III  06/11/2010  . CARCINOMA, BASAL CELL, 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  . HYPERLIPIDEMIA, MIXED 05/13/2010  . HYPERCALCEMIA 05/13/2010  . ANXIETY 05/13/2010  . Essential hypertension 05/13/2010  . CERVICALGIA 05/13/2010  . OSTEOPENIA 05/13/2010  . URINARY INCONTINENCE 05/13/2010  . CARCINOMA, BASAL CELL, 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    Past Surgical History:  Procedure Laterality Date  . BIOPSY  02/17/2021   Procedure: BIOPSY;  Surgeon: Otis Brace, MD;  [facility];  Service: Gastroenterology;;  . COLONOSCOPY    .  DENTAL SURGERY    . ELBOW ARTHROPLASTY Right 2020  . ESOPHAGOGASTRODUODENOSCOPY (EGD) WITH PROPOFOL N/A 02/17/2021   Procedure: ESOPHAGOGASTRODUODENOSCOPY (EGD) WITH PROPOFOL;  Surgeon: Otis Brace, MD;  Location: [facility];  Service: Gastroenterology;  Laterality: N/A;  . EYE SURGERY Right    cataract surgery  . FOOT SURGERY  30 YRS AGO   BILATERAL  . HARDWARE REMOVAL Right 10/02/2020   Procedure: HARDWARE REMOVAL RIGHT ELBOW;  Surgeon: Shona Needles, MD;  Location: [facility];  Service: Orthopedics;  Laterality:  Right;  . I & D EXTREMITY Right 01/13/2021   Procedure: IRRIGATION AND DEBRIDEMENT RIGHT ELBOW;  Surgeon: Shona Needles, MD;  Location: [facility];  Service: Orthopedics;  Laterality: Right;  . ORIF ELBOW FRACTURE Right 12/06/2017   Procedure: OPEN REDUCTION INTERNAL FIXATION (ORIF) ELBOW/OLECRANON FRACTURE;  Surgeon: Shona Needles, MD;  Location: [facility];  Service: Orthopedics;  Laterality: Right;  . SKIN CANCER EXCISION    . THYROIDECTOMY N/A 07/31/2014   Procedure: TOTAL THYROIDECTOMY;  Surgeon: Armandina Gemma, MD;  Location: [facility];  Service: General;  Laterality: N/A;  . VAGINAL HYSTERECTOMY    . VIDEO BRONCHOSCOPY WITH ENDOBRONCHIAL NAVIGATION Bilateral 06/25/2019   Procedure: VIDEO BRONCHOSCOPY WITH ENDOBRONCHIAL NAVIGATION;  Surgeon: Lajuana Matte, MD;  Location[facility];  Service: Thoracic;  Laterality: Bilateral;     OB History   No obstetric history on file.     Family History  Problem Relation Age of Onset  . Coronary artery disease Father   . Hypertension Father   . Asthma Father   . Allergies Father   . Hypertension Mother   . Lung cancer Mother     Social History   Tobacco Use  . Smoking status: Former Smoker    Packs/day: 1.00    Years: 30.00    Pack years: 30.00    Types: Cigarettes    Quit date: 02/24/2015    Years since quitting: 6.0  . Smokeless tobacco: Never Used  Vaping Use  . Vaping Use: Former  Substance Use Topics  . Alcohol use: Yes    Alcohol/week: 0.0 standard drinks    Comment: very seldom  . Drug use: Never    Home Medications Prior to Admission medications   Medication Sig Start Date End Date Taking? Authorizing Provider  ALPRAZolam Duanne Moron) 0.5 MG tablet Take 0.5 mg by mouth 2 (two) times daily as needed for anxiety. 09/14/20   [provider]  aspirin EC 81 MG tablet Take 81 mg by mouth daily. Swallow whole.    [provider]  atorvastatin (LIPITOR) 20 MG tablet Take 20 mg by mouth at bedtime.     [provider]  buPROPion (WELLBUTRIN XL) 300 MG 24 hr tablet Take 300 mg by mouth daily.    [provider]  busPIRone (BUSPAR) 30 MG tablet Take 30 mg by mouth 2 (two) times daily.    [provider]  cholecalciferol (VITAMIN D3) 25 MCG (1000 UNIT) tablet Take 1,000 Units by mouth daily.    [provider]  Dextromethorphan-guaiFENesin (MUCINEX DM MAXIMUM STRENGTH PO) Take 15 mLs by mouth every 4 (four) hours as needed (cough/cold symptoms).    [provider]  donepezil (ARICEPT) 10 MG tablet Take 10 mg by mouth at bedtime. 12/17/20   [provider]  Fluticasone-Umeclidin-Vilant 100-62.5-25 MCG/INH AEPB Inhale 1 puff into the lungs daily. Trelegy    [provider]  furosemide (LASIX) 20 MG tablet Take 20 mg by mouth daily.  12/18/20   [provider]  ipratropium-albuterol (DUONEB) 0.5-2.5 (3) MG/3ML SOLN Inhale 3 mLs into the lungs every 4 (four) hours as needed (respiratory issues.). 01/29/20   [provider]  levothyroxine (SYNTHROID) 137 MCG tablet Take 137 mcg by mouth daily before breakfast. 09/22/20   [provider]  metoprolol succinate (TOPROL-XL) 50 MG 24 hr tablet Take 50 mg by mouth daily. Take with or immediately following a meal.    [provider]  ondansetron (ZOFRAN) 8 MG tablet Take 8 mg by mouth every 8 (eight) hours as needed for nausea or vomiting. 02/09/21   [provider]  pantoprazole (PROTONIX) 40 MG tablet Take 1 tablet (40 mg total) by mouth 2 (two) times daily. 02/18/21 04/20/21  Dessa Phi, DO  predniSONE (DELTASONE) 10 MG tablet Take 20 mg by mouth daily with breakfast.    [provider]  traMADol (ULTRAM) 50 MG tablet Take 1 tablet (50 mg total) by mouth every 6 (six) hours as needed for severe pain. 01/13/21   Delray Alt, PA-C  VENTOLIN HFA 108 (90 Base) MCG/ACT inhaler INHALE 2 PUFFS INTO THE LUNGS EVERY 4 HOURS AS NEEDED FOR WHEEZING ORSHORTNESS OF BREATH Patient  taking differently: Inhale 2 puffs into the lungs every 4 (four) hours as needed for wheezing or shortness of breath. 06/29/17   Parrett, Fonnie Mu, NP    Allergies    Amlodipine besylate, Codeine, and Oxycodone-acetaminophen  Review of Systems   Review of Systems  All other systems reviewed and are negative.   Physical Exam Updated Vital Signs BP (!) 95/46   Pulse 71   Temp 98.1 F (36.7 C)   Resp (!) 21   SpO2 100%   Physical Exam Vitals and nursing note reviewed.  Constitutional:      Appearance: She is well-developed.  HENT:     Head: Normocephalic and atraumatic.  Cardiovascular:     Rate and Rhythm: Normal rate and regular rhythm.     Heart sounds: No murmur heard.   Pulmonary:     Effort: Pulmonary effort is normal. No respiratory distress.     Comments: Fine crackles on the lung bases, tachypnea Abdominal:     Palpations: Abdomen is soft.     Tenderness: There is no abdominal tenderness. There is no guarding or rebound.  Musculoskeletal:        General: No tenderness.     Comments: 3+ pitting edema to bilateral lower extremities. There is erythema to the left foot.  Skin:    General: Skin is warm and dry.  Neurological:     Mental Status: She is alert and oriented to person, place, and time.  Psychiatric:        Behavior: Behavior normal.     ED Results / Procedures / Treatments   Labs (all labs ordered are listed, but only abnormal results are displayed) Labs Reviewed  COMPREHENSIVE METABOLIC PANEL - Abnormal; Notable for the following components:      Result Value   Potassium 3.0 (*)    Chloride 93 (*)    CO2 44 (*)    Glucose, Bld 103 (*)    Creatinine, Ser 1.42 (*)    Total Protein 5.9 (*)    Albumin 3.3 (*)    GFR, Estimated 38 (*)    All other components within normal limits  CBC WITH DIFFERENTIAL/PLATELET - Abnormal; Notable for the following components:   WBC 10.6 (*)    RBC 3.71 (*)  Hemoglobin 11.0 (*)    RDW 18.0 (*)    Monocytes  Absolute 1.2 (*)    All other components within normal limits  D-DIMER, QUANTITATIVE - Abnormal; Notable for the following components:   D-Dimer, Quant 0.89 (*)    All other components within normal limits  CBG MONITORING, ED - Abnormal; Notable for the following components:   Glucose-Capillary 104 (*)    All other components within normal limits  RESP PANEL BY RT-PCR (FLU A&B, COVID) ARPGX2  BRAIN NATRIURETIC PEPTIDE  TROPONIN I (HIGH SENSITIVITY)  TROPONIN I (HIGH SENSITIVITY)    EKG EKG Interpretation  Date/Time:  Monday Feb 22 2021 18:06:32 EDT Ventricular Rate:  70 PR Interval:  136 QRS Duration: 95 QT Interval:  394 QTC Calculation: 426 R Axis:   44 Text Interpretation: Sinus rhythm Low voltage, precordial leads Confirmed by Quintella Reichert [phone]) on 02/22/2021 6:57:32 PM   Radiology CT Angio Chest PE W/Cm &/Or Wo Cm  Result Date: 02/22/2021 CLINICAL DATA:  PE suspected, low/intermediate prob, positive D-dimer Shortness of breath. Radiologic records states history of lung cancer. EXAM: CT ANGIOGRAPHY CHEST WITH CONTRAST TECHNIQUE: Multidetector CT imaging of the chest was performed using the standard protocol during bolus administration of intravenous contrast. Multiplanar CT image reconstructions and MIPs were obtained to evaluate the vascular anatomy. CONTRAST:  38mL OMNIPAQUE IOHEXOL 350 MG/ML SOLN COMPARISON:  Radiograph earlier this day.  Chest CT 11/17/2020 FINDINGS: Cardiovascular: There are no filling defects within the pulmonary arteries to suggest pulmonary embolus. Atherosclerosis of the thoracic aorta. No evidence of dissection or acute aortic findings. Normal heart size with coronary artery calcifications. Mediastinum/Nodes: No enlarged mediastinal or hilar lymph nodes. Esophagus minimally patulous but no wall thickening. Thyroidectomy. Lungs/Pleura: Moderate emphysema. Post treatment scarring and architectural distortion of the paramediastinal anterior left upper lobe.  This is not significantly changed in appearance from prior exam. Spiculated right middle lobe nodule measures 10 x 9 mm, series 6, image 71, not significantly changed in the interim. Bronchial thickening with occasional areas of mucous plugging in the lower lobes. No new pulmonary nodule or acute airspace disease. No pleural fluid. Upper Abdomen: No acute or unexpected findings. Musculoskeletal: No acute osseous abnormality or focal osseous lesion. Mild thoracic spondylosis. No chest wall soft tissue abnormality. Review of the MIP images confirms the above findings. IMPRESSION: 1. No pulmonary embolus or acute intrathoracic abnormality. 2. Post treatment scarring and architectural distortion in the paramediastinal anterior left upper lobe, unchanged from prior exam. 3. Spiculated right middle lobe nodule is unchanged in size from prior exam, 10 x 9 mm. 4. Aortic atherosclerosis.  Coronary artery calcifications. 5. Emphysema. Aortic Atherosclerosis (ICD10-I70.0) and Emphysema (ICD10-J43.9). Electronically Signed   By: Keith Rake M.D.   On: 02/22/2021 22:25   US Venous Img Lower Unilateral Left  Result Date: 02/22/2021 CLINICAL DATA:  Lower extremity edema EXAM: Left LOWER EXTREMITY VENOUS DOPPLER ULTRASOUND TECHNIQUE: Gray-scale sonography with graded compression, as well as color Doppler and duplex ultrasound were performed to evaluate the lower extremity deep venous systems from the level of the common femoral vein and including the common femoral, femoral, profunda femoral, popliteal and calf veins including the posterior tibial, peroneal and gastrocnemius veins when visible. The superficial great saphenous vein was also interrogated. Spectral Doppler was utilized to evaluate flow at rest and with distal augmentation maneuvers in the common femoral, femoral and popliteal veins. COMPARISON:  None. FINDINGS: Contralateral Common Femoral Vein: Respiratory phasicity is normal and symmetric with the  symptomatic side.  No evidence of thrombus. Normal compressibility. Common Femoral Vein: No evidence of thrombus. Normal compressibility, respiratory phasicity and response to augmentation. Saphenofemoral Junction: No evidence of thrombus. Normal compressibility and flow on color Doppler imaging. Profunda Femoral Vein: No evidence of thrombus. Normal compressibility and flow on color Doppler imaging. Femoral Vein: No evidence of thrombus. Normal compressibility, respiratory phasicity and response to augmentation. Popliteal Vein: No evidence of thrombus. Normal compressibility, respiratory phasicity and response to augmentation. Calf Veins: No evidence of thrombus. Normal compressibility and flow on color Doppler imaging. Superficial Great Saphenous Vein: No evidence of thrombus. Normal compressibility. Venous Reflux:  None. Other Findings:  Calf edema is noted. IMPRESSION: No evidence of deep venous thrombosis. Electronically Signed   By: Inez Catalina M.D.   On: 02/22/2021 20:44   DG Chest Port 1 View  Result Date: 02/22/2021 CLINICAL DATA:  Lethargy x3 days. EXAM: PORTABLE CHEST 1 VIEW COMPARISON:  February 10, 2021 FINDINGS: The lungs are mildly hyperinflated. Chronic appearing increased lung markings are seen within the bilateral lower lobes. There is no evidence of acute infiltrate, pleural effusion or pneumothorax. The heart size and mediastinal contours are within normal limits. The visualized skeletal structures are unremarkable. IMPRESSION: No acute or active cardiopulmonary disease. Electronically Signed   By: Virgina Norfolk M.D.   On: 02/22/2021 18:13    Procedures Procedures  CRITICAL CARE Performed by: Quintella Reichert   Total critical care time: 35 minutes  Critical care time was exclusive of separately billable procedures and treating other patients.  Critical care was necessary to treat or prevent imminent or life-threatening deterioration.  Critical care was time spent personally by me  on the following activities: development of treatment plan with patient and/or surrogate as well as nursing, discussions with consultants, evaluation of patient's response to treatment, examination of patient, obtaining history from patient or surrogate, ordering and performing treatments and interventions, ordering and review of laboratory studies, ordering and review of radiographic studies, pulse oximetry and re-evaluation of patient's condition.  Medications Ordered in ED Medications  sodium chloride 0.9 % bolus 250 mL (0 mLs Intravenous Stopped 02/22/21 2203)  iohexol (OMNIPAQUE) 350 MG/ML injection 100 mL (60 mLs Intravenous Contrast Given 02/22/21 2200)  sodium chloride 0.9 % bolus 500 mL (500 mLs Intravenous New Bag/Given 02/22/21 2315)  predniSONE (DELTASONE) tablet 20 mg (20 mg Oral Given 02/22/21 2315)  hydrocortisone sodium succinate (SOLU-CORTEF) 100 MG injection 100 mg (100 mg Intravenous Given 02/22/21 2313)  potassium chloride SA (KLOR-CON) CR tablet 40 mEq (40 mEq Oral Given 02/22/21 2319)    ED Course  I have reviewed the triage vital signs and the nursing notes.  Pertinent labs & imaging results that were available during my care of the patient were reviewed by me and considered in my medical decision making (see chart for details).    MDM Rules/Calculators/A&P                         patient with history of COPD that is oxygen dependent and steroid dependent here for evaluation of progressive fatigue and weakness in setting of recent hospitalization for G.I. bleed. On evaluation she has lower extremity edema and generalized weakness but is in no acute distress. Labs demonstrate improved hemoglobin when compared to priors, mild hypokalemia. She was treated with IV fluids for hypotension, stress dose steroids for possible adrenal insufficiency. Source of hypertension unclear at this time. Vascular ultrasound is negative for DVT, CTA is negative for PE. No  evidence of sepsis or acute  infectious process at this time. No evidence of recurrent bleeding. Plan to admit for ongoing workup for hypotension and fatigue. Hospitalist consulted for admission. Patient and daughter updated findings of studies recommendation for mission and they are in agreement treatment plan.  Final Clinical Impression(s) / ED Diagnoses Final diagnoses:  None    Rx / DC Orders ED Discharge Orders    None       Quintella Reichert, MD 02/22/21 2347

## 2021-02-23 ENCOUNTER — Observation Stay (HOSPITAL_COMMUNITY): Payer: Medicare Other

## 2021-02-23 ENCOUNTER — Encounter (HOSPITAL_COMMUNITY): Payer: Self-pay | Admitting: Family Medicine

## 2021-02-23 DIAGNOSIS — J449 Chronic obstructive pulmonary disease, unspecified: Secondary | ICD-10-CM | POA: Diagnosis not present

## 2021-02-23 DIAGNOSIS — I959 Hypotension, unspecified: Secondary | ICD-10-CM

## 2021-02-23 DIAGNOSIS — I9589 Other hypotension: Secondary | ICD-10-CM | POA: Diagnosis not present

## 2021-02-23 DIAGNOSIS — J9611 Chronic respiratory failure with hypoxia: Secondary | ICD-10-CM | POA: Diagnosis present

## 2021-02-23 DIAGNOSIS — E876 Hypokalemia: Secondary | ICD-10-CM

## 2021-02-23 DIAGNOSIS — Z79899 Other long term (current) drug therapy: Secondary | ICD-10-CM | POA: Diagnosis not present

## 2021-02-23 DIAGNOSIS — Z7951 Long term (current) use of inhaled steroids: Secondary | ICD-10-CM | POA: Diagnosis not present

## 2021-02-23 DIAGNOSIS — I509 Heart failure, unspecified: Secondary | ICD-10-CM

## 2021-02-23 DIAGNOSIS — Z85820 Personal history of malignant melanoma of skin: Secondary | ICD-10-CM | POA: Diagnosis not present

## 2021-02-23 DIAGNOSIS — I878 Other specified disorders of veins: Secondary | ICD-10-CM | POA: Diagnosis present

## 2021-02-23 DIAGNOSIS — I952 Hypotension due to drugs: Secondary | ICD-10-CM

## 2021-02-23 DIAGNOSIS — Z8249 Family history of ischemic heart disease and other diseases of the circulatory system: Secondary | ICD-10-CM | POA: Diagnosis not present

## 2021-02-23 DIAGNOSIS — E782 Mixed hyperlipidemia: Secondary | ICD-10-CM | POA: Diagnosis present

## 2021-02-23 DIAGNOSIS — N1831 Chronic kidney disease, stage 3a: Secondary | ICD-10-CM | POA: Diagnosis present

## 2021-02-23 DIAGNOSIS — I130 Hypertensive heart and chronic kidney disease with heart failure and stage 1 through stage 4 chronic kidney disease, or unspecified chronic kidney disease: Secondary | ICD-10-CM | POA: Diagnosis present

## 2021-02-23 DIAGNOSIS — Z79890 Hormone replacement therapy (postmenopausal): Secondary | ICD-10-CM | POA: Diagnosis not present

## 2021-02-23 DIAGNOSIS — D696 Thrombocytopenia, unspecified: Secondary | ICD-10-CM | POA: Diagnosis present

## 2021-02-23 DIAGNOSIS — Z801 Family history of malignant neoplasm of trachea, bronchus and lung: Secondary | ICD-10-CM | POA: Diagnosis not present

## 2021-02-23 DIAGNOSIS — Z7952 Long term (current) use of systemic steroids: Secondary | ICD-10-CM | POA: Diagnosis not present

## 2021-02-23 DIAGNOSIS — Z87891 Personal history of nicotine dependence: Secondary | ICD-10-CM | POA: Diagnosis not present

## 2021-02-23 DIAGNOSIS — Z7982 Long term (current) use of aspirin: Secondary | ICD-10-CM | POA: Diagnosis not present

## 2021-02-23 DIAGNOSIS — I5033 Acute on chronic diastolic (congestive) heart failure: Secondary | ICD-10-CM | POA: Diagnosis present

## 2021-02-23 DIAGNOSIS — N179 Acute kidney failure, unspecified: Secondary | ICD-10-CM

## 2021-02-23 DIAGNOSIS — J441 Chronic obstructive pulmonary disease with (acute) exacerbation: Secondary | ICD-10-CM | POA: Diagnosis present

## 2021-02-23 DIAGNOSIS — F419 Anxiety disorder, unspecified: Secondary | ICD-10-CM | POA: Diagnosis present

## 2021-02-23 DIAGNOSIS — Z825 Family history of asthma and other chronic lower respiratory diseases: Secondary | ICD-10-CM | POA: Diagnosis not present

## 2021-02-23 DIAGNOSIS — Z20822 Contact with and (suspected) exposure to covid-19: Secondary | ICD-10-CM | POA: Diagnosis present

## 2021-02-23 DIAGNOSIS — L89312 Pressure ulcer of right buttock, stage 2: Secondary | ICD-10-CM | POA: Diagnosis present

## 2021-02-23 LAB — BASIC METABOLIC PANEL
Anion gap: 5 (ref 5–15)
BUN: 16 mg/dL (ref 8–23)
CO2: 40 mmol/L — ABNORMAL HIGH (ref 22–32)
Calcium: 9.2 mg/dL (ref 8.9–10.3)
Chloride: 96 mmol/L — ABNORMAL LOW (ref 98–111)
Creatinine, Ser: 1.15 mg/dL — ABNORMAL HIGH (ref 0.44–1.00)
GFR, Estimated: 49 mL/min — ABNORMAL LOW (ref >60)
Glucose, Bld: 160 mg/dL — ABNORMAL HIGH (ref 70–99)
Potassium: 3.3 mmol/L — ABNORMAL LOW (ref 3.5–5.1)
Sodium: 141 mmol/L (ref 135–145)

## 2021-02-23 LAB — CBC
HCT: 30.6 % — ABNORMAL LOW (ref 36.0–46.0)
Hemoglobin: 9.2 g/dL — ABNORMAL LOW (ref 12.0–15.0)
MCH: 29.7 pg (ref 26.0–34.0)
MCHC: 30.1 g/dL (ref 30.0–36.0)
MCV: 98.7 fL (ref 80.0–100.0)
Platelets: 128 10*3/uL — ABNORMAL LOW (ref 150–400)
RBC: 3.1 MIL/uL — ABNORMAL LOW (ref 3.87–5.11)
RDW: 17.7 % — ABNORMAL HIGH (ref 11.5–15.5)
WBC: 6.1 10*3/uL (ref 4.0–10.5)
nRBC: 0 % (ref 0.0–0.2)

## 2021-02-23 LAB — ECHOCARDIOGRAM COMPLETE
Area-P 1/2: 3.37 cm2
S' Lateral: 2 cm

## 2021-02-23 LAB — MAGNESIUM: Magnesium: 1.9 mg/dL (ref 1.7–2.4)

## 2021-02-23 LAB — BRAIN NATRIURETIC PEPTIDE: B Natriuretic Peptide: 53.4 pg/mL (ref 0.0–100.0)

## 2021-02-23 MED ORDER — POTASSIUM CHLORIDE CRYS ER 20 MEQ PO TBCR
40.0000 meq | EXTENDED_RELEASE_TABLET | Freq: Once | ORAL | Status: AC
  Administered 2021-02-23: 40 meq via ORAL
  Filled 2021-02-23: qty 2

## 2021-02-23 MED ORDER — MOMETASONE FURO-FORMOTEROL FUM 100-5 MCG/ACT IN AERO
2.0000 | INHALATION_SPRAY | Freq: Two times a day (BID) | RESPIRATORY_TRACT | Status: DC
  Administered 2021-02-24 – 2021-02-25 (×3): 2 via RESPIRATORY_TRACT
  Filled 2021-02-23: qty 8.8

## 2021-02-23 MED ORDER — POTASSIUM CHLORIDE IN NACL 20-0.9 MEQ/L-% IV SOLN
INTRAVENOUS | Status: DC
  Filled 2021-02-23: qty 1000

## 2021-02-23 MED ORDER — ALPRAZOLAM 0.5 MG PO TABS
0.5000 mg | ORAL_TABLET | Freq: Two times a day (BID) | ORAL | Status: DC | PRN
  Administered 2021-02-23 – 2021-02-24 (×2): 0.5 mg via ORAL
  Filled 2021-02-23 (×2): qty 1

## 2021-02-23 MED ORDER — IPRATROPIUM-ALBUTEROL 0.5-2.5 (3) MG/3ML IN SOLN: 3.0000 mL | RESPIRATORY_TRACT | Status: DC | PRN

## 2021-02-23 MED ORDER — METOPROLOL SUCCINATE ER 50 MG PO TB24: 50.0000 mg | ORAL_TABLET | Freq: Every day | ORAL | Status: DC

## 2021-02-23 MED ORDER — FLUTICASONE-UMECLIDIN-VILANT 100-62.5-25 MCG/INH IN AEPB: 1.0000 | INHALATION_SPRAY | Freq: Every day | RESPIRATORY_TRACT | Status: DC

## 2021-02-23 MED ORDER — PANTOPRAZOLE SODIUM 40 MG PO TBEC
40.0000 mg | DELAYED_RELEASE_TABLET | Freq: Two times a day (BID) | ORAL | Status: DC
  Administered 2021-02-23 – 2021-02-25 (×5): 40 mg via ORAL
  Filled 2021-02-23 (×5): qty 1

## 2021-02-23 MED ORDER — ACETAMINOPHEN 650 MG RE SUPP: 650.0000 mg | Freq: Four times a day (QID) | RECTAL | Status: DC | PRN

## 2021-02-23 MED ORDER — FUROSEMIDE 10 MG/ML IJ SOLN
20.0000 mg | Freq: Two times a day (BID) | INTRAMUSCULAR | Status: AC
  Administered 2021-02-23 – 2021-02-24 (×2): 20 mg via INTRAVENOUS
  Filled 2021-02-23 (×2): qty 2

## 2021-02-23 MED ORDER — ALBUTEROL SULFATE HFA 108 (90 BASE) MCG/ACT IN AERS
2.0000 | INHALATION_SPRAY | RESPIRATORY_TRACT | Status: DC | PRN
  Filled 2021-02-23: qty 6.7

## 2021-02-23 MED ORDER — ONDANSETRON HCL 4 MG/2ML IJ SOLN: 4.0000 mg | Freq: Four times a day (QID) | INTRAMUSCULAR | Status: DC | PRN

## 2021-02-23 MED ORDER — ACETAMINOPHEN 325 MG PO TABS
650.0000 mg | ORAL_TABLET | Freq: Four times a day (QID) | ORAL | Status: DC | PRN
  Filled 2021-02-23: qty 2

## 2021-02-23 MED ORDER — LACTATED RINGERS IV SOLN: INTRAVENOUS | Status: DC

## 2021-02-23 MED ORDER — LEVOTHYROXINE SODIUM 25 MCG PO TABS
137.0000 ug | ORAL_TABLET | Freq: Every day | ORAL | Status: DC
  Administered 2021-02-23 – 2021-02-25 (×3): 137 ug via ORAL
  Filled 2021-02-23 (×3): qty 1

## 2021-02-23 MED ORDER — PREDNISONE 20 MG PO TABS
20.0000 mg | ORAL_TABLET | Freq: Every day | ORAL | Status: DC
  Administered 2021-02-23 – 2021-02-25 (×3): 20 mg via ORAL
  Filled 2021-02-23 (×3): qty 1

## 2021-02-23 MED ORDER — KCL-LACTATED RINGERS 20 MEQ/L IV SOLN
INTRAVENOUS | Status: DC
  Filled 2021-02-23: qty 1000

## 2021-02-23 MED ORDER — ATORVASTATIN CALCIUM 20 MG PO TABS
20.0000 mg | ORAL_TABLET | Freq: Every day | ORAL | Status: DC
  Administered 2021-02-23 – 2021-02-24 (×2): 20 mg via ORAL
  Filled 2021-02-23 (×2): qty 1

## 2021-02-23 MED ORDER — ONDANSETRON HCL 4 MG PO TABS: 4.0000 mg | ORAL_TABLET | Freq: Four times a day (QID) | ORAL | Status: DC | PRN

## 2021-02-23 MED ORDER — UMECLIDINIUM BROMIDE 62.5 MCG/INH IN AEPB
1.0000 | INHALATION_SPRAY | Freq: Every day | RESPIRATORY_TRACT | Status: DC
  Administered 2021-02-24 – 2021-02-25 (×2): 1 via RESPIRATORY_TRACT
  Filled 2021-02-23: qty 7

## 2021-02-23 NOTE — TOC Initial Note (Signed)
Transition of Care [facility]) - Initial/Assessment Note    Patient Details  Name: Holly [name] MRN: [id number] Date of Birth: 24-Nov-1945  Transition of [name]) CM/SW Contact:    Dessa Phi, RN Phone Number: 02/23/2021, 1:52 PM  Clinical Narrative:PT cons-await recc.                    Expected Discharge Plan: [place] Barriers to Discharge: Continued Medical Work up   Patient Goals and CMS Choice Patient states their goals for this hospitalization and ongoing recovery are:: go home CMS Medicare.gov Compare Post Acute Care list provided to:: Patient Choice offered to / list presented to : Patient  Expected Discharge Plan and Services Expected Discharge Plan: [place]   Discharge Planning Services: CM Consult   Living arrangements for the past 2 months: Single Family Home                                      Prior Living Arrangements/Services Living arrangements for the past 2 months: Single Family Home Lives with:: Self   Do you feel safe going back to the place where you live?: Yes          Current home services: DME (rw,w/c,[facility] 02)    Activities of Daily Living Home Assistive Devices/Equipment: Walker (specify type),Wheelchair ADL Screening (condition at time of admission) Patient's cognitive ability adequate to safely complete daily activities?: Yes Is the patient deaf or have difficulty hearing?: No Does the patient have difficulty seeing, even when wearing glasses/contacts?: No Does the patient have difficulty concentrating, remembering, or making decisions?: No Patient able to express need for assistance with ADLs?: Yes Does the patient have difficulty dressing or bathing?: No Independently performs ADLs?: Yes (appropriate for developmental age) Does the patient have difficulty walking or climbing stairs?: Yes Weakness of Legs: None Weakness of Arms/Hands: None  Permission Sought/Granted    Permission granted to share information with : Yes, Verbal Permission Granted  Share Information with NAME: Care Manager           Emotional Assessment              Admission diagnosis:  Hypotension, unspecified hypotension type [I95.9] Acute renal failure superimposed on stage 3a chronic kidney disease ([place]) [N17.9, N18.31] Patient Active Problem List   Diagnosis Date Noted  . Hypotension 02/23/2021  . Hypokalemia 02/23/2021  . Acute renal failure superimposed on stage 3a chronic kidney disease ([place]) 02/22/2021  . GI bleed 02/16/2021  . ABLA (acute blood loss anemia) 02/16/2021  . Diverticulitis 02/16/2021  . Current chronic use of systemic steroids 02/16/2021  . Pressure injury of skin 02/16/2021  . Olecranon bursitis of right elbow 10/02/2020  . Ulna, olecranon process fracture, right, closed, with routine healing, subsequent encounter 10/01/2020  . Primary malignant neoplasm of left upper lobe of lung ([place]) 07/09/2019  . Closed fracture of right olecranon process 12/04/2017  . Cough 11/11/2015  . Chronic respiratory failure with hypoxia ([place]) 11/11/2014  . Cigarette smoker 10/16/2014  . Multinodular thyroid 07/30/2014  . 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 COPD exacerbation ([place]) 02/11/2013  . Pedal edema 02/11/2013  . COPD GOLD III  06/11/2010  . CARCINOMA, BASAL CELL, 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  . HYPERLIPIDEMIA, MIXED 05/13/2010  . HYPERCALCEMIA 05/13/2010  . ANXIETY 05/13/2010  . Essential hypertension 05/13/2010  . CERVICALGIA  05/13/2010  . OSTEOPENIA 05/13/2010  . URINARY INCONTINENCE 05/13/2010  . CARCINOMA, BASAL CELL, 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   PCP:  Harlan Stains, MD Pharmacy:   [facility] [address], [place] [address] Phone: [phone] Fax: [phone]  [facility] - [place], [place] - [address] [place] [address] Phone: [phone] Fax: [phone]     Social Determinants of Health ([place]) Interventions    Readmission Risk Interventions Readmission Risk Prevention Plan 02/18/2021  Transportation Screening Complete  PCP or Specialist Appt within 5-7 Days Complete  Home Care Screening Complete  Medication Review (RN CM) Complete  Some recent data might be hidden

## 2021-02-23 NOTE — Progress Notes (Signed)
Patient reports having a bowel movement this afternoon that was "black and loose". Pt stated that this has been occurring x 1 week. WIll update provider and will continue to monitor.

## 2021-02-23 NOTE — Plan of Care (Signed)

## 2021-02-23 NOTE — Progress Notes (Addendum)
Progress Note    Holly [name]  URK:[id number] DOB: 1945-10-24  DOA: 02/22/2021 PCP: Harlan Stains, MD      Brief Narrative:    Medical records reviewed and are as summarized below:  Holly [name] is a 74 y.o. female with medical history significant for multiple comorbidities including but not limited to hypertension, COPD, chronic hypoxic respiratory failure on 3 L/min oxygen, CKD stage IIIa, hypothyroidism, hyperlipidemia, thyroid neoplasm, basal cell cancer, recent GI bleed requiring blood transfusion, presented to the hospital because of generalized weakness and fatigue of about 3 days duration.  She was found to have hypotension (BP was 79/60), AKI on CKD stage IIIa.  She was treated with IV fluids.  It is suspected that patient has underlying CHF and developed acute on chronic CHF.  2D echo is pending.      Assessment/Plan:   Principal Problem:   Acute renal failure superimposed on stage 3a chronic kidney disease (HCC) Active Problems:   COPD GOLD III    Hypotension   Hypokalemia   Hypotension: BP has improved with IV fluids.  Discontinue IV fluids.  She was taking valsartan-HCTZ prior to admission.  This has been held.  Peripheral edema, fluid overload: Suspect acute CHF.  She probably has underlying CHF.  Obtain 2D echo for further evaluation.  Treat with IV Lasix.  AKI on CKD stage IIIa: Creatinine is better.  Monitor BMP closely.  COPD with chronic hypoxic respiratory failure: Continue bronchodilators and 3 L/min oxygen via nasal cannula  Hypokalemia: Replete potassium and monitor levels.  Acute thrombocytopenia: Platelet dropped from 193-128.  Repeat CBC tomorrow  .  Diet Order            Diet Heart Room service appropriate? Yes; Fluid consistency: Thin  Diet effective now                    Consultants:  None  Procedures:  None    Medications:   . atorvastatin  20 mg Oral QHS  . Fluticasone-Umeclidin-Vilant  1 puff Inhalation Daily   . furosemide  20 mg Intravenous BID  . levothyroxine  137 mcg Oral Q0600  . pantoprazole  40 mg Oral BID  . potassium chloride  40 mEq Oral Once  . predniSONE  20 mg Oral Q breakfast   Continuous Infusions:   Anti-infectives (From admission, onward)   None             Family Communication/Anticipated D/C date and plan/Code Status   DVT prophylaxis: SCDs Start: 02/23/21 0255     Code Status: Full Code  Family Communication: [name] (daughter), over the phone Disposition Plan:    Status is: Observation  The patient will require care spanning > 2 midnights and should be moved to inpatient because: IV treatments appropriate due to intensity of illness or inability to take PO and Inpatient level of care appropriate due to severity of illness  Dispo: The patient is from: Home              Anticipated d/c is to: Home              Patient currently is not medically stable to d/c.   Difficult to place patient No           Subjective:   C/o bilateral leg swelling for several weeks that has progressively worsened.  Lasix at home has not helped.  No shortness of breath or chest pain.  Objective:  Vitals:   02/23/21 0130 02/23/21 0145 02/23/21 0239 02/23/21 0624  BP: (!) 102/48  110/62 (!) 102/59  Pulse:   73 65  Resp: (!) 22 (!) 22 20 19   Temp:   98.4 F (36.9 C) 98.1 F (36.7 C)  TempSrc:   Oral Oral  SpO2:   100% 100%   Orthostatic VS for the past 24 hrs:  BP- Lying Pulse- Lying BP- Sitting Pulse- Sitting BP- Standing at 0 minutes Pulse- Standing at 0 minutes  02/22/21 2057 98/45 69 92/43 68 98/41 71    No intake or output data in the 24 hours ending 02/23/21 1125 There were no vitals filed for this visit.  Exam:  GEN: NAD SKIN: Stage II right buttock decubitus ulcer EYES: No pallor or icterus ENT: MMM CV: RRR PULM: Bibasilar rales.  No wheezing ABD: soft, ND, NT, +BS CNS: AAO x 3, non focal EXT: B/l leg edema (2+), no  tenderness    Pressure Injury 02/16/21 Buttocks Right Stage 2 -  Partial thickness loss of dermis presenting as a shallow open injury with a red, pink wound bed without slough. (Active)  02/16/21 0230  Location: Buttocks  Location Orientation: Right  Staging: Stage 2 -  Partial thickness loss of dermis presenting as a shallow open injury with a red, pink wound bed without slough.  Wound Description (Comments):   Present on Admission: Yes     Data Reviewed:   I have personally reviewed following labs and imaging studies:  Labs: Labs show the following:   Basic Metabolic Panel: Recent Labs  Lab 02/17/21 0120 02/18/21 0540 02/22/21 1753 02/23/21 0502  NA 145 143 144 141  K 4.0 3.7 3.0* 3.3*  CL 106 104 93* 96*  CO2 34* 33* 44* 40*  GLUCOSE 106* 83 103* 160*  BUN 23 19 16 16   CREATININE 1.01* 1.10* 1.42* 1.15*  CALCIUM 8.8* 9.3 9.7 9.2  MG 1.6*  --   --  1.9  PHOS 2.8  --   --   --    GFR Estimated Creatinine Clearance: 37.3 mL/min (A) (by C-G formula based on SCr of 1.15 mg/dL (H)). Liver Function Tests: Recent Labs  Lab 02/17/21 0120 02/18/21 0540 02/22/21 1753  AST 22 23 16   ALT 15 15 13   ALKPHOS 47 57 73  BILITOT 0.4 0.7 0.5  PROT 4.3* 4.7* 5.9*  ALBUMIN 2.0* 2.2* 3.3*   No results for input(s): LIPASE, AMYLASE in the last 168 hours. No results for input(s): AMMONIA in the last 168 hours. Coagulation profile No results for input(s): INR, PROTIME in the last 168 hours.  CBC: Recent Labs  Lab 02/17/21 0120 02/17/21 1635 02/18/21 0540 02/22/21 1753 02/23/21 0502  WBC 7.4 7.5 6.3 10.6* 6.1  NEUTROABS  --   --   --  6.3  --   HGB 9.5* 9.7* 9.7* 11.0* 9.2*  HCT 29.3* 30.8* 31.7* 36.7 30.6*  MCV 92.7 93.9 96.6 98.9 98.7  PLT 120* 125* 121* 193 128*   Cardiac Enzymes: No results for input(s): CKTOTAL, CKMB, CKMBINDEX, TROPONINI in the last 168 hours. BNP (last 3 results) No results for input(s): PROBNP in the last 8760 hours. CBG: Recent Labs   Lab 02/22/21 1800  GLUCAP 104*   D-Dimer: Recent Labs    02/22/21 2057  DDIMER 0.89*   Hgb A1c: No results for input(s): HGBA1C in the last 72 hours. Lipid Profile: No results for input(s): CHOL, HDL, LDLCALC, TRIG, CHOLHDL, LDLDIRECT in the last 72 hours.  Thyroid function studies: No results for input(s): TSH, T4TOTAL, T3FREE, THYROIDAB in the last 72 hours.  Invalid input(s): FREET3 Anemia work up: No results for input(s): VITAMINB12, FOLATE, FERRITIN, TIBC, IRON, RETICCTPCT in the last 72 hours. Sepsis Labs: Recent Labs  Lab 02/17/21 1635 02/18/21 0540 02/22/21 1753 02/23/21 0502  WBC 7.5 6.3 10.6* 6.1    Microbiology Recent Results (from the past 240 hour(s))  MRSA PCR Screening     Status: None   Collection Time: 02/16/21  3:05 PM   Specimen: Nasopharyngeal  Result Value Ref Range Status   MRSA by PCR NEGATIVE NEGATIVE Final    Comment:        The GeneXpert MRSA Assay (FDA approved for NASAL specimens only), is one component of a comprehensive MRSA colonization surveillance program. It is not intended to diagnose MRSA infection nor to guide or monitor treatment for MRSA infections. Performed at [facility], [place] [address] [place], [place] [address]   Resp Panel by RT-PCR (Flu A&B, Covid) Nasopharyngeal Swab     Status: None   Collection Time: 02/22/21  5:37 PM   Specimen: Nasopharyngeal Swab; Nasopharyngeal(NP) swabs in vial transport medium  Result Value Ref Range Status   SARS Coronavirus 2 by RT PCR NEGATIVE NEGATIVE Final    Comment: (NOTE) SARS-CoV-2 target nucleic acids are NOT DETECTED.  The SARS-CoV-2 RNA is generally detectable in upper respiratory specimens during the acute phase of infection. The lowest concentration of SARS-CoV-2 viral copies this assay can detect is 138 copies/mL. A negative result does not preclude SARS-Cov-2 infection and should not be used as the sole basis for treatment or other patient management  decisions. A negative result may occur with  improper specimen collection/handling, submission of specimen other than nasopharyngeal swab, presence of viral mutation(s) within the areas targeted by this assay, and inadequate number of viral copies(<138 copies/mL). A negative result must be combined with clinical observations, patient history, and epidemiological information. The expected result is Negative.  Fact Sheet for Patients:  EntrepreneurPulse.com.au  Fact Sheet for Healthcare Providers:  IncredibleEmployment.be  This test is no t yet approved or cleared by the [place] FDA and  has been authorized for detection and/or diagnosis of SARS-CoV-2 by FDA under an Emergency Use Authorization (EUA). This EUA will remain  in effect (meaning this test can be used) for the duration of the COVID-19 declaration under Section 564(b)(1) of the Act, 21 U.S.C.section 360bbb-3(b)(1), unless the authorization is terminated  or revoked sooner.       Influenza A by PCR NEGATIVE NEGATIVE Final   Influenza B by PCR NEGATIVE NEGATIVE Final    Comment: (NOTE) The Xpert Xpress SARS-CoV-2/FLU/RSV plus assay is intended as an aid in the diagnosis of influenza from Nasopharyngeal swab specimens and should not be used as a sole basis for treatment. Nasal washings and aspirates are unacceptable for Xpert Xpress SARS-CoV-2/FLU/RSV testing.  Fact Sheet for Patients: EntrepreneurPulse.com.au  Fact Sheet for Healthcare Providers: IncredibleEmployment.be  This test is not yet approved or cleared by the [place] FDA and has been authorized for detection and/or diagnosis of SARS-CoV-2 by FDA under an Emergency Use Authorization (EUA). This EUA will remain in effect (meaning this test can be used) for the duration of the COVID-19 declaration under Section 564(b)(1) of the Act, 21 U.S.C. section 360bbb-3(b)(1), unless the  authorization is terminated or revoked.  Performed at [facility], [address], [place], [place] [address]     Procedures and diagnostic studies:  CT Angio Chest PE W/Cm &/Or Wo Cm  Result Date: 02/22/2021 CLINICAL DATA:  PE suspected, low/intermediate prob, positive D-dimer Shortness of breath. Radiologic records states history of lung cancer. EXAM: CT ANGIOGRAPHY CHEST WITH CONTRAST TECHNIQUE: Multidetector CT imaging of the chest was performed using the standard protocol during bolus administration of intravenous contrast. Multiplanar CT image reconstructions and MIPs were obtained to evaluate the vascular anatomy. CONTRAST:  7mL OMNIPAQUE IOHEXOL 350 MG/ML SOLN COMPARISON:  Radiograph earlier this day.  Chest CT 11/17/2020 FINDINGS: Cardiovascular: There are no filling defects within the pulmonary arteries to suggest pulmonary embolus. Atherosclerosis of the thoracic aorta. No evidence of dissection or acute aortic findings. Normal heart size with coronary artery calcifications. Mediastinum/Nodes: No enlarged mediastinal or hilar lymph nodes. Esophagus minimally patulous but no wall thickening. Thyroidectomy. Lungs/Pleura: Moderate emphysema. Post treatment scarring and architectural distortion of the paramediastinal anterior left upper lobe. This is not significantly changed in appearance from prior exam. Spiculated right middle lobe nodule measures 10 x 9 mm, series 6, image 71, not significantly changed in the interim. Bronchial thickening with occasional areas of mucous plugging in the lower lobes. No new pulmonary nodule or acute airspace disease. No pleural fluid. Upper Abdomen: No acute or unexpected findings. Musculoskeletal: No acute osseous abnormality or focal osseous lesion. Mild thoracic spondylosis. No chest wall soft tissue abnormality. Review of the MIP images confirms the above findings. IMPRESSION: 1. No pulmonary embolus or acute intrathoracic abnormality.  2. Post treatment scarring and architectural distortion in the paramediastinal anterior left upper lobe, unchanged from prior exam. 3. Spiculated right middle lobe nodule is unchanged in size from prior exam, 10 x 9 mm. 4. Aortic atherosclerosis.  Coronary artery calcifications. 5. Emphysema. Aortic Atherosclerosis (ICD10-I70.0) and Emphysema (ICD10-J43.9). Electronically Signed   By: Keith Rake M.D.   On: 02/22/2021 22:25   US Venous Img Lower Unilateral Left  Result Date: 02/22/2021 CLINICAL DATA:  Lower extremity edema EXAM: Left LOWER EXTREMITY VENOUS DOPPLER ULTRASOUND TECHNIQUE: Gray-scale sonography with graded compression, as well as color Doppler and duplex ultrasound were performed to evaluate the lower extremity deep venous systems from the level of the common femoral vein and including the common femoral, femoral, profunda femoral, popliteal and calf veins including the posterior tibial, peroneal and gastrocnemius veins when visible. The superficial great saphenous vein was also interrogated. Spectral Doppler was utilized to evaluate flow at rest and with distal augmentation maneuvers in the common femoral, femoral and popliteal veins. COMPARISON:  None. FINDINGS: Contralateral Common Femoral Vein: Respiratory phasicity is normal and symmetric with the symptomatic side. No evidence of thrombus. Normal compressibility. Common Femoral Vein: No evidence of thrombus. Normal compressibility, respiratory phasicity and response to augmentation. Saphenofemoral Junction: No evidence of thrombus. Normal compressibility and flow on color Doppler imaging. Profunda Femoral Vein: No evidence of thrombus. Normal compressibility and flow on color Doppler imaging. Femoral Vein: No evidence of thrombus. Normal compressibility, respiratory phasicity and response to augmentation. Popliteal Vein: No evidence of thrombus. Normal compressibility, respiratory phasicity and response to augmentation. Calf Veins: No  evidence of thrombus. Normal compressibility and flow on color Doppler imaging. Superficial Great Saphenous Vein: No evidence of thrombus. Normal compressibility. Venous Reflux:  None. Other Findings:  Calf edema is noted. IMPRESSION: No evidence of deep venous thrombosis. Electronically Signed   By: Inez Catalina M.D.   On: 02/22/2021 20:44   DG Chest Port 1 View  Result Date: 02/22/2021 CLINICAL DATA:  Lethargy x3 days. EXAM: PORTABLE CHEST 1 VIEW COMPARISON:  February 10, 2021 FINDINGS: The lungs are mildly hyperinflated. Chronic appearing increased lung markings are seen within the bilateral lower lobes. There is no evidence of acute infiltrate, pleural effusion or pneumothorax. The heart size and mediastinal contours are within normal limits. The visualized skeletal structures are unremarkable. IMPRESSION: No acute or active cardiopulmonary disease. Electronically Signed   By: Virgina Norfolk M.D.   On: 02/22/2021 18:13               LOS: 0 days   Tymeer Vaquera  [facility]   Pager on [email or web address]. If 7PM-7AM, please contact night-coverage at [email or web address]     02/23/2021, 11:25 AM

## 2021-02-23 NOTE — ED Notes (Signed)
[facility] aware of ready bed at [facility].

## 2021-02-23 NOTE — Progress Notes (Signed)
Patient reports taking xanax 0.5 mg BID for anxiety. Provider contacted. V.O received to give xanax 0.5 mg BID prn for anxiety.

## 2021-02-23 NOTE — Progress Notes (Signed)
  Echocardiogram 2D Echocardiogram has been performed.  Holly [name] 02/23/2021, 3:16 PM

## 2021-02-23 NOTE — H&P (Signed)
History and Physical    Holly [name] TDD:[id number] DOB: 04-Mar-1946 DOA: 02/22/2021  PCP: Harlan Stains, MD   Patient coming from:   Home  Chief Complaint: fatigue and weakness.   HPI: Holly [name] is a 75 y.o. female with medical history significant for HTN, COPD, CKD 3, recent GI bleed that required blood transfusion who presents from [facility] ER with complaint of fatigue and weakness the past 3 days. She was found to have hypotension and AKI on CKD3. Blood pressure improved in the ER with IVF bolus of NS 750 ml. She was also given stress dose of steroid as she is on steroids chronically at home and presented with low BP. She reports that she was fatigued and sleeping more yesterday so her daughter made her go to PCP to be evaluated.  She was found to be hypotensive and sent to the emergency room.  She denies any abdominal pain or blood from rectum.  Had any nausea or vomiting.  She reports that the melena has resolved since discharge.  She reports having 1 or 2 bouts of diarrhea but did not have blood or mucus in it.  Does not have any fever, nausea vomiting.  She denies having any chest pain or palpitations.  She reports she has not had any worsening of her chronic shortness of breath or cough.  She uses oxygen at home 24/7.   ED Course: In the emergency room patient had blood pressures in the 79-100/42-60 range.  She was given IV fluids bolus with normal saline and blood pressure improved to the 90-102/48-50 range.  Mildly decreased at 3.0.  Creatinine was elevated at 1.42 from baseline of 1.1.  BUN was 16.  GFR 38.  Other electrolytes were normal.  WBC of 10,600, hemoglobin is 11 hematocrit 36.7 platelets 193.  0.89.  She had Doppler ultrasound of legs and no DVT was identified.  CT angiography was obtained and no pulmonary embolism was identified. Hospital service was asked to observe patient overnight for further management  Review of Systems:  General: Reports fatigue and generalized  weakness. Denies fever, chills, weight loss, night sweats. Denies dizziness.  Denies change in appetite HENT: Denies head trauma, headache, denies change in hearing, tinnitus. Denies nasal bleeding. Denies sore throat, sores in mouth.  Denies difficulty swallowing Eyes: Denies blurry vision, pain in eye, drainage.  Denies discoloration of eyes. Neck: Denies pain.  Denies swelling.  Denies pain with movement. Cardiovascular: Denies chest pain, palpitations.  Denies edema.  Denies orthopnea Respiratory: Denies shortness of breath, cough.  Denies wheezing.  Denies sputum production Gastrointestinal: Denies abdominal pain, swelling. Denies nausea, vomiting, diarrhea. Denies hematemesis. Musculoskeletal: Denies limitation of movement.  Denies deformity or swelling. Denies arthralgias or myalgias. Genitourinary: Denies pelvic pain.  Denies urinary frequency or hesitancy.  Denies dysuria.  Skin: Denies rash.  Denies petechiae, purpura, ecchymosis. Neurological: Denies syncope. Denies seizure activity. Denies paresthesia.  Denies slurred speech, drooping face.  Denies visual change. Psychiatric: Denies depression, anxiety. Denies hallucinations.  Past Medical History:  Diagnosis Date  . Anxiety   . Asthma   . Basal cell carcinoma    lung cancer.  Skin cancer- basal - nose removed  . Bruises easily   . Cataract   . Cervicalgia   . 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 Dyspnea    uses O2 only when needed. Uses 2 L   . Foot swelling    left- has shown to  Drs.  . Dan Europe of urination   . Headache   . 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 Hypercalcemia   . Hypertension   . Hypothyroidism   . Mixed hyperlipidemia   . Pneumonia   . Pulmonary nodule   . Sleep apnea    does not wear cpap  . Thyroid neoplasm   . Urinary incontinence   . Wears dentures   . Wears glasses     Past Surgical History:  Procedure Laterality Date  . BIOPSY   02/17/2021   Procedure: BIOPSY;  Surgeon: Otis Brace, MD;  Location: [facility];  Service: Gastroenterology;;  . COLONOSCOPY    . DENTAL SURGERY    . ELBOW ARTHROPLASTY Right 2020  . ESOPHAGOGASTRODUODENOSCOPY (EGD) WITH PROPOFOL N/A 02/17/2021   Procedure: ESOPHAGOGASTRODUODENOSCOPY (EGD) WITH PROPOFOL;  Surgeon: Otis Brace, MD;  Location: [place];  Service: Gastroenterology;  Laterality: N/A;  . EYE SURGERY Right    cataract surgery  . FOOT SURGERY  30 YRS AGO   BILATERAL  . HARDWARE REMOVAL Right 10/02/2020   Procedure: HARDWARE REMOVAL RIGHT ELBOW;  Surgeon: Shona Needles, MD;  Location: [place];  Service: Orthopedics;  Laterality: Right;  . I & D EXTREMITY Right 01/13/2021   Procedure: IRRIGATION AND DEBRIDEMENT RIGHT ELBOW;  Surgeon: Shona Needles, MD;  Location: [place];  Service: Orthopedics;  Laterality: Right;  . ORIF ELBOW FRACTURE Right 12/06/2017   Procedure: OPEN REDUCTION INTERNAL FIXATION (ORIF) ELBOW/OLECRANON FRACTURE;  Surgeon: Shona Needles, MD;  Location: [place];  Service: Orthopedics;  Laterality: Right;  . SKIN CANCER EXCISION    . THYROIDECTOMY N/A 07/31/2014   Procedure: TOTAL THYROIDECTOMY;  Surgeon: Armandina Gemma, MD;  Location: [facility];  Service: General;  Laterality: N/A;  . VAGINAL HYSTERECTOMY    . VIDEO BRONCHOSCOPY WITH ENDOBRONCHIAL NAVIGATION Bilateral 06/25/2019   Procedure: VIDEO BRONCHOSCOPY WITH ENDOBRONCHIAL NAVIGATION;  Surgeon: Lajuana Matte, MD;  Location: [facility];  Service: Thoracic;  Laterality: Bilateral;    Social History  reports that she quit smoking about 6 years ago. Her smoking use included cigarettes. She has a 30.00 pack-year smoking history. She has never used smokeless tobacco. She reports current alcohol use. She reports that she does not use drugs.  Allergies  Allergen Reactions  . Amlodipine Besylate Other (See Comments)    Other reaction(s): edema  . Codeine Nausea And Vomiting  . Oxycodone-Acetaminophen  Nausea And Vomiting    Family History  Problem Relation Age of Onset  . Coronary artery disease Father   . Hypertension Father   . Asthma Father   . Allergies Father   . Hypertension Mother   . Lung cancer Mother      Prior to Admission medications   Medication Sig Start Date End Date Taking? Authorizing Provider  ALPRAZolam Duanne Moron) 0.5 MG tablet Take 0.5 mg by mouth 2 (two) times daily as needed for anxiety. 09/14/20   [provider]  aspirin EC 81 MG tablet Take 81 mg by mouth daily. Swallow whole.    [provider]  atorvastatin (LIPITOR) 20 MG tablet Take 20 mg by mouth at bedtime.     [provider]  buPROPion (WELLBUTRIN XL) 300 MG 24 hr tablet Take 300 mg by mouth daily.    [provider]  busPIRone (BUSPAR) 30 MG tablet Take 30 mg by mouth 2 (two) times daily.    [provider]  cholecalciferol (VITAMIN D3) 25 MCG (1000 UNIT)  tablet Take 1,000 Units by mouth daily.    [provider]  Dextromethorphan-guaiFENesin (MUCINEX DM MAXIMUM STRENGTH PO) Take 15 mLs by mouth every 4 (four) hours as needed (cough/cold symptoms).    [provider]  donepezil (ARICEPT) 10 MG tablet Take 10 mg by mouth at bedtime. 12/17/20   [provider]  Fluticasone-Umeclidin-Vilant 100-62.5-25 MCG/INH AEPB Inhale 1 puff into the lungs daily. Trelegy    [provider]  furosemide (LASIX) 20 MG tablet Take 20 mg by mouth daily. 12/18/20   [provider]  ipratropium-albuterol (DUONEB) 0.5-2.5 (3) MG/3ML SOLN Inhale 3 mLs into the lungs every 4 (four) hours as needed (respiratory issues.). 01/29/20   [provider]  levothyroxine (SYNTHROID) 137 MCG tablet Take 137 mcg by mouth daily before breakfast. 09/22/20   [provider]  metoprolol succinate (TOPROL-XL) 50 MG 24 hr tablet Take 50 mg by mouth daily. Take with or immediately following a meal.    [provider]  ondansetron (ZOFRAN)  8 MG tablet Take 8 mg by mouth every 8 (eight) hours as needed for nausea or vomiting. 02/09/21   [provider]  pantoprazole (PROTONIX) 40 MG tablet Take 1 tablet (40 mg total) by mouth 2 (two) times daily. 02/18/21 04/20/21  Dessa Phi, DO  predniSONE (DELTASONE) 10 MG tablet Take 20 mg by mouth daily with breakfast.    [provider]  traMADol (ULTRAM) 50 MG tablet Take 1 tablet (50 mg total) by mouth every 6 (six) hours as needed for severe pain. 01/13/21   Delray Alt, PA-C  VENTOLIN HFA 108 (90 Base) MCG/ACT inhaler INHALE 2 PUFFS INTO THE LUNGS EVERY 4 HOURS AS NEEDED FOR WHEEZING ORSHORTNESS OF BREATH Patient taking differently: Inhale 2 puffs into the lungs every 4 (four) hours as needed for wheezing or shortness of breath. 06/29/17   Melvenia Needles, NP    Physical Exam: Vitals:   02/23/21 0115 02/23/21 0130 02/23/21 0145 02/23/21 0239  BP: (!) 94/48 (!) 102/48  110/62  Pulse:    73  Resp: (!) 23 (!) 22 (!) 22 20  Temp:    98.4 F (36.9 C)  TempSrc:    Oral  SpO2:    100%    Constitutional: NAD, calm, comfortable Vitals:   02/23/21 0115 02/23/21 0130 02/23/21 0145 02/23/21 0239  BP: (!) 94/48 (!) 102/48  110/62  Pulse:    73  Resp: (!) 23 (!) 22 (!) 22 20  Temp:    98.4 F (36.9 C)  TempSrc:    Oral  SpO2:    100%   General: WDWN, Alert and oriented x3.  Eyes: EOMI, PERRL, conjunctivae normal.  Sclera nonicteric HENT:  [place]/AT, external ears normal.  Nares patent without epistasis.  Mucous membranes are moist.  Neck: Soft, normal range of motion, supple, no masses, Trachea midline Respiratory: Equal breath sounds. Mild rales at bases bilaterally, mild wheezing with forced expiration, no crackles. Normal respiratory effort. No accessory muscle use.  Cardiovascular: Regular rate and rhythm, no murmurs / rubs / gallops. Has lower extremity edema. 2+ pedal pulses.  Abdomen: Soft, no tenderness, nondistended, no rebound or guarding. Obese. No masses  palpated. Bowel sounds normoactive Musculoskeletal: FROM. no cyanosis. No joint deformity upper and lower extremities. Normal muscle tone.  Skin: Warm, dry, intact no rashes, lesions, ulcers. No induration Neurologic: CN 2-12 grossly intact. Normal speech. Sensation intact. Strength 5/5 in all extremities.   Psychiatric: Normal judgment and insight.  Normal mood.  Labs on Admission: I have personally reviewed following labs and imaging studies  CBC: Recent Labs  Lab 02/17/21 0120 02/17/21 1635 02/18/21 0540 02/22/21 1753  WBC 7.4 7.5 6.3 10.6*  NEUTROABS  --   --   --  6.3  HGB 9.5* 9.7* 9.7* 11.0*  HCT 29.3* 30.8* 31.7* 36.7  MCV 92.7 93.9 96.6 98.9  PLT 120* 125* 121* 086    Basic Metabolic Panel: Recent Labs  Lab 02/17/21 0120 02/18/21 0540 02/22/21 1753  NA 145 143 144  K 4.0 3.7 3.0*  CL 106 104 93*  CO2 34* 33* 44*  GLUCOSE 106* 83 103*  BUN 23 19 16   CREATININE 1.01* 1.10* 1.42*  CALCIUM 8.8* 9.3 9.7  MG 1.6*  --   --   PHOS 2.8  --   --     GFR: Estimated Creatinine Clearance: 30.2 mL/min (A) (by C-G formula based on SCr of 1.42 mg/dL (H)).  Liver Function Tests: Recent Labs  Lab 02/17/21 0120 02/18/21 0540 02/22/21 1753  AST 22 23 16   ALT 15 15 13   ALKPHOS 47 57 73  BILITOT 0.4 0.7 0.5  PROT 4.3* 4.7* 5.9*  ALBUMIN 2.0* 2.2* 3.3*    Urine analysis: No results found for: COLORURINE, APPEARANCEUR, LABSPEC, PHURINE, GLUCOSEU, HGBUR, BILIRUBINUR, KETONESUR, PROTEINUR, UROBILINOGEN, NITRITE, LEUKOCYTESUR  Radiological Exams on Admission: CT Angio Chest PE W/Cm &/Or Wo Cm  Result Date: 02/22/2021 CLINICAL DATA:  PE suspected, low/intermediate prob, positive D-dimer Shortness of breath. Radiologic records states history of lung cancer. EXAM: CT ANGIOGRAPHY CHEST WITH CONTRAST TECHNIQUE: Multidetector CT imaging of the chest was performed using the standard protocol during bolus administration of intravenous contrast. Multiplanar CT image  reconstructions and MIPs were obtained to evaluate the vascular anatomy. CONTRAST:  72mL OMNIPAQUE IOHEXOL 350 MG/ML SOLN COMPARISON:  Radiograph earlier this day.  Chest CT 11/17/2020 FINDINGS: Cardiovascular: There are no filling defects within the pulmonary arteries to suggest pulmonary embolus. Atherosclerosis of the thoracic aorta. No evidence of dissection or acute aortic findings. Normal heart size with coronary artery calcifications. Mediastinum/Nodes: No enlarged mediastinal or hilar lymph nodes. Esophagus minimally patulous but no wall thickening. Thyroidectomy. Lungs/Pleura: Moderate emphysema. Post treatment scarring and architectural distortion of the paramediastinal anterior left upper lobe. This is not significantly changed in appearance from prior exam. Spiculated right middle lobe nodule measures 10 x 9 mm, series 6, image 71, not significantly changed in the interim. Bronchial thickening with occasional areas of mucous plugging in the lower lobes. No new pulmonary nodule or acute airspace disease. No pleural fluid. Upper Abdomen: No acute or unexpected findings. Musculoskeletal: No acute osseous abnormality or focal osseous lesion. Mild thoracic spondylosis. No chest wall soft tissue abnormality. Review of the MIP images confirms the above findings. IMPRESSION: 1. No pulmonary embolus or acute intrathoracic abnormality. 2. Post treatment scarring and architectural distortion in the paramediastinal anterior left upper lobe, unchanged from prior exam. 3. Spiculated right middle lobe nodule is unchanged in size from prior exam, 10 x 9 mm. 4. Aortic atherosclerosis.  Coronary artery calcifications. 5. Emphysema. Aortic Atherosclerosis (ICD10-I70.0) and Emphysema (ICD10-J43.9). Electronically Signed   By: Keith Rake M.D.   On: 02/22/2021 22:25   US Venous Img Lower Unilateral Left  Result Date: 02/22/2021 CLINICAL DATA:  Lower extremity edema EXAM: Left LOWER EXTREMITY VENOUS DOPPLER ULTRASOUND  TECHNIQUE: Gray-scale sonography with graded compression, as well as color Doppler and duplex ultrasound were performed to evaluate the lower extremity deep venous systems from the level  of the common femoral vein and including the common femoral, femoral, profunda femoral, popliteal and calf veins including the posterior tibial, peroneal and gastrocnemius veins when visible. The superficial great saphenous vein was also interrogated. Spectral Doppler was utilized to evaluate flow at rest and with distal augmentation maneuvers in the common femoral, femoral and popliteal veins. COMPARISON:  None. FINDINGS: Contralateral Common Femoral Vein: Respiratory phasicity is normal and symmetric with the symptomatic side. No evidence of thrombus. Normal compressibility. Common Femoral Vein: No evidence of thrombus. Normal compressibility, respiratory phasicity and response to augmentation. Saphenofemoral Junction: No evidence of thrombus. Normal compressibility and flow on color Doppler imaging. Profunda Femoral Vein: No evidence of thrombus. Normal compressibility and flow on color Doppler imaging. Femoral Vein: No evidence of thrombus. Normal compressibility, respiratory phasicity and response to augmentation. Popliteal Vein: No evidence of thrombus. Normal compressibility, respiratory phasicity and response to augmentation. Calf Veins: No evidence of thrombus. Normal compressibility and flow on color Doppler imaging. Superficial Great Saphenous Vein: No evidence of thrombus. Normal compressibility. Venous Reflux:  None. Other Findings:  Calf edema is noted. IMPRESSION: No evidence of deep venous thrombosis. Electronically Signed   By: Inez Catalina M.D.   On: 02/22/2021 20:44   DG Chest Port 1 View  Result Date: 02/22/2021 CLINICAL DATA:  Lethargy x3 days. EXAM: PORTABLE CHEST 1 VIEW COMPARISON:  February 10, 2021 FINDINGS: The lungs are mildly hyperinflated. Chronic appearing increased lung markings are seen within the  bilateral lower lobes. There is no evidence of acute infiltrate, pleural effusion or pneumothorax. The heart size and mediastinal contours are within normal limits. The visualized skeletal structures are unremarkable. IMPRESSION: No acute or active cardiopulmonary disease. Electronically Signed   By: Virgina Norfolk M.D.   On: 02/22/2021 18:13    EKG: Independently reviewed.  EKG shows normal sinus rhythm with no acute ST elevation or depression.  QTc 426  Assessment/Plan Principal Problem:   Acute renal failure superimposed on stage 3a chronic kidney disease  Ms. Barkan has increase in creatinine level to 1.42 from 1.1 last week.  Most likely due from the hypotension and volume depletion. Patient was given IV fluid bolus in the emergency room with normal saline. Continue LR at 100 ml/hr overnight. Recheck electrolytes and renal function in morning with labs.  Active Problems:   Hypotension Blood pressure improved after IV fluid bolus in the emergency room.  We will continue with LR for IV fluids overnight.  Monitor blood pressure. Patient placed on fall precautions and is only to get up with assistance.    COPD GOLD III  Continue home trilogy dose.  Continue albuterol MDI as needed.  DuoNebs provided as needed for shortness of breath, cough, wheeze Incentive spirometer every 2 hours while awake    Hypokalemia Potassium is low at 3.0.  Patient was given oral potassium supplementation in the emergency room.  Magnesium was not checked.  Magnesium level has been ordered now and if is low will replete.  Potassium we recheck her labs in the morning and if still low will replete.    DVT prophylaxis: SCD's for DVT prophylaxis. Pt had recent GI bleed so anticoagulation not given.  Code Status:   Full code  Family Communication:  Diagnosis and plan discussed with patient.  She verbalized understanding agrees with plan.  Further recommendations to follow as clinically indicated Disposition  Plan:   Patient is from:  Home  Anticipated DC to:  Home  Anticipated DC date:  Anticipate less than 2  midnight stay  Anticipated DC barriers: No barriers to discharge identified at this time   Admission status:  Observation.    Yevonne Aline Latoiya Maradiaga MD [facility]  How to contact the [facility] Attending or Consulting provider [place] or covering provider during after hours [place], for this patient?   1. Check the care team in [facility] and look for a) attending/consulting [facility] provider listed and b) the [facility] team listed 2. Log into [email or web address] and use [place]'s universal password to access. If you do not have the password, please contact the hospital operator. 3. Locate the [facility] provider you are looking for under [facility] and page to a number that you can be directly reached. 4. If you still have difficulty reaching the provider, please page the [facility] (Director on Call) for the Hospitalists listed on amion for assistance.  02/23/2021, 3:00 AM

## 2021-02-23 NOTE — Care Management Obs Status (Signed)
[place] NOTIFICATION   Patient Details  Name: TEKELIA KAREEM MRN: [id number] Date of Birth: 09/10/1946   Medicare Observation Status Notification Given:  Yes    MahabirJuliann Pulse, RN 02/23/2021, 1:44 PM

## 2021-02-24 LAB — CBC WITH DIFFERENTIAL/PLATELET
Abs Immature Granulocytes: 0.02 10*3/uL (ref 0.00–0.07)
Basophils Absolute: 0 10*3/uL (ref 0.0–0.1)
Basophils Relative: 0 %
Eosinophils Absolute: 0 10*3/uL (ref 0.0–0.5)
Eosinophils Relative: 0 %
HCT: 29.5 % — ABNORMAL LOW (ref 36.0–46.0)
Hemoglobin: 8.9 g/dL — ABNORMAL LOW (ref 12.0–15.0)
Immature Granulocytes: 0 %
Lymphocytes Relative: 30 %
Lymphs Abs: 2.2 10*3/uL (ref 0.7–4.0)
MCH: 29.7 pg (ref 26.0–34.0)
MCHC: 30.2 g/dL (ref 30.0–36.0)
MCV: 98.3 fL (ref 80.0–100.0)
Monocytes Absolute: 0.6 10*3/uL (ref 0.1–1.0)
Monocytes Relative: 8 %
Neutro Abs: 4.5 10*3/uL (ref 1.7–7.7)
Neutrophils Relative %: 62 %
Platelets: 136 10*3/uL — ABNORMAL LOW (ref 150–400)
RBC: 3 MIL/uL — ABNORMAL LOW (ref 3.87–5.11)
RDW: 17.7 % — ABNORMAL HIGH (ref 11.5–15.5)
WBC: 7.2 10*3/uL (ref 4.0–10.5)
nRBC: 0 % (ref 0.0–0.2)

## 2021-02-24 LAB — BASIC METABOLIC PANEL
Anion gap: 10 (ref 5–15)
BUN: 17 mg/dL (ref 8–23)
CO2: 37 mmol/L — ABNORMAL HIGH (ref 22–32)
Calcium: 9.3 mg/dL (ref 8.9–10.3)
Chloride: 94 mmol/L — ABNORMAL LOW (ref 98–111)
Creatinine, Ser: 0.92 mg/dL (ref 0.44–1.00)
GFR, Estimated: >60 mL/min (ref >60)
Glucose, Bld: 106 mg/dL — ABNORMAL HIGH (ref 70–99)
Potassium: 3.5 mmol/L (ref 3.5–5.1)
Sodium: 141 mmol/L (ref 135–145)

## 2021-02-24 LAB — MAGNESIUM: Magnesium: 1.7 mg/dL (ref 1.7–2.4)

## 2021-02-24 MED ORDER — SENNOSIDES-DOCUSATE SODIUM 8.6-50 MG PO TABS: 1.0000 | ORAL_TABLET | Freq: Every evening | ORAL | Status: DC | PRN

## 2021-02-24 MED ORDER — HYDRALAZINE HCL 20 MG/ML IJ SOLN: 10.0000 mg | INTRAMUSCULAR | Status: DC | PRN

## 2021-02-24 NOTE — Progress Notes (Signed)
Occupational Therapy Evaluation  Patient lives with her daughter in a mobile home, getting ramp installed but currently has 4 steps to enter. Patient has aide that comes from 8-12pm, DTR returns home from work at [employer]. At baseline patient reports independence with basic ADLs using walker and on 3L O2. Currently patient presents with decreased activity tolerance, reporting weakness and fatigue after ~3 mins standing at sink for grooming/hygiene and using toilet. Patient also with poor safety awareness forgetting her walker at sink twice and needing cues not to pull on walker to stand. Patient plan to start [facility] services at D/C which she was unable to start since last hospitalization "I ended up back here." Will continue with acute OT to facilitate D/C home.     02/24/21 1100  OT Visit Information  Last OT Received On 02/24/21  Assistance Needed +1  History of Present Illness Holly [name] is a 75 yo female presents with c/o fatigue and generalized weakness. Pt previously admitted with GI bleed, received blood. LLE negative for DVT, chest CT negative for PE. PMH: anxiety, cervicalgia, COPD, CKD, HTN, chronic hypoxic respiratory failure on 3L O2, R elbow ORIF 11/2017  Precautions  Precautions Fall  Restrictions  Weight Bearing Restrictions No  Home Living  Family/patient expects to be discharged to: Private residence  Living Arrangements Children;Other (Comment) (DTR works and friend to stay with patient)  Available Help at Discharge Family;Available 24 hours/day;Personal care attendant (DTR works until [employer], aide present 8-12pm)  Type of Home Mobile home  [place] to enter;Other (comment) (ramp getting installed)  Entrance Stairs-Number of Steps 4  Entrance Stairs-Rails Right;Left;Can reach both  Home Layout One level  Bathroom Shower/Tub Walk-in shower  [place] seat;Cane - single point;Walker - 4 wheels;Grab bars -  tub/shower;Wheelchair - manual;Other (comment) (home O2 3L, pulse ox)  Prior Function  Level of Independence Needs assistance  Gait / Transfers Assistance Needed household distance using rollator  ADL's / Homemaking Assistance Needed pt reports ind with bathing, dressing (sometimes needs help with socks); aide and daughter complete household chores  Comments pt reports was supposed to start [facility] but then was hospitalized again  Communication  Communication No difficulties  Pain Assessment  Pain Assessment Faces  Faces Pain Scale 4  Pain Location buttock  Pain Descriptors / Indicators Tender;Sore  Pain Intervention(s) Monitored during session;Repositioned  Cognition  Arousal/Alertness Awake/alert  Behavior During Therapy WFL for tasks assessed/performed  Overall Cognitive Status No family/caregiver present to determine baseline cognitive functioning  General Comments patient needing cues for safety forgetting her walker at sink, asking for multiple items that are directly in front of her. unsure if patient groggy/sleepy was napping upon arrival  Upper Extremity Assessment  Upper Extremity Assessment Generalized weakness  Lower Extremity Assessment  Lower Extremity Assessment Defer to PT evaluation  ADL  Overall ADL's  Needs assistance/impaired  Grooming Oral care;Wash/dry face;Wash/dry hands;Min guard;Standing  Grooming Details (indicate cue type and reason) min G for safety, leans heavily onto sink for support. fatigue with ~3 min standing  Upper Body Bathing Set up;Sitting  Lower Body Bathing Minimal assistance;Sit to/from stand;Sitting/lateral leans  Upper Body Dressing  Set up;Sitting  Lower Body Dressing Minimal assistance;Sitting/lateral leans;Sit to/from Environmental education officer guard;Cueing for safety;Regular Toilet;RW;Grab bars  Armed forces technical officer Details (indicate cue type and reason) patient pulling on walker to stand, cue to use grab bar.  Toileting- Water quality scientist  and Hygiene Supervision/safety;Sitting/lateral lean  Toileting - Clothing  Manipulation Details (indicate cue type and reason) peri care after voiding  Functional mobility during ADLs Min guard;Cueing for safety;Rolling walker  General ADL Comments patient reports feeling weak limiting her overall activity tolerance.  Bed Mobility  General bed mobility comments in chair  Transfers  Overall transfer level Needs assistance  Equipment used Rolling walker (2 wheeled)  Transfers Sit to/from Stand  Sit to Stand Min guard  General transfer comment needs verbal cues for body mechanics, min G for safety  Balance  Overall balance assessment Needs assistance  Sitting-balance support Feet supported  Sitting balance-Leahy Scale Good  Standing balance support Bilateral upper extremity supported;During functional activity  Standing balance-Leahy Scale Poor  Standing balance comment leans heavily onto sink during g/h  OT - End of Session  Equipment Utilized During Treatment Rolling walker;Oxygen  Activity Tolerance Patient limited by fatigue  Patient left in chair;with call bell/phone within reach  Nurse Communication Mobility status  OT Assessment  OT Recommendation/Assessment Patient needs continued OT Services  OT Visit Diagnosis Other abnormalities of gait and mobility (R26.89);Pain;Muscle weakness (generalized) (M62.81)  Pain - part of body  (buttock)  OT Problem List Decreased strength;Decreased activity tolerance;Decreased safety awareness;Pain  OT Plan  OT Frequency (ACUTE ONLY) Min 2X/week  OT Treatment/Interventions (ACUTE ONLY) Self-care/ADL training;Balance training;Patient/family education;Therapeutic activities;Therapeutic exercise  AM-PAC OT "6 Clicks" Daily Activity Outcome Measure (Version 2)  Help from another person eating meals? 4  Help from another person taking care of personal grooming? 3  Help from another person toileting, which includes using toliet, bedpan, or urinal? 3   Help from another person bathing (including washing, rinsing, drying)? 3  Help from another person to put on and taking off regular upper body clothing? 3  Help from another person to put on and taking off regular lower body clothing? 3  6 Click Score 19  OT Recommendation  Follow Up Recommendations Home health OT;Supervision/Assistance - 24 hour (at least initial)  OT Equipment None recommended by OT  Individuals Consulted  Consulted and Agree with Results and Recommendations Patient  Acute Rehab OT Goals  Patient Stated Goal return home and start home health therapy  OT Goal Formulation With patient  Time For Goal Achievement 03/10/21  Potential to Achieve Goals Good  OT Time Calculation  OT Start Time (ACUTE ONLY) 1035  OT Stop Time (ACUTE ONLY) 1059  OT Time Calculation (min) 24 min  OT General Charges  $OT Visit 1 Visit  OT Evaluation  $OT Eval Low Complexity 1 Low  OT Treatments  $Self Care/Home Management  8-22 mins  Written Expression  Dominant Hand Right   Delbert Phenix OT OT pager: [phone]

## 2021-02-24 NOTE — Progress Notes (Signed)
PROGRESS NOTE    Holly [name]  HEN:[id number] DOB: 1945-11-22 DOA: 02/22/2021 PCP: Harlan Stains, MD   Brief Narrative:  75 y.o. female with medical history significant for multiple comorbidities including but not limited to hypertension, COPD, chronic hypoxic respiratory failure on 3 L/min oxygen, CKD stage IIIa, hypothyroidism, hyperlipidemia, thyroid neoplasm, basal cell cancer, recent GI bleed requiring blood transfusion, presented to the hospital because of generalized weakness and fatigue of about 3 days duration.  She was found to have hypotension (BP was 79/60), AKI on CKD stage IIIa.  She was treated with IV fluids.  It is suspected that patient has underlying CHF and developed acute on chronic CHF.  2D echo is pending.   Assessment & Plan:   Principal Problem:   Acute renal failure superimposed on stage 3a chronic kidney disease (HCC) Active Problems:   COPD GOLD III    Hypotension   Hypokalemia  Acute COPD exacerbation with chronic hypoxic respiratory failure - Patient does have mild coarse breath sounds.  No wheezing noted therefore will hold off on steroids.  Continue scheduled and as needed bronchodilators.  Incentive spirometer and flutter valve.  Hypotension: Resolved with IV fluids  Peripheral edema, fluid overload Acute diastolic congestive heart failure, class III.  Preserved ejection fraction 60% - We will continue Lasix IV twice daily today.  Overall her lungs appear to be okay but she is having some right-sided heart failure symptoms. -Elevate legs.  Compression stockings.  AKI on CKD stage IIIa:  Creatinine is better 0.92 from 1.15.  Monitor BMP closely.  COPD with chronic hypoxic respiratory failure:  Continue bronchodilators and 3 L/min oxygen via nasal cannula   Spoke with her daughter over the phone Platelet dropped from [phone].  Currently stable without any evidence of bleeding    DVT prophylaxis: SCDs Code Status: Full Code Family  Communication:    Status is: Inpatient  Remains inpatient appropriate because:Inpatient level of care appropriate due to severity of illness   Dispo: The patient is from: Home              Anticipated d/c is to: Home              Patient currently is not medically stable to d/c. Still needs IV Lasix due to fluid overload.  I am hoping to discharge her tomorrow   Difficult to place patient No    There is no height or weight on file to calculate BMI.  Pressure Injury 02/16/21 Buttocks Right Stage 2 -  Partial thickness loss of dermis presenting as a shallow open injury with a red, pink wound bed without slough. (Active)  02/16/21 0230  Location: Buttocks  Location Orientation: Right  Staging: Stage 2 -  Partial thickness loss of dermis presenting as a shallow open injury with a red, pink wound bed without slough.  Wound Description (Comments):   Present on Admission: Yes    Subjective: Sitting up in the chair. Feels tired with some exertional dyspnea.  She also reports of bilateral lower extremity swelling. I spoke with the patient's daughter over the phone who tells me patient is quite noncompliant with her medication and the instructions given to her.  Review of Systems Otherwise negative except as per HPI, including: General: Denies fever, chills, night sweats or unintended weight loss. Resp: Denies cough, wheezing, shortness of breath. Cardiac: Denies chest pain, palpitations, orthopnea, paroxysmal nocturnal dyspnea. GI: Denies abdominal pain, nausea, vomiting, diarrhea or constipation GU: Denies dysuria, frequency, hesitancy or incontinence  MS: Denies muscle aches, joint pain or swelling Neuro: Denies headache, neurologic deficits (focal weakness, numbness, tingling), abnormal gait Psych: Denies anxiety, depression, SI/HI/AVH Skin: Denies new rashes or lesions ID: Denies sick contacts, exotic exposures, travel  Examination:  General exam: Appears calm and comfortable   Respiratory system: Clear to auscultation. Respiratory effort normal. Cardiovascular system: S1 & S2 heard, RRR. No JVD, murmurs, rubs, gallops or clicks. No pedal edema. Gastrointestinal system: Abdomen is nondistended, soft and nontender. No organomegaly or masses felt. Normal bowel sounds heard. Central nervous system: Alert and oriented. No focal neurological deficits. Extremities: Symmetric 5 x 5 power.  3+ lower extremity bilateral pitting edema Skin: No rashes, lesions or ulcers Psychiatry: Judgement and insight appear normal. Mood & affect appropriate.     Objective: Vitals:   02/24/21 0429 02/24/21 0600 02/24/21 0721 02/24/21 1311  BP: (!) 113/54   134/65  Pulse: 65   75  Resp: 20   20  Temp: 97.8 F (36.6 C)   98.3 F (36.8 C)  TempSrc: Oral     SpO2: 94% 96% 98% 94%    Intake/Output Summary (Last 24 hours) at 02/24/2021 1454 Last data filed at 02/24/2021 1000 Gross per 24 hour  Intake 420 ml  Output 750 ml  Net -330 ml   There were no vitals filed for this visit.   Data Reviewed:   CBC: Recent Labs  Lab 02/17/21 1635 02/18/21 0540 02/22/21 1753 02/23/21 0502 02/24/21 0449  WBC 7.5 6.3 10.6* 6.1 7.2  NEUTROABS  --   --  6.3  --  4.5  HGB 9.7* 9.7* 11.0* 9.2* 8.9*  HCT 30.8* 31.7* 36.7 30.6* 29.5*  MCV 93.9 96.6 98.9 98.7 98.3  PLT 125* 121* 193 128* 892*   Basic Metabolic Panel: Recent Labs  Lab 02/18/21 0540 02/22/21 1753 02/23/21 0502 02/24/21 0449  NA 143 144 141 141  K 3.7 3.0* 3.3* 3.5  CL 104 93* 96* 94*  CO2 33* 44* 40* 37*  GLUCOSE 83 103* 160* 106*  BUN 19 16 16 17   CREATININE 1.10* 1.42* 1.15* 0.92  CALCIUM 9.3 9.7 9.2 9.3  MG  --   --  1.9 1.7   GFR: Estimated Creatinine Clearance: 46.6 mL/min (by C-G formula based on SCr of 0.92 mg/dL). Liver Function Tests: Recent Labs  Lab 02/18/21 0540 02/22/21 1753  AST 23 16  ALT 15 13  ALKPHOS 57 73  BILITOT 0.7 0.5  PROT 4.7* 5.9*  ALBUMIN 2.2* 3.3*   No results for  input(s): LIPASE, AMYLASE in the last 168 hours. No results for input(s): AMMONIA in the last 168 hours. Coagulation Profile: No results for input(s): INR, PROTIME in the last 168 hours. Cardiac Enzymes: No results for input(s): CKTOTAL, CKMB, CKMBINDEX, TROPONINI in the last 168 hours. BNP (last 3 results) No results for input(s): PROBNP in the last 8760 hours. HbA1C: No results for input(s): HGBA1C in the last 72 hours. CBG: Recent Labs  Lab 02/22/21 1800  GLUCAP 104*   Lipid Profile: No results for input(s): CHOL, HDL, LDLCALC, TRIG, CHOLHDL, LDLDIRECT in the last 72 hours. Thyroid Function Tests: No results for input(s): TSH, T4TOTAL, FREET4, T3FREE, THYROIDAB in the last 72 hours. Anemia Panel: No results for input(s): VITAMINB12, FOLATE, FERRITIN, TIBC, IRON, RETICCTPCT in the last 72 hours. Sepsis Labs: No results for input(s): PROCALCITON, LATICACIDVEN in the last 168 hours.  Recent Results (from the past 240 hour(s))  MRSA PCR Screening     Status: None  Collection Time: 02/16/21  3:05 PM   Specimen: Nasopharyngeal  Result Value Ref Range Status   MRSA by PCR NEGATIVE NEGATIVE Final    Comment:        The GeneXpert MRSA Assay (FDA approved for NASAL specimens only), is one component of a comprehensive MRSA colonization surveillance program. It is not intended to diagnose MRSA infection nor to guide or monitor treatment for MRSA infections. Performed at [facility], [place] [address], [place], [place] [address]   Resp Panel by RT-PCR (Flu A&B, Covid) Nasopharyngeal Swab     Status: None   Collection Time: 02/22/21  5:37 PM   Specimen: Nasopharyngeal Swab; Nasopharyngeal(NP) swabs in vial transport medium  Result Value Ref Range Status   SARS Coronavirus 2 by RT PCR NEGATIVE NEGATIVE Final    Comment: (NOTE) SARS-CoV-2 target nucleic acids are NOT DETECTED.  The SARS-CoV-2 RNA is generally detectable in upper respiratory specimens during the acute phase  of infection. The lowest concentration of SARS-CoV-2 viral copies this assay can detect is 138 copies/mL. A negative result does not preclude SARS-Cov-2 infection and should not be used as the sole basis for treatment or other patient management decisions. A negative result may occur with  improper specimen collection/handling, submission of specimen other than nasopharyngeal swab, presence of viral mutation(s) within the areas targeted by this assay, and inadequate number of viral copies(<138 copies/mL). A negative result must be combined with clinical observations, patient history, and epidemiological information. The expected result is Negative.  Fact Sheet for Patients:  EntrepreneurPulse.com.au  Fact Sheet for Healthcare Providers:  IncredibleEmployment.be  This test is no t yet approved or cleared by the [place] FDA and  has been authorized for detection and/or diagnosis of SARS-CoV-2 by FDA under an Emergency Use Authorization (EUA). This EUA will remain  in effect (meaning this test can be used) for the duration of the COVID-19 declaration under Section 564(b)(1) of the Act, 21 U.S.C.section 360bbb-3(b)(1), unless the authorization is terminated  or revoked sooner.       Influenza A by PCR NEGATIVE NEGATIVE Final   Influenza B by PCR NEGATIVE NEGATIVE Final    Comment: (NOTE) The Xpert Xpress SARS-CoV-2/FLU/RSV plus assay is intended as an aid in the diagnosis of influenza from Nasopharyngeal swab specimens and should not be used as a sole basis for treatment. Nasal washings and aspirates are unacceptable for Xpert Xpress SARS-CoV-2/FLU/RSV testing.  Fact Sheet for Patients: EntrepreneurPulse.com.au  Fact Sheet for Healthcare Providers: IncredibleEmployment.be  This test is not yet approved or cleared by the [place] FDA and has been authorized for detection and/or diagnosis of  SARS-CoV-2 by FDA under an Emergency Use Authorization (EUA). This EUA will remain in effect (meaning this test can be used) for the duration of the COVID-19 declaration under Section 564(b)(1) of the Act, 21 U.S.C. section 360bbb-3(b)(1), unless the authorization is terminated or revoked.  Performed at [facility], [address] [place], [place] [address]          Radiology Studies: CT Angio Chest PE W/Cm &/Or Wo Cm  Result Date: 02/22/2021 CLINICAL DATA:  PE suspected, low/intermediate prob, positive D-dimer Shortness of breath. Radiologic records states history of lung cancer. EXAM: CT ANGIOGRAPHY CHEST WITH CONTRAST TECHNIQUE: Multidetector CT imaging of the chest was performed using the standard protocol during bolus administration of intravenous contrast. Multiplanar CT image reconstructions and MIPs were obtained to evaluate the vascular anatomy. CONTRAST:  43mL OMNIPAQUE IOHEXOL 350 MG/ML SOLN COMPARISON:  Radiograph earlier this day.  Chest CT 11/17/2020 FINDINGS: Cardiovascular: There are no filling defects within the pulmonary arteries to suggest pulmonary embolus. Atherosclerosis of the thoracic aorta. No evidence of dissection or acute aortic findings. Normal heart size with coronary artery calcifications. Mediastinum/Nodes: No enlarged mediastinal or hilar lymph nodes. Esophagus minimally patulous but no wall thickening. Thyroidectomy. Lungs/Pleura: Moderate emphysema. Post treatment scarring and architectural distortion of the paramediastinal anterior left upper lobe. This is not significantly changed in appearance from prior exam. Spiculated right middle lobe nodule measures 10 x 9 mm, series 6, image 71, not significantly changed in the interim. Bronchial thickening with occasional areas of mucous plugging in the lower lobes. No new pulmonary nodule or acute airspace disease. No pleural fluid. Upper Abdomen: No acute or unexpected findings. Musculoskeletal:  No acute osseous abnormality or focal osseous lesion. Mild thoracic spondylosis. No chest wall soft tissue abnormality. Review of the MIP images confirms the above findings. IMPRESSION: 1. No pulmonary embolus or acute intrathoracic abnormality. 2. Post treatment scarring and architectural distortion in the paramediastinal anterior left upper lobe, unchanged from prior exam. 3. Spiculated right middle lobe nodule is unchanged in size from prior exam, 10 x 9 mm. 4. Aortic atherosclerosis.  Coronary artery calcifications. 5. Emphysema. Aortic Atherosclerosis (ICD10-I70.0) and Emphysema (ICD10-J43.9). Electronically Signed   By: Keith Rake M.D.   On: 02/22/2021 22:25   US Venous Img Lower Unilateral Left  Result Date: 02/22/2021 CLINICAL DATA:  Lower extremity edema EXAM: Left LOWER EXTREMITY VENOUS DOPPLER ULTRASOUND TECHNIQUE: Gray-scale sonography with graded compression, as well as color Doppler and duplex ultrasound were performed to evaluate the lower extremity deep venous systems from the level of the common femoral vein and including the common femoral, femoral, profunda femoral, popliteal and calf veins including the posterior tibial, peroneal and gastrocnemius veins when visible. The superficial great saphenous vein was also interrogated. Spectral Doppler was utilized to evaluate flow at rest and with distal augmentation maneuvers in the common femoral, femoral and popliteal veins. COMPARISON:  None. FINDINGS: Contralateral Common Femoral Vein: Respiratory phasicity is normal and symmetric with the symptomatic side. No evidence of thrombus. Normal compressibility. Common Femoral Vein: No evidence of thrombus. Normal compressibility, respiratory phasicity and response to augmentation. Saphenofemoral Junction: No evidence of thrombus. Normal compressibility and flow on color Doppler imaging. Profunda Femoral Vein: No evidence of thrombus. Normal compressibility and flow on color Doppler imaging.  Femoral Vein: No evidence of thrombus. Normal compressibility, respiratory phasicity and response to augmentation. Popliteal Vein: No evidence of thrombus. Normal compressibility, respiratory phasicity and response to augmentation. Calf Veins: No evidence of thrombus. Normal compressibility and flow on color Doppler imaging. Superficial Great Saphenous Vein: No evidence of thrombus. Normal compressibility. Venous Reflux:  None. Other Findings:  Calf edema is noted. IMPRESSION: No evidence of deep venous thrombosis. Electronically Signed   By: Inez Catalina M.D.   On: 02/22/2021 20:44   DG Chest Port 1 View  Result Date: 02/22/2021 CLINICAL DATA:  Lethargy x3 days. EXAM: PORTABLE CHEST 1 VIEW COMPARISON:  February 10, 2021 FINDINGS: The lungs are mildly hyperinflated. Chronic appearing increased lung markings are seen within the bilateral lower lobes. There is no evidence of acute infiltrate, pleural effusion or pneumothorax. The heart size and mediastinal contours are within normal limits. The visualized skeletal structures are unremarkable. IMPRESSION: No acute or active cardiopulmonary disease. Electronically Signed   By: Virgina Norfolk M.D.   On: 02/22/2021 18:13   ECHOCARDIOGRAM COMPLETE  Result Date: 02/23/2021  ECHOCARDIOGRAM REPORT   Patient Name:   Brandee Jessee Avers Date of Exam: 02/23/2021 Medical Rec #:  [id number]     Height:       59.0 in Accession #:    [id number]    Weight:       170.0 lb Date of Birth:  18-Mar-1946     BSA:          1.721 m Patient Age:    56 years      BP:           102/59 mmHg Patient Gender: F             HR:           70 bpm. Exam Location:  Inpatient Procedure: 2D Echo, Color Doppler and Cardiac Doppler Indications:    Congestive Heart Failure I50.9  History:        Patient has no prior history of Echocardiogram examinations.                 COPD, Signs/Symptoms:Dyspnea; Risk Factors:Dyslipidemia and                 Hypertension.  Sonographer:    Bernadene Person RDCS Referring  Phys: [place]  1. Left ventricular ejection fraction, by estimation, is 60 to 65%. The left ventricle has normal function. The left ventricle has no regional wall motion abnormalities. Left ventricular diastolic parameters were normal.  2. Right ventricular systolic function is normal. The right ventricular size is normal. There is normal pulmonary artery systolic pressure.  3. The mitral valve is normal in structure. Mild mitral valve regurgitation. No evidence of mitral stenosis.  4. The aortic valve is normal in structure. Aortic valve regurgitation is not visualized. No aortic stenosis is present.  5. The inferior vena cava is normal in size with greater than 50% respiratory variability, suggesting right atrial pressure of 3 mmHg. FINDINGS  Left Ventricle: Left ventricular ejection fraction, by estimation, is 60 to 65%. The left ventricle has normal function. The left ventricle has no regional wall motion abnormalities. The left ventricular internal cavity size was normal in size. There is  no left ventricular hypertrophy. Left ventricular diastolic parameters were normal. Normal left ventricular filling pressure. Right Ventricle: The right ventricular size is normal. No increase in right ventricular wall thickness. Right ventricular systolic function is normal. There is normal pulmonary artery systolic pressure. The tricuspid regurgitant velocity is 2.84 m/s, and  with an assumed right atrial pressure of 3 mmHg, the estimated right ventricular systolic pressure is 83.1 mmHg. Left Atrium: Left atrial size was normal in size. Right Atrium: Right atrial size was normal in size. Pericardium: There is no evidence of pericardial effusion. Mitral Valve: The mitral valve is normal in structure. Mild mitral valve regurgitation. No evidence of mitral valve stenosis. Tricuspid Valve: The tricuspid valve is normal in structure. Tricuspid valve regurgitation is not demonstrated. No evidence of  tricuspid stenosis. Aortic Valve: The aortic valve is normal in structure. Aortic valve regurgitation is not visualized. No aortic stenosis is present. Pulmonic Valve: The pulmonic valve was normal in structure. Pulmonic valve regurgitation is not visualized. No evidence of pulmonic stenosis. Aorta: The aortic root is normal in size and structure. Venous: The inferior vena cava is normal in size with greater than 50% respiratory variability, suggesting right atrial pressure of 3 mmHg. IAS/Shunts: No atrial level shunt detected by color flow Doppler.  LEFT VENTRICLE PLAX 2D LVIDd:  3.50 cm  Diastology LVIDs:         2.00 cm  LV e' medial:    9.25 cm/s LV PW:         0.90 cm  LV E/e' medial:  9.2 LV IVS:        0.90 cm  LV e' lateral:   9.25 cm/s LVOT diam:     1.90 cm  LV E/e' lateral: 9.2 LV SV:         78 LV SV Index:   45 LVOT Area:     2.84 cm  RIGHT VENTRICLE RV S prime:     12.10 cm/s TAPSE (M-mode): 2.0 cm LEFT ATRIUM           Index       RIGHT ATRIUM           Index LA diam:      2.60 cm 1.51 cm/m  RA Area:     11.30 cm LA Vol (A2C): 29.3 ml 17.02 ml/m RA Volume:   25.40 ml  14.76 ml/m LA Vol (A4C): 36.7 ml 21.32 ml/m  AORTIC VALVE LVOT Vmax:   152.00 cm/s LVOT Vmean:  89.400 cm/s LVOT VTI:    0.276 m  AORTA Ao Root diam: 3.00 cm Ao Asc diam:  3.00 cm MITRAL VALVE               TRICUSPID VALVE MV Area (PHT): 3.37 cm    TR Peak grad:   32.3 mmHg MV Decel Time: 225 msec    TR Vmax:        284.00 cm/s MV E velocity: 85.10 cm/s MV A velocity: 98.50 cm/s  SHUNTS MV E/A ratio:  0.86        Systemic VTI:  0.28 m                            Systemic Diam: 1.90 cm Dani Gobble Croitoru MD Electronically signed by Sanda Klein MD Signature Date/Time: 02/23/2021/4:26:10 PM    Final         Scheduled Meds: . atorvastatin  20 mg Oral QHS  . levothyroxine  137 mcg Oral Q0600  . mometasone-formoterol  2 puff Inhalation BID   And  . umeclidinium bromide  1 puff Inhalation Daily  . pantoprazole  40 mg  Oral BID  . predniSONE  20 mg Oral Q breakfast   Continuous Infusions:   LOS: 1 day   Time spent= 35 mins    Kehaulani Fruin Arsenio Loader, MD [facility]  If 7PM-7AM, please contact night-coverage  02/24/2021, 2:54 PM

## 2021-02-24 NOTE — TOC Progression Note (Signed)
Transition of Care [facility]) - Progression Note    Patient Details  Name: Holly [name] MRN: [id number] Date of Birth: December 08, 1945  [facility] CM/SW Contact  [name], [name], RN Phone Number: 02/24/2021, 12:51 PM  Clinical Narrative: [name] HHC rep [name] following for [facility] orders d/c. Patient has travel tank for home. Family can transport home.     Expected Discharge Plan: [employer] Barriers to Discharge: Continued Medical Work up  Expected Discharge Plan and Services Expected Discharge Plan: [place]   Discharge Planning Services: CM Consult Post Acute Care Choice: [place] arrangements for the past 2 months: Single Family Home                                       Social Determinants of Health (SDOH) Interventions    Readmission Risk Interventions Readmission Risk Prevention Plan 02/18/2021  Transportation Screening Complete  PCP or Specialist Appt within 5-7 Days Complete  Home Care Screening Complete  Medication Review (RN CM) Complete  Some recent data might be hidden

## 2021-02-24 NOTE — Plan of Care (Signed)
  Problem: Health Behavior/Discharge Planning: Goal: Ability to manage health-related needs will improve Outcome: Progressing   Problem: Clinical Measurements: Goal: Diagnostic test results will improve Outcome: Progressing   Problem: Activity: Goal: Risk for activity intolerance will decrease Outcome: Progressing   Problem: Skin Integrity: Goal: Risk for impaired skin integrity will decrease Outcome: Progressing   Problem: Clinical Measurements: Goal: Cardiovascular complication will be avoided Outcome: Progressing   Problem: Nutrition: Goal: Adequate nutrition will be maintained Outcome: Progressing

## 2021-02-24 NOTE — Evaluation (Signed)
Physical Therapy Evaluation Patient Details Name: Holly [name] MRN: [id number] DOB: [birth year] Today's Date: 02/24/2021   History of Present Illness  Holly [name] is a 75 yo female presents with c/o fatigue and generalized weakness. Pt previously admitted with GI bleed, received blood. LLE negative for DVT, chest CT negative for PE. PMH: anxiety, cervicalgia, COPD, CKD, HTN, chronic hypoxic respiratory failure on 3L O2, R elbow ORIF 11/2017  Clinical Impression  Pt admitted with above diagnosis. Pt recently hospitalized, reports has someone with her 24/7 now, only providing household chore assistance. Pt ambulates 35 ft with RW, supv for safety, VCs for upright posture and maintaining body within RW frame. Pt reports nervousness with ambulation, but no LOB. Pt on 3L O2 with SpO2 96-99% during ambulation. Pt currently with functional limitations due to the deficits listed below (see PT Problem List). Pt will benefit from skilled PT to increase their independence and safety with mobility to allow discharge to the venue listed below.       Follow Up Recommendations Home health PT    Equipment Recommendations  None recommended by PT    Recommendations for Other Services       Precautions / Restrictions Precautions Precautions: Fall Restrictions Weight Bearing Restrictions: No      Mobility  Bed Mobility  General bed mobility comments: seated EOB upon arrival    Transfers Overall transfer level: Needs assistance Equipment used: Rolling walker (2 wheeled) Transfers: Sit to/from Stand Sit to Stand: Supervision  General transfer comment: VCs for hand placement to power to stand, no physical assist  Ambulation/Gait Ambulation/Gait assistance: Supervision Gait Distance (Feet): 35 Feet Assistive device: Rolling walker (2 wheeled) Gait Pattern/deviations: Step-through pattern;Decreased stride length;Trunk flexed Gait velocity: decreased   General Gait Details: pt with slow gait,  initially reports nervous but improves with distance, VCs to maintain body within RW frame and improve upright posture, on 3L with SpO2 96-99%.  Stairs            Wheelchair Mobility    Modified Rankin (Stroke Patients Only)       Balance Overall balance assessment: Needs assistance Sitting-balance support: Feet supported Sitting balance-Leahy Scale: Good Sitting balance - Comments: seated EOB   Standing balance support: During functional activity;Bilateral upper extremity supported Standing balance-Leahy Scale: Poor Standing balance comment: reliant on UE support        Pertinent Vitals/Pain Pain Assessment: Faces Faces Pain Scale: Hurts little more Pain Location: abdomen Pain Descriptors / Indicators: Aching Pain Intervention(s): Limited activity within patient's tolerance;Monitored during session    Home Living Family/patient expects to be discharged to:: Private residence Living Arrangements: Children (daughter works and friend will stay with pt) Available Help at Discharge: Family;Available 24 hours/day;Personal care attendant (daughter works till [employer], aide present 8-12) Type of Home: Mobile home Home Access: Stairs to enter;Other (comment) (Ramp to be installed) Entrance Stairs-Rails: Right;Left;Can reach both Entrance Stairs-Number of Steps: 4 Home Layout: One level Home Equipment: Shower seat;Cane - single point;Walker - 4 wheels;Grab bars - tub/shower;Wheelchair - manual (home O2 at 3L, pulse oximeter)      Prior Function Level of Independence: Needs assistance   Gait / Transfers Assistance Needed: household distance using rollator  ADL's / Homemaking Assistance Needed: pt reports ind with bathing, dressing; aide and daughter complete household chores  Comments: pt reports was supposed to start HHPT but then was hospitalized again     Hand Dominance   Dominant Hand: Right    Extremity/Trunk Assessment   Upper  Extremity Assessment Upper Extremity  Assessment: Defer to OT evaluation    Lower Extremity Assessment Lower Extremity Assessment: Overall WFL for tasks assessed;LLE deficits/detail LLE Deficits / Details: 2 fluid filled blisters on dorsum of foot, reports good feeling, tender to light touch    Cervical / Trunk Assessment Cervical / Trunk Assessment: Normal;Kyphotic  Communication   Communication: No difficulties  Cognition Arousal/Alertness: Awake/alert Behavior During Therapy: WFL for tasks assessed/performed Overall Cognitive Status: Within Functional Limits for tasks assessed     General Comments      Exercises     Assessment/Plan    PT Assessment Patient needs continued PT services  PT Problem List Decreased activity tolerance;Decreased balance;Decreased knowledge of use of DME;Cardiopulmonary status limiting activity;Pain       PT Treatment Interventions DME instruction;Gait training;Functional mobility training;Therapeutic activities;Therapeutic exercise;Balance training;Patient/family education    PT Goals (Current goals can be found in the Care Plan section)  Acute Rehab PT Goals Patient Stated Goal: return home and start home health therapy PT Goal Formulation: With patient Time For Goal Achievement: 03/10/21 Potential to Achieve Goals: Good    Frequency Min 3X/week   Barriers to discharge        Co-evaluation               AM-PAC PT "6 Clicks" Mobility  Outcome Measure Help needed turning from your back to your side while in a flat bed without using bedrails?: None Help needed moving from lying on your back to sitting on the side of a flat bed without using bedrails?: None Help needed moving to and from a bed to a chair (including a wheelchair)?: A Little Help needed standing up from a chair using your arms (e.g., wheelchair or bedside chair)?: A Little Help needed to walk in hospital room?: A Little Help needed climbing 3-5 steps with a railing? : A Little 6 Click Score: 20     End of Session Equipment Utilized During Treatment: Gait belt;Oxygen Activity Tolerance: Patient tolerated treatment well;Patient limited by fatigue Patient left: in chair;with call bell/phone within reach Nurse Communication: Mobility status PT Visit Diagnosis: Other abnormalities of gait and mobility (R26.89);Difficulty in walking, not elsewhere classified (R26.2)    Time: 1898-4210 PT Time Calculation (min) (ACUTE ONLY): 25 min   Charges:   PT Evaluation $PT Eval Low Complexity: 1 Low PT Treatments $Gait Training: 8-22 mins         Tori Lino Wickliff PT, DPT 02/24/21, 10:29 AM

## 2021-02-25 ENCOUNTER — Telehealth: Payer: Self-pay | Admitting: Radiation Oncology

## 2021-02-25 LAB — BASIC METABOLIC PANEL
Anion gap: 6 (ref 5–15)
BUN: 16 mg/dL (ref 8–23)
CO2: 41 mmol/L — ABNORMAL HIGH (ref 22–32)
Calcium: 9.5 mg/dL (ref 8.9–10.3)
Chloride: 96 mmol/L — ABNORMAL LOW (ref 98–111)
Creatinine, Ser: 0.93 mg/dL (ref 0.44–1.00)
GFR, Estimated: >60 mL/min (ref >60)
Glucose, Bld: 101 mg/dL — ABNORMAL HIGH (ref 70–99)
Potassium: 3.6 mmol/L (ref 3.5–5.1)
Sodium: 143 mmol/L (ref 135–145)

## 2021-02-25 LAB — CBC
HCT: 31.3 % — ABNORMAL LOW (ref 36.0–46.0)
Hemoglobin: 9.4 g/dL — ABNORMAL LOW (ref 12.0–15.0)
MCH: 29.6 pg (ref 26.0–34.0)
MCHC: 30 g/dL (ref 30.0–36.0)
MCV: 98.4 fL (ref 80.0–100.0)
Platelets: 145 10*3/uL — ABNORMAL LOW (ref 150–400)
RBC: 3.18 MIL/uL — ABNORMAL LOW (ref 3.87–5.11)
RDW: 17.9 % — ABNORMAL HIGH (ref 11.5–15.5)
WBC: 5.6 10*3/uL (ref 4.0–10.5)
nRBC: 0 % (ref 0.0–0.2)

## 2021-02-25 LAB — MAGNESIUM: Magnesium: 2 mg/dL (ref 1.7–2.4)

## 2021-02-25 MED ORDER — TRAMADOL HCL 50 MG PO TABS: 50.0000 mg | ORAL_TABLET | Freq: Two times a day (BID) | ORAL | 0 refills | Status: AC | PRN

## 2021-02-25 NOTE — Consult Note (Signed)
   [facility] CM Inpatient Consult   02/25/2021  Holly [name] 26-Nov-1945 [id number]   Patient chart has been reviewed for readmissions less than 30 days and for medium risk score for unplanned readmissions.  Patient assessed for community [place] Management follow up needs. Went to bedside to speak with patient; patient resting at this time. Patient will receive EMMI discharge calls post hospital admission. Provider completes [facility] calls.  Of note, [facility] Care Management services does not replace or interfere with any services that are arranged by inpatient case management or social work.  Holly Cedars, MSN, [facility] Liaison Nurse Mobile Phone [phone]  Toll free office [phone]

## 2021-02-25 NOTE — Discharge Summary (Signed)
Physician Discharge Summary  Holly [name] VZC:[id number] DOB: Oct 18, 1945 DOA: 02/22/2021  PCP: Holly Stains, MD  Admit date: 02/22/2021 Discharge date: 02/25/2021  Admitted From: Home Disposition: Home  Recommendations for Outpatient Follow-up:  1. Follow up with PCP in 1-2 weeks 2. Please obtain BMP/CBC in one week your next doctors visit.  3. Advised her to weigh herself every day and monitor her fluid intake.  She also needs to be compliant with her medications including inhalers, steroids.  Needs to refrain from smoking 4. She also needs to elevate her legs for chronic stasis/lower extremity edema 5. Follow-up outpatient pulmonary in 1 week, Dr. Alcide [name]   Discharge Condition: Stable CODE STATUS: Full code Diet recommendation: Heart healthy  Brief/Interim Summary: 75 y.o.femalewith medical history significant for multiple comorbidities including but not limited to hypertension, COPD, chronic hypoxic respiratory failure on 3 L/min oxygen, CKD stage IIIa, hypothyroidism, hyperlipidemia, thyroid neoplasm, basal cell cancer, recent GI bleed requiring blood transfusion, presented to the hospital because of generalized weakness and fatigue of about 3 days duration. She was found to have hypotension (BP was 79/60), AKI on CKD stage IIIa. She was treated with IV fluids. It is suspected that patient has underlying CHF and developed acute on chronic CHF. 2D echo showed EF 60 to 65%.  For bilateral lower extremity swelling she was advised to elevate her legs and over 24 hours her swelling significantly went down. Today she is medically stable to be discharged with outpatient follow-up as recommended above. Patient's son and daughter present at bedside during my evaluation on the day of discharge   Assessment & Plan:   Principal Problem:   Acute renal failure superimposed on stage 3a chronic kidney disease (Holly [name]) Active Problems:   COPD GOLD III    Hypotension   Hypokalemia  Acute  COPD exacerbation with chronic hypoxic respiratory failure - Patient does have mild coarse breath sounds.  No wheezing noted therefore will hold off on steroids.  Continue scheduled and as needed bronchodilators.  Incentive spirometer and flutter valve.  Continue home p.o. prednisone.  Follow-up outpatient pulmonary in 1 week.  Advised to refrain from smoking   Peripheral edema, fluid overload Chronic diastolic congestive heart failure, class III.  Preserved ejection fraction 60% -  Echocardiogram overall unremarkable with ejection fraction 60%.  Continue home daily Lasix.  Advised to weigh herself every day.  Fluid restriction. -Elevate legs.  Compression stockings.  AKI onCKD stage IIIa:Resolved Creatinine is better 0.92 from 1.15.Discharge creatinine 0.93  COPD with chronic hypoxic respiratory failure:  Continue bronchodilators and 3 L/min oxygen via nasal cannula   Spoke with her daughter over the phone Platelet dropped from 193-128.  Currently stable without any evidence of bleeding   There is no height or weight on file to calculate BMI.  Pressure Injury 02/16/21 Buttocks Right Stage 2 -  Partial thickness loss of dermis presenting as a shallow open injury with a red, pink wound bed without slough. (Active)  02/16/21 0230  Location: Buttocks  Location Orientation: Right  Staging: Stage 2 -  Partial thickness loss of dermis presenting as a shallow open injury with a red, pink wound bed without slough.  Wound Description (Comments):   Present on Admission: Yes        Discharge Diagnoses:  Principal Problem:   Acute renal failure superimposed on stage 3a chronic kidney disease (HCC) Active Problems:   COPD GOLD III    Hypotension   Hypokalemia      Consultations:  None  Subjective: Feels better no new complaints.  Daughter and son present at bedside  Discharge Exam: Vitals:   02/25/21 0504 02/25/21 0754  BP:    Pulse: 89   Resp:    Temp:     SpO2: 96% 97%   Vitals:   02/24/21 2019 02/25/21 0504 02/25/21 0504 02/25/21 0754  BP:  (!) 154/70    Pulse:  86 89   Resp:  20    Temp:  98.6 F (37 C)    TempSrc:  Oral    SpO2: 98% 93% 96% 97%    General: Pt is alert, awake, not in acute distress, 3 L nasal cannula which is her baseline Cardiovascular: RRR, S1/S2 +, no rubs, no gallops Respiratory: Very minimal bilateral rhonchi Abdominal: Soft, NT, ND, bowel sounds + Extremities: no edema, no cyanosis  Discharge Instructions   Allergies as of 02/25/2021      Reactions   Amlodipine Besylate Other (See Comments)   Other reaction(s): edema   Codeine Nausea And Vomiting   Oxycodone-acetaminophen Nausea And Vomiting      Medication List    STOP taking these medications   amoxicillin-clavulanate 875-125 MG tablet Commonly known as: AUGMENTIN     TAKE these medications   ALPRAZolam 0.5 MG tablet Commonly known as: XANAX Take 0.5 mg by mouth 2 (two) times daily as needed for anxiety.   aspirin EC 81 MG tablet Take 81 mg by mouth daily. Swallow whole.   atorvastatin 20 MG tablet Commonly known as: LIPITOR Take 20 mg by mouth at bedtime.   buPROPion 300 MG 24 hr tablet Commonly known as: WELLBUTRIN XL Take 300 mg by mouth daily.   busPIRone 30 MG tablet Commonly known as: BUSPAR Take 30 mg by mouth 2 (two) times daily.   cholecalciferol 25 MCG (1000 UNIT) tablet Commonly known as: VITAMIN D3 Take 1,000 Units by mouth daily.   donepezil 10 MG tablet Commonly known as: ARICEPT Take 10 mg by mouth at bedtime.   Fluticasone-Umeclidin-Vilant 100-62.5-25 MCG/INH Aepb Inhale 1 puff into the lungs daily. Trelegy   furosemide 20 MG tablet Commonly known as: LASIX Take 20 mg by mouth daily.   ipratropium-albuterol 0.5-2.5 (3) MG/3ML Soln Commonly known as: DUONEB Inhale 3 mLs into the lungs every 4 (four) hours as needed (respiratory issues.).   levothyroxine 137 MCG tablet Commonly known as:  SYNTHROID Take 137 mcg by mouth daily before breakfast.   metoprolol succinate 50 MG 24 hr tablet Commonly known as: TOPROL-XL Take 50 mg by mouth daily. Take with or immediately following a meal.   MUCINEX DM MAXIMUM STRENGTH PO Take 15 mLs by mouth every 4 (four) hours as needed (cough/cold symptoms).   ondansetron 8 MG tablet Commonly known as: ZOFRAN Take 8 mg by mouth every 8 (eight) hours as needed for nausea or vomiting.   pantoprazole 40 MG tablet Commonly known as: PROTONIX Take 1 tablet (40 mg total) by mouth 2 (two) times daily.   predniSONE 10 MG tablet Commonly known as: DELTASONE Take 20 mg by mouth daily with breakfast.   traMADol 50 MG tablet Commonly known as: Ultram Take 1 tablet (50 mg total) by mouth every 12 (twelve) hours as needed for up to 8 days for moderate pain or severe pain. What changed:   when to take this  reasons to take this   Ventolin HFA 108 (90 Base) MCG/ACT inhaler Generic drug: albuterol INHALE 2 PUFFS INTO THE LUNGS EVERY 4 HOURS AS NEEDED FOR WHEEZING ORSHORTNESS  OF BREATH What changed: See the new instructions.       Follow-up Information    Holly Stains, MD. Schedule an appointment as soon as possible for a visit in 1 week(s).   Specialty: Family Medicine Contact information: [address], [place] [address] [phone]        Gardiner Rhyme, MD. Schedule an appointment as soon as possible for a visit in 1 week(s).   Specialty: Specialist Contact information: [address] [place] [address] [phone]              Allergies  Allergen Reactions  . Amlodipine Besylate Other (See Comments)    Other reaction(s): edema  . Codeine Nausea And Vomiting  . Oxycodone-Acetaminophen Nausea And Vomiting    You were cared for by a hospitalist during your hospital stay. If you have any questions about your discharge medications or the care you received while you were in the  hospital after you are discharged, you can call the unit and asked to speak with the hospitalist on call if the hospitalist that took care of you is not available. Once you are discharged, your primary care physician will handle any further medical issues. Please note that no refills for any discharge medications will be authorized once you are discharged, as it is imperative that you return to your primary care physician (or establish a relationship with a primary care physician if you do not have one) for your aftercare needs so that they can reassess your need for medications and monitor your lab values.   Procedures/Studies: CT Angio Chest PE W/Cm &/Or Wo Cm  Result Date: 02/22/2021 CLINICAL DATA:  PE suspected, low/intermediate prob, positive D-dimer Shortness of breath. Radiologic records states history of lung cancer. EXAM: CT ANGIOGRAPHY CHEST WITH CONTRAST TECHNIQUE: Multidetector CT imaging of the chest was performed using the standard protocol during bolus administration of intravenous contrast. Multiplanar CT image reconstructions and MIPs were obtained to evaluate the vascular anatomy. CONTRAST:  34mL OMNIPAQUE IOHEXOL 350 MG/ML SOLN COMPARISON:  Radiograph earlier this day.  Chest CT 11/17/2020 FINDINGS: Cardiovascular: There are no filling defects within the pulmonary arteries to suggest pulmonary embolus. Atherosclerosis of the thoracic aorta. No evidence of dissection or acute aortic findings. Normal heart size with coronary artery calcifications. Mediastinum/Nodes: No enlarged mediastinal or hilar lymph nodes. Esophagus minimally patulous but no wall thickening. Thyroidectomy. Lungs/Pleura: Moderate emphysema. Post treatment scarring and architectural distortion of the paramediastinal anterior left upper lobe. This is not significantly changed in appearance from prior exam. Spiculated right middle lobe nodule measures 10 x 9 mm, series 6, image 71, not significantly changed in the interim.  Bronchial thickening with occasional areas of mucous plugging in the lower lobes. No new pulmonary nodule or acute airspace disease. No pleural fluid. Upper Abdomen: No acute or unexpected findings. Musculoskeletal: No acute osseous abnormality or focal osseous lesion. Mild thoracic spondylosis. No chest wall soft tissue abnormality. Review of the MIP images confirms the above findings. IMPRESSION: 1. No pulmonary embolus or acute intrathoracic abnormality. 2. Post treatment scarring and architectural distortion in the paramediastinal anterior left upper lobe, unchanged from prior exam. 3. Spiculated right middle lobe nodule is unchanged in size from prior exam, 10 x 9 mm. 4. Aortic atherosclerosis.  Coronary artery calcifications. 5. Emphysema. Aortic Atherosclerosis (ICD10-I70.0) and Emphysema (ICD10-J43.9). Electronically Signed   By: Keith Rake M.D.   On: 02/22/2021 22:25   US Venous Img Lower Unilateral Left  Result  Date: 02/22/2021 CLINICAL DATA:  Lower extremity edema EXAM: Left LOWER EXTREMITY VENOUS DOPPLER ULTRASOUND TECHNIQUE: Gray-scale sonography with graded compression, as well as color Doppler and duplex ultrasound were performed to evaluate the lower extremity deep venous systems from the level of the common femoral vein and including the common femoral, femoral, profunda femoral, popliteal and calf veins including the posterior tibial, peroneal and gastrocnemius veins when visible. The superficial great saphenous vein was also interrogated. Spectral Doppler was utilized to evaluate flow at rest and with distal augmentation maneuvers in the common femoral, femoral and popliteal veins. COMPARISON:  None. FINDINGS: Contralateral Common Femoral Vein: Respiratory phasicity is normal and symmetric with the symptomatic side. No evidence of thrombus. Normal compressibility. Common Femoral Vein: No evidence of thrombus. Normal compressibility, respiratory phasicity and response to augmentation.  Saphenofemoral Junction: No evidence of thrombus. Normal compressibility and flow on color Doppler imaging. Profunda Femoral Vein: No evidence of thrombus. Normal compressibility and flow on color Doppler imaging. Femoral Vein: No evidence of thrombus. Normal compressibility, respiratory phasicity and response to augmentation. Popliteal Vein: No evidence of thrombus. Normal compressibility, respiratory phasicity and response to augmentation. Calf Veins: No evidence of thrombus. Normal compressibility and flow on color Doppler imaging. Superficial Great Saphenous Vein: No evidence of thrombus. Normal compressibility. Venous Reflux:  None. Other Findings:  Calf edema is noted. IMPRESSION: No evidence of deep venous thrombosis. Electronically Signed   By: Inez Catalina M.D.   On: 02/22/2021 20:44   DG Chest Port 1 View  Result Date: 02/22/2021 CLINICAL DATA:  Lethargy x3 days. EXAM: PORTABLE CHEST 1 VIEW COMPARISON:  February 10, 2021 FINDINGS: The lungs are mildly hyperinflated. Chronic appearing increased lung markings are seen within the bilateral lower lobes. There is no evidence of acute infiltrate, pleural effusion or pneumothorax. The heart size and mediastinal contours are within normal limits. The visualized skeletal structures are unremarkable. IMPRESSION: No acute or active cardiopulmonary disease. Electronically Signed   By: Virgina Norfolk M.D.   On: 02/22/2021 18:13   ECHOCARDIOGRAM COMPLETE  Result Date: 02/23/2021    ECHOCARDIOGRAM REPORT   Patient Name:   Holly [name] Date of Exam: 02/23/2021 Medical Rec #:  [id number]     Height:       59.0 in Accession #:    [id number]    Weight:       170.0 lb Date of Birth:  Feb 23, 1946     BSA:          1.721 m Patient Age:    15 years      BP:           102/59 mmHg Patient Gender: F             HR:           70 bpm. Exam Location:  Inpatient Procedure: 2D Echo, Color Doppler and Cardiac Doppler Indications:    Congestive Heart Failure I50.9  History:         Patient has no prior history of Echocardiogram examinations.                 COPD, Signs/Symptoms:Dyspnea; Risk Factors:Dyslipidemia and                 Hypertension.  Sonographer:    Bernadene Person RDCS Referring Phys: [place]  1. Left ventricular ejection fraction, by estimation, is 60 to 65%. The left ventricle has normal function. The left ventricle has no regional wall motion abnormalities. Left ventricular  diastolic parameters were normal.  2. Right ventricular systolic function is normal. The right ventricular size is normal. There is normal pulmonary artery systolic pressure.  3. The mitral valve is normal in structure. Mild mitral valve regurgitation. No evidence of mitral stenosis.  4. The aortic valve is normal in structure. Aortic valve regurgitation is not visualized. No aortic stenosis is present.  5. The inferior vena cava is normal in size with greater than 50% respiratory variability, suggesting right atrial pressure of 3 mmHg. FINDINGS  Left Ventricle: Left ventricular ejection fraction, by estimation, is 60 to 65%. The left ventricle has normal function. The left ventricle has no regional wall motion abnormalities. The left ventricular internal cavity size was normal in size. There is  no left ventricular hypertrophy. Left ventricular diastolic parameters were normal. Normal left ventricular filling pressure. Right Ventricle: The right ventricular size is normal. No increase in right ventricular wall thickness. Right ventricular systolic function is normal. There is normal pulmonary artery systolic pressure. The tricuspid regurgitant velocity is 2.84 m/s, and  with an assumed right atrial pressure of 3 mmHg, the estimated right ventricular systolic pressure is 96.2 mmHg. Left Atrium: Left atrial size was normal in size. Right Atrium: Right atrial size was normal in size. Pericardium: There is no evidence of pericardial effusion. Mitral Valve: The mitral valve is normal in  structure. Mild mitral valve regurgitation. No evidence of mitral valve stenosis. Tricuspid Valve: The tricuspid valve is normal in structure. Tricuspid valve regurgitation is not demonstrated. No evidence of tricuspid stenosis. Aortic Valve: The aortic valve is normal in structure. Aortic valve regurgitation is not visualized. No aortic stenosis is present. Pulmonic Valve: The pulmonic valve was normal in structure. Pulmonic valve regurgitation is not visualized. No evidence of pulmonic stenosis. Aorta: The aortic root is normal in size and structure. Venous: The inferior vena cava is normal in size with greater than 50% respiratory variability, suggesting right atrial pressure of 3 mmHg. IAS/Shunts: No atrial level shunt detected by color flow Doppler.  LEFT VENTRICLE PLAX 2D LVIDd:         3.50 cm  Diastology LVIDs:         2.00 cm  LV e' medial:    9.25 cm/s LV PW:         0.90 cm  LV E/e' medial:  9.2 LV IVS:        0.90 cm  LV e' lateral:   9.25 cm/s LVOT diam:     1.90 cm  LV E/e' lateral: 9.2 LV SV:         78 LV SV Index:   45 LVOT Area:     2.84 cm  RIGHT VENTRICLE RV S prime:     12.10 cm/s TAPSE (M-mode): 2.0 cm LEFT ATRIUM           Index       RIGHT ATRIUM           Index LA diam:      2.60 cm 1.51 cm/m  RA Area:     11.30 cm LA Vol (A2C): 29.3 ml 17.02 ml/m RA Volume:   25.40 ml  14.76 ml/m LA Vol (A4C): 36.7 ml 21.32 ml/m  AORTIC VALVE LVOT Vmax:   152.00 cm/s LVOT Vmean:  89.400 cm/s LVOT VTI:    0.276 m  AORTA Ao Root diam: 3.00 cm Ao Asc diam:  3.00 cm MITRAL VALVE  TRICUSPID VALVE MV Area (PHT): 3.37 cm    TR Peak grad:   32.3 mmHg MV Decel Time: 225 msec    TR Vmax:        284.00 cm/s MV E velocity: 85.10 cm/s MV A velocity: 98.50 cm/s  SHUNTS MV E/A ratio:  0.86        Systemic VTI:  0.28 m                            Systemic Diam: 1.90 cm Dani Gobble Croitoru MD Electronically signed by Sanda Klein MD Signature Date/Time: 02/23/2021/4:26:10 PM    Final       The results of  significant diagnostics from this hospitalization (including imaging, microbiology, ancillary and laboratory) are listed below for reference.     Microbiology: Recent Results (from the past 240 hour(s))  MRSA PCR Screening     Status: None   Collection Time: 02/16/21  3:05 PM   Specimen: Nasopharyngeal  Result Value Ref Range Status   MRSA by PCR NEGATIVE NEGATIVE Final    Comment:        The GeneXpert MRSA Assay (FDA approved for NASAL specimens only), is one component of a comprehensive MRSA colonization surveillance program. It is not intended to diagnose MRSA infection nor to guide or monitor treatment for MRSA infections. Performed at [facility], [place] [address], [place], [place] [address]   Resp Panel by RT-PCR (Flu A&B, Covid) Nasopharyngeal Swab     Status: None   Collection Time: 02/22/21  5:37 PM   Specimen: Nasopharyngeal Swab; Nasopharyngeal(NP) swabs in vial transport medium  Result Value Ref Range Status   SARS Coronavirus 2 by RT PCR NEGATIVE NEGATIVE Final    Comment: (NOTE) SARS-CoV-2 target nucleic acids are NOT DETECTED.  The SARS-CoV-2 RNA is generally detectable in upper respiratory specimens during the acute phase of infection. The lowest concentration of SARS-CoV-2 viral copies this assay can detect is 138 copies/mL. A negative result does not preclude SARS-Cov-2 infection and should not be used as the sole basis for treatment or other patient management decisions. A negative result may occur with  improper specimen collection/handling, submission of specimen other than nasopharyngeal swab, presence of viral mutation(s) within the areas targeted by this assay, and inadequate number of viral copies(<138 copies/mL). A negative result must be combined with clinical observations, patient history, and epidemiological information. The expected result is Negative.  Fact Sheet for Patients:  EntrepreneurPulse.com.au  Fact Sheet for  Healthcare Providers:  IncredibleEmployment.be  This test is no t yet approved or cleared by the [place] FDA and  has been authorized for detection and/or diagnosis of SARS-CoV-2 by FDA under an Emergency Use Authorization (EUA). This EUA will remain  in effect (meaning this test can be used) for the duration of the COVID-19 declaration under Section 564(b)(1) of the Act, 21 U.S.C.section 360bbb-3(b)(1), unless the authorization is terminated  or revoked sooner.       Influenza A by PCR NEGATIVE NEGATIVE Final   Influenza B by PCR NEGATIVE NEGATIVE Final    Comment: (NOTE) The Xpert Xpress SARS-CoV-2/FLU/RSV plus assay is intended as an aid in the diagnosis of influenza from Nasopharyngeal swab specimens and should not be used as a sole basis for treatment. Nasal washings and aspirates are unacceptable for Xpert Xpress SARS-CoV-2/FLU/RSV testing.  Fact Sheet for Patients: EntrepreneurPulse.com.au  Fact Sheet for Healthcare Providers: IncredibleEmployment.be  This test is not yet approved or  cleared by the [place] and has been authorized for detection and/or diagnosis of SARS-CoV-2 by FDA under an Emergency Use Authorization (EUA). This EUA will remain in effect (meaning this test can be used) for the duration of the COVID-19 declaration under Section 564(b)(1) of the Act, 21 U.S.C. section 360bbb-3(b)(1), unless the authorization is terminated or revoked.  Performed at [facility], [address] [place], [place] [address]      Labs: BNP (last 3 results) Recent Labs    02/22/21 1736 02/23/21 0502  BNP 48.5 67.5   Basic Metabolic Panel: Recent Labs  Lab 02/22/21 1753 02/23/21 0502 02/24/21 0449 02/25/21 0435  NA 144 141 141 143  K 3.0* 3.3* 3.5 3.6  CL 93* 96* 94* 96*  CO2 44* 40* 37* 41*  GLUCOSE 103* 160* 106* 101*  BUN 16 16 17 16   CREATININE 1.42* 1.15* 0.92 0.93   CALCIUM 9.7 9.2 9.3 9.5  MG  --  1.9 1.7 2.0   Liver Function Tests: Recent Labs  Lab 02/22/21 1753  AST 16  ALT 13  ALKPHOS 73  BILITOT 0.5  PROT 5.9*  ALBUMIN 3.3*   No results for input(s): LIPASE, AMYLASE in the last 168 hours. No results for input(s): AMMONIA in the last 168 hours. CBC: Recent Labs  Lab 02/22/21 1753 02/23/21 0502 02/24/21 0449 02/25/21 0435  WBC 10.6* 6.1 7.2 5.6  NEUTROABS 6.3  --  4.5  --   HGB 11.0* 9.2* 8.9* 9.4*  HCT 36.7 30.6* 29.5* 31.3*  MCV 98.9 98.7 98.3 98.4  PLT 193 128* 136* 145*   Cardiac Enzymes: No results for input(s): CKTOTAL, CKMB, CKMBINDEX, TROPONINI in the last 168 hours. BNP: Invalid input(s): POCBNP CBG: Recent Labs  Lab 02/22/21 1800  GLUCAP 104*   D-Dimer Recent Labs    02/22/21 2057  DDIMER 0.89*   Hgb A1c No results for input(s): HGBA1C in the last 72 hours. Lipid Profile No results for input(s): CHOL, HDL, LDLCALC, TRIG, CHOLHDL, LDLDIRECT in the last 72 hours. Thyroid function studies No results for input(s): TSH, T4TOTAL, T3FREE, THYROIDAB in the last 72 hours.  Invalid input(s): FREET3 Anemia work up No results for input(s): VITAMINB12, FOLATE, FERRITIN, TIBC, IRON, RETICCTPCT in the last 72 hours. Urinalysis No results found for: COLORURINE, APPEARANCEUR, [place], [place], [place], [place], [place], [place], PROTEINUR, UROBILINOGEN, NITRITE, LEUKOCYTESUR Sepsis Labs Invalid input(s): PROCALCITONIN,  WBC,  LACTICIDVEN Microbiology Recent Results (from the past 240 hour(s))  MRSA PCR Screening     Status: None   Collection Time: 02/16/21  3:05 PM   Specimen: Nasopharyngeal  Result Value Ref Range Status   MRSA by PCR NEGATIVE NEGATIVE Final    Comment:        The GeneXpert MRSA Assay (FDA approved for NASAL specimens only), is one component of a comprehensive MRSA colonization surveillance program. It is not intended to diagnose MRSA infection nor to guide or monitor treatment  for MRSA infections. Performed at [facility], [place] [address] [place], [place] [address]   Resp Panel by RT-PCR (Flu A&B, Covid) Nasopharyngeal Swab     Status: None   Collection Time: 02/22/21  5:37 PM   Specimen: Nasopharyngeal Swab; Nasopharyngeal(NP) swabs in vial transport medium  Result Value Ref Range Status   SARS Coronavirus 2 by RT PCR NEGATIVE NEGATIVE Final    Comment: (NOTE) SARS-CoV-2 target nucleic acids are NOT DETECTED.  The SARS-CoV-2 RNA is generally detectable in upper respiratory specimens during the acute phase  of infection. The lowest concentration of SARS-CoV-2 viral copies this assay can detect is 138 copies/mL. A negative result does not preclude SARS-Cov-2 infection and should not be used as the sole basis for treatment or other patient management decisions. A negative result may occur with  improper specimen collection/handling, submission of specimen other than nasopharyngeal swab, presence of viral mutation(s) within the areas targeted by this assay, and inadequate number of viral copies(<138 copies/mL). A negative result must be combined with clinical observations, patient history, and epidemiological information. The expected result is Negative.  Fact Sheet for Patients:  EntrepreneurPulse.com.au  Fact Sheet for Healthcare Providers:  IncredibleEmployment.be  This test is no t yet approved or cleared by the [place] FDA and  has been authorized for detection and/or diagnosis of SARS-CoV-2 by FDA under an Emergency Use Authorization (EUA). This EUA will remain  in effect (meaning this test can be used) for the duration of the COVID-19 declaration under Section 564(b)(1) of the Act, 21 U.S.C.section 360bbb-3(b)(1), unless the authorization is terminated  or revoked sooner.       Influenza A by PCR NEGATIVE NEGATIVE Final   Influenza B by PCR NEGATIVE NEGATIVE Final    Comment: (NOTE) The Xpert  Xpress SARS-CoV-2/FLU/RSV plus assay is intended as an aid in the diagnosis of influenza from Nasopharyngeal swab specimens and should not be used as a sole basis for treatment. Nasal washings and aspirates are unacceptable for Xpert Xpress SARS-CoV-2/FLU/RSV testing.  Fact Sheet for Patients: EntrepreneurPulse.com.au  Fact Sheet for Healthcare Providers: IncredibleEmployment.be  This test is not yet approved or cleared by the [place] FDA and has been authorized for detection and/or diagnosis of SARS-CoV-2 by FDA under an Emergency Use Authorization (EUA). This EUA will remain in effect (meaning this test can be used) for the duration of the COVID-19 declaration under Section 564(b)(1) of the Act, 21 U.S.C. section 360bbb-3(b)(1), unless the authorization is terminated or revoked.  Performed at [facility], [address], [place], [place] [address]      Time coordinating discharge:  I have spent 35 minutes face to face with the patient and on the ward discussing the patients care, assessment, plan and disposition with other care givers. >50% of the time was devoted counseling the patient about the risks and benefits of treatment/Discharge disposition and coordinating care.   SIGNED:   Damita Lack, MD  [facility] 02/25/2021, 3:15 PM   If 7PM-7AM, please contact night-coverage

## 2021-02-25 NOTE — Telephone Encounter (Signed)
I called and spoke with the patient about her CTPA she had during a hospitalization for CHF and this showed stability in her LUL sites and in the RML. We will move her planned CT to follow these sites to 6 months but she will call sooner if she questions or concerns.

## 2021-02-25 NOTE — Progress Notes (Signed)
SATURATION QUALIFICATIONS: (This note is used to comply with regulatory documentation for home oxygen)  Patient Saturations on Room Air at Rest = 90%  Patient Saturations on Room Air while Ambulating = 87-89  Patient Saturations on 3 Liters of oxygen while Ambulating = 90-94%  Please briefly explain why patient needs home oxygen: To maintain adequate levels of oxygen.

## 2021-02-26 ENCOUNTER — Telehealth: Payer: Self-pay | Admitting: *Deleted

## 2021-02-26 NOTE — Telephone Encounter (Signed)
CALLED PATIENT TO INFORM OF STAT LABS ON 08-31-21 @ 10:15 AM @ [facility]  ON 08-31-21 - ARRIVAL TIME- 11:15 AM @ [facility], PATIENT TO HAVE WATER ONLY - 4 HRS. PRIOR TO TEST, PATIENT TO RECEIVE RESULTS ON 09-01-21 @ 8:30 AM VIA TELEPHONE FROM ALISON PERKINS, PATIENT VERIFIED UNDERSTANDING THESE APPTS.

## 2021-02-27 DIAGNOSIS — J9611 Chronic respiratory failure with hypoxia: Secondary | ICD-10-CM | POA: Diagnosis not present

## 2021-02-27 DIAGNOSIS — I129 Hypertensive chronic kidney disease with stage 1 through stage 4 chronic kidney disease, or unspecified chronic kidney disease: Secondary | ICD-10-CM | POA: Diagnosis not present

## 2021-02-27 DIAGNOSIS — E876 Hypokalemia: Secondary | ICD-10-CM | POA: Diagnosis not present

## 2021-02-27 DIAGNOSIS — J439 Emphysema, unspecified: Secondary | ICD-10-CM | POA: Diagnosis not present

## 2021-02-27 DIAGNOSIS — M47814 Spondylosis without myelopathy or radiculopathy, thoracic region: Secondary | ICD-10-CM | POA: Diagnosis not present

## 2021-02-27 DIAGNOSIS — K259 Gastric ulcer, unspecified as acute or chronic, without hemorrhage or perforation: Secondary | ICD-10-CM | POA: Diagnosis not present

## 2021-02-27 DIAGNOSIS — F0280 Dementia in other diseases classified elsewhere without behavioral disturbance: Secondary | ICD-10-CM | POA: Diagnosis not present

## 2021-02-27 DIAGNOSIS — F32A Depression, unspecified: Secondary | ICD-10-CM | POA: Diagnosis not present

## 2021-02-27 DIAGNOSIS — R350 Frequency of micturition: Secondary | ICD-10-CM | POA: Diagnosis not present

## 2021-02-27 DIAGNOSIS — K2980 Duodenitis without bleeding: Secondary | ICD-10-CM | POA: Diagnosis not present

## 2021-02-27 DIAGNOSIS — K2100 Gastro-esophageal reflux disease with esophagitis, without bleeding: Secondary | ICD-10-CM | POA: Diagnosis not present

## 2021-02-27 DIAGNOSIS — N179 Acute kidney failure, unspecified: Secondary | ICD-10-CM | POA: Diagnosis not present

## 2021-02-27 DIAGNOSIS — N1831 Chronic kidney disease, stage 3a: Secondary | ICD-10-CM | POA: Diagnosis not present

## 2021-02-27 DIAGNOSIS — L89312 Pressure ulcer of right buttock, stage 2: Secondary | ICD-10-CM | POA: Diagnosis not present

## 2021-02-27 DIAGNOSIS — I700 Atherosclerosis of aorta: Secondary | ICD-10-CM | POA: Diagnosis not present

## 2021-02-27 DIAGNOSIS — E782 Mixed hyperlipidemia: Secondary | ICD-10-CM | POA: Diagnosis not present

## 2021-02-27 DIAGNOSIS — M542 Cervicalgia: Secondary | ICD-10-CM | POA: Diagnosis not present

## 2021-02-27 DIAGNOSIS — G4730 Sleep apnea, unspecified: Secondary | ICD-10-CM | POA: Diagnosis not present

## 2021-02-27 DIAGNOSIS — K449 Diaphragmatic hernia without obstruction or gangrene: Secondary | ICD-10-CM | POA: Diagnosis not present

## 2021-02-27 DIAGNOSIS — F419 Anxiety disorder, unspecified: Secondary | ICD-10-CM | POA: Diagnosis not present

## 2021-02-27 DIAGNOSIS — E039 Hypothyroidism, unspecified: Secondary | ICD-10-CM | POA: Diagnosis not present

## 2021-02-27 DIAGNOSIS — D62 Acute posthemorrhagic anemia: Secondary | ICD-10-CM | POA: Diagnosis not present

## 2021-02-27 DIAGNOSIS — I959 Hypotension, unspecified: Secondary | ICD-10-CM | POA: Diagnosis not present

## 2021-02-27 DIAGNOSIS — K5792 Diverticulitis of intestine, part unspecified, without perforation or abscess without bleeding: Secondary | ICD-10-CM | POA: Diagnosis not present

## 2021-02-27 DIAGNOSIS — R911 Solitary pulmonary nodule: Secondary | ICD-10-CM | POA: Diagnosis not present

## 2021-03-01 DIAGNOSIS — J439 Emphysema, unspecified: Secondary | ICD-10-CM | POA: Diagnosis not present

## 2021-03-01 DIAGNOSIS — L89312 Pressure ulcer of right buttock, stage 2: Secondary | ICD-10-CM | POA: Diagnosis not present

## 2021-03-01 DIAGNOSIS — J9611 Chronic respiratory failure with hypoxia: Secondary | ICD-10-CM | POA: Diagnosis not present

## 2021-03-01 DIAGNOSIS — N179 Acute kidney failure, unspecified: Secondary | ICD-10-CM | POA: Diagnosis not present

## 2021-03-01 DIAGNOSIS — I129 Hypertensive chronic kidney disease with stage 1 through stage 4 chronic kidney disease, or unspecified chronic kidney disease: Secondary | ICD-10-CM | POA: Diagnosis not present

## 2021-03-01 DIAGNOSIS — N1831 Chronic kidney disease, stage 3a: Secondary | ICD-10-CM | POA: Diagnosis not present

## 2021-03-02 DIAGNOSIS — J9611 Chronic respiratory failure with hypoxia: Secondary | ICD-10-CM | POA: Diagnosis not present

## 2021-03-02 DIAGNOSIS — N179 Acute kidney failure, unspecified: Secondary | ICD-10-CM | POA: Diagnosis not present

## 2021-03-02 DIAGNOSIS — I129 Hypertensive chronic kidney disease with stage 1 through stage 4 chronic kidney disease, or unspecified chronic kidney disease: Secondary | ICD-10-CM | POA: Diagnosis not present

## 2021-03-02 DIAGNOSIS — N1831 Chronic kidney disease, stage 3a: Secondary | ICD-10-CM | POA: Diagnosis not present

## 2021-03-02 DIAGNOSIS — L89312 Pressure ulcer of right buttock, stage 2: Secondary | ICD-10-CM | POA: Diagnosis not present

## 2021-03-02 DIAGNOSIS — J439 Emphysema, unspecified: Secondary | ICD-10-CM | POA: Diagnosis not present

## 2021-03-03 ENCOUNTER — Ambulatory Visit (HOSPITAL_COMMUNITY): Payer: Medicare Other

## 2021-03-04 DIAGNOSIS — N183 Chronic kidney disease, stage 3 unspecified: Secondary | ICD-10-CM | POA: Diagnosis not present

## 2021-03-04 DIAGNOSIS — N1831 Chronic kidney disease, stage 3a: Secondary | ICD-10-CM | POA: Diagnosis not present

## 2021-03-04 DIAGNOSIS — I5033 Acute on chronic diastolic (congestive) heart failure: Secondary | ICD-10-CM | POA: Diagnosis not present

## 2021-03-04 DIAGNOSIS — L89312 Pressure ulcer of right buttock, stage 2: Secondary | ICD-10-CM | POA: Diagnosis not present

## 2021-03-04 DIAGNOSIS — I129 Hypertensive chronic kidney disease with stage 1 through stage 4 chronic kidney disease, or unspecified chronic kidney disease: Secondary | ICD-10-CM | POA: Diagnosis not present

## 2021-03-04 DIAGNOSIS — J449 Chronic obstructive pulmonary disease, unspecified: Secondary | ICD-10-CM | POA: Diagnosis not present

## 2021-03-04 DIAGNOSIS — K922 Gastrointestinal hemorrhage, unspecified: Secondary | ICD-10-CM | POA: Diagnosis not present

## 2021-03-04 DIAGNOSIS — I872 Venous insufficiency (chronic) (peripheral): Secondary | ICD-10-CM | POA: Diagnosis not present

## 2021-03-04 DIAGNOSIS — N179 Acute kidney failure, unspecified: Secondary | ICD-10-CM | POA: Diagnosis not present

## 2021-03-04 DIAGNOSIS — D696 Thrombocytopenia, unspecified: Secondary | ICD-10-CM | POA: Diagnosis not present

## 2021-03-04 DIAGNOSIS — J9611 Chronic respiratory failure with hypoxia: Secondary | ICD-10-CM | POA: Diagnosis not present

## 2021-03-04 DIAGNOSIS — J439 Emphysema, unspecified: Secondary | ICD-10-CM | POA: Diagnosis not present

## 2021-03-04 DIAGNOSIS — F419 Anxiety disorder, unspecified: Secondary | ICD-10-CM | POA: Diagnosis not present

## 2021-03-08 ENCOUNTER — Ambulatory Visit: Payer: Medicare Other | Admitting: Radiation Oncology

## 2021-03-08 DIAGNOSIS — I129 Hypertensive chronic kidney disease with stage 1 through stage 4 chronic kidney disease, or unspecified chronic kidney disease: Secondary | ICD-10-CM | POA: Diagnosis not present

## 2021-03-08 DIAGNOSIS — L89312 Pressure ulcer of right buttock, stage 2: Secondary | ICD-10-CM | POA: Diagnosis not present

## 2021-03-08 DIAGNOSIS — N1831 Chronic kidney disease, stage 3a: Secondary | ICD-10-CM | POA: Diagnosis not present

## 2021-03-08 DIAGNOSIS — N179 Acute kidney failure, unspecified: Secondary | ICD-10-CM | POA: Diagnosis not present

## 2021-03-08 DIAGNOSIS — J9611 Chronic respiratory failure with hypoxia: Secondary | ICD-10-CM | POA: Diagnosis not present

## 2021-03-08 DIAGNOSIS — J439 Emphysema, unspecified: Secondary | ICD-10-CM | POA: Diagnosis not present

## 2021-03-09 DIAGNOSIS — R5383 Other fatigue: Secondary | ICD-10-CM | POA: Diagnosis not present

## 2021-03-09 DIAGNOSIS — R601 Generalized edema: Secondary | ICD-10-CM | POA: Diagnosis not present

## 2021-03-09 DIAGNOSIS — J9611 Chronic respiratory failure with hypoxia: Secondary | ICD-10-CM | POA: Diagnosis not present

## 2021-03-09 DIAGNOSIS — R918 Other nonspecific abnormal finding of lung field: Secondary | ICD-10-CM | POA: Diagnosis not present

## 2021-03-09 DIAGNOSIS — F411 Generalized anxiety disorder: Secondary | ICD-10-CM | POA: Diagnosis not present

## 2021-03-09 DIAGNOSIS — G4733 Obstructive sleep apnea (adult) (pediatric): Secondary | ICD-10-CM | POA: Diagnosis not present

## 2021-03-09 DIAGNOSIS — J449 Chronic obstructive pulmonary disease, unspecified: Secondary | ICD-10-CM | POA: Diagnosis not present

## 2021-03-10 DIAGNOSIS — L89312 Pressure ulcer of right buttock, stage 2: Secondary | ICD-10-CM | POA: Diagnosis not present

## 2021-03-10 DIAGNOSIS — J439 Emphysema, unspecified: Secondary | ICD-10-CM | POA: Diagnosis not present

## 2021-03-10 DIAGNOSIS — N179 Acute kidney failure, unspecified: Secondary | ICD-10-CM | POA: Diagnosis not present

## 2021-03-10 DIAGNOSIS — I129 Hypertensive chronic kidney disease with stage 1 through stage 4 chronic kidney disease, or unspecified chronic kidney disease: Secondary | ICD-10-CM | POA: Diagnosis not present

## 2021-03-10 DIAGNOSIS — J9611 Chronic respiratory failure with hypoxia: Secondary | ICD-10-CM | POA: Diagnosis not present

## 2021-03-10 DIAGNOSIS — N1831 Chronic kidney disease, stage 3a: Secondary | ICD-10-CM | POA: Diagnosis not present

## 2021-03-12 DIAGNOSIS — I129 Hypertensive chronic kidney disease with stage 1 through stage 4 chronic kidney disease, or unspecified chronic kidney disease: Secondary | ICD-10-CM | POA: Diagnosis not present

## 2021-03-12 DIAGNOSIS — J439 Emphysema, unspecified: Secondary | ICD-10-CM | POA: Diagnosis not present

## 2021-03-12 DIAGNOSIS — J9611 Chronic respiratory failure with hypoxia: Secondary | ICD-10-CM | POA: Diagnosis not present

## 2021-03-12 DIAGNOSIS — N1831 Chronic kidney disease, stage 3a: Secondary | ICD-10-CM | POA: Diagnosis not present

## 2021-03-12 DIAGNOSIS — N179 Acute kidney failure, unspecified: Secondary | ICD-10-CM | POA: Diagnosis not present

## 2021-03-12 DIAGNOSIS — L89312 Pressure ulcer of right buttock, stage 2: Secondary | ICD-10-CM | POA: Diagnosis not present

## 2021-03-16 DIAGNOSIS — R918 Other nonspecific abnormal finding of lung field: Secondary | ICD-10-CM | POA: Diagnosis not present

## 2021-03-16 DIAGNOSIS — I129 Hypertensive chronic kidney disease with stage 1 through stage 4 chronic kidney disease, or unspecified chronic kidney disease: Secondary | ICD-10-CM | POA: Diagnosis not present

## 2021-03-16 DIAGNOSIS — G4733 Obstructive sleep apnea (adult) (pediatric): Secondary | ICD-10-CM | POA: Diagnosis not present

## 2021-03-16 DIAGNOSIS — J439 Emphysema, unspecified: Secondary | ICD-10-CM | POA: Diagnosis not present

## 2021-03-16 DIAGNOSIS — L89312 Pressure ulcer of right buttock, stage 2: Secondary | ICD-10-CM | POA: Diagnosis not present

## 2021-03-16 DIAGNOSIS — F411 Generalized anxiety disorder: Secondary | ICD-10-CM | POA: Diagnosis not present

## 2021-03-16 DIAGNOSIS — J449 Chronic obstructive pulmonary disease, unspecified: Secondary | ICD-10-CM | POA: Diagnosis not present

## 2021-03-16 DIAGNOSIS — R601 Generalized edema: Secondary | ICD-10-CM | POA: Diagnosis not present

## 2021-03-16 DIAGNOSIS — N1831 Chronic kidney disease, stage 3a: Secondary | ICD-10-CM | POA: Diagnosis not present

## 2021-03-16 DIAGNOSIS — R5383 Other fatigue: Secondary | ICD-10-CM | POA: Diagnosis not present

## 2021-03-16 DIAGNOSIS — N179 Acute kidney failure, unspecified: Secondary | ICD-10-CM | POA: Diagnosis not present

## 2021-03-16 DIAGNOSIS — K259 Gastric ulcer, unspecified as acute or chronic, without hemorrhage or perforation: Secondary | ICD-10-CM | POA: Diagnosis not present

## 2021-03-16 DIAGNOSIS — D638 Anemia in other chronic diseases classified elsewhere: Secondary | ICD-10-CM | POA: Diagnosis not present

## 2021-03-16 DIAGNOSIS — J9611 Chronic respiratory failure with hypoxia: Secondary | ICD-10-CM | POA: Diagnosis not present

## 2021-03-18 DIAGNOSIS — I129 Hypertensive chronic kidney disease with stage 1 through stage 4 chronic kidney disease, or unspecified chronic kidney disease: Secondary | ICD-10-CM | POA: Diagnosis not present

## 2021-03-18 DIAGNOSIS — J439 Emphysema, unspecified: Secondary | ICD-10-CM | POA: Diagnosis not present

## 2021-03-18 DIAGNOSIS — J9611 Chronic respiratory failure with hypoxia: Secondary | ICD-10-CM | POA: Diagnosis not present

## 2021-03-18 DIAGNOSIS — N1831 Chronic kidney disease, stage 3a: Secondary | ICD-10-CM | POA: Diagnosis not present

## 2021-03-18 DIAGNOSIS — L89312 Pressure ulcer of right buttock, stage 2: Secondary | ICD-10-CM | POA: Diagnosis not present

## 2021-03-18 DIAGNOSIS — N179 Acute kidney failure, unspecified: Secondary | ICD-10-CM | POA: Diagnosis not present

## 2021-03-22 ENCOUNTER — Emergency Department (HOSPITAL_COMMUNITY): Payer: Medicare Other

## 2021-03-22 ENCOUNTER — Encounter (HOSPITAL_COMMUNITY): Payer: Self-pay | Admitting: Emergency Medicine

## 2021-03-22 ENCOUNTER — Other Ambulatory Visit: Payer: Self-pay

## 2021-03-22 ENCOUNTER — Emergency Department (HOSPITAL_COMMUNITY)
Admission: EM | Admit: 2021-03-22 | Discharge: 2021-03-23 | Disposition: A | Discharge status: Home / Self Care | Payer: Medicare Other | Attending: Emergency Medicine | Admitting: Emergency Medicine

## 2021-03-22 DIAGNOSIS — E039 Hypothyroidism, unspecified: Secondary | ICD-10-CM | POA: Diagnosis not present

## 2021-03-22 DIAGNOSIS — J45909 Unspecified asthma, uncomplicated: Secondary | ICD-10-CM | POA: Diagnosis not present

## 2021-03-22 DIAGNOSIS — R0602 Shortness of breath: Secondary | ICD-10-CM | POA: Insufficient documentation

## 2021-03-22 DIAGNOSIS — D649 Anemia, unspecified: Secondary | ICD-10-CM | POA: Diagnosis not present

## 2021-03-22 DIAGNOSIS — N1831 Chronic kidney disease, stage 3a: Secondary | ICD-10-CM | POA: Diagnosis not present

## 2021-03-22 DIAGNOSIS — R0600 Dyspnea, unspecified: Secondary | ICD-10-CM

## 2021-03-22 DIAGNOSIS — Z7982 Long term (current) use of aspirin: Secondary | ICD-10-CM | POA: Insufficient documentation

## 2021-03-22 DIAGNOSIS — J9611 Chronic respiratory failure with hypoxia: Secondary | ICD-10-CM | POA: Diagnosis not present

## 2021-03-22 DIAGNOSIS — Z85828 Personal history of other malignant neoplasm of skin: Secondary | ICD-10-CM | POA: Diagnosis not present

## 2021-03-22 DIAGNOSIS — Z79899 Other long term (current) drug therapy: Secondary | ICD-10-CM | POA: Diagnosis not present

## 2021-03-22 DIAGNOSIS — J439 Emphysema, unspecified: Secondary | ICD-10-CM | POA: Diagnosis not present

## 2021-03-22 DIAGNOSIS — J441 Chronic obstructive pulmonary disease with (acute) exacerbation: Secondary | ICD-10-CM | POA: Diagnosis not present

## 2021-03-22 DIAGNOSIS — R079 Chest pain, unspecified: Secondary | ICD-10-CM | POA: Insufficient documentation

## 2021-03-22 DIAGNOSIS — Z20822 Contact with and (suspected) exposure to covid-19: Secondary | ICD-10-CM | POA: Diagnosis not present

## 2021-03-22 DIAGNOSIS — L89312 Pressure ulcer of right buttock, stage 2: Secondary | ICD-10-CM | POA: Diagnosis not present

## 2021-03-22 DIAGNOSIS — I129 Hypertensive chronic kidney disease with stage 1 through stage 4 chronic kidney disease, or unspecified chronic kidney disease: Secondary | ICD-10-CM | POA: Diagnosis not present

## 2021-03-22 DIAGNOSIS — Z7951 Long term (current) use of inhaled steroids: Secondary | ICD-10-CM | POA: Insufficient documentation

## 2021-03-22 DIAGNOSIS — Z85118 Personal history of other malignant neoplasm of bronchus and lung: Secondary | ICD-10-CM | POA: Diagnosis not present

## 2021-03-22 DIAGNOSIS — Z87891 Personal history of nicotine dependence: Secondary | ICD-10-CM | POA: Insufficient documentation

## 2021-03-22 DIAGNOSIS — N179 Acute kidney failure, unspecified: Secondary | ICD-10-CM | POA: Diagnosis not present

## 2021-03-22 DIAGNOSIS — Z85850 Personal history of malignant neoplasm of thyroid: Secondary | ICD-10-CM | POA: Diagnosis not present

## 2021-03-22 LAB — CBC WITH DIFFERENTIAL/PLATELET
Abs Immature Granulocytes: 0.06 10*3/uL (ref 0.00–0.07)
Basophils Absolute: 0 10*3/uL (ref 0.0–0.1)
Basophils Relative: 0 %
Eosinophils Absolute: 0.2 10*3/uL (ref 0.0–0.5)
Eosinophils Relative: 3 %
HCT: 30.4 % — ABNORMAL LOW (ref 36.0–46.0)
Hemoglobin: 9.2 g/dL — ABNORMAL LOW (ref 12.0–15.0)
Immature Granulocytes: 1 %
Lymphocytes Relative: 27 %
Lymphs Abs: 2.3 10*3/uL (ref 0.7–4.0)
MCH: 28.7 pg (ref 26.0–34.0)
MCHC: 30.3 g/dL (ref 30.0–36.0)
MCV: 94.7 fL (ref 80.0–100.0)
Monocytes Absolute: 0.8 10*3/uL (ref 0.1–1.0)
Monocytes Relative: 10 %
Neutro Abs: 5.2 10*3/uL (ref 1.7–7.7)
Neutrophils Relative %: 59 %
Platelets: 262 10*3/uL (ref 150–400)
RBC: 3.21 MIL/uL — ABNORMAL LOW (ref 3.87–5.11)
RDW: 17.2 % — ABNORMAL HIGH (ref 11.5–15.5)
WBC: 8.6 10*3/uL (ref 4.0–10.5)
nRBC: 0 % (ref 0.0–0.2)

## 2021-03-22 LAB — BLOOD GAS, ARTERIAL
Acid-Base Excess: 15 mmol/L — ABNORMAL HIGH (ref 0.0–2.0)
Bicarbonate: 41.4 mmol/L — ABNORMAL HIGH (ref 20.0–28.0)
O2 Saturation: 99.8 %
Patient temperature: 98.6
pCO2 arterial: 64.8 mmHg — ABNORMAL HIGH (ref 32.0–48.0)
pH, Arterial: 7.421 (ref 7.350–7.450)
pO2, Arterial: 148 mmHg — ABNORMAL HIGH (ref 83.0–108.0)

## 2021-03-22 IMAGING — CR DG CHEST 2V
2 series · 2 of 2 positions shown · non-contrast
Comparison: [DATE]

CLINICAL DATA: Shortness of breath x3 days.

EXAM:
CHEST - 2 VIEW

[w chest lat]
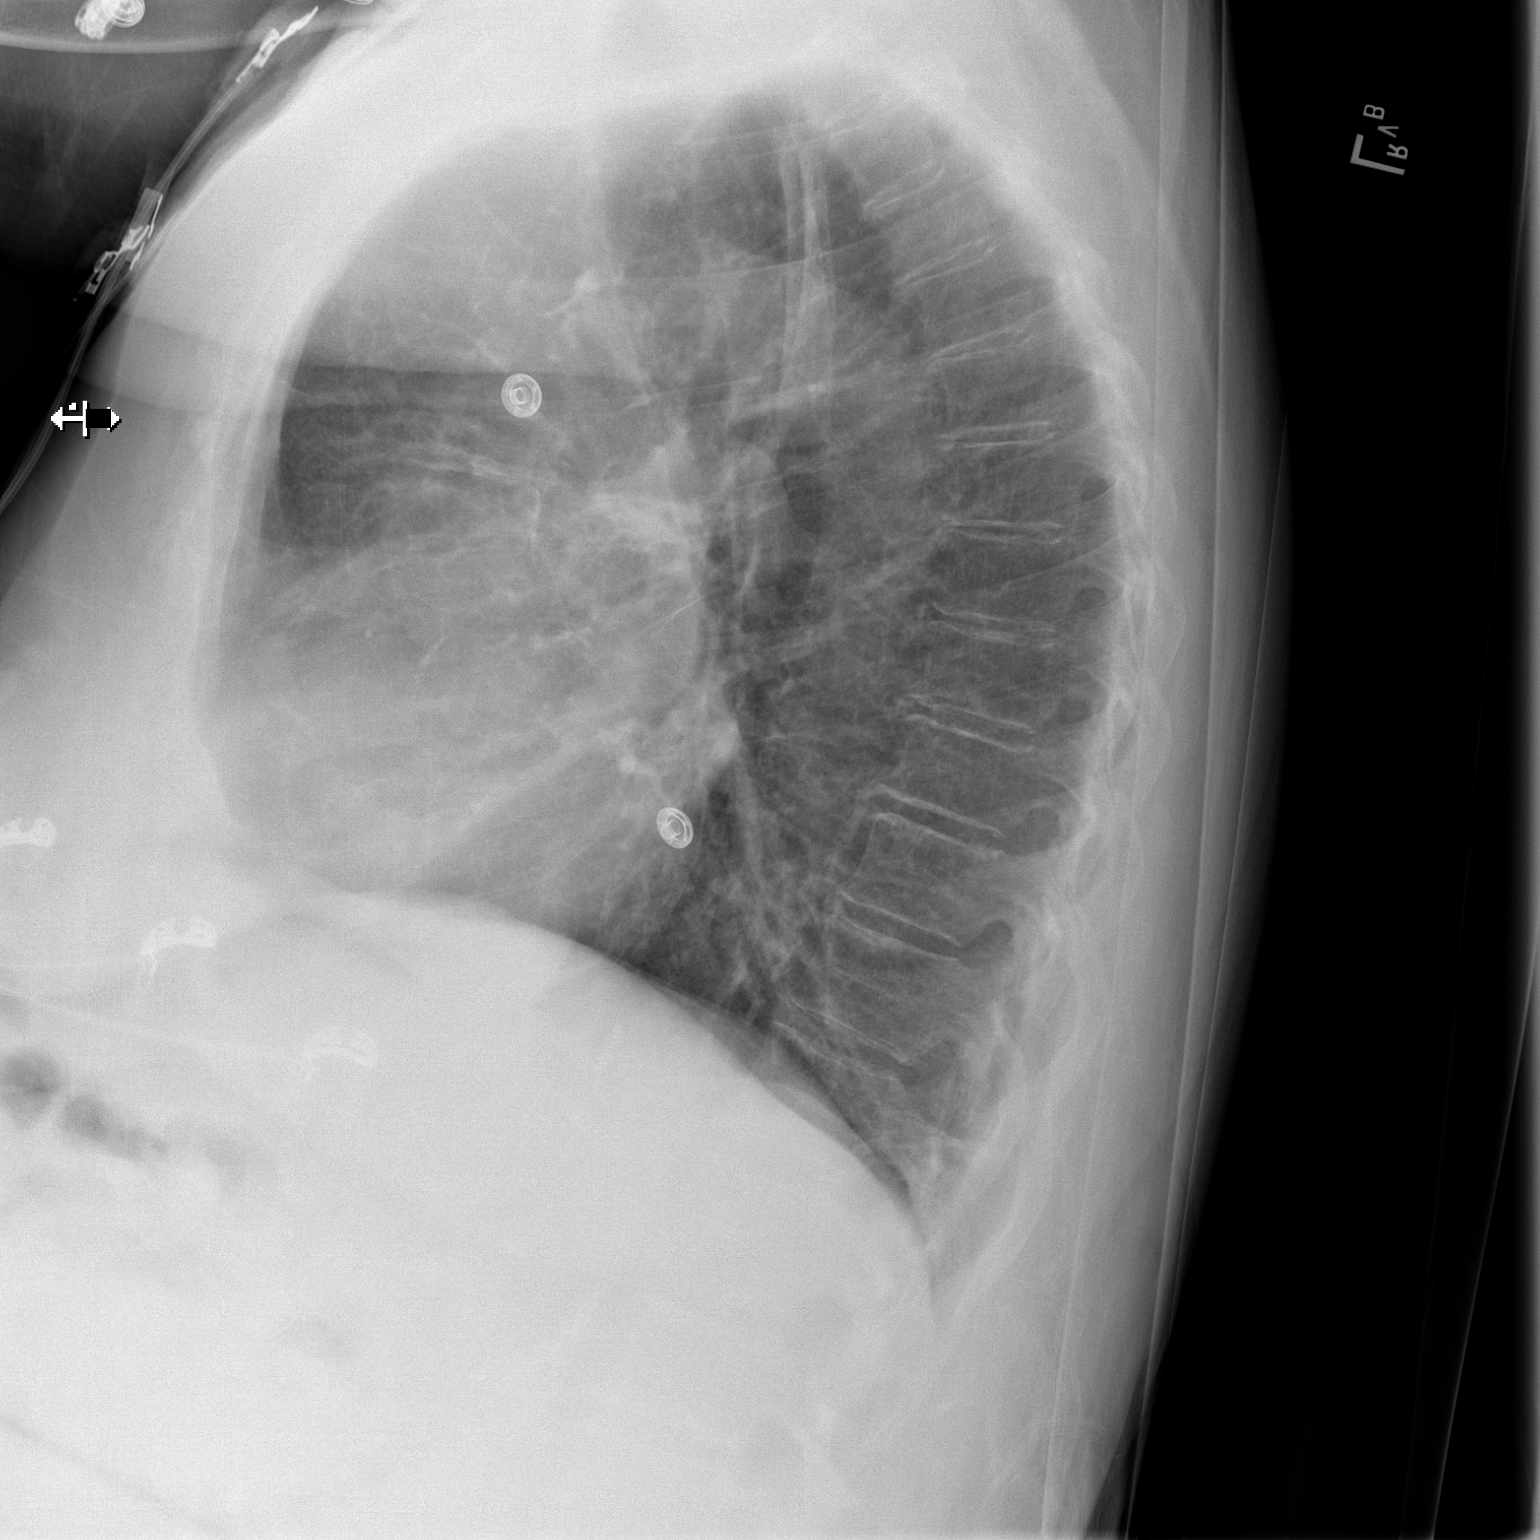

[x chest ap]
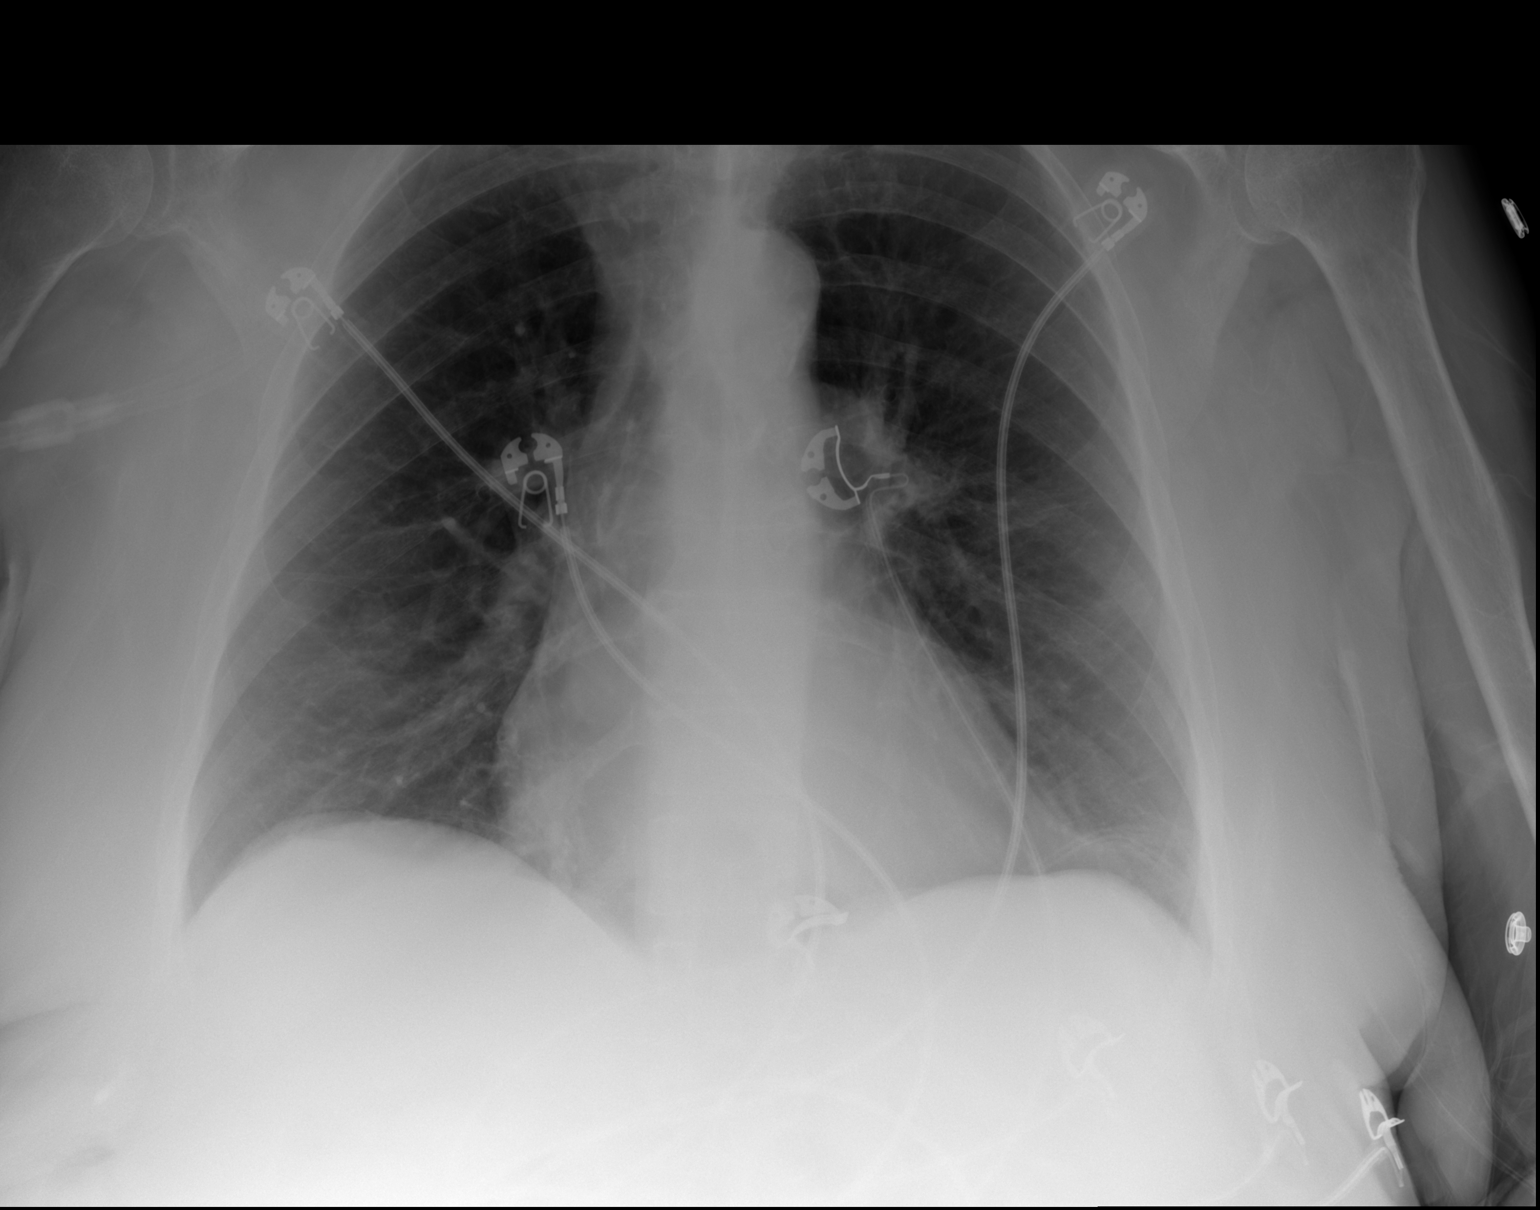

[2 of 2 positions shown; findings below may reference images not displayed]

FINDINGS: The heart size and mediastinal contours are within normal limits.
Both lungs are clear. The visualized skeletal structures are
unremarkable.
IMPRESSION: No active cardiopulmonary disease.

## 2021-03-22 NOTE — ED Notes (Signed)
Attempted lab work 1.  Unsuccessful.

## 2021-03-22 NOTE — ED Provider Notes (Signed)
Emergency Medicine Provider Triage Evaluation Note  Marcelyn Ditty , a 75 y.o. female  was evaluated in triage.  Pt complains of shortness of breath and generalized weakness.  States that breath has been going on for several weeks, worsening each day.  Patient wears oxygen at home, has needed increased recently.  She reports chest tightness which feels different from her normal.  No new fever or cough.  No nausea or vomiting.  She has a history of heart failure, is on diuretics does not take it regularly.  History of hypercarbia.  Review of Systems  Positive: Sob, weakness Negative: fever  Physical Exam  BP (!) 119/57 (BP Location: Left Arm)   Pulse 81   Temp 99.3 F (37.4 C) (Oral)   Resp (!) 23   Ht 5' (1.524 m)   Wt 72.6 kg   SpO2 100%   BMI 31.25 kg/m  Gen:   Awake, no distress   Resp:  Diminished lung sounds throughout.  Sats stable on 3 L. MSK:   Moves extremities without difficulty.  3+ pitting edema bilaterally.   Medical Decision Making  Medically screening exam initiated at 10:20 PM.  Appropriate orders placed.  Lonzo Candy Pennywell was informed that the remainder of the evaluation will be completed by another provider, this initial triage assessment does not replace that evaluation, and the importance of remaining in the ED until their evaluation is complete.  Labs, chest x-ray, urine, EKG ordered.   Franchot Heidelberg, PA-C 03/22/21 2221    Dorie Rank, MD 03/23/21 1058

## 2021-03-22 NOTE — ED Provider Notes (Addendum)
[facility] DEPT Provider Note   CSN: [id number] Arrival date & time: 03/22/21  2126    History Chief Complaint  Patient presents with  . Shortness of Breath    Holly [name] is a 75 y.o. female with a hx of chronic respiratory failure on 3L via [place] at baseline, COPD, hypertension, hypothyroidism, hyperlipidemia, sleep apnea (uses cpap intermittently), CKD, and prior GI bleed who presents to the ED with her daughter for evaluation of dyspnea that has progressively worsened over the past several weeks. She states dyspnea is fairly constant, much worse with exertion and supine position, alleviated some with rest. Associated chest tightness when she is feeling short of breath especially after activity. She has not been consistently taking her diuretic per her daughter. Patient reports minimal dry cough and baseline LE swelling as well- however her daughter feels the swelling is worse. She denies fever, dizziness, lightheadedness, syncope, nausea, vomiting, or hemoptysis.   HPI     Past Medical History:  Diagnosis Date  . Anxiety   . Asthma   . Basal cell carcinoma    lung cancer.  Skin cancer- basal - nose removed  . Bruises easily   . Cataract   . Cervicalgia   . 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 Dyspnea    uses O2 only when needed. Uses 2 L   . Foot swelling    left- has shown to Drs.  . Frequency of urination   . Headache   . 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 Hypercalcemia   . Hypertension   . Hypothyroidism   . Mixed hyperlipidemia   . Pneumonia   . Pulmonary nodule   . Sleep apnea    does not wear cpap  . Thyroid neoplasm   . Urinary incontinence   . Wears dentures   . Wears glasses     Patient Active Problem List   Diagnosis Date Noted  . Hypotension 02/23/2021  . Hypokalemia 02/23/2021  . Acute renal failure superimposed on stage 3a chronic kidney disease ([place]) 02/22/2021  .  GI bleed 02/16/2021  . ABLA (acute blood loss anemia) 02/16/2021  . Diverticulitis 02/16/2021  . Current chronic use of systemic steroids 02/16/2021  . Pressure injury of skin 02/16/2021  . Olecranon bursitis of right elbow 10/02/2020  . Ulna, olecranon process fracture, right, closed, with routine healing, subsequent encounter 10/01/2020  . Primary malignant neoplasm of left upper lobe of lung ([place]) 07/09/2019  . Closed fracture of right olecranon process 12/04/2017  . Cough 11/11/2015  . Chronic respiratory failure with hypoxia ([place]) 11/11/2014  . Cigarette smoker 10/16/2014  . Multinodular thyroid 07/30/2014  . 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 COPD exacerbation ([place]) 02/11/2013  . Pedal edema 02/11/2013  . COPD GOLD III  06/11/2010  . CARCINOMA, BASAL CELL, 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  . HYPERLIPIDEMIA, MIXED 05/13/2010  . HYPERCALCEMIA 05/13/2010  . ANXIETY 05/13/2010  . Essential hypertension 05/13/2010  . CERVICALGIA 05/13/2010  . OSTEOPENIA 05/13/2010  . URINARY INCONTINENCE 05/13/2010  . CARCINOMA, BASAL CELL, 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    Past Surgical History:  Procedure Laterality Date  . BIOPSY  02/17/2021   Procedure: BIOPSY;  Surgeon: Otis Brace, MD;  Location: [facility];  Service: Gastroenterology;;  . COLONOSCOPY    . DENTAL SURGERY    . ELBOW ARTHROPLASTY Right 2020  . ESOPHAGOGASTRODUODENOSCOPY (EGD) WITH PROPOFOL N/A 02/17/2021  Procedure: ESOPHAGOGASTRODUODENOSCOPY (EGD) WITH PROPOFOL;  Surgeon: Otis Brace, MD;  Location: [facility];  Service: Gastroenterology;  Laterality: N/A;  . EYE SURGERY Right    cataract surgery  . FOOT SURGERY  30 YRS AGO   BILATERAL  . HARDWARE REMOVAL Right 10/02/2020   Procedure: HARDWARE REMOVAL RIGHT ELBOW;  Surgeon: Shona Needles, MD;  Location: [facility];  Service: Orthopedics;  Laterality: Right;  . I & D EXTREMITY Right 01/13/2021   Procedure: IRRIGATION AND DEBRIDEMENT RIGHT ELBOW;   Surgeon: Shona Needles, MD;  Location: [facility];  Service: Orthopedics;  Laterality: Right;  . ORIF ELBOW FRACTURE Right 12/06/2017   Procedure: OPEN REDUCTION INTERNAL FIXATION (ORIF) ELBOW/OLECRANON FRACTURE;  Surgeon: Shona Needles, MD;  Location: [facility];  Service: Orthopedics;  Laterality: Right;  . SKIN CANCER EXCISION    . THYROIDECTOMY N/A 07/31/2014   Procedure: TOTAL THYROIDECTOMY;  Surgeon: Armandina Gemma, MD;  Location: [facility];  Service: General;  Laterality: N/A;  . VAGINAL HYSTERECTOMY    . VIDEO BRONCHOSCOPY WITH ENDOBRONCHIAL NAVIGATION Bilateral 06/25/2019   Procedure: VIDEO BRONCHOSCOPY WITH ENDOBRONCHIAL NAVIGATION;  Surgeon: Lajuana Matte, MD;  Location: [facility];  Service: Thoracic;  Laterality: Bilateral;     OB History   No obstetric history on file.     Family History  Problem Relation Age of Onset  . Coronary artery disease Father   . Hypertension Father   . Asthma Father   . Allergies Father   . Hypertension Mother   . Lung cancer Mother     Social History   Tobacco Use  . Smoking status: Former Smoker    Packs/day: 1.00    Years: 30.00    Pack years: 30.00    Types: Cigarettes    Quit date: 02/24/2015    Years since quitting: 6.0  . Smokeless tobacco: Never Used  Vaping Use  . Vaping Use: Former  Substance Use Topics  . Alcohol use: Yes    Alcohol/week: 0.0 standard drinks    Comment: very seldom  . Drug use: Never    Home Medications Prior to Admission medications   Medication Sig Start Date End Date Taking? Authorizing Provider  ALPRAZolam Duanne Moron) 0.5 MG tablet Take 0.5 mg by mouth 2 (two) times daily as needed for anxiety. 09/14/20   [provider]  aspirin EC 81 MG tablet Take 81 mg by mouth daily. Swallow whole.    [provider]  atorvastatin (LIPITOR) 20 MG tablet Take 20 mg by mouth at bedtime.     [provider]  buPROPion (WELLBUTRIN XL) 300 MG 24 hr tablet Take 300 mg by mouth daily.    [provider]  busPIRone (BUSPAR) 30 MG tablet Take 30 mg by mouth 2 (two) times daily.    [provider]  cholecalciferol (VITAMIN D3) 25 MCG (1000 UNIT) tablet Take 1,000 Units by mouth daily.    [provider]  Dextromethorphan-guaiFENesin (MUCINEX DM MAXIMUM STRENGTH PO) Take 15 mLs by mouth every 4 (four) hours as needed (cough/cold symptoms).    [provider]  donepezil (ARICEPT) 10 MG tablet Take 10 mg by mouth at bedtime. 12/17/20   [provider]  Fluticasone-Umeclidin-Vilant 100-62.5-25 MCG/INH AEPB Inhale 1 puff into the lungs daily. Trelegy    [provider]  furosemide (LASIX) 20 MG tablet Take 20 mg by mouth daily. 12/18/20   [provider]  ipratropium-albuterol (DUONEB) 0.5-2.5 (3) MG/3ML SOLN Inhale 3 mLs into the lungs every  4 (four) hours as needed (respiratory issues.). 01/29/20   [provider]  levothyroxine (SYNTHROID) 137 MCG tablet Take 137 mcg by mouth daily before breakfast. 09/22/20   [provider]  metoprolol succinate (TOPROL-XL) 50 MG 24 hr tablet Take 50 mg by mouth daily. Take with or immediately following a meal.    [provider]  ondansetron (ZOFRAN) 8 MG tablet Take 8 mg by mouth every 8 (eight) hours as needed for nausea or vomiting. 02/09/21   [provider]  pantoprazole (PROTONIX) 40 MG tablet Take 1 tablet (40 mg total) by mouth 2 (two) times daily. 02/18/21 04/20/21  Dessa Phi, DO  predniSONE (DELTASONE) 10 MG tablet Take 20 mg by mouth daily with breakfast.    [provider]  VENTOLIN HFA 108 (90 Base) MCG/ACT inhaler INHALE 2 PUFFS INTO THE LUNGS EVERY 4 HOURS AS NEEDED FOR WHEEZING ORSHORTNESS OF BREATH Patient taking differently: Inhale 2 puffs into the lungs every 4 (four) hours as needed for wheezing or shortness of breath. 06/29/17   Parrett, Fonnie Mu, NP    Allergies    Amlodipine besylate, Codeine, and Oxycodone-acetaminophen  Review of  Systems   Review of Systems  Constitutional: Negative for chills and fever.  Respiratory: Positive for cough (minimal), chest tightness and shortness of breath.   Cardiovascular: Positive for chest pain and leg swelling.  Gastrointestinal: Negative for blood in stool, nausea and vomiting.  Genitourinary: Negative for dysuria.  Musculoskeletal: Positive for arthralgias (right elbow- problems with bursitis- followed by Dr. Doreatha Martin).  Neurological: Negative for dizziness, syncope and light-headedness.  All other systems reviewed and are negative.   Physical Exam Updated Vital Signs BP (!) 119/57 (BP Location: Left Arm)   Pulse 81   Temp 99.3 F (37.4 C) (Oral)   Resp (!) 23   Ht 5' (1.524 m)   Wt 72.6 kg   SpO2 100%   BMI 31.25 kg/m   Physical Exam Vitals and nursing note reviewed.  Constitutional:      General: She is not in acute distress. HENT:     Head: Normocephalic and atraumatic.  Eyes:     Pupils: Pupils are equal, round, and reactive to light.  Cardiovascular:     Rate and Rhythm: Normal rate and regular rhythm.  Pulmonary:     Effort: Tachypnea (mild) present.     Breath sounds: Decreased breath sounds (bibasilar) and rales (bibasilar) present. No wheezing.     Comments: Saturating 98% on baseline 3L via  Musculoskeletal:     Cervical back: Neck supple.     Comments: RUE: mild erythema/swelling to the right elbow olecranon bursa- no purulent drainage, not hot to touch, able to full flex/extend.  3-4+ pitting edema to the bilateral lower legs. No calf tenderness.   Skin:    General: Skin is warm and dry.  Neurological:     Mental Status: She is alert.     Comments: Clear speech.   Psychiatric:        Mood and Affect: Mood normal.        Behavior: Behavior normal.    ED Results / Procedures / Treatments   Labs (all labs ordered are listed, but only abnormal results are displayed) Labs Reviewed  BLOOD GAS, ARTERIAL - Abnormal; Notable for the following  components:      Result Value   pCO2 arterial 64.8 (*)    pO2, Arterial 148 (*)    Bicarbonate 41.4 (*)    Acid-Base Excess 15.0 (*)  All other components within normal limits  RESP PANEL BY RT-PCR (FLU A&B, COVID) ARPGX2  CBC WITH DIFFERENTIAL/PLATELET  COMPREHENSIVE METABOLIC PANEL  BRAIN NATRIURETIC PEPTIDE  URINALYSIS, ROUTINE W REFLEX MICROSCOPIC  TROPONIN I (HIGH SENSITIVITY)    EKG EKG Interpretation  Date/Time:  Monday March 22 2021 22:08:28 EDT Ventricular Rate:  75 PR Interval:    QRS Duration: 82 QT Interval:  388 QTC Calculation: 437 R Axis:   47 Text Interpretation: sinus rhythm Abnormal T, consider ischemia, lateral leads 12 Lead; Mason-Likar Poor data quality Confirmed by Dorie Rank [phone]) on 03/22/2021 10:28:37 PM   Radiology DG Chest 2 View  Result Date: 03/22/2021 CLINICAL DATA:  Shortness of breath x3 days. EXAM: CHEST - 2 VIEW COMPARISON:  Feb 22, 2021 FINDINGS: The heart size and mediastinal contours are within normal limits. Both lungs are clear. The visualized skeletal structures are unremarkable. IMPRESSION: No active cardiopulmonary disease. Electronically Signed   By: Virgina Norfolk M.D.   On: 03/22/2021 23:20    Procedures Procedures   Medications Ordered in ED Medications - No data to display  ED Course  I have reviewed the triage vital signs and the nursing notes.  Pertinent labs & imaging results that were available during my care of the patient were reviewed by me and considered in my medical decision making (see chart for details).    MDM Rules/Calculators/A&P                         Patient presents to the ED with complaints of progressively worsening dyspnea over the past several weeks.  Patient is nontoxic, she is mildly tachypneic, but saturating well on her baseline 3 L via nasal cannula.  Vitals are otherwise unremarkable.  Breath sounds are a bit decreased at the bases with faint rales.  Patient has significant 3-4+ pitting edema  noted to the bilateral lower extremities.  Additional history obtained:  Additional history obtained from chart review & nursing note review.  EKG: No STEMI Lab Tests:  I Ordered, reviewed, and interpreted labs, which included:  ABG with normal pH, mild elevation in CO2 CBC: Baseline anemia CMP: Renal function similar to prior ranges.  Bicarb at baseline. BNP: Within normal limits Troponins: Flat. COVID/influenza testing: Negative  Imaging Studies ordered:  I ordered imaging studies which included CXR, I independently reviewed, formal radiology impression shows: No active cardiopulmonary disease.  ED Course:   Troponins are flat, EKG without findings of STEMI, patient states her symptoms are more respiratory, feel that ACS is less likely at this time.  She is low risk [place], have a low suspicion for pulmonary embolism.  She is afebrile without leukocytosis and has no infiltrate on chest x-ray to suggest pneumonia.Patient does have history of COPD, will trial a DuoNeb treatment to help with her symptoms, however she has no wheezing on exam and has not had increased sputum production to raise concern for COPD exacerbation.  Her chest x-ray does not show findings of pulmonary edema, however she does have significant peripheral edema and has not been taking her Lasix as prescribed, suspect that patient's symptoms may be related to CHF, she does not have a documented history of this in her medical problem list but her daughter states that she has been told that she has heart failure in the past.  Given overall reassuring work-up we will plan to give 20 mg of IV Lasix, diuresis, reevaluate, and ambulate on her baseline oxygen to determine disposition.  Following IV Lasix patient is diuresing well, she has put out greater than 800 cc of urine thus far.  Following diuresis patient was able to ambulate throughout the ED on her baseline 3L via [place] maintaining SpO2 99-100% & HR in the 80s. Remains without  wheezing. Overall fairly well appearing. Through shared decision making plan for discharge home with increased lasix to 40 mg daily for the next 3 days and very close PCP follow up and or cardiology follow up with extremely strict return precautions. I discussed results, treatment plan, need for follow-up, and return precautions with the patient and her daughter via telephone. Provided opportunity for questions, patient & her daughter confirmed understanding and are in agreement with plan.   This is a shared visit with supervising physician Dr. Leonidas Romberg who has independently evaluated patient & provided guidance, in agreement with care   Portions of this note were generated with Dragon dictation software. Dictation errors may occur despite best attempts at proofreading.  Final Clinical Impression(s) / ED Diagnoses Final diagnoses:  Dyspnea, unspecified type    Rx / DC Orders ED Discharge Orders         Ordered    furosemide (LASIX) 20 MG tablet  Daily        03/23/21 0707           Amaryllis Dyke, PA-C 03/23/21 0710    Amaryllis Dyke, PA-C 03/23/21 0720    Isla Pence, MD 03/24/21 [phone]

## 2021-03-22 NOTE — ED Triage Notes (Signed)
Patient complaining of sob, body hurts, and has not been eating since Saturday. Patient is Nasal [place] 3 liters O2 Sat - 100%. Patient has a hx of carbon monoxide build up.

## 2021-03-23 DIAGNOSIS — R0602 Shortness of breath: Secondary | ICD-10-CM | POA: Diagnosis not present

## 2021-03-23 DIAGNOSIS — S52021D Displaced fracture of olecranon process without intraarticular extension of right ulna, subsequent encounter for closed fracture with routine healing: Secondary | ICD-10-CM | POA: Diagnosis not present

## 2021-03-23 LAB — COMPREHENSIVE METABOLIC PANEL
ALT: 13 U/L (ref 0–44)
AST: 17 U/L (ref 15–41)
Albumin: 2.7 g/dL — ABNORMAL LOW (ref 3.5–5.0)
Alkaline Phosphatase: 74 U/L (ref 38–126)
Anion gap: 8 (ref 5–15)
BUN: 15 mg/dL (ref 8–23)
CO2: 41 mmol/L — ABNORMAL HIGH (ref 22–32)
Calcium: 9.7 mg/dL (ref 8.9–10.3)
Chloride: 86 mmol/L — ABNORMAL LOW (ref 98–111)
Creatinine, Ser: 1.13 mg/dL — ABNORMAL HIGH (ref 0.44–1.00)
GFR, Estimated: 50 mL/min — ABNORMAL LOW (ref >60)
Glucose, Bld: 142 mg/dL — ABNORMAL HIGH (ref 70–99)
Potassium: 3.9 mmol/L (ref 3.5–5.1)
Sodium: 135 mmol/L (ref 135–145)
Total Bilirubin: 0.8 mg/dL (ref 0.3–1.2)
Total Protein: 6.6 g/dL (ref 6.5–8.1)

## 2021-03-23 LAB — RESP PANEL BY RT-PCR (FLU A&B, COVID) ARPGX2
Influenza A by PCR: NEGATIVE
Influenza B by PCR: NEGATIVE
SARS Coronavirus 2 by RT PCR: NEGATIVE

## 2021-03-23 LAB — TROPONIN I (HIGH SENSITIVITY)
Troponin I (High Sensitivity): 19 ng/L — ABNORMAL HIGH (ref <18)
Troponin I (High Sensitivity): 18 ng/L — ABNORMAL HIGH (ref <18)

## 2021-03-23 LAB — BRAIN NATRIURETIC PEPTIDE: B Natriuretic Peptide: 48 pg/mL (ref 0.0–100.0)

## 2021-03-23 MED ORDER — FUROSEMIDE 20 MG PO TABS: 20.0000 mg | ORAL_TABLET | Freq: Every day | ORAL | 0 refills | Status: DC

## 2021-03-23 MED ORDER — FUROSEMIDE 10 MG/ML IJ SOLN
20.0000 mg | Freq: Once | INTRAMUSCULAR | Status: AC
  Administered 2021-03-23: 20 mg via INTRAVENOUS
  Filled 2021-03-23: qty 4

## 2021-03-23 MED ORDER — IPRATROPIUM-ALBUTEROL 0.5-2.5 (3) MG/3ML IN SOLN
3.0000 mL | Freq: Once | RESPIRATORY_TRACT | Status: AC
  Administered 2021-03-23: 3 mL via RESPIRATORY_TRACT
  Filled 2021-03-23: qty 3

## 2021-03-23 NOTE — ED Notes (Cosign Needed Addendum)
Lasix I Re-eval I ambulate   Amaryllis Dyke, PA-C 03/23/21 0124    Amaryllis Dyke, PA-C 03/26/21 0144

## 2021-03-23 NOTE — ED Notes (Signed)
This nurse contacted pt's daughter, Toniann Fail for transport. Per Daughter ETA to pick-up pt around 0810 this morning.

## 2021-03-23 NOTE — Discharge Instructions (Addendum)
You were seen in the emergency department for shortness of breath.  Your work-up was overall reassuring.  Your chest x-ray did not show findings of pneumonia.  Your blood work overall looks similar to prior blood work you have had done, please be sure to have your kidney function rechecked very closely.   We would like you to increase your lasix to 40 mg daily (two tablets) for the next 3 days then resume taking 20 mg/day.  We would like you to follow-up with your primary care provider within 48 hours.  Please have your labs rechecked at that time.  Return to the emergency department immediately for new or worsening symptoms including but not limited to increased trouble breathing, dizziness, lightheadedness, passing out, coughing up blood, fever, chest pain, increased leg swelling, or any other concerns.

## 2021-03-24 DIAGNOSIS — I5032 Chronic diastolic (congestive) heart failure: Secondary | ICD-10-CM | POA: Diagnosis not present

## 2021-03-24 DIAGNOSIS — M7021 Olecranon bursitis, right elbow: Secondary | ICD-10-CM | POA: Diagnosis not present

## 2021-03-24 DIAGNOSIS — F339 Major depressive disorder, recurrent, unspecified: Secondary | ICD-10-CM | POA: Diagnosis not present

## 2021-03-24 DIAGNOSIS — J9611 Chronic respiratory failure with hypoxia: Secondary | ICD-10-CM | POA: Diagnosis not present

## 2021-03-24 DIAGNOSIS — N3946 Mixed incontinence: Secondary | ICD-10-CM | POA: Diagnosis not present

## 2021-03-24 DIAGNOSIS — R630 Anorexia: Secondary | ICD-10-CM | POA: Diagnosis not present

## 2021-03-24 DIAGNOSIS — J449 Chronic obstructive pulmonary disease, unspecified: Secondary | ICD-10-CM | POA: Diagnosis not present

## 2021-03-25 ENCOUNTER — Other Ambulatory Visit: Payer: Self-pay | Admitting: Student

## 2021-03-25 ENCOUNTER — Telehealth: Payer: Self-pay | Admitting: Adult Health Nurse Practitioner

## 2021-03-25 DIAGNOSIS — J439 Emphysema, unspecified: Secondary | ICD-10-CM | POA: Diagnosis not present

## 2021-03-25 DIAGNOSIS — N179 Acute kidney failure, unspecified: Secondary | ICD-10-CM | POA: Diagnosis not present

## 2021-03-25 DIAGNOSIS — N1831 Chronic kidney disease, stage 3a: Secondary | ICD-10-CM | POA: Diagnosis not present

## 2021-03-25 DIAGNOSIS — L89312 Pressure ulcer of right buttock, stage 2: Secondary | ICD-10-CM | POA: Diagnosis not present

## 2021-03-25 DIAGNOSIS — B999 Unspecified infectious disease: Secondary | ICD-10-CM

## 2021-03-25 DIAGNOSIS — I129 Hypertensive chronic kidney disease with stage 1 through stage 4 chronic kidney disease, or unspecified chronic kidney disease: Secondary | ICD-10-CM | POA: Diagnosis not present

## 2021-03-25 DIAGNOSIS — M869 Osteomyelitis, unspecified: Secondary | ICD-10-CM

## 2021-03-25 DIAGNOSIS — J9611 Chronic respiratory failure with hypoxia: Secondary | ICD-10-CM | POA: Diagnosis not present

## 2021-03-25 NOTE — Telephone Encounter (Signed)
Rec'd message that daughter had returned my call, returned call and spoke with daughter, briefly, regarding the Palliative referral.  Daughter was at work and wanted to call me back tomorrow on her lunch so that she would have time to talk with me and I told her that would fine.

## 2021-03-25 NOTE — Telephone Encounter (Signed)
Called patient's daughter, Basilia Jumbo, to offer to schedule a Palliative Consult, no answer - left message with reason for call along with my name and call back number.

## 2021-03-29 ENCOUNTER — Telehealth: Payer: Self-pay | Admitting: Adult Health Nurse Practitioner

## 2021-03-29 DIAGNOSIS — J9611 Chronic respiratory failure with hypoxia: Secondary | ICD-10-CM | POA: Diagnosis not present

## 2021-03-29 DIAGNOSIS — R911 Solitary pulmonary nodule: Secondary | ICD-10-CM | POA: Diagnosis not present

## 2021-03-29 DIAGNOSIS — K259 Gastric ulcer, unspecified as acute or chronic, without hemorrhage or perforation: Secondary | ICD-10-CM | POA: Diagnosis not present

## 2021-03-29 DIAGNOSIS — F419 Anxiety disorder, unspecified: Secondary | ICD-10-CM | POA: Diagnosis not present

## 2021-03-29 DIAGNOSIS — N179 Acute kidney failure, unspecified: Secondary | ICD-10-CM | POA: Diagnosis not present

## 2021-03-29 DIAGNOSIS — E782 Mixed hyperlipidemia: Secondary | ICD-10-CM | POA: Diagnosis not present

## 2021-03-29 DIAGNOSIS — I129 Hypertensive chronic kidney disease with stage 1 through stage 4 chronic kidney disease, or unspecified chronic kidney disease: Secondary | ICD-10-CM | POA: Diagnosis not present

## 2021-03-29 DIAGNOSIS — E876 Hypokalemia: Secondary | ICD-10-CM | POA: Diagnosis not present

## 2021-03-29 DIAGNOSIS — D62 Acute posthemorrhagic anemia: Secondary | ICD-10-CM | POA: Diagnosis not present

## 2021-03-29 DIAGNOSIS — M47814 Spondylosis without myelopathy or radiculopathy, thoracic region: Secondary | ICD-10-CM | POA: Diagnosis not present

## 2021-03-29 DIAGNOSIS — I959 Hypotension, unspecified: Secondary | ICD-10-CM | POA: Diagnosis not present

## 2021-03-29 DIAGNOSIS — I700 Atherosclerosis of aorta: Secondary | ICD-10-CM | POA: Diagnosis not present

## 2021-03-29 DIAGNOSIS — M542 Cervicalgia: Secondary | ICD-10-CM | POA: Diagnosis not present

## 2021-03-29 DIAGNOSIS — K2980 Duodenitis without bleeding: Secondary | ICD-10-CM | POA: Diagnosis not present

## 2021-03-29 DIAGNOSIS — F0280 Dementia in other diseases classified elsewhere without behavioral disturbance: Secondary | ICD-10-CM | POA: Diagnosis not present

## 2021-03-29 DIAGNOSIS — K449 Diaphragmatic hernia without obstruction or gangrene: Secondary | ICD-10-CM | POA: Diagnosis not present

## 2021-03-29 DIAGNOSIS — F32A Depression, unspecified: Secondary | ICD-10-CM | POA: Diagnosis not present

## 2021-03-29 DIAGNOSIS — G4730 Sleep apnea, unspecified: Secondary | ICD-10-CM | POA: Diagnosis not present

## 2021-03-29 DIAGNOSIS — J439 Emphysema, unspecified: Secondary | ICD-10-CM | POA: Diagnosis not present

## 2021-03-29 DIAGNOSIS — N1831 Chronic kidney disease, stage 3a: Secondary | ICD-10-CM | POA: Diagnosis not present

## 2021-03-29 DIAGNOSIS — L89312 Pressure ulcer of right buttock, stage 2: Secondary | ICD-10-CM | POA: Diagnosis not present

## 2021-03-29 DIAGNOSIS — E039 Hypothyroidism, unspecified: Secondary | ICD-10-CM | POA: Diagnosis not present

## 2021-03-29 DIAGNOSIS — K2100 Gastro-esophageal reflux disease with esophagitis, without bleeding: Secondary | ICD-10-CM | POA: Diagnosis not present

## 2021-03-29 DIAGNOSIS — R350 Frequency of micturition: Secondary | ICD-10-CM | POA: Diagnosis not present

## 2021-03-29 DIAGNOSIS — K5792 Diverticulitis of intestine, part unspecified, without perforation or abscess without bleeding: Secondary | ICD-10-CM | POA: Diagnosis not present

## 2021-03-29 NOTE — Telephone Encounter (Signed)
Called and spoke with patient's daughter, Holly [name], about the Palliative referral.  Daughter had told me when I initially had talked with her last week, that if her mother should transition to Hospice she has requested that the patient go to the [facility], due to she had a very close friend that worked there and she would be a good support for her during this time.  I discussed this with the daughter again and told her that we would be more than happy to start Palliative services with the patient but that it may be better overall if she would stay with one company if patient should transition to Hospice and she was in agreement with this.  Daughter is going to speak with her friend that works at [facility] to make sure that they service the area that patient lives in and then she will call me back.  I told daughter that once she calls me back that I would contact Harlan Stains, NP's office to let them know - daughter took my name and number and call me back.

## 2021-03-30 DIAGNOSIS — N1831 Chronic kidney disease, stage 3a: Secondary | ICD-10-CM | POA: Diagnosis not present

## 2021-03-30 DIAGNOSIS — N179 Acute kidney failure, unspecified: Secondary | ICD-10-CM | POA: Diagnosis not present

## 2021-03-30 DIAGNOSIS — L89312 Pressure ulcer of right buttock, stage 2: Secondary | ICD-10-CM | POA: Diagnosis not present

## 2021-03-30 DIAGNOSIS — J439 Emphysema, unspecified: Secondary | ICD-10-CM | POA: Diagnosis not present

## 2021-03-30 DIAGNOSIS — I129 Hypertensive chronic kidney disease with stage 1 through stage 4 chronic kidney disease, or unspecified chronic kidney disease: Secondary | ICD-10-CM | POA: Diagnosis not present

## 2021-03-30 DIAGNOSIS — J9611 Chronic respiratory failure with hypoxia: Secondary | ICD-10-CM | POA: Diagnosis not present

## 2021-04-01 DIAGNOSIS — L89312 Pressure ulcer of right buttock, stage 2: Secondary | ICD-10-CM | POA: Diagnosis not present

## 2021-04-01 DIAGNOSIS — J9611 Chronic respiratory failure with hypoxia: Secondary | ICD-10-CM | POA: Diagnosis not present

## 2021-04-01 DIAGNOSIS — N1831 Chronic kidney disease, stage 3a: Secondary | ICD-10-CM | POA: Diagnosis not present

## 2021-04-01 DIAGNOSIS — J439 Emphysema, unspecified: Secondary | ICD-10-CM | POA: Diagnosis not present

## 2021-04-01 DIAGNOSIS — I129 Hypertensive chronic kidney disease with stage 1 through stage 4 chronic kidney disease, or unspecified chronic kidney disease: Secondary | ICD-10-CM | POA: Diagnosis not present

## 2021-04-01 DIAGNOSIS — N179 Acute kidney failure, unspecified: Secondary | ICD-10-CM | POA: Diagnosis not present

## 2021-04-05 ENCOUNTER — Telehealth: Payer: Self-pay | Admitting: Adult Health Nurse Practitioner

## 2021-04-05 DIAGNOSIS — N179 Acute kidney failure, unspecified: Secondary | ICD-10-CM | POA: Diagnosis not present

## 2021-04-05 DIAGNOSIS — I129 Hypertensive chronic kidney disease with stage 1 through stage 4 chronic kidney disease, or unspecified chronic kidney disease: Secondary | ICD-10-CM | POA: Diagnosis not present

## 2021-04-05 DIAGNOSIS — J439 Emphysema, unspecified: Secondary | ICD-10-CM | POA: Diagnosis not present

## 2021-04-05 DIAGNOSIS — J9611 Chronic respiratory failure with hypoxia: Secondary | ICD-10-CM | POA: Diagnosis not present

## 2021-04-05 DIAGNOSIS — N1831 Chronic kidney disease, stage 3a: Secondary | ICD-10-CM | POA: Diagnosis not present

## 2021-04-05 DIAGNOSIS — L89312 Pressure ulcer of right buttock, stage 2: Secondary | ICD-10-CM | POA: Diagnosis not present

## 2021-04-06 DIAGNOSIS — N1831 Chronic kidney disease, stage 3a: Secondary | ICD-10-CM | POA: Diagnosis not present

## 2021-04-06 DIAGNOSIS — N183 Chronic kidney disease, stage 3 unspecified: Secondary | ICD-10-CM | POA: Diagnosis not present

## 2021-04-06 DIAGNOSIS — I5033 Acute on chronic diastolic (congestive) heart failure: Secondary | ICD-10-CM | POA: Diagnosis not present

## 2021-04-06 DIAGNOSIS — J449 Chronic obstructive pulmonary disease, unspecified: Secondary | ICD-10-CM | POA: Diagnosis not present

## 2021-04-06 DIAGNOSIS — J9611 Chronic respiratory failure with hypoxia: Secondary | ICD-10-CM | POA: Diagnosis not present

## 2021-04-06 DIAGNOSIS — I129 Hypertensive chronic kidney disease with stage 1 through stage 4 chronic kidney disease, or unspecified chronic kidney disease: Secondary | ICD-10-CM | POA: Diagnosis not present

## 2021-04-06 DIAGNOSIS — N179 Acute kidney failure, unspecified: Secondary | ICD-10-CM | POA: Diagnosis not present

## 2021-04-06 DIAGNOSIS — G4700 Insomnia, unspecified: Secondary | ICD-10-CM | POA: Diagnosis not present

## 2021-04-06 DIAGNOSIS — I10 Essential (primary) hypertension: Secondary | ICD-10-CM | POA: Diagnosis not present

## 2021-04-06 DIAGNOSIS — F339 Major depressive disorder, recurrent, unspecified: Secondary | ICD-10-CM | POA: Diagnosis not present

## 2021-04-06 DIAGNOSIS — E785 Hyperlipidemia, unspecified: Secondary | ICD-10-CM | POA: Diagnosis not present

## 2021-04-06 DIAGNOSIS — C3490 Malignant neoplasm of unspecified part of unspecified bronchus or lung: Secondary | ICD-10-CM | POA: Diagnosis not present

## 2021-04-06 DIAGNOSIS — F325 Major depressive disorder, single episode, in full remission: Secondary | ICD-10-CM | POA: Diagnosis not present

## 2021-04-06 DIAGNOSIS — J439 Emphysema, unspecified: Secondary | ICD-10-CM | POA: Diagnosis not present

## 2021-04-06 DIAGNOSIS — L89312 Pressure ulcer of right buttock, stage 2: Secondary | ICD-10-CM | POA: Diagnosis not present

## 2021-04-06 DIAGNOSIS — E890 Postprocedural hypothyroidism: Secondary | ICD-10-CM | POA: Diagnosis not present

## 2021-04-13 ENCOUNTER — Other Ambulatory Visit: Payer: Self-pay | Admitting: Gastroenterology

## 2021-04-13 ENCOUNTER — Inpatient Hospital Stay: Admission: RE | Admit: 2021-04-13 | Payer: Medicare Other | Source: Ambulatory Visit

## 2021-04-13 DIAGNOSIS — Z8719 Personal history of other diseases of the digestive system: Secondary | ICD-10-CM | POA: Diagnosis not present

## 2021-04-13 DIAGNOSIS — Z86010 Personal history of colonic polyps: Secondary | ICD-10-CM | POA: Diagnosis not present

## 2021-04-13 DIAGNOSIS — Z8709 Personal history of other diseases of the respiratory system: Secondary | ICD-10-CM | POA: Diagnosis not present

## 2021-04-13 DIAGNOSIS — Z8711 Personal history of peptic ulcer disease: Secondary | ICD-10-CM | POA: Diagnosis not present

## 2021-04-14 DIAGNOSIS — N1831 Chronic kidney disease, stage 3a: Secondary | ICD-10-CM | POA: Diagnosis not present

## 2021-04-14 DIAGNOSIS — J439 Emphysema, unspecified: Secondary | ICD-10-CM | POA: Diagnosis not present

## 2021-04-14 DIAGNOSIS — I129 Hypertensive chronic kidney disease with stage 1 through stage 4 chronic kidney disease, or unspecified chronic kidney disease: Secondary | ICD-10-CM | POA: Diagnosis not present

## 2021-04-14 DIAGNOSIS — J9611 Chronic respiratory failure with hypoxia: Secondary | ICD-10-CM | POA: Diagnosis not present

## 2021-04-14 DIAGNOSIS — L89312 Pressure ulcer of right buttock, stage 2: Secondary | ICD-10-CM | POA: Diagnosis not present

## 2021-04-14 DIAGNOSIS — N179 Acute kidney failure, unspecified: Secondary | ICD-10-CM | POA: Diagnosis not present

## 2021-04-16 DIAGNOSIS — N179 Acute kidney failure, unspecified: Secondary | ICD-10-CM | POA: Diagnosis not present

## 2021-04-16 DIAGNOSIS — J439 Emphysema, unspecified: Secondary | ICD-10-CM | POA: Diagnosis not present

## 2021-04-16 DIAGNOSIS — L89312 Pressure ulcer of right buttock, stage 2: Secondary | ICD-10-CM | POA: Diagnosis not present

## 2021-04-16 DIAGNOSIS — N1831 Chronic kidney disease, stage 3a: Secondary | ICD-10-CM | POA: Diagnosis not present

## 2021-04-16 DIAGNOSIS — I129 Hypertensive chronic kidney disease with stage 1 through stage 4 chronic kidney disease, or unspecified chronic kidney disease: Secondary | ICD-10-CM | POA: Diagnosis not present

## 2021-04-16 DIAGNOSIS — J9611 Chronic respiratory failure with hypoxia: Secondary | ICD-10-CM | POA: Diagnosis not present

## 2021-04-20 DIAGNOSIS — Z79899 Other long term (current) drug therapy: Secondary | ICD-10-CM | POA: Diagnosis not present

## 2021-04-20 DIAGNOSIS — J9611 Chronic respiratory failure with hypoxia: Secondary | ICD-10-CM | POA: Diagnosis not present

## 2021-04-20 DIAGNOSIS — G4733 Obstructive sleep apnea (adult) (pediatric): Secondary | ICD-10-CM | POA: Diagnosis not present

## 2021-04-20 DIAGNOSIS — F411 Generalized anxiety disorder: Secondary | ICD-10-CM | POA: Diagnosis not present

## 2021-04-20 DIAGNOSIS — D638 Anemia in other chronic diseases classified elsewhere: Secondary | ICD-10-CM | POA: Diagnosis not present

## 2021-04-20 DIAGNOSIS — R5383 Other fatigue: Secondary | ICD-10-CM | POA: Diagnosis not present

## 2021-04-20 DIAGNOSIS — R601 Generalized edema: Secondary | ICD-10-CM | POA: Diagnosis not present

## 2021-04-20 DIAGNOSIS — R918 Other nonspecific abnormal finding of lung field: Secondary | ICD-10-CM | POA: Diagnosis not present

## 2021-04-20 DIAGNOSIS — J449 Chronic obstructive pulmonary disease, unspecified: Secondary | ICD-10-CM | POA: Diagnosis not present

## 2021-04-20 DIAGNOSIS — R609 Edema, unspecified: Secondary | ICD-10-CM | POA: Diagnosis not present

## 2021-04-21 DIAGNOSIS — M1612 Unilateral primary osteoarthritis, left hip: Secondary | ICD-10-CM | POA: Diagnosis not present

## 2021-04-21 DIAGNOSIS — M1611 Unilateral primary osteoarthritis, right hip: Secondary | ICD-10-CM | POA: Diagnosis not present

## 2021-04-21 DIAGNOSIS — C3490 Malignant neoplasm of unspecified part of unspecified bronchus or lung: Secondary | ICD-10-CM | POA: Diagnosis not present

## 2021-04-21 DIAGNOSIS — F339 Major depressive disorder, recurrent, unspecified: Secondary | ICD-10-CM | POA: Diagnosis not present

## 2021-04-21 DIAGNOSIS — E785 Hyperlipidemia, unspecified: Secondary | ICD-10-CM | POA: Diagnosis not present

## 2021-04-21 DIAGNOSIS — N183 Chronic kidney disease, stage 3 unspecified: Secondary | ICD-10-CM | POA: Diagnosis not present

## 2021-04-21 DIAGNOSIS — I129 Hypertensive chronic kidney disease with stage 1 through stage 4 chronic kidney disease, or unspecified chronic kidney disease: Secondary | ICD-10-CM | POA: Diagnosis not present

## 2021-04-21 DIAGNOSIS — J449 Chronic obstructive pulmonary disease, unspecified: Secondary | ICD-10-CM | POA: Diagnosis not present

## 2021-04-21 DIAGNOSIS — F325 Major depressive disorder, single episode, in full remission: Secondary | ICD-10-CM | POA: Diagnosis not present

## 2021-04-21 DIAGNOSIS — E890 Postprocedural hypothyroidism: Secondary | ICD-10-CM | POA: Diagnosis not present

## 2021-04-21 DIAGNOSIS — I5033 Acute on chronic diastolic (congestive) heart failure: Secondary | ICD-10-CM | POA: Diagnosis not present

## 2021-04-21 DIAGNOSIS — I10 Essential (primary) hypertension: Secondary | ICD-10-CM | POA: Diagnosis not present

## 2021-04-22 ENCOUNTER — Telehealth: Payer: Self-pay | Admitting: Adult Health Nurse Practitioner

## 2021-04-22 NOTE — Telephone Encounter (Signed)
Attempted to contact patient's daughter, Basilia Jumbo, to see if she wanted to pursue Palliative services with [place] and schedule a visit or not - requested a return call from her by tomorrow if possible to let me know, so that I could update the referring MD.  Left my name and call back number.

## 2021-04-23 DIAGNOSIS — J9611 Chronic respiratory failure with hypoxia: Secondary | ICD-10-CM | POA: Diagnosis not present

## 2021-04-23 DIAGNOSIS — N1831 Chronic kidney disease, stage 3a: Secondary | ICD-10-CM | POA: Diagnosis not present

## 2021-04-23 DIAGNOSIS — L89312 Pressure ulcer of right buttock, stage 2: Secondary | ICD-10-CM | POA: Diagnosis not present

## 2021-04-23 DIAGNOSIS — N179 Acute kidney failure, unspecified: Secondary | ICD-10-CM | POA: Diagnosis not present

## 2021-04-23 DIAGNOSIS — J439 Emphysema, unspecified: Secondary | ICD-10-CM | POA: Diagnosis not present

## 2021-04-23 DIAGNOSIS — I129 Hypertensive chronic kidney disease with stage 1 through stage 4 chronic kidney disease, or unspecified chronic kidney disease: Secondary | ICD-10-CM | POA: Diagnosis not present

## 2021-04-26 DIAGNOSIS — N1831 Chronic kidney disease, stage 3a: Secondary | ICD-10-CM | POA: Diagnosis not present

## 2021-04-26 DIAGNOSIS — N179 Acute kidney failure, unspecified: Secondary | ICD-10-CM | POA: Diagnosis not present

## 2021-04-26 DIAGNOSIS — L89312 Pressure ulcer of right buttock, stage 2: Secondary | ICD-10-CM | POA: Diagnosis not present

## 2021-04-26 DIAGNOSIS — J9611 Chronic respiratory failure with hypoxia: Secondary | ICD-10-CM | POA: Diagnosis not present

## 2021-04-26 DIAGNOSIS — J439 Emphysema, unspecified: Secondary | ICD-10-CM | POA: Diagnosis not present

## 2021-04-26 DIAGNOSIS — I129 Hypertensive chronic kidney disease with stage 1 through stage 4 chronic kidney disease, or unspecified chronic kidney disease: Secondary | ICD-10-CM | POA: Diagnosis not present

## 2021-04-27 NOTE — Telephone Encounter (Signed)
Spoke with patient's daughter, Basilia Jumbo, to see if she wanted to schedule the Palliative Consult, daughter stated that she was still at work and would call me back.

## 2021-05-09 DIAGNOSIS — Z20828 Contact with and (suspected) exposure to other viral communicable diseases: Secondary | ICD-10-CM | POA: Diagnosis not present

## 2021-05-09 DIAGNOSIS — J111 Influenza due to unidentified influenza virus with other respiratory manifestations: Secondary | ICD-10-CM | POA: Diagnosis not present

## 2021-05-09 DIAGNOSIS — R059 Cough, unspecified: Secondary | ICD-10-CM | POA: Diagnosis not present

## 2021-05-14 DIAGNOSIS — N183 Chronic kidney disease, stage 3 unspecified: Secondary | ICD-10-CM | POA: Diagnosis not present

## 2021-05-14 DIAGNOSIS — D6489 Other specified anemias: Secondary | ICD-10-CM | POA: Diagnosis not present

## 2021-05-17 NOTE — Progress Notes (Addendum)
Called pt. To do [facility] preop call she stated she is cancelling due to testing positive for COVID 05-11-21. Called [facility] and let them know and also asked pt. To give them a call.

## 2021-05-18 DIAGNOSIS — R918 Other nonspecific abnormal finding of lung field: Secondary | ICD-10-CM | POA: Diagnosis not present

## 2021-05-18 DIAGNOSIS — R5383 Other fatigue: Secondary | ICD-10-CM | POA: Diagnosis not present

## 2021-05-18 DIAGNOSIS — G4733 Obstructive sleep apnea (adult) (pediatric): Secondary | ICD-10-CM | POA: Diagnosis not present

## 2021-05-18 DIAGNOSIS — F411 Generalized anxiety disorder: Secondary | ICD-10-CM | POA: Diagnosis not present

## 2021-05-18 DIAGNOSIS — J9611 Chronic respiratory failure with hypoxia: Secondary | ICD-10-CM | POA: Diagnosis not present

## 2021-05-18 DIAGNOSIS — R601 Generalized edema: Secondary | ICD-10-CM | POA: Diagnosis not present

## 2021-05-18 DIAGNOSIS — D638 Anemia in other chronic diseases classified elsewhere: Secondary | ICD-10-CM | POA: Diagnosis not present

## 2021-05-18 DIAGNOSIS — J449 Chronic obstructive pulmonary disease, unspecified: Secondary | ICD-10-CM | POA: Diagnosis not present

## 2021-05-19 NOTE — Progress Notes (Addendum)
PT tested positive for covid on 05/11/21.  And for flu.  States she has attempted to  call and does not get an answer.  Was unable to tell me number she is calling.  PT reports still being congested and wants to reschedule colonoscopy on 05/25/2021 once feeling better.   Informed pt that would call Dr Alessandra Bevels office and let them be aware.   Spoke with office and they are aware.   They are to cancel her procedure and call pt.

## 2021-05-25 ENCOUNTER — Ambulatory Visit (HOSPITAL_COMMUNITY): Admission: RE | Admit: 2021-05-25 | Payer: Medicare Other | Source: Home / Self Care | Admitting: Gastroenterology

## 2021-05-25 ENCOUNTER — Encounter (HOSPITAL_COMMUNITY): Admission: RE | Payer: Self-pay | Source: Home / Self Care

## 2021-05-25 SURGERY — COLONOSCOPY WITH PROPOFOL
Anesthesia: Monitor Anesthesia Care

## 2021-06-07 DIAGNOSIS — M1612 Unilateral primary osteoarthritis, left hip: Secondary | ICD-10-CM | POA: Diagnosis not present

## 2021-06-07 DIAGNOSIS — F339 Major depressive disorder, recurrent, unspecified: Secondary | ICD-10-CM | POA: Diagnosis not present

## 2021-06-07 DIAGNOSIS — E890 Postprocedural hypothyroidism: Secondary | ICD-10-CM | POA: Diagnosis not present

## 2021-06-07 DIAGNOSIS — M1611 Unilateral primary osteoarthritis, right hip: Secondary | ICD-10-CM | POA: Diagnosis not present

## 2021-06-07 DIAGNOSIS — G4700 Insomnia, unspecified: Secondary | ICD-10-CM | POA: Diagnosis not present

## 2021-06-07 DIAGNOSIS — N183 Chronic kidney disease, stage 3 unspecified: Secondary | ICD-10-CM | POA: Diagnosis not present

## 2021-06-07 DIAGNOSIS — I10 Essential (primary) hypertension: Secondary | ICD-10-CM | POA: Diagnosis not present

## 2021-06-07 DIAGNOSIS — C3490 Malignant neoplasm of unspecified part of unspecified bronchus or lung: Secondary | ICD-10-CM | POA: Diagnosis not present

## 2021-06-07 DIAGNOSIS — I129 Hypertensive chronic kidney disease with stage 1 through stage 4 chronic kidney disease, or unspecified chronic kidney disease: Secondary | ICD-10-CM | POA: Diagnosis not present

## 2021-06-07 DIAGNOSIS — J449 Chronic obstructive pulmonary disease, unspecified: Secondary | ICD-10-CM | POA: Diagnosis not present

## 2021-06-07 DIAGNOSIS — I5033 Acute on chronic diastolic (congestive) heart failure: Secondary | ICD-10-CM | POA: Diagnosis not present

## 2021-06-07 DIAGNOSIS — E785 Hyperlipidemia, unspecified: Secondary | ICD-10-CM | POA: Diagnosis not present

## 2021-07-06 DIAGNOSIS — E785 Hyperlipidemia, unspecified: Secondary | ICD-10-CM | POA: Diagnosis not present

## 2021-07-06 DIAGNOSIS — E890 Postprocedural hypothyroidism: Secondary | ICD-10-CM | POA: Diagnosis not present

## 2021-07-06 DIAGNOSIS — I5033 Acute on chronic diastolic (congestive) heart failure: Secondary | ICD-10-CM | POA: Diagnosis not present

## 2021-07-06 DIAGNOSIS — I5032 Chronic diastolic (congestive) heart failure: Secondary | ICD-10-CM | POA: Diagnosis not present

## 2021-07-06 DIAGNOSIS — I10 Essential (primary) hypertension: Secondary | ICD-10-CM | POA: Diagnosis not present

## 2021-07-06 DIAGNOSIS — N183 Chronic kidney disease, stage 3 unspecified: Secondary | ICD-10-CM | POA: Diagnosis not present

## 2021-07-06 DIAGNOSIS — J449 Chronic obstructive pulmonary disease, unspecified: Secondary | ICD-10-CM | POA: Diagnosis not present

## 2021-07-06 DIAGNOSIS — G4700 Insomnia, unspecified: Secondary | ICD-10-CM | POA: Diagnosis not present

## 2021-07-06 DIAGNOSIS — F339 Major depressive disorder, recurrent, unspecified: Secondary | ICD-10-CM | POA: Diagnosis not present

## 2021-07-21 DIAGNOSIS — Z79899 Other long term (current) drug therapy: Secondary | ICD-10-CM | POA: Diagnosis not present

## 2021-07-21 DIAGNOSIS — J449 Chronic obstructive pulmonary disease, unspecified: Secondary | ICD-10-CM | POA: Diagnosis not present

## 2021-07-21 DIAGNOSIS — F411 Generalized anxiety disorder: Secondary | ICD-10-CM | POA: Diagnosis not present

## 2021-07-21 DIAGNOSIS — R5383 Other fatigue: Secondary | ICD-10-CM | POA: Diagnosis not present

## 2021-07-21 DIAGNOSIS — G4733 Obstructive sleep apnea (adult) (pediatric): Secondary | ICD-10-CM | POA: Diagnosis not present

## 2021-07-21 DIAGNOSIS — J9611 Chronic respiratory failure with hypoxia: Secondary | ICD-10-CM | POA: Diagnosis not present

## 2021-07-21 DIAGNOSIS — D638 Anemia in other chronic diseases classified elsewhere: Secondary | ICD-10-CM | POA: Diagnosis not present

## 2021-07-21 DIAGNOSIS — R918 Other nonspecific abnormal finding of lung field: Secondary | ICD-10-CM | POA: Diagnosis not present

## 2021-07-21 DIAGNOSIS — Z8616 Personal history of COVID-19: Secondary | ICD-10-CM | POA: Diagnosis not present

## 2021-07-23 ENCOUNTER — Inpatient Hospital Stay (HOSPITAL_COMMUNITY)
Admission: EM | Admit: 2021-07-23 | Discharge: 2021-08-02 | DRG: 871 | Disposition: A | Discharge status: Home Health | Payer: Medicare Other | Attending: Family Medicine | Admitting: Family Medicine

## 2021-07-23 ENCOUNTER — Emergency Department (HOSPITAL_COMMUNITY): Payer: Medicare Other

## 2021-07-23 DIAGNOSIS — E782 Mixed hyperlipidemia: Secondary | ICD-10-CM | POA: Diagnosis present

## 2021-07-23 DIAGNOSIS — N179 Acute kidney failure, unspecified: Secondary | ICD-10-CM | POA: Diagnosis not present

## 2021-07-23 DIAGNOSIS — Z66 Do not resuscitate: Secondary | ICD-10-CM | POA: Diagnosis not present

## 2021-07-23 DIAGNOSIS — J9602 Acute respiratory failure with hypercapnia: Secondary | ICD-10-CM | POA: Diagnosis not present

## 2021-07-23 DIAGNOSIS — D649 Anemia, unspecified: Secondary | ICD-10-CM | POA: Diagnosis not present

## 2021-07-23 DIAGNOSIS — A419 Sepsis, unspecified organism: Secondary | ICD-10-CM

## 2021-07-23 DIAGNOSIS — Z801 Family history of malignant neoplasm of trachea, bronchus and lung: Secondary | ICD-10-CM

## 2021-07-23 DIAGNOSIS — H5702 Anisocoria: Secondary | ICD-10-CM | POA: Diagnosis present

## 2021-07-23 DIAGNOSIS — R0989 Other specified symptoms and signs involving the circulatory and respiratory systems: Secondary | ICD-10-CM | POA: Diagnosis not present

## 2021-07-23 DIAGNOSIS — J9622 Acute and chronic respiratory failure with hypercapnia: Secondary | ICD-10-CM | POA: Diagnosis present

## 2021-07-23 DIAGNOSIS — I5033 Acute on chronic diastolic (congestive) heart failure: Secondary | ICD-10-CM | POA: Diagnosis present

## 2021-07-23 DIAGNOSIS — R069 Unspecified abnormalities of breathing: Secondary | ICD-10-CM | POA: Diagnosis not present

## 2021-07-23 DIAGNOSIS — Z4682 Encounter for fitting and adjustment of non-vascular catheter: Secondary | ICD-10-CM | POA: Diagnosis not present

## 2021-07-23 DIAGNOSIS — C3412 Malignant neoplasm of upper lobe, left bronchus or lung: Secondary | ICD-10-CM | POA: Diagnosis present

## 2021-07-23 DIAGNOSIS — Z825 Family history of asthma and other chronic lower respiratory diseases: Secondary | ICD-10-CM

## 2021-07-23 DIAGNOSIS — D329 Benign neoplasm of meninges, unspecified: Secondary | ICD-10-CM | POA: Diagnosis present

## 2021-07-23 DIAGNOSIS — Z79899 Other long term (current) drug therapy: Secondary | ICD-10-CM

## 2021-07-23 DIAGNOSIS — J9621 Acute and chronic respiratory failure with hypoxia: Secondary | ICD-10-CM | POA: Diagnosis present

## 2021-07-23 DIAGNOSIS — Z515 Encounter for palliative care: Secondary | ICD-10-CM | POA: Diagnosis not present

## 2021-07-23 DIAGNOSIS — I10 Essential (primary) hypertension: Secondary | ICD-10-CM | POA: Diagnosis not present

## 2021-07-23 DIAGNOSIS — R0902 Hypoxemia: Secondary | ICD-10-CM | POA: Diagnosis not present

## 2021-07-23 DIAGNOSIS — Z87891 Personal history of nicotine dependence: Secondary | ICD-10-CM

## 2021-07-23 DIAGNOSIS — E873 Alkalosis: Secondary | ICD-10-CM | POA: Diagnosis present

## 2021-07-23 DIAGNOSIS — R739 Hyperglycemia, unspecified: Secondary | ICD-10-CM | POA: Diagnosis not present

## 2021-07-23 DIAGNOSIS — I110 Hypertensive heart disease with heart failure: Secondary | ICD-10-CM | POA: Diagnosis present

## 2021-07-23 DIAGNOSIS — Z452 Encounter for adjustment and management of vascular access device: Secondary | ICD-10-CM

## 2021-07-23 DIAGNOSIS — Z7189 Other specified counseling: Secondary | ICD-10-CM

## 2021-07-23 DIAGNOSIS — Z888 Allergy status to other drugs, medicaments and biological substances status: Secondary | ICD-10-CM

## 2021-07-23 DIAGNOSIS — J9601 Acute respiratory failure with hypoxia: Secondary | ICD-10-CM

## 2021-07-23 DIAGNOSIS — F419 Anxiety disorder, unspecified: Secondary | ICD-10-CM | POA: Diagnosis present

## 2021-07-23 DIAGNOSIS — R918 Other nonspecific abnormal finding of lung field: Secondary | ICD-10-CM | POA: Diagnosis not present

## 2021-07-23 DIAGNOSIS — G4733 Obstructive sleep apnea (adult) (pediatric): Secondary | ICD-10-CM | POA: Diagnosis present

## 2021-07-23 DIAGNOSIS — R6521 Severe sepsis with septic shock: Secondary | ICD-10-CM | POA: Diagnosis present

## 2021-07-23 DIAGNOSIS — G9341 Metabolic encephalopathy: Secondary | ICD-10-CM | POA: Diagnosis present

## 2021-07-23 DIAGNOSIS — R0689 Other abnormalities of breathing: Secondary | ICD-10-CM | POA: Diagnosis not present

## 2021-07-23 DIAGNOSIS — J439 Emphysema, unspecified: Secondary | ICD-10-CM | POA: Diagnosis present

## 2021-07-23 DIAGNOSIS — E876 Hypokalemia: Secondary | ICD-10-CM | POA: Diagnosis not present

## 2021-07-23 DIAGNOSIS — D500 Iron deficiency anemia secondary to blood loss (chronic): Secondary | ICD-10-CM | POA: Diagnosis not present

## 2021-07-23 DIAGNOSIS — Z20822 Contact with and (suspected) exposure to covid-19: Secondary | ICD-10-CM | POA: Diagnosis present

## 2021-07-23 DIAGNOSIS — J9690 Respiratory failure, unspecified, unspecified whether with hypoxia or hypercapnia: Secondary | ICD-10-CM | POA: Diagnosis not present

## 2021-07-23 DIAGNOSIS — Z9981 Dependence on supplemental oxygen: Secondary | ICD-10-CM

## 2021-07-23 DIAGNOSIS — K5730 Diverticulosis of large intestine without perforation or abscess without bleeding: Secondary | ICD-10-CM | POA: Diagnosis not present

## 2021-07-23 DIAGNOSIS — D62 Acute posthemorrhagic anemia: Secondary | ICD-10-CM | POA: Diagnosis not present

## 2021-07-23 DIAGNOSIS — R4182 Altered mental status, unspecified: Secondary | ICD-10-CM | POA: Diagnosis not present

## 2021-07-23 DIAGNOSIS — Z8249 Family history of ischemic heart disease and other diseases of the circulatory system: Secondary | ICD-10-CM

## 2021-07-23 DIAGNOSIS — K921 Melena: Secondary | ICD-10-CM | POA: Diagnosis not present

## 2021-07-23 DIAGNOSIS — Z8711 Personal history of peptic ulcer disease: Secondary | ICD-10-CM | POA: Diagnosis not present

## 2021-07-23 DIAGNOSIS — J9620 Acute and chronic respiratory failure, unspecified whether with hypoxia or hypercapnia: Secondary | ICD-10-CM | POA: Diagnosis present

## 2021-07-23 DIAGNOSIS — R404 Transient alteration of awareness: Secondary | ICD-10-CM | POA: Diagnosis not present

## 2021-07-23 DIAGNOSIS — E890 Postprocedural hypothyroidism: Secondary | ICD-10-CM | POA: Diagnosis present

## 2021-07-23 DIAGNOSIS — J9811 Atelectasis: Secondary | ICD-10-CM | POA: Diagnosis not present

## 2021-07-23 DIAGNOSIS — Z885 Allergy status to narcotic agent status: Secondary | ICD-10-CM

## 2021-07-23 DIAGNOSIS — Z85820 Personal history of malignant melanoma of skin: Secondary | ICD-10-CM

## 2021-07-23 DIAGNOSIS — Z8614 Personal history of Methicillin resistant Staphylococcus aureus infection: Secondary | ICD-10-CM

## 2021-07-23 DIAGNOSIS — Z90710 Acquired absence of both cervix and uterus: Secondary | ICD-10-CM

## 2021-07-23 DIAGNOSIS — Z79890 Hormone replacement therapy (postmenopausal): Secondary | ICD-10-CM

## 2021-07-23 DIAGNOSIS — C3491 Malignant neoplasm of unspecified part of right bronchus or lung: Secondary | ICD-10-CM

## 2021-07-23 DIAGNOSIS — R4020 Unspecified coma: Secondary | ICD-10-CM | POA: Diagnosis not present

## 2021-07-23 DIAGNOSIS — Z7982 Long term (current) use of aspirin: Secondary | ICD-10-CM

## 2021-07-23 DIAGNOSIS — L97919 Non-pressure chronic ulcer of unspecified part of right lower leg with unspecified severity: Secondary | ICD-10-CM | POA: Diagnosis present

## 2021-07-23 LAB — COMPREHENSIVE METABOLIC PANEL
ALT: 9 U/L (ref 0–44)
AST: 12 U/L — ABNORMAL LOW (ref 15–41)
Albumin: 2.7 g/dL — ABNORMAL LOW (ref 3.5–5.0)
Alkaline Phosphatase: 92 U/L (ref 38–126)
BUN: 18 mg/dL (ref 8–23)
CO2: >45 mmol/L — ABNORMAL HIGH (ref 22–32)
Calcium: 9.9 mg/dL (ref 8.9–10.3)
Chloride: 90 mmol/L — ABNORMAL LOW (ref 98–111)
Creatinine, Ser: 0.87 mg/dL (ref 0.44–1.00)
GFR, Estimated: >60 mL/min (ref >60)
Glucose, Bld: 171 mg/dL — ABNORMAL HIGH (ref 70–99)
Potassium: 4.5 mmol/L (ref 3.5–5.1)
Sodium: 142 mmol/L (ref 135–145)
Total Bilirubin: 0.6 mg/dL (ref 0.3–1.2)
Total Protein: 6.7 g/dL (ref 6.5–8.1)

## 2021-07-23 LAB — I-STAT VENOUS BLOOD GAS, ED
Acid-Base Excess: 18 mmol/L — ABNORMAL HIGH (ref 0.0–2.0)
Bicarbonate: 47.2 mmol/L — ABNORMAL HIGH (ref 20.0–28.0)
Calcium, Ion: 1.25 mmol/L (ref 1.15–1.40)
HCT: 24 % — ABNORMAL LOW (ref 36.0–46.0)
Hemoglobin: 8.2 g/dL — ABNORMAL LOW (ref 12.0–15.0)
O2 Saturation: 72 %
Potassium: 4.3 mmol/L (ref 3.5–5.1)
Sodium: 145 mmol/L (ref 135–145)
TCO2: >50 mmol/L — ABNORMAL HIGH (ref 22–32)
pCO2, Ven: 95.6 mmHg (ref 44.0–60.0)
pH, Ven: 7.302 (ref 7.250–7.430)
pO2, Ven: 45 mmHg (ref 32.0–45.0)

## 2021-07-23 LAB — URINALYSIS, ROUTINE W REFLEX MICROSCOPIC
Bilirubin Urine: NEGATIVE
Glucose, UA: NEGATIVE mg/dL
Hgb urine dipstick: NEGATIVE
Ketones, ur: NEGATIVE mg/dL
Leukocytes,Ua: NEGATIVE
Nitrite: NEGATIVE
Protein, ur: 30 mg/dL — AB
Specific Gravity, Urine: 1.013 (ref 1.005–1.030)
pH: 6 (ref 5.0–8.0)

## 2021-07-23 LAB — CBC WITH DIFFERENTIAL/PLATELET
Abs Immature Granulocytes: 0.26 10*3/uL — ABNORMAL HIGH (ref 0.00–0.07)
Basophils Absolute: 0 10*3/uL (ref 0.0–0.1)
Basophils Relative: 0 %
Eosinophils Absolute: 0.1 10*3/uL (ref 0.0–0.5)
Eosinophils Relative: 1 %
HCT: 35.2 % — ABNORMAL LOW (ref 36.0–46.0)
Hemoglobin: 9.4 g/dL — ABNORMAL LOW (ref 12.0–15.0)
Immature Granulocytes: 2 %
Lymphocytes Relative: 23 %
Lymphs Abs: 3.5 10*3/uL (ref 0.7–4.0)
MCH: 28 pg (ref 26.0–34.0)
MCHC: 26.7 g/dL — ABNORMAL LOW (ref 30.0–36.0)
MCV: 104.8 fL — ABNORMAL HIGH (ref 80.0–100.0)
Monocytes Absolute: 1.4 10*3/uL — ABNORMAL HIGH (ref 0.1–1.0)
Monocytes Relative: 9 %
Neutro Abs: 9.8 10*3/uL — ABNORMAL HIGH (ref 1.7–7.7)
Neutrophils Relative %: 65 %
Platelets: 209 10*3/uL (ref 150–400)
RBC: 3.36 MIL/uL — ABNORMAL LOW (ref 3.87–5.11)
RDW: 16.5 % — ABNORMAL HIGH (ref 11.5–15.5)
WBC: 15.2 10*3/uL — ABNORMAL HIGH (ref 4.0–10.5)
nRBC: 0.1 % (ref 0.0–0.2)

## 2021-07-23 LAB — I-STAT ARTERIAL BLOOD GAS, ED
Acid-Base Excess: 15 mmol/L — ABNORMAL HIGH (ref 0.0–2.0)
Bicarbonate: 43.1 mmol/L — ABNORMAL HIGH (ref 20.0–28.0)
Calcium, Ion: 1.22 mmol/L (ref 1.15–1.40)
HCT: 21 % — ABNORMAL LOW (ref 36.0–46.0)
Hemoglobin: 7.1 g/dL — ABNORMAL LOW (ref 12.0–15.0)
O2 Saturation: 100 %
Patient temperature: 86.8
Potassium: 3.5 mmol/L (ref 3.5–5.1)
Sodium: 146 mmol/L — ABNORMAL HIGH (ref 135–145)
TCO2: 46 mmol/L — ABNORMAL HIGH (ref 22–32)
pCO2 arterial: 61.9 mmHg — ABNORMAL HIGH (ref 32.0–48.0)
pH, Arterial: 7.419 (ref 7.350–7.450)
pO2, Arterial: 355 mmHg — ABNORMAL HIGH (ref 83.0–108.0)

## 2021-07-23 LAB — RESP PANEL BY RT-PCR (FLU A&B, COVID) ARPGX2
Influenza A by PCR: NEGATIVE
Influenza B by PCR: NEGATIVE
SARS Coronavirus 2 by RT PCR: NEGATIVE

## 2021-07-23 LAB — CBG MONITORING, ED: Glucose-Capillary: 184 mg/dL — ABNORMAL HIGH (ref 70–99)

## 2021-07-23 LAB — BRAIN NATRIURETIC PEPTIDE: B Natriuretic Peptide: 147.2 pg/mL — ABNORMAL HIGH (ref 0.0–100.0)

## 2021-07-23 LAB — PROTIME-INR
INR: 0.9 (ref 0.8–1.2)
Prothrombin Time: 12.4 seconds (ref 11.4–15.2)

## 2021-07-23 LAB — LACTIC ACID, PLASMA: Lactic Acid, Venous: 0.6 mmol/L (ref 0.5–1.9)

## 2021-07-23 LAB — TROPONIN I (HIGH SENSITIVITY): Troponin I (High Sensitivity): 9 ng/L (ref <18)

## 2021-07-23 IMAGING — CT CT HEAD W/O CM
4 series · 16 of 47 positions shown, 18 images · non-contrast
Comparison: MRI [DATE], CT brain [DATE]

CLINICAL DATA: Mental status change

EXAM:
CT HEAD WITHOUT CONTRAST
TECHNIQUE: Contiguous axial images were obtained from the base of the skull
through the vertex without intravenous contrast.

[Series 3: head wo · axial · 0.47mm/px · z∈[+912,+1046]mm · 7 of 37 slices shown, 9 images]
[im 5/37  brain]
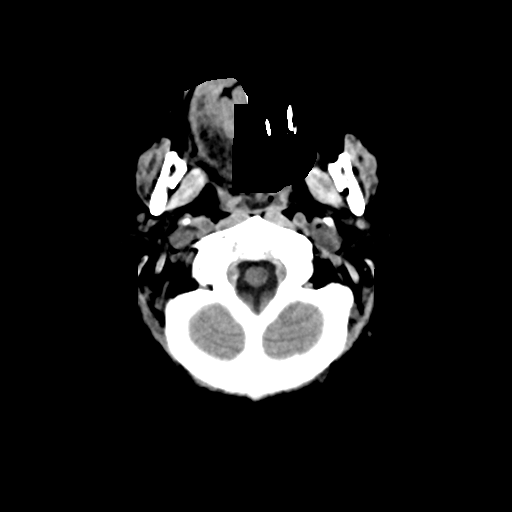
[im 5/37  bone]
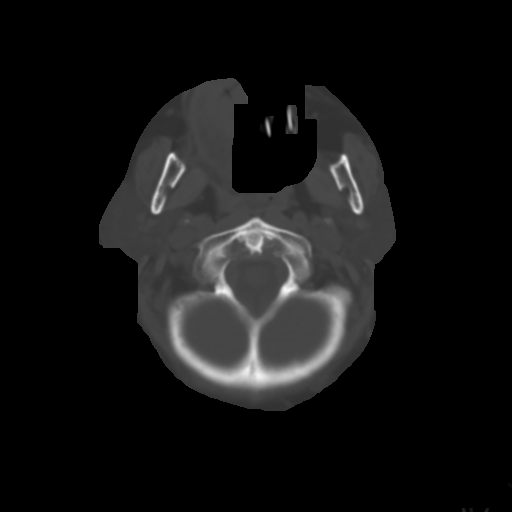
[im 10/37  brain]
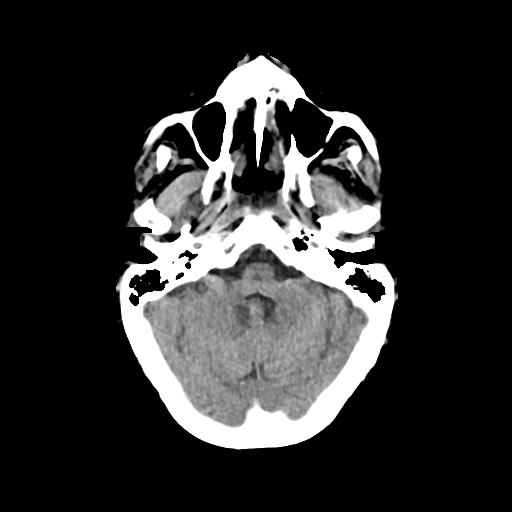
[im 14/37  brain]
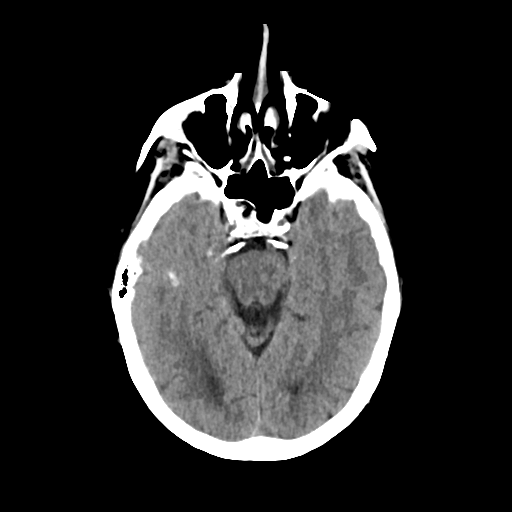
[im 19/37  brain]
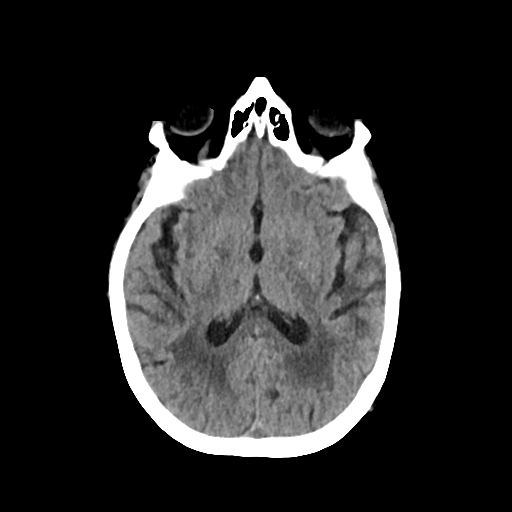
[im 23/37  brain]
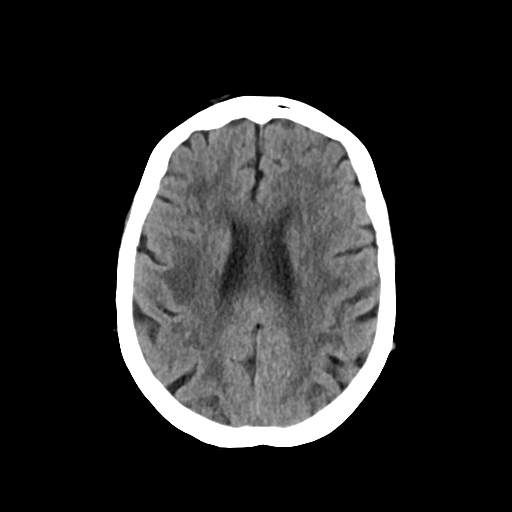
[im 23/37  bone]
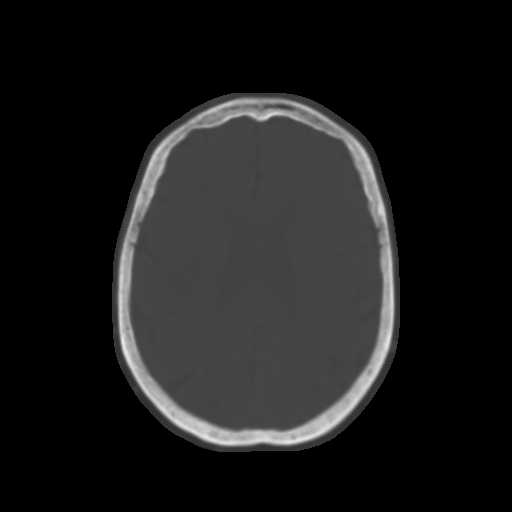
[im 28/37  brain]
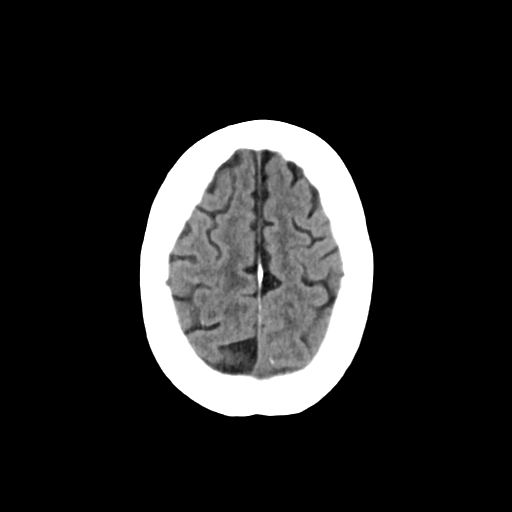
[im 32/37  brain]
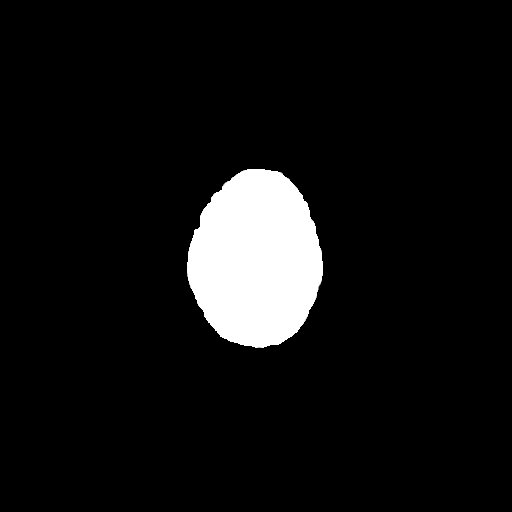

[Series 4: head bone · axial · 0.47mm/px · z∈[+910,+946]mm · 3 of 91 slices shown]
[im 10/91  bone]
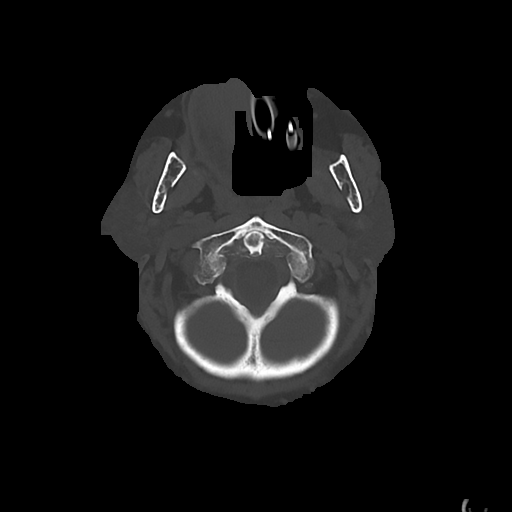
[im 19/91  bone]
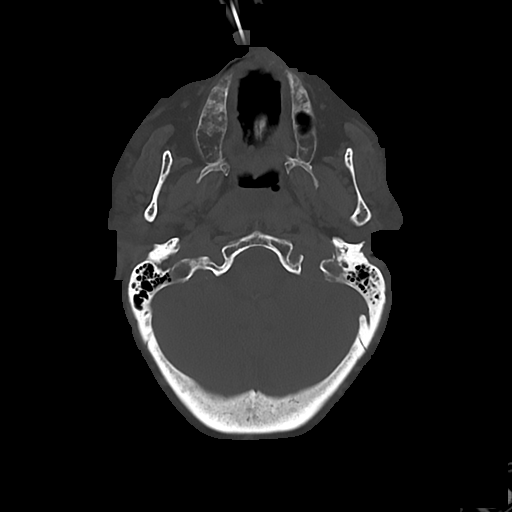
[im 28/91  bone]
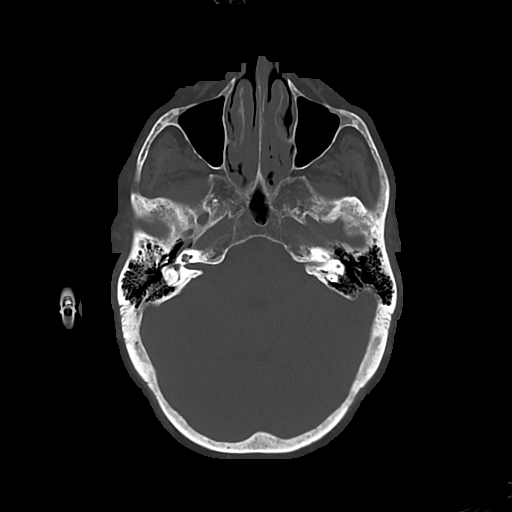

[Series 5: cor soft · coronal · 0.34mm/px · 3 of 85 slices shown]
[im 30/85  brain]
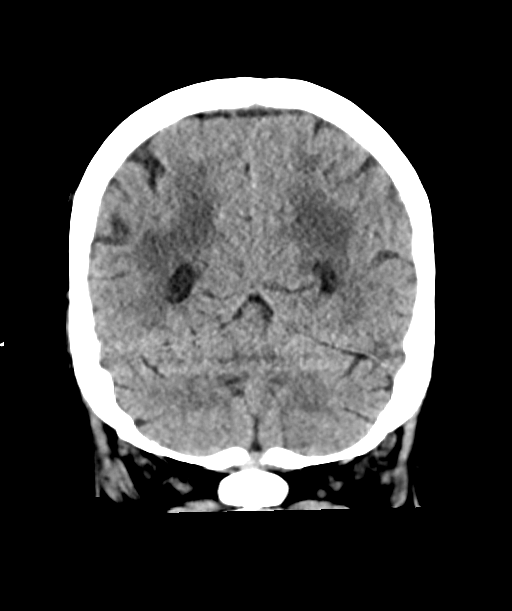
[im 38/85  brain]
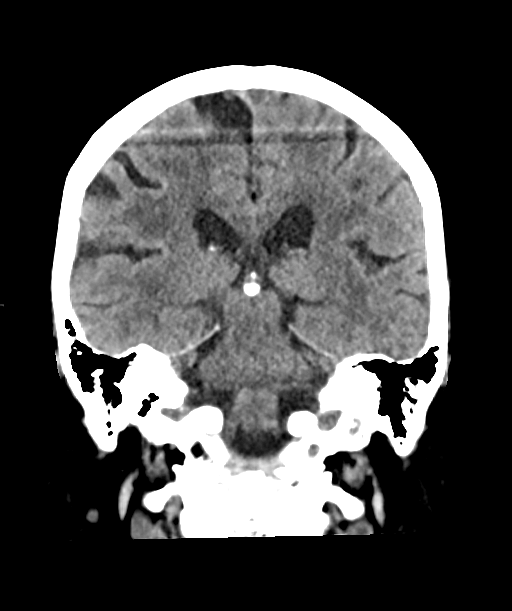
[im 47/85  brain]
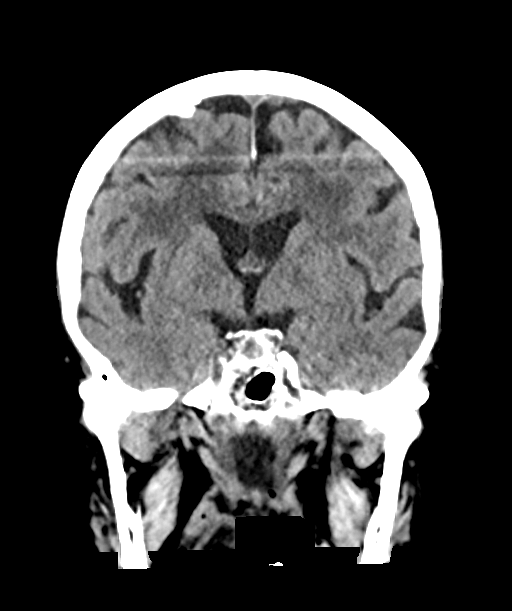

[Series 6: sag soft · sagittal · 0.41mm/px · 3 of 59 slices shown]
[im 20/59  brain]
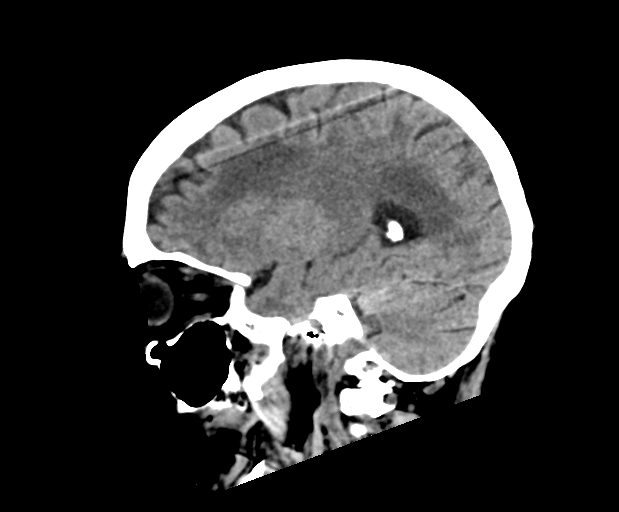
[im 30/59  brain]
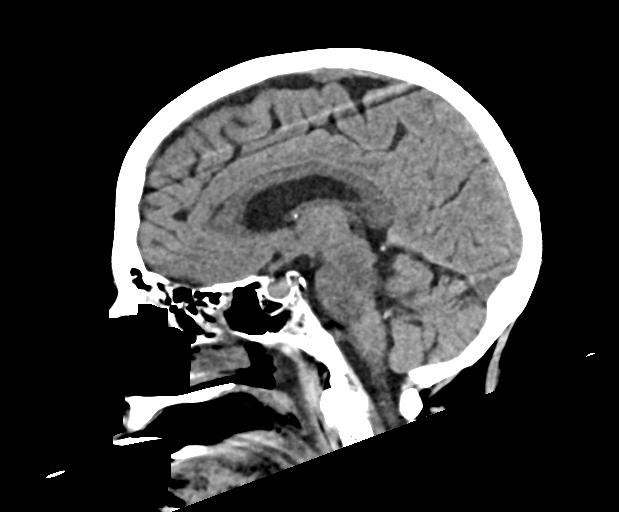
[im 39/59  brain]
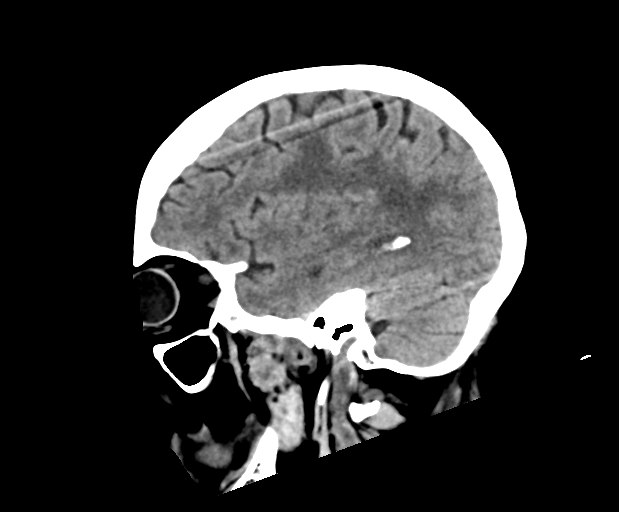

[16 of 47 positions shown; findings below may reference images not displayed]

FINDINGS: Brain: No acute territorial infarction, hemorrhage or intracranial
mass. Mild atrophy. Extensive white matter hypodensity consistent
with chronic small vessel ischemic change. Stable ventricle size

Vascular: No hyperdense vessels.  Carotid vascular calcification

Skull: Normal. Negative for fracture or focal lesion.

Sinuses/Orbits: Patchy mucosal thickening in the sinuses

Other: None
IMPRESSION: 1. No CT evidence for acute intracranial abnormality.
2. Mild atrophy. Extensive chronic small vessel ischemic changes of
the white matter

## 2021-07-23 IMAGING — DX DG CHEST 1V PORT
1 series · 2 of 2 positions shown · non-contrast
Comparison: Chest x-ray [DATE], CT angio chest [DATE]

CLINICAL DATA: Altered mental status.  Intubation.

EXAM:
PORTABLE CHEST 1 VIEW

[Series 1: chest · 0.14mm/px · 2 of 2 slices shown]
[im 1/2]
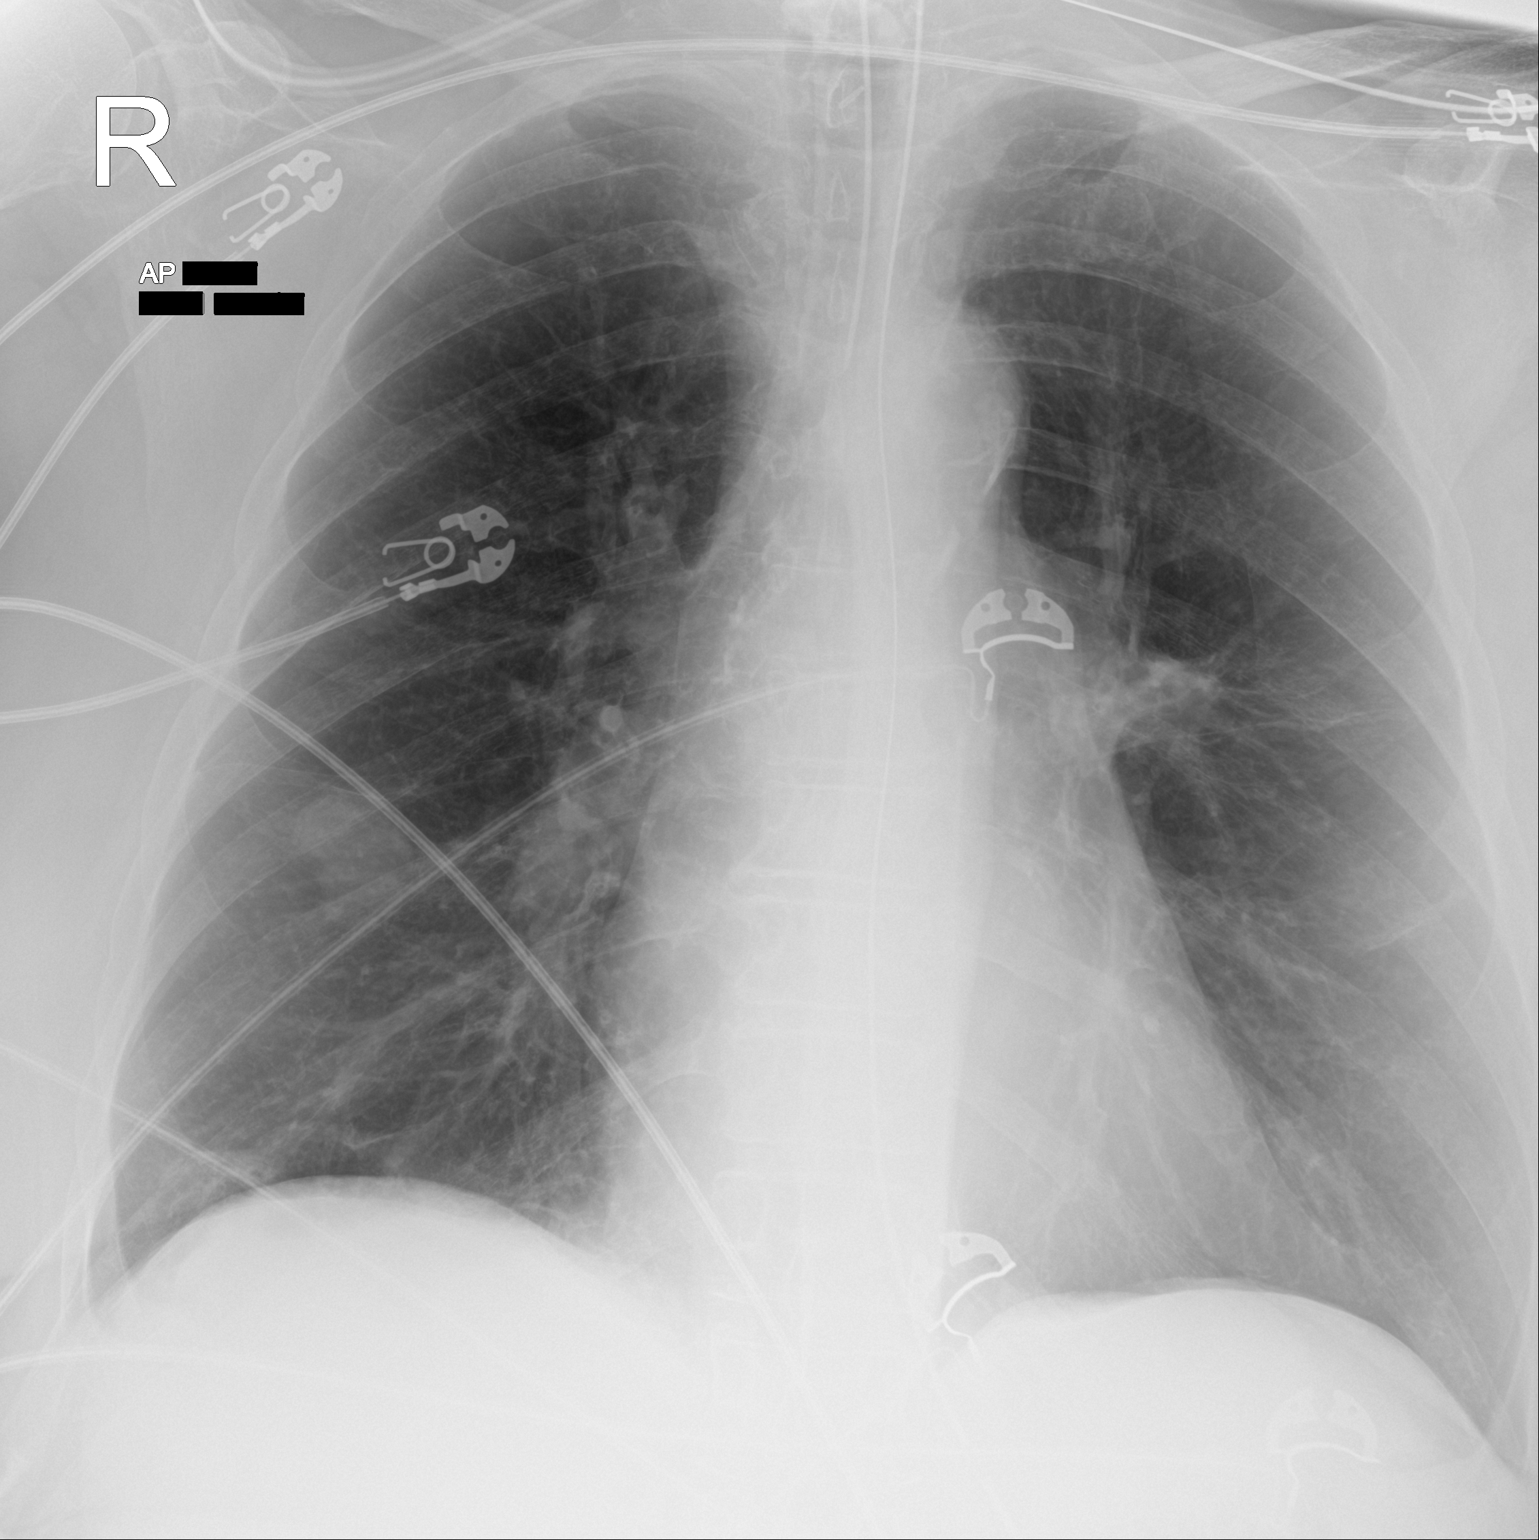
[im 2/2]
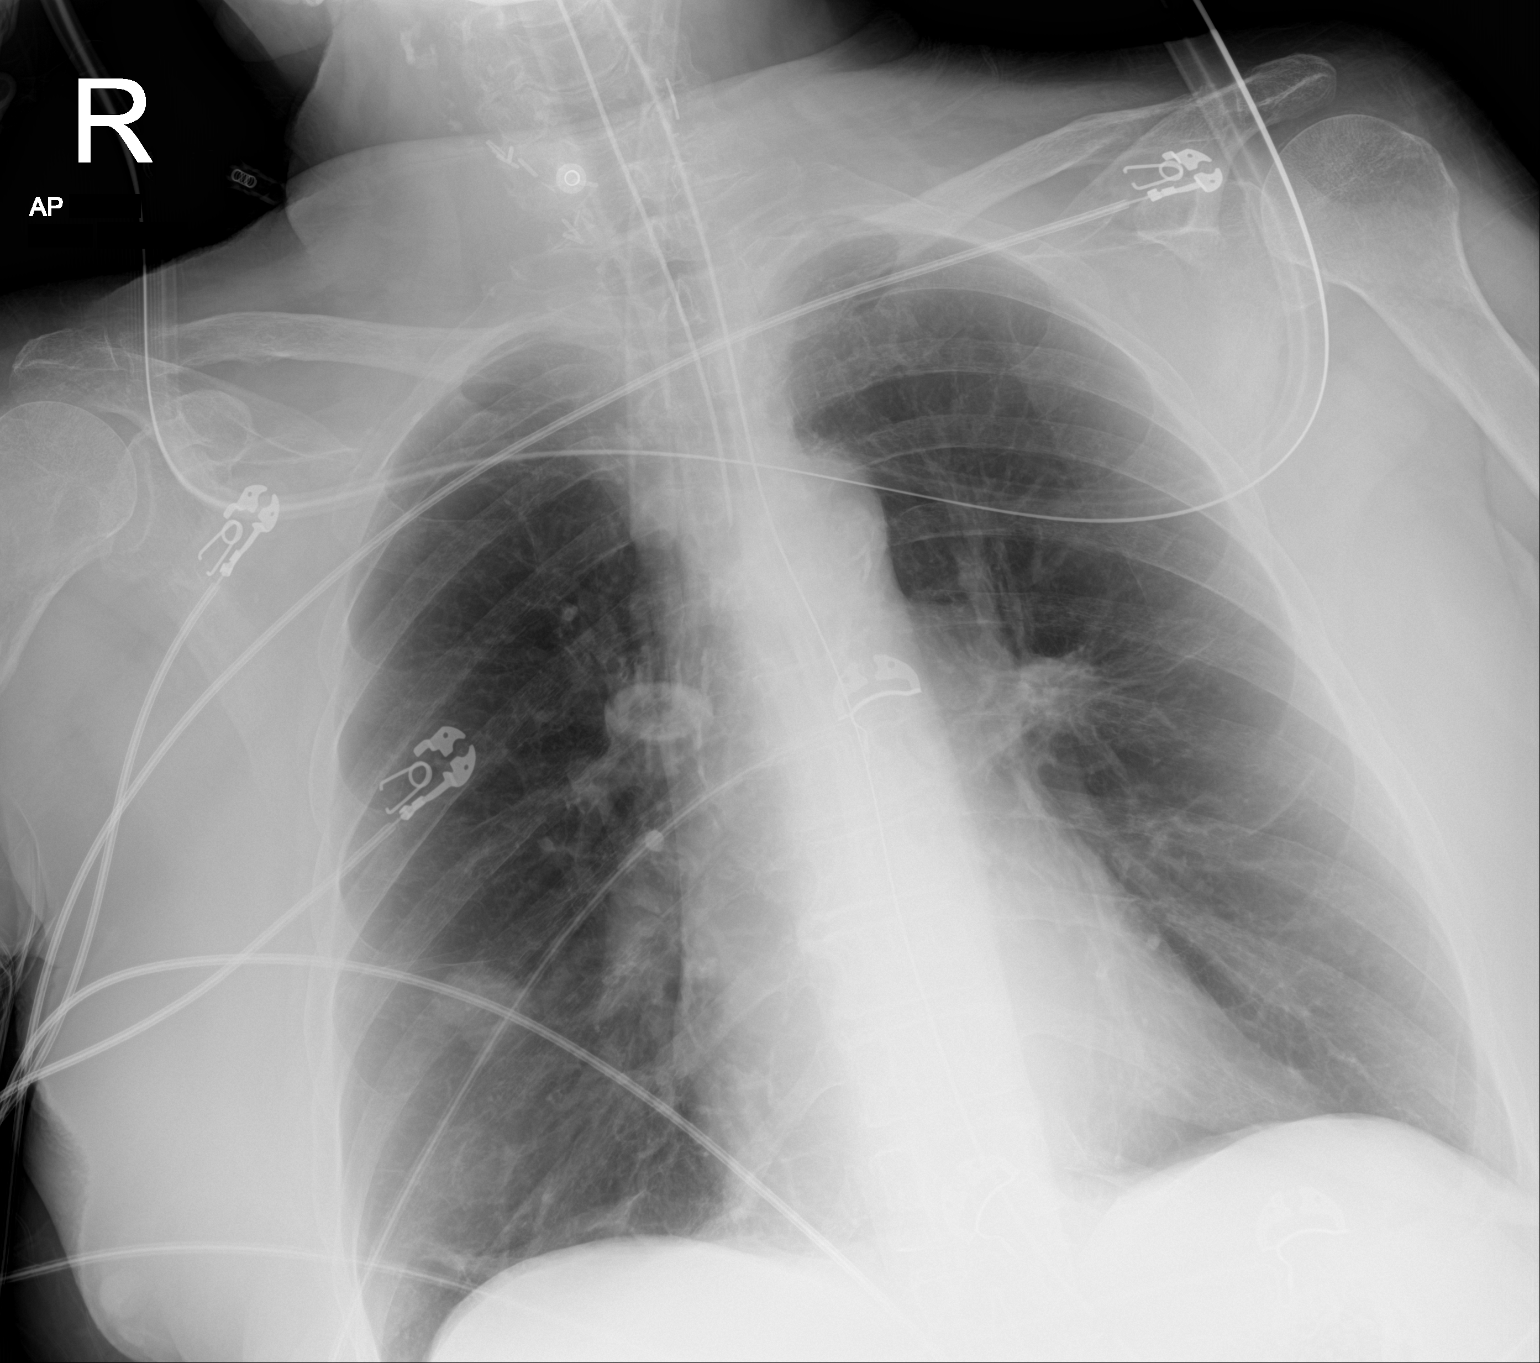

[2 of 2 positions shown; findings below may reference images not displayed]

FINDINGS: Interval placement endotracheal tube with tip approximately 3 cm
above the carina. Interval placement of an enteric tube coursing
below the hemidiaphragm with tip and side port collimated off view.

The heart and mediastinal contours are unchanged. Atherosclerotic
plaque. Hilar vasculature prominence.

Slight hyperinflation of the lungs. Interval increase in size of a
right lung 2.5 cm density. No pulmonary edema. No pleural effusion.
No pneumothorax.

No acute osseous abnormality.

Vascular clips overlie the neck.
IMPRESSION: 1. Endotracheal tube with tip approximately 3 cm above the carina.
2. Enteric tube coursing below the hemidiaphragm with tip and side
port collimated off view.
3. Interval increase in size of a right lung 2.5 cm density.
Recommend CT chest for further evaluation.

## 2021-07-23 MED ORDER — PROPOFOL 1000 MG/100ML IV EMUL
INTRAVENOUS | Status: AC
  Administered 2021-07-23: 10 ug/kg/min
  Filled 2021-07-23: qty 100

## 2021-07-23 MED ORDER — POLYETHYLENE GLYCOL 3350 17 G PO PACK
17.0000 g | PACK | Freq: Every day | ORAL | Status: DC
  Administered 2021-07-24: 17 g
  Filled 2021-07-23: qty 1

## 2021-07-23 MED ORDER — FENTANYL BOLUS VIA INFUSION
25.0000 ug | INTRAVENOUS | Status: DC | PRN
Start: 2021-07-23 — End: 2021-07-26
  Administered 2021-07-24 – 2021-07-26 (×10): 25 ug via INTRAVENOUS
  Filled 2021-07-23: qty 25

## 2021-07-23 MED ORDER — ROCURONIUM BROMIDE 50 MG/5ML IV SOLN
INTRAVENOUS | Status: AC | PRN
  Administered 2021-07-23: 100 mg via INTRAVENOUS

## 2021-07-23 MED ORDER — DOCUSATE SODIUM 50 MG/5ML PO LIQD
100.0000 mg | Freq: Two times a day (BID) | ORAL | Status: DC
  Administered 2021-07-24: 100 mg
  Filled 2021-07-23: qty 10

## 2021-07-23 MED ORDER — MIDAZOLAM HCL 2 MG/2ML IJ SOLN
1.0000 mg | INTRAMUSCULAR | Status: AC | PRN
  Administered 2021-07-24 (×3): 1 mg via INTRAVENOUS
  Filled 2021-07-23 (×2): qty 2

## 2021-07-23 MED ORDER — IPRATROPIUM-ALBUTEROL 0.5-2.5 (3) MG/3ML IN SOLN
3.0000 mL | Freq: Once | RESPIRATORY_TRACT | Status: AC
  Administered 2021-07-23: 3 mL via RESPIRATORY_TRACT
  Filled 2021-07-23: qty 3

## 2021-07-23 MED ORDER — DOCUSATE SODIUM 50 MG/5ML PO LIQD: 100.0000 mg | Freq: Two times a day (BID) | ORAL | Status: DC | PRN

## 2021-07-23 MED ORDER — ETOMIDATE 2 MG/ML IV SOLN
INTRAVENOUS | Status: AC | PRN
  Administered 2021-07-23: 20 mg via INTRAVENOUS

## 2021-07-23 MED ORDER — SODIUM CHLORIDE 0.9 % IV BOLUS: 1000.0000 mL | Freq: Once | INTRAVENOUS | Status: DC

## 2021-07-23 MED ORDER — POLYETHYLENE GLYCOL 3350 17 G PO PACK: 17.0000 g | PACK | Freq: Every day | ORAL | Status: DC | PRN

## 2021-07-23 MED ORDER — IPRATROPIUM-ALBUTEROL 0.5-2.5 (3) MG/3ML IN SOLN
3.0000 mL | Freq: Four times a day (QID) | RESPIRATORY_TRACT | Status: DC
  Administered 2021-07-24 – 2021-07-27 (×16): 3 mL via RESPIRATORY_TRACT
  Filled 2021-07-23 (×16): qty 3

## 2021-07-23 MED ORDER — FENTANYL 2500MCG IN NS 250ML (10MCG/ML) PREMIX INFUSION
25.0000 ug/h | INTRAVENOUS | Status: DC
  Administered 2021-07-23: 25 ug/h via INTRAVENOUS
  Administered 2021-07-24: 150 ug/h via INTRAVENOUS
  Administered 2021-07-25 – 2021-07-26 (×3): 200 ug/h via INTRAVENOUS
  Filled 2021-07-23 (×5): qty 250

## 2021-07-23 MED ORDER — LEVOTHYROXINE SODIUM 137 MCG PO TABS
137.0000 ug | ORAL_TABLET | Freq: Every day | ORAL | Status: DC
  Administered 2021-07-24 – 2021-07-26 (×3): 137 ug
  Filled 2021-07-23 (×3): qty 1

## 2021-07-23 MED ORDER — NALOXONE HCL 2 MG/2ML IJ SOSY
PREFILLED_SYRINGE | INTRAMUSCULAR | Status: AC
  Filled 2021-07-23: qty 2

## 2021-07-23 MED ORDER — DEXMEDETOMIDINE HCL IN NACL 400 MCG/100ML IV SOLN
0.0000 ug/kg/h | INTRAVENOUS | Status: DC
  Administered 2021-07-24: 0.4 ug/kg/h via INTRAVENOUS
  Filled 2021-07-23: qty 100

## 2021-07-23 MED ORDER — ATORVASTATIN CALCIUM 10 MG PO TABS
20.0000 mg | ORAL_TABLET | Freq: Every day | ORAL | Status: DC
  Administered 2021-07-24 – 2021-07-26 (×3): 20 mg via NASOGASTRIC
  Filled 2021-07-23 (×3): qty 2

## 2021-07-23 MED ORDER — MIDAZOLAM HCL 2 MG/2ML IJ SOLN
1.0000 mg | INTRAMUSCULAR | Status: DC | PRN
  Administered 2021-07-24 (×2): 1 mg via INTRAVENOUS
  Filled 2021-07-23 (×3): qty 2

## 2021-07-23 MED ORDER — INSULIN ASPART 100 UNIT/ML IJ SOLN: 0.0000 [IU] | Freq: Three times a day (TID) | INTRAMUSCULAR | Status: DC

## 2021-07-23 MED ORDER — PANTOPRAZOLE SODIUM 40 MG IV SOLR
40.0000 mg | Freq: Every day | INTRAVENOUS | Status: DC
  Administered 2021-07-23 – 2021-07-24 (×2): 40 mg via INTRAVENOUS
  Filled 2021-07-23 (×2): qty 40

## 2021-07-23 MED ORDER — FENTANYL CITRATE PF 50 MCG/ML IJ SOSY
50.0000 ug | PREFILLED_SYRINGE | Freq: Once | INTRAMUSCULAR | Status: DC
Start: 2021-07-23 — End: 2021-07-24

## 2021-07-23 MED ORDER — SODIUM CHLORIDE 0.9 % IV SOLN
INTRAVENOUS | Status: AC | PRN
  Administered 2021-07-23: 1000 mL via INTRAVENOUS

## 2021-07-23 NOTE — ED Notes (Signed)
XR at bedside

## 2021-07-23 NOTE — ED Notes (Signed)
Pt transported to CT with RN NT and RT

## 2021-07-23 NOTE — ED Provider Notes (Signed)
[facility] EMERGENCY DEPARTMENT Provider Note   CSN: [id number] Arrival date & time: 07/23/21  2015     History Chief Complaint  Patient presents with   unresponsive    Holly [name] is a 75 y.o. female.  HPI This is a 75 year old female with history of COPD on home oxygen, hypertension, reported lung cancer on palliative radiation, and prior GI bleed who presents after being found unresponsive.  Last known normal by family was at 63 PM last night when patient went to sleep in her bed.  Family found her today just prior to arrival and patient was in her recliner and was unresponsive.  EMS reports that patient was hypoxic on their arrival and was placed on 6 L by nasal cannula but otherwise vitals were stable and patient had palpable pulses.  No other interventions given in route.    Past Medical History:  Diagnosis Date   Anxiety    Asthma    Basal cell carcinoma    lung cancer.  Skin cancer- basal - nose removed   Bruises easily    Cataract    Cervicalgia    COPD (chronic obstructive pulmonary disease) (HCC)    emphysema and COPD   Dyspnea    uses O2 only when needed. Uses 2 L    Foot swelling    left- has shown to Drs.   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   Hypercalcemia    Hypertension    Hypothyroidism    Mixed hyperlipidemia    Pneumonia    Pulmonary nodule    Sleep apnea    does not wear cpap   Thyroid neoplasm    Urinary incontinence    Wears dentures    Wears glasses     Patient Active Problem List   Diagnosis Date Noted   Hypotension 02/23/2021   Hypokalemia 02/23/2021   Acute renal failure superimposed on stage 3a chronic kidney disease ([place]) 02/22/2021   GI bleed 02/16/2021   ABLA (acute blood loss anemia) 02/16/2021   Diverticulitis 02/16/2021   Current chronic use of systemic steroids 02/16/2021   Pressure injury of skin 02/16/2021   Olecranon bursitis of right elbow 10/02/2020    Ulna, olecranon process fracture, right, closed, with routine healing, subsequent encounter 10/01/2020   Primary malignant neoplasm of left upper lobe of lung ([place]) 07/09/2019   Closed fracture of right olecranon process 12/04/2017   Cough 11/11/2015   Chronic respiratory failure with hypoxia ([place]) 11/11/2014   Cigarette smoker 10/16/2014   Multinodular thyroid 07/30/2014   Neoplasm of uncertain behavior of thyroid gland, 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   COPD exacerbation ([place]) 02/11/2013   Pedal edema 02/11/2013   COPD GOLD III  06/11/2010   CARCINOMA, BASAL CELL, 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   HYPERLIPIDEMIA, MIXED 05/13/2010   HYPERCALCEMIA 05/13/2010   ANXIETY 05/13/2010   Essential hypertension 05/13/2010   CERVICALGIA 05/13/2010   OSTEOPENIA 05/13/2010   URINARY INCONTINENCE 05/13/2010   CARCINOMA, BASAL CELL, 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    Past Surgical History:  Procedure Laterality Date   BIOPSY  02/17/2021   Procedure: BIOPSY;  Surgeon: Otis Brace, MD;  Location: [facility];  Service: Gastroenterology;;   COLONOSCOPY     DENTAL SURGERY     ELBOW ARTHROPLASTY Right 2020   ESOPHAGOGASTRODUODENOSCOPY (EGD) WITH PROPOFOL N/A 02/17/2021   Procedure: ESOPHAGOGASTRODUODENOSCOPY (EGD) WITH PROPOFOL;  Surgeon: Otis Brace, MD;  Location: [facility];  Service:  Gastroenterology;  Laterality: N/A;   EYE SURGERY Right    cataract surgery   FOOT SURGERY  30 YRS AGO   BILATERAL   HARDWARE REMOVAL Right 10/02/2020   Procedure: HARDWARE REMOVAL RIGHT ELBOW;  Surgeon: Shona Needles, MD;  Location: [facility];  Service: Orthopedics;  Laterality: Right;   I & D EXTREMITY Right 01/13/2021   Procedure: IRRIGATION AND DEBRIDEMENT RIGHT ELBOW;  Surgeon: Shona Needles, MD;  Location: [place];  Service: Orthopedics;  Laterality: Right;   ORIF ELBOW FRACTURE Right 12/06/2017   Procedure: OPEN REDUCTION INTERNAL FIXATION (ORIF) ELBOW/OLECRANON FRACTURE;  Surgeon: Shona Needles, MD;  Location: [place];  Service:  Orthopedics;  Laterality: Right;   SKIN CANCER EXCISION     THYROIDECTOMY N/A 07/31/2014   Procedure: TOTAL THYROIDECTOMY;  Surgeon: Armandina Gemma, MD;  Location: [facility];  Service: General;  Laterality: N/A;   VAGINAL HYSTERECTOMY     VIDEO BRONCHOSCOPY WITH ENDOBRONCHIAL NAVIGATION Bilateral 06/25/2019   Procedure: VIDEO BRONCHOSCOPY WITH ENDOBRONCHIAL NAVIGATION;  Surgeon: Lajuana Matte, MD;  Location: [facility];  Service: Thoracic;  Laterality: Bilateral;     OB History   No obstetric history on file.     Family History  Problem Relation Age of Onset   Coronary artery disease Father    Hypertension Father    Asthma Father    Allergies Father    Hypertension Mother    Lung cancer Mother     Social History   Tobacco Use   Smoking status: Former    Packs/day: 1.00    Years: 30.00    Pack years: 30.00    Types: Cigarettes    Quit date: 02/24/2015    Years since quitting: 6.4   Smokeless tobacco: Never  Vaping Use   Vaping Use: Former  Substance Use Topics   Alcohol use: Yes    Alcohol/week: 0.0 standard drinks    Comment: very seldom   Drug use: Never    Home Medications Prior to Admission medications   Medication Sig Start Date End Date Taking? Authorizing Provider  ALPRAZolam Duanne Moron) 0.5 MG tablet Take 0.5 mg by mouth 2 (two) times daily as needed for anxiety. 09/14/20   [provider]  aspirin EC 81 MG tablet Take 81 mg by mouth daily. Swallow whole.    [provider]  atorvastatin (LIPITOR) 20 MG tablet Take 20 mg by mouth at bedtime.     [provider]  busPIRone (BUSPAR) 30 MG tablet Take 30 mg by mouth 2 (two) times daily.    [provider]  cholecalciferol (VITAMIN D3) 25 MCG (1000 UNIT) tablet Take 2,000 Units by mouth daily.    [provider]  donepezil (ARICEPT) 10 MG tablet Take 10 mg by mouth at bedtime. 12/17/20   [provider]  Fluticasone-Umeclidin-Vilant 100-62.5-25 MCG/INH AEPB Inhale 1 puff  into the lungs daily. Trelegy    [provider]  furosemide (LASIX) 20 MG tablet Take 1 tablet (20 mg total) by mouth daily. 03/23/21   Petrucelli, Samantha R, PA-C  ipratropium-albuterol (DUONEB) 0.5-2.5 (3) MG/3ML SOLN Inhale 3 mLs into the lungs every 4 (four) hours as needed (respiratory issues.). 01/29/20   [provider]  levothyroxine (SYNTHROID) 137 MCG tablet Take 137 mcg by mouth daily before breakfast. 09/22/20   [provider]  metoprolol succinate (TOPROL-XL) 50 MG 24 hr tablet Take 50 mg by mouth daily. Take with or immediately following a meal.    [provider]  Multiple Vitamins-Minerals (MULTI ADULT GUMMIES PO) Take 2 each by mouth daily.    [provider]  pantoprazole (PROTONIX) 40 MG tablet Take 1 tablet (40 mg total) by mouth 2 (two) times daily. 02/18/21 04/20/21  Dessa Phi, DO  potassium chloride (KLOR-CON) 10 MEQ tablet Take 10 mEq by mouth 2 (two) times daily. 03/08/21   [provider]  predniSONE (DELTASONE) 10 MG tablet Take 20 mg by mouth daily with breakfast.    [provider]  traMADol (ULTRAM) 50 MG tablet Take 50 mg by mouth 3 (three) times daily as needed for moderate pain. 03/16/21   [provider]  VENTOLIN HFA 108 (90 Base) MCG/ACT inhaler INHALE 2 PUFFS INTO THE LUNGS EVERY 4 HOURS AS NEEDED FOR WHEEZING ORSHORTNESS OF BREATH Patient taking differently: Inhale 2 puffs into the lungs every 4 (four) hours as needed for wheezing or shortness of breath. 06/29/17   Parrett, Fonnie Mu, NP    Allergies    Amlodipine besylate, Codeine, and Oxycodone-acetaminophen  Review of Systems   Review of Systems  Unable to perform ROS: Intubated   Physical Exam Updated Vital Signs BP (!) 186/80   Pulse 61   Temp (!) 97.5 F (36.4 C) (Temporal)   Resp 18   Ht 5\' 4"  (1.626 m)   Wt 74.8 kg   SpO2 100%   BMI 28.32 kg/m   Physical Exam Vitals and nursing note reviewed.  Constitutional:       Appearance: She is well-developed.     Comments: Obtunded   HENT:     Head: Normocephalic and atraumatic.  Eyes:     Conjunctiva/sclera: Conjunctivae normal.     Comments: Pupils 2 mm and reactive   Cardiovascular:     Rate and Rhythm: Normal rate and regular rhythm.     Heart sounds: No murmur heard. Pulmonary:     Comments: Poor air movement. No tachypnea or increased WOB.  Abdominal:     General: Abdomen is flat.     Palpations: Abdomen is soft.  Musculoskeletal:     Cervical back: Neck supple.     Comments: No bruising or deformity to extremities   Skin:    General: Skin is warm and dry.  Neurological:     GCS: GCS eye subscore is 1. GCS verbal subscore is 1. GCS motor subscore is 1.    ED Results / Procedures / Treatments   Labs (all labs ordered are listed, but only abnormal results are displayed) Labs Reviewed  COMPREHENSIVE METABOLIC PANEL - Abnormal; Notable for the following components:      Result Value   Chloride 90 (*)    CO2 >45 (*)    Glucose, Bld 171 (*)    Albumin 2.7 (*)    AST 12 (*)    All other components within normal limits  CBC WITH DIFFERENTIAL/PLATELET - Abnormal; Notable for the following components:   WBC 15.2 (*)    RBC 3.36 (*)    Hemoglobin 9.4 (*)    HCT 35.2 (*)    MCV 104.8 (*)    MCHC 26.7 (*)    RDW 16.5 (*)    Neutro Abs 9.8 (*)    Monocytes Absolute 1.4 (*)    Abs Immature Granulocytes 0.26 (*)    All other components within normal limits  CBG MONITORING, ED - Abnormal; Notable for the following components:   Glucose-Capillary 184 (*)    All other components within normal limits  I-STAT ARTERIAL BLOOD GAS,  ED - Abnormal; Notable for the following components:   pCO2 arterial 61.9 (*)    pO2, Arterial 355 (*)    Bicarbonate 43.1 (*)    TCO2 46 (*)    Acid-Base Excess 15.0 (*)    Sodium 146 (*)    HCT 21.0 (*)    Hemoglobin 7.1 (*)    All other components within normal limits  CULTURE, BLOOD (ROUTINE X 2)  CULTURE, BLOOD  (ROUTINE X 2)  RESP PANEL BY RT-PCR (FLU A&B, COVID) ARPGX2  LACTIC ACID, PLASMA  PROTIME-INR  LACTIC ACID, PLASMA  URINALYSIS, ROUTINE W REFLEX MICROSCOPIC  BRAIN NATRIURETIC PEPTIDE  I-STAT CHEM 8, ED  I-STAT VENOUS BLOOD GAS, ED  TROPONIN I (HIGH SENSITIVITY)    EKG EKG Interpretation  Date/Time:  Friday July 23 2021 20:21:31 EDT Ventricular Rate:  75 PR Interval:  136 QRS Duration: 98 QT Interval:  393 QTC Calculation: 439 R Axis:   42 Text Interpretation: Sinus rhythm ST elevation, consider inferior injury Baseline wander in lead(s) V5 Confirmed by Lennice Sites ([id number]) on 07/23/2021 8:50:01 PM  Radiology CT HEAD WO CONTRAST (5MM)  Result Date: 07/23/2021 CLINICAL DATA:  Mental status change EXAM: CT HEAD WITHOUT CONTRAST TECHNIQUE: Contiguous axial images were obtained from the base of the skull through the vertex without intravenous contrast. COMPARISON:  MRI 10/13/2020, CT brain 09/29/2020 FINDINGS: Brain: No acute territorial infarction, hemorrhage or intracranial mass. Mild atrophy. Extensive white matter hypodensity consistent with chronic small vessel ischemic change. Stable ventricle size Vascular: No hyperdense vessels.  Carotid vascular calcification Skull: Normal. Negative for fracture or focal lesion. Sinuses/Orbits: Patchy mucosal thickening in the sinuses Other: None IMPRESSION: 1. No CT evidence for acute intracranial abnormality. 2. Mild atrophy. Extensive chronic small vessel ischemic changes of the white matter Electronically Signed   By: Donavan Foil M.D.   On: 07/23/2021 21:14   DG Chest Portable 1 View  Result Date: 07/23/2021 CLINICAL DATA:  Altered mental status.  Intubation. EXAM: PORTABLE CHEST 1 VIEW COMPARISON:  Chest x-ray 03/22/2020, CT angio chest 02/22/2021 FINDINGS: Interval placement endotracheal tube with tip approximately 3 cm above the carina. Interval placement of an enteric tube coursing below the hemidiaphragm with tip and side port  collimated off view. The heart and mediastinal contours are unchanged. Atherosclerotic plaque. Hilar vasculature prominence. Slight hyperinflation of the lungs. Interval increase in size of a right lung 2.5 cm density. No pulmonary edema. No pleural effusion. No pneumothorax. No acute osseous abnormality. Vascular clips overlie the neck. IMPRESSION: 1. Endotracheal tube with tip approximately 3 cm above the carina. 2. Enteric tube coursing below the hemidiaphragm with tip and side port collimated off view. 3. Interval increase in size of a right lung 2.5 cm density. Recommend CT chest for further evaluation. Electronically Signed   By: Iven Finn M.D.   On: 07/23/2021 21:00    Procedures Procedure Name: Intubation Date/Time: 07/24/2021 12:34 AM Performed by: Coralee Pesa, MD Pre-anesthesia Checklist: Suction available, Timeout performed, Patient being monitored, Patient identified and Emergency Drugs available Oxygen Delivery Method: Nasal cannula Preoxygenation: Pre-oxygenation with 100% oxygen Induction Type: Rapid sequence Laryngoscope Size: Glidescope and 3 Grade View: Grade I Tube size: 7.5 mm Number of attempts: 1 Airway Equipment and Method: Video-laryngoscopy Placement Confirmation: ETT inserted through vocal cords under direct vision, Positive ETCO2, Breath sounds checked- equal and bilateral and CO2 detector Secured at: 22 cm Tube secured with: ETT holder      Medications Ordered in ED Medications  sodium chloride  0.9 % bolus 1,000 mL (0 mLs Intravenous Stopped 07/23/21 2108)  ipratropium-albuterol (DUONEB) 0.5-2.5 (3) MG/3ML nebulizer solution 3 mL (has no administration in time range)  fentaNYL (SUBLIMAZE) injection 50 mcg (0 mcg Intravenous Hold 07/23/21 2108)  fentaNYL 2573mcg in NS 271mL (58mcg/ml) infusion-PREMIX (25 mcg/hr Intravenous New Bag/Given 07/23/21 2113)  fentaNYL (SUBLIMAZE) bolus via infusion 25 mcg (has no administration in time range)  midazolam  (VERSED) injection 1 mg (has no administration in time range)  midazolam (VERSED) injection 1 mg (has no administration in time range)  naloxone [facility]) 2 MG/2ML injection (  Given 07/23/21 2027)  0.9 %  sodium chloride infusion (1,000 mLs Intravenous New Bag/Given 07/23/21 2037)  propofol (DIPRIVAN) 1000 MG/100ML infusion (0 mcg/kg/min  Stopped 07/23/21 2046)  etomidate (AMIDATE) injection (20 mg Intravenous Given 07/23/21 2038)  rocuronium (ZEMURON) injection (100 mg Intravenous Given 07/23/21 2038)    ED Course  I have reviewed the triage vital signs and the nursing notes.  Pertinent labs & imaging results that were available during my care of the patient were reviewed by me and considered in my medical decision making (see chart for details).    MDM Rules/Calculators/A&P                      On arrival patient is obtunded with GCS of 3. No response to painful stimuli. Pupils are pinpoint but no hx opioid use and patient not bradypneic. Given 2 mg narcan with no response. No hypoxia on 6 L by nasal cannula. Has bilateral breath sounds but with poor air movement. Requires intubation for airway protection.  Intubated as above without complication using etomidate and rocuronium.  Initiated propofol infusion for sedation after intubation, patient experienced brief episode of hypotension with systolic blood pressure of 70.  Resolved after discontinuing propofol drip.  Given 2 L of fluids. Transition to fentanyl, however patient does not require any sedation based on her mental status.   Initial differential includes (but is not limited to): Infection/Sepsis, seizure, stroke, electrolyte derangement, encephalopathy, uremia, DKA, dehydration, hemorrhage, ingestion, overdose, withdrawal, intoxication, polypharmacy. Highest on ddx is hypercarbia given initial VBG with ph 7.1 and CO2 of >100 and patients hx of COPD.   EKG without acute ischemia and troponin negative, low suspicion for ACS. No evidence of  gross GI bleed and hgb stable at 9.4. Mild leukocytosis at 15 but no fever, no evidence of infection on exam. Blood cultures and lactate drawn. CT head without acute intracranial abnormalities.  Doubt opioid overdose given no response to Narcan.  CMP with elevated Bicarb as expected otherwise unremarkable no significant hyperkalemia, uremia, hypoglycemia. Remainder of labs still in process. Patient admitted to ICU for further management.      Final Clinical Impression(s) / ED Diagnoses Final diagnoses:  Acute respiratory failure with hypoxia and hypercapnia [facility])    Rx / DC Orders ED Discharge Orders     None        Coralee Pesa, MD 07/24/21 0036    Lennice Sites, DO 07/24/21 0040

## 2021-07-23 NOTE — H&P (Signed)
Holly [name] is an 75 y.o. female.   Chief Complaint: unresponsiveness HPI:  Holly [name]. Ekstrom is a 75 year old lady with PMH significant for COPD on home Oxygen and chronic steroid therapy, lung cancer diagnosed 5 years ago on intermittent palliative XRT, hypothyroidism among other conditions.  The patient was brought to the ED because of unresponsiveness.  The patient's daughter with whom she lives with, reported that her mother was alone on the day of admission because the companion that stays with her while the daughter is at work was not available.  The daughter called to check on her mother at lunchtime and her mother did not answer the phone.  Hence the daughter asked a friend to check in on her Mum.  The friend was able to assist the mother with her meal and into the lift recliner.  The daughter arrived home around 7pm and found her mother unresponsive so EMS was called.  Upon arrival to the ED, the patient had a GCS of 3 so she was intubated. CT head was negative but a venous blood gas found the patient to have a PCO2 of 95.6.  CXR did not show any acute pathology and her glucose was normal.  The patient was therefor referred for ICU admission.  I have seen and examined the patient.  She was hypertensive but unresponsive to noxious stimuli. She had anisocoria and appeared to be under the induction agents for intubation.  Past Medical History:  Diagnosis Date   Anxiety    Asthma    Basal cell carcinoma    lung cancer.  Skin cancer- basal - nose removed   Bruises easily    Cataract    Cervicalgia    COPD (chronic obstructive pulmonary disease) (HCC)    emphysema and COPD   Dyspnea    uses O2 only when needed. Uses 2 L    Foot swelling    left- has shown to Drs.   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   Hypercalcemia    Hypertension    Hypothyroidism    Mixed hyperlipidemia    Pneumonia    Pulmonary nodule    Sleep apnea    does  not wear cpap   Thyroid neoplasm    Urinary incontinence    Wears dentures    Wears glasses     Past Surgical History:  Procedure Laterality Date   BIOPSY  02/17/2021   Procedure: BIOPSY;  Surgeon: Otis Brace, MD;  Location: [facility];  Service: Gastroenterology;;   COLONOSCOPY     DENTAL SURGERY     ELBOW ARTHROPLASTY Right 2020   ESOPHAGOGASTRODUODENOSCOPY (EGD) WITH PROPOFOL N/A 02/17/2021   Procedure: ESOPHAGOGASTRODUODENOSCOPY (EGD) WITH PROPOFOL;  Surgeon: Otis Brace, MD;  Location: [facility];  Service: Gastroenterology;  Laterality: N/A;   EYE SURGERY Right    cataract surgery   FOOT SURGERY  30 YRS AGO   BILATERAL   HARDWARE REMOVAL Right 10/02/2020   Procedure: HARDWARE REMOVAL RIGHT ELBOW;  Surgeon: Shona Needles, MD;  Location: [facility];  Service: Orthopedics;  Laterality: Right;   I & D EXTREMITY Right 01/13/2021   Procedure: IRRIGATION AND DEBRIDEMENT RIGHT ELBOW;  Surgeon: Shona Needles, MD;  Location: [facility];  Service: Orthopedics;  Laterality: Right;   ORIF ELBOW FRACTURE Right 12/06/2017   Procedure: OPEN REDUCTION INTERNAL FIXATION (ORIF) ELBOW/OLECRANON FRACTURE;  Surgeon: Shona Needles, MD;  Location: [facility];  Service: Orthopedics;  Laterality: Right;   SKIN CANCER EXCISION     THYROIDECTOMY N/A 07/31/2014   Procedure: TOTAL THYROIDECTOMY;  Surgeon: Armandina Gemma, MD;  Location: [facility];  Service: General;  Laterality: N/A;   VAGINAL HYSTERECTOMY     VIDEO BRONCHOSCOPY WITH ENDOBRONCHIAL NAVIGATION Bilateral 06/25/2019   Procedure: VIDEO BRONCHOSCOPY WITH ENDOBRONCHIAL NAVIGATION;  Surgeon: Lajuana Matte, MD;  Location: [facility];  Service: Thoracic;  Laterality: Bilateral;    Family History  Problem Relation Age of Onset   Coronary artery disease Father    Hypertension Father    Asthma Father    Allergies Father    Hypertension Mother    Lung cancer Mother    Social History:  reports that she quit smoking about 6 years ago. Her smoking use  included cigarettes. She has a 30.00 pack-year smoking history. She has never used smokeless tobacco. She reports current alcohol use. She reports that she does not use drugs.  Allergies:  Allergies  Allergen Reactions   Amlodipine Besylate Other (See Comments)    Other reaction(s): edema Other reaction(s): edema   Codeine Nausea And Vomiting    Other reaction(s): vomiting   Oxycodone-Acetaminophen Nausea And Vomiting    (Not in a hospital admission)   Results for orders placed or performed during the hospital encounter of 07/23/21 (from the past 48 hour(s))  CBG monitoring, ED     Status: Abnormal   Collection Time: 07/23/21  8:16 PM  Result Value Ref Range   Glucose-Capillary 184 (H) 70 - 99 mg/dL    Comment: Glucose reference range applies only to samples taken after fasting for at least 8 hours.  Comprehensive metabolic panel     Status: Abnormal   Collection Time: 07/23/21  8:27 PM  Result Value Ref Range   Sodium 142 135 - 145 mmol/L   Potassium 4.5 3.5 - 5.1 mmol/L   Chloride 90 (L) 98 - 111 mmol/L   CO2 >45 (H) 22 - 32 mmol/L   Glucose, Bld 171 (H) 70 - 99 mg/dL    Comment: Glucose reference range applies only to samples taken after fasting for at least 8 hours.   BUN 18 8 - 23 mg/dL   Creatinine, Ser 0.87 0.44 - 1.00 mg/dL   Calcium 9.9 8.9 - 10.3 mg/dL   Total Protein 6.7 6.5 - 8.1 g/dL   Albumin 2.7 (L) 3.5 - 5.0 g/dL   AST 12 (L) 15 - 41 U/L   ALT 9 0 - 44 U/L   Alkaline Phosphatase 92 38 - 126 U/L   Total Bilirubin 0.6 0.3 - 1.2 mg/dL   GFR, Estimated >60 >60 mL/min    Comment: (NOTE) Calculated using the CKD-EPI Creatinine Equation (2021)    Anion gap NOT CALCULATED 5 - 15    Comment: Performed at [facility], [place] [address] [place], [place] [address]  Lactic acid, plasma     Status: None   Collection Time: 07/23/21  8:27 PM  Result Value Ref Range   Lactic Acid, Venous 0.6 0.5 - 1.9 mmol/L    Comment: Performed at [facility] [address] [place], [place] [address]  CBC with Differential     Status: Abnormal   Collection Time: 07/23/21  8:27 PM  Result Value Ref Range   WBC 15.2 (H) 4.0 - 10.5 K/uL   RBC 3.36 (L) 3.87 - 5.11 MIL/uL   Hemoglobin 9.4 (L) 12.0 - 15.0 g/dL  HCT 35.2 (L) 36.0 - 46.0 %   MCV 104.8 (H) 80.0 - 100.0 fL   MCH 28.0 26.0 - 34.0 pg   MCHC 26.7 (L) 30.0 - 36.0 g/dL   RDW 16.5 (H) 11.5 - 15.5 %   Platelets 209 150 - 400 K/uL   nRBC 0.1 0.0 - 0.2 %   Neutrophils Relative % 65 %   Neutro Abs 9.8 (H) 1.7 - 7.7 K/uL   Lymphocytes Relative 23 %   Lymphs Abs 3.5 0.7 - 4.0 K/uL   Monocytes Relative 9 %   Monocytes Absolute 1.4 (H) 0.1 - 1.0 K/uL   Eosinophils Relative 1 %   Eosinophils Absolute 0.1 0.0 - 0.5 K/uL   Basophils Relative 0 %   Basophils Absolute 0.0 0.0 - 0.1 K/uL   Immature Granulocytes 2 %   Abs Immature Granulocytes 0.26 (H) 0.00 - 0.07 K/uL    Comment: Performed at [facility] [address] [place], [place] [address]  Protime-INR     Status: None   Collection Time: 07/23/21  8:27 PM  Result Value Ref Range   Prothrombin Time 12.4 11.4 - 15.2 seconds   INR 0.9 0.8 - 1.2    Comment: (NOTE) INR goal varies based on device and disease states. Performed at [facility], [place] [address] [place], [place] [address]   Urinalysis, Routine w reflex microscopic Urine, Catheterized     Status: Abnormal   Collection Time: 07/23/21  8:31 PM  Result Value Ref Range   Color, Urine YELLOW YELLOW   APPearance HAZY (A) CLEAR   Specific Gravity, Urine 1.013 1.005 - 1.030   pH 6.0 5.0 - 8.0   Glucose, UA NEGATIVE NEGATIVE mg/dL   Hgb urine dipstick NEGATIVE NEGATIVE   Bilirubin Urine NEGATIVE NEGATIVE   Ketones, ur NEGATIVE NEGATIVE mg/dL   Protein, ur 30 (A) NEGATIVE mg/dL   Nitrite NEGATIVE NEGATIVE   Leukocytes,Ua NEGATIVE NEGATIVE   RBC / HPF 0-5 0 - 5 RBC/hpf   WBC, UA 0-5 0 - 5 WBC/hpf   Bacteria, UA RARE (A) NONE SEEN   Squamous Epithelial / LPF 0-5 0 - 5    Mucus PRESENT    Hyaline Casts, UA PRESENT     Comment: Performed at [facility], [place] [address] [place], [place] [address]  Resp Panel by RT-PCR (Flu A&B, Covid) Nasopharyngeal Swab     Status: None   Collection Time: 07/23/21  8:33 PM   Specimen: Nasopharyngeal Swab; Nasopharyngeal(NP) swabs in vial transport medium  Result Value Ref Range   SARS Coronavirus 2 by RT PCR NEGATIVE NEGATIVE    Comment: (NOTE) SARS-CoV-2 target nucleic acids are NOT DETECTED.  The SARS-CoV-2 RNA is generally detectable in upper respiratory specimens during the acute phase of infection. The lowest concentration of SARS-CoV-2 viral copies this assay can detect is 138 copies/mL. A negative result does not preclude SARS-Cov-2 infection and should not be used as the sole basis for treatment or other patient management decisions. A negative result may occur with  improper specimen collection/handling, submission of specimen other than nasopharyngeal swab, presence of viral mutation(s) within the areas targeted by this assay, and inadequate number of viral copies(<138 copies/mL). A negative result must be combined with clinical observations, patient history, and epidemiological information. The expected result is Negative.  Fact Sheet for Patients:  EntrepreneurPulse.com.au  Fact Sheet for Healthcare Providers:  IncredibleEmployment.be  This test is no t yet approved or cleared by the  [place] Architectural technologist and  has been authorized for detection and/or diagnosis of SARS-CoV-2 by FDA under an Print production planner (EUA). This EUA will remain  in effect (meaning this test can be used) for the duration of the COVID-19 declaration under Section 564(b)(1) of the Act, 21 U.S.C.section 360bbb-3(b)(1), unless the authorization is terminated  or revoked sooner.       Influenza A by PCR NEGATIVE NEGATIVE   Influenza B by PCR NEGATIVE NEGATIVE    Comment: (NOTE) The  Xpert Xpress SARS-CoV-2/FLU/RSV plus assay is intended as an aid in the diagnosis of influenza from Nasopharyngeal swab specimens and should not be used as a sole basis for treatment. Nasal washings and aspirates are unacceptable for Xpert Xpress SARS-CoV-2/FLU/RSV testing.  Fact Sheet for Patients: EntrepreneurPulse.com.au  Fact Sheet for Healthcare Providers: IncredibleEmployment.be  This test is not yet approved or cleared by the [place] FDA and has been authorized for detection and/or diagnosis of SARS-CoV-2 by FDA under an Emergency Use Authorization (EUA). This EUA will remain in effect (meaning this test can be used) for the duration of the COVID-19 declaration under Section 564(b)(1) of the Act, 21 U.S.C. section 360bbb-3(b)(1), unless the authorization is terminated or revoked.  Performed at [facility], [place] [address], [place], [place] [address]   I-Stat arterial blood gas, ED     Status: Abnormal   Collection Time: 07/23/21  9:13 PM  Result Value Ref Range   pH, Arterial 7.419 7.350 - 7.450   pCO2 arterial 61.9 (H) 32.0 - 48.0 mmHg   pO2, Arterial 355 (H) 83.0 - 108.0 mmHg   Bicarbonate 43.1 (H) 20.0 - 28.0 mmol/L   TCO2 46 (H) 22 - 32 mmol/L   O2 Saturation 100.0 %   Acid-Base Excess 15.0 (H) 0.0 - 2.0 mmol/L   Sodium 146 (H) 135 - 145 mmol/L   Potassium 3.5 3.5 - 5.1 mmol/L   Calcium, Ion 1.22 1.15 - 1.40 mmol/L   HCT 21.0 (L) 36.0 - 46.0 %   Hemoglobin 7.1 (L) 12.0 - 15.0 g/dL   Patient temperature 86.8 F    Collection site Radial    Drawn by RT    Sample type ARTERIAL    Comment NOTIFIED PHYSICIAN   I-Stat venous blood gas, ([facility])     Status: Abnormal   Collection Time: 07/23/21 10:00 PM  Result Value Ref Range   pH, Ven 7.302 7.250 - 7.430   pCO2, Ven 95.6 (HH) 44.0 - 60.0 mmHg   pO2, Ven 45.0 32.0 - 45.0 mmHg   Bicarbonate 47.2 (H) 20.0 - 28.0 mmol/L   TCO2 >50 (H) 22 - 32 mmol/L   O2 Saturation 72.0  %   Acid-Base Excess 18.0 (H) 0.0 - 2.0 mmol/L   Sodium 145 135 - 145 mmol/L   Potassium 4.3 3.5 - 5.1 mmol/L   Calcium, Ion 1.25 1.15 - 1.40 mmol/L   HCT 24.0 (L) 36.0 - 46.0 %   Hemoglobin 8.2 (L) 12.0 - 15.0 g/dL   Sample type VENOUS    Comment NOTIFIED PHYSICIAN   Troponin I (High Sensitivity)     Status: None   Collection Time: 07/23/21 10:02 PM  Result Value Ref Range   Troponin I (High Sensitivity) 9 <18 ng/L    Comment: (NOTE) Elevated high sensitivity troponin I (hsTnI) values and significant  changes across serial measurements may suggest ACS but many other  chronic and acute conditions are known to elevate hsTnI results.  Refer to the "  Links" section for chest pain algorithms and additional  guidance. Performed at [facility], [place] [address], [place], [place] [address]    CT HEAD WO CONTRAST (5MM)  Result Date: 07/23/2021 CLINICAL DATA:  Mental status change EXAM: CT HEAD WITHOUT CONTRAST TECHNIQUE: Contiguous axial images were obtained from the base of the skull through the vertex without intravenous contrast. COMPARISON:  MRI 10/13/2020, CT brain 09/29/2020 FINDINGS: Brain: No acute territorial infarction, hemorrhage or intracranial mass. Mild atrophy. Extensive white matter hypodensity consistent with chronic small vessel ischemic change. Stable ventricle size Vascular: No hyperdense vessels.  Carotid vascular calcification Skull: Normal. Negative for fracture or focal lesion. Sinuses/Orbits: Patchy mucosal thickening in the sinuses Other: None IMPRESSION: 1. No CT evidence for acute intracranial abnormality. 2. Mild atrophy. Extensive chronic small vessel ischemic changes of the white matter Electronically Signed   By: Donavan Foil M.D.   On: 07/23/2021 21:14   DG Chest Portable 1 View  Result Date: 07/23/2021 CLINICAL DATA:  Altered mental status.  Intubation. EXAM: PORTABLE CHEST 1 VIEW COMPARISON:  Chest x-ray 03/22/2020, CT angio chest 02/22/2021 FINDINGS:  Interval placement endotracheal tube with tip approximately 3 cm above the carina. Interval placement of an enteric tube coursing below the hemidiaphragm with tip and side port collimated off view. The heart and mediastinal contours are unchanged. Atherosclerotic plaque. Hilar vasculature prominence. Slight hyperinflation of the lungs. Interval increase in size of a right lung 2.5 cm density. No pulmonary edema. No pleural effusion. No pneumothorax. No acute osseous abnormality. Vascular clips overlie the neck. IMPRESSION: 1. Endotracheal tube with tip approximately 3 cm above the carina. 2. Enteric tube coursing below the hemidiaphragm with tip and side port collimated off view. 3. Interval increase in size of a right lung 2.5 cm density. Recommend CT chest for further evaluation. Electronically Signed   By: Iven Finn M.D.   On: 07/23/2021 21:00    Review of Systems  Constitutional: Negative.        ROS history given by patient's daughter  HENT: Negative.    Eyes: Negative.   Respiratory:  Positive for shortness of breath and wheezing. Negative for cough.        Chronic shortness of breath with little activity, on home Oxygen  Cardiovascular:  Negative for chest pain.  Gastrointestinal:  Negative for diarrhea, nausea and vomiting.       No recent GI bleed but has had GI bleed in the past  Genitourinary: Negative.   Musculoskeletal:  Positive for arthralgias.  Neurological:  Negative for dizziness, seizures, facial asymmetry, speech difficulty and headaches.  Psychiatric/Behavioral: Negative.     Blood pressure (!) 200/86, pulse 68, temperature (!) 97.5 F (36.4 C), temperature source Temporal, resp. rate 20, height 5\' 4"  (1.626 m), weight 74.8 kg, SpO2 100 %. Physical Exam Vitals reviewed.  Constitutional:      Comments: Intubated on vent support  HENT:     Head: Normocephalic and atraumatic.     Nose: Nose normal.     Mouth/Throat:     Comments: ET tube in place Eyes:      General: No scleral icterus.    Comments: Anisocoria L>R  Cardiovascular:     Rate and Rhythm: Normal rate and regular rhythm.     Pulses: Normal pulses.     Heart sounds: Normal heart sounds.  Pulmonary:     Breath sounds: No wheezing or rhonchi.     Comments: Vent: PRVC, RR 20, TV 440, PEEP 5, FIO2  100% Abdominal:     General: There is no distension.     Palpations: Abdomen is soft.  Musculoskeletal:     Cervical back: Neck supple.     Comments: Left foot oedema.  Feet warm but unable to doppler any pulses in bilateral extremities.  Skin:    General: Skin is warm and dry.     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     - HX of COPD on continuous home Oxygen 3 liters and Prednisone Plan: lung protective vent protocol, Duo nebs,wean FIO2 for sats. 88% per protocol. Vent wean and extubate per protocol.  2. Acute metabolic encephalopathy secondary to hypercarbia and hypoxia. CT head negative. Patient was still under induction medication when examined in the ED. Plan: will consider MRI and EEG if MS does not return to baseline.  3. Chronic anemia with history of GI bleed Plan: transfuse per protocol.  4. Peripheral vascular disease with no doppler peripheral pulses but with no lactic acidosis indicating not acute process. Plan: arterial doppler in AM.  5. Hx of lung cancer with intermittent palliative radiotherapy.        -  increased in the size of right lung density. Plan: CT chest defer to day team pending patient's stability.  6. Chronic right leg ulcer Plan: wound care consult  7. Hx hyperlipidemia Plan: restart Lipitor  8. Hx hypothyroidism Plan: restart Synthroid. Check TSH.  9. 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 Plan: chack MERSA screen  10. Hx GI  bleed in May Plan: SCD for DVT prophylaxis  Lines: PIVs Drips: LR Prophylaxis: Protonix, SCD Nutrition: dietitian consult Code: FULL  Family: daughter Holly [name] updated and goals of therapy discussed. She would like to continue with full code status.  Thank you for allowing me the privilege to care for this patient.  I have dedicated a total of 75 minutes in critical care time minus all appropriate exclusions.   Lafayette Dragon, MD 07/23/2021, 11:02 PM

## 2021-07-23 NOTE — ED Notes (Signed)
Noted improvement in RR, no change in responsiveness after IV 2 Narcan

## 2021-07-23 NOTE — ED Triage Notes (Signed)
PT BIB [facility] d/t unresponsiveness from home with daughter  LNK 9:30 pm 07/22/21. Was seen this morning by daughter in bed sleeping. On daughter arrival this afternoon pt was found in chair unresponsive. EMS reports similar episodes in the past with no formal diagnosis.

## 2021-07-23 NOTE — ED Notes (Signed)
Pt has continues IV Fent. Have not had need to use non-violent Restraints ordered

## 2021-07-24 ENCOUNTER — Inpatient Hospital Stay (HOSPITAL_COMMUNITY): Payer: Medicare Other

## 2021-07-24 ENCOUNTER — Other Ambulatory Visit: Payer: Self-pay

## 2021-07-24 DIAGNOSIS — R0989 Other specified symptoms and signs involving the circulatory and respiratory systems: Secondary | ICD-10-CM

## 2021-07-24 DIAGNOSIS — Z7189 Other specified counseling: Secondary | ICD-10-CM

## 2021-07-24 DIAGNOSIS — Z452 Encounter for adjustment and management of vascular access device: Secondary | ICD-10-CM

## 2021-07-24 DIAGNOSIS — A419 Sepsis, unspecified organism: Secondary | ICD-10-CM | POA: Diagnosis not present

## 2021-07-24 DIAGNOSIS — R6521 Severe sepsis with septic shock: Secondary | ICD-10-CM

## 2021-07-24 DIAGNOSIS — J9621 Acute and chronic respiratory failure with hypoxia: Secondary | ICD-10-CM | POA: Diagnosis not present

## 2021-07-24 LAB — PHOSPHORUS
Phosphorus: 1.2 mg/dL — ABNORMAL LOW (ref 2.5–4.6)
Phosphorus: 2 mg/dL — ABNORMAL LOW (ref 2.5–4.6)

## 2021-07-24 LAB — LACTIC ACID, PLASMA
Lactic Acid, Venous: 1.7 mmol/L (ref 0.5–1.9)
Lactic Acid, Venous: 2 mmol/L (ref 0.5–1.9)

## 2021-07-24 LAB — BASIC METABOLIC PANEL
Anion gap: 12 (ref 5–15)
BUN: 20 mg/dL (ref 8–23)
CO2: 35 mmol/L — ABNORMAL HIGH (ref 22–32)
Calcium: 9.5 mg/dL (ref 8.9–10.3)
Chloride: 95 mmol/L — ABNORMAL LOW (ref 98–111)
Creatinine, Ser: 1.23 mg/dL — ABNORMAL HIGH (ref 0.44–1.00)
GFR, Estimated: 46 mL/min — ABNORMAL LOW (ref >60)
Glucose, Bld: 109 mg/dL — ABNORMAL HIGH (ref 70–99)
Potassium: 3.8 mmol/L (ref 3.5–5.1)
Sodium: 142 mmol/L (ref 135–145)

## 2021-07-24 LAB — TROPONIN I (HIGH SENSITIVITY): Troponin I (High Sensitivity): 13 ng/L (ref <18)

## 2021-07-24 LAB — CBC
HCT: 30 % — ABNORMAL LOW (ref 36.0–46.0)
Hemoglobin: 8.4 g/dL — ABNORMAL LOW (ref 12.0–15.0)
MCH: 28.2 pg (ref 26.0–34.0)
MCHC: 28 g/dL — ABNORMAL LOW (ref 30.0–36.0)
MCV: 100.7 fL — ABNORMAL HIGH (ref 80.0–100.0)
Platelets: 201 10*3/uL (ref 150–400)
RBC: 2.98 MIL/uL — ABNORMAL LOW (ref 3.87–5.11)
RDW: 16.3 % — ABNORMAL HIGH (ref 11.5–15.5)
WBC: 15.7 10*3/uL — ABNORMAL HIGH (ref 4.0–10.5)
nRBC: 0.1 % (ref 0.0–0.2)

## 2021-07-24 LAB — HEMOGLOBIN A1C
Hgb A1c MFr Bld: 5 % (ref 4.8–5.6)
Mean Plasma Glucose: 96.8 mg/dL

## 2021-07-24 LAB — BLOOD GAS, ARTERIAL
Acid-Base Excess: 14.5 mmol/L — ABNORMAL HIGH (ref 0.0–2.0)
Bicarbonate: 39.4 mmol/L — ABNORMAL HIGH (ref 20.0–28.0)
FIO2: 50
O2 Saturation: 99.4 %
Patient temperature: 37
pCO2 arterial: 57.1 mmHg — ABNORMAL HIGH (ref 32.0–48.0)
pH, Arterial: 7.453 — ABNORMAL HIGH (ref 7.350–7.450)
pO2, Arterial: 128 mmHg — ABNORMAL HIGH (ref 83.0–108.0)

## 2021-07-24 LAB — AMMONIA: Ammonia: 82 umol/L — ABNORMAL HIGH (ref 9–35)

## 2021-07-24 LAB — MRSA NEXT GEN BY PCR, NASAL: MRSA by PCR Next Gen: NOT DETECTED

## 2021-07-24 LAB — GLUCOSE, CAPILLARY
Glucose-Capillary: 97 mg/dL (ref 70–99)
Glucose-Capillary: 118 mg/dL — ABNORMAL HIGH (ref 70–99)
Glucose-Capillary: 137 mg/dL — ABNORMAL HIGH (ref 70–99)
Glucose-Capillary: 160 mg/dL — ABNORMAL HIGH (ref 70–99)
Glucose-Capillary: 179 mg/dL — ABNORMAL HIGH (ref 70–99)

## 2021-07-24 LAB — TSH: TSH: 0.148 u[IU]/mL — ABNORMAL LOW (ref 0.350–4.500)

## 2021-07-24 LAB — MAGNESIUM
Magnesium: 1.8 mg/dL (ref 1.7–2.4)
Magnesium: 1.7 mg/dL (ref 1.7–2.4)

## 2021-07-24 LAB — CBG MONITORING, ED: Glucose-Capillary: 145 mg/dL — ABNORMAL HIGH (ref 70–99)

## 2021-07-24 IMAGING — CT CT CHEST-ABD-PELV W/O CM
2 of 5 series · 14 of 46 positions shown, 16 images · non-contrast
Comparison: CT angio chest [DATE], CT abdomen and pelvis
[DATE]

CLINICAL DATA: Sepsis, found unresponsive at home

EXAM:
CT CHEST, ABDOMEN AND PELVIS WITHOUT CONTRAST
TECHNIQUE: Multidetector CT imaging of the chest, abdomen and pelvis was
performed following the standard protocol without IV contrast.
Sagittal and coronal MPR images reconstructed from axial data set.
No oral contrast administered.

[Series 5: cap w/o 2.0 mm st · axial · non-contrast · 0.98mm/px · z∈[-712,-202]mm · 11 of 293 slices shown, 13 images]
[im 19/293  soft-tissue]
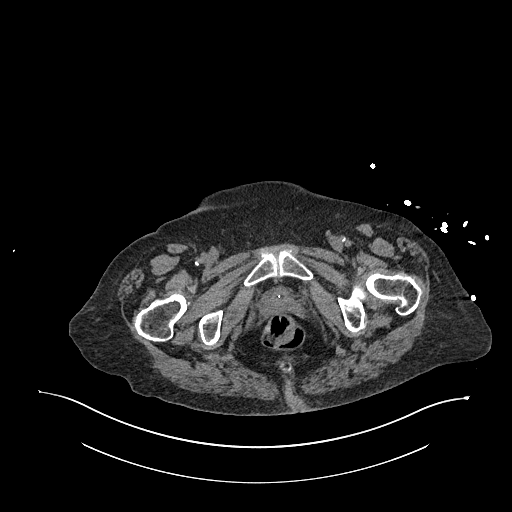
[im 19/293  bone]
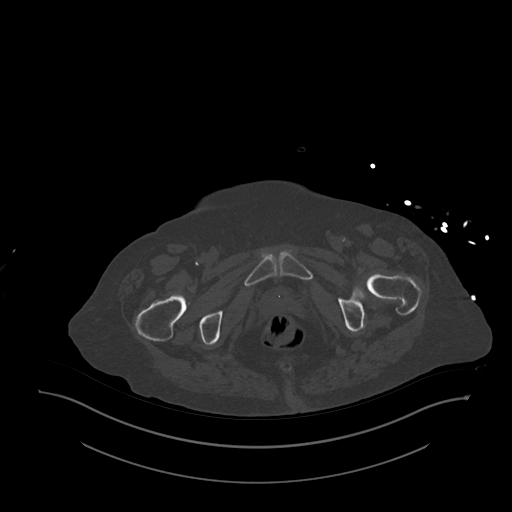
[im 55/293  soft-tissue]
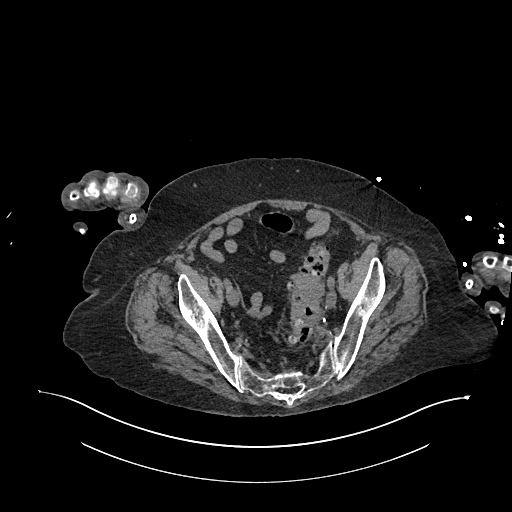
[im 74/293  soft-tissue]
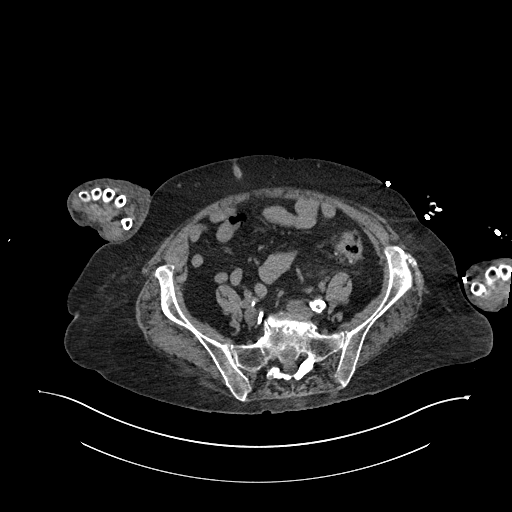
[im 92/293  soft-tissue]
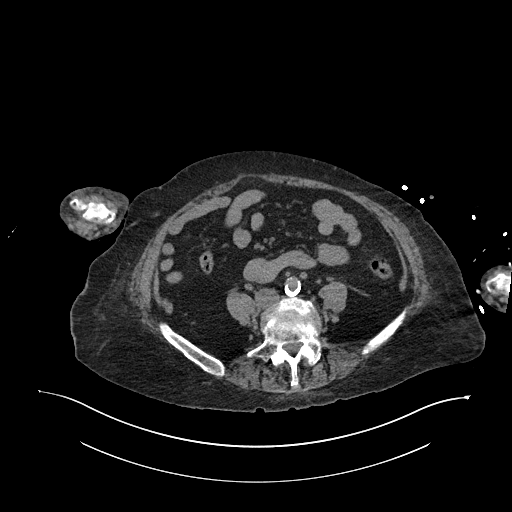
[im 128/293  soft-tissue]
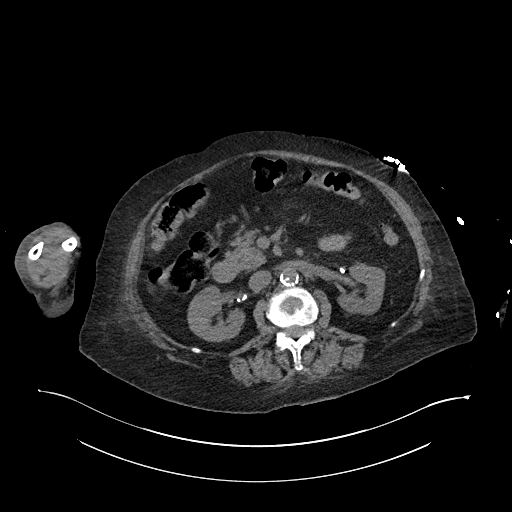
[im 147/293  soft-tissue]
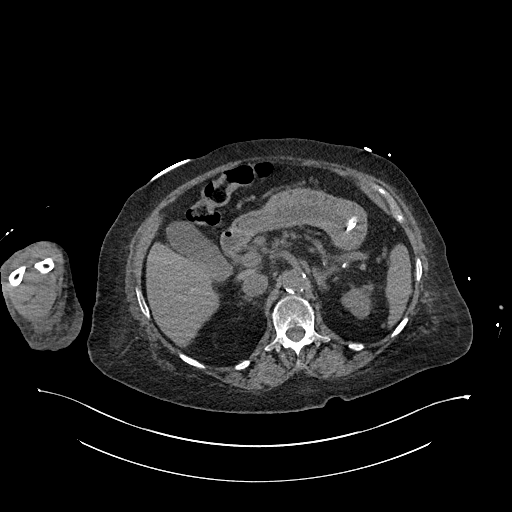
[im 165/293  soft-tissue]
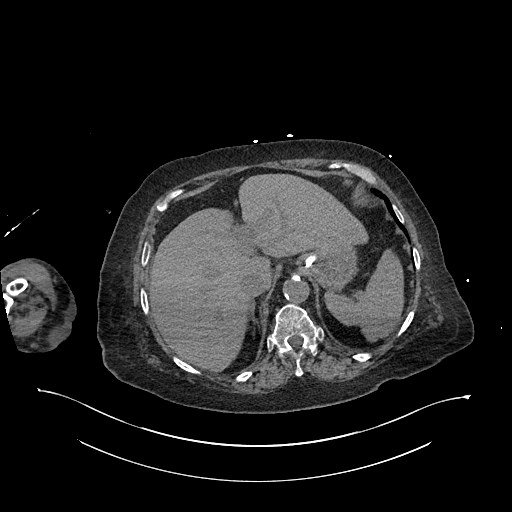
[im 201/293  soft-tissue]
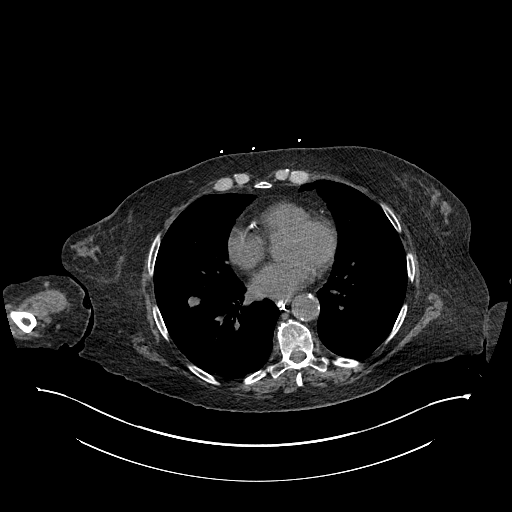
[im 220/293  soft-tissue]
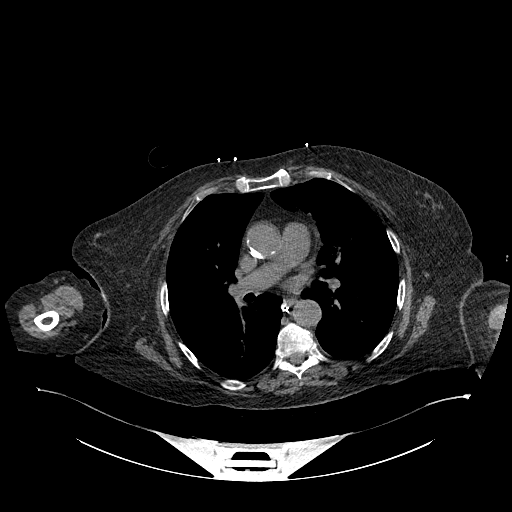
[im 220/293  bone]
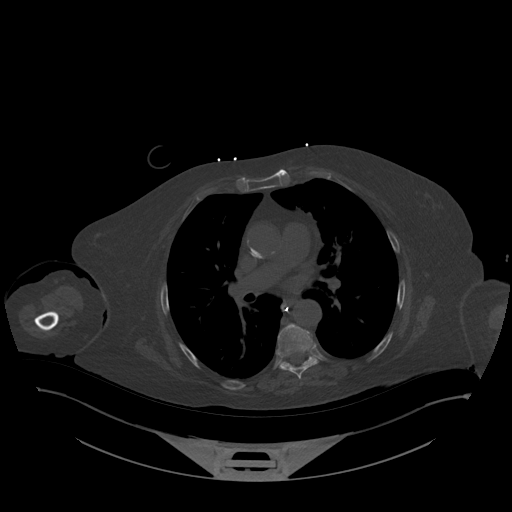
[im 238/293  soft-tissue]
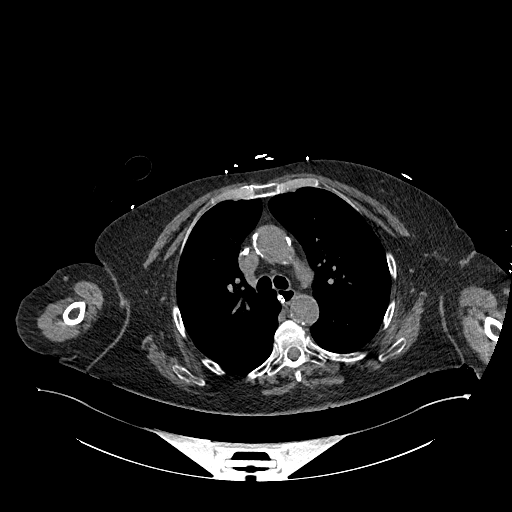
[im 274/293  soft-tissue]
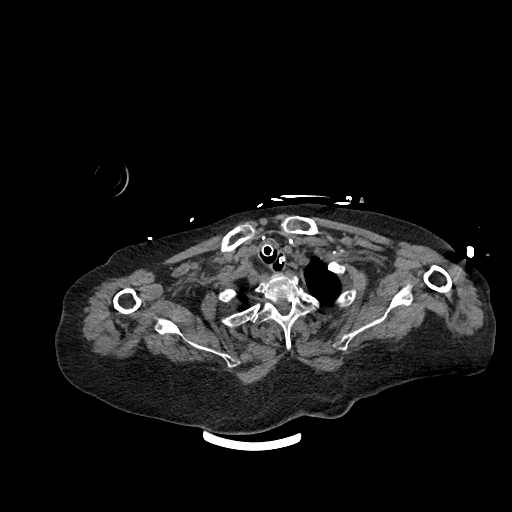

[Series 7: cap w/o 3.0 mm st cor · coronal · non-contrast · 0.68mm/px · 3 of 117 slices shown]
[im 39/117  soft-tissue]
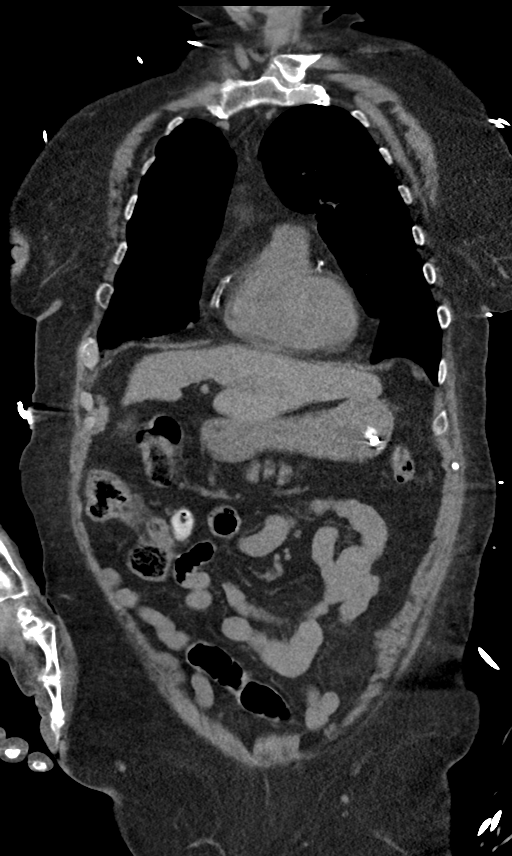
[im 52/117  soft-tissue]
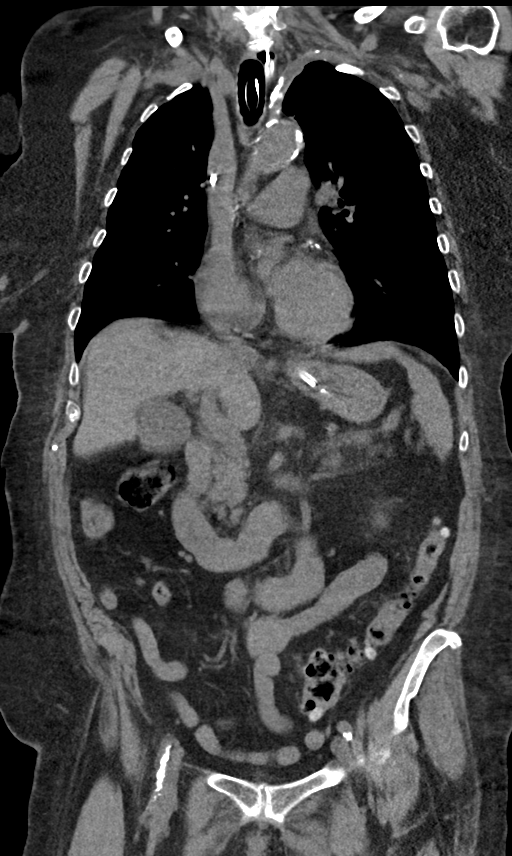
[im 65/117  soft-tissue]
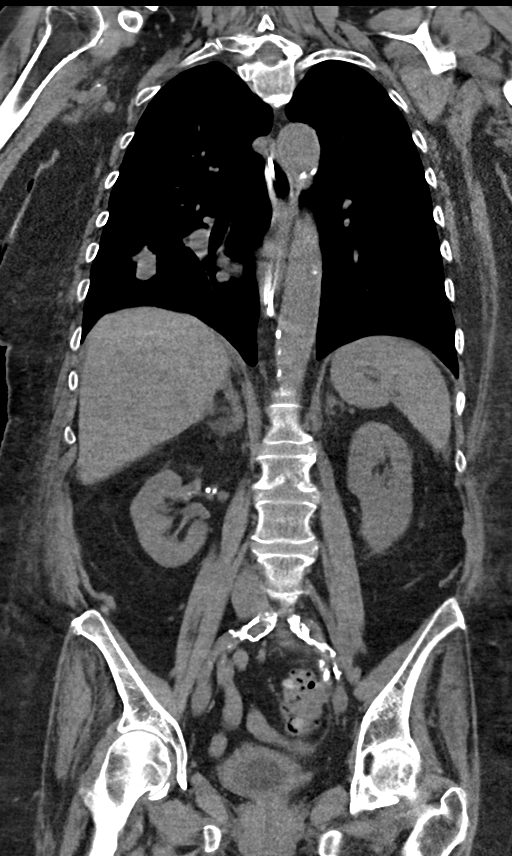

[14 of 46 positions shown; findings below may reference images not displayed]

FINDINGS: CT CHEST FINDINGS

Cardiovascular: Atherosclerotic calcifications aorta, proximal great
vessels and coronary arteries. Aorta normal caliber. Heart size
normal. No pericardial effusion.

Mediastinum/Nodes: Nasogastric tube traverses esophagus into
stomach. Tip of endotracheal tube just above carina. Prior
thyroidectomy. No thoracic adenopathy.

Lungs/Pleura: Emphysematous changes and central peribronchial
thickening consistent with COPD. RIGHT middle lobe mass 2.0 x 1.7 x
2.4 cm increased since 10 mm diameter on [DATE] minimal
atelectasis RIGHT lower lobe. Scarring in LEFT upper lobe stable. No
pulmonary infiltrate pleural effusion, or pneumothorax.

Musculoskeletal: Osseous demineralization. No focal osseous lesions.

CT ABDOMEN PELVIS FINDINGS

Hepatobiliary: Gallbladder and liver normal appearance

Pancreas: Normal appearance

Spleen: Normal appearance

Adrenals/Urinary Tract: LEFT adrenal mass 15 x 12 mm unchanged
low-attenuation consistent with adrenal adenoma. Adrenal glands,
kidneys, and ureters otherwise normal appearance. Bladder
decompressed by Foley catheter.

Stomach/Bowel: Diverticulosis of descending and sigmoid colon.
Minimal wall thickening sigmoid colon. Questionable subtle pericolic
inflammatory changes. Cannot exclude subtle diverticulitis. Stomach
decompressed by NG tube. Remaining bowel loops unremarkable. Normal
appendix.

Vascular/Lymphatic: Atherosclerotic calcifications aorta and iliac
arteries without aneurysm. No adenopathy

Reproductive: Uterus surgically absent. Questionable visualization
of an atrophic LEFT ovary.

Other: No free air or free fluid.  No hernia.

Musculoskeletal: Osseous demineralization.
IMPRESSION: Increased size of a RIGHT middle lobe mass 2.0 x 1.7 x 2.4 cm,
previously 10 mm diameter on [DATE], suspicious for a pulmonary
neoplasm.

COPD changes with minimal RIGHT lower lobe atelectasis.

LEFT adrenal adenoma.

Distal colonic diverticulosis with questionable subtle wall
thickening and pericolic inflammatory changes at the sigmoid colon,
cannot exclude subtle diverticulitis.

No other acute intrathoracic, intra-abdominal or intrapelvic
abnormalities.

Aortic Atherosclerosis ([VU]-[VU]).

## 2021-07-24 MED ORDER — LACTULOSE 10 GM/15ML PO SOLN
30.0000 g | Freq: Three times a day (TID) | ORAL | Status: DC
  Administered 2021-07-24 (×3): 30 g
  Filled 2021-07-24 (×4): qty 45

## 2021-07-24 MED ORDER — VANCOMYCIN HCL 1500 MG/300ML IV SOLN
1500.0000 mg | Freq: Once | INTRAVENOUS | Status: AC
  Administered 2021-07-24: 1500 mg via INTRAVENOUS
  Filled 2021-07-24: qty 300

## 2021-07-24 MED ORDER — PHENYLEPHRINE HCL-NACL 20-0.9 MG/250ML-% IV SOLN
25.0000 ug/min | INTRAVENOUS | Status: DC
  Administered 2021-07-24: 175 ug/min via INTRAVENOUS
  Administered 2021-07-24 (×2): 85 ug/min via INTRAVENOUS
  Administered 2021-07-24: 25 ug/min via INTRAVENOUS
  Filled 2021-07-24 (×3): qty 250

## 2021-07-24 MED ORDER — ALPRAZOLAM 0.5 MG PO TABS
0.5000 mg | ORAL_TABLET | Freq: Two times a day (BID) | ORAL | Status: DC | PRN
  Administered 2021-07-24 – 2021-07-25 (×2): 0.5 mg
  Filled 2021-07-24 (×2): qty 1

## 2021-07-24 MED ORDER — CHLORHEXIDINE GLUCONATE 0.12% ORAL RINSE (MEDLINE KIT)
15.0000 mL | Freq: Two times a day (BID) | OROMUCOSAL | Status: DC
  Administered 2021-07-24 – 2021-07-26 (×6): 15 mL via OROMUCOSAL

## 2021-07-24 MED ORDER — VANCOMYCIN HCL 750 MG/150ML IV SOLN
750.0000 mg | INTRAVENOUS | Status: DC
  Administered 2021-07-25 – 2021-07-26 (×2): 750 mg via INTRAVENOUS
  Filled 2021-07-24 (×3): qty 150

## 2021-07-24 MED ORDER — SODIUM CHLORIDE 0.9 % IV SOLN
2.0000 g | Freq: Two times a day (BID) | INTRAVENOUS | Status: AC
  Administered 2021-07-24 – 2021-07-30 (×14): 2 g via INTRAVENOUS
  Filled 2021-07-24 (×15): qty 2

## 2021-07-24 MED ORDER — VITAL AF 1.2 CAL PO LIQD
1000.0000 mL | ORAL | Status: DC
  Administered 2021-07-24 – 2021-07-25 (×2): 1000 mL

## 2021-07-24 MED ORDER — ORAL CARE MOUTH RINSE
15.0000 mL | OROMUCOSAL | Status: DC
  Administered 2021-07-24 – 2021-07-26 (×24): 15 mL via OROMUCOSAL

## 2021-07-24 MED ORDER — SODIUM PHOSPHATES 45 MMOLE/15ML IV SOLN
30.0000 mmol | Freq: Once | INTRAVENOUS | Status: AC
  Administered 2021-07-25: 30 mmol via INTRAVENOUS
  Filled 2021-07-24: qty 10

## 2021-07-24 MED ORDER — MAGNESIUM SULFATE 2 GM/50ML IV SOLN
2.0000 g | Freq: Once | INTRAVENOUS | Status: AC
  Administered 2021-07-24: 2 g via INTRAVENOUS
  Filled 2021-07-24: qty 50

## 2021-07-24 MED ORDER — PROPOFOL 1000 MG/100ML IV EMUL
0.0000 ug/kg/min | INTRAVENOUS | Status: DC
  Administered 2021-07-24 – 2021-07-25 (×2): 10 ug/kg/min via INTRAVENOUS
  Administered 2021-07-26: 15 ug/kg/min via INTRAVENOUS
  Filled 2021-07-24 (×4): qty 100

## 2021-07-24 MED ORDER — SODIUM CHLORIDE 0.9 % IV BOLUS: 1000.0000 mL | Freq: Once | INTRAVENOUS | Status: DC

## 2021-07-24 MED ORDER — PREDNISONE 10 MG PO TABS
10.0000 mg | ORAL_TABLET | Freq: Every day | ORAL | Status: DC
  Administered 2021-07-24: 10 mg
  Filled 2021-07-24: qty 1

## 2021-07-24 MED ORDER — DOCUSATE SODIUM 50 MG/5ML PO LIQD
100.0000 mg | Freq: Two times a day (BID) | ORAL | Status: DC
  Administered 2021-07-24 – 2021-07-26 (×4): 100 mg
  Filled 2021-07-24 (×4): qty 10

## 2021-07-24 MED ORDER — NOREPINEPHRINE 4 MG/250ML-% IV SOLN
0.0000 ug/min | INTRAVENOUS | Status: DC
  Administered 2021-07-24: 2 ug/min via INTRAVENOUS
  Filled 2021-07-24: qty 250

## 2021-07-24 MED ORDER — LACTATED RINGERS IV BOLUS
500.0000 mL | Freq: Once | INTRAVENOUS | Status: AC
  Administered 2021-07-24: 500 mL via INTRAVENOUS

## 2021-07-24 MED ORDER — VASOPRESSIN 20 UNITS/100 ML INFUSION FOR SHOCK
0.0000 [IU]/min | INTRAVENOUS | Status: DC
  Administered 2021-07-24: 0.03 [IU]/min via INTRAVENOUS
  Filled 2021-07-24: qty 100

## 2021-07-24 MED ORDER — HYDROCORTISONE SOD SUC (PF) 100 MG IJ SOLR
100.0000 mg | Freq: Three times a day (TID) | INTRAMUSCULAR | Status: DC
  Administered 2021-07-24 – 2021-07-26 (×6): 100 mg via INTRAVENOUS
  Filled 2021-07-24 (×6): qty 2

## 2021-07-24 MED ORDER — RIFAXIMIN 550 MG PO TABS: 550.0000 mg | ORAL_TABLET | Freq: Two times a day (BID) | ORAL | Status: DC

## 2021-07-24 MED ORDER — INSULIN ASPART 100 UNIT/ML IJ SOLN
0.0000 [IU] | INTRAMUSCULAR | Status: DC
  Administered 2021-07-24: 1 [IU] via SUBCUTANEOUS
  Administered 2021-07-24: 2 [IU] via SUBCUTANEOUS
  Administered 2021-07-25: 3 [IU] via SUBCUTANEOUS
  Administered 2021-07-25: 2 [IU] via SUBCUTANEOUS

## 2021-07-24 MED ORDER — PROSOURCE TF PO LIQD
45.0000 mL | Freq: Two times a day (BID) | ORAL | Status: DC
  Administered 2021-07-24 – 2021-07-26 (×4): 45 mL
  Filled 2021-07-24 (×6): qty 45

## 2021-07-24 MED ORDER — POLYETHYLENE GLYCOL 3350 17 G PO PACK
17.0000 g | PACK | Freq: Every day | ORAL | Status: DC
  Administered 2021-07-25 – 2021-07-26 (×2): 17 g
  Filled 2021-07-24 (×3): qty 1

## 2021-07-24 MED ORDER — LACTATED RINGERS IV SOLN: INTRAVENOUS | Status: DC

## 2021-07-24 MED ORDER — MIDAZOLAM HCL 2 MG/2ML IJ SOLN
2.0000 mg | INTRAMUSCULAR | Status: AC | PRN
Start: 2021-07-24 — End: 2021-07-26
  Administered 2021-07-26 (×3): 2 mg via INTRAVENOUS
  Filled 2021-07-24 (×2): qty 2

## 2021-07-24 MED ORDER — K PHOS MONO-SOD PHOS DI & MONO 155-852-130 MG PO TABS
500.0000 mg | ORAL_TABLET | ORAL | Status: AC
Start: 2021-07-24 — End: 2021-07-24
  Administered 2021-07-24 (×2): 500 mg
  Filled 2021-07-24 (×2): qty 2

## 2021-07-24 MED ORDER — LACTATED RINGERS IV BOLUS
1000.0000 mL | Freq: Once | INTRAVENOUS | Status: AC
  Administered 2021-07-24: 1000 mL via INTRAVENOUS

## 2021-07-24 MED ORDER — MIDAZOLAM HCL 2 MG/2ML IJ SOLN
2.0000 mg | INTRAMUSCULAR | Status: DC | PRN
  Filled 2021-07-24: qty 2

## 2021-07-24 MED ORDER — SODIUM CHLORIDE 0.9 % IV SOLN
250.0000 mL | INTRAVENOUS | Status: DC
  Administered 2021-07-24 (×2): 250 mL via INTRAVENOUS

## 2021-07-24 MED ORDER — STERILE WATER FOR INJECTION IJ SOLN
INTRAMUSCULAR | Status: AC
  Administered 2021-07-24: 10 mL
  Filled 2021-07-24: qty 10

## 2021-07-24 MED ORDER — SODIUM CHLORIDE 0.9% FLUSH
10.0000 mL | Freq: Two times a day (BID) | INTRAVENOUS | Status: DC
  Administered 2021-07-24 – 2021-07-27 (×6): 10 mL
  Administered 2021-07-27: 20 mL
  Administered 2021-07-28 – 2021-08-02 (×11): 10 mL

## 2021-07-24 MED ORDER — CHLORHEXIDINE GLUCONATE CLOTH 2 % EX PADS
6.0000 | MEDICATED_PAD | Freq: Every day | CUTANEOUS | Status: DC
  Administered 2021-07-24 – 2021-07-29 (×7): 6 via TOPICAL

## 2021-07-24 MED ORDER — NOREPINEPHRINE 4 MG/250ML-% IV SOLN
0.0000 ug/min | INTRAVENOUS | Status: DC
  Administered 2021-07-25: 3 ug/min via INTRAVENOUS
  Administered 2021-07-26: 1 ug/min via INTRAVENOUS
  Filled 2021-07-24 (×2): qty 250

## 2021-07-24 MED ORDER — SODIUM CHLORIDE 0.9% FLUSH
10.0000 mL | INTRAVENOUS | Status: DC | PRN
  Administered 2021-07-27: 10 mL

## 2021-07-24 NOTE — Progress Notes (Signed)
Initial Nutrition Assessment  DOCUMENTATION CODES:   Not applicable  INTERVENTION:   Tube Feeding via OG:  Vital AF 1.2 at 50 ml/hr Begin TF at 20 ml/hr; titrate by 10 q 8 hours until goal rate of 50 ml/hr Pro-Source TF 45 mL BID Provides 1520 kcals, 112 g of protein and  Phosphorus <2.0; recommend aggressive supplementation   NUTRITION DIAGNOSIS:   Inadequate oral intake related to acute illness as evidenced by NPO status.  GOAL:   Patient will meet greater than or equal to 90% of their needs  MONITOR:   Vent status, TF tolerance, Labs, Weight trends  REASON FOR ASSESSMENT:   Ventilator, Consult Enteral/tube feeding initiation and management  ASSESSMENT:   75 yo female admitted with acute on chronic respiratory failure requiring intubation, septic shockPMH includes COPD on home oxygen, lung cancer dx 5 years ago on intermittent palliative XRT, PVD, chronic anemia, HLD  Patient is currently intubated on ventilator support, sedated, requiring phenylephrine MV: 8.3 L/min Temp (24hrs), Avg:98.5 F (36.9 C), Min:97.3 F (36.3 C), Max:100.7 F (38.2 C)  Propofol: NONE  CT chest/abdomen/pelvis pending  OG tube extends into stomach per chest xray  Unable to obtain diet and weight history. However, per weight encounters, pt has experienced weight loss over the past 4 months. Current wt 66.1 kg; weight of 72.6 kg in June. 9% wt loss  Phosphorus <2.0, per RN this is being supplemented  Labs: phosphorus 1.2 (L), ammonia 82 (H) Meds: colace, ss novolog, lactulose, miralax  NUTRITION - FOCUSED PHYSICAL EXAM:  Unable to perform  Diet Order:   Diet Order             Diet NPO time specified  Diet effective now                   EDUCATION NEEDS:   Not appropriate for education at this time  Skin:  Skin Assessment: Reviewed RN Assessment  Last BM:  PTA  Height:   Ht Readings from Last 1 Encounters:  07/23/21 5\' 4"  (1.626 m)    Weight:   Wt  Readings from Last 1 Encounters:  07/24/21 66.1 kg     BMI:  Body mass index is 25.01 kg/m.  Estimated Nutritional Needs:   Kcal:  1520 kcals  Protein:  100-120 g  Fluid:  >/= 1.5 L   Kerman Passey MS, RDN, LDN, CNSC Registered Dietitian III Clinical Nutrition RD Pager and On-Call Pager Number Located in [place]

## 2021-07-24 NOTE — ED Notes (Signed)
Admitting provider informed this RN unable to located dorsalis pedis pulses via doppler. Will request second RN to verify. No new orders at this time.

## 2021-07-24 NOTE — Progress Notes (Signed)
[facility] Progress Note Patient Name: WHISPER KURKA DOB: Jan 12, 1946 MRN: [id number]   Date of Service  07/24/2021  HPI/Events of Note  Hypotension - BP = 66/55 with MAP = 60. Currently getting LR bolus. Sedation being held.  No CVL or CVP.  eICU Interventions  Plan: Phenylephrine IV infusion via PIV. Titrate to MAP >= 65.     Intervention Category Major Interventions: Hypotension - evaluation and management  Dicie Edelen Eugene 07/24/2021, 1:22 AM

## 2021-07-24 NOTE — Progress Notes (Signed)
eLink Physician-Brief Progress Note Patient Name: Holly [name] DOB: 07-15-1946 MRN: [id number]   Date of Service  07/24/2021  HPI/Events of Note  Hypotension - SBP = 70s - 80s. Almost maxed out on Phenylephrine IV infusion via PIV.  eICU Interventions  Plan: Add Vasopressin IV infusion at shock dose.  Bolus with 0.9 NaCl 1 liter IV over 1 hour now. Will notify PCCM ground team of need for central venous line.      Intervention Category Major Interventions: Hypotension - evaluation and management  Keyleen Cerrato Eugene 07/24/2021, 3:54 AM

## 2021-07-24 NOTE — Progress Notes (Signed)
eLink Physician-Brief Progress Note Patient Name: Holly [name] DOB: 22-Dec-1945 MRN: [id number]   Date of Service  07/24/2021  HPI/Events of Note  Hypophosphatemia  Hypomagnesemia - PO4--- = 2.0, Mg++ = 1.7 and Creatinine = 1.23.  eICU Interventions  Will replace PO4--- and Mg++.     Intervention Category Major Interventions: Electrolyte abnormality - evaluation and management  Inda Mcglothen Eugene 07/24/2021, 10:43 PM

## 2021-07-24 NOTE — Progress Notes (Signed)
Pharmacy Antibiotic Note  Holly [name] is a 75 y.o. female admitted on 07/23/2021 with sepsis.  Pharmacy has been consulted for Vancomycin/cefepime dosing.  Renal function acutely worsening Scr 0.87>>1.23 Unclear source of infection Hemodynamic instability overnight Lactic acid 0.6>> 2.0 Planning to pan scan patient  Plan: Vancomycin 1500mg  LD x1 followed by vancomycin 750mg  q24hr eAUC 488  Cefepime 2gm q12hr Plan to obtain levels at steady state  Will monitor for acute changes in renal function and adjust as needed F/u cultures results and de-escalate as appropriate   Height: 5\' 4"  (162.6 cm) Weight: 66.1 kg (145 lb 11.6 oz) IBW/kg (Calculated) : 54.7  Temp (24hrs), Avg:98.5 F (36.9 C), Min:97.3 F (36.3 C), Max:100.7 F (38.2 C)  Recent Labs  Lab 07/23/21 2027 07/23/21 2330 07/24/21 0641  WBC 15.2*  --  15.7*  CREATININE 0.87  --  1.23*  LATICACIDVEN 0.6 2.0*  --     Estimated Creatinine Clearance: 36.4 mL/min (A) (by C-G formula based on SCr of 1.23 mg/dL (H)).    Allergies  Allergen Reactions   Amlodipine Besylate Swelling    Edema   Codeine Nausea And Vomiting   Oxycodone-Acetaminophen Nausea And Vomiting    Antimicrobials this admission: Vancomycin 10/8 >>  Cefepime 10/8 >>   Dose adjustments this admission: none  Microbiology results: 10/7 BCx: IP 10/8 MRSA PCR: negative  Thank you for allowing pharmacy to be a part of this patient's care.  Donnald Garre, PharmD Clinical Pharmacist  Please check AMION for all [facility] numbers After 10:00 PM, call [facility] [phone]

## 2021-07-24 NOTE — ED Notes (Addendum)
After speaking with ICU Carelink Dr Emmit Alexanders. Initiated phenylephrine currently at 35 mcg/min BP 91/71. RN able to restart fent drip for sedation.

## 2021-07-24 NOTE — Progress Notes (Signed)
VASCULAR LAB    ABIs have been performed.  See CV proc for preliminary results.   Abbigail Anstey, RVT 07/24/2021, 10:24 AM

## 2021-07-24 NOTE — ED Provider Notes (Signed)
I have personally seen and examined the patient. I have reviewed the documentation on PMH/FH/Soc Hx. I have discussed the plan of care with the resident and patient.  I have reviewed and agree with the resident's documentation. Please see associated encounter note.  Briefly, the patient is a 75 y.o. female here with hypertension, high cholesterol, COPD on 2 L of oxygen, lung cancer on radiation therapy.  Patient arrives obtunded by EMS.  She has GCS of 3 on arrival.  Patient was found by her daughter nonresponsive in her chair and her living room.  Patient was last seen normal last night at 9:30 PM.  Daughter went to work this morning and the patient was still asleep.  She had someone come over the house in the afternoon who saw her around 130 and she was acting slightly off but was able to eat with her friend and then she left.  Patient arrives here with overall unremarkable vitals.  She is on nasal cannula with good oxygenation.  She has diminished breath sounds throughout.  Her pupils are equal and reactive.  She is not on blood thinners.  Given her state with GCS of 3 she was intubated for airway protection.  Chest x-ray showed no infectious process.  Head CT showed no head bleed.  She had a leukocytosis of 15 but normal lactic acid.  Her blood gas showed a pH of 7.1 and CO2 greater than 110.  Overall suspect acute hypoxic hypercapnic respiratory failure likely in the setting of COPD.  ACS and PE seem less likely.  EKG shows sinus rhythm.  No ischemic changes.  Troponin and BNP within normal limits.  Patient to be admitted to the medical ICU for further care.  Patient not making any purposeful movements on mechanical ventilator with minimal sedation.  Family would like patient to be full code still.  This chart was dictated using voice recognition software.  Despite best efforts to proofread,  errors can occur which can change the documentation meaning.   .Critical Care Performed by: Lennice Sites,  DO Authorized by: Lennice Sites, DO   Critical care provider statement:    Critical care time (minutes):  45   Critical care was necessary to treat or prevent imminent or life-threatening deterioration of the following conditions:  Respiratory failure   Critical care was time spent personally by me on the following activities:  Blood draw for specimens, development of treatment plan with patient or surrogate, discussions with primary provider, evaluation of patient's response to treatment, examination of patient, obtaining history from patient or surrogate, ordering and performing treatments and interventions, ordering and review of laboratory studies, ordering and review of radiographic studies, pulse oximetry, re-evaluation of patient's condition and review of old charts   I assumed direction of critical care for this patient from another provider in my specialty: no     Care discussed with: admitting provider    I supervised all procedures including intubation by resident.   EKG Interpretation  Date/Time:  Friday July 23 2021 20:21:31 EDT Ventricular Rate:  75 PR Interval:  136 QRS Duration: 98 QT Interval:  393 QTC Calculation: 439 R Axis:   42 Text Interpretation: Sinus rhythm ST elevation, consider inferior injury Baseline wander in lead(s) V5 Confirmed by Ronnald Nian, Goldman Birchall ([id number]) on 07/23/2021 8:50:01 PM          Lennice Sites, DO 07/24/21 0011

## 2021-07-24 NOTE — ED Notes (Signed)
Difficulty obtaining labs. Attempted x3, lab samples for lactic acid and ammonia level sent. Requested lab for collections of remaining labs

## 2021-07-24 NOTE — Progress Notes (Addendum)
NAME:  Holly [name], MRN:  [id number], DOB:  Nov 21, 1945, LOS: 1 ADMISSION DATE:  07/23/2021, CONSULTATION DATE:  07/23/21 REFERRING MD:   CHIEF COMPLAINT:   Unresponsiveness  History of Present Illness:  75 year old lady with PMH significant for COPD on home Oxygen and chronic steroid therapy, lung cancer diagnosed 5 years ago on intermittent palliative XRT, hypothyroidism among other conditions.  The patient was brought to the ED because of unresponsiveness.  The patient's daughter with whom she lives with, reported that her mother was alone on the day of admission because the companion that stays with her while the daughter is at work was not available.  The daughter called to check on her mother at lunchtime and her mother did not answer the phone.  Hence the daughter asked a friend to check in on her Mum.  The friend was able to assist the mother with her meal and into the lift recliner.   The daughter arrived home around 7pm and found her mother unresponsive so EMS was called.  Upon arrival to the ED, the patient had a GCS of 3 so she was intubated. CT head was negative but a venous blood gas found the patient to have a PCO2 of 95.6.  CXR did not show any acute pathology and her glucose was normal.  The patient was therefor referred for ICU admission.   Pertinent  Medical History  COPD, chronic hypoxemic respiratory failure, lung cancer diagnosed 5 years ago on intermittent palliative XRT, hypothyroidism, OSA not on CPAP  Significant Hospital Events: Including procedures, antibiotic start and stop dates in addition to other pertinent events   10/7 Admitted for unresponsiveness.  10/8 Hypotensive overnight and required vasopressor support  Interim History / Subjective:  Hypotensive overnight and required vasopressor support  Objective   Blood pressure (!) 94/45, pulse 77, temperature (!) 100.7 F (38.2 C), temperature source Oral, resp. rate 20, height 5\' 4"  (1.626 m), weight 66.1 kg, SpO2  97 %. CVP:  [14 mmHg] 14 mmHg  Vent Mode: PRVC FiO2 (%):  [40 %-100 %] 40 % Set Rate:  [20 bmp] 20 bmp Vt Set:  [440 mL] 440 mL PEEP:  [5 cmH20] 5 cmH20 Plateau Pressure:  [17 cmH20-24 cmH20] 17 cmH20   Intake/Output Summary (Last 24 hours) at 07/24/2021 1035 Last data filed at 07/24/2021 0900 Gross per 24 hour  Intake 3283.86 ml  Output 935 ml  Net 2348.86 ml   Filed Weights   07/23/21 2042 07/24/21 0500  Weight: 74.8 kg 66.1 kg   Physical Exam: General: Chronically ill, elderly-appearing, no acute distress HENT: [place], AT, ETT I nplace Eyes: EOMI, no scleral icterus Respiratory: Diminished breath sounds bilaterally.  No crackles, wheezing or rales Cardiovascular: RRR, -M/R/G, no JVD GI: BS+, soft, nontender Extremities:-Edema,-tenderness Neuro: Sedated  Resolved Hospital Problem list     Assessment & Plan:   Acute on chronic hypoxic and hypercarbic respiratory failure now on mechanical ventilation. HX of COPD on continuous home Oxygen 3 liters and chronic prednisone OSA not on CPAP Plan:  --LTVV. wean FIO2 for sats. 88% per protocol.  --Vent wean and extubate per protocol. --Scheduled Duonebs --VAP --PAD protocol for RASS goal 0  Septic shock --Start broad spectrum antibiotics --LR bolus --F/u blood cultures --Pan scan for etiology of infection --Trend LA --Start stress dose steroids q6h in setting of chronic prednisone use   Acute metabolic encephalopathy secondary to hypercarbia and hypoxia.  CT head negative. Patient was still under induction medication when  examined in the ED. Plan:  --Hold sedating medications --Consider MRI and EEG if MS does not return to baseline   Hx of lung cancer with intermittent palliative radiotherapy.        -  increased in the size of right lung density. 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 --SCD for DVT prophylaxis    Best Practice (right click and "Reselect all SmartList Selections" daily)   Diet/type: tubefeeds DVT prophylaxis: SCD GI prophylaxis: PPI Lines: Central line Foley:  Yes, and it is still needed Code Status:  full code Last date of multidisciplinary goals of care discussion [N/A]  Labs   CBC: Recent Labs  Lab 07/23/21 2027 07/23/21 2113 07/23/21 2200 07/24/21 0641  WBC 15.2*  --   --  15.7*  NEUTROABS 9.8*  --   --   --   HGB 9.4* 7.1* 8.2* 8.4*  HCT 35.2* 21.0* 24.0* 30.0*  MCV 104.8*  --   --  100.7*  PLT 209  --   --  480    Basic Metabolic Panel: Recent Labs  Lab 07/23/21 2027 07/23/21 2113 07/23/21 2200 07/24/21 0641  NA 142 146* 145 142  K 4.5 3.5 4.3 3.8  CL 90*  --   --  95*  CO2 >45*  --   --  35*  GLUCOSE 171*  --   --  109*  BUN 18  --   --  20  CREATININE 0.87  --   --  1.23*  CALCIUM 9.9  --   --  9.5  MG  --   --   --  1.8  PHOS  --   --   --  1.2*   GFR: Estimated Creatinine Clearance: 36.4 mL/min (A) (by C-G formula based on SCr of 1.23 mg/dL (H)). Recent Labs  Lab 07/23/21 2027 07/23/21 2330 07/24/21 0641  WBC 15.2*  --  15.7*  LATICACIDVEN 0.6 2.0*  --     Liver Function Tests: Recent Labs  Lab 07/23/21 2027  AST 12*  ALT 9  ALKPHOS 92  BILITOT 0.6  PROT 6.7  ALBUMIN 2.7*   No results for input(s): LIPASE, AMYLASE in the last 168 hours. Recent Labs  Lab 07/23/21 2330  AMMONIA 82*    ABG    Component Value Date/Time   PHART 7.453 (H) 07/24/2021 0344   PCO2ART 57.1 (H) 07/24/2021 0344   PO2ART 128 (H) 07/24/2021 0344   HCO3 39.4 (H) 07/24/2021 0344   TCO2 >50 (H) 07/23/2021 2200   O2SAT 99.4 07/24/2021 0344     Coagulation Profile: Recent Labs  Lab 07/23/21 2027  INR 0.9    Cardiac  Enzymes: No results for input(s): CKTOTAL, CKMB, CKMBINDEX, TROPONINI in the last 168 hours.  HbA1C: Hgb A1c MFr Bld  Date/Time Value Ref Range Status  07/24/2021 06:41 AM 5.0 4.8 - 5.6 % Final    Comment:    (NOTE) Pre diabetes:          5.7%-6.4%  Diabetes:              >6.4%  Glycemic control for   <7.0% adults with diabetes  CBG: Recent Labs  Lab 07/23/21 2016 07/24/21 0101 07/24/21 0749  GLUCAP 184* 145* 97    Review of Systems:   Unable to obtain due to critical status  Past Medical History:  She,  has a past medical history of Anxiety, Asthma, Basal cell carcinoma, Bruises easily, Cataract, Cervicalgia, COPD (chronic obstructive pulmonary disease) (HCC), Dyspnea, Foot swelling, Frequency of urination, Headache, History of skin cancer, Hypercalcemia, Hypertension, Hypothyroidism, Mixed hyperlipidemia, Pneumonia, Pulmonary nodule, Sleep apnea, Thyroid neoplasm, Urinary incontinence, Wears dentures, and Wears glasses.   Surgical History:   Past Surgical History:  Procedure Laterality Date   BIOPSY  02/17/2021   Procedure: BIOPSY;  Surgeon: Otis Brace, MD;  Location: [facility];  Service: Gastroenterology;;   COLONOSCOPY     DENTAL SURGERY     ELBOW ARTHROPLASTY Right 2020   ESOPHAGOGASTRODUODENOSCOPY (EGD) WITH PROPOFOL N/A 02/17/2021   Procedure: ESOPHAGOGASTRODUODENOSCOPY (EGD) WITH PROPOFOL;  Surgeon: Otis Brace, MD;  Location: [facility];  Service: Gastroenterology;  Laterality: N/A;   EYE SURGERY Right    cataract surgery   FOOT SURGERY  30 YRS AGO   BILATERAL   HARDWARE REMOVAL Right 10/02/2020   Procedure: HARDWARE REMOVAL RIGHT ELBOW;  Surgeon: Shona Needles, MD;  Location: [facility];  Service: Orthopedics;  Laterality: Right;   I & D EXTREMITY Right 01/13/2021   Procedure: IRRIGATION AND DEBRIDEMENT RIGHT ELBOW;  Surgeon: Shona Needles, MD;  Location: [facility];  Service: Orthopedics;  Laterality: Right;   ORIF ELBOW FRACTURE Right  12/06/2017   Procedure: OPEN REDUCTION INTERNAL FIXATION (ORIF) ELBOW/OLECRANON FRACTURE;  Surgeon: Shona Needles, MD;  Location: [facility];  Service: Orthopedics;  Laterality: Right;   SKIN CANCER EXCISION     THYROIDECTOMY N/A 07/31/2014   Procedure: TOTAL THYROIDECTOMY;  Surgeon: Armandina Gemma, MD;  Location: [facility];  Service: General;  Laterality: N/A;   VAGINAL HYSTERECTOMY     VIDEO BRONCHOSCOPY WITH ENDOBRONCHIAL NAVIGATION Bilateral 06/25/2019   Procedure: VIDEO BRONCHOSCOPY WITH ENDOBRONCHIAL NAVIGATION;  Surgeon: Lajuana Matte, MD;  Location: [facility];  Service: Thoracic;  Laterality: Bilateral;     Social History:   reports that she quit smoking about 6 years ago. Her smoking use included cigarettes. She has a 30.00 pack-year smoking history. She has never used smokeless tobacco. She reports current alcohol use. She reports that she does not use drugs.   Family History:  Her family history includes Allergies in her father; Asthma in her father; Coronary artery disease in her father; Hypertension in her father and mother; Lung cancer in her mother.   Allergies Allergies  Allergen Reactions   Amlodipine Besylate Swelling    Edema   Codeine Nausea And Vomiting   Oxycodone-Acetaminophen Nausea And Vomiting     Home Medications  Prior to Admission medications   Medication Sig Start Date End Date Taking? Authorizing Provider  ALPRAZolam Duanne Moron) 0.5 MG tablet Take 0.5 mg by mouth 2 (two) times daily as needed for anxiety. 09/14/20  Yes [provider]  atorvastatin (LIPITOR) 20 MG tablet Take 20 mg by mouth at bedtime.    Yes [provider]  buPROPion (WELLBUTRIN XL) 300 MG 24 hr tablet Take 300 mg by mouth every morning. 07/08/21  Yes [provider]  busPIRone (BUSPAR) 30 MG tablet Take 15 mg by mouth 2 (two) times daily.   Yes [provider]  donepezil (ARICEPT) 10 MG tablet Take 10 mg by mouth at bedtime. 12/17/20  Yes [provider]   Fluticasone-Umeclidin-Vilant 100-62.5-25  MCG/INH AEPB Inhale 1 puff into the lungs daily. Trelegy   Yes [provider]  furosemide (LASIX) 40 MG tablet Take 40 mg by mouth daily. 07/08/21  Yes [provider]  ipratropium-albuterol (DUONEB) 0.5-2.5 (3) MG/3ML SOLN Inhale 3 mLs into the lungs every 4 (four) hours as needed (respiratory issues.). 01/29/20  Yes [provider]  levothyroxine (SYNTHROID) 137 MCG tablet Take 137 mcg by mouth daily before breakfast. 09/22/20  Yes [provider]  metoprolol succinate (TOPROL-XL) 50 MG 24 hr tablet Take 50 mg by mouth daily. Take with or immediately following a meal.   Yes [provider]  mirtazapine (REMERON) 15 MG tablet Take 15 mg by mouth at bedtime. 07/08/21  Yes [provider]  nystatin (MYCOSTATIN) 100000 UNIT/ML suspension Take 5 mLs by mouth 4 (four) times daily. Swish and swallow   Yes [provider]  pantoprazole (PROTONIX) 40 MG tablet Take 1 tablet (40 mg total) by mouth 2 (two) times daily. 02/18/21 07/24/21 Yes Dessa Phi, DO  potassium chloride (KLOR-CON) 10 MEQ tablet Take 10 mEq by mouth 2 (two) times daily. 03/08/21  Yes [provider]  predniSONE (DELTASONE) 20 MG tablet Take 20 mg by mouth daily. 07/08/21  Yes [provider]  VENTOLIN HFA 108 (90 Base) MCG/ACT inhaler INHALE 2 PUFFS INTO THE LUNGS EVERY 4 HOURS AS NEEDED FOR WHEEZING ORSHORTNESS OF BREATH Patient taking differently: Inhale 2 puffs into the lungs every 4 (four) hours as needed for wheezing or shortness of breath. 06/29/17  Yes Parrett, Tammy S, NP  furosemide (LASIX) 20 MG tablet Take 1 tablet (20 mg total) by mouth daily. Patient not taking: Reported on 07/24/2021 03/23/21   Amaryllis Dyke, PA-C     Critical care time:  50 min    The patient is critically ill with multiple organ systems failure and requires high complexity decision making for assessment and support, frequent  evaluation and titration of therapies, application of advanced monitoring technologies and extensive interpretation of multiple databases.    Rodman Pickle, M.D. [facility] 07/24/2021 10:35 AM   Please see Amion for pager number to reach on-call Pulmonary and Critical Care Team.

## 2021-07-24 NOTE — ED Notes (Signed)
Spoke with carelink RN informed drop in BP. Stopped sedation with ongoing bolus. Pending response from carelink MD

## 2021-07-24 NOTE — Procedures (Signed)
Central Venous Catheter Insertion Procedure Note  KENDLE TURBIN  [id number]  08-03-1946  Date:07/24/21  Time:5:09 AM   Provider Performing:Jupiter Kabir D Rollene Rotunda   Procedure: Insertion of Non-tunneled Central Venous [id number]) with US guidance ([id number])   Indication(s) Medication administration  Consent Risks of the procedure as well as the alternatives and risks of each were explained to the patient and/or caregiver.  Consent for the procedure was obtained and is signed in the bedside chart  Anesthesia Topical only with 1% lidocaine   Timeout Verified patient identification, verified procedure, site/side was marked, verified correct patient position, special equipment/implants available, medications/allergies/relevant history reviewed, required imaging and test results available.  Sterile Technique Maximal sterile technique including full sterile barrier drape, hand hygiene, sterile gown, sterile gloves, mask, hair covering, sterile ultrasound probe cover (if used).  Procedure Description Area of catheter insertion was cleaned with chlorhexidine and draped in sterile fashion.  With real-time ultrasound guidance a central venous catheter was placed into the left internal jugular vein. Nonpulsatile blood flow and easy flushing noted in all ports.  The catheter was sutured in place and sterile dressing applied.     Complications/Tolerance None; patient tolerated the procedure well. Chest X-ray is ordered to verify placement for internal jugular or subclavian cannulation.   Chest x-ray is not ordered for femoral cannulation.  EBL Minimal  Specimen(s) None  JD Rexene Agent [facility] 07/24/2021, 5:09 AM  Please see [email or web address] for pager details.  From 7A-7P if no response, please call [phone]. After hours, please call ELink [phone].

## 2021-07-24 NOTE — Progress Notes (Signed)
Transport pt to CT and back. No noted respiratory issues at this time.

## 2021-07-24 NOTE — Progress Notes (Signed)
[facility] Progress Note Patient Name: Holly [name] DOB: Jun 10, 1946 MRN: [id number]   Date of Service  07/24/2021  HPI/Events of Note  Agitation - Nursing request to renew restraint orders.   eICU Interventions  Will renew bilateral soft wrist restraints X 12 hours.     Intervention Category Major Interventions: Delirium, psychosis, severe agitation - evaluation and management  [name] 07/24/2021, 8:10 PM

## 2021-07-25 ENCOUNTER — Inpatient Hospital Stay (HOSPITAL_COMMUNITY): Payer: Medicare Other

## 2021-07-25 DIAGNOSIS — N179 Acute kidney failure, unspecified: Secondary | ICD-10-CM

## 2021-07-25 DIAGNOSIS — J9622 Acute and chronic respiratory failure with hypercapnia: Secondary | ICD-10-CM | POA: Diagnosis not present

## 2021-07-25 DIAGNOSIS — G9341 Metabolic encephalopathy: Secondary | ICD-10-CM | POA: Diagnosis not present

## 2021-07-25 DIAGNOSIS — J9621 Acute and chronic respiratory failure with hypoxia: Secondary | ICD-10-CM

## 2021-07-25 DIAGNOSIS — R4182 Altered mental status, unspecified: Secondary | ICD-10-CM

## 2021-07-25 DIAGNOSIS — R6521 Severe sepsis with septic shock: Secondary | ICD-10-CM

## 2021-07-25 LAB — CBC
HCT: 24.1 % — ABNORMAL LOW (ref 36.0–46.0)
HCT: 27 % — ABNORMAL LOW (ref 36.0–46.0)
HCT: 27.4 % — ABNORMAL LOW (ref 36.0–46.0)
Hemoglobin: 6.9 g/dL — CL (ref 12.0–15.0)
Hemoglobin: 7.8 g/dL — ABNORMAL LOW (ref 12.0–15.0)
Hemoglobin: 7.9 g/dL — ABNORMAL LOW (ref 12.0–15.0)
MCH: 28.3 pg (ref 26.0–34.0)
MCH: 27.9 pg (ref 26.0–34.0)
MCH: 27.8 pg (ref 26.0–34.0)
MCHC: 28.6 g/dL — ABNORMAL LOW (ref 30.0–36.0)
MCHC: 28.9 g/dL — ABNORMAL LOW (ref 30.0–36.0)
MCHC: 28.8 g/dL — ABNORMAL LOW (ref 30.0–36.0)
MCV: 98.8 fL (ref 80.0–100.0)
MCV: 96.4 fL (ref 80.0–100.0)
MCV: 96.5 fL (ref 80.0–100.0)
Platelets: 151 10*3/uL (ref 150–400)
Platelets: 134 10*3/uL — ABNORMAL LOW (ref 150–400)
Platelets: 136 10*3/uL — ABNORMAL LOW (ref 150–400)
RBC: 2.44 MIL/uL — ABNORMAL LOW (ref 3.87–5.11)
RBC: 2.8 MIL/uL — ABNORMAL LOW (ref 3.87–5.11)
RBC: 2.84 MIL/uL — ABNORMAL LOW (ref 3.87–5.11)
RDW: 16.4 % — ABNORMAL HIGH (ref 11.5–15.5)
RDW: 17.5 % — ABNORMAL HIGH (ref 11.5–15.5)
RDW: 17.6 % — ABNORMAL HIGH (ref 11.5–15.5)
WBC: 10.7 10*3/uL — ABNORMAL HIGH (ref 4.0–10.5)
WBC: 10.1 10*3/uL (ref 4.0–10.5)
WBC: 10.2 10*3/uL (ref 4.0–10.5)
nRBC: 0 % (ref 0.0–0.2)
nRBC: 0.2 % (ref 0.0–0.2)
nRBC: 0 % (ref 0.0–0.2)

## 2021-07-25 LAB — GLUCOSE, CAPILLARY
Glucose-Capillary: 209 mg/dL — ABNORMAL HIGH (ref 70–99)
Glucose-Capillary: 201 mg/dL — ABNORMAL HIGH (ref 70–99)
Glucose-Capillary: 147 mg/dL — ABNORMAL HIGH (ref 70–99)
Glucose-Capillary: 203 mg/dL — ABNORMAL HIGH (ref 70–99)
Glucose-Capillary: 210 mg/dL — ABNORMAL HIGH (ref 70–99)
Glucose-Capillary: 158 mg/dL — ABNORMAL HIGH (ref 70–99)

## 2021-07-25 LAB — COMPREHENSIVE METABOLIC PANEL
ALT: 12 U/L (ref 0–44)
AST: 20 U/L (ref 15–41)
Albumin: 2.1 g/dL — ABNORMAL LOW (ref 3.5–5.0)
Alkaline Phosphatase: 59 U/L (ref 38–126)
Anion gap: 9 (ref 5–15)
BUN: 24 mg/dL — ABNORMAL HIGH (ref 8–23)
CO2: 34 mmol/L — ABNORMAL HIGH (ref 22–32)
Calcium: 9.1 mg/dL (ref 8.9–10.3)
Chloride: 98 mmol/L (ref 98–111)
Creatinine, Ser: 1.02 mg/dL — ABNORMAL HIGH (ref 0.44–1.00)
GFR, Estimated: 57 mL/min — ABNORMAL LOW (ref >60)
Glucose, Bld: 189 mg/dL — ABNORMAL HIGH (ref 70–99)
Potassium: 2.9 mmol/L — ABNORMAL LOW (ref 3.5–5.1)
Sodium: 141 mmol/L (ref 135–145)
Total Bilirubin: 0.7 mg/dL (ref 0.3–1.2)
Total Protein: 5.4 g/dL — ABNORMAL LOW (ref 6.5–8.1)

## 2021-07-25 LAB — PHOSPHORUS
Phosphorus: 4.4 mg/dL (ref 2.5–4.6)
Phosphorus: 3.3 mg/dL (ref 2.5–4.6)

## 2021-07-25 LAB — ECHOCARDIOGRAM COMPLETE
Area-P 1/2: 3.11 cm2
Height: 64 in
S' Lateral: 3.6 cm
Weight: 2433.88 oz

## 2021-07-25 LAB — TRIGLYCERIDES: Triglycerides: 137 mg/dL (ref <150)

## 2021-07-25 LAB — MAGNESIUM
Magnesium: 2.5 mg/dL — ABNORMAL HIGH (ref 1.7–2.4)
Magnesium: 2.4 mg/dL (ref 1.7–2.4)

## 2021-07-25 LAB — PREPARE RBC (CROSSMATCH)

## 2021-07-25 MED ORDER — SODIUM CHLORIDE 0.9% IV SOLUTION: Freq: Once | INTRAVENOUS | Status: AC

## 2021-07-25 MED ORDER — POTASSIUM CHLORIDE 10 MEQ/50ML IV SOLN
10.0000 meq | INTRAVENOUS | Status: AC
  Administered 2021-07-25 (×4): 10 meq via INTRAVENOUS
  Filled 2021-07-25 (×4): qty 50

## 2021-07-25 MED ORDER — POTASSIUM CHLORIDE CRYS ER 20 MEQ PO TBCR
40.0000 meq | EXTENDED_RELEASE_TABLET | Freq: Once | ORAL | Status: AC
  Administered 2021-07-25: 40 meq via ORAL
  Filled 2021-07-25: qty 2

## 2021-07-25 MED ORDER — INSULIN ASPART 100 UNIT/ML IJ SOLN
0.0000 [IU] | INTRAMUSCULAR | Status: DC
  Administered 2021-07-25 (×3): 3 [IU] via SUBCUTANEOUS
  Administered 2021-07-25: 1 [IU] via SUBCUTANEOUS
  Administered 2021-07-26: 2 [IU] via SUBCUTANEOUS
  Administered 2021-07-26: 1 [IU] via SUBCUTANEOUS
  Administered 2021-07-26 (×3): 2 [IU] via SUBCUTANEOUS
  Administered 2021-07-27 – 2021-07-28 (×5): 1 [IU] via SUBCUTANEOUS

## 2021-07-25 MED ORDER — PANTOPRAZOLE SODIUM 40 MG IV SOLR
40.0000 mg | Freq: Two times a day (BID) | INTRAVENOUS | Status: DC
  Administered 2021-07-25 – 2021-07-26 (×3): 40 mg via INTRAVENOUS
  Filled 2021-07-25 (×4): qty 40

## 2021-07-25 MED ORDER — LACTATED RINGERS IV BOLUS
500.0000 mL | Freq: Once | INTRAVENOUS | Status: AC
  Administered 2021-07-25: 500 mL via INTRAVENOUS

## 2021-07-25 NOTE — Progress Notes (Signed)
eLink Physician-Brief Progress Note Patient Name: Holly [name] DOB: [birth year] MRN: [id number]   Date of Service  07/25/2021  HPI/Events of Note  Anemia - Hgb = 6.9.  eICU Interventions  Will transfuse 1 unit PRBC now.     Intervention Category Major Interventions: Other:  Liara Holm Cornelia Copa 07/25/2021, 5:12 AM

## 2021-07-25 NOTE — Progress Notes (Signed)
eLink Physician-Brief Progress Note Patient Name: Holly [name] DOB: Feb 24, 1946 MRN: [id number]   Date of Service  07/25/2021  HPI/Events of Note  Multiple liquid stools - Nursing request for Flexiseal.   eICU Interventions  Will place Flexiseal.      Intervention Category Major Interventions: Other:  Lysle Dingwall 07/25/2021, 6:44 AM

## 2021-07-25 NOTE — Procedures (Signed)
Patient Name: Holly [name]  MRN: [id number]  Epilepsy Attending: Lora Havens  Referring Physician/Provider: Dr Margaretha Seeds Date: 07/25/2021 Duration: 24.46 mins  Patient history: 75yo F with ams. EEG to evaluate for seizure  Level of alertness:  lethargic   AEDs during EEG study: propofol  Technical aspects: This EEG study was done with scalp electrodes positioned according to the 10-20 International system of electrode placement. Electrical activity was acquired at a sampling rate of 500Hz  and reviewed with a high frequency filter of 70Hz  and a low frequency filter of 1Hz . EEG data were recorded continuously and digitally stored.   Description:  EEG showed continuous generalized polymorphic sharply contoured 3 to 6 Hz theta-delta slowing. Generalized periodic discharges with triphasic morphology at 0.5-1Hz  were also noted.  Hyperventilation and photic stimulation were not performed.      ABNORMALITY - Periodic discharges with triphasic morphology, generalized ( GPDs) - Continuous slow, generalized   IMPRESSION: This study showed generalized periodic discharges with triphasic morphology at 0.5-1 Hz which is on the ictal-interictal continuum with low potential for seizures. This eeg pattern can also been seen due toxic-metabolic causes. Additionally there is moderate to severe diffuse encephalopathy, nonspecific etiology. No seizures were seen throughout the recording.  Delvonte Berenson Barbra Sarks

## 2021-07-25 NOTE — Progress Notes (Signed)
EEG complete - results pending 

## 2021-07-25 NOTE — Progress Notes (Signed)
NAME:  Holly [name], MRN:  [id number], DOB:  04-23-1946, LOS: 2 ADMISSION DATE:  07/23/2021, CONSULTATION DATE:  07/23/21 REFERRING MD:   CHIEF COMPLAINT:   Unresponsiveness  History of Present Illness:  75 year old lady with PMH significant for COPD on home Oxygen and chronic steroid therapy, lung cancer diagnosed 5 years ago on intermittent palliative XRT, hypothyroidism among other conditions.  The patient was brought to the ED because of unresponsiveness.  The patient's daughter with whom she lives with, reported that her mother was alone on the day of admission because the companion that stays with her while the daughter is at work was not available.  The daughter called to check on her mother at lunchtime and her mother did not answer the phone.  Hence the daughter asked a friend to check in on her Mum.  The friend was able to assist the mother with her meal and into the lift recliner.   The daughter arrived home around 7pm and found her mother unresponsive so EMS was called.  Upon arrival to the ED, the patient had a GCS of 3 so she was intubated. CT head was negative but a venous blood gas found the patient to have a PCO2 of 95.6.  CXR did not show any acute pathology and her glucose was normal.  The patient was therefor referred for ICU admission.   Pertinent  Medical History  COPD, chronic hypoxemic respiratory failure, lung cancer diagnosed 5 years ago on intermittent palliative XRT, hypothyroidism, OSA not on CPAP  Significant Hospital Events: Including procedures, antibiotic start and stop dates in addition to other pertinent events   10/7 Admitted for unresponsiveness.  10/8 Hypotensive overnight and required vasopressor support. Unclear source of infxn 10/9 Transfused 1 PRBC  Interim History / Subjective:  Hg dropped 6.9. Transfused 1 PRBC  Objective   Blood pressure (!) 118/45, pulse 74, temperature 100 F (37.8 C), resp. rate 20, height 5\' 4"  (1.626 m), weight 69 kg, SpO2 94  %.    Vent Mode: PRVC FiO2 (%):  [40 %] 40 % Set Rate:  [20 bmp] 20 bmp Vt Set:  [440 mL] 440 mL PEEP:  [5 cmH20] 5 cmH20 Plateau Pressure:  [16 cmH20-20 cmH20] 17 cmH20   Intake/Output Summary (Last 24 hours) at 07/25/2021 0929 Last data filed at 07/25/2021 0900 Gross per 24 hour  Intake 4272.42 ml  Output 780 ml  Net 3492.42 ml   Filed Weights   07/23/21 2042 07/24/21 0500 07/25/21 0405  Weight: 74.8 kg 66.1 kg 69 kg   Physical Exam: General: Chronically ill, elderly-appearing, no acute distress HENT: [place], AT, ETT I nplace Eyes: EOMI, no scleral icterus Respiratory: Diminished breath sounds bilaterally.  No crackles, wheezing or rales Cardiovascular: RRR, -M/R/G, no JVD GI: BS+, soft, nontender Extremities:-Edema,-tenderness Neuro: Sedated  Physical Exam: General: Critically ill-appearing, no acute distress HENT: [place], AT, ETT in place Eyes: EOMI, no scleral icterus Respiratory: Diminished breath sounds bilaterally.  No crackles, wheezing or rales Cardiovascular: RRR, -M/R/G, no JVD GI: BS+, soft, nontender Extremities:-Edema,-tenderness Neuro: Sedated, does not follow commands GU: Foley in place   Resolved Hospital Problem list     Assessment & Plan:   Acute on chronic hypoxic and hypercarbic respiratory failure now on mechanical ventilation. HX of COPD on continuous home Oxygen 3 liters and chronic prednisone OSA not on CPAP Plan:  --LTVV. wean FIO2 for sats. 88% per protocol.  --Vent wean and extubate per protocol. 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 Patient was still under induction medication when  examined in the ED. 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 No evidence of active GI bleed on this admission. 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 Plan:  --MRSA screen neg this admission  Best Practice (right click and "Reselect all SmartList Selections" daily)   Diet/type: tubefeeds DVT prophylaxis: SCD GI prophylaxis: PPI Lines: Central line Foley:  Yes, and it is still needed Code Status:  full code Last date of multidisciplinary goals of care discussion [ 10/8] Full code per daughter  Labs   CBC: Recent Labs  Lab 07/23/21 2027 07/23/21 2113 07/23/21 2200 07/24/21 0641 07/25/21 0429  WBC 15.2*  --   --  15.7* 10.7*  NEUTROABS 9.8*  --   --   --   --   HGB 9.4* 7.1* 8.2* 8.4* 6.9*  HCT 35.2* 21.0* 24.0* 30.0* 24.1*  MCV 104.8*  --   --  100.7* 98.8  PLT 209  --   --  201 254    Basic Metabolic Panel: Recent Labs  Lab 07/23/21 2027 07/23/21 2113 07/23/21 2200 07/24/21 0641 07/24/21 2036 07/25/21 0429  NA 142 146* 145 142  --  141  K 4.5 3.5 4.3 3.8  --  2.9*  CL 90*  --   --  95*  --  98  CO2 >45*  --   --  35*  --  34*  GLUCOSE 171*  --   --  109*  --  189*  BUN 18  --   --  20  --  24*  CREATININE 0.87  --   --  1.23*  --   1.02*  CALCIUM 9.9  --   --  9.5  --  9.1  MG  --   --   --  1.8 1.7 2.5*  PHOS  --   --   --  1.2* 2.0* 4.4   GFR: Estimated Creatinine Clearance: 44.7 mL/min (A) (by C-G formula based on SCr of 1.02 mg/dL (H)). Recent Labs  Lab 07/23/21 2027 07/23/21 2330 07/24/21 0641 07/24/21 1158 07/25/21 0429  WBC 15.2*  --  15.7*  --  10.7*  LATICACIDVEN 0.6 2.0*  --  1.7  --     Liver Function Tests: Recent Labs  Lab 07/23/21 2027 07/25/21 0429  AST 12* 20  ALT 9 12  ALKPHOS 92 59  BILITOT 0.6 0.7  PROT 6.7 5.4*  ALBUMIN  2.7* 2.1*   No results for input(s): LIPASE, AMYLASE in the last 168 hours. Recent Labs  Lab 07/23/21 2330  AMMONIA 82*    ABG    Component Value Date/Time   PHART 7.453 (H) 07/24/2021 0344   PCO2ART 57.1 (H) 07/24/2021 0344   PO2ART 128 (H) 07/24/2021 0344   HCO3 39.4 (H) 07/24/2021 0344   TCO2 >50 (H) 07/23/2021 2200   O2SAT 99.4 07/24/2021 0344     Coagulation Profile: Recent Labs  Lab 07/23/21 2027  INR 0.9    Cardiac Enzymes: No results for input(s): CKTOTAL, CKMB, CKMBINDEX, TROPONINI in the last 168 hours.  HbA1C: Hgb A1c MFr Bld  Date/Time Value Ref Range Status  07/24/2021 06:41 AM 5.0 4.8 - 5.6 % Final    Comment:    (NOTE) Pre diabetes:          5.7%-6.4%  Diabetes:              >6.4%  Glycemic control for   <7.0% adults with diabetes     CBG: Recent Labs  Lab 07/24/21 1545 07/24/21 1942 07/24/21 2323 07/25/21 0325 07/25/21 0745  GLUCAP 137* 160* 179* 209* 201*    Critical care time:  41 min    The patient is critically ill with multiple organ systems failure and requires high complexity decision making for assessment and support, frequent evaluation and titration of therapies, application of advanced monitoring technologies and extensive interpretation of multiple databases.    Rodman Pickle, M.D. [facility] Pulmonary/Critical Care Medicine 07/25/2021 9:29 AM   Please see Amion for pager number to reach  on-call Pulmonary and Critical Care Team.

## 2021-07-25 NOTE — Consult Note (Signed)
[facility] Consult  Referring Provider: Darlis Loan Ellison,MD/critical care Primary Care Physician:  [name] Stains, MD Primary Gastroenterologist: Dr. Fabio Pierce GI  Reason for Consultation: Drop in hemoglobin  HPI: Holly [name] is a 75 y.o. female was admitted on 07/23/2021 with unresponsiveness. Most of the history is obtained from the patient's daughter over the phone, [facility], [phone].  She left her mom Friday morning, had a friend check on her Friday afternoon, on returning home Friday evening she found her mother unresponsive and called EMS, who brought the patient to the ED where patient was subsequently intubated, found to have hypercarbia.  Patient came in with a hemoglobin of 9.4 which dropped to 7.1, went up to 8.2, 8.4 without transfusion, dropped down to 6.9 today morning, subsequently she received 1 unit PRBC transfusion and has stabilized at 7.8.  Patient is receiving NG tube feeding, there has been no evidence of hematemesis or coffee-ground emesis. She has a rectal tube draining liquid brown, light tan stool.  Patient had an endoscopy in May 2022 which showed superficial gastric ulcers and esophagitis, biopsies negative for H. Pylori. Subsequently a repeat endoscopy and an outpatient colonoscopy was being planned with Dr. Alessandra Bevels for August 17, 2021 at [facility].  Patient had an EGD in 2013. Colonoscopy in 2013 showed removal of a tubular adenoma from ascending colon.   Past Medical History:  Diagnosis Date   Anxiety    Asthma    Basal cell carcinoma    lung cancer.  Skin cancer- basal - nose removed   Bruises easily    Cataract    Cervicalgia    COPD (chronic obstructive pulmonary disease) (HCC)    emphysema and COPD   Dyspnea    uses O2 only when needed. Uses 2 L    Foot swelling    left- has shown to Drs.   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   Hypercalcemia    Hypertension     Hypothyroidism    Mixed hyperlipidemia    Pneumonia    Pulmonary nodule    Sleep apnea    does not wear cpap   Thyroid neoplasm    Urinary incontinence    Wears dentures    Wears glasses     Past Surgical History:  Procedure Laterality Date   BIOPSY  02/17/2021   Procedure: BIOPSY;  Surgeon: Otis Brace, MD;  Location: [place] [facility];  Service: Gastroenterology;;   COLONOSCOPY     DENTAL SURGERY     ELBOW ARTHROPLASTY Right 2020   ESOPHAGOGASTRODUODENOSCOPY (EGD) WITH PROPOFOL N/A 02/17/2021   Procedure: ESOPHAGOGASTRODUODENOSCOPY (EGD) WITH PROPOFOL;  Surgeon: Otis Brace, MD;  Location: [place] [facility];  Service: Gastroenterology;  Laterality: N/A;   EYE SURGERY Right    cataract surgery   FOOT SURGERY  30 YRS AGO   BILATERAL   HARDWARE REMOVAL Right 10/02/2020   Procedure: HARDWARE REMOVAL RIGHT ELBOW;  Surgeon: Shona Needles, MD;  Location: [place];  Service: Orthopedics;  Laterality: Right;   I & D EXTREMITY Right 01/13/2021   Procedure: IRRIGATION AND DEBRIDEMENT RIGHT ELBOW;  Surgeon: Shona Needles, MD;  Location: [place];  Service: Orthopedics;  Laterality: Right;   ORIF ELBOW FRACTURE Right 12/06/2017   Procedure: OPEN REDUCTION INTERNAL FIXATION (ORIF) ELBOW/OLECRANON FRACTURE;  Surgeon: Shona Needles, MD;  Location: [place];  Service: Orthopedics;  Laterality: Right;   SKIN CANCER EXCISION  THYROIDECTOMY N/A 07/31/2014   Procedure: TOTAL THYROIDECTOMY;  Surgeon: Armandina Gemma, MD;  Location: [facility];  Service: General;  Laterality: N/A;   VAGINAL HYSTERECTOMY     VIDEO BRONCHOSCOPY WITH ENDOBRONCHIAL NAVIGATION Bilateral 06/25/2019   Procedure: VIDEO BRONCHOSCOPY WITH ENDOBRONCHIAL NAVIGATION;  Surgeon: Lajuana Matte, MD;  Location: [facility];  Service: Thoracic;  Laterality: Bilateral;    Prior to Admission medications   Medication Sig Start Date End Date Taking? Authorizing Provider  ALPRAZolam Duanne Moron) 0.5 MG tablet Take 0.5 mg by mouth 2 (two) times daily  as needed for anxiety. 09/14/20  Yes [provider]  atorvastatin (LIPITOR) 20 MG tablet Take 20 mg by mouth at bedtime.    Yes [provider]  buPROPion (WELLBUTRIN XL) 300 MG 24 hr tablet Take 300 mg by mouth every morning. 07/08/21  Yes [provider]  busPIRone (BUSPAR) 30 MG tablet Take 15 mg by mouth 2 (two) times daily.   Yes [provider]  donepezil (ARICEPT) 10 MG tablet Take 10 mg by mouth at bedtime. 12/17/20  Yes [provider]  Fluticasone-Umeclidin-Vilant 100-62.5-25 MCG/INH AEPB Inhale 1 puff into the lungs daily. Trelegy   Yes [provider]  furosemide (LASIX) 40 MG tablet Take 40 mg by mouth daily. 07/08/21  Yes [provider]  ipratropium-albuterol (DUONEB) 0.5-2.5 (3) MG/3ML SOLN Inhale 3 mLs into the lungs every 4 (four) hours as needed (respiratory issues.). 01/29/20  Yes [provider]  levothyroxine (SYNTHROID) 137 MCG tablet Take 137 mcg by mouth daily before breakfast. 09/22/20  Yes [provider]  metoprolol succinate (TOPROL-XL) 50 MG 24 hr tablet Take 50 mg by mouth daily. Take with or immediately following a meal.   Yes [provider]  mirtazapine (REMERON) 15 MG tablet Take 15 mg by mouth at bedtime. 07/08/21  Yes [provider]  nystatin (MYCOSTATIN) 100000 UNIT/ML suspension Take 5 mLs by mouth 4 (four) times daily. Swish and swallow   Yes [provider]  pantoprazole (PROTONIX) 40 MG tablet Take 1 tablet (40 mg total) by mouth 2 (two) times daily. 02/18/21 07/24/21 Yes Dessa Phi, DO  potassium chloride (KLOR-CON) 10 MEQ tablet Take 10 mEq by mouth 2 (two) times daily. 03/08/21  Yes [provider]  predniSONE (DELTASONE) 20 MG tablet Take 20 mg by mouth daily. 07/08/21  Yes [provider]  VENTOLIN HFA 108 (90 Base) MCG/ACT inhaler INHALE 2 PUFFS INTO THE LUNGS EVERY 4 HOURS AS NEEDED FOR WHEEZING ORSHORTNESS OF BREATH Patient taking  differently: Inhale 2 puffs into the lungs every 4 (four) hours as needed for wheezing or shortness of breath. 06/29/17  Yes Parrett, Tammy S, NP  furosemide (LASIX) 20 MG tablet Take 1 tablet (20 mg total) by mouth daily. Patient not taking: Reported on 07/24/2021 03/23/21   Petrucelli, Glynda Jaeger, PA-C    Current Facility-Administered Medications  Medication Dose Route Frequency Provider Last Rate Last Admin   0.9 %  sodium chloride infusion  250 mL Intravenous Continuous Anders Simmonds, MD   Stopped at 07/25/21 0054   ALPRAZolam Duanne Moron) tablet 0.5 mg  0.5 mg Per Tube BID PRN Margaretha Seeds, MD   0.5 mg at 07/25/21 1056   atorvastatin (LIPITOR) tablet 20 mg  20 mg Per NG tube Daily Lafayette Dragon, MD   20 mg at 07/25/21 1056   ceFEPIme (MAXIPIME) 2 g in sodium chloride 0.9 % 100 mL IVPB  2 g Intravenous Q12H Lafayette Dragon, MD  200 mL/hr at 07/25/21 1107 2 g at 07/25/21 1107   chlorhexidine gluconate (MEDLINE KIT) (PERIDEX) 0.12 % solution 15 mL  15 mL Mouth Rinse BID Mick Sell, PA-C   15 mL at 07/25/21 0809   Chlorhexidine Gluconate Cloth 2 % PADS 6 each  6 each Topical Daily Margaretha Seeds, MD   6 each at 07/25/21 1337   dexmedetomidine (PRECEDEX) 400 MCG/100ML (4 mcg/mL) infusion  0-1.2 mcg/kg/hr Intravenous Continuous Lafayette Dragon, MD   Stopped at 07/24/21 0057   docusate (COLACE) 50 MG/5ML liquid 100 mg  100 mg Per Tube BID PRN Lafayette Dragon, MD       docusate (COLACE) 50 MG/5ML liquid 100 mg  100 mg Per Tube BID Margaretha Seeds, MD   100 mg at 07/25/21 1114   feeding supplement (PROSource TF) liquid 45 mL  45 mL Per Tube BID Lafayette Dragon, MD   45 mL at 07/25/21 1056   feeding supplement (VITAL AF 1.2 CAL) liquid 1,000 mL  1,000 mL Per Tube Continuous Lafayette Dragon, MD 50 mL/hr at 07/24/21 1826 1,000 mL at 07/24/21 1826   fentaNYL (SUBLIMAZE) bolus via infusion 25 mcg  25 mcg Intravenous Q1H PRN Curatolo, Adam, DO   25 mcg at 07/25/21 1052   fentaNYL 2521mg  in NS 2579m(1065mml) infusion-PREMIX  25-400 mcg/hr Intravenous Continuous Curatolo, Adam, DO 20 mL/hr at 07/25/21 1052 200 mcg/hr at 07/25/21 1052   hydrocortisone sodium succinate (SOLU-CORTEF) 100 MG injection 100 mg  100 mg Intravenous Q8H EllMargaretha SeedsD   100 mg at 07/25/21 1206   insulin aspart (novoLOG) injection 0-9 Units  0-9 Units Subcutaneous Q4H EllMargaretha SeedsD   1 Units at 07/25/21 1203   ipratropium-albuterol (DUONEB) 0.5-2.5 (3) MG/3ML nebulizer solution 3 mL  3 mL Nebulization Q6H RicLafayette DragonD   3 mL at 07/25/21 0759   levothyroxine (SYNTHROID) tablet 137 mcg  137 mcg Per Tube Q0600 RicLafayette DragonD   137 mcg at 07/25/21 0549   MEDLINE mouth rinse  15 mL Mouth Rinse 10 times per day PayAndres Labrum PA-C   15 mL at 07/25/21 1337   midazolam (VERSED) injection 2 mg  2 mg Intravenous Q15 min PRN EllMargaretha SeedsD       midazolam (VERSED) injection 2 mg  2 mg Intravenous Q2H PRN EllMargaretha SeedsD       norepinephrine (LEVOPHED) 4mg60m 250mL30mmix infusion  0-40 mcg/min Intravenous Titrated EllisMargaretha Seeds11.25 mL/hr at 07/25/21 0903 3 mcg/min at 07/25/21 0903   pantoprazole (PROTONIX) injection 40 mg  40 mg Intravenous Q12H KarkiRonnette Juniper      polyethylene glycol (MIRALAX / GLYCOLAX) packet 17 g  17 g Per Tube Daily PRN RichaLafayette Dragon      polyethylene glycol (MIRALAX / GLYCOLAX) packet 17 g  17 g Per Tube Daily EllisMargaretha Seeds  17 g at 07/25/21 1056   potassium chloride 10 mEq in 50 mL *CENTRAL LINE* IVPB  10 mEq Intravenous Q1 Hr x 4 EllisMargaretha Seeds50 mL/hr at 07/25/21 1309 10 mEq at 07/25/21 1309   propofol (DIPRIVAN) 1000 MG/100ML infusion  0-50 mcg/kg/min Intravenous Continuous EllisMargaretha Seeds3.97 mL/hr at 07/25/21 1053 10 mcg/kg/min at 07/25/21 1053   sodium chloride 0.9 % bolus 1,000 mL  1,000 mL Intravenous Once SommeAnders Simmonds  sodium chloride flush (NS) 0.9 % injection 10-40 mL  10-40 mL  Intracatheter Q12H Lafayette Dragon, MD   10 mL at 07/25/21 0815   sodium chloride flush (NS) 0.9 % injection 10-40 mL  10-40 mL Intracatheter PRN Lafayette Dragon, MD       vancomycin (VANCOREADY) IVPB 750 mg/150 mL  750 mg Intravenous Q24H Margaretha Seeds, MD 150 mL/hr at 07/25/21 1215 750 mg at 07/25/21 1215    Allergies as of 07/23/2021 - Review Complete 03/23/2021  Allergen Reaction Noted   Amlodipine besylate Other (See Comments) 11/17/2020   Codeine Nausea And Vomiting 03/04/2021   Oxycodone-acetaminophen Nausea And Vomiting 12/06/2017    Family History  Problem Relation Age of Onset   Coronary artery disease Father    Hypertension Father    Asthma Father    Allergies Father    Hypertension Mother    Lung cancer Mother     Social History   Socioeconomic History   Marital status: Widowed    Spouse name: Not on file   Number of children: 2   Years of education: Not on file   Highest education level: Not on file  Occupational History   Occupation: [employer]  Tobacco Use   Smoking status: Former    Packs/day: 1.00    Years: 30.00    Pack years: 30.00    Types: Cigarettes    Quit date: 02/24/2015    Years since quitting: 6.4   Smokeless tobacco: Never  Vaping Use   Vaping Use: Former  Substance and Sexual Activity   Alcohol use: Yes    Alcohol/week: 0.0 standard drinks    Comment: very seldom   Drug use: Never   Sexual activity: Not on file  Other Topics Concern   Not on file  Social History Narrative   Not on file   Social Determinants of Health   Financial Resource Strain: Not on file  Food Insecurity: Not on file  Transportation Needs: Not on file  Physical Activity: Not on file  Stress: Not on file  Social Connections: Not on file  Intimate Partner Violence: Not on file    Review of Systems: Not available as patient is intubated  Physical Exam: Vital signs in last 24 hours: Temp:  [99.7 F (37.6 C)-100.6 F (38.1 C)] 100 F (37.8  C) (10/09 0915) Pulse Rate:  [56-117] 74 (10/09 0915) Resp:  [13-25] 20 (10/09 0915) BP: (77-157)/(41-104) 118/45 (10/09 0915) SpO2:  [88 %-98 %] 94 % (10/09 0915) FiO2 (%):  [40 %] 40 % (10/09 0815) Weight:  [16 kg] 69 kg (10/09 0405) Last BM Date: 07/24/21  General:   Intubated, sedated Head:  Normocephalic and atraumatic. Eyes:  Sclera clear, no icterus.   Mild pallor Ears:  Normal auditory acuity. Nose:  No deformity, discharge,  or lesions. Mouth:  No deformity or lesions.  Oropharynx pink & moist. Neck:  Supple; no masses or thyromegaly. Lungs:  Clear throughout to auscultation.   No wheezes, crackles, or rhonchi. No acute distress. Heart:  Regular rate and rhythm; no murmurs, clicks, rubs,  or gallops. Extremities:  Without clubbing or edema. Neurologic: Intubated and sedated Skin:  Intact without significant lesions or rashes. Psych:  Alert and cooperative. Normal mood and affect. Abdomen:  Soft, nontender and nondistended. No masses, hepatosplenomegaly or hernias noted. Normal bowel sounds, without guarding, and without rebound.    Rectal tube draining light yellow/brown stool     Lab Results: Recent Labs  07/24/21 0641 07/25/21 0429 07/25/21 0926  WBC 15.7* 10.7* 10.1  HGB 8.4* 6.9* 7.8*  HCT 30.0* 24.1* 27.0*  PLT 201 151 134*   BMET Recent Labs    07/23/21 2027 07/23/21 2113 07/23/21 2200 07/24/21 0641 07/25/21 0429  NA 142   < > 145 142 141  K 4.5   < > 4.3 3.8 2.9*  CL 90*  --   --  95* 98  CO2 >45*  --   --  35* 34*  GLUCOSE 171*  --   --  109* 189*  BUN 18  --   --  20 24*  CREATININE 0.87  --   --  1.23* 1.02*  CALCIUM 9.9  --   --  9.5 9.1   < > = values in this interval not displayed.   LFT Recent Labs    07/25/21 0429  PROT 5.4*  ALBUMIN 2.1*  AST 20  ALT 12  ALKPHOS 59  BILITOT 0.7   PT/INR Recent Labs    07/23/21 2027  LABPROT 12.4  INR 0.9    Studies/Results: CT HEAD WO CONTRAST (5MM)  Result Date:  07/24/2021 CLINICAL DATA:  Mental status change, persistent or worsening. EXAM: CT HEAD WITHOUT CONTRAST TECHNIQUE: Contiguous axial images were obtained from the base of the skull through the vertex without intravenous contrast. COMPARISON:  Head CT 07/23/2021.  Brain MRI 10/05/2020. FINDINGS: Brain: Mild generalized cerebral and cerebellar atrophy. Redemonstrated 8 mm rounded dural-based calcified focus overlying the right frontal lobe, suspicious for an incidental meningioma (series 5, image 23). Advanced patchy and confluent hypoattenuation within the cerebral white matter, nonspecific but compatible chronic small vessel ischemic disease. Gray-white differentiation is preserved. No evidence of acute cortical infarct. There is no acute intracranial hemorrhage. No extra-axial fluid collection. No midline shift. Vascular: No hyperdense vessel or unexpected calcification. Skull: No calvarial fracture or focal suspicious osseous lesion. Sinuses/Orbits: Visualized orbits show no acute finding. Trace mucosal thickening within the bilateral ethmoid and sphenoid sinuses. Other: Minimal fluid within the bilateral mastoid air cells. IMPRESSION: No evidence of acute intracranial hemorrhage or acute infarction. No CT evidence of acute hypoxic/ischemic injury. Redemonstrated 8 mm rounded dural-based calcified focus overlying the right frontal lobe, suspicious for an incidental meningioma. Severe chronic small vessel ischemic changes within the cerebral white matter. Mild generalized parenchymal atrophy. Minimal paranasal sinus mucosal thickening, as described. Trace fluid within the bilateral mastoid air cells. Electronically Signed   By: Kellie Simmering D.O.   On: 07/24/2021 13:11   CT HEAD WO CONTRAST (5MM)  Result Date: 07/23/2021 CLINICAL DATA:  Mental status change EXAM: CT HEAD WITHOUT CONTRAST TECHNIQUE: Contiguous axial images were obtained from the base of the skull through the vertex without intravenous contrast.  COMPARISON:  MRI 10/13/2020, CT brain 09/29/2020 FINDINGS: Brain: No acute territorial infarction, hemorrhage or intracranial mass. Mild atrophy. Extensive white matter hypodensity consistent with chronic small vessel ischemic change. Stable ventricle size Vascular: No hyperdense vessels.  Carotid vascular calcification Skull: Normal. Negative for fracture or focal lesion. Sinuses/Orbits: Patchy mucosal thickening in the sinuses Other: None IMPRESSION: 1. No CT evidence for acute intracranial abnormality. 2. Mild atrophy. Extensive chronic small vessel ischemic changes of the white matter Electronically Signed   By: Donavan Foil M.D.   On: 07/23/2021 21:14   DG CHEST PORT 1 VIEW  Result Date: 07/24/2021 CLINICAL DATA:  75 year old female status post central line placement. EXAM: PORTABLE CHEST 1 VIEW COMPARISON:  Chest x-ray 07/23/2021. FINDINGS: An endotracheal tube  is in place with tip 4.2 cm above the carina. There is a left-sided internal jugular central venous catheter with tip terminating in the distal superior vena cava. A nasogastric tube is seen extending into the stomach, however, the tip of the nasogastric tube extends below the lower margin of the image. Lung volumes are normal. No consolidative airspace disease. No pleural effusions. No pneumothorax. Persistent mass projecting over the lower right lung, likely in the lateral segment of the right middle lobe based on comparison with prior chest CT 02/22/2021, compatible with progressive neoplasm. No other definite suspicious appearing pulmonary nodules or masses are noted. No evidence of pulmonary edema. Heart size is normal. Upper mediastinal contours are within normal limits. IMPRESSION: 1. Support apparatus, as above. 2. Persistent right middle lobe mass highly concerning for neoplasm, either a primary bronchogenic carcinoma or solitary metastasis. Once again, further evaluation with chest CT is strongly recommended in the near future.  Electronically Signed   By: Vinnie Langton M.D.   On: 07/24/2021 05:45   DG Chest Portable 1 View  Result Date: 07/23/2021 CLINICAL DATA:  Altered mental status.  Intubation. EXAM: PORTABLE CHEST 1 VIEW COMPARISON:  Chest x-ray 03/22/2020, CT angio chest 02/22/2021 FINDINGS: Interval placement endotracheal tube with tip approximately 3 cm above the carina. Interval placement of an enteric tube coursing below the hemidiaphragm with tip and side port collimated off view. The heart and mediastinal contours are unchanged. Atherosclerotic plaque. Hilar vasculature prominence. Slight hyperinflation of the lungs. Interval increase in size of a right lung 2.5 cm density. No pulmonary edema. No pleural effusion. No pneumothorax. No acute osseous abnormality. Vascular clips overlie the neck. IMPRESSION: 1. Endotracheal tube with tip approximately 3 cm above the carina. 2. Enteric tube coursing below the hemidiaphragm with tip and side port collimated off view. 3. Interval increase in size of a right lung 2.5 cm density. Recommend CT chest for further evaluation. Electronically Signed   By: Iven Finn M.D.   On: 07/23/2021 21:00   EEG adult  Result Date: 07/25/2021 Lora Havens, MD     07/25/2021  1:40 PM Patient Name: Holly [name] MRN: [id number] Epilepsy Attending: Lora Havens Referring Physician/Provider: Dr Margaretha Seeds Date: 07/25/2021 Duration: 24.46 mins Patient history: 75yo F with ams. EEG to evaluate for seizure Level of alertness:  lethargic AEDs during EEG study: propofol Technical aspects: This EEG study was done with scalp electrodes positioned according to the 10-20 International system of electrode placement. Electrical activity was acquired at a sampling rate of _0  and reviewed with a high frequency filter of _1  and a low frequency filter of _2 . EEG data were recorded continuously and digitally stored. Description:  EEG showed continuous generalized polymorphic sharply  contoured 3 to 6 Hz theta-delta slowing. Generalized periodic discharges with triphasic morphology at 0.5-_3  were also noted.  Hyperventilation and photic stimulation were not performed.    ABNORMALITY - Periodic discharges with triphasic morphology, generalized ( GPDs) - Continuous slow, generalized  IMPRESSION: This study showed generalized periodic discharges with triphasic morphology at 0.5-1 Hz which is on the ictal-interictal continuum with low potential for seizures. This eeg pattern can also been seen due toxic-metabolic causes. Additionally there is moderate to severe diffuse encephalopathy, nonspecific etiology. No seizures were seen throughout the recording. Priyanka O Yadav   VAS [place] ABI WITH/WO TBI  Result Date: 07/24/2021  LOWER EXTREMITY DOPPLER STUDY Patient Name:  Holly [name]  Date of Exam:   07/24/2021 Medical Rec #:  [id number]      Accession #:    [id number] Date of Birth: [birth year]      Patient Gender: F Patient Age:   57 years Exam Location[facility]  [facility] Procedure:      VAS [place] ABI WITH/WO TBI Referring Phys: Ileene Hutchinson RICHARDS --------------------------------------------------------------------------------  Indications: Absent pedal pulses. High Risk Factors: Hypertension, hyperlipidemia. Other Factors: COPD on home O2, lung cancer.  Limitations: Today's exam was limited due to involuntary patient movement and              Ventilation, lines. Comparison Study: No prior study Performing Technologist: Sharion Dove RVS  Examination Guidelines: A complete evaluation includes at minimum, Doppler waveform signals and systolic blood pressure reading at the level of bilateral brachial, anterior tibial, and posterior tibial arteries, when vessel segments are accessible. Bilateral testing is considered an integral part of a complete examination. Photoelectric Plethysmograph (PPG) waveforms and toe systolic pressure readings are included as required and additional duplex testing as needed.  Limited examinations for reoccurring indications may be performed as noted.  ABI Findings: +---------+------------------+-----+-----------+--------+ Right    Rt Pressure (mmHg)IndexWaveform   Comment  +---------+------------------+-----+-----------+--------+ Brachial 123                    multiphasic         +---------+------------------+-----+-----------+--------+ PTA      111               0.90 biphasic            +---------+------------------+-----+-----------+--------+ DP       77                0.63 biphasic            +---------+------------------+-----+-----------+--------+ Great Toe50                0.41                     +---------+------------------+-----+-----------+--------+ +---------+------------------+-----+-----------+-----------------+ Left     Lt Pressure (mmHg)IndexWaveform   Comment           +---------+------------------+-----+-----------+-----------------+ Brachial                        multiphasicRestricted/line   +---------+------------------+-----+-----------+-----------------+ PTA      88                0.72 biphasic                     +---------+------------------+-----+-----------+-----------------+ DP       65                0.53 biphasic                     +---------+------------------+-----+-----------+-----------------+ Great Toe                                  constant movement +---------+------------------+-----+-----------+-----------------+ +-------+-----------+----------------+------------+------------+ ABI/TBIToday's ABIToday's TBI     Previous ABIPrevious TBI +-------+-----------+----------------+------------+------------+ Right  0.90       0.41                                     +-------+-----------+----------------+------------+------------+ Left   0.72       unable to attain                         +-------+-----------+----------------+------------+------------+  Summary: Right: Resting  right ankle-brachial index indicates mild right lower extremity arterial disease. The right toe-brachial index is abnormal. Left: Resting left ankle-brachial index indicates moderate left lower extremity arterial disease.  *See table(s) above for measurements and observations.     Preliminary    ECHOCARDIOGRAM COMPLETE  Result Date: 07/25/2021    ECHOCARDIOGRAM REPORT   Patient Name:   Holly [name] Date of Exam: 07/25/2021 Medical Rec #:  [id number]     Height:       64.0 in Accession #:    [id number]    Weight:       152.1 lb Date of Birth:  1946-03-28     BSA:          1.742 m Patient Age:    30 years      BP:           118/45 mmHg Patient Gender: F             HR:           72 bpm. Exam Location:  Inpatient Procedure: 2D Echo, 3D Echo, Cardiac Doppler and Color Doppler Indications:    [facility]) [[id number]]  History:        Patient has prior history of Echocardiogram examinations, most                 recent 02/23/2021. COPD, Signs/Symptoms:Dyspnea; Risk                 Factors:Hypertension and Dyslipidemia. Thyroid disease.  Sonographer:    Darlina Sicilian RDCS Referring Phys: [id number] [facility]  Sonographer Comments: Global longitudinal strain was attempted. IMPRESSIONS  1. Left ventricular ejection fraction, by estimation, is 65 to 70%. The left ventricle has normal function. The left ventricle has no regional wall motion abnormalities. Left ventricular diastolic parameters are consistent with Grade I diastolic dysfunction (impaired relaxation).  2. Right ventricular systolic function is normal. The right ventricular size is normal. Tricuspid regurgitation signal is inadequate for assessing PA pressure.  3. The mitral valve is normal in structure. Trivial mitral valve regurgitation. No evidence of mitral stenosis.  4. The aortic valve is tricuspid. Aortic valve regurgitation is not visualized. No aortic stenosis is present. FINDINGS  Left Ventricle: Left ventricular ejection fraction, by estimation, is 65  to 70%. The left ventricle has normal function. The left ventricle has no regional wall motion abnormalities. The left ventricular internal cavity size was normal in size. There is  no left ventricular hypertrophy. Left ventricular diastolic parameters are consistent with Grade I diastolic dysfunction (impaired relaxation). Right Ventricle: The right ventricular size is normal. Right vetricular wall thickness was not well visualized. Right ventricular systolic function is normal. Tricuspid regurgitation signal is inadequate for assessing PA pressure. Left Atrium: Left atrial size was normal in size. Right Atrium: Right atrial size was normal in size. Pericardium: There is no evidence of pericardial effusion. Mitral Valve: The mitral valve is normal in structure. Trivial mitral valve regurgitation. No evidence of mitral valve stenosis. Tricuspid Valve: The tricuspid valve is normal in structure. Tricuspid valve regurgitation is trivial. Aortic Valve: The aortic valve is tricuspid. Aortic valve regurgitation is not visualized. No aortic stenosis is present. Pulmonic Valve: The pulmonic valve was not well visualized. Pulmonic valve regurgitation is not visualized. Aorta: The aortic root and ascending aorta are structurally normal, with no evidence of dilitation. IAS/Shunts: The interatrial septum was not well visualized.  LEFT VENTRICLE PLAX 2D LVIDd:  5.00 cm   Diastology LVIDs:         3.60 cm   LV e' medial:    6.20 cm/s LV PW:         1.00 cm   LV E/e' medial:  15.0 LV IVS:        0.70 cm   LV e' lateral:   7.40 cm/s LVOT diam:     1.90 cm   LV E/e' lateral: 12.6 LV SV:         77 LV SV Index:   44 LVOT Area:     2.84 cm                           3D Volume EF:                          3D EF:        69 %                          LV EDV:       114 ml                          LV ESV:       35 ml                          LV SV:        79 ml RIGHT VENTRICLE RV S prime:     15.20 cm/s TAPSE (M-mode): 1.8 cm LEFT  ATRIUM             Index        RIGHT ATRIUM           Index LA diam:        3.20 cm 1.84 cm/m   RA Area:     10.70 cm LA Vol (A2C):   31.8 ml 18.26 ml/m  RA Volume:   22.20 ml  12.75 ml/m LA Vol (A4C):   23.8 ml 13.67 ml/m LA Biplane Vol: 30.4 ml 17.46 ml/m  AORTIC VALVE LVOT Vmax:   147.00 cm/s LVOT Vmean:  92.300 cm/s LVOT VTI:    0.271 m  AORTA Ao Root diam: 2.60 cm Ao Asc diam:  3.00 cm MITRAL VALVE MV Area (PHT): 3.11 cm     SHUNTS MV Decel Time: 244 msec     Systemic VTI:  0.27 m MV E velocity: 92.90 cm/s   Systemic Diam: 1.90 cm MV A velocity: 110.00 cm/s MV E/A ratio:  0.84 Oswaldo Milian MD Electronically signed by Oswaldo Milian MD Signature Date/Time: 07/25/2021/1:42:42 PM    Final    CT CHEST ABDOMEN PELVIS WO CONTRAST  Result Date: 07/24/2021 CLINICAL DATA:  Sepsis, found unresponsive at home EXAM: CT CHEST, ABDOMEN AND PELVIS WITHOUT CONTRAST TECHNIQUE: Multidetector CT imaging of the chest, abdomen and pelvis was performed following the standard protocol without IV contrast. Sagittal and coronal MPR images reconstructed from axial data set. No oral contrast administered. COMPARISON:  CT angio chest 02/22/2021, CT abdomen and pelvis 08/12/2014 FINDINGS: CT CHEST FINDINGS Cardiovascular: Atherosclerotic calcifications aorta, proximal great vessels and coronary arteries. Aorta normal caliber. Heart size normal. No pericardial effusion. Mediastinum/Nodes: Nasogastric tube traverses esophagus into stomach. Tip of endotracheal tube just above carina. Prior thyroidectomy. No thoracic adenopathy. Lungs/Pleura: Emphysematous changes  and central peribronchial thickening consistent with COPD. RIGHT middle lobe mass 2.0 x 1.7 x 2.4 cm increased since 10 mm diameter on 02/22/2021 minimal atelectasis RIGHT lower lobe. Scarring in LEFT upper lobe stable. No pulmonary infiltrate pleural effusion, or pneumothorax. Musculoskeletal: Osseous demineralization. No focal osseous lesions. CT ABDOMEN  PELVIS FINDINGS Hepatobiliary: Gallbladder and liver normal appearance Pancreas: Normal appearance Spleen: Normal appearance Adrenals/Urinary Tract: LEFT adrenal mass 15 x 12 mm unchanged low-attenuation consistent with adrenal adenoma. Adrenal glands, kidneys, and ureters otherwise normal appearance. Bladder decompressed by Foley catheter. Stomach/Bowel: Diverticulosis of descending and sigmoid colon. Minimal wall thickening sigmoid colon. Questionable subtle pericolic inflammatory changes. Cannot exclude subtle diverticulitis. Stomach decompressed by NG tube. Remaining bowel loops unremarkable. Normal appendix. Vascular/Lymphatic: Atherosclerotic calcifications aorta and iliac arteries without aneurysm. No adenopathy Reproductive: Uterus surgically absent. Questionable visualization of an atrophic LEFT ovary. Other: No free air or free fluid.  No hernia. Musculoskeletal: Osseous demineralization. IMPRESSION: Increased size of a RIGHT middle lobe mass 2.0 x 1.7 x 2.4 cm, previously 10 mm diameter on 02/22/2021, suspicious for a pulmonary neoplasm. COPD changes with minimal RIGHT lower lobe atelectasis. LEFT adrenal adenoma. Distal colonic diverticulosis with questionable subtle wall thickening and pericolic inflammatory changes at the sigmoid colon, cannot exclude subtle diverticulitis. No other acute intrathoracic, intra-abdominal or intrapelvic abnormalities. Aortic Atherosclerosis (ICD10-I70.0). Electronically Signed   By: Lavonia Dana M.D.   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 I discussed options of endoscopy and colonoscopy for further evaluation, while patient is intubated and sedated, and has an NG tube and a rectal tube. Without obvious GI blood loss, the patient's daughter does not want an EGD or a colonoscopy at this point. They prefer conservative management. As such will increase PPI to twice a day, recommend monitoring H&H, and transfusing as needed. We will follow.   LOS: 2 days   Ronnette Juniper, MD  07/25/2021, 1:44 PM

## 2021-07-25 NOTE — Progress Notes (Signed)
  Echocardiogram 2D Echocardiogram has been performed.  Darlina Sicilian M 07/25/2021, 10:40 AM

## 2021-07-26 DIAGNOSIS — J9602 Acute respiratory failure with hypercapnia: Secondary | ICD-10-CM | POA: Diagnosis not present

## 2021-07-26 DIAGNOSIS — J9601 Acute respiratory failure with hypoxia: Secondary | ICD-10-CM

## 2021-07-26 LAB — TYPE AND SCREEN
ABO/RH(D): O POS
Antibody Screen: NEGATIVE
Unit division: 0

## 2021-07-26 LAB — BASIC METABOLIC PANEL
Anion gap: 6 (ref 5–15)
BUN: 27 mg/dL — ABNORMAL HIGH (ref 8–23)
CO2: 32 mmol/L (ref 22–32)
Calcium: 8.9 mg/dL (ref 8.9–10.3)
Chloride: 101 mmol/L (ref 98–111)
Creatinine, Ser: 1.09 mg/dL — ABNORMAL HIGH (ref 0.44–1.00)
GFR, Estimated: 53 mL/min — ABNORMAL LOW (ref >60)
Glucose, Bld: 204 mg/dL — ABNORMAL HIGH (ref 70–99)
Potassium: 3.5 mmol/L (ref 3.5–5.1)
Sodium: 139 mmol/L (ref 135–145)

## 2021-07-26 LAB — CBC
HCT: 28 % — ABNORMAL LOW (ref 36.0–46.0)
HCT: 29.4 % — ABNORMAL LOW (ref 36.0–46.0)
HCT: 30.7 % — ABNORMAL LOW (ref 36.0–46.0)
Hemoglobin: 8.4 g/dL — ABNORMAL LOW (ref 12.0–15.0)
Hemoglobin: 8.6 g/dL — ABNORMAL LOW (ref 12.0–15.0)
Hemoglobin: 8.8 g/dL — ABNORMAL LOW (ref 12.0–15.0)
MCH: 29 pg (ref 26.0–34.0)
MCH: 28.6 pg (ref 26.0–34.0)
MCH: 28 pg (ref 26.0–34.0)
MCHC: 30 g/dL (ref 30.0–36.0)
MCHC: 29.3 g/dL — ABNORMAL LOW (ref 30.0–36.0)
MCHC: 28.7 g/dL — ABNORMAL LOW (ref 30.0–36.0)
MCV: 96.6 fL (ref 80.0–100.0)
MCV: 97.7 fL (ref 80.0–100.0)
MCV: 97.8 fL (ref 80.0–100.0)
Platelets: 134 K/uL — ABNORMAL LOW (ref 150–400)
Platelets: 129 10*3/uL — ABNORMAL LOW (ref 150–400)
Platelets: 132 10*3/uL — ABNORMAL LOW (ref 150–400)
RBC: 2.9 MIL/uL — ABNORMAL LOW (ref 3.87–5.11)
RBC: 3.01 MIL/uL — ABNORMAL LOW (ref 3.87–5.11)
RBC: 3.14 MIL/uL — ABNORMAL LOW (ref 3.87–5.11)
RDW: 17.6 % — ABNORMAL HIGH (ref 11.5–15.5)
RDW: 17.3 % — ABNORMAL HIGH (ref 11.5–15.5)
RDW: 17 % — ABNORMAL HIGH (ref 11.5–15.5)
WBC: 9.3 K/uL (ref 4.0–10.5)
WBC: 7.8 10*3/uL (ref 4.0–10.5)
WBC: 9.6 10*3/uL (ref 4.0–10.5)
nRBC: 0 % (ref 0.0–0.2)
nRBC: 0.3 % — ABNORMAL HIGH (ref 0.0–0.2)
nRBC: 0.2 % (ref 0.0–0.2)

## 2021-07-26 LAB — PHOSPHORUS: Phosphorus: 3.3 mg/dL (ref 2.5–4.6)

## 2021-07-26 LAB — GLUCOSE, CAPILLARY
Glucose-Capillary: 113 mg/dL — ABNORMAL HIGH (ref 70–99)
Glucose-Capillary: 187 mg/dL — ABNORMAL HIGH (ref 70–99)
Glucose-Capillary: 165 mg/dL — ABNORMAL HIGH (ref 70–99)
Glucose-Capillary: 161 mg/dL — ABNORMAL HIGH (ref 70–99)
Glucose-Capillary: 145 mg/dL — ABNORMAL HIGH (ref 70–99)
Glucose-Capillary: 101 mg/dL — ABNORMAL HIGH (ref 70–99)
Glucose-Capillary: 98 mg/dL (ref 70–99)

## 2021-07-26 LAB — MAGNESIUM: Magnesium: 2.3 mg/dL (ref 1.7–2.4)

## 2021-07-26 LAB — BPAM RBC
Blood Product Expiration Date: 202211012359
ISSUE DATE / TIME: 202210090640
Unit Type and Rh: 5100

## 2021-07-26 MED ORDER — HEPARIN SODIUM (PORCINE) 5000 UNIT/ML IJ SOLN
5000.0000 [IU] | Freq: Three times a day (TID) | INTRAMUSCULAR | Status: DC
  Administered 2021-07-26 – 2021-08-02 (×20): 5000 [IU] via SUBCUTANEOUS
  Filled 2021-07-26 (×20): qty 1

## 2021-07-26 MED ORDER — PREDNISONE 20 MG PO TABS
20.0000 mg | ORAL_TABLET | Freq: Every day | ORAL | Status: DC
  Administered 2021-07-27 – 2021-08-02 (×7): 20 mg via ORAL
  Filled 2021-07-26 (×7): qty 1

## 2021-07-26 MED ORDER — LEVOTHYROXINE SODIUM 25 MCG PO TABS
137.0000 ug | ORAL_TABLET | Freq: Every day | ORAL | Status: DC
  Administered 2021-07-27 – 2021-08-02 (×7): 137 ug via ORAL
  Filled 2021-07-26 (×8): qty 1

## 2021-07-26 MED ORDER — LORAZEPAM 2 MG/ML IJ SOLN
INTRAMUSCULAR | Status: AC
  Filled 2021-07-26: qty 1

## 2021-07-26 MED ORDER — METOPROLOL TARTRATE 5 MG/5ML IV SOLN
INTRAVENOUS | Status: AC
  Filled 2021-07-26: qty 5

## 2021-07-26 MED ORDER — LORAZEPAM 2 MG/ML IJ SOLN
1.0000 mg | Freq: Once | INTRAMUSCULAR | Status: AC
  Administered 2021-07-26: 1 mg via INTRAVENOUS

## 2021-07-26 MED ORDER — NALOXONE HCL 0.4 MG/ML IJ SOLN
INTRAMUSCULAR | Status: AC
  Filled 2021-07-26: qty 1

## 2021-07-26 MED ORDER — POLYETHYLENE GLYCOL 3350 17 G PO PACK: 17.0000 g | PACK | Freq: Every day | ORAL | Status: DC | PRN

## 2021-07-26 MED ORDER — DEXMEDETOMIDINE HCL IN NACL 400 MCG/100ML IV SOLN
0.4000 ug/kg/h | INTRAVENOUS | Status: DC
  Administered 2021-07-26: 0.4 ug/kg/h via INTRAVENOUS
  Filled 2021-07-26: qty 100

## 2021-07-26 MED ORDER — ORAL CARE MOUTH RINSE
15.0000 mL | Freq: Two times a day (BID) | OROMUCOSAL | Status: DC
  Administered 2021-07-27 – 2021-08-01 (×10): 15 mL via OROMUCOSAL

## 2021-07-26 MED ORDER — POTASSIUM CHLORIDE 20 MEQ PO PACK
40.0000 meq | PACK | Freq: Once | ORAL | Status: AC
  Administered 2021-07-26: 40 meq
  Filled 2021-07-26: qty 2

## 2021-07-26 MED ORDER — NALOXONE HCL 0.4 MG/ML IJ SOLN: 0.4000 mg | INTRAMUSCULAR | Status: DC | PRN

## 2021-07-26 MED ORDER — ALPRAZOLAM 0.5 MG PO TABS
0.5000 mg | ORAL_TABLET | Freq: Two times a day (BID) | ORAL | Status: DC | PRN
  Administered 2021-07-26 – 2021-08-01 (×8): 0.5 mg via ORAL
  Filled 2021-07-26 (×9): qty 1

## 2021-07-26 MED ORDER — POLYETHYLENE GLYCOL 3350 17 G PO PACK
17.0000 g | PACK | Freq: Every day | ORAL | Status: DC
  Administered 2021-07-27 – 2021-07-30 (×2): 17 g via ORAL
  Filled 2021-07-26 (×7): qty 1

## 2021-07-26 MED ORDER — NALOXONE HCL 0.4 MG/ML IJ SOLN
0.4000 mg | INTRAMUSCULAR | Status: DC | PRN
  Administered 2021-07-26 (×2): 0.4 mg via INTRAVENOUS
  Filled 2021-07-26: qty 1

## 2021-07-26 MED ORDER — ATORVASTATIN CALCIUM 10 MG PO TABS
20.0000 mg | ORAL_TABLET | Freq: Every day | ORAL | Status: DC
  Administered 2021-07-27 – 2021-08-02 (×7): 20 mg via ORAL
  Filled 2021-07-26 (×7): qty 2

## 2021-07-26 MED ORDER — METOPROLOL TARTRATE 5 MG/5ML IV SOLN
5.0000 mg | Freq: Once | INTRAVENOUS | Status: AC
  Administered 2021-07-26: 5 mg via INTRAVENOUS

## 2021-07-26 MED ORDER — CHLORHEXIDINE GLUCONATE 0.12 % MT SOLN
15.0000 mL | Freq: Two times a day (BID) | OROMUCOSAL | Status: DC
  Administered 2021-07-27 – 2021-08-02 (×13): 15 mL via OROMUCOSAL
  Filled 2021-07-26 (×12): qty 15

## 2021-07-26 MED ORDER — DOCUSATE SODIUM 100 MG PO CAPS
100.0000 mg | ORAL_CAPSULE | Freq: Two times a day (BID) | ORAL | Status: DC
  Administered 2021-07-27 – 2021-08-02 (×10): 100 mg via ORAL
  Filled 2021-07-26 (×14): qty 1

## 2021-07-26 NOTE — Progress Notes (Signed)
Lonzo C[place] 11:31 AM  Subjective: Patient seen and examined and case discussed with my partner Dr. Therisa Doyne and case discussed with the patient's daughter as well and she does not have any signs of bleeding and she is still intubated and heavily sedated and is getting tube feeds  Objective: Vital signs stable afebrile abdomen is soft patient not responsive but sedated hemoglobin stable BUN okay  Assessment: Multiple medical problems including anemia  Plan: Please let us know if we could be of any specific help during this hospital stay and specifically call us if signs of active bleeding or if GI work-up is requested  [facility]  office [phone] After 5PM or if no answer call [phone]

## 2021-07-26 NOTE — Progress Notes (Signed)
NAME:  Holly [name], MRN:  [id number], DOB:  12-29-1945, LOS: 3 ADMISSION DATE:  07/23/2021, CONSULTATION DATE:  07/23/21 REFERRING MD:   CHIEF COMPLAINT:   Unresponsiveness  History of Present Illness:  75 year old lady with PMH significant for COPD on home Oxygen and chronic steroid therapy, lung cancer diagnosed 5 years ago on intermittent palliative XRT, hypothyroidism among other conditions.  The patient was brought to the ED because of unresponsiveness.  The patient's daughter with whom she lives with, reported that her mother was alone on the day of admission because the companion that stays with her while the daughter is at work was not available.  The daughter called to check on her mother at lunchtime and her mother did not answer the phone.  Hence the daughter asked a friend to check in on her Mum.  The friend was able to assist the mother with her meal and into the lift recliner.   The daughter arrived home around 7pm and found her mother unresponsive so EMS was called.  Upon arrival to the ED, the patient had a GCS of 3 so she was intubated. CT head was negative but a venous blood gas found the patient to have a PCO2 of 95.6.  CXR did not show any acute pathology and her glucose was normal.  The patient was therefor referred for ICU admission.   Pertinent  Medical History  COPD, chronic hypoxemic respiratory failure, lung cancer diagnosed 5 years ago on intermittent palliative XRT, hypothyroidism, OSA not on CPAP  Significant Hospital Events: Including procedures, antibiotic start and stop dates in addition to other pertinent events   10/7 Admitted for unresponsiveness.  10/8 Hypotensive overnight and required vasopressor support. Unclear source of infxn 10/9 Transfused 1 PRBC  Interim History / Subjective:  10/10: no acute events reported overnight. Eeg with gpd's. Awakens but agitated/restless so will change sedation and hopefully move to extubate in next 24 hours.   Objective    Blood pressure (!) 100/46, pulse 64, temperature 99.1 F (37.3 C), resp. rate 20, height 5\' 4"  (1.626 m), weight 72.5 kg, SpO2 92 %.    Vent Mode: PRVC FiO2 (%):  [40 %] 40 % Set Rate:  [20 bmp] 20 bmp Vt Set:  [440 mL] 440 mL PEEP:  [5 cmH20] 5 cmH20 Plateau Pressure:  [16 cmH20-18 cmH20] 18 cmH20   Intake/Output Summary (Last 24 hours) at 07/26/2021 1103 Last data filed at 07/26/2021 0916 Gross per 24 hour  Intake 3348.09 ml  Output 1015 ml  Net 2333.09 ml   Filed Weights   07/24/21 0500 07/25/21 0405 07/26/21 0349  Weight: 66.1 kg 69 kg 72.5 kg   Physical Exam: General: Chronically ill, elderly-appearing, no acute distress HENT: NCAT, ETT in place Eyes: EOMI, no scleral icterus Respiratory: Diminished breath sounds bilaterally.  No crackles, wheezing or rales Cardiovascular: RRR, -M/R/G, no JVD GI: BS+, soft, nontender Extremities:-Edema,-tenderness Neuro: Sedated    Resolved Hospital Problem list     Assessment & Plan:   Acute on chronic hypoxic and hypercarbic respiratory failure now on mechanical ventilation. HX of COPD on continuous home Oxygen 3 liters and chronic prednisone OSA not on CPAP Plan:  --wean FIO2 for sats. 88% per protocol.  --Vent wean and extubate per protocol. --Scheduled Duonebs --VAP --PAD protocol for RASS goal 0  Septic shock, unclear etiology CT with subtle ?diverticulitis --Continue broad spectrum antibiotics --Echo with grade I diastolic dysfunction --cx ngtd --resume home prednisone.   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 Patient was still under induction medication when examined in the ED. 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 No evidence of active GI bleed on this admission. Suspect Hg related to frequent blood draws in ICU Plan:  --Transfused 1U PRBC --Hg goal >7 --appreciate GI, bid ppi and no plans for scope at this time.     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 Plan:  --MRSA screen neg this admission  Best Practice (right click and "Reselect all SmartList Selections" daily)   Diet/type: tubefeeds DVT prophylaxis: SCD GI prophylaxis: PPI Lines: Central line Foley:  Yes, and it is still needed Code Status:  full code Last date of multidisciplinary goals of care discussion [ 10/10] Full code per daughter  Labs   CBC: Recent Labs  Lab 07/23/21 2027 07/23/21 2113 07/25/21 0429 07/25/21 0926 07/25/21 1837 07/26/21 0355 07/26/21 0947  WBC 15.2*   < > 10.7* 10.1 10.2 9.3 7.8  NEUTROABS 9.8*  --   --   --   --   --   --   HGB 9.4*   < > 6.9* 7.8* 7.9* 8.4* 8.6*  HCT 35.2*   < > 24.1* 27.0* 27.4* 28.0* 29.4*  MCV 104.8*   < > 98.8 96.4 96.5 96.6 97.7  PLT 209   < > 151 134* 136* 134* 129*   < > = values in this interval not displayed.    Basic Metabolic Panel: Recent Labs  Lab 07/23/21 2027 07/23/21 2113 07/23/21 2200 07/24/21 0641 07/24/21 2036 07/25/21 0429 07/25/21 1700 07/26/21 0355  NA 142 146* 145 142  --  141  --  139  K 4.5 3.5 4.3 3.8  --  2.9*  --  3.5  CL 90*  --   --  95*  --  98  --  101  CO2 >45*  --   --  35*  --  34*  --  32  GLUCOSE 171*  --   --  109*  --  189*  --  204*  BUN 18  --   --  20  --  24*  --  27*  CREATININE 0.87  --   --  1.23*  --  1.02*  --  1.09*  CALCIUM 9.9  --   --  9.5  --  9.1  --  8.9  MG  --   --    --  1.8 1.7 2.5* 2.4 2.3  PHOS  --   --   --  1.2* 2.0* 4.4 3.3 3.3   GFR: Estimated Creatinine Clearance: 42.8 mL/min (A) (by C-G formula based on SCr of 1.09 mg/dL (H)). Recent Labs  Lab 07/23/21 2027 07/23/21 2330 07/24/21 6789 07/24/21 1158 07/25/21 0429 07/25/21 0926 07/25/21 1837 07/26/21 0355 07/26/21 0947  WBC 15.2*  --    < >  --    < >  10.1 10.2 9.3 7.8  LATICACIDVEN 0.6 2.0*  --  1.7  --   --   --   --   --    < > = values in this interval not displayed.    Liver Function Tests: Recent Labs  Lab 07/23/21 2027 07/25/21 0429  AST 12* 20  ALT 9 12  ALKPHOS 92 59  BILITOT 0.6 0.7  PROT 6.7 5.4*  ALBUMIN 2.7* 2.1*   No results for input(s): LIPASE, AMYLASE in the last 168 hours. Recent Labs  Lab 07/23/21 2330  AMMONIA 82*    ABG    Component Value Date/Time   PHART 7.453 (H) 07/24/2021 0344   PCO2ART 57.1 (H) 07/24/2021 0344   PO2ART 128 (H) 07/24/2021 0344   HCO3 39.4 (H) 07/24/2021 0344   TCO2 >50 (H) 07/23/2021 2200   O2SAT 99.4 07/24/2021 0344     Coagulation Profile: Recent Labs  Lab 07/23/21 2027  INR 0.9    Cardiac Enzymes: No results for input(s): CKTOTAL, CKMB, CKMBINDEX, TROPONINI in the last 168 hours.  HbA1C: Hgb A1c MFr Bld  Date/Time Value Ref Range Status  07/24/2021 06:41 AM 5.0 4.8 - 5.6 % Final    Comment:    (NOTE) Pre diabetes:          5.7%-6.4%  Diabetes:              >6.4%  Glycemic control for   <7.0% adults with diabetes     CBG: Recent Labs  Lab 07/25/21 1543 07/25/21 1947 07/25/21 2317 07/26/21 0327 07/26/21 0757  GLUCAP 203* 210* 158* 187* 165*   Critical care time: The patient is critically ill with multiple organ systems failure and requires high complexity decision making for assessment and support, frequent evaluation and titration of therapies, application of advanced monitoring technologies and extensive interpretation of multiple databases.  Critical care time 35 mins. This represents my  time independent of the NPs time taking care of the pt. This is excluding procedures.    Audria Nine DO [facility] 07/26/2021, 11:03 AM See Amion for pager If no response to pager, please call [phone] until 1900 After 1900 please call [facility] [phone]

## 2021-07-26 NOTE — Consult Note (Signed)
[facility] Nurse Consult Note: Patient receiving care in [facility] [phone] Reason for Consult: RLE wound Wound type: Right shin skin tear with 2-3 surrounding wounds that are scabbed over.  Pressure Injury POA: NA Wound bed: Pink/red friable with skin flap in place.  Drainage (amount, consistency, odor) Brown red Periwound: very thin skin Dressing procedure/placement/frequency: Clean the right shin wound with NS, pat dry. Apply Xeroform gauze over the open wound and secure with foam dressing. Carefully remove any foam dressings to prevent skin tears. If any new skin tears, may apply the Xeroform gauze over the tear before apply the foam dressing.  Monitor the wound area(s) for worsening of condition such as: Signs/symptoms of infection, increase in size, development of or worsening of odor, development of pain, or increased pain at the affected locations.   Notify the medical team if any of these develop.  Thank you for the consult. [facility] nurse will not follow at this time.   Please re-consult the [facility] team if needed.  Cathlean Marseilles Tamala Julian, MSN, RN, [place], Lysle Pearl, [facility] Wound Treatment Associate Pager [phone]

## 2021-07-26 NOTE — Procedures (Signed)
Extubation Procedure Note  Patient Details:   Name: Holly [name] DOB: Oct 06, 1946 MRN: [id number]   Airway Documentation:    Vent end date: 07/26/21 Vent end time: 1400   Evaluation  O2 sats: transiently fell during during procedure Complications: Complications of tachycardia, sporadically answering questions, staring at one spot on the ceiling. Narcan given & patient perked right up & alert/oriented.  Bilateral Breath Sounds: Clear, Diminished   After Narcan given patient now talking with daughter & is able to cough well. NRB was placed on post extubation when HR-144, SpO2-85%. Will wean to [place] soon.  Kathie Dike 07/26/2021, 2:24 PM

## 2021-07-26 NOTE — Progress Notes (Signed)
Updated pt's daughter. Will change code status to DNR/DNI at this time per pt and pt's daughters request.   Should she decline from resp standpoint we would shift to goals of comfort at that time.

## 2021-07-27 DIAGNOSIS — J9602 Acute respiratory failure with hypercapnia: Secondary | ICD-10-CM | POA: Diagnosis not present

## 2021-07-27 DIAGNOSIS — J9601 Acute respiratory failure with hypoxia: Secondary | ICD-10-CM | POA: Diagnosis not present

## 2021-07-27 LAB — CBC
HCT: 31 % — ABNORMAL LOW (ref 36.0–46.0)
HCT: 32.8 % — ABNORMAL LOW (ref 36.0–46.0)
HCT: 34.3 % — ABNORMAL LOW (ref 36.0–46.0)
Hemoglobin: 8.7 g/dL — ABNORMAL LOW (ref 12.0–15.0)
Hemoglobin: 9.6 g/dL — ABNORMAL LOW (ref 12.0–15.0)
Hemoglobin: 9.7 g/dL — ABNORMAL LOW (ref 12.0–15.0)
MCH: 28.1 pg (ref 26.0–34.0)
MCH: 28.7 pg (ref 26.0–34.0)
MCH: 27.9 pg (ref 26.0–34.0)
MCHC: 28.1 g/dL — ABNORMAL LOW (ref 30.0–36.0)
MCHC: 29.3 g/dL — ABNORMAL LOW (ref 30.0–36.0)
MCHC: 28.3 g/dL — ABNORMAL LOW (ref 30.0–36.0)
MCV: 100 fL (ref 80.0–100.0)
MCV: 97.9 fL (ref 80.0–100.0)
MCV: 98.6 fL (ref 80.0–100.0)
Platelets: 132 10*3/uL — ABNORMAL LOW (ref 150–400)
Platelets: 123 10*3/uL — ABNORMAL LOW (ref 150–400)
Platelets: 137 10*3/uL — ABNORMAL LOW (ref 150–400)
RBC: 3.1 MIL/uL — ABNORMAL LOW (ref 3.87–5.11)
RBC: 3.35 MIL/uL — ABNORMAL LOW (ref 3.87–5.11)
RBC: 3.48 MIL/uL — ABNORMAL LOW (ref 3.87–5.11)
RDW: 17.2 % — ABNORMAL HIGH (ref 11.5–15.5)
RDW: 17.1 % — ABNORMAL HIGH (ref 11.5–15.5)
RDW: 16.9 % — ABNORMAL HIGH (ref 11.5–15.5)
WBC: 7 10*3/uL (ref 4.0–10.5)
WBC: 7.2 10*3/uL (ref 4.0–10.5)
WBC: 7.8 10*3/uL (ref 4.0–10.5)
nRBC: 0.3 % — ABNORMAL HIGH (ref 0.0–0.2)
nRBC: 0.3 % — ABNORMAL HIGH (ref 0.0–0.2)
nRBC: 0.4 % — ABNORMAL HIGH (ref 0.0–0.2)

## 2021-07-27 LAB — GLUCOSE, CAPILLARY
Glucose-Capillary: 125 mg/dL — ABNORMAL HIGH (ref 70–99)
Glucose-Capillary: 143 mg/dL — ABNORMAL HIGH (ref 70–99)
Glucose-Capillary: 146 mg/dL — ABNORMAL HIGH (ref 70–99)
Glucose-Capillary: 90 mg/dL (ref 70–99)
Glucose-Capillary: 93 mg/dL (ref 70–99)
Glucose-Capillary: 95 mg/dL (ref 70–99)

## 2021-07-27 MED ORDER — BOOST / RESOURCE BREEZE PO LIQD CUSTOM
1.0000 | Freq: Three times a day (TID) | ORAL | Status: DC
  Administered 2021-07-27 – 2021-08-02 (×15): 1 via ORAL
  Filled 2021-07-27: qty 1

## 2021-07-27 MED ORDER — METOPROLOL SUCCINATE ER 50 MG PO TB24
50.0000 mg | ORAL_TABLET | Freq: Every day | ORAL | Status: DC
  Administered 2021-07-27 – 2021-08-02 (×7): 50 mg via ORAL
  Filled 2021-07-27 (×7): qty 1

## 2021-07-27 MED ORDER — FUROSEMIDE 10 MG/ML IJ SOLN
20.0000 mg | Freq: Once | INTRAMUSCULAR | Status: AC
  Administered 2021-07-27: 20 mg via INTRAVENOUS
  Filled 2021-07-27: qty 2

## 2021-07-27 MED ORDER — DONEPEZIL HCL 10 MG PO TABS
10.0000 mg | ORAL_TABLET | Freq: Every day | ORAL | Status: DC
  Administered 2021-07-27 – 2021-08-01 (×6): 10 mg via ORAL
  Filled 2021-07-27 (×7): qty 1

## 2021-07-27 MED ORDER — PANTOPRAZOLE SODIUM 40 MG PO TBEC
40.0000 mg | DELAYED_RELEASE_TABLET | Freq: Two times a day (BID) | ORAL | Status: DC
  Administered 2021-07-27 – 2021-08-02 (×13): 40 mg via ORAL
  Filled 2021-07-27 (×14): qty 1

## 2021-07-27 MED ORDER — POTASSIUM CHLORIDE 20 MEQ PO PACK
40.0000 meq | PACK | Freq: Once | ORAL | Status: AC
  Administered 2021-07-27: 40 meq
  Filled 2021-07-27: qty 2

## 2021-07-27 MED ORDER — ALPRAZOLAM 0.25 MG PO TABS
0.2500 mg | ORAL_TABLET | Freq: Every day | ORAL | Status: DC
  Administered 2021-07-27 – 2021-08-01 (×6): 0.25 mg via ORAL
  Filled 2021-07-27 (×6): qty 1

## 2021-07-27 MED ORDER — BUPROPION HCL ER (XL) 150 MG PO TB24
300.0000 mg | ORAL_TABLET | Freq: Every day | ORAL | Status: DC
  Administered 2021-07-27 – 2021-08-02 (×7): 300 mg via ORAL
  Filled 2021-07-27 (×5): qty 2
  Filled 2021-07-27: qty 1
  Filled 2021-07-27: qty 2

## 2021-07-27 NOTE — Progress Notes (Signed)
NAME:  Holly [name], MRN:  [id number], DOB:  August 04, 1946, LOS: 4 ADMISSION DATE:  07/23/2021, CONSULTATION DATE:  07/23/21 REFERRING MD:   CHIEF COMPLAINT:   Unresponsiveness  History of Present Illness:  75 year old lady with PMH significant for COPD on home Oxygen and chronic steroid therapy, lung cancer diagnosed 5 years ago on intermittent palliative XRT, hypothyroidism among other conditions.  The patient was brought to the ED because of unresponsiveness.  The patient's daughter with whom she lives with, reported that her mother was alone on the day of admission because the companion that stays with her while the daughter is at work was not available.  The daughter called to check on her mother at lunchtime and her mother did not answer the phone.  Hence the daughter asked a friend to check in on her Mum.  The friend was able to assist the mother with her meal and into the lift recliner.   The daughter arrived home around 7pm and found her mother unresponsive so EMS was called.  Upon arrival to the ED, the patient had a GCS of 3 so she was intubated. CT head was negative but a venous blood gas found the patient to have a PCO2 of 95.6.  CXR did not show any acute pathology and her glucose was normal.  The patient was therefor referred for ICU admission.   Pertinent  Medical History  COPD, chronic hypoxemic respiratory failure, lung cancer diagnosed 5 years ago on intermittent palliative XRT, hypothyroidism, OSA not on CPAP  Significant Hospital Events: Including procedures, antibiotic start and stop dates in addition to other pertinent events   10/7 Admitted for unresponsiveness.  10/8 Hypotensive overnight and required vasopressor support. Unclear source of infxn 10/9 Transfused 1 PRBC  Interim History / Subjective:  10/11: did well overnight since extubation. Pleasantly confused. Daughter at bedside. On 4L [place]. Off precedex and did not require overnight per RN.  10/10: no acute events  reported overnight. Eeg with gpd's. Awakens but agitated/restless so will change sedation and hopefully move to extubate in next 24 hours.   Objective   Blood pressure (!) 151/59, pulse 91, temperature 98.8 F (37.1 C), temperature source Oral, resp. rate (!) 21, height 5\' 4"  (1.626 m), weight 71.2 kg, SpO2 97 %.    Vent Mode: CPAP;PSV FiO2 (%):  [36 %-40 %] 36 % Set Rate:  [20 bmp] 20 bmp Vt Set:  [440 mL] 440 mL PEEP:  [5 cmH20] 5 cmH20 Pressure Support:  [8 cmH20] 8 cmH20 Plateau Pressure:  [16 cmH20] 16 cmH20   Intake/Output Summary (Last 24 hours) at 07/27/2021 1052 Last data filed at 07/27/2021 0800 Gross per 24 hour  Intake 704.2 ml  Output 1300 ml  Net -595.8 ml   Filed Weights   07/25/21 0405 07/26/21 0349 07/27/21 0449  Weight: 69 kg 72.5 kg 71.2 kg   Physical Exam: General: Chronically ill, elderly-appearing, no acute distress HENT: NCAT, ETT in place Eyes: EOMI, no scleral icterus Respiratory: Diminished breath sounds bilaterally.  No crackles, wheezing or rales Cardiovascular: RRR, -M/R/G, no JVD GI: BS+, soft, nontender Extremities:-Edema,-tenderness Neuro: Sedated    Resolved Hospital Problem list     Assessment & Plan:   Acute on chronic hypoxic and hypercarbic respiratory failure now on mechanical ventilation. HX of COPD on continuous home Oxygen 3 liters and chronic prednisone OSA not on CPAP Plan:  --wean FIO2 for sats. 88% per protocol.  --extubated 10/10 and doing well --Scheduled Duonebs  Septic  shock, unclear etiology CT with subtle ?diverticulitis --Continue broad spectrum antibiotics --Echo with grade I diastolic dysfunction --cx ngtd --cont home prednisone.   Acute metabolic encephalopathy secondary to hypercarbia and hypoxia CT head negative with 67mm meningioma. Patient was still under induction medication when examined in the ED. 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 No evidence of active GI bleed on this admission. Suspect Hg related to frequent blood draws in ICU Plan:  --Transfused 1U PRBC --Hg goal >7 --appreciate GI, bid ppi and no plans for scope at this time.     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  Order written to d/c  Foley:  Yes, and it is no longer needed Code Status:  full code Last date of multidisciplinary goals of care discussion [ 10/11] DNR after discussion yesterday with pt and her daughter.  Will transfer from ICU and ask TRH to assume care. CCM will sign off Labs   CBC: Recent Labs  Lab 07/23/21 2027 07/23/21 2113 07/25/21 1837 07/26/21 0355 07/26/21 0947 07/26/21 1814 07/27/21 0445  WBC 15.2*   < > 10.2 9.3 7.8 9.6 7.0  NEUTROABS 9.8*  --   --   --   --   --   --   HGB 9.4*   < > 7.9* 8.4* 8.6* 8.8* 8.7*  HCT 35.2*   < > 27.4* 28.0* 29.4* 30.7* 31.0*  MCV 104.8*   < > 96.5 96.6 97.7 97.8 100.0  PLT 209   < > 136* 134* 129* 132* 132*   < > = values in this interval not displayed.    Basic Metabolic Panel: Recent Labs  Lab 07/23/21 2027 07/23/21 2113 07/23/21 2200 07/24/21 0641 07/24/21 2036  07/25/21 0429 07/25/21 1700 07/26/21 0355  NA 142 146* 145 142  --  141  --  139  K 4.5 3.5 4.3 3.8  --  2.9*  --  3.5  CL 90*  --   --  95*  --  98  --  101  CO2 >45*  --   --  35*  --  34*  --  32  GLUCOSE 171*  --   --  109*  --  189*  --  204*  BUN 18  --   --  20  --  24*  --  27*  CREATININE 0.87  --   --  1.23*  --  1.02*  --  1.09*  CALCIUM 9.9  --   --  9.5  --  9.1  --  8.9  MG  --   --   --  1.8 1.7 2.5*  2.4 2.3  PHOS  --   --   --  1.2* 2.0* 4.4 3.3 3.3   GFR: Estimated Creatinine Clearance: 42.5 mL/min (A) (by C-G formula based on SCr of 1.09 mg/dL (H)). Recent Labs  Lab 07/23/21 2027 07/23/21 2330 07/24/21 0641 07/24/21 1158 07/25/21 0429 07/26/21 0355 07/26/21 0947 07/26/21 1814 07/27/21 0445  WBC 15.2*  --    < >  --    < > 9.3 7.8 9.6 7.0  LATICACIDVEN 0.6 2.0*  --  1.7  --   --   --   --   --    < > = values in this interval not displayed.    Liver Function Tests: Recent Labs  Lab 07/23/21 2027 07/25/21 0429  AST 12* 20  ALT 9 12  ALKPHOS 92 59  BILITOT 0.6 0.7  PROT 6.7 5.4*  ALBUMIN 2.7* 2.1*   No results for input(s): LIPASE, AMYLASE in the last 168 hours. Recent Labs  Lab 07/23/21 2330  AMMONIA 82*    ABG    Component Value Date/Time   PHART 7.453 (H) 07/24/2021 0344   PCO2ART 57.1 (H) 07/24/2021 0344   PO2ART 128 (H) 07/24/2021 0344   HCO3 39.4 (H) 07/24/2021 0344   TCO2 >50 (H) 07/23/2021 2200   O2SAT 99.4 07/24/2021 0344     Coagulation Profile: Recent Labs  Lab 07/23/21 2027  INR 0.9    Cardiac Enzymes: No results for input(s): CKTOTAL, CKMB, CKMBINDEX, TROPONINI in the last 168 hours.  HbA1C: Hgb A1c MFr Bld  Date/Time Value Ref Range Status  07/24/2021 06:41 AM 5.0 4.8 - 5.6 % Final    Comment:    (NOTE) Pre diabetes:          5.7%-6.4%  Diabetes:              >6.4%  Glycemic control for   <7.0% adults with diabetes     CBG: Recent Labs  Lab 07/26/21 1550 07/26/21 1929 07/26/21 2305 07/27/21 0302  07/27/21 0738  GLUCAP 145* 101* 98 95 90    care time 35 mins. This represents my time independent of the NPs time taking care of the pt. This is excluding procedures.    Audria Nine DO [facility] 07/27/2021, 10:52 AM See Amion for pager If no response to pager, please call [phone] until 1900 After 1900 please call [facility] [phone]

## 2021-07-27 NOTE — Progress Notes (Signed)
Nutrition Follow-up  DOCUMENTATION CODES:   Not applicable  INTERVENTION:   Boost Breeze po TID, each supplement provides 250 kcal and 9 grams of protein   When diet advanced to at least full liquids, change PO supplements: Ensure Enlive po TID, each supplement provides 350 kcal and 20 grams of protein. Carnation Breakfast Essentials mixed with skim milk BID with meals.  NUTRITION DIAGNOSIS:   Inadequate oral intake related to acute illness as evidenced by NPO status.  Ongoing   GOAL:   Patient will meet greater than or equal to 90% of their needs  Unmet  MONITOR:   Vent status, TF tolerance, Labs, Weight trends  REASON FOR ASSESSMENT:   Ventilator, Consult Enteral/tube feeding initiation and management  ASSESSMENT:   75 yo female admitted with acute on chronic respiratory failure requiring intubation, septic shockPMH includes COPD on home oxygen, lung cancer dx 5 years ago on intermittent palliative XRT, PVD, chronic anemia, HLD  Discussed patient in ICU rounds and with RN today. Patient was extubated 10/10. RN has given some applesauce and she tolerated well. Diet advanced to clear liquids this afternoon.  Spoke with patient and daughter at bedside. Patient reports 10 lb weight loss r/t decreased appetite at home x 3-6 months. Appetite improved with addition of an appetite stimulant, but decreased again ~ 2 weeks PTA. Patient states that she likes Ensure, although daughter says patient will not drink them at home. Patient agreed to drink chocolate Ensure between meals and chocolate Carnation Breakfast Essentials with meals. She does not eat well at home because she does not like to eat alone. She receives 7 microwavable meals from the senior center weekly. Her daughter eats dinner with her every day, but she does not eat much for breakfast or lunch. She also states that she does not want the breathing tube placed down her throat ever again. Code status was changed to DNR  by medical team on 10/10.   Weight history reveals 14% weight loss from 02/16/21 to 07/24/21. Weight is up to 71.2 kg today, increase in weight likely r/t positive fluid status with mild edema present today. I/O +8.5 L since admission.  Labs reviewed.  CBG: 95-90-93  Medications reviewed and include Novolog SSI, Protonix, Miralax, prednisone.  Patient meets criteria for moderate malnutrition with mild-moderate depletion of muscle mass and 14% weight loss within 6 months.   NUTRITION - FOCUSED PHYSICAL EXAM:  Flowsheet Row Most Recent Value  Orbital Region No depletion  Upper Arm Region No depletion  Thoracic and Lumbar Region No depletion  Buccal Region No depletion  Temple Region No depletion  Clavicle Bone Region Mild depletion  Clavicle and Acromion Bone Region Mild depletion  Scapular Bone Region Unable to assess  Dorsal Hand Moderate depletion  Patellar Region Moderate depletion  Anterior Thigh Region Moderate depletion  Posterior Calf Region Moderate depletion  Edema (RD Assessment) Mild  Hair Reviewed  Eyes Reviewed  Mouth Reviewed  Skin Reviewed  Nails Reviewed       Diet Order:   Diet Order             Diet clear liquid Room service appropriate? Yes; Fluid consistency: Thin  Diet effective now                   EDUCATION NEEDS:   Not appropriate for education at this time  Skin:  Skin Assessment: Reviewed RN Assessment  Last BM:  10/11  Height:   Ht Readings from Last 1 Encounters:  07/23/21 5\' 4"  (1.626 m)    Weight:   Wt Readings from Last 1 Encounters:  07/27/21 71.2 kg    BMI:  Body mass index is 26.94 kg/m.  Estimated Nutritional Needs:   Kcal:  1550-1750  Protein:  90-110 gm  Fluid:  1.6-1.8 L    Lucas Mallow, RD, LDN, CNSC Please refer to Amion for contact information.

## 2021-07-28 ENCOUNTER — Encounter (HOSPITAL_COMMUNITY): Payer: Self-pay | Admitting: Internal Medicine

## 2021-07-28 DIAGNOSIS — J9621 Acute and chronic respiratory failure with hypoxia: Secondary | ICD-10-CM | POA: Diagnosis not present

## 2021-07-28 LAB — BASIC METABOLIC PANEL
Anion gap: 6 (ref 5–15)
BUN: 16 mg/dL (ref 8–23)
CO2: 38 mmol/L — ABNORMAL HIGH (ref 22–32)
Calcium: 9.7 mg/dL (ref 8.9–10.3)
Chloride: 96 mmol/L — ABNORMAL LOW (ref 98–111)
Creatinine, Ser: 0.74 mg/dL (ref 0.44–1.00)
GFR, Estimated: >60 mL/min (ref >60)
Glucose, Bld: 88 mg/dL (ref 70–99)
Potassium: 3.9 mmol/L (ref 3.5–5.1)
Sodium: 140 mmol/L (ref 135–145)

## 2021-07-28 LAB — CBC
HCT: 35.4 % — ABNORMAL LOW (ref 36.0–46.0)
HCT: 33.9 % — ABNORMAL LOW (ref 36.0–46.0)
Hemoglobin: 10.3 g/dL — ABNORMAL LOW (ref 12.0–15.0)
Hemoglobin: 9.9 g/dL — ABNORMAL LOW (ref 12.0–15.0)
MCH: 28.1 pg (ref 26.0–34.0)
MCH: 27.8 pg (ref 26.0–34.0)
MCHC: 29.1 g/dL — ABNORMAL LOW (ref 30.0–36.0)
MCHC: 29.2 g/dL — ABNORMAL LOW (ref 30.0–36.0)
MCV: 96.7 fL (ref 80.0–100.0)
MCV: 95.2 fL (ref 80.0–100.0)
Platelets: 139 10*3/uL — ABNORMAL LOW (ref 150–400)
Platelets: 132 10*3/uL — ABNORMAL LOW (ref 150–400)
RBC: 3.66 MIL/uL — ABNORMAL LOW (ref 3.87–5.11)
RBC: 3.56 MIL/uL — ABNORMAL LOW (ref 3.87–5.11)
RDW: 16.5 % — ABNORMAL HIGH (ref 11.5–15.5)
RDW: 16.2 % — ABNORMAL HIGH (ref 11.5–15.5)
WBC: 8 10*3/uL (ref 4.0–10.5)
WBC: 8 10*3/uL (ref 4.0–10.5)
nRBC: 0.2 % (ref 0.0–0.2)
nRBC: 0 % (ref 0.0–0.2)

## 2021-07-28 LAB — GLUCOSE, CAPILLARY
Glucose-Capillary: 90 mg/dL (ref 70–99)
Glucose-Capillary: 108 mg/dL — ABNORMAL HIGH (ref 70–99)
Glucose-Capillary: 130 mg/dL — ABNORMAL HIGH (ref 70–99)
Glucose-Capillary: 144 mg/dL — ABNORMAL HIGH (ref 70–99)
Glucose-Capillary: 112 mg/dL — ABNORMAL HIGH (ref 70–99)
Glucose-Capillary: 93 mg/dL (ref 70–99)

## 2021-07-28 LAB — CULTURE, BLOOD (ROUTINE X 2)
Culture: NO GROWTH
Special Requests: ADEQUATE

## 2021-07-28 MED ORDER — IPRATROPIUM-ALBUTEROL 0.5-2.5 (3) MG/3ML IN SOLN
3.0000 mL | Freq: Four times a day (QID) | RESPIRATORY_TRACT | Status: DC | PRN
  Administered 2021-07-28 – 2021-08-01 (×10): 3 mL via RESPIRATORY_TRACT
  Filled 2021-07-28 (×9): qty 3

## 2021-07-28 MED ORDER — IPRATROPIUM-ALBUTEROL 0.5-2.5 (3) MG/3ML IN SOLN
3.0000 mL | Freq: Three times a day (TID) | RESPIRATORY_TRACT | Status: DC
  Administered 2021-07-28: 3 mL via RESPIRATORY_TRACT
  Filled 2021-07-28: qty 3

## 2021-07-28 MED ORDER — LOSARTAN POTASSIUM 50 MG PO TABS
50.0000 mg | ORAL_TABLET | Freq: Every day | ORAL | Status: DC
  Administered 2021-07-28 – 2021-08-02 (×6): 50 mg via ORAL
  Filled 2021-07-28 (×6): qty 1

## 2021-07-28 MED ORDER — HYDRALAZINE HCL 25 MG PO TABS
25.0000 mg | ORAL_TABLET | Freq: Four times a day (QID) | ORAL | Status: DC | PRN
  Administered 2021-07-28 – 2021-08-02 (×3): 25 mg via ORAL
  Filled 2021-07-28 (×4): qty 1

## 2021-07-28 NOTE — TOC Initial Note (Signed)
Transition of Care [facility]) - Initial/Assessment Note    Patient Details  Name: Holly [name] MRN: [id number] Date of Birth: 1946/05/17  [facility]) CM/SW Contact:    Angelita Ingles, RN Phone Number:[phone]  07/28/2021, 1:56 PM  Clinical Narrative:                 TOC following patient with high risk for readmission. CM at bedside to speak with patient and daughter. Patient states that she is from home where her daughter lives with her. Patient has wheelchair, walker, lift chair; walk in shower and ramp. Patient does have Home O2 as baseline. Patient reports that she is being followed by Dr. Harlan Stains as PCP and has a pulmonologist and oncologist that help to manage her care. Patient states that she has access to her medications and treatments and her daughter is able to provide transportation to appointments. TOC will continue to follow patient for any disposition needs.    Expected Discharge Plan: Home/Self Care Barriers to Discharge: Continued Medical Work up   Patient Goals and CMS Choice Patient states their goals for this hospitalization and ongoing recovery are:: Just wants to get better to go home CMS Medicare.gov Compare Post Acute Care list provided to::  (n/a) Choice offered to / list presented to : NA  Expected Discharge Plan and Services Expected Discharge Plan: Home/Self Care In-house Referral: NA Discharge Planning Services: CM Consult Post Acute Care Choice: NA Living arrangements for the past 2 months: Single Family Home                 DME Arranged: N/A DME Agency: NA       HH Arranged: NA HH Agency: NA        Prior Living Arrangements/Services Living arrangements for the past 2 months: Single Family Home Lives with:: Adult Children Patient language and need for interpreter reviewed:: Yes Do you feel safe going back to the place where you live?: Yes      Need for Family Participation in Patient Care: Yes (Comment) Care giver support  system in place?: Yes (comment) Current home services:  (n/a) Criminal Activity/Legal Involvement Pertinent to Current Situation/Hospitalization: No - Comment as needed  Activities of Daily Living      Permission Sought/Granted Permission sought to share information with : Family Supports Permission granted to share information with : Yes, Verbal Permission Granted  Share Information with NAME: [facility]     Permission granted to share info w Relationship: daughter     Emotional Assessment Appearance:: Appears stated age Attitude/Demeanor/Rapport: Gracious Affect (typically observed): Pleasant Orientation: : Oriented to Self, Oriented to Place, Oriented to  Time Alcohol / Substance Use: Not Applicable Psych Involvement: No (comment)  Admission diagnosis:  Acute on chronic respiratory failure (HCC) [J96.20] Acute respiratory failure with hypoxia and hypercapnia ([place]) [J96.01, J96.02] Patient Active Problem List   Diagnosis Date Noted   Encounter for central line placement    Acute on chronic respiratory failure ([place]) 07/23/2021   Hypotension 02/23/2021   Hypokalemia 02/23/2021   Acute renal failure superimposed on stage 3a chronic kidney disease ([place]) 02/22/2021   GI bleed 02/16/2021   ABLA (acute blood loss anemia) 02/16/2021   Diverticulitis 02/16/2021   Current chronic use of systemic steroids 02/16/2021   Pressure injury of skin 02/16/2021   Olecranon bursitis of right elbow 10/02/2020   Ulna, olecranon process fracture, right, closed, with routine healing, subsequent encounter 10/01/2020   Primary malignant neoplasm of left  upper lobe of lung ([place]) 07/09/2019   Closed fracture of right olecranon process 12/04/2017   Cough 11/11/2015   Chronic respiratory failure with hypoxia ([place]) 11/11/2014   Cigarette smoker 10/16/2014   Multinodular thyroid 07/30/2014   Neoplasm of uncertain behavior of thyroid gland, 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   COPD exacerbation ([place])  02/11/2013   Pedal edema 02/11/2013   COPD GOLD III  06/11/2010   CARCINOMA, BASAL CELL, 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 [place] [address] Phone: [phone] Fax: [phone]     Social Determinants of Health (SDOH) Interventions    Readmission Risk Interventions Readmission Risk Prevention Plan 07/28/2021 02/18/2021  Transportation Screening Complete Complete  PCP or Specialist Appt within 5-7 Days - Complete  Home Care Screening - Complete  Medication Review (RN CM) - Complete  Medication Review (RN Care Manager) Referral to Pharmacy -  PCP or Specialist appointment within 3-5 days of discharge Complete -  [place] or Home Care Consult Patient refused -  SW Recovery Care/Counseling Consult Complete -  Palliative Care Screening Not Applicable -  [place] Not Applicable -  Some recent data might be hidden

## 2021-07-28 NOTE — Evaluation (Signed)
Physical Therapy Evaluation Patient Details Name: Holly [name] MRN: [id number] DOB: Nov 07, 1945 Today's Date: 07/28/2021  History of Present Illness  75 yo female presents to [facility] on 10/7 with unresponsiveness, found by daughter, arrives with GCS of 3. Pt with working diagnoses of acute on chronic respiratory failure, septic shock, acute metabolic encephalopathy. ETT 10/7-10/10. PMH includes COPD on home oxygen, hypertension, reported lung cancer on palliative radiation, prior GIB, anxiety, urinary incontinence, cervicalgia, anxiety, osteopenia, thyroidectomy 2015, ORIF R elbow fx 2019.  Clinical Impression   Pt presents with generalized weakness, impaired standing balance, tachypnea to 30s with mobility but sats 97% and greater on 4LO2, confusion with difficulty performing mobility tasks as a result, and impaired activity tolerance. Pt to benefit from acute PT to address deficits. Pt requiring min-mod assist for bed mobility and transfer to recliner at bedside, VSS except for respiration rate up to 33 breaths/min. PT recommending palliative consult, pt's daughter concerned about mother's future and where she will d/c when medically ready. PT to progress mobility as tolerated, and will continue to follow acutely.         Recommendations for follow up therapy are one component of a multi-disciplinary discharge planning process, led by the attending physician.  Recommendations may be updated based on patient status, additional functional criteria and insurance authorization.  Follow Up Recommendations SNF (vs home with 24/7 support from family and friends)    Equipment Recommendations  None recommended by PT    Recommendations for Other Services       Precautions / Restrictions Precautions Precautions: Fall Restrictions Weight Bearing Restrictions: No      Mobility  Bed Mobility Overal bed mobility: Needs Assistance Bed Mobility: Supine to Sit     Supine to sit: Mod assist;HOB  elevated     General bed mobility comments: mod assist for trunk elevation, LE translation to EOB. cues for sequencing task.    Transfers Overall transfer level: Needs assistance Equipment used: Rolling walker (2 wheeled) Transfers: Sit to/from [facility] Sit to Stand: Min assist Stand pivot transfers: Min assist       General transfer comment: min assist for power up, rise, steady, and pivot safely to recliner. Cues for hand placement when rising/sitting and sequencing steps to chair.  Ambulation/Gait                Stairs            Wheelchair Mobility    Modified Rankin (Stroke Patients Only)       Balance Overall balance assessment: Needs assistance Sitting-balance support: No upper extremity supported Sitting balance-Leahy Scale: Fair     Standing balance support: Bilateral upper extremity supported;During functional activity Standing balance-Leahy Scale: Poor Standing balance comment: reliant on external support                             Pertinent Vitals/Pain Pain Assessment: Faces Faces Pain Scale: Hurts little more Pain Location: dyspnea Pain Descriptors / Indicators: Discomfort Pain Intervention(s): Limited activity within patient's tolerance;Monitored during session;Repositioned    Home Living Family/patient expects to be discharged to:: Private residence Living Arrangements: Children;Other (Comment) (daughter works but takes care of pt's ADLs mostly, friend comes over during the day to help with feeding) Available Help at Discharge: Family;Available 24 hours/day Type of Home: Mobile home Home Access: Ramped entrance     Home Layout: One level Home Equipment: Shower seat;Cane - single point;Walker - 4 wheels;Grab bars -  tub/shower;Wheelchair - Education administrator (comment)      Prior Function Level of Independence: Needs assistance   Gait / Transfers Assistance Needed: pt reports using rollator for household  distances, per pt's daughter pt ambulates to and from bathroom only  ADL's / Homemaking Assistance Needed: Per pt's daughter, pt needs help with bathing, dressing, meal prep        Hand Dominance   Dominant Hand: Right    Extremity/Trunk Assessment   Upper Extremity Assessment Upper Extremity Assessment: Defer to OT evaluation    Lower Extremity Assessment Lower Extremity Assessment: Generalized weakness    Cervical / Trunk Assessment Cervical / Trunk Assessment: Other exceptions Cervical / Trunk Exceptions: forward head, rounded shoulders  Communication   Communication: No difficulties  Cognition Arousal/Alertness: Awake/alert Behavior During Therapy: Anxious Overall Cognitive Status: Impaired/Different from baseline Area of Impairment: Orientation;Attention;Following commands;Safety/judgement;Problem solving                 Orientation Level: Disoriented to;Place Current Attention Level: Sustained   Following Commands: Follows one step commands with increased time Safety/Judgement: Decreased awareness of safety;Decreased awareness of deficits   Problem Solving: Requires verbal cues;Requires tactile cues;Difficulty sequencing General Comments: disoriented to place until given options (hospital vs [facility]). Pt requires repeated cues to stay on task and pt asks multiple times "what do you want me to do?" after PT expresses mobility command.      General Comments      Exercises     Assessment/Plan    PT Assessment Patient needs continued PT services  PT Problem List Decreased strength;Decreased mobility;Decreased activity tolerance;Decreased balance;Decreased knowledge of use of DME;Pain;Decreased safety awareness;Decreased cognition;Cardiopulmonary status limiting activity       PT Treatment Interventions DME instruction;Therapeutic activities;Gait training;Therapeutic exercise;Patient/family education;Balance training;Functional mobility  training;Neuromuscular re-education    PT Goals (Current goals can be found in the Care Plan section)  Acute Rehab PT Goals PT Goal Formulation: With patient Time For Goal Achievement: 08/11/21 Potential to Achieve Goals: Fair    Frequency Min 3X/week   Barriers to discharge        Co-evaluation               AM-PAC PT "6 Clicks" Mobility  Outcome Measure Help needed turning from your back to your side while in a flat bed without using bedrails?: A Little Help needed moving from lying on your back to sitting on the side of a flat bed without using bedrails?: A Lot Help needed moving to and from a bed to a chair (including a wheelchair)?: A Lot Help needed standing up from a chair using your arms (e.g., wheelchair or bedside chair)?: A Little Help needed to walk in hospital room?: A Lot Help needed climbing 3-5 steps with a railing? : A Lot 6 Click Score: 14    End of Session Equipment Utilized During Treatment: Oxygen (4LO2) Activity Tolerance: Patient tolerated treatment well Patient left: in chair;with call bell/phone within reach;with nursing/sitter in room;with family/visitor present (RN in room, chair alarm pad placed but pt's family states they will be with pt and make sure she gets RN assist for back to bed, RN aware) Nurse Communication: Mobility status PT Visit Diagnosis: Other abnormalities of gait and mobility (R26.89)    Time: 6045-4098 PT Time Calculation (min) (ACUTE ONLY): 31 min   Charges:   PT Evaluation $PT Eval Low Complexity: 1 Low PT Treatments $Therapeutic Activity: 8-22 mins       Geriann Lafont S, PT DPT Acute Rehabilitation Services Pager  [place] 07/28/2021, 1:47 PM

## 2021-07-28 NOTE — Progress Notes (Signed)
[facility]  PROGRESS NOTE  Holly [name] YTK:[id number] DOB: [birth year] DOA: 07/23/2021 PCP: Harlan Stains, MD   Brief HPI:   75 year old female with history of COPD on home oxygen, chronic steroid therapy, lung cancer diagnosed 5 years ago on intermittent palliative XRT, hypothyroidism was brought to ED because of unresponsiveness.  Patient lives with her daughter.  Daughter found patient unresponsive at home and EMS was called.  Upon arrival to ED patient had a GCS of 3 so she was intubated.  CT head was negative but venous blood gas found the patient to have PCO2 of 95.6.  Chest x-ray did not show any acute pathology and blood glucose was normal. Patient was extubated on 07/26/2021 She was transferred to [facility] service on 07/28/2021   Subjective   Patient seen and examined, still pleasantly confused.  States that she is seeing snail in the room.   Assessment/Plan:    Acute on chronic hypoxemic and hypercarbic respiratory failure -Likely from underlying COPD, patient is chronically on home O2 3 L/min and chronic prednisone  -She has history of OSA, not on CPAP -Resolved, patient is off mechanical ventilation -Still requiring 5 L of oxygen -Continue to wean off O2 -Continue prednisone 20 mg p.o. 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 -S/p radiation right middle lobe -Right middle lobe mass increased from 10 mm to 2 x 1.7 x 2.4 mm -We will need to follow-up with  radiation oncology as outpatient  Peripheral vascular disease/chronic right leg ulcer -Wound care consulted  Hypothyroidism -Continue Synthroid  Acute on chronic anemia with history of GI bleed -EGD on 02/17/2021 showed nonbleeding ulcer, no evidence of active GI bleed during this admission -Transfused 1 unit PRBC -GI was consulted; she has h/o esophagitis and gastric ulcers -she was started on PPI twice a day,  -Daughter did not want EGD or colonoscopy at this time as per GI note  History of MRSA right elbow -Resulted in removal of hardware -MRSA screen negative  Goals of care -Patient daughter wants to discuss with palliative care -We will consult palliative care for goals of care discussion  Scheduled medications:    ALPRAZolam  0.25 mg Oral QHS   atorvastatin  20 mg Oral Daily   buPROPion  300 mg Oral Daily   chlorhexidine  15 mL Mouth Rinse BID   Chlorhexidine Gluconate Cloth  6 each Topical Daily   docusate sodium  100 mg Oral BID   donepezil  10 mg Oral QHS   feeding supplement  1 Container Oral TID BM   heparin injection (subcutaneous)  5,000 Units Subcutaneous Q8H   insulin aspart  0-9 Units Subcutaneous Q4H   levothyroxine  137 mcg Oral Q0600   losartan  50 mg Oral Daily   mouth rinse  15 mL Mouth Rinse q12n4p   metoprolol succinate  50 mg Oral Daily   pantoprazole  40 mg Oral BID   polyethylene glycol  17 g Oral Daily   predniSONE  20 mg Oral Q breakfast   sodium chloride flush  10-40 mL Intracatheter Q12H  Data Reviewed:   CBG:  Recent Labs  Lab 07/27/21 2331 07/28/21 0425 07/28/21 0808 07/28/21 1209 07/28/21 1651  GLUCAP 125* 90 108* 130* 144*    SpO2: 100 % O2 Flow Rate (L/min): 5 L/min FiO2 (%): 40 %    Vitals:   07/28/21 1000 07/28/21 1202 07/28/21 1338 07/28/21 1648  BP: (!) 193/66 (!) 207/75 (!) 150/67 (!) 186/60  Pulse: 68 71 83 65  Resp:   (!) 24 19  Temp:   98.6 F (37 C) (!) 96.9 F (36.1 C)  TempSrc:   Oral Axillary   SpO2:   100% 100%  Weight:      Height:         Intake/Output Summary (Last 24 hours) at 07/28/2021 1716 Last data filed at 07/27/2021 2000 Gross per 24 hour  Intake 22.36 ml  Output 1600 ml  Net -1577.64 ml    10/10 1901 - 10/12 0700 In: 399 [I.V.:222.2] Out: 2650 [Urine:2650]  Filed Weights   07/26/21 0349 07/27/21 0449 07/27/21 2142  Weight: 72.5 kg 71.2 kg 68.9 kg    Data Reviewed: Basic Metabolic Panel: Recent Labs  Lab 07/23/21 2027 07/23/21 2113 07/23/21 2200 07/24/21 0641 07/24/21 2036 07/25/21 0429 07/25/21 1700 07/26/21 0355 07/28/21 0221  NA 142   < > 145 142  --  141  --  139 140  K 4.5   < > 4.3 3.8  --  2.9*  --  3.5 3.9  CL 90*  --   --  95*  --  98  --  101 96*  CO2 >45*  --   --  35*  --  34*  --  32 38*  GLUCOSE 171*  --   --  109*  --  189*  --  204* 88  BUN 18  --   --  20  --  24*  --  27* 16  CREATININE 0.87  --   --  1.23*  --  1.02*  --  1.09* 0.74  CALCIUM 9.9  --   --  9.5  --  9.1  --  8.9 9.7  MG  --   --   --  1.8 1.7 2.5* 2.4 2.3  --   PHOS  --   --   --  1.2* 2.0* 4.4 3.3 3.3  --    < > = values in this interval not displayed.   Liver Function Tests: Recent Labs  Lab 07/23/21 2027 07/25/21 0429  AST 12* 20  ALT 9 12  ALKPHOS 92 59  BILITOT 0.6 0.7  PROT 6.7 5.4*  ALBUMIN 2.7* 2.1*   No results for input(s): LIPASE, AMYLASE in the last 168 hours. Recent Labs  Lab 07/23/21 2330  AMMONIA 82*   CBC: Recent Labs  Lab 07/23/21 2027 07/23/21 2113 07/27/21 0445 07/27/21 1125 07/27/21 1819 07/28/21 0221 07/28/21 0959  WBC 15.2*   < > 7.0 7.2 7.8 8.0 8.0  NEUTROABS 9.8*  --   --   --   --   --   --   HGB 9.4*   < > 8.7* 9.6* 9.7* 10.3* 9.9*  HCT 35.2*   < > 31.0* 32.8* 34.3* 35.4* 33.9*  MCV 104.8*   < > 100.0 97.9 98.6 96.7 95.2  PLT 209   < > 132* 123* 137* 139* 132*   < > = values in this interval not displayed.   Cardiac Enzymes: No results for input(s): CKTOTAL, CKMB, CKMBINDEX, TROPONINI in the  last 168  hours. BNP (last 3 results) Recent Labs    02/23/21 0502 03/22/21 2325 07/23/21 2202  BNP 53.4 48.0 147.2*    ProBNP (last 3 results) No results for input(s): PROBNP in the last 8760 hours.  CBG: Recent Labs  Lab 07/27/21 2331 07/28/21 0425 07/28/21 0808 07/28/21 1209 07/28/21 1651  GLUCAP 125* 90 108* 130* 144*       Radiology Reports  No results found.     Antibiotics: Anti-infectives (From admission, onward)    Start     Dose/Rate Route Frequency Ordered Stop   07/25/21 1300  vancomycin (VANCOREADY) IVPB 750 mg/150 mL  Status:  Discontinued        750 mg 150 mL/hr over 60 Minutes Intravenous Every 24 hours 07/24/21 1058 07/27/21 1046   07/24/21 1145  ceFEPIme (MAXIPIME) 2 g in sodium chloride 0.9 % 100 mL IVPB        2 g 200 mL/hr over 30 Minutes Intravenous Every 12 hours 07/24/21 1048     07/24/21 1145  vancomycin (VANCOREADY) IVPB 1500 mg/300 mL        1,500 mg 150 mL/hr over 120 Minutes Intravenous  Once 07/24/21 1048 07/25/21 0028   07/24/21 1000  rifaximin (XIFAXAN) tablet 550 mg  Status:  Discontinued        550 mg Per Tube 2 times daily 07/24/21 0543 07/24/21 1048         DVT prophylaxis: Heparin  Code Status: DNR  Family Communication: Discussed with patient's daughter at bedside   Consultants:   Procedures:     Objective    Physical Examination:   General-appears in no acute distress Heart-S1-S2, regular, no murmur auscultated Lungs-clear to auscultation bilaterally, no wheezing or crackles auscultated Abdomen-soft, nontender, no organomegaly Extremities-no edema in the lower extremities Neuro-alert, oriented x 2 no focal deficit noted  Status is: Inpatient  Dispo: The patient is from: Home              Anticipated d/c is to: To be decided              Anticipated d/c date is: 07/29/2021              Patient currently not stable for discharge  Barrier to discharge-awaiting palliative care discussion with  family  COVID-19 Labs  No results for input(s): DDIMER, FERRITIN, LDH, CRP in the last 72 hours.  Lab Results  Component Value Date   SARSCOV2NAA NEGATIVE 07/23/2021   [place] NEGATIVE 03/22/2021   [place] NEGATIVE 02/22/2021   [place] NEGATIVE 01/12/2021     Pressure Injury 02/16/21 Buttocks Right Stage 2 -  Partial thickness loss of dermis presenting as a shallow open injury with a red, pink wound bed without slough. (Active)  02/16/21 0230  Location: Buttocks  Location Orientation: Right  Staging: Stage 2 -  Partial thickness loss of dermis presenting as a shallow open injury with a red, pink wound bed without slough.  Wound Description (Comments):   Present on Admission: Yes        Recent Results (from the past 240 hour(s))  Culture, blood (Routine x 2)     Status: None   Collection Time: 07/23/21  8:27 PM   Specimen: BLOOD RIGHT ARM  Result Value Ref Range Status   Specimen Description BLOOD RIGHT ARM  Final   Special Requests   Final    BOTTLES DRAWN AEROBIC AND ANAEROBIC Blood Culture adequate volume   Culture   Final  NO GROWTH 5 DAYS Performed at [facility], [place] [address] [place], [place] [address]    Report Status 07/28/2021 FINAL  Final  Resp Panel by RT-PCR (Flu A&B, Covid) Nasopharyngeal Swab     Status: None   Collection Time: 07/23/21  8:33 PM   Specimen: Nasopharyngeal Swab; Nasopharyngeal(NP) swabs in vial transport medium  Result Value Ref Range Status   SARS Coronavirus 2 by RT PCR NEGATIVE NEGATIVE Final    Comment: (NOTE) SARS-CoV-2 target nucleic acids are NOT DETECTED.  The SARS-CoV-2 RNA is generally detectable in upper respiratory specimens during the acute phase of infection. The lowest concentration of SARS-CoV-2 viral copies this assay can detect is 138 copies/mL. A negative result does not preclude SARS-Cov-2 infection and should not be used as the sole basis for treatment or other patient management decisions.  A negative result may occur with  improper specimen collection/handling, submission of specimen other than nasopharyngeal swab, presence of viral mutation(s) within the areas targeted by this assay, and inadequate number of viral copies(<138 copies/mL). A negative result must be combined with clinical observations, patient history, and epidemiological information. The expected result is Negative.  Fact Sheet for Patients:  EntrepreneurPulse.com.au  Fact Sheet for Healthcare Providers:  IncredibleEmployment.be  This test is no t yet approved or cleared by the [place] FDA and  has been authorized for detection and/or diagnosis of SARS-CoV-2 by FDA under an Emergency Use Authorization (EUA). This EUA will remain  in effect (meaning this test can be used) for the duration of the COVID-19 declaration under Section 564(b)(1) of the Act, 21 U.S.C.section 360bbb-3(b)(1), unless the authorization is terminated  or revoked sooner.       Influenza A by PCR NEGATIVE NEGATIVE Final   Influenza B by PCR NEGATIVE NEGATIVE Final    Comment: (NOTE) The Xpert Xpress SARS-CoV-2/FLU/RSV plus assay is intended as an aid in the diagnosis of influenza from Nasopharyngeal swab specimens and should not be used as a sole basis for treatment. Nasal washings and aspirates are unacceptable for Xpert Xpress SARS-CoV-2/FLU/RSV testing.  Fact Sheet for Patients: EntrepreneurPulse.com.au  Fact Sheet for Healthcare Providers: IncredibleEmployment.be  This test is not yet approved or cleared by the [place] FDA and has been authorized for detection and/or diagnosis of SARS-CoV-2 by FDA under an Emergency Use Authorization (EUA). This EUA will remain in effect (meaning this test can be used) for the duration of the COVID-19 declaration under Section 564(b)(1) of the Act, 21 U.S.C. section 360bbb-3(b)(1), unless the authorization  is terminated or revoked.  Performed at [facility], [place] [address], [place], [place] [address]   MRSA Next Gen by PCR, Nasal     Status: None   Collection Time: 07/24/21  3:08 AM   Specimen: Nasal Mucosa; Nasal Swab  Result Value Ref Range Status   MRSA by PCR Next Gen NOT DETECTED NOT DETECTED Final    Comment: (NOTE) The GeneXpert MRSA Assay (FDA approved for NASAL specimens only), is one component of a comprehensive MRSA colonization surveillance program. It is not intended to diagnose MRSA infection nor to guide or monitor treatment for MRSA infections. Test performance is not FDA approved in patients less than 49 years old. Performed at [facility], [place] [address], [place], [place] [address]     [facility] If 7PM-7AM, please contact night-coverage at [email or web address], Office  [phone]   07/28/2021, 5:16 PM  LOS: 5 days

## 2021-07-29 DIAGNOSIS — A419 Sepsis, unspecified organism: Secondary | ICD-10-CM

## 2021-07-29 DIAGNOSIS — J9602 Acute respiratory failure with hypercapnia: Secondary | ICD-10-CM | POA: Diagnosis not present

## 2021-07-29 DIAGNOSIS — J9601 Acute respiratory failure with hypoxia: Secondary | ICD-10-CM | POA: Diagnosis not present

## 2021-07-29 DIAGNOSIS — Z7189 Other specified counseling: Secondary | ICD-10-CM | POA: Diagnosis not present

## 2021-07-29 DIAGNOSIS — J9621 Acute and chronic respiratory failure with hypoxia: Secondary | ICD-10-CM | POA: Diagnosis not present

## 2021-07-29 LAB — CBC
HCT: 32.4 % — ABNORMAL LOW (ref 36.0–46.0)
Hemoglobin: 9.5 g/dL — ABNORMAL LOW (ref 12.0–15.0)
MCH: 28.1 pg (ref 26.0–34.0)
MCHC: 29.3 g/dL — ABNORMAL LOW (ref 30.0–36.0)
MCV: 95.9 fL (ref 80.0–100.0)
Platelets: 135 10*3/uL — ABNORMAL LOW (ref 150–400)
RBC: 3.38 MIL/uL — ABNORMAL LOW (ref 3.87–5.11)
RDW: 16.2 % — ABNORMAL HIGH (ref 11.5–15.5)
WBC: 6.9 10*3/uL (ref 4.0–10.5)
nRBC: 0 % (ref 0.0–0.2)

## 2021-07-29 LAB — BASIC METABOLIC PANEL
Anion gap: 7 (ref 5–15)
BUN: 14 mg/dL (ref 8–23)
CO2: 37 mmol/L — ABNORMAL HIGH (ref 22–32)
Calcium: 9.5 mg/dL (ref 8.9–10.3)
Chloride: 97 mmol/L — ABNORMAL LOW (ref 98–111)
Creatinine, Ser: 0.78 mg/dL (ref 0.44–1.00)
GFR, Estimated: >60 mL/min (ref >60)
Glucose, Bld: 89 mg/dL (ref 70–99)
Potassium: 3.7 mmol/L (ref 3.5–5.1)
Sodium: 141 mmol/L (ref 135–145)

## 2021-07-29 LAB — GLUCOSE, CAPILLARY
Glucose-Capillary: 75 mg/dL (ref 70–99)
Glucose-Capillary: 112 mg/dL — ABNORMAL HIGH (ref 70–99)
Glucose-Capillary: 192 mg/dL — ABNORMAL HIGH (ref 70–99)
Glucose-Capillary: 254 mg/dL — ABNORMAL HIGH (ref 70–99)

## 2021-07-29 MED ORDER — FUROSEMIDE 40 MG PO TABS
40.0000 mg | ORAL_TABLET | Freq: Every day | ORAL | Status: DC
  Administered 2021-07-29 – 2021-08-02 (×5): 40 mg via ORAL
  Filled 2021-07-29 (×5): qty 1

## 2021-07-29 NOTE — Progress Notes (Signed)
Ok to add stop date of 7d to cefepime per Dr. Darrick Meigs.  Onnie Boer, PharmD, BCIDP, AAHIVP, CPP Infectious Disease Pharmacist 07/29/2021 1:33 PM

## 2021-07-29 NOTE — Progress Notes (Signed)
[facility]  PROGRESS NOTE  MERCADIES CO RDE:[id number] DOB: 09-01-1946 DOA: 07/23/2021 PCP: Harlan Stains, MD   Brief HPI:   75 year old female with history of COPD on home oxygen, chronic steroid therapy, lung cancer diagnosed 5 years ago on intermittent palliative XRT, hypothyroidism was brought to ED because of unresponsiveness.  Patient lives with her daughter.  Daughter found patient unresponsive at home and EMS was called.  Upon arrival to ED patient had a GCS of 3 so she was intubated.  CT head was negative but venous blood gas found the patient to have PCO2 of 95.6.  Chest x-ray did not show any acute pathology and blood glucose was normal. Patient was extubated on 07/26/2021 She was transferred to [facility] service on 07/28/2021   Subjective   Patient seen and examined, feels better this morning.   Assessment/Plan:    Acute on chronic hypoxemic and hypercarbic respiratory failure -Likely from underlying COPD, patient is chronically on home O2 3 L/min and chronic prednisone  -She has history of OSA, not on CPAP -Resolved, patient is off mechanical ventilation -Still requiring 5 L of oxygen -Continue to wean off O2 -Continue prednisone 20 mg p.o. daily  Acute on chronic diastolic CHF -Patient is 6 L positive fluid overload; currently requiring 3 L/min of oxygen -She is on oxygen liters per minute at home at baseline -We will restart home dose Lasix at 40 mg daily  Septic shock -Resolved, unclear etiology -Patient was started on vancomycin and cefepime -Vancomycin has been discontinued; continue cefepime -Chest x-ray negative   Acute metabolic encephalopathy -Secondary to hypercarbia, hypoxemia -CT head showed 8 mm meningioma -EEG showed no epileptiform discharges -Metabolic encephalopathy has resolved; back to baseline  Acute kidney injury -Creatinine was 1.23 on admission; improved to 0.74 today -Likely in setting of septic shock  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  Losartan 50 mg daily -Hydralazine 25 mg p.o. every 6 hours as needed for BP more than 160/100 -Lasix started today as above  Acute on chronic anemia with history of GI bleed -EGD on 02/17/2021 showed nonbleeding ulcer, no evidence of active GI bleed during this admission -Transfused 1 unit PRBC -GI was consulted; she has h/o esophagitis and gastric ulcers -she was started on PPI twice a day,  -Daughter did not want EGD or colonoscopy at this time as per GI note  History of MRSA right elbow -Resulted in removal of hardware -MRSA screen negative  Goals of care -Patient daughter wants to discuss with palliative care -We will consult palliative care for goals of care discussion  Scheduled medications:    ALPRAZolam  0.25 mg Oral QHS   atorvastatin  20 mg Oral Daily   buPROPion  300 mg Oral Daily   chlorhexidine  15 mL Mouth Rinse BID   Chlorhexidine Gluconate Cloth  6 each Topical Daily   docusate sodium  100 mg Oral BID   donepezil  10 mg Oral QHS   feeding supplement  1 Container Oral TID BM   heparin injection (subcutaneous)  5,000 Units Subcutaneous Q8H   levothyroxine  137 mcg Oral Q0600   losartan  50 mg Oral Daily  mouth rinse  15 mL Mouth Rinse q12n4p   metoprolol succinate  50 mg Oral Daily   pantoprazole  40 mg Oral BID   polyethylene glycol  17 g Oral Daily   predniSONE  20 mg Oral Q breakfast   sodium chloride flush  10-40 mL Intracatheter Q12H     Data Reviewed:   CBG:  Recent Labs  Lab 07/28/21 1651 07/28/21 1933 07/28/21 2317 07/29/21 0801 07/29/21 1210  GLUCAP 144* 112*  93 75 112*    SpO2: 93 % O2 Flow Rate (L/min): 3 L/min FiO2 (%): 40 %    Vitals:   07/29/21 0422 07/29/21 0500 07/29/21 0757 07/29/21 0937  BP: (!) 200/70 (!) 114/39 (!) 169/57 (!) 180/57  Pulse: 65 (!) 55 61 65  Resp: 19 (!) 24 (!) 21 20  Temp:   97.6 F (36.4 C) 97.7 F (36.5 C)  TempSrc:   Oral Oral  SpO2: 97% 97% 100% 93%  Weight:      Height:         Intake/Output Summary (Last 24 hours) at 07/29/2021 1519 Last data filed at 07/29/2021 1300 Gross per 24 hour  Intake 763 ml  Output 800 ml  Net -37 ml    10/11 1901 - 10/13 0700 In: 343 [P.O.:240; I.V.:3] Out: 1300 [Urine:1300]  Filed Weights   07/27/21 0449 07/27/21 2142 07/29/21 0400  Weight: 71.2 kg 68.9 kg 68.4 kg    Data Reviewed: Basic Metabolic Panel: Recent Labs  Lab 07/24/21 0641 07/24/21 2036 07/25/21 0429 07/25/21 1700 07/26/21 0355 07/28/21 0221 07/29/21 1207  NA 142  --  141  --  139 140 141  K 3.8  --  2.9*  --  3.5 3.9 3.7  CL 95*  --  98  --  101 96* 97*  CO2 35*  --  34*  --  32 38* 37*  GLUCOSE 109*  --  189*  --  204* 88 89  BUN 20  --  24*  --  27* 16 14  CREATININE 1.23*  --  1.02*  --  1.09* 0.74 0.78  CALCIUM 9.5  --  9.1  --  8.9 9.7 9.5  MG 1.8 1.7 2.5* 2.4 2.3  --   --   PHOS 1.2* 2.0* 4.4 3.3 3.3  --   --    Liver Function Tests: Recent Labs  Lab 07/23/21 2027 07/25/21 0429  AST 12* 20  ALT 9 12  ALKPHOS 92 59  BILITOT 0.6 0.7  PROT 6.7 5.4*  ALBUMIN 2.7* 2.1*   No results for input(s): LIPASE, AMYLASE in the last 168 hours. Recent Labs  Lab 07/23/21 2330  AMMONIA 82*   CBC: Recent Labs  Lab 07/23/21 2027 07/23/21 2113 07/27/21 1125 07/27/21 1819 07/28/21 0221 07/28/21 0959 07/29/21 0256  WBC 15.2*   < > 7.2 7.8 8.0 8.0 6.9  NEUTROABS 9.8*  --   --   --   --   --   --   HGB 9.4*   < > 9.6* 9.7* 10.3* 9.9* 9.5*  HCT 35.2*   < > 32.8* 34.3* 35.4* 33.9* 32.4*  MCV 104.8*   < > 97.9 98.6 96.7 95.2 95.9  PLT 209   < > 123* 137* 139* 132* 135*   < >  = values in this interval not displayed.   Cardiac Enzymes: No results for input(s): CKTOTAL, CKMB, CKMBINDEX, TROPONINI in the last 168 hours. BNP (last 3 results) Recent Labs    02/23/21  0502 03/22/21 2325 07/23/21 2202  BNP 53.4 48.0 147.2*    ProBNP (last 3 results) No results for input(s): PROBNP in the last 8760 hours.  CBG: Recent Labs  Lab 07/28/21 1651 07/28/21 1933 07/28/21 2317 07/29/21 0801 07/29/21 1210  GLUCAP 144* 112* 93 75 112*       Radiology Reports  No results found.     Antibiotics: Anti-infectives (From admission, onward)    Start     Dose/Rate Route Frequency Ordered Stop   07/25/21 1300  vancomycin (VANCOREADY) IVPB 750 mg/150 mL  Status:  Discontinued        750 mg 150 mL/hr over 60 Minutes Intravenous Every 24 hours 07/24/21 1058 07/27/21 1046   07/24/21 1145  ceFEPIme (MAXIPIME) 2 g in sodium chloride 0.9 % 100 mL IVPB        2 g 200 mL/hr over 30 Minutes Intravenous Every 12 hours 07/24/21 1048 07/30/21 2359   07/24/21 1145  vancomycin (VANCOREADY) IVPB 1500 mg/300 mL        1,500 mg 150 mL/hr over 120 Minutes Intravenous  Once 07/24/21 1048 07/25/21 0028   07/24/21 1000  rifaximin (XIFAXAN) tablet 550 mg  Status:  Discontinued        550 mg Per Tube 2 times daily 07/24/21 0543 07/24/21 1048         DVT prophylaxis: Heparin  Code Status: DNR  Family Communication: Discussed with patient's daughter at bedside   Consultants:   Procedures:     Objective    Physical Examination:   General-appears in no acute distress Heart-S1-S2, regular, no murmur auscultated Lungs-decreased breath sounds bilaterally Abdomen-soft, nontender, no organomegaly Extremities-no edema in the lower extremities Neuro-alert, oriented x3, no focal deficit noted  Status is: Inpatient  Dispo: The patient is from: Home              Anticipated d/c is to: To be decided              Anticipated d/c date is: 07/29/2021               Patient currently not stable for discharge  Barrier to discharge-awaiting palliative care discussion with family  COVID-19 Labs  No results for input(s): DDIMER, FERRITIN, LDH, CRP in the last 72 hours.  Lab Results  Component Value Date   SARSCOV2NAA NEGATIVE 07/23/2021   [place] NEGATIVE 03/22/2021   [place] NEGATIVE 02/22/2021   [place] NEGATIVE 01/12/2021     Pressure Injury 02/16/21 Buttocks Right Stage 2 -  Partial thickness loss of dermis presenting as a shallow open injury with a red, pink wound bed without slough. (Active)  02/16/21 0230  Location: Buttocks  Location Orientation: Right  Staging: Stage 2 -  Partial thickness loss of dermis presenting as a shallow open injury with a red, pink wound bed without slough.  Wound Description (Comments):   Present on Admission: Yes        Recent Results (from the past 240 hour(s))  Culture, blood (Routine x 2)     Status: None   Collection Time: 07/23/21  8:27 PM   Specimen: BLOOD RIGHT ARM  Result Value Ref Range Status   Specimen Description BLOOD RIGHT ARM  Final   Special Requests   Final    BOTTLES DRAWN AEROBIC AND ANAEROBIC Blood Culture adequate volume   Culture   Final    NO GROWTH 5 DAYS Performed at [facility], [address] [place], [place] [address]  Report Status 07/28/2021 FINAL  Final  Resp Panel by RT-PCR (Flu A&B, Covid) Nasopharyngeal Swab     Status: None   Collection Time: 07/23/21  8:33 PM   Specimen: Nasopharyngeal Swab; Nasopharyngeal(NP) swabs in vial transport medium  Result Value Ref Range Status   SARS Coronavirus 2 by RT PCR NEGATIVE NEGATIVE Final    Comment: (NOTE) SARS-CoV-2 target nucleic acids are NOT DETECTED.  The SARS-CoV-2 RNA is generally detectable in upper respiratory specimens during the acute phase of infection. The lowest concentration of SARS-CoV-2 viral copies this assay can detect is 138 copies/mL. A negative result does not preclude  SARS-Cov-2 infection and should not be used as the sole basis for treatment or other patient management decisions. A negative result may occur with  improper specimen collection/handling, submission of specimen other than nasopharyngeal swab, presence of viral mutation(s) within the areas targeted by this assay, and inadequate number of viral copies(<138 copies/mL). A negative result must be combined with clinical observations, patient history, and epidemiological information. The expected result is Negative.  Fact Sheet for Patients:  EntrepreneurPulse.com.au  Fact Sheet for Healthcare Providers:  IncredibleEmployment.be  This test is no t yet approved or cleared by the [place] FDA and  has been authorized for detection and/or diagnosis of SARS-CoV-2 by FDA under an Emergency Use Authorization (EUA). This EUA will remain  in effect (meaning this test can be used) for the duration of the COVID-19 declaration under Section 564(b)(1) of the Act, 21 U.S.C.section 360bbb-3(b)(1), unless the authorization is terminated  or revoked sooner.       Influenza A by PCR NEGATIVE NEGATIVE Final   Influenza B by PCR NEGATIVE NEGATIVE Final    Comment: (NOTE) The Xpert Xpress SARS-CoV-2/FLU/RSV plus assay is intended as an aid in the diagnosis of influenza from Nasopharyngeal swab specimens and should not be used as a sole basis for treatment. Nasal washings and aspirates are unacceptable for Xpert Xpress SARS-CoV-2/FLU/RSV testing.  Fact Sheet for Patients: EntrepreneurPulse.com.au  Fact Sheet for Healthcare Providers: IncredibleEmployment.be  This test is not yet approved or cleared by the [place] FDA and has been authorized for detection and/or diagnosis of SARS-CoV-2 by FDA under an Emergency Use Authorization (EUA). This EUA will remain in effect (meaning this test can be used) for the duration of  the COVID-19 declaration under Section 564(b)(1) of the Act, 21 U.S.C. section 360bbb-3(b)(1), unless the authorization is terminated or revoked.  Performed at [facility], [place] [address] [place], [place] [address]   MRSA Next Gen by PCR, Nasal     Status: None   Collection Time: 07/24/21  3:08 AM   Specimen: Nasal Mucosa; Nasal Swab  Result Value Ref Range Status   MRSA by PCR Next Gen NOT DETECTED NOT DETECTED Final    Comment: (NOTE) The GeneXpert MRSA Assay (FDA approved for NASAL specimens only), is one component of a comprehensive MRSA colonization surveillance program. It is not intended to diagnose MRSA infection nor to guide or monitor treatment for MRSA infections. Test performance is not FDA approved in patients less than 32 years old. Performed at [facility], [place] [address], [place], [place] [address]     [facility] If 7PM-7AM, please contact night-coverage at [email or web address], Office  [phone]   07/29/2021, 3:19 PM  LOS: 6 days

## 2021-07-29 NOTE — Consult Note (Signed)
   [facility] CM Inpatient Consult   07/29/2021  Holly [name] 12-06-1945 [id number]  [place] [facility] [ACO] Patient: Medicare CMS DCE  Primary Care Provider:  Harlan Stains, MD [facility] Physician, [facility], this provider is listed for the Transition of Care [TOC] follow up and appointments   Patient screened for hospitalization with noted high risk score for unplanned readmission risk and to assess for potential [facility] service needs for post hospital transition.  Review of patient's medical record reveals patient is has had a history with [facility] Palliative team with follow up attempts in prior to admission.  MD progress notes reviewed for a palliative consult. Patient also is noted to have [facility] as well.  Plan:  Continue to follow progress and disposition to assess for post hospital care management needs.  Will refer if post hospital care management needs are known, will follow recommendations of palliative consult, if palliative consult is done.  For questions contact:   Natividad Brood, RN BSN [facility]iaison  [phone] business mobile phone Toll free office [phone]  Fax number: [phone] [email or web address]

## 2021-07-29 NOTE — NC FL2 (Signed)
MEDICAID FL2 LEVEL OF CARE SCREENING TOOL     IDENTIFICATION  Patient Name: Holly [name] Birthdate: [birth year] Sex: female Admission Date (Current Location): 07/23/2021  [facility] and [place] Number:  Herbalist and Address:  [facility], [place] [address], [place],  [address]      Provider Number: [id number]  Attending Physician Name and Address:  Oswald Hillock, MD  Relative Name and Phone Number:  Toniann Fail [phone]    Current Level of Care: Hospital Recommended Level of Care: [place] Prior Approval Number:    Date Approved/Denied:   PASRR Number:    Discharge Plan: SNF    Current Diagnoses: Patient Active Problem List   Diagnosis Date Noted   Encounter for central line placement    Acute on chronic respiratory failure ([place]) 07/23/2021   Hypotension 02/23/2021   Hypokalemia 02/23/2021   Acute renal failure superimposed on stage 3a chronic kidney disease ([place]) 02/22/2021   GI bleed 02/16/2021   ABLA (acute blood loss anemia) 02/16/2021   Diverticulitis 02/16/2021   Current chronic use of systemic steroids 02/16/2021   Pressure injury of skin 02/16/2021   Olecranon bursitis of right elbow 10/02/2020   Ulna, olecranon process fracture, right, closed, with routine healing, subsequent encounter 10/01/2020   Primary malignant neoplasm of left upper lobe of lung ([place]) 07/09/2019   Closed fracture of right olecranon process 12/04/2017   Cough 11/11/2015   Chronic respiratory failure with hypoxia ([place]) 11/11/2014   Cigarette smoker 10/16/2014   Multinodular thyroid 07/30/2014   Neoplasm of uncertain behavior of thyroid gland, 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   COPD exacerbation ([place]) 02/11/2013   Pedal edema 02/11/2013   COPD GOLD III  06/11/2010   CARCINOMA, BASAL CELL, 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   HYPERLIPIDEMIA, MIXED 05/13/2010   HYPERCALCEMIA 05/13/2010   ANXIETY 05/13/2010   Essential hypertension 05/13/2010    CERVICALGIA 05/13/2010   OSTEOPENIA 05/13/2010   URINARY INCONTINENCE 05/13/2010   CARCINOMA, BASAL CELL, 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    Orientation RESPIRATION BLADDER Height & Weight     Self, Time, Situation, Place  O2 Incontinent Weight: 68.4 kg Height:  5\' 4"  (162.6 cm)  BEHAVIORAL SYMPTOMS/MOOD NEUROLOGICAL BOWEL NUTRITION STATUS     (n/a) Continent Diet  AMBULATORY STATUS COMMUNICATION OF NEEDS Skin   Limited Assist Verbally Bruising, Skin abrasions, PU Stage and Appropriate Care (abrasion hand/leg right lower with foam dressing- bruising to arm/ abdomen/ chest bilateral)   PU Stage 2 Dressing: No Dressing (right buttocks)                   Personal Care Assistance Level of Assistance  Bathing, Feeding, Dressing Bathing Assistance: Limited assistance Feeding assistance: Independent (set up) Dressing Assistance: Limited assistance     Functional Limitations Info  Sight, Hearing, Speech Sight Info: Adequate Hearing Info: Adequate Speech Info: Adequate    SPECIAL CARE FACTORS FREQUENCY  PT (By licensed PT), OT (By licensed OT)     PT Frequency: 5X OT Frequency: 5X            Contractures Contractures Info: Not present    Additional Factors Info  Code Status, Psychotropic, Allergies, Insulin Sliding Scale, Isolation Precautions, Suctioning Needs Code Status Info: DNR Allergies Info: Amlodipine Besylate, Codeine, Oxycodone-acetaminophen Psychotropic Info: see d/c summary for psychotropic info Insulin Sliding Scale Info: see d/c summary for sliding scale info Isolation Precautions Info: n/a Suctioning Needs: n/a   Current Medications (07/29/2021):  This is the current hospital  active medication list Current Facility-Administered Medications  Medication Dose Route Frequency Provider Last Rate Last Admin   0.9 %  sodium chloride infusion  250 mL Intravenous Continuous Anders Simmonds, MD   Stopped at 07/27/21 1814   ALPRAZolam Duanne Moron) tablet 0.25 mg  0.25 mg Oral  QHS Audria Nine, DO   0.25 mg at 07/28/21 2153   ALPRAZolam Duanne Moron) tablet 0.5 mg  0.5 mg Oral BID PRN Audria Nine, DO   0.5 mg at 07/29/21 1318   atorvastatin (LIPITOR) tablet 20 mg  20 mg Oral Daily Audria Nine, DO   20 mg at 07/29/21 0936   buPROPion (WELLBUTRIN XL) 24 hr tablet 300 mg  300 mg Oral Daily Audria Nine, DO   300 mg at 07/29/21 0936   ceFEPIme (MAXIPIME) 2 g in sodium chloride 0.9 % 100 mL IVPB  2 g Intravenous Q12H Pham, Minh Q, RPH-CPP 200 mL/hr at 07/29/21 0953 2 g at 07/29/21 0953   chlorhexidine (PERIDEX) 0.12 % solution 15 mL  15 mL Mouth Rinse BID Audria Nine, DO   15 mL at 07/29/21 7371   Chlorhexidine Gluconate Cloth 2 % PADS 6 each  6 each Topical Daily Margaretha Seeds, MD   6 each at 07/29/21 0959   docusate sodium (COLACE) capsule 100 mg  100 mg Oral BID Audria Nine, DO   100 mg at 07/29/21 0626   donepezil (ARICEPT) tablet 10 mg  10 mg Oral QHS Audria Nine, DO   10 mg at 07/28/21 2153   feeding supplement (BOOST / RESOURCE BREEZE) liquid 1 Container  1 Container Oral TID BM Audria Nine, DO   1 Container at 07/29/21 1000   heparin injection 5,000 Units  5,000 Units Subcutaneous Q8H Audria Nine, DO   5,000 Units at 07/29/21 9485   hydrALAZINE (APRESOLINE) tablet 25 mg  25 mg Oral Q6H PRN Oswald Hillock, MD   25 mg at 07/29/21 0427   ipratropium-albuterol (DUONEB) 0.5-2.5 (3) MG/3ML nebulizer solution 3 mL  3 mL Nebulization Q6H PRN Oswald Hillock, MD   3 mL at 07/29/21 0947   levothyroxine (SYNTHROID) tablet 137 mcg  137 mcg Oral Q0600 Audria Nine, DO   137 mcg at 07/29/21 0523   losartan (COZAAR) tablet 50 mg  50 mg Oral Daily Oswald Hillock, MD   50 mg at 07/29/21 4627   MEDLINE mouth rinse  15 mL Mouth Rinse q12n4p Audria Nine, DO   15 mL at 07/29/21 1225   metoprolol succinate (TOPROL-XL) 24 hr tablet 50 mg  50 mg Oral Daily Audria Nine, DO   50 mg at 07/29/21 0350   naloxone [facility]) injection 0.4  mg  0.4 mg Intravenous PRN Audria Nine, DO   0.4 mg at 07/26/21 1600   pantoprazole (PROTONIX) EC tablet 40 mg  40 mg Oral BID Audria Nine, DO   40 mg at 07/29/21 0938   polyethylene glycol (MIRALAX / GLYCOLAX) packet 17 g  17 g Oral Daily Audria Nine, DO   17 g at 07/27/21 1101   polyethylene glycol (MIRALAX / GLYCOLAX) packet 17 g  17 g Oral Daily PRN Audria Nine, DO       predniSONE (DELTASONE) tablet 20 mg  20 mg Oral Q breakfast Audria Nine, DO   20 mg at 07/29/21 1829   sodium chloride 0.9 % bolus 1,000 mL  1,000 mL Intravenous Once Anders Simmonds, MD       sodium chloride flush (NS) 0.9 % injection  10-40 mL  10-40 mL Intracatheter Q12H [place] Dragon, MD   10 mL at 07/29/21 0955   sodium chloride flush (NS) 0.9 % injection 10-40 mL  10-40 mL Intracatheter PRN [place] Dragon, MD   10 mL at 07/27/21 2019     Discharge Medications: Please see discharge summary for a list of discharge medications.  Relevant Imaging Results:  Relevant Lab Results:   Additional Information SS# [id number]  Angelita Ingles, RN

## 2021-07-29 NOTE — Consult Note (Signed)
Consultation Note Date: 07/29/2021   Patient Name: Holly [name]  DOB: May 24, 1946  MRN: [id number]  Age / Sex: 75 y.o., female  PCP: Harlan Stains, MD Referring Physician: Oswald Hillock, MD  Reason for Consultation: Establishing goals of care  HPI/Patient Profile: 75 y.o. female  with past medical history of COPD on 3L home Oxygen and chronic steroid therapy, lung cancer diagnosed 5 years ago on intermittent palliative XRT, hypothyroidism admitted on 07/23/2021 with acute on chronic hypoxemic and hypercarbic respiratory failure after being found unresponsive at home.   Clinical Assessment and Goals of Care:  I have reviewed medical records including EPIC notes, labs and imaging, received report from RN, assessed the patient and then met at the bedside along with her daughter Holly [name] to discuss diagnosis, prognosis, GOC, EOL wishes, disposition and options.  I introduced Palliative Medicine as specialized medical care for people living with serious illness. It focuses on providing relief from the symptoms and stress of a serious illness. The goal is to improve quality of life for both the patient and the family.  We discussed a brief life review of the patient and then focused on their current illness. Holly [name] is from [place]. She has a daughter Holly [name], her primary caregiver) and a son who lives in [place]. She has been married 3 times and is widowed. She has 2 grandchildren, 2 great-grandchildren and 1 more on the way. She is retired. Holly [name] shares that she needs help with bathing and dressing at home. She ambulates with a walker and also has a wheelchair. Holly [name] feels short of breath quickly while walking and this causes quite a bit of fear and anxiety for her. She feels that she recovers well when able to rest. Holly [name] and Holly [name] understand that patient's COPD is quite advanced and complicated by lung cancer. We discussed  the risk of recurrent exacerbations and disease progression. Discussed the importance of planning for best case and worst case scenarios.   I attempted to elicit values and goals of care important to the patient.   Holly [name] shares that her priority is to ensure her mother does not suffer and remains peaceful. Holly [name] states that she has a lot to think about and she is uncertain whether she would ever wish to return to the hospital if she had another acute decline. Her current goal is to participate in rehab and strengthening to return home successfully.  The difference between aggressive medical intervention and comfort care was considered in light of the patient's goals of care.   A MOST form was introduced. Advanced directives, concepts specific to code status, artifical feeding and hydration, and rehospitalization were considered and discussed.  Hospice and Palliative Care services outpatient were explained and offered.  Discussed the importance of continued conversation with family and the medical providers regarding overall plan of care and treatment options, ensuring decisions are within the context of the patient's values and GOCs.    Questions and concerns were addressed.  Hard Choices booklet left for review. The family was  encouraged to call with questions or concerns.  PMT will continue to support holistically.   NEXT OF KIN are patient's daughter and son. She would name daughter Holly [name] as her HCPOA. No HCPOA currently on file.    SUMMARY OF RECOMMENDATIONS   -DNR confirmed -Continue current care; patient is hopeful for CIR and is also willing to attempt short-term stay at SNF for rehab -Outpatient palliative care referral when ready for discharge -Psychosocial and emotional support provided -Spiritual care consult at patient's request -PMT will continue to follow for [place] discussions, MOST form/AD completion as appropriate  Palliative Prophylaxis:  Delirium Protocol  Additional  Recommendations (Limitations, Scope, Preferences): Full Scope Treatment  Psycho-social/Spiritual:  Desire for further Chaplaincy support:yes Additional Recommendations: Caregiving  Support/Resources, Education on Hospice, and Referral to [employer]   Prognosis:  Unable to determine  Discharge Planning: To Be Determined      Primary Diagnoses: Present on Admission:  Acute on chronic respiratory failure ([place])   I have reviewed the medical record, interviewed the patient and family, and examined the patient. The following aspects are pertinent.  Past Medical History:  Diagnosis Date   Anxiety    Asthma    Basal cell carcinoma    lung cancer.  Skin cancer- basal - nose removed   Bruises easily    Cataract    Cervicalgia    COPD (chronic obstructive pulmonary disease) (HCC)    emphysema and COPD   Dyspnea    uses O2 only when needed. Uses 2 L    Foot swelling    left- has shown to Drs.   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   Hypercalcemia    Hypertension    Hypothyroidism    Mixed hyperlipidemia    Pneumonia    Pulmonary nodule    Sleep apnea    does not wear cpap   Thyroid neoplasm    Urinary incontinence    Wears dentures    Wears glasses    Social History   Socioeconomic History   Marital status: Widowed    Spouse name: Not on file   Number of children: 2   Years of education: Not on file   Highest education level: Not on file  Occupational History   Occupation: [employer]  Tobacco Use   Smoking status: Former    Packs/day: 1.00    Years: 30.00    Pack years: 30.00    Types: Cigarettes    Quit date: 02/24/2015    Years since quitting: 6.4   Smokeless tobacco: Never  Vaping Use   Vaping Use: Former  Substance and Sexual Activity   Alcohol use: Yes    Alcohol/week: 0.0 standard drinks    Comment: very seldom   Drug use: Never   Sexual activity: Not Currently  Other Topics Concern    Not on file  Social History Narrative   Not on file   Social Determinants of Health   Financial Resource Strain: Not on file  Food Insecurity: Not on file  Transportation Needs: Not on file  Physical Activity: Not on file  Stress: Not on file  Social Connections: Not on file   Family History  Problem Relation Age of Onset   Coronary artery disease Father    Hypertension Father    Asthma Father    Allergies Father    Hypertension Mother    Lung cancer Mother  Scheduled Meds:  ALPRAZolam  0.25 mg Oral QHS   atorvastatin  20 mg Oral Daily   buPROPion  300 mg Oral Daily   chlorhexidine  15 mL Mouth Rinse BID   Chlorhexidine Gluconate Cloth  6 each Topical Daily   docusate sodium  100 mg Oral BID   donepezil  10 mg Oral QHS   feeding supplement  1 Container Oral TID BM   furosemide  40 mg Oral Daily   heparin injection (subcutaneous)  5,000 Units Subcutaneous Q8H   levothyroxine  137 mcg Oral Q0600   losartan  50 mg Oral Daily   mouth rinse  15 mL Mouth Rinse q12n4p   metoprolol succinate  50 mg Oral Daily   pantoprazole  40 mg Oral BID   polyethylene glycol  17 g Oral Daily   predniSONE  20 mg Oral Q breakfast   sodium chloride flush  10-40 mL Intracatheter Q12H   Continuous Infusions:  sodium chloride Stopped (07/27/21 1814)   ceFEPime (MAXIPIME) IV 200 mL/hr at 07/29/21 1500   sodium chloride     PRN Meds:.ALPRAZolam, hydrALAZINE, ipratropium-albuterol, naLOXone (NARCAN)  injection, polyethylene glycol, sodium chloride flush Medications Prior to Admission:  Prior to Admission medications   Medication Sig Start Date End Date Taking? Authorizing Provider  ALPRAZolam Duanne Moron) 0.5 MG tablet Take 0.5 mg by mouth 2 (two) times daily as needed for anxiety. 09/14/20  Yes [provider]  atorvastatin (LIPITOR) 20 MG tablet Take 20 mg by mouth at bedtime.    Yes [provider]  buPROPion (WELLBUTRIN XL) 300 MG 24 hr tablet Take 300 mg by mouth every  morning. 07/08/21  Yes [provider]  busPIRone (BUSPAR) 30 MG tablet Take 15 mg by mouth 2 (two) times daily.   Yes [provider]  donepezil (ARICEPT) 10 MG tablet Take 10 mg by mouth at bedtime. 12/17/20  Yes [provider]  Fluticasone-Umeclidin-Vilant 100-62.5-25 MCG/INH AEPB Inhale 1 puff into the lungs daily. Trelegy   Yes [provider]  furosemide (LASIX) 40 MG tablet Take 40 mg by mouth daily. 07/08/21  Yes [provider]  ipratropium-albuterol (DUONEB) 0.5-2.5 (3) MG/3ML SOLN Inhale 3 mLs into the lungs every 4 (four) hours as needed (respiratory issues.). 01/29/20  Yes [provider]  levothyroxine (SYNTHROID) 137 MCG tablet Take 137 mcg by mouth daily before breakfast. 09/22/20  Yes [provider]  metoprolol succinate (TOPROL-XL) 50 MG 24 hr tablet Take 50 mg by mouth daily. Take with or immediately following a meal.   Yes [provider]  mirtazapine (REMERON) 15 MG tablet Take 15 mg by mouth at bedtime. 07/08/21  Yes [provider]  nystatin (MYCOSTATIN) 100000 UNIT/ML suspension Take 5 mLs by mouth 4 (four) times daily. Swish and swallow   Yes [provider]  pantoprazole (PROTONIX) 40 MG tablet Take 1 tablet (40 mg total) by mouth 2 (two) times daily. 02/18/21 07/24/21 Yes Dessa Phi, DO  potassium chloride (KLOR-CON) 10 MEQ tablet Take 10 mEq by mouth 2 (two) times daily. 03/08/21  Yes [provider]  predniSONE (DELTASONE) 20 MG tablet Take 20 mg by mouth daily. 07/08/21  Yes [provider]  VENTOLIN HFA 108 (90 Base) MCG/ACT inhaler INHALE 2 PUFFS INTO THE LUNGS EVERY 4 HOURS AS NEEDED FOR WHEEZING ORSHORTNESS OF BREATH Patient taking differently: Inhale 2 puffs into the lungs every 4 (four) hours as needed for wheezing or shortness of breath. 06/29/17  Yes Parrett, Fonnie Mu, NP  furosemide (LASIX) 20 MG tablet Take 1 tablet (20 mg total) by mouth daily. Patient not  taking: Reported on 07/24/2021 03/23/21   Petrucelli, Aldona Bar R, PA-C   Allergies  Allergen Reactions   Amlodipine Besylate Swelling    Edema   Codeine Nausea And Vomiting   Oxycodone-Acetaminophen Nausea And Vomiting   Review of Systems  All other systems reviewed and are negative.  Physical Exam Vitals and nursing note reviewed.  Constitutional:      General: She is not in acute distress.    Interventions: Nasal cannula in place.  Cardiovascular:     Rate and Rhythm: Normal rate.  Pulmonary:     Effort: Pulmonary effort is normal. No respiratory distress.  Neurological:     Mental Status: She is alert. She is confused.  Psychiatric:        Behavior: Behavior is cooperative.    Vital Signs: BP (!) 168/52 (BP Location: Left Arm)   Pulse 68   Temp 97.9 F (36.6 C) (Oral)   Resp (!) 21   Ht _0  (1.626 m)   Wt 68.4 kg   SpO2 96%   BMI 25.88 kg/m  Pain Scale: 0-10   Pain Score: 0-No pain   SpO2: SpO2: 96 % O2 Device:SpO2: 96 % O2 Flow Rate: .O2 Flow Rate (L/min): 3 L/min  IO: Intake/output summary:  Intake/Output Summary (Last 24 hours) at 07/29/2021 1613 Last data filed at 07/29/2021 1606 Gross per 24 hour  Intake 1099 ml  Output 1200 ml  Net -101 ml    LBM: Last BM Date: 07/28/21 Baseline Weight: Weight: 74.8 kg Most recent weight: Weight: 68.4 kg     Palliative Assessment/Data:     Time In: 2:30pm Time Out: 3:40pm Time Total: 70 minutes Greater than 50% of this time was spent in counseling and coordinating care related to the above assessment and plan.  Dorthy Cooler, PA-C Palliative Medicine Team Team phone # [phone]  Thank you for allowing the Palliative Medicine Team to assist in the care of this patient. Please utilize secure chat with additional questions, if there is no response within 30 minutes please call the above phone number.  Palliative Medicine Team providers are available by phone from 7am to 7pm daily and can be reached  through the team cell phone.  Should this patient require assistance outside of these hours, please call the patient's attending physician.

## 2021-07-30 DIAGNOSIS — Z7189 Other specified counseling: Secondary | ICD-10-CM

## 2021-07-30 DIAGNOSIS — J9602 Acute respiratory failure with hypercapnia: Secondary | ICD-10-CM | POA: Diagnosis not present

## 2021-07-30 DIAGNOSIS — J9601 Acute respiratory failure with hypoxia: Secondary | ICD-10-CM | POA: Diagnosis not present

## 2021-07-30 LAB — BASIC METABOLIC PANEL
Anion gap: 7 (ref 5–15)
BUN: 15 mg/dL (ref 8–23)
CO2: 40 mmol/L — ABNORMAL HIGH (ref 22–32)
Calcium: 9.3 mg/dL (ref 8.9–10.3)
Chloride: 94 mmol/L — ABNORMAL LOW (ref 98–111)
Creatinine, Ser: 0.74 mg/dL (ref 0.44–1.00)
GFR, Estimated: >60 mL/min (ref >60)
Glucose, Bld: 140 mg/dL — ABNORMAL HIGH (ref 70–99)
Potassium: 3.1 mmol/L — ABNORMAL LOW (ref 3.5–5.1)
Sodium: 141 mmol/L (ref 135–145)

## 2021-07-30 LAB — GLUCOSE, CAPILLARY
Glucose-Capillary: 163 mg/dL — ABNORMAL HIGH (ref 70–99)
Glucose-Capillary: 209 mg/dL — ABNORMAL HIGH (ref 70–99)
Glucose-Capillary: 222 mg/dL — ABNORMAL HIGH (ref 70–99)
Glucose-Capillary: 128 mg/dL — ABNORMAL HIGH (ref 70–99)

## 2021-07-30 MED ORDER — POTASSIUM CHLORIDE CRYS ER 20 MEQ PO TBCR
40.0000 meq | EXTENDED_RELEASE_TABLET | ORAL | Status: AC
Start: 2021-07-30 — End: 2021-07-30
  Administered 2021-07-30 (×2): 40 meq via ORAL
  Filled 2021-07-30 (×2): qty 2

## 2021-07-30 NOTE — Progress Notes (Signed)
Physical Therapy Treatment Patient Details Name: EVETTE DICLEMENTE MRN: [id number] DOB: December 27, 1945 Today's Date: 07/30/2021   History of Present Illness 75 yo female [facility]7 with unresponsiveness, found by daughter, arrives with GCS of 3. Pt with working diagnoses of acute on chronic respiratory failure, septic shock, acute metabolic encephalopathy. ETT 10/7-10/10. PMH includes COPD on home oxygen, hypertension, reported lung cancer on palliative radiation, prior GIB, anxiety, urinary incontinence, cervicalgia, anxiety, osteopenia, thyroidectomy 2015, ORIF R elbow fx 2019.    PT Comments    Pt remains significantly limited by fatigue. Upon PT arrival pt in chair and requesting to return to bed. Pt is able to transfer and perform most bed mobility with assistance for safety and line management. Pt declines attempts at ambulation due to fatigue. PT and pt discuss goals for therapy and rehab-related prognosis. PT reiterates the greatest barrier to functional improvement being the patients progressive chronic pulmonary conditions, limiting her tolerance for therapies. PT provides education on why patient would likely not be able to tolerate CIR placement, which pt accepts well. PT then provides discussion on pros and cons of discharge home with HHPT and assistance from family vs discharge to SNF. Pt expresses that she does not want to be a burden on her daughter if discharged home. Pt continues to express the desire to discharge to SNF in an attempt to make functional improvements prior to returning home. Pt does seem to have some poor recall of medical prognosis during this discussion and may not have a great understanding of realistic expectations for improvements in endurance and pulmonary capacity. Pt would prefer to be seen again by PT when daughter is present. PT will attempt to follow up when daughter is available however this is unlikely over the weekend. This PT attempts to call daughter  however phone number listed in chart was for a Education officer, museum and not the patient's daughter. PT will follow up as able.   Recommendations for follow up therapy are one component of a multi-disciplinary discharge planning process, led by the attending physician.  Recommendations may be updated based on patient status, additional functional criteria and insurance authorization.  Follow Up Recommendations  SNF (pt continues to express desire for SNF placement)     Equipment Recommendations  None recommended by PT    Recommendations for Other Services       Precautions / Restrictions Precautions Precautions: Fall Restrictions Weight Bearing Restrictions: No     Mobility  Bed Mobility Overal bed mobility: Needs Assistance Bed Mobility: Sit to Supine       Sit to supine: Min guard;HOB elevated   General bed mobility comments: pt requires assistance to scoot up to head of bed once in supine    Transfers Overall transfer level: Needs assistance Equipment used: Rolling walker (2 wheeled) Transfers: Sit to/from [facility] Sit to Stand: Min guard Stand pivot transfers: Min guard          Ambulation/Gait Ambulation/Gait assistance:  (pt declines attempts at ambulation due to fatigue)               Stairs             Wheelchair Mobility    Modified Rankin (Stroke Patients Only)       Balance Overall balance assessment: Needs assistance Sitting-balance support: No upper extremity supported;Feet supported Sitting balance-Leahy Scale: Fair     Standing balance support: Single extremity supported;Bilateral upper extremity supported Standing balance-Leahy Scale: Poor Standing balance comment:  reliant on UE support of walker                            Cognition Arousal/Alertness: Awake/alert Behavior During Therapy: WFL for tasks assessed/performed Overall Cognitive Status: Impaired/Different from baseline Area of Impairment:  Memory;Awareness                     Memory: Decreased short-term memory     Awareness: Emergent          Exercises      General Comments General comments (skin integrity, edema, etc.): pt on 3L [place], sats stable in mid 90s however pt tachypneic up to 40 and sustained in 30s for multiple minutes after cessation of mobility. By time of PT departure pt RR in 51s.      Pertinent Vitals/Pain Pain Assessment: No/denies pain    Home Living                      Prior Function            PT Goals (current goals can now be found in the care plan section) Acute Rehab PT Goals Patient Stated Goal: to go to rehab Progress towards PT goals: Not progressing toward goals - comment (limited by fatigue)    Frequency    Min 3X/week      PT Plan Current plan remains appropriate    Co-evaluation              AM-PAC PT "6 Clicks" Mobility   Outcome Measure  Help needed turning from your back to your side while in a flat bed without using bedrails?: A Little Help needed moving from lying on your back to sitting on the side of a flat bed without using bedrails?: A Lot Help needed moving to and from a bed to a chair (including a wheelchair)?: A Little Help needed standing up from a chair using your arms (e.g., wheelchair or bedside chair)?: A Little Help needed to walk in hospital room?: A Lot Help needed climbing 3-5 steps with a railing? : A Lot 6 Click Score: 15    End of Session Equipment Utilized During Treatment: Oxygen Activity Tolerance: Patient limited by fatigue Patient left: with call bell/phone within reach;with bed alarm set;in bed Nurse Communication: Mobility status PT Visit Diagnosis: Other abnormalities of gait and mobility (R26.89)     Time: 9211-9417 PT Time Calculation (min) (ACUTE ONLY): 35 min  Charges:  $Therapeutic Activity: 8-22 mins                    Zenaida Niece, PT, DPT Acute Rehabilitation Pager: [phone] Office  [place] 07/30/2021, 5:00 PM

## 2021-07-30 NOTE — Progress Notes (Signed)
Daily Progress Note   Patient Name: Holly [name]       Date: 07/30/2021 DOB: [birth year]  Age: 75 y.o. MRN#: [id number] Attending Physician: Oswald Hillock, MD Primary Care Physician: Harlan Stains, MD Admit Date: 07/23/2021  Reason for Consultation/Follow-up: Establishing goals of care  Subjective: Medical records reviewed. Patient assessed at the bedside. She reports feeling well today. Patient's daughter [name] and friend [name] are present.  Created space and opportunity for thoughts and feelings on patient's current illness and continued yesterday's goals of care conversation. Patient is feeling disappointed that she does not quality for CIR. She remains committed to trying rehab for improvement and hopes to find out which of her top SNF choices has a bed offer. When asked about her living will and MOST form preferences, she defers to family opinion and becomes somewhat overwhelmed. She would like to revisit this discussion again tomorrow after further deliberation.  I then stepped out with patient's daughter [name] to continue our conversation. We discussed patient's anxiety surrounding advance care planning and her expectations for a marked recovery in functionality. [name] has a good understanding of patient's poor prognosis given her chronic illnesses and ongoing functional decline. She is concerned that patient's anxiety may be further exacerbated by the uncertainties and challenges potentially faced at a SNF. We discussed the impact of chronic home O2, recurrent exacerbations, and need for mechanical ventilation on expected 1 year mortality with a diagnosis of COPD and lung cancer. Counseled on the gradual nature of reaching acceptance and facing one's mortality, the importance of ongoing  conversations, and the unfortunately limited availability of caregiver support even with hospice at home. [name] plans to complete a MOST form this evening with patient and is agreeable to continuing an open, honest discussion tomorrow.       Questions and concerns addressed. PMT will continue to support holistically.   Length of Stay: 7  Current Medications: Scheduled Meds:   ALPRAZolam  0.25 mg Oral QHS   atorvastatin  20 mg Oral Daily   buPROPion  300 mg Oral Daily   chlorhexidine  15 mL Mouth Rinse BID   docusate sodium  100 mg Oral BID   donepezil  10 mg Oral QHS   feeding supplement  1 Container Oral TID BM   furosemide  40 mg Oral Daily  heparin injection (subcutaneous)  5,000 Units Subcutaneous Q8H   levothyroxine  137 mcg Oral Q0600   losartan  50 mg Oral Daily   mouth rinse  15 mL Mouth Rinse q12n4p   metoprolol succinate  50 mg Oral Daily   pantoprazole  40 mg Oral BID   polyethylene glycol  17 g Oral Daily   potassium chloride  40 mEq Oral Q4H   predniSONE  20 mg Oral Q breakfast   sodium chloride flush  10-40 mL Intracatheter Q12H    Continuous Infusions:  sodium chloride Stopped (07/27/21 1814)   ceFEPime (MAXIPIME) IV Stopped (07/30/21 1042)   sodium chloride      PRN Meds: ALPRAZolam, hydrALAZINE, ipratropium-albuterol, naLOXone (NARCAN)  injection, polyethylene glycol, sodium chloride flush  Physical Exam Vitals and nursing note reviewed.  Constitutional:      General: She is not in acute distress.    Appearance: She is ill-appearing.  Cardiovascular:     Rate and Rhythm: Normal rate.  Pulmonary:     Effort: Tachypnea present.  Neurological:     Mental Status: She is alert. Mental status is at baseline.  Psychiatric:        Behavior: Behavior is cooperative.            Vital Signs: BP (!) 179/148 (BP Location: Right Arm)   Pulse 66   Temp 98.3 F (36.8 C) (Oral)   Resp (!) 30   Ht 5\' 4"  (1.626 m)   Wt 67.7 kg   SpO2 97%   BMI 25.62 kg/m   SpO2: SpO2: 97 % O2 Device: O2 Device: Nasal Cannula O2 Flow Rate: O2 Flow Rate (L/min): 3 L/min  Intake/output summary:  Intake/Output Summary (Last 24 hours) at 07/30/2021 1359 Last data filed at 07/30/2021 1232 Gross per 24 hour  Intake 1156 ml  Output 3250 ml  Net -2094 ml   LBM: Last BM Date: 07/28/21 Baseline Weight: Weight: 74.8 kg Most recent weight: Weight: 67.7 kg       Palliative Assessment/Data: 30-40%      Patient Active Problem List   Diagnosis Date Noted   Acute respiratory failure with hypoxia and hypercapnia (HCC)    Septic shock (HCC)    Goals of care, counseling/discussion    Acute on chronic respiratory failure (HCC) 07/23/2021   Hypotension 02/23/2021   Hypokalemia 02/23/2021   Acute renal failure superimposed on stage 3a chronic kidney disease ([place]) 02/22/2021   GI bleed 02/16/2021   ABLA (acute blood loss anemia) 02/16/2021   Diverticulitis 02/16/2021   Current chronic use of systemic steroids 02/16/2021   Pressure injury of skin 02/16/2021   Olecranon bursitis of right elbow 10/02/2020   Ulna, olecranon process fracture, right, closed, with routine healing, subsequent encounter 10/01/2020   Primary malignant neoplasm of left upper lobe of lung ([place]) 07/09/2019   Closed fracture of right olecranon process 12/04/2017   Cough 11/11/2015   Chronic respiratory failure with hypoxia (HCC) 11/11/2014   Cigarette smoker 10/16/2014   Multinodular thyroid 07/30/2014   Neoplasm of uncertain behavior of thyroid gland, 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   COPD exacerbation ([place]) 02/11/2013   Pedal edema 02/11/2013   COPD GOLD III  06/11/2010   CARCINOMA, BASAL CELL, 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   HYPERLIPIDEMIA, MIXED 05/13/2010   HYPERCALCEMIA 05/13/2010   ANXIETY 05/13/2010   Essential hypertension 05/13/2010   CERVICALGIA 05/13/2010   OSTEOPENIA 05/13/2010   URINARY INCONTINENCE 05/13/2010   CARCINOMA, BASAL CELL, 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    Palliative Care Assessment & Plan  Patient Profile: 75 y.o. female  with past medical history of COPD on 3L home Oxygen and chronic steroid therapy, lung cancer diagnosed 5 years ago on intermittent palliative XRT, hypothyroidism admitted on 07/23/2021 with acute on chronic hypoxemic and hypercarbic respiratory failure after being found unresponsive at home.   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 Goals of care conversation  Recommendations/Plan: Continue current care Patient remains interested in SNF for rehab; ongoing ACP discussions Patient does not seem to have a good understanding of her poor long-term prognosis and daughter is reasonably worried that patient will have a difficult time with rehab, ongoing discussions of SNF vs HH  PT eval requested per daughter  PMT will continue to follow and provide support as patient and family are faced with complex decision-making  Goals of Care and Additional Recommendations: Limitations on Scope of Treatment: Full Scope Treatment   Prognosis: Poor prognosis given severe COPD/lung cancer on home O2 with recent ventilator requirement, recurrent hospitalizations, functional decline and multiple comorbidities. Hospice appropriate if aligned with [place]  Discharge Planning: [place] for rehab with Palliative care service follow-up  Care plan was discussed with Patient, patient's daughter and friend  Total time: 25 minutes Greater than 50% of this time was spent in counseling and coordinating care related to the above assessment and plan.  Dorthy Cooler, PA-C Palliative Medicine Team Team phone # [phone]  Thank you for allowing the Palliative Medicine Team to assist in the care of this patient. Please utilize secure chat with additional questions, if there is no response within 30 minutes please call the above phone  number.  Palliative Medicine Team providers are available by phone from 7am to 7pm daily and can be reached through the team cell phone.  Should this patient require assistance outside of these hours, please call the patient's attending physician.

## 2021-07-30 NOTE — Progress Notes (Signed)
[facility]  PROGRESS NOTE  Holly [name] MWN:[id number] DOB: Nov 21, 1945 DOA: 07/23/2021 PCP: Harlan Stains, MD   Brief HPI:   75 year old female with history of COPD on home oxygen, chronic steroid therapy, lung cancer diagnosed 5 years ago on intermittent palliative XRT, hypothyroidism was brought to ED because of unresponsiveness.  Patient lives with her daughter.  Daughter found patient unresponsive at home and EMS was called.  Upon arrival to ED patient had a GCS of 3 so she was intubated.  CT head was negative but venous blood gas found the patient to have PCO2 of 95.6.  Chest x-ray did not show any acute pathology and blood glucose was normal. Patient was extubated on 07/26/2021 She was [facility] to [facility] service on 07/28/2021   Subjective   Patient seen and examined, denies any complaints.   Assessment/Plan:    Acute on chronic hypoxemic and hypercarbic respiratory failure -Likely from underlying COPD, patient is chronically on home O2 3 L/min and chronic prednisone  -She has history of OSA, not on CPAP -Resolved, patient is off mechanical ventilation -Still requiring 5 L of oxygen -Continue to wean off O2 -Continue prednisone 20 mg p.o. daily  Acute on chronic diastolic CHF -Patient is 6 L positive fluid overload; currently requiring 3 L/min of oxygen -She is on oxygen liters per minute at home at baseline -She was started on  home dose Lasix at 40 mg daily -Net - 1.5 L  Septic shock -Resolved, unclear etiology -Patient was started on vancomycin and cefepime -Vancomycin has been discontinued; continue cefepime -Chest x-ray negative   Acute metabolic encephalopathy -Secondary to hypercarbia, hypoxemia -CT head showed 8 mm meningioma -EEG showed no epileptiform discharges -Metabolic encephalopathy has resolved; back to baseline  Acute kidney injury -Creatinine was 1.23 on admission; improved to 0.74 today -Likely in setting of septic shock  Stage Ia  squamous cell carcinoma of left upper lobe -S/p radiation right middle lobe -Right middle lobe mass increased from 10 mm to 2 x 1.7 x 2.4 mm -We will need to follow-up with radiation oncology as outpatient  Peripheral vascular disease/chronic right leg ulcer -Wound care consulted  Hypothyroidism -Continue Synthroid  Hypokalemia -Potassium was 3.1 -We will replace potassium with K-Dur 40 mEq p.o. x2 -Follow BMP in am  Hypertension -Blood pressure is elevated -Continue metoprolol 50 mg daily.  Losartan 50 mg daily -Hydralazine 25 mg p.o. every 6 hours as needed for BP more than 160/100 -Lasix started today as above  Acute on chronic anemia with history of GI bleed -EGD on 02/17/2021 showed nonbleeding ulcer, no evidence of active GI bleed during this admission -Transfused 1 unit PRBC -GI was consulted; she has h/o esophagitis and gastric ulcers -she was started on PPI twice a day,  -Daughter did not want EGD or colonoscopy at this time as per GI note  History of MRSA right elbow -Resulted in removal of hardware -MRSA screen negative  Goals of care -Patient daughter wants to discuss with palliative care -We will consult palliative care for goals of care discussion  Scheduled medications:    ALPRAZolam  0.25 mg Oral QHS   atorvastatin  20 mg Oral Daily   buPROPion  300 mg Oral Daily   chlorhexidine  15 mL Mouth Rinse BID   docusate sodium  100 mg Oral BID   donepezil  10 mg Oral QHS   feeding supplement  1 Container Oral TID BM   furosemide  40 mg Oral Daily   heparin injection (  subcutaneous)  5,000 Units Subcutaneous Q8H   levothyroxine  137 mcg Oral Q0600   losartan  50 mg Oral Daily   mouth rinse  15 mL Mouth Rinse q12n4p   metoprolol succinate  50 mg Oral Daily   pantoprazole  40 mg Oral BID   polyethylene glycol  17 g Oral Daily   predniSONE  20 mg Oral Q breakfast   sodium chloride flush  10-40 mL Intracatheter Q12H     Data Reviewed:   CBG:  Recent Labs   Lab 07/29/21 1210 07/29/21 1604 07/29/21 1921 07/30/21 0737 07/30/21 1214  GLUCAP 112* 192* 254* 163* 209*    SpO2: 97 % O2 Flow Rate (L/min): 3 L/min FiO2 (%): 40 %    Vitals:   07/30/21 0740 07/30/21 0800 07/30/21 1203 07/30/21 1215  BP:  (!) 171/48  (!) 179/148  Pulse:  67  66  Resp:  18  (!) 30  Temp: 98.2 F (36.8 C)   98.3 F (36.8 C)  TempSrc: Axillary   Oral  SpO2:  96% 94% 97%  Weight:      Height:         Intake/Output Summary (Last 24 hours) at 07/30/2021 1628 Last data filed at 07/30/2021 1232 Gross per 24 hour  Intake 920 ml  Output 2850 ml  Net -1930 ml    10/12 1901 - 10/14 0700 In: 1679 [P.O.:1376; I.V.:3] Out: 3350 [Urine:3350]  Filed Weights   07/27/21 2142 07/29/21 0400 07/30/21 0344  Weight: 68.9 kg 68.4 kg 67.7 kg    Data Reviewed: Basic Metabolic Panel: Recent Labs  Lab 07/24/21 0641 07/24/21 2036 07/25/21 0429 07/25/21 1700 07/26/21 0355 07/28/21 0221 07/29/21 1207 07/30/21 0340  NA 142  --  141  --  139 140 141 141  K 3.8  --  2.9*  --  3.5 3.9 3.7 3.1*  CL 95*  --  98  --  101 96* 97* 94*  CO2 35*  --  34*  --  32 38* 37* 40*  GLUCOSE 109*  --  189*  --  204* 88 89 140*  BUN 20  --  24*  --  27* 16 14 15   CREATININE 1.23*  --  1.02*  --  1.09* 0.74 0.78 0.74  CALCIUM 9.5  --  9.1  --  8.9 9.7 9.5 9.3  MG 1.8 1.7 2.5* 2.4 2.3  --   --   --   PHOS 1.2* 2.0* 4.4 3.3 3.3  --   --   --    Liver Function Tests: Recent Labs  Lab 07/23/21 2027 07/25/21 0429  AST 12* 20  ALT 9 12  ALKPHOS 92 59  BILITOT 0.6 0.7  PROT 6.7 5.4*  ALBUMIN 2.7* 2.1*   No results for input(s): LIPASE, AMYLASE in the last 168 hours. Recent Labs  Lab 07/23/21 2330  AMMONIA 82*   CBC: Recent Labs  Lab 07/23/21 2027 07/23/21 2113 07/27/21 1125 07/27/21 1819 07/28/21 0221 07/28/21 0959 07/29/21 0256  WBC 15.2*   < > 7.2 7.8 8.0 8.0 6.9  NEUTROABS 9.8*  --   --   --   --   --   --   HGB 9.4*   < > 9.6* 9.7* 10.3* 9.9* 9.5*  HCT  35.2*   < > 32.8* 34.3* 35.4* 33.9* 32.4*  MCV 104.8*   < > 97.9 98.6 96.7 95.2 95.9  PLT 209   < > 123* 137* 139* 132* 135*   < > =  values in this interval not displayed.   Cardiac Enzymes: No results for input(s): CKTOTAL, CKMB, CKMBINDEX, TROPONINI in the last 168 hours. BNP (last 3 results) Recent Labs    02/23/21 0502 03/22/21 2325 07/23/21 2202  BNP 53.4 48.0 147.2*    ProBNP (last 3 results) No results for input(s): PROBNP in the last 8760 hours.  CBG: Recent Labs  Lab 07/29/21 1210 07/29/21 1604 07/29/21 1921 07/30/21 0737 07/30/21 1214  GLUCAP 112* 192* 254* 163* 209*       Radiology Reports  No results found.     Antibiotics: Anti-infectives (From admission, onward)    Start     Dose/Rate Route Frequency Ordered Stop   07/25/21 1300  vancomycin (VANCOREADY) IVPB 750 mg/150 mL  Status:  Discontinued        750 mg 150 mL/hr over 60 Minutes Intravenous Every 24 hours 07/24/21 1058 07/27/21 1046   07/24/21 1145  ceFEPIme (MAXIPIME) 2 g in sodium chloride 0.9 % 100 mL IVPB        2 g 200 mL/hr over 30 Minutes Intravenous Every 12 hours 07/24/21 1048 07/30/21 2359   07/24/21 1145  vancomycin (VANCOREADY) IVPB 1500 mg/300 mL        1,500 mg 150 mL/hr over 120 Minutes Intravenous  Once 07/24/21 1048 07/25/21 0028   07/24/21 1000  rifaximin (XIFAXAN) tablet 550 mg  Status:  Discontinued        550 mg Per Tube 2 times daily 07/24/21 0543 07/24/21 1048         DVT prophylaxis: Heparin  Code Status: DNR  Family Communication: Discussed with patient's daughter at bedside   Consultants:   Procedures:     Objective    Physical Examination:   General-appears in no acute distress Heart-S1-S2, regular, no murmur auscultated Lungs-decreased breath sounds bilaterally Abdomen-soft, nontender, no organomegaly Extremities-no edema in the lower extremities Neuro-alert, oriented x3, no focal deficit noted  Status is: Inpatient  Dispo: The  patient is from: Home              Anticipated d/c is to: To be decided              Anticipated d/c date is: 07/29/2021              Patient currently not stable for discharge  Barrier to discharge-awaiting palliative care discussion with family  COVID-19 Labs  No results for input(s): DDIMER, FERRITIN, LDH, CRP in the last 72 hours.  Lab Results  Component Value Date   SARSCOV2NAA NEGATIVE 07/23/2021   [place] NEGATIVE 03/22/2021   [place] NEGATIVE 02/22/2021   [place] NEGATIVE 01/12/2021     Pressure Injury 02/16/21 Buttocks Right Stage 2 -  Partial thickness loss of dermis presenting as a shallow open injury with a red, pink wound bed without slough. (Active)  02/16/21 0230  Location: Buttocks  Location Orientation: Right  Staging: Stage 2 -  Partial thickness loss of dermis presenting as a shallow open injury with a red, pink wound bed without slough.  Wound Description (Comments):   Present on Admission: Yes        Recent Results (from the past 240 hour(s))  Culture, blood (Routine x 2)     Status: None   Collection Time: 07/23/21  8:27 PM   Specimen: BLOOD RIGHT ARM  Result Value Ref Range Status   Specimen Description BLOOD RIGHT ARM  Final   Special Requests   Final    BOTTLES DRAWN AEROBIC AND ANAEROBIC  Blood Culture adequate volume   Culture   Final    NO GROWTH 5 DAYS Performed at [facility], [place] [address], [place], [place] [address]    Report Status 07/28/2021 FINAL  Final  Resp Panel by RT-PCR (Flu A&B, Covid) Nasopharyngeal Swab     Status: None   Collection Time: 07/23/21  8:33 PM   Specimen: Nasopharyngeal Swab; Nasopharyngeal(NP) swabs in vial transport medium  Result Value Ref Range Status   SARS Coronavirus 2 by RT PCR NEGATIVE NEGATIVE Final    Comment: (NOTE) SARS-CoV-2 target nucleic acids are NOT DETECTED.  The SARS-CoV-2 RNA is generally detectable in upper respiratory specimens during the acute phase of infection.  The lowest concentration of SARS-CoV-2 viral copies this assay can detect is 138 copies/mL. A negative result does not preclude SARS-Cov-2 infection and should not be used as the sole basis for treatment or other patient management decisions. A negative result may occur with  improper specimen collection/handling, submission of specimen other than nasopharyngeal swab, presence of viral mutation(s) within the areas targeted by this assay, and inadequate number of viral copies(<138 copies/mL). A negative result must be combined with clinical observations, patient history, and epidemiological information. The expected result is Negative.  Fact Sheet for Patients:  EntrepreneurPulse.com.au  Fact Sheet for Healthcare Providers:  IncredibleEmployment.be  This test is no t yet approved or cleared by the [place] FDA and  has been authorized for detection and/or diagnosis of SARS-CoV-2 by FDA under an Emergency Use Authorization (EUA). This EUA will remain  in effect (meaning this test can be used) for the duration of the COVID-19 declaration under Section 564(b)(1) of the Act, 21 U.S.C.section 360bbb-3(b)(1), unless the authorization is terminated  or revoked sooner.       Influenza A by PCR NEGATIVE NEGATIVE Final   Influenza B by PCR NEGATIVE NEGATIVE Final    Comment: (NOTE) The Xpert Xpress SARS-CoV-2/FLU/RSV plus assay is intended as an aid in the diagnosis of influenza from Nasopharyngeal swab specimens and should not be used as a sole basis for treatment. Nasal washings and aspirates are unacceptable for Xpert Xpress SARS-CoV-2/FLU/RSV testing.  Fact Sheet for Patients: EntrepreneurPulse.com.au  Fact Sheet for Healthcare Providers: IncredibleEmployment.be  This test is not yet approved or cleared by the [place] FDA and has been authorized for detection and/or diagnosis of SARS-CoV-2 by FDA  under an Emergency Use Authorization (EUA). This EUA will remain in effect (meaning this test can be used) for the duration of the COVID-19 declaration under Section 564(b)(1) of the Act, 21 U.S.C. section 360bbb-3(b)(1), unless the authorization is terminated or revoked.  Performed at [facility], [place] [address], [place], [place] [address]   MRSA Next Gen by PCR, Nasal     Status: None   Collection Time: 07/24/21  3:08 AM   Specimen: Nasal Mucosa; Nasal Swab  Result Value Ref Range Status   MRSA by PCR Next Gen NOT DETECTED NOT DETECTED Final    Comment: (NOTE) The GeneXpert MRSA Assay (FDA approved for NASAL specimens only), is one component of a comprehensive MRSA colonization surveillance program. It is not intended to diagnose MRSA infection nor to guide or monitor treatment for MRSA infections. Test performance is not FDA approved in patients less than 69 years old. Performed at [facility], [place] [address] [place], [place] [address]     [facility] If 7PM-7AM, please contact night-coverage at [email or web address], Office  [phone]   07/30/2021, 4:28 PM  LOS: 7 days

## 2021-07-31 DIAGNOSIS — J9601 Acute respiratory failure with hypoxia: Secondary | ICD-10-CM | POA: Diagnosis not present

## 2021-07-31 DIAGNOSIS — J9621 Acute and chronic respiratory failure with hypoxia: Secondary | ICD-10-CM | POA: Diagnosis not present

## 2021-07-31 DIAGNOSIS — Z7189 Other specified counseling: Secondary | ICD-10-CM | POA: Diagnosis not present

## 2021-07-31 DIAGNOSIS — J9602 Acute respiratory failure with hypercapnia: Secondary | ICD-10-CM | POA: Diagnosis not present

## 2021-07-31 LAB — GLUCOSE, CAPILLARY
Glucose-Capillary: 97 mg/dL (ref 70–99)
Glucose-Capillary: 153 mg/dL — ABNORMAL HIGH (ref 70–99)
Glucose-Capillary: 239 mg/dL — ABNORMAL HIGH (ref 70–99)
Glucose-Capillary: 238 mg/dL — ABNORMAL HIGH (ref 70–99)

## 2021-07-31 LAB — BASIC METABOLIC PANEL
Anion gap: 7 (ref 5–15)
BUN: 10 mg/dL (ref 8–23)
CO2: 38 mmol/L — ABNORMAL HIGH (ref 22–32)
Calcium: 9.6 mg/dL (ref 8.9–10.3)
Chloride: 97 mmol/L — ABNORMAL LOW (ref 98–111)
Creatinine, Ser: 0.6 mg/dL (ref 0.44–1.00)
GFR, Estimated: >60 mL/min (ref >60)
Glucose, Bld: 96 mg/dL (ref 70–99)
Potassium: 3.4 mmol/L — ABNORMAL LOW (ref 3.5–5.1)
Sodium: 142 mmol/L (ref 135–145)

## 2021-07-31 MED ORDER — POTASSIUM CHLORIDE CRYS ER 20 MEQ PO TBCR
40.0000 meq | EXTENDED_RELEASE_TABLET | Freq: Once | ORAL | Status: AC
  Administered 2021-07-31: 40 meq via ORAL
  Filled 2021-07-31: qty 2

## 2021-07-31 NOTE — Progress Notes (Signed)
[facility]  PROGRESS NOTE  Holly [name] JQB:[id number] DOB: Dec 09, 1945 DOA: 07/23/2021 PCP: Harlan Stains, MD   Brief HPI:   75 year old female with history of COPD on home oxygen, chronic steroid therapy, lung cancer diagnosed 5 years ago on intermittent palliative XRT, hypothyroidism was brought to ED because of unresponsiveness.  Patient lives with her daughter.  Daughter found patient unresponsive at home and EMS was called.  Upon arrival to ED patient had a GCS of 3 so she was intubated.  CT head was negative but venous blood gas found the patient to have PCO2 of 95.6.  Chest x-ray did not show any acute pathology and blood glucose was normal. Patient was extubated on 07/26/2021 She was transferred to [facility] service on 07/28/2021   Subjective   Patient seen and examined, breathing is slowly improving.  Diuresing well with Lasix.  Still requiring 4 to 5 L of oxygen by nasal cannula.   Assessment/Plan:    Acute on chronic hypoxemic and hypercarbic respiratory failure -Likely from underlying COPD, patient is chronically on home O2 3 L/min and chronic prednisone  -She has history of OSA, not on CPAP -Resolved, patient is off mechanical ventilation -Still requiring 5 L of oxygen -Continue to wean off O2 -Continue prednisone 20 mg p.o. daily  Acute on chronic diastolic CHF -Patient was 6 L positive fluid overload; currently requiring 5 L/min of oxygen -She is on oxygen liters per minute at home at baseline -She was started on  home dose Lasix at 40 mg daily -Net still +3.9 L  Septic shock -Resolved, unclear etiology -Patient was started on vancomycin and cefepime -Vancomycin has been discontinued; continue cefepime -Chest x-ray negative   Acute metabolic encephalopathy -Secondary to hypercarbia, hypoxemia -CT head showed 8 mm meningioma -EEG showed no epileptiform discharges -Metabolic encephalopathy has resolved; back to baseline  Acute kidney injury -Creatinine was  1.23 on admission; improved to 0.74 today -Likely in setting of septic shock  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  Losartan 50 mg daily -Hydralazine 25 mg p.o. every 6 hours as needed for BP more than 160/100 -Lasix started today as above  Acute on chronic anemia with history of GI bleed -EGD on 02/17/2021 showed nonbleeding ulcer, no evidence of active GI bleed during this admission -Transfused 1 unit PRBC -GI was consulted; she has h/o esophagitis and gastric ulcers -she was started on PPI twice a day,  -Daughter did not want EGD or colonoscopy at this time as per GI note  History of MRSA right elbow -Resulted in removal of hardware -MRSA screen negative  Goals of care -Patient daughter wants to discuss with palliative care -We will consult palliative care for goals of care discussion  Scheduled medications:    ALPRAZolam  0.25 mg Oral QHS   atorvastatin  20 mg Oral Daily   buPROPion  300 mg Oral Daily   chlorhexidine  15 mL Mouth Rinse BID   docusate sodium  100 mg Oral BID   donepezil  10 mg Oral QHS   feeding supplement  1 Container Oral  TID BM   furosemide  40 mg Oral Daily   heparin injection (subcutaneous)  5,000 Units Subcutaneous Q8H   levothyroxine  137 mcg Oral Q0600   losartan  50 mg Oral Daily   mouth rinse  15 mL Mouth Rinse q12n4p   metoprolol succinate  50 mg Oral Daily   pantoprazole  40 mg Oral BID   polyethylene glycol  17 g Oral Daily   predniSONE  20 mg Oral Q breakfast   sodium chloride flush   10-40 mL Intracatheter Q12H     Data Reviewed:   CBG:  Recent Labs  Lab 07/30/21 1703 07/30/21 2059 07/31/21 0756 07/31/21 1204 07/31/21 1550  GLUCAP 222* 128* 97 153* 239*    SpO2: 99 % O2 Flow Rate (L/min): 5 L/min FiO2 (%): 40 %    Vitals:   07/31/21 0411 07/31/21 0754 07/31/21 1202 07/31/21 1550  BP: (!) 161/69 (!) 169/65 (!) 168/65 (!) 137/47  Pulse:  60 65 62  Resp:  20 20 19   Temp: 98 F (36.7 C) 98.2 F (36.8 C) 98.3 F (36.8 C) 98.1 F (36.7 C)  TempSrc: Oral Oral Oral Oral  SpO2:  100% 100% 99%  Weight:      Height:         Intake/Output Summary (Last 24 hours) at 07/31/2021 1650 Last data filed at 07/31/2021 0700 Gross per 24 hour  Intake 240 ml  Output 1001 ml  Net -761 ml    10/13 1901 - 10/15 0700 In: 1400 [P.O.:1200] Out: 3851 [Urine:3850]  Filed Weights   07/29/21 0400 07/30/21 0344 07/31/21 0338  Weight: 68.4 kg 67.7 kg 67.7 kg    Data Reviewed: Basic Metabolic Panel: Recent Labs  Lab 07/24/21 2036 07/25/21 0429 07/25/21 0429 07/25/21 1700 07/26/21 0355 07/28/21 0221 07/29/21 1207 07/30/21 0340 07/31/21 0335  NA  --  141   < >  --  139 140 141 141 142  K  --  2.9*   < >  --  3.5 3.9 3.7 3.1* 3.4*  CL  --  98   < >  --  101 96* 97* 94* 97*  CO2  --  34*   < >  --  32 38* 37* 40* 38*  GLUCOSE  --  189*   < >  --  204* 88 89 140* 96  BUN  --  24*   < >  --  27* 16 14 15 10   CREATININE  --  1.02*   < >  --  1.09* 0.74 0.78 0.74 0.60  CALCIUM  --  9.1   < >  --  8.9 9.7 9.5 9.3 9.6  MG 1.7 2.5*  --  2.4 2.3  --   --   --   --   PHOS 2.0* 4.4  --  3.3 3.3  --   --   --   --    < > = values in this interval not displayed.   Liver Function Tests: Recent Labs  Lab 07/25/21 0429  AST 20  ALT 12  ALKPHOS 59  BILITOT 0.7  PROT 5.4*  ALBUMIN 2.1*   No results for input(s): LIPASE, AMYLASE in the last 168 hours. No results for input(s): AMMONIA in the last 168 hours.  CBC: Recent Labs  Lab 07/27/21 1125  07/27/21 1819 07/28/21 0221 07/28/21 0959 07/29/21 0256  WBC 7.2 7.8 8.0 8.0 6.9  HGB 9.6* 9.7* 10.3* 9.9* 9.5*  HCT 32.8* 34.3* 35.4* 33.9*  32.4*  MCV 97.9 98.6 96.7 95.2 95.9  PLT 123* 137* 139* 132* 135*   Cardiac Enzymes: No results for input(s): CKTOTAL, CKMB, CKMBINDEX, TROPONINI in the last 168 hours. BNP (last 3 results) Recent Labs    02/23/21 0502 03/22/21 2325 07/23/21 2202  BNP 53.4 48.0 147.2*    ProBNP (last 3 results) No results for input(s): PROBNP in the last 8760 hours.  CBG: Recent Labs  Lab 07/30/21 1703 07/30/21 2059 07/31/21 0756 07/31/21 1204 07/31/21 1550  GLUCAP 222* 128* 97 153* 239*       Radiology Reports  No results found.     Antibiotics: Anti-infectives (From admission, onward)    Start     Dose/Rate Route Frequency Ordered Stop   07/25/21 1300  vancomycin (VANCOREADY) IVPB 750 mg/150 mL  Status:  Discontinued        750 mg 150 mL/hr over 60 Minutes Intravenous Every 24 hours 07/24/21 1058 07/27/21 1046   07/24/21 1145  ceFEPIme (MAXIPIME) 2 g in sodium chloride 0.9 % 100 mL IVPB        2 g 200 mL/hr over 30 Minutes Intravenous Every 12 hours 07/24/21 1048 07/30/21 2135   07/24/21 1145  vancomycin (VANCOREADY) IVPB 1500 mg/300 mL        1,500 mg 150 mL/hr over 120 Minutes Intravenous  Once 07/24/21 1048 07/25/21 0028   07/24/21 1000  rifaximin (XIFAXAN) tablet 550 mg  Status:  Discontinued        550 mg Per Tube 2 times daily 07/24/21 0543 07/24/21 1048         DVT prophylaxis: Heparin  Code Status: DNR  Family Communication: Discussed with patient's daughter at bedside   Consultants:   Procedures:     Objective    Physical Examination:  General-appears in no acute distress Heart-S1-S2, regular, no murmur auscultated Lungs-decreased breath sounds bilaterally Abdomen-soft, nontender, no organomegaly Extremities-no edema in the lower extremities Neuro-alert, oriented x3, no focal deficit  noted  Status is: Inpatient  Dispo: The patient is from: Home              Anticipated d/c is to: To be decided              Anticipated d/c date is: 08/02/2021              Patient currently not stable for discharge  Barrier to discharge-awaiting palliative care discussion with family  COVID-19 Labs  No results for input(s): DDIMER, FERRITIN, LDH, CRP in the last 72 hours.  Lab Results  Component Value Date   SARSCOV2NAA NEGATIVE 07/23/2021   [place] NEGATIVE 03/22/2021   [place] NEGATIVE 02/22/2021   [place] NEGATIVE 01/12/2021     Pressure Injury 02/16/21 Buttocks Right Stage 2 -  Partial thickness loss of dermis presenting as a shallow open injury with a red, pink wound bed without slough. (Active)  02/16/21 0230  Location: Buttocks  Location Orientation: Right  Staging: Stage 2 -  Partial thickness loss of dermis presenting as a shallow open injury with a red, pink wound bed without slough.  Wound Description (Comments):   Present on Admission: Yes        Recent Results (from the past 240 hour(s))  Culture, blood (Routine x 2)     Status: None   Collection Time: 07/23/21  8:27 PM   Specimen: BLOOD RIGHT ARM  Result Value Ref Range Status   Specimen Description BLOOD RIGHT ARM  Final   Special Requests   Final  BOTTLES DRAWN AEROBIC AND ANAEROBIC Blood Culture adequate volume   Culture   Final    NO GROWTH 5 DAYS Performed at [facility], [place] [address] [place], [place] [address]    Report Status 07/28/2021 FINAL  Final  Resp Panel by RT-PCR (Flu A&B, Covid) Nasopharyngeal Swab     Status: None   Collection Time: 07/23/21  8:33 PM   Specimen: Nasopharyngeal Swab; Nasopharyngeal(NP) swabs in vial transport medium  Result Value Ref Range Status   SARS Coronavirus 2 by RT PCR NEGATIVE NEGATIVE Final    Comment: (NOTE) SARS-CoV-2 target nucleic acids are NOT DETECTED.  The SARS-CoV-2 RNA is generally detectable in upper  respiratory specimens during the acute phase of infection. The lowest concentration of SARS-CoV-2 viral copies this assay can detect is 138 copies/mL. A negative result does not preclude SARS-Cov-2 infection and should not be used as the sole basis for treatment or other patient management decisions. A negative result may occur with  improper specimen collection/handling, submission of specimen other than nasopharyngeal swab, presence of viral mutation(s) within the areas targeted by this assay, and inadequate number of viral copies(<138 copies/mL). A negative result must be combined with clinical observations, patient history, and epidemiological information. The expected result is Negative.  Fact Sheet for Patients:  EntrepreneurPulse.com.au  Fact Sheet for Healthcare Providers:  IncredibleEmployment.be  This test is no t yet approved or cleared by the [place] FDA and  has been authorized for detection and/or diagnosis of SARS-CoV-2 by FDA under an Emergency Use Authorization (EUA). This EUA will remain  in effect (meaning this test can be used) for the duration of the COVID-19 declaration under Section 564(b)(1) of the Act, 21 U.S.C.section 360bbb-3(b)(1), unless the authorization is terminated  or revoked sooner.       Influenza A by PCR NEGATIVE NEGATIVE Final   Influenza B by PCR NEGATIVE NEGATIVE Final    Comment: (NOTE) The Xpert Xpress SARS-CoV-2/FLU/RSV plus assay is intended as an aid in the diagnosis of influenza from Nasopharyngeal swab specimens and should not be used as a sole basis for treatment. Nasal washings and aspirates are unacceptable for Xpert Xpress SARS-CoV-2/FLU/RSV testing.  Fact Sheet for Patients: EntrepreneurPulse.com.au  Fact Sheet for Healthcare Providers: IncredibleEmployment.be  This test is not yet approved or cleared by the [place] FDA and has been  authorized for detection and/or diagnosis of SARS-CoV-2 by FDA under an Emergency Use Authorization (EUA). This EUA will remain in effect (meaning this test can be used) for the duration of the COVID-19 declaration under Section 564(b)(1) of the Act, 21 U.S.C. section 360bbb-3(b)(1), unless the authorization is terminated or revoked.  Performed at [facility], [place] [address] [place], [place] [address]   MRSA Next Gen by PCR, Nasal     Status: None   Collection Time: 07/24/21  3:08 AM   Specimen: Nasal Mucosa; Nasal Swab  Result Value Ref Range Status   MRSA by PCR Next Gen NOT DETECTED NOT DETECTED Final    Comment: (NOTE) The GeneXpert MRSA Assay (FDA approved for NASAL specimens only), is one component of a comprehensive MRSA colonization surveillance program. It is not intended to diagnose MRSA infection nor to guide or monitor treatment for MRSA infections. Test performance is not FDA approved in patients less than 81 years old. Performed at [facility], [place] [address] [place], [place] [address]     Oswald Hillock   [facility] If 7PM-7AM, please contact  night-coverage at [email or web address], Office  [phone]   07/31/2021, 4:50 PM  LOS: 8 days

## 2021-07-31 NOTE — Progress Notes (Signed)
Daily Progress Note   Patient Name: Holly [name]       Date: 07/31/2021 DOB: 27-May-1946  Age: 75 y.o. MRN#: [id number] Attending Physician: Oswald Hillock, MD Primary Care Physician: Harlan Stains, MD Admit Date: 07/23/2021  Reason for Consultation/Follow-up: Establishing goals of care  Subjective: Medical records reviewed. Patient assessed at the bedside. She reports feeling well today. Patient's daughter Holly [name] is present.  With patient's permission, I shared my concern for her long-term prognosis and increased needs for support. Educated on the progressive nature of COPD and high risk for readmission given her recent need for mechanical ventilation and now increased O2 needs. Discussed appropriateness of hospice, considering that she was GOLD 3 prior to admission and currently has severe difficulty with ambulating even short distances. Hospice services were then explained at length.   We also discussed patient's lung cancer and increased size of the right middle lobe mass, further complicating her illness. They wish to discuss this with their oncologist Dr. Lisbeth Renshaw once more before before transitioning to hospice.  Reviewed patient's worsening symptom burden, including dyspnea, anxiety, weakness and fatigue. Patient now understands that she will likely not return to her previous baseline. She does wish to make some improvements so that she can remain at home without being a burden to her daughter. Patient does not want to "give up hope" and is willing to eat healthier and increase her activity level, which she has struggled with in the past. Provided reassurance that accepting hospice services does not mean that death is imminent, rather it would allow her to focus on her peace, dignity, and relief  from her symptoms while continuing to make the behavioral changes that she would find beneficial to her quality of life. She agrees with this philosophy and looks forward to using medications for comfort after completing her therapy as tolerated. She would prefer not to return to the hospital if her symptoms can be addressed at home. Counseled on option of HH vs SNF for therapy. Family agrees that patient's needs would be better addressed in her home environment. They agree with an outpatient palliative referral, planning to transition to hospice after an oncology visit and trial of PT for strengthening.         Questions and concerns addressed. PMT will continue to support holistically.   Length  of Stay: 8  Current Medications: Scheduled Meds:   ALPRAZolam  0.25 mg Oral QHS   atorvastatin  20 mg Oral Daily   buPROPion  300 mg Oral Daily   chlorhexidine  15 mL Mouth Rinse BID   docusate sodium  100 mg Oral BID   donepezil  10 mg Oral QHS   feeding supplement  1 Container Oral TID BM   furosemide  40 mg Oral Daily   heparin injection (subcutaneous)  5,000 Units Subcutaneous Q8H   levothyroxine  137 mcg Oral Q0600   losartan  50 mg Oral Daily   mouth rinse  15 mL Mouth Rinse q12n4p   metoprolol succinate  50 mg Oral Daily   pantoprazole  40 mg Oral BID   polyethylene glycol  17 g Oral Daily   predniSONE  20 mg Oral Q breakfast   sodium chloride flush  10-40 mL Intracatheter Q12H    Continuous Infusions:  sodium chloride Stopped (07/27/21 1814)   sodium chloride      PRN Meds: ALPRAZolam, hydrALAZINE, ipratropium-albuterol, naLOXone (NARCAN)  injection, polyethylene glycol, sodium chloride flush  Physical Exam Vitals and nursing note reviewed.  Constitutional:      General: She is not in acute distress.    Appearance: She is ill-appearing.  Cardiovascular:     Rate and Rhythm: Normal rate.  Pulmonary:     Effort: Tachypnea present.  Neurological:     Mental Status: She is  alert. Mental status is at baseline.  Psychiatric:        Behavior: Behavior is cooperative.            Vital Signs: BP (!) 169/65 (BP Location: Right Wrist)   Pulse 60   Temp 98.2 F (36.8 C) (Oral)   Resp 20   Ht 5\' 4"  (1.626 m)   Wt 67.7 kg   SpO2 100%   BMI 25.62 kg/m  SpO2: SpO2: 100 % O2 Device: O2 Device: Nasal Cannula O2 Flow Rate: O2 Flow Rate (L/min): 5 L/min  Intake/output summary:  Intake/Output Summary (Last 24 hours) at 07/31/2021 1130 Last data filed at 07/31/2021 0700 Gross per 24 hour  Intake 480 ml  Output 1701 ml  Net -1221 ml    LBM: Last BM Date: 07/28/21 Baseline Weight: Weight: 74.8 kg Most recent weight: Weight: 67.7 kg       Palliative Assessment/Data: 30-40%      Patient Active Problem List   Diagnosis Date Noted   Acute respiratory failure with hypoxia and hypercapnia (HCC)    Septic shock (HCC)    Goals of care, counseling/discussion    Acute on chronic respiratory failure ([place]) 07/23/2021   Hypotension 02/23/2021   Hypokalemia 02/23/2021   Acute renal failure superimposed on stage 3a chronic kidney disease ([place]) 02/22/2021   GI bleed 02/16/2021   ABLA (acute blood loss anemia) 02/16/2021   Diverticulitis 02/16/2021   Current chronic use of systemic steroids 02/16/2021   Pressure injury of skin 02/16/2021   Olecranon bursitis of right elbow 10/02/2020   Ulna, olecranon process fracture, right, closed, with routine healing, subsequent encounter 10/01/2020   Primary malignant neoplasm of left upper lobe of lung ([place]) 07/09/2019   Closed fracture of right olecranon process 12/04/2017   Cough 11/11/2015   Chronic respiratory failure with hypoxia ([place]) 11/11/2014   Cigarette smoker 10/16/2014   Multinodular thyroid 07/30/2014   Neoplasm of uncertain behavior of thyroid gland, 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 female  with past medical history of COPD on 3L home Oxygen and chronic steroid therapy, lung cancer diagnosed 5 years ago on intermittent palliative XRT, hypothyroidism admitted on 07/23/2021 with acute on chronic hypoxemic and hypercarbic respiratory failure after being found unresponsive at home.   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 Will scan into EMR Patient is requesting input from oncology during admission if possible; discussed with Dr. Darrick Meigs. Patient will set up an outpatient follow-up otherwise (Dr. Lisbeth Renshaw) Psychosocial and emotional support provided PMT remains available for acute needs  Goals of Care and Additional Recommendations: Limitations on Scope of Treatment: Avoid Hospitalization and No Artificial Feeding   Prognosis: Poor prognosis given severe COPD/lung cancer on home O2 with recent ventilator requirement, recurrent hospitalizations, functional decline and multiple comorbidities  Discharge Planning: Home with Home Health and outpatient palliative care, then transition to hospice at home  Care plan was discussed with Patient, patient's daughter, Dr.  Darrick Meigs, Coralee Pesa LCSW, Marthenia Rolling NCM  Total time: 50 minutes Greater than 50% of this time was spent in counseling and coordinating care related to the above assessment and plan.  Dorthy Cooler, PA-C Palliative Medicine Team Team phone # [phone]  Thank you for allowing the Palliative Medicine Team to assist in the care of this patient. Please utilize secure chat with additional questions, if there is no response within 30 minutes please call the above phone number.  Palliative Medicine Team providers are available by phone from 7am to 7pm daily and can be reached through the team cell phone.  Should this patient require assistance outside of these hours, please call the patient's attending physician.

## 2021-07-31 NOTE — TOC Progression Note (Signed)
Transition of Care (TOC) - Progression Note    Patient Details  Name: Holly [name] MRN: [id number] Date of Birth: 09/10/1946  Transition of Care (TOC) CM/SW Contact    , LCSWA Phone Number: 07/31/2021, 3:06 PM  Clinical Narrative:    CSW met with pt and daughter at bedside to provide SNF bed offers vs HH. Pt and daughter agree they would like to discharge home with HH. They have previously had [facility], but would like to try another company if possible. Pt's house is fully accessible and they have all necessary DME and oxygen. Pt [facility] also [facility] at [facility]. Pt is currently not ready to DC, TOC will have pt set up when closer to DC.   Expected Discharge Plan: Home/Self Care Barriers to Discharge: Continued Medical Work up  Expected Discharge Plan and Services Expected Discharge Plan: Home/Self Care In-house Referral: NA Discharge Planning Services: CM Consult Post Acute Care Choice: NA Living arrangements for the past 2 months: Single Family Home                 DME Arranged: N/A DME Agency: NA       HH Arranged: NA HH Agency: NA         Social Determinants of Health (SDOH) Interventions    Readmission Risk Interventions Readmission Risk Prevention Plan 07/28/2021 02/18/2021  Transportation Screening Complete Complete  PCP or Specialist Appt within 5-7 Days - Complete  Home Care Screening - Complete  Medication Review (RN CM) - Complete  Medication Review (RN Care Manager) Referral to Pharmacy -  PCP or Specialist appointment within 3-5 days of discharge Complete -  HRI or Home Care Consult Patient refused -  SW Recovery Care/Counseling Consult Complete -  Palliative Care Screening Not Applicable -  Skilled Nursing Facility Not Applicable -  Some recent data might be hidden    

## 2021-08-01 DIAGNOSIS — J9621 Acute and chronic respiratory failure with hypoxia: Secondary | ICD-10-CM | POA: Diagnosis not present

## 2021-08-01 DIAGNOSIS — J9601 Acute respiratory failure with hypoxia: Secondary | ICD-10-CM | POA: Diagnosis not present

## 2021-08-01 DIAGNOSIS — Z7189 Other specified counseling: Secondary | ICD-10-CM | POA: Diagnosis not present

## 2021-08-01 DIAGNOSIS — J9602 Acute respiratory failure with hypercapnia: Secondary | ICD-10-CM | POA: Diagnosis not present

## 2021-08-01 LAB — BASIC METABOLIC PANEL
Anion gap: 6 (ref 5–15)
BUN: 10 mg/dL (ref 8–23)
CO2: 42 mmol/L — ABNORMAL HIGH (ref 22–32)
Calcium: 10.1 mg/dL (ref 8.9–10.3)
Chloride: 95 mmol/L — ABNORMAL LOW (ref 98–111)
Creatinine, Ser: 0.79 mg/dL (ref 0.44–1.00)
GFR, Estimated: >60 mL/min (ref >60)
Glucose, Bld: 92 mg/dL (ref 70–99)
Potassium: 4 mmol/L (ref 3.5–5.1)
Sodium: 143 mmol/L (ref 135–145)

## 2021-08-01 LAB — GLUCOSE, CAPILLARY
Glucose-Capillary: 163 mg/dL — ABNORMAL HIGH (ref 70–99)
Glucose-Capillary: 121 mg/dL — ABNORMAL HIGH (ref 70–99)

## 2021-08-01 MED ORDER — INSULIN ASPART 100 UNIT/ML IJ SOLN
0.0000 [IU] | Freq: Three times a day (TID) | INTRAMUSCULAR | Status: DC
  Administered 2021-08-01: 1 [IU] via SUBCUTANEOUS

## 2021-08-01 NOTE — Plan of Care (Signed)
  Problem: Safety: Goal: Non-violent Restraint(s) Outcome: Progressing   Problem: Education: Goal: Knowledge of disease or condition will improve Outcome: Progressing Goal: Knowledge of the prescribed therapeutic regimen will improve Outcome: Progressing Goal: Individualized Educational Video(s) Outcome: Progressing   Problem: Activity: Goal: Ability to tolerate increased activity will improve Outcome: Progressing Goal: Will verbalize the importance of balancing activity with adequate rest periods Outcome: Progressing   Problem: Respiratory: Goal: Ability to maintain a clear airway will improve Outcome: Progressing Goal: Levels of oxygenation will improve Outcome: Progressing Goal: Ability to maintain adequate ventilation will improve Outcome: Progressing   Problem: Education: Goal: Knowledge of General Education information will improve Description: Including pain rating scale, medication(s)/side effects and non-pharmacologic comfort measures Outcome: Progressing   Problem: Health Behavior/Discharge Planning: Goal: Ability to manage health-related needs will improve Outcome: Progressing   Problem: Clinical Measurements: Goal: Ability to maintain clinical measurements within normal limits will improve Outcome: Progressing Goal: Will remain free from infection Outcome: Progressing Goal: Diagnostic test results will improve Outcome: Progressing Goal: Respiratory complications will improve Outcome: Progressing Goal: Cardiovascular complication will be avoided Outcome: Progressing   Problem: Activity: Goal: Risk for activity intolerance will decrease Outcome: Progressing   Problem: Nutrition: Goal: Adequate nutrition will be maintained Outcome: Progressing   Problem: Coping: Goal: Level of anxiety will decrease Outcome: Progressing   Problem: Elimination: Goal: Will not experience complications related to bowel motility Outcome: Progressing Goal: Will not  experience complications related to urinary retention Outcome: Progressing   Problem: Pain Managment: Goal: General experience of comfort will improve Outcome: Progressing   Problem: Safety: Goal: Ability to remain free from injury will improve Outcome: Progressing   Problem: Skin Integrity: Goal: Risk for impaired skin integrity will decrease Outcome: Progressing

## 2021-08-01 NOTE — Progress Notes (Signed)
[facility]  PROGRESS NOTE  Holly [name] LFY:[id number] DOB: 1946-03-13 DOA: 07/23/2021 PCP: Harlan Stains, MD   Brief HPI:   75 year old female with history of COPD on home oxygen, chronic steroid therapy, lung cancer diagnosed 5 years ago on intermittent palliative XRT, hypothyroidism was brought to ED because of unresponsiveness.  Patient lives with her daughter.  Daughter found patient unresponsive at home and EMS was called.  Upon arrival to ED patient had a GCS of 3 so she was intubated.  CT head was negative but venous blood gas found the patient to have PCO2 of 95.6.  Chest x-ray did not show any acute pathology and blood glucose was normal. Patient was extubated on 07/26/2021 She was transferred to [facility] service on 07/28/2021   Subjective   Patient seen and examined, breathing is slowly improving.  Diuresing well with Lasix.   Assessment/Plan:    Acute on chronic hypoxemic and hypercarbic respiratory failure -Likely from underlying COPD, patient is chronically on home O2 3 L/min and chronic prednisone  -She has history of OSA, not on CPAP -Resolved, patient is off mechanical ventilation -Still requiring 5 L of oxygen -Continue to wean off O2 -Continue prednisone 20 mg p.o. daily  Acute on chronic diastolic CHF -Patient was 6 L positive fluid overload; currently requiring 5 L/min of oxygen -She is on oxygen liters per minute at home at baseline -She was started on  home dose Lasix at 40 mg daily -Net is + 1.17 L  Septic shock -Resolved, unclear etiology -Patient was started on vancomycin and cefepime -Vancomycin has been discontinued; continue cefepime -Chest x-ray negative   Acute metabolic encephalopathy -Secondary to hypercarbia, hypoxemia -CT head showed 8 mm meningioma -EEG showed no epileptiform discharges -Metabolic encephalopathy has resolved; back to baseline  Acute kidney injury -Creatinine was 1.23 on admission; improved to 0.74 today -Likely in  setting of septic shock  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  Losartan 50 mg daily -Hydralazine 25 mg p.o. every 6 hours as needed for BP more than 160/100 -Lasix started today as above  Acute on chronic anemia with history of GI bleed -EGD on 02/17/2021 showed nonbleeding ulcer, no evidence of active GI bleed during this admission -Transfused 1 unit PRBC -GI was consulted; she has h/o esophagitis and gastric ulcers -she was started on PPI twice a day,  -Daughter did not want EGD or colonoscopy at this time as per GI note  History of MRSA right elbow -Resulted in removal of hardware -MRSA screen negative  Goals of care -Patient daughter wants to discuss with palliative care -We will consult palliative care for goals of care discussion  Scheduled medications:    ALPRAZolam  0.25 mg Oral QHS   atorvastatin  20 mg Oral Daily   buPROPion  300 mg Oral Daily   chlorhexidine  15 mL Mouth Rinse BID   docusate sodium  100 mg Oral BID   donepezil  10 mg Oral QHS   feeding supplement  1 Container Oral TID BM   furosemide  40  mg Oral Daily   heparin injection (subcutaneous)  5,000 Units Subcutaneous Q8H   levothyroxine  137 mcg Oral Q0600   losartan  50 mg Oral Daily   mouth rinse  15 mL Mouth Rinse q12n4p   metoprolol succinate  50 mg Oral Daily   pantoprazole  40 mg Oral BID   polyethylene glycol  17 g Oral Daily   predniSONE  20 mg Oral Q breakfast   sodium chloride flush  10-40  mL Intracatheter Q12H     Data Reviewed:   CBG:  Recent Labs  Lab 07/30/21 2059 07/31/21 0756 07/31/21 1204 07/31/21 1550 07/31/21 1925  GLUCAP 128* 97 153* 239* 238*    SpO2: 99 % O2 Flow Rate (L/min): 4 L/min FiO2 (%): 40 %    Vitals:   07/31/21 1926 07/31/21 2000 08/01/21 0000 08/01/21 0500  BP: (!) 152/73 (!) 144/64 (!) 156/54 (!) 169/61  Pulse: 69 72 61 61  Resp: 20 17 17 20   Temp: 98.5 F (36.9 C)  98.1 F (36.7 C) 98.1 F (36.7 C)  TempSrc: Oral  Oral Oral  SpO2: 95% 99% 100% 99%  Weight:      Height:         Intake/Output Summary (Last 24 hours) at 08/01/2021 1405 Last data filed at 08/01/2021 1300 Gross per 24 hour  Intake 480 ml  Output 3025 ml  Net -2545 ml    10/14 1901 - 10/16 0700 In: -  Out: 2326 [Urine:2325]  Filed Weights   07/29/21 0400 07/30/21 0344 07/31/21 0338  Weight: 68.4 kg 67.7 kg 67.7 kg    Data Reviewed: Basic Metabolic Panel: Recent Labs  Lab 07/25/21 1700 07/26/21 0355 07/26/21 0355 07/28/21 0221 07/29/21 1207 07/30/21 0340 07/31/21 0335 08/01/21 0322  NA  --  139   < > 140 141 141 142 143  K  --  3.5   < > 3.9 3.7 3.1* 3.4* 4.0  CL  --  101   < > 96* 97* 94* 97* 95*  CO2  --  32   < > 38* 37* 40* 38* 42*  GLUCOSE  --  204*   < > 88 89 140* 96 92  BUN  --  27*   < > 16 14 15 10 10   CREATININE  --  1.09*   < > 0.74 0.78 0.74 0.60 0.79  CALCIUM  --  8.9   < > 9.7 9.5 9.3 9.6 10.1  MG 2.4 2.3  --   --   --   --   --   --   PHOS 3.3 3.3  --   --   --   --   --   --    < > = values in this interval not displayed.   Liver Function Tests: No results for input(s): AST, ALT, ALKPHOS, BILITOT, PROT, ALBUMIN in the last 168 hours.  No results for input(s): LIPASE, AMYLASE in the last 168 hours. No results for input(s): AMMONIA in the last 168 hours.  CBC: Recent Labs  Lab 07/27/21 1125 07/27/21 1819 07/28/21 0221 07/28/21 0959 07/29/21 0256  WBC 7.2 7.8 8.0 8.0 6.9  HGB 9.6* 9.7* 10.3* 9.9* 9.5*  HCT  32.8* 34.3* 35.4* 33.9* 32.4*  MCV 97.9 98.6 96.7 95.2 95.9  PLT 123* 137* 139* 132* 135*   Cardiac Enzymes: No results for input(s): CKTOTAL, CKMB, CKMBINDEX, TROPONINI in the last 168 hours. BNP (last 3 results) Recent Labs    02/23/21 0502 03/22/21  2325 07/23/21 2202  BNP 53.4 48.0 147.2*    ProBNP (last 3 results) No results for input(s): PROBNP in the last 8760 hours.  CBG: Recent Labs  Lab 07/30/21 2059 07/31/21 0756 07/31/21 1204 07/31/21 1550 07/31/21 1925  GLUCAP 128* 97 153* 239* 238*       Radiology Reports  No results found.     Antibiotics: Anti-infectives (From admission, onward)    Start     Dose/Rate Route Frequency Ordered Stop   07/25/21 1300  vancomycin (VANCOREADY) IVPB 750 mg/150 mL  Status:  Discontinued        750 mg 150 mL/hr over 60 Minutes Intravenous Every 24 hours 07/24/21 1058 07/27/21 1046   07/24/21 1145  ceFEPIme (MAXIPIME) 2 g in sodium chloride 0.9 % 100 mL IVPB        2 g 200 mL/hr over 30 Minutes Intravenous Every 12 hours 07/24/21 1048 07/30/21 2135   07/24/21 1145  vancomycin (VANCOREADY) IVPB 1500 mg/300 mL        1,500 mg 150 mL/hr over 120 Minutes Intravenous  Once 07/24/21 1048 07/25/21 0028   07/24/21 1000  rifaximin (XIFAXAN) tablet 550 mg  Status:  Discontinued        550 mg Per Tube 2 times daily 07/24/21 0543 07/24/21 1048         DVT prophylaxis: Heparin  Code Status: DNR  Family Communication: Discussed with patient's daughter at bedside   Consultants:   Procedures:     Objective    Physical Examination:  General-appears in no acute distress Heart-S1-S2, regular, no murmur auscultated Lungs-clear to auscultation bilaterally, no wheezing or crackles auscultated Abdomen-soft, nontender, no organomegaly Extremities-no edema in the lower extremities Neuro-alert, oriented x3, no focal deficit noted  Status is: Inpatient  Dispo: The patient is from: Home              Anticipated d/c is  to: To be decided              Anticipated d/c date is: 08/02/2021              Patient currently not stable for discharge  Barrier to discharge-awaiting palliative care discussion with family  COVID-19 Labs  No results for input(s): DDIMER, FERRITIN, LDH, CRP in the last 72 hours.  Lab Results  Component Value Date   SARSCOV2NAA NEGATIVE 07/23/2021   [place] NEGATIVE 03/22/2021   [place] NEGATIVE 02/22/2021   [place] NEGATIVE 01/12/2021     Pressure Injury 02/16/21 Buttocks Right Stage 2 -  Partial thickness loss of dermis presenting as a shallow open injury with a red, pink wound bed without slough. (Active)  02/16/21 0230  Location: Buttocks  Location Orientation: Right  Staging: Stage 2 -  Partial thickness loss of dermis presenting as a shallow open injury with a red, pink wound bed without slough.  Wound Description (Comments):   Present on Admission: Yes        Recent Results (from the past 240 hour(s))  Culture, blood (Routine x 2)     Status: None   Collection Time: 07/23/21  8:27 PM   Specimen: BLOOD RIGHT ARM  Result Value Ref Range Status   Specimen Description BLOOD RIGHT ARM  Final   Special Requests   Final    BOTTLES DRAWN AEROBIC AND ANAEROBIC Blood Culture adequate volume   Culture   Final    NO GROWTH 5 DAYS Performed at [facility], [address] [place], [place] [address]  Report Status 07/28/2021 FINAL  Final  Resp Panel by RT-PCR (Flu A&B, Covid) Nasopharyngeal Swab     Status: None   Collection Time: 07/23/21  8:33 PM   Specimen: Nasopharyngeal Swab; Nasopharyngeal(NP) swabs in vial transport medium  Result Value Ref Range Status   SARS Coronavirus 2 by RT PCR NEGATIVE NEGATIVE Final    Comment: (NOTE) SARS-CoV-2 target nucleic acids are NOT DETECTED.  The SARS-CoV-2 RNA is generally detectable in upper respiratory specimens during the acute phase of infection. The lowest concentration of SARS-CoV-2 viral copies  this assay can detect is 138 copies/mL. A negative result does not preclude SARS-Cov-2 infection and should not be used as the sole basis for treatment or other patient management decisions. A negative result may occur with  improper specimen collection/handling, submission of specimen other than nasopharyngeal swab, presence of viral mutation(s) within the areas targeted by this assay, and inadequate number of viral copies(<138 copies/mL). A negative result must be combined with clinical observations, patient history, and epidemiological information. The expected result is Negative.  Fact Sheet for Patients:  EntrepreneurPulse.com.au  Fact Sheet for Healthcare Providers:  IncredibleEmployment.be  This test is no t yet approved or cleared by the [place] FDA and  has been authorized for detection and/or diagnosis of SARS-CoV-2 by FDA under an Emergency Use Authorization (EUA). This EUA will remain  in effect (meaning this test can be used) for the duration of the COVID-19 declaration under Section 564(b)(1) of the Act, 21 U.S.C.section 360bbb-3(b)(1), unless the authorization is terminated  or revoked sooner.       Influenza A by PCR NEGATIVE NEGATIVE Final   Influenza B by PCR NEGATIVE NEGATIVE Final    Comment: (NOTE) The Xpert Xpress SARS-CoV-2/FLU/RSV plus assay is intended as an aid in the diagnosis of influenza from Nasopharyngeal swab specimens and should not be used as a sole basis for treatment. Nasal washings and aspirates are unacceptable for Xpert Xpress SARS-CoV-2/FLU/RSV testing.  Fact Sheet for Patients: EntrepreneurPulse.com.au  Fact Sheet for Healthcare Providers: IncredibleEmployment.be  This test is not yet approved or cleared by the [place] FDA and has been authorized for detection and/or diagnosis of SARS-CoV-2 by FDA under an Emergency Use Authorization (EUA). This EUA  will remain in effect (meaning this test can be used) for the duration of the COVID-19 declaration under Section 564(b)(1) of the Act, 21 U.S.C. section 360bbb-3(b)(1), unless the authorization is terminated or revoked.  Performed at [facility], [place] [address] [place], [place] [address]   MRSA Next Gen by PCR, Nasal     Status: None   Collection Time: 07/24/21  3:08 AM   Specimen: Nasal Mucosa; Nasal Swab  Result Value Ref Range Status   MRSA by PCR Next Gen NOT DETECTED NOT DETECTED Final    Comment: (NOTE) The GeneXpert MRSA Assay (FDA approved for NASAL specimens only), is one component of a comprehensive MRSA colonization surveillance program. It is not intended to diagnose MRSA infection nor to guide or monitor treatment for MRSA infections. Test performance is not FDA approved in patients less than 28 years old. Performed at [facility], [place] [address] [place], [place] [address]     [facility] If 7PM-7AM, please contact night-coverage at [email or web address], Office  [phone]   08/01/2021, 2:05 PM  LOS: 9 days

## 2021-08-02 DIAGNOSIS — J9621 Acute and chronic respiratory failure with hypoxia: Secondary | ICD-10-CM | POA: Diagnosis not present

## 2021-08-02 DIAGNOSIS — A419 Sepsis, unspecified organism: Secondary | ICD-10-CM | POA: Diagnosis not present

## 2021-08-02 DIAGNOSIS — J9601 Acute respiratory failure with hypoxia: Secondary | ICD-10-CM | POA: Diagnosis not present

## 2021-08-02 DIAGNOSIS — J9602 Acute respiratory failure with hypercapnia: Secondary | ICD-10-CM | POA: Diagnosis not present

## 2021-08-02 LAB — BASIC METABOLIC PANEL
Anion gap: 7 (ref 5–15)
BUN: 17 mg/dL (ref 8–23)
CO2: 42 mmol/L — ABNORMAL HIGH (ref 22–32)
Calcium: 10.5 mg/dL — ABNORMAL HIGH (ref 8.9–10.3)
Chloride: 91 mmol/L — ABNORMAL LOW (ref 98–111)
Creatinine, Ser: 1.01 mg/dL — ABNORMAL HIGH (ref 0.44–1.00)
GFR, Estimated: 58 mL/min — ABNORMAL LOW (ref >60)
Glucose, Bld: 137 mg/dL — ABNORMAL HIGH (ref 70–99)
Potassium: 4 mmol/L (ref 3.5–5.1)
Sodium: 140 mmol/L (ref 135–145)

## 2021-08-02 LAB — GLUCOSE, CAPILLARY
Glucose-Capillary: 126 mg/dL — ABNORMAL HIGH (ref 70–99)
Glucose-Capillary: 125 mg/dL — ABNORMAL HIGH (ref 70–99)
Glucose-Capillary: 175 mg/dL — ABNORMAL HIGH (ref 70–99)

## 2021-08-02 MED ORDER — ENSURE ENLIVE PO LIQD: 237.0000 mL | Freq: Two times a day (BID) | ORAL | Status: DC

## 2021-08-02 MED ORDER — FUROSEMIDE 20 MG PO TABS: 20.0000 mg | ORAL_TABLET | Freq: Every day | ORAL | 3 refills | Status: DC

## 2021-08-02 MED ORDER — PANTOPRAZOLE SODIUM 40 MG PO TBEC: 40.0000 mg | DELAYED_RELEASE_TABLET | Freq: Every day | ORAL | 3 refills | Status: DC

## 2021-08-02 MED ORDER — POLYETHYLENE GLYCOL 3350 17 G PO PACK: 17.0000 g | PACK | Freq: Every day | ORAL | 0 refills | Status: DC | PRN

## 2021-08-02 MED ORDER — LOSARTAN POTASSIUM 50 MG PO TABS: 50.0000 mg | ORAL_TABLET | Freq: Every day | ORAL | 3 refills | Status: DC

## 2021-08-02 NOTE — TOC Transition Note (Signed)
Transition of Care [facility]) - CM/SW Discharge Note   Patient Details  Name: JANIT CUTTER MRN: [id number] Date of Birth: [birth year]  [facility] CM/SW Contact:  Angelita Ingles, RN Phone Number:[phone]  08/02/2021, 2:01 PM   Clinical Narrative:    Patient and daughter have decided to go home with [facility] services. CM at bedside to offer choice. HH has been set up with [facility]. No other needs noted at this time. Daughter and patient agree that they currently have all needed DME.    Final next level of care: [place] Barriers to Discharge: No Barriers Identified   Patient Goals and CMS Choice Patient states their goals for this hospitalization and ongoing recovery are:: Just wants to get better to go home CMS Medicare.gov Compare Post Acute Care list provided to:: Patient Represenative (must comment) Choice offered to / list presented to : Patient, Adult Children  Discharge Placement                       Discharge Plan and Services In-house Referral: NA Discharge Planning Services: CM Consult Post Acute Care Choice: NA          DME Arranged: N/A DME Agency: NA     Representative spoke with at DME Agency: [name] HH Arranged: PT [place]: Short Date [place]: 08/02/21 Time [place]: 1401 Representative spoke with at [place]: [place] ([place]) Interventions     Readmission Risk Interventions Readmission Risk Prevention Plan 07/28/2021 02/18/2021  Transportation Screening Complete Complete  PCP or Specialist Appt within 5-7 Days - Complete  Home Care Screening - Complete  Medication Review (RN CM) - Complete  Medication Review (RN Transport planner) Referral to Pharmacy -  PCP or Specialist appointment within 3-5 days of discharge Complete -  [place] or Home Care Consult Patient refused -  SW Recovery Care/Counseling Consult Complete -  Palliative Care Screening Not Applicable -   [place] Not Applicable -  Some recent data might be hidden

## 2021-08-02 NOTE — Progress Notes (Signed)
Physical Therapy Treatment Patient Details Name: LENETTA PICHE MRN: [id number] DOB: 1946/05/14 Today's Date: 08/02/2021   History of Present Illness 75 yo female presents to [facility]7 with unresponsiveness, found by daughter, arrives with GCS of 3. Pt with working diagnoses of acute on chronic respiratory failure, septic shock, acute metabolic encephalopathy. ETT 10/7-10/10. PMH includes COPD on home oxygen, hypertension, reported lung cancer on palliative radiation, prior GIB, anxiety, urinary incontinence, cervicalgia, anxiety, osteopenia, thyroidectomy 2015, ORIF R elbow fx 2019.    PT Comments    Pt progressing towards all goals. Remains to have decreased activity tolerance requiring freq seated rest breaks t/o session. Pts SpO2 did stay above 97% on 3LO2 via [place]. Spoke with daughter and pt regarding d/c plans. They agreed on HHPT and 24/7 assist. Daughter reports that her brother will be there when she is working. Acute PT to cont to follow.    Recommendations for follow up therapy are one component of a multi-disciplinary discharge planning process, led by the attending physician.  Recommendations may be updated based on patient status, additional functional criteria and insurance authorization.  Follow Up Recommendations  Home health PT;Supervision/Assistance - 24 hour     Equipment Recommendations  None recommended by PT (has RW)    Recommendations for Other Services       Precautions / Restrictions Precautions Precautions: Fall Restrictions Weight Bearing Restrictions: No     Mobility  Bed Mobility Overal bed mobility: Needs Assistance Bed Mobility: Supine to Sit     Supine to sit: Supervision     General bed mobility comments: increased time but able to complete without physical assist    Transfers Overall transfer level: Needs assistance Equipment used: Rolling walker (2 wheeled) Transfers: Sit to/from Stand Sit to Stand: Min guard         General  transfer comment: verbal cues for hand placement, poor carry over t/o session requiring freq verbal cues  Ambulation/Gait Ambulation/Gait assistance: Min guard;+2 safety/equipment (chair follow and line management) Gait Distance (Feet): 30 Feet (x4) Assistive device: Rolling walker (2 wheeled) Gait Pattern/deviations: Step-through pattern;Decreased stride length;Trunk flexed Gait velocity: dec Gait velocity interpretation: <1.31 ft/sec, indicative of household ambulator General Gait Details: freq v/c's to relax shoulders and trust her legs, pt requiring seated rest breaks every 30' due to SOB/fatigue, SpO2 >97% on 3Lo2 via [place] t/o session   Stairs             Wheelchair Mobility    Modified Rankin (Stroke Patients Only)       Balance Overall balance assessment: Needs assistance Sitting-balance support: No upper extremity supported Sitting balance-Leahy Scale: Good     Standing balance support: During functional activity Standing balance-Leahy Scale: Poor Standing balance comment: dep on RW for safe amb                            Cognition Arousal/Alertness: Awake/alert Behavior During Therapy: WFL for tasks assessed/performed Overall Cognitive Status: Impaired/Different from baseline Area of Impairment: Memory                     Memory: Decreased short-term memory                Exercises Other Exercises Other Exercises: pt given HEP of "up downs", 10 reps, 3x/day, ankle pumps, LAQ, marching in sitting, up/down at wrist, elbow and shoulders    General Comments General comments (skin integrity, edema, etc.): pt 3LO2  [place] with SpO2 >97% t/o session. pt assisted to the toliet, unable to perform hygiene s/p BM requiring PT assist      Pertinent Vitals/Pain Pain Assessment: No/denies pain    Home Living                      Prior Function            PT Goals (current goals can now be found in the care plan section) Progress  towards PT goals: Progressing toward goals    Frequency    Min 3X/week      PT Plan Discharge plan needs to be updated    Co-evaluation              AM-PAC PT "6 Clicks" Mobility   Outcome Measure  Help needed turning from your back to your side while in a flat bed without using bedrails?: None Help needed moving from lying on your back to sitting on the side of a flat bed without using bedrails?: A Little Help needed moving to and from a bed to a chair (including a wheelchair)?: A Little Help needed standing up from a chair using your arms (e.g., wheelchair or bedside chair)?: A Little Help needed to walk in hospital room?: A Little Help needed climbing 3-5 steps with a railing? : A Little 6 Click Score: 19    End of Session Equipment Utilized During Treatment: Oxygen Activity Tolerance: Patient limited by fatigue Patient left: with call bell/phone within reach;with bed alarm set;in chair;with family/visitor present Nurse Communication: Mobility status PT Visit Diagnosis: Other abnormalities of gait and mobility (R26.89)     Time: 8144-8185 PT Time Calculation (min) (ACUTE ONLY): 43 min  Charges:  $Gait Training: 23-37 mins $Therapeutic Exercise: 8-22 mins                     Kittie Plater, PT, DPT Acute Rehabilitation Services Pager #: [phone] Office #: [phone]    [place]line Lopes 08/02/2021, 12:14 PM

## 2021-08-02 NOTE — Progress Notes (Signed)
This chaplain responded PMT PA-JC consult for Pt. spiritual care.  The Pt. is sitting up in the bedside recliner with no complaints and looking forward to hearing more plans about discharge.  The Pt. is open to the spiritual care visit and remains interested in the PMT revisiting during this admission and out patient care. The chaplain understands the Pt. is desiring the support of Palliative Care but does not want any changes in her medical team.   The Pt. daughter-[name] joins the spiritual care conversation. The Pt. openly appreciates [name]'s companionship and is looking forward to her son adding to the team of caregivers at home.  The chaplain reflectively listened as the Pt. uncovered her own thoughts and understanding of religion, end of life, salvation, and forgiveness.  The chaplain accepted the Pt. invitation to pray with the Pt. and family.  Chaplain Sallyanne Kuster [phone]

## 2021-08-02 NOTE — Progress Notes (Signed)
Nutrition Follow-up  DOCUMENTATION CODES:   Not applicable  INTERVENTION:  -d/c Boost Breeze po TID, each supplement provides 250 kcal and 9 grams of protein  -Ensure Enlive po BID, each supplement provides 350 kcal and 20 grams of protein -NIKE Essentials mixed with skim milk BID with meals.  NUTRITION DIAGNOSIS:   Inadequate oral intake related to acute illness as evidenced by NPO status.  Ongoing   GOAL:   Patient will meet greater than or equal to 90% of their needs  progressing  MONITOR:   Vent status, TF tolerance, Labs, Weight trends  REASON FOR ASSESSMENT:   Ventilator, Consult Enteral/tube feeding initiation and management  ASSESSMENT:   75 yo female admitted with acute on chronic respiratory failure requiring intubation, septic shockPMH includes COPD on home oxygen, lung cancer dx 5 years ago on intermittent palliative XRT, PVD, chronic anemia, HLD  10/10 - extubated 10/11 - diet adv to clear liquids 10/12 - tx to TRH, diet adv to carb modified  Pt's breathing slowly improving and pt diuresing well per MD. PMT met with pt who would like to continue with limited scope, declines artifical nutrition. Will continue to provide oral nutrition supplements but will adjust regimen given diet advanced beyond clears. Note pt has done well with Boost Breeze TID.   PO Intake: 50-100% x 6 recorded meals (~71% avg meal intake)  UOP: 2126m x24 hours I/O: +3763msince admit  Medications: SSI TID w/ meals, colace, Protonix, Miralax, prednisone Labs reviewed.  CBGs: 121-239 x 24 hours  Diet Order:   Diet Order             Diet Carb Modified Fluid consistency: Thin; Room service appropriate? Yes  Diet effective now                   EDUCATION NEEDS:   Not appropriate for education at this time  Skin:  Skin Assessment: Reviewed RN Assessment  Last BM:  10/15  Height:   Ht Readings from Last 1 Encounters:  07/27/21 _0  (1.626 m)     Weight:   Wt Readings from Last 1 Encounters:  08/02/21 65.4 kg    BMI:  Body mass index is 24.75 kg/m.  Estimated Nutritional Needs:   Kcal:  1550-1750  Protein:  90-110 gm  Fluid:  1.6-1.8 L    AmLarkin InaMS, RD, LDN (she/her/hers) RD pager number and weekend/on-call pager number located in [facility]

## 2021-08-02 NOTE — Discharge Summary (Signed)
Physician Discharge Summary  Holly [name] [place] BWL:[id number] DOB: 07/20/1946 DOA: 07/23/2021  PCP: Harlan Stains, MD  Admit date: 07/23/2021 Discharge date: 08/02/2021  Time spent: 60 minutes  Recommendations for Outpatient Follow-up:  Follow-up PCP in 1 week Will need BMP as outpatient at PCP office in 1 week   Discharge Diagnoses:  Active Problems:   Acute on chronic respiratory failure (HCC)   Goals of care, counseling/discussion   Acute respiratory failure with hypoxia and hypercapnia (HCC)   Septic shock (HCC)   Discharge Condition: Stable  Diet recommendation: Heart healthy diet  Filed Weights   07/30/21 0344 07/31/21 0338 08/02/21 0500  Weight: 67.7 kg 67.7 kg 65.4 kg    History of present illness:  75 year old female with history of COPD on home oxygen, chronic steroid therapy, lung cancer diagnosed 5 years ago on intermittent palliative XRT, hypothyroidism was brought to ED because of unresponsiveness.  Patient lives with her daughter.  Daughter found patient unresponsive at home and EMS was called.  Upon arrival to ED patient had a GCS of 3 so she was intubated.  CT head was negative but venous blood gas found the patient to have PCO2 of 95.6.  Chest x-ray did not show any acute pathology and blood glucose was normal. Patient was extubated on 07/26/2021 She was transferred to [facility] service on 07/28/2021    Hospital Course:   Acute on chronic hypoxemic and hypercarbic respiratory failure -Likely from underlying COPD, patient is chronically on home O2 3 L/min and chronic prednisone  -She has history of OSA, not on CPAP -Resolved, patient is off mechanical ventilation -Now requiring 3 L/min of oxygen, at baseline -Continue prednisone 20 mg p.o. daily   Acute on chronic diastolic CHF -Patient was 6 L positive fluid overload; equiring 5 L/min of oxygen -She is on oxygen 3 liters per minute at home at baseline -She was started on  home dose Lasix at 40 mg daily -At this  time she has diuresed well with Lasix and is currently at baseline -We will cut down the dose of Lasix 20 mg p.o. daily since she has slight worsening of her creatinine -She will need repeat BMP in 1 week -Recommended to hold Lasix for 3 days and start taking from 08/05/2021  Septic shock -Resolved, unclear etiology -Patient was started on vancomycin and cefepime -Antibiotics have been discontinued -Blood cultures remain negative, chest x-ray negative  Acute metabolic encephalopathy -Secondary to hypercarbia, hypoxemia -CT head showed 8 mm meningioma -EEG showed no epileptiform discharges -Metabolic encephalopathy has resolved; back to baseline -We will discontinue BuSpar, mirtazapine   Acute kidney injury -Creatinine was 1.23 on admission;  -Resolved, likely from Lasix -Today creatinine is 1.01, dose of Lasix has been changed as above   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 daily   Hypokalemia -Replete    Hypertension -Blood pressure is elevated -Continue metoprolol 50 mg daily.  Losartan 50 mg daily    Acute on chronic anemia with history of GI bleed -EGD  on 02/17/2021 showed nonbleeding ulcer, no evidence of active GI bleed during this admission -Transfused 1 unit PRBC -GI was consulted; she has h/o esophagitis and gastric ulcers -she was started on PPI twice a day,  -Daughter did not want EGD or colonoscopy at this time as per GI note -We will change Protonix to  40 mg p.o. daily   History of MRSA right elbow -Resulted in removal of hardware -MRSA screen negative   Patient to go home with home health PT    Procedures: Echocardiogram  Consultations: PCCM  Discharge Exam: Vitals:   08/01/21 1902 08/02/21 0800  BP:  (!) 130/54  Pulse:  70  Resp:    Temp:    SpO2: 100% 98%    General: Appears in no acute distress Cardiovascular: S1-S2, regular, no murmur auscultated Respiratory: Clear to auscultation bilaterally  Discharge Instructions   Discharge Instructions     Diet - low sodium heart healthy   Complete by: As directed    Discharge instructions   Complete by: As directed    Hold Lasix for 3 days, start taking from 08/05/2021.  Have blood work check,  BMP in 1 week at PCP office   Discharge wound care:   Complete by: As directed    Clean the right shin wound with NS, pat dry. Apply Xeroform gauze over the open wound and secure with foam dressing. Carefully remove any foam dressings to prevent skin tears. If any new skin tears, may apply the Xeroform gauze over the tear before apply the foam dressing.   Increase activity slowly   Complete by: As directed       Allergies as of 08/02/2021       Reactions   Amlodipine Besylate Swelling   Edema   Codeine Nausea And Vomiting   Oxycodone-acetaminophen Nausea And Vomiting        Medication List     STOP taking these medications    busPIRone 30 MG tablet Commonly known as: BUSPAR   mirtazapine 15 MG tablet Commonly known as: REMERON   nystatin 100000 UNIT/ML suspension Commonly known as: MYCOSTATIN   potassium chloride 10 MEQ tablet Commonly known as: KLOR-CON       TAKE these medications    ALPRAZolam 0.5 MG tablet Commonly known as: XANAX Take 0.5 mg by mouth 2 (two) times daily as needed for anxiety.   atorvastatin 20 MG tablet Commonly known as: LIPITOR Take 20 mg by mouth at bedtime.   buPROPion 300 MG 24 hr tablet Commonly known as:  WELLBUTRIN XL Take 300 mg by mouth every morning.   donepezil 10 MG tablet Commonly known as: ARICEPT Take 10 mg by mouth at bedtime.   Fluticasone-Umeclidin-Vilant 100-62.5-25 MCG/INH Aepb Inhale 1 puff into the lungs daily. Trelegy   furosemide 20 MG tablet Commonly known as: LASIX Take 1 tablet (20 mg total) by mouth daily. Start taking on: August 05, 2021 What changed:  These instructions start on August 05, 2021. If you are unsure what to do until then, ask your doctor or other care provider. Another medication with the same name was removed. Continue taking this medication, and follow the directions you see here.   ipratropium-albuterol 0.5-2.5 (3) MG/3ML Soln Commonly known as: DUONEB Inhale 3 mLs into the lungs every 4 (four) hours as needed (respiratory issues.).   levothyroxine 137 MCG tablet Commonly known as: SYNTHROID Take 137 mcg by mouth daily before breakfast.   losartan 50 MG tablet Commonly known as: COZAAR Take 1  tablet (50 mg total) by mouth daily. Start taking on: August 03, 2021   metoprolol succinate 50 MG 24 hr tablet Commonly known as: TOPROL-XL Take 50 mg by mouth daily. Take with or immediately following a meal.   pantoprazole 40 MG tablet Commonly known as: PROTONIX Take 1 tablet (40 mg total) by mouth daily. What changed: when to take this   polyethylene glycol 17 g packet Commonly known as: MIRALAX / GLYCOLAX Take 17 g by mouth daily as needed for moderate constipation.   predniSONE 20 MG tablet Commonly known as: DELTASONE Take 20 mg by mouth daily.   Ventolin HFA 108 (90 Base) MCG/ACT inhaler Generic drug: albuterol INHALE 2 PUFFS INTO THE LUNGS EVERY 4 HOURS AS NEEDED FOR WHEEZING ORSHORTNESS OF BREATH What changed: See the new instructions.               Discharge Care Instructions  (From admission, onward)           Start     Ordered   08/02/21 0000  Discharge wound care:       Comments: Clean the right shin  wound with NS, pat dry. Apply Xeroform gauze over the open wound and secure with foam dressing. Carefully remove any foam dressings to prevent skin tears. If any new skin tears, may apply the Xeroform gauze over the tear before apply the foam dressing.   08/02/21 1448           Allergies  Allergen Reactions   Amlodipine Besylate Swelling    Edema   Codeine Nausea And Vomiting   Oxycodone-Acetaminophen Nausea And Vomiting    Follow-up Information     [facility] [place] Follow up.   Specialty: Home Health Services Why: Your home health has been set up with [facility]. The office will call you with start of care info. Idf you have any questions please call number listed above. Contact information: [place] [address] [phone]         Harlan Stains, MD Follow up in 1 week(s).   Specialty: Family Medicine Why: Check BMP as outpatient in 1 week Contact information: [place], [place] [address] [phone]                  The results of significant diagnostics from this hospitalization (including imaging, microbiology, ancillary and laboratory) are listed below for reference.    Significant Diagnostic Studies: CT HEAD WO CONTRAST (5MM)  Result Date: 07/24/2021 CLINICAL DATA:  Mental status change, persistent or worsening. EXAM: CT HEAD WITHOUT CONTRAST TECHNIQUE: Contiguous axial images were obtained from the base of the skull through the vertex without intravenous contrast. COMPARISON:  Head CT 07/23/2021.  Brain MRI 10/05/2020. FINDINGS: Brain: Mild generalized cerebral and cerebellar atrophy. Redemonstrated 8 mm rounded dural-based calcified focus overlying the right frontal lobe, suspicious for an incidental meningioma (series 5, image 23). Advanced patchy and confluent hypoattenuation within the cerebral white matter, nonspecific but compatible chronic small vessel ischemic disease. Gray-white differentiation  is preserved. No evidence of acute cortical infarct. There is no acute intracranial hemorrhage. No extra-axial fluid collection. No midline shift. Vascular: No hyperdense vessel or unexpected calcification. Skull: No calvarial fracture or focal suspicious osseous lesion. Sinuses/Orbits: Visualized orbits show no acute finding. Trace mucosal thickening within the bilateral ethmoid and sphenoid sinuses. Other: Minimal fluid within the bilateral mastoid air cells. IMPRESSION: No evidence of acute intracranial hemorrhage or acute infarction. No CT evidence of  acute hypoxic/ischemic injury. Redemonstrated 8 mm rounded dural-based calcified focus overlying the right frontal lobe, suspicious for an incidental meningioma. Severe chronic small vessel ischemic changes within the cerebral white matter. Mild generalized parenchymal atrophy. Minimal paranasal sinus mucosal thickening, as described. Trace fluid within the bilateral mastoid air cells. Electronically Signed   By: Kellie Simmering D.O.   On: 07/24/2021 13:11   CT HEAD WO CONTRAST (5MM)  Result Date: 07/23/2021 CLINICAL DATA:  Mental status change EXAM: CT HEAD WITHOUT CONTRAST TECHNIQUE: Contiguous axial images were obtained from the base of the skull through the vertex without intravenous contrast. COMPARISON:  MRI 10/13/2020, CT brain 09/29/2020 FINDINGS: Brain: No acute territorial infarction, hemorrhage or intracranial mass. Mild atrophy. Extensive white matter hypodensity consistent with chronic small vessel ischemic change. Stable ventricle size Vascular: No hyperdense vessels.  Carotid vascular calcification Skull: Normal. Negative for fracture or focal lesion. Sinuses/Orbits: Patchy mucosal thickening in the sinuses Other: None IMPRESSION: 1. No CT evidence for acute intracranial abnormality. 2. Mild atrophy. Extensive chronic small vessel ischemic changes of the white matter Electronically Signed   By: Donavan Foil M.D.   On: 07/23/2021 21:14   DG CHEST  PORT 1 VIEW  Result Date: 07/24/2021 CLINICAL DATA:  75 year old female status post central line placement. EXAM: PORTABLE CHEST 1 VIEW COMPARISON:  Chest x-ray 07/23/2021. FINDINGS: An endotracheal tube is in place with tip 4.2 cm above the carina. There is a left-sided internal jugular central venous catheter with tip terminating in the distal superior vena cava. A nasogastric tube is seen extending into the stomach, however, the tip of the nasogastric tube extends below the lower margin of the image. Lung volumes are normal. No consolidative airspace disease. No pleural effusions. No pneumothorax. Persistent mass projecting over the lower right lung, likely in the lateral segment of the right middle lobe based on comparison with prior chest CT 02/22/2021, compatible with progressive neoplasm. No other definite suspicious appearing pulmonary nodules or masses are noted. No evidence of pulmonary edema. Heart size is normal. Upper mediastinal contours are within normal limits. IMPRESSION: 1. Support apparatus, as above. 2. Persistent right middle lobe mass highly concerning for neoplasm, either a primary bronchogenic carcinoma or solitary metastasis. Once again, further evaluation with chest CT is strongly recommended in the near future. Electronically Signed   By: Vinnie Langton M.D.   On: 07/24/2021 05:45   DG Chest Portable 1 View  Result Date: 07/23/2021 CLINICAL DATA:  Altered mental status.  Intubation. EXAM: PORTABLE CHEST 1 VIEW COMPARISON:  Chest x-ray 03/22/2020, CT angio chest 02/22/2021 FINDINGS: Interval placement endotracheal tube with tip approximately 3 cm above the carina. Interval placement of an enteric tube coursing below the hemidiaphragm with tip and side port collimated off view. The heart and mediastinal contours are unchanged. Atherosclerotic plaque. Hilar vasculature prominence. Slight hyperinflation of the lungs. Interval increase in size of a right lung 2.5 cm density. No  pulmonary edema. No pleural effusion. No pneumothorax. No acute osseous abnormality. Vascular clips overlie the neck. IMPRESSION: 1. Endotracheal tube with tip approximately 3 cm above the carina. 2. Enteric tube coursing below the hemidiaphragm with tip and side port collimated off view. 3. Interval increase in size of a right lung 2.5 cm density. Recommend CT chest for further evaluation. Electronically Signed   By: Iven Finn M.D.   On: 07/23/2021 21:00   EEG adult  Result Date: 07/25/2021 Lora Havens, MD     07/25/2021  1:40 PM Patient Name: Holly [name] MRN: [id number] Epilepsy Attending: Lora Havens Referring Physician/Provider: Dr Margaretha Seeds Date: 07/25/2021 Duration: 24.46 mins Patient history: 75yo F with ams. EEG to evaluate for seizure Level of alertness:  lethargic AEDs during EEG study: propofol Technical aspects: This EEG study was done with scalp electrodes positioned according to the 10-20 International system of electrode placement. Electrical activity was acquired at a sampling rate of 500Hz  and reviewed with a high frequency filter of 70Hz  and a low frequency filter of 1Hz . EEG data were recorded continuously and digitally stored. Description:  EEG showed continuous generalized polymorphic sharply contoured 3 to 6 Hz theta-delta slowing. Generalized periodic discharges with triphasic morphology at 0.5-1Hz  were also noted.  Hyperventilation and photic stimulation were not performed.    ABNORMALITY - Periodic discharges with triphasic morphology, generalized ( GPDs) - Continuous slow, generalized  IMPRESSION: This study showed generalized periodic discharges with triphasic morphology at 0.5-1 Hz which is on the ictal-interictal continuum with low potential for seizures. This eeg pattern can also been seen due toxic-metabolic causes. Additionally there is moderate to severe diffuse encephalopathy, nonspecific etiology. No seizures were seen throughout the recording. Priyanka O  Yadav   VAS [place] ABI WITH/WO TBI  Result Date: 07/26/2021  LOWER EXTREMITY DOPPLER STUDY Patient Name:  Holly [name]  Date of Exam:   07/24/2021 Medical Rec #: [id number]      Accession #:    [id number] Date of Birth: [birth year]      Patient Gender: F Patient Age:   44 years Exam [facility]:  [facility] Procedure:      VAS [place] ABI WITH/WO TBI Referring Phys: Ileene Hutchinson RICHARDS --------------------------------------------------------------------------------  Indications: Absent pedal pulses. High Risk Factors: Hypertension, hyperlipidemia. Other Factors: COPD on home O2, lung cancer.  Limitations: Today's exam was limited due to involuntary patient movement and              Ventilation, lines. Comparison Study: No prior study Performing Technologist: Sharion Dove RVS  Examination Guidelines: A complete evaluation includes at minimum, Doppler waveform signals and systolic blood pressure reading at the level of bilateral brachial, anterior tibial, and posterior tibial arteries, when vessel segments are accessible. Bilateral testing is considered an integral part of a complete examination. Photoelectric Plethysmograph (PPG) waveforms and toe systolic pressure readings are included as required and additional duplex testing as needed. Limited examinations for reoccurring indications may be performed as noted.  ABI Findings: +---------+------------------+-----+-----------+--------+ Right    Rt Pressure (mmHg)IndexWaveform   Comment  +---------+------------------+-----+-----------+--------+ Brachial 123                    multiphasic         +---------+------------------+-----+-----------+--------+ PTA      111               0.90 biphasic            +---------+------------------+-----+-----------+--------+ DP       77                0.63 biphasic            +---------+------------------+-----+-----------+--------+ Great Toe50                0.41                      +---------+------------------+-----+-----------+--------+ +---------+------------------+-----+-----------+-----------------+ Left     Lt Pressure (mmHg)IndexWaveform   Comment           +---------+------------------+-----+-----------+-----------------+ Brachial  multiphasicRestricted/line   +---------+------------------+-----+-----------+-----------------+ PTA      88                0.72 biphasic                     +---------+------------------+-----+-----------+-----------------+ DP       65                0.53 biphasic                     +---------+------------------+-----+-----------+-----------------+ Great Toe                                  constant movement +---------+------------------+-----+-----------+-----------------+ +-------+-----------+----------------+------------+------------+ ABI/TBIToday's ABIToday's TBI     Previous ABIPrevious TBI +-------+-----------+----------------+------------+------------+ Right  0.90       0.41                                     +-------+-----------+----------------+------------+------------+ Left   0.72       unable to attain                         +-------+-----------+----------------+------------+------------+  Summary: Right: Resting right ankle-brachial index indicates mild right lower extremity arterial disease. The right toe-brachial index is abnormal. Left: Resting left ankle-brachial index indicates moderate left lower extremity arterial disease.  *See table(s) above for measurements and observations.  Electronically signed by Orlie Pollen on 07/26/2021 at 6:54:33 AM.    Final    ECHOCARDIOGRAM COMPLETE  Result Date: 07/25/2021    ECHOCARDIOGRAM REPORT   Patient Name:   Holly [name] Date of Exam: 07/25/2021 Medical Rec #:  [id number]     Height:       64.0 in Accession #:    [id number]    Weight:       152.1 lb Date of Birth:  Nov 26, 1945     BSA:          1.742 m Patient Age:    52  years      BP:           118/45 mmHg Patient Gender: F             HR:           72 bpm. Exam Location:  Inpatient Procedure: 2D Echo, 3D Echo, Cardiac Doppler and Color Doppler Indications:    Sh[facility]) [[id number]]  History:        Patient has prior history of Echocardiogram examinations, most                 recent 02/23/2021. COPD, Signs/Symptoms:Dyspnea; Risk                 Factors:Hypertension and Dyslipidemia. Thyroid disease.  Sonographer:    Darlina Sicilian RDCS Referring Phys: [id number] [facility] Pickle  Sonographer Comments: Global longitudinal strain was attempted. IMPRESSIONS  1. Left ventricular ejection fraction, by estimation, is 65 to 70%. The left ventricle has normal function. The left ventricle has no regional wall motion abnormalities. Left ventricular diastolic parameters are consistent with Grade I diastolic dysfunction (impaired relaxation).  2. Right ventricular systolic function is normal. The right ventricular size is normal. Tricuspid regurgitation signal is inadequate for assessing PA pressure.  3. The mitral valve is normal in structure. Trivial mitral  valve regurgitation. No evidence of mitral stenosis.  4. The aortic valve is tricuspid. Aortic valve regurgitation is not visualized. No aortic stenosis is present. FINDINGS  Left Ventricle: Left ventricular ejection fraction, by estimation, is 65 to 70%. The left ventricle has normal function. The left ventricle has no regional wall motion abnormalities. The left ventricular internal cavity size was normal in size. There is  no left ventricular hypertrophy. Left ventricular diastolic parameters are consistent with Grade I diastolic dysfunction (impaired relaxation). Right Ventricle: The right ventricular size is normal. Right vetricular wall thickness was not well visualized. Right ventricular systolic function is normal. Tricuspid regurgitation signal is inadequate for assessing PA pressure. Left Atrium: Left atrial size was normal in size.  Right Atrium: Right atrial size was normal in size. Pericardium: There is no evidence of pericardial effusion. Mitral Valve: The mitral valve is normal in structure. Trivial mitral valve regurgitation. No evidence of mitral valve stenosis. Tricuspid Valve: The tricuspid valve is normal in structure. Tricuspid valve regurgitation is trivial. Aortic Valve: The aortic valve is tricuspid. Aortic valve regurgitation is not visualized. No aortic stenosis is present. Pulmonic Valve: The pulmonic valve was not well visualized. Pulmonic valve regurgitation is not visualized. Aorta: The aortic root and ascending aorta are structurally normal, with no evidence of dilitation. IAS/Shunts: The interatrial septum was not well visualized.  LEFT VENTRICLE PLAX 2D LVIDd:         5.00 cm   Diastology LVIDs:         3.60 cm   LV e' medial:    6.20 cm/s LV PW:         1.00 cm   LV E/e' medial:  15.0 LV IVS:        0.70 cm   LV e' lateral:   7.40 cm/s LVOT diam:     1.90 cm   LV E/e' lateral: 12.6 LV SV:         77 LV SV Index:   44 LVOT Area:     2.84 cm                           3D Volume EF:                          3D EF:        69 %                          LV EDV:       114 ml                          LV ESV:       35 ml                          LV SV:        79 ml RIGHT VENTRICLE RV S prime:     15.20 cm/s TAPSE (M-mode): 1.8 cm LEFT ATRIUM             Index        RIGHT ATRIUM           Index LA diam:        3.20 cm 1.84 cm/m   RA Area:     10.70 cm LA Vol (A2C):   31.8 ml  18.26 ml/m  RA Volume:   22.20 ml  12.75 ml/m LA Vol (A4C):   23.8 ml 13.67 ml/m LA Biplane Vol: 30.4 ml 17.46 ml/m  AORTIC VALVE LVOT Vmax:   147.00 cm/s LVOT Vmean:  92.300 cm/s LVOT VTI:    0.271 m  AORTA Ao Root diam: 2.60 cm Ao Asc diam:  3.00 cm MITRAL VALVE MV Area (PHT): 3.11 cm     SHUNTS MV Decel Time: 244 msec     Systemic VTI:  0.27 m MV E velocity: 92.90 cm/s   Systemic Diam: 1.90 cm MV A velocity: 110.00 cm/s MV E/A ratio:  0.84  Oswaldo Milian MD Electronically signed by Oswaldo Milian MD Signature Date/Time: 07/25/2021/1:42:42 PM    Final    CT CHEST ABDOMEN PELVIS WO CONTRAST  Result Date: 07/24/2021 CLINICAL DATA:  Sepsis, found unresponsive at home EXAM: CT CHEST, ABDOMEN AND PELVIS WITHOUT CONTRAST TECHNIQUE: Multidetector CT imaging of the chest, abdomen and pelvis was performed following the standard protocol without IV contrast. Sagittal and coronal MPR images reconstructed from axial data set. No oral contrast administered. COMPARISON:  CT angio chest 02/22/2021, CT abdomen and pelvis 08/12/2014 FINDINGS: CT CHEST FINDINGS Cardiovascular: Atherosclerotic calcifications aorta, proximal great vessels and coronary arteries. Aorta normal caliber. Heart size normal. No pericardial effusion. Mediastinum/Nodes: Nasogastric tube traverses esophagus into stomach. Tip of endotracheal tube just above carina. Prior thyroidectomy. No thoracic adenopathy. Lungs/Pleura: Emphysematous changes and central peribronchial thickening consistent with COPD. RIGHT middle lobe mass 2.0 x 1.7 x 2.4 cm increased since 10 mm diameter on 02/22/2021 minimal atelectasis RIGHT lower lobe. Scarring in LEFT upper lobe stable. No pulmonary infiltrate pleural effusion, or pneumothorax. Musculoskeletal: Osseous demineralization. No focal osseous lesions. CT ABDOMEN PELVIS FINDINGS Hepatobiliary: Gallbladder and liver normal appearance Pancreas: Normal appearance Spleen: Normal appearance Adrenals/Urinary Tract: LEFT adrenal mass 15 x 12 mm unchanged low-attenuation consistent with adrenal adenoma. Adrenal glands, kidneys, and ureters otherwise normal appearance. Bladder decompressed by Foley catheter. Stomach/Bowel: Diverticulosis of descending and sigmoid colon. Minimal wall thickening sigmoid colon. Questionable subtle pericolic inflammatory changes. Cannot exclude subtle diverticulitis. Stomach decompressed by NG tube. Remaining bowel loops  unremarkable. Normal appendix. Vascular/Lymphatic: Atherosclerotic calcifications aorta and iliac arteries without aneurysm. No adenopathy Reproductive: Uterus surgically absent. Questionable visualization of an atrophic LEFT ovary. Other: No free air or free fluid.  No hernia. Musculoskeletal: Osseous demineralization. IMPRESSION: Increased size of a RIGHT middle lobe mass 2.0 x 1.7 x 2.4 cm, previously 10 mm diameter on 02/22/2021, suspicious for a pulmonary neoplasm. COPD changes with minimal RIGHT lower lobe atelectasis. LEFT adrenal adenoma. Distal colonic diverticulosis with questionable subtle wall thickening and pericolic inflammatory changes at the sigmoid colon, cannot exclude subtle diverticulitis. No other acute intrathoracic, intra-abdominal or intrapelvic abnormalities. Aortic Atherosclerosis (ICD10-I70.0). Electronically Signed   By: Lavonia Dana M.D.   On: 07/24/2021 13:21    Microbiology: Recent Results (from the past 240 hour(s))  Culture, blood (Routine x 2)     Status: None   Collection Time: 07/23/21  8:27 PM   Specimen: BLOOD RIGHT ARM  Result Value Ref Range Status   Specimen Description BLOOD RIGHT ARM  Final   Special Requests   Final    BOTTLES DRAWN AEROBIC AND ANAEROBIC Blood Culture adequate volume   Culture   Final    NO GROWTH 5 DAYS Performed at [facility], [address], [place], [place] [address]    Report Status 07/28/2021 FINAL  Final  Resp Panel by RT-PCR (Flu  A&B, Covid) Nasopharyngeal Swab     Status: None   Collection Time: 07/23/21  8:33 PM   Specimen: Nasopharyngeal Swab; Nasopharyngeal(NP) swabs in vial transport medium  Result Value Ref Range Status   SARS Coronavirus 2 by RT PCR NEGATIVE NEGATIVE Final    Comment: (NOTE) SARS-CoV-2 target nucleic acids are NOT DETECTED.  The SARS-CoV-2 RNA is generally detectable in upper respiratory specimens during the acute phase of infection. The lowest concentration of SARS-CoV-2 viral copies this  assay can detect is 138 copies/mL. A negative result does not preclude SARS-Cov-2 infection and should not be used as the sole basis for treatment or other patient management decisions. A negative result may occur with  improper specimen collection/handling, submission of specimen other than nasopharyngeal swab, presence of viral mutation(s) within the areas targeted by this assay, and inadequate number of viral copies(<138 copies/mL). A negative result must be combined with clinical observations, patient history, and epidemiological information. The expected result is Negative.  Fact Sheet for Patients:  EntrepreneurPulse.com.au  Fact Sheet for Healthcare Providers:  IncredibleEmployment.be  This test is no t yet approved or cleared by the [place] FDA and  has been authorized for detection and/or diagnosis of SARS-CoV-2 by FDA under an Emergency Use Authorization (EUA). This EUA will remain  in effect (meaning this test can be used) for the duration of the COVID-19 declaration under Section 564(b)(1) of the Act, 21 U.S.C.section 360bbb-3(b)(1), unless the authorization is terminated  or revoked sooner.       Influenza A by PCR NEGATIVE NEGATIVE Final   Influenza B by PCR NEGATIVE NEGATIVE Final    Comment: (NOTE) The Xpert Xpress SARS-CoV-2/FLU/RSV plus assay is intended as an aid in the diagnosis of influenza from Nasopharyngeal swab specimens and should not be used as a sole basis for treatment. Nasal washings and aspirates are unacceptable for Xpert Xpress SARS-CoV-2/FLU/RSV testing.  Fact Sheet for Patients: EntrepreneurPulse.com.au  Fact Sheet for Healthcare Providers: IncredibleEmployment.be  This test is not yet approved or cleared by the [place] FDA and has been authorized for detection and/or diagnosis of SARS-CoV-2 by FDA under an Emergency Use Authorization (EUA). This EUA will  remain in effect (meaning this test can be used) for the duration of the COVID-19 declaration under Section 564(b)(1) of the Act, 21 U.S.C. section 360bbb-3(b)(1), unless the authorization is terminated or revoked.  Performed at [facility], [place] [address], [place], [place] [address]   MRSA Next Gen by PCR, Nasal     Status: None   Collection Time: 07/24/21  3:08 AM   Specimen: Nasal Mucosa; Nasal Swab  Result Value Ref Range Status   MRSA by PCR Next Gen NOT DETECTED NOT DETECTED Final    Comment: (NOTE) The GeneXpert MRSA Assay (FDA approved for NASAL specimens only), is one component of a comprehensive MRSA colonization surveillance program. It is not intended to diagnose MRSA infection nor to guide or monitor treatment for MRSA infections. Test performance is not FDA approved in patients less than 66 years old. Performed at [facility], [place] [address] [place], [place] [address]      Labs: Basic Metabolic Panel: Recent Labs  Lab 07/29/21 1207 07/30/21 0340 07/31/21 0335 08/01/21 0322 08/02/21 1252  NA 141 141 142 143 140  K 3.7 3.1* 3.4* 4.0 4.0  CL 97* 94* 97* 95* 91*  CO2 37* 40* 38* 42* 42*  GLUCOSE 89 140* 96 92 137*  BUN 14 15 10 10  17  CREATININE 0.78 0.74 0.60 0.79 1.01*  CALCIUM 9.5 9.3 9.6 10.1 10.5*   Liver Function Tests: No results for input(s): AST, ALT, ALKPHOS, BILITOT, PROT, ALBUMIN in the last 168 hours. No results for input(s): LIPASE, AMYLASE in the last 168 hours. No results for input(s): AMMONIA in the last 168 hours. CBC: Recent Labs  Lab 07/27/21 1125 07/27/21 1819 07/28/21 0221 07/28/21 0959 07/29/21 0256  WBC 7.2 7.8 8.0 8.0 6.9  HGB 9.6* 9.7* 10.3* 9.9* 9.5*  HCT 32.8* 34.3* 35.4* 33.9* 32.4*  MCV 97.9 98.6 96.7 95.2 95.9  PLT 123* 137* 139* 132* 135*   Cardiac Enzymes: No results for input(s): CKTOTAL, CKMB, CKMBINDEX, TROPONINI in the last 168 hours. BNP: BNP (last 3 results) Recent Labs    02/23/21 0502  03/22/21 2325 07/23/21 2202  BNP 53.4 48.0 147.2*    ProBNP (last 3 results) No results for input(s): PROBNP in the last 8760 hours.  CBG: Recent Labs  Lab 07/31/21 1925 08/01/21 1537 08/01/21 2036 08/02/21 0754 08/02/21 1152  GLUCAP 238* 163* 121* 126* 125*       Signed:  Oswald Hillock MD.  [facility] 08/02/2021, 3:06 PM

## 2021-08-02 NOTE — Progress Notes (Addendum)
Physician Discharge Summary  Navina Wohlers Mcmullen LYY:[id number] DOB: [birth year] DOA: 07/23/2021  PCP: Harlan Stains, MD  Admit date: 07/23/2021 Discharge date: 08/02/2021  Time spent: 60 minutes  Recommendations for Outpatient Follow-up:  Follow-up PCP in 1 week Will need BMP as outpatient at PCP office in 1 week   Discharge Diagnoses:  Active Problems:   Acute on chronic respiratory failure (HCC)   Goals of care, counseling/discussion   Acute respiratory failure with hypoxia and hypercapnia (HCC)   Septic shock (HCC)   Discharge Condition: Stable  Diet recommendation: Heart healthy diet  Filed Weights   07/30/21 0344 07/31/21 0338 08/02/21 0500  Weight: 67.7 kg 67.7 kg 65.4 kg    History of present illness:  75 year old female with history of COPD on home oxygen, chronic steroid therapy, lung cancer diagnosed 5 years ago on intermittent palliative XRT, hypothyroidism was brought to ED because of unresponsiveness.  Patient lives with her daughter.  Daughter found patient unresponsive at home and EMS was called.  Upon arrival to ED patient had a GCS of 3 so she was intubated.  CT head was negative but venous blood gas found the patient to have PCO2 of 95.6.  Chest x-ray did not show any acute pathology and blood glucose was normal. Patient was extubated on 07/26/2021 She was transferred to [facility] service on 07/28/2021    Hospital Course:   Acute on chronic hypoxemic and hypercarbic respiratory failure -Likely from underlying COPD, patient is chronically on home O2 3 L/min and chronic prednisone  -She has history of OSA, not on CPAP -Resolved, patient is off mechanical ventilation -Now requiring 3 L/min of oxygen, at baseline -Continue prednisone 20 mg p.o. daily   Acute on chronic diastolic CHF -Patient was 6 L positive fluid overload; equiring 5 L/min of oxygen -She is on oxygen 3 liters per minute at home at baseline -She was started on  home dose Lasix at 40 mg daily -At this  time she has diuresed well with Lasix and is currently at baseline -We will cut down the dose of Lasix 20 mg p.o. daily since she has slight worsening of her creatinine -She will need repeat BMP in 1 week -Recommended to hold Lasix for 3 days and start taking from 08/05/2021  Septic shock -Resolved, unclear etiology -Patient was started on vancomycin and cefepime -Antibiotics have been discontinued -Blood cultures remain negative, chest x-ray negative  Acute metabolic encephalopathy -Secondary to hypercarbia, hypoxemia -CT head showed 8 mm meningioma -EEG showed no epileptiform discharges -Metabolic encephalopathy has resolved; back to baseline -We will discontinue BuSpar, mirtazapine   Acute kidney injury -Creatinine was 1.23 on admission;  -Resolved, likely from Lasix -Today creatinine is 1.01, dose of Lasix has been changed as above   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 daily   Hypokalemia -Replete    Hypertension -Blood pressure is elevated -Continue metoprolol 50 mg daily.  Losartan 50 mg daily    Acute on chronic anemia with history of GI bleed -EGD  on 02/17/2021 showed nonbleeding ulcer, no evidence of active GI bleed during this admission -Transfused 1 unit PRBC -GI was consulted; she has h/o esophagitis and gastric ulcers -she was started on PPI twice a day,  -Daughter did not want EGD or colonoscopy at this time as per GI note -We will change Protonix to  40 mg p.o. daily   History of MRSA right elbow -Resulted in removal of hardware -MRSA screen negative   Patient to go home with home health PT    Procedures: Echocardiogram  Consultations: PCCM  Discharge Exam: Vitals:   08/01/21 1902 08/02/21 0800  BP:  (!) 130/54  Pulse:  70  Resp:    Temp:    SpO2: 100% 98%    General: Appears in no acute distress Cardiovascular: S1-S2, regular, no murmur auscultated Respiratory: Clear to auscultation bilaterally  Discharge Instructions   Discharge Instructions     Diet - low sodium heart healthy   Complete by: As directed    Discharge instructions   Complete by: As directed    Hold Lasix for 3 days, start taking from 08/05/2021.  Have blood work check,  BMP in 1 week at PCP office   Discharge wound care:   Complete by: As directed    Clean the right shin wound with NS, pat dry. Apply Xeroform gauze over the open wound and secure with foam dressing. Carefully remove any foam dressings to prevent skin tears. If any new skin tears, may apply the Xeroform gauze over the tear before apply the foam dressing.   Increase activity slowly   Complete by: As directed       Allergies as of 08/02/2021       Reactions   Amlodipine Besylate Swelling   Edema   Codeine Nausea And Vomiting   Oxycodone-acetaminophen Nausea And Vomiting        Medication List     STOP taking these medications    busPIRone 30 MG tablet Commonly known as: BUSPAR   mirtazapine 15 MG tablet Commonly known as: REMERON   nystatin 100000 UNIT/ML suspension Commonly known as: MYCOSTATIN   potassium chloride 10 MEQ tablet Commonly known as: KLOR-CON       TAKE these medications    ALPRAZolam 0.5 MG tablet Commonly known as: XANAX Take 0.5 mg by mouth 2 (two) times daily as needed for anxiety.   atorvastatin 20 MG tablet Commonly known as: LIPITOR Take 20 mg by mouth at bedtime.   buPROPion 300 MG 24 hr tablet Commonly known as:  WELLBUTRIN XL Take 300 mg by mouth every morning.   donepezil 10 MG tablet Commonly known as: ARICEPT Take 10 mg by mouth at bedtime.   Fluticasone-Umeclidin-Vilant 100-62.5-25 MCG/INH Aepb Inhale 1 puff into the lungs daily. Trelegy   furosemide 20 MG tablet Commonly known as: LASIX Take 1 tablet (20 mg total) by mouth daily. Start taking on: August 05, 2021 What changed:  These instructions start on August 05, 2021. If you are unsure what to do until then, ask your doctor or other care provider. Another medication with the same name was removed. Continue taking this medication, and follow the directions you see here.   ipratropium-albuterol 0.5-2.5 (3) MG/3ML Soln Commonly known as: DUONEB Inhale 3 mLs into the lungs every 4 (four) hours as needed (respiratory issues.).   levothyroxine 137 MCG tablet Commonly known as: SYNTHROID Take 137 mcg by mouth daily before breakfast.   losartan 50 MG tablet Commonly known as: COZAAR Take 1  tablet (50 mg total) by mouth daily. Start taking on: August 03, 2021   metoprolol succinate 50 MG 24 hr tablet Commonly known as: TOPROL-XL Take 50 mg by mouth daily. Take with or immediately following a meal.   pantoprazole 40 MG tablet Commonly known as: PROTONIX Take 1 tablet (40 mg total) by mouth daily. What changed: when to take this   polyethylene glycol 17 g packet Commonly known as: MIRALAX / GLYCOLAX Take 17 g by mouth daily as needed for moderate constipation.   predniSONE 20 MG tablet Commonly known as: DELTASONE Take 20 mg by mouth daily.   Ventolin HFA 108 (90 Base) MCG/ACT inhaler Generic drug: albuterol INHALE 2 PUFFS INTO THE LUNGS EVERY 4 HOURS AS NEEDED FOR WHEEZING ORSHORTNESS OF BREATH What changed: See the new instructions.               Discharge Care Instructions  (From admission, onward)           Start     Ordered   08/02/21 0000  Discharge wound care:       Comments: Clean the right shin  wound with NS, pat dry. Apply Xeroform gauze over the open wound and secure with foam dressing. Carefully remove any foam dressings to prevent skin tears. If any new skin tears, may apply the Xeroform gauze over the tear before apply the foam dressing.   08/02/21 1448           Allergies  Allergen Reactions   Amlodipine Besylate Swelling    Edema   Codeine Nausea And Vomiting   Oxycodone-Acetaminophen Nausea And Vomiting    Follow-up Information     [facility] Follow up.   Specialty: Home Health Services Why: Your home health has been set up with [facility]. The office will call you with start of care info. Idf you have any questions please call number listed above. Contact information: [place] [address] [phone]         Harlan Stains, MD Follow up in 1 week(s).   Specialty: Family Medicine Why: Check BMP as outpatient in 1 week Contact information: [place], [place] [address] [phone]                  The results of significant diagnostics from this hospitalization (including imaging, microbiology, ancillary and laboratory) are listed below for reference.    Significant Diagnostic Studies: CT HEAD WO CONTRAST (5MM)  Result Date: 07/24/2021 CLINICAL DATA:  Mental status change, persistent or worsening. EXAM: CT HEAD WITHOUT CONTRAST TECHNIQUE: Contiguous axial images were obtained from the base of the skull through the vertex without intravenous contrast. COMPARISON:  Head CT 07/23/2021.  Brain MRI 10/05/2020. FINDINGS: Brain: Mild generalized cerebral and cerebellar atrophy. Redemonstrated 8 mm rounded dural-based calcified focus overlying the right frontal lobe, suspicious for an incidental meningioma (series 5, image 23). Advanced patchy and confluent hypoattenuation within the cerebral white matter, nonspecific but compatible chronic small vessel ischemic disease. Gray-white differentiation  is preserved. No evidence of acute cortical infarct. There is no acute intracranial hemorrhage. No extra-axial fluid collection. No midline shift. Vascular: No hyperdense vessel or unexpected calcification. Skull: No calvarial fracture or focal suspicious osseous lesion. Sinuses/Orbits: Visualized orbits show no acute finding. Trace mucosal thickening within the bilateral ethmoid and sphenoid sinuses. Other: Minimal fluid within the bilateral mastoid air cells. IMPRESSION: No evidence of acute intracranial hemorrhage or acute infarction. No CT evidence of  acute hypoxic/ischemic injury. Redemonstrated 8 mm rounded dural-based calcified focus overlying the right frontal lobe, suspicious for an incidental meningioma. Severe chronic small vessel ischemic changes within the cerebral white matter. Mild generalized parenchymal atrophy. Minimal paranasal sinus mucosal thickening, as described. Trace fluid within the bilateral mastoid air cells. Electronically Signed   By: Kellie Simmering D.O.   On: 07/24/2021 13:11   CT HEAD WO CONTRAST (5MM)  Result Date: 07/23/2021 CLINICAL DATA:  Mental status change EXAM: CT HEAD WITHOUT CONTRAST TECHNIQUE: Contiguous axial images were obtained from the base of the skull through the vertex without intravenous contrast. COMPARISON:  MRI 10/13/2020, CT brain 09/29/2020 FINDINGS: Brain: No acute territorial infarction, hemorrhage or intracranial mass. Mild atrophy. Extensive white matter hypodensity consistent with chronic small vessel ischemic change. Stable ventricle size Vascular: No hyperdense vessels.  Carotid vascular calcification Skull: Normal. Negative for fracture or focal lesion. Sinuses/Orbits: Patchy mucosal thickening in the sinuses Other: None IMPRESSION: 1. No CT evidence for acute intracranial abnormality. 2. Mild atrophy. Extensive chronic small vessel ischemic changes of the white matter Electronically Signed   By: Donavan Foil M.D.   On: 07/23/2021 21:14   DG CHEST  PORT 1 VIEW  Result Date: 07/24/2021 CLINICAL DATA:  75 year old female status post central line placement. EXAM: PORTABLE CHEST 1 VIEW COMPARISON:  Chest x-ray 07/23/2021. FINDINGS: An endotracheal tube is in place with tip 4.2 cm above the carina. There is a left-sided internal jugular central venous catheter with tip terminating in the distal superior vena cava. A nasogastric tube is seen extending into the stomach, however, the tip of the nasogastric tube extends below the lower margin of the image. Lung volumes are normal. No consolidative airspace disease. No pleural effusions. No pneumothorax. Persistent mass projecting over the lower right lung, likely in the lateral segment of the right middle lobe based on comparison with prior chest CT 02/22/2021, compatible with progressive neoplasm. No other definite suspicious appearing pulmonary nodules or masses are noted. No evidence of pulmonary edema. Heart size is normal. Upper mediastinal contours are within normal limits. IMPRESSION: 1. Support apparatus, as above. 2. Persistent right middle lobe mass highly concerning for neoplasm, either a primary bronchogenic carcinoma or solitary metastasis. Once again, further evaluation with chest CT is strongly recommended in the near future. Electronically Signed   By: Vinnie Langton M.D.   On: 07/24/2021 05:45   DG Chest Portable 1 View  Result Date: 07/23/2021 CLINICAL DATA:  Altered mental status.  Intubation. EXAM: PORTABLE CHEST 1 VIEW COMPARISON:  Chest x-ray 03/22/2020, CT angio chest 02/22/2021 FINDINGS: Interval placement endotracheal tube with tip approximately 3 cm above the carina. Interval placement of an enteric tube coursing below the hemidiaphragm with tip and side port collimated off view. The heart and mediastinal contours are unchanged. Atherosclerotic plaque. Hilar vasculature prominence. Slight hyperinflation of the lungs. Interval increase in size of a right lung 2.5 cm density. No  pulmonary edema. No pleural effusion. No pneumothorax. No acute osseous abnormality. Vascular clips overlie the neck. IMPRESSION: 1. Endotracheal tube with tip approximately 3 cm above the carina. 2. Enteric tube coursing below the hemidiaphragm with tip and side port collimated off view. 3. Interval increase in size of a right lung 2.5 cm density. Recommend CT chest for further evaluation. Electronically Signed   By: Iven Finn M.D.   On: 07/23/2021 21:00   EEG adult  Result Date: 07/25/2021 Lora Havens, MD     07/25/2021  1:40 PM Patient Name: Holly [name]  VALE PERAZA MRN: [id number] Epilepsy Attending: Lora Havens Referring Physician/Provider: Dr Margaretha Seeds Date: 07/25/2021 Duration: 24.46 mins Patient history: 75yo F with ams. EEG to evaluate for seizure Level of alertness:  lethargic AEDs during EEG study: propofol Technical aspects: This EEG study was done with scalp electrodes positioned according to the 10-20 International system of electrode placement. Electrical activity was acquired at a sampling rate of 500Hz  and reviewed with a high frequency filter of 70Hz  and a low frequency filter of 1Hz . EEG data were recorded continuously and digitally stored. Description:  EEG showed continuous generalized polymorphic sharply contoured 3 to 6 Hz theta-delta slowing. Generalized periodic discharges with triphasic morphology at 0.5-1Hz  were also noted.  Hyperventilation and photic stimulation were not performed.    ABNORMALITY - Periodic discharges with triphasic morphology, generalized ( GPDs) - Continuous slow, generalized  IMPRESSION: This study showed generalized periodic discharges with triphasic morphology at 0.5-1 Hz which is on the ictal-interictal continuum with low potential for seizures. This eeg pattern can also been seen due toxic-metabolic causes. Additionally there is moderate to severe diffuse encephalopathy, nonspecific etiology. No seizures were seen throughout the recording. Priyanka O  Yadav   VAS [place] ABI WITH/WO TBI  Result Date: 07/26/2021  LOWER EXTREMITY DOPPLER STUDY Patient Name:  AAREN KROG  Date of Exam:   07/24/2021 Medical Rec #: [id number]      Accession #:    [id number] Date of Birth: [birth year]      Patient Gender: F Patient Age:   34 years Exam Location:  [facility] Procedure:      VAS [place] ABI WITH/WO TBI Referring Phys: Ileene Hutchinson RICHARDS --------------------------------------------------------------------------------  Indications: Absent pedal pulses. High Risk Factors: Hypertension, hyperlipidemia. Other Factors: COPD on home O2, lung cancer.  Limitations: Today's exam was limited due to involuntary patient movement and              Ventilation, lines. Comparison Study: No prior study Performing Technologist: Sharion Dove RVS  Examination Guidelines: A complete evaluation includes at minimum, Doppler waveform signals and systolic blood pressure reading at the level of bilateral brachial, anterior tibial, and posterior tibial arteries, when vessel segments are accessible. Bilateral testing is considered an integral part of a complete examination. Photoelectric Plethysmograph (PPG) waveforms and toe systolic pressure readings are included as required and additional duplex testing as needed. Limited examinations for reoccurring indications may be performed as noted.  ABI Findings: +---------+------------------+-----+-----------+--------+ Right    Rt Pressure (mmHg)IndexWaveform   Comment  +---------+------------------+-----+-----------+--------+ Brachial 123                    multiphasic         +---------+------------------+-----+-----------+--------+ PTA      111               0.90 biphasic            +---------+------------------+-----+-----------+--------+ DP       77                0.63 biphasic            +---------+------------------+-----+-----------+--------+ Great Toe50                0.41                      +---------+------------------+-----+-----------+--------+ +---------+------------------+-----+-----------+-----------------+ Left     Lt Pressure (mmHg)IndexWaveform   Comment           +---------+------------------+-----+-----------+-----------------+ Brachial  multiphasicRestricted/line   +---------+------------------+-----+-----------+-----------------+ PTA      88                0.72 biphasic                     +---------+------------------+-----+-----------+-----------------+ DP       65                0.53 biphasic                     +---------+------------------+-----+-----------+-----------------+ Great Toe                                  constant movement +---------+------------------+-----+-----------+-----------------+ +-------+-----------+----------------+------------+------------+ ABI/TBIToday's ABIToday's TBI     Previous ABIPrevious TBI +-------+-----------+----------------+------------+------------+ Right  0.90       0.41                                     +-------+-----------+----------------+------------+------------+ Left   0.72       unable to attain                         +-------+-----------+----------------+------------+------------+  Summary: Right: Resting right ankle-brachial index indicates mild right lower extremity arterial disease. The right toe-brachial index is abnormal. Left: Resting left ankle-brachial index indicates moderate left lower extremity arterial disease.  *See table(s) above for measurements and observations.  Electronically signed by Orlie Pollen on 07/26/2021 at 6:54:33 AM.    Final    ECHOCARDIOGRAM COMPLETE  Result Date: 07/25/2021    ECHOCARDIOGRAM REPORT   Patient Name:   Joanette N Kosinski Date of Exam: 07/25/2021 Medical Rec #:  [id number]     Height:       64.0 in Accession #:    [id number]    Weight:       152.1 lb Date of Birth:  07-09-1946     BSA:          1.742 m Patient Age:    55  years      BP:           118/45 mmHg Patient Gender: F             HR:           72 bpm. Exam Location:  Inpatient Procedure: 2D Echo, 3D Echo, Cardiac Doppler and Color Doppler Indications:    [facility]) [[id number]]  History:        Patient has prior history of Echocardiogram examinations, most                 recent 02/23/2021. COPD, Signs/Symptoms:Dyspnea; Risk                 Factors:Hypertension and Dyslipidemia. Thyroid disease.  Sonographer:    Darlina Sicilian RDCS Referring Phys: [id number] [facility] Pickle  Sonographer Comments: Global longitudinal strain was attempted. IMPRESSIONS  1. Left ventricular ejection fraction, by estimation, is 65 to 70%. The left ventricle has normal function. The left ventricle has no regional wall motion abnormalities. Left ventricular diastolic parameters are consistent with Grade I diastolic dysfunction (impaired relaxation).  2. Right ventricular systolic function is normal. The right ventricular size is normal. Tricuspid regurgitation signal is inadequate for assessing PA pressure.  3. The mitral valve is normal in structure. Trivial mitral  valve regurgitation. No evidence of mitral stenosis.  4. The aortic valve is tricuspid. Aortic valve regurgitation is not visualized. No aortic stenosis is present. FINDINGS  Left Ventricle: Left ventricular ejection fraction, by estimation, is 65 to 70%. The left ventricle has normal function. The left ventricle has no regional wall motion abnormalities. The left ventricular internal cavity size was normal in size. There is  no left ventricular hypertrophy. Left ventricular diastolic parameters are consistent with Grade I diastolic dysfunction (impaired relaxation). Right Ventricle: The right ventricular size is normal. Right vetricular wall thickness was not well visualized. Right ventricular systolic function is normal. Tricuspid regurgitation signal is inadequate for assessing PA pressure. Left Atrium: Left atrial size was normal in size.  Right Atrium: Right atrial size was normal in size. Pericardium: There is no evidence of pericardial effusion. Mitral Valve: The mitral valve is normal in structure. Trivial mitral valve regurgitation. No evidence of mitral valve stenosis. Tricuspid Valve: The tricuspid valve is normal in structure. Tricuspid valve regurgitation is trivial. Aortic Valve: The aortic valve is tricuspid. Aortic valve regurgitation is not visualized. No aortic stenosis is present. Pulmonic Valve: The pulmonic valve was not well visualized. Pulmonic valve regurgitation is not visualized. Aorta: The aortic root and ascending aorta are structurally normal, with no evidence of dilitation. IAS/Shunts: The interatrial septum was not well visualized.  LEFT VENTRICLE PLAX 2D LVIDd:         5.00 cm   Diastology LVIDs:         3.60 cm   LV e' medial:    6.20 cm/s LV PW:         1.00 cm   LV E/e' medial:  15.0 LV IVS:        0.70 cm   LV e' lateral:   7.40 cm/s LVOT diam:     1.90 cm   LV E/e' lateral: 12.6 LV SV:         77 LV SV Index:   44 LVOT Area:     2.84 cm                           3D Volume EF:                          3D EF:        69 %                          LV EDV:       114 ml                          LV ESV:       35 ml                          LV SV:        79 ml RIGHT VENTRICLE RV S prime:     15.20 cm/s TAPSE (M-mode): 1.8 cm LEFT ATRIUM             Index        RIGHT ATRIUM           Index LA diam:        3.20 cm 1.84 cm/m   RA Area:     10.70 cm LA Vol (A2C):   31.8 ml  18.26 ml/m  RA Volume:   22.20 ml  12.75 ml/m LA Vol (A4C):   23.8 ml 13.67 ml/m LA Biplane Vol: 30.4 ml 17.46 ml/m  AORTIC VALVE LVOT Vmax:   147.00 cm/s LVOT Vmean:  92.300 cm/s LVOT VTI:    0.271 m  AORTA Ao Root diam: 2.60 cm Ao Asc diam:  3.00 cm MITRAL VALVE MV Area (PHT): 3.11 cm     SHUNTS MV Decel Time: 244 msec     Systemic VTI:  0.27 m MV E velocity: 92.90 cm/s   Systemic Diam: 1.90 cm MV A velocity: 110.00 cm/s MV E/A ratio:  0.84  Oswaldo Milian MD Electronically signed by Oswaldo Milian MD Signature Date/Time: 07/25/2021/1:42:42 PM    Final    CT CHEST ABDOMEN PELVIS WO CONTRAST  Result Date: 07/24/2021 CLINICAL DATA:  Sepsis, found unresponsive at home EXAM: CT CHEST, ABDOMEN AND PELVIS WITHOUT CONTRAST TECHNIQUE: Multidetector CT imaging of the chest, abdomen and pelvis was performed following the standard protocol without IV contrast. Sagittal and coronal MPR images reconstructed from axial data set. No oral contrast administered. COMPARISON:  CT angio chest 02/22/2021, CT abdomen and pelvis 08/12/2014 FINDINGS: CT CHEST FINDINGS Cardiovascular: Atherosclerotic calcifications aorta, proximal great vessels and coronary arteries. Aorta normal caliber. Heart size normal. No pericardial effusion. Mediastinum/Nodes: Nasogastric tube traverses esophagus into stomach. Tip of endotracheal tube just above carina. Prior thyroidectomy. No thoracic adenopathy. Lungs/Pleura: Emphysematous changes and central peribronchial thickening consistent with COPD. RIGHT middle lobe mass 2.0 x 1.7 x 2.4 cm increased since 10 mm diameter on 02/22/2021 minimal atelectasis RIGHT lower lobe. Scarring in LEFT upper lobe stable. No pulmonary infiltrate pleural effusion, or pneumothorax. Musculoskeletal: Osseous demineralization. No focal osseous lesions. CT ABDOMEN PELVIS FINDINGS Hepatobiliary: Gallbladder and liver normal appearance Pancreas: Normal appearance Spleen: Normal appearance Adrenals/Urinary Tract: LEFT adrenal mass 15 x 12 mm unchanged low-attenuation consistent with adrenal adenoma. Adrenal glands, kidneys, and ureters otherwise normal appearance. Bladder decompressed by Foley catheter. Stomach/Bowel: Diverticulosis of descending and sigmoid colon. Minimal wall thickening sigmoid colon. Questionable subtle pericolic inflammatory changes. Cannot exclude subtle diverticulitis. Stomach decompressed by NG tube. Remaining bowel loops  unremarkable. Normal appendix. Vascular/Lymphatic: Atherosclerotic calcifications aorta and iliac arteries without aneurysm. No adenopathy Reproductive: Uterus surgically absent. Questionable visualization of an atrophic LEFT ovary. Other: No free air or free fluid.  No hernia. Musculoskeletal: Osseous demineralization. IMPRESSION: Increased size of a RIGHT middle lobe mass 2.0 x 1.7 x 2.4 cm, previously 10 mm diameter on 02/22/2021, suspicious for a pulmonary neoplasm. COPD changes with minimal RIGHT lower lobe atelectasis. LEFT adrenal adenoma. Distal colonic diverticulosis with questionable subtle wall thickening and pericolic inflammatory changes at the sigmoid colon, cannot exclude subtle diverticulitis. No other acute intrathoracic, intra-abdominal or intrapelvic abnormalities. Aortic Atherosclerosis (ICD10-I70.0). Electronically Signed   By: Lavonia Dana M.D.   On: 07/24/2021 13:21    Microbiology: Recent Results (from the past 240 hour(s))  Culture, blood (Routine x 2)     Status: None   Collection Time: 07/23/21  8:27 PM   Specimen: BLOOD RIGHT ARM  Result Value Ref Range Status   Specimen Description BLOOD RIGHT ARM  Final   Special Requests   Final    BOTTLES DRAWN AEROBIC AND ANAEROBIC Blood Culture adequate volume   Culture   Final    NO GROWTH 5 DAYS Performed at [facility], [address], [place], [place] [address]    Report Status 07/28/2021 FINAL  Final  Resp Panel by RT-PCR (Flu  A&B, Covid) Nasopharyngeal Swab     Status: None   Collection Time: 07/23/21  8:33 PM   Specimen: Nasopharyngeal Swab; Nasopharyngeal(NP) swabs in vial transport medium  Result Value Ref Range Status   SARS Coronavirus 2 by RT PCR NEGATIVE NEGATIVE Final    Comment: (NOTE) SARS-CoV-2 target nucleic acids are NOT DETECTED.  The SARS-CoV-2 RNA is generally detectable in upper respiratory specimens during the acute phase of infection. The lowest concentration of SARS-CoV-2 viral copies this  assay can detect is 138 copies/mL. A negative result does not preclude SARS-Cov-2 infection and should not be used as the sole basis for treatment or other patient management decisions. A negative result may occur with  improper specimen collection/handling, submission of specimen other than nasopharyngeal swab, presence of viral mutation(s) within the areas targeted by this assay, and inadequate number of viral copies(<138 copies/mL). A negative result must be combined with clinical observations, patient history, and epidemiological information. The expected result is Negative.  Fact Sheet for Patients:  EntrepreneurPulse.com.au  Fact Sheet for Healthcare Providers:  IncredibleEmployment.be  This test is no t yet approved or cleared by the [place] FDA and  has been authorized for detection and/or diagnosis of SARS-CoV-2 by FDA under an Emergency Use Authorization (EUA). This EUA will remain  in effect (meaning this test can be used) for the duration of the COVID-19 declaration under Section 564(b)(1) of the Act, 21 U.S.C.section 360bbb-3(b)(1), unless the authorization is terminated  or revoked sooner.       Influenza A by PCR NEGATIVE NEGATIVE Final   Influenza B by PCR NEGATIVE NEGATIVE Final    Comment: (NOTE) The Xpert Xpress SARS-CoV-2/FLU/RSV plus assay is intended as an aid in the diagnosis of influenza from Nasopharyngeal swab specimens and should not be used as a sole basis for treatment. Nasal washings and aspirates are unacceptable for Xpert Xpress SARS-CoV-2/FLU/RSV testing.  Fact Sheet for Patients: EntrepreneurPulse.com.au  Fact Sheet for Healthcare Providers: IncredibleEmployment.be  This test is not yet approved or cleared by the [place] FDA and has been authorized for detection and/or diagnosis of SARS-CoV-2 by FDA under an Emergency Use Authorization (EUA). This EUA will  remain in effect (meaning this test can be used) for the duration of the COVID-19 declaration under Section 564(b)(1) of the Act, 21 U.S.C. section 360bbb-3(b)(1), unless the authorization is terminated or revoked.  Performed at [facility], [place] [address], [place], [place] [address]   MRSA Next Gen by PCR, Nasal     Status: None   Collection Time: 07/24/21  3:08 AM   Specimen: Nasal Mucosa; Nasal Swab  Result Value Ref Range Status   MRSA by PCR Next Gen NOT DETECTED NOT DETECTED Final    Comment: (NOTE) The GeneXpert MRSA Assay (FDA approved for NASAL specimens only), is one component of a comprehensive MRSA colonization surveillance program. It is not intended to diagnose MRSA infection nor to guide or monitor treatment for MRSA infections. Test performance is not FDA approved in patients less than 79 years old. Performed at [facility], [place] [address] [place], [place] [address]      Labs: Basic Metabolic Panel: Recent Labs  Lab 07/29/21 1207 07/30/21 0340 07/31/21 0335 08/01/21 0322 08/02/21 1252  NA 141 141 142 143 140  K 3.7 3.1* 3.4* 4.0 4.0  CL 97* 94* 97* 95* 91*  CO2 37* 40* 38* 42* 42*  GLUCOSE 89 140* 96 92 137*  BUN 14 15 10 10  17  CREATININE 0.78 0.74 0.60 0.79 1.01*  CALCIUM 9.5 9.3 9.6 10.1 10.5*   Liver Function Tests: No results for input(s): AST, ALT, ALKPHOS, BILITOT, PROT, ALBUMIN in the last 168 hours. No results for input(s): LIPASE, AMYLASE in the last 168 hours. No results for input(s): AMMONIA in the last 168 hours. CBC: Recent Labs  Lab 07/27/21 1125 07/27/21 1819 07/28/21 0221 07/28/21 0959 07/29/21 0256  WBC 7.2 7.8 8.0 8.0 6.9  HGB 9.6* 9.7* 10.3* 9.9* 9.5*  HCT 32.8* 34.3* 35.4* 33.9* 32.4*  MCV 97.9 98.6 96.7 95.2 95.9  PLT 123* 137* 139* 132* 135*   Cardiac Enzymes: No results for input(s): CKTOTAL, CKMB, CKMBINDEX, TROPONINI in the last 168 hours. BNP: BNP (last 3 results) Recent Labs    02/23/21 0502  03/22/21 2325 07/23/21 2202  BNP 53.4 48.0 147.2*    ProBNP (last 3 results) No results for input(s): PROBNP in the last 8760 hours.  CBG: Recent Labs  Lab 07/31/21 1925 08/01/21 1537 08/01/21 2036 08/02/21 0754 08/02/21 1152  GLUCAP 238* 163* 121* 126* 125*       Signed:  Oswald Hillock MD.  [facility] 08/02/2021, 3:04 PM

## 2021-08-04 DIAGNOSIS — D630 Anemia in neoplastic disease: Secondary | ICD-10-CM | POA: Diagnosis not present

## 2021-08-04 DIAGNOSIS — J9621 Acute and chronic respiratory failure with hypoxia: Secondary | ICD-10-CM | POA: Diagnosis not present

## 2021-08-04 DIAGNOSIS — Z85820 Personal history of malignant melanoma of skin: Secondary | ICD-10-CM | POA: Diagnosis not present

## 2021-08-04 DIAGNOSIS — M542 Cervicalgia: Secondary | ICD-10-CM | POA: Diagnosis not present

## 2021-08-04 DIAGNOSIS — R32 Unspecified urinary incontinence: Secondary | ICD-10-CM | POA: Diagnosis not present

## 2021-08-04 DIAGNOSIS — I739 Peripheral vascular disease, unspecified: Secondary | ICD-10-CM | POA: Diagnosis not present

## 2021-08-04 DIAGNOSIS — R911 Solitary pulmonary nodule: Secondary | ICD-10-CM | POA: Diagnosis not present

## 2021-08-04 DIAGNOSIS — K259 Gastric ulcer, unspecified as acute or chronic, without hemorrhage or perforation: Secondary | ICD-10-CM | POA: Diagnosis not present

## 2021-08-04 DIAGNOSIS — E039 Hypothyroidism, unspecified: Secondary | ICD-10-CM | POA: Diagnosis not present

## 2021-08-04 DIAGNOSIS — J9622 Acute and chronic respiratory failure with hypercapnia: Secondary | ICD-10-CM | POA: Diagnosis not present

## 2021-08-04 DIAGNOSIS — G4733 Obstructive sleep apnea (adult) (pediatric): Secondary | ICD-10-CM | POA: Diagnosis not present

## 2021-08-04 DIAGNOSIS — Z7952 Long term (current) use of systemic steroids: Secondary | ICD-10-CM | POA: Diagnosis not present

## 2021-08-04 DIAGNOSIS — E875 Hyperkalemia: Secondary | ICD-10-CM | POA: Diagnosis not present

## 2021-08-04 DIAGNOSIS — F419 Anxiety disorder, unspecified: Secondary | ICD-10-CM | POA: Diagnosis not present

## 2021-08-04 DIAGNOSIS — C3412 Malignant neoplasm of upper lobe, left bronchus or lung: Secondary | ICD-10-CM | POA: Diagnosis not present

## 2021-08-04 DIAGNOSIS — I110 Hypertensive heart disease with heart failure: Secondary | ICD-10-CM | POA: Diagnosis not present

## 2021-08-04 DIAGNOSIS — E782 Mixed hyperlipidemia: Secondary | ICD-10-CM | POA: Diagnosis not present

## 2021-08-04 DIAGNOSIS — Z9981 Dependence on supplemental oxygen: Secondary | ICD-10-CM | POA: Diagnosis not present

## 2021-08-04 DIAGNOSIS — Z7951 Long term (current) use of inhaled steroids: Secondary | ICD-10-CM | POA: Diagnosis not present

## 2021-08-04 DIAGNOSIS — Z85850 Personal history of malignant neoplasm of thyroid: Secondary | ICD-10-CM | POA: Diagnosis not present

## 2021-08-04 DIAGNOSIS — R350 Frequency of micturition: Secondary | ICD-10-CM | POA: Diagnosis not present

## 2021-08-04 DIAGNOSIS — I5033 Acute on chronic diastolic (congestive) heart failure: Secondary | ICD-10-CM | POA: Diagnosis not present

## 2021-08-04 DIAGNOSIS — J449 Chronic obstructive pulmonary disease, unspecified: Secondary | ICD-10-CM | POA: Diagnosis not present

## 2021-08-04 DIAGNOSIS — I081 Rheumatic disorders of both mitral and tricuspid valves: Secondary | ICD-10-CM | POA: Diagnosis not present

## 2021-08-06 ENCOUNTER — Encounter (HOSPITAL_COMMUNITY): Payer: Self-pay | Admitting: Gastroenterology

## 2021-08-06 DIAGNOSIS — J449 Chronic obstructive pulmonary disease, unspecified: Secondary | ICD-10-CM | POA: Diagnosis not present

## 2021-08-06 DIAGNOSIS — J9621 Acute and chronic respiratory failure with hypoxia: Secondary | ICD-10-CM | POA: Diagnosis not present

## 2021-08-06 DIAGNOSIS — C3412 Malignant neoplasm of upper lobe, left bronchus or lung: Secondary | ICD-10-CM | POA: Diagnosis not present

## 2021-08-06 DIAGNOSIS — J9622 Acute and chronic respiratory failure with hypercapnia: Secondary | ICD-10-CM | POA: Diagnosis not present

## 2021-08-06 DIAGNOSIS — D630 Anemia in neoplastic disease: Secondary | ICD-10-CM | POA: Diagnosis not present

## 2021-08-06 DIAGNOSIS — I110 Hypertensive heart disease with heart failure: Secondary | ICD-10-CM | POA: Diagnosis not present

## 2021-08-10 ENCOUNTER — Telehealth: Payer: Self-pay | Admitting: *Deleted

## 2021-08-10 DIAGNOSIS — E785 Hyperlipidemia, unspecified: Secondary | ICD-10-CM | POA: Diagnosis not present

## 2021-08-10 DIAGNOSIS — J9621 Acute and chronic respiratory failure with hypoxia: Secondary | ICD-10-CM | POA: Diagnosis not present

## 2021-08-10 DIAGNOSIS — J449 Chronic obstructive pulmonary disease, unspecified: Secondary | ICD-10-CM | POA: Diagnosis not present

## 2021-08-10 DIAGNOSIS — N183 Chronic kidney disease, stage 3 unspecified: Secondary | ICD-10-CM | POA: Diagnosis not present

## 2021-08-10 DIAGNOSIS — M1612 Unilateral primary osteoarthritis, left hip: Secondary | ICD-10-CM | POA: Diagnosis not present

## 2021-08-10 DIAGNOSIS — I110 Hypertensive heart disease with heart failure: Secondary | ICD-10-CM | POA: Diagnosis not present

## 2021-08-10 DIAGNOSIS — D630 Anemia in neoplastic disease: Secondary | ICD-10-CM | POA: Diagnosis not present

## 2021-08-10 DIAGNOSIS — C3412 Malignant neoplasm of upper lobe, left bronchus or lung: Secondary | ICD-10-CM | POA: Diagnosis not present

## 2021-08-10 DIAGNOSIS — F325 Major depressive disorder, single episode, in full remission: Secondary | ICD-10-CM | POA: Diagnosis not present

## 2021-08-10 DIAGNOSIS — G4700 Insomnia, unspecified: Secondary | ICD-10-CM | POA: Diagnosis not present

## 2021-08-10 DIAGNOSIS — I129 Hypertensive chronic kidney disease with stage 1 through stage 4 chronic kidney disease, or unspecified chronic kidney disease: Secondary | ICD-10-CM | POA: Diagnosis not present

## 2021-08-10 DIAGNOSIS — E890 Postprocedural hypothyroidism: Secondary | ICD-10-CM | POA: Diagnosis not present

## 2021-08-10 DIAGNOSIS — I5033 Acute on chronic diastolic (congestive) heart failure: Secondary | ICD-10-CM | POA: Diagnosis not present

## 2021-08-10 DIAGNOSIS — I5032 Chronic diastolic (congestive) heart failure: Secondary | ICD-10-CM | POA: Diagnosis not present

## 2021-08-10 DIAGNOSIS — J9622 Acute and chronic respiratory failure with hypercapnia: Secondary | ICD-10-CM | POA: Diagnosis not present

## 2021-08-10 DIAGNOSIS — M1611 Unilateral primary osteoarthritis, right hip: Secondary | ICD-10-CM | POA: Diagnosis not present

## 2021-08-10 NOTE — Telephone Encounter (Signed)
RETURNED PATIENT'S DAUGHTER (MARJORIE MCDUFFIE) PHONE CALL, SPOKE WITH MS. DUFFIE

## 2021-08-11 ENCOUNTER — Other Ambulatory Visit: Payer: Self-pay | Admitting: Radiation Oncology

## 2021-08-11 DIAGNOSIS — N183 Chronic kidney disease, stage 3 unspecified: Secondary | ICD-10-CM | POA: Diagnosis not present

## 2021-08-11 DIAGNOSIS — E440 Moderate protein-calorie malnutrition: Secondary | ICD-10-CM | POA: Diagnosis not present

## 2021-08-11 DIAGNOSIS — C3412 Malignant neoplasm of upper lobe, left bronchus or lung: Secondary | ICD-10-CM

## 2021-08-11 DIAGNOSIS — Z23 Encounter for immunization: Secondary | ICD-10-CM | POA: Diagnosis not present

## 2021-08-11 DIAGNOSIS — R918 Other nonspecific abnormal finding of lung field: Secondary | ICD-10-CM | POA: Diagnosis not present

## 2021-08-11 DIAGNOSIS — I5023 Acute on chronic systolic (congestive) heart failure: Secondary | ICD-10-CM | POA: Diagnosis not present

## 2021-08-11 DIAGNOSIS — J9620 Acute and chronic respiratory failure, unspecified whether with hypoxia or hypercapnia: Secondary | ICD-10-CM | POA: Diagnosis not present

## 2021-08-11 DIAGNOSIS — J449 Chronic obstructive pulmonary disease, unspecified: Secondary | ICD-10-CM | POA: Diagnosis not present

## 2021-08-11 DIAGNOSIS — R7301 Impaired fasting glucose: Secondary | ICD-10-CM | POA: Diagnosis not present

## 2021-08-11 NOTE — Progress Notes (Signed)
I called the patient to let her know that Dr. Lisbeth Renshaw had reviewed her recent CT scan she had during an inpatient hospitalization after she was found to be unresponsive.  She was intubated for several days, it was felt that she had a CHF exacerbation but the respiratory failure she suffered was felt to be from sepsis though an organism could not be identified.  Fortunately she has made a tremendous recovery and is now back to her baseline of 3 L of oxygen via nasal cannula.  She has gotten back to taking her medication as prescribed which she admits she had been lax on prior to her hospitalization.  She is trying to get back to doing some light exercise as well.  She saw Dr. Dema Severin for follow-up and had blood work drawn today which she is waiting for follow-up on.  She sees Dr. Alcide Clever and pulmonary medicine in the next few days as well.  I let her know that the CT scan that she had had during that hospitalization showed an increase in the right middle lobe nodule that we have been following that has remained stable for  2 years time, had previously been measuring 10 to 11 mm, the scan on 07/24/2021 was limited without contrast but showed this lesion measuring up to 2.4 cm.  No adenopathy was identified but again this was limited by the lack of IV contrast.  She had an elevation in her creatinine during that hospitalization but it the time of discharge it was at baseline of 1.0.  She was counseled on the rationale for hospice care and is concerned whether or not she truly needs this.  We discussed the rationale for pet imaging.  We also discussed the option of palliative care discussion for goals of care and advanced directives however at this point in time while our encounter is quite limited by telephone she seems to be just as active in our conversation and clinically doing so much better since her hospitalization.  I do not think that she is actively dying based on our encounter, but would consider palliative  medicine as an additional resource for her in the outpatient setting.  Once we have more information from her PET scan and neck steps to consider possible SBRT to the right middle lobe lesion, we can continue the conversation about palliative care referral.  She and her daughter who joined in on the conversation are in agreement.

## 2021-08-12 DIAGNOSIS — J9622 Acute and chronic respiratory failure with hypercapnia: Secondary | ICD-10-CM | POA: Diagnosis not present

## 2021-08-12 DIAGNOSIS — C3412 Malignant neoplasm of upper lobe, left bronchus or lung: Secondary | ICD-10-CM | POA: Diagnosis not present

## 2021-08-12 DIAGNOSIS — I110 Hypertensive heart disease with heart failure: Secondary | ICD-10-CM | POA: Diagnosis not present

## 2021-08-12 DIAGNOSIS — J9621 Acute and chronic respiratory failure with hypoxia: Secondary | ICD-10-CM | POA: Diagnosis not present

## 2021-08-12 DIAGNOSIS — D630 Anemia in neoplastic disease: Secondary | ICD-10-CM | POA: Diagnosis not present

## 2021-08-12 DIAGNOSIS — J449 Chronic obstructive pulmonary disease, unspecified: Secondary | ICD-10-CM | POA: Diagnosis not present

## 2021-08-17 ENCOUNTER — Ambulatory Visit (HOSPITAL_COMMUNITY): Admission: RE | Admit: 2021-08-17 | Payer: Medicare Other | Source: Home / Self Care | Admitting: Gastroenterology

## 2021-08-17 ENCOUNTER — Encounter (HOSPITAL_COMMUNITY): Admission: RE | Payer: Self-pay | Source: Home / Self Care

## 2021-08-17 DIAGNOSIS — G4733 Obstructive sleep apnea (adult) (pediatric): Secondary | ICD-10-CM | POA: Diagnosis not present

## 2021-08-17 DIAGNOSIS — R918 Other nonspecific abnormal finding of lung field: Secondary | ICD-10-CM | POA: Diagnosis not present

## 2021-08-17 DIAGNOSIS — D638 Anemia in other chronic diseases classified elsewhere: Secondary | ICD-10-CM | POA: Diagnosis not present

## 2021-08-17 DIAGNOSIS — J449 Chronic obstructive pulmonary disease, unspecified: Secondary | ICD-10-CM | POA: Diagnosis not present

## 2021-08-17 DIAGNOSIS — R5383 Other fatigue: Secondary | ICD-10-CM | POA: Diagnosis not present

## 2021-08-17 DIAGNOSIS — J9611 Chronic respiratory failure with hypoxia: Secondary | ICD-10-CM | POA: Diagnosis not present

## 2021-08-17 DIAGNOSIS — F411 Generalized anxiety disorder: Secondary | ICD-10-CM | POA: Diagnosis not present

## 2021-08-17 DIAGNOSIS — Z8616 Personal history of COVID-19: Secondary | ICD-10-CM | POA: Diagnosis not present

## 2021-08-17 SURGERY — COLONOSCOPY WITH PROPOFOL
Anesthesia: Monitor Anesthesia Care

## 2021-08-19 DIAGNOSIS — C3412 Malignant neoplasm of upper lobe, left bronchus or lung: Secondary | ICD-10-CM | POA: Diagnosis not present

## 2021-08-19 DIAGNOSIS — D630 Anemia in neoplastic disease: Secondary | ICD-10-CM | POA: Diagnosis not present

## 2021-08-19 DIAGNOSIS — I110 Hypertensive heart disease with heart failure: Secondary | ICD-10-CM | POA: Diagnosis not present

## 2021-08-19 DIAGNOSIS — J9622 Acute and chronic respiratory failure with hypercapnia: Secondary | ICD-10-CM | POA: Diagnosis not present

## 2021-08-19 DIAGNOSIS — J9621 Acute and chronic respiratory failure with hypoxia: Secondary | ICD-10-CM | POA: Diagnosis not present

## 2021-08-19 DIAGNOSIS — J449 Chronic obstructive pulmonary disease, unspecified: Secondary | ICD-10-CM | POA: Diagnosis not present

## 2021-08-20 ENCOUNTER — Telehealth: Payer: Self-pay | Admitting: *Deleted

## 2021-08-20 ENCOUNTER — Other Ambulatory Visit: Payer: Self-pay | Admitting: Radiation Oncology

## 2021-08-20 DIAGNOSIS — R918 Other nonspecific abnormal finding of lung field: Secondary | ICD-10-CM

## 2021-08-20 NOTE — Telephone Encounter (Signed)
Called patient to inform of Pet Scan for 09-02-21- arrival time- 9:30 am @ [facility], patient to have water only - 6 hrs. prior to test, spoke with patient and she verified understanding this appt. and its restrictions

## 2021-08-23 DIAGNOSIS — D630 Anemia in neoplastic disease: Secondary | ICD-10-CM | POA: Diagnosis not present

## 2021-08-23 DIAGNOSIS — J9621 Acute and chronic respiratory failure with hypoxia: Secondary | ICD-10-CM | POA: Diagnosis not present

## 2021-08-23 DIAGNOSIS — I110 Hypertensive heart disease with heart failure: Secondary | ICD-10-CM | POA: Diagnosis not present

## 2021-08-23 DIAGNOSIS — J449 Chronic obstructive pulmonary disease, unspecified: Secondary | ICD-10-CM | POA: Diagnosis not present

## 2021-08-23 DIAGNOSIS — C3412 Malignant neoplasm of upper lobe, left bronchus or lung: Secondary | ICD-10-CM | POA: Diagnosis not present

## 2021-08-23 DIAGNOSIS — J9622 Acute and chronic respiratory failure with hypercapnia: Secondary | ICD-10-CM | POA: Diagnosis not present

## 2021-08-24 DIAGNOSIS — M25571 Pain in right ankle and joints of right foot: Secondary | ICD-10-CM | POA: Diagnosis not present

## 2021-08-24 DIAGNOSIS — M25562 Pain in left knee: Secondary | ICD-10-CM | POA: Diagnosis not present

## 2021-08-28 ENCOUNTER — Encounter (HOSPITAL_COMMUNITY): Payer: Self-pay | Admitting: Emergency Medicine

## 2021-08-28 ENCOUNTER — Emergency Department (HOSPITAL_COMMUNITY): Payer: Medicare Other

## 2021-08-28 ENCOUNTER — Other Ambulatory Visit: Payer: Self-pay

## 2021-08-28 ENCOUNTER — Inpatient Hospital Stay (HOSPITAL_COMMUNITY)
Admission: EM | Admit: 2021-08-28 | Discharge: 2021-08-31 | DRG: 871 | Disposition: A | Discharge status: Home Health | Payer: Medicare Other | Attending: Family Medicine | Admitting: Family Medicine

## 2021-08-28 DIAGNOSIS — Z923 Personal history of irradiation: Secondary | ICD-10-CM | POA: Diagnosis not present

## 2021-08-28 DIAGNOSIS — R7881 Bacteremia: Secondary | ICD-10-CM | POA: Diagnosis present

## 2021-08-28 DIAGNOSIS — B9620 Unspecified Escherichia coli [E. coli] as the cause of diseases classified elsewhere: Secondary | ICD-10-CM | POA: Diagnosis present

## 2021-08-28 DIAGNOSIS — Z801 Family history of malignant neoplasm of trachea, bronchus and lung: Secondary | ICD-10-CM

## 2021-08-28 DIAGNOSIS — N189 Chronic kidney disease, unspecified: Secondary | ICD-10-CM | POA: Diagnosis present

## 2021-08-28 DIAGNOSIS — J9622 Acute and chronic respiratory failure with hypercapnia: Secondary | ICD-10-CM | POA: Diagnosis present

## 2021-08-28 DIAGNOSIS — Z87891 Personal history of nicotine dependence: Secondary | ICD-10-CM

## 2021-08-28 DIAGNOSIS — R6510 Systemic inflammatory response syndrome (SIRS) of non-infectious origin without acute organ dysfunction: Secondary | ICD-10-CM

## 2021-08-28 DIAGNOSIS — G9341 Metabolic encephalopathy: Secondary | ICD-10-CM | POA: Diagnosis present

## 2021-08-28 DIAGNOSIS — C3412 Malignant neoplasm of upper lobe, left bronchus or lung: Secondary | ICD-10-CM | POA: Diagnosis not present

## 2021-08-28 DIAGNOSIS — E039 Hypothyroidism, unspecified: Secondary | ICD-10-CM | POA: Diagnosis present

## 2021-08-28 DIAGNOSIS — Z8249 Family history of ischemic heart disease and other diseases of the circulatory system: Secondary | ICD-10-CM

## 2021-08-28 DIAGNOSIS — R69 Illness, unspecified: Secondary | ICD-10-CM

## 2021-08-28 DIAGNOSIS — F411 Generalized anxiety disorder: Secondary | ICD-10-CM

## 2021-08-28 DIAGNOSIS — L89312 Pressure ulcer of right buttock, stage 2: Secondary | ICD-10-CM | POA: Diagnosis present

## 2021-08-28 DIAGNOSIS — E782 Mixed hyperlipidemia: Secondary | ICD-10-CM | POA: Diagnosis present

## 2021-08-28 DIAGNOSIS — Z85820 Personal history of malignant melanoma of skin: Secondary | ICD-10-CM

## 2021-08-28 DIAGNOSIS — I130 Hypertensive heart and chronic kidney disease with heart failure and stage 1 through stage 4 chronic kidney disease, or unspecified chronic kidney disease: Secondary | ICD-10-CM | POA: Diagnosis present

## 2021-08-28 DIAGNOSIS — I5032 Chronic diastolic (congestive) heart failure: Secondary | ICD-10-CM | POA: Diagnosis present

## 2021-08-28 DIAGNOSIS — R404 Transient alteration of awareness: Secondary | ICD-10-CM | POA: Diagnosis not present

## 2021-08-28 DIAGNOSIS — Z85118 Personal history of other malignant neoplasm of bronchus and lung: Secondary | ICD-10-CM | POA: Diagnosis not present

## 2021-08-28 DIAGNOSIS — C342 Malignant neoplasm of middle lobe, bronchus or lung: Secondary | ICD-10-CM | POA: Diagnosis present

## 2021-08-28 DIAGNOSIS — J441 Chronic obstructive pulmonary disease with (acute) exacerbation: Secondary | ICD-10-CM

## 2021-08-28 DIAGNOSIS — M109 Gout, unspecified: Secondary | ICD-10-CM | POA: Diagnosis present

## 2021-08-28 DIAGNOSIS — D631 Anemia in chronic kidney disease: Secondary | ICD-10-CM | POA: Diagnosis present

## 2021-08-28 DIAGNOSIS — J439 Emphysema, unspecified: Secondary | ICD-10-CM | POA: Diagnosis present

## 2021-08-28 DIAGNOSIS — R0902 Hypoxemia: Secondary | ICD-10-CM | POA: Diagnosis not present

## 2021-08-28 DIAGNOSIS — F418 Other specified anxiety disorders: Secondary | ICD-10-CM | POA: Diagnosis not present

## 2021-08-28 DIAGNOSIS — N179 Acute kidney failure, unspecified: Secondary | ICD-10-CM | POA: Diagnosis present

## 2021-08-28 DIAGNOSIS — D649 Anemia, unspecified: Secondary | ICD-10-CM | POA: Diagnosis not present

## 2021-08-28 DIAGNOSIS — G4700 Insomnia, unspecified: Secondary | ICD-10-CM | POA: Diagnosis present

## 2021-08-28 DIAGNOSIS — J9621 Acute and chronic respiratory failure with hypoxia: Secondary | ICD-10-CM

## 2021-08-28 DIAGNOSIS — Z66 Do not resuscitate: Secondary | ICD-10-CM | POA: Diagnosis present

## 2021-08-28 DIAGNOSIS — A4151 Sepsis due to Escherichia coli [E. coli]: Secondary | ICD-10-CM | POA: Diagnosis present

## 2021-08-28 DIAGNOSIS — Z9981 Dependence on supplemental oxygen: Secondary | ICD-10-CM

## 2021-08-28 DIAGNOSIS — R5383 Other fatigue: Secondary | ICD-10-CM | POA: Diagnosis not present

## 2021-08-28 DIAGNOSIS — R911 Solitary pulmonary nodule: Secondary | ICD-10-CM | POA: Diagnosis not present

## 2021-08-28 DIAGNOSIS — Z7189 Other specified counseling: Secondary | ICD-10-CM | POA: Diagnosis not present

## 2021-08-28 DIAGNOSIS — I959 Hypotension, unspecified: Secondary | ICD-10-CM | POA: Diagnosis not present

## 2021-08-28 DIAGNOSIS — A419 Sepsis, unspecified organism: Secondary | ICD-10-CM

## 2021-08-28 DIAGNOSIS — Z20822 Contact with and (suspected) exposure to covid-19: Secondary | ICD-10-CM | POA: Diagnosis present

## 2021-08-28 DIAGNOSIS — Z79899 Other long term (current) drug therapy: Secondary | ICD-10-CM | POA: Diagnosis not present

## 2021-08-28 DIAGNOSIS — R059 Cough, unspecified: Secondary | ICD-10-CM | POA: Diagnosis not present

## 2021-08-28 DIAGNOSIS — K219 Gastro-esophageal reflux disease without esophagitis: Secondary | ICD-10-CM | POA: Diagnosis present

## 2021-08-28 DIAGNOSIS — N390 Urinary tract infection, site not specified: Secondary | ICD-10-CM | POA: Diagnosis present

## 2021-08-28 DIAGNOSIS — Z7952 Long term (current) use of systemic steroids: Secondary | ICD-10-CM

## 2021-08-28 DIAGNOSIS — R0602 Shortness of breath: Secondary | ICD-10-CM | POA: Diagnosis not present

## 2021-08-28 DIAGNOSIS — Z7951 Long term (current) use of inhaled steroids: Secondary | ICD-10-CM

## 2021-08-28 DIAGNOSIS — Z515 Encounter for palliative care: Secondary | ICD-10-CM | POA: Diagnosis not present

## 2021-08-28 DIAGNOSIS — R410 Disorientation, unspecified: Secondary | ICD-10-CM | POA: Diagnosis not present

## 2021-08-28 LAB — CBC
HCT: 28.1 % — ABNORMAL LOW (ref 36.0–46.0)
Hemoglobin: 8 g/dL — ABNORMAL LOW (ref 12.0–15.0)
MCH: 28.3 pg (ref 26.0–34.0)
MCHC: 28.5 g/dL — ABNORMAL LOW (ref 30.0–36.0)
MCV: 99.3 fL (ref 80.0–100.0)
Platelets: 200 10*3/uL (ref 150–400)
RBC: 2.83 MIL/uL — ABNORMAL LOW (ref 3.87–5.11)
RDW: 16.2 % — ABNORMAL HIGH (ref 11.5–15.5)
WBC: 13.1 10*3/uL — ABNORMAL HIGH (ref 4.0–10.5)
nRBC: 0.2 % (ref 0.0–0.2)

## 2021-08-28 LAB — HEPATIC FUNCTION PANEL
ALT: 13 U/L (ref 0–44)
AST: 16 U/L (ref 15–41)
Albumin: 2.1 g/dL — ABNORMAL LOW (ref 3.5–5.0)
Alkaline Phosphatase: 130 U/L — ABNORMAL HIGH (ref 38–126)
Bilirubin, Direct: 0.2 mg/dL (ref 0.0–0.2)
Indirect Bilirubin: 0.6 mg/dL (ref 0.3–0.9)
Total Bilirubin: 0.8 mg/dL (ref 0.3–1.2)
Total Protein: 5.9 g/dL — ABNORMAL LOW (ref 6.5–8.1)

## 2021-08-28 LAB — I-STAT VENOUS BLOOD GAS, ED
Acid-Base Excess: 18 mmol/L — ABNORMAL HIGH (ref 0.0–2.0)
Bicarbonate: 45.8 mmol/L — ABNORMAL HIGH (ref 20.0–28.0)
Calcium, Ion: 1.24 mmol/L (ref 1.15–1.40)
HCT: 27 % — ABNORMAL LOW (ref 36.0–46.0)
Hemoglobin: 9.2 g/dL — ABNORMAL LOW (ref 12.0–15.0)
O2 Saturation: 77 %
Potassium: 3.8 mmol/L (ref 3.5–5.1)
Sodium: 138 mmol/L (ref 135–145)
TCO2: 48 mmol/L — ABNORMAL HIGH (ref 22–32)
pCO2, Ven: 81 mmHg (ref 44.0–60.0)
pH, Ven: 7.361 (ref 7.250–7.430)
pO2, Ven: 46 mmHg — ABNORMAL HIGH (ref 32.0–45.0)

## 2021-08-28 LAB — BASIC METABOLIC PANEL
Anion gap: 6 (ref 5–15)
BUN: 27 mg/dL — ABNORMAL HIGH (ref 8–23)
CO2: 39 mmol/L — ABNORMAL HIGH (ref 22–32)
Calcium: 9.3 mg/dL (ref 8.9–10.3)
Chloride: 92 mmol/L — ABNORMAL LOW (ref 98–111)
Creatinine, Ser: 1.51 mg/dL — ABNORMAL HIGH (ref 0.44–1.00)
GFR, Estimated: 36 mL/min — ABNORMAL LOW (ref >60)
Glucose, Bld: 109 mg/dL — ABNORMAL HIGH (ref 70–99)
Potassium: 3.8 mmol/L (ref 3.5–5.1)
Sodium: 137 mmol/L (ref 135–145)

## 2021-08-28 LAB — LACTIC ACID, PLASMA
Lactic Acid, Venous: 1 mmol/L (ref 0.5–1.9)
Lactic Acid, Venous: 1.3 mmol/L (ref 0.5–1.9)

## 2021-08-28 LAB — RESP PANEL BY RT-PCR (FLU A&B, COVID) ARPGX2
Influenza A by PCR: NEGATIVE
Influenza B by PCR: NEGATIVE
SARS Coronavirus 2 by RT PCR: NEGATIVE

## 2021-08-28 LAB — AMMONIA: Ammonia: <10 umol/L (ref 9–35)

## 2021-08-28 LAB — TROPONIN I (HIGH SENSITIVITY)
Troponin I (High Sensitivity): 37 ng/L — ABNORMAL HIGH (ref <18)
Troponin I (High Sensitivity): 22 ng/L — ABNORMAL HIGH (ref <18)

## 2021-08-28 LAB — PROCALCITONIN: Procalcitonin: 18.31 ng/mL

## 2021-08-28 LAB — TSH: TSH: 0.993 u[IU]/mL (ref 0.350–4.500)

## 2021-08-28 LAB — BRAIN NATRIURETIC PEPTIDE: B Natriuretic Peptide: 232.5 pg/mL — ABNORMAL HIGH (ref 0.0–100.0)

## 2021-08-28 IMAGING — DX DG CHEST 2V
2 series · 2 of 2 positions shown · non-contrast
Comparison: [DATE], chest radiograph and CT

CLINICAL DATA: SEELEY , a 75 y.o. female was evaluated in
triage. Pt complains of increased SOB since [REDACTED]. Endorses
slightly elevated temperature, cough, fatigue, confusion.
Desaturated to 83% on home 3L, improved with nonrebreath.*comment
was truncated*SOB

EXAM:
CHEST - 2 VIEW

[chest lat]
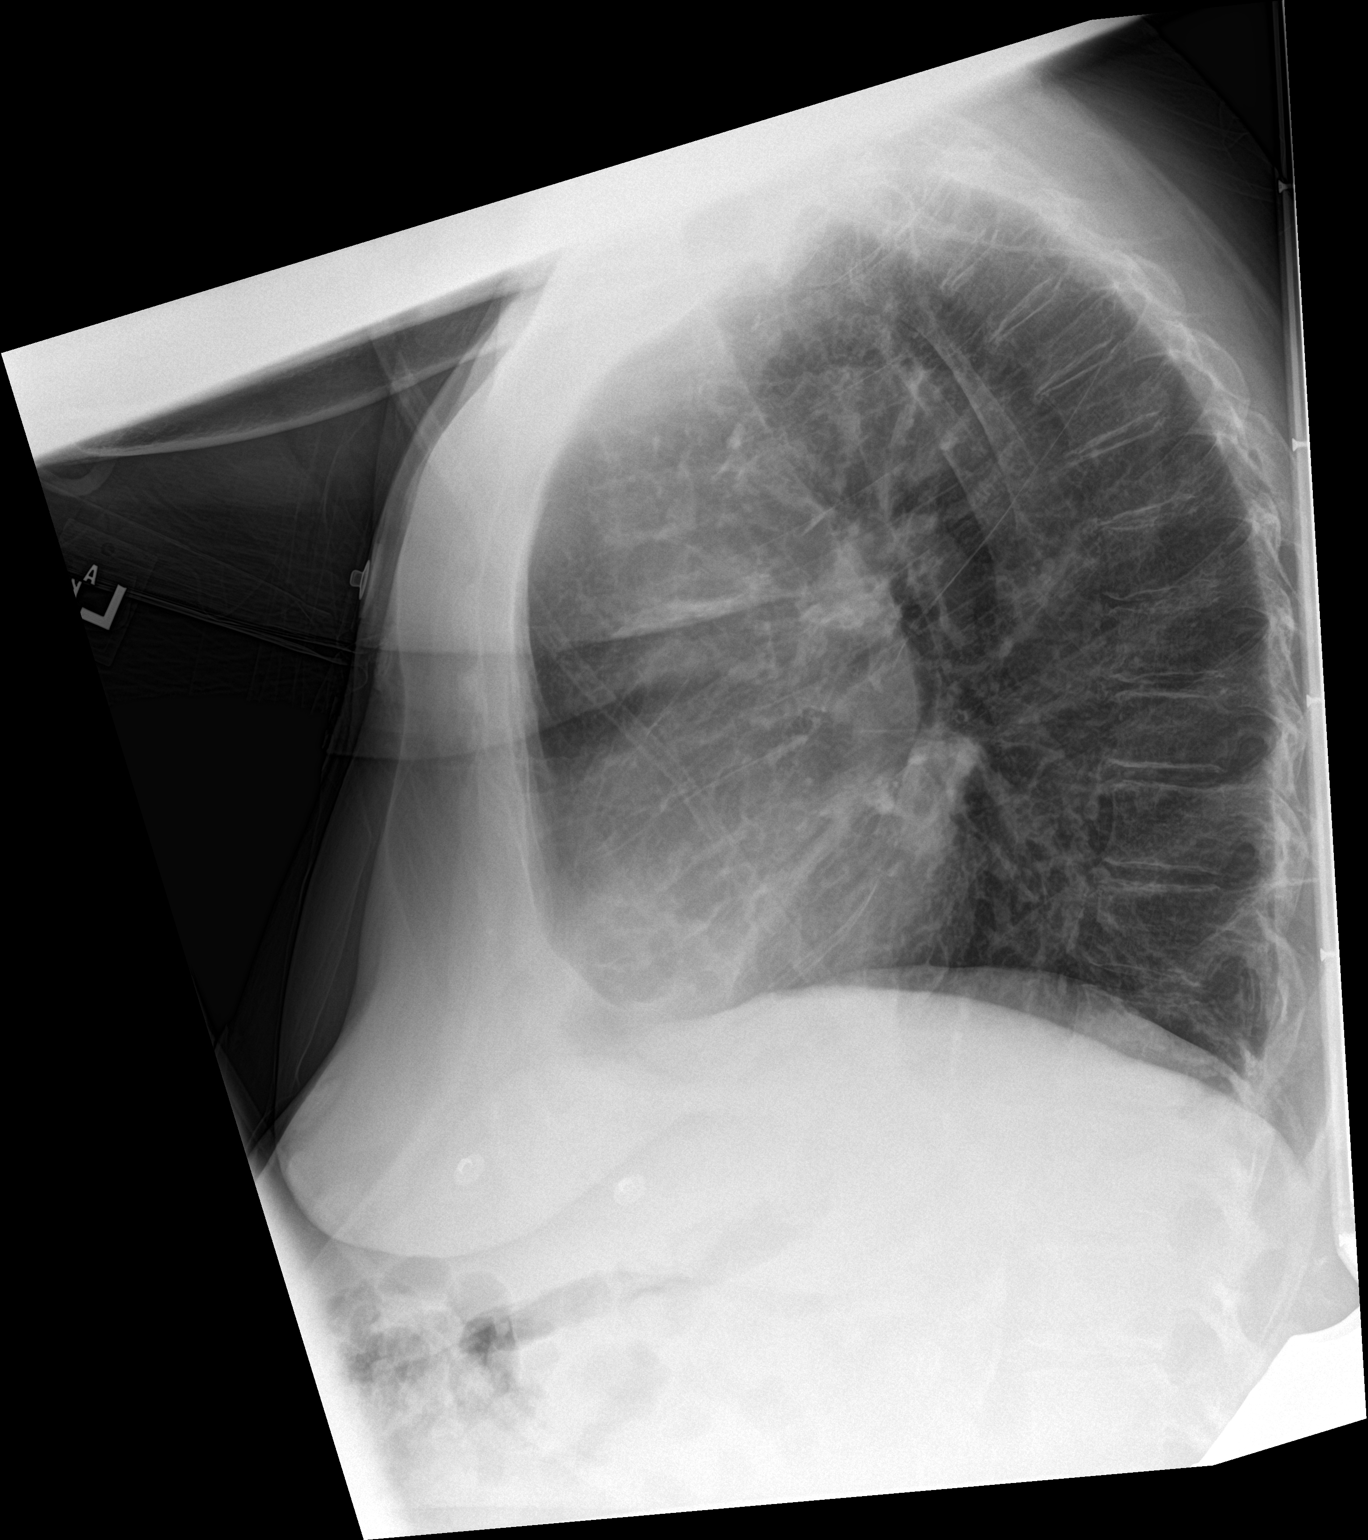

[chest ap]
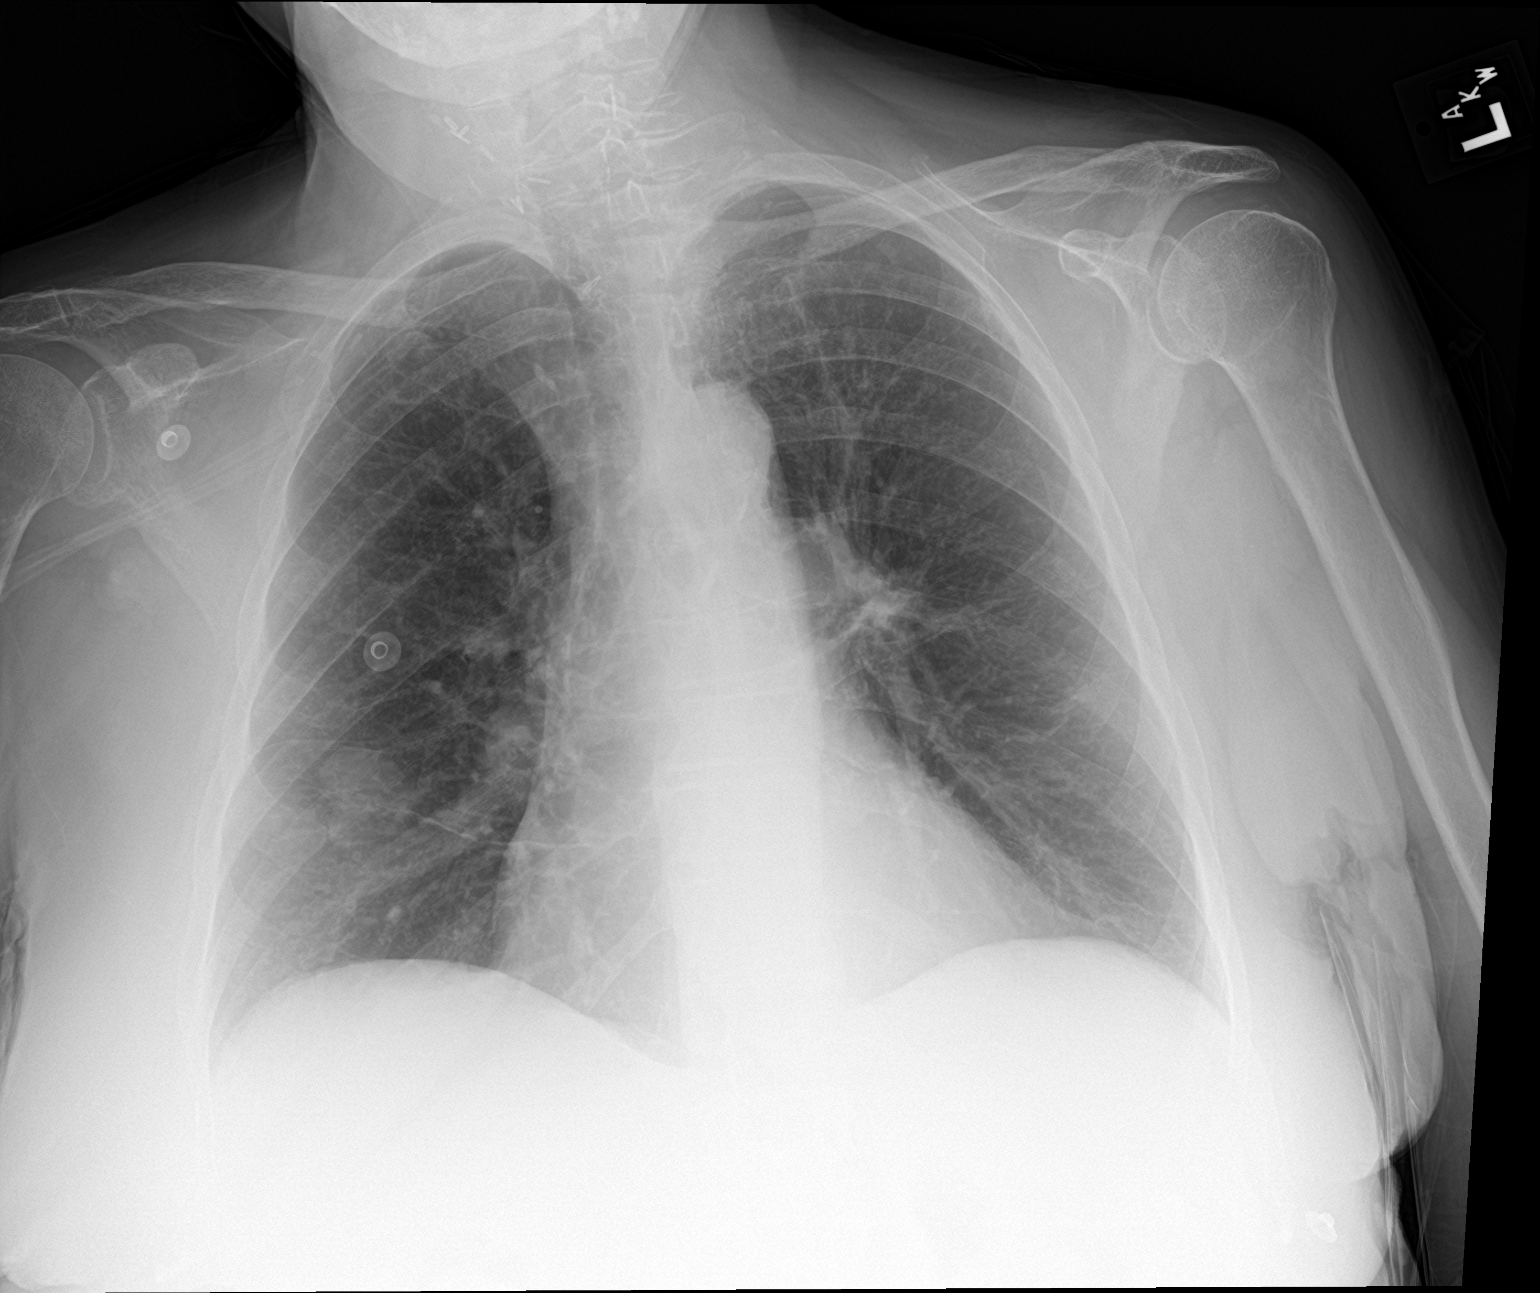

[2 of 2 positions shown; findings below may reference images not displayed]

FINDINGS: Normal cardiac silhouette. Again demonstrated 2 cm nodule in the
RIGHT middle lobe. No effusion, infiltrate pneumothorax. No acute
osseous abnormality.
IMPRESSION: 1.  No acute cardiopulmonary process.
2. Large RIGHT middle lobe nodule unchanged from recent CT. Nodule
described as suspicious for pulmonary neoplasm on CT [DATE].

## 2021-08-28 MED ORDER — FLUTICASONE-UMECLIDIN-VILANT 100-62.5-25 MCG/INH IN AEPB: 1.0000 | INHALATION_SPRAY | Freq: Every day | RESPIRATORY_TRACT | Status: DC

## 2021-08-28 MED ORDER — LACTATED RINGERS IV BOLUS
500.0000 mL | Freq: Once | INTRAVENOUS | Status: AC
  Administered 2021-08-28: 500 mL via INTRAVENOUS

## 2021-08-28 MED ORDER — GUAIFENESIN ER 600 MG PO TB12
600.0000 mg | ORAL_TABLET | Freq: Two times a day (BID) | ORAL | Status: DC
  Administered 2021-08-28 – 2021-08-31 (×7): 600 mg via ORAL
  Filled 2021-08-28 (×7): qty 1

## 2021-08-28 MED ORDER — MIRTAZAPINE 7.5 MG PO TABS
15.0000 mg | ORAL_TABLET | Freq: Every day | ORAL | Status: DC
  Administered 2021-08-29 – 2021-08-30 (×2): 15 mg via ORAL
  Filled 2021-08-28 (×2): qty 2

## 2021-08-28 MED ORDER — SODIUM CHLORIDE 0.9% FLUSH
3.0000 mL | Freq: Two times a day (BID) | INTRAVENOUS | Status: DC
  Administered 2021-08-28 – 2021-08-31 (×5): 3 mL via INTRAVENOUS

## 2021-08-28 MED ORDER — LORAZEPAM 2 MG/ML IJ SOLN
0.5000 mg | Freq: Four times a day (QID) | INTRAMUSCULAR | Status: DC | PRN
  Administered 2021-08-28: 0.5 mg via INTRAVENOUS
  Filled 2021-08-28: qty 1

## 2021-08-28 MED ORDER — DONEPEZIL HCL 10 MG PO TABS
10.0000 mg | ORAL_TABLET | Freq: Every day | ORAL | Status: DC
  Administered 2021-08-28 – 2021-08-30 (×3): 10 mg via ORAL
  Filled 2021-08-28 (×3): qty 1

## 2021-08-28 MED ORDER — ENOXAPARIN SODIUM 30 MG/0.3ML IJ SOSY
30.0000 mg | PREFILLED_SYRINGE | INTRAMUSCULAR | Status: DC
  Administered 2021-08-28 – 2021-08-30 (×3): 30 mg via SUBCUTANEOUS
  Filled 2021-08-28 (×3): qty 0.3

## 2021-08-28 MED ORDER — POTASSIUM CHLORIDE ER 10 MEQ PO TBCR
10.0000 meq | EXTENDED_RELEASE_TABLET | Freq: Two times a day (BID) | ORAL | Status: DC
  Administered 2021-08-29 – 2021-08-31 (×5): 10 meq via ORAL
  Filled 2021-08-28 (×11): qty 1

## 2021-08-28 MED ORDER — DOCUSATE SODIUM 100 MG PO CAPS
100.0000 mg | ORAL_CAPSULE | Freq: Every day | ORAL | Status: DC
  Administered 2021-08-28 – 2021-08-31 (×3): 100 mg via ORAL
  Filled 2021-08-28 (×4): qty 1

## 2021-08-28 MED ORDER — TRAMADOL HCL 50 MG PO TABS
50.0000 mg | ORAL_TABLET | Freq: Four times a day (QID) | ORAL | Status: DC | PRN
  Administered 2021-08-29 (×3): 50 mg via ORAL
  Filled 2021-08-28 (×3): qty 1

## 2021-08-28 MED ORDER — IPRATROPIUM-ALBUTEROL 0.5-2.5 (3) MG/3ML IN SOLN: 3.0000 mL | Freq: Four times a day (QID) | RESPIRATORY_TRACT | Status: DC

## 2021-08-28 MED ORDER — UMECLIDINIUM BROMIDE 62.5 MCG/ACT IN AEPB
1.0000 | INHALATION_SPRAY | Freq: Every day | RESPIRATORY_TRACT | Status: DC
Start: 2021-08-28 — End: 2021-08-28
  Filled 2021-08-28: qty 7

## 2021-08-28 MED ORDER — ALBUTEROL SULFATE (2.5 MG/3ML) 0.083% IN NEBU
2.5000 mg | INHALATION_SOLUTION | RESPIRATORY_TRACT | Status: DC | PRN
  Administered 2021-08-30: 2.5 mg via RESPIRATORY_TRACT
  Filled 2021-08-28: qty 3

## 2021-08-28 MED ORDER — UMECLIDINIUM BROMIDE 62.5 MCG/ACT IN AEPB
1.0000 | INHALATION_SPRAY | Freq: Every day | RESPIRATORY_TRACT | Status: DC
Start: 2021-08-28 — End: 2021-08-29
  Administered 2021-08-28: 1 via RESPIRATORY_TRACT
  Filled 2021-08-28: qty 7

## 2021-08-28 MED ORDER — BUPROPION HCL ER (XL) 300 MG PO TB24
300.0000 mg | ORAL_TABLET | Freq: Every morning | ORAL | Status: DC
  Administered 2021-08-29 – 2021-08-31 (×3): 300 mg via ORAL
  Filled 2021-08-28 (×3): qty 1

## 2021-08-28 MED ORDER — SODIUM CHLORIDE 0.9 % IV SOLN
500.0000 mg | INTRAVENOUS | Status: DC
  Administered 2021-08-28: 500 mg via INTRAVENOUS
  Filled 2021-08-28: qty 500

## 2021-08-28 MED ORDER — FLUTICASONE FUROATE-VILANTEROL 100-25 MCG/ACT IN AEPB
1.0000 | INHALATION_SPRAY | Freq: Every day | RESPIRATORY_TRACT | Status: DC
Start: 2021-08-28 — End: 2021-08-28
  Filled 2021-08-28: qty 28

## 2021-08-28 MED ORDER — ACETAMINOPHEN 325 MG PO TABS: 650.0000 mg | ORAL_TABLET | Freq: Four times a day (QID) | ORAL | Status: DC | PRN

## 2021-08-28 MED ORDER — METHYLPREDNISOLONE SODIUM SUCC 125 MG IJ SOLR
125.0000 mg | Freq: Once | INTRAMUSCULAR | Status: AC
  Administered 2021-08-28: 125 mg via INTRAVENOUS
  Filled 2021-08-28: qty 2

## 2021-08-28 MED ORDER — METHYLPREDNISOLONE SODIUM SUCC 125 MG IJ SOLR
80.0000 mg | INTRAMUSCULAR | Status: DC
  Administered 2021-08-28 – 2021-08-30 (×3): 80 mg via INTRAVENOUS
  Filled 2021-08-28 (×3): qty 2

## 2021-08-28 MED ORDER — SODIUM CHLORIDE 0.9 % IV SOLN
2.0000 g | INTRAVENOUS | Status: DC
  Administered 2021-08-28 – 2021-08-30 (×3): 2 g via INTRAVENOUS
  Filled 2021-08-28 (×3): qty 20

## 2021-08-28 MED ORDER — PANTOPRAZOLE SODIUM 40 MG PO TBEC
40.0000 mg | DELAYED_RELEASE_TABLET | Freq: Every day | ORAL | Status: DC
  Administered 2021-08-28 – 2021-08-31 (×4): 40 mg via ORAL
  Filled 2021-08-28 (×4): qty 1

## 2021-08-28 MED ORDER — ATORVASTATIN CALCIUM 10 MG PO TABS
20.0000 mg | ORAL_TABLET | Freq: Every day | ORAL | Status: DC
  Administered 2021-08-28 – 2021-08-30 (×3): 20 mg via ORAL
  Filled 2021-08-28 (×3): qty 2

## 2021-08-28 MED ORDER — FLUTICASONE FUROATE-VILANTEROL 100-25 MCG/ACT IN AEPB
1.0000 | INHALATION_SPRAY | Freq: Every day | RESPIRATORY_TRACT | Status: DC
  Administered 2021-08-28: 1 via RESPIRATORY_TRACT
  Filled 2021-08-28: qty 28

## 2021-08-28 MED ORDER — BUSPIRONE HCL 15 MG PO TABS
15.0000 mg | ORAL_TABLET | Freq: Two times a day (BID) | ORAL | Status: DC
  Administered 2021-08-29 – 2021-08-31 (×5): 15 mg via ORAL
  Filled 2021-08-28 (×5): qty 1

## 2021-08-28 MED ORDER — ACETAMINOPHEN 650 MG RE SUPP: 650.0000 mg | Freq: Four times a day (QID) | RECTAL | Status: DC | PRN

## 2021-08-28 MED ORDER — IPRATROPIUM-ALBUTEROL 0.5-2.5 (3) MG/3ML IN SOLN
3.0000 mL | Freq: Once | RESPIRATORY_TRACT | Status: AC
  Administered 2021-08-28: 3 mL via RESPIRATORY_TRACT
  Filled 2021-08-28: qty 3

## 2021-08-28 MED ORDER — LEVOTHYROXINE SODIUM 25 MCG PO TABS
137.0000 ug | ORAL_TABLET | Freq: Every day | ORAL | Status: DC
  Administered 2021-08-29 – 2021-08-31 (×3): 137 ug via ORAL
  Filled 2021-08-28 (×3): qty 1

## 2021-08-28 MED ORDER — ALBUTEROL SULFATE (2.5 MG/3ML) 0.083% IN NEBU
2.5000 mg | INHALATION_SOLUTION | Freq: Four times a day (QID) | RESPIRATORY_TRACT | Status: DC
  Administered 2021-08-28 – 2021-08-29 (×3): 2.5 mg via RESPIRATORY_TRACT
  Filled 2021-08-28 (×3): qty 3

## 2021-08-28 NOTE — Progress Notes (Signed)
Placed patient on BiPAP mode per MD's order patient tolerated well

## 2021-08-28 NOTE — Plan of Care (Signed)
New pt admission from ED. Pt brought to the floor in stable condition. Vitals taken. Initial Assessment done. All immediate pertinent needs to patient addressed.  Important safety instructions relating to hospitalization reviewed with patient. Patient's Daughter is present at bed side. Pt is in 3 l oxygen during transfer to unit from ED, switched to the BIPAP with the helps of RT, Dr, Tamala Julian talked with the pt and her daughter regarding plan of care at bed side.  DNR arm band applied, Pt is alert to self, place and situation but confused with time and date,  will continue to monitor the patient  Palma Holter, RN

## 2021-08-28 NOTE — ED Provider Notes (Signed)
Emergency Medicine Provider Triage Evaluation Note  Holly [name] , a 75 y.o. female  was evaluated in triage.  Pt complains of increased SOB since Thursday. Endorses slightly elevated temperature, cough, fatigue, confusion. Desaturated to 83% on home 3L, improved with nonrebreather, now back down to 3 and 100% o2 sat. Hx COPD, lung cancer, denies continued smoking. No chest pain, NVD.  Review of Systems  Positive: Shortness of breath, cough, chills Negative: Chest pain  Physical Exam  BP 91/76 (BP Location: Right Arm)   Pulse 85   Temp 98.7 F (37.1 C) (Oral)   Resp 19   SpO2 100%  Gen:   Awake, no distress   Resp:  Normal effort  MSK:   Moves extremities without difficulty  Other:  No respiratory distress on 3L-- no TTP abdomen  Medical Decision Making  Medically screening exam initiated at 9:27 AM.  Appropriate orders placed.  Holly [name] was informed that the remainder of the evaluation will be completed by another provider, this initial triage assessment does not replace that evaluation, and the importance of remaining in the ED until their evaluation is complete.  SOB, cough   Holly [name] 08/28/21 [id number]    Holly Shanks, MD 09/02/21 [phone]

## 2021-08-28 NOTE — ED Triage Notes (Signed)
Pt to triage via [facility] from home.  Hx of COPD and Lung Cancer.  Reports increased SOB since Thursday.  Wears 3 liters [place] at home.  Temp 99.1 and confusion today.  83% on 3 liters on fire dept arrival.  Pt placed on non-rebreather by FD and sats up to 100%.  Pt back to 3 liters by EMS on arrival.  Tylenol given around 8am by family.  Pt states it was March and then stated Thanksgiving was coming up and corrected herself and said it must be November.   EMS- 18g RAC Initial BP 74/ palpated NS 215ml given BP up to 92/46

## 2021-08-28 NOTE — ED Provider Notes (Addendum)
[facility] EMERGENCY DEPARTMENT Provider Note   CSN: [id number] Arrival date & time: 08/28/21  4709     History Chief Complaint  Patient presents with   Shortness of Breath    Holly [name] is a 75 y.o. female.  HPI 75 year old female with history of COPD on home oxygen, chronic steroid therapy, lung cancer diagnosed 5 years ago on intermittent palliative XRT, hypothyroidism presents with increasing hypoxia and shortness of breath.  At baseline patient is on 3 L oxygen for COPD.  Patient's daughter reports this morning she was increasingly short of breath and oxygen reading at home was in the 70s on the patient's baseline 3 L of oxygen.  Patient*reports she tried using her CPAP but only got improvement into the 80s.  Patient has not had any significant increase in baseline coughing.  There was an associated low-grade temperature to 99.1 this morning.  Patient is on nebulizer treatments at home but did not get any improvement yesterday.  This morning, due to severity of symptoms and patient seeming confused or disoriented, her daughter did not repeat another treatment and called EMS.  Symptoms did improve after EMS arrived and temporarily placed the patient on a nonrebreather mask.  Oxygen saturation then came up to 100% and she was transition back to her baseline 3 L.  Patient does also have swelling of the lower legs.  Her daughter reports that this is typical and current level swelling is somewhat better than usual.    Past Medical History:  Diagnosis Date   Anxiety    Asthma    Basal cell carcinoma    lung cancer.  Skin cancer- basal - nose removed   Bruises easily    Cataract    Cervicalgia    COPD (chronic obstructive pulmonary disease) (HCC)    emphysema and COPD   Dyspnea    uses O2 only when needed. Uses 2 L    Foot swelling    left- has shown to Drs.   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   Hypercalcemia     Hypertension    Hypothyroidism    Mixed hyperlipidemia    Pneumonia    Pulmonary nodule    Sleep apnea    does not wear cpap   Thyroid neoplasm    Urinary incontinence    Wears dentures    Wears glasses     Patient Active Problem List   Diagnosis Date Noted   Acute respiratory failure with hypoxia and hypercapnia (HCC)    Septic shock ([place])    Goals of care, counseling/discussion    Acute on chronic respiratory failure ([place]) 07/23/2021   Hypotension 02/23/2021   Hypokalemia 02/23/2021   Acute renal failure superimposed on stage 3a chronic kidney disease ([place]) 02/22/2021   GI bleed 02/16/2021   ABLA (acute blood loss anemia) 02/16/2021   Diverticulitis 02/16/2021   Current chronic use of systemic steroids 02/16/2021   Pressure injury of skin 02/16/2021   Olecranon bursitis of right elbow 10/02/2020   Ulna, olecranon process fracture, right, closed, with routine healing, subsequent encounter 10/01/2020   Primary malignant neoplasm of left upper lobe of lung ([place]) 07/09/2019   Closed fracture of right olecranon process 12/04/2017   Cough 11/11/2015   Chronic respiratory failure with hypoxia ([place]) 11/11/2014   Cigarette smoker 10/16/2014   Multinodular thyroid 07/30/2014   Neoplasm of uncertain behavior of thyroid gland, 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   COPD exacerbation ([place]) 02/11/2013   Pedal edema 02/11/2013   COPD GOLD III  06/11/2010   CARCINOMA, BASAL CELL, 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   HYPERLIPIDEMIA, MIXED 05/13/2010   HYPERCALCEMIA 05/13/2010   ANXIETY 05/13/2010   Essential hypertension 05/13/2010   CERVICALGIA 05/13/2010   OSTEOPENIA 05/13/2010   URINARY INCONTINENCE 05/13/2010   CARCINOMA, BASAL CELL, 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    Past Surgical History:  Procedure Laterality Date   BIOPSY  02/17/2021   Procedure: BIOPSY;  Surgeon: Otis Brace, MD;  Location: [facility];  Service: Gastroenterology;;   COLONOSCOPY     DENTAL SURGERY     ELBOW ARTHROPLASTY Right 2020    ESOPHAGOGASTRODUODENOSCOPY (EGD) WITH PROPOFOL N/A 02/17/2021   Procedure: ESOPHAGOGASTRODUODENOSCOPY (EGD) WITH PROPOFOL;  Surgeon: Otis Brace, MD;  Location: [facility];  Service: Gastroenterology;  Laterality: N/A;   EYE SURGERY Right    cataract surgery   FOOT SURGERY  30 YRS AGO   BILATERAL   HARDWARE REMOVAL Right 10/02/2020   Procedure: HARDWARE REMOVAL RIGHT ELBOW;  Surgeon: Shona Needles, MD;  Location: [facility];  Service: Orthopedics;  Laterality: Right;   I & D EXTREMITY Right 01/13/2021   Procedure: IRRIGATION AND DEBRIDEMENT RIGHT ELBOW;  Surgeon: Shona Needles, MD;  Location: [facility];  Service: Orthopedics;  Laterality: Right;   ORIF ELBOW FRACTURE Right 12/06/2017   Procedure: OPEN REDUCTION INTERNAL FIXATION (ORIF) ELBOW/OLECRANON FRACTURE;  Surgeon: Shona Needles, MD;  Location: [facility];  Service: Orthopedics;  Laterality: Right;   SKIN CANCER EXCISION     THYROIDECTOMY N/A 07/31/2014   Procedure: TOTAL THYROIDECTOMY;  Surgeon: Armandina Gemma, MD;  Location: [facility];  Service: General;  Laterality: N/A;   VAGINAL HYSTERECTOMY     VIDEO BRONCHOSCOPY WITH ENDOBRONCHIAL NAVIGATION Bilateral 06/25/2019   Procedure: VIDEO BRONCHOSCOPY WITH ENDOBRONCHIAL NAVIGATION;  Surgeon: Lajuana Matte, MD;  Location: [facility];  Service: Thoracic;  Laterality: Bilateral;     OB History   No obstetric history on file.     Family History  Problem Relation Age of Onset   Coronary artery disease Father    Hypertension Father    Asthma Father    Allergies Father    Hypertension Mother    Lung cancer Mother     Social History   Tobacco Use   Smoking status: Former    Packs/day: 1.00    Years: 30.00    Pack years: 30.00    Types: Cigarettes    Quit date: 02/24/2015    Years since quitting: 6.5   Smokeless tobacco: Never  Vaping Use   Vaping Use: Former  Substance Use Topics   Alcohol use: Yes    Alcohol/week: 0.0 standard drinks    Comment: very seldom   Drug use: Never     Home Medications Prior to Admission medications   Medication Sig Start Date End Date Taking? Authorizing Provider  ALPRAZolam Duanne Moron) 0.5 MG tablet Take 0.5 mg by mouth 2 (two) times daily as needed for anxiety. 09/14/20   [provider]  atorvastatin (LIPITOR) 20 MG tablet Take 20 mg by mouth at bedtime.     [provider]  buPROPion (WELLBUTRIN XL) 300 MG 24 hr tablet Take 300 mg by mouth every morning. 07/08/21   [provider]  donepezil (ARICEPT) 10 MG tablet Take 10 mg by mouth at bedtime. 12/17/20   [provider]  Fluticasone-Umeclidin-Vilant 100-62.5-25 MCG/INH AEPB Inhale 1 puff into the lungs daily. Trelegy    [provider]  furosemide (LASIX) 20 MG tablet Take 1 tablet (20 mg total) by mouth daily. 08/05/21   Oswald Hillock, MD  ipratropium-albuterol (DUONEB) 0.5-2.5 (3) MG/3ML SOLN Inhale 3 mLs into the lungs every 4 (four) hours as needed (respiratory issues.). 01/29/20   [provider]  levothyroxine (SYNTHROID) 137 MCG tablet Take 137 mcg by mouth daily before breakfast. 09/22/20   [provider]  losartan (COZAAR) 50 MG tablet Take 1 tablet (50 mg total) by mouth daily. 08/03/21   Oswald Hillock, MD  metoprolol succinate (TOPROL-XL) 50 MG 24 hr tablet Take 50 mg by mouth daily. Take with or immediately following a meal.    [provider]  pantoprazole (PROTONIX) 40 MG tablet Take 1 tablet (40 mg total) by mouth daily. 08/02/21 11/30/21  Oswald Hillock, MD  polyethylene glycol (MIRALAX / GLYCOLAX) 17 g packet Take 17 g by mouth daily as needed for moderate constipation. 08/02/21   Oswald Hillock, MD  predniSONE (DELTASONE) 20 MG tablet Take 20 mg by mouth daily. 07/08/21   [provider]  VENTOLIN HFA 108 (90 Base) MCG/ACT inhaler INHALE 2 PUFFS INTO THE LUNGS EVERY 4 HOURS AS NEEDED FOR WHEEZING ORSHORTNESS OF BREATH Patient taking differently: Inhale 2 puffs into the lungs every 4 (four) hours as  needed for wheezing or shortness of breath. 06/29/17   Parrett, Fonnie Mu, NP    Allergies    Amlodipine besylate, Codeine, and Oxycodone-acetaminophen  Review of Systems   Review of Systems 10 systems reviewed and negative except as per HPI Physical Exam Updated Vital Signs BP (!) 117/54 (BP Location: Right Arm)   Pulse 81   Temp 98.2 F (36.8 C) (Oral)   Resp (!) 31   SpO2 100%   Physical Exam Constitutional:      Comments: Patient is alert.  Deconditioned in appearance.  Mild to moderate increased work of breathing at rest.  HENT:     Head: Normocephalic and atraumatic.     Mouth/Throat:     Pharynx: Oropharynx is clear.  Eyes:     Extraocular Movements: Extraocular movements intact.  Cardiovascular:     Comments: Heart regular.  Distant heart sounds.  Do not appreciate murmur.  Possible friction rub versus crackle in the lungs. Pulmonary:     Comments: Mild increased work of breathing.  Breath sounds are diffusely very soft.  Some crackle at the bases Abdominal:     General: There is no distension.     Palpations: Abdomen is soft.     Tenderness: There is no abdominal tenderness. There is no guarding.  Musculoskeletal:     Comments: 1-2+ pitting edema bilateral feet.  The right slightly greater than left.  Patient daughter reports this as better than typical.  Patient has various age of abrasions to the pretibial surfaces.  Skin:    General: Skin is warm and dry.  Neurological:     General: No focal deficit present.     Coordination: Coordination normal.     Comments: Patient seems mildly confused but is situationally appropriate.  She is attentive and responding with appropriate answers.  No appearance of acute delirium.  Patient is not somnolent.    ED Results / Procedures / Treatments   Labs (all labs ordered are listed, but only abnormal results are displayed) Labs Reviewed  BASIC METABOLIC PANEL - Abnormal; Notable for the following components:      Result  Value   Chloride 92 (*)  CO2 39 (*)    Glucose, Bld 109 (*)    BUN 27 (*)    Creatinine, Ser 1.51 (*)    GFR, Estimated 36 (*)    All other components within normal limits  CBC - Abnormal; Notable for the following components:   WBC 13.1 (*)    RBC 2.83 (*)    Hemoglobin 8.0 (*)    HCT 28.1 (*)    MCHC 28.5 (*)    RDW 16.2 (*)    All other components within normal limits  TROPONIN I (HIGH SENSITIVITY) - Abnormal; Notable for the following components:   Troponin I (High Sensitivity) 37 (*)    All other components within normal limits  RESP PANEL BY RT-PCR (FLU A&B, COVID) ARPGX2  HEPATIC FUNCTION PANEL  BRAIN NATRIURETIC PEPTIDE  LACTIC ACID, PLASMA  LACTIC ACID, PLASMA  AMMONIA  I-STAT VENOUS BLOOD GAS, ED  TROPONIN I (HIGH SENSITIVITY)    EKG EKG Interpretation  Date/Time:  Saturday August 28 2021 09:27:11 EST Ventricular Rate:  86 PR Interval:  120 QRS Duration: 72 QT Interval:  356 QTC Calculation: 426 R Axis:   66 Text Interpretation: Normal sinus rhythm Normal ECG no sig change from prevous Confirmed by Charlesetta Shanks [phone]) on 08/28/2021 11:47:22 AM  Radiology DG Chest 2 View  Result Date: 08/28/2021 CLINICAL DATA:  Marcelyn Ditty , a 75 y.o. female was evaluated in triage. Pt complains of increased SOB since Thursday. Endorses slightly elevated temperature, cough, fatigue, confusion. Desaturated to 83% on home 3L, improved with nonrebreath.*comment was truncated*SOB EXAM: CHEST - 2 VIEW COMPARISON:  07/24/2021, chest radiograph and CT FINDINGS: Normal cardiac silhouette. Again demonstrated 2 cm nodule in the RIGHT middle lobe. No effusion, infiltrate pneumothorax. No acute osseous abnormality. IMPRESSION: 1.  No acute cardiopulmonary process. 2. Large RIGHT middle lobe nodule unchanged from recent CT. Nodule described as suspicious for pulmonary neoplasm on CT 07/24/2021. Electronically Signed   By: Suzy Bouchard M.D.   On: 08/28/2021 10:11     Procedures Procedures  CRITICAL CARE Performed by: Charlesetta Shanks   Total critical care time: 60 minutes  Critical care time was exclusive of separately billable procedures and treating other patients.  Critical care was necessary to treat or prevent imminent or life-threatening deterioration.  Critical care was time spent personally by me on the following activities: development of treatment plan with patient and/or surrogate as well as nursing, discussions with consultants, evaluation of patient's response to treatment, examination of patient, obtaining history from patient or surrogate, ordering and performing treatments and interventions, ordering and review of laboratory studies, ordering and review of radiographic studies, pulse oximetry and re-evaluation of patient's condition.  Medications Ordered in ED Medications  ipratropium-albuterol (DUONEB) 0.5-2.5 (3) MG/3ML nebulizer solution 3 mL (has no administration in time range)    ED Course  I have reviewed the triage vital signs and the nursing notes.  Pertinent labs & imaging results that were available during my care of the patient were reviewed by me and considered in my medical decision making (see chart for details).    MDM Rules/Calculators/A&P                           Patient presents with significant comorbid illness with history of COPD oxygen dependent and lung cancer.  Patient has increasing oxygen demand this morning.  She was also much more short of breath per her daughter.  Along with that she was  exhibiting some confusion and disorientation.  At this time she is maintaining oxygen saturation on 3 L.  She does have worsening renal function with significant drop in GFR and peripheral edema.  Possibly element of CHF as well.  At this time, chest x-ray does not identify acute process but a persisting large right middle lobe nodule suspicious for neoplasm.  Venous gases currently pending.  Respiratory failure may be  multifactorial.  Venous gas confirms hypercapnic respiratory failure in addition to hypoxic respiratory failure.  Patient's mental status is clear to respond and she is not somnolent.  Appropriate for BiPAP.  Will initiate BiPAP and plan for admission.  Recheck 13: 30.  Patient is comfortable on BiPAP.  Blood pressures are little soft in the 80s to 90s.  Will add 500 cc fluid bolus.  Consult: Dr. Tamala Julian [facility] hospitalist for admission Final Clinical Impression(s) / ED Diagnoses Final diagnoses:  Acute on chronic respiratory failure with hypoxia ([place])  Severe comorbid illness    Rx / DC Orders ED Discharge Orders     None        Charlesetta Shanks, MD 08/28/21 [place]    Charlesetta Shanks, MD 08/28/21 1349

## 2021-08-28 NOTE — ED Notes (Signed)
RT notified of bipap orders.

## 2021-08-28 NOTE — H&P (Signed)
History and Physical    Holly [name] KDT:[id number] DOB: [birth year] DOA: 08/28/2021  Referring MD/NP/PA: Charlesetta Shanks, MD PCP: Harlan Stains, MD  Patient coming from: home via EMS  Chief Complaint: Shortness of breath  I have personally briefly reviewed patient's old medical records in [place]   HPI: Holly [name] is a 75 y.o. 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  Just recently hospitalized from 10/7- 10/17 with acute on chronic hypoxic and hypercapnic respiratory failure requiring intubation.  She had been recently referred to Dr. Lisbeth Renshaw of radiation oncology and patient notes that plans have been made for her to have a PET scan on the 17th of this to evaluate for the new right lung mass during her previous hospitalization.  At home patient had been getting more progressively short of breath over the last few days.  She reports that she gets anxious which makes her breathing more short.  The patient had been having fevers up to 101.9 F 2 days ago.  They have been treating her with Tylenol with intermittent improvement in symptoms.  She had had a productive cough and denies seeing any blood present.  Associated symptoms include wheezing and some leg swelling.  This morning her daughter had tried to get her up to go to the bathroom and doing and doing so noted that the patient was confused.  Her oxygenation had dropped down into the 70s.  Her daughter tried to place her on CPAP, but noted no improvement in her oxygenation which she called EMS.  Daughter makes note that this is likely fifth time that this is happened where her CO2 levels were too high.  On arrival of fire department patient was noted to have O2 saturations of 83% on 3 L and was placed on a nonrebreather with  saturations improved to 100%.  Initial blood pressure was noted to be 74 palpated and she was given 200 mL of normal saline IV fluids with repeat check 92/46.  ED Course: Upon admission to the emergency department patient was seen to be afebrile with respirations 12-31, blood pressure 91/70 6-1 17/54, and O2 saturation initially maintained on 3 L nasal cannula oxygen.  Venous blood gas was significant for pH 7.361, PCO2 81, and PaO2 46.  Orders were placed to transition patient to BiPAP.  Chest x-ray noted a large right middle lobe nodule unchanged from recent CT from 07/24/2021 without any acute cardiopulmonary process appreciated.  Patient was ordered Solu-Medrol 125 mg IV, DuoNeb breathing treatment, and given 500 mL of IV fluids due to soft blood pressures.  Review of Systems  Constitutional:  Positive for fever and malaise/fatigue.  HENT:  Negative for congestion and nosebleeds.   Eyes:  Negative for photophobia and pain.  Respiratory:  Positive for cough, sputum production and shortness of breath. Negative for hemoptysis.   Cardiovascular:  Positive for leg swelling. Negative for chest pain.  Gastrointestinal:  Negative for diarrhea, nausea and vomiting.  Genitourinary:  Negative for dysuria and hematuria.  Musculoskeletal:  Negative for falls.  Skin:  Negative for rash.  Neurological:  Negative for focal weakness and loss of consciousness.  Psychiatric/Behavioral:  Negative for substance abuse.    Past Medical History:  Diagnosis Date   Anxiety    Asthma    Basal cell carcinoma    lung  cancer.  Skin cancer- basal - nose removed   Bruises easily    Cataract    Cervicalgia    COPD (chronic obstructive pulmonary disease) (HCC)    emphysema and COPD   Dyspnea    uses O2 only when needed. Uses 2 L    Foot swelling    left- has shown to Drs.   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   Hypercalcemia    Hypertension    Hypothyroidism     Mixed hyperlipidemia    Pneumonia    Pulmonary nodule    Sleep apnea    does not wear cpap   Thyroid neoplasm    Urinary incontinence    Wears dentures    Wears glasses     Past Surgical History:  Procedure Laterality Date   BIOPSY  02/17/2021   Procedure: BIOPSY;  Surgeon: Otis Brace, MD;  Location: [facility];  Service: Gastroenterology;;   COLONOSCOPY     DENTAL SURGERY     ELBOW ARTHROPLASTY Right 2020   ESOPHAGOGASTRODUODENOSCOPY (EGD) WITH PROPOFOL N/A 02/17/2021   Procedure: ESOPHAGOGASTRODUODENOSCOPY (EGD) WITH PROPOFOL;  Surgeon: Otis Brace, MD;  Location: [facility];  Service: Gastroenterology;  Laterality: N/A;   EYE SURGERY Right    cataract surgery   FOOT SURGERY  30 YRS AGO   BILATERAL   HARDWARE REMOVAL Right 10/02/2020   Procedure: HARDWARE REMOVAL RIGHT ELBOW;  Surgeon: Shona Needles, MD;  Location: [facility];  Service: Orthopedics;  Laterality: Right;   I & D EXTREMITY Right 01/13/2021   Procedure: IRRIGATION AND DEBRIDEMENT RIGHT ELBOW;  Surgeon: Shona Needles, MD;  Location: [facility];  Service: Orthopedics;  Laterality: Right;   ORIF ELBOW FRACTURE Right 12/06/2017   Procedure: OPEN REDUCTION INTERNAL FIXATION (ORIF) ELBOW/OLECRANON FRACTURE;  Surgeon: Shona Needles, MD;  Location: [facility];  Service: Orthopedics;  Laterality: Right;   SKIN CANCER EXCISION     THYROIDECTOMY N/A 07/31/2014   Procedure: TOTAL THYROIDECTOMY;  Surgeon: Armandina Gemma, MD;  Location: [facility];  Service: General;  Laterality: N/A;   VAGINAL HYSTERECTOMY     VIDEO BRONCHOSCOPY WITH ENDOBRONCHIAL NAVIGATION Bilateral 06/25/2019   Procedure: VIDEO BRONCHOSCOPY WITH ENDOBRONCHIAL NAVIGATION;  Surgeon: Lajuana Matte, MD;  Location: [facility];  Service: Thoracic;  Laterality: Bilateral;     reports that she quit smoking about 6 years ago. Her smoking use included cigarettes. She has a 30.00 pack-year smoking history. She has never used smokeless tobacco. She reports current alcohol  use. She reports that she does not use drugs.  Allergies  Allergen Reactions   Amlodipine Besylate Swelling    Edema   Codeine Nausea And Vomiting   Oxycodone-Acetaminophen Nausea And Vomiting    Family History  Problem Relation Age of Onset   Coronary artery disease Father    Hypertension Father    Asthma Father    Allergies Father    Hypertension Mother    Lung cancer Mother     Prior to Admission medications   Medication Sig Start Date End Date Taking? Authorizing Provider  acetaminophen (TYLENOL) 325 MG tablet Take 650 mg by mouth every 6 (six) hours as needed for mild pain.   Yes [provider]  ALPRAZolam Duanne Moron) 0.5 MG tablet Take 0.5 mg by mouth 2 (two) times daily as needed for anxiety. 09/14/20  Yes [provider]  atorvastatin (LIPITOR) 20 MG tablet Take 20 mg by  mouth at bedtime.    Yes [provider]  buPROPion (WELLBUTRIN XL) 300 MG 24 hr tablet Take 300 mg by mouth every morning. 07/08/21  Yes [provider]  busPIRone (BUSPAR) 30 MG tablet Take 15 mg by mouth in the morning and at bedtime.   Yes [provider]  Docusate Sodium (COLACE PO) Take 1-2 tablets by mouth daily.   Yes [provider]  donepezil (ARICEPT) 10 MG tablet Take 10 mg by mouth at bedtime. 12/17/20  Yes [provider]  Fluticasone-Umeclidin-Vilant 100-62.5-25 MCG/INH AEPB Inhale 1 puff into the lungs daily. Trelegy   Yes [provider]  furosemide (LASIX) 20 MG tablet Take 1 tablet (20 mg total) by mouth daily. 08/05/21  Yes Oswald Hillock, MD  levothyroxine (SYNTHROID) 137 MCG tablet Take 137 mcg by mouth daily before breakfast. 09/22/20  Yes [provider]  losartan (COZAAR) 50 MG tablet Take 1 tablet (50 mg total) by mouth daily. 08/03/21  Yes Oswald Hillock, MD  metoprolol succinate (TOPROL-XL) 50 MG 24 hr tablet Take 50 mg by mouth daily. Take with or immediately following a meal.   Yes [provider]   mirtazapine (REMERON) 15 MG tablet Take 15 mg by mouth at bedtime.   Yes [provider]  pantoprazole (PROTONIX) 40 MG tablet Take 1 tablet (40 mg total) by mouth daily. 08/02/21 11/30/21 Yes Lama, Marge Duncans, MD  potassium chloride (KLOR-CON) 10 MEQ tablet Take 1 tablet by mouth 2 (two) times daily with a meal.   Yes [provider]  predniSONE (DELTASONE) 20 MG tablet Take 40 mg by mouth daily. Take 40mg  daily for five days (08/25/21-08/29/21) then go back to 20mg  daily continuous. 07/08/21  Yes [provider]  traMADol (ULTRAM) 50 MG tablet Take 50 mg by mouth every 6 (six) hours as needed for moderate pain. 08/03/21  Yes [provider]  VENTOLIN HFA 108 (90 Base) MCG/ACT inhaler INHALE 2 PUFFS INTO THE LUNGS EVERY 4 HOURS AS NEEDED FOR WHEEZING ORSHORTNESS OF BREATH Patient taking differently: Inhale 2 puffs into the lungs every 4 (four) hours as needed for wheezing or shortness of breath. 06/29/17  Yes Parrett, Tammy S, NP  polyethylene glycol (MIRALAX / GLYCOLAX) 17 g packet Take 17 g by mouth daily as needed for moderate constipation. Patient not taking: Reported on 08/28/2021 08/02/21   Oswald Hillock, MD    Physical Exam:  Constitutional: Elderly female who appears to be chronically ill Vitals:   08/28/21 1116 08/28/21 1130 08/28/21 1230 08/28/21 1300  BP: (!) 117/54 (!) 112/54 (!) 110/56 (!) 102/51  Pulse: 81 80 77 75  Resp: (!) 31 (!) 26 (!) 23 (!) 25  Temp: 98.2 F (36.8 C)     TempSrc: Oral     SpO2: 100% 100% 100% 100%   Eyes: PERRL, lids and conjunctivae normal ENMT: Mucous membranes are moist. Posterior pharynx clear of any exudate or lesions.   Neck: normal, supple, no masses, no thyromegaly Respiratory: Decreased overall aeration with positive expiratory wheezes appreciated and some mild crackles noted at the bases. Cardiovascular: Regular rate and rhythm, no murmurs / rubs / gallops.  1+ pitting lower extremity edema. 2+ pedal pulses.  No carotid bruits.  Abdomen: no tenderness, no masses palpated. No hepatosplenomegaly. Bowel sounds positive.  Musculoskeletal: no clubbing / cyanosis. No joint deformity upper and lower extremities. Good ROM, no contractures. Normal muscle tone.  Skin: no rashes, lesions, ulcers. No induration Neurologic: CN 2-12 grossly  intact. Sensation intact, DTR normal. Strength 5/5 in all 4.  Psychiatric: Alert and oriented to person and place, but does intermittently appear to be confused and gives wrong answers to certain questions    Labs on Admission: I have personally reviewed following labs and imaging studies  CBC: Recent Labs  Lab 08/28/21 0929 08/28/21 1213  WBC 13.1*  --   HGB 8.0* 9.2*  HCT 28.1* 27.0*  MCV 99.3  --   PLT 200  --    Basic Metabolic Panel: Recent Labs  Lab 08/28/21 0929 08/28/21 1213  NA 137 138  K 3.8 3.8  CL 92*  --   CO2 39*  --   GLUCOSE 109*  --   BUN 27*  --   CREATININE 1.51*  --   CALCIUM 9.3  --    GFR: CrCl cannot be calculated (Unknown ideal weight.). Liver Function Tests: Recent Labs  Lab 08/28/21 1127  AST 16  ALT 13  ALKPHOS 130*  BILITOT 0.8  PROT 5.9*  ALBUMIN 2.1*   No results for input(s): LIPASE, AMYLASE in the last 168 hours. Recent Labs  Lab 08/28/21 1134  AMMONIA <10   Coagulation Profile: No results for input(s): INR, PROTIME in the last 168 hours. Cardiac Enzymes: No results for input(s): CKTOTAL, CKMB, CKMBINDEX, TROPONINI in the last 168 hours. BNP (last 3 results) No results for input(s): PROBNP in the last 8760 hours. HbA1C: No results for input(s): HGBA1C in the last 72 hours. CBG: No results for input(s): GLUCAP in the last 168 hours. Lipid Profile: No results for input(s): CHOL, HDL, LDLCALC, TRIG, CHOLHDL, LDLDIRECT in the last 72 hours. Thyroid Function Tests: No results for input(s): TSH, T4TOTAL, FREET4, T3FREE, THYROIDAB in the last 72 hours. Anemia Panel: No results for input(s): VITAMINB12,  FOLATE, FERRITIN, TIBC, IRON, RETICCTPCT in the last 72 hours. Urine analysis:    Component Value Date/Time   COLORURINE YELLOW 07/23/2021 2031   APPEARANCEUR HAZY (A) 07/23/2021 2031   LABSPEC 1.013 07/23/2021 2031   PHURINE 6.0 07/23/2021 2031   GLUCOSEU NEGATIVE 07/23/2021 2031   HGBUR NEGATIVE 07/23/2021 2031   [place] NEGATIVE 07/23/2021 2031   [place] NEGATIVE 07/23/2021 2031   PROTEINUR 30 (A) 07/23/2021 2031   NITRITE NEGATIVE 07/23/2021 2031   LEUKOCYTESUR NEGATIVE 07/23/2021 2031   Sepsis Labs: Recent Results (from the past 240 hour(s))  Resp Panel by RT-PCR (Flu A&B, Covid) Nasopharyngeal Swab     Status: None   Collection Time: 08/28/21  9:28 AM   Specimen: Nasopharyngeal Swab; Nasopharyngeal(NP) swabs in vial transport medium  Result Value Ref Range Status   SARS Coronavirus 2 by RT PCR NEGATIVE NEGATIVE Final    Comment: (NOTE) SARS-CoV-2 target nucleic acids are NOT DETECTED.  The SARS-CoV-2 RNA is generally detectable in upper respiratory specimens during the acute phase of infection. The lowest concentration of SARS-CoV-2 viral copies this assay can detect is 138 copies/mL. A negative result does not preclude SARS-Cov-2 infection and should not be used as the sole basis for treatment or other patient management decisions. A negative result may occur with  improper specimen collection/handling, submission of specimen other than nasopharyngeal swab, presence of viral mutation(s) within the areas targeted by this assay, and inadequate number of viral copies(<138 copies/mL). A negative result must be combined with clinical observations, patient history, and epidemiological information. The expected result is Negative.  Fact Sheet for Patients:  EntrepreneurPulse.com.au  Fact Sheet for Healthcare Providers:  IncredibleEmployment.be  This test is no t yet  approved or cleared by the [place] and  has been  authorized for detection and/or diagnosis of SARS-CoV-2 by FDA under an Emergency Use Authorization (EUA). This EUA will remain  in effect (meaning this test can be used) for the duration of the COVID-19 declaration under Section 564(b)(1) of the Act, 21 U.S.C.section 360bbb-3(b)(1), unless the authorization is terminated  or revoked sooner.       Influenza A by PCR NEGATIVE NEGATIVE Final   Influenza B by PCR NEGATIVE NEGATIVE Final    Comment: (NOTE) The Xpert Xpress SARS-CoV-2/FLU/RSV plus assay is intended as an aid in the diagnosis of influenza from Nasopharyngeal swab specimens and should not be used as a sole basis for treatment. Nasal washings and aspirates are unacceptable for Xpert Xpress SARS-CoV-2/FLU/RSV testing.  Fact Sheet for Patients: EntrepreneurPulse.com.au  Fact Sheet for Healthcare Providers: IncredibleEmployment.be  This test is not yet approved or cleared by the [place] FDA and has been authorized for detection and/or diagnosis of SARS-CoV-2 by FDA under an Emergency Use Authorization (EUA). This EUA will remain in effect (meaning this test can be used) for the duration of the COVID-19 declaration under Section 564(b)(1) of the Act, 21 U.S.C. section 360bbb-3(b)(1), unless the authorization is terminated or revoked.  Performed at [facility], [place] [address] [place],  [address]      Radiological Exams on Admission: DG Chest 2 View  Result Date: 08/28/2021 CLINICAL DATA:  KERRY ODONOHUE , a 75 y.o. female was evaluated in triage. Pt complains of increased SOB since Thursday. Endorses slightly elevated temperature, cough, fatigue, confusion. Desaturated to 83% on home 3L, improved with nonrebreath.*comment was truncated*SOB EXAM: CHEST - 2 VIEW COMPARISON:  07/24/2021, chest radiograph and CT FINDINGS: Normal cardiac silhouette. Again demonstrated 2 cm nodule in the RIGHT middle lobe. No effusion,  infiltrate pneumothorax. No acute osseous abnormality. IMPRESSION: 1.  No acute cardiopulmonary process. 2. Large RIGHT middle lobe nodule unchanged from recent CT. Nodule described as suspicious for pulmonary neoplasm on CT 07/24/2021. Electronically Signed   By: Suzy Bouchard M.D.   On: 08/28/2021 10:11    EKG: Independently reviewed.  Normal sinus rhythm 86 bpm  Assessment/Plan Acute on chronic respiratory failure with hypoxia and hypercapnia secondary to COPD exacerbation: Patient presents with worsening shortness of breath with cough and wheezing.  Reported to have fevers up to 101 F prior to arrival.  Chest x-ray showed no acute normality with large right middle lobe nodule unchanged from recent CT from 10/8 concerning for neoplasm.  Initial venous blood gas significant for pH 7.361, PCO2 81, and PO2 46.  Patient has been placed on BiPAP on ED. -Admit to a progressive bed -Continuous pulse oximetry with continuous nasal cannula oxygen to maintain O2 saturation -BiPAP at night -Breathing treatment 4 times daily and as needed -Solu-Medrol 40 mg IV twice daily.  Transition to p.o. when medically appropriate -Empiric antibiotics of Rocephin and azithromycin in case of bacterial infection  SIRS Leukocytosis fever: WBC elevated at 13.1 with reports of fever up to 101 F at home.  Initial lactic acid was reassuring at 1.3 leukocytosis could be secondary to steroids, but with reports of fever question possibility of underlying infection.  Suspect possible respiratory source. -Check blood cultures -Check procalcitonin -Empiric antibiotics as noted above  Essential hypertension: Initial blood pressures noted to be 91/76 ~128/52.  Home blood pressure medications losartan 50 mg daily, furosemide 20 mg daily, and metoprolol 50 mg daily. -Held losartan, furosemide, and  carvedilol due to initial low blood pressures and concern for AKI -Reassess in a.m. and resume medications as deemed medically  appropriate  Acute metabolic encephalopathy: Daughter reported that the patient was initially confused altered.  Suspect secondary to hypercapnia. 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  Currently on palliative radiation for a new right middle lobe mass that increased in size to 2 x 1.7 x 2.4 mm per last CT.  Seen by radiation oncology in the outpatient setting with plans for PET scan 11/17. -Dr. Lisbeth Renshaw sent notification of the patient admission into the hospital  Acute kidney injury: Patient baseline creatinine noted to be around 1, but presents with creatinine elevated up to 1.51 with BUN 27.  Suspect possibly secondary to hypoperfusion. -Avoid nephrotoxic agent -Continue to monitor kidney function daily  Diastolic congestive heart failure: Last EF noted to be 65 to 70% with grade 1 dysfunction by echocardiogram from 07/25/2021.  BNP was slightly elevated at 232.5.  Chest x-ray did not show any signs of fluid overload.  Due to initial soft blood pressures patient had initially been given gentle fluid bolus, but may diuresis later on. -Strict intake and output -Daily weights  Anemia of chronic disease: On admission hemoglobin was noted to be 8, but repeat check 9.2 which appears similar to previous.  Last EGD in 02/2021 had showed nonbleeding ulcer without signs of active GI bleed.  During her last hospital admission last month she required transfusion of 1 unit of packed red blood cells. -Type and screen for the possible need of blood product  Anxiety -Ativan IV as needed for anxiety while on BiPAP -Continue home medication regimen once able  Hypothyroidism: Last TSH was 0.148 on 07/24/2021. -Continue levothyroxine   GERD -Continue Protonix  DNR: Present on admission. - palliative care consulted  DVT prophylaxis: Lovenox Code Status: DNR Family Communication: Daughter updated at  bedside Disposition Plan: TBD Consults called: Palliative care Admission status: inpatient, likely require more than 2 midnight stay case of underlying infection  Norval Morton MD [facility]   If 7PM-7AM, please contact night-coverage   08/28/2021, 1:58 PM

## 2021-08-29 DIAGNOSIS — R7881 Bacteremia: Secondary | ICD-10-CM

## 2021-08-29 DIAGNOSIS — B9620 Unspecified Escherichia coli [E. coli] as the cause of diseases classified elsewhere: Secondary | ICD-10-CM

## 2021-08-29 DIAGNOSIS — A419 Sepsis, unspecified organism: Secondary | ICD-10-CM

## 2021-08-29 DIAGNOSIS — Z7189 Other specified counseling: Secondary | ICD-10-CM

## 2021-08-29 DIAGNOSIS — N390 Urinary tract infection, site not specified: Secondary | ICD-10-CM

## 2021-08-29 DIAGNOSIS — Z515 Encounter for palliative care: Secondary | ICD-10-CM

## 2021-08-29 LAB — URINALYSIS, ROUTINE W REFLEX MICROSCOPIC
Bilirubin Urine: NEGATIVE
Glucose, UA: NEGATIVE mg/dL
Ketones, ur: NEGATIVE mg/dL
Nitrite: POSITIVE — AB
Protein, ur: NEGATIVE mg/dL
Specific Gravity, Urine: 1.012 (ref 1.005–1.030)
pH: 5 (ref 5.0–8.0)

## 2021-08-29 LAB — BLOOD CULTURE ID PANEL (REFLEXED) - BCID2

## 2021-08-29 LAB — CBC
HCT: 26.1 % — ABNORMAL LOW (ref 36.0–46.0)
Hemoglobin: 7.5 g/dL — ABNORMAL LOW (ref 12.0–15.0)
MCH: 28.1 pg (ref 26.0–34.0)
MCHC: 28.7 g/dL — ABNORMAL LOW (ref 30.0–36.0)
MCV: 97.8 fL (ref 80.0–100.0)
Platelets: 170 10*3/uL (ref 150–400)
RBC: 2.67 MIL/uL — ABNORMAL LOW (ref 3.87–5.11)
RDW: 16.3 % — ABNORMAL HIGH (ref 11.5–15.5)
WBC: 12.8 10*3/uL — ABNORMAL HIGH (ref 4.0–10.5)
nRBC: 0 % (ref 0.0–0.2)

## 2021-08-29 LAB — BASIC METABOLIC PANEL
Anion gap: 8 (ref 5–15)
BUN: 30 mg/dL — ABNORMAL HIGH (ref 8–23)
CO2: 37 mmol/L — ABNORMAL HIGH (ref 22–32)
Calcium: 8.9 mg/dL (ref 8.9–10.3)
Chloride: 91 mmol/L — ABNORMAL LOW (ref 98–111)
Creatinine, Ser: 1.46 mg/dL — ABNORMAL HIGH (ref 0.44–1.00)
GFR, Estimated: 37 mL/min — ABNORMAL LOW (ref >60)
Glucose, Bld: 136 mg/dL — ABNORMAL HIGH (ref 70–99)
Potassium: 4.8 mmol/L (ref 3.5–5.1)
Sodium: 136 mmol/L (ref 135–145)

## 2021-08-29 LAB — BLOOD GAS, ARTERIAL
Acid-Base Excess: 14.7 mmol/L — ABNORMAL HIGH (ref 0.0–2.0)
Bicarbonate: 40.1 mmol/L — ABNORMAL HIGH (ref 20.0–28.0)
FIO2: 32
O2 Saturation: 91.9 %
Patient temperature: 37
pCO2 arterial: 63.9 mmHg — ABNORMAL HIGH (ref 32.0–48.0)
pH, Arterial: 7.414 (ref 7.350–7.450)
pO2, Arterial: 64.5 mmHg — ABNORMAL LOW (ref 83.0–108.0)

## 2021-08-29 LAB — TYPE AND SCREEN
ABO/RH(D): O POS
Antibody Screen: NEGATIVE

## 2021-08-29 MED ORDER — FLUTICASONE FUROATE-VILANTEROL 100-25 MCG/ACT IN AEPB
1.0000 | INHALATION_SPRAY | Freq: Every day | RESPIRATORY_TRACT | Status: DC
  Administered 2021-08-29: 1 via RESPIRATORY_TRACT
  Filled 2021-08-29: qty 28

## 2021-08-29 MED ORDER — BUDESONIDE 0.25 MG/2ML IN SUSP
0.2500 mg | Freq: Two times a day (BID) | RESPIRATORY_TRACT | Status: DC
  Administered 2021-08-29 – 2021-08-31 (×4): 0.25 mg via RESPIRATORY_TRACT
  Filled 2021-08-29 (×4): qty 2

## 2021-08-29 MED ORDER — LABETALOL HCL 100 MG PO TABS
50.0000 mg | ORAL_TABLET | Freq: Two times a day (BID) | ORAL | Status: DC
  Administered 2021-08-30 – 2021-08-31 (×4): 50 mg via ORAL
  Filled 2021-08-29 (×5): qty 0.5

## 2021-08-29 MED ORDER — UMECLIDINIUM BROMIDE 62.5 MCG/ACT IN AEPB
1.0000 | INHALATION_SPRAY | Freq: Every day | RESPIRATORY_TRACT | Status: DC
Start: 2021-08-29 — End: 2021-08-29
  Administered 2021-08-29: 1 via RESPIRATORY_TRACT
  Filled 2021-08-29: qty 7

## 2021-08-29 MED ORDER — IPRATROPIUM-ALBUTEROL 0.5-2.5 (3) MG/3ML IN SOLN
3.0000 mL | Freq: Four times a day (QID) | RESPIRATORY_TRACT | Status: DC
  Administered 2021-08-29 – 2021-08-30 (×3): 3 mL via RESPIRATORY_TRACT
  Filled 2021-08-29 (×3): qty 3

## 2021-08-29 NOTE — Progress Notes (Signed)
PHARMACY - PHYSICIAN COMMUNICATION CRITICAL VALUE ALERT - BLOOD CULTURE IDENTIFICATION (BCID)  Holly [name] is an 75 y.o. female who presented to [facility] on 08/28/2021 with a chief complaint of SOB.  Assessment:  Started on ABX for SIRS, now w/ E.coli growing in 4 of 4 blood cx bottles.  Name of physician (or Provider) Contacted: E.Chen DO  Current antibiotics: Rocephin and azithromycin  Changes to prescribed antibiotics recommended:  Recommendations accepted by provider -- narrow to Rocephin.  Results for orders placed or performed during the hospital encounter of 08/28/21  Blood Culture ID Panel (Reflexed) (Collected: 08/28/2021  5:36 PM)  Result Value Ref Range   Enterococcus faecalis NOT DETECTED NOT DETECTED   Enterococcus Faecium NOT DETECTED NOT DETECTED   Listeria monocytogenes NOT DETECTED NOT DETECTED   Staphylococcus species NOT DETECTED NOT DETECTED   Staphylococcus aureus (BCID) NOT DETECTED NOT DETECTED   Staphylococcus epidermidis NOT DETECTED NOT DETECTED   Staphylococcus lugdunensis NOT DETECTED NOT DETECTED   Streptococcus species NOT DETECTED NOT DETECTED   Streptococcus agalactiae NOT DETECTED NOT DETECTED   Streptococcus pneumoniae NOT DETECTED NOT DETECTED   Streptococcus pyogenes NOT DETECTED NOT DETECTED   A.calcoaceticus-baumannii NOT DETECTED NOT DETECTED   Bacteroides fragilis NOT DETECTED NOT DETECTED   Enterobacterales DETECTED (A) NOT DETECTED   Enterobacter cloacae complex NOT DETECTED NOT DETECTED   Escherichia coli DETECTED (A) NOT DETECTED   Klebsiella aerogenes NOT DETECTED NOT DETECTED   Klebsiella oxytoca NOT DETECTED NOT DETECTED   Klebsiella pneumoniae NOT DETECTED NOT DETECTED   Proteus species NOT DETECTED NOT DETECTED   Salmonella species NOT DETECTED NOT DETECTED   Serratia marcescens NOT DETECTED NOT DETECTED   Haemophilus influenzae NOT DETECTED NOT DETECTED   Neisseria meningitidis NOT DETECTED NOT DETECTED   Pseudomonas  aeruginosa NOT DETECTED NOT DETECTED   Stenotrophomonas maltophilia NOT DETECTED NOT DETECTED   Candida albicans NOT DETECTED NOT DETECTED   Candida auris NOT DETECTED NOT DETECTED   Candida glabrata NOT DETECTED NOT DETECTED   Candida krusei NOT DETECTED NOT DETECTED   Candida parapsilosis NOT DETECTED NOT DETECTED   Candida tropicalis NOT DETECTED NOT DETECTED   Cryptococcus neoformans/gattii NOT DETECTED NOT DETECTED   CTX-M ESBL NOT DETECTED NOT DETECTED   Carbapenem resistance IMP NOT DETECTED NOT DETECTED   Carbapenem resistance KPC NOT DETECTED NOT DETECTED   Carbapenem resistance NDM NOT DETECTED NOT DETECTED   Carbapenem resist OXA 48 LIKE NOT DETECTED NOT DETECTED   Carbapenem resistance VIM NOT DETECTED NOT DETECTED    [name], PharmD, BCPS  08/29/2021  6:26 AM

## 2021-08-29 NOTE — Progress Notes (Signed)
RT removed pt from bipap. Pt refusing to continue to wear bipap with RT X2 at bedside. Pt placed on 3L [place]. RT will continue to monitor. RN aware.

## 2021-08-29 NOTE — Consult Note (Signed)
Palliative Care Consult Note                                  Date: 08/29/2021   Patient Name: Holly [name]  DOB: 1946/10/06  MRN: [id number]  Age / Sex: 75 y.o., female  PCP: Harlan Stains, MD Referring Physician: Oswald Hillock, MD  Reason for Consultation: Establishing goals of care  HPI/Patient Profile: Palliative Care consult requested for goals of care discussion in this 75 y.o. 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  She was admitted on 08/28/2021 with shortness of breath.  Patient was recently hospitalized receiving treatment for acute on chronic respiratory failure requiring intubation.  Past Medical History:  Diagnosis Date   Anxiety    Asthma    Basal cell carcinoma    lung cancer.  Skin cancer- basal - nose removed   Bruises easily    Cataract    Cervicalgia    COPD (chronic obstructive pulmonary disease) (HCC)    emphysema and COPD   Dyspnea    uses O2 only when needed. Uses 2 L    Foot swelling    left- has shown to Drs.   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  Ms.  Crigler is awake, alert and oriented.  Currently having ABG drawn.  Denies pain otherwise.  Able to follow commands.   Concept of Palliative Care was introduced as specialized medical care for people and their families living with serious illness.  It focuses on providing relief from the symptoms and stress of a serious illness.  The goal is to improve quality of life for both the patient and the family. Values and goals of care important to patient and family were attempted to be elicited.  Patient and family verbalized understanding and appreciation.  I created space and opportunity for patient and family to explore state of health prior to admission, thoughts, and feelings.  Patient lives in the home with her daughter.  Prior to admission she was ambulatory with walker assist due to gait instability.  Uses a bedside commode most recently due to joint pain related to gout.  Her appetite has been minimal despite the use of mirtazapine.  She is tolerating 1-2 [employer] instant breakfast/boost daily per daughter.  She is on home oxygen 3 L in addition to CPAP for support.  Endorses insomnia.  We discussed Her current illness and what it means in the larger context of Her on-going co-morbidities. Natural disease trajectory and expectations were discussed.  Patient and daughter are remaining hopeful for some improvement/stability.  They would like to continue to treat the treatable allow patient every opportunity to continue to thrive.  Ms. Wherley is expressing concerns for her pet scan scheduled for next week as she would like to have this done.  Advised we will not discontinue or reschedule until closer to date to better gauge if she will remain hospitalized at that time or not.  Patient and daughter verbalized understanding and appreciation.  I discussed the importance of continued conversation with family and their medical providers regarding overall plan of care and treatment options, ensuring decisions  are within the context of the patients values and GOCs.  Questions and concerns were addressed.  Patient and family was encouraged to call with questions or concerns.  PMT will continue to support holistically as needed.  Life Review: Patient lives in the home with her daughter.  She also has a son who lives in [place].   Objective:   Primary Diagnoses: Present on Admission:  Acute on chronic respiratory failure with hypoxia and hypercapnia (HCC)  Anxiety state  Anemia due to chronic kidney disease  COPD exacerbation (HCC)  Primary malignant neoplasm of left upper lobe of lung (HCC)  Acute metabolic encephalopathy  GERD (gastroesophageal reflux disease)  DNR (do not resuscitate)  SIRS (systemic inflammatory response syndrome) (HCC)  E coli bacteremia   Scheduled Meds:  atorvastatin  20 mg Oral QHS   budesonide (PULMICORT) nebulizer solution  0.25 mg Nebulization BID   buPROPion  300 mg Oral q morning   busPIRone  15 mg Oral BID   docusate sodium  100 mg Oral Daily   donepezil  10 mg Oral QHS   enoxaparin (LOVENOX) injection  30 mg Subcutaneous Q24H   guaiFENesin  600 mg Oral BID   ipratropium-albuterol  3 mL Nebulization Q6H   levothyroxine  137 mcg Oral Q0600   methylPREDNISolone (SOLU-MEDROL) injection  80 mg Intravenous Q24H   mirtazapine  15 mg Oral QHS   pantoprazole  40 mg Oral Daily   potassium chloride  10 mEq Oral BID WC   sodium chloride flush  3 mL Intravenous Q12H    Continuous Infusions:  cefTRIAXone (ROCEPHIN)  IV 2 g (08/28/21 1744)    PRN Meds: acetaminophen **OR** acetaminophen, albuterol, LORazepam, traMADol  Allergies  Allergen Reactions   Amlodipine Besylate Swelling    Edema   Codeine Nausea And Vomiting   Oxycodone-Acetaminophen Nausea And Vomiting    Review of Systems  Constitutional:  Positive for appetite change.  Respiratory:  Positive for shortness of breath.   Neurological:  Positive for weakness.  Unless otherwise noted, a  complete review of systems is negative.  Physical Exam General: NAD Cardiovascular: regular rate and rhythm Pulmonary: Crackles Abdomen: soft, nontender, + bowel sounds Extremities: no edema, no joint deformities Skin: no rashes, warm and dry Neurological: AAOx3, mood appropriate  Vital Signs:  BP 123/90 (BP Location: Left Arm)   Pulse 89   Temp 98.4 F (36.9 C) (Oral)   Resp 17   Wt 65.6 kg   SpO2 94%   BMI 24.82 kg/m  Pain Scale: 0-10   Pain Score: 8   SpO2: SpO2: 94 % O2 Device:SpO2: 94 % O2 Flow Rate: .O2 Flow Rate (L/min): 3 L/min  IO: Intake/output summary:  Intake/Output Summary (Last 24 hours) at 08/29/2021 1503 Last data filed at 08/29/2021 0800 Gross per 24 hour  Intake 1093.3 ml  Output 750 ml  Net 343.3 ml  LBM: Last BM Date: 08/27/21 Baseline Weight: Weight: 65.4 kg Most recent weight: Weight: 65.6 kg      Palliative Assessment/Data: PPS 30%   Advanced Care Planning:   Primary Decision Maker: HCPOA-[name] McDuffie (daughter)  Code Status/Advance Care Planning: DNR  A discussion was had today regarding advanced directives.  Patient has completed MOST form on file.  I personally reviewed documents with no require changes.  Cardiopulmonary Resuscitation: Do Not Attempt Resuscitation (DNR/No CPR)  Medical Interventions: Limited Additional Interventions: Use medical treatment, IV fluids and cardiac monitoring as indicated, DO NOT USE intubation or mechanical ventilation. May consider use of less invasive airway support such as BiPAP or CPAP. Also provide comfort measures. Transfer to the hospital if indicated. Avoid intensive care.   Antibiotics: Antibiotics if indicated  IV Fluids: IV fluids if indicated  Feeding Tube: No feeding tube     Patient is aware of ongoing palliative support.  I also provided education on continued support outpatient at [facility].  Patient and daughter verbalized understanding and appreciation with  requests for outpatient follow-up post discharge.   Assessment & Plan:   SUMMARY OF RECOMMENDATIONS   DNR/DNI-as confirmed by patient Continue with current plan of care.  Patient and daughter hopeful to continue to treat the treatable allow her every opportunity to continue to thrive. Ongoing goals of care discussions encouraged. PMT will continue to support and follow as needed. Please call team line with urgent needs.  Palliative Prophylaxis:  Frequent Pain Assessment  Additional Recommendations (Limitations, Scope, Preferences): No Artificial Feeding and DNR/DNI, treat the treatable  Psycho-social/Spiritual:  Desire for further Chaplaincy support: no Additional Recommendations:  Ongoing goals of care discussions  Prognosis:  Guarded  Discharge Planning:  To Be Determined expressed goals to return home with her daughter and family support.   Patient and daughter [name] expressed understanding and was in agreement with this plan.   Time Total: 55 min.   Visit consisted of counseling and education dealing with the complex and emotionally intense issues of symptom management and palliative care in the setting of serious and potentially life-threatening illness.Greater than 50%  of this time was spent counseling and coordinating care related to the above assessment and plan.  Signed by:  Alda Lea, AGPCNP-BC Palliative Medicine Team  Phone: [phone] Pager: [phone] Amion: Bjorn Pippin   Thank you for allowing the Palliative Medicine Team to assist in the care of this patient. Please utilize secure chat with additional questions, if there is no response within 30 minutes please call the above phone number. Palliative Medicine Team providers are available by phone from 7am to 5pm daily and can be reached through the team cell phone.  Should this patient require assistance outside of these hours, please call the patient's attending  physician.

## 2021-08-29 NOTE — Progress Notes (Signed)
Pt is in BIPAP on/off throughout the day, right now in oxygen 3 l via [employer], was able to sit in a recliner for couple of hours in afternoon, daughter is aware of the situation. Pt does not like to be in BIPAP for a long time, RT is working with pt very closely, will continue to monitor the patient  Palma Holter, Therapist, sports

## 2021-08-29 NOTE — Progress Notes (Signed)
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  She was recently hospitalized from 10/11-10/17 with acute on chronic hypoxic and hypercapnic respiratory failure requiring intubation.  She was referred to Dr. Lisbeth Renshaw of radiation oncology inpatient at [facility] to have PET scan on 17th of this month to evaluate new right lung mass found during previous hospitalization.  Patient was short of breath at home and also was febrile with temperature 101.9 F 2 days ago.  Patient is a CPAP at home. In the ED she was found to have worsening PCO2 81 mmHg on ABG requiring BiPAP.   Subjective   Patient seen and examined, she is more alert.  Repeat ABG this morning showed PCO2 of 63.9.   Assessment/Plan:   Acute on chronic respiratory failure with hypoxia and hypercapnia -Secondary to COPD exacerbation -Chest x-ray showed no acute abnormality but showed large right middle lobe nodule unchanged from recent CT from 10/8 concerning for neoplasm -Initial ABG showed PCO2 of 81, patient placed on BiPAP -Continue Solu-Medrol 40 mg IV every 12 hours -We will start DuoNeb nebulizers every 6 hours -Pulmicort nebulizer twice daily  Acute metabolic encephalopathy -Likely combination of acute hypercapnic respiratory failure along with sepsis -Resolved, mental status at baseline  Sepsis/E. coli bacteremia -Sepsis physiology has resolved -Patient has abnormal UA -Blood culture growing E. 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 Lisbeth Renshaw as outpatient  Chronic diastolic CHF -Last EF noted to be 65 to 70% with grade 1 diastolic dysfunction by echocardiogram -Chest x-ray shows no signs of fluid overload -Lasix on hold due to acute kidney injury and hypotension due to sepsis as above  Anemia of chronic disease -Hemoglobin is down to 7.5, her baseline hemoglobin around 8 -EGD on 02/17/2021 showed nonbleeding ulcer  -GI was consulted during previous admission however daughter did not want EGD or colonoscopy at that time -We will keep a close watch on hemoglobin and consult GI as needed  Hypothyroidism -Last TSH 0.148 on 07/24/2021 -Continue Synthroid  Scheduled medications:    albuterol  2.5 mg Nebulization QID   atorvastatin  20 mg Oral QHS   buPROPion  300 mg Oral q morning   busPIRone  15 mg Oral BID   docusate sodium  100 mg Oral Daily   donepezil  10 mg Oral QHS   enoxaparin (LOVENOX) injection  30 mg Subcutaneous Q24H   fluticasone furoate-vilanterol  1 puff Inhalation Daily   And  umeclidinium bromide  1 puff Inhalation Daily   guaiFENesin  600 mg Oral BID   levothyroxine  137 mcg Oral Q0600   methylPREDNISolone (SOLU-MEDROL) injection  80 mg Intravenous Q24H   mirtazapine  15 mg Oral QHS   pantoprazole  40 mg Oral Daily   potassium chloride  10 mEq Oral BID WC   sodium  chloride flush  3 mL Intravenous Q12H     Data Reviewed:   CBG:  No results for input(s): GLUCAP in the last 168 hours.  SpO2: 96 % O2 Flow Rate (L/min): 3 L/min FiO2 (%): 40 %    Vitals:   08/29/21 0358 08/29/21 0717 08/29/21 0738 08/29/21 1125  BP: 126/70 123/62 123/62 123/90  Pulse: 71 77 80 81  Resp: 20 20 16 17   Temp: 97.8 F (36.6 C) 98.3 F (36.8 C) 98.3 F (36.8 C) 98.4 F (36.9 C)  TempSrc: Axillary Oral Oral Oral  SpO2: 98% 90% 90% 96%  Weight: 65.6 kg        Intake/Output Summary (Last 24 hours) at 08/29/2021 1319 Last data filed at 08/29/2021 0800 Gross per 24 hour  Intake 1093.3 ml  Output 750 ml  Net 343.3 ml    11/11 1901 - 11/13 0700 In: 973.3 [P.O.:120; I.V.:3] Out: 450 [Urine:450]  Filed Weights   08/28/21 1400 08/29/21 0358  Weight: 65.4 kg 65.6 kg    Data Reviewed: Basic Metabolic Panel: Recent Labs  Lab 08/28/21 0929 08/28/21 1213 08/29/21 0117  NA 137 138 136  K 3.8 3.8 4.8  CL 92*  --  91*  CO2 39*  --  37*  GLUCOSE 109*  --  136*  BUN 27*  --  30*  CREATININE 1.51*  --  1.46*  CALCIUM 9.3  --  8.9   Liver Function Tests: Recent Labs  Lab 08/28/21 1127  AST 16  ALT 13  ALKPHOS 130*  BILITOT 0.8  PROT 5.9*  ALBUMIN 2.1*   No results for input(s): LIPASE, AMYLASE in the last 168 hours. Recent Labs  Lab 08/28/21 1134  AMMONIA <10   CBC: Recent Labs  Lab 08/28/21 0929 08/28/21 1213 08/29/21 0117  WBC 13.1*  --  12.8*  HGB 8.0* 9.2* 7.5*  HCT 28.1* 27.0* 26.1*  MCV 99.3  --  97.8  PLT 200  --  170   Cardiac Enzymes: No results for input(s): CKTOTAL, CKMB, CKMBINDEX, TROPONINI in the last 168 hours. BNP (last 3 results) Recent Labs    03/22/21 2325 07/23/21 2202 08/28/21 1133  BNP 48.0 147.2* 232.5*    ProBNP (last 3 results) No results for input(s): PROBNP in the last 8760 hours.  CBG: No results for input(s): GLUCAP in the last 168 hours.     Radiology Reports  DG Chest 2 View  Result  Date: 08/28/2021 CLINICAL DATA:  Holly [name] , a 75 y.o. female was evaluated in triage. Pt complains of increased SOB since Thursday. Endorses slightly elevated temperature, cough, fatigue, confusion. Desaturated to 83% on home 3L, improved with nonrebreath.*comment was truncated*SOB EXAM: CHEST - 2 VIEW COMPARISON:  07/24/2021, chest radiograph and CT FINDINGS: Normal cardiac silhouette. Again demonstrated 2 cm nodule in the RIGHT middle lobe. No effusion, infiltrate pneumothorax. No acute osseous abnormality. IMPRESSION: 1.  No acute cardiopulmonary process. 2. Large RIGHT middle lobe nodule unchanged from recent CT. Nodule described as suspicious for pulmonary neoplasm on CT 07/24/2021. Electronically Signed   By: Suzy Bouchard M.D.   On: 08/28/2021 10:11  Antibiotics: Anti-infectives (From admission, onward)    Start     Dose/Rate Route Frequency Ordered Stop   08/28/21 1800  cefTRIAXone (ROCEPHIN) 2 g in sodium chloride 0.9 % 100 mL IVPB        2 g 200 mL/hr over 30 Minutes Intravenous Every 24 hours 08/28/21 1703     08/28/21 1800  azithromycin (ZITHROMAX) 500 mg in sodium chloride 0.9 % 250 mL IVPB  Status:  Discontinued        500 mg 250 mL/hr over 60 Minutes Intravenous Every 24 hours 08/28/21 1703 08/29/21 0628         DVT prophylaxis: Lovenox  Code Status: DNR  Family Communication: Discussed with patient's daughter on phone   Consultants:   Procedures:     Objective    Physical Examination:   General-appears in no acute distress Heart-S1-S2, regular, no murmur auscultated Lungs-bibasilar crackles Abdomen-soft, nontender, no organomegaly Extremities-no edema in the lower extremities Neuro-alert, oriented x3, no focal deficit noted  Status is: Inpatient  Dispo: The patient is from: Home              Anticipated d/c is to: Home              Anticipated d/c date is: 09/02/2021              Patient currently not stable for discharge  Barrier  to discharge-follow Dr. Darrick Meigs Home  COVID-19 Labs  No results for input(s): DDIMER, FERRITIN, LDH, CRP in the last 72 hours.  Lab Results  Component Value Date   SARSCOV2NAA NEGATIVE 08/28/2021   [place] NEGATIVE 07/23/2021   [place] NEGATIVE 03/22/2021   [place] NEGATIVE 02/22/2021     Pressure Injury 02/16/21 Buttocks Right Stage 2 -  Partial thickness loss of dermis presenting as a shallow open injury with a red, pink wound bed without slough. (Active)  02/16/21 0230  Location: Buttocks  Location Orientation: Right  Staging: Stage 2 -  Partial thickness loss of dermis presenting as a shallow open injury with a red, pink wound bed without slough.  Wound Description (Comments):   Present on Admission: Yes        Recent Results (from the past 240 hour(s))  Resp Panel by RT-PCR (Flu A&B, Covid) Nasopharyngeal Swab     Status: None   Collection Time: 08/28/21  9:28 AM   Specimen: Nasopharyngeal Swab; Nasopharyngeal(NP) swabs in vial transport medium  Result Value Ref Range Status   SARS Coronavirus 2 by RT PCR NEGATIVE NEGATIVE Final    Comment: (NOTE) SARS-CoV-2 target nucleic acids are NOT DETECTED.  The SARS-CoV-2 RNA is generally detectable in upper respiratory specimens during the acute phase of infection. The lowest concentration of SARS-CoV-2 viral copies this assay can detect is 138 copies/mL. A negative result does not preclude SARS-Cov-2 infection and should not be used as the sole basis for treatment or other patient management decisions. A negative result may occur with  improper specimen collection/handling, submission of specimen other than nasopharyngeal swab, presence of viral mutation(s) within the areas targeted by this assay, and inadequate number of viral copies(<138 copies/mL). A negative result must be combined with clinical observations, patient history, and epidemiological information. The expected result is Negative.  Fact Sheet for  Patients:  EntrepreneurPulse.com.au  Fact Sheet for Healthcare Providers:  IncredibleEmployment.be  This test is no t yet approved or cleared by the [place] FDA and  has been authorized for detection and/or diagnosis of SARS-CoV-2 by FDA under an  Emergency Use Authorization (EUA). This EUA will remain  in effect (meaning this test can be used) for the duration of the COVID-19 declaration under Section 564(b)(1) of the Act, 21 U.S.C.section 360bbb-3(b)(1), unless the authorization is terminated  or revoked sooner.       Influenza A by PCR NEGATIVE NEGATIVE Final   Influenza B by PCR NEGATIVE NEGATIVE Final    Comment: (NOTE) The Xpert Xpress SARS-CoV-2/FLU/RSV plus assay is intended as an aid in the diagnosis of influenza from Nasopharyngeal swab specimens and should not be used as a sole basis for treatment. Nasal washings and aspirates are unacceptable for Xpert Xpress SARS-CoV-2/FLU/RSV testing.  Fact Sheet for Patients: EntrepreneurPulse.com.au  Fact Sheet for Healthcare Providers: IncredibleEmployment.be  This test is not yet approved or cleared by the [place] FDA and has been authorized for detection and/or diagnosis of SARS-CoV-2 by FDA under an Emergency Use Authorization (EUA). This EUA will remain in effect (meaning this test can be used) for the duration of the COVID-19 declaration under Section 564(b)(1) of the Act, 21 U.S.C. section 360bbb-3(b)(1), unless the authorization is terminated or revoked.  Performed at [facility], [place] [address], [place], [place] [address]   Culture, blood (routine x 2)     Status: None (Preliminary result)   Collection Time: 08/28/21  5:33 PM   Specimen: BLOOD  Result Value Ref Range Status   Specimen Description BLOOD LEFT ANTECUBITAL  Final   Special Requests   Final    BOTTLES DRAWN AEROBIC AND ANAEROBIC Blood Culture adequate volume    Culture  Setup Time   Final    GRAM NEGATIVE RODS IN BOTH AEROBIC AND ANAEROBIC BOTTLES CRITICAL VALUE NOTED.  VALUE IS CONSISTENT WITH PREVIOUSLY REPORTED AND CALLED VALUE.    Culture   Final    NO GROWTH < 24 HOURS Performed at [facility], [place] [address], [place], [place] [address]    Report Status PENDING  Incomplete  Culture, blood (routine x 2)     Status: None (Preliminary result)   Collection Time: 08/28/21  5:36 PM   Specimen: BLOOD RIGHT HAND  Result Value Ref Range Status   Specimen Description BLOOD RIGHT HAND  Final   Special Requests   Final    BOTTLES DRAWN AEROBIC AND ANAEROBIC Blood Culture adequate volume   Culture  Setup Time   Final    GRAM NEGATIVE RODS IN BOTH AEROBIC AND ANAEROBIC BOTTLES CRITICAL RESULT CALLED TO, READ BACK BY AND VERIFIED WITH: V BRYK,[email or web address]  08/29/21 [place] Performed at [facility], [place] [address] [place], [place] [address]    Culture GRAM NEGATIVE RODS  Final   Report Status PENDING  Incomplete  Blood Culture ID Panel (Reflexed)     Status: Abnormal   Collection Time: 08/28/21  5:36 PM  Result Value Ref Range Status   Enterococcus faecalis NOT DETECTED NOT DETECTED Final   Enterococcus Faecium NOT DETECTED NOT DETECTED Final   Listeria monocytogenes NOT DETECTED NOT DETECTED Final   Staphylococcus species NOT DETECTED NOT DETECTED Final   Staphylococcus aureus (BCID) NOT DETECTED NOT DETECTED Final   Staphylococcus epidermidis NOT DETECTED NOT DETECTED Final   Staphylococcus lugdunensis NOT DETECTED NOT DETECTED Final   Streptococcus species NOT DETECTED NOT DETECTED Final   Streptococcus agalactiae NOT DETECTED NOT DETECTED Final   Streptococcus pneumoniae NOT DETECTED NOT DETECTED Final   Streptococcus pyogenes NOT DETECTED NOT DETECTED Final   A.calcoaceticus-baumannii NOT DETECTED NOT DETECTED Final  Bacteroides fragilis NOT DETECTED NOT DETECTED Final   Enterobacterales DETECTED (A) NOT DETECTED Final     Comment: Enterobacterales represent a large order of gram negative bacteria, not a single organism. CRITICAL RESULT CALLED TO, READ BACK BY AND VERIFIED WITH: V BYRK,[email or web address]  08/29/21 [place]    Enterobacter cloacae complex NOT DETECTED NOT DETECTED Final   Escherichia coli DETECTED (A) NOT DETECTED Final    Comment: CRITICAL RESULT CALLED TO, READ BACK BY AND VERIFIED WITH: V BRYK,[email or web address]  08/29/21 [place]    Klebsiella aerogenes NOT DETECTED NOT DETECTED Final   Klebsiella oxytoca NOT DETECTED NOT DETECTED Final   Klebsiella pneumoniae NOT DETECTED NOT DETECTED Final   Proteus species NOT DETECTED NOT DETECTED Final   Salmonella species NOT DETECTED NOT DETECTED Final   Serratia marcescens NOT DETECTED NOT DETECTED Final   Haemophilus influenzae NOT DETECTED NOT DETECTED Final   Neisseria meningitidis NOT DETECTED NOT DETECTED Final   Pseudomonas aeruginosa NOT DETECTED NOT DETECTED Final   Stenotrophomonas maltophilia NOT DETECTED NOT DETECTED Final   Candida albicans NOT DETECTED NOT DETECTED Final   Candida auris NOT DETECTED NOT DETECTED Final   Candida glabrata NOT DETECTED NOT DETECTED Final   Candida krusei NOT DETECTED NOT DETECTED Final   Candida parapsilosis NOT DETECTED NOT DETECTED Final   Candida tropicalis NOT DETECTED NOT DETECTED Final   Cryptococcus neoformans/gattii NOT DETECTED NOT DETECTED Final   CTX-M ESBL NOT DETECTED NOT DETECTED Final   Carbapenem resistance IMP NOT DETECTED NOT DETECTED Final   Carbapenem resistance KPC NOT DETECTED NOT DETECTED Final   Carbapenem resistance NDM NOT DETECTED NOT DETECTED Final   Carbapenem resist OXA 48 LIKE NOT DETECTED NOT DETECTED Final   Carbapenem resistance VIM NOT DETECTED NOT DETECTED Final    Comment: Performed at [facility], [address] [place],  [address]    [facility] If 7PM-7AM, please contact night-coverage at [email or web address], Office  [phone]   08/29/2021,  1:19 PM  LOS: 1 day

## 2021-08-30 ENCOUNTER — Ambulatory Visit (HOSPITAL_COMMUNITY): Payer: Medicare Other

## 2021-08-30 ENCOUNTER — Inpatient Hospital Stay (HOSPITAL_COMMUNITY): Payer: Medicare Other

## 2021-08-30 ENCOUNTER — Ambulatory Visit: Payer: Medicare Other

## 2021-08-30 LAB — CBC
HCT: 26 % — ABNORMAL LOW (ref 36.0–46.0)
Hemoglobin: 7.6 g/dL — ABNORMAL LOW (ref 12.0–15.0)
MCH: 28.4 pg (ref 26.0–34.0)
MCHC: 29.2 g/dL — ABNORMAL LOW (ref 30.0–36.0)
MCV: 97 fL (ref 80.0–100.0)
Platelets: 189 10*3/uL (ref 150–400)
RBC: 2.68 MIL/uL — ABNORMAL LOW (ref 3.87–5.11)
RDW: 16 % — ABNORMAL HIGH (ref 11.5–15.5)
WBC: 11.3 10*3/uL — ABNORMAL HIGH (ref 4.0–10.5)
nRBC: 0 % (ref 0.0–0.2)

## 2021-08-30 LAB — COMPREHENSIVE METABOLIC PANEL
ALT: 12 U/L (ref 0–44)
AST: 11 U/L — ABNORMAL LOW (ref 15–41)
Albumin: 1.8 g/dL — ABNORMAL LOW (ref 3.5–5.0)
Alkaline Phosphatase: 84 U/L (ref 38–126)
Anion gap: 6 (ref 5–15)
BUN: 27 mg/dL — ABNORMAL HIGH (ref 8–23)
CO2: 37 mmol/L — ABNORMAL HIGH (ref 22–32)
Calcium: 9 mg/dL (ref 8.9–10.3)
Chloride: 95 mmol/L — ABNORMAL LOW (ref 98–111)
Creatinine, Ser: 1.19 mg/dL — ABNORMAL HIGH (ref 0.44–1.00)
GFR, Estimated: 47 mL/min — ABNORMAL LOW (ref >60)
Glucose, Bld: 151 mg/dL — ABNORMAL HIGH (ref 70–99)
Potassium: 4.6 mmol/L (ref 3.5–5.1)
Sodium: 138 mmol/L (ref 135–145)
Total Bilirubin: 0.4 mg/dL (ref 0.3–1.2)
Total Protein: 5.6 g/dL — ABNORMAL LOW (ref 6.5–8.1)

## 2021-08-30 LAB — URINE CULTURE: Culture: <10000 — AB

## 2021-08-30 IMAGING — US US RENAL
1 series · 14 of 25 positions shown · non-contrast
Comparison: CT of the abdomen pelvis on [DATE]

CLINICAL DATA: Acute kidney injury.

EXAM:
RENAL / URINARY TRACT ULTRASOUND COMPLETE

[Series 1: us renal · 14 of 43 slices shown]
[im 1/43]
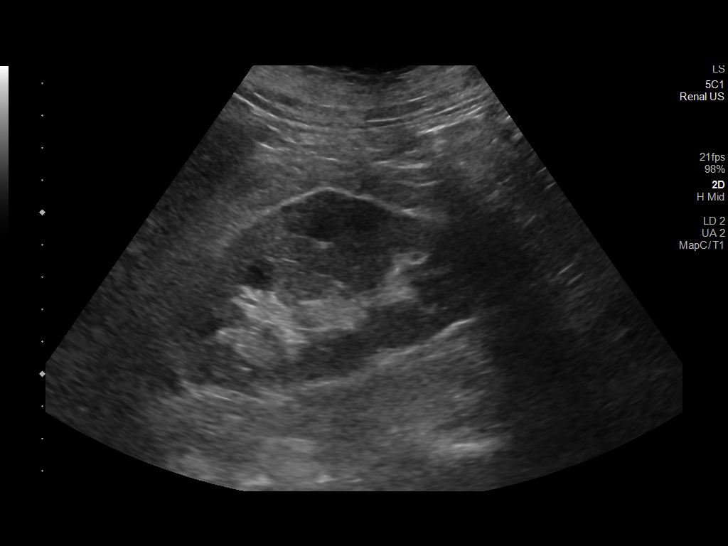
[im 4/43]
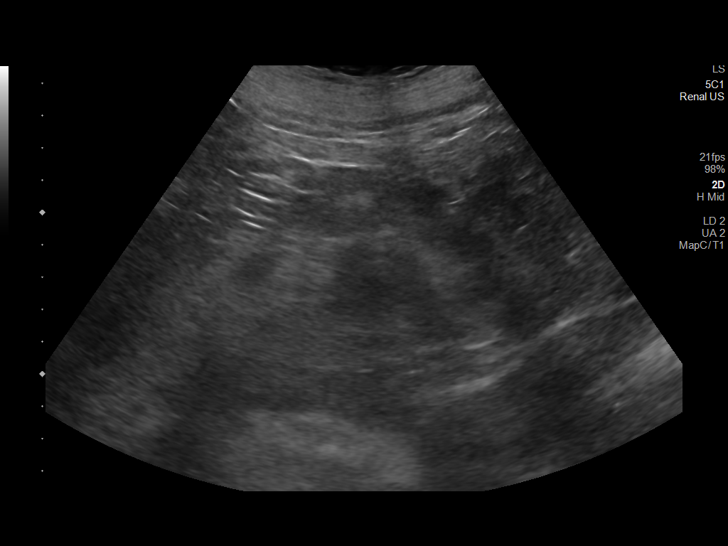
[im 8/43]
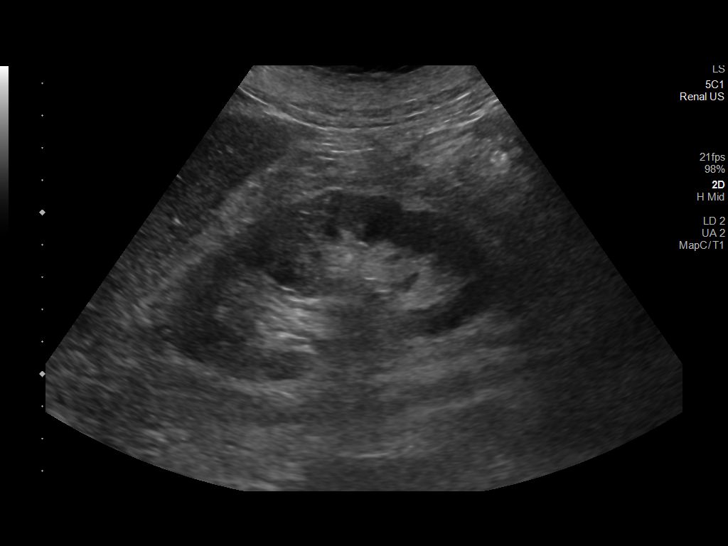
[im 11/43]
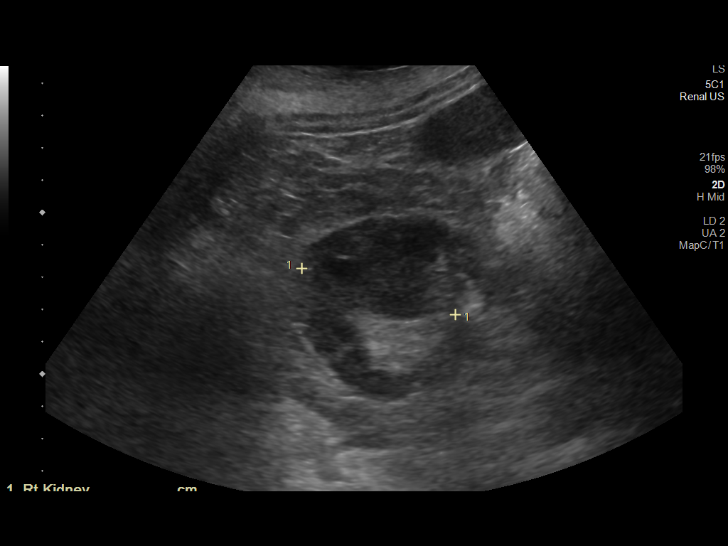
[im 15/43]
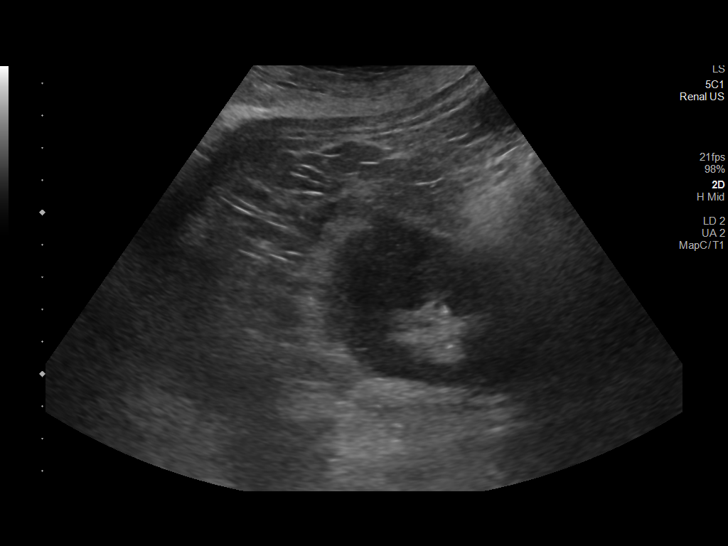
[im 16/43]
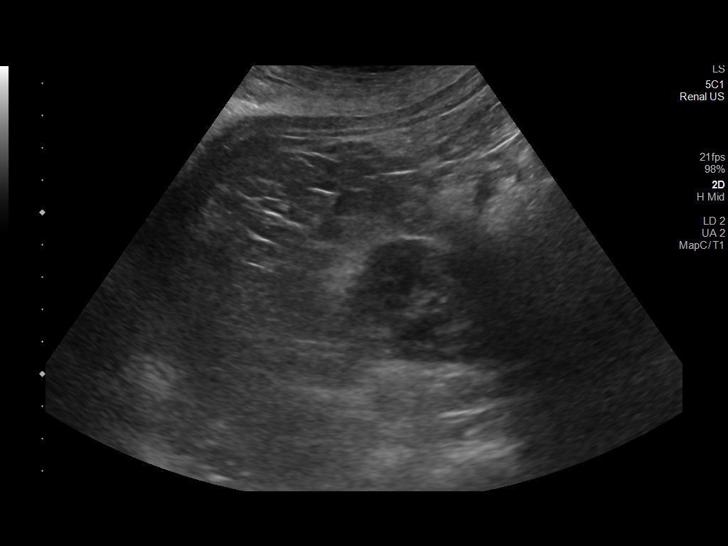
[im 20/43]
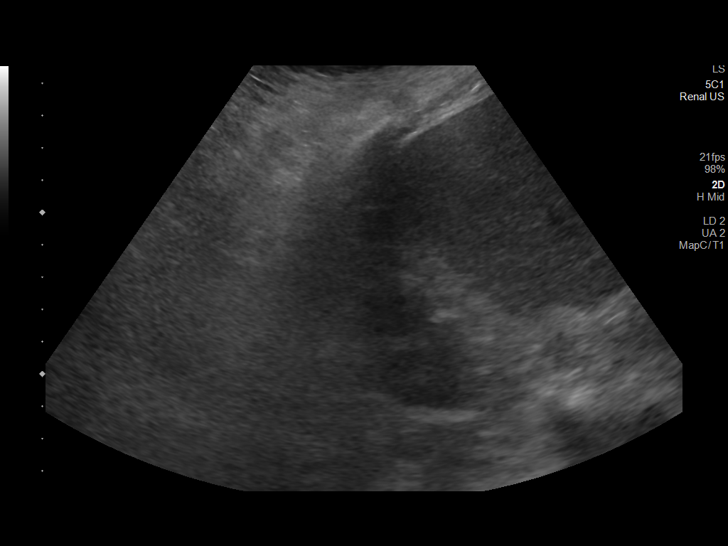
[im 23/43]
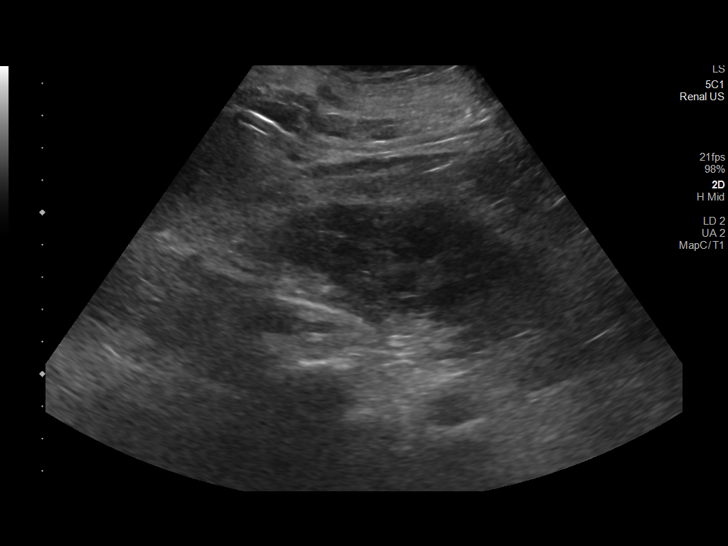
[im 27/43]
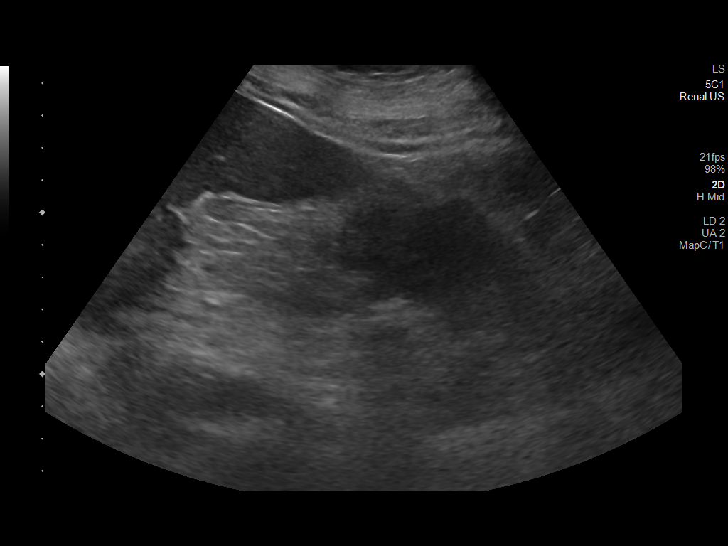
[im 29/43]
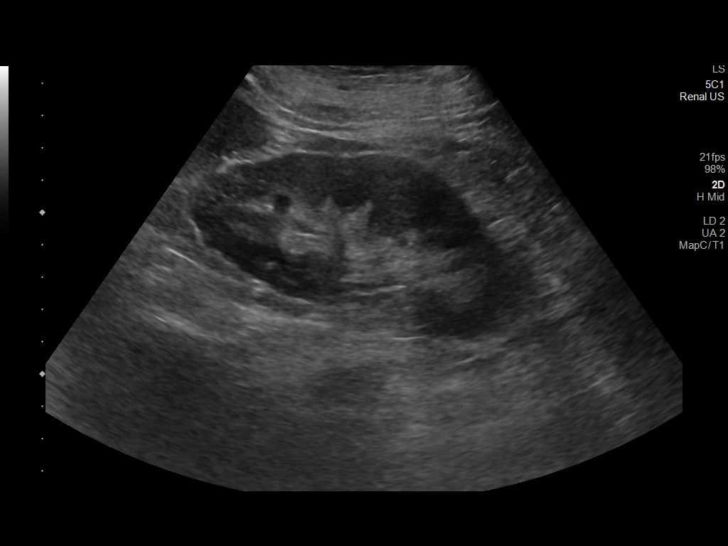
[im 32/43]
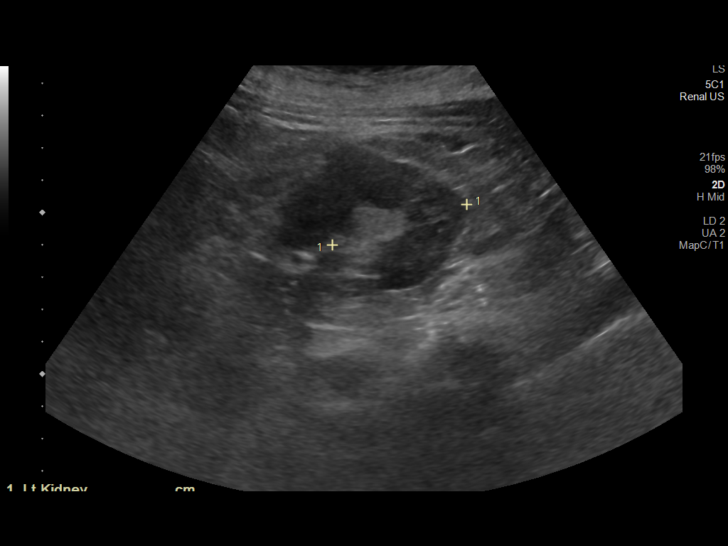
[im 36/43]
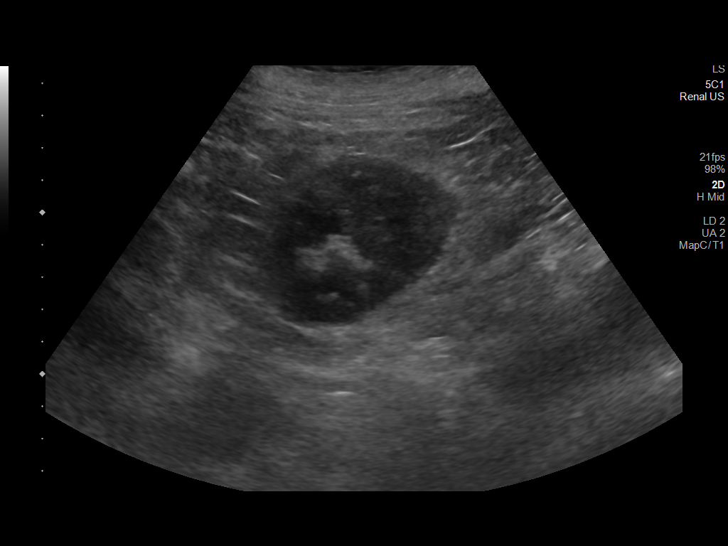
[im 39/43]
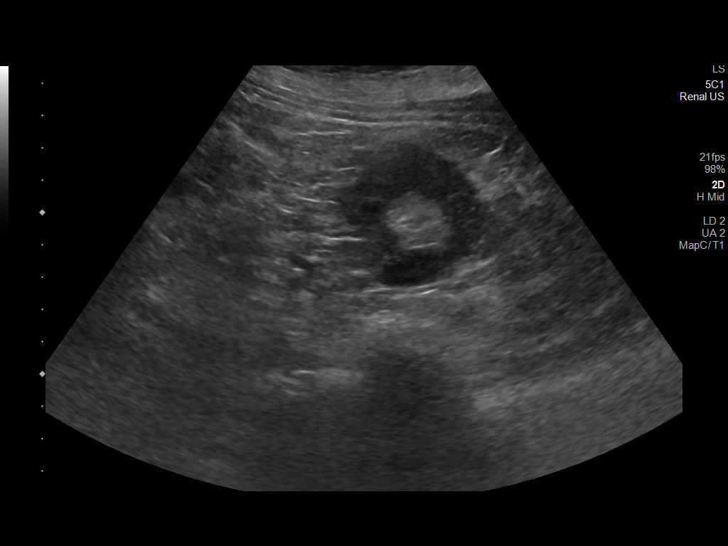
[im 43/43]
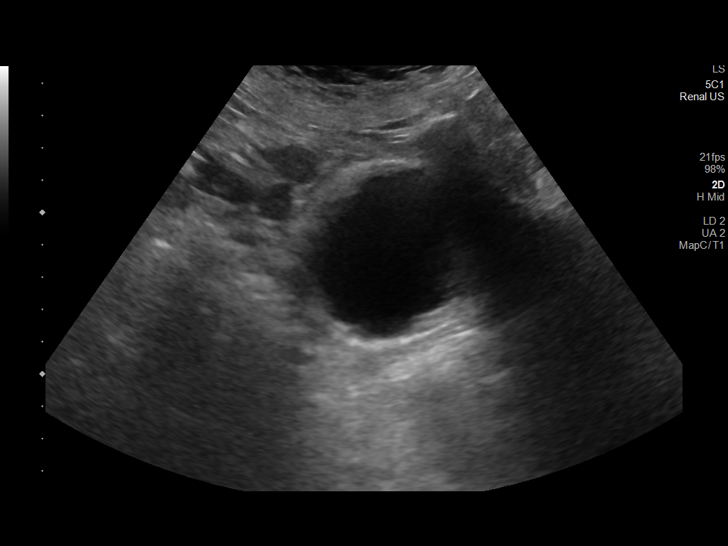

[14 of 25 positions shown; findings below may reference images not displayed]

FINDINGS: Right Kidney:

Renal measurements: 10.3 x 5.2 x 5.0 centimeters = volume: 140.3 mL.
Echogenicity within normal limits. No mass or hydronephrosis
visualized.

Left Kidney:

Renal measurements: 10.1 x 5.1 x 4.3 centimeters = volume:
mL. Echogenicity within normal limits. No mass or hydronephrosis
visualized.

Bladder:

Appears normal for degree of bladder distention.

Other:

None.
IMPRESSION: No evidence for acute  abnormality.

## 2021-08-30 MED ORDER — TRAZODONE HCL 50 MG PO TABS: 50.0000 mg | ORAL_TABLET | Freq: Every evening | ORAL | Status: DC | PRN

## 2021-08-30 MED ORDER — GERHARDT'S BUTT CREAM
TOPICAL_CREAM | CUTANEOUS | Status: DC | PRN
  Filled 2021-08-30: qty 1

## 2021-08-30 NOTE — Progress Notes (Signed)
Daily Progress Note   Patient Name: Holly [name]       Date: 08/30/2021 DOB: 30-Sep-1946  Age: 75 y.o. MRN#: [id number] Attending Physician: Oswald Hillock, MD Primary Care Physician: Harlan Stains, MD Admit Date: 08/28/2021  Reason for Consultation/Follow-up: Establishing goals of care  Subjective: Medical records reviewed. Patient assessed at the bedside. She reports her shortness of breath comes and goes, improving overall. Denies pain or distress. Her friend Holly [name] is present at the bedside.  Created space and opportunity for patient's thoughts and feelings on her current illness. Holly [name] reports feeling at peace with plan to focus on urgent issues such as AKI, acute respiratory failure, and sepsis for the time being, with possibility of rescheduling PET scan if she remains hospitalized. She remains hopeful for continued improvement and discharge home when medically ready.   Questions and concerns addressed PMT will continue to support holistically.  Length of Stay: 2  Current Medications: Scheduled Meds:   atorvastatin  20 mg Oral QHS   budesonide (PULMICORT) nebulizer solution  0.25 mg Nebulization BID   buPROPion  300 mg Oral q morning   busPIRone  15 mg Oral BID   docusate sodium  100 mg Oral Daily   donepezil  10 mg Oral QHS   enoxaparin (LOVENOX) injection  30 mg Subcutaneous Q24H   guaiFENesin  600 mg Oral BID   labetalol  50 mg Oral BID   levothyroxine  137 mcg Oral Q0600   methylPREDNISolone (SOLU-MEDROL) injection  80 mg Intravenous Q24H   mirtazapine  15 mg Oral QHS   pantoprazole  40 mg Oral Daily   potassium chloride  10 mEq Oral BID WC   sodium chloride flush  3 mL Intravenous Q12H    Continuous Infusions:  cefTRIAXone (ROCEPHIN)  IV 2 g (08/29/21 1704)    PRN  Meds: acetaminophen **OR** acetaminophen, albuterol, Gerhardt's butt cream, LORazepam, traMADol  Physical Exam Vitals and nursing note reviewed.  Constitutional:      General: She is not in acute distress.    Appearance: She is ill-appearing.  Cardiovascular:     Rate and Rhythm: Normal rate.  Pulmonary:     Effort: Pulmonary effort is normal.  Neurological:     Mental Status: She is alert. Mental status is at baseline.  Vital Signs: BP 139/65 (BP Location: Left Arm)   Pulse 76   Temp 98.6 F (37 C) (Oral)   Resp (!) 23   Ht 5' (1.524 m)   Wt 63.1 kg   SpO2 100%   BMI 27.17 kg/m  SpO2: SpO2: 100 % O2 Device: O2 Device: Nasal Cannula O2 Flow Rate: O2 Flow Rate (L/min): 3 L/min  Intake/output summary:  Intake/Output Summary (Last 24 hours) at 08/30/2021 1333 Last data filed at 08/30/2021 9371 Gross per 24 hour  Intake 443 ml  Output 1000 ml  Net -557 ml   LBM: Last BM Date: 08/30/21 Baseline Weight: Weight: 65.4 kg Most recent weight: Weight: 63.1 kg       Palliative Assessment/Data: 30%     Patient Active Problem List   Diagnosis Date Noted   E coli bacteremia 08/29/2021   Sepsis secondary to UTI ([place]) 08/29/2021   Acute on chronic respiratory failure with hypoxia and hypercapnia (HCC) 08/28/2021   Anemia due to chronic kidney disease 69/67/8938   Acute metabolic encephalopathy 08/02/5101   GERD (gastroesophageal reflux disease) 08/28/2021   DNR (do not resuscitate) 08/28/2021   SIRS (systemic inflammatory response syndrome) ([place]) 08/28/2021   Acute respiratory failure with hypoxia and hypercapnia (HCC)    Septic shock (HCC)    Goals of care, counseling/discussion    Acute on chronic respiratory failure ([place]) 07/23/2021   Hypotension 02/23/2021   Hypokalemia 02/23/2021   Acute renal failure superimposed on stage 3a chronic kidney disease ([place]) 02/22/2021   GI bleed 02/16/2021   ABLA (acute blood loss anemia) 02/16/2021   Diverticulitis  02/16/2021   Current chronic use of systemic steroids 02/16/2021   Pressure injury of skin 02/16/2021   Olecranon bursitis of right elbow 10/02/2020   Ulna, olecranon process fracture, right, closed, with routine healing, subsequent encounter 10/01/2020   Primary malignant neoplasm of left upper lobe of lung ([place]) 07/09/2019   Closed fracture of right olecranon process 12/04/2017   Cough 11/11/2015   Chronic respiratory failure with hypoxia (HCC) 11/11/2014   Cigarette smoker 10/16/2014   Multinodular thyroid 07/30/2014   Neoplasm of uncertain behavior of thyroid gland, 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  She was admitted on 08/28/2021 with shortness of breath.  Patient was recently hospitalized receiving treatment for acute on chronic respiratory failure requiring intubation.  Assessment: Acute on chronic respiratory failure with hypoxia and hypercapnia, improving COPD exacerbation AKI Sepsis, resolved E. Coli bacteremia  New right middle lobe mass, on palliative radiation Goals of care conversation  Recommendations/Plan: Continue current  care, treating the treatable Patient remains hopeful for improvement of acute illness with goal of returning home to continue palliative radiation, rescheduled PET scan, and maintaining baseline  quality of life Psychosocial and emotional support provided PMT will continue to follow acutely on an as-needed basis. Patient will benefit ongoing goc discussions in outpatient cancer center  Goals of Care and Additional Recommendations: Limitations on Scope of Treatment: No Artificial Feeding and DNR/DNI  Prognosis:  Guarded  Discharge Planning: To Be Determined  Care plan was discussed with Patient  Total time: 25 minutes Greater than 50% of this time was spent in counseling and coordinating care related to the above assessment and plan.  Dorthy Cooler, PA-C Palliative Medicine Team Team phone # [phone]  Thank you for allowing the Palliative Medicine Team to assist in the care of this patient. Please utilize secure chat with additional questions, if there is no response within 30 minutes please call the above phone number.  Palliative Medicine Team providers are available by phone from 7am to 7pm daily and can be reached through the team cell phone.  Should this patient require assistance outside of these hours, please call the patient's attending physician.

## 2021-08-30 NOTE — Progress Notes (Signed)
Pt placed on BIPAP for night rest.

## 2021-08-30 NOTE — Progress Notes (Addendum)
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  She was recently hospitalized from 10/11-10/17 with acute on chronic hypoxic and hypercapnic respiratory failure requiring intubation.  She was referred to Dr. Lisbeth Renshaw of radiation oncology inpatient at [facility] to have PET scan on 17th of this month to evaluate new right lung mass found during previous hospitalization.  Patient was short of breath at home and also was febrile with temperature 101.9 F 2 days ago.  Patient is a CPAP at home. In the ED she was found to have worsening PCO2 81 mmHg on ABG requiring BiPAP.   Subjective   Patient seen and examined, breathing has improved.  Repeat ABG yesterday showed PCO2 down to 63 mmHg.   Assessment/Plan:   Acute on chronic respiratory failure with hypoxia and hypercapnia -Secondary to COPD exacerbation -Chest x-ray showed no acute abnormality but showed large right middle lobe nodule unchanged from recent CT from 10/8 concerning for neoplasm -Initial ABG showed PCO2 of 81, patient placed on BiPAP -Repeat ABG yesterday showed PCO2 of 63 mmHg. -Continue Solu-Medrol 40 mg IV every 12 hours -Continue DuoNeb nebulizers every 6 hours -Pulmicort nebulizer twice daily  Acute metabolic encephalopathy -Likely combination of acute hypercapnic respiratory failure along with sepsis -Resolved, mental status at baseline  Sepsis/E. coli bacteremia -Sepsis physiology has resolved -Patient has abnormal UA -Blood culture growing E. Coli -Urine culture grew insignificant with -Patient started on Rocephin -Lactic acid 1.3 -Follow blood culture results   Acute kidney injury -Likely  combination of medications as well as hypotension from sepsis -Patient has been on Lasix 20 mg daily at home for diastolic heart failure -Lasix is currently on hold due to acute kidney injury -Patient baseline creatinine is 0.8-1 -Today creatinine has improved to 1.19 -We will obtain renal ultrasound today -Follow BMP in am  Hypertension -Blood pressure is stable -Home BP medications including losartan, furosemide and metoprolol on hold due to acute kidney injury  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 Lisbeth Renshaw as outpatient  Chronic diastolic CHF -Last EF noted to be 65 to 70% with grade 1 diastolic dysfunction by echocardiogram -Chest x-ray shows no signs of fluid overload -Lasix on hold due to acute kidney injury and hypotension due to sepsis as above  Anemia of chronic disease -Hemoglobin is down to 7.6, her baseline hemoglobin around 8 -EGD on 02/17/2021 showed nonbleeding ulcer  -GI was consulted during previous admission however daughter did not want EGD or colonoscopy at that time -We will keep a close watch on hemoglobin and consult GI as needed  Hypothyroidism -Last TSH 0.148 on 07/24/2021 -Continue Synthroid  Scheduled medications:    atorvastatin  20 mg Oral QHS   budesonide (PULMICORT) nebulizer solution  0.25 mg Nebulization BID   buPROPion  300 mg Oral q morning   busPIRone  15 mg Oral BID   docusate sodium  100 mg Oral Daily   donepezil  10 mg Oral QHS  enoxaparin (LOVENOX) injection  30 mg Subcutaneous Q24H   guaiFENesin  600 mg Oral BID   labetalol  50 mg Oral BID   levothyroxine  137 mcg Oral Q0600   methylPREDNISolone (SOLU-MEDROL) injection  80 mg Intravenous Q24H   mirtazapine  15 mg Oral QHS    pantoprazole  40 mg Oral Daily   potassium chloride  10 mEq Oral BID WC   sodium chloride flush  3 mL Intravenous Q12H     Data Reviewed:   CBG:  No results for input(s): GLUCAP in the last 168 hours.  SpO2: 96 % O2 Flow Rate (L/min): 3 L/min FiO2 (%): 40 %    Vitals:   08/30/21 0734 08/30/21 0826 08/30/21 0845 08/30/21 1041  BP:  (!) 167/78 (!) 150/59 139/65  Pulse: 78 76 77 71  Resp: 19 (!) 23  (!) 23  Temp:  97.9 F (36.6 C)  98.6 F (37 C)  TempSrc:  Oral  Oral  SpO2: 93% 93%  96%  Weight:      Height:         Intake/Output Summary (Last 24 hours) at 08/30/2021 1141 Last data filed at 08/30/2021 0612 Gross per 24 hour  Intake 683 ml  Output 1600 ml  Net -917 ml    11/12 1901 - 11/14 0700 In: 9678 [P.O.:920; I.V.:6] Out: 2350 [Urine:2350]  Filed Weights   08/28/21 1400 08/29/21 0358 08/30/21 0500  Weight: 65.4 kg 65.6 kg 63.1 kg    Data Reviewed: Basic Metabolic Panel: Recent Labs  Lab 08/28/21 0929 08/28/21 1213 08/29/21 0117 08/30/21 0329  NA 137 138 136 138  K 3.8 3.8 4.8 4.6  CL 92*  --  91* 95*  CO2 39*  --  37* 37*  GLUCOSE 109*  --  136* 151*  BUN 27*  --  30* 27*  CREATININE 1.51*  --  1.46* 1.19*  CALCIUM 9.3  --  8.9 9.0   Liver Function Tests: Recent Labs  Lab 08/28/21 1127 08/30/21 0329  AST 16 11*  ALT 13 12  ALKPHOS 130* 84  BILITOT 0.8 0.4  PROT 5.9* 5.6*  ALBUMIN 2.1* 1.8*   No results for input(s): LIPASE, AMYLASE in the last 168 hours. Recent Labs  Lab 08/28/21 1134  AMMONIA <10   CBC: Recent Labs  Lab 08/28/21 0929 08/28/21 1213 08/29/21 0117 08/30/21 0329  WBC 13.1*  --  12.8* 11.3*  HGB 8.0* 9.2* 7.5* 7.6*  HCT 28.1* 27.0* 26.1* 26.0*  MCV 99.3  --  97.8 97.0  PLT 200  --  170 189   Cardiac Enzymes: No results for input(s): CKTOTAL, CKMB, CKMBINDEX, TROPONINI in the last 168 hours. BNP (last 3 results) Recent Labs    03/22/21 2325 07/23/21 2202 08/28/21 1133  BNP 48.0 147.2* 232.5*     ProBNP (last 3 results) No results for input(s): PROBNP in the last 8760 hours.  CBG: No results for input(s): GLUCAP in the last 168 hours.     Radiology Reports  No results found.     Antibiotics: Anti-infectives (From admission, onward)    Start     Dose/Rate Route Frequency Ordered Stop   08/28/21 1800  cefTRIAXone (ROCEPHIN) 2 g in sodium chloride 0.9 % 100 mL IVPB        2 g 200 mL/hr over 30 Minutes Intravenous Every 24 hours 08/28/21 1703     08/28/21 1800  azithromycin (ZITHROMAX) 500 mg in sodium chloride 0.9 % 250 mL IVPB  Status:  Discontinued        500 mg 250 mL/hr over 60 Minutes Intravenous Every 24 hours 08/28/21 1703 08/29/21 0628         DVT prophylaxis: Lovenox  Code Status: DNR  Family Communication: Discussed with patient's daughter on phone   Consultants:   Procedures:     Objective    Physical Examination:   General-appears in no acute distress Heart-S1-S2, regular, no murmur auscultated Lungs-clear to auscultation bilaterally, no wheezing or crackles auscultated Abdomen-soft, nontender, no organomegaly Extremities-no edema in the lower extremities Neuro-alert, oriented x3, no focal deficit noted  Status is: Inpatient  Dispo: The patient is from: Home              Anticipated d/c is to: Home              Anticipated d/c date is: 09/02/2021              Patient currently not stable for discharge  Barrier to discharge-follow Dr. Darrick Meigs Home  COVID-19 Labs  No results for input(s): DDIMER, FERRITIN, LDH, CRP in the last 72 hours.  Lab Results  Component Value Date   SARSCOV2NAA NEGATIVE 08/28/2021   [place] NEGATIVE 07/23/2021   [place] NEGATIVE 03/22/2021   [place] NEGATIVE 02/22/2021     Pressure Injury 02/16/21 Buttocks Right Stage 2 -  Partial thickness loss of dermis presenting as a shallow open injury with a red, pink wound bed without slough. (Active)  02/16/21 0230  Location: Buttocks   Location Orientation: Right  Staging: Stage 2 -  Partial thickness loss of dermis presenting as a shallow open injury with a red, pink wound bed without slough.  Wound Description (Comments):   Present on Admission: Yes        Recent Results (from the past 240 hour(s))  Resp Panel by RT-PCR (Flu A&B, Covid) Nasopharyngeal Swab     Status: None   Collection Time: 08/28/21  9:28 AM   Specimen: Nasopharyngeal Swab; Nasopharyngeal(NP) swabs in vial transport medium  Result Value Ref Range Status   SARS Coronavirus 2 by RT PCR NEGATIVE NEGATIVE Final    Comment: (NOTE) SARS-CoV-2 target nucleic acids are NOT DETECTED.  The SARS-CoV-2 RNA is generally detectable in upper respiratory specimens during the acute phase of infection. The lowest concentration of SARS-CoV-2 viral copies this assay can detect is 138 copies/mL. A negative result does not preclude SARS-Cov-2 infection and should not be used as the sole basis for treatment or other patient management decisions. A negative result may occur with  improper specimen collection/handling, submission of specimen other than nasopharyngeal swab, presence of viral mutation(s) within the areas targeted by this assay, and inadequate number of viral copies(<138 copies/mL). A negative result must be combined with clinical observations, patient history, and epidemiological information. The expected result is Negative.  Fact Sheet for Patients:  EntrepreneurPulse.com.au  Fact Sheet for Healthcare Providers:  IncredibleEmployment.be  This test is no t yet approved or cleared by the [place] FDA and  has been authorized for detection and/or diagnosis of SARS-CoV-2 by FDA under an Emergency Use Authorization (EUA). This EUA will remain  in effect (meaning this test can be used) for the duration of the COVID-19 declaration under Section 564(b)(1) of the Act, 21 U.S.C.section 360bbb-3(b)(1), unless the  authorization is terminated  or revoked sooner.       Influenza A by PCR NEGATIVE NEGATIVE Final   Influenza B by PCR NEGATIVE NEGATIVE Final    Comment: (NOTE) The  Xpert Xpress SARS-CoV-2/FLU/RSV plus assay is intended as an aid in the diagnosis of influenza from Nasopharyngeal swab specimens and should not be used as a sole basis for treatment. Nasal washings and aspirates are unacceptable for Xpert Xpress SARS-CoV-2/FLU/RSV testing.  Fact Sheet for Patients: EntrepreneurPulse.com.au  Fact Sheet for Healthcare Providers: IncredibleEmployment.be  This test is not yet approved or cleared by the [place] FDA and has been authorized for detection and/or diagnosis of SARS-CoV-2 by FDA under an Emergency Use Authorization (EUA). This EUA will remain in effect (meaning this test can be used) for the duration of the COVID-19 declaration under Section 564(b)(1) of the Act, 21 U.S.C. section 360bbb-3(b)(1), unless the authorization is terminated or revoked.  Performed at [facility], [place] [address] [place], [place] [address]   Culture, blood (routine x 2)     Status: None (Preliminary result)   Collection Time: 08/28/21  5:33 PM   Specimen: BLOOD  Result Value Ref Range Status   Specimen Description BLOOD LEFT ANTECUBITAL  Final   Special Requests   Final    BOTTLES DRAWN AEROBIC AND ANAEROBIC Blood Culture adequate volume   Culture  Setup Time   Final    GRAM NEGATIVE RODS IN BOTH AEROBIC AND ANAEROBIC BOTTLES CRITICAL VALUE NOTED.  VALUE IS CONSISTENT WITH PREVIOUSLY REPORTED AND CALLED VALUE.    Culture   Final    GRAM NEGATIVE RODS IDENTIFICATION TO FOLLOW Performed at [facility], [place] [address], [place], [place] [address]    Report Status PENDING  Incomplete  Culture, blood (routine x 2)     Status: Abnormal (Preliminary result)   Collection Time: 08/28/21  5:36 PM   Specimen: BLOOD RIGHT HAND  Result Value Ref  Range Status   Specimen Description BLOOD RIGHT HAND  Final   Special Requests   Final    BOTTLES DRAWN AEROBIC AND ANAEROBIC Blood Culture adequate volume   Culture  Setup Time   Final    GRAM NEGATIVE RODS IN BOTH AEROBIC AND ANAEROBIC BOTTLES CRITICAL RESULT CALLED TO, READ BACK BY AND VERIFIED WITH: V BRYK,PHARMD@0619  08/29/21 [place]    Culture (A)  Final    ESCHERICHIA COLI SUSCEPTIBILITIES TO FOLLOW Performed at [facility], [place] [address], [place], [place] [address]    Report Status PENDING  Incomplete  Blood Culture ID Panel (Reflexed)     Status: Abnormal   Collection Time: 08/28/21  5:36 PM  Result Value Ref Range Status   Enterococcus faecalis NOT DETECTED NOT DETECTED Final   Enterococcus Faecium NOT DETECTED NOT DETECTED Final   Listeria monocytogenes NOT DETECTED NOT DETECTED Final   Staphylococcus species NOT DETECTED NOT DETECTED Final   Staphylococcus aureus (BCID) NOT DETECTED NOT DETECTED Final   Staphylococcus epidermidis NOT DETECTED NOT DETECTED Final   Staphylococcus lugdunensis NOT DETECTED NOT DETECTED Final   Streptococcus species NOT DETECTED NOT DETECTED Final   Streptococcus agalactiae NOT DETECTED NOT DETECTED Final   Streptococcus pneumoniae NOT DETECTED NOT DETECTED Final   Streptococcus pyogenes NOT DETECTED NOT DETECTED Final   A.calcoaceticus-baumannii NOT DETECTED NOT DETECTED Final   Bacteroides fragilis NOT DETECTED NOT DETECTED Final   Enterobacterales DETECTED (A) NOT DETECTED Final    Comment: Enterobacterales represent a large order of gram negative bacteria, not a single organism. CRITICAL RESULT CALLED TO, READ BACK BY AND VERIFIED WITH: V BYRK,PHARMD@0619  08/29/21 [place]    Enterobacter cloacae complex NOT DETECTED NOT DETECTED Final   Escherichia coli DETECTED (  A) NOT DETECTED Final    Comment: CRITICAL RESULT CALLED TO, READ BACK BY AND VERIFIED WITH: V BRYK,PHARMD@0619  08/29/21 [place]    Klebsiella aerogenes NOT DETECTED NOT DETECTED  Final   Klebsiella oxytoca NOT DETECTED NOT DETECTED Final   Klebsiella pneumoniae NOT DETECTED NOT DETECTED Final   Proteus species NOT DETECTED NOT DETECTED Final   Salmonella species NOT DETECTED NOT DETECTED Final   Serratia marcescens NOT DETECTED NOT DETECTED Final   Haemophilus influenzae NOT DETECTED NOT DETECTED Final   Neisseria meningitidis NOT DETECTED NOT DETECTED Final   Pseudomonas aeruginosa NOT DETECTED NOT DETECTED Final   Stenotrophomonas maltophilia NOT DETECTED NOT DETECTED Final   Candida albicans NOT DETECTED NOT DETECTED Final   Candida auris NOT DETECTED NOT DETECTED Final   Candida glabrata NOT DETECTED NOT DETECTED Final   Candida krusei NOT DETECTED NOT DETECTED Final   Candida parapsilosis NOT DETECTED NOT DETECTED Final   Candida tropicalis NOT DETECTED NOT DETECTED Final   Cryptococcus neoformans/gattii NOT DETECTED NOT DETECTED Final   CTX-M ESBL NOT DETECTED NOT DETECTED Final   Carbapenem resistance IMP NOT DETECTED NOT DETECTED Final   Carbapenem resistance KPC NOT DETECTED NOT DETECTED Final   Carbapenem resistance NDM NOT DETECTED NOT DETECTED Final   Carbapenem resist OXA 48 LIKE NOT DETECTED NOT DETECTED Final   Carbapenem resistance VIM NOT DETECTED NOT DETECTED Final    Comment: Performed at  Hospital Lab, [address] [place], [place] [address]  Urine Culture     Status: Abnormal   Collection Time: 08/29/21  4:00 AM   Specimen: Urine, Clean Catch  Result Value Ref Range Status   Specimen Description URINE, CLEAN CATCH  Final   Special Requests NONE  Final   Culture (A)  Final    <10,000 COLONIES/mL INSIGNIFICANT GROWTH Performed at [facility], [place] [address] [place], [place] [address]    Report Status 08/30/2021 FINAL  Final    Oswald Hillock   [facility] If 7PM-7AM, please contact night-coverage at [email or web address], Office  [phone]   08/30/2021, 11:41 AM  LOS: 2 days

## 2021-08-30 NOTE — Plan of Care (Signed)
  Problem: Education: Goal: Knowledge of General Education information will improve Description: Including pain rating scale, medication(s)/side effects and non-pharmacologic comfort measures Outcome: Progressing   Problem: Health Behavior/Discharge Planning: Goal: Ability to manage health-related needs will improve Outcome: Progressing   Problem: Clinical Measurements: Goal: Ability to maintain clinical measurements within normal limits will improve Outcome: Progressing Goal: Diagnostic test results will improve Outcome: Progressing Goal: Respiratory complications will improve Outcome: Progressing   Problem: Activity: Goal: Risk for activity intolerance will decrease Outcome: Progressing   Problem: Nutrition: Goal: Adequate nutrition will be maintained Outcome: Progressing

## 2021-08-31 ENCOUNTER — Ambulatory Visit (HOSPITAL_COMMUNITY): Payer: Medicare Other

## 2021-08-31 ENCOUNTER — Ambulatory Visit: Payer: Medicare Other

## 2021-08-31 ENCOUNTER — Ambulatory Visit: Payer: Medicare Other | Admitting: Radiation Oncology

## 2021-08-31 DIAGNOSIS — N189 Chronic kidney disease, unspecified: Secondary | ICD-10-CM

## 2021-08-31 DIAGNOSIS — N390 Urinary tract infection, site not specified: Secondary | ICD-10-CM

## 2021-08-31 DIAGNOSIS — D631 Anemia in chronic kidney disease: Secondary | ICD-10-CM

## 2021-08-31 DIAGNOSIS — N179 Acute kidney failure, unspecified: Secondary | ICD-10-CM

## 2021-08-31 DIAGNOSIS — A419 Sepsis, unspecified organism: Secondary | ICD-10-CM

## 2021-08-31 LAB — BASIC METABOLIC PANEL
Anion gap: 8 (ref 5–15)
BUN: 24 mg/dL — ABNORMAL HIGH (ref 8–23)
CO2: 35 mmol/L — ABNORMAL HIGH (ref 22–32)
Calcium: 9.1 mg/dL (ref 8.9–10.3)
Chloride: 96 mmol/L — ABNORMAL LOW (ref 98–111)
Creatinine, Ser: 1.09 mg/dL — ABNORMAL HIGH (ref 0.44–1.00)
GFR, Estimated: 53 mL/min — ABNORMAL LOW (ref >60)
Glucose, Bld: 180 mg/dL — ABNORMAL HIGH (ref 70–99)
Potassium: 4.7 mmol/L (ref 3.5–5.1)
Sodium: 139 mmol/L (ref 135–145)

## 2021-08-31 LAB — CBC
HCT: 26.2 % — ABNORMAL LOW (ref 36.0–46.0)
Hemoglobin: 7.6 g/dL — ABNORMAL LOW (ref 12.0–15.0)
MCH: 28 pg (ref 26.0–34.0)
MCHC: 29 g/dL — ABNORMAL LOW (ref 30.0–36.0)
MCV: 96.7 fL (ref 80.0–100.0)
Platelets: 207 10*3/uL (ref 150–400)
RBC: 2.71 MIL/uL — ABNORMAL LOW (ref 3.87–5.11)
RDW: 15.9 % — ABNORMAL HIGH (ref 11.5–15.5)
WBC: 7.4 10*3/uL (ref 4.0–10.5)
nRBC: 0 % (ref 0.0–0.2)

## 2021-08-31 LAB — CULTURE, BLOOD (ROUTINE X 2)
Special Requests: ADEQUATE
Special Requests: ADEQUATE

## 2021-08-31 MED ORDER — ENOXAPARIN SODIUM 40 MG/0.4ML IJ SOSY: 40.0000 mg | PREFILLED_SYRINGE | INTRAMUSCULAR | Status: DC

## 2021-08-31 MED ORDER — FUROSEMIDE 20 MG PO TABS: 20.0000 mg | ORAL_TABLET | ORAL | 3 refills | Status: DC

## 2021-08-31 MED ORDER — ONDANSETRON HCL 4 MG PO TABS: 4.0000 mg | ORAL_TABLET | Freq: Three times a day (TID) | ORAL | 0 refills | Status: DC | PRN

## 2021-08-31 MED ORDER — BUSPIRONE HCL 5 MG PO TABS: 5.0000 mg | ORAL_TABLET | Freq: Three times a day (TID) | ORAL | 3 refills | Status: DC

## 2021-08-31 MED ORDER — ONDANSETRON HCL 4 MG/2ML IJ SOLN
4.0000 mg | Freq: Four times a day (QID) | INTRAMUSCULAR | Status: DC | PRN
  Administered 2021-08-31: 4 mg via INTRAVENOUS
  Filled 2021-08-31: qty 2

## 2021-08-31 MED ORDER — CIPROFLOXACIN HCL 500 MG PO TABS: 500.0000 mg | ORAL_TABLET | Freq: Two times a day (BID) | ORAL | Status: DC

## 2021-08-31 MED ORDER — CEFAZOLIN SODIUM-DEXTROSE 2-4 GM/100ML-% IV SOLN: 2.0000 g | Freq: Three times a day (TID) | INTRAVENOUS | Status: DC

## 2021-08-31 MED ORDER — CIPROFLOXACIN HCL 500 MG PO TABS: 500.0000 mg | ORAL_TABLET | Freq: Two times a day (BID) | ORAL | 0 refills | Status: AC

## 2021-08-31 NOTE — TOC Initial Note (Signed)
Transition of Care [facility]) - Initial/Assessment Note    Patient Details  Name: Holly [name] MRN: [id number] Date of Birth: 04-Jul-1946  Transition [facility] CM/SW Contact:    Zenon Mayo, RN Phone Number: 08/31/2021, 2:33 PM  Clinical Narrative:                 Patient is from home with daughter , [name] [phone], she has home oxygen with [employer], she has  a w/chair, walker, rollator, bsc, walkin shower,  NCM offered choice, to patient, she states to call her daughter.  Daughter states she is active with [facility] for Oxly.  NCM confirmed with [name] with [name]. Daughter is bringing oxygen tank to patient room prior to dc.  Her address is [place], [place],   Expected Discharge Plan: [place] Barriers to Discharge: No Barriers Identified   Patient Goals and CMS Choice Patient states their goals for this hospitalization and ongoing recovery are:: return home CMS Medicare.gov Compare Post Acute Care list provided to:: Patient Represenative (must comment) Choice offered to / list presented to : Adult Children, Patient  Expected Discharge Plan and Services Expected Discharge Plan: [place]   Discharge Planning Services: CM Consult Post Acute Care Choice: Home Health, Resumption of Svcs/PTA Provider Living arrangements for the past 2 months: Single Family Home Expected Discharge Date: 08/31/21                         HH Arranged: PT, OT HH Agency: [place] Date [facility] Agency Contacted: 08/31/21 Time HH Agency Contacted: 67 Representative spoke with at [facility]: [name]  Prior Living Arrangements/Services Living arrangements for the past 2 months: [place] Lives with:: Adult Children Patient language and need for interpreter reviewed:: Yes Do you feel safe going back to the place where you live?: Yes      Need for Family Participation in Patient Care: Yes (Comment) Care giver  support system in place?: Yes (comment) Current home services: DME, Home OT, Home PT (w/chair, walker, rollator, bsc, walkin shower) Criminal Activity/Legal Involvement Pertinent to Current Situation/Hospitalization: No - Comment as needed  Activities of Daily Living Home Assistive Devices/Equipment: Oxygen ADL Screening (condition at time of admission) Patient's cognitive ability adequate to safely complete daily activities?: Yes Is the patient deaf or have difficulty hearing?: No Does the patient have difficulty seeing, even when wearing glasses/contacts?: No Does the patient have difficulty concentrating, remembering, or making decisions?: No Patient able to express need for assistance with ADLs?: Yes Does the patient have difficulty dressing or bathing?: No Independently performs ADLs?: No Does the patient have difficulty walking or climbing stairs?: No Weakness of Legs: Both Weakness of Arms/Hands: Both  Permission Sought/Granted                  Emotional Assessment   Attitude/Demeanor/Rapport: Engaged Affect (typically observed): Appropriate Orientation: : Oriented to Self, Oriented to Place, Oriented to  Time, Oriented to Situation Alcohol / Substance Use: Not Applicable Psych Involvement: No (comment)  Admission diagnosis:  COPD exacerbation ([place]) [J44.1] Severe comorbid illness [R69] Acute on chronic respiratory failure with hypoxia and hypercapnia (HCC) [O27.74, J96.22] Patient Active Problem List   Diagnosis Date Noted   E coli bacteremia 08/29/2021   Sepsis secondary to UTI ([address]) 08/29/2021   Acute on chronic respiratory failure with hypoxia and hypercapnia ([place]) 08/28/2021   Anemia due  to chronic kidney disease 16/07/9603   Acute metabolic encephalopathy 54/06/8118   GERD (gastroesophageal reflux disease) 08/28/2021   DNR (do not resuscitate) 08/28/2021   SIRS (systemic inflammatory response syndrome) ([place]) 08/28/2021   Acute respiratory failure with  hypoxia and hypercapnia (HCC)    Septic shock ([place])    Goals of care, counseling/discussion    Acute on chronic respiratory failure ([place]) 07/23/2021   Hypotension 02/23/2021   Hypokalemia 02/23/2021   Acute renal failure superimposed on stage 3a chronic kidney disease ([place]) 02/22/2021   GI bleed 02/16/2021   ABLA (acute blood loss anemia) 02/16/2021   Diverticulitis 02/16/2021   Current chronic use of systemic steroids 02/16/2021   Pressure injury of skin 02/16/2021   Olecranon bursitis of right elbow 10/02/2020   Ulna, olecranon process fracture, right, closed, with routine healing, subsequent encounter 10/01/2020   Primary malignant neoplasm of left upper lobe of lung ([place]) 07/09/2019   Closed fracture of right olecranon process 12/04/2017   Cough 11/11/2015   Chronic respiratory failure with hypoxia ([name]) 11/11/2014   Cigarette smoker 10/16/2014   Multinodular thyroid 07/30/2014   Neoplasm of uncertain behavior of thyroid gland, 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   COPD exacerbation ([place]) 02/11/2013   Pedal edema 02/11/2013   COPD GOLD III  06/11/2010   CARCINOMA, BASAL CELL, 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   HYPERLIPIDEMIA, MIXED 05/13/2010   HYPERCALCEMIA 05/13/2010   Anxiety state 05/13/2010   Essential hypertension 05/13/2010   CERVICALGIA 05/13/2010   OSTEOPENIA 05/13/2010   URINARY INCONTINENCE 05/13/2010   CARCINOMA, BASAL CELL, 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   PCP:  Harlan Stains, MD Pharmacy:   [facility] [address], [place] [address] Phone: [phone] Fax: [phone]  [facility] - [place], [place] - [place] [address] [place] [address] Phone: [phone] Fax: [phone]     Social Determinants of Health (SDOH) Interventions    Readmission Risk Interventions Readmission Risk Prevention Plan 08/31/2021 07/28/2021 02/18/2021  Transportation Screening Complete Complete Complete   PCP or Specialist Appt within 5-7 Days - - Complete  Home Care Screening - - Complete  Medication Review (RN CM) - - Complete  Medication Review ([place]) Complete Referral to Pharmacy -  PCP or Specialist appointment within 3-5 days of discharge Complete Complete -  [place] or Home Care Consult Complete Patient refused -  SW Recovery Care/Counseling Consult Complete Complete -  Palliative Care Screening Not Applicable Not Applicable -  [place] Not Applicable Not Applicable -  Some recent data might be hidden

## 2021-08-31 NOTE — Progress Notes (Addendum)
Blood cultures came back with pan sensitive e.coli bacteremia. D/w Dr. Darrick Meigs and we will optimize ceftriaxone to cefazolin to complete a total of 7 days. Can transition to PO Keflex or Cefadroxil for discharge if needed.   CrCl ~50 ml/min  Cefazolin 2g IV q8>>11/19   Onnie Boer, PharmD, [place], AAHIVP, CPP Infectious Disease Pharmacist 08/31/2021 10:49 AM  Addendum  MD spoke with ID and plan for 5d of PO cipro instead.   Onnie Boer, PharmD, BCIDP, AAHIVP, CPP Infectious Disease Pharmacist 08/31/2021 11:40 AM

## 2021-08-31 NOTE — Plan of Care (Signed)

## 2021-08-31 NOTE — Progress Notes (Signed)
Discharged home accompanied by daughter. Belongings taken home. 

## 2021-08-31 NOTE — TOC Transition Note (Signed)
Transition of Care [facility]) - CM/SW Discharge Note   Patient Details  Name: Holly [name] MRN: [id number] Date of Birth: 11/12/1945  Transition [facility]) CM/SW Contact:  Zenon Mayo, RN Phone Number: 08/31/2021, 2:37 PM   Clinical Narrative:    Patient is from home with daughter , [name] [phone], she has home oxygen with [employer], she has  a w/chair, walker, rollator, bsc, walkin shower,  NCM offered choice, to patient, she states to call her daughter.  Daughter states she is active with [facility] for [place].  NCM confirmed with [name] with [name]. Daughter is bringing oxygen tank to patient room prior to dc.  Her address is [place], [place].     Final next level of care: [place] Barriers to Discharge: No Barriers Identified   Patient Goals and CMS Choice Patient states their goals for this hospitalization and ongoing recovery are:: return home CMS Medicare.gov Compare Post Acute Care list provided to:: Patient Represenative (must comment) Choice offered to / list presented to : Adult Children, Patient  Discharge Placement                       Discharge Plan and Services   Discharge Planning Services: CM Consult Post Acute Care Choice: Home Health, Resumption of Svcs/PTA Provider                    HH Arranged: PT, OT [place] Agency: [place] Date [place]: 08/31/21 Time [place]: 1432 Representative spoke with at [place]: [place] ([place]) Interventions     Readmission Risk Interventions Readmission Risk Prevention Plan 08/31/2021 07/28/2021 02/18/2021  Transportation Screening Complete Complete Complete  PCP or Specialist Appt within 5-7 Days - - Complete  Home Care Screening - - Complete  Medication Review (RN CM) - - Complete  Medication Review (RN Care Manager) Complete Referral to Pharmacy -  PCP or Specialist appointment within 3-5  days of discharge Complete Complete -  [place] or Home Care Consult Complete Patient refused -  SW Recovery Care/Counseling Consult Complete Complete -  Palliative Care Screening Not Applicable Not Applicable -  [place] Not Applicable Not Applicable -  Some recent data might be hidden

## 2021-08-31 NOTE — Care Management Important Message (Signed)
Important Message  Patient Details  Name: Holly [name] MRN: [id number] Date of Birth: 29-Jan-1946   Medicare Important Message Given:  Yes     Orbie Pyo 08/31/2021, 2:02 PM

## 2021-08-31 NOTE — Discharge Summary (Addendum)
Physician Discharge Summary  Holly [name] EFE:[id number] DOB: 12-Jul-1946 DOA: 08/28/2021  PCP: Harlan Stains, MD  Admit date: 08/28/2021 Discharge date: 08/31/2021  Time spent: 60 minutes  Recommendations for Outpatient Follow-up:  Follow-up PCP in 1 week  Discharge Diagnoses:  Principal Problem:   Sepsis secondary to UTI [facility]) Active Problems:   Anxiety state   COPD exacerbation ([place])   Primary malignant neoplasm of left upper lobe of lung ([place])   Acute on chronic respiratory failure with hypoxia and hypercapnia (HCC)   Anemia due to chronic kidney disease   Acute metabolic encephalopathy   GERD (gastroesophageal reflux disease)   DNR (do not resuscitate)   SIRS (systemic inflammatory response syndrome) ([place])   E coli bacteremia   Discharge Condition: Stable  Diet recommendation: Heart healthy diet  Filed Weights   08/29/21 0358 08/30/21 0500 08/31/21 0500  Weight: 65.6 kg 63.1 kg 65.3 kg    History of present illness:  75 year old female with medical history of COPD, on home oxygen, steroid-dependent, 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  She was recently hospitalized from 10/11-10/17 with acute on chronic hypoxic and hypercapnic respiratory failure requiring intubation.  She was referred to Dr. Lisbeth Renshaw of radiation oncology inpatient at [facility] to have PET scan on 17th of this month to evaluate new right lung mass found during previous hospitalization.   Patient was short of breath at home and also was febrile with temperature 101.9 F 2 days ago.  Patient is a CPAP at home. In the ED she was found to have worsening PCO2 81 mmHg on ABG requiring BiPAP.    Hospital Course:   Acute on chronic respiratory failure with hypoxia and hypercapnia -Secondary to COPD exacerbation/polypharmacy with multiple sedating medications -Chest x-ray showed no acute  abnormality but showed large right middle lobe nodule unchanged from recent CT from 10/8 concerning for neoplasm -Initial ABG showed PCO2 of 81, patient placed on BiPAP -Repeat ABG yesterday showed PCO2 of 63 mmHg. -Patient was started on  Solu-Medrol 40 mg IV every 12 hours -At this time patient is back to baseline, requiring 3 L/min of oxygen -Patient is on CPAP at home -Follow-up outpatient pulmonologist Dr. Vonzella Nipple for further recommendations    Acute metabolic encephalopathy -Likely combination of acute hypercapnic respiratory failure along with sepsis -Resolved, mental status at baseline -Has been started on multiple sedating medications outpatient -We will discontinue Remeron 15 mg daily, change to 5 mg p.o. 3 times daily -Also recommended patient not to use Xanax on a scheduled basis only use it as as needed for anxiety or sleep -Patient is on tramadol 50 mg p.o. every 6 hours as needed, however daughter tells me that she only uses sparingly -Patient is also on Wellbutrin 300 mg p.o. daily   Sepsis/E. coli bacteremia -Sepsis physiology has resolved -Patient has abnormal UA -Blood culture growing E. Coli -Urine culture grew insignificant with -Patient started on Rocephin -Lactic acid 1.3 -Blood culture shows E. coli, pansensitive.  -Renal ultrasound showed no significant abnormality -I called and discussed with ID Dr. Laurice Record, she recommends to discharge on Cipro 500 mg p.o. twice daily for 5 more days     Acute kidney injury -Likely combination of medications as well as hypotension from sepsis -Patient has been on Lasix 20 mg daily at home for diastolic heart failure -Lasix is currently on hold due to acute kidney injury -Patient baseline creatinine  is 0.8-1 -Today creatinine has improved to 1.09 -Renal ultrasound is unremarkable -We will change Lasix to 20 mg p.o. 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 -Patient has prior history of radiation to left lung -Currently on palliative radiation for new right middle lobe mass that increased to 2 x 1.7 x 2.4 mm per last CT -Followed by radiation oncology as outpatient, plan for PET scan on 11/17    Chronic diastolic CHF -Last EF noted to be 65 to 70% with grade 1 diastolic dysfunction by echocardiogram -Chest x-ray shows no signs of fluid overload -Lasix on hold due to acute kidney injury and hypotension due to sepsis as above   Anemia of chronic disease -Hemoglobin is down to 7.6, her baseline hemoglobin around 8 -EGD on 02/17/2021 showed nonbleeding ulcer  -Follow-up GI as outpatient   Hypothyroidism -Last TSH 0.148 on 07/24/2021 -Continue Synthroid  Procedures: None  Consultations:   Discharge Exam: Vitals:   08/31/21 0845 08/31/21 1111  BP:  (!) 163/63  Pulse: 73 68  Resp: (!) 23 19  Temp:  98.4 F (36.9 C)  SpO2: 96% 98%    General: Appears in no acute distress Cardiovascular: S1-S2, regular, no murmur auscultated Respiratory: Clear to auscultation bilaterally  Discharge Instructions   Discharge Instructions     Diet - low sodium heart healthy   Complete by: As directed    Increase activity slowly   Complete by: As directed       Allergies as of 08/31/2021       Reactions   Amlodipine Besylate Swelling   Edema   Codeine Nausea And Vomiting   Oxycodone-acetaminophen Nausea And Vomiting        Medication List     STOP taking these medications    mirtazapine 15 MG tablet Commonly known as: REMERON   potassium chloride 10 MEQ tablet Commonly known as: KLOR-CON       TAKE these medications    acetaminophen 325 MG tablet Commonly known as: TYLENOL Take 650 mg by mouth every 6 (six) hours as needed for mild pain.   ALPRAZolam 0.5 MG tablet Commonly known as: XANAX Take 0.5 mg by mouth 2 (two) times daily as needed for anxiety.   atorvastatin 20 MG  tablet Commonly known as: LIPITOR Take 20 mg by mouth at bedtime.   buPROPion 300 MG 24 hr tablet Commonly known as: WELLBUTRIN XL Take 300 mg by mouth every morning.   busPIRone 5 MG tablet Commonly known as: BUSPAR Take 1 tablet (5 mg total) by mouth 3 (three) times daily. What changed:  medication strength how much to take when to take this   ciprofloxacin 500 MG tablet Commonly known as: CIPRO Take 1 tablet (500 mg total) by mouth 2 (two) times daily for 5 days.   COLACE PO Take 1-2 tablets by mouth daily.   donepezil 10 MG tablet Commonly known as: ARICEPT Take 10 mg by mouth at bedtime.   Fluticasone-Umeclidin-Vilant 100-62.5-25 MCG/INH Aepb Inhale 1 puff into the lungs daily. Trelegy   furosemide 20 MG tablet Commonly known as: LASIX Take 1 tablet (20 mg total) by mouth every other day. What changed: when to take this   levothyroxine 137 MCG tablet Commonly known as: SYNTHROID Take 137 mcg by mouth daily before breakfast.   losartan 50 MG tablet Commonly known as: COZAAR Take 1 tablet (50 mg total) by mouth  daily.   metoprolol succinate 50 MG 24 hr tablet Commonly known as: TOPROL-XL Take 50 mg by mouth daily. Take with or immediately following a meal.   ondansetron 4 MG tablet Commonly known as: Zofran Take 1 tablet (4 mg total) by mouth every 8 (eight) hours as needed for nausea or vomiting.   pantoprazole 40 MG tablet Commonly known as: PROTONIX Take 1 tablet (40 mg total) by mouth daily.   polyethylene glycol 17 g packet Commonly known as: MIRALAX / GLYCOLAX Take 17 g by mouth daily as needed for moderate constipation.   predniSONE 20 MG tablet Commonly known as: DELTASONE Take 40 mg by mouth daily. Take 40mg  daily for five days (08/25/21-08/29/21) then go back to 20mg  daily continuous.   traMADol 50 MG tablet Commonly known as: ULTRAM Take 50 mg by mouth every 6 (six) hours as needed for moderate pain.   Ventolin HFA 108 (90 Base) MCG/ACT  inhaler Generic drug: albuterol INHALE 2 PUFFS INTO THE LUNGS EVERY 4 HOURS AS NEEDED FOR WHEEZING ORSHORTNESS OF BREATH What changed: See the new instructions.       Allergies  Allergen Reactions   Amlodipine Besylate Swelling    Edema   Codeine Nausea And Vomiting   Oxycodone-Acetaminophen Nausea And Vomiting    Follow-up Information     Harlan Stains, MD Follow up in 1 week(s).   Specialty: Family Medicine Contact information: [address], [place] [address] [phone]                  The results of significant diagnostics from this hospitalization (including imaging, microbiology, ancillary and laboratory) are listed below for reference.    Significant Diagnostic Studies: DG Chest 2 View  Result Date: 08/28/2021 CLINICAL DATA:  MIKEYA TOMASETTI , a 75 y.o. female was evaluated in triage. Pt complains of increased SOB since Thursday. Endorses slightly elevated temperature, cough, fatigue, confusion. Desaturated to 83% on home 3L, improved with nonrebreath.*comment was truncated*SOB EXAM: CHEST - 2 VIEW COMPARISON:  07/24/2021, chest radiograph and CT FINDINGS: Normal cardiac silhouette. Again demonstrated 2 cm nodule in the RIGHT middle lobe. No effusion, infiltrate pneumothorax. No acute osseous abnormality. IMPRESSION: 1.  No acute cardiopulmonary process. 2. Large RIGHT middle lobe nodule unchanged from recent CT. Nodule described as suspicious for pulmonary neoplasm on CT 07/24/2021. Electronically Signed   By: Suzy Bouchard M.D.   On: 08/28/2021 10:11   US RENAL  Result Date: 08/30/2021 CLINICAL DATA:  Acute kidney injury. EXAM: RENAL / URINARY TRACT ULTRASOUND COMPLETE COMPARISON:  CT of the abdomen pelvis on 07/24/2021 FINDINGS: Right Kidney: Renal measurements: 10.3 x 5.2 x 5.0 centimeters = volume: 140.3 mL. Echogenicity within normal limits. No mass or hydronephrosis visualized. Left Kidney: Renal measurements: 10.1 x 5.1 x 4.3  centimeters = volume: 116.71 mL. Echogenicity within normal limits. No mass or hydronephrosis visualized. Bladder: Appears normal for degree of bladder distention. Other: None. IMPRESSION: No evidence for acute  abnormality. Electronically Signed   By: [name] M.D.   On: 08/30/2021 13:29    Microbiology: Recent Results (from the past 240 hour(s))  Resp Panel by RT-PCR (Flu A&B, Covid) Nasopharyngeal Swab     Status: None   Collection Time: 08/28/21  9:28 AM   Specimen: Nasopharyngeal Swab; Nasopharyngeal(NP) swabs in vial transport medium  Result Value Ref Range Status   SARS Coronavirus 2 by RT PCR NEGATIVE NEGATIVE Final    Comment: (NOTE) SARS-CoV-2 target nucleic acids are NOT  DETECTED.  The SARS-CoV-2 RNA is generally detectable in upper respiratory specimens during the acute phase of infection. The lowest concentration of SARS-CoV-2 viral copies this assay can detect is 138 copies/mL. A negative result does not preclude SARS-Cov-2 infection and should not be used as the sole basis for treatment or other patient management decisions. A negative result may occur with  improper specimen collection/handling, submission of specimen other than nasopharyngeal swab, presence of viral mutation(s) within the areas targeted by this assay, and inadequate number of viral copies(<138 copies/mL). A negative result must be combined with clinical observations, patient history, and epidemiological information. The expected result is Negative.  Fact Sheet for Patients:  EntrepreneurPulse.com.au  Fact Sheet for Healthcare Providers:  IncredibleEmployment.be  This test is no t yet approved or cleared by the [place] FDA and  has been authorized for detection and/or diagnosis of SARS-CoV-2 by FDA under an Emergency Use Authorization (EUA). This EUA will remain  in effect (meaning this test can be used) for the duration of the COVID-19 declaration  under Section 564(b)(1) of the Act, 21 U.S.C.section 360bbb-3(b)(1), unless the authorization is terminated  or revoked sooner.       Influenza A by PCR NEGATIVE NEGATIVE Final   Influenza B by PCR NEGATIVE NEGATIVE Final    Comment: (NOTE) The Xpert Xpress SARS-CoV-2/FLU/RSV plus assay is intended as an aid in the diagnosis of influenza from Nasopharyngeal swab specimens and should not be used as a sole basis for treatment. Nasal washings and aspirates are unacceptable for Xpert Xpress SARS-CoV-2/FLU/RSV testing.  Fact Sheet for Patients: EntrepreneurPulse.com.au  Fact Sheet for Healthcare Providers: IncredibleEmployment.be  This test is not yet approved or cleared by the [place] FDA and has been authorized for detection and/or diagnosis of SARS-CoV-2 by FDA under an Emergency Use Authorization (EUA). This EUA will remain in effect (meaning this test can be used) for the duration of the COVID-19 declaration under Section 564(b)(1) of the Act, 21 U.S.C. section 360bbb-3(b)(1), unless the authorization is terminated or revoked.  Performed at [facility], [place] [address], [place], [place] [address]   Culture, blood (routine x 2)     Status: Abnormal   Collection Time: 08/28/21  5:33 PM   Specimen: BLOOD  Result Value Ref Range Status   Specimen Description BLOOD LEFT ANTECUBITAL  Final   Special Requests   Final    BOTTLES DRAWN AEROBIC AND ANAEROBIC Blood Culture adequate volume   Culture  Setup Time   Final    GRAM NEGATIVE RODS IN BOTH AEROBIC AND ANAEROBIC BOTTLES CRITICAL VALUE NOTED.  VALUE IS CONSISTENT WITH PREVIOUSLY REPORTED AND CALLED VALUE.    Culture (A)  Final    ESCHERICHIA COLI SUSCEPTIBILITIES PERFORMED ON PREVIOUS CULTURE WITHIN THE LAST 5 DAYS. Performed at [facility], [place] [address], [place], [place] [address]    Report Status 08/31/2021 FINAL  Final  Culture, blood (routine x 2)     Status:  Abnormal   Collection Time: 08/28/21  5:36 PM   Specimen: BLOOD RIGHT HAND  Result Value Ref Range Status   Specimen Description BLOOD RIGHT HAND  Final   Special Requests   Final    BOTTLES DRAWN AEROBIC AND ANAEROBIC Blood Culture adequate volume   Culture  Setup Time   Final    GRAM NEGATIVE RODS IN BOTH AEROBIC AND ANAEROBIC BOTTLES CRITICAL RESULT CALLED TO, READ BACK BY AND VERIFIED WITH: V BRYK,PHARMD@0619  08/29/21 [place] Performed at [facility], [address] [place], [place] [address]    Culture ESCHERICHIA COLI (A)  Final   Report Status 08/31/2021 FINAL  Final   Organism ID, Bacteria ESCHERICHIA COLI  Final      Susceptibility   Escherichia coli - MIC*    AMPICILLIN <=2 SENSITIVE Sensitive     CEFAZOLIN <=4 SENSITIVE Sensitive     CEFEPIME <=0.12 SENSITIVE Sensitive     CEFTAZIDIME <=1 SENSITIVE Sensitive     CEFTRIAXONE <=0.25 SENSITIVE Sensitive     CIPROFLOXACIN <=0.25 SENSITIVE Sensitive     GENTAMICIN <=1 SENSITIVE Sensitive     IMIPENEM <=0.25 SENSITIVE Sensitive     TRIMETH/SULFA <=20 SENSITIVE Sensitive     AMPICILLIN/SULBACTAM <=2 SENSITIVE Sensitive     PIP/TAZO <=4 SENSITIVE Sensitive     * ESCHERICHIA COLI  Blood Culture ID Panel (Reflexed)     Status: Abnormal   Collection Time: 08/28/21  5:36 PM  Result Value Ref Range Status   Enterococcus faecalis NOT DETECTED NOT DETECTED Final   Enterococcus Faecium NOT DETECTED NOT DETECTED Final   Listeria monocytogenes NOT DETECTED NOT DETECTED Final   Staphylococcus species NOT DETECTED NOT DETECTED Final   Staphylococcus aureus (BCID) NOT DETECTED NOT DETECTED Final   Staphylococcus epidermidis NOT DETECTED NOT DETECTED Final   Staphylococcus lugdunensis NOT DETECTED NOT DETECTED Final   Streptococcus species NOT DETECTED NOT DETECTED Final   Streptococcus agalactiae NOT DETECTED NOT DETECTED Final   Streptococcus pneumoniae NOT DETECTED NOT DETECTED Final   Streptococcus pyogenes NOT DETECTED NOT  DETECTED Final   A.calcoaceticus-baumannii NOT DETECTED NOT DETECTED Final   Bacteroides fragilis NOT DETECTED NOT DETECTED Final   Enterobacterales DETECTED (A) NOT DETECTED Final    Comment: Enterobacterales represent a large order of gram negative bacteria, not a single organism. CRITICAL RESULT CALLED TO, READ BACK BY AND VERIFIED WITH: V BYRK,PHARMD@0619  08/29/21 [place]    Enterobacter cloacae complex NOT DETECTED NOT DETECTED Final   Escherichia coli DETECTED (A) NOT DETECTED Final    Comment: CRITICAL RESULT CALLED TO, READ BACK BY AND VERIFIED WITH: V BRYK,PHARMD@0619  08/29/21 [place]    Klebsiella aerogenes NOT DETECTED NOT DETECTED Final   Klebsiella oxytoca NOT DETECTED NOT DETECTED Final   Klebsiella pneumoniae NOT DETECTED NOT DETECTED Final   Proteus species NOT DETECTED NOT DETECTED Final   Salmonella species NOT DETECTED NOT DETECTED Final   Serratia marcescens NOT DETECTED NOT DETECTED Final   Haemophilus influenzae NOT DETECTED NOT DETECTED Final   Neisseria meningitidis NOT DETECTED NOT DETECTED Final   Pseudomonas aeruginosa NOT DETECTED NOT DETECTED Final   Stenotrophomonas maltophilia NOT DETECTED NOT DETECTED Final   Candida albicans NOT DETECTED NOT DETECTED Final   Candida auris NOT DETECTED NOT DETECTED Final   Candida glabrata NOT DETECTED NOT DETECTED Final   Candida krusei NOT DETECTED NOT DETECTED Final   Candida parapsilosis NOT DETECTED NOT DETECTED Final   Candida tropicalis NOT DETECTED NOT DETECTED Final   Cryptococcus neoformans/gattii NOT DETECTED NOT DETECTED Final   CTX-M ESBL NOT DETECTED NOT DETECTED Final   Carbapenem resistance IMP NOT DETECTED NOT DETECTED Final   Carbapenem resistance KPC NOT DETECTED NOT DETECTED Final   Carbapenem resistance NDM NOT DETECTED NOT DETECTED Final   Carbapenem resist OXA 48 LIKE NOT DETECTED NOT DETECTED Final   Carbapenem resistance VIM NOT DETECTED NOT DETECTED Final    Comment: Performed at [place], [address] [place], [place] [address]  Urine Culture  Status: Abnormal   Collection Time: 08/29/21  4:00 AM   Specimen: Urine, Clean Catch  Result Value Ref Range Status   Specimen Description URINE, CLEAN CATCH  Final   Special Requests NONE  Final   Culture (A)  Final    <10,000 COLONIES/mL INSIGNIFICANT GROWTH Performed at [facility], [address], [place], [place] [address]    Report Status 08/30/2021 FINAL  Final     Labs: Basic Metabolic Panel: Recent Labs  Lab 08/28/21 0929 08/28/21 1213 08/29/21 0117 08/30/21 0329 08/31/21 0105  NA 137 138 136 138 139  K 3.8 3.8 4.8 4.6 4.7  CL 92*  --  91* 95* 96*  CO2 39*  --  37* 37* 35*  GLUCOSE 109*  --  136* 151* 180*  BUN 27*  --  30* 27* 24*  CREATININE 1.51*  --  1.46* 1.19* 1.09*  CALCIUM 9.3  --  8.9 9.0 9.1   Liver Function Tests: Recent Labs  Lab 08/28/21 1127 08/30/21 0329  AST 16 11*  ALT 13 12  ALKPHOS 130* 84  BILITOT 0.8 0.4  PROT 5.9* 5.6*  ALBUMIN 2.1* 1.8*   No results for input(s): LIPASE, AMYLASE in the last 168 hours. Recent Labs  Lab 08/28/21 1134  AMMONIA <10   CBC: Recent Labs  Lab 08/28/21 0929 08/28/21 1213 08/29/21 0117 08/30/21 0329 08/31/21 0105  WBC 13.1*  --  12.8* 11.3* 7.4  HGB 8.0* 9.2* 7.5* 7.6* 7.6*  HCT 28.1* 27.0* 26.1* 26.0* 26.2*  MCV 99.3  --  97.8 97.0 96.7  PLT 200  --  170 189 207   Cardiac Enzymes: No results for input(s): CKTOTAL, CKMB, CKMBINDEX, TROPONINI in the last 168 hours. BNP: BNP (last 3 results) Recent Labs    03/22/21 2325 07/23/21 2202 08/28/21 1133  BNP 48.0 147.2* 232.5*    ProBNP (last 3 results) No results for input(s): PROBNP in the last 8760 hours.  CBG: No results for input(s): GLUCAP in the last 168 hours.     Signed:  Oswald Hillock MD.  [facility] 08/31/2021, 1:19 PM

## 2021-08-31 NOTE — Progress Notes (Signed)
Pt was struggling with discomfort from BIPAP mask.  Patient wears CPAP at home.  Transitioned pt to CPAP same as home regimen. 4 lpm oxygen bleed in.  Patient tolerating well at this time.

## 2021-09-01 ENCOUNTER — Ambulatory Visit: Payer: Medicare Other | Admitting: Radiation Oncology

## 2021-09-01 DIAGNOSIS — C3412 Malignant neoplasm of upper lobe, left bronchus or lung: Secondary | ICD-10-CM | POA: Diagnosis not present

## 2021-09-01 DIAGNOSIS — I110 Hypertensive heart disease with heart failure: Secondary | ICD-10-CM | POA: Diagnosis not present

## 2021-09-01 DIAGNOSIS — J9621 Acute and chronic respiratory failure with hypoxia: Secondary | ICD-10-CM | POA: Diagnosis not present

## 2021-09-01 DIAGNOSIS — J9622 Acute and chronic respiratory failure with hypercapnia: Secondary | ICD-10-CM | POA: Diagnosis not present

## 2021-09-01 DIAGNOSIS — J449 Chronic obstructive pulmonary disease, unspecified: Secondary | ICD-10-CM | POA: Diagnosis not present

## 2021-09-01 DIAGNOSIS — D630 Anemia in neoplastic disease: Secondary | ICD-10-CM | POA: Diagnosis not present

## 2021-09-02 ENCOUNTER — Other Ambulatory Visit: Payer: Self-pay

## 2021-09-02 ENCOUNTER — Ambulatory Visit (HOSPITAL_COMMUNITY)
Admission: RE | Admit: 2021-09-02 | Discharge: 2021-09-02 | Disposition: A | Discharge status: Home / Self Care | Payer: Medicare Other | Source: Ambulatory Visit | Attending: Radiation Oncology | Admitting: Radiation Oncology

## 2021-09-02 DIAGNOSIS — D490 Neoplasm of unspecified behavior of digestive system: Secondary | ICD-10-CM | POA: Insufficient documentation

## 2021-09-02 DIAGNOSIS — D638 Anemia in other chronic diseases classified elsewhere: Secondary | ICD-10-CM | POA: Diagnosis not present

## 2021-09-02 DIAGNOSIS — I2510 Atherosclerotic heart disease of native coronary artery without angina pectoris: Secondary | ICD-10-CM | POA: Diagnosis not present

## 2021-09-02 DIAGNOSIS — C342 Malignant neoplasm of middle lobe, bronchus or lung: Secondary | ICD-10-CM | POA: Insufficient documentation

## 2021-09-02 DIAGNOSIS — E890 Postprocedural hypothyroidism: Secondary | ICD-10-CM | POA: Diagnosis not present

## 2021-09-02 DIAGNOSIS — N183 Chronic kidney disease, stage 3 unspecified: Secondary | ICD-10-CM | POA: Diagnosis not present

## 2021-09-02 DIAGNOSIS — R918 Other nonspecific abnormal finding of lung field: Secondary | ICD-10-CM | POA: Diagnosis present

## 2021-09-02 DIAGNOSIS — I129 Hypertensive chronic kidney disease with stage 1 through stage 4 chronic kidney disease, or unspecified chronic kidney disease: Secondary | ICD-10-CM | POA: Diagnosis not present

## 2021-09-02 DIAGNOSIS — R911 Solitary pulmonary nodule: Secondary | ICD-10-CM | POA: Diagnosis not present

## 2021-09-02 DIAGNOSIS — K5730 Diverticulosis of large intestine without perforation or abscess without bleeding: Secondary | ICD-10-CM | POA: Diagnosis not present

## 2021-09-02 DIAGNOSIS — J432 Centrilobular emphysema: Secondary | ICD-10-CM | POA: Diagnosis not present

## 2021-09-02 DIAGNOSIS — I700 Atherosclerosis of aorta: Secondary | ICD-10-CM | POA: Diagnosis not present

## 2021-09-02 DIAGNOSIS — J449 Chronic obstructive pulmonary disease, unspecified: Secondary | ICD-10-CM | POA: Diagnosis not present

## 2021-09-02 DIAGNOSIS — D509 Iron deficiency anemia, unspecified: Secondary | ICD-10-CM | POA: Diagnosis not present

## 2021-09-02 DIAGNOSIS — G4700 Insomnia, unspecified: Secondary | ICD-10-CM | POA: Diagnosis not present

## 2021-09-02 DIAGNOSIS — E785 Hyperlipidemia, unspecified: Secondary | ICD-10-CM | POA: Diagnosis not present

## 2021-09-02 LAB — GLUCOSE, CAPILLARY: Glucose-Capillary: 84 mg/dL (ref 70–99)

## 2021-09-02 IMAGING — PT NM PET TUM IMG INITIAL (PI) SKULL BASE T - THIGH
8 series · 25 of 25 positions shown · non-contrast
Comparison: CT on [DATE]

CLINICAL DATA: Initial treatment strategy for right lung nodule.

EXAM:
NUCLEAR MEDICINE PET SKULL BASE TO THIGH
TECHNIQUE: 6.8 mCi F-18 FDG was injected intravenously. Full-ring PET imaging
was performed from the skull base to thigh after the radiotracer. CT
data was obtained and used for attenuation correction and anatomic
localization.
Fasting blood glucose: 84 mg/dl

[Series 3: pet sk_thigh ac · axial · 5.0mm · 4.07mm/px · z∈[-1644,-800]mm · 6 of 212 slices shown]
[im 1/212]
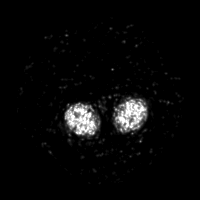
[im 43/212]
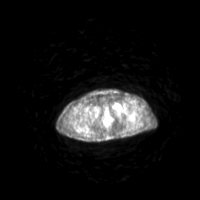
[im 85/212]
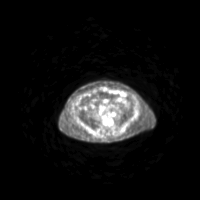
[im 127/212]
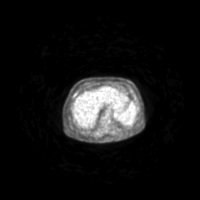
[im 169/212]
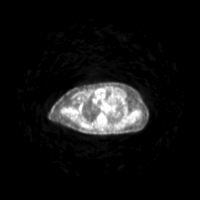
[im 212/212]
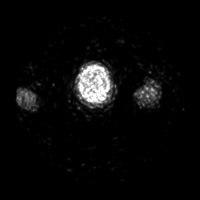

[Series 4: ct sk_thigh 5.0 bf37 · axial · 5.0mm · 0.98mm/px · z∈[-1644,-800]mm · 5 of 212 slices shown]
[im 1/212]
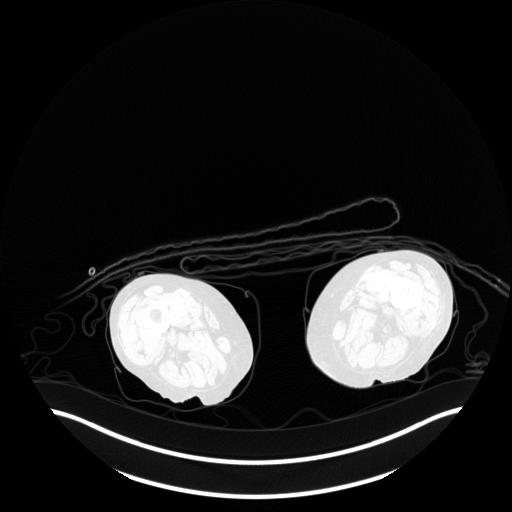
[im 53/212]
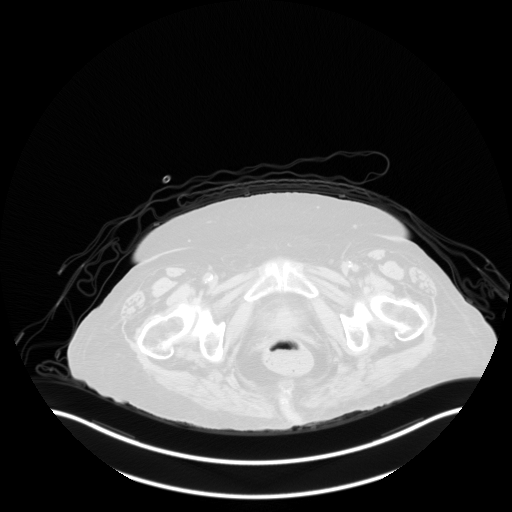
[im 106/212]
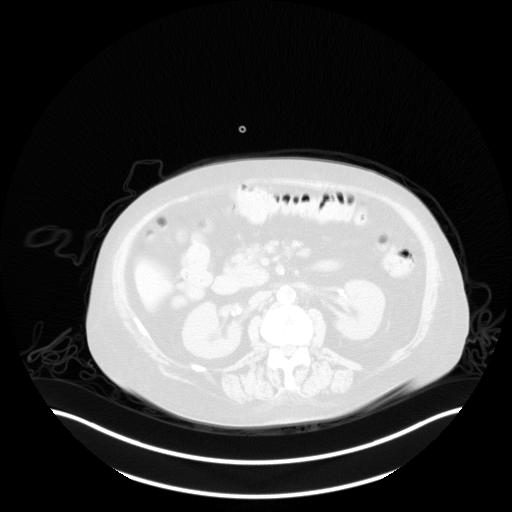
[im 159/212]
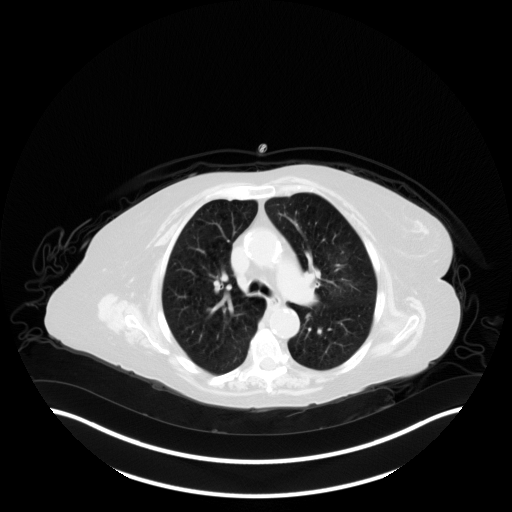
[im 212/212  brain]
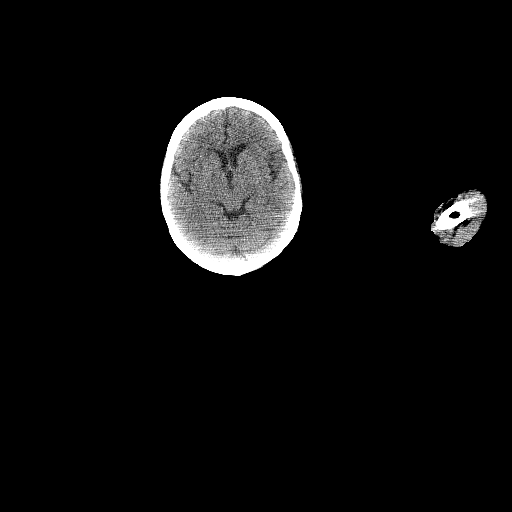

[Series 5: pet sk_thigh nac · axial · 5.0mm · 4.07mm/px · z∈[-1644,-800]mm · 5 of 212 slices shown]
[im 1/212]
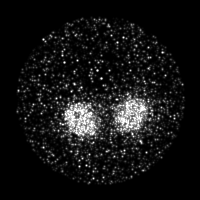
[im 53/212]
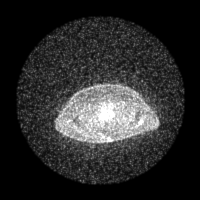
[im 106/212]
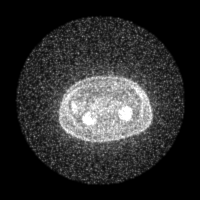
[im 159/212]
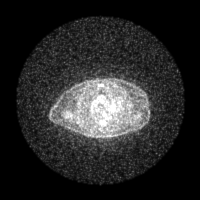
[im 212/212]
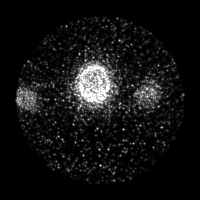

[Series 8: ct sk_thigh 5.0 br59 lung_bone · axial · 5.0mm · 0.66mm/px · 1 of 59 slices shown]
[im 1/59]
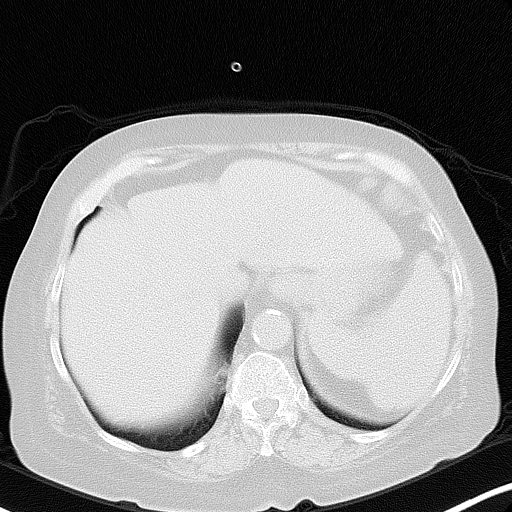

[Series 603: <mip collection> · coronal · 1.75mm/px · 1 of 32 slices shown]
[im 1/32]
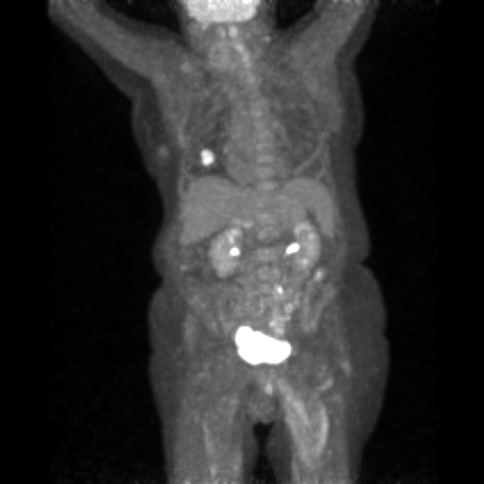

[Series 604: fused cor · 1 of 51 slices shown]
[im 1/51]
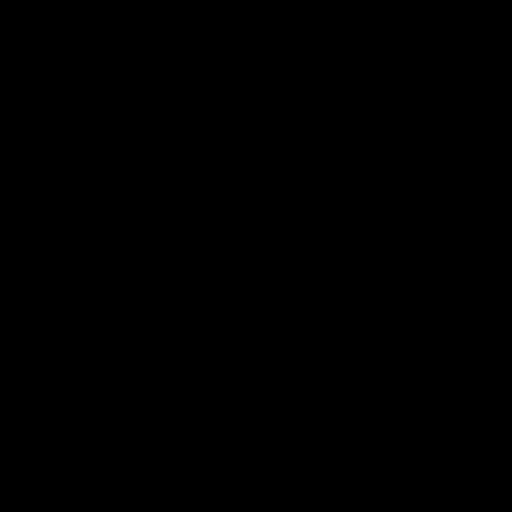

[Series 605: range-ct sk_thigh 5.0 bf37-tra-<alpha range> · 5 of 207 slices shown]
[im 1/207]
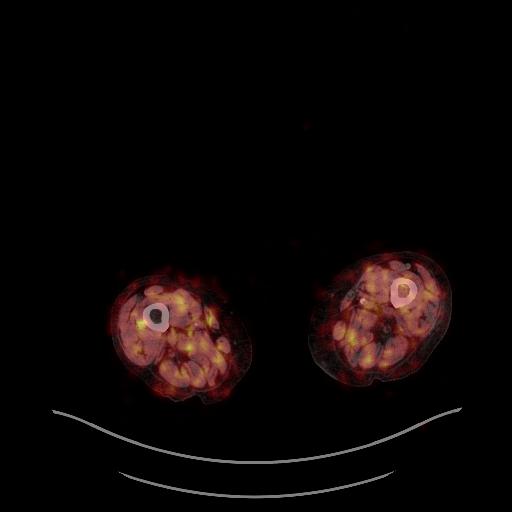
[im 52/207]
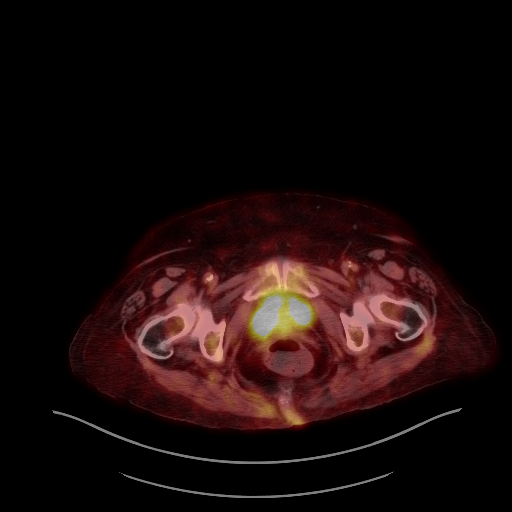
[im 104/207]
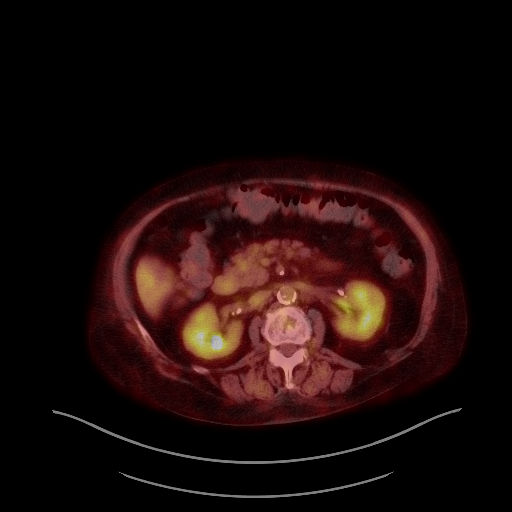
[im 155/207]
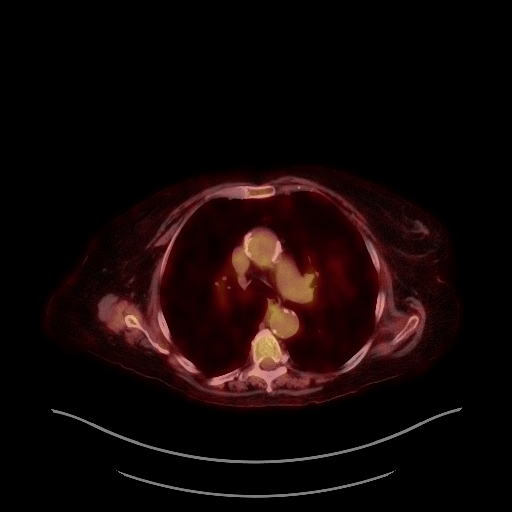
[im 207/207]
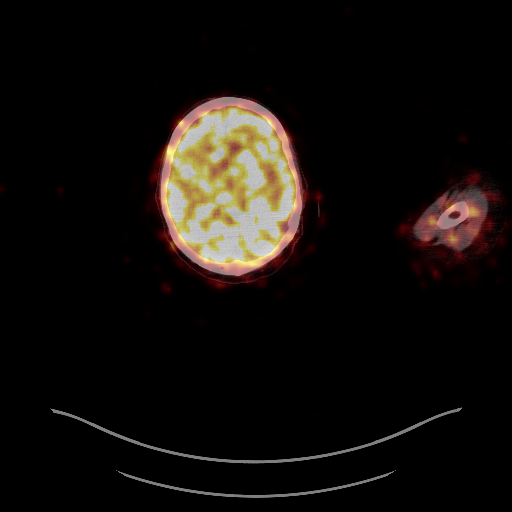

[Series 1116: results mm oncology reading · 1.0mm · 1.02mm/px · 1 of 4 slices shown]
[im 1/4]
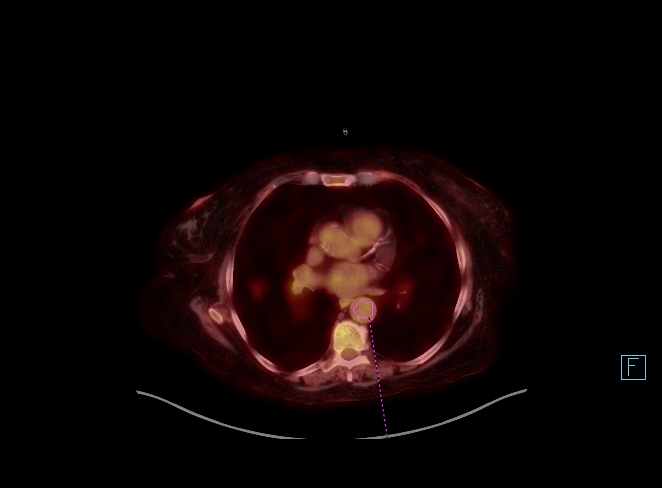

[25 of 25 positions shown; findings below may reference images not displayed]

FINDINGS: Mediastinal blood-pool activity (background): SUV max =

Liver activity (reference): SUV max = N/A

NECK: No hypermetabolic lymph nodes. A 6 mm nodule is seen in the
posterior left parotid gland which shows mild hypermetabolism, with
SUV max of 3.0. This is consistent with a primary salivary gland
tumor.

Incidental CT findings:  None.

CHEST: A pulmonary nodule is seen in the posterior right middle lobe
which measures 2.4 x 2.1 cm on image 41/8. This is hypermetabolic,
with SUV max of 13.3. No other suspicious pulmonary nodules
identified.

A 6 mm pretracheal mediastinal lymph node is seen on image 48/,
which shows mild FDG uptake with SUV max of 3.4. No other
hypermetabolic nodes within thorax.

Incidental CT findings: Moderate centrilobular emphysema. Aortic and
coronary atherosclerotic calcification noted.

ABDOMEN/PELVIS: No abnormal hypermetabolic activity within the
liver, pancreas, adrenal glands, or spleen. No hypermetabolic lymph
nodes in the abdomen or pelvis.

Incidental CT findings: Diverticulosis is seen mainly involving the
descending and sigmoid colon, however there is no evidence of
diverticulitis. Prior hysterectomy noted. Aortic atherosclerotic
calcification noted.

SKELETON: No focal hypermetabolic bone lesions to suggest skeletal
metastasis.

Incidental CT findings:  None.
IMPRESSION: 2.4 cm hypermetabolic pulmonary nodule in the posterior right middle
lobe, consistent with primary bronchogenic carcinoma.

6 mm pretracheal mediastinal lymph node shows mild hypermetabolism.
Metastatic disease cannot be excluded.

No other sites of metastatic disease identified.

6 mm nodule in the posterior left parotid gland with mild
hypermetabolism, consistent with primary salivary gland tumor.

## 2021-09-02 MED ORDER — FLUDEOXYGLUCOSE F - 18 (FDG) INJECTION
7.2000 | Freq: Once | INTRAVENOUS | Status: AC
  Administered 2021-09-02: 11:00:00 6.8 via INTRAVENOUS

## 2021-09-03 DIAGNOSIS — Z9981 Dependence on supplemental oxygen: Secondary | ICD-10-CM | POA: Diagnosis not present

## 2021-09-03 DIAGNOSIS — Z7952 Long term (current) use of systemic steroids: Secondary | ICD-10-CM | POA: Diagnosis not present

## 2021-09-03 DIAGNOSIS — Z8616 Personal history of COVID-19: Secondary | ICD-10-CM | POA: Diagnosis not present

## 2021-09-03 DIAGNOSIS — J449 Chronic obstructive pulmonary disease, unspecified: Secondary | ICD-10-CM | POA: Diagnosis not present

## 2021-09-03 DIAGNOSIS — F411 Generalized anxiety disorder: Secondary | ICD-10-CM | POA: Diagnosis not present

## 2021-09-03 DIAGNOSIS — D630 Anemia in neoplastic disease: Secondary | ICD-10-CM | POA: Diagnosis not present

## 2021-09-03 DIAGNOSIS — I081 Rheumatic disorders of both mitral and tricuspid valves: Secondary | ICD-10-CM | POA: Diagnosis not present

## 2021-09-03 DIAGNOSIS — I5033 Acute on chronic diastolic (congestive) heart failure: Secondary | ICD-10-CM | POA: Diagnosis not present

## 2021-09-03 DIAGNOSIS — R5383 Other fatigue: Secondary | ICD-10-CM | POA: Diagnosis not present

## 2021-09-03 DIAGNOSIS — N189 Chronic kidney disease, unspecified: Secondary | ICD-10-CM | POA: Diagnosis not present

## 2021-09-03 DIAGNOSIS — C3412 Malignant neoplasm of upper lobe, left bronchus or lung: Secondary | ICD-10-CM | POA: Diagnosis not present

## 2021-09-03 DIAGNOSIS — N390 Urinary tract infection, site not specified: Secondary | ICD-10-CM | POA: Diagnosis not present

## 2021-09-03 DIAGNOSIS — D638 Anemia in other chronic diseases classified elsewhere: Secondary | ICD-10-CM | POA: Diagnosis not present

## 2021-09-03 DIAGNOSIS — N179 Acute kidney failure, unspecified: Secondary | ICD-10-CM | POA: Diagnosis not present

## 2021-09-03 DIAGNOSIS — E782 Mixed hyperlipidemia: Secondary | ICD-10-CM | POA: Diagnosis not present

## 2021-09-03 DIAGNOSIS — R918 Other nonspecific abnormal finding of lung field: Secondary | ICD-10-CM | POA: Diagnosis not present

## 2021-09-03 DIAGNOSIS — D631 Anemia in chronic kidney disease: Secondary | ICD-10-CM | POA: Diagnosis not present

## 2021-09-03 DIAGNOSIS — E039 Hypothyroidism, unspecified: Secondary | ICD-10-CM | POA: Diagnosis not present

## 2021-09-03 DIAGNOSIS — G4733 Obstructive sleep apnea (adult) (pediatric): Secondary | ICD-10-CM | POA: Diagnosis not present

## 2021-09-03 DIAGNOSIS — I130 Hypertensive heart and chronic kidney disease with heart failure and stage 1 through stage 4 chronic kidney disease, or unspecified chronic kidney disease: Secondary | ICD-10-CM | POA: Diagnosis not present

## 2021-09-03 DIAGNOSIS — Z85850 Personal history of malignant neoplasm of thyroid: Secondary | ICD-10-CM | POA: Diagnosis not present

## 2021-09-03 DIAGNOSIS — K259 Gastric ulcer, unspecified as acute or chronic, without hemorrhage or perforation: Secondary | ICD-10-CM | POA: Diagnosis not present

## 2021-09-03 DIAGNOSIS — J9611 Chronic respiratory failure with hypoxia: Secondary | ICD-10-CM | POA: Diagnosis not present

## 2021-09-03 DIAGNOSIS — J9621 Acute and chronic respiratory failure with hypoxia: Secondary | ICD-10-CM | POA: Diagnosis not present

## 2021-09-03 DIAGNOSIS — K219 Gastro-esophageal reflux disease without esophagitis: Secondary | ICD-10-CM | POA: Diagnosis not present

## 2021-09-03 DIAGNOSIS — J9622 Acute and chronic respiratory failure with hypercapnia: Secondary | ICD-10-CM | POA: Diagnosis not present

## 2021-09-03 DIAGNOSIS — B9620 Unspecified Escherichia coli [E. coli] as the cause of diseases classified elsewhere: Secondary | ICD-10-CM | POA: Diagnosis not present

## 2021-09-03 DIAGNOSIS — G9341 Metabolic encephalopathy: Secondary | ICD-10-CM | POA: Diagnosis not present

## 2021-09-03 DIAGNOSIS — I739 Peripheral vascular disease, unspecified: Secondary | ICD-10-CM | POA: Diagnosis not present

## 2021-09-03 DIAGNOSIS — Z7951 Long term (current) use of inhaled steroids: Secondary | ICD-10-CM | POA: Diagnosis not present

## 2021-09-06 ENCOUNTER — Encounter: Payer: Self-pay | Admitting: Radiation Oncology

## 2021-09-06 ENCOUNTER — Ambulatory Visit
Admission: RE | Admit: 2021-09-06 | Discharge: 2021-09-06 | Disposition: A | Discharge status: Home / Self Care | Payer: Medicare Other | Source: Ambulatory Visit | Attending: Radiation Oncology | Admitting: Radiation Oncology

## 2021-09-06 DIAGNOSIS — C3412 Malignant neoplasm of upper lobe, left bronchus or lung: Secondary | ICD-10-CM

## 2021-09-06 DIAGNOSIS — C342 Malignant neoplasm of middle lobe, bronchus or lung: Secondary | ICD-10-CM

## 2021-09-06 DIAGNOSIS — Z08 Encounter for follow-up examination after completed treatment for malignant neoplasm: Secondary | ICD-10-CM | POA: Diagnosis not present

## 2021-09-06 DIAGNOSIS — K118 Other diseases of salivary glands: Secondary | ICD-10-CM

## 2021-09-06 NOTE — Progress Notes (Signed)
Patient states doing well. No symptoms reported at this time.  Meaningful use complete.  Patient notified of 1:30pm-09/06/21 telephone appointment and verbalized understanding.  Patient preferred contact # [phone]

## 2021-09-06 NOTE — Progress Notes (Addendum)
Radiation Oncology         [phone] ________________________________  Outpatient Follow Up - Conducted via telephone due to current COVID-19 concerns for limiting patient exposure  I spoke with the patient to conduct this consult visit via telephone to spare the patient unnecessary potential exposure in the healthcare setting during the current COVID-19 pandemic. The patient was notified in advance and was offered a [facility] meeting to allow for face to face communication but unfortunately reported that they did not have the appropriate resources/technology to support such a visit and instead preferred to proceed with a telephone visit.   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 Now with Putative Stage IA3, cT1cN0M0, NSCLC of the RML  Prior Radiation:  06/02/20-06/09/20 SBRT Treatment: The separate LUL target different from 2020 treatment was treated to 54 Gy in 3 fractions.  07/30/2019-08/06/2019 SBRT Treatment: The LUL target was treated to 54 Gy in 3 fractions.    Narrative:  Holly [name] is a pleasant 75 y.o. female originally seen at the request of Dr. Kipp Brood with a history of COPD, oxygen dependant at 2L [place] for several years. She was previously a patient of Dr. Lake Bells, but following pneumonia that required admission at [facility] about 2 years ago when she started needing O2, she continued with Dr. Alcide Clever.  CT imaging in July 2020 revealed a 12 x 8 mm left upper lobe nodule and a nodule in the RML measuring 13 x 7 mm. She underwent a PET scan on 06/05/2019 revealed hypermetabolic change with an SUV of 8.5 in the LUL and this measured 12 mm, and in the RML the nodule was 13 mm, and had an SUV of 1.4. She did not have any hypermetabolic adenopathy. A left adrenal adenoma was  noted as well and was not hypermetabolic. She underwent navigational bronchoscopy on 06/25/2019 with Dr. Kipp Brood and a biopsy and brushing of the LUL revealed squamous cell carcinoma. The RML lesion was also sampled, and atypical cells were noted but not diagnostic of cancer. She was discussed in [facility] conference and she did not appear to be a good candidate for surgical resection, and rather that she would benefit from SBRT to the LUL and probably follow the RML with serial imaging prior to intervening with treatment.  She did proceed with stereotactic body radiotherapy which she completed on 08/06/2019 to the left upper lobe.  Her RML nodule has wax and waned over time, but a change in a separate LUL nodule progressed and she was offered another course of SBRT as this was a separate nodule. She received this treatment in August 2021.   Recent imaging about a month ago showed increase in the size of the right middle lobe nodule, and Dr. Lisbeth Renshaw recommended proceeding with a PET scan. This was performed on 09/02/2021 and showed a 2.4 cm hypermetabolic nodule in the posterior right middle lobe consistent with a new primary lung cancer.  A 6 mm pretracheal mediastinal node showed mild hypermetabolism but the blood pool was 2.7 and the uptake of the pretracheal node was 3.4, also in context the patient was hospitalized less than a week ago due to sepsis from a UTI she was hypercapnic based on her ABGs and did use BiPAP initially.  She was back to her baseline of 3 L  of minute of oxygen at the time of her discharge on 08/31/2021.  She was sent home with Ciprofloxacin.  On review of systems, the patient reports that she is doing fairly well and back to using her O2 at 3L. She denies any other concerns with her breathing. No other complaints are verbalized.    Past Medical History:  Past Medical History:  Diagnosis Date   Anxiety    Asthma    Basal cell carcinoma    lung cancer.  Skin cancer- basal - nose  removed   Bruises easily    Cataract    Cervicalgia    COPD (chronic obstructive pulmonary disease) (HCC)    emphysema and COPD   Dyspnea    uses O2 only when needed. Uses 2 L    Foot swelling    left- has shown to Drs.   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   Hypercalcemia    Hypertension    Hypothyroidism    Mixed hyperlipidemia    Pneumonia    Pulmonary nodule    Sleep apnea    does not wear cpap   Thyroid neoplasm    Urinary incontinence    Wears dentures    Wears glasses     Past Surgical History: Past Surgical History:  Procedure Laterality Date   BIOPSY  02/17/2021   Procedure: BIOPSY;  Surgeon: Otis Brace, MD;  Location: [facility];  Service: Gastroenterology;;   COLONOSCOPY     DENTAL SURGERY     ELBOW ARTHROPLASTY Right 2020   ESOPHAGOGASTRODUODENOSCOPY (EGD) WITH PROPOFOL N/A 02/17/2021   Procedure: ESOPHAGOGASTRODUODENOSCOPY (EGD) WITH PROPOFOL;  Surgeon: Otis Brace, MD;  Location: [facility];  Service: Gastroenterology;  Laterality: N/A;   EYE SURGERY Right    cataract surgery   FOOT SURGERY  30 YRS AGO   BILATERAL   HARDWARE REMOVAL Right 10/02/2020   Procedure: HARDWARE REMOVAL RIGHT ELBOW;  Surgeon: Shona Needles, MD;  Location: [facility];  Service: Orthopedics;  Laterality: Right;   I & D EXTREMITY Right 01/13/2021   Procedure: IRRIGATION AND DEBRIDEMENT RIGHT ELBOW;  Surgeon: Shona Needles, MD;  Location: [facility];  Service: Orthopedics;  Laterality: Right;   ORIF ELBOW FRACTURE Right 12/06/2017   Procedure: OPEN REDUCTION INTERNAL FIXATION (ORIF) ELBOW/OLECRANON FRACTURE;  Surgeon: Shona Needles, MD;  Location: [facility];  Service: Orthopedics;  Laterality: Right;   SKIN CANCER EXCISION     THYROIDECTOMY N/A 07/31/2014   Procedure: TOTAL THYROIDECTOMY;  Surgeon: Armandina Gemma, MD;  Location: [facility];  Service: General;  Laterality: N/A;   VAGINAL HYSTERECTOMY     VIDEO BRONCHOSCOPY WITH  ENDOBRONCHIAL NAVIGATION Bilateral 06/25/2019   Procedure: VIDEO BRONCHOSCOPY WITH ENDOBRONCHIAL NAVIGATION;  Surgeon: Lajuana Matte, MD;  Location: [facility];  Service: Thoracic;  Laterality: Bilateral;    Social History:  Social History   Socioeconomic History   Marital status: Widowed    Spouse name: Not on file   Number of children: 2   Years of education: Not on file   Highest education level: Not on file  Occupational History   Occupation: [employer]  Tobacco Use   Smoking status: Former    Packs/day: 1.00    Years: 30.00    Pack years: 30.00    Types: Cigarettes    Quit date: 02/24/2015    Years since quitting: 6.5   Smokeless tobacco: Never  Vaping Use  Vaping Use: Former  Substance and Sexual Activity   Alcohol use: Yes    Alcohol/week: 0.0 standard drinks    Comment: very seldom   Drug use: Never   Sexual activity: Not Currently  Other Topics Concern   Not on file  Social History Narrative   Not on file   Social Determinants of Health   Financial Resource Strain: Not on file  Food Insecurity: Not on file  Transportation Needs: Not on file  Physical Activity: Not on file  Stress: Not on file  Social Connections: Not on file  Intimate Partner Violence: Not on file  The patient is widowed and lives in [place] in [place].  Family History: Family History  Problem Relation Age of Onset   Coronary artery disease Father    Hypertension Father    Asthma Father    Allergies Father    Hypertension Mother    Lung cancer Mother      Medications: Current Outpatient Medications  Medication Sig Dispense Refill   acetaminophen (TYLENOL) 325 MG tablet Take 650 mg by mouth every 6 (six) hours as needed for mild pain.     ALPRAZolam (XANAX) 0.5 MG tablet Take 0.5 mg by mouth 2 (two) times daily as needed for anxiety.     atorvastatin (LIPITOR) 20 MG tablet Take 20 mg by mouth at bedtime.      buPROPion (WELLBUTRIN XL) 300 MG 24 hr tablet Take 300  mg by mouth every morning.     busPIRone (BUSPAR) 5 MG tablet Take 1 tablet (5 mg total) by mouth 3 (three) times daily. 30 tablet 3   Docusate Sodium (COLACE PO) Take 1-2 tablets by mouth daily.     donepezil (ARICEPT) 10 MG tablet Take 10 mg by mouth at bedtime.     Fluticasone-Umeclidin-Vilant 100-62.5-25 MCG/INH AEPB Inhale 1 puff into the lungs daily. Trelegy     furosemide (LASIX) 20 MG tablet Take 1 tablet (20 mg total) by mouth every other day. 30 tablet 3   levothyroxine (SYNTHROID) 137 MCG tablet Take 137 mcg by mouth daily before breakfast.     losartan (COZAAR) 50 MG tablet Take 1 tablet (50 mg total) by mouth daily. 30 tablet 3   metoprolol succinate (TOPROL-XL) 50 MG 24 hr tablet Take 50 mg by mouth daily. Take with or immediately following a meal.     ondansetron (ZOFRAN) 4 MG tablet Take 1 tablet (4 mg total) by mouth every 8 (eight) hours as needed for nausea or vomiting. 20 tablet 0   pantoprazole (PROTONIX) 40 MG tablet Take 1 tablet (40 mg total) by mouth daily. 30 tablet 3   polyethylene glycol (MIRALAX / GLYCOLAX) 17 g packet Take 17 g by mouth daily as needed for moderate constipation. (Patient not taking: Reported on 08/28/2021) 14 each 0   predniSONE (DELTASONE) 20 MG tablet Take 40 mg by mouth daily. Take 40mg  daily for five days (08/25/21-08/29/21) then go back to 20mg  daily continuous.     traMADol (ULTRAM) 50 MG tablet Take 50 mg by mouth every 6 (six) hours as needed for moderate pain.     VENTOLIN HFA 108 (90 Base) MCG/ACT inhaler INHALE 2 PUFFS INTO THE LUNGS EVERY 4 HOURS AS NEEDED FOR WHEEZING ORSHORTNESS OF BREATH (Patient taking differently: Inhale 2 puffs into the lungs every 4 (four) hours as needed for wheezing or shortness of breath.) 8 g 0   No current facility-administered medications for this encounter.     Allergies:  Allergies  Allergen Reactions   Amlodipine Besylate Swelling    Edema   Codeine Nausea And Vomiting   Oxycodone-Acetaminophen  Nausea And Vomiting   Physical Exam: Unable to assess due to encounter type.  Impression/Plan: 1.         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 The patient's recent imaging regarding her LUL is stable and shows post treatment changes without progressive concerns. 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 Her case was reviewed with Dr. [name] and he has recommended SBRT in 3-5 fractions as well as closely following her mediastinal node.   We discussed the risks, benefits, short, and long term effects of radiotherapy, as well as the curative intent, and the patient is interested in proceeding. She will receive a call to coordinate simulation and at that time she will sign written consent to proceed.  2. Left parotid lesion on PET. I recommended she meet with ENT regarding this and we have referred her. She will get a call as well for evaluation. 3. O2 dependant COPD. The patient will continue under the care of  Dr. Alcide Clever in [place].  I left a voicemail for him today to discuss her case as well.    Given current concerns for patient exposure during the COVID-19 pandemic, this encounter was conducted via telephone.  The patient has provided two factor identification and has given verbal consent for this type of encounter and has been advised to only accept a meeting of this type in a secure network environment. The time spent during this encounter was 45 minutes including preparation, discussion, and coordination of the patient's care. The attendants for this meeting include Hayden Pedro  and Marcelyn Ditty and [facility]. During the encounter, Hayden Pedro were located at [facility] Radiation Oncology Department.  Holly [name] was located at home and her daughter Holly [name] was also available on the call.    Carola Rhine, PAC

## 2021-09-07 ENCOUNTER — Encounter: Payer: Self-pay | Admitting: *Deleted

## 2021-09-07 ENCOUNTER — Telehealth: Payer: Self-pay | Admitting: *Deleted

## 2021-09-07 DIAGNOSIS — J9621 Acute and chronic respiratory failure with hypoxia: Secondary | ICD-10-CM | POA: Diagnosis not present

## 2021-09-07 DIAGNOSIS — B9620 Unspecified Escherichia coli [E. coli] as the cause of diseases classified elsewhere: Secondary | ICD-10-CM | POA: Diagnosis not present

## 2021-09-07 DIAGNOSIS — C3412 Malignant neoplasm of upper lobe, left bronchus or lung: Secondary | ICD-10-CM | POA: Diagnosis not present

## 2021-09-07 DIAGNOSIS — N390 Urinary tract infection, site not specified: Secondary | ICD-10-CM | POA: Diagnosis not present

## 2021-09-07 DIAGNOSIS — J9622 Acute and chronic respiratory failure with hypercapnia: Secondary | ICD-10-CM | POA: Diagnosis not present

## 2021-09-07 DIAGNOSIS — J449 Chronic obstructive pulmonary disease, unspecified: Secondary | ICD-10-CM | POA: Diagnosis not present

## 2021-09-07 NOTE — Telephone Encounter (Signed)
CALLED PATIENT TO INFORM OF APPT. WITH DR. BATES ON 09-30-21- ARRIVAL TIME- 9:40 AM , ADDRESS- [place], PH. NO. - [phone], LVM THAT I WOULD CALL BACK

## 2021-09-07 NOTE — Progress Notes (Signed)
[facility] CSW Psychosocial Distress Screening  Social Work was referred by distress screening protocol.  The patient scored a 6 on the Psychosocial Distress Thermometer which indicates moderate distress. Social Work Theatre manager contacted patient by phone to assess for distress and other psychosocial needs.   Patient had company and asked for a call back later in the day. Intern will call back.  ONCBCN DISTRESS SCREENING 09/06/2021  Screening Type   Distress experienced in past week (1-10) 6  Family Problem type   Emotional problem type Adjusting to illness;Nervousness/Anxiety    Rosary Lively, Social Work Intern Supervised by Gwinda Maine, LCSW

## 2021-09-07 NOTE — Progress Notes (Signed)
[facility] CSW Psychosocial Distress Screening   Social Work was referred by distress screening protocol.  The patient scored a 6 on the Psychosocial Distress Thermometer which indicates moderate distress. Social Work Theatre manager contacted patient by phone (2nd attempt) to assess for distress and other psychosocial needs.    Patient reports that her anxiety is still high, although all of her questions were answered thoroughly by Shona Simpson at her appointment yesterday. Intern and patient discussed common feeling and emotions when being diagnosed with cancer, and Intern informed patient of the support team and support services at [facility].  Pt was interested in monthly support group so intern shared dates and phone number to register.   Patient denied any resource/ financial needs at this time, though she said she may need transportation services in the future and will reach out to CSW if so. Currently her daughter (who lives with pt) brings pt to appointments and wants to be there, but she works and may have occasional conflict.  Intern provided contact information and encouraged patient to call with any questions or concerns.   ONCBCN DISTRESS SCREENING 09/06/2021  Screening Type    Distress experienced in past week (1-10) 6  Family Problem type    Emotional problem type Adjusting to illness;Nervousness/Anxiety      Holly [name], Social Work Intern Supervised by Gwinda Maine, LCSW

## 2021-09-07 NOTE — Addendum Note (Signed)
Encounter addended by: Hayden Pedro, PA-C on: 09/07/2021 11:44 AM  Actions taken: Clinical Note Signed

## 2021-09-14 DIAGNOSIS — R7881 Bacteremia: Secondary | ICD-10-CM | POA: Diagnosis not present

## 2021-09-14 DIAGNOSIS — G4700 Insomnia, unspecified: Secondary | ICD-10-CM | POA: Diagnosis not present

## 2021-09-14 DIAGNOSIS — J449 Chronic obstructive pulmonary disease, unspecified: Secondary | ICD-10-CM | POA: Diagnosis not present

## 2021-09-14 DIAGNOSIS — N179 Acute kidney failure, unspecified: Secondary | ICD-10-CM | POA: Diagnosis not present

## 2021-09-14 DIAGNOSIS — D638 Anemia in other chronic diseases classified elsewhere: Secondary | ICD-10-CM | POA: Diagnosis not present

## 2021-09-14 DIAGNOSIS — C3491 Malignant neoplasm of unspecified part of right bronchus or lung: Secondary | ICD-10-CM | POA: Diagnosis not present

## 2021-09-14 DIAGNOSIS — R7301 Impaired fasting glucose: Secondary | ICD-10-CM | POA: Diagnosis not present

## 2021-09-14 DIAGNOSIS — J9611 Chronic respiratory failure with hypoxia: Secondary | ICD-10-CM | POA: Diagnosis not present

## 2021-09-14 DIAGNOSIS — M25562 Pain in left knee: Secondary | ICD-10-CM | POA: Diagnosis not present

## 2021-09-14 DIAGNOSIS — F419 Anxiety disorder, unspecified: Secondary | ICD-10-CM | POA: Diagnosis not present

## 2021-09-14 DIAGNOSIS — I5033 Acute on chronic diastolic (congestive) heart failure: Secondary | ICD-10-CM | POA: Diagnosis not present

## 2021-09-17 DIAGNOSIS — B9620 Unspecified Escherichia coli [E. coli] as the cause of diseases classified elsewhere: Secondary | ICD-10-CM | POA: Diagnosis not present

## 2021-09-17 DIAGNOSIS — J9621 Acute and chronic respiratory failure with hypoxia: Secondary | ICD-10-CM | POA: Diagnosis not present

## 2021-09-17 DIAGNOSIS — C3412 Malignant neoplasm of upper lobe, left bronchus or lung: Secondary | ICD-10-CM | POA: Diagnosis not present

## 2021-09-17 DIAGNOSIS — J9622 Acute and chronic respiratory failure with hypercapnia: Secondary | ICD-10-CM | POA: Diagnosis not present

## 2021-09-17 DIAGNOSIS — N390 Urinary tract infection, site not specified: Secondary | ICD-10-CM | POA: Diagnosis not present

## 2021-09-17 DIAGNOSIS — J449 Chronic obstructive pulmonary disease, unspecified: Secondary | ICD-10-CM | POA: Diagnosis not present

## 2021-09-21 ENCOUNTER — Telehealth: Payer: Self-pay | Admitting: *Deleted

## 2021-09-21 ENCOUNTER — Ambulatory Visit: Admission: RE | Admit: 2021-09-21 | Payer: Medicare Other | Source: Ambulatory Visit | Admitting: Radiation Oncology

## 2021-09-21 ENCOUNTER — Other Ambulatory Visit: Payer: Self-pay

## 2021-09-21 DIAGNOSIS — Z510 Encounter for antineoplastic radiation therapy: Secondary | ICD-10-CM | POA: Diagnosis not present

## 2021-09-21 DIAGNOSIS — C342 Malignant neoplasm of middle lobe, bronchus or lung: Secondary | ICD-10-CM | POA: Diagnosis not present

## 2021-09-21 DIAGNOSIS — C3412 Malignant neoplasm of upper lobe, left bronchus or lung: Secondary | ICD-10-CM | POA: Diagnosis not present

## 2021-09-21 DIAGNOSIS — M25562 Pain in left knee: Secondary | ICD-10-CM | POA: Diagnosis not present

## 2021-09-21 DIAGNOSIS — Z87891 Personal history of nicotine dependence: Secondary | ICD-10-CM | POA: Diagnosis not present

## 2021-09-21 NOTE — Telephone Encounter (Signed)
Patient to have an appt. with Dr. Redmond Baseman (ENT) on 09-30-21- arrival time- 9:45 am - address [address], ph. no. [phone], gave patient an appt. card in sim

## 2021-09-22 DIAGNOSIS — J9621 Acute and chronic respiratory failure with hypoxia: Secondary | ICD-10-CM | POA: Diagnosis not present

## 2021-09-22 DIAGNOSIS — J9622 Acute and chronic respiratory failure with hypercapnia: Secondary | ICD-10-CM | POA: Diagnosis not present

## 2021-09-22 DIAGNOSIS — C3412 Malignant neoplasm of upper lobe, left bronchus or lung: Secondary | ICD-10-CM | POA: Diagnosis not present

## 2021-09-22 DIAGNOSIS — N390 Urinary tract infection, site not specified: Secondary | ICD-10-CM | POA: Diagnosis not present

## 2021-09-22 DIAGNOSIS — B9620 Unspecified Escherichia coli [E. coli] as the cause of diseases classified elsewhere: Secondary | ICD-10-CM | POA: Diagnosis not present

## 2021-09-22 DIAGNOSIS — J449 Chronic obstructive pulmonary disease, unspecified: Secondary | ICD-10-CM | POA: Diagnosis not present

## 2021-09-27 DIAGNOSIS — J9621 Acute and chronic respiratory failure with hypoxia: Secondary | ICD-10-CM | POA: Diagnosis not present

## 2021-09-27 DIAGNOSIS — J9622 Acute and chronic respiratory failure with hypercapnia: Secondary | ICD-10-CM | POA: Diagnosis not present

## 2021-09-27 DIAGNOSIS — B9620 Unspecified Escherichia coli [E. coli] as the cause of diseases classified elsewhere: Secondary | ICD-10-CM | POA: Diagnosis not present

## 2021-09-27 DIAGNOSIS — J449 Chronic obstructive pulmonary disease, unspecified: Secondary | ICD-10-CM | POA: Diagnosis not present

## 2021-09-27 DIAGNOSIS — C3412 Malignant neoplasm of upper lobe, left bronchus or lung: Secondary | ICD-10-CM | POA: Diagnosis not present

## 2021-09-27 DIAGNOSIS — N390 Urinary tract infection, site not specified: Secondary | ICD-10-CM | POA: Diagnosis not present

## 2021-09-29 DIAGNOSIS — Z87891 Personal history of nicotine dependence: Secondary | ICD-10-CM | POA: Diagnosis not present

## 2021-09-29 DIAGNOSIS — Z510 Encounter for antineoplastic radiation therapy: Secondary | ICD-10-CM | POA: Diagnosis not present

## 2021-09-29 DIAGNOSIS — C3412 Malignant neoplasm of upper lobe, left bronchus or lung: Secondary | ICD-10-CM | POA: Diagnosis not present

## 2021-09-29 DIAGNOSIS — C342 Malignant neoplasm of middle lobe, bronchus or lung: Secondary | ICD-10-CM | POA: Diagnosis not present

## 2021-09-30 ENCOUNTER — Other Ambulatory Visit: Payer: Self-pay | Admitting: Student

## 2021-09-30 ENCOUNTER — Ambulatory Visit: Payer: Medicare Other | Admitting: Radiation Oncology

## 2021-09-30 DIAGNOSIS — M25562 Pain in left knee: Secondary | ICD-10-CM

## 2021-10-01 ENCOUNTER — Ambulatory Visit: Payer: Medicare Other | Admitting: Radiation Oncology

## 2021-10-01 DIAGNOSIS — I5032 Chronic diastolic (congestive) heart failure: Secondary | ICD-10-CM | POA: Diagnosis not present

## 2021-10-01 DIAGNOSIS — D509 Iron deficiency anemia, unspecified: Secondary | ICD-10-CM | POA: Diagnosis not present

## 2021-10-01 DIAGNOSIS — I5033 Acute on chronic diastolic (congestive) heart failure: Secondary | ICD-10-CM | POA: Diagnosis not present

## 2021-10-01 DIAGNOSIS — G4700 Insomnia, unspecified: Secondary | ICD-10-CM | POA: Diagnosis not present

## 2021-10-01 DIAGNOSIS — B9620 Unspecified Escherichia coli [E. coli] as the cause of diseases classified elsewhere: Secondary | ICD-10-CM | POA: Diagnosis not present

## 2021-10-01 DIAGNOSIS — J9622 Acute and chronic respiratory failure with hypercapnia: Secondary | ICD-10-CM | POA: Diagnosis not present

## 2021-10-01 DIAGNOSIS — N183 Chronic kidney disease, stage 3 unspecified: Secondary | ICD-10-CM | POA: Diagnosis not present

## 2021-10-01 DIAGNOSIS — C3412 Malignant neoplasm of upper lobe, left bronchus or lung: Secondary | ICD-10-CM | POA: Diagnosis not present

## 2021-10-01 DIAGNOSIS — E890 Postprocedural hypothyroidism: Secondary | ICD-10-CM | POA: Diagnosis not present

## 2021-10-01 DIAGNOSIS — E785 Hyperlipidemia, unspecified: Secondary | ICD-10-CM | POA: Diagnosis not present

## 2021-10-01 DIAGNOSIS — I129 Hypertensive chronic kidney disease with stage 1 through stage 4 chronic kidney disease, or unspecified chronic kidney disease: Secondary | ICD-10-CM | POA: Diagnosis not present

## 2021-10-01 DIAGNOSIS — D638 Anemia in other chronic diseases classified elsewhere: Secondary | ICD-10-CM | POA: Diagnosis not present

## 2021-10-01 DIAGNOSIS — N390 Urinary tract infection, site not specified: Secondary | ICD-10-CM | POA: Diagnosis not present

## 2021-10-01 DIAGNOSIS — J449 Chronic obstructive pulmonary disease, unspecified: Secondary | ICD-10-CM | POA: Diagnosis not present

## 2021-10-01 DIAGNOSIS — F325 Major depressive disorder, single episode, in full remission: Secondary | ICD-10-CM | POA: Diagnosis not present

## 2021-10-01 DIAGNOSIS — C3491 Malignant neoplasm of unspecified part of right bronchus or lung: Secondary | ICD-10-CM | POA: Diagnosis not present

## 2021-10-01 DIAGNOSIS — J9621 Acute and chronic respiratory failure with hypoxia: Secondary | ICD-10-CM | POA: Diagnosis not present

## 2021-10-03 DIAGNOSIS — Z8614 Personal history of Methicillin resistant Staphylococcus aureus infection: Secondary | ICD-10-CM | POA: Diagnosis not present

## 2021-10-03 DIAGNOSIS — K259 Gastric ulcer, unspecified as acute or chronic, without hemorrhage or perforation: Secondary | ICD-10-CM | POA: Diagnosis not present

## 2021-10-03 DIAGNOSIS — N189 Chronic kidney disease, unspecified: Secondary | ICD-10-CM | POA: Diagnosis not present

## 2021-10-03 DIAGNOSIS — K219 Gastro-esophageal reflux disease without esophagitis: Secondary | ICD-10-CM | POA: Diagnosis not present

## 2021-10-03 DIAGNOSIS — J9621 Acute and chronic respiratory failure with hypoxia: Secondary | ICD-10-CM | POA: Diagnosis not present

## 2021-10-03 DIAGNOSIS — Z85820 Personal history of malignant melanoma of skin: Secondary | ICD-10-CM | POA: Diagnosis not present

## 2021-10-03 DIAGNOSIS — F411 Generalized anxiety disorder: Secondary | ICD-10-CM | POA: Diagnosis not present

## 2021-10-03 DIAGNOSIS — G4733 Obstructive sleep apnea (adult) (pediatric): Secondary | ICD-10-CM | POA: Diagnosis not present

## 2021-10-03 DIAGNOSIS — Z923 Personal history of irradiation: Secondary | ICD-10-CM | POA: Diagnosis not present

## 2021-10-03 DIAGNOSIS — Z9981 Dependence on supplemental oxygen: Secondary | ICD-10-CM | POA: Diagnosis not present

## 2021-10-03 DIAGNOSIS — Z7951 Long term (current) use of inhaled steroids: Secondary | ICD-10-CM | POA: Diagnosis not present

## 2021-10-03 DIAGNOSIS — D631 Anemia in chronic kidney disease: Secondary | ICD-10-CM | POA: Diagnosis not present

## 2021-10-03 DIAGNOSIS — E039 Hypothyroidism, unspecified: Secondary | ICD-10-CM | POA: Diagnosis not present

## 2021-10-03 DIAGNOSIS — Z85850 Personal history of malignant neoplasm of thyroid: Secondary | ICD-10-CM | POA: Diagnosis not present

## 2021-10-03 DIAGNOSIS — I081 Rheumatic disorders of both mitral and tricuspid valves: Secondary | ICD-10-CM | POA: Diagnosis not present

## 2021-10-03 DIAGNOSIS — I5033 Acute on chronic diastolic (congestive) heart failure: Secondary | ICD-10-CM | POA: Diagnosis not present

## 2021-10-03 DIAGNOSIS — J449 Chronic obstructive pulmonary disease, unspecified: Secondary | ICD-10-CM | POA: Diagnosis not present

## 2021-10-03 DIAGNOSIS — Z7952 Long term (current) use of systemic steroids: Secondary | ICD-10-CM | POA: Diagnosis not present

## 2021-10-03 DIAGNOSIS — D630 Anemia in neoplastic disease: Secondary | ICD-10-CM | POA: Diagnosis not present

## 2021-10-03 DIAGNOSIS — I739 Peripheral vascular disease, unspecified: Secondary | ICD-10-CM | POA: Diagnosis not present

## 2021-10-03 DIAGNOSIS — C3412 Malignant neoplasm of upper lobe, left bronchus or lung: Secondary | ICD-10-CM | POA: Diagnosis not present

## 2021-10-03 DIAGNOSIS — I130 Hypertensive heart and chronic kidney disease with heart failure and stage 1 through stage 4 chronic kidney disease, or unspecified chronic kidney disease: Secondary | ICD-10-CM | POA: Diagnosis not present

## 2021-10-03 DIAGNOSIS — J9622 Acute and chronic respiratory failure with hypercapnia: Secondary | ICD-10-CM | POA: Diagnosis not present

## 2021-10-03 DIAGNOSIS — E782 Mixed hyperlipidemia: Secondary | ICD-10-CM | POA: Diagnosis not present

## 2021-10-03 DIAGNOSIS — Z85828 Personal history of other malignant neoplasm of skin: Secondary | ICD-10-CM | POA: Diagnosis not present

## 2021-10-04 ENCOUNTER — Ambulatory Visit: Payer: Medicare Other | Admitting: Radiation Oncology

## 2021-10-05 ENCOUNTER — Other Ambulatory Visit: Payer: Self-pay

## 2021-10-05 ENCOUNTER — Ambulatory Visit
Admission: RE | Admit: 2021-10-05 | Discharge: 2021-10-05 | Disposition: A | Discharge status: Home / Self Care | Payer: Medicare Other | Source: Ambulatory Visit | Attending: Radiation Oncology | Admitting: Radiation Oncology

## 2021-10-05 ENCOUNTER — Ambulatory Visit: Payer: Medicare Other | Admitting: Radiation Oncology

## 2021-10-05 DIAGNOSIS — J449 Chronic obstructive pulmonary disease, unspecified: Secondary | ICD-10-CM | POA: Diagnosis not present

## 2021-10-05 DIAGNOSIS — G4733 Obstructive sleep apnea (adult) (pediatric): Secondary | ICD-10-CM | POA: Diagnosis not present

## 2021-10-05 DIAGNOSIS — D638 Anemia in other chronic diseases classified elsewhere: Secondary | ICD-10-CM | POA: Diagnosis not present

## 2021-10-05 DIAGNOSIS — F411 Generalized anxiety disorder: Secondary | ICD-10-CM | POA: Diagnosis not present

## 2021-10-05 DIAGNOSIS — J9611 Chronic respiratory failure with hypoxia: Secondary | ICD-10-CM | POA: Diagnosis not present

## 2021-10-05 DIAGNOSIS — R5383 Other fatigue: Secondary | ICD-10-CM | POA: Diagnosis not present

## 2021-10-05 DIAGNOSIS — C3412 Malignant neoplasm of upper lobe, left bronchus or lung: Secondary | ICD-10-CM | POA: Diagnosis not present

## 2021-10-05 DIAGNOSIS — Z8616 Personal history of COVID-19: Secondary | ICD-10-CM | POA: Diagnosis not present

## 2021-10-05 DIAGNOSIS — Z510 Encounter for antineoplastic radiation therapy: Secondary | ICD-10-CM | POA: Diagnosis not present

## 2021-10-05 DIAGNOSIS — R918 Other nonspecific abnormal finding of lung field: Secondary | ICD-10-CM | POA: Diagnosis not present

## 2021-10-06 DIAGNOSIS — J9622 Acute and chronic respiratory failure with hypercapnia: Secondary | ICD-10-CM | POA: Diagnosis not present

## 2021-10-06 DIAGNOSIS — D630 Anemia in neoplastic disease: Secondary | ICD-10-CM | POA: Diagnosis not present

## 2021-10-06 DIAGNOSIS — J449 Chronic obstructive pulmonary disease, unspecified: Secondary | ICD-10-CM | POA: Diagnosis not present

## 2021-10-06 DIAGNOSIS — J9621 Acute and chronic respiratory failure with hypoxia: Secondary | ICD-10-CM | POA: Diagnosis not present

## 2021-10-06 DIAGNOSIS — I130 Hypertensive heart and chronic kidney disease with heart failure and stage 1 through stage 4 chronic kidney disease, or unspecified chronic kidney disease: Secondary | ICD-10-CM | POA: Diagnosis not present

## 2021-10-06 DIAGNOSIS — C3412 Malignant neoplasm of upper lobe, left bronchus or lung: Secondary | ICD-10-CM | POA: Diagnosis not present

## 2021-10-07 ENCOUNTER — Ambulatory Visit
Admission: RE | Admit: 2021-10-07 | Discharge: 2021-10-07 | Disposition: A | Discharge status: Home / Self Care | Payer: Medicare Other | Source: Ambulatory Visit | Attending: Radiation Oncology | Admitting: Radiation Oncology

## 2021-10-07 ENCOUNTER — Other Ambulatory Visit: Payer: Self-pay

## 2021-10-07 DIAGNOSIS — Z510 Encounter for antineoplastic radiation therapy: Secondary | ICD-10-CM | POA: Diagnosis not present

## 2021-10-07 DIAGNOSIS — C3412 Malignant neoplasm of upper lobe, left bronchus or lung: Secondary | ICD-10-CM | POA: Diagnosis not present

## 2021-10-12 ENCOUNTER — Other Ambulatory Visit: Payer: Self-pay

## 2021-10-12 ENCOUNTER — Encounter: Payer: Self-pay | Admitting: Radiation Oncology

## 2021-10-12 ENCOUNTER — Ambulatory Visit
Admission: RE | Admit: 2021-10-12 | Discharge: 2021-10-12 | Disposition: A | Discharge status: Home / Self Care | Payer: Medicare Other | Source: Ambulatory Visit | Attending: Radiation Oncology | Admitting: Radiation Oncology

## 2021-10-12 DIAGNOSIS — Z87891 Personal history of nicotine dependence: Secondary | ICD-10-CM | POA: Diagnosis not present

## 2021-10-12 DIAGNOSIS — Z510 Encounter for antineoplastic radiation therapy: Secondary | ICD-10-CM | POA: Diagnosis not present

## 2021-10-12 DIAGNOSIS — C3412 Malignant neoplasm of upper lobe, left bronchus or lung: Secondary | ICD-10-CM | POA: Diagnosis not present

## 2021-10-12 DIAGNOSIS — C342 Malignant neoplasm of middle lobe, bronchus or lung: Secondary | ICD-10-CM | POA: Diagnosis not present

## 2021-10-14 ENCOUNTER — Ambulatory Visit: Payer: Medicare Other | Admitting: Radiation Oncology

## 2021-10-14 DIAGNOSIS — J9622 Acute and chronic respiratory failure with hypercapnia: Secondary | ICD-10-CM | POA: Diagnosis not present

## 2021-10-14 DIAGNOSIS — D630 Anemia in neoplastic disease: Secondary | ICD-10-CM | POA: Diagnosis not present

## 2021-10-14 DIAGNOSIS — C3412 Malignant neoplasm of upper lobe, left bronchus or lung: Secondary | ICD-10-CM | POA: Diagnosis not present

## 2021-10-14 DIAGNOSIS — J9621 Acute and chronic respiratory failure with hypoxia: Secondary | ICD-10-CM | POA: Diagnosis not present

## 2021-10-14 DIAGNOSIS — J449 Chronic obstructive pulmonary disease, unspecified: Secondary | ICD-10-CM | POA: Diagnosis not present

## 2021-10-14 DIAGNOSIS — I130 Hypertensive heart and chronic kidney disease with heart failure and stage 1 through stage 4 chronic kidney disease, or unspecified chronic kidney disease: Secondary | ICD-10-CM | POA: Diagnosis not present

## 2021-10-20 DIAGNOSIS — J449 Chronic obstructive pulmonary disease, unspecified: Secondary | ICD-10-CM | POA: Diagnosis not present

## 2021-10-20 DIAGNOSIS — J9621 Acute and chronic respiratory failure with hypoxia: Secondary | ICD-10-CM | POA: Diagnosis not present

## 2021-10-20 DIAGNOSIS — J9622 Acute and chronic respiratory failure with hypercapnia: Secondary | ICD-10-CM | POA: Diagnosis not present

## 2021-10-20 DIAGNOSIS — C3412 Malignant neoplasm of upper lobe, left bronchus or lung: Secondary | ICD-10-CM | POA: Diagnosis not present

## 2021-10-20 DIAGNOSIS — D630 Anemia in neoplastic disease: Secondary | ICD-10-CM | POA: Diagnosis not present

## 2021-10-20 DIAGNOSIS — I130 Hypertensive heart and chronic kidney disease with heart failure and stage 1 through stage 4 chronic kidney disease, or unspecified chronic kidney disease: Secondary | ICD-10-CM | POA: Diagnosis not present

## 2021-10-26 ENCOUNTER — Other Ambulatory Visit: Payer: Medicare Other

## 2021-10-27 ENCOUNTER — Other Ambulatory Visit: Payer: Self-pay | Admitting: Radiation Oncology

## 2021-10-27 ENCOUNTER — Telehealth: Payer: Self-pay | Admitting: *Deleted

## 2021-10-27 DIAGNOSIS — C342 Malignant neoplasm of middle lobe, bronchus or lung: Secondary | ICD-10-CM

## 2021-10-27 NOTE — Progress Notes (Signed)
° °                                                                                                                                                          °  Patient Name: Holly [name] MRN: [id number] DOB: 22-Apr-1946 Referring Physician: WHITE CYNTHIA (Profile Not Attached) Date of Service: 10/12/2021  [facility] [place]                                                        End Of Treatment Note  Diagnoses: C34.2-Malignant neoplasm of middle lobe, bronchus or lung  Cancer Staging: Putative Stage IA3, cT1cN0M0, NSCLC of the RML with Atypical Cells on biopsy with a history of Stage IA2, cT1bN0M0, NSCLC, 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    Plan: The patient will receive a call in about one month from the radiation oncology department. She will continue to follow in our clinic and orders for new CT scans 6-8 weeks from treatment will be ordered. ________________________________________________    Carola Rhine, PAC

## 2021-10-27 NOTE — Telephone Encounter (Signed)
CALLED PATIENT TO INFORM OF CT FOR 11-24-21- ARRIVAL TIME- 3 PM @ [facility], PT. TO HAVE WATER ONLY- 4 HRS. PRIOR TO TEST, PATIENT TO HAVE I-STAT LABS ON 11-24-21 IN RADIOLOGY, (OK'D BY VANDA) PATIENT WILL BE CALLED BY ALISON PERKINS WITH RESULTS, SPOKE WITH PATIENT AND SHE VERIFIED UNDERSTANDING THESE APPTS.

## 2021-10-29 DIAGNOSIS — D630 Anemia in neoplastic disease: Secondary | ICD-10-CM | POA: Diagnosis not present

## 2021-10-29 DIAGNOSIS — J9622 Acute and chronic respiratory failure with hypercapnia: Secondary | ICD-10-CM | POA: Diagnosis not present

## 2021-10-29 DIAGNOSIS — J449 Chronic obstructive pulmonary disease, unspecified: Secondary | ICD-10-CM | POA: Diagnosis not present

## 2021-10-29 DIAGNOSIS — C3412 Malignant neoplasm of upper lobe, left bronchus or lung: Secondary | ICD-10-CM | POA: Diagnosis not present

## 2021-10-29 DIAGNOSIS — J9621 Acute and chronic respiratory failure with hypoxia: Secondary | ICD-10-CM | POA: Diagnosis not present

## 2021-10-29 DIAGNOSIS — I130 Hypertensive heart and chronic kidney disease with heart failure and stage 1 through stage 4 chronic kidney disease, or unspecified chronic kidney disease: Secondary | ICD-10-CM | POA: Diagnosis not present

## 2021-11-02 DIAGNOSIS — D638 Anemia in other chronic diseases classified elsewhere: Secondary | ICD-10-CM | POA: Diagnosis not present

## 2021-11-02 DIAGNOSIS — I5033 Acute on chronic diastolic (congestive) heart failure: Secondary | ICD-10-CM | POA: Diagnosis not present

## 2021-11-02 DIAGNOSIS — G4700 Insomnia, unspecified: Secondary | ICD-10-CM | POA: Diagnosis not present

## 2021-11-02 DIAGNOSIS — I129 Hypertensive chronic kidney disease with stage 1 through stage 4 chronic kidney disease, or unspecified chronic kidney disease: Secondary | ICD-10-CM | POA: Diagnosis not present

## 2021-11-02 DIAGNOSIS — J449 Chronic obstructive pulmonary disease, unspecified: Secondary | ICD-10-CM | POA: Diagnosis not present

## 2021-11-02 DIAGNOSIS — C3491 Malignant neoplasm of unspecified part of right bronchus or lung: Secondary | ICD-10-CM | POA: Diagnosis not present

## 2021-11-02 DIAGNOSIS — F339 Major depressive disorder, recurrent, unspecified: Secondary | ICD-10-CM | POA: Diagnosis not present

## 2021-11-02 DIAGNOSIS — N183 Chronic kidney disease, stage 3 unspecified: Secondary | ICD-10-CM | POA: Diagnosis not present

## 2021-11-02 DIAGNOSIS — E890 Postprocedural hypothyroidism: Secondary | ICD-10-CM | POA: Diagnosis not present

## 2021-11-02 DIAGNOSIS — E785 Hyperlipidemia, unspecified: Secondary | ICD-10-CM | POA: Diagnosis not present

## 2021-11-08 ENCOUNTER — Ambulatory Visit
Admission: RE | Admit: 2021-11-08 | Discharge: 2021-11-08 | Disposition: A | Discharge status: Home / Self Care | Payer: Medicare Other | Source: Ambulatory Visit | Attending: Radiation Oncology | Admitting: Radiation Oncology

## 2021-11-08 DIAGNOSIS — E785 Hyperlipidemia, unspecified: Secondary | ICD-10-CM | POA: Diagnosis not present

## 2021-11-08 DIAGNOSIS — D509 Iron deficiency anemia, unspecified: Secondary | ICD-10-CM | POA: Diagnosis not present

## 2021-11-08 DIAGNOSIS — G3184 Mild cognitive impairment, so stated: Secondary | ICD-10-CM | POA: Diagnosis not present

## 2021-11-08 DIAGNOSIS — I129 Hypertensive chronic kidney disease with stage 1 through stage 4 chronic kidney disease, or unspecified chronic kidney disease: Secondary | ICD-10-CM | POA: Diagnosis not present

## 2021-11-08 DIAGNOSIS — M25562 Pain in left knee: Secondary | ICD-10-CM | POA: Diagnosis not present

## 2021-11-08 DIAGNOSIS — E440 Moderate protein-calorie malnutrition: Secondary | ICD-10-CM | POA: Diagnosis not present

## 2021-11-08 DIAGNOSIS — F419 Anxiety disorder, unspecified: Secondary | ICD-10-CM | POA: Diagnosis not present

## 2021-11-08 DIAGNOSIS — C342 Malignant neoplasm of middle lobe, bronchus or lung: Secondary | ICD-10-CM | POA: Insufficient documentation

## 2021-11-08 DIAGNOSIS — E890 Postprocedural hypothyroidism: Secondary | ICD-10-CM | POA: Diagnosis not present

## 2021-11-08 DIAGNOSIS — C3491 Malignant neoplasm of unspecified part of right bronchus or lung: Secondary | ICD-10-CM | POA: Diagnosis not present

## 2021-11-08 DIAGNOSIS — F325 Major depressive disorder, single episode, in full remission: Secondary | ICD-10-CM | POA: Diagnosis not present

## 2021-11-08 DIAGNOSIS — N183 Chronic kidney disease, stage 3 unspecified: Secondary | ICD-10-CM | POA: Diagnosis not present

## 2021-11-08 NOTE — Progress Notes (Addendum)
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  She continues with O2 at 3L  but her breathing is more labored per report. She denies any new fevers or chills, and denies progressively productive cough or hemoptysis.  Impression/Plan: 1. 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 The patient has been doing well since completion of radiotherapy but in addition to wishing her a Happy Birthday, I recommended she also follow up with Dr. Dema Severin. We will follow up with her by phone with her upcoming CT scan on 11/24/21.     Carola Rhine, PAC

## 2021-11-09 DIAGNOSIS — J069 Acute upper respiratory infection, unspecified: Secondary | ICD-10-CM | POA: Diagnosis not present

## 2021-11-09 DIAGNOSIS — J9611 Chronic respiratory failure with hypoxia: Secondary | ICD-10-CM | POA: Diagnosis not present

## 2021-11-09 DIAGNOSIS — F411 Generalized anxiety disorder: Secondary | ICD-10-CM | POA: Diagnosis not present

## 2021-11-09 DIAGNOSIS — R5383 Other fatigue: Secondary | ICD-10-CM | POA: Diagnosis not present

## 2021-11-09 DIAGNOSIS — Z79899 Other long term (current) drug therapy: Secondary | ICD-10-CM | POA: Diagnosis not present

## 2021-11-09 DIAGNOSIS — R918 Other nonspecific abnormal finding of lung field: Secondary | ICD-10-CM | POA: Diagnosis not present

## 2021-11-09 DIAGNOSIS — J449 Chronic obstructive pulmonary disease, unspecified: Secondary | ICD-10-CM | POA: Diagnosis not present

## 2021-11-09 DIAGNOSIS — G4733 Obstructive sleep apnea (adult) (pediatric): Secondary | ICD-10-CM | POA: Diagnosis not present

## 2021-11-09 DIAGNOSIS — D638 Anemia in other chronic diseases classified elsewhere: Secondary | ICD-10-CM | POA: Diagnosis not present

## 2021-11-15 ENCOUNTER — Telehealth: Payer: Self-pay

## 2021-11-15 NOTE — Telephone Encounter (Signed)
Spoke with patient's daughter [name] and scheduled a Televisit Palliative Consult for 11/18/21 @ 12:30 PM.  Consent obtained; updated Outlook/Netsmart/Team List and Epic.

## 2021-11-18 ENCOUNTER — Other Ambulatory Visit: Payer: Self-pay

## 2021-11-18 ENCOUNTER — Other Ambulatory Visit: Payer: Medicare Other | Admitting: Hospice

## 2021-11-23 ENCOUNTER — Other Ambulatory Visit: Payer: Self-pay

## 2021-11-23 ENCOUNTER — Other Ambulatory Visit: Payer: Medicare Other | Admitting: Hospice

## 2021-11-23 ENCOUNTER — Other Ambulatory Visit: Payer: Medicare Other

## 2021-11-23 DIAGNOSIS — C342 Malignant neoplasm of middle lobe, bronchus or lung: Secondary | ICD-10-CM

## 2021-11-23 DIAGNOSIS — G8929 Other chronic pain: Secondary | ICD-10-CM

## 2021-11-23 DIAGNOSIS — M25562 Pain in left knee: Secondary | ICD-10-CM

## 2021-11-23 DIAGNOSIS — Z515 Encounter for palliative care: Secondary | ICD-10-CM

## 2021-11-23 DIAGNOSIS — J449 Chronic obstructive pulmonary disease, unspecified: Secondary | ICD-10-CM

## 2021-11-23 NOTE — Progress Notes (Signed)
[facility] Palliative Care Consult Note Telephone: [phone]  Fax: [phone]  PATIENT NAME: Holly [name] [id number] [phone] (home) [phone] (work) DOB: 1945/11/11 MRN: [id number]  PRIMARY CARE PROVIDER:    Harlan Stains, MD,  [address] [place] [address] [phone]  REFERRING PROVIDER:   Harlan Stains, MD [place],  [place] [address] [phone]  RESPONSIBLE PARTY:   Self/Holly [name] Contact Information     Name Relation Home Work Mobile   [name] Daughter [phone]  [phone]   Holly [name] [phone]  [phone]       TELEHEALTH VISIT STATEMENT Due to the COVID-19 crisis, this visit was done via telemedicine from my office and it was initiated and consent by this patient and or family. Video-audio (telehealth) contact was unable to be done due to technical barriers from the patients side. I connected with patient OR PROXY by a telephone  and verified that I am speaking with the correct person. I discussed the limitations of evaluation and management by telemedicine. The patient expressed understanding and agreed to proceed. Palliative Care was asked to follow this patient to address advance care planning, complex medical decision making and goals of care clarification.  Holly [name] is with patient during visit This is the initial visit.    ASSESSMENT AND / RECOMMENDATIONS:   Advance Care Planning: Our advance care planning conversation included a discussion about:    The value and importance of advance care planning  Difference between Hospice and Palliative care Exploration of goals of care in the event of a sudden injury or illness  Identification and preparation of a healthcare agent  Review and updating or creation of an  advance directive document . Decision not to resuscitate or to de-escalate disease focused  treatments due to poor prognosis.  CODE STATUS: Patient is a Do Not Resuscitate  Goals of Care: Goals include to maximize quality of life and symptom management.   I spent  16 minutes providing this initial consultation. More than 50% of the time in this consultation was spent on counseling patient and coordinating communication. --------------------------------------------------------------------------------------------------------------------------------------  Symptom Management/Plan: Lung CA: Radiation was completed last month, plan for CT scan next week. Followed by Oncologist. COPD: Continue 3 L/Min continuous; Albuterol, Trilegy CPAP. CHF: Managed with Lasix. Continue Torsemide as ordered. Elevate BLE during the day as much as possible to promote circulation. Adhere to Fluid and salt limits. Monitor weight closely and report weight gain of 3 Ibs in a day or 5 Ibs in a week  to eval for additional diuresis. Weight loss: Current weight is 135 Ibs down from 156 Ibs 6 months ago. Continue Mirtazapine as ordered.  Left knee pain: Managed with Prednisone, Tramadol, Tylenol. Followed by Ortho, MRI for the knee today.   Follow up: Palliative care will continue to follow for complex medical decision making, advance care planning, and clarification of goals. Return 6 weeks or prn. Encouraged to call provider sooner with any concerns.   Family /Caregiver/Community Supports: Patient lives at home with her daughter Holly [name] ELIGIBILITY/DIAGNOSIS: TBD  Chief Complaint: Initial Palliative care visit  HISTORY OF PRESENT ILLNESS:  Holly [name] is a 76 y.o. year old female  with multiple medical conditions including right lung cancer, COPD, CHF, right knee pain.  Patient is not on chemotherapy at this time, completed radiation last month and has CT scan scheduled for next week.  She endorsed occasional  pain in her left knee, shortness of breath on exertion, in no respiratory distress currently  and no recent COPD exacerbation.  Continuous oxygen during supplementation at 3 L/Min is helpful. History obtained from review of EMR, discussion with primary team, caregiver, family and/or Holly [name].  Review and summarization of Epic records shows history from other than patient. Rest of 10 point ROS asked and negative.  I reviewed as needed, available labs, patient records, imaging, studies and related documents from the EMR.  ROS General: NAD EYES: denies vision changes ENMT: denies dysphagia Cardiovascular: denies chest pain/discomfort Pulmonary: denies cough, endorses SOB on exertion Abdomen: endorses good appetite, denies constipation/diarrhea GU: denies dysuria, urinary frequency MSK:  endorses weakness,  no falls reported Skin: denies rashes or wounds Neurological: Endorses occasional left knee pain, denies insomnia Psych: Endorses positive mood Heme/lymph/immuno: denies bruises, abnormal bleeding   PAST MEDICAL HISTORY:  Active Ambulatory Problems    Diagnosis Date Noted   CARCINOMA, BASAL CELL, 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   HYPERLIPIDEMIA, MIXED 05/13/2010   HYPERCALCEMIA 05/13/2010   Anxiety state 05/13/2010   Essential hypertension 05/13/2010   COPD GOLD III  06/11/2010   CERVICALGIA 05/13/2010   OSTEOPENIA 05/13/2010   URINARY INCONTINENCE 05/13/2010   CARCINOMA, BASAL CELL, 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   COPD exacerbation ([facility]) 02/11/2013   Pedal edema 02/11/2013   Multinodular thyroid 07/30/2014   Neoplasm of uncertain behavior of thyroid gland, 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   Cigarette smoker 10/16/2014   Chronic respiratory failure with hypoxia ([place]) 11/11/2014   Cough 11/11/2015   Closed fracture of right olecranon process 12/04/2017   Primary malignant neoplasm of left upper lobe of lung ([place]) 07/09/2019   Ulna, olecranon process fracture, right, closed, with routine healing, subsequent encounter 10/01/2020   Olecranon bursitis of right elbow 10/02/2020   GI bleed 02/16/2021   ABLA  (acute blood loss anemia) 02/16/2021   Diverticulitis 02/16/2021   Current chronic use of systemic steroids 02/16/2021   Pressure injury of skin 02/16/2021   Acute renal failure superimposed on stage 3a chronic kidney disease ([facility]) 02/22/2021   Hypotension 02/23/2021   Hypokalemia 02/23/2021   Acute on chronic respiratory failure ([facility]) 07/23/2021   Goals of care, counseling/discussion    Acute respiratory failure with hypoxia and hypercapnia (HCC)    Septic shock (HCC)    Acute on chronic respiratory failure with hypoxia and hypercapnia (HCC) 08/28/2021   Anemia due to chronic kidney disease 09/32/6712   Acute metabolic encephalopathy 45/80/9983   GERD (gastroesophageal reflux disease) 08/28/2021   DNR (do not resuscitate) 08/28/2021   SIRS (systemic inflammatory response syndrome) ([facility]) 08/28/2021   E coli bacteremia 08/29/2021   Sepsis secondary to UTI ([facility]) 08/29/2021   Malignant neoplasm of middle lobe of right lung ([facility]) 09/06/2021   Resolved Ambulatory Problems    Diagnosis Date Noted   DYSPNEA ON EXERTION 05/14/2010   Past Medical History:  Diagnosis Date   Anxiety    Asthma    Basal cell carcinoma    Bruises easily    Cataract    COPD (chronic obstructive pulmonary disease) (HCC)    Dyspnea    Foot swelling    Frequency of urination    Headache    History of skin cancer    Hypertension    Hypothyroidism    Pneumonia    Pulmonary nodule    Sleep apnea    Thyroid neoplasm    Urinary incontinence    Wears dentures    Wears glasses     SOCIAL  HX:  Social History   Tobacco Use   Smoking status: Former    Packs/day: 1.00    Years: 30.00    Pack years: 30.00    Types: Cigarettes    Quit date: 02/24/2015    Years since quitting: 6.7   Smokeless tobacco: Never  Substance Use Topics   Alcohol use: Yes    Alcohol/week: 0.0 standard drinks    Comment: very seldom     FAMILY HX:  Family History  Problem Relation Age of Onset   Coronary artery disease  Father    Hypertension Father    Asthma Father    Allergies Father    Hypertension Mother    Lung cancer Mother       ALLERGIES:  Allergies  Allergen Reactions   Amlodipine Besylate Swelling    Edema   Codeine Nausea And Vomiting   Oxycodone-Acetaminophen Nausea And Vomiting      PERTINENT MEDICATIONS:  Outpatient Encounter Medications as of 11/23/2021  Medication Sig   acetaminophen (TYLENOL) 325 MG tablet Take 650 mg by mouth every 6 (six) hours as needed for mild pain.   ALPRAZolam (XANAX) 0.5 MG tablet Take 0.5 mg by mouth 2 (two) times daily as needed for anxiety.   atorvastatin (LIPITOR) 20 MG tablet Take 20 mg by mouth at bedtime.    buPROPion (WELLBUTRIN XL) 300 MG 24 hr tablet Take 300 mg by mouth every morning.   busPIRone (BUSPAR) 5 MG tablet Take 1 tablet (5 mg total) by mouth 3 (three) times daily.   Docusate Sodium (COLACE PO) Take 1-2 tablets by mouth daily.   donepezil (ARICEPT) 10 MG tablet Take 10 mg by mouth at bedtime.   Fluticasone-Umeclidin-Vilant 100-62.5-25 MCG/INH AEPB Inhale 1 puff into the lungs daily. Trelegy   furosemide (LASIX) 20 MG tablet Take 1 tablet (20 mg total) by mouth every other day.   levothyroxine (SYNTHROID) 137 MCG tablet Take 137 mcg by mouth daily before breakfast.   losartan (COZAAR) 50 MG tablet Take 1 tablet (50 mg total) by mouth daily.   metoprolol succinate (TOPROL-XL) 50 MG 24 hr tablet Take 50 mg by mouth daily. Take with or immediately following a meal.   ondansetron (ZOFRAN) 4 MG tablet Take 1 tablet (4 mg total) by mouth every 8 (eight) hours as needed for nausea or vomiting.   pantoprazole (PROTONIX) 40 MG tablet Take 1 tablet (40 mg total) by mouth daily.   polyethylene glycol (MIRALAX / GLYCOLAX) 17 g packet Take 17 g by mouth daily as needed for moderate constipation. (Patient not taking: Reported on 08/28/2021)   predniSONE (DELTASONE) 20 MG tablet Take 40 mg by mouth daily. Take 40mg  daily for five days  (08/25/21-08/29/21) then go back to 20mg  daily continuous.   traMADol (ULTRAM) 50 MG tablet Take 50 mg by mouth every 6 (six) hours as needed for moderate pain.   VENTOLIN HFA 108 (90 Base) MCG/ACT inhaler INHALE 2 PUFFS INTO THE LUNGS EVERY 4 HOURS AS NEEDED FOR WHEEZING ORSHORTNESS OF BREATH (Patient taking differently: Inhale 2 puffs into the lungs every 4 (four) hours as needed for wheezing or shortness of breath.)   No facility-administered encounter medications on file as of 11/23/2021.     Thank you for the opportunity to participate in the care of Holly [name].  The palliative care team will continue to follow. Please call our office at [phone] if we can be of additional assistance.   Note: Portions of this note were generated with [employer]  dictation software. Dictation errors may occur despite best attempts at proofreading.  Teodoro Spray, NP

## 2021-11-24 ENCOUNTER — Ambulatory Visit (HOSPITAL_COMMUNITY): Payer: Medicare Other

## 2021-11-25 ENCOUNTER — Other Ambulatory Visit: Payer: Self-pay

## 2021-11-25 ENCOUNTER — Ambulatory Visit
Admission: RE | Admit: 2021-11-25 | Discharge: 2021-11-25 | Disposition: A | Discharge status: Home / Self Care | Payer: Medicare Other | Source: Ambulatory Visit | Attending: Student | Admitting: Student

## 2021-11-25 DIAGNOSIS — Z85118 Personal history of other malignant neoplasm of bronchus and lung: Secondary | ICD-10-CM | POA: Diagnosis not present

## 2021-11-25 DIAGNOSIS — S82122A Displaced fracture of lateral condyle of left tibia, initial encounter for closed fracture: Secondary | ICD-10-CM | POA: Diagnosis not present

## 2021-11-25 DIAGNOSIS — M7989 Other specified soft tissue disorders: Secondary | ICD-10-CM | POA: Diagnosis not present

## 2021-11-25 DIAGNOSIS — M25562 Pain in left knee: Secondary | ICD-10-CM

## 2021-11-25 IMAGING — CT CT KNEE*L* W/O CM
1 series · 12 of 14 positions shown, 15 images · non-contrast
Comparison: None.

CLINICAL DATA: History of history lung cancer.  Pain and swelling

EXAM:
CT OF THE LEFT KNEE WITHOUT CONTRAST
TECHNIQUE: Multidetector CT imaging of the left knee was performed according to
the standard protocol. Multiplanar CT image reconstructions were
also generated.
RADIATION DOSE REDUCTION: This exam was performed according to the
departmental dose-optimization program which includes automated
exposure control, adjustment of the mA and/or kV according to
patient size and/or use of iterative reconstruction technique.

[Series 4: knee soft tissue · axial · 0.39mm/px · z∈[-220,-91]mm · 12 of 51 slices shown, 15 images]
[im 4/51  soft-tissue]
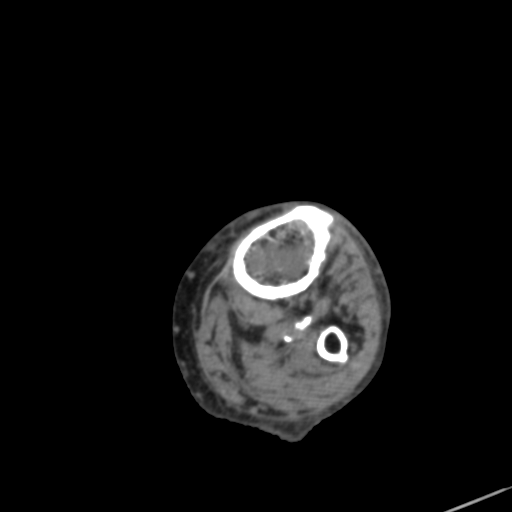
[im 4/51  bone]
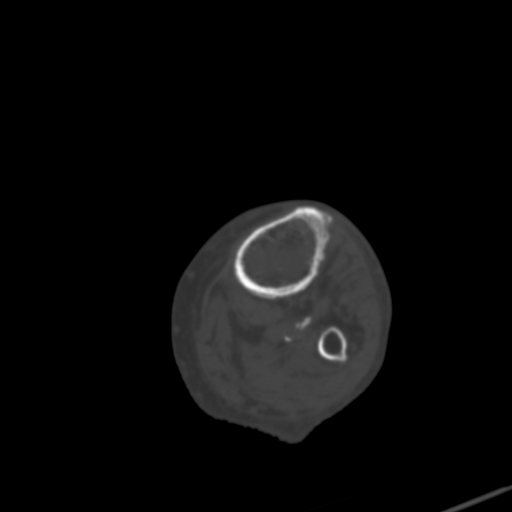
[im 8/51  bone]
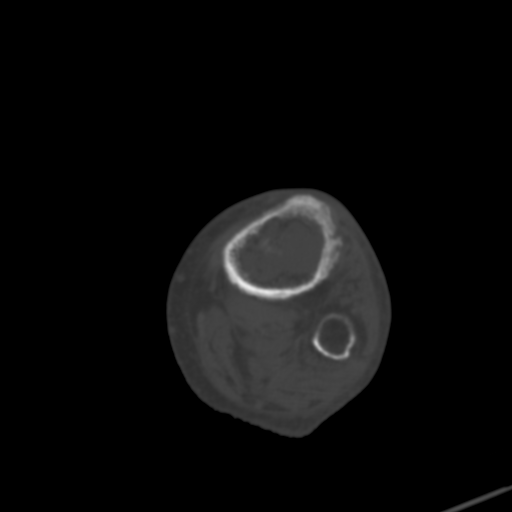
[im 12/51  bone]
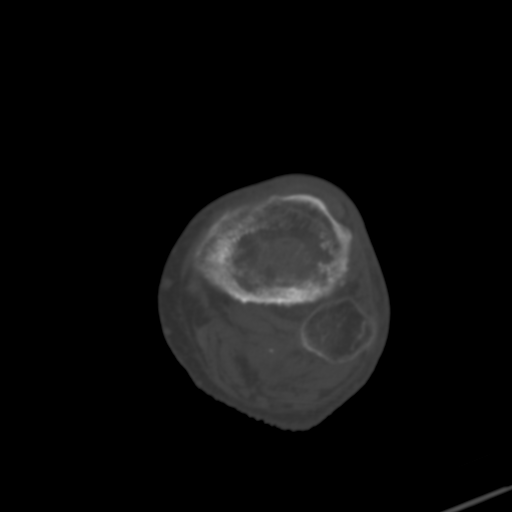
[im 16/51  bone]
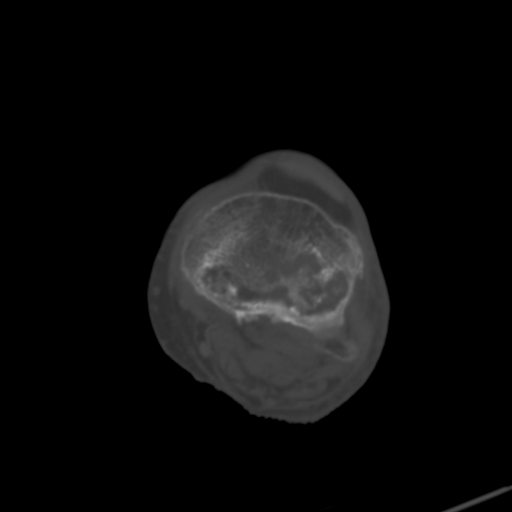
[im 20/51  soft-tissue]
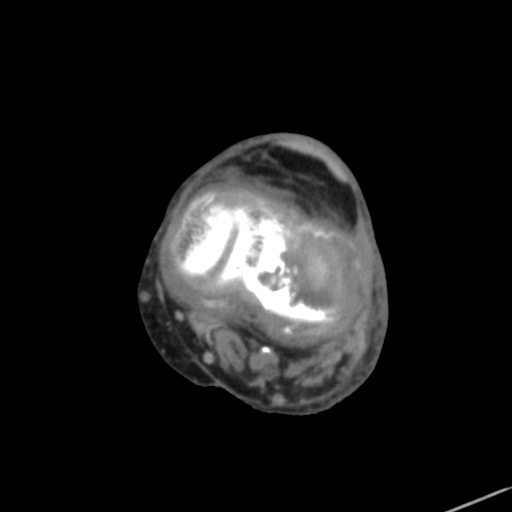
[im 20/51  bone]
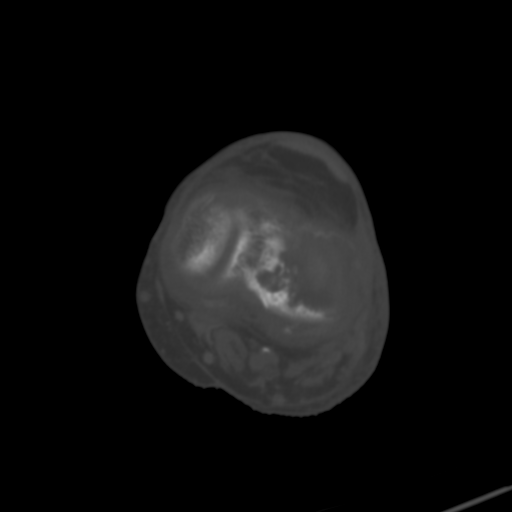
[im 24/51  bone]
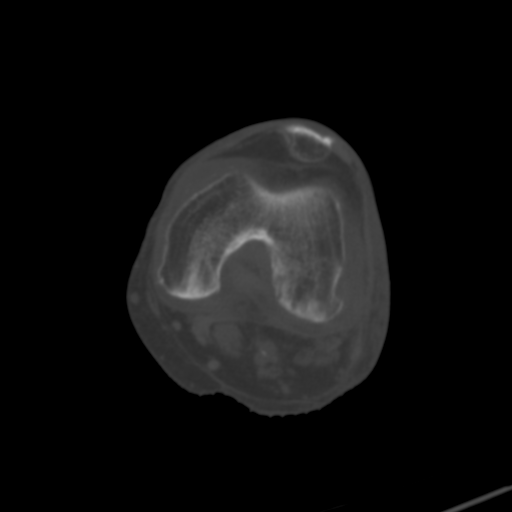
[im 27/51  bone]
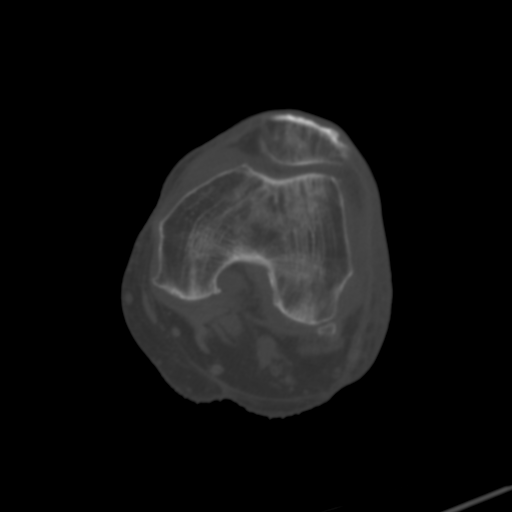
[im 31/51  bone]
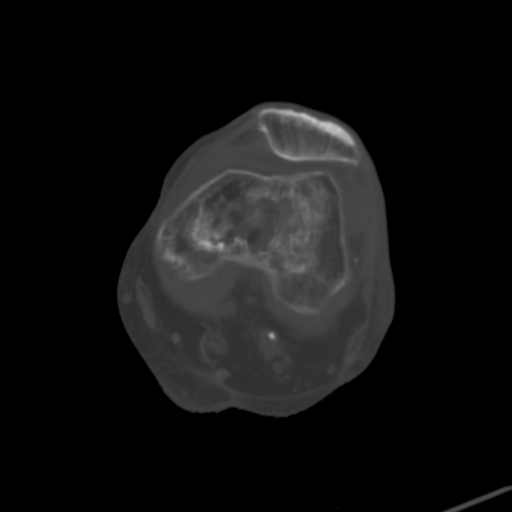
[im 35/51  soft-tissue]
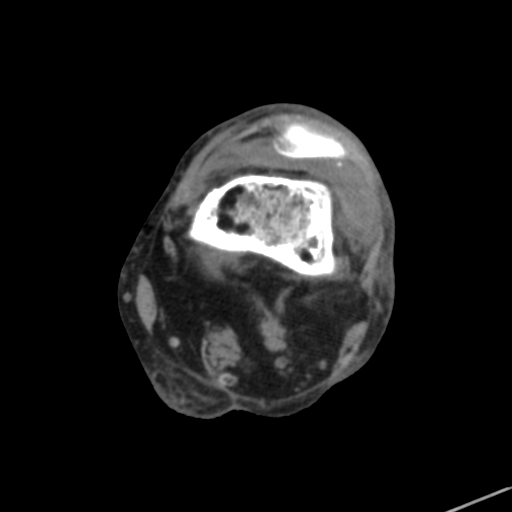
[im 35/51  bone]
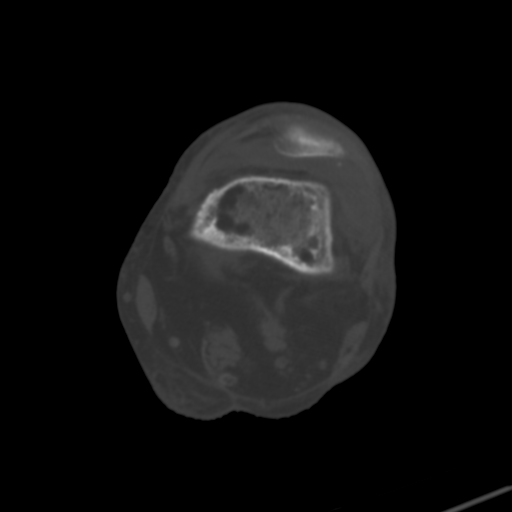
[im 39/51  bone]
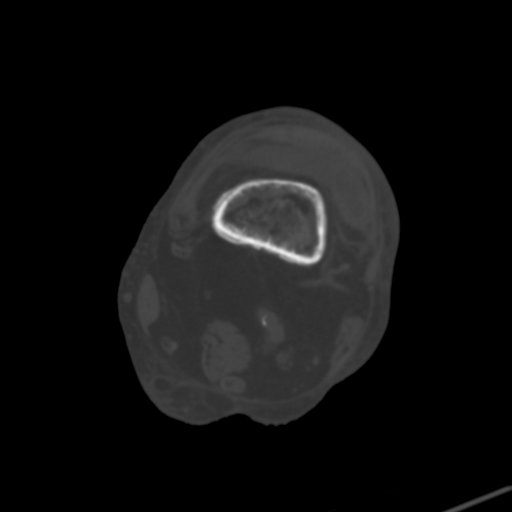
[im 43/51  bone]
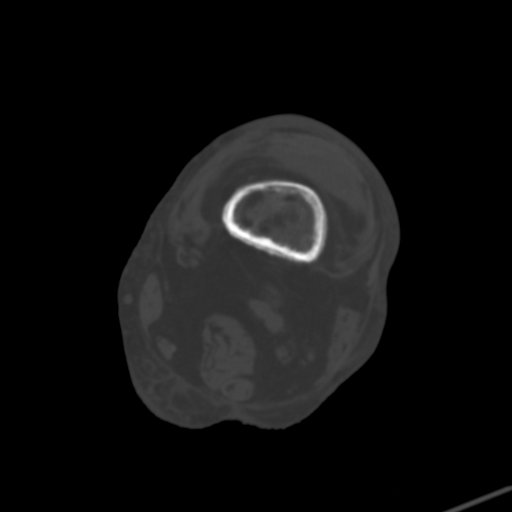
[im 47/51  bone]
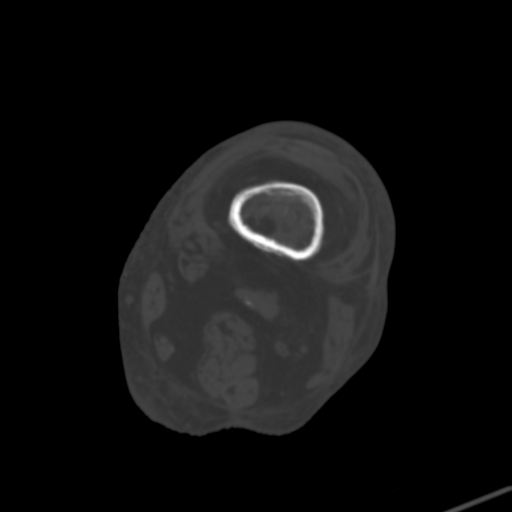

[12 of 14 positions shown; findings below may reference images not displayed]

FINDINGS: Bones/Joint/Cartilage

Generalized osteopenia acute comminuted lateral tibial plateau
fracture with 6.5 mm of depression of the articular surface.

No other acute fracture or dislocation.  Large hemarthrosis.

Numerous abnormal lytic bone lesions involving the distal femoral
diaphysis and metaphysis as well as the proximal tibial epiphysis
and metaphysis consistent with metastatic disease.

Ligaments

Ligaments are suboptimally evaluated by CT.

Muscles and Tendons
Muscles are normal. No muscle atrophy. No intramuscular fluid
collection or hematoma.

Soft tissue
No fluid collection or hematoma. No soft tissue mass. Quadriceps
tendon and patellar tendon are intact.
IMPRESSION: 1. Acute comminuted lateral tibial plateau fracture with 6.5 mm of
depression of the articular surface (Schatzker III). This likely
reflects a pathologic fracture.
2. Numerous abnormal bone lesions involving the distal femur and
proximal tibia consistent with metastatic disease.

These results will be called to the ordering clinician or
representative by the Radiologist Assistant, and communication
documented in the PACS or [REDACTED].

## 2021-11-30 ENCOUNTER — Ambulatory Visit (HOSPITAL_COMMUNITY)
Admission: RE | Admit: 2021-11-30 | Discharge: 2021-11-30 | Disposition: A | Discharge status: Home / Self Care | Payer: Medicare Other | Source: Ambulatory Visit | Attending: Radiation Oncology | Admitting: Radiation Oncology

## 2021-11-30 ENCOUNTER — Encounter (HOSPITAL_COMMUNITY): Payer: Self-pay

## 2021-11-30 ENCOUNTER — Other Ambulatory Visit: Payer: Self-pay

## 2021-11-30 DIAGNOSIS — C342 Malignant neoplasm of middle lobe, bronchus or lung: Secondary | ICD-10-CM | POA: Diagnosis not present

## 2021-11-30 DIAGNOSIS — C3490 Malignant neoplasm of unspecified part of unspecified bronchus or lung: Secondary | ICD-10-CM | POA: Diagnosis not present

## 2021-11-30 DIAGNOSIS — J439 Emphysema, unspecified: Secondary | ICD-10-CM | POA: Diagnosis not present

## 2021-11-30 DIAGNOSIS — R911 Solitary pulmonary nodule: Secondary | ICD-10-CM | POA: Diagnosis not present

## 2021-11-30 DIAGNOSIS — I700 Atherosclerosis of aorta: Secondary | ICD-10-CM | POA: Diagnosis not present

## 2021-11-30 LAB — POCT I-STAT CREATININE: Creatinine, Ser: 1 mg/dL (ref 0.44–1.00)

## 2021-11-30 IMAGING — CT CT CHEST W/ CM
2 of 4 series · 14 of 36 positions shown, 17 images · IV contrast (OMNIPAQUE)
Comparison: PET-CT [DATE] and CT [DATE].

CLINICAL DATA: Non-small cell lung cancer post SB RT.

EXAM:
CT CHEST WITH CONTRAST
TECHNIQUE: Multidetector CT imaging of the chest was performed during
intravenous contrast administration.

[Series 2: axial st · axial · 0.76mm/px · z∈[-320,-72]mm · 11 of 146 slices shown, 14 images]
[im 11/146  mediastinal]
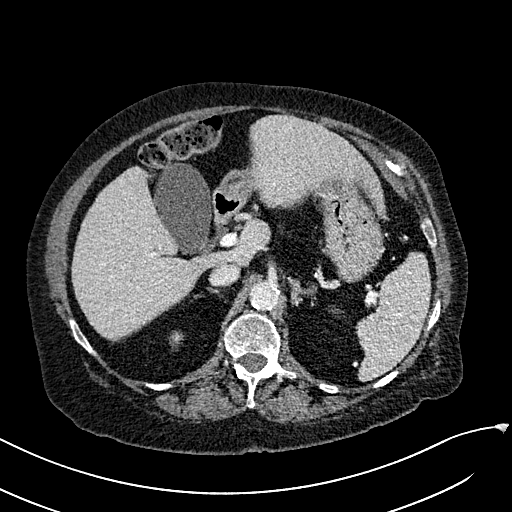
[im 11/146  lung]
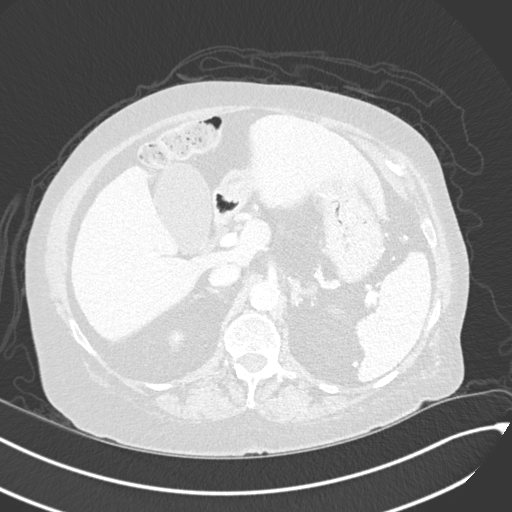
[im 21/146  lung]
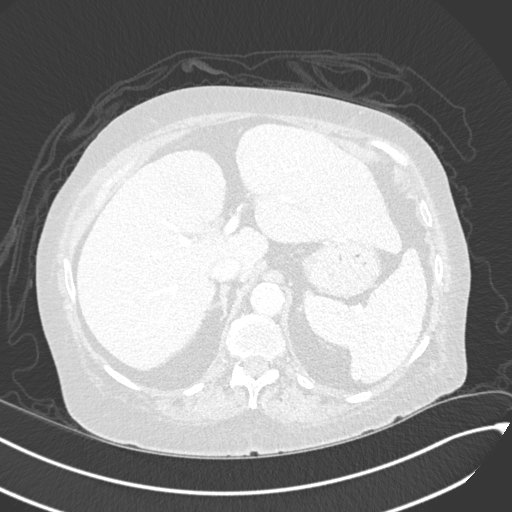
[im 32/146  lung]
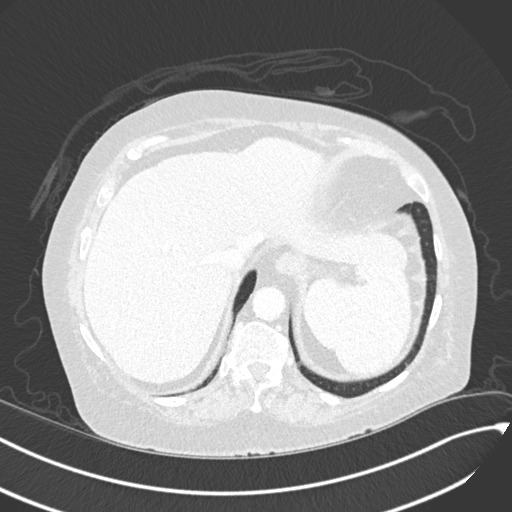
[im 52/146  lung]
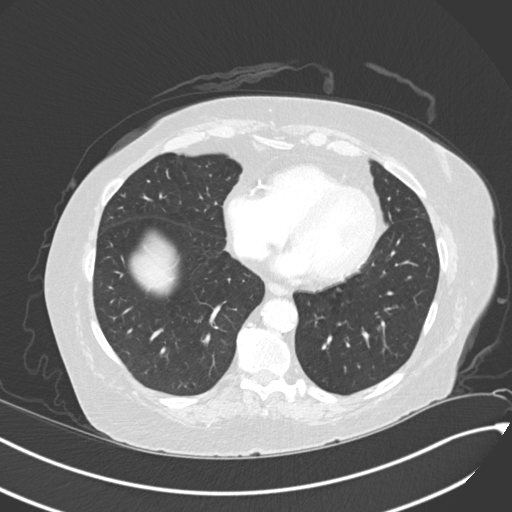
[im 63/146  mediastinal]
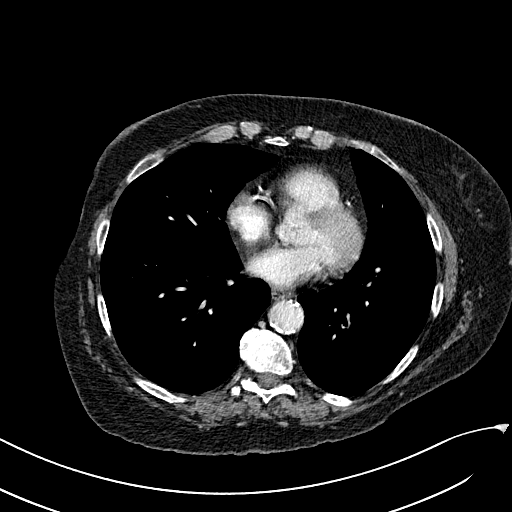
[im 63/146  lung]
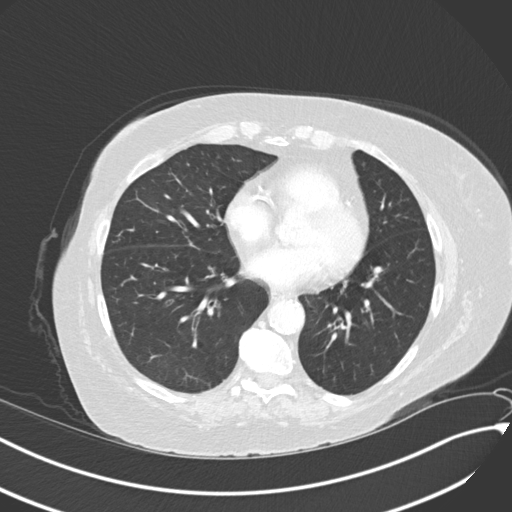
[im 73/146  lung]
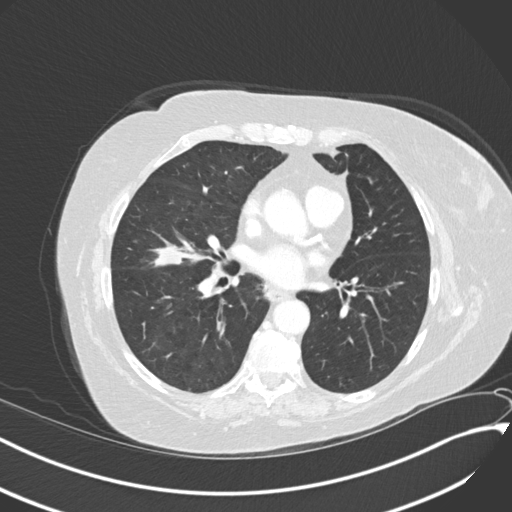
[im 83/146  lung]
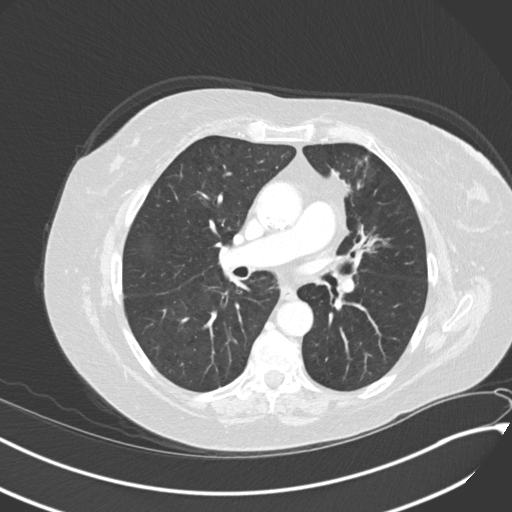
[im 94/146  lung]
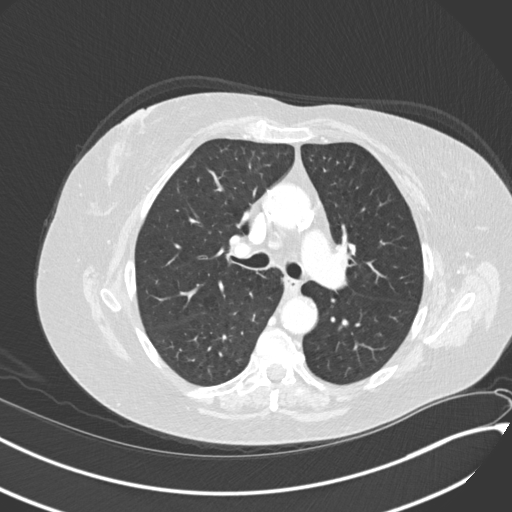
[im 114/146  mediastinal]
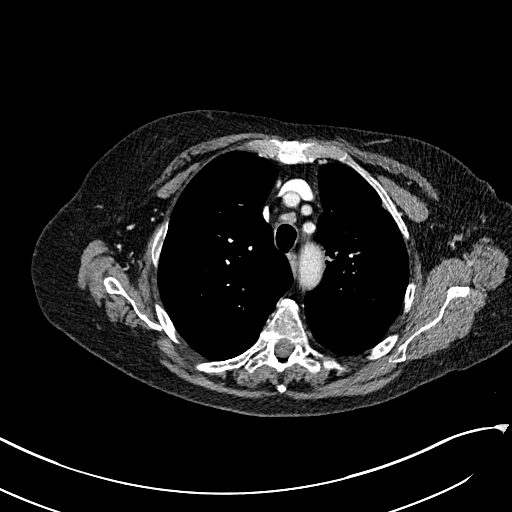
[im 114/146  lung]
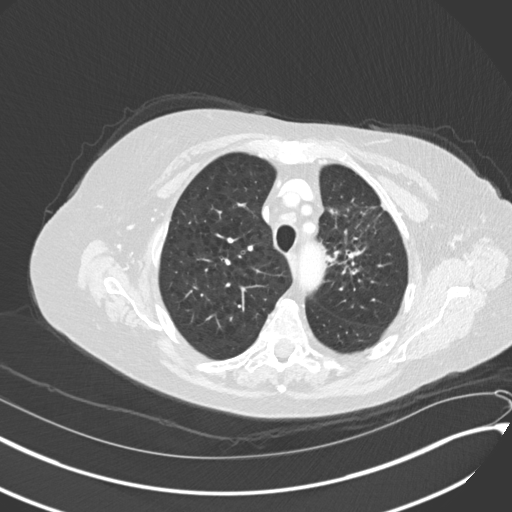
[im 125/146  lung]
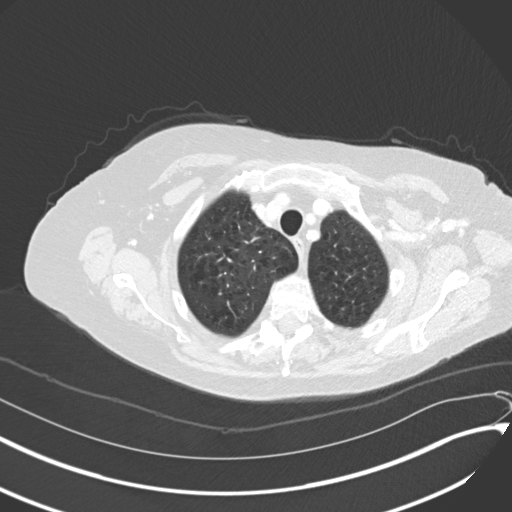
[im 135/146  lung]
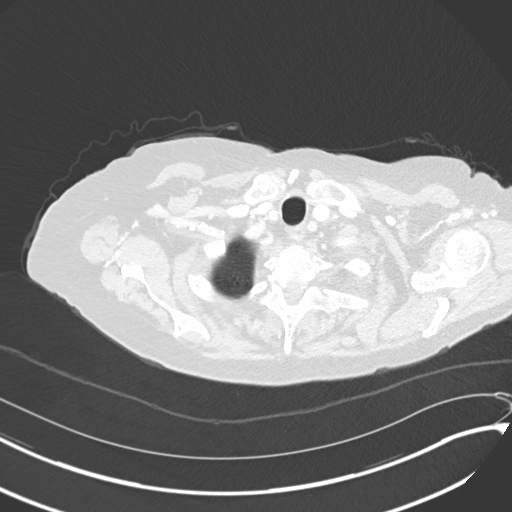

[Series 5: coronal · coronal · 0.60mm/px · 3 of 132 slices shown]
[im 27/132  lung]
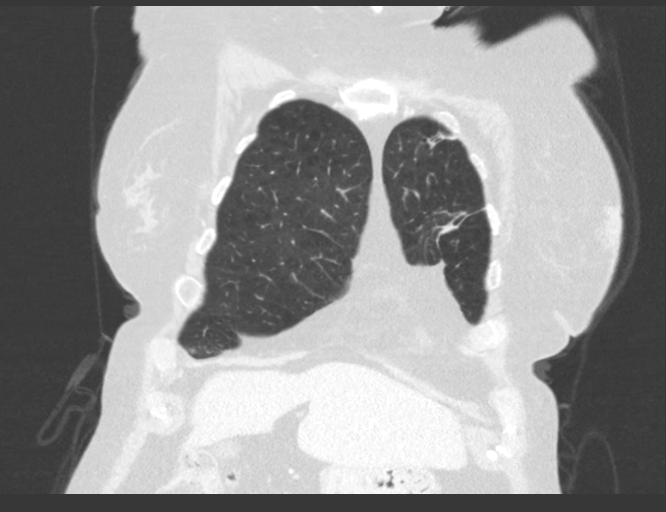
[im 53/132  lung]
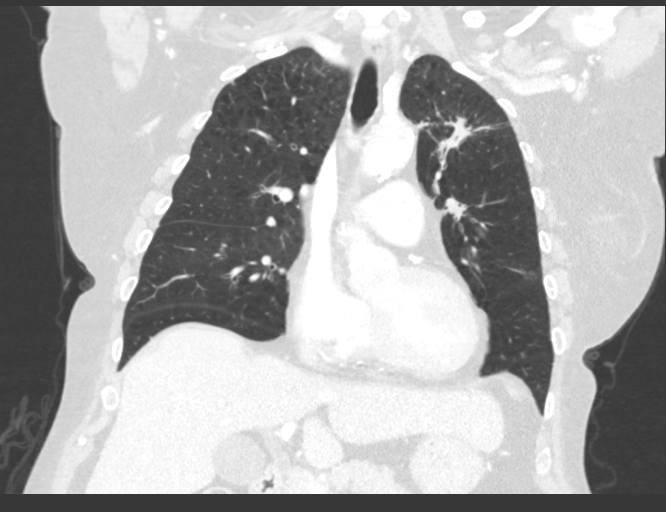
[im 79/132  lung]
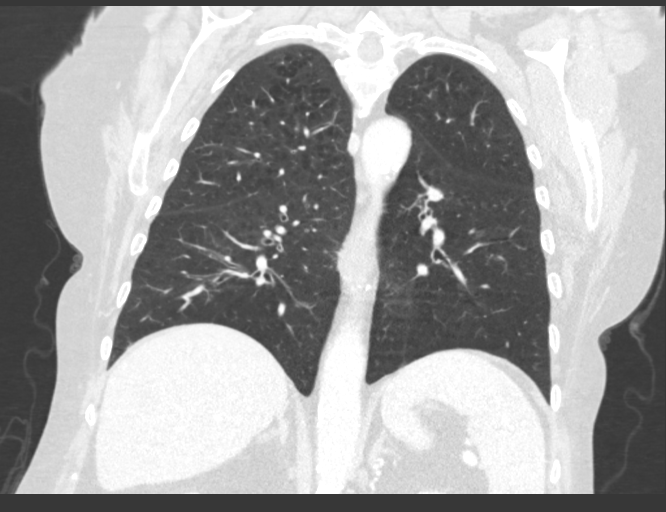

[14 of 36 positions shown; findings below may reference images not displayed]

RADIATION DOSE REDUCTION: This exam was performed according to the
departmental dose-optimization program which includes automated
exposure control, adjustment of the mA and/or kV according to
patient size and/or use of iterative reconstruction technique.

CONTRAST:  75mL OMNIPAQUE IOHEXOL 300 MG/ML  SOLN
FINDINGS: Cardiovascular: No acute vascular findings are seen. There is
diffuse atherosclerosis of the aorta, great vessels and coronary
arteries. The heart size is normal. There is no pericardial
effusion.

Mediastinum/Nodes: A right paratracheal node has mildly enlarged,
measuring 8 mm short axis on image 37/2 (previously 6 mm). No other
enlarged mediastinal, hilar or axillary lymph nodes identified.
Evidence of previous thyroidectomy.The esophagus and trachea
demonstrate no significant findings. Patient has been extubated
since the previous diagnostic CT.

Lungs/Pleura: There is no pleural effusion. Moderate centrilobular
and paraseptal emphysema with diffuse central airway thickening. The
spiculated right middle lobe nodule has slightly decreased in size,
now measuring 1.9 x 1.4 cm on image 73/7 (compared with
approximately 2.4 x 2.1 cm on the PET-CT). Chronic scarring in the
lingula, extending anteriorly from the left hilum appears unchanged.
More superiorly in the left upper lobe, there is new focal airspace
disease or new ill-defined mass measuring approximately 3.5 x 1.8 cm
on image 38/7. Although this appears somewhat mass-like on sagittal
image 120/6, there was no significant abnormality in this area
previously, and therefore this is probably an area of pneumonia or
post treatment change if radiation has been performed in this area.
No other suspicious pulmonary nodules.

Upper abdomen: The visualized upper abdomen appears stable without
suspicious findings. Stable mild nodularity of the lateral limb the
left adrenal gland, consistent with a benign finding based on
stability.

Musculoskeletal/Chest wall: There is no chest wall mass or
suspicious osseous finding. Mild spondylosis. Atrophy of the right
shoulder musculature.
IMPRESSION: 1. The previously demonstrated hypermetabolic right middle lobe
lesion appears slightly smaller, consistent with response to
therapy.
2. New focal airspace disease or ill-defined mass in the left upper
lobe, favored to reflect pneumonia or post radiation change.
Correlate clinically. Recommend short-term radiographic follow-up to
exclude developing mass lesion.
3. More inferiorly, there is stable scarring in the lingula.
4. Interval minimal enlargement of a right paratracheal lymph node,
still within normal limits for size. No other adenopathy.
5. Coronary and Aortic Atherosclerosis ([70]-[70]). Aortic
Atherosclerosis ([70]-[70]) and Emphysema ([70]-[70]).

## 2021-11-30 MED ORDER — SODIUM CHLORIDE (PF) 0.9 % IJ SOLN
INTRAMUSCULAR | Status: AC
  Filled 2021-11-30: qty 50

## 2021-11-30 MED ORDER — IOHEXOL 300 MG/ML  SOLN
75.0000 mL | Freq: Once | INTRAMUSCULAR | Status: AC | PRN
  Administered 2021-11-30: 75 mL via INTRAVENOUS

## 2021-12-06 ENCOUNTER — Telehealth: Payer: Self-pay | Admitting: Radiation Oncology

## 2021-12-06 ENCOUNTER — Telehealth: Payer: Self-pay | Admitting: Internal Medicine

## 2021-12-06 DIAGNOSIS — C3412 Malignant neoplasm of upper lobe, left bronchus or lung: Secondary | ICD-10-CM

## 2021-12-06 DIAGNOSIS — C7951 Secondary malignant neoplasm of bone: Secondary | ICD-10-CM

## 2021-12-06 DIAGNOSIS — C342 Malignant neoplasm of middle lobe, bronchus or lung: Secondary | ICD-10-CM

## 2021-12-06 MED ORDER — OXYCODONE HCL 5 MG PO TABS: 2.5000 mg | ORAL_TABLET | ORAL | 0 refills | Status: DC | PRN

## 2021-12-06 NOTE — Telephone Encounter (Signed)
Scheduled appt per 2/20 referral. Pt is aware of appt date and time. Pt is aware to arrive 15 mins prior to appt time and to bring and updated insurance card. Pt is aware of appt location.

## 2021-12-06 NOTE — Telephone Encounter (Signed)
I spoke with the patient's daughter today and discussed clinical situation which includes her mother's recent diagnosis of tibial fracture.  This is felt to be pathologic and related to her history of lung cancer.  Her recent CT of the chest also shows paratracheal adenopathy.  We discussed the rationale for proceeding surgically, postoperatively treating with palliative radiotherapy, but more surgery first, and simultaneously establishing with medical oncology.  Her daughter states that her mother desires to be seen at [facility]. Stat referral will be placed.  For pain management is also inadequate with tramadol and Tylenol.  We discussed the rationale for oxycodone and while she has had nausea in the past we can manage the symptoms if she develops this.  A new prescription will be sent to her pharmacy.

## 2021-12-07 ENCOUNTER — Telehealth: Payer: Self-pay | Admitting: Radiation Oncology

## 2021-12-07 ENCOUNTER — Encounter: Payer: Self-pay | Admitting: *Deleted

## 2021-12-07 ENCOUNTER — Other Ambulatory Visit: Payer: Medicare Other | Admitting: Hospice

## 2021-12-07 ENCOUNTER — Other Ambulatory Visit: Payer: Self-pay

## 2021-12-07 DIAGNOSIS — R5383 Other fatigue: Secondary | ICD-10-CM | POA: Diagnosis not present

## 2021-12-07 DIAGNOSIS — J449 Chronic obstructive pulmonary disease, unspecified: Secondary | ICD-10-CM | POA: Diagnosis not present

## 2021-12-07 DIAGNOSIS — Z8616 Personal history of COVID-19: Secondary | ICD-10-CM | POA: Diagnosis not present

## 2021-12-07 DIAGNOSIS — D638 Anemia in other chronic diseases classified elsewhere: Secondary | ICD-10-CM | POA: Diagnosis not present

## 2021-12-07 DIAGNOSIS — F411 Generalized anxiety disorder: Secondary | ICD-10-CM | POA: Diagnosis not present

## 2021-12-07 DIAGNOSIS — G4733 Obstructive sleep apnea (adult) (pediatric): Secondary | ICD-10-CM | POA: Diagnosis not present

## 2021-12-07 DIAGNOSIS — R918 Other nonspecific abnormal finding of lung field: Secondary | ICD-10-CM | POA: Diagnosis not present

## 2021-12-07 DIAGNOSIS — C342 Malignant neoplasm of middle lobe, bronchus or lung: Secondary | ICD-10-CM

## 2021-12-07 DIAGNOSIS — J9611 Chronic respiratory failure with hypoxia: Secondary | ICD-10-CM | POA: Diagnosis not present

## 2021-12-07 DIAGNOSIS — Z515 Encounter for palliative care: Secondary | ICD-10-CM

## 2021-12-07 DIAGNOSIS — G8929 Other chronic pain: Secondary | ICD-10-CM

## 2021-12-07 NOTE — Progress Notes (Signed)
[facility] Palliative Care Consult Note Telephone: [phone]  Fax: [phone]  PATIENT NAME: Holly [name] [id number] [phone] (home) [phone] (work) DOB: 1946-07-09 MRN: [id number]  PRIMARY CARE PROVIDER:    Harlan Stains, MD,  [address] [place] [address] [phone]  REFERRING PROVIDER:   Harlan Stains, MD [place],  [place] [address] [phone]  RESPONSIBLE PARTY:   Self/[name] Contact Information     Name Relation Home Work Mobile   [facility] Daughter [phone]  [phone]   [name] [phone]  [phone]      Palliative Care was asked to follow this patient to address advance care planning, complex medical decision making and goals of care clarification.  [name] is with patient during visit    ASSESSMENT AND / RECOMMENDATIONS:   CODE STATUS: Patient is a Do Not Resuscitate  Goals of Care: Goals include to maximize quality of life and symptom management. Patientinterested in hospice service in the future.   Symptom Management/Plan: Lung CA: Metastasis to left knee joint. Appointment with Oncology 12/28/2021 for possible chemo/radiation. Ortho surgeon to call and schedule surgery for surgery on fracture to left leg.  COPD: Continue 3 L/Min continuous; Albuterol, Trilegy CPAP. CHF: Managed with Lasix. Continue Torsemide as ordered. Elevate BLE during the day as much as possible to promote circulation. Adhere to Fluid and salt limits. Monitor weight closely and report weight gain of 3 Ibs in a day or 5 Ibs in a week  to eval for additional diuresis. Constipation: Use OTC Colace and Miralax Protein caloric malnutrition: Weight loss: Current weight is 138 down from 156 Ibs 6 months ago. Patient reports appetite improved - good.  Latest Reference Range & Units Most Recent  Albumin 3.5 - 5.0 g/dL 1.8 (L) 08/30/21 03:29   (L): Data is abnormally low Take Glucerna BID. Encourage incorporating fruits and vegetables.  Provide enough time and assistance during meals. Offer 4-6 small meals. Left knee pain: Managed with Prednisone, Tramadol, Tylenol, Oxycodone started yesterday from Oncology. Colace and Miralax on hand. Followed by Ortho. Follow up: Palliative care will continue to follow for complex medical decision making, advance care planning, and clarification of goals. Return 6 weeks or prn. Encouraged to call provider sooner with any concerns.   Family /Caregiver/Community Supports: Patient lives at home with her daughter Enzo Montgomery ELIGIBILITY/DIAGNOSIS: TBD  Chief Complaint: Follow up visit  HISTORY OF PRESENT ILLNESS:  Holly [name] is a 76 y.o. year old female  with multiple medical conditions including metastatic right lung cancer, COPD, CHF, right/left knee pain.  Patient endorsed occasional pain in her left knee, shortness of breath on exertion, in no respiratory distress currently and no recent COPD exacerbation.  Continuous oxygen during supplementation at 3 L/Min is helpful. History obtained from review of EMR, discussion with primary team, caregiver, family and/or Ms. Feldner.  Review and summarization of Epic records shows history from other than patient. Rest of 10 point ROS asked and negative.  I reviewed as needed, available labs, patient records, imaging, studies and related documents from the EMR.  Physical Exam: Height/Weight: 5 feet/138 Ibs Constitutional: NAD General: Well groomed, cooperative EYES: anicteric sclera, lids intact, no discharge  ENMT: Moist mucous membrane CV: S1 S2, RRR, trace edema to BLE edema Pulmonary: no increased work of breathing, no cough, 02 supplementation 3L/Min Abdomen: active BS + 4 quadrants, soft and non tender GU: no suprapubic tenderness  MSK: weakness, limited ROM, non ambulatory Skin: warm and dry, no rashes or wounds on visible skin Neuro:   weakness, otherwise non focal Psych: non-anxious affect Hem/lymph/immuno: no widespread bruising  PAST MEDICAL HISTORY:  Active Ambulatory Problems    Diagnosis Date Noted   CARCINOMA, BASAL CELL, 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   HYPERLIPIDEMIA, MIXED 05/13/2010   HYPERCALCEMIA 05/13/2010   Anxiety state 05/13/2010   Essential hypertension 05/13/2010   COPD GOLD III  06/11/2010   CERVICALGIA 05/13/2010   OSTEOPENIA 05/13/2010   URINARY INCONTINENCE 05/13/2010   CARCINOMA, BASAL CELL, 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   COPD exacerbation ([place]) 02/11/2013   Pedal edema 02/11/2013   Multinodular thyroid 07/30/2014   Neoplasm of uncertain behavior of thyroid gland, 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   Cigarette smoker 10/16/2014   Chronic respiratory failure with hypoxia ([place]) 11/11/2014   Cough 11/11/2015   Closed fracture of right olecranon process 12/04/2017   Primary malignant neoplasm of left upper lobe of lung ([place]) 07/09/2019   Ulna, olecranon process fracture, right, closed, with routine healing, subsequent encounter 10/01/2020   Olecranon bursitis of right elbow 10/02/2020   GI bleed 02/16/2021   ABLA (acute blood loss anemia) 02/16/2021   Diverticulitis 02/16/2021   Current chronic use of systemic steroids 02/16/2021   Pressure injury of skin 02/16/2021   Acute renal failure superimposed on stage 3a chronic kidney disease ([place]) 02/22/2021   Hypotension 02/23/2021   Hypokalemia 02/23/2021   Acute on chronic respiratory failure ([place]) 07/23/2021   Goals of care, counseling/discussion    Acute respiratory failure with hypoxia and hypercapnia (HCC)    Septic shock (HCC)    Acute on chronic respiratory failure with hypoxia and hypercapnia ([place]) 08/28/2021   Anemia due to chronic kidney disease 74/25/9563   Acute metabolic encephalopathy 87/56/4332   GERD (gastroesophageal reflux disease) 08/28/2021   DNR (do not resuscitate) 08/28/2021   SIRS (systemic inflammatory response syndrome) ([place]) 08/28/2021   E coli  bacteremia 08/29/2021   Sepsis secondary to UTI ([place]) 08/29/2021   Malignant neoplasm of middle lobe of right lung ([place]) 09/06/2021   Resolved Ambulatory Problems    Diagnosis Date Noted   DYSPNEA ON EXERTION 05/14/2010   Past Medical History:  Diagnosis Date   Anxiety    Asthma    Basal cell carcinoma    Bruises easily    Cataract    COPD (chronic obstructive pulmonary disease) (HCC)    Dyspnea    Foot swelling    Frequency of urination    Headache    History of skin cancer    Hypertension    Hypothyroidism    Pneumonia    Pulmonary nodule    Sleep apnea    Thyroid neoplasm    Urinary incontinence    Wears dentures    Wears glasses     SOCIAL HX:  Social History   Tobacco Use   Smoking status: Former    Packs/day: 1.00    Years: 30.00    Pack years: 30.00    Types: Cigarettes    Quit date: 02/24/2015    Years since quitting: 6.7   Smokeless tobacco: Never  Substance Use Topics   Alcohol use: Yes    Alcohol/week: 0.0 standard drinks    Comment: very seldom     FAMILY HX:  Family History  Problem Relation Age of Onset   Coronary artery disease Father    Hypertension Father    Asthma Father    Allergies Father    Hypertension Mother  Lung cancer Mother       ALLERGIES:  Allergies  Allergen Reactions   Amlodipine Besylate Swelling    Edema   Codeine Nausea And Vomiting   Oxycodone-Acetaminophen Nausea And Vomiting      PERTINENT MEDICATIONS:  Outpatient Encounter Medications as of 12/07/2021  Medication Sig   acetaminophen (TYLENOL) 325 MG tablet Take 650 mg by mouth every 6 (six) hours as needed for mild pain.   ALPRAZolam (XANAX) 0.5 MG tablet Take 0.5 mg by mouth 2 (two) times daily as needed for anxiety.   atorvastatin (LIPITOR) 20 MG tablet Take 20 mg by mouth at bedtime.    buPROPion (WELLBUTRIN XL) 300 MG 24 hr tablet Take 300 mg by mouth every morning.   busPIRone (BUSPAR) 5 MG tablet Take 1 tablet (5 mg total) by mouth 3 (three) times  daily.   Docusate Sodium (COLACE PO) Take 1-2 tablets by mouth daily.   donepezil (ARICEPT) 10 MG tablet Take 10 mg by mouth at bedtime.   Fluticasone-Umeclidin-Vilant 100-62.5-25 MCG/INH AEPB Inhale 1 puff into the lungs daily. Trelegy   furosemide (LASIX) 20 MG tablet Take 1 tablet (20 mg total) by mouth every other day.   levothyroxine (SYNTHROID) 137 MCG tablet Take 137 mcg by mouth daily before breakfast.   losartan (COZAAR) 50 MG tablet Take 1 tablet (50 mg total) by mouth daily.   metoprolol succinate (TOPROL-XL) 50 MG 24 hr tablet Take 50 mg by mouth daily. Take with or immediately following a meal.   ondansetron (ZOFRAN) 4 MG tablet Take 1 tablet (4 mg total) by mouth every 8 (eight) hours as needed for nausea or vomiting.   oxyCODONE (OXY IR/ROXICODONE) 5 MG immediate release tablet Take 0.5-1 tablets (2.5-5 mg total) by mouth every 4 (four) hours as needed for severe pain.   pantoprazole (PROTONIX) 40 MG tablet Take 1 tablet (40 mg total) by mouth daily.   polyethylene glycol (MIRALAX / GLYCOLAX) 17 g packet Take 17 g by mouth daily as needed for moderate constipation. (Patient not taking: Reported on 08/28/2021)   predniSONE (DELTASONE) 20 MG tablet Take 40 mg by mouth daily. Take 40mg  daily for five days (08/25/21-08/29/21) then go back to 20mg  daily continuous.   VENTOLIN HFA 108 (90 Base) MCG/ACT inhaler INHALE 2 PUFFS INTO THE LUNGS EVERY 4 HOURS AS NEEDED FOR WHEEZING ORSHORTNESS OF BREATH (Patient taking differently: Inhale 2 puffs into the lungs every 4 (four) hours as needed for wheezing or shortness of breath.)   No facility-administered encounter medications on file as of 12/07/2021.   I spent 60 minutes providing this consultation; time includes spent with patient/family, chart review and documentation. More than 50% of the time in this consultation was spent on care coordination   Thank you for the opportunity to participate in the care of Ms. Elena.  The palliative care  team will continue to follow. Please call our office at [phone] if we can be of additional assistance.   Note: Portions of this note were generated with [employer]. Dictation errors may occur despite best attempts at proofreading.  Teodoro Spray, NP

## 2021-12-07 NOTE — Progress Notes (Signed)
I received a call from patient's daughter I scheduled patient to be seen this week with Dr. Julien Nordmann. She verbalized understanding.

## 2021-12-07 NOTE — Telephone Encounter (Signed)
I called the patient as well to review discussion I had with her daughter yesterday but I had to leave a voicemail asking her to call back.

## 2021-12-07 NOTE — Progress Notes (Signed)
Oncology Nurse Navigator Documentation  Oncology Nurse Navigator Flowsheets 12/07/2021 09/25/2019 07/04/2019  Abnormal Finding Date - - 04/25/2019  Confirmed Diagnosis Date - - 06/25/2019  Diagnosis Status - - Confirmed Diagnosis Complete  Planned Course of Treatment - - Radiation  Navigator Follow Up Date: 12/08/2021 - 07/10/2019  Navigator Follow Up Reason: Patient Call - Appointment Review  Navigation Complete Date: - 09/25/2019 -  Reason Not Navigating Patient: - Other: -  Navigator Location [place]  Referral Date to RadOnc/MedOnc - - 07/04/2019  Navigator Encounter Type Telephone/I received a message from [place] that patient needs to be seen sooner by Med Onc. I called patient to schedule but was unable to reach. I did leave a vm message with my name and phone number to call.  - Telephone  Telephone Outgoing Call - Outgoing Call  Patient Visit Type Other - -  Treatment Phase Pre-Tx/Tx Discussion - Pre-Tx/Tx Discussion  Barriers/Navigation Needs Coordination of Care;Education - Education  Education Other - Other  Interventions Coordination of Care;Education - Education  Acuity Level 2-Minimal Needs (1-2 Barriers Identified) - Level 2-Minimal Needs (1-2 Barriers Identified)  Coordination of Care Other - -  Education Method - - Verbal  Time Spent with Patient 15 - 30

## 2021-12-08 DIAGNOSIS — F325 Major depressive disorder, single episode, in full remission: Secondary | ICD-10-CM | POA: Diagnosis not present

## 2021-12-08 DIAGNOSIS — G4700 Insomnia, unspecified: Secondary | ICD-10-CM | POA: Diagnosis not present

## 2021-12-08 DIAGNOSIS — I5033 Acute on chronic diastolic (congestive) heart failure: Secondary | ICD-10-CM | POA: Diagnosis not present

## 2021-12-08 DIAGNOSIS — E785 Hyperlipidemia, unspecified: Secondary | ICD-10-CM | POA: Diagnosis not present

## 2021-12-08 DIAGNOSIS — I129 Hypertensive chronic kidney disease with stage 1 through stage 4 chronic kidney disease, or unspecified chronic kidney disease: Secondary | ICD-10-CM | POA: Diagnosis not present

## 2021-12-08 DIAGNOSIS — J449 Chronic obstructive pulmonary disease, unspecified: Secondary | ICD-10-CM | POA: Diagnosis not present

## 2021-12-08 DIAGNOSIS — N183 Chronic kidney disease, stage 3 unspecified: Secondary | ICD-10-CM | POA: Diagnosis not present

## 2021-12-08 DIAGNOSIS — E890 Postprocedural hypothyroidism: Secondary | ICD-10-CM | POA: Diagnosis not present

## 2021-12-09 ENCOUNTER — Inpatient Hospital Stay: Payer: Medicare Other

## 2021-12-09 ENCOUNTER — Ambulatory Visit: Payer: Self-pay | Admitting: Student

## 2021-12-09 ENCOUNTER — Other Ambulatory Visit: Payer: Self-pay

## 2021-12-09 ENCOUNTER — Inpatient Hospital Stay: Payer: Medicare Other | Attending: Internal Medicine | Admitting: Internal Medicine

## 2021-12-09 VITALS — BP 105/85 | HR 63 | Temp 98.0°F | Resp 18 | Ht 60.0 in | Wt 137.4 lb

## 2021-12-09 DIAGNOSIS — G4730 Sleep apnea, unspecified: Secondary | ICD-10-CM | POA: Insufficient documentation

## 2021-12-09 DIAGNOSIS — E890 Postprocedural hypothyroidism: Secondary | ICD-10-CM | POA: Diagnosis not present

## 2021-12-09 DIAGNOSIS — C3490 Malignant neoplasm of unspecified part of unspecified bronchus or lung: Secondary | ICD-10-CM | POA: Diagnosis not present

## 2021-12-09 DIAGNOSIS — J432 Centrilobular emphysema: Secondary | ICD-10-CM | POA: Diagnosis not present

## 2021-12-09 DIAGNOSIS — C3412 Malignant neoplasm of upper lobe, left bronchus or lung: Secondary | ICD-10-CM | POA: Insufficient documentation

## 2021-12-09 DIAGNOSIS — C342 Malignant neoplasm of middle lobe, bronchus or lung: Secondary | ICD-10-CM

## 2021-12-09 DIAGNOSIS — I10 Essential (primary) hypertension: Secondary | ICD-10-CM | POA: Diagnosis not present

## 2021-12-09 DIAGNOSIS — F419 Anxiety disorder, unspecified: Secondary | ICD-10-CM | POA: Insufficient documentation

## 2021-12-09 DIAGNOSIS — Z836 Family history of other diseases of the respiratory system: Secondary | ICD-10-CM | POA: Diagnosis not present

## 2021-12-09 DIAGNOSIS — Z87891 Personal history of nicotine dependence: Secondary | ICD-10-CM | POA: Diagnosis not present

## 2021-12-09 DIAGNOSIS — Z801 Family history of malignant neoplasm of trachea, bronchus and lung: Secondary | ICD-10-CM | POA: Insufficient documentation

## 2021-12-09 DIAGNOSIS — K5900 Constipation, unspecified: Secondary | ICD-10-CM | POA: Diagnosis not present

## 2021-12-09 DIAGNOSIS — Z885 Allergy status to narcotic agent status: Secondary | ICD-10-CM | POA: Diagnosis not present

## 2021-12-09 DIAGNOSIS — M8580 Other specified disorders of bone density and structure, unspecified site: Secondary | ICD-10-CM | POA: Diagnosis not present

## 2021-12-09 DIAGNOSIS — Z79899 Other long term (current) drug therapy: Secondary | ICD-10-CM | POA: Insufficient documentation

## 2021-12-09 DIAGNOSIS — Z85820 Personal history of malignant melanoma of skin: Secondary | ICD-10-CM | POA: Diagnosis not present

## 2021-12-09 DIAGNOSIS — Z8249 Family history of ischemic heart disease and other diseases of the circulatory system: Secondary | ICD-10-CM | POA: Diagnosis not present

## 2021-12-09 LAB — CBC WITH DIFFERENTIAL (CANCER CENTER ONLY)
Abs Immature Granulocytes: 0.09 10*3/uL — ABNORMAL HIGH (ref 0.00–0.07)
Basophils Absolute: 0 10*3/uL (ref 0.0–0.1)
Basophils Relative: 0 %
Eosinophils Absolute: 0.2 10*3/uL (ref 0.0–0.5)
Eosinophils Relative: 2 %
HCT: 33.6 % — ABNORMAL LOW (ref 36.0–46.0)
Hemoglobin: 9.7 g/dL — ABNORMAL LOW (ref 12.0–15.0)
Immature Granulocytes: 1 %
Lymphocytes Relative: 29 %
Lymphs Abs: 3.4 10*3/uL (ref 0.7–4.0)
MCH: 28.1 pg (ref 26.0–34.0)
MCHC: 28.9 g/dL — ABNORMAL LOW (ref 30.0–36.0)
MCV: 97.4 fL (ref 80.0–100.0)
Monocytes Absolute: 0.9 10*3/uL (ref 0.1–1.0)
Monocytes Relative: 8 %
Neutro Abs: 6.9 10*3/uL (ref 1.7–7.7)
Neutrophils Relative %: 60 %
Platelet Count: 224 10*3/uL (ref 150–400)
RBC: 3.45 MIL/uL — ABNORMAL LOW (ref 3.87–5.11)
RDW: 18.1 % — ABNORMAL HIGH (ref 11.5–15.5)
WBC Count: 11.4 10*3/uL — ABNORMAL HIGH (ref 4.0–10.5)
nRBC: 0 % (ref 0.0–0.2)

## 2021-12-09 LAB — CMP (CANCER CENTER ONLY)
ALT: 12 U/L (ref 0–44)
AST: 12 U/L — ABNORMAL LOW (ref 15–41)
Albumin: 3.3 g/dL — ABNORMAL LOW (ref 3.5–5.0)
Alkaline Phosphatase: 88 U/L (ref 38–126)
Anion gap: 4 — ABNORMAL LOW (ref 5–15)
BUN: 23 mg/dL (ref 8–23)
CO2: 42 mmol/L — ABNORMAL HIGH (ref 22–32)
Calcium: 10.3 mg/dL (ref 8.9–10.3)
Chloride: 91 mmol/L — ABNORMAL LOW (ref 98–111)
Creatinine: 0.87 mg/dL (ref 0.44–1.00)
GFR, Estimated: >60 mL/min (ref >60)
Glucose, Bld: 107 mg/dL — ABNORMAL HIGH (ref 70–99)
Potassium: 3.7 mmol/L (ref 3.5–5.1)
Sodium: 137 mmol/L (ref 135–145)
Total Bilirubin: 0.4 mg/dL (ref 0.3–1.2)
Total Protein: 6.8 g/dL (ref 6.5–8.1)

## 2021-12-09 NOTE — Progress Notes (Signed)
[place] [phone]   [phone]  CONSULT NOTE  REFERRING PHYSICIAN: Dr. Kyung Rudd  REASON FOR CONSULTATION:  76 years old white female with history of lung cancer.  HPI Holly [name] is a 76 y.o. female with past medical history significant for anxiety, COPD, hypertension, dyslipidemia, hypothyroidism, melanoma of the nose, hypercalcemia, basal cell carcinoma, sleep apnea as well as thyroid neoplasm.  The patient had history of pulmonary nodules that has been going on for years at least since 2020 with left upper lobe pulmonary nodules in addition to right middle lobe nodule.  She was seen by Dr. Kipp Brood and on June 25, 2019 she underwent video bronchoscopy with endobronchial navigational bronchoscopy.  The final pathology from the left upper lobe transbronchial needle aspiration ([id number]) showed malignant cells consistent with non-small cell lung cancer, squamous cell carcinoma.  The fine-needle aspiration of the right middle lobe was negative for malignancy.  The patient was referred to Dr. Lisbeth Renshaw and she underwent SBRT to the left upper lobe lung nodule completed August 06, 2019.  She was followed by observation and close monitoring.  Repeat CT scan of the chest with contrast on February 04, 2020 showed the treated lesion in the anterior medial lingula is obscured by progressive changes of external beam radiation but the separate nodule within the left upper lobe slightly increased in size and measures 0.9 cm.  There was also stable right middle lobe lung nodule measuring 1.0 cm.  She then underwent SBRT to the enlarging left upper lobe lung nodule completed June 09, 2020.  Repeat CT scan of the chest on November 17, 2020 showed spiculated right middle lobe nodule stable but minimally enlarged compared to 2020 and remains suspicious for adenocarcinoma.  There was posttreatment scarring and architectural distortion in the left upper lobe.  Repeat CT  scan of the chest on July 24, 2021 showed increased size of a right middle lobe lung mass measuring 2.0 x 1.7 x 2.4 cm compared to 1.0 cm previously on Feb 22, 2021 and this was suspicious for a pulmonary neoplasm.  A PET scan on September 02, 2021 showed 2.4 cm hypermetabolic pulmonary nodule in the posterior right middle lobe consistent with primary bronchogenic carcinoma.  There was 0.6 cm pretracheal mediastinal lymph nodes with mild hypermetabolism and metastatic disease cannot be excluded.  No other sites of metastatic disease identified.  The patient underwent SBRT to the right middle lobe pulmonary nodule under the care of Dr. Lisbeth Renshaw completed on October 12, 2021.  The patient was complaining of pain and swelling in her extremities and she underwent CT of the left knee without contrast on November 25, 2021 and it showed acute comminuted lateral tibial plateau fracture with 6.5 mm of depression of the articular surface likely reflecting a pathologic fracture.  She also had numerous abnormal bone lesions involving the distal femur and proximal tibia consistent with metastatic disease.  Repeat CT scan of the chest on November 30, 2021 showed the previously demonstrated hypermetabolic right middle lobe lesion appeared slightly smaller consistent with response to therapy.  There was new focal airspace disease or ill-defined mass in the left upper lobe favored to reflect pneumonia or postradiation changes there was also more inferiorly stable scarring in the lingula.  The scan also showed interval minimal enlargement of her right paratracheal lymph node still within normal limits of size and no other adenopathy. She was referred to me today for evaluation and recommendation regarding her condition. When  seen today the patient is feeling fine except for pain on the left knee and she is scheduled to have intervention by her orthopedic surgeon with plate placement and biopsy of the lesion at the same time.  She  denied having any chest pain but has persistent cough with shortness of breath with exertion and no hemoptysis.  She has no nausea, vomiting, diarrhea but has constipation secondary to narcotic medications.  She has no headache or visual changes.   Family history significant for father with coronary artery disease and hypertension.  Mother had lung cancer at age 20. The patient is a widow and has 2 children.  She was accompanied today by her daughter Holly [name].  She used to work as a Secondary school teacher.  She has a history of smoking more than 1 pack/day for around 55 years and quit 8 years ago.  She has no history of alcohol or drug abuse.  HPI  Past Medical History:  Diagnosis Date   Anxiety    Asthma    Basal cell carcinoma    lung cancer.  Skin cancer- basal - nose removed   Bruises easily    Cataract    Cervicalgia    COPD (chronic obstructive pulmonary disease) (HCC)    emphysema and COPD   Dyspnea    uses O2 only when needed. Uses 2 L    Foot swelling    left- has shown to Drs.   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   Hypercalcemia    Hypertension    Hypothyroidism    Mixed hyperlipidemia    Pneumonia    Pulmonary nodule    Sleep apnea    does not wear cpap   Thyroid neoplasm    Urinary incontinence    Wears dentures    Wears glasses     Past Surgical History:  Procedure Laterality Date   BIOPSY  02/17/2021   Procedure: BIOPSY;  Surgeon: Otis Brace, MD;  Location: [facility];  Service: Gastroenterology;;   COLONOSCOPY     DENTAL SURGERY     ELBOW ARTHROPLASTY Right 2020   ESOPHAGOGASTRODUODENOSCOPY (EGD) WITH PROPOFOL N/A 02/17/2021   Procedure: ESOPHAGOGASTRODUODENOSCOPY (EGD) WITH PROPOFOL;  Surgeon: Otis Brace, MD;  Location: [facility];  Service: Gastroenterology;  Laterality: N/A;   EYE SURGERY Right    cataract surgery   FOOT SURGERY  30 YRS AGO   BILATERAL   HARDWARE REMOVAL Right 10/02/2020    Procedure: HARDWARE REMOVAL RIGHT ELBOW;  Surgeon: Shona Needles, MD;  Location: [facility];  Service: Orthopedics;  Laterality: Right;   I & D EXTREMITY Right 01/13/2021   Procedure: IRRIGATION AND DEBRIDEMENT RIGHT ELBOW;  Surgeon: Shona Needles, MD;  Location: [facility];  Service: Orthopedics;  Laterality: Right;   ORIF ELBOW FRACTURE Right 12/06/2017   Procedure: OPEN REDUCTION INTERNAL FIXATION (ORIF) ELBOW/OLECRANON FRACTURE;  Surgeon: Shona Needles, MD;  Location: [facility];  Service: Orthopedics;  Laterality: Right;   SKIN CANCER EXCISION     THYROIDECTOMY N/A 07/31/2014   Procedure: TOTAL THYROIDECTOMY;  Surgeon: Armandina Gemma, MD;  Location: [facility];  Service: General;  Laterality: N/A;   VAGINAL HYSTERECTOMY     VIDEO BRONCHOSCOPY WITH ENDOBRONCHIAL NAVIGATION Bilateral 06/25/2019   Procedure: VIDEO BRONCHOSCOPY WITH ENDOBRONCHIAL NAVIGATION;  Surgeon: Lajuana Matte, MD;  Location: [facility];  Service: Thoracic;  Laterality: Bilateral;    Family History  Problem Relation Age of  Onset   Coronary artery disease Father    Hypertension Father    Asthma Father    Allergies Father    Hypertension Mother    Lung cancer Mother     Social History Social History   Tobacco Use   Smoking status: Former    Packs/day: 1.00    Years: 30.00    Pack years: 30.00    Types: Cigarettes    Quit date: 02/24/2015    Years since quitting: 6.7   Smokeless tobacco: Never  Vaping Use   Vaping Use: Former  Substance Use Topics   Alcohol use: Yes    Alcohol/week: 0.0 standard drinks    Comment: very seldom   Drug use: Never    Allergies  Allergen Reactions   Amlodipine Besylate Swelling    Edema   Codeine Nausea And Vomiting   Oxycodone-Acetaminophen Nausea And Vomiting    Current Outpatient Medications  Medication Sig Dispense Refill   acetaminophen (TYLENOL) 325 MG tablet Take 650 mg by mouth every 6 (six) hours as needed for mild pain.     ALPRAZolam (XANAX) 0.5 MG tablet Take 0.5 mg by  mouth 2 (two) times daily as needed for anxiety.     atorvastatin (LIPITOR) 20 MG tablet Take 20 mg by mouth at bedtime.      buPROPion (WELLBUTRIN XL) 300 MG 24 hr tablet Take 300 mg by mouth every morning.     busPIRone (BUSPAR) 5 MG tablet Take 1 tablet (5 mg total) by mouth 3 (three) times daily. 30 tablet 3   Docusate Sodium (COLACE PO) Take 1-2 tablets by mouth daily.     donepezil (ARICEPT) 10 MG tablet Take 10 mg by mouth at bedtime.     Fluticasone-Umeclidin-Vilant 100-62.5-25 MCG/INH AEPB Inhale 1 puff into the lungs daily. Trelegy     furosemide (LASIX) 20 MG tablet Take 1 tablet (20 mg total) by mouth every other day. 30 tablet 3   levothyroxine (SYNTHROID) 137 MCG tablet Take 137 mcg by mouth daily before breakfast.     losartan (COZAAR) 50 MG tablet Take 1 tablet (50 mg total) by mouth daily. 30 tablet 3   metoprolol succinate (TOPROL-XL) 50 MG 24 hr tablet Take 50 mg by mouth daily. Take with or immediately following a meal.     ondansetron (ZOFRAN) 4 MG tablet Take 1 tablet (4 mg total) by mouth every 8 (eight) hours as needed for nausea or vomiting. 20 tablet 0   oxyCODONE (OXY IR/ROXICODONE) 5 MG immediate release tablet Take 0.5-1 tablets (2.5-5 mg total) by mouth every 4 (four) hours as needed for severe pain. 30 tablet 0   pantoprazole (PROTONIX) 40 MG tablet Take 1 tablet (40 mg total) by mouth daily. 30 tablet 3   polyethylene glycol (MIRALAX / GLYCOLAX) 17 g packet Take 17 g by mouth daily as needed for moderate constipation. (Patient not taking: Reported on 08/28/2021) 14 each 0   predniSONE (DELTASONE) 20 MG tablet Take 40 mg by mouth daily. Take 17m daily for five days (08/25/21-08/29/21) then go back to 220mdaily continuous.     VENTOLIN HFA 108 (90 Base) MCG/ACT inhaler INHALE 2 PUFFS INTO THE LUNGS EVERY 4 HOURS AS NEEDED FOR WHEEZING ORSHORTNESS OF BREATH (Patient taking differently: Inhale 2 puffs into the lungs every 4 (four) hours as needed for wheezing or  shortness of breath.) 8 g 0   No current facility-administered medications for this visit.    Review of Systems  Constitutional: positive for  fatigue Eyes: negative Ears, nose, mouth, throat, and face: negative Respiratory: positive for cough and dyspnea on exertion Cardiovascular: negative Gastrointestinal: positive for constipation Genitourinary:negative Integument/breast: negative Hematologic/lymphatic: negative Musculoskeletal:negative Neurological: negative Behavioral/Psych: negative Endocrine: negative Allergic/Immunologic: negative  Physical Exam  YCX:KGYJE, healthy, no distress, well nourished, and well developed SKIN: skin color, texture, turgor are normal, no rashes or significant lesions HEAD: Normocephalic, No masses, lesions, tenderness or abnormalities EYES: normal, PERRLA, Conjunctiva are pink and non-injected EARS: External ears normal, Canals clear OROPHARYNX:no exudate, no erythema, and lips, buccal mucosa, and tongue normal  NECK: supple, no adenopathy, no JVD LYMPH:  no palpable lymphadenopathy, no hepatosplenomegaly BREAST:not examined LUNGS: clear to auscultation , and palpation HEART: regular rate & rhythm, no murmurs, and no gallops ABDOMEN:abdomen soft, non-tender, normal bowel sounds, and no masses or organomegaly BACK: Back symmetric, no curvature., No CVA tenderness EXTREMITIES:no joint deformities, effusion, or inflammation, no edema  NEURO: alert & oriented x 3 with fluent speech, no focal motor/sensory deficits  PERFORMANCE STATUS: ECOG 1  LABORATORY DATA: Lab Results  Component Value Date   WBC 11.4 (H) 12/09/2021   HGB 9.7 (L) 12/09/2021   HCT 33.6 (L) 12/09/2021   MCV 97.4 12/09/2021   PLT 224 12/09/2021      Chemistry      Component Value Date/Time   NA 139 08/31/2021 0105   K 4.7 08/31/2021 0105   CL 96 (L) 08/31/2021 0105   CO2 35 (H) 08/31/2021 0105   BUN 24 (H) 08/31/2021 0105   CREATININE 1.00 11/30/2021 1410    CREATININE 0.91 07/21/2020 1443      Component Value Date/Time   CALCIUM 9.1 08/31/2021 0105   ALKPHOS 84 08/30/2021 0329   AST 11 (L) 08/30/2021 0329   ALT 12 08/30/2021 0329   BILITOT 0.4 08/30/2021 0329       RADIOGRAPHIC STUDIES: CT CHEST W CONTRAST  Result Date: 12/01/2021 CLINICAL DATA:  Non-small cell lung cancer post SB RT. EXAM: CT CHEST WITH CONTRAST TECHNIQUE: Multidetector CT imaging of the chest was performed during intravenous contrast administration. RADIATION DOSE REDUCTION: This exam was performed according to the departmental dose-optimization program which includes automated exposure control, adjustment of the mA and/or kV according to patient size and/or use of iterative reconstruction technique. CONTRAST:  59m OMNIPAQUE IOHEXOL 300 MG/ML  SOLN COMPARISON:  PET-CT 09/02/2021 and CT 07/24/2021. FINDINGS: Cardiovascular: No acute vascular findings are seen. There is diffuse atherosclerosis of the aorta, great vessels and coronary arteries. The heart size is normal. There is no pericardial effusion. Mediastinum/Nodes: A right paratracheal node has mildly enlarged, measuring 8 mm short axis on image 37/2 (previously 6 mm). No other enlarged mediastinal, hilar or axillary lymph nodes identified. Evidence of previous thyroidectomy.The esophagus and trachea demonstrate no significant findings. Patient has been extubated since the previous diagnostic CT. Lungs/Pleura: There is no pleural effusion. Moderate centrilobular and paraseptal emphysema with diffuse central airway thickening. The spiculated right middle lobe nodule has slightly decreased in size, now measuring 1.9 x 1.4 cm on image 73/7 (compared with approximately 2.4 x 2.1 cm on the PET-CT). Chronic scarring in the lingula, extending anteriorly from the left hilum appears unchanged. More superiorly in the left upper lobe, there is new focal airspace disease or new ill-defined mass measuring approximately 3.5 x 1.8 cm on image  38/7. Although this appears somewhat mass-like on sagittal image 120/6, there was no significant abnormality in this area previously, and therefore this is probably an area of pneumonia or post treatment  change if radiation has been performed in this area. No other suspicious pulmonary nodules. Upper abdomen: The visualized upper abdomen appears stable without suspicious findings. Stable mild nodularity of the lateral limb the left adrenal gland, consistent with a benign finding based on stability. Musculoskeletal/Chest wall: There is no chest wall mass or suspicious osseous finding. Mild spondylosis. Atrophy of the right shoulder musculature. IMPRESSION: 1. The previously demonstrated hypermetabolic right middle lobe lesion appears slightly smaller, consistent with response to therapy. 2. New focal airspace disease or ill-defined mass in the left upper lobe, favored to reflect pneumonia or post radiation change. Correlate clinically. Recommend short-term radiographic follow-up to exclude developing mass lesion. 3. More inferiorly, there is stable scarring in the lingula. 4. Interval minimal enlargement of a right paratracheal lymph node, still within normal limits for size. No other adenopathy. 5. Coronary and Aortic Atherosclerosis (ICD10-I70.0). Aortic Atherosclerosis (ICD10-I70.0) and Emphysema (ICD10-J43.9). Electronically Signed   By: Richardean Sale M.D.   On: 12/01/2021 14:04   CT KNEE LEFT WO CONTRAST  Result Date: 11/27/2021 CLINICAL DATA:  History of history lung cancer.  Pain and swelling EXAM: CT OF THE LEFT KNEE WITHOUT CONTRAST TECHNIQUE: Multidetector CT imaging of the left knee was performed according to the standard protocol. Multiplanar CT image reconstructions were also generated. RADIATION DOSE REDUCTION: This exam was performed according to the departmental dose-optimization program which includes automated exposure control, adjustment of the mA and/or kV according to patient size and/or  use of iterative reconstruction technique. COMPARISON:  None. FINDINGS: Bones/Joint/Cartilage Generalized osteopenia acute comminuted lateral tibial plateau fracture with 6.5 mm of depression of the articular surface. No other acute fracture or dislocation.  Large hemarthrosis. Numerous abnormal lytic bone lesions involving the distal femoral diaphysis and metaphysis as well as the proximal tibial epiphysis and metaphysis consistent with metastatic disease. Ligaments Ligaments are suboptimally evaluated by CT. Muscles and Tendons Muscles are normal. No muscle atrophy. No intramuscular fluid collection or hematoma. Soft tissue No fluid collection or hematoma. No soft tissue mass. Quadriceps tendon and patellar tendon are intact. IMPRESSION: 1. Acute comminuted lateral tibial plateau fracture with 6.5 mm of depression of the articular surface (Schatzker III). This likely reflects a pathologic fracture. 2. Numerous abnormal bone lesions involving the distal femur and proximal tibia consistent with metastatic disease. These results will be called to the ordering clinician or representative by the Radiologist Assistant, and communication documented in the PACS or [employer]. Electronically Signed   By: Kathreen Devoid M.D.   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   PLAN: I had a lengthy discussion with the patient and her daughter today about her current disease stage, prognosis and treatment options.  I personally and independently reviewed the scan images and discussed the results with the patient and her daughter. I recommended for the  patient to have a bone scan performed for further evaluation of this bone metastasis in the distal left femur.  If needed we may consider repeating whole-body PET scan. The patient is scheduled to see her orthopedic surgeon soon for surgical intervention and plate placement for the left knee fracture.  She will have a biopsy done during the procedure for confirmation of her diagnosis. If the biopsy is negative, I will refer the patient to interventional radiology for consideration of CT-guided biopsy of one of the other suspicious bone lesions. If the biopsy is positive for squamous cell carcinoma, I will send it to be tested for PD-L1 expression. I will see the patient back for follow-up visit in around 4 weeks for evaluation and more detailed discussion of her treatment options based on the final biopsy and imaging studies. The patient was advised to call immediately if she has any other concerning symptoms in the interval.  The patient voices understanding of current disease status and treatment options and is in agreement with the current care plan.  All questions were answered. The patient knows to call the clinic with any problems, questions or concerns. We can certainly see the patient much sooner if necessary.  Thank you so much for allowing me to participate in the care of Holly [name]. I will continue to follow up the patient with you and assist in her care.  The total time spent in the appointment was 60 minutes.  Disclaimer: This note was dictated with voice recognition software. Similar sounding words can inadvertently be transcribed and may not be corrected upon review.   Eilleen Kempf December 09, 2021, 11:02 AM

## 2021-12-10 ENCOUNTER — Encounter (HOSPITAL_COMMUNITY): Payer: Self-pay | Admitting: Student

## 2021-12-10 ENCOUNTER — Other Ambulatory Visit: Payer: Self-pay

## 2021-12-10 NOTE — Progress Notes (Signed)
Spoke with pt for pre-op call. Pt denies cardiac history. Pt does have respiratory hx, was in respiratory failure in October, 2022. Pt states she uses a cpap at night but uses it during the day because it helps her breathe easier. Pt does have lung cancer. Pt states she is not diabetic.   Covid test needed on arrival DOS.  Pt given pre-op instructions but she did ask that I call her daughter, Holly [name] and give her the instructions also. I did call Holly [name] and went over all the instructions. She voiced understanding and appreciation.

## 2021-12-10 NOTE — Anesthesia Preprocedure Evaluation (Addendum)
Anesthesia Evaluation  Patient identified by MRN, date of birth, ID band Patient awake    Reviewed: NPO status , Patient's Chart, lab work & pertinent test results  Airway Mallampati: II  TM Distance: >3 FB Neck ROM: Full    Dental  (+) Upper Dentures, Lower Dentures   Pulmonary asthma , sleep apnea and Continuous Positive Airway Pressure Ventilation , COPD (2L PRN),  oxygen dependent, former smoker,  3L ATC   Pulmonary exam normal        Cardiovascular hypertension, Pt. on medications and Pt. on home beta blockers  Rhythm:Regular Rate:Normal  Echo 07/25/21: IMPRESSIONS  1. Left ventricular ejection fraction, by estimation, is 65 to 70%. The  left ventricle has normal function. The left ventricle has no regional  wall motion abnormalities. Left ventricular diastolic parameters are  consistent with Grade I diastolic  dysfunction (impaired relaxation).  2. Right ventricular systolic function is normal. The right ventricular  size is normal. Tricuspid regurgitation signal is inadequate for assessing  PA pressure.  3. The mitral valve is normal in structure. Trivial mitral valve  regurgitation. No evidence of mitral stenosis.  4. The aortic valve is tricuspid. Aortic valve regurgitation is not  visualized. No aortic stenosis is present.     Neuro/Psych  Headaches, Anxiety Depression    GI/Hepatic Neg liver ROS, GERD  Medicated,  Endo/Other  Hypothyroidism   Renal/GU Renal disease  negative genitourinary   Musculoskeletal Left tibial plateau fx   Abdominal Normal abdominal exam  (+)   Peds  Hematology  (+) Blood dyscrasia, anemia ,   Anesthesia Other Findings   Reproductive/Obstetrics                           Anesthesia Physical Anesthesia Plan  ASA: 3  Anesthesia Plan: General   Post-op Pain Management: Tylenol PO (pre-op)* and Celebrex PO (pre-op)*   Induction:  Intravenous  PONV Risk Score and Plan: 3 and Ondansetron, Dexamethasone and Treatment may vary due to age or medical condition  Airway Management Planned: Mask and Oral ETT  Additional Equipment: None  Intra-op Plan:   Post-operative Plan: Extubation in OR  Informed Consent: I have reviewed the patients History and Physical, chart, labs and discussed the procedure including the risks, benefits and alternatives for the proposed anesthesia with the patient or authorized representative who has indicated his/her understanding and acceptance.     Dental advisory given  Plan Discussed with: CRNA  Anesthesia Plan Comments: (See PAT note written 12/10/2021 by Myra Gianotti, PA-C.  Lab Results      Component                Value               Date                      WBC                      11.4 (H)            12/09/2021                HGB                      9.7 (L)             12/09/2021  HCT                      33.6 (L)            12/09/2021                MCV                      97.4                12/09/2021                PLT                      224                 12/09/2021           Lab Results      Component                Value               Date                      NA                       137                 12/09/2021                K                        3.7                 12/09/2021                CO2                      42 (H)              12/09/2021                GLUCOSE                  107 (H)             12/09/2021                BUN                      23                  12/09/2021                CREATININE               0.87                12/09/2021                CALCIUM                  10.3                12/09/2021                GFRNONAA                 >60  12/09/2021           ECHO 2022: 1. Left ventricular ejection fraction, by estimation, is 65 to 70%. The  left ventricle has normal function. The left  ventricle has no regional  wall motion abnormalities. Left ventricular diastolic parameters are  consistent with Grade I diastolic  dysfunction (impaired relaxation).  2. Right ventricular systolic function is normal. The right ventricular  size is normal. Tricuspid regurgitation signal is inadequate for assessing  PA pressure.  3. The mitral valve is normal in structure. Trivial mitral valve  regurgitation. No evidence of mitral stenosis.  4. The aortic valve is tricuspid. Aortic valve regurgitation is not  visualized. No aortic stenosis is present.)      Anesthesia Quick Evaluation

## 2021-12-10 NOTE — Progress Notes (Signed)
Anesthesia Chart Review: SAME DAY WORK-UP  Case: [id number] Date/Time: 12/13/21 0715   Procedures:      OPEN REDUCTION INTERNAL FIXATION (ORIF) TIBIAL PLATEAU (Left)     BONE BIOPSY (Left)   Anesthesia type: General   Pre-op diagnosis: Left pathologic tibial plateau fracture   Location: [facility] OR ROOM 03 / [place] [facility]   Surgeons: Shona Needles, MD       DISCUSSION: Patient is a 76 year old female scheduled for the above procedure. She has been treated with intermittent SBRT for lung cancer since ~ 06/2019. Recent CT left knee concerning for pathologic tibial plateau fracture. She is on chronic prednisone and home O2 at 3L/. She uses CPAP at night and as needed during the day. She saw oncologist Dr. Julien Nordmann on 12/09/21.   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   - [place] admission 08/28/21-08/31/21 for sepsis secondary to E. coli UTI and COPD exacerbation. Polypharmacy and multiple sedating medications thought to be contributing. She required BiPAP initially and IV Solu-Medrol. Imaging showed large RML nodules unchanged since 07/2021 but concerning for progression of lung cancer. PET scan scheduled and out-patient oncology and pulmonology follow-up.  -  [place] admission 07/23/21-08/02/21 for acute on chronic respiratory failure and septic shock.  Her daughter found her unresponsive.  She was intubated by EMS due to GCS of 3.  CT of the head was negative but venous blood gas found the patient to have PCO2 of 95.6.  Chest x-ray did not show any acute pathology and glucose was normal.  She was noted to be volume overloaded.  She was diuresed.  She was treated empirically with vancomycin and cefepime but antibiotics discontinued as blood cultures and chest x-ray were negative. She was extubated 07/26/2021 and ultimately weaned to her baseline home O2 of 3 L. Discharged home with HHPT and low dose Lasix.  Last evaluation by oncologist Dr. Julien Nordmann was on 12/09/21. He noted likely pathologic tibial plateau fracture on recent CT, as well as 11/30/21 chest CT findings of slightly small RML lesion consistent with response to therapy, new focal airspace disease or ill-defined mass in the LUL favored to reflect pneumonia or postradiation changes, stable lingular scarring, and interval minimal enlargement of her right paratracheal lymph node still within normal limits of size and no other adenopathy. Encounter is still open, but appears a bone scan was ordered.   She is a same-day work-up, so anesthesia team to evaluate on the day of surgery.  She is for preoperative COVID-19 testing on arrival.  VS:  BP Readings from Last 3 Encounters:  12/09/21 105/85  08/31/21 (!) 163/63  08/02/21 (!) 130/54   Pulse Readings from Last 3 Encounters:  12/09/21 63  08/31/21 68  08/02/21 70     PROVIDERS: Harlan Stains, MD is PCP  - Kyung Rudd, MD is RAD-ONC. Last visit 11/08/21 with Shona Simpson, PA-C. On  12/06/21, referred to Dr. Julien Nordmann following recent imaging results. Considering palliative radiotherapy after surgery.  Curt Bears, MD is HEM-ONC - Laverda Sorenson, NP is Palliative Care provider. Last visit 12/08/19.  Had pending follow-up with oncology.  Was  awaiting orthopedic surgery for leg fracture.  Continue continuous O2 at 3 L.  Continue diuretic therapy for CHF, monitor weight.  Patient interested in hospice service in the future. Gardiner Rhyme, MD is pulmonologist in [facility].   LABS: Most recent lab results include: Lab Results  Component Value Date   WBC 11.4 (H) 12/09/2021   HGB 9.7 (L) 12/09/2021   HCT 33.6 (L) 12/09/2021   PLT 224 12/09/2021   GLUCOSE 107 (H) 12/09/2021   TRIG 137 07/25/2021   ALT 12 12/09/2021   AST 12 (L) 12/09/2021   NA 137 12/09/2021   K 3.7 12/09/2021   CL 91 (L) 12/09/2021   CREATININE 0.87 12/09/2021   BUN 23 12/09/2021   CO2 42 (H) 12/09/2021   TSH 0.993 08/28/2021   INR 0.9 07/23/2021   HGBA1C 5.0 07/24/2021   Last PFTs seen are prior to radiation therapy for lung cancer.   IMAGES: CT Chest 12/01/21: Comparison: PET-CT 09/02/21 and CT 07/24/21 IMPRESSION: 1. The previously demonstrated hypermetabolic right middle lobe lesion appears slightly smaller, consistent with response to therapy. 2. New focal airspace disease or ill-defined mass in the left upper lobe, favored to reflect pneumonia or post radiation change. Correlate clinically. Recommend short-term radiographic follow-up to exclude developing mass lesion. 3. More inferiorly, there is stable scarring in the lingula. 4. Interval minimal enlargement of a right paratracheal lymph node, still within normal limits for size. No other adenopathy. 5. Coronary and Aortic Atherosclerosis (ICD10-I70.0). Aortic Atherosclerosis (ICD10-I70.0) and Emphysema (ICD10-J43.9).   CT Left Knee 11/25/21: IMPRESSION: 1. Acute comminuted lateral tibial plateau fracture with 6.5 mm of depression of the articular surface (Schatzker III). This likely reflects a pathologic fracture. 2. Numerous abnormal bone lesions involving the distal femur and proximal tibia consistent with metastatic disease.  PET Scan 09/02/21: IMPRESSION: - 2.4 cm  hypermetabolic pulmonary nodule in the posterior right middle lobe, consistent with primary bronchogenic carcinoma. - 6 mm pretracheal mediastinal lymph node shows mild hypermetabolism. Metastatic disease cannot be excluded. - No other sites of metastatic disease identified. - 6 mm nodule in the posterior left parotid gland with mild hypermetabolism, consistent with primary salivary gland tumor.   EKG: 08/28/21: NSR   CV: Echo 07/25/21: IMPRESSIONS   1. Left ventricular ejection fraction, by estimation, is 65 to 70%. The  left ventricle has normal function. The left ventricle has no regional  wall motion abnormalities. Left ventricular diastolic parameters are  consistent with Grade I diastolic  dysfunction (impaired relaxation).   2. Right ventricular systolic function is normal. The right ventricular  size is normal. Tricuspid regurgitation signal is inadequate for assessing  PA pressure.   3. The mitral valve is normal in structure. Trivial mitral valve  regurgitation. No evidence of mitral stenosis.   4. The aortic valve is tricuspid. Aortic valve regurgitation is not  visualized. No aortic stenosis is present.    US Carotid 01/15/16: IMPRESSION: - Left greater the right carotid atherosclerosis. - Degree of narrowing less than 50% bilaterally. - Patent antegrade vertebral flow bilaterally.   Past Medical History:  Diagnosis Date   Anemia    blood transfusion November 2022   Anxiety    Asthma    Basal cell carcinoma    lung cancer.  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 Uses 2 L    Foot swelling    left- has shown to Drs.   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   Hypercalcemia    Hypertension    Hypothyroidism    Mixed hyperlipidemia    Pneumonia     Pulmonary nodule    Sleep apnea    pt uses a cpap nightly   Thyroid neoplasm    Urinary incontinence    Wears dentures    Wears glasses     Past Surgical History:  Procedure Laterality Date   BIOPSY  02/17/2021   Procedure: BIOPSY;  Surgeon: Otis Brace, MD;  Location: [place];  Service: Gastroenterology;;   COLONOSCOPY     DENTAL SURGERY     ELBOW ARTHROPLASTY Right 2020   ESOPHAGOGASTRODUODENOSCOPY (EGD) WITH PROPOFOL N/A 02/17/2021   Procedure: ESOPHAGOGASTRODUODENOSCOPY (EGD) WITH PROPOFOL;  Surgeon: Otis Brace, MD;  Location: [place] [facility];  Service: Gastroenterology;  Laterality: N/A;   EYE SURGERY Right    cataract surgery   FOOT SURGERY  30 YRS AGO   BILATERAL   HARDWARE REMOVAL Right 10/02/2020   Procedure: HARDWARE REMOVAL RIGHT ELBOW;  Surgeon: Shona Needles, MD;  Location: [place];  Service: Orthopedics;  Laterality: Right;   I & D EXTREMITY Right 01/13/2021   Procedure: IRRIGATION AND DEBRIDEMENT RIGHT ELBOW;  Surgeon: Shona Needles, MD;  Location: [place];  Service: Orthopedics;  Laterality: Right;   ORIF ELBOW FRACTURE Right 12/06/2017   Procedure: OPEN REDUCTION INTERNAL FIXATION (ORIF) ELBOW/OLECRANON FRACTURE;  Surgeon: Shona Needles, MD;  Location: [facility];  Service: Orthopedics;  Laterality: Right;   SKIN CANCER EXCISION     THYROIDECTOMY N/A 07/31/2014   Procedure: TOTAL THYROIDECTOMY;  Surgeon: Armandina Gemma, MD;  Location: [facility];  Service: General;  Laterality: N/A;   VAGINAL HYSTERECTOMY     VIDEO BRONCHOSCOPY WITH ENDOBRONCHIAL NAVIGATION Bilateral 06/25/2019   Procedure: VIDEO BRONCHOSCOPY WITH ENDOBRONCHIAL NAVIGATION;  Surgeon: Lajuana Matte, MD;  Location: [facility];  Service: Thoracic;  Laterality: Bilateral;    MEDICATIONS: No current facility-administered medications for this encounter.    acetaminophen (TYLENOL) 325 MG tablet   ALPRAZolam (XANAX) 0.5 MG tablet   atorvastatin (LIPITOR) 20 MG tablet   buPROPion (WELLBUTRIN XL)  300 MG 24 hr tablet   busPIRone (BUSPAR) 5 MG tablet   Docusate Sodium (COLACE PO)   donepezil (ARICEPT) 10 MG tablet   ferrous sulfate 325 (65 FE) MG tablet   Fluticasone-Umeclidin-Vilant 100-62.5-25 MCG/INH AEPB   furosemide (LASIX) 20 MG tablet   levothyroxine (SYNTHROID) 137 MCG tablet   losartan (COZAAR) 50 MG tablet   metoprolol succinate (TOPROL-XL) 50 MG 24 hr tablet   mirtazapine (REMERON) 30 MG tablet   ondansetron (ZOFRAN) 4 MG tablet   oxyCODONE (OXY IR/ROXICODONE) 5 MG immediate release tablet   pantoprazole (PROTONIX) 40 MG tablet   polyethylene glycol (MIRALAX / GLYCOLAX) 17 g packet   predniSONE (DELTASONE) 20 MG tablet   PROCTO-MED HC 2.5 % rectal cream   traMADol (ULTRAM) 50 MG tablet   VENTOLIN HFA 108 (90 Base) MCG/ACT inhaler   vitamin C (ASCORBIC ACID) 500 MG tablet  Myra Gianotti, PA-C Surgical Short Stay/Anesthesiology [facility] Phone [phone] [facility] Phone [phone] 12/10/2021 2:38 PM

## 2021-12-12 NOTE — H&P (Signed)
Orthopaedic Trauma Service (OTS) H&P  Patient ID: Holly [name] MRN: [id number] DOB/AGE: Apr 18, 1946 76 y.o.  Reason for surgery: Left pathologic tibial plateau fracture  HPI: Holly [name] is an 76 y.o. female with multiple multiple medical conditions including right lung cancer, COPD, CHF presenting for surgery on left lower extremity.  Patient has been undergoing treatment for lung cancer since 06/2019.  Not currently on chemotherapy at this time, radiation therapy completed last month.  Due to complaints of increased left knee pain with no significant injury noted, CT scan was completed on 11/25/2021 which revealed tibial plateau fracture with multiple areas of lytic bone lesions.  Patient presents today for repair of tibial plateau and diagnostic bone biopsy. Patient currently cannot walk due to the pain that she is experiencing.   Past Medical History:  Diagnosis Date   Anemia    blood transfusion November 2022   Anxiety    Asthma    Basal cell carcinoma    lung cancer.  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 Uses 2 L    Foot swelling    left- has shown to Drs.   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   Hypercalcemia    Hypertension    Hypothyroidism    Mixed hyperlipidemia    Pneumonia    Pulmonary nodule    Sleep apnea    pt uses a cpap nightly   Thyroid neoplasm    Urinary incontinence    Wears dentures    Wears glasses     Past Surgical History:  Procedure Laterality Date   BIOPSY  02/17/2021   Procedure: BIOPSY;  Surgeon: Otis Brace, MD;  Location: [place] [facility];  Service: Gastroenterology;;   COLONOSCOPY     DENTAL SURGERY     ELBOW ARTHROPLASTY Right 2020   ESOPHAGOGASTRODUODENOSCOPY (EGD) WITH PROPOFOL N/A 02/17/2021   Procedure:  ESOPHAGOGASTRODUODENOSCOPY (EGD) WITH PROPOFOL;  Surgeon: Otis Brace, MD;  Location: [place] [facility];  Service: Gastroenterology;  Laterality: N/A;   EYE SURGERY Right    cataract surgery   FOOT SURGERY  30 YRS AGO   BILATERAL   HARDWARE REMOVAL Right 10/02/2020   Procedure: HARDWARE REMOVAL RIGHT ELBOW;  Surgeon: Shona Needles, MD;  Location: [place];  Service: Orthopedics;  Laterality: Right;   I & D EXTREMITY Right 01/13/2021   Procedure: IRRIGATION AND DEBRIDEMENT RIGHT ELBOW;  Surgeon: Shona Needles, MD;  Location: [place];  Service: Orthopedics;  Laterality: Right;   ORIF ELBOW FRACTURE Right 12/06/2017   Procedure: OPEN REDUCTION INTERNAL FIXATION (ORIF) ELBOW/OLECRANON FRACTURE;  Surgeon: Shona Needles, MD;  Location: [place];  Service: Orthopedics;  Laterality: Right;   SKIN CANCER EXCISION     THYROIDECTOMY N/A 07/31/2014   Procedure: TOTAL THYROIDECTOMY;  Surgeon: Armandina Gemma, MD;  [facility];  Service: General;  Laterality: N/A;   VAGINAL HYSTERECTOMY     VIDEO BRONCHOSCOPY WITH ENDOBRONCHIAL NAVIGATION Bilateral 06/25/2019   Procedure: VIDEO BRONCHOSCOPY WITH ENDOBRONCHIAL NAVIGATION;  Surgeon: Lajuana Matte, MD;  [facility];  Service: Thoracic;  Laterality: Bilateral;    Family History  Problem Relation Age of Onset   Coronary artery disease Father  Hypertension Father    Asthma Father    Allergies Father    Hypertension Mother    Lung cancer Mother     Social History:  reports that she quit smoking about 6 years ago. Her smoking use included cigarettes. She has a 30.00 pack-year smoking history. She has never used smokeless tobacco. She reports current alcohol use. She reports that she does not use drugs.  Allergies:  Allergies  Allergen Reactions   Amlodipine Besylate Swelling    Edema   Codeine Nausea And Vomiting   Oxycodone-Acetaminophen Nausea And Vomiting    Medications: I have reviewed the patient's current medications. Prior to  Admission:  No medications prior to admission.    ROS: Constitutional: No fever or chills Vision: No changes in vision ENT: No difficulty swallowing CV: No chest pain Pulm: No SOB or wheezing GI: No nausea or vomiting GU: No urgency or inability to hold urine Skin: No poor wound healing Neurologic: No numbness or tingling Psychiatric: No depression or anxiety Heme: No bruising Allergic: No reaction to medications or food   Exam: There were no vitals taken for this visit. General: No acute distress Orientation: Alert and oriented x3 Mood and Affect: Mood and affect appropriate, pleasant and cooperative Gait: Unable to ambulate secondary to pain Coordination and balance: Within normal limits   Left lower extremity: Moderate effusion, pain with attempted ROM of knee. Alignment appropriate. Active motor and sensory function intact. Warm and well perfused foot.  Right lower extremity: Skin without lesions. No tenderness to palpation. Full painless ROM, full strength in each muscle groups without evidence of instability.   Medical Decision Making: Data: Imaging: CT scan left knee shows lateral tibial plateau fracture with 6.5 mm of depression at the articular surface.  Numerous abnormal lytic bone lesions noted in the distal femur as well as proximal tibia, consistent with metastatic disease.  Labs:  Results for orders placed or performed in visit on 12/09/21 (from the past 168 hour(s))  CMP ([place] only)   Collection Time: 12/09/21 10:50 AM  Result Value Ref Range   Sodium 137 135 - 145 mmol/L   Potassium 3.7 3.5 - 5.1 mmol/L   Chloride 91 (L) 98 - 111 mmol/L   CO2 42 (H) 22 - 32 mmol/L   Glucose, Bld 107 (H) 70 - 99 mg/dL   BUN 23 8 - 23 mg/dL   Creatinine 0.87 0.44 - 1.00 mg/dL   Calcium 10.3 8.9 - 10.3 mg/dL   Total Protein 6.8 6.5 - 8.1 g/dL   Albumin 3.3 (L) 3.5 - 5.0 g/dL   AST 12 (L) 15 - 41 U/L   ALT 12 0 - 44 U/L   Alkaline Phosphatase 88 38 - 126 U/L    Total Bilirubin 0.4 0.3 - 1.2 mg/dL   GFR, Estimated >60 >60 mL/min   Anion gap 4 (L) 5 - 15  CBC with Differential (Cancer Center Only)   Collection Time: 12/09/21 10:50 AM  Result Value Ref Range   WBC Count 11.4 (H) 4.0 - 10.5 K/uL   RBC 3.45 (L) 3.87 - 5.11 MIL/uL   Hemoglobin 9.7 (L) 12.0 - 15.0 g/dL   HCT 33.6 (L) 36.0 - 46.0 %   MCV 97.4 80.0 - 100.0 fL   MCH 28.1 26.0 - 34.0 pg   MCHC 28.9 (L) 30.0 - 36.0 g/dL   RDW 18.1 (H) 11.5 - 15.5 %   Platelet Count 224 150 - 400 K/uL   nRBC 0.0 0.0 -  0.2 %   Neutrophils Relative % 60 %   Neutro Abs 6.9 1.7 - 7.7 K/uL   Lymphocytes Relative 29 %   Lymphs Abs 3.4 0.7 - 4.0 K/uL   Monocytes Relative 8 %   Monocytes Absolute 0.9 0.1 - 1.0 K/uL   Eosinophils Relative 2 %   Eosinophils Absolute 0.2 0.0 - 0.5 K/uL   Basophils Relative 0 %   Basophils Absolute 0.0 0.0 - 0.1 K/uL   Immature Granulocytes 1 %   Abs Immature Granulocytes 0.09 (H) 0.00 - 0.07 K/uL     Assessment/Plan: 76 year old female presenting with left tibial plateau fracture, likely pathologic  Patient with significant injury to left lower extremity which is causing notable amount of pain.  Recommend proceeding with open reduction internal fixation of the left tibial plateau.  We will plan to perform bone biopsy intraoperatively.  Risk and benefits of procedure discussed with the patient.  She agrees to proceed with surgery.  Shona Needles, MD [facility] [phone] (office) [email or web address]

## 2021-12-13 ENCOUNTER — Encounter (HOSPITAL_COMMUNITY): Payer: Self-pay | Admitting: Student

## 2021-12-13 ENCOUNTER — Inpatient Hospital Stay (HOSPITAL_COMMUNITY): Payer: Medicare Other | Admitting: Vascular Surgery

## 2021-12-13 ENCOUNTER — Encounter (HOSPITAL_COMMUNITY)
Admission: RE | Disposition: A | Discharge status: Home Health | Payer: Self-pay | Source: Home / Self Care | Attending: Student

## 2021-12-13 ENCOUNTER — Inpatient Hospital Stay (HOSPITAL_COMMUNITY)
Admission: RE | Admit: 2021-12-13 | Discharge: 2021-12-14 | DRG: 478 | Disposition: A | Discharge status: Home Health | Payer: Medicare Other | Attending: Student | Admitting: Student

## 2021-12-13 ENCOUNTER — Other Ambulatory Visit: Payer: Self-pay

## 2021-12-13 ENCOUNTER — Inpatient Hospital Stay (HOSPITAL_COMMUNITY): Payer: Medicare Other

## 2021-12-13 DIAGNOSIS — Z888 Allergy status to other drugs, medicaments and biological substances status: Secondary | ICD-10-CM | POA: Diagnosis not present

## 2021-12-13 DIAGNOSIS — Z8711 Personal history of peptic ulcer disease: Secondary | ICD-10-CM | POA: Diagnosis not present

## 2021-12-13 DIAGNOSIS — Z9989 Dependence on other enabling machines and devices: Secondary | ICD-10-CM | POA: Diagnosis not present

## 2021-12-13 DIAGNOSIS — G4733 Obstructive sleep apnea (adult) (pediatric): Secondary | ICD-10-CM | POA: Diagnosis not present

## 2021-12-13 DIAGNOSIS — M84562A Pathological fracture in neoplastic disease, left tibia, initial encounter for fracture: Principal | ICD-10-CM | POA: Diagnosis present

## 2021-12-13 DIAGNOSIS — S82142A Displaced bicondylar fracture of left tibia, initial encounter for closed fracture: Secondary | ICD-10-CM | POA: Diagnosis not present

## 2021-12-13 DIAGNOSIS — Z7952 Long term (current) use of systemic steroids: Secondary | ICD-10-CM

## 2021-12-13 DIAGNOSIS — Z801 Family history of malignant neoplasm of trachea, bronchus and lung: Secondary | ICD-10-CM | POA: Diagnosis not present

## 2021-12-13 DIAGNOSIS — Z79890 Hormone replacement therapy (postmenopausal): Secondary | ICD-10-CM

## 2021-12-13 DIAGNOSIS — E039 Hypothyroidism, unspecified: Secondary | ICD-10-CM | POA: Diagnosis not present

## 2021-12-13 DIAGNOSIS — Z85118 Personal history of other malignant neoplasm of bronchus and lung: Secondary | ICD-10-CM | POA: Diagnosis not present

## 2021-12-13 DIAGNOSIS — Z20822 Contact with and (suspected) exposure to covid-19: Secondary | ICD-10-CM | POA: Diagnosis present

## 2021-12-13 DIAGNOSIS — Z9981 Dependence on supplemental oxygen: Secondary | ICD-10-CM

## 2021-12-13 DIAGNOSIS — I5042 Chronic combined systolic (congestive) and diastolic (congestive) heart failure: Secondary | ICD-10-CM | POA: Diagnosis present

## 2021-12-13 DIAGNOSIS — E782 Mixed hyperlipidemia: Secondary | ICD-10-CM | POA: Diagnosis present

## 2021-12-13 DIAGNOSIS — A4902 Methicillin resistant Staphylococcus aureus infection, unspecified site: Secondary | ICD-10-CM | POA: Diagnosis not present

## 2021-12-13 DIAGNOSIS — M84462A Pathological fracture, left tibia, initial encounter for fracture: Secondary | ICD-10-CM

## 2021-12-13 DIAGNOSIS — E890 Postprocedural hypothyroidism: Secondary | ICD-10-CM | POA: Diagnosis present

## 2021-12-13 DIAGNOSIS — N1832 Chronic kidney disease, stage 3b: Secondary | ICD-10-CM | POA: Diagnosis present

## 2021-12-13 DIAGNOSIS — J439 Emphysema, unspecified: Secondary | ICD-10-CM | POA: Diagnosis present

## 2021-12-13 DIAGNOSIS — Z885 Allergy status to narcotic agent status: Secondary | ICD-10-CM | POA: Diagnosis not present

## 2021-12-13 DIAGNOSIS — F32A Depression, unspecified: Secondary | ICD-10-CM | POA: Diagnosis present

## 2021-12-13 DIAGNOSIS — Z85820 Personal history of malignant melanoma of skin: Secondary | ICD-10-CM | POA: Diagnosis not present

## 2021-12-13 DIAGNOSIS — Z8249 Family history of ischemic heart disease and other diseases of the circulatory system: Secondary | ICD-10-CM | POA: Diagnosis not present

## 2021-12-13 DIAGNOSIS — C3411 Malignant neoplasm of upper lobe, right bronchus or lung: Secondary | ICD-10-CM | POA: Diagnosis present

## 2021-12-13 DIAGNOSIS — T148XXA Other injury of unspecified body region, initial encounter: Secondary | ICD-10-CM

## 2021-12-13 DIAGNOSIS — I130 Hypertensive heart and chronic kidney disease with heart failure and stage 1 through stage 4 chronic kidney disease, or unspecified chronic kidney disease: Secondary | ICD-10-CM | POA: Diagnosis present

## 2021-12-13 DIAGNOSIS — Z825 Family history of asthma and other chronic lower respiratory diseases: Secondary | ICD-10-CM

## 2021-12-13 DIAGNOSIS — I10 Essential (primary) hypertension: Secondary | ICD-10-CM

## 2021-12-13 DIAGNOSIS — Z87891 Personal history of nicotine dependence: Secondary | ICD-10-CM | POA: Diagnosis not present

## 2021-12-13 DIAGNOSIS — Z419 Encounter for procedure for purposes other than remedying health state, unspecified: Secondary | ICD-10-CM

## 2021-12-13 DIAGNOSIS — Z79899 Other long term (current) drug therapy: Secondary | ICD-10-CM

## 2021-12-13 DIAGNOSIS — K219 Gastro-esophageal reflux disease without esophagitis: Secondary | ICD-10-CM | POA: Diagnosis present

## 2021-12-13 DIAGNOSIS — D638 Anemia in other chronic diseases classified elsewhere: Secondary | ICD-10-CM | POA: Diagnosis not present

## 2021-12-13 DIAGNOSIS — J9610 Chronic respiratory failure, unspecified whether with hypoxia or hypercapnia: Secondary | ICD-10-CM | POA: Diagnosis present

## 2021-12-13 HISTORY — DX: Anemia, unspecified: D64.9

## 2021-12-13 HISTORY — PX: ORIF TIBIA PLATEAU: SHX2132

## 2021-12-13 HISTORY — PX: BONE BIOPSY: SHX375

## 2021-12-13 HISTORY — DX: Depression, unspecified: F32.A

## 2021-12-13 LAB — SARS CORONAVIRUS 2 BY RT PCR (HOSPITAL ORDER, PERFORMED IN ~~LOC~~ HOSPITAL LAB): SARS Coronavirus 2: NEGATIVE

## 2021-12-13 LAB — VITAMIN D 25 HYDROXY (VIT D DEFICIENCY, FRACTURES): Vit D, 25-Hydroxy: 12.46 ng/mL — ABNORMAL LOW (ref 30–100)

## 2021-12-13 IMAGING — RF DG KNEE 1-2V*L*
1 series · 5 of 5 positions shown · non-contrast
Comparison: CT left knee [DATE]

CLINICAL DATA: Left tibial plateau fluoroscopy.  ORIF.

EXAM:
LEFT KNEE - 1-2 VIEW

[Series 1: run · 5 of 5 slices shown]
[im 1/5]
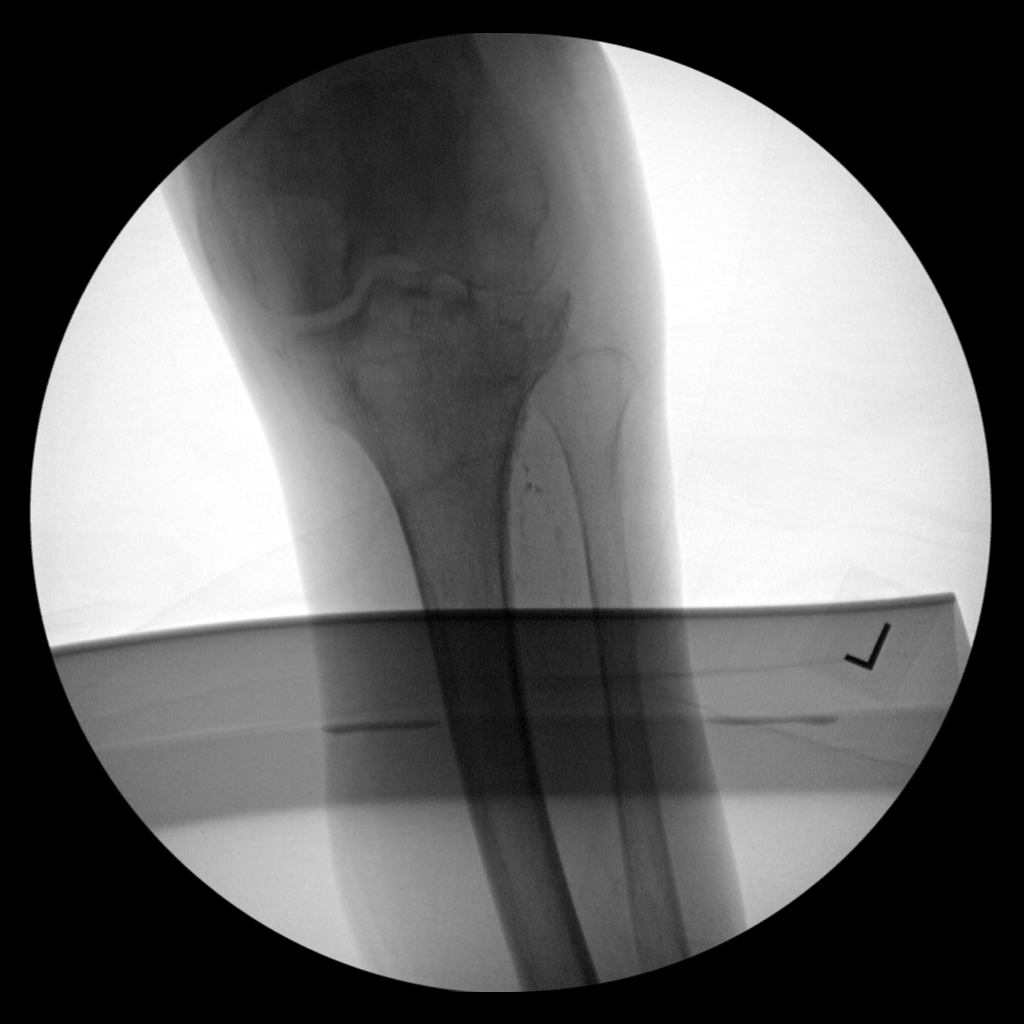
[im 2/5]
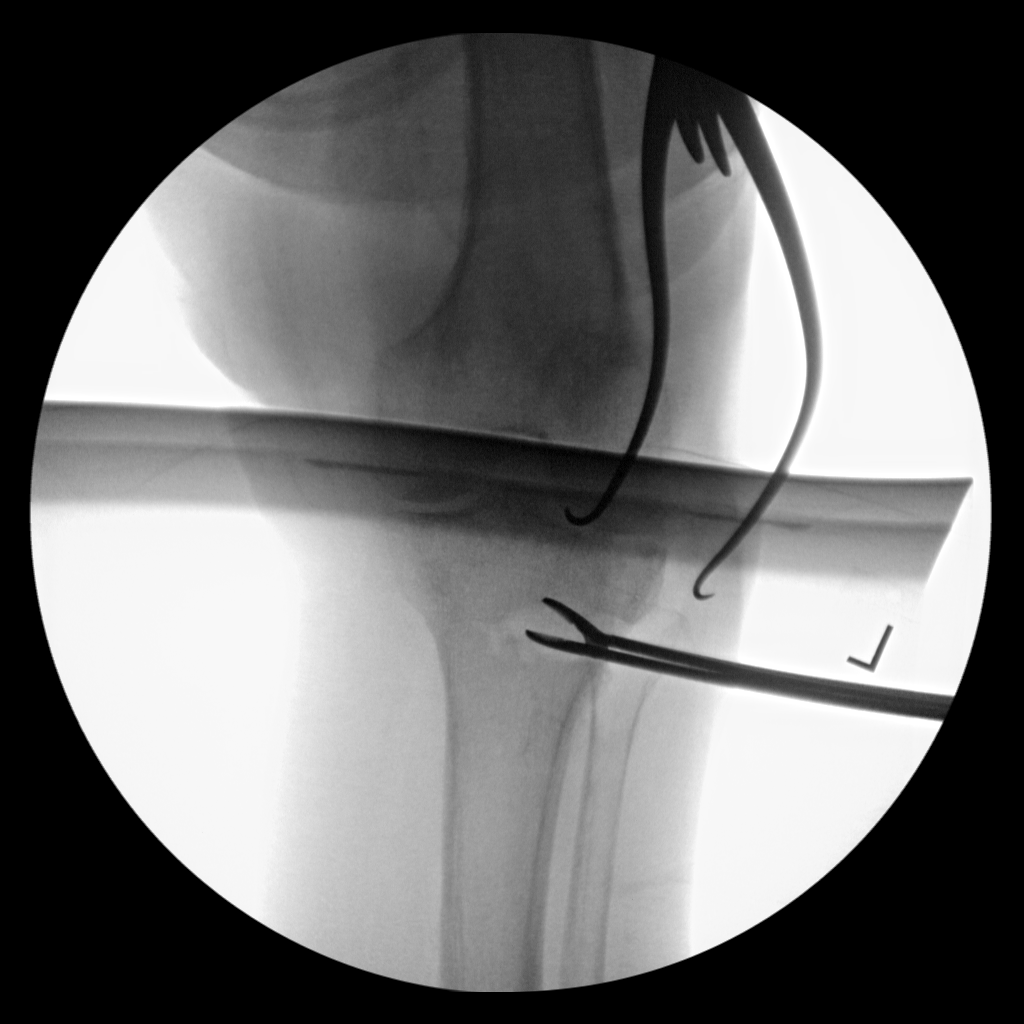
[im 3/5]
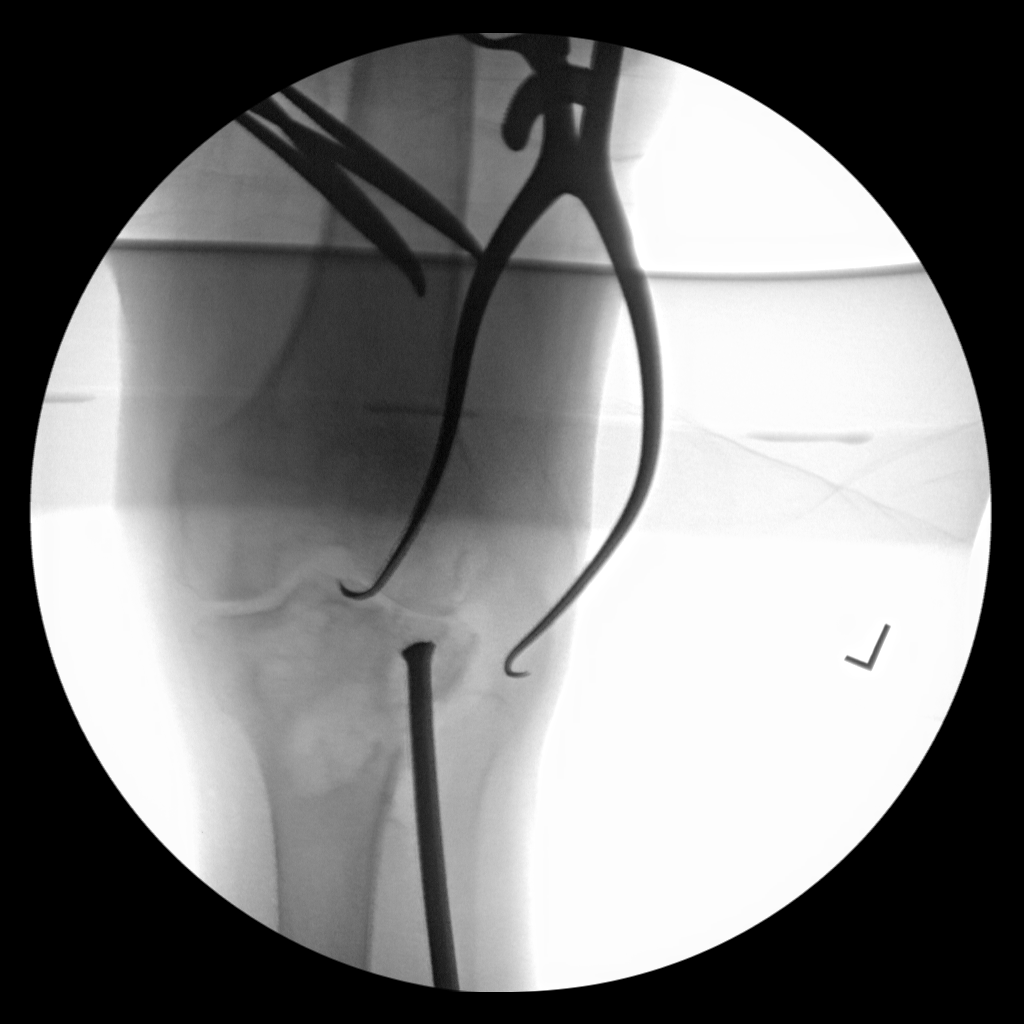
[im 4/5]
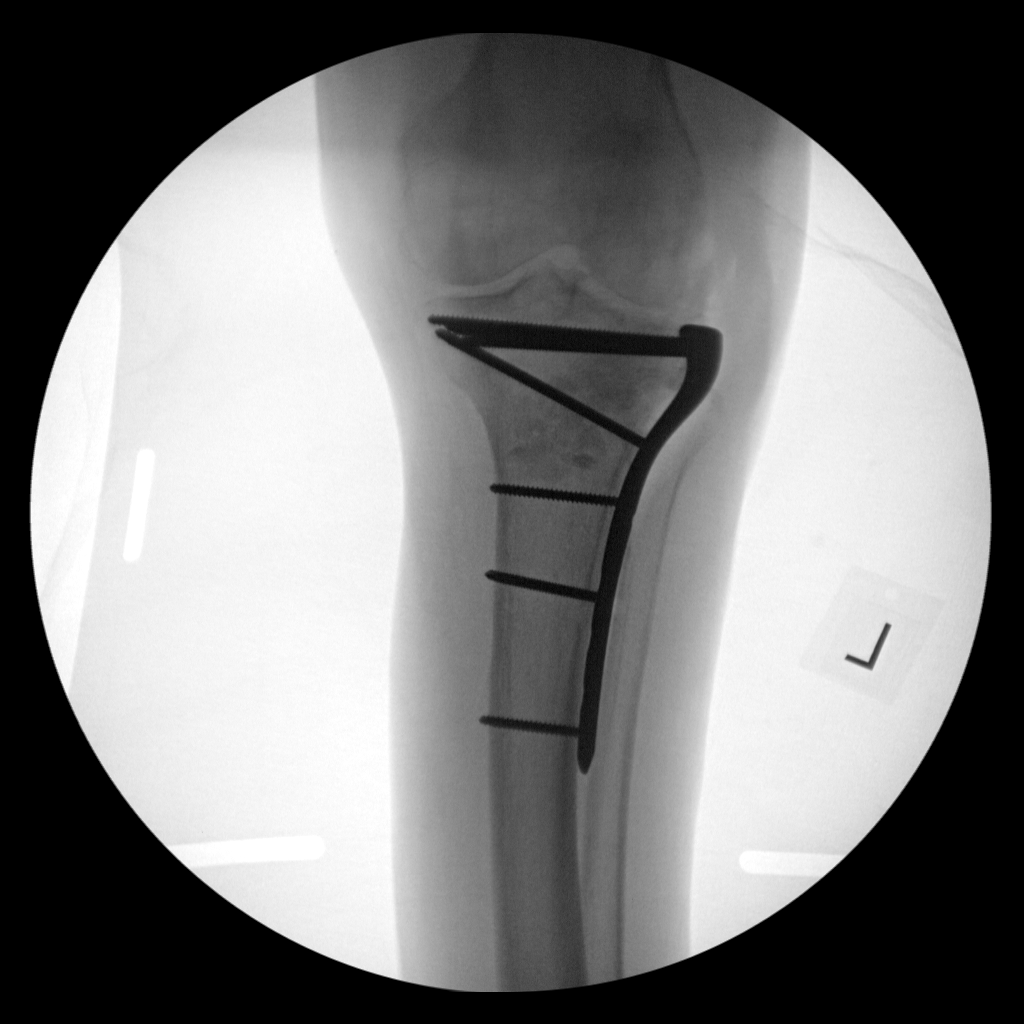
[im 5/5]
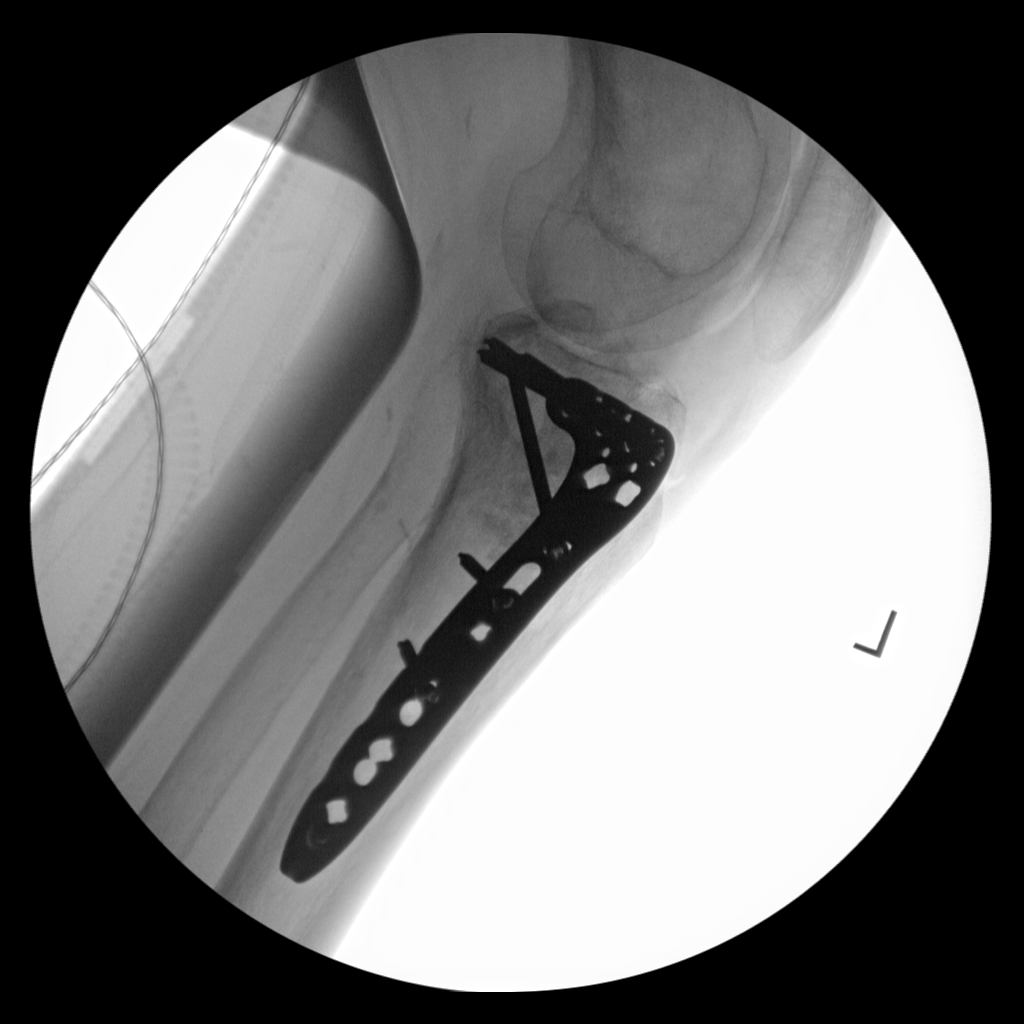

[5 of 5 positions shown; findings below may reference images not displayed]

FINDINGS: Images were performed intraoperatively without the presence of a
radiologist.

Total images: 5

Total fluoro time: 1 minute 40 seconds

Dose: 5.0 mGy

The patient appears to be undergoing lateral plate and screw
fixation of the previously seen depressed lateral tibial plateau
fracture.

Please see intraoperative findings for further detail.
IMPRESSION: Fluoroscopy for ORIF of depressed lateral tibial plateau fracture.

## 2021-12-13 IMAGING — DX DG KNEE 1-2V PORT*L*
2 series · 2 of 2 positions shown · non-contrast
Comparison: CT done on [DATE]

CLINICAL DATA: Lateral tibial plateau flexure

EXAM:
PORTABLE LEFT KNEE - 1-2 VIEW

[knee ap]
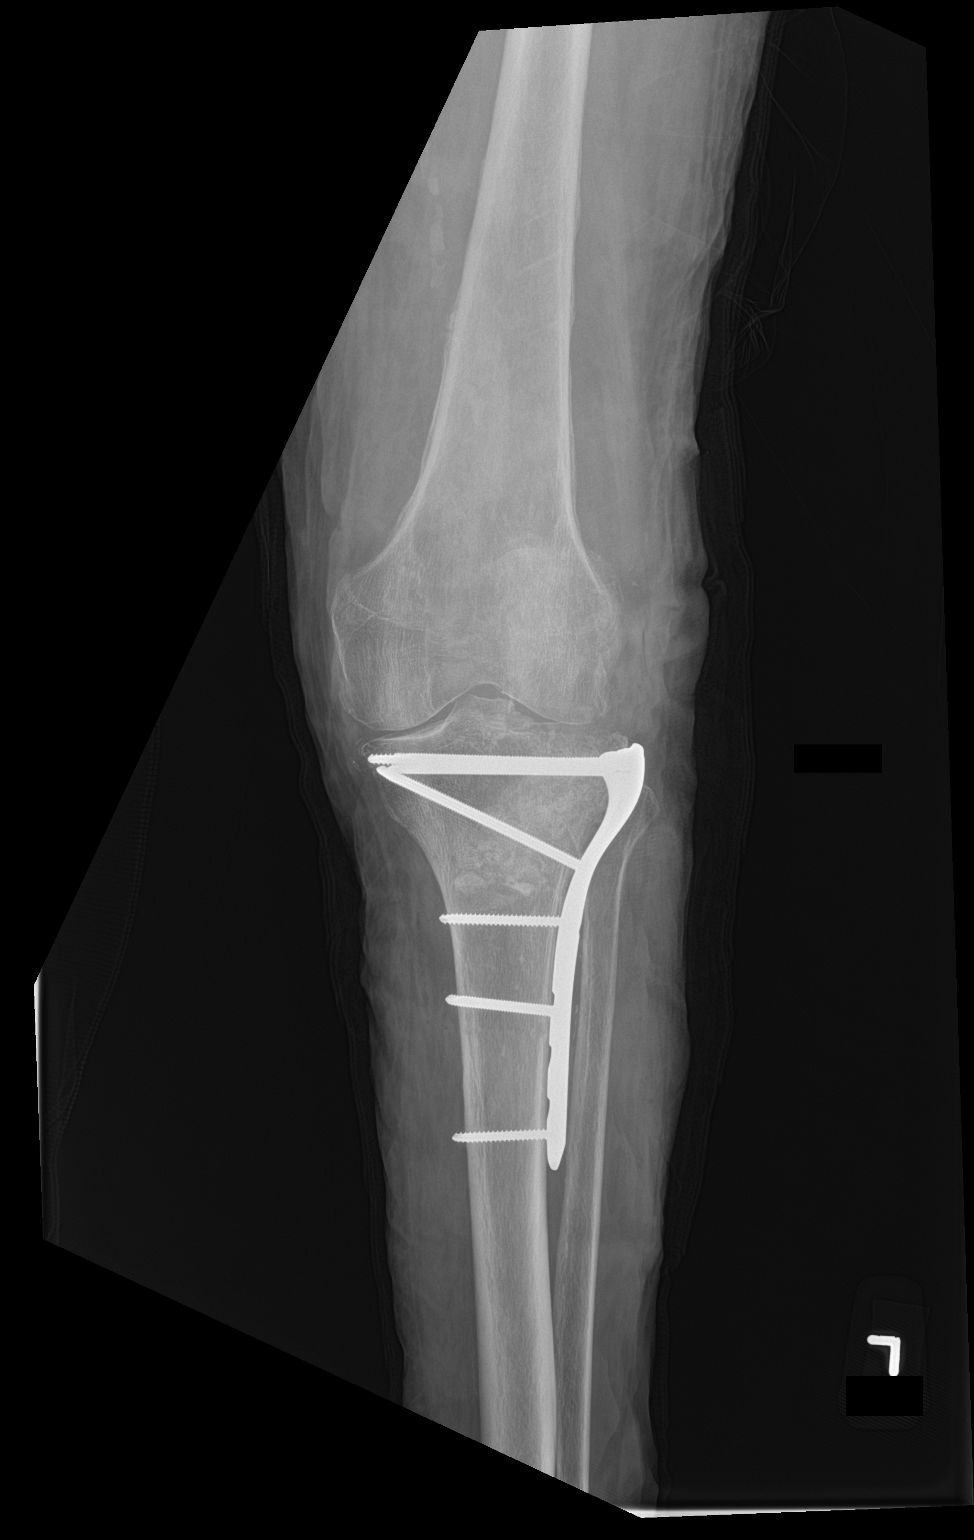

[knee lat]
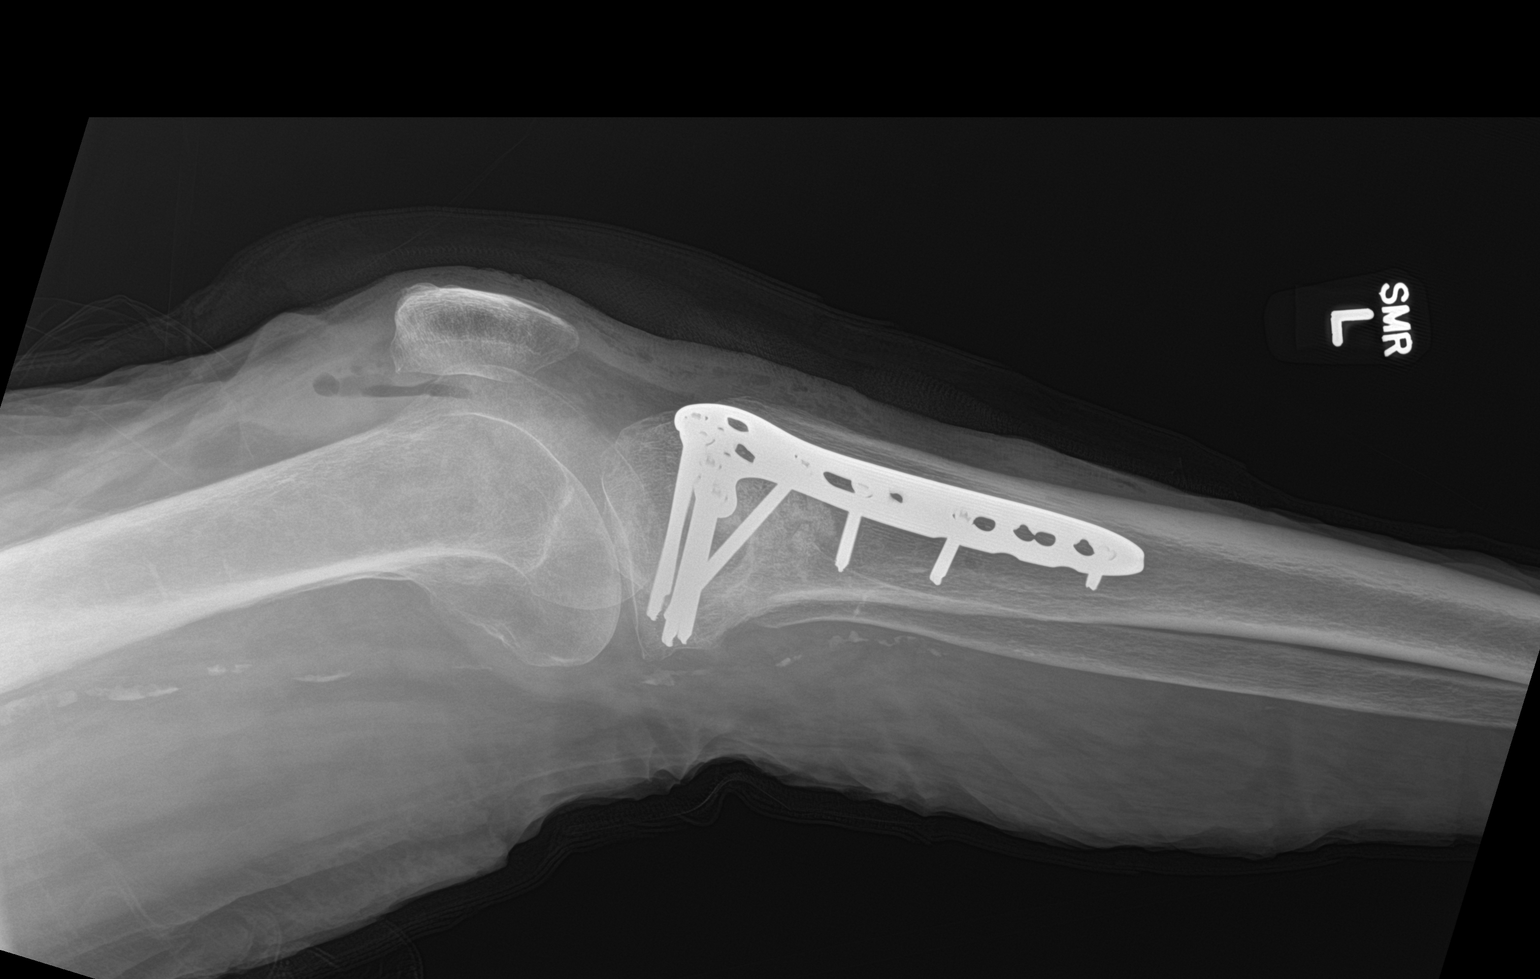

[2 of 2 positions shown; findings below may reference images not displayed]

FINDINGS: There is interval internal fixation of lateral tibial plateau
fracture. There is improvement in alignment of the articular surface
of lateral plateau. There are pockets of air in the suprapatellar
bursa related to recent surgery. Scattered arterial calcifications
are seen.
IMPRESSION: Status post internal fixation of lateral tibial plateau fracture.

## 2021-12-13 SURGERY — OPEN REDUCTION INTERNAL FIXATION (ORIF) TIBIAL PLATEAU
Anesthesia: General | Site: Knee | Laterality: Left

## 2021-12-13 MED ORDER — PREDNISONE 20 MG PO TABS
20.0000 mg | ORAL_TABLET | Freq: Every day | ORAL | Status: DC
  Administered 2021-12-14: 20 mg via ORAL
  Filled 2021-12-13 (×2): qty 1

## 2021-12-13 MED ORDER — LACTATED RINGERS IV SOLN: INTRAVENOUS | Status: DC | PRN

## 2021-12-13 MED ORDER — CHLORHEXIDINE GLUCONATE 0.12 % MT SOLN
OROMUCOSAL | Status: AC
  Administered 2021-12-13: 15 mL
  Filled 2021-12-13: qty 15

## 2021-12-13 MED ORDER — FUROSEMIDE 40 MG PO TABS
40.0000 mg | ORAL_TABLET | Freq: Every day | ORAL | Status: DC
  Administered 2021-12-14: 40 mg via ORAL
  Filled 2021-12-13 (×2): qty 1

## 2021-12-13 MED ORDER — EPHEDRINE 5 MG/ML INJ
INTRAVENOUS | Status: AC
  Filled 2021-12-13: qty 5

## 2021-12-13 MED ORDER — CELECOXIB 200 MG PO CAPS
200.0000 mg | ORAL_CAPSULE | Freq: Once | ORAL | Status: AC
  Administered 2021-12-13: 200 mg via ORAL
  Filled 2021-12-13: qty 1

## 2021-12-13 MED ORDER — PROPOFOL 10 MG/ML IV BOLUS
INTRAVENOUS | Status: AC
  Filled 2021-12-13: qty 20

## 2021-12-13 MED ORDER — MORPHINE SULFATE (PF) 2 MG/ML IV SOLN
0.5000 mg | INTRAVENOUS | Status: DC | PRN
  Administered 2021-12-13: 1 mg via INTRAVENOUS
  Filled 2021-12-13: qty 1

## 2021-12-13 MED ORDER — SODIUM CHLORIDE 0.9 % IV SOLN: INTRAVENOUS | Status: DC

## 2021-12-13 MED ORDER — DIPHENHYDRAMINE HCL 50 MG/ML IJ SOLN
INTRAMUSCULAR | Status: AC
  Filled 2021-12-13: qty 1

## 2021-12-13 MED ORDER — ACETAMINOPHEN 500 MG PO TABS
1000.0000 mg | ORAL_TABLET | Freq: Four times a day (QID) | ORAL | Status: DC
  Administered 2021-12-13 – 2021-12-14 (×3): 1000 mg via ORAL
  Filled 2021-12-13 (×4): qty 2

## 2021-12-13 MED ORDER — BUSPIRONE HCL 5 MG PO TABS
5.0000 mg | ORAL_TABLET | Freq: Two times a day (BID) | ORAL | Status: DC
  Administered 2021-12-13 – 2021-12-14 (×2): 5 mg via ORAL
  Filled 2021-12-13 (×2): qty 1

## 2021-12-13 MED ORDER — ONDANSETRON HCL 4 MG PO TABS: 4.0000 mg | ORAL_TABLET | Freq: Four times a day (QID) | ORAL | Status: DC | PRN

## 2021-12-13 MED ORDER — FLUTICASONE-UMECLIDIN-VILANT 100-62.5-25 MCG/INH IN AEPB: 1.0000 | INHALATION_SPRAY | Freq: Every day | RESPIRATORY_TRACT | Status: DC

## 2021-12-13 MED ORDER — ALPRAZOLAM 0.25 MG PO TABS
0.5000 mg | ORAL_TABLET | Freq: Two times a day (BID) | ORAL | Status: DC
  Administered 2021-12-13 – 2021-12-14 (×2): 0.5 mg via ORAL
  Filled 2021-12-13 (×2): qty 2

## 2021-12-13 MED ORDER — FENTANYL CITRATE (PF) 100 MCG/2ML IJ SOLN
25.0000 ug | INTRAMUSCULAR | Status: DC | PRN
  Administered 2021-12-13 (×3): 50 ug via INTRAVENOUS

## 2021-12-13 MED ORDER — CELECOXIB 200 MG PO CAPS
200.0000 mg | ORAL_CAPSULE | Freq: Two times a day (BID) | ORAL | Status: DC
  Administered 2021-12-13: 200 mg via ORAL
  Filled 2021-12-13 (×4): qty 1

## 2021-12-13 MED ORDER — LEVOTHYROXINE SODIUM 25 MCG PO TABS
137.0000 ug | ORAL_TABLET | Freq: Every day | ORAL | Status: DC
  Administered 2021-12-14: 137 ug via ORAL
  Filled 2021-12-13: qty 1

## 2021-12-13 MED ORDER — DIPHENHYDRAMINE HCL 50 MG/ML IJ SOLN
12.5000 mg | Freq: Once | INTRAMUSCULAR | Status: AC
  Administered 2021-12-13: 12.5 mg via INTRAVENOUS

## 2021-12-13 MED ORDER — FLUTICASONE FUROATE-VILANTEROL 100-25 MCG/ACT IN AEPB
1.0000 | INHALATION_SPRAY | Freq: Every day | RESPIRATORY_TRACT | Status: DC
Start: 2021-12-13 — End: 2021-12-13
  Filled 2021-12-13: qty 28

## 2021-12-13 MED ORDER — SUGAMMADEX SODIUM 200 MG/2ML IV SOLN
INTRAVENOUS | Status: DC | PRN
  Administered 2021-12-13: 150 mg via INTRAVENOUS

## 2021-12-13 MED ORDER — LOSARTAN POTASSIUM 50 MG PO TABS
50.0000 mg | ORAL_TABLET | Freq: Every day | ORAL | Status: DC
  Administered 2021-12-13: 50 mg via ORAL
  Filled 2021-12-13: qty 1

## 2021-12-13 MED ORDER — UMECLIDINIUM BROMIDE 62.5 MCG/ACT IN AEPB
1.0000 | INHALATION_SPRAY | Freq: Every day | RESPIRATORY_TRACT | Status: DC
  Administered 2021-12-14: 1 via RESPIRATORY_TRACT
  Filled 2021-12-13: qty 7

## 2021-12-13 MED ORDER — PHENYLEPHRINE HCL-NACL 20-0.9 MG/250ML-% IV SOLN
INTRAVENOUS | Status: DC | PRN
  Administered 2021-12-13: 15 ug/min via INTRAVENOUS

## 2021-12-13 MED ORDER — METOCLOPRAMIDE HCL 5 MG PO TABS: 5.0000 mg | ORAL_TABLET | Freq: Three times a day (TID) | ORAL | Status: DC | PRN

## 2021-12-13 MED ORDER — CEFAZOLIN SODIUM 1 G IJ SOLR
INTRAMUSCULAR | Status: AC
Start: 2021-12-13 — End: ?
  Filled 2021-12-13: qty 20

## 2021-12-13 MED ORDER — ROCURONIUM BROMIDE 10 MG/ML (PF) SYRINGE
PREFILLED_SYRINGE | INTRAVENOUS | Status: DC | PRN
  Administered 2021-12-13: 10 mg via INTRAVENOUS
  Administered 2021-12-13: 40 mg via INTRAVENOUS

## 2021-12-13 MED ORDER — TRAMADOL HCL 50 MG PO TABS
50.0000 mg | ORAL_TABLET | Freq: Four times a day (QID) | ORAL | Status: DC | PRN
  Administered 2021-12-13: 100 mg via ORAL
  Administered 2021-12-13: 50 mg via ORAL
  Administered 2021-12-14: 100 mg via ORAL
  Filled 2021-12-13 (×3): qty 2

## 2021-12-13 MED ORDER — ROCURONIUM BROMIDE 10 MG/ML (PF) SYRINGE
PREFILLED_SYRINGE | INTRAVENOUS | Status: AC
  Filled 2021-12-13: qty 10

## 2021-12-13 MED ORDER — VANCOMYCIN HCL 1000 MG IV SOLR
INTRAVENOUS | Status: AC
  Filled 2021-12-13: qty 20

## 2021-12-13 MED ORDER — MIRTAZAPINE 15 MG PO TABS
30.0000 mg | ORAL_TABLET | Freq: Every day | ORAL | Status: DC
  Administered 2021-12-13: 30 mg via ORAL
  Filled 2021-12-13: qty 2

## 2021-12-13 MED ORDER — ONDANSETRON HCL 4 MG/2ML IJ SOLN
INTRAMUSCULAR | Status: AC
  Filled 2021-12-13: qty 2

## 2021-12-13 MED ORDER — VANCOMYCIN HCL 1000 MG IV SOLR
INTRAVENOUS | Status: DC | PRN
  Administered 2021-12-13: 1000 mg via TOPICAL

## 2021-12-13 MED ORDER — ATORVASTATIN CALCIUM 10 MG PO TABS
20.0000 mg | ORAL_TABLET | Freq: Every day | ORAL | Status: DC
  Administered 2021-12-13: 20 mg via ORAL
  Filled 2021-12-13: qty 2

## 2021-12-13 MED ORDER — BUPROPION HCL ER (XL) 150 MG PO TB24
300.0000 mg | ORAL_TABLET | Freq: Every morning | ORAL | Status: DC
Start: 2021-12-14 — End: 2021-12-14
  Administered 2021-12-14: 300 mg via ORAL

## 2021-12-13 MED ORDER — FENTANYL CITRATE (PF) 100 MCG/2ML IJ SOLN
INTRAMUSCULAR | Status: AC
  Filled 2021-12-13: qty 2

## 2021-12-13 MED ORDER — FLUTICASONE FUROATE-VILANTEROL 100-25 MCG/ACT IN AEPB
1.0000 | INHALATION_SPRAY | Freq: Every day | RESPIRATORY_TRACT | Status: DC
  Administered 2021-12-14: 1 via RESPIRATORY_TRACT
  Filled 2021-12-13: qty 28

## 2021-12-13 MED ORDER — ALBUTEROL SULFATE (2.5 MG/3ML) 0.083% IN NEBU: 2.5000 mg | INHALATION_SOLUTION | RESPIRATORY_TRACT | Status: DC | PRN

## 2021-12-13 MED ORDER — DEXAMETHASONE SODIUM PHOSPHATE 10 MG/ML IJ SOLN
INTRAMUSCULAR | Status: DC | PRN
  Administered 2021-12-13: 8 mg via INTRAVENOUS

## 2021-12-13 MED ORDER — 0.9 % SODIUM CHLORIDE (POUR BTL) OPTIME
TOPICAL | Status: DC | PRN
  Administered 2021-12-13: 1000 mL

## 2021-12-13 MED ORDER — OXYCODONE HCL 5 MG PO TABS
2.5000 mg | ORAL_TABLET | ORAL | Status: DC | PRN
  Administered 2021-12-13: 5 mg via ORAL
  Filled 2021-12-13: qty 1

## 2021-12-13 MED ORDER — PROPOFOL 10 MG/ML IV BOLUS
INTRAVENOUS | Status: DC | PRN
  Administered 2021-12-13: 120 mg via INTRAVENOUS

## 2021-12-13 MED ORDER — ACETAMINOPHEN 500 MG PO TABS
1000.0000 mg | ORAL_TABLET | Freq: Once | ORAL | Status: AC
  Administered 2021-12-13: 1000 mg via ORAL
  Filled 2021-12-13: qty 2

## 2021-12-13 MED ORDER — DONEPEZIL HCL 10 MG PO TABS
10.0000 mg | ORAL_TABLET | Freq: Every day | ORAL | Status: DC
  Administered 2021-12-13: 10 mg via ORAL
  Filled 2021-12-13: qty 1

## 2021-12-13 MED ORDER — ONDANSETRON HCL 4 MG/2ML IJ SOLN
4.0000 mg | Freq: Four times a day (QID) | INTRAMUSCULAR | Status: DC | PRN
  Administered 2021-12-14: 4 mg via INTRAVENOUS
  Filled 2021-12-13: qty 2

## 2021-12-13 MED ORDER — PHENYLEPHRINE 40 MCG/ML (10ML) SYRINGE FOR IV PUSH (FOR BLOOD PRESSURE SUPPORT)
PREFILLED_SYRINGE | INTRAVENOUS | Status: DC | PRN
  Administered 2021-12-13 (×2): 160 ug via INTRAVENOUS
  Administered 2021-12-13: 80 ug via INTRAVENOUS

## 2021-12-13 MED ORDER — HYDROCORTISONE (PERIANAL) 2.5 % EX CREA
1.0000 "application " | TOPICAL_CREAM | Freq: Every day | CUTANEOUS | Status: DC | PRN
  Filled 2021-12-13: qty 28.35

## 2021-12-13 MED ORDER — OXYCODONE HCL 5 MG PO TABS
ORAL_TABLET | ORAL | Status: AC
Start: 2021-12-13 — End: 2021-12-14
  Filled 2021-12-13: qty 1

## 2021-12-13 MED ORDER — DOCUSATE SODIUM 100 MG PO CAPS
100.0000 mg | ORAL_CAPSULE | Freq: Two times a day (BID) | ORAL | Status: DC
  Administered 2021-12-13 – 2021-12-14 (×2): 100 mg via ORAL
  Filled 2021-12-13 (×3): qty 1

## 2021-12-13 MED ORDER — ONDANSETRON HCL 4 MG/2ML IJ SOLN
INTRAMUSCULAR | Status: DC | PRN
  Administered 2021-12-13: 4 mg via INTRAVENOUS

## 2021-12-13 MED ORDER — ASPIRIN 325 MG PO TABS
325.0000 mg | ORAL_TABLET | Freq: Every day | ORAL | Status: DC
  Administered 2021-12-14: 325 mg via ORAL
  Filled 2021-12-13 (×2): qty 1

## 2021-12-13 MED ORDER — CEFAZOLIN SODIUM-DEXTROSE 2-4 GM/100ML-% IV SOLN
2.0000 g | Freq: Three times a day (TID) | INTRAVENOUS | Status: AC
  Administered 2021-12-13 – 2021-12-14 (×3): 2 g via INTRAVENOUS
  Filled 2021-12-13 (×3): qty 100

## 2021-12-13 MED ORDER — FENTANYL CITRATE (PF) 100 MCG/2ML IJ SOLN
INTRAMUSCULAR | Status: DC | PRN
  Administered 2021-12-13: 50 ug via INTRAVENOUS
  Administered 2021-12-13: 25 ug via INTRAVENOUS
  Administered 2021-12-13: 50 ug via INTRAVENOUS

## 2021-12-13 MED ORDER — HYDRALAZINE HCL 10 MG PO TABS: 10.0000 mg | ORAL_TABLET | Freq: Four times a day (QID) | ORAL | Status: DC | PRN

## 2021-12-13 MED ORDER — METOPROLOL SUCCINATE ER 25 MG PO TB24
50.0000 mg | ORAL_TABLET | Freq: Every day | ORAL | Status: DC
  Administered 2021-12-14: 50 mg via ORAL
  Filled 2021-12-13 (×2): qty 2

## 2021-12-13 MED ORDER — ASCORBIC ACID 500 MG PO TABS
500.0000 mg | ORAL_TABLET | Freq: Every day | ORAL | Status: DC
  Administered 2021-12-13 – 2021-12-14 (×2): 500 mg via ORAL
  Filled 2021-12-13 (×2): qty 1

## 2021-12-13 MED ORDER — POLYETHYLENE GLYCOL 3350 17 G PO PACK: 17.0000 g | PACK | Freq: Every day | ORAL | Status: DC | PRN

## 2021-12-13 MED ORDER — LIDOCAINE 2% (20 MG/ML) 5 ML SYRINGE
INTRAMUSCULAR | Status: AC
  Filled 2021-12-13: qty 5

## 2021-12-13 MED ORDER — EPHEDRINE SULFATE-NACL 50-0.9 MG/10ML-% IV SOSY
PREFILLED_SYRINGE | INTRAVENOUS | Status: DC | PRN
  Administered 2021-12-13: 10 mg via INTRAVENOUS
  Administered 2021-12-13: 5 mg via INTRAVENOUS

## 2021-12-13 MED ORDER — PANTOPRAZOLE SODIUM 40 MG PO TBEC
40.0000 mg | DELAYED_RELEASE_TABLET | Freq: Two times a day (BID) | ORAL | Status: DC
  Administered 2021-12-13 – 2021-12-14 (×2): 40 mg via ORAL
  Filled 2021-12-13 (×2): qty 1

## 2021-12-13 MED ORDER — LIDOCAINE 2% (20 MG/ML) 5 ML SYRINGE
INTRAMUSCULAR | Status: DC | PRN
  Administered 2021-12-13: 40 mg via INTRAVENOUS

## 2021-12-13 MED ORDER — METOCLOPRAMIDE HCL 5 MG/ML IJ SOLN: 5.0000 mg | Freq: Three times a day (TID) | INTRAMUSCULAR | Status: DC | PRN

## 2021-12-13 MED ORDER — BUPIVACAINE-EPINEPHRINE 0.5% -1:200000 IJ SOLN
INTRAMUSCULAR | Status: DC | PRN
  Administered 2021-12-13: 30 mL

## 2021-12-13 MED ORDER — FERROUS SULFATE 325 (65 FE) MG PO TABS
325.0000 mg | ORAL_TABLET | Freq: Two times a day (BID) | ORAL | Status: DC
  Administered 2021-12-13 – 2021-12-14 (×2): 325 mg via ORAL
  Filled 2021-12-13 (×2): qty 1

## 2021-12-13 MED ORDER — BUPIVACAINE-EPINEPHRINE 0.5% -1:200000 IJ SOLN
INTRAMUSCULAR | Status: AC
  Filled 2021-12-13: qty 1

## 2021-12-13 MED ORDER — CEFAZOLIN SODIUM-DEXTROSE 2-4 GM/100ML-% IV SOLN
2.0000 g | INTRAVENOUS | Status: AC
  Administered 2021-12-13: 2 g via INTRAVENOUS
  Filled 2021-12-13: qty 100

## 2021-12-13 MED ORDER — FENTANYL CITRATE (PF) 250 MCG/5ML IJ SOLN
INTRAMUSCULAR | Status: AC
  Filled 2021-12-13: qty 5

## 2021-12-13 MED ORDER — TRAMADOL HCL 50 MG PO TABS
ORAL_TABLET | ORAL | Status: AC
  Administered 2021-12-13: 50 mg
  Filled 2021-12-13: qty 2

## 2021-12-13 MED ORDER — UMECLIDINIUM BROMIDE 62.5 MCG/ACT IN AEPB
1.0000 | INHALATION_SPRAY | Freq: Every day | RESPIRATORY_TRACT | Status: DC
  Filled 2021-12-13: qty 7

## 2021-12-13 MED ORDER — PHENYLEPHRINE 40 MCG/ML (10ML) SYRINGE FOR IV PUSH (FOR BLOOD PRESSURE SUPPORT)
PREFILLED_SYRINGE | INTRAVENOUS | Status: AC
  Filled 2021-12-13: qty 10

## 2021-12-13 MED ORDER — DEXAMETHASONE SODIUM PHOSPHATE 10 MG/ML IJ SOLN
INTRAMUSCULAR | Status: AC
  Filled 2021-12-13: qty 1

## 2021-12-13 SURGICAL SUPPLY — 84 items
APL PRP STRL LF DISP 70% ISPRP (MISCELLANEOUS) ×2
BAG COUNTER SPONGE SURGICOUNT (BAG) ×3 IMPLANT
BAG SPNG CNTER NS LX DISP (BAG) ×1
BANDAGE ESMARK 6X9 LF (GAUZE/BANDAGES/DRESSINGS) ×2 IMPLANT
BIT DRILL CALIB QC 170X80 (BIT) ×1 IMPLANT
BIT DRILL QC SFS 2.5X170 (BIT) ×1 IMPLANT
BLADE CLIPPER SURG (BLADE) IMPLANT
BLADE SURG 15 STRL LF DISP TIS (BLADE) ×2 IMPLANT
BLADE SURG 15 STRL SS (BLADE) ×2
BNDG CMPR 9X6 STRL LF SNTH (GAUZE/BANDAGES/DRESSINGS) ×1
BNDG CMPR MED 10X6 ELC LF (GAUZE/BANDAGES/DRESSINGS) ×1
BNDG ELASTIC 4X5.8 VLCR STR LF (GAUZE/BANDAGES/DRESSINGS) ×3 IMPLANT
BNDG ELASTIC 6X10 VLCR STRL LF (GAUZE/BANDAGES/DRESSINGS) ×1 IMPLANT
BNDG ELASTIC 6X5.8 VLCR STR LF (GAUZE/BANDAGES/DRESSINGS) ×3 IMPLANT
BNDG ESMARK 6X9 LF (GAUZE/BANDAGES/DRESSINGS) ×2
BNDG GAUZE ELAST 4 BULKY (GAUZE/BANDAGES/DRESSINGS) ×3 IMPLANT
BONE CANC CHIPS 20CC PCAN1/4 (Bone Implant) ×2 IMPLANT
BRUSH SCRUB EZ PLAIN DRY (MISCELLANEOUS) ×6 IMPLANT
CANISTER SUCT 3000ML PPV (MISCELLANEOUS) ×2 IMPLANT
CHIPS CANC BONE 20CC PCAN1/4 (Bone Implant) ×1 IMPLANT
CHLORAPREP W/TINT 26 (MISCELLANEOUS) ×6 IMPLANT
CNTNR URN SCR LID CUP LEK RST (MISCELLANEOUS) IMPLANT
CONT SPEC 4OZ STRL OR WHT (MISCELLANEOUS) ×2
COVER SURGICAL LIGHT HANDLE (MISCELLANEOUS) ×3 IMPLANT
CUFF TOURN SGL QUICK 34 (TOURNIQUET CUFF) ×2
CUFF TRNQT CYL 34X4.125X (TOURNIQUET CUFF) ×2 IMPLANT
DRAPE C-ARM 42X72 X-RAY (DRAPES) ×3 IMPLANT
DRAPE C-ARMOR (DRAPES) ×3 IMPLANT
DRAPE ORTHO SPLIT 77X108 STRL (DRAPES) ×4
DRAPE SURG ORHT 6 SPLT 77X108 (DRAPES) ×4 IMPLANT
DRAPE U-SHAPE 47X51 STRL (DRAPES) ×3 IMPLANT
DRSG ADAPTIC 3X8 NADH LF (GAUZE/BANDAGES/DRESSINGS) ×1 IMPLANT
DRSG PAD ABDOMINAL 8X10 ST (GAUZE/BANDAGES/DRESSINGS) ×6 IMPLANT
ELECT REM PT RETURN 9FT ADLT (ELECTROSURGICAL) ×2
ELECTRODE REM PT RTRN 9FT ADLT (ELECTROSURGICAL) ×2 IMPLANT
GAUZE SPONGE 4X4 12PLY STRL (GAUZE/BANDAGES/DRESSINGS) ×3 IMPLANT
GLOVE SURG ENC MOIS LTX SZ6.5 (GLOVE) ×9 IMPLANT
GLOVE SURG ENC MOIS LTX SZ7.5 (GLOVE) ×12 IMPLANT
GLOVE SURG UNDER POLY LF SZ6.5 (GLOVE) ×3 IMPLANT
GLOVE SURG UNDER POLY LF SZ7.5 (GLOVE) ×3 IMPLANT
GOWN STRL REUS W/ TWL LRG LVL3 (GOWN DISPOSABLE) ×4 IMPLANT
GOWN STRL REUS W/TWL LRG LVL3 (GOWN DISPOSABLE) ×4
GRAFT BNE CANC CHIPS 1-8 20CC (Bone Implant) IMPLANT
IMMOBILIZER KNEE 22 UNIV (SOFTGOODS) ×3 IMPLANT
KIT BASIN OR (CUSTOM PROCEDURE TRAY) ×3 IMPLANT
KIT TURNOVER KIT B (KITS) ×3 IMPLANT
NDL SUT 6 .5 CRC .975X.05 MAYO (NEEDLE) ×2 IMPLANT
NEEDLE MAYO TAPER (NEEDLE) ×2
NS IRRIG 1000ML POUR BTL (IV SOLUTION) ×3 IMPLANT
PACK TOTAL JOINT (CUSTOM PROCEDURE TRAY) ×3 IMPLANT
PAD ABD 8X10 STRL (GAUZE/BANDAGES/DRESSINGS) ×1 IMPLANT
PAD ARMBOARD 7.5X6 YLW CONV (MISCELLANEOUS) ×5 IMPLANT
PAD CAST 4YDX4 CTTN HI CHSV (CAST SUPPLIES) ×2 IMPLANT
PADDING CAST COTTON 4X4 STRL (CAST SUPPLIES) ×2
PADDING CAST COTTON 6X4 STRL (CAST SUPPLIES) ×3 IMPLANT
PLATE PROX TIBIA 6H LT 3.5 VA (Plate) ×1 IMPLANT
SCREW CORTEX 3.5 30MM (Screw) ×2 IMPLANT
SCREW CORTEX 3.5 38MM (Screw) ×1 IMPLANT
SCREW CORTEX 3.5X75MM (Screw) ×2 IMPLANT
SCREW LOCK CORT ST 3.5X30 (Screw) IMPLANT
SCREW LOCK CORT ST 3.5X38 (Screw) IMPLANT
SCREW LOCKING 3.5X70MM VA (Screw) ×1 IMPLANT
SCREW LOCKING VA 3.5X32MM (Screw) ×1 IMPLANT
SCREW VA-LOCKING 65MM 3.5 (Screw) ×3 IMPLANT
STAPLER VISISTAT 35W (STAPLE) ×3 IMPLANT
SUCTION FRAZIER HANDLE 10FR (MISCELLANEOUS) ×2
SUCTION TUBE FRAZIER 10FR DISP (MISCELLANEOUS) ×2 IMPLANT
SUT ETHILON 2 0 FS 18 (SUTURE) ×3 IMPLANT
SUT ETHILON 3 0 PS 1 (SUTURE) ×1 IMPLANT
SUT FIBERWIRE #2 38 T-5 BLUE (SUTURE)
SUT VIC AB 0 CT1 27 (SUTURE) ×2
SUT VIC AB 0 CT1 27XBRD ANBCTR (SUTURE) IMPLANT
SUT VIC AB 1 CT1 18XCR BRD 8 (SUTURE) IMPLANT
SUT VIC AB 1 CT1 27 (SUTURE) ×2
SUT VIC AB 1 CT1 27XBRD ANBCTR (SUTURE) ×2 IMPLANT
SUT VIC AB 1 CT1 8-18 (SUTURE) ×2
SUT VIC AB 2-0 CT1 27 (SUTURE) ×4
SUT VIC AB 2-0 CT1 TAPERPNT 27 (SUTURE) ×4 IMPLANT
SUTURE FIBERWR #2 38 T-5 BLUE (SUTURE) IMPLANT
TOWEL GREEN STERILE (TOWEL DISPOSABLE) ×6 IMPLANT
TRAY FOLEY MTR SLVR 16FR STAT (SET/KITS/TRAYS/PACK) IMPLANT
TUBE CONNECTING 12X1/4 (SUCTIONS) ×1 IMPLANT
WATER STERILE IRR 1000ML POUR (IV SOLUTION) ×6 IMPLANT
YANKAUER SUCT BULB TIP NO VENT (SUCTIONS) ×1 IMPLANT

## 2021-12-13 NOTE — Progress Notes (Signed)
PT Cancellation Note  Patient Details Name: Holly [name] MRN: [id number] DOB: 20-Dec-1945   Cancelled Treatment:    Reason Eval/Treat Not Completed: Other (comment).  New ORIF, will retry at anther time when pt is more medically able.   Ramond Dial 12/13/2021, 1:46 PM   Mee Hives, PT PhD Acute Rehab Dept. Number: [place] [facility] [place]

## 2021-12-13 NOTE — Anesthesia Procedure Notes (Signed)
Procedure Name: Intubation Date/Time: 12/13/2021 7:41 AM Performed by: Barrington Ellison, CRNA Pre-anesthesia Checklist: Patient identified, Emergency Drugs available, Suction available and Patient being monitored Patient Re-evaluated:Patient Re-evaluated prior to induction Oxygen Delivery Method: Circle System Utilized Preoxygenation: Pre-oxygenation with 100% oxygen Induction Type: IV induction Ventilation: Mask ventilation without difficulty Laryngoscope Size: Mac and 3 Grade View: Grade I Tube type: Oral Tube size: 7.0 mm Number of attempts: 1 Airway Equipment and Method: Stylet and Oral airway Placement Confirmation: ETT inserted through vocal cords under direct vision, positive ETCO2 and breath sounds checked- equal and bilateral Secured at: 20 cm Tube secured with: Tape Dental Injury: Teeth and Oropharynx as per pre-operative assessment

## 2021-12-13 NOTE — Transfer of Care (Signed)
Immediate Anesthesia Transfer of Care Note  Patient: Holly [name]  Procedure(s) Performed: OPEN REDUCTION INTERNAL FIXATION (ORIF) TIBIAL PLATEAU (Left: Knee) BONE BIOPSY (Left: Knee)  Patient Location: PACU  Anesthesia Type:General  Level of Consciousness: awake  Airway & Oxygen Therapy: Patient Spontanous Breathing and Patient connected to face mask oxygen  Post-op Assessment: Report given to RN  Post vital signs: Reviewed and stable  Last Vitals:  Vitals Value Taken Time  BP 158/64 12/13/21 0929  Temp    Pulse 66 12/13/21 0928  Resp 15 12/13/21 0928  SpO2 100 % 12/13/21 0928  Vitals shown include unvalidated device data.  Last Pain:  Vitals:   12/13/21 0645  TempSrc:   PainSc: 0-No pain         Complications: No notable events documented.

## 2021-12-13 NOTE — Op Note (Signed)
Orthopaedic Surgery Operative Note (CSN: [id number] ) Date of Surgery: 12/13/2021  Admit Date: 12/13/2021   Diagnoses: Pre-Op Diagnoses: Pathologic left tibial plateau fracture  Post-Op Diagnosis: Same  Procedures: CPT 16384-YKZL reduction internal fixation of left tibial plateau fracture CPT 20245-Open biopsy of left proximal tibia  Surgeons : Primary: Shona Needles, MD  Assistant: Patrecia Pace, PA-C  Location: OR 3   Anesthesia:General   Antibiotics: Ancef 2g preop with 1 gm vancomycin powder placed topically   Tourniquet time:* Missing tourniquet times found for documented tourniquets in log: [id number] *  Estimated Blood DJTT:01 mL  Complications:None  Specimens: ID Type Source Tests Collected by Time Destination  1 : Left proximal tibia Tissue PATH Bone biopsy SURGICAL PATHOLOGY Amilcar Reever, Thomasene Lot, MD 12/13/2021 [phone]   A : Left Proximal Tibia Tissue PATH Soft tissue FUNGUS CULTURE WITH STAIN, AEROBIC/ANAEROBIC CULTURE W GRAM STAIN (SURGICAL/DEEP WOUND) Shona Needles, MD 12/13/2021 [phone]      Implants: Implant Name Type Inv. Item Serial No. Manufacturer Lot No. LRB No. Used Action  BONE CANC CHIPS 20CC PCAN1/4 - [id number] Bone Implant BONE [facility] CHIPS [facility] PCAN1/4  [employer] [id number] Left 1 Implanted  SCREW CORTEX 3.5X75MM - [id number] Screw SCREW CORTEX 3.5X75MM  [employer]PUY [employer] ON TRAY Left 2 Implanted  SCREW CORTEX 3.5 30MM - [id number] Screw SCREW CORTEX 3.5 30MM  DEPUY ORTHOPAEDICS ON TRAY Left 1 Implanted  PLATE PROX TIBIA 6H LT 3.5 VA - [id number] Plate PLATE PROX TIBIA 6H LT 3.5 VA  DEPUY ORTHOPAED[employer] ON TRAY Left 1 Implanted  SCREW CORTEX 3.5 38MM - [id number] Screw SCREW CORTEX 3.5 38MM  DEPUY ORTHOPAEDICS ON TRAY Left 1 Implanted  SCREW LOCKING VA 3.5X32MM - [id number] Screw SCREW LOCKING VA 3.5X32MM  DEPUY ORTHOPAED[employer] ON TRAY Left 1 Implanted  SCREW VA-LOCKING 65MM 3.5 - [id number] Screw SCREW VA-LOCKING 65MM 3.5  DEPUY ORTHOPAEDICS ON TRAY Left 3 Implanted   SCREW LOCKING 3.5X70MM VA - [id number] Screw SCREW LOCKING 3.5X70MM VA  DEPUY ORTHOPAEDICS ON TRAY Left 1 Implanted     Indications for Surgery: 76 year old female who had acute onset left knee pain radiographs did not show any acute fracture.  She does have a history of lung cancer but no known metastasis when initially evaluated.  She continued to have severe pain especially with weightbearing and movement of the knee.  A CT scan was eventually obtained which showed pathologic tibial plateau fracture with significant lesion in the proximal tibia.  Due to the pathologic nature of her injury and continued pain I recommend proceeding with open reduction internal fixation with deep bone biopsy of the proximal tibia.  Risks and benefits were discussed with the patient.  Risks included but not limited to bleeding, infection, malunion, nonunion, hardware failure, hardware irritation, persistent pain, need for adjunct treatments including radiation therapy or chemotherapy, stiffness of the knee, even the possibility anesthetic complications.  She agreed to proceed with surgery and consent was obtained.  Operative Findings: 1.  Deep bone biopsy of the proximal tibia through cortical window of the lateral condyle with specimen sent to pathology as well as microbiology. 2.  Open reduction internal fixation of left tibial plateau fracture using Synthes VA 3.5 mm proximal tibial locking plate.  Lateral joint was elevated and backfilled with crushed cancellous allograft.  Procedure: The patient was identified in the preoperative holding area. Consent was confirmed with the patient and their family and all questions were answered. The operative extremity was marked after confirmation with the patient. she was then  brought back to the operating room by our anesthesia colleagues.  She was carefully transferred over to a radiolucent flat top table.  She was placed under general anesthetic.  A bump was placed under her  operative hip.  The left lower extremity was then prepped and draped in usual sterile fashion.  A timeout was performed to verify the patient, the procedure, and the extremity.  Preoperative antibiotics were dosed.  Fluoroscopic imaging showed the tibial plateau fracture.  The bone lesion was not able to fully visualized on the x-ray but CT scan was used to confirm its presence.  The tourniquet was inflated to 250 mmHg.  Total tourniquet time as noted above.  A lateral parapatellar incision was then made and carried down through skin and subcutaneous tissue.  I incised through the IT band and mobilized the IT band off the lateral condyle.  I exposed the anterior compartment of the distal lower leg and reflected the muscle belly off of the proximal tibia.  I then performed a submeniscal arthrotomy to expose the joint which was depressed down centrally in the lateral condyle.  I then used a 2.5 mm drill bit to drill a cortical window and completed it with a osteotome to enter the metaphysis to backfill and elevate the articular surface as well as to biopsy the proximal tibial lesion.  Using a pituitary rongeur I then grasped the soft tissue which had a fibrinous appearance to it.  I sent this to pathology.  I also sent it for culture as well to make sure there was no associated infection.  At this point I used a footed tamp to elevate the lateral joint.  I then used crushed cancellous allograft to backfilled the defect left by the bone biopsy and the elevating of the articular surface.  I then chose a Synthes 3.5 mm proximal tibial locking plate and slid this submuscularly along the lateral cortex of the tibia.  I held provisionally proximally with a 1.6 mm K wire.  I then drilled and placed a nonlocking screw proximally to bring the plate flush to bone.  I then percutaneously placed a 3.5 mm nonlocking screw into the tibial shaft to bring the distal portion of plate flush to bone.  I then placed a locking screw  into the tibial shaft as well as another nonlocking screw.  Proximally I placed a total of 4 locking screws in addition to the initial nonlocking screw.  Final fluoroscopic imaging was obtained.  The incision was copiously irrigated.  A gram of vancomycin powder was placed into the incision.  A free needle was used to tie down the capsule to the plate.  The IT band was closed with #1 Vicryl suture.  The skin was closed with 2-0 Vicryl and 3-0 Monocryl.  Local anesthetic was injected sterilely into the surgical site.  Steri-Strips were used to reinforce the skin.  Sterile dressing was applied.  Post Op Plan/Instructions: Patient be nonweightbearing to the left lower extremity.  She will receive postoperative Ancef.  She will receive aspirin for DVT prophylaxis.  We will have her mobilize with physical and Occupational Therapy.  I was present and performed the entire surgery.  Patrecia Pace, PA-C did assist me throughout the case. An assistant was necessary given the difficulty in approach, maintenance of reduction and ability to instrument the fracture.   Katha Hamming, MD [facility]

## 2021-12-13 NOTE — Anesthesia Postprocedure Evaluation (Signed)
Anesthesia Post Note  Patient: Marcelyn Ditty  Procedure(s) Performed: OPEN REDUCTION INTERNAL FIXATION (ORIF) TIBIAL PLATEAU (Left: Knee) BONE BIOPSY (Left: Knee)     Patient location during evaluation: PACU Anesthesia Type: General Level of consciousness: awake and alert Pain management: pain level controlled Vital Signs Assessment: post-procedure vital signs reviewed and stable Respiratory status: spontaneous breathing, nonlabored ventilation, respiratory function stable and patient connected to nasal cannula oxygen Cardiovascular status: blood pressure returned to baseline and stable Postop Assessment: no apparent nausea or vomiting Anesthetic complications: no   No notable events documented.  Last Vitals:  Vitals:   12/13/21 1223 12/13/21 1600  BP: 126/66 (!) 112/58  Pulse: 64 61  Resp: 16 16  Temp: 36.6 C 36.5 C  SpO2: 98% 99%    Last Pain:  Vitals:   12/13/21 1600  TempSrc: Oral  PainSc:                  March Rummage Daniyla Pfahler

## 2021-12-14 ENCOUNTER — Encounter (HOSPITAL_COMMUNITY): Payer: Self-pay | Admitting: Student

## 2021-12-14 LAB — CBC
HCT: 26.9 % — ABNORMAL LOW (ref 36.0–46.0)
Hemoglobin: 7.9 g/dL — ABNORMAL LOW (ref 12.0–15.0)
MCH: 28.1 pg (ref 26.0–34.0)
MCHC: 29.4 g/dL — ABNORMAL LOW (ref 30.0–36.0)
MCV: 95.7 fL (ref 80.0–100.0)
Platelets: 188 10*3/uL (ref 150–400)
RBC: 2.81 MIL/uL — ABNORMAL LOW (ref 3.87–5.11)
RDW: 16.6 % — ABNORMAL HIGH (ref 11.5–15.5)
WBC: 9.5 10*3/uL (ref 4.0–10.5)
nRBC: 0 % (ref 0.0–0.2)

## 2021-12-14 LAB — BASIC METABOLIC PANEL
Anion gap: 8 (ref 5–15)
BUN: 17 mg/dL (ref 8–23)
CO2: 32 mmol/L (ref 22–32)
Calcium: 9 mg/dL (ref 8.9–10.3)
Chloride: 97 mmol/L — ABNORMAL LOW (ref 98–111)
Creatinine, Ser: 1.34 mg/dL — ABNORMAL HIGH (ref 0.44–1.00)
GFR, Estimated: 41 mL/min — ABNORMAL LOW (ref >60)
Glucose, Bld: 163 mg/dL — ABNORMAL HIGH (ref 70–99)
Potassium: 4.8 mmol/L (ref 3.5–5.1)
Sodium: 137 mmol/L (ref 135–145)

## 2021-12-14 LAB — SURGICAL PATHOLOGY

## 2021-12-14 LAB — GLUCOSE, CAPILLARY: Glucose-Capillary: 182 mg/dL — ABNORMAL HIGH (ref 70–99)

## 2021-12-14 MED ORDER — ASPIRIN 325 MG PO TABS: 325.0000 mg | ORAL_TABLET | Freq: Two times a day (BID) | ORAL | 0 refills | Status: DC

## 2021-12-14 MED ORDER — VITAMIN D (ERGOCALCIFEROL) 1.25 MG (50000 UNIT) PO CAPS: 50000.0000 [IU] | ORAL_CAPSULE | ORAL | 0 refills | Status: DC

## 2021-12-14 MED ORDER — OXYCODONE HCL 5 MG PO TABS: 2.5000 mg | ORAL_TABLET | ORAL | 0 refills | Status: DC | PRN

## 2021-12-14 NOTE — TOC Transition Note (Signed)
Transition of Care [facility]) - CM/SW Discharge Note   Patient Details  Name: Holly [name] MRN: [id number] Date of Birth: 06-05-1946  Transition of [facility]) CM/SW Contact:  Marilu Favre, RN Phone Number: 12/14/2021, 1:04 PM   Clinical Narrative:     Spoke to patient and daughter at bedside. Patient from home with daughter, and will have assistance. Patient has a rollator , 3 in 1 , home oxygen and CPAP. Patient's portable oxygen is at bedside.   Ordered rolling walker . Patient states she does not have one.    PT/OT recommending home health PT/OT however daughter understood PA , home health will be started after her post op follow up appointment. Clarified same  with PA.   Transition of Care [facility]) Screening Note   Patient Details  Name: Holly [name] Date of Birth: 11-18-1945   Transition of Care [facility]) CM/SW Contact:    Marilu Favre, RN Phone Number: 12/14/2021, 1:07 PM    Transition of Care Department [facility]) has reviewed patient and no TOC needs have been identified at this time. We will continue to monitor patient advancement through interdisciplinary progression rounds. If new patient transition needs arise, please place a TOC consult.      Final next level of care: Home/Self Care Barriers to Discharge: No Barriers Identified   Patient Goals and CMS Choice Patient states their goals for this hospitalization and ongoing recovery are:: to return to home CMS Medicare.gov Compare Post Acute Care list provided to:: Patient    Discharge Placement                       Discharge Plan and Services   Discharge Planning Services: CM Consult            DME Arranged: Gilford Rile rolling DME Agency: [employer] Date DME Agency Contacted: 12/14/21 Time DME Agency Contacted: [id number] Representative spoke with at DME Agency: Freda Munro HH Arranged: NA [place] Agency: NA        Social Determinants of Health ([place]) Interventions     Readmission Risk  Interventions Readmission Risk Prevention Plan 08/31/2021 07/28/2021 02/18/2021  Transportation Screening Complete Complete Complete  PCP or Specialist Appt within 5-7 Days - - Complete  Home Care Screening - - Complete  Medication Review (RN CM) - - Complete  Medication Review (RN Care Manager) Complete Referral to Pharmacy -  PCP or Specialist appointment within 3-5 days of discharge Complete Complete -  [place] or Home Care Consult Complete Patient refused -  SW Recovery Care/Counseling Consult Complete Complete -  Palliative Care Screening Not Applicable Not Applicable -  [place] Not Applicable Not Applicable -  Some recent data might be hidden

## 2021-12-14 NOTE — Plan of Care (Signed)

## 2021-12-14 NOTE — Progress Notes (Signed)
Patient discharged to home with instructions. 

## 2021-12-14 NOTE — Discharge Instructions (Signed)
[facility] Discharge Instructions   General Discharge Instructions  WEIGHT BEARING STATUS:Non-weightbearing left lower extrmeity  RANGE OF MOTION/ACTIVITY: Ok for unrestricted knee range of motion as tolerated  Wound Care:You may remove your surgical dressing on post-op day #2 (Wednesday 12/15/21). Leave yellow steri-strips in place. Incisions can be left open to air if there is no drainage. If incision continues to have drainage, follow wound care instructions below. Okay to shower if no drainage from incisions. No need to re-apply dressings to knee after showering  DVT/PE prophylaxis: Aspirin 325 mg twice daily x 30 days  Diet: as you were eating previously.  Can use over the counter stool softeners and bowel preparations, such as Miralax, to help with bowel movements.  Narcotics can be constipating.  Be sure to drink plenty of fluids  PAIN MEDICATION USE AND EXPECTATIONS  You have likely been given narcotic medications to help control your pain.  After a traumatic event that results in an fracture (broken bone) with or without surgery, it is ok to use narcotic pain medications to help control one's pain.  We understand that everyone responds to pain differently and each individual patient will be evaluated on a regular basis for the continued need for narcotic medications. Ideally, narcotic medication use should last no more than 6-8 weeks (coinciding with fracture healing).   As a patient it is your responsibility as well to monitor narcotic medication use and report the amount and frequency you use these medications when you come to your office visit.   We would also advise that if you are using narcotic medications, you should take a dose prior to therapy to maximize you participation.  IF YOU ARE ON NARCOTIC MEDICATIONS IT IS NOT PERMISSIBLE TO OPERATE A MOTOR VEHICLE (MOTORCYCLE/CAR/TRUCK/MOPED) OR HEAVY MACHINERY DO NOT MIX NARCOTICS WITH OTHER CNS (CENTRAL NERVOUS  SYSTEM) DEPRESSANTS SUCH AS ALCOHOL   STOP SMOKING OR USING NICOTINE PRODUCTS!!!!  As discussed nicotine severely impairs your body's ability to heal surgical and traumatic wounds but also impairs bone healing.  Wounds and bone heal by forming microscopic blood vessels (angiogenesis) and nicotine is a vasoconstrictor (essentially, shrinks blood vessels).  Therefore, if vasoconstriction occurs to these microscopic blood vessels they essentially disappear and are unable to deliver necessary nutrients to the healing tissue.  This is one modifiable factor that you can do to dramatically increase your chances of healing your injury.    (This means no smoking, no nicotine gum, patches, etc)  DO NOT USE NONSTEROIDAL ANTI-INFLAMMATORY DRUGS (NSAID'S)  Using products such as Advil (ibuprofen), Aleve (naproxen), Motrin (ibuprofen) for additional pain control during fracture healing can delay and/or prevent the healing response.  If you would like to take over the counter (OTC) medication, Tylenol (acetaminophen) is ok.  However, some narcotic medications that are given for pain control contain acetaminophen as well. Therefore, you should not exceed more than 4000 mg of tylenol in a day if you do not have liver disease.  Also note that there are may OTC medicines, such as cold medicines and allergy medicines that my contain tylenol as well.  If you have any questions about medications and/or interactions please ask your doctor/PA or your pharmacist.      ICE AND ELEVATE INJURED/OPERATIVE EXTREMITY  Using ice and elevating the injured extremity above your heart can help with swelling and pain control.  Icing in a pulsatile fashion, such as 20 minutes on and 20 minutes off, can be followed.    Do  not place ice directly on skin. Make sure there is a barrier between to skin and the ice pack.    Using frozen items such as frozen peas works well as the conform nicely to the are that needs to be iced.  USE AN ACE WRAP  OR TED HOSE FOR SWELLING CONTROL  In addition to icing and elevation, Ace wraps or TED hose are used to help limit and resolve swelling.  It is recommended to use Ace wraps or TED hose until you are informed to stop.    When using Ace Wraps start the wrapping distally (farthest away from the body) and wrap proximally (closer to the body)   Example: If you had surgery on your leg or thing and you do not have a splint on, start the ace wrap at the toes and work your way up to the thigh        If you had surgery on your upper extremity and do not have a splint on, start the ace wrap at your fingers and work your way up to the upper arm   [place]: [phone]   VISIT OUR WEBSITE FOR ADDITIONAL INFORMATION: [email or web address]    Discharge Wound Care Instructions  Do NOT apply any ointments, solutions or lotions to pin sites or surgical wounds.  These prevent needed drainage and even though solutions like hydrogen peroxide kill bacteria, they also damage cells lining the pin sites that help fight infection.  Applying lotions or ointments can keep the wounds moist and can cause them to breakdown and open up as well. This can increase the risk for infection. When in doubt call the office.  Surgical incisions should be dressed daily.  If any drainage is noted, use one layer of adaptic or Mepitel, then gauze, Kerlix, and an ace wrap. - These dressing supplies should be available at local medical supply stores [facility], [facility], etc) as well as Management consultant ([facility], [facility], [place], etc)  Once the incision is completely dry and without drainage, it may be left open to air out.  Showering may begin 36-48 hours later.  Cleaning gently with soap and water.

## 2021-12-14 NOTE — Evaluation (Signed)
Physical Therapy Evaluation Patient Details Name: Holly [name] MRN: [id number] DOB: 05/31/1946 Today's Date: 12/14/2021  History of Present Illness  Pt is a 76 y/o F presenting with chronic L knee pain, CT scan on 2/9 revealed L tibial plateau fx, s/p ORIF on 2/27. PMH includes R lung CA, COPD, CHF, anxiety, depression, dyspnea, and UI.  Clinical Impression  Pt admitted with above diagnosis. At baseline pt is independent with ambulation using rollator.  She typically has intermittent support from family but can arrange for 24 hr support at d/c.  Pt has DME including w/c and ramp - does need a RW to assist with transfers.  Pt with good tolerance for activity, transferring with min guard and good adherence to NWB status.  Provided pt with HEP with plan to start further therapy in 2 weeks per daughter. Pt currently with functional limitations due to the deficits listed below (see PT Problem List). Pt will benefit from skilled PT to increase their independence and safety with mobility to allow discharge to the venue listed below.          Recommendations for follow up therapy are one component of a multi-disciplinary discharge planning process, led by the attending physician.  Recommendations may be updated based on patient status, additional functional criteria and insurance authorization.  Follow Up Recommendations Other (comment) (plan to start PT after 2 weeks per daughter and PA)    Assistance Recommended at Discharge Intermittent Supervision/Assistance  Patient can return home with the following  A little help with walking and/or transfers;A little help with bathing/dressing/bathroom;Help with stairs or ramp for entrance    Equipment Recommendations Rolling walker (2 wheels) (only has rollator and standard)  Recommendations for Other Services       Functional Status Assessment Patient has had a recent decline in their functional status and demonstrates the ability to make significant  improvements in function in a reasonable and predictable amount of time.     Precautions / Restrictions Precautions Precautions: Fall Restrictions Weight Bearing Restrictions: Yes LLE Weight Bearing: Non weight bearing      Mobility  Bed Mobility Overal bed mobility: Needs Assistance Bed Mobility: Sit to Supine       Sit to supine: Supervision   General bed mobility comments: Utilized gait belt to assist L LE with cues    Transfers Overall transfer level: Needs assistance Equipment used: Rolling walker (2 wheels) Transfers: Sit to/from Stand, Bed to chair/wheelchair/BSC Sit to Stand: Min guard Stand pivot transfers: Min guard         General transfer comment: Pt performed sit to stand with good hand placement and pivoted toward R side with min guard without difficulty.  Daughter present and educated on guarding and transfers toward R side if able    Ambulation/Gait               General Gait Details: unable today - NWB  Stairs            Wheelchair Mobility    Modified Rankin (Stroke Patients Only)       Balance Overall balance assessment: Needs assistance Sitting-balance support: No upper extremity supported Sitting balance-Leahy Scale: Good     Standing balance support: Bilateral upper extremity supported, Reliant on assistive device for balance Standing balance-Leahy Scale: Poor Standing balance comment: Steady with RW                             Pertinent  Vitals/Pain Pain Assessment Pain Assessment: 0-10 Pain Score: 7  Pain Location: LLE Pain Descriptors / Indicators: Aching, Discomfort Pain Intervention(s): Limited activity within patient's tolerance, Monitored during session, Repositioned, Ice applied    Home Living Family/patient expects to be discharged to:: Private residence Living Arrangements: Children Available Help at Discharge: Family;Available 24 hours/day Type of Home: Mobile home Home Access: Ramped  entrance       Home Layout: One level Home Equipment: BSC/3in1;Rollator (4 wheels);Grab bars - toilet;Grab bars - tub/shower;Shower seat;Wheelchair - manual;Standard Technical sales engineer Comments: daughter works during the day, son is coming in from [place] to stay with pt, daughter assists with IADLs, wears 3L O2 at home CPAP at night    Prior Function Prior Level of Function : Independent/Modified Independent;History of Falls (last six months)             Mobility Comments: uses rollator for short community distances ADLs Comments: daughter assists with shower transfer, daughter assists with LB dressing     Hand Dominance   Dominant Hand: Right    Extremity/Trunk Assessment   Upper Extremity Assessment Upper Extremity Assessment: Defer to OT evaluation;Generalized weakness    Lower Extremity Assessment Lower Extremity Assessment: LLE deficits/detail;RLE deficits/detail RLE Deficits / Details: ROM WFL; MMT 5/5 LLE Deficits / Details: Expected post op changes; ROM: ankle WFL, knee 5 to 80 degrees, hip WFL; MMT: ankle 5/5, knee and hip 3/5 not further tested    Cervical / Trunk Assessment Cervical / Trunk Assessment: Kyphotic  Communication   Communication: No difficulties  Cognition Arousal/Alertness: Awake/alert Behavior During Therapy: WFL for tasks assessed/performed Overall Cognitive Status: Within Functional Limits for tasks assessed                                          General Comments General comments (skin integrity, edema, etc.): VSS on 3 L o2; daughter present    Exercises General Exercises - Lower Extremity Ankle Circles/Pumps: AROM, Both, 10 reps, Supine Quad Sets: AROM, Left, 5 reps, Supine Heel Slides: AROM, Left, 5 reps, Supine Hip ABduction/ADduction: AROM, Left, 5 reps, Supine Straight Leg Raises: AROM, Left, 5 reps, Supine Other Exercises Other Exercises: Educated on HEP (provided handout) to tolerance ; shown AAROM  techniques if able.  Also included PF stretch with gait belt and educated on UE exercises including tricep chair push ups as able and basic AROM   Assessment/Plan    PT Assessment Patient needs continued PT services  PT Problem List Decreased strength;Decreased mobility;Decreased range of motion;Decreased activity tolerance;Cardiopulmonary status limiting activity;Decreased balance;Decreased knowledge of use of DME       PT Treatment Interventions DME instruction;Therapeutic activities;Gait training;Therapeutic exercise;Patient/family education;Stair training;Balance training;Functional mobility training;Modalities    PT Goals (Current goals can be found in the Care Plan section)  Acute Rehab PT Goals Patient Stated Goal: return home PT Goal Formulation: With patient Time For Goal Achievement: 12/28/21 Potential to Achieve Goals: Good    Frequency Min 4X/week     Co-evaluation               AM-PAC PT "6 Clicks" Mobility  Outcome Measure Help needed turning from your back to your side while in a flat bed without using bedrails?: A Little Help needed moving from lying on your back to sitting on the side of a flat bed without using bedrails?: A Little Help needed moving to and from a  bed to a chair (including a wheelchair)?: A Little Help needed standing up from a chair using your arms (e.g., wheelchair or bedside chair)?: A Little Help needed to walk in hospital room?: A Lot Help needed climbing 3-5 steps with a railing? : Total 6 Click Score: 15    End of Session Equipment Utilized During Treatment: Gait belt Activity Tolerance: Patient tolerated treatment well Patient left: in bed;with call bell/phone within reach;with family/visitor present Nurse Communication: Mobility status PT Visit Diagnosis: Other abnormalities of gait and mobility (R26.89);Muscle weakness (generalized) (M62.81)    Time: 1937-9024 PT Time Calculation (min) (ACUTE ONLY): 28 min   Charges:   PT  Evaluation $PT Eval Low Complexity: 1 Low PT Treatments $Therapeutic Exercise: 8-22 mins        Abran Richard, PT Acute Rehab Services Pager [phone] [name] [phone]   Karlton Lemon 12/14/2021, 2:00 PM

## 2021-12-14 NOTE — Evaluation (Addendum)
Occupational Therapy Evaluation Patient Details Name: Holly [name] MRN: [id number] DOB: 10/10/1946 Today's Date: 12/14/2021   History of Present Illness Pt is a 76 y/o F presenting with chronic L knee pain, CT scan on 2/9 revealed L tibial plateau fx, s/p ORIF on 2/27. PMH includes R lung CA, COPD, CHF, anxiety, depression, dyspnea, and UI.   Clinical Impression   Pt reports needing assistance at baseline for ADLs, states daughter assists with LB dressing and shower transfers. Pt uses rollator at baseline for mobility. Daughter lives with pt and states son is coming to stay with pt for as long as she needs at d/c, daughter is taking off work until Monday 12/20/21 to assist. Pt min-max A for ADLs, supervision for bed mobility, and mod A fading to min A +2 for stand pivot transfers. Daughter reports pt will have 24/7 assist at home for ADLs.  Educated pt/daughter on precautions and stand pivot transfer techniques to [facility] and chair. Pt/Daughter able to demo understanding. Pt requiring increased cuing for hand placement and safety to keep RW close to body during transfers. VSS on 3L O2 during session. Educated pt/daughter on bathing sinkside initially if possible for safety until pt more independent with transfers to get to shower chair. Pt presenting with impairments listed below, will follow. Recommend d/c home with assistance and HHOT.     Recommendations for follow up therapy are one component of a multi-disciplinary discharge planning process, led by the attending physician.  Recommendations may be updated based on patient status, additional functional criteria and insurance authorization.   Follow Up Recommendations  Home health OT    Assistance Recommended at Discharge Intermittent Supervision/Assistance  Patient can return home with the following Two people to help with walking and/or transfers;A lot of help with bathing/dressing/bathroom;Assistance with cooking/housework;Assist for  transportation;Help with stairs or ramp for entrance    Functional Status Assessment  Patient has had a recent decline in their functional status and demonstrates the ability to make significant improvements in function in a reasonable and predictable amount of time.  Equipment Recommendations  Other (comment) (RW)    Recommendations for Other Services       Precautions / Restrictions Precautions Precautions: Fall Restrictions Weight Bearing Restrictions: Yes LLE Weight Bearing: Non weight bearing      Mobility Bed Mobility Overal bed mobility: Needs Assistance Bed Mobility: Supine to Sit     Supine to sit: Supervision, HOB elevated     General bed mobility comments: uses bedrails    Transfers Overall transfer level: Needs assistance Equipment used: Rolling walker (2 wheels) Transfers: Bed to chair/wheelchair/BSC, Sit to/from Stand Sit to Stand: Min assist, Mod assist, +2 physical assistance Stand pivot transfers: Min assist, Mod assist, +2 physical assistance         General transfer comment: mod for initial stand pivot transfer, fading to min +2 for safety      Balance Overall balance assessment: Needs assistance Sitting-balance support: Bilateral upper extremity supported, Feet supported Sitting balance-Leahy Scale: Fair     Standing balance support: Bilateral upper extremity supported Standing balance-Leahy Scale: Poor Standing balance comment: able to stand statically with heavy reliance on RW                           ADL either performed or assessed with clinical judgement   ADL Overall ADL's : Needs assistance/impaired Eating/Feeding: Set up;Sitting   Grooming: Set up;Sitting   Upper Body Bathing: Sitting;Min guard  Lower Body Bathing: Maximal assistance;Sitting/lateral leans   Upper Body Dressing : Min guard;Sitting   Lower Body Dressing: Maximal assistance;Sitting/lateral leans   Toilet Transfer: Minimal assistance;+2 for  physical assistance;Moderate assistance;Rolling walker (2 wheels);BSC/3in1;Stand-pivot;Cueing for safety;Cueing for sequencing   Toileting- Clothing Manipulation and Hygiene: Maximal assistance;Sit to/from stand       Functional mobility during ADLs: Minimal assistance;Rolling walker (2 wheels)       Vision   Vision Assessment?: No apparent visual deficits     Perception     Praxis      Pertinent Vitals/Pain Pain Assessment Pain Assessment: Faces Pain Score: 3  Faces Pain Scale: Hurts a little bit Pain Location: LLE Pain Descriptors / Indicators: Aching, Discomfort Pain Intervention(s): Limited activity within patient's tolerance     Hand Dominance     Extremity/Trunk Assessment Upper Extremity Assessment Upper Extremity Assessment: Generalized weakness   Lower Extremity Assessment Lower Extremity Assessment: Defer to PT evaluation   Cervical / Trunk Assessment Cervical / Trunk Assessment: Kyphotic   Communication Communication Communication: No difficulties   Cognition Arousal/Alertness: Awake/alert Behavior During Therapy: Anxious Overall Cognitive Status: Within Functional Limits for tasks assessed                                 General Comments: pt anxious regarding pain and falling     General Comments  on 3L O2 throughout session    Exercises     Shoulder Instructions      Home Living Family/patient expects to be discharged to:: Private residence Living Arrangements: Children Available Help at Discharge: Available PRN/intermittently;Family Type of Home: Mobile home Home Access: Ramped entrance     Home Layout: One level     Bathroom Shower/Tub: Occupational psychologist: Handicapped height Bathroom Accessibility: Yes How Accessible: Accessible via walker Home Equipment: BSC/3in1;Rollator (4 wheels);Grab bars - toilet;Grab bars - tub/shower;Shower seat;Wheelchair - manual;Standard Administrator, sports Comments:  daughter works during the day, son is coming in from [place] to stay with pt, daughter assists with IADLs, wears 3L O2 at home CPAP at night      Prior Functioning/Environment Prior Level of Function : Independent/Modified Independent;History of Falls (last six months)             Mobility Comments: uses rollator ADLs Comments: daughter assists with shower transfer, daughter assists with LB dressing        OT Problem List: Decreased strength;Decreased activity tolerance;Decreased range of motion;Impaired balance (sitting and/or standing);Decreased knowledge of use of DME or AE;Decreased safety awareness;Cardiopulmonary status limiting activity      OT Treatment/Interventions: Self-care/ADL training;Therapeutic exercise;Energy conservation;Therapeutic activities;Patient/family education;Balance training    OT Goals(Current goals can be found in the care plan section) Acute Rehab OT Goals Patient Stated Goal: to go home OT Goal Formulation: With patient Time For Goal Achievement: 12/28/21 Potential to Achieve Goals: Good ADL Goals Pt Will Perform Upper Body Dressing: with supervision;sitting Pt Will Perform Lower Body Dressing: with mod assist;sit to/from stand;sitting/lateral leans Pt Will Transfer to Toilet: with min guard assist;bedside commode;stand pivot transfer Pt Will Perform Tub/Shower Transfer: Shower transfer;ambulating;shower seat;rolling walker;Stand pivot transfer;with min guard assist  OT Frequency: Min 2X/week    Co-evaluation              AM-PAC OT "6 Clicks" Daily Activity     Outcome Measure Help from another person eating meals?: None Help from another person taking care of personal  grooming?: None Help from another person toileting, which includes using toliet, bedpan, or urinal?: A Lot Help from another person bathing (including washing, rinsing, drying)?: A Lot Help from another person to put on and taking off regular upper body clothing?: A  Little Help from another person to put on and taking off regular lower body clothing?: Total 6 Click Score: 16   End of Session Equipment Utilized During Treatment: Gait belt;Rolling walker (2 wheels);Oxygen Nurse Communication: Mobility status  Activity Tolerance: Patient tolerated treatment well;Patient limited by fatigue Patient left: in chair;with call bell/phone within reach;with chair alarm set  OT Visit Diagnosis: Unsteadiness on feet (R26.81);Other abnormalities of gait and mobility (R26.89);Muscle weakness (generalized) (M62.81)                Time: 4540-9811 OT Time Calculation (min): 55 min Charges:  OT General Charges $OT Visit: 1 Visit OT Evaluation $OT Eval Moderate Complexity: 1 Mod OT Treatments $Self Care/Home Management : 38-52 mins  Lynnda Child, OTD, OTR/L Acute Rehab [phone]) [phone] [place] 12/14/2021, 10:36 AM

## 2021-12-14 NOTE — Discharge Summary (Signed)
[facility] (OTS) Discharge Summary   Patient ID: Holly [name] MRN: [id number] DOB/AGE: 11-09-1945 76 y.o.  Admit date: 12/13/2021 Discharge date: 12/14/2021  Admission Diagnoses: Left tibial plateau fracture  Discharge Diagnoses:  Principal Problem:   Closed fracture of left tibial plateau   Past Medical History:  Diagnosis Date   Anemia    blood transfusion November 2022   Anxiety    Asthma    Basal cell carcinoma    lung cancer.  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 Uses 2 L    Foot swelling    left- has shown to Drs.   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  Was taken to the operating room by Dr. Doreatha Martin for the above procedure, tolerated this well without complications.  Was admitted overnight for pain control and observation.  Was instructed to be nonweightbearing on the left lower extremity.  Was allowed unrestricted knee range of motion as tolerated.  Patient began working with physical and occupational therapy starting on postoperative day #1.  Was started on aspirin for DVT prophylaxis starting on postoperative day #1. On 12/14/2021, the patient was tolerating diet, working well with therapies, pain well  controlled, vital signs stable, dressings clean, dry, intact and felt stable for discharge to home. Patient will follow up as below and knows to call with questions or concerns.     Consults: None  Significant Diagnostic Studies:   Results for orders placed or performed during the hospital encounter of 12/13/21 (from the past 168 hour(s))  SARS Coronavirus 2 by RT PCR (hospital order, performed in [facility] hospital lab) Nasopharyngeal Nasopharyngeal Swab   Collection Time: 12/13/21  6:14 AM   Specimen: Nasopharyngeal Swab  Result Value Ref Range   SARS Coronavirus 2 NEGATIVE NEGATIVE  Aerobic/Anaerobic Culture w Gram Stain (surgical/deep wound)   Collection Time: 12/13/21  8:19 AM   Specimen: PATH Soft tissue  Result Value Ref Range   Specimen Description TISSUE    Special Requests LEFT PROXIMAL TIBIA    Gram Stain      NO SQUAMOUS EPITHELIAL CELLS SEEN FEW WBC SEEN NO ORGANISMS SEEN    Culture      NO GROWTH < 24 HOURS Performed at [facility], [place] [address], [place], [place] [address]    Report Status PENDING   VITAMIN D 25 Hydroxy (Vit-D Deficiency, Fractures)   Collection Time: 12/13/21 12:41 PM  Result Value Ref Range   Vit D, 25-Hydroxy 12.46 (L) 30 - 100 ng/mL  Basic metabolic panel   Collection Time:  12/14/21  1:07 AM  Result Value Ref Range   Sodium 137 135 - 145 mmol/L   Potassium 4.8 3.5 - 5.1 mmol/L   Chloride 97 (L) 98 - 111 mmol/L   CO2 32 22 - 32 mmol/L   Glucose, Bld 163 (H) 70 - 99 mg/dL   BUN 17 8 - 23 mg/dL   Creatinine, Ser 1.34 (H) 0.44 - 1.00 mg/dL   Calcium 9.0 8.9 - 10.3 mg/dL   GFR, Estimated 41 (L) >60 mL/min   Anion gap 8 5 - 15  CBC   Collection Time: 12/14/21  1:07 AM  Result Value Ref Range   WBC 9.5 4.0 - 10.5 K/uL   RBC 2.81 (L) 3.87 - 5.11 MIL/uL   Hemoglobin 7.9 (L) 12.0 - 15.0 g/dL   HCT 26.9 (L) 36.0 - 46.0 %   MCV 95.7 80.0 - 100.0 fL   MCH 28.1 26.0 - 34.0 pg   MCHC 29.4 (L) 30.0 - 36.0 g/dL   RDW 16.6 (H) 11.5 -  15.5 %   Platelets 188 150 - 400 K/uL   nRBC 0.0 0.0 - 0.2 %  Glucose, capillary   Collection Time: 12/14/21 11:39 AM  Result Value Ref Range   Glucose-Capillary 182 (H) 70 - 99 mg/dL  Results for orders placed or performed in visit on 12/09/21 (from the past 168 hour(s))  CMP ([place] only)   Collection Time: 12/09/21 10:50 AM  Result Value Ref Range   Sodium 137 135 - 145 mmol/L   Potassium 3.7 3.5 - 5.1 mmol/L   Chloride 91 (L) 98 - 111 mmol/L   CO2 42 (H) 22 - 32 mmol/L   Glucose, Bld 107 (H) 70 - 99 mg/dL   BUN 23 8 - 23 mg/dL   Creatinine 0.87 0.44 - 1.00 mg/dL   Calcium 10.3 8.9 - 10.3 mg/dL   Total Protein 6.8 6.5 - 8.1 g/dL   Albumin 3.3 (L) 3.5 - 5.0 g/dL   AST 12 (L) 15 - 41 U/L   ALT 12 0 - 44 U/L   Alkaline Phosphatase 88 38 - 126 U/L   Total Bilirubin 0.4 0.3 - 1.2 mg/dL   GFR, Estimated >60 >60 mL/min   Anion gap 4 (L) 5 - 15  CBC with Differential (Can[facility] Center Only)   Collection Time: 12/09/21 10:50 AM  Result Value Ref Range   WBC Count 11.4 (H) 4.0 - 10.5 K/uL   RBC 3.45 (L) 3.87 - 5.11 MIL/uL   Hemoglobin 9.7 (L) 12.0 - 15.0 g/dL   HCT 33.6 (L) 36.0 - 46.0 %   MCV 97.4 80.0 - 100.0 fL   MCH 28.1 26.0 - 34.0 pg   MCHC 28.9 (L) 30.0 - 36.0 g/dL   RDW 18.1 (H) 11.5 - 15.5 %   Platelet Count 224 150 - 400 K/uL   nRBC 0.0 0.0 - 0.2 %   Neutrophils Relative % 60 %   Neutro Abs 6.9 1.7 - 7.7 K/uL   Lymphocytes Relative 29 %   Lymphs Abs 3.4 0.7 - 4.0 K/uL   Monocytes Relative 8 %   Monocytes Absolute 0.9 0.1 - 1.0 K/uL   Eosinophils Relative 2 %   Eosinophils Absolute 0.2 0.0 - 0.5 K/uL   Basophils Relative 0 %   Basophils Absolute 0.0 0.0 - 0.1 K/uL   Immature Granulocytes 1 %   Abs Immature Granulocytes 0.09 (H) 0.00 - 0.07 K/uL     Treatments: IV hydration, antibiotics: Ancef, analgesia: acetaminophen,  oxycodone, tramadol, morphine, and Celebrex, cardiac meds: metoprolol, anticoagulation: ASA, therapies: PT and OT, and surgery: As  above  Discharge Exam: General: Patient resting comfortably in bed, no acute distress Respiratory: No increased work of breathing at rest Left lower extremity: Dressing clean, dry, intact.  Mildly tender over the proximal tibia as expected.  Tolerates gentle knee motion.  Ankle dorsiflexion/plantarflexion intact.  Toes warm and well-perfused.  Compartments soft and compressible  Disposition: Discharge disposition: 01-Home or Self Care        Allergies as of 12/14/2021       Reactions   Amlodipine Besylate Swelling   Edema   Codeine Nausea And Vomiting   Oxycodone-acetaminophen Nausea And Vomiting        Medication List     TAKE these medications    acetaminophen 325 MG tablet Commonly known as: TYLENOL Take 650 mg by mouth every 6 (six) hours as needed for mild pain.   ALPRAZolam 0.5 MG tablet Commonly known as: XANAX Take 0.5 mg by mouth 2 (two) times daily.   aspirin 325 MG tablet Take 1 tablet (325 mg total) by mouth in the morning and at bedtime.   atorvastatin 20 MG tablet Commonly known as: LIPITOR Take 20 mg by mouth at bedtime.   buPROPion 300 MG 24 hr tablet Commonly known as: WELLBUTRIN XL Take 300 mg by mouth every morning.   busPIRone 5 MG tablet Commonly known as: BUSPAR Take 1 tablet (5 mg total) by mouth 3 (three) times daily. What changed: when to take this   COLACE PO Take 50 mg by mouth 2 (two) times daily.   donepezil 10 MG tablet Commonly known as: ARICEPT Take 10 mg by mouth at bedtime.   ferrous sulfate 325 (65 FE) MG tablet Take 325 mg by mouth 2 (two) times daily with a meal.   Fluticasone-Umeclidin-Vilant 100-62.5-25 MCG/INH Aepb Inhale 1 puff into the lungs daily. Trelegy   furosemide 20 MG tablet Commonly known as: LASIX Take 1 tablet (20 mg total) by mouth every other day. What changed:  how much to take when to take this   levothyroxine 137 MCG tablet Commonly known as: SYNTHROID Take 137 mcg by mouth daily  before breakfast.   losartan 50 MG tablet Commonly known as: COZAAR Take 1 tablet (50 mg total) by mouth daily. What changed: when to take this   metoprolol succinate 50 MG 24 hr tablet Commonly known as: TOPROL-XL Take 50 mg by mouth daily. Take with or immediately following a meal.   mirtazapine 30 MG tablet Commonly known as: REMERON Take 30 mg by mouth at bedtime.   ondansetron 4 MG tablet Commonly known as: Zofran Take 1 tablet (4 mg total) by mouth every 8 (eight) hours as needed for nausea or vomiting. What changed: how much to take   oxyCODONE 5 MG immediate release tablet Commonly known as: Oxy IR/ROXICODONE Take 0.5-1 tablets (2.5-5 mg total) by mouth every 4 (four) hours as needed for severe pain.   pantoprazole 40 MG tablet Commonly known as: PROTONIX Take 1 tablet (40 mg total) by mouth daily. What changed: when to take this   polyethylene glycol 17 g packet Commonly known as: MIRALAX / GLYCOLAX Take 17 g by mouth daily as needed for moderate constipation. What changed: when to take this   predniSONE 20 MG tablet Commonly known as: DELTASONE Take 20 mg by mouth daily.   Procto-Med HC 2.5 % rectal cream Generic drug: hydrocortisone Apply 1 application topically daily  as needed for hemorrhoids.   traMADol 50 MG tablet Commonly known as: ULTRAM Take 50-100 mg by mouth every 6 (six) hours as needed for severe pain or moderate pain.   Ventolin HFA 108 (90 Base) MCG/ACT inhaler Generic drug: albuterol INHALE 2 PUFFS INTO THE LUNGS EVERY 4 HOURS AS NEEDED FOR WHEEZING ORSHORTNESS OF BREATH What changed: See the new instructions.   vitamin C 500 MG tablet Commonly known as: ASCORBIC ACID Take 500 mg by mouth daily.   Vitamin D (Ergocalciferol) 1.25 MG (50000 UNIT) Caps capsule Commonly known as: DRISDOL Take 1 capsule (50,000 Units total) by mouth every 7 (seven) days.        Follow-up Information     Haddix, Thomasene Lot, MD. Schedule an appointment as  soon as possible for a visit in 2 week(s).   Specialty: Orthopedic Surgery Why: for wound check and repeat x-rays Contact information: [address] [place] [address] [phone]                 Discharge Instructions and Plan: Patient will be discharged to home. Will be discharged on Aspirin for DVT prophylaxis. Patient has all the necessary DME for discharge. Patient will follow up with Dr. Doreatha Martin in 2 weeks for repeat x-rays and wound check.  We will reach out to patient once bone biopsy results are available.  Signed:  Gwinda Passe, PA-C ?([phone]? (phone) 12/14/2021, 12:01 PM  [facility] [place] [address] [phone] (F)

## 2021-12-19 LAB — AEROBIC/ANAEROBIC CULTURE W GRAM STAIN (SURGICAL/DEEP WOUND): Gram Stain: NONE SEEN

## 2021-12-20 NOTE — Progress Notes (Signed)
Radiation Oncology         ([phone]) [phone] ________________________________  Outpatient Follow Up - Conducted via telephone due to current COVID-19 concerns for limiting patient exposure  I spoke with the patient to conduct this consult visit via telephone to spare the patient unnecessary potential exposure in the healthcare setting during the current COVID-19 pandemic. The patient was notified in advance and was offered a [place] meeting to allow for face to face communication but unfortunately reported that they did not have the appropriate resources/technology to support such a visit and instead preferred to proceed with a telephone visit.   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 female originally seen at the request of Dr. Kipp Brood with a history of COPD, oxygen dependant at 2L [place] for several years. She was previously a patient of Dr. Lake Bells, but following pneumonia that required admission at [facility] about 2 years ago when she started needing O2, she continued with Dr. Alcide Clever.  CT imaging in July 2020 revealed a 12 x 8 mm left upper lobe nodule and a nodule in the RML measuring 13 x 7 mm. She underwent a PET scan on 06/05/2019 revealed hypermetabolic change with an SUV of 8.5 in the LUL and this measured 12 mm, and in the RML the nodule was 13 mm, and had an SUV of 1.4. She did not have any hypermetabolic adenopathy. A left adrenal adenoma was noted as well and was not hypermetabolic. She underwent navigational bronchoscopy on 06/25/2019 with Dr. Kipp Brood and a biopsy and brushing of the LUL revealed squamous cell carcinoma. The RML lesion was also sampled, and atypical cells were  noted but not diagnostic of cancer. She was discussed in [facility] conference and she did not appear to be a good candidate for surgical resection, and rather that she would benefit from SBRT to the LUL and probably follow the RML with serial imaging prior to intervening with treatment.  She did proceed with stereotactic body radiotherapy which she completed on 08/06/2019 to the left upper lobe.  Her RML nodule has wax and waned over time, but a change in a separate LUL nodule progressed and she was offered another course of SBRT as this was a separate nodule. She received this treatment in August 2021.   Imaging last fall showed increase in the size of the right middle lobe nodule, and Dr. Lisbeth Renshaw recommended proceeding with a PET scan. This was performed on 09/02/2021 and showed a 2.4 cm hypermetabolic nodule in the posterior right middle lobe consistent with a new primary lung cancer.  A 6 mm pretracheal mediastinal node showed mild hypermetabolism but the blood pool was 2.7 and the uptake of the pretracheal node was 3.4. She also had hypermetabolic uptake in the left parotid gland was was referred to ENT but has not been seen. She went on to receive SBRT to the RML nodule which was completed in December 2022. It was felt her pretracheal node was going to be followed expectantly. Plans have been to resume surveillance. She had post treatment imaging on 11/30/21 showed improvement in her treated nodule in the RML, and persistent but continued prominence in the pretracheal  node. She had also complained of pain in her left knee and a CT with orthopedics of the left knee showed a tibial plateau fracture felt to be concerning for pathologic fracture. We encouraged her to proceed with surgery and she went to the OR with Dr. Doreatha Martin and was taken to the OR on 12/13/21  and specimens were received and negative for malignancy. She has fungal cultures pending, and other cultures showed MRSA and she's seeing ID this week. She met  with Dr. Julien Nordmann prior to the pathology resulting, and he has recommended a Bone Scan for further clarity. This was performed yesterday but only uptake was seen in the distal femur and proximal left tibia. She's seen today to consider next steps in her care.    On review of systems, the patient reports that she is doing well since surgery. Pain is manageable at this time. She reports again she never was seen by ENT. She is open to seeing them. I'll ask our staff to coordinate with her. She denies any new difficulties with breathing. No other complaints are verbalized.   Prior Radiation:  10/05/2021 through 10/12/2021 SBRT Treatment Site Technique Total Dose (Gy) Dose per Fx (Gy) Completed Fx Beam Energies  RML Lung IMRT 54/54 18 3/3 6XFFF    06/02/20-06/09/20 SBRT Treatment: The separate LUL target different from 2020 treatment was treated to 54 Gy in 3 fractions.  07/30/2019-08/06/2019 SBRT Treatment: The LUL target was treated to 54 Gy in 3 fractions.   Past Medical History:  Past Medical History:  Diagnosis Date   Anemia    blood transfusion November 2022   Anxiety    Asthma    Basal cell carcinoma    lung cancer.  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 Uses 2 L    Foot swelling    left- has shown to Drs.   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   Hypercalcemia    Hypertension    Hypothyroidism    Mixed hyperlipidemia    Pneumonia    Pulmonary nodule    Sleep apnea    pt uses a cpap nightly   Thyroid neoplasm    Urinary incontinence    Wears dentures    Wears glasses     Past Surgical History: Past Surgical History:  Procedure Laterality Date   BIOPSY  02/17/2021   Procedure: BIOPSY;  Surgeon: Otis Brace, MD;  Location: [facility];  Service:  Gastroenterology;;   BONE BIOPSY Left 12/13/2021   Procedure: BONE BIOPSY;  Surgeon: Shona Needles, MD;  Location: [facility];  Service: Orthopedics;  Laterality: Left;   COLONOSCOPY     DENTAL SURGERY     ELBOW ARTHROPLASTY Right 2020   ESOPHAGOGASTRODUODENOSCOPY (EGD) WITH PROPOFOL N/A 02/17/2021   Procedure: ESOPHAGOGASTRODUODENOSCOPY (EGD) WITH PROPOFOL;  Surgeon: Otis Brace, MD;  Location: [facility];  Service: Gastroenterology;  Laterality: N/A;   EYE SURGERY Right    cataract surgery   FOOT SURGERY  30 YRS AGO   BILATERAL   HARDWARE REMOVAL Right 10/02/2020   Procedure: HARDWARE REMOVAL RIGHT ELBOW;  Surgeon: Shona Needles, MD;  Location: [facility];  Service: Orthopedics;  Laterality: Right;  I & D EXTREMITY Right 01/13/2021   Procedure: IRRIGATION AND DEBRIDEMENT RIGHT ELBOW;  Surgeon: Shona Needles, MD;  Location: [facility];  Service: Orthopedics;  Laterality: Right;   ORIF ELBOW FRACTURE Right 12/06/2017   Procedure: OPEN REDUCTION INTERNAL FIXATION (ORIF) ELBOW/OLECRANON FRACTURE;  Surgeon: Shona Needles, MD;  Location: [facility];  Service: Orthopedics;  Laterality: Right;   ORIF TIBIA PLATEAU Left 12/13/2021   Procedure: OPEN REDUCTION INTERNAL FIXATION (ORIF) TIBIAL PLATEAU;  Surgeon: Shona Needles, MD;  Location: [facility];  Service: Orthopedics;  Laterality: Left;   SKIN CANCER EXCISION     THYROIDECTOMY N/A 07/31/2014   Procedure: TOTAL THYROIDECTOMY;  Surgeon: Armandina Gemma, MD;  Location: [facility];  Service: General;  Laterality: N/A;   VAGINAL HYSTERECTOMY     VIDEO BRONCHOSCOPY WITH ENDOBRONCHIAL NAVIGATION Bilateral 06/25/2019   Procedure: VIDEO BRONCHOSCOPY WITH ENDOBRONCHIAL NAVIGATION;  Surgeon: Lajuana Matte, MD;  Location: [facility];  Service: Thoracic;  Laterality: Bilateral;    Social History:  Social History   Socioeconomic History   Marital status: Widowed    Spouse name: Not on file   Number of children: 2   Years of education: Not on file   Highest education  level: Not on file  Occupational History   Occupation: [employer]  Tobacco Use   Smoking status: Former    Packs/day: 1.00    Years: 30.00    Pack years: 30.00    Types: Cigarettes    Quit date: 02/24/2015    Years since quitting: 6.8   Smokeless tobacco: Never  Vaping Use   Vaping Use: Former  Substance and Sexual Activity   Alcohol use: Yes    Alcohol/week: 0.0 standard drinks    Comment: very seldom   Drug use: Never   Sexual activity: Not Currently  Other Topics Concern   Not on file  Social History Narrative   Not on file   Social Determinants of Health   Financial Resource Strain: Not on file  Food Insecurity: Not on file  Transportation Needs: Not on file  Physical Activity: Not on file  Stress: Not on file  Social Connections: Not on file  Intimate Partner Violence: Not on file  The patient is widowed and lives in [place] in [place].  Family History: Family History  Problem Relation Age of Onset   Coronary artery disease Father    Hypertension Father    Asthma Father    Allergies Father    Hypertension Mother    Lung cancer Mother      Medications: Current Outpatient Medications  Medication Sig Dispense Refill   acetaminophen (TYLENOL) 325 MG tablet Take 650 mg by mouth every 6 (six) hours as needed for mild pain.     ALPRAZolam (XANAX) 0.5 MG tablet Take 0.5 mg by mouth 2 (two) times daily.     aspirin 325 MG tablet Take 1 tablet (325 mg total) by mouth in the morning and at bedtime. 60 tablet 0   atorvastatin (LIPITOR) 20 MG tablet Take 20 mg by mouth at bedtime.      buPROPion (WELLBUTRIN XL) 300 MG 24 hr tablet Take 300 mg by mouth every morning.     busPIRone (BUSPAR) 5 MG tablet Take 1 tablet (5 mg total) by mouth 3 (three) times daily. (Patient taking differently: Take 5 mg by mouth 2 (two) times daily.) 30 tablet 3   Docusate Sodium (COLACE PO) Take 50 mg by mouth 2 (two) times daily.  donepezil (ARICEPT) 10 MG tablet Take 10  mg by mouth at bedtime.     ferrous sulfate 325 (65 FE) MG tablet Take 325 mg by mouth 2 (two) times daily with a meal.     Fluticasone-Umeclidin-Vilant 100-62.5-25 MCG/INH AEPB Inhale 1 puff into the lungs daily. Trelegy     furosemide (LASIX) 20 MG tablet Take 1 tablet (20 mg total) by mouth every other day. (Patient taking differently: Take 40 mg by mouth daily.) 30 tablet 3   levothyroxine (SYNTHROID) 137 MCG tablet Take 137 mcg by mouth daily before breakfast.     losartan (COZAAR) 50 MG tablet Take 1 tablet (50 mg total) by mouth daily. (Patient taking differently: Take 50 mg by mouth at bedtime.) 30 tablet 3   metoprolol succinate (TOPROL-XL) 50 MG 24 hr tablet Take 50 mg by mouth daily. Take with or immediately following a meal.     mirtazapine (REMERON) 30 MG tablet Take 30 mg by mouth at bedtime.     ondansetron (ZOFRAN) 4 MG tablet Take 1 tablet (4 mg total) by mouth every 8 (eight) hours as needed for nausea or vomiting. (Patient taking differently: Take 8 mg by mouth every 8 (eight) hours as needed for nausea or vomiting.) 20 tablet 0   oxyCODONE (OXY IR/ROXICODONE) 5 MG immediate release tablet Take 0.5-1 tablets (2.5-5 mg total) by mouth every 4 (four) hours as needed for severe pain. 30 tablet 0   pantoprazole (PROTONIX) 40 MG tablet Take 1 tablet (40 mg total) by mouth daily. (Patient taking differently: Take 40 mg by mouth 2 (two) times daily.) 30 tablet 3   polyethylene glycol (MIRALAX / GLYCOLAX) 17 g packet Take 17 g by mouth daily as needed for moderate constipation. (Patient taking differently: Take 17 g by mouth daily.) 14 each 0   predniSONE (DELTASONE) 20 MG tablet Take 20 mg by mouth daily.     PROCTO-MED HC 2.5 % rectal cream Apply 1 application topically daily as needed for hemorrhoids.     traMADol (ULTRAM) 50 MG tablet Take 50-100 mg by mouth every 6 (six) hours as needed for severe pain or moderate pain.     VENTOLIN HFA 108 (90 Base) MCG/ACT inhaler INHALE 2 PUFFS INTO  THE LUNGS EVERY 4 HOURS AS NEEDED FOR WHEEZING ORSHORTNESS OF BREATH (Patient taking differently: Inhale 2 puffs into the lungs every 4 (four) hours as needed for wheezing or shortness of breath.) 8 g 0   vitamin C (ASCORBIC ACID) 500 MG tablet Take 500 mg by mouth daily.     Vitamin D, Ergocalciferol, (DRISDOL) 1.25 MG (50000 UNIT) CAPS capsule Take 1 capsule (50,000 Units total) by mouth every 7 (seven) days. 5 capsule 0   No current facility-administered medications for this visit.     Allergies:  Allergies  Allergen Reactions   Amlodipine Besylate Swelling    Edema   Codeine Nausea And Vomiting   Oxycodone-Acetaminophen Nausea And Vomiting   Physical Exam: Unable to assess due to encounter type.  Impression/Plan: 1.         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  We appreciated Dr. Worthy Flank imput of her care given the quite complex findings radiographically that were thought to be related to malignancy. We will see her back as needed as she is now under his care.  She does not appear to have a need for radiotherapy to the knee at this time. 2. Left knee fracture with MRSA osteomyelitis. We will follow along with her course and appreciate Dr. Doreatha Martin and Dr. Lucianne Lei Dam's input. 3. Left parotid lesion on PET. I recommended she meet with ENT regarding this and we have referred her months ago. I'll ask our scheduler to follow up with ENT. She will get a call as well for evaluation. 4. O2 dependant COPD. The patient will continue under the care of  Dr. Boris Lown in [place].       Given current concerns for patient exposure during the COVID-19 pandemic, this encounter was conducted via telephone.  The patient has provided two factor identification and has given verbal consent for this type of encounter and has been advised to  only accept a meeting of this type in a secure network environment. The time spent during this encounter was 35 minutes including preparation, discussion, and coordination of the patient's care.  Hayden Pedro  and Marcelyn Ditty.  During the encounter,   Hayden Pedro was located at [facility] Radiation Oncology Department.  Lonzo Candy Hosterman was located at home.       Carola Rhine, PAC

## 2021-12-21 ENCOUNTER — Other Ambulatory Visit: Payer: Self-pay

## 2021-12-21 ENCOUNTER — Ambulatory Visit (HOSPITAL_COMMUNITY)
Admission: RE | Admit: 2021-12-21 | Discharge: 2021-12-21 | Disposition: A | Discharge status: Home / Self Care | Payer: Medicare Other | Source: Ambulatory Visit | Attending: Internal Medicine | Admitting: Internal Medicine

## 2021-12-21 ENCOUNTER — Encounter (HOSPITAL_COMMUNITY)
Admission: RE | Admit: 2021-12-21 | Discharge: 2021-12-21 | Disposition: A | Discharge status: Home / Self Care | Payer: Medicare Other | Source: Ambulatory Visit | Attending: Internal Medicine | Admitting: Internal Medicine

## 2021-12-21 DIAGNOSIS — C799 Secondary malignant neoplasm of unspecified site: Secondary | ICD-10-CM | POA: Diagnosis not present

## 2021-12-21 DIAGNOSIS — S82102A Unspecified fracture of upper end of left tibia, initial encounter for closed fracture: Secondary | ICD-10-CM | POA: Diagnosis not present

## 2021-12-21 DIAGNOSIS — C3490 Malignant neoplasm of unspecified part of unspecified bronchus or lung: Secondary | ICD-10-CM | POA: Diagnosis not present

## 2021-12-21 IMAGING — NM NM BONE WHOLE BODY
2 series · 2 of 2 positions shown · non-contrast
Comparison: No prior bone scans for comparison.

CLINICAL DATA: Non-small-cell lung cancer, staging. Recent left
tibial plateau fracture with CT showing multiple lucent lesions in
the distal femur and proximal tibia suspected metastases with
pathologic fracture.

EXAM:
NUCLEAR MEDICINE WHOLE BODY BONE SCAN
TECHNIQUE: Whole body anterior and posterior images were obtained approximately
3 hours after intravenous injection of radiopharmaceutical.
RADIOPHARMACEUTICALS:  19.9 mCi [YE] MDP IV

[Series 1: wbr_bone_40 whole body · 2.66mm/px · 1 of 1 slices shown (1 of 2)]
[im 1/1]
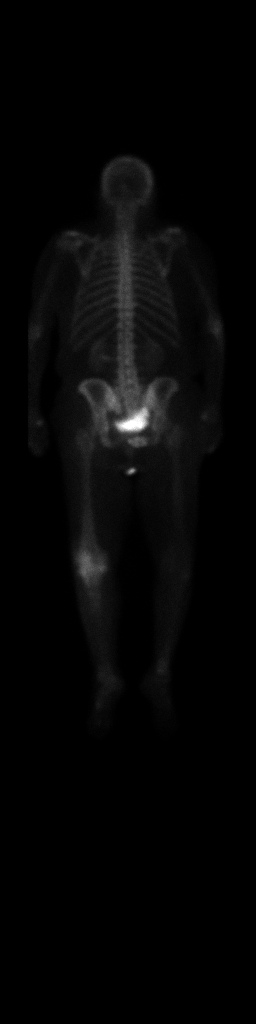

[Series 1: wbr_bone_40 whole body · 2.66mm/px · 1 of 1 slices shown (2 of 2)]
[im 1/1]
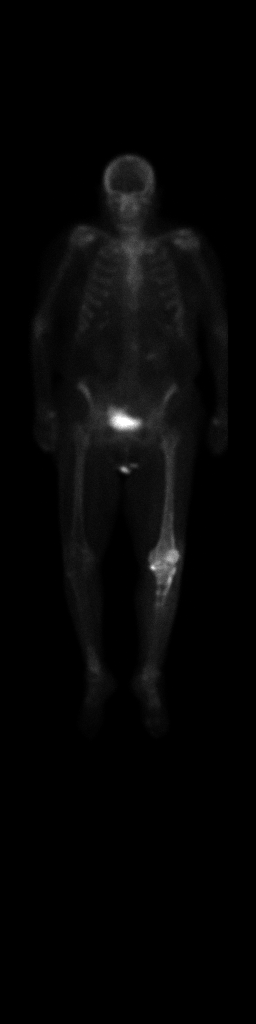

[2 of 2 positions shown; findings below may reference images not displayed]

Comparison is made
with the CT scan left knee [DATE] showing acute depressed
comminuted left tibial lateral plateau fracture and multiple lucent
lesions in the distal femur and proximal tibia, and the subsequent
AP Lat postoperative left knee demonstrating interval left tibial
ORIF plating on [DATE].
FINDINGS: There is moderate increased activity in the proximal left tibia
corresponding to the recent fracture and placement of proximal left
tibial ORIF plating hardware.

There is additional, milder increased activity in the distal femoral
diametaphysis on the left which could correspond to multiple lytic
lesions noted in the area on CT, but the uptake is nonfocal.

Elsewhere there is no significant increased uptake in the axial and
appendicular skeleton. Mild activity consistent with degenerative
arthrosis is noted at the AC joints, right knee and both ankles.
Soft tissue and renal activity are unremarkable.
IMPRESSION: 1. Increased proximal left tibial activity corresponding to the
recent fracture and ORIF plating, and possibly in part due to lucent
lesions noted in the proximal tibia.
2. Increased, relatively mild activity in the distal left femur,
nonfocal and could correspond to the multiple lucent lesions felt to
represent metastases on CT.
3. No other significant osseous uptake abnormality.

## 2021-12-21 MED ORDER — TECHNETIUM TC 99M MEDRONATE IV KIT
20.0000 | PACK | Freq: Once | INTRAVENOUS | Status: AC | PRN
Start: 2021-12-21 — End: 2021-12-21
  Administered 2021-12-21: 19.9 via INTRAVENOUS

## 2021-12-23 ENCOUNTER — Ambulatory Visit
Admission: RE | Admit: 2021-12-23 | Discharge: 2021-12-23 | Disposition: A | Discharge status: Home / Self Care | Payer: Medicare Other | Source: Ambulatory Visit | Attending: Radiation Oncology | Admitting: Radiation Oncology

## 2021-12-23 ENCOUNTER — Telehealth: Payer: Self-pay | Admitting: *Deleted

## 2021-12-23 ENCOUNTER — Other Ambulatory Visit: Payer: Self-pay

## 2021-12-23 DIAGNOSIS — C342 Malignant neoplasm of middle lobe, bronchus or lung: Secondary | ICD-10-CM

## 2021-12-23 DIAGNOSIS — C3412 Malignant neoplasm of upper lobe, left bronchus or lung: Secondary | ICD-10-CM

## 2021-12-23 NOTE — Telephone Encounter (Signed)
CALLED PATIENT'S DAUGHTER- Holly [name] AND INFORMED OF APPT. WITH DR. BATES ON 01-04-22 - ARRIVAL TIME- 2:15 PM , PATIENT TO BRING INSURANCE CARDS AND PHOTO ID, ADDRESS- [place] PHONE NUMBER- [phone], SPOKE WITH PATIENT'S DAUGHTER Holly [name] AND SHE  IS AWARE OF THESE APPTS. ?

## 2021-12-23 NOTE — Progress Notes (Signed)
Patient states that she forgot her appointment was today with Shona Simpson and that she would like to reschedule.Rn notified Bryson Ha and scheduling. ?

## 2021-12-24 ENCOUNTER — Other Ambulatory Visit: Payer: Self-pay

## 2021-12-24 ENCOUNTER — Ambulatory Visit (INDEPENDENT_AMBULATORY_CARE_PROVIDER_SITE_OTHER): Payer: Medicare Other | Admitting: Infectious Disease

## 2021-12-24 ENCOUNTER — Encounter: Payer: Self-pay | Admitting: Infectious Disease

## 2021-12-24 ENCOUNTER — Telehealth: Payer: Self-pay

## 2021-12-24 VITALS — BP 139/73 | HR 75 | Temp 98.3°F | Resp 16 | Ht 60.0 in | Wt 137.0 lb

## 2021-12-24 DIAGNOSIS — T847XXA Infection and inflammatory reaction due to other internal orthopedic prosthetic devices, implants and grafts, initial encounter: Secondary | ICD-10-CM | POA: Diagnosis not present

## 2021-12-24 DIAGNOSIS — B9562 Methicillin resistant Staphylococcus aureus infection as the cause of diseases classified elsewhere: Secondary | ICD-10-CM

## 2021-12-24 DIAGNOSIS — M869 Osteomyelitis, unspecified: Secondary | ICD-10-CM | POA: Insufficient documentation

## 2021-12-24 DIAGNOSIS — J9611 Chronic respiratory failure with hypoxia: Secondary | ICD-10-CM | POA: Diagnosis not present

## 2021-12-24 DIAGNOSIS — I10 Essential (primary) hypertension: Secondary | ICD-10-CM | POA: Diagnosis not present

## 2021-12-24 DIAGNOSIS — J449 Chronic obstructive pulmonary disease, unspecified: Secondary | ICD-10-CM

## 2021-12-24 DIAGNOSIS — A4902 Methicillin resistant Staphylococcus aureus infection, unspecified site: Secondary | ICD-10-CM

## 2021-12-24 DIAGNOSIS — M86362 Chronic multifocal osteomyelitis, left tibia and fibula: Secondary | ICD-10-CM

## 2021-12-24 HISTORY — DX: Osteomyelitis, unspecified: M86.9

## 2021-12-24 HISTORY — DX: Infection and inflammatory reaction due to other internal orthopedic prosthetic devices, implants and grafts, initial encounter: T84.7XXA

## 2021-12-24 HISTORY — DX: Methicillin resistant Staphylococcus aureus infection, unspecified site: A49.02

## 2021-12-24 MED ORDER — DOXYCYCLINE HYCLATE 100 MG PO TABS: 100.0000 mg | ORAL_TABLET | Freq: Two times a day (BID) | ORAL | 11 refills | Status: DC

## 2021-12-24 NOTE — Telephone Encounter (Signed)
Scheduled @ IR 12/29/21 11:30 AM. Patient notified of appt date and time.  ?Spoke to son who noted the time and location of IR appt. ? ?Patient will be on 500 mg Daptomycin and will need first dose at Short Stay. ? ?Notified [facility] for Authorization.  ?Awaiting Auth then need to fax request to Short Stay for first dose  ? ?OPAT plans: ?Daptomycin 500 mg  ?End Date: 6 weeks after start date.( Scheduled for 12/29/2021) ? ?Triage, patient, pharmacy, and [facility] have all been notified. ? ?NEXT STEP: ? ?Fax form to Short Stay after Josem Kaufmann is approved by [facility].  ? ? ? ? ? ? ? ? ?0 ?Pull PICC: OK after last dose.  ?

## 2021-12-24 NOTE — Progress Notes (Signed)
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  She was brought to the hospital by Dr. Doreatha Martin who performed surgery and performed open reduction internal fixation of the left tibial plateau fracture and a biopsy of the left proximal tibia.  Biopsy came back negative for malignancy but multiple cultures that were also taken of come back positive for methicillin resistant Staphylococcus aureus.  She has been placed on doxycycline 100 mg twice daily by Dr. Doreatha Martin and referred to [place] for evaluation of her osteomyelitis complicated by hardware.  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 Doreatha Martin and further additional surgery in March 2022.  To be tolerating the doxycycline without difficulty this point in time.     Past Medical History:  Diagnosis Date   Anemia    blood transfusion November 2022   Anxiety    Asthma    Basal cell carcinoma    lung cancer.  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 Uses 2 L    Foot swelling    left- has shown to Drs.   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   Hypercalcemia    Hypertension    Hypothyroidism    Mixed hyperlipidemia    MRSA infection 12/24/2021   Osteomyelitis of left tibia ([place]) 12/24/2021   Pneumonia    Pulmonary nodule    Sleep apnea    pt uses a cpap nightly   Thyroid neoplasm    Urinary incontinence    Wears dentures    Wears glasses     Past Surgical History:  Procedure Laterality Date   BIOPSY  02/17/2021   Procedure: BIOPSY;  Surgeon: Otis Brace, MD;  Location: [facility];  Service: Gastroenterology;;   BONE BIOPSY Left 12/13/2021   Procedure: BONE BIOPSY;  Surgeon: Shona Needles, MD;  Location: [facility];  Service: Orthopedics;  Laterality: Left;   COLONOSCOPY     DENTAL SURGERY     ELBOW ARTHROPLASTY Right 2020   ESOPHAGOGASTRODUODENOSCOPY (EGD) WITH PROPOFOL N/A 02/17/2021   Procedure: ESOPHAGOGASTRODUODENOSCOPY (EGD) WITH PROPOFOL;  Surgeon: Otis Brace, MD;  Location: [facility];  Service: Gastroenterology;  Laterality: N/A;   EYE SURGERY Right    cataract surgery   FOOT SURGERY  30 YRS AGO   BILATERAL   HARDWARE REMOVAL Right 10/02/2020   Procedure: HARDWARE REMOVAL RIGHT ELBOW;  Surgeon: Shona Needles, MD;  Location: [facility];  Service: Orthopedics;  Laterality: Right;   I & D EXTREMITY Right 01/13/2021   Procedure: IRRIGATION AND DEBRIDEMENT RIGHT ELBOW;  Surgeon: Shona Needles, MD;  Location: [facility];  Service: Orthopedics;  Laterality: Right;   ORIF ELBOW FRACTURE Right 12/06/2017   Procedure: OPEN REDUCTION INTERNAL FIXATION (ORIF) ELBOW/OLECRANON FRACTURE;  Surgeon: Shona Needles, MD;  Location: [facility];  Service: Orthopedics;  Laterality: Right;   ORIF TIBIA PLATEAU Left 12/13/2021   Procedure: OPEN REDUCTION INTERNAL FIXATION (ORIF) TIBIAL PLATEAU;  Surgeon: Shona Needles, MD;  Location: [facility];  Service: Orthopedics;  Laterality: Left;   SKIN CANCER EXCISION      THYROIDECTOMY N/A 07/31/2014   Procedure: TOTAL THYROIDECTOMY;  Surgeon: Armandina Gemma, MD;  Location: [facility];  Service: General;  Laterality: N/A;   VAGINAL HYSTERECTOMY     VIDEO BRONCHOSCOPY WITH ENDOBRONCHIAL NAVIGATION Bilateral 06/25/2019   Procedure: VIDEO BRONCHOSCOPY WITH ENDOBRONCHIAL NAVIGATION;  Surgeon: Lajuana Matte, MD;  Location: [facility];  Service: Thoracic;  Laterality: Bilateral;    Family History  Problem Relation Age of Onset   Coronary artery disease Father    Hypertension Father    Asthma Father    Allergies Father    Hypertension Mother    Lung cancer Mother       Social History   Socioeconomic History   Marital status: Widowed    Spouse name: Not on file   Number of children: 2   Years of education: Not on file   Highest education level: Not on file  Occupational History   Occupation: [employer]  Tobacco Use   Smoking status: Former    Packs/day: 1.00    Years: 30.00    Pack years: 30.00    Types: Cigarettes    Quit date: 02/24/2015    Years since quitting: 6.8   Smokeless tobacco: Never  Vaping Use   Vaping Use: Former  Substance and Sexual Activity   Alcohol use: Yes    Alcohol/week: 0.0 standard drinks    Comment: very seldom   Drug use: Never   Sexual activity: Not Currently  Other Topics Concern   Not on file  Social History Narrative   Not on file   Social Determinants of Health   Financial Resource Strain: Not on file  Food Insecurity: Not on file  Transportation Needs: Not on file  Physical Activity: Not on file  Stress: Not on file  Social Connections: Not on file    Allergies  Allergen Reactions   Amlodipine Besylate Swelling    Edema   Codeine Nausea And Vomiting   Oxycodone-Acetaminophen Nausea And Vomiting     Current Outpatient Medications:    acetaminophen (TYLENOL) 325 MG tablet, Take 650 mg by mouth every 6 (six) hours as needed for mild pain., Disp: , Rfl:    ALPRAZolam (XANAX) 0.5 MG tablet, Take  0.5 mg by mouth 2 (two) times daily., Disp: , Rfl:    aspirin 325 MG tablet, Take 1 tablet (325 mg total) by mouth in the morning and at bedtime., Disp: 60 tablet, Rfl: 0   atorvastatin (LIPITOR) 20 MG tablet, Take 20 mg by mouth at bedtime. , Disp: , Rfl:    buPROPion (WELLBUTRIN XL) 300 MG 24 hr tablet, Take 300 mg by mouth every morning., Disp: , Rfl:    busPIRone (BUSPAR) 5 MG tablet, Take 1 tablet (5 mg total) by mouth 3 (three) times daily. (Patient taking differently: Take 5 mg by  mouth 2 (two) times daily.), Disp: 30 tablet, Rfl: 3   Docusate Sodium (COLACE PO), Take 50 mg by mouth 2 (two) times daily., Disp: , Rfl:    donepezil (ARICEPT) 10 MG tablet, Take 10 mg by mouth at bedtime., Disp: , Rfl:    doxycycline (VIBRA-TABS) 100 MG tablet, Take 1 tablet (100 mg total) by mouth 2 (two) times daily. To be taken until the IV antibiotics are started and to be resumed when they are stopped, Disp: 60 tablet, Rfl: 11   ferrous sulfate 325 (65 FE) MG tablet, Take 325 mg by mouth 2 (two) times daily with a meal., Disp: , Rfl:    Fluticasone-Umeclidin-Vilant 100-62.5-25 MCG/INH AEPB, Inhale 1 puff into the lungs daily. Trelegy, Disp: , Rfl:    furosemide (LASIX) 20 MG tablet, Take 1 tablet (20 mg total) by mouth every other day. (Patient taking differently: Take 40 mg by mouth daily.), Disp: 30 tablet, Rfl: 3   levothyroxine (SYNTHROID) 137 MCG tablet, Take 137 mcg by mouth daily before breakfast., Disp: , Rfl:    losartan (COZAAR) 50 MG tablet, Take 1 tablet (50 mg total) by mouth daily. (Patient taking differently: Take 50 mg by mouth at bedtime.), Disp: 30 tablet, Rfl: 3   metoprolol succinate (TOPROL-XL) 50 MG 24 hr tablet, Take 50 mg by mouth daily. Take with or immediately following a meal., Disp: , Rfl:    mirtazapine (REMERON) 30 MG tablet, Take 30 mg by mouth at bedtime., Disp: , Rfl:    ondansetron (ZOFRAN) 4 MG tablet, Take 1 tablet (4 mg total) by mouth every 8 (eight) hours as needed for  nausea or vomiting. (Patient taking differently: Take 8 mg by mouth every 8 (eight) hours as needed for nausea or vomiting.), Disp: 20 tablet, Rfl: 0   oxyCODONE (OXY IR/ROXICODONE) 5 MG immediate release tablet, Take 0.5-1 tablets (2.5-5 mg total) by mouth every 4 (four) hours as needed for severe pain., Disp: 30 tablet, Rfl: 0   pantoprazole (PROTONIX) 40 MG tablet, Take 1 tablet (40 mg total) by mouth daily. (Patient taking differently: Take 40 mg by mouth 2 (two) times daily.), Disp: 30 tablet, Rfl: 3   polyethylene glycol (MIRALAX / GLYCOLAX) 17 g packet, Take 17 g by mouth daily as needed for moderate constipation. (Patient taking differently: Take 17 g by mouth daily.), Disp: 14 each, Rfl: 0   predniSONE (DELTASONE) 20 MG tablet, Take 20 mg by mouth daily., Disp: , Rfl:    PROCTO-MED HC 2.5 % rectal cream, Apply 1 application topically daily as needed for hemorrhoids., Disp: , Rfl:    traMADol (ULTRAM) 50 MG tablet, Take 50-100 mg by mouth every 6 (six) hours as needed for severe pain or moderate pain., Disp: , Rfl:    VENTOLIN HFA 108 (90 Base) MCG/ACT inhaler, INHALE 2 PUFFS INTO THE LUNGS EVERY 4 HOURS AS NEEDED FOR WHEEZING ORSHORTNESS OF BREATH (Patient taking differently: Inhale 2 puffs into the lungs every 4 (four) hours as needed for wheezing or shortness of breath.), Disp: 8 g, Rfl: 0   vitamin C (ASCORBIC ACID) 500 MG tablet, Take 500 mg by mouth daily., Disp: , Rfl:    Vitamin D, Ergocalciferol, (DRISDOL) 1.25 MG (50000 UNIT) CAPS capsule, Take 1 capsule (50,000 Units total) by mouth every 7 (seven) days., Disp: 5 capsule, Rfl: 0   Review of Systems  Constitutional:  Negative for activity change, appetite change, chills, diaphoresis, fatigue, fever and unexpected weight change.  HENT:  Negative for congestion,  rhinorrhea, sinus pressure, sneezing, sore throat and trouble swallowing.   Eyes:  Negative for photophobia and visual disturbance.  Respiratory:  Positive for shortness of  breath. Negative for cough, chest tightness, wheezing and stridor.   Cardiovascular:  Negative for chest pain, palpitations and leg swelling.  Gastrointestinal:  Negative for abdominal distention, abdominal pain, anal bleeding, blood in stool, constipation, diarrhea, nausea and vomiting.  Genitourinary:  Negative for difficulty urinating, dysuria, flank pain and hematuria.  Musculoskeletal:  Positive for myalgias. Negative for arthralgias, back pain, gait problem and joint swelling.  Skin:  Positive for wound. Negative for color change, pallor and rash.  Neurological:  Negative for dizziness, tremors, weakness and light-headedness.  Hematological:  Negative for adenopathy. Does not bruise/bleed easily.  Psychiatric/Behavioral:  Negative for agitation, behavioral problems, confusion, decreased concentration, dysphoric mood and sleep disturbance.       Objective:   Physical Exam Constitutional:      General: She is not in acute distress.    Appearance: Normal appearance. She is well-developed. She is not ill-appearing or diaphoretic.  HENT:     Head: Normocephalic and atraumatic.     Right Ear: Hearing and external ear normal.     Left Ear: Hearing and external ear normal.     Nose: No nasal deformity or rhinorrhea.  Eyes:     General: No scleral icterus.    Conjunctiva/sclera: Conjunctivae normal.     Right eye: Right conjunctiva is not injected.     Left eye: Left conjunctiva is not injected.     Pupils: Pupils are equal, round, and reactive to light.  Neck:     Vascular: No JVD.  Cardiovascular:     Rate and Rhythm: Normal rate and regular rhythm.     Heart sounds: S1 normal and S2 normal.  Pulmonary:     Effort: Pulmonary effort is normal. No respiratory distress.  Abdominal:     General: There is no distension.     Palpations: Abdomen is soft.     Tenderness: There is no abdominal tenderness.  Musculoskeletal:        General: Normal range of motion.     Right shoulder:  Normal.     Left shoulder: Normal.     Cervical back: Normal range of motion and neck supple.     Right hip: Normal.     Left hip: Normal.     Right knee: Normal.     Left knee: Normal.  Lymphadenopathy:     Head:     Right side of head: No submandibular, preauricular or posterior auricular adenopathy.     Left side of head: No submandibular, preauricular or posterior auricular adenopathy.     Cervical: No cervical adenopathy.     Right cervical: No superficial or deep cervical adenopathy.    Left cervical: No superficial or deep cervical adenopathy.  Skin:    General: Skin is warm and dry.     Coloration: Skin is not pale.     Findings: No abrasion, bruising, ecchymosis, erythema, lesion or rash.     Nails: There is no clubbing.  Neurological:     General: No focal deficit present.     Mental Status: She is alert and oriented to person, place, and time.     Coordination: Coordination normal.     Gait: Gait normal.  Psychiatric:        Attention and Perception: She is attentive.        Mood and Affect: Mood  normal.        Speech: Speech normal.        Behavior: Behavior normal. Behavior is cooperative.        Thought Content: Thought content normal.        Judgment: Judgment normal.    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  1 could contemplate using rifampin but I become more of a "nihilist" with regards to its use in these types of infections due to the difficulty for patients and tolerating it.  Counseled her with regards to adverse effects that are rare but can possible with daptomycin including myositis and eosinophilic pneumonia.  Diagnosis: Osteomyelitis of tibia  Culture Result:  MRSA  Allergies  Allergen Reactions   Amlodipine Besylate Swelling    Edema   Codeine Nausea And Vomiting   Oxycodone-Acetaminophen Nausea And Vomiting    OPAT Orders Discharge antibiotics to be given via PICC line Discharge antibiotics: Daptomycin 500 mg daily Duration: 42 days End Date:  42 days from start date  PTitusPer Protocol:  Home health RN for IV administration and teaching; PICC line care and labs.    Labs weekly while on IV antibiotics: _x_ CBC with differential  _x_ CMP _x_ CRP _x_ ESR  _x_ CK  _x_ Please pull PIC at completion of IV antibiotics __ Please leave PIC in place until doctor has seen patient or been notified  Fax weekly labs to [phone] Lung cancer: Receiving radiation therapy.  She ad a parotid lesion is being evaluated by ear nose and throat.  Do have some anxiety that there still could be malignancy in the tibia.  The biopsies definitely were negative but potentially could be a sampling issue regardless recent PET scan does not overtly state thsi and hopefully this allergy really is entirely due to MRSA that spread to this tibia potentially in the context of her olecranon bursitis.  COPD: On chronically 2 L via nasal cannula though I did not see that she had oxygen with her today.  She is nervous about any worsening of her pulmonary disease and potential side effect of eosinophilic pneumonia made her nervous though I assured her this is a very rare side effect and we will monitor for this with her  I spent 85 minutes with the patient including than 50% of the time in face to face counseling of the patient and her sister regard the nature of osteomyelitis and hardware associated osteomyelitis in this case due to MRSA going over different antibiotic options personally reviewing CT of the knee and recent PET scans performed updated cultures pathology along with review of medical records in preparation for the visit and during the visit  and in coordination of her care.

## 2021-12-27 ENCOUNTER — Other Ambulatory Visit: Payer: Medicare Other

## 2021-12-28 ENCOUNTER — Inpatient Hospital Stay: Payer: Medicare Other | Admitting: Internal Medicine

## 2021-12-28 ENCOUNTER — Inpatient Hospital Stay: Payer: Medicare Other

## 2021-12-29 ENCOUNTER — Other Ambulatory Visit: Payer: Self-pay

## 2021-12-29 ENCOUNTER — Ambulatory Visit (HOSPITAL_COMMUNITY)
Admission: RE | Admit: 2021-12-29 | Discharge: 2021-12-29 | Disposition: A | Discharge status: Home / Self Care | Payer: Medicare Other | Source: Ambulatory Visit | Attending: Infectious Disease | Admitting: Infectious Disease

## 2021-12-29 DIAGNOSIS — M7989 Other specified soft tissue disorders: Secondary | ICD-10-CM | POA: Diagnosis not present

## 2021-12-29 DIAGNOSIS — Z87891 Personal history of nicotine dependence: Secondary | ICD-10-CM | POA: Diagnosis not present

## 2021-12-29 DIAGNOSIS — C799 Secondary malignant neoplasm of unspecified site: Secondary | ICD-10-CM | POA: Diagnosis not present

## 2021-12-29 DIAGNOSIS — I10 Essential (primary) hypertension: Secondary | ICD-10-CM | POA: Diagnosis not present

## 2021-12-29 DIAGNOSIS — L538 Other specified erythematous conditions: Secondary | ICD-10-CM | POA: Diagnosis not present

## 2021-12-29 DIAGNOSIS — Z9989 Dependence on other enabling machines and devices: Secondary | ICD-10-CM | POA: Diagnosis not present

## 2021-12-29 DIAGNOSIS — Z452 Encounter for adjustment and management of vascular access device: Secondary | ICD-10-CM | POA: Diagnosis not present

## 2021-12-29 DIAGNOSIS — M86252 Subacute osteomyelitis, left femur: Secondary | ICD-10-CM | POA: Diagnosis not present

## 2021-12-29 DIAGNOSIS — G4733 Obstructive sleep apnea (adult) (pediatric): Secondary | ICD-10-CM | POA: Diagnosis not present

## 2021-12-29 DIAGNOSIS — Z9981 Dependence on supplemental oxygen: Secondary | ICD-10-CM | POA: Diagnosis not present

## 2021-12-29 DIAGNOSIS — S82142A Displaced bicondylar fracture of left tibia, initial encounter for closed fracture: Secondary | ICD-10-CM | POA: Diagnosis not present

## 2021-12-29 DIAGNOSIS — D62 Acute posthemorrhagic anemia: Secondary | ICD-10-CM | POA: Diagnosis not present

## 2021-12-29 DIAGNOSIS — A419 Sepsis, unspecified organism: Secondary | ICD-10-CM | POA: Diagnosis not present

## 2021-12-29 DIAGNOSIS — R350 Frequency of micturition: Secondary | ICD-10-CM | POA: Diagnosis not present

## 2021-12-29 DIAGNOSIS — Z66 Do not resuscitate: Secondary | ICD-10-CM | POA: Diagnosis not present

## 2021-12-29 DIAGNOSIS — M868X6 Other osteomyelitis, lower leg: Secondary | ICD-10-CM | POA: Diagnosis present

## 2021-12-29 DIAGNOSIS — K650 Generalized (acute) peritonitis: Secondary | ICD-10-CM | POA: Diagnosis not present

## 2021-12-29 DIAGNOSIS — L89150 Pressure ulcer of sacral region, unstageable: Secondary | ICD-10-CM | POA: Diagnosis present

## 2021-12-29 DIAGNOSIS — R5381 Other malaise: Secondary | ICD-10-CM | POA: Diagnosis not present

## 2021-12-29 DIAGNOSIS — K264 Chronic or unspecified duodenal ulcer with hemorrhage: Secondary | ICD-10-CM | POA: Diagnosis present

## 2021-12-29 DIAGNOSIS — F411 Generalized anxiety disorder: Secondary | ICD-10-CM | POA: Diagnosis not present

## 2021-12-29 DIAGNOSIS — Y848 Other medical procedures as the cause of abnormal reaction of the patient, or of later complication, without mention of misadventure at the time of the procedure: Secondary | ICD-10-CM | POA: Diagnosis not present

## 2021-12-29 DIAGNOSIS — D630 Anemia in neoplastic disease: Secondary | ICD-10-CM | POA: Diagnosis not present

## 2021-12-29 DIAGNOSIS — Z7984 Long term (current) use of oral hypoglycemic drugs: Secondary | ICD-10-CM | POA: Diagnosis not present

## 2021-12-29 DIAGNOSIS — E890 Postprocedural hypothyroidism: Secondary | ICD-10-CM | POA: Diagnosis not present

## 2021-12-29 DIAGNOSIS — Z79899 Other long term (current) drug therapy: Secondary | ICD-10-CM | POA: Diagnosis not present

## 2021-12-29 DIAGNOSIS — Z85118 Personal history of other malignant neoplasm of bronchus and lung: Secondary | ICD-10-CM | POA: Diagnosis not present

## 2021-12-29 DIAGNOSIS — Z5181 Encounter for therapeutic drug level monitoring: Secondary | ICD-10-CM | POA: Diagnosis not present

## 2021-12-29 DIAGNOSIS — R1084 Generalized abdominal pain: Secondary | ICD-10-CM | POA: Diagnosis not present

## 2021-12-29 DIAGNOSIS — G4730 Sleep apnea, unspecified: Secondary | ICD-10-CM | POA: Diagnosis not present

## 2021-12-29 DIAGNOSIS — J449 Chronic obstructive pulmonary disease, unspecified: Secondary | ICD-10-CM | POA: Diagnosis not present

## 2021-12-29 DIAGNOSIS — D638 Anemia in other chronic diseases classified elsewhere: Secondary | ICD-10-CM | POA: Diagnosis not present

## 2021-12-29 DIAGNOSIS — E278 Other specified disorders of adrenal gland: Secondary | ICD-10-CM | POA: Diagnosis not present

## 2021-12-29 DIAGNOSIS — K140 Glossitis: Secondary | ICD-10-CM | POA: Diagnosis present

## 2021-12-29 DIAGNOSIS — C342 Malignant neoplasm of middle lobe, bronchus or lung: Secondary | ICD-10-CM | POA: Diagnosis not present

## 2021-12-29 DIAGNOSIS — I82A11 Acute embolism and thrombosis of right axillary vein: Secondary | ICD-10-CM | POA: Diagnosis not present

## 2021-12-29 DIAGNOSIS — I739 Peripheral vascular disease, unspecified: Secondary | ICD-10-CM | POA: Diagnosis not present

## 2021-12-29 DIAGNOSIS — M8618 Other acute osteomyelitis, other site: Secondary | ICD-10-CM | POA: Diagnosis not present

## 2021-12-29 DIAGNOSIS — F419 Anxiety disorder, unspecified: Secondary | ICD-10-CM | POA: Diagnosis not present

## 2021-12-29 DIAGNOSIS — Z792 Long term (current) use of antibiotics: Secondary | ICD-10-CM | POA: Diagnosis not present

## 2021-12-29 DIAGNOSIS — Z85820 Personal history of malignant melanoma of skin: Secondary | ICD-10-CM | POA: Diagnosis not present

## 2021-12-29 DIAGNOSIS — Z515 Encounter for palliative care: Secondary | ICD-10-CM | POA: Diagnosis not present

## 2021-12-29 DIAGNOSIS — R609 Edema, unspecified: Secondary | ICD-10-CM | POA: Diagnosis not present

## 2021-12-29 DIAGNOSIS — N323 Diverticulum of bladder: Secondary | ICD-10-CM | POA: Diagnosis not present

## 2021-12-29 DIAGNOSIS — M542 Cervicalgia: Secondary | ICD-10-CM | POA: Diagnosis not present

## 2021-12-29 DIAGNOSIS — K5730 Diverticulosis of large intestine without perforation or abscess without bleeding: Secondary | ICD-10-CM | POA: Diagnosis not present

## 2021-12-29 DIAGNOSIS — Z825 Family history of asthma and other chronic lower respiratory diseases: Secondary | ICD-10-CM | POA: Diagnosis not present

## 2021-12-29 DIAGNOSIS — F418 Other specified anxiety disorders: Secondary | ICD-10-CM | POA: Diagnosis not present

## 2021-12-29 DIAGNOSIS — L89152 Pressure ulcer of sacral region, stage 2: Secondary | ICD-10-CM | POA: Diagnosis not present

## 2021-12-29 DIAGNOSIS — J439 Emphysema, unspecified: Secondary | ICD-10-CM | POA: Diagnosis present

## 2021-12-29 DIAGNOSIS — I129 Hypertensive chronic kidney disease with stage 1 through stage 4 chronic kidney disease, or unspecified chronic kidney disease: Secondary | ICD-10-CM | POA: Diagnosis not present

## 2021-12-29 DIAGNOSIS — K219 Gastro-esophageal reflux disease without esophagitis: Secondary | ICD-10-CM | POA: Diagnosis not present

## 2021-12-29 DIAGNOSIS — B3749 Other urogenital candidiasis: Secondary | ICD-10-CM | POA: Diagnosis present

## 2021-12-29 DIAGNOSIS — K828 Other specified diseases of gallbladder: Secondary | ICD-10-CM | POA: Diagnosis not present

## 2021-12-29 DIAGNOSIS — M549 Dorsalgia, unspecified: Secondary | ICD-10-CM | POA: Diagnosis present

## 2021-12-29 DIAGNOSIS — Z933 Colostomy status: Secondary | ICD-10-CM | POA: Diagnosis not present

## 2021-12-29 DIAGNOSIS — J42 Unspecified chronic bronchitis: Secondary | ICD-10-CM | POA: Diagnosis not present

## 2021-12-29 DIAGNOSIS — N1832 Chronic kidney disease, stage 3b: Secondary | ICD-10-CM | POA: Diagnosis present

## 2021-12-29 DIAGNOSIS — M869 Osteomyelitis, unspecified: Secondary | ICD-10-CM | POA: Diagnosis not present

## 2021-12-29 DIAGNOSIS — R109 Unspecified abdominal pain: Secondary | ICD-10-CM | POA: Diagnosis present

## 2021-12-29 DIAGNOSIS — T82868A Thrombosis of vascular prosthetic devices, implants and grafts, initial encounter: Secondary | ICD-10-CM | POA: Diagnosis not present

## 2021-12-29 DIAGNOSIS — K5720 Diverticulitis of large intestine with perforation and abscess without bleeding: Secondary | ICD-10-CM | POA: Diagnosis not present

## 2021-12-29 DIAGNOSIS — Z9049 Acquired absence of other specified parts of digestive tract: Secondary | ICD-10-CM | POA: Diagnosis not present

## 2021-12-29 DIAGNOSIS — M84462D Pathological fracture, left tibia, subsequent encounter for fracture with routine healing: Secondary | ICD-10-CM | POA: Diagnosis not present

## 2021-12-29 DIAGNOSIS — N183 Chronic kidney disease, stage 3 unspecified: Secondary | ICD-10-CM | POA: Diagnosis not present

## 2021-12-29 DIAGNOSIS — K651 Peritoneal abscess: Secondary | ICD-10-CM | POA: Diagnosis not present

## 2021-12-29 DIAGNOSIS — T84623A Infection and inflammatory reaction due to internal fixation device of left tibia, initial encounter: Secondary | ICD-10-CM | POA: Diagnosis not present

## 2021-12-29 DIAGNOSIS — Z20822 Contact with and (suspected) exposure to covid-19: Secondary | ICD-10-CM | POA: Diagnosis not present

## 2021-12-29 DIAGNOSIS — J9611 Chronic respiratory failure with hypoxia: Secondary | ICD-10-CM | POA: Diagnosis not present

## 2021-12-29 DIAGNOSIS — J441 Chronic obstructive pulmonary disease with (acute) exacerbation: Secondary | ICD-10-CM | POA: Diagnosis not present

## 2021-12-29 DIAGNOSIS — C3412 Malignant neoplasm of upper lobe, left bronchus or lung: Secondary | ICD-10-CM | POA: Diagnosis not present

## 2021-12-29 DIAGNOSIS — R188 Other ascites: Secondary | ICD-10-CM | POA: Diagnosis present

## 2021-12-29 DIAGNOSIS — K5732 Diverticulitis of large intestine without perforation or abscess without bleeding: Secondary | ICD-10-CM | POA: Diagnosis not present

## 2021-12-29 DIAGNOSIS — E039 Hypothyroidism, unspecified: Secondary | ICD-10-CM | POA: Diagnosis not present

## 2021-12-29 DIAGNOSIS — K658 Other peritonitis: Secondary | ICD-10-CM | POA: Diagnosis not present

## 2021-12-29 DIAGNOSIS — K6389 Other specified diseases of intestine: Secondary | ICD-10-CM | POA: Diagnosis not present

## 2021-12-29 DIAGNOSIS — M86362 Chronic multifocal osteomyelitis, left tibia and fibula: Secondary | ICD-10-CM | POA: Insufficient documentation

## 2021-12-29 DIAGNOSIS — F32A Depression, unspecified: Secondary | ICD-10-CM | POA: Diagnosis present

## 2021-12-29 DIAGNOSIS — E785 Hyperlipidemia, unspecified: Secondary | ICD-10-CM | POA: Diagnosis not present

## 2021-12-29 DIAGNOSIS — Z7952 Long term (current) use of systemic steroids: Secondary | ICD-10-CM | POA: Diagnosis not present

## 2021-12-29 DIAGNOSIS — H269 Unspecified cataract: Secondary | ICD-10-CM | POA: Diagnosis not present

## 2021-12-29 DIAGNOSIS — B9562 Methicillin resistant Staphylococcus aureus infection as the cause of diseases classified elsewhere: Secondary | ICD-10-CM | POA: Diagnosis not present

## 2021-12-29 DIAGNOSIS — K3189 Other diseases of stomach and duodenum: Secondary | ICD-10-CM | POA: Diagnosis not present

## 2021-12-29 DIAGNOSIS — I5033 Acute on chronic diastolic (congestive) heart failure: Secondary | ICD-10-CM | POA: Diagnosis not present

## 2021-12-29 DIAGNOSIS — N179 Acute kidney failure, unspecified: Secondary | ICD-10-CM | POA: Diagnosis not present

## 2021-12-29 DIAGNOSIS — T847XXD Infection and inflammatory reaction due to other internal orthopedic prosthetic devices, implants and grafts, subsequent encounter: Secondary | ICD-10-CM | POA: Diagnosis not present

## 2021-12-29 IMAGING — US IR PICC >5YO
3 series · 6 of 6 positions shown · IV contrast (agent unspecified)
Comparison: none

INDICATION: Osteomyelitis

EXAM:
ULTRASOUND AND FLUOROSCOPIC GUIDED PICC LINE INSERTION
MEDICATIONS:
None.
CONTRAST:  None
FLUOROSCOPY TIME:  30 seconds (0.4 mGy)
COMPLICATIONS:
SIR Level A - No therapy, no consequence.  2 small hematomas
TECHNIQUE: The procedure, risks, benefits, and alternatives were explained to
the patient and/or patient's representative and informed written
consent was obtained. A timeout was performed prior to the
initiation of the procedure.

[Series 1: fl(-)  angio sharp · 1 of 1 slices shown (1 of 2)]
[im 1/1]
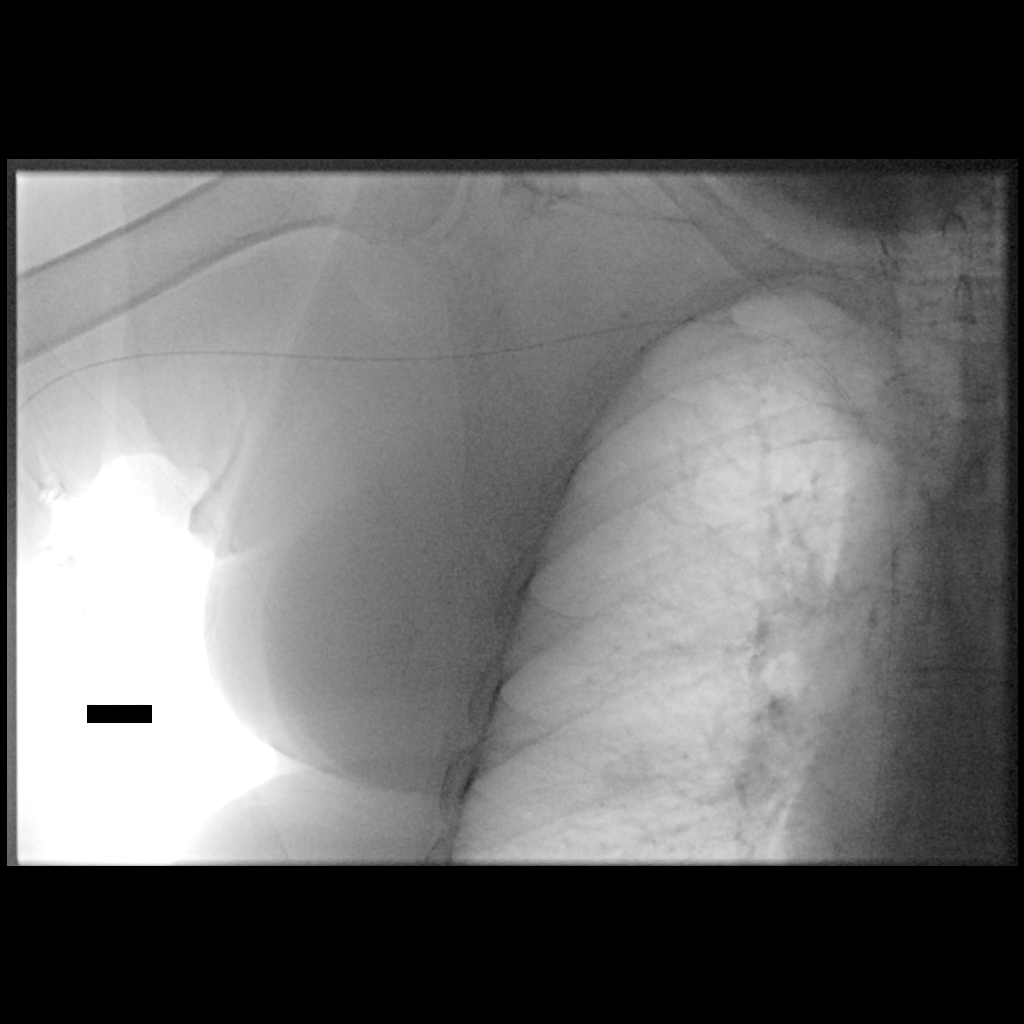

[Series 1: ir picc >5yo · 2 of 2 slices shown]
[im 1/2]
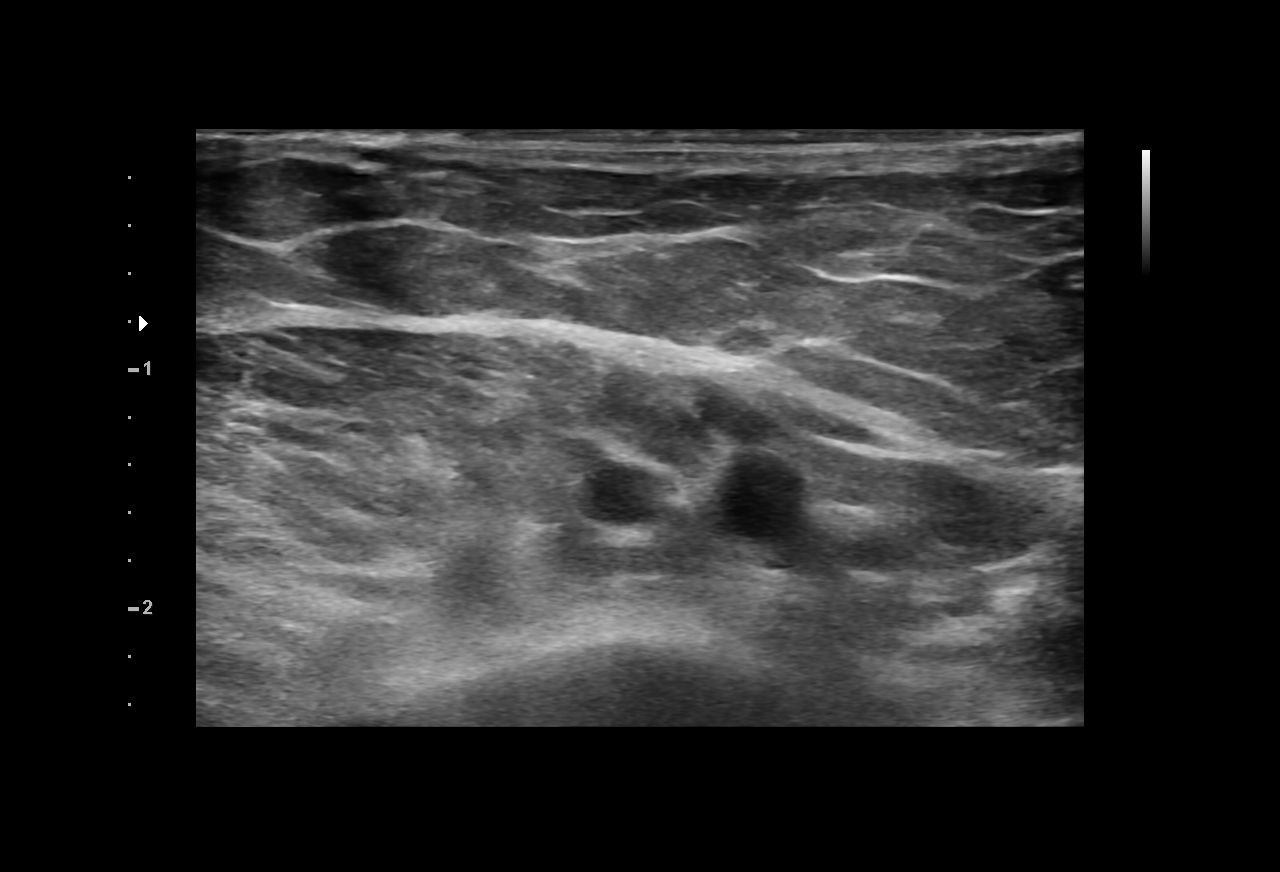
[im 2/2]
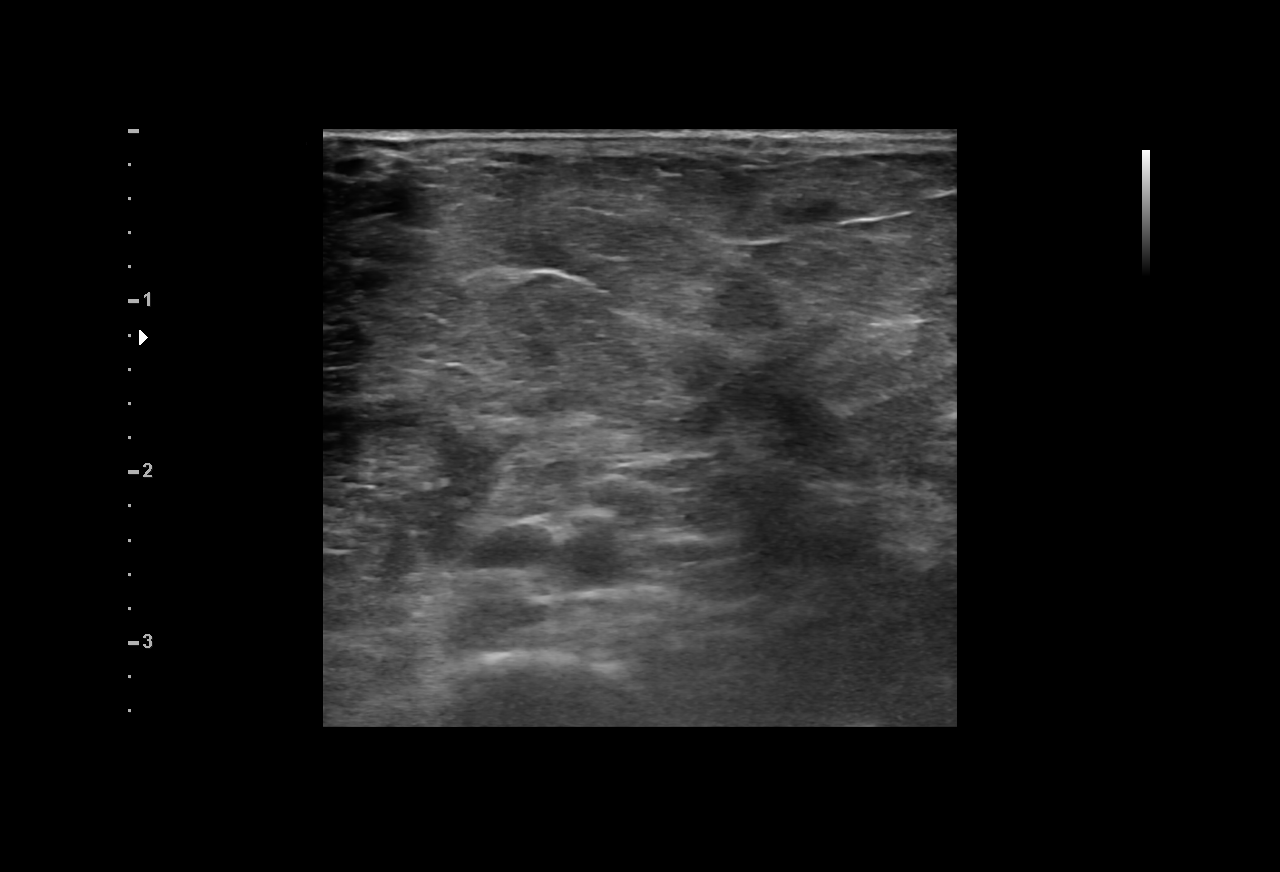

[Series 2: fl(-)  angio sharp · 2 acquisitions, 3 frames shown (2 of 2)]
[im 1/2]
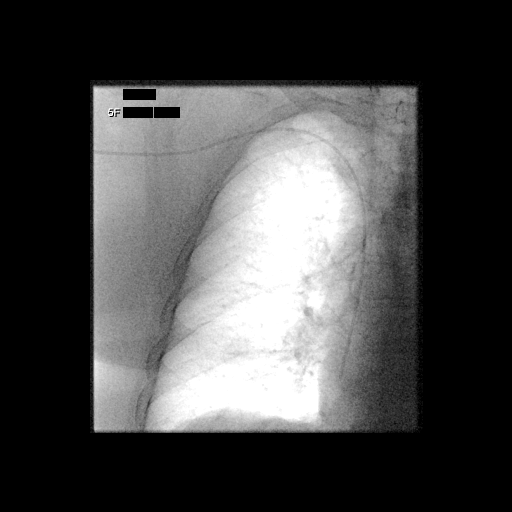
[im 1/2]
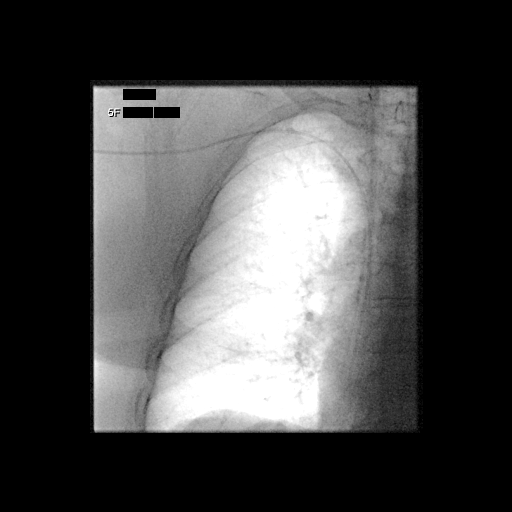
[im 2/2]
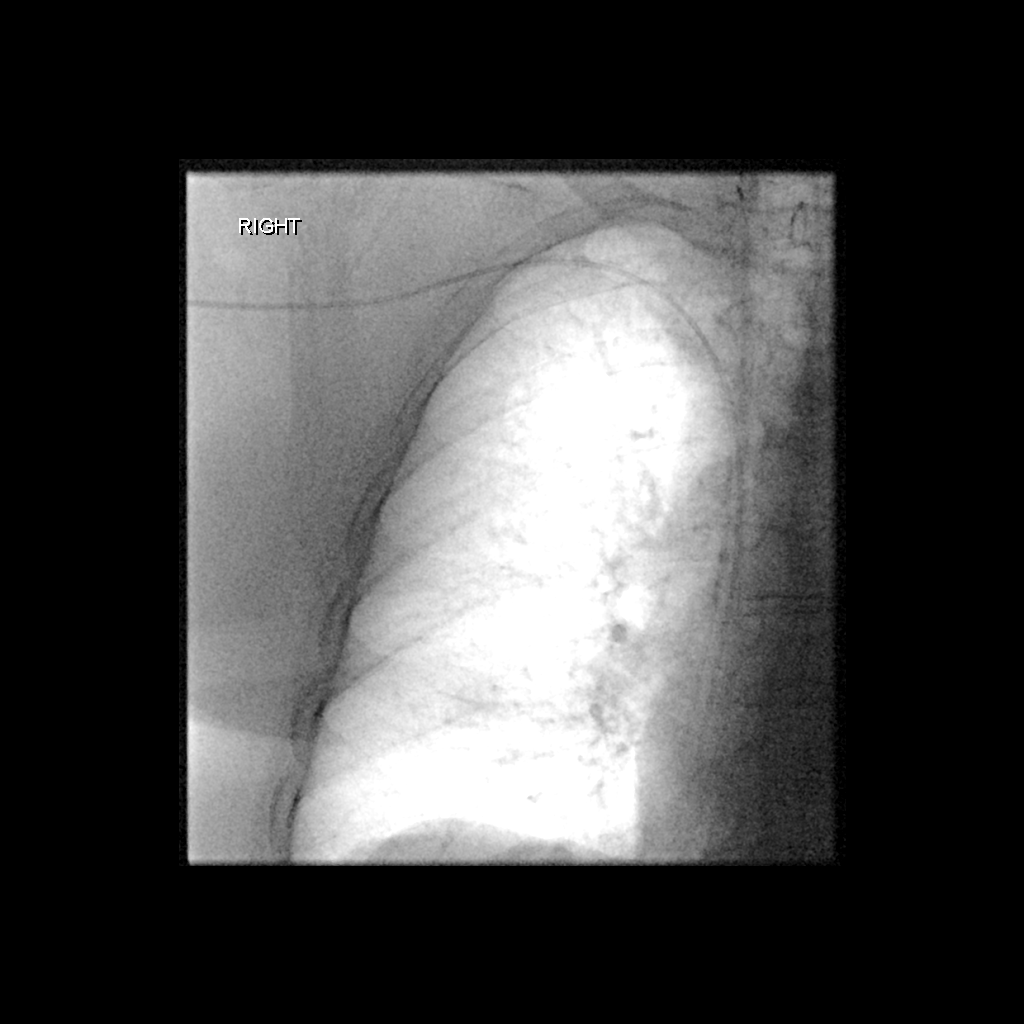

[6 of 6 positions shown; findings below may reference images not displayed]

The RIGHT upper extremity was prepped with chlorhexidine in a
sterile fashion, and a sterile drape was applied covering the
operative field. Maximum barrier sterile technique with sterile
gowns and gloves were used for the procedure. A timeout was
performed prior to the initiation of the procedure. Local anesthesia
was provided with 1% lidocaine.

Under direct ultrasound guidance, the brachial vein was accessed
with a micropuncture kit after the overlying soft tissues were
anesthetized with 1% lidocaine. Real-time ultrasound guidance was
utilized for vascular access including the acquisition of a
permanent ultrasound image documenting patency of the accessed
vessel. 3 attempts were required to achieve appropriate access

A guidewire was advanced to the level of the superior caval-atrial
junction for measurement purposes and the PICC line was cut to
length. A peel-away sheath was placed and a 33 cm, 5 French, single
lumen was inserted to level of the superior caval-atrial junction. A
post procedure spot fluoroscopic was obtained. The catheter easily
aspirated and flushed and was secured in place. A dressing was
applied. The patient tolerated the procedure well without immediate
post procedural complication.
FINDINGS: After catheter placement, the tip lies within the superior
cavoatrial junction. The catheter aspirates and flushes normally and
is ready for immediate use.
IMPRESSION: Successful placement of a RIGHT brachial vein approach, 33 cm, 4
French, single lumen PICC

The tip of the catheter is positioned at the superior cavoatrial
junction. The PICC line is ready for immediate use.

Examination read by SOKHNA, IR PA

## 2021-12-29 MED ORDER — HEPARIN SOD (PORK) LOCK FLUSH 100 UNIT/ML IV SOLN
INTRAVENOUS | Status: AC
  Filled 2021-12-29: qty 5

## 2021-12-29 MED ORDER — HEPARIN SOD (PORK) LOCK FLUSH 100 UNIT/ML IV SOLN
INTRAVENOUS | Status: DC | PRN
  Administered 2021-12-29: 500 [IU] via INTRAVENOUS

## 2021-12-29 MED ORDER — LIDOCAINE HCL 1 % IJ SOLN
INTRAMUSCULAR | Status: DC | PRN
  Administered 2021-12-29: 4 mL

## 2021-12-29 MED ORDER — LIDOCAINE HCL 1 % IJ SOLN
INTRAMUSCULAR | Status: DC
Start: 2021-12-29 — End: 2021-12-30
  Filled 2021-12-29: qty 20

## 2021-12-29 NOTE — Procedures (Signed)
Successful placement of single lumen PICC line to right brachial vein. ?Length 33cm ?Tip at lower SVC/RA ?PICC capped ?No complications ?Ready for use. ? ?EBL < 5 mL ? ? ?Veanna Dower PA-C ?12/29/2021 ?3:25 PM ? ?  ?

## 2021-12-30 DIAGNOSIS — Z9981 Dependence on supplemental oxygen: Secondary | ICD-10-CM | POA: Diagnosis not present

## 2021-12-30 DIAGNOSIS — C342 Malignant neoplasm of middle lobe, bronchus or lung: Secondary | ICD-10-CM | POA: Diagnosis not present

## 2021-12-30 DIAGNOSIS — Z7984 Long term (current) use of oral hypoglycemic drugs: Secondary | ICD-10-CM | POA: Diagnosis not present

## 2021-12-30 DIAGNOSIS — Z5181 Encounter for therapeutic drug level monitoring: Secondary | ICD-10-CM | POA: Diagnosis not present

## 2021-12-30 DIAGNOSIS — C3412 Malignant neoplasm of upper lobe, left bronchus or lung: Secondary | ICD-10-CM | POA: Diagnosis not present

## 2021-12-30 DIAGNOSIS — M86362 Chronic multifocal osteomyelitis, left tibia and fibula: Secondary | ICD-10-CM | POA: Diagnosis not present

## 2021-12-30 DIAGNOSIS — B9562 Methicillin resistant Staphylococcus aureus infection as the cause of diseases classified elsewhere: Secondary | ICD-10-CM | POA: Diagnosis not present

## 2021-12-30 DIAGNOSIS — I10 Essential (primary) hypertension: Secondary | ICD-10-CM | POA: Diagnosis not present

## 2021-12-30 DIAGNOSIS — R350 Frequency of micturition: Secondary | ICD-10-CM | POA: Diagnosis not present

## 2021-12-30 DIAGNOSIS — D630 Anemia in neoplastic disease: Secondary | ICD-10-CM | POA: Diagnosis not present

## 2021-12-30 DIAGNOSIS — I5033 Acute on chronic diastolic (congestive) heart failure: Secondary | ICD-10-CM | POA: Diagnosis not present

## 2021-12-30 DIAGNOSIS — E890 Postprocedural hypothyroidism: Secondary | ICD-10-CM | POA: Diagnosis not present

## 2021-12-30 DIAGNOSIS — I129 Hypertensive chronic kidney disease with stage 1 through stage 4 chronic kidney disease, or unspecified chronic kidney disease: Secondary | ICD-10-CM | POA: Diagnosis not present

## 2021-12-30 DIAGNOSIS — J449 Chronic obstructive pulmonary disease, unspecified: Secondary | ICD-10-CM | POA: Diagnosis not present

## 2021-12-30 DIAGNOSIS — Z452 Encounter for adjustment and management of vascular access device: Secondary | ICD-10-CM | POA: Diagnosis not present

## 2021-12-30 DIAGNOSIS — F419 Anxiety disorder, unspecified: Secondary | ICD-10-CM | POA: Diagnosis not present

## 2021-12-30 DIAGNOSIS — G4730 Sleep apnea, unspecified: Secondary | ICD-10-CM | POA: Diagnosis not present

## 2021-12-30 DIAGNOSIS — E039 Hypothyroidism, unspecified: Secondary | ICD-10-CM | POA: Diagnosis not present

## 2021-12-30 DIAGNOSIS — Z7952 Long term (current) use of systemic steroids: Secondary | ICD-10-CM | POA: Diagnosis not present

## 2021-12-30 DIAGNOSIS — H269 Unspecified cataract: Secondary | ICD-10-CM | POA: Diagnosis not present

## 2021-12-30 DIAGNOSIS — E785 Hyperlipidemia, unspecified: Secondary | ICD-10-CM | POA: Diagnosis not present

## 2021-12-30 DIAGNOSIS — J9611 Chronic respiratory failure with hypoxia: Secondary | ICD-10-CM | POA: Diagnosis not present

## 2021-12-30 DIAGNOSIS — M84462D Pathological fracture, left tibia, subsequent encounter for fracture with routine healing: Secondary | ICD-10-CM | POA: Diagnosis not present

## 2021-12-30 DIAGNOSIS — I739 Peripheral vascular disease, unspecified: Secondary | ICD-10-CM | POA: Diagnosis not present

## 2021-12-30 DIAGNOSIS — F32A Depression, unspecified: Secondary | ICD-10-CM | POA: Diagnosis not present

## 2021-12-30 DIAGNOSIS — T84623A Infection and inflammatory reaction due to internal fixation device of left tibia, initial encounter: Secondary | ICD-10-CM | POA: Diagnosis not present

## 2021-12-30 DIAGNOSIS — Z792 Long term (current) use of antibiotics: Secondary | ICD-10-CM | POA: Diagnosis not present

## 2021-12-30 DIAGNOSIS — M542 Cervicalgia: Secondary | ICD-10-CM | POA: Diagnosis not present

## 2021-12-31 DIAGNOSIS — A419 Sepsis, unspecified organism: Secondary | ICD-10-CM | POA: Diagnosis not present

## 2021-12-31 DIAGNOSIS — R1084 Generalized abdominal pain: Secondary | ICD-10-CM | POA: Diagnosis not present

## 2021-12-31 DIAGNOSIS — Z20822 Contact with and (suspected) exposure to covid-19: Secondary | ICD-10-CM | POA: Diagnosis not present

## 2021-12-31 DIAGNOSIS — K5730 Diverticulosis of large intestine without perforation or abscess without bleeding: Secondary | ICD-10-CM | POA: Diagnosis not present

## 2021-12-31 DIAGNOSIS — E278 Other specified disorders of adrenal gland: Secondary | ICD-10-CM | POA: Diagnosis not present

## 2021-12-31 DIAGNOSIS — K5720 Diverticulitis of large intestine with perforation and abscess without bleeding: Secondary | ICD-10-CM | POA: Diagnosis not present

## 2021-12-31 DIAGNOSIS — K6389 Other specified diseases of intestine: Secondary | ICD-10-CM | POA: Diagnosis not present

## 2022-01-01 ENCOUNTER — Other Ambulatory Visit: Payer: Self-pay

## 2022-01-01 ENCOUNTER — Encounter (HOSPITAL_COMMUNITY): Payer: Self-pay | Admitting: Internal Medicine

## 2022-01-01 ENCOUNTER — Inpatient Hospital Stay (HOSPITAL_COMMUNITY): Payer: Medicare Other

## 2022-01-01 ENCOUNTER — Inpatient Hospital Stay (HOSPITAL_COMMUNITY)
Admission: AD | Admit: 2022-01-01 | Discharge: 2022-01-12 | DRG: 329 | Disposition: A | Discharge status: Rehabilitation Facility | Payer: Medicare Other | Source: Other Acute Inpatient Hospital | Attending: Internal Medicine | Admitting: Internal Medicine

## 2022-01-01 DIAGNOSIS — M8618 Other acute osteomyelitis, other site: Secondary | ICD-10-CM | POA: Diagnosis not present

## 2022-01-01 DIAGNOSIS — J9611 Chronic respiratory failure with hypoxia: Secondary | ICD-10-CM | POA: Diagnosis present

## 2022-01-01 DIAGNOSIS — M86252 Subacute osteomyelitis, left femur: Secondary | ICD-10-CM | POA: Diagnosis not present

## 2022-01-01 DIAGNOSIS — F418 Other specified anxiety disorders: Secondary | ICD-10-CM | POA: Diagnosis not present

## 2022-01-01 DIAGNOSIS — M549 Dorsalgia, unspecified: Secondary | ICD-10-CM | POA: Diagnosis present

## 2022-01-01 DIAGNOSIS — Z79899 Other long term (current) drug therapy: Secondary | ICD-10-CM | POA: Diagnosis not present

## 2022-01-01 DIAGNOSIS — E785 Hyperlipidemia, unspecified: Secondary | ICD-10-CM | POA: Diagnosis not present

## 2022-01-01 DIAGNOSIS — Z85820 Personal history of malignant melanoma of skin: Secondary | ICD-10-CM | POA: Diagnosis not present

## 2022-01-01 DIAGNOSIS — L8990 Pressure ulcer of unspecified site, unspecified stage: Secondary | ICD-10-CM | POA: Diagnosis not present

## 2022-01-01 DIAGNOSIS — I82A11 Acute embolism and thrombosis of right axillary vein: Secondary | ICD-10-CM | POA: Diagnosis not present

## 2022-01-01 DIAGNOSIS — Z8614 Personal history of Methicillin resistant Staphylococcus aureus infection: Secondary | ICD-10-CM

## 2022-01-01 DIAGNOSIS — J42 Unspecified chronic bronchitis: Secondary | ICD-10-CM | POA: Diagnosis not present

## 2022-01-01 DIAGNOSIS — L538 Other specified erythematous conditions: Secondary | ICD-10-CM | POA: Diagnosis not present

## 2022-01-01 DIAGNOSIS — K658 Other peritonitis: Secondary | ICD-10-CM | POA: Diagnosis not present

## 2022-01-01 DIAGNOSIS — T82868A Thrombosis of vascular prosthetic devices, implants and grafts, initial encounter: Secondary | ICD-10-CM | POA: Diagnosis not present

## 2022-01-01 DIAGNOSIS — M7989 Other specified soft tissue disorders: Secondary | ICD-10-CM | POA: Diagnosis not present

## 2022-01-01 DIAGNOSIS — G4733 Obstructive sleep apnea (adult) (pediatric): Secondary | ICD-10-CM | POA: Diagnosis present

## 2022-01-01 DIAGNOSIS — K219 Gastro-esophageal reflux disease without esophagitis: Secondary | ICD-10-CM | POA: Diagnosis not present

## 2022-01-01 DIAGNOSIS — L89152 Pressure ulcer of sacral region, stage 2: Secondary | ICD-10-CM

## 2022-01-01 DIAGNOSIS — S82142A Displaced bicondylar fracture of left tibia, initial encounter for closed fracture: Secondary | ICD-10-CM | POA: Diagnosis present

## 2022-01-01 DIAGNOSIS — I129 Hypertensive chronic kidney disease with stage 1 through stage 4 chronic kidney disease, or unspecified chronic kidney disease: Secondary | ICD-10-CM | POA: Diagnosis present

## 2022-01-01 DIAGNOSIS — I10 Essential (primary) hypertension: Secondary | ICD-10-CM | POA: Diagnosis not present

## 2022-01-01 DIAGNOSIS — B9562 Methicillin resistant Staphylococcus aureus infection as the cause of diseases classified elsewhere: Secondary | ICD-10-CM | POA: Diagnosis present

## 2022-01-01 DIAGNOSIS — C342 Malignant neoplasm of middle lobe, bronchus or lung: Secondary | ICD-10-CM | POA: Diagnosis present

## 2022-01-01 DIAGNOSIS — M868X6 Other osteomyelitis, lower leg: Secondary | ICD-10-CM | POA: Diagnosis present

## 2022-01-01 DIAGNOSIS — K650 Generalized (acute) peritonitis: Secondary | ICD-10-CM | POA: Diagnosis present

## 2022-01-01 DIAGNOSIS — K5720 Diverticulitis of large intestine with perforation and abscess without bleeding: Secondary | ICD-10-CM | POA: Diagnosis present

## 2022-01-01 DIAGNOSIS — I5033 Acute on chronic diastolic (congestive) heart failure: Secondary | ICD-10-CM | POA: Diagnosis not present

## 2022-01-01 DIAGNOSIS — E039 Hypothyroidism, unspecified: Secondary | ICD-10-CM | POA: Diagnosis not present

## 2022-01-01 DIAGNOSIS — K5730 Diverticulosis of large intestine without perforation or abscess without bleeding: Secondary | ICD-10-CM | POA: Diagnosis not present

## 2022-01-01 DIAGNOSIS — D62 Acute posthemorrhagic anemia: Secondary | ICD-10-CM | POA: Diagnosis present

## 2022-01-01 DIAGNOSIS — Z20822 Contact with and (suspected) exposure to covid-19: Secondary | ICD-10-CM | POA: Diagnosis present

## 2022-01-01 DIAGNOSIS — J439 Emphysema, unspecified: Secondary | ICD-10-CM | POA: Diagnosis present

## 2022-01-01 DIAGNOSIS — L89150 Pressure ulcer of sacral region, unstageable: Secondary | ICD-10-CM | POA: Diagnosis present

## 2022-01-01 DIAGNOSIS — Y848 Other medical procedures as the cause of abnormal reaction of the patient, or of later complication, without mention of misadventure at the time of the procedure: Secondary | ICD-10-CM | POA: Diagnosis not present

## 2022-01-01 DIAGNOSIS — M869 Osteomyelitis, unspecified: Secondary | ICD-10-CM | POA: Diagnosis present

## 2022-01-01 DIAGNOSIS — C4490 Unspecified malignant neoplasm of skin, unspecified: Secondary | ICD-10-CM | POA: Diagnosis present

## 2022-01-01 DIAGNOSIS — A419 Sepsis, unspecified organism: Secondary | ICD-10-CM | POA: Diagnosis not present

## 2022-01-01 DIAGNOSIS — T847XXA Infection and inflammatory reaction due to other internal orthopedic prosthetic devices, implants and grafts, initial encounter: Principal | ICD-10-CM

## 2022-01-01 DIAGNOSIS — E782 Mixed hyperlipidemia: Secondary | ICD-10-CM | POA: Diagnosis present

## 2022-01-01 DIAGNOSIS — N1832 Chronic kidney disease, stage 3b: Secondary | ICD-10-CM | POA: Diagnosis present

## 2022-01-01 DIAGNOSIS — C799 Secondary malignant neoplasm of unspecified site: Secondary | ICD-10-CM | POA: Diagnosis present

## 2022-01-01 DIAGNOSIS — K651 Peritoneal abscess: Secondary | ICD-10-CM | POA: Diagnosis present

## 2022-01-01 DIAGNOSIS — F411 Generalized anxiety disorder: Secondary | ICD-10-CM | POA: Diagnosis present

## 2022-01-01 DIAGNOSIS — N183 Chronic kidney disease, stage 3 unspecified: Secondary | ICD-10-CM | POA: Diagnosis present

## 2022-01-01 DIAGNOSIS — Z87891 Personal history of nicotine dependence: Secondary | ICD-10-CM

## 2022-01-01 DIAGNOSIS — R188 Other ascites: Secondary | ICD-10-CM | POA: Diagnosis present

## 2022-01-01 DIAGNOSIS — B3749 Other urogenital candidiasis: Secondary | ICD-10-CM | POA: Diagnosis present

## 2022-01-01 DIAGNOSIS — Z9981 Dependence on supplemental oxygen: Secondary | ICD-10-CM | POA: Diagnosis not present

## 2022-01-01 DIAGNOSIS — K828 Other specified diseases of gallbladder: Secondary | ICD-10-CM | POA: Diagnosis not present

## 2022-01-01 DIAGNOSIS — R609 Edema, unspecified: Secondary | ICD-10-CM | POA: Diagnosis not present

## 2022-01-01 DIAGNOSIS — Z885 Allergy status to narcotic agent status: Secondary | ICD-10-CM

## 2022-01-01 DIAGNOSIS — Z825 Family history of asthma and other chronic lower respiratory diseases: Secondary | ICD-10-CM

## 2022-01-01 DIAGNOSIS — Z933 Colostomy status: Secondary | ICD-10-CM | POA: Diagnosis not present

## 2022-01-01 DIAGNOSIS — Z85118 Personal history of other malignant neoplasm of bronchus and lung: Secondary | ICD-10-CM | POA: Diagnosis not present

## 2022-01-01 DIAGNOSIS — N739 Female pelvic inflammatory disease, unspecified: Secondary | ICD-10-CM | POA: Diagnosis present

## 2022-01-01 DIAGNOSIS — K6389 Other specified diseases of intestine: Secondary | ICD-10-CM | POA: Diagnosis not present

## 2022-01-01 DIAGNOSIS — Z9989 Dependence on other enabling machines and devices: Secondary | ICD-10-CM

## 2022-01-01 DIAGNOSIS — M84462D Pathological fracture, left tibia, subsequent encounter for fracture with routine healing: Secondary | ICD-10-CM | POA: Diagnosis present

## 2022-01-01 DIAGNOSIS — D497 Neoplasm of unspecified behavior of endocrine glands and other parts of nervous system: Secondary | ICD-10-CM | POA: Diagnosis present

## 2022-01-01 DIAGNOSIS — Z801 Family history of malignant neoplasm of trachea, bronchus and lung: Secondary | ICD-10-CM

## 2022-01-01 DIAGNOSIS — T847XXD Infection and inflammatory reaction due to other internal orthopedic prosthetic devices, implants and grafts, subsequent encounter: Secondary | ICD-10-CM

## 2022-01-01 DIAGNOSIS — K140 Glossitis: Secondary | ICD-10-CM | POA: Diagnosis present

## 2022-01-01 DIAGNOSIS — K5732 Diverticulitis of large intestine without perforation or abscess without bleeding: Secondary | ICD-10-CM | POA: Diagnosis not present

## 2022-01-01 DIAGNOSIS — K3189 Other diseases of stomach and duodenum: Secondary | ICD-10-CM | POA: Diagnosis not present

## 2022-01-01 DIAGNOSIS — Z9049 Acquired absence of other specified parts of digestive tract: Secondary | ICD-10-CM | POA: Diagnosis not present

## 2022-01-01 DIAGNOSIS — Z888 Allergy status to other drugs, medicaments and biological substances status: Secondary | ICD-10-CM

## 2022-01-01 DIAGNOSIS — N179 Acute kidney failure, unspecified: Secondary | ICD-10-CM | POA: Diagnosis not present

## 2022-01-01 DIAGNOSIS — Z515 Encounter for palliative care: Secondary | ICD-10-CM | POA: Diagnosis not present

## 2022-01-01 DIAGNOSIS — E890 Postprocedural hypothyroidism: Secondary | ICD-10-CM | POA: Diagnosis present

## 2022-01-01 DIAGNOSIS — J441 Chronic obstructive pulmonary disease with (acute) exacerbation: Secondary | ICD-10-CM | POA: Diagnosis not present

## 2022-01-01 DIAGNOSIS — D638 Anemia in other chronic diseases classified elsewhere: Secondary | ICD-10-CM | POA: Diagnosis present

## 2022-01-01 DIAGNOSIS — Z66 Do not resuscitate: Secondary | ICD-10-CM | POA: Diagnosis present

## 2022-01-01 DIAGNOSIS — K5780 Diverticulitis of intestine, part unspecified, with perforation and abscess without bleeding: Secondary | ICD-10-CM | POA: Diagnosis present

## 2022-01-01 DIAGNOSIS — F32A Depression, unspecified: Secondary | ICD-10-CM | POA: Diagnosis present

## 2022-01-01 DIAGNOSIS — K264 Chronic or unspecified duodenal ulcer with hemorrhage: Secondary | ICD-10-CM | POA: Diagnosis present

## 2022-01-01 DIAGNOSIS — R1084 Generalized abdominal pain: Secondary | ICD-10-CM | POA: Diagnosis not present

## 2022-01-01 DIAGNOSIS — N323 Diverticulum of bladder: Secondary | ICD-10-CM | POA: Diagnosis not present

## 2022-01-01 DIAGNOSIS — J449 Chronic obstructive pulmonary disease, unspecified: Secondary | ICD-10-CM | POA: Diagnosis not present

## 2022-01-01 DIAGNOSIS — T380X5A Adverse effect of glucocorticoids and synthetic analogues, initial encounter: Secondary | ICD-10-CM | POA: Diagnosis present

## 2022-01-01 DIAGNOSIS — Z8249 Family history of ischemic heart disease and other diseases of the circulatory system: Secondary | ICD-10-CM

## 2022-01-01 DIAGNOSIS — R109 Unspecified abdominal pain: Secondary | ICD-10-CM | POA: Diagnosis present

## 2022-01-01 DIAGNOSIS — Z7952 Long term (current) use of systemic steroids: Secondary | ICD-10-CM

## 2022-01-01 DIAGNOSIS — R5381 Other malaise: Secondary | ICD-10-CM | POA: Diagnosis present

## 2022-01-01 DIAGNOSIS — Z90710 Acquired absence of both cervix and uterus: Secondary | ICD-10-CM

## 2022-01-01 LAB — COMPREHENSIVE METABOLIC PANEL
ALT: 9 U/L (ref 0–44)
AST: 11 U/L — ABNORMAL LOW (ref 15–41)
Albumin: 1.8 g/dL — ABNORMAL LOW (ref 3.5–5.0)
Alkaline Phosphatase: 82 U/L (ref 38–126)
Anion gap: 8 (ref 5–15)
BUN: 20 mg/dL (ref 8–23)
CO2: 32 mmol/L (ref 22–32)
Calcium: 9.3 mg/dL (ref 8.9–10.3)
Chloride: 98 mmol/L (ref 98–111)
Creatinine, Ser: 1.29 mg/dL — ABNORMAL HIGH (ref 0.44–1.00)
GFR, Estimated: 43 mL/min — ABNORMAL LOW (ref >60)
Glucose, Bld: 159 mg/dL — ABNORMAL HIGH (ref 70–99)
Potassium: 4.3 mmol/L (ref 3.5–5.1)
Sodium: 138 mmol/L (ref 135–145)
Total Bilirubin: 0.4 mg/dL (ref 0.3–1.2)
Total Protein: 5.6 g/dL — ABNORMAL LOW (ref 6.5–8.1)

## 2022-01-01 LAB — CBC WITH DIFFERENTIAL/PLATELET
Abs Immature Granulocytes: 0.31 10*3/uL — ABNORMAL HIGH (ref 0.00–0.07)
Basophils Absolute: 0.1 10*3/uL (ref 0.0–0.1)
Basophils Relative: 0 %
Eosinophils Absolute: 0 10*3/uL (ref 0.0–0.5)
Eosinophils Relative: 0 %
HCT: 28.2 % — ABNORMAL LOW (ref 36.0–46.0)
Hemoglobin: 8.4 g/dL — ABNORMAL LOW (ref 12.0–15.0)
Immature Granulocytes: 1 %
Lymphocytes Relative: 5 %
Lymphs Abs: 1.4 10*3/uL (ref 0.7–4.0)
MCH: 28.4 pg (ref 26.0–34.0)
MCHC: 29.8 g/dL — ABNORMAL LOW (ref 30.0–36.0)
MCV: 95.3 fL (ref 80.0–100.0)
Monocytes Absolute: 1.1 10*3/uL — ABNORMAL HIGH (ref 0.1–1.0)
Monocytes Relative: 4 %
Neutro Abs: 24.8 10*3/uL — ABNORMAL HIGH (ref 1.7–7.7)
Neutrophils Relative %: 90 %
Platelets: 284 10*3/uL (ref 150–400)
RBC: 2.96 MIL/uL — ABNORMAL LOW (ref 3.87–5.11)
RDW: 17.4 % — ABNORMAL HIGH (ref 11.5–15.5)
WBC Morphology: INCREASED
WBC: 27.7 10*3/uL — ABNORMAL HIGH (ref 4.0–10.5)
nRBC: 0 % (ref 0.0–0.2)

## 2022-01-01 LAB — MAGNESIUM: Magnesium: 2.3 mg/dL (ref 1.7–2.4)

## 2022-01-01 LAB — PHOSPHORUS: Phosphorus: 4.5 mg/dL (ref 2.5–4.6)

## 2022-01-01 LAB — LACTIC ACID, PLASMA
Lactic Acid, Venous: 1.6 mmol/L (ref 0.5–1.9)
Lactic Acid, Venous: 1.1 mmol/L (ref 0.5–1.9)

## 2022-01-01 IMAGING — DX DG ABD PORTABLE 1V
2 series · 2 of 2 positions shown · non-contrast
Comparison: Abdominal radiographs done on [DATE], CT done on
[DATE]

CLINICAL DATA: Diverticulitis

EXAM:
PORTABLE ABDOMEN - 1 VIEW

[abdomen kub (1 of 2)]
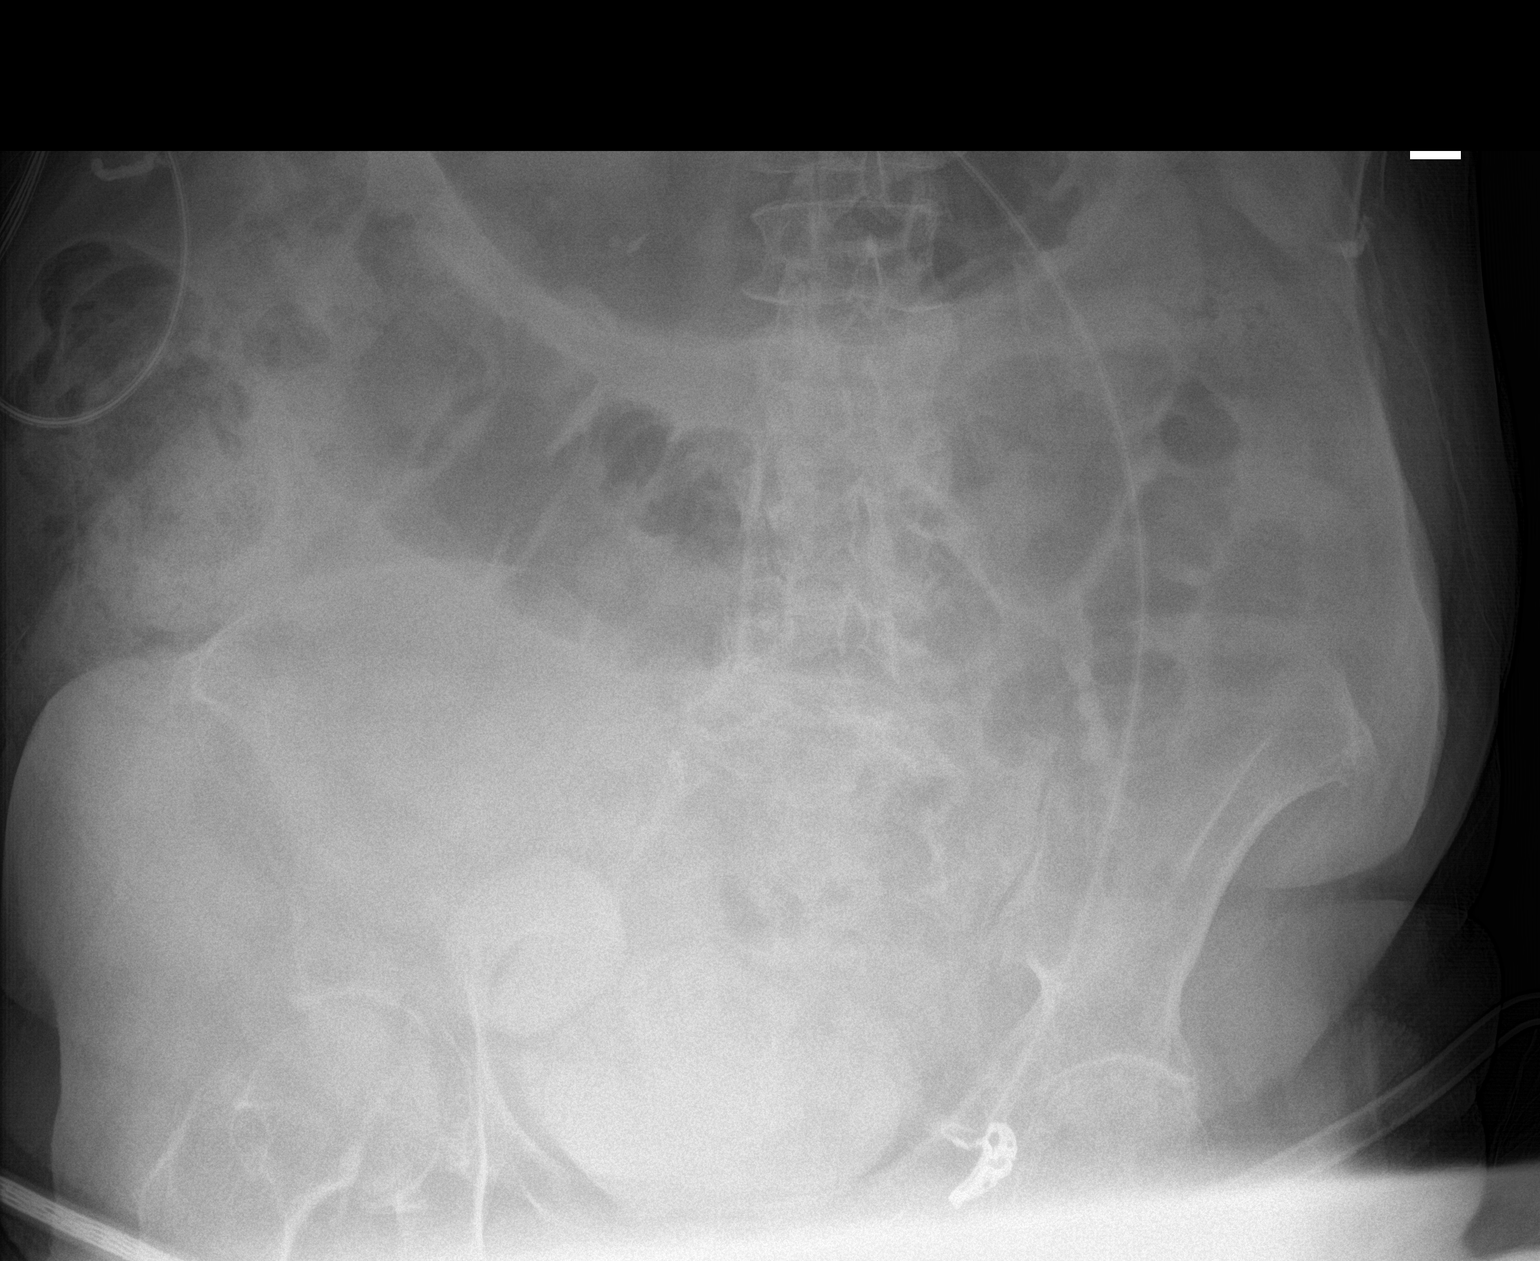

[abdomen kub (2 of 2)]
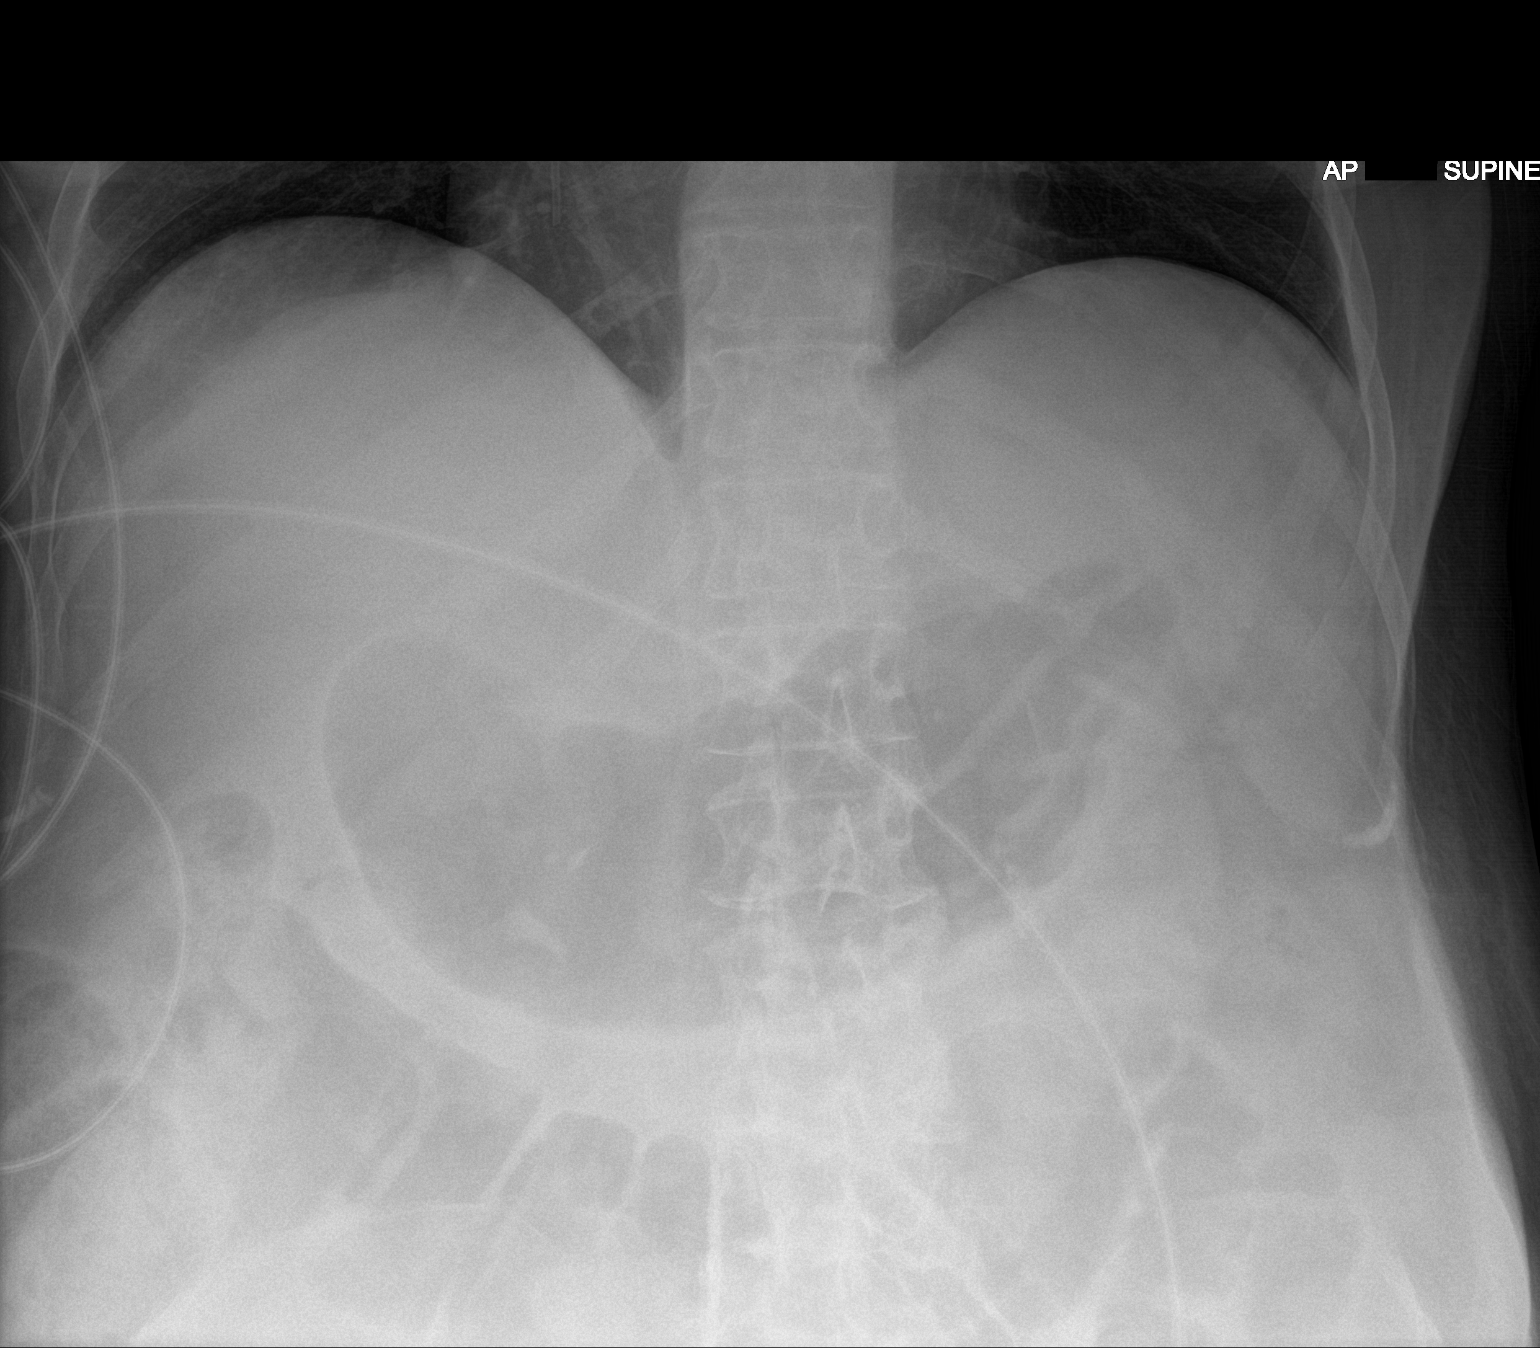

[2 of 2 positions shown; findings below may reference images not displayed]

FINDINGS: There is moderate gaseous distention of stomach. There is moderate
gaseous distention of transverse colon. There is no significant
small bowel dilation. There is residual contrast in the urinary
bladder. There is 4.6 cm bladder diverticulum in the right side of
the urinary bladder with contrast in the lumen.
IMPRESSION: There is moderate gaseous distention of stomach and transverse
colon, possibly suggesting ileus. There is possible urinary bladder
diverticulum.

## 2022-01-01 MED ORDER — SODIUM CHLORIDE 0.9 % IV SOLN: INTRAVENOUS | Status: DC

## 2022-01-01 MED ORDER — ACETAMINOPHEN 650 MG RE SUPP: 650.0000 mg | Freq: Four times a day (QID) | RECTAL | Status: DC | PRN

## 2022-01-01 MED ORDER — LEVOTHYROXINE SODIUM 25 MCG PO TABS
137.0000 ug | ORAL_TABLET | Freq: Every day | ORAL | Status: DC
  Administered 2022-01-02 – 2022-01-12 (×11): 137 ug via ORAL
  Filled 2022-01-01 (×12): qty 1

## 2022-01-01 MED ORDER — ACETAMINOPHEN 325 MG PO TABS
650.0000 mg | ORAL_TABLET | Freq: Four times a day (QID) | ORAL | Status: DC | PRN
  Filled 2022-01-01: qty 2

## 2022-01-01 MED ORDER — ENOXAPARIN SODIUM 40 MG/0.4ML IJ SOSY
40.0000 mg | PREFILLED_SYRINGE | INTRAMUSCULAR | Status: DC
Start: 2022-01-01 — End: 2022-01-03
  Administered 2022-01-01 – 2022-01-02 (×2): 40 mg via SUBCUTANEOUS
  Filled 2022-01-01 (×2): qty 0.4

## 2022-01-01 MED ORDER — HYDROMORPHONE HCL 1 MG/ML IJ SOLN
0.5000 mg | INTRAMUSCULAR | Status: DC | PRN
  Administered 2022-01-01 (×2): 0.5 mg via INTRAVENOUS
  Filled 2022-01-01 (×2): qty 0.5

## 2022-01-01 MED ORDER — UMECLIDINIUM BROMIDE 62.5 MCG/ACT IN AEPB
1.0000 | INHALATION_SPRAY | Freq: Every day | RESPIRATORY_TRACT | Status: DC
  Administered 2022-01-02 – 2022-01-12 (×11): 1 via RESPIRATORY_TRACT
  Filled 2022-01-01: qty 7

## 2022-01-01 MED ORDER — DAPTOMYCIN 500 MG IV SOLR
10.0000 mL | Freq: Every day | INTRAVENOUS | Status: DC
  Filled 2022-01-01: qty 10

## 2022-01-01 MED ORDER — BUPROPION HCL ER (XL) 150 MG PO TB24
300.0000 mg | ORAL_TABLET | Freq: Every morning | ORAL | Status: DC
  Administered 2022-01-02 – 2022-01-12 (×11): 300 mg via ORAL
  Filled 2022-01-01 (×11): qty 2

## 2022-01-01 MED ORDER — METRONIDAZOLE 500 MG/100ML IV SOLN
500.0000 mg | Freq: Two times a day (BID) | INTRAVENOUS | Status: AC
  Administered 2022-01-01 – 2022-01-09 (×17): 500 mg via INTRAVENOUS
  Filled 2022-01-01 (×17): qty 100

## 2022-01-01 MED ORDER — ALBUTEROL SULFATE (2.5 MG/3ML) 0.083% IN NEBU: 2.5000 mg | INHALATION_SOLUTION | Freq: Four times a day (QID) | RESPIRATORY_TRACT | Status: DC | PRN

## 2022-01-01 MED ORDER — DONEPEZIL HCL 10 MG PO TABS
10.0000 mg | ORAL_TABLET | Freq: Every day | ORAL | Status: DC
  Administered 2022-01-01 – 2022-01-11 (×11): 10 mg via ORAL
  Filled 2022-01-01 (×11): qty 1

## 2022-01-01 MED ORDER — SODIUM CHLORIDE 0.9% FLUSH
3.0000 mL | Freq: Two times a day (BID) | INTRAVENOUS | Status: DC
  Administered 2022-01-01 – 2022-01-12 (×20): 3 mL via INTRAVENOUS

## 2022-01-01 MED ORDER — HYDROMORPHONE HCL 1 MG/ML IJ SOLN
0.5000 mg | INTRAMUSCULAR | Status: DC | PRN
  Administered 2022-01-01 – 2022-01-03 (×7): 1 mg via INTRAVENOUS
  Administered 2022-01-03 (×2): 0.5 mg via INTRAVENOUS
  Administered 2022-01-04: 1 mg via INTRAVENOUS
  Administered 2022-01-04: 0.5 mg via INTRAVENOUS
  Administered 2022-01-04 – 2022-01-07 (×10): 1 mg via INTRAVENOUS
  Administered 2022-01-09 (×2): 0.5 mg via INTRAVENOUS
  Filled 2022-01-01 (×4): qty 1
  Filled 2022-01-01: qty 0.5
  Filled 2022-01-01 (×3): qty 1
  Filled 2022-01-01: qty 0.5
  Filled 2022-01-01 (×2): qty 1
  Filled 2022-01-01: qty 0.5
  Filled 2022-01-01: qty 1
  Filled 2022-01-01 (×2): qty 0.5
  Filled 2022-01-01 (×9): qty 1

## 2022-01-01 MED ORDER — SODIUM CHLORIDE 0.9 % IV SOLN
2.0000 g | INTRAVENOUS | Status: AC
  Administered 2022-01-01 – 2022-01-09 (×8): 2 g via INTRAVENOUS
  Filled 2022-01-01 (×8): qty 20

## 2022-01-01 MED ORDER — BUSPIRONE HCL 5 MG PO TABS
5.0000 mg | ORAL_TABLET | Freq: Two times a day (BID) | ORAL | Status: DC
  Administered 2022-01-01 – 2022-01-12 (×22): 5 mg via ORAL
  Filled 2022-01-01 (×22): qty 1

## 2022-01-01 MED ORDER — MIRTAZAPINE 15 MG PO TABS
30.0000 mg | ORAL_TABLET | Freq: Every day | ORAL | Status: DC
  Administered 2022-01-01 – 2022-01-11 (×11): 30 mg via ORAL
  Filled 2022-01-01 (×11): qty 2

## 2022-01-01 MED ORDER — PIPERACILLIN-TAZOBACTAM 3.375 G IVPB
3.3750 g | Freq: Three times a day (TID) | INTRAVENOUS | Status: DC
  Filled 2022-01-01 (×2): qty 50

## 2022-01-01 MED ORDER — PIPERACILLIN-TAZOBACTAM 3.375 G IVPB 30 MIN
3.3750 g | Freq: Three times a day (TID) | INTRAVENOUS | Status: DC
Start: 2022-01-01 — End: 2022-01-01

## 2022-01-01 MED ORDER — SODIUM CHLORIDE 0.9 % IV SOLN
8.0000 mg/kg | Freq: Every day | INTRAVENOUS | Status: DC
  Administered 2022-01-01 – 2022-01-11 (×11): 500 mg via INTRAVENOUS
  Filled 2022-01-01 (×13): qty 10

## 2022-01-01 MED ORDER — ALPRAZOLAM 0.5 MG PO TABS
0.5000 mg | ORAL_TABLET | Freq: Two times a day (BID) | ORAL | Status: DC | PRN
  Administered 2022-01-02 – 2022-01-10 (×6): 0.5 mg via ORAL
  Filled 2022-01-01 (×6): qty 1

## 2022-01-01 MED ORDER — ATORVASTATIN CALCIUM 10 MG PO TABS
20.0000 mg | ORAL_TABLET | Freq: Every day | ORAL | Status: DC
  Administered 2022-01-01 – 2022-01-11 (×11): 20 mg via ORAL
  Filled 2022-01-01 (×11): qty 2

## 2022-01-01 MED ORDER — FLUTICASONE-UMECLIDIN-VILANT 100-62.5-25 MCG/INH IN AEPB: 1.0000 | INHALATION_SPRAY | Freq: Every day | RESPIRATORY_TRACT | Status: DC

## 2022-01-01 MED ORDER — PANTOPRAZOLE SODIUM 40 MG PO TBEC
40.0000 mg | DELAYED_RELEASE_TABLET | Freq: Two times a day (BID) | ORAL | Status: DC
  Administered 2022-01-01 – 2022-01-12 (×22): 40 mg via ORAL
  Filled 2022-01-01 (×22): qty 1

## 2022-01-01 MED ORDER — FLUTICASONE FUROATE-VILANTEROL 100-25 MCG/ACT IN AEPB
1.0000 | INHALATION_SPRAY | Freq: Every day | RESPIRATORY_TRACT | Status: DC
  Administered 2022-01-02 – 2022-01-12 (×11): 1 via RESPIRATORY_TRACT
  Filled 2022-01-01: qty 28

## 2022-01-01 NOTE — Assessment & Plan Note (Signed)
-  Continue CPAP nightly per RT ?

## 2022-01-01 NOTE — Assessment & Plan Note (Signed)
Continue Protonix °

## 2022-01-01 NOTE — Assessment & Plan Note (Signed)
Status post ORIF of the left tibial plateau after being found to have concern osteomyelitis secondary to MRSA.  Patient had been started on daptomycin IV through PICC line 3 days ago by Dr. Drucilla Schmidt.  Metastatic cancer was not ruled out completely per infectious disease. ?-Continue daptomycin ?

## 2022-01-01 NOTE — Consult Note (Addendum)
Reason for Consult: diverticulitis ?Referring Physician: Fuller Plan, MD ? ?Holly [name] is an 76 y.o. female.  ?HPI:  ?Holly [name] is a 76 yo F who is transferred from [facility] with complicated diverticulitis.  Holly [name] medical history is complicated by bilateral lung cancer. Most recently, Holly [name] had what was felt to be a pathologic fracture of the left tibial plateau. Dr. Doreatha Martin took Holly [name] to the OR for ORIF and biopsy 3 weeks ago.  Path was negative for cancer, but was positive for MRSA. Dr. Drucilla Schmidt is treating Holly [name] with Daptomycin.  ? ?Holly [name] went to the [facility] with severe abdominal pain last night.  The patient had around 2 weeks of abdominal pain.  Holly [name] had had constipation for part of that time.  However, around 2 days ago the pain started to get worse.  Holly [name] had multiple runny stools.  Holly [name] had temperatures of around 100 degrees, but no higher.  Holly [name] did have some nausea but no vomiting.  Holly [name] has had decreased appetite.  When the pain became excruciating last night, Holly [name] went to the emergency department. ? ? Imaging was positive for contained perforation and fluid collection x 2 adjacent to the sigmoid colon.  Holly [name] has had a colonoscopy in the past 3 to 5 years.  This was negative for malignancy. ? ?Patient has COPD and is on 3 L of oxygen at home. ? ?Past Medical History:  ?Diagnosis Date  ? Anemia   ? blood transfusion November 2022  ? Anxiety   ? Asthma   ? Basal cell carcinoma   ? lung cancer.  Skin cancer- basal - nose removed  ? Bruises easily   ? Cataract   ? Cervicalgia   ? COPD (chronic obstructive pulmonary disease) ([place])   ? emphysema and COPD  ? Depression   ? Dyspnea   ? uses O2 only when needed. Uses 2 L   ? Foot swelling   ? left- has shown to Drs.  ? Frequency of urination   ? Hardware complicating wound infection ([place]) 12/24/2021  ? Headache   ? History of skin cancer   ? 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 Hypercalcemia   ? Hypertension   ? Hypothyroidism   ? Mixed hyperlipidemia   ? MRSA infection 12/24/2021   ? 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 Pneumonia   ? Pulmonary nodule   ? Sleep apnea   ? Holly [name] uses a cpap nightly  ? Thyroid neoplasm   ? Urinary incontinence   ? Wears dentures   ? Wears glasses   ? ? ?Past Surgical History:  ?Procedure Laterality Date  ? BIOPSY  02/17/2021  ? Procedure: BIOPSY;  Surgeon: Otis Brace, MD;  Location: [facility];  Service: Gastroenterology;;  ? BONE BIOPSY Left 12/13/2021  ? Procedure: BONE BIOPSY;  Surgeon: Shona Needles, MD;  Location: [facility];  Service: Orthopedics;  Laterality: Left;  ? COLONOSCOPY    ? DENTAL SURGERY    ? ELBOW ARTHROPLASTY Right 2020  ? ESOPHAGOGASTRODUODENOSCOPY (EGD) WITH PROPOFOL N/A 02/17/2021  ? Procedure: ESOPHAGOGASTRODUODENOSCOPY (EGD) WITH PROPOFOL;  Surgeon: Otis Brace, MD;  [facility];  Service: Gastroenterology;  Laterality: N/A;  ? EYE SURGERY Right   ? cataract surgery  ? FOOT SURGERY  30 YRS AGO  ? BILATERAL  ? HARDWARE REMOVAL Right 10/02/2020  ? Procedure: HARDWARE REMOVAL RIGHT ELBOW;  Surgeon: Shona Needles, MD;  Location: [facility];  Service: Orthopedics;  Laterality: Right;  ? I & D  EXTREMITY Right 01/13/2021  ? Procedure: IRRIGATION AND DEBRIDEMENT RIGHT ELBOW;  Surgeon: Shona Needles, MD;  Location: [facility];  Service: Orthopedics;  Laterality: Right;  ? ORIF ELBOW FRACTURE Right 12/06/2017  ? Procedure: OPEN REDUCTION INTERNAL FIXATION (ORIF) ELBOW/OLECRANON FRACTURE;  Surgeon: Shona Needles, MD;  Location: [facility];  Service: Orthopedics;  Laterality: Right;  ? ORIF TIBIA PLATEAU Left 12/13/2021  ? Procedure: OPEN REDUCTION INTERNAL FIXATION (ORIF) TIBIAL PLATEAU;  Surgeon: Shona Needles, MD;  Location: [place] [facility];  Service: Orthopedics;  Laterality: Left;  ? SKIN CANCER EXCISION    ? THYROIDECTOMY N/A 07/31/2014  ? Procedure: TOTAL THYROIDECTOMY;  Surgeon: Armandina Gemma, MD;  Location: [facility];  Service: General;  Laterality: N/A;  ? VAGINAL HYSTERECTOMY    ? VIDEO BRONCHOSCOPY WITH ENDOBRONCHIAL NAVIGATION Bilateral  06/25/2019  ? Procedure: VIDEO BRONCHOSCOPY WITH ENDOBRONCHIAL NAVIGATION;  Surgeon: Lajuana Matte, MD;  Location: [facility];  Service: Thoracic;  Laterality: Bilateral;  ? ? ?Family History  ?Problem Relation Age of Onset  ? Coronary artery disease Father   ? Hypertension Father   ? Asthma Father   ? Allergies Father   ? Hypertension Mother   ? Lung cancer Mother   ? ? ?Social History:  reports that Holly [name] quit smoking about 6 years ago. Holly [name] smoking use included cigarettes. Holly [name] has a 30.00 pack-year smoking history. Holly [name] has never used smokeless tobacco. Holly [name] reports current alcohol use. Holly [name] reports that Holly [name] does not use drugs. ? ?Allergies:  ?Allergies  ?Allergen Reactions  ? Amlodipine Besylate Swelling  ?  Edema  ? Codeine Nausea And Vomiting  ? Oxycodone-Acetaminophen Nausea And Vomiting  ? ? ?Medications:  ?Current Meds  ?Medication Sig  ? acetaminophen (TYLENOL) 650 MG CR tablet Take 650 mg by mouth every 8 (eight) hours as needed for pain.  ? albuterol (PROVENTIL) (2.5 MG/3ML) 0.083% nebulizer solution Take 2.5 mg by nebulization 3 (three) times daily as needed for wheezing or shortness of breath.  ? ALPRAZolam (XANAX) 0.5 MG tablet Take 0.5 mg by mouth 2 (two) times daily.  ? aspirin 325 MG tablet Take 1 tablet (325 mg total) by mouth in the morning and at bedtime.  ? atorvastatin (LIPITOR) 20 MG tablet Take 20 mg by mouth at bedtime.   ? buPROPion (WELLBUTRIN XL) 300 MG 24 hr tablet Take 300 mg by mouth every morning.  ? busPIRone (BUSPAR) 5 MG tablet Take 1 tablet (5 mg total) by mouth 3 (three) times daily. (Patient taking differently: Take 5 mg by mouth 2 (two) times daily.)  ? DAPTOmycin (CUBICIN) 500 MG injection Inject 10 mLs into the vein daily.  ? donepezil (ARICEPT) 10 MG tablet Take 10 mg by mouth at bedtime.  ? Fluticasone-Umeclidin-Vilant 100-62.5-25 MCG/INH AEPB Inhale 1 puff into the lungs daily. Trelegy  ? furosemide (LASIX) 20 MG tablet Take 1 tablet (20 mg total) by mouth every other day.  (Patient taking differently: Take 20 mg by mouth 2 (two) times daily.)  ? levothyroxine (SYNTHROID) 137 MCG tablet Take 137 mcg by mouth daily before breakfast.  ? losartan (COZAAR) 50 MG tablet Take 1 tablet (50 mg total) by mouth daily. (Patient taking differently: Take 50 mg by mouth at bedtime.)  ? metoprolol succinate (TOPROL-XL) 50 MG 24 hr tablet Take 50 mg by mouth daily. Take with or immediately following a meal.  ? mirtazapine (REMERON) 30 MG tablet Take 30 mg by mouth at bedtime.  ? ondansetron (ZOFRAN) 8 MG tablet Take 8 mg  by mouth 3 (three) times daily.  ? pantoprazole (PROTONIX) 40 MG tablet Take 1 tablet (40 mg total) by mouth daily. (Patient taking differently: Take 40 mg by mouth 2 (two) times daily.)  ? polyethylene glycol (MIRALAX / GLYCOLAX) 17 g packet Take 17 g by mouth daily as needed for moderate constipation. (Patient taking differently: Take 17 g by mouth daily.)  ? predniSONE (DELTASONE) 20 MG tablet Take 20 mg by mouth daily. continuous  ? traMADol (ULTRAM) 50 MG tablet Take 50-100 mg by mouth every 6 (six) hours as needed for severe pain or moderate pain.  ? VENTOLIN HFA 108 (90 Base) MCG/ACT inhaler INHALE 2 PUFFS INTO THE LUNGS EVERY 4 HOURS AS NEEDED FOR WHEEZING ORSHORTNESS OF BREATH (Patient taking differently: Inhale 2 puffs into the lungs every 4 (four) hours as needed for wheezing or shortness of breath.)  ? vitamin C (ASCORBIC ACID) 500 MG tablet Take 500 mg by mouth daily.  ? Vitamin D, Ergocalciferol, (DRISDOL) 1.25 MG (50000 UNIT) CAPS capsule Take 1 capsule (50,000 Units total) by mouth every 7 (seven) days. (Patient taking differently: Take 50,000 Units by mouth every 7 (seven) days. Tuesdays)  ? ? ? ?Results for orders placed or performed during the hospital encounter of 01/01/22 (from the past 48 hour(s))  ?CBC WITH DIFFERENTIAL     Status: Abnormal  ? Collection Time: 01/01/22 12:22 PM  ?Result Value Ref Range  ? WBC 27.7 (H) 4.0 - 10.5 K/uL  ? RBC 2.96 (L) 3.87 - 5.11  MIL/uL  ? Hemoglobin 8.4 (L) 12.0 - 15.0 g/dL  ? HCT 28.2 (L) 36.0 - 46.0 %  ? MCV 95.3 80.0 - 100.0 fL  ? MCH 28.4 26.0 - 34.0 pg  ? MCHC 29.8 (L) 30.0 - 36.0 g/dL  ? RDW 17.4 (H) 11.5 - 15.5 %  ? Plate

## 2022-01-01 NOTE — Assessment & Plan Note (Signed)
Patient is chronic oxygen of 3 L at baseline. ?-Continue nasal cannula oxygen at 3 L ?

## 2022-01-01 NOTE — Consult Note (Signed)
?   ? ? ? ? ?[name] for Infectious Disease   ? ?Date of Admission:  01/01/2022    ? ?Reason for Consult: osteomyelitis, complicated diverticulitis    ?Referring Provider: Fuller Plan ? ? ? ?Lines:  ?3/15-c rue picc single lumen ? ?Abx: ?3/18-c ceftriaxone/flagyl ?3/15-c daptomycin      ? ?3/18 ertapenem-->piptazo ? ?Assessment: ?76 yo female copd, nsclc rml and LUL, left tibial plateau pathological fracture s/p orif 2/27 with biopsy/culture --> infected with mrsa (biopsy no malignant cell), admitted 3/66 for complicated diverticulitis with 5 cm abscess intraabd ? 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 Previously on doxy ? ? ?Will continue daptomycin to finish the 6 weeks from start date 3/15 until 4/26. Will need chronic suppression abx likely with doxycycline after that end date ?I agree with Dr Tommy Medal that metastatic cancer in the knee is not ruled out ? ? ?#complicated diverticulitis ?She was seen at [facility] for abd pain and found to have 5 cm abscess around ruptured diverticulitis ?Surgery evaluated and recommended IR drain placement ?She was started on ertapenem at [facility] but changed to piptazo here ? ?Due to age, would avoid high salt load with piptazo. Ceftriaxone/flagyl would be fine. But suspect after IR drain placement, if culture amenable could do amox-clav  ? ? ? ?Plan: ?Agree with IR drain placement into abscess; please send for culture ?Continue daptomycin for left tibial osteomyelitis ?Change piptazo to ceftriaxone 2 gram iv daily and metronidazole 500 mg po bid ?Will follow culture and adjust abx ?Cbc/cmp/crp/ck here q72hours  ? ? ? ? ?------------------------------------------------ ?Principal Problem: ?  Diverticulitis of intestine with perforation ?Active Problems: ?  Chronic respiratory failure with hypoxia  (HCC) ? ? ? ?HPI: Holly [name] is a 76 y.o. female nsclc rml and lul, recent pathologic left tibial plateau fx complicated with mrsa infection on dapto, admitted in transfer from [facility] for complicated diverticulitis ? ?Patient had a few days abd pain and was seen at [facility]. Imaging there showed complicated diverticulitis with 5 cm abscess. Surgery evaluated and recommended IR drain placement ?Her hemodynamics without concern on transfer ?She is afebrile ?Her wbc is 28 ? ?She was recently started on daptomycin for the left tibial osteomyelitis. There was concern for metastasis from lung cancer. She underwent biopsy/orif 2/27 with dr Doreatha Martin --> no malignant cells; rare mrsa found. She was started on doxy and ultimately transitioned to dapto on 3/15 when she saw Dr Tommy Medal. She is doing well from that perspective ? ?She had distant hx of right elbow OM/hardware related mrsa infection but all hardware removed/tx appropriately by 12/2020 ? ?She is currently feeling ok but still with moderate abdominal pain ?No n/v ?No rash ?Left knee is doing quite well ? ?No cough/chest pain/sob ? ? ?Family History  ?Problem Relation Age of Onset  ? Coronary artery disease Father   ? Hypertension Father   ? Asthma Father   ? Allergies Father   ? Hypertension Mother   ? Lung cancer Mother   ? ? ?Social History  ? ?Tobacco Use  ? Smoking status: Former  ?  Packs/day: 1.00  ?  Years: 30.00  ?  Pack years: 30.00  ?  Types: Cigarettes  ?  Quit date: 02/24/2015  ?  Years since quitting: 6.8  ? Smokeless tobacco: Never  ?  Vaping Use  ? Vaping Use: Former  ?Substance Use Topics  ? Alcohol use: Yes  ?  Alcohol/week: 0.0 standard drinks  ?  Comment: very seldom  ? Drug use: Never  ? ? ?Allergies  ?Allergen Reactions  ? Amlodipine Besylate Swelling  ?  Edema  ? Codeine Nausea And Vomiting  ? Oxycodone-Acetaminophen Nausea And Vomiting  ? ? ?Review of Systems: ?ROS ?All Other ROS was negative, except mentioned above ? ? ?Past Medical History:   ?Diagnosis Date  ? Anemia   ? blood transfusion November 2022  ? Anxiety   ? Asthma   ? Basal cell carcinoma   ? lung cancer.  Skin cancer- basal - nose removed  ? Bruises easily   ? Cataract   ? Cervicalgia   ? COPD (chronic obstructive pulmonary disease) ([place])   ? emphysema and COPD  ? Depression   ? Dyspnea   ? uses O2 only when needed. Uses 2 L   ? Foot swelling   ? left- has shown to Drs.  ? Frequency of urination   ? Hardware complicating wound infection ([place]) 12/24/2021  ? Headache   ? History of skin cancer   ? 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 Hypercalcemia   ? Hypertension   ? Hypothyroidism   ? Mixed hyperlipidemia   ? MRSA infection 12/24/2021  ? 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 Pneumonia   ? Pulmonary nodule   ? Sleep apnea   ? pt uses a cpap nightly  ? Thyroid neoplasm   ? Urinary incontinence   ? Wears dentures   ? Wears glasses   ? ? ? ? ? ?Scheduled Meds: ? DAPTOmycin  10 mL Intravenous Daily  ? enoxaparin (LOVENOX) injection  40 mg Subcutaneous Q24H  ? Fluticasone-Umeclidin-Vilant  1 puff Inhalation Daily  ? sodium chloride flush  3 mL Intravenous Q12H  ? ?Continuous Infusions: ? sodium chloride 100 mL/hr at 01/01/22 1407  ? cefTRIAXone (ROCEPHIN)  IV    ? metronidazole    ? ?PRN Meds:.acetaminophen **OR** acetaminophen, albuterol, HYDROmorphone (DILAUDID) injection ? ? ?OBJECTIVE: ?Blood pressure (!) 97/57, pulse (!) 103, temperature 97.8 ?F (36.6 ?C), temperature source Oral, resp. rate 20, SpO2 97 %. ? ?Physical Exam ?General/constitutional: no distress, pleasant ?HEENT: Normocephalic, PER, Conj Clear, EOMI, Oropharynx clear ?Neck supple ?CV: rrr no mrg ?Lungs: clear to auscultation, normal respiratory effort ?Abd: tender left side >right side; no rebound/guarding ?Ext: no edema ?Skin: No Rash ?Neuro: nonfocal ?MSK: left knee incision healed; no swelling/redness/dehiscence ?Psych: alert/oriented ? ? ? ?Lab Results ?Lab Results  ?Component Value Date  ? WBC 27.7 (H) 01/01/2022  ? HGB 8.4 (L)  01/01/2022  ? HCT 28.2 (L) 01/01/2022  ? MCV 95.3 01/01/2022  ? PLT 284 01/01/2022  ?  ?Lab Results  ?Component Value Date  ? CREATININE 1.29 (H) 01/01/2022  ? BUN 20 01/01/2022  ? NA 138 01/01/2022  ? K 4.3 01/01/2022  ? CL 98 01/01/2022  ? CO2 32 01/01/2022  ?  ?Lab Results  ?Component Value Date  ? ALT 9 01/01/2022  ? AST 11 (L) 01/01/2022  ? ALKPHOS 82 01/01/2022  ? BILITOT 0.4 01/01/2022  ?  ? ? ?Microbiology: ?No results found for this or any previous visit (from the past 240 hour(s)). ? ? ?Serology: ? ? ? ?Imaging: ?If present, new imagings (plain films, ct scans, and mri) have been personally visualized and interpreted; radiology reports have been reviewed. Decision making incorporated into the Impression /  Recommendations. ? ? ? ?Jabier Mutton, MD ?[facility] ?[place] ?[phone] pager    ?01/01/2022, 2:10 PM ? ?

## 2022-01-01 NOTE — Progress Notes (Signed)
Pt set up on cpap for the night. ?

## 2022-01-01 NOTE — Assessment & Plan Note (Addendum)
Patient is not currently receiving chemotherapy or radiation.  She confirms DO NOT RESUSCITATE order should remain in place.  Patient is being followed by palliative care in the outpatient setting. ?-May consider palliative care consult in the inpatient setting ?

## 2022-01-01 NOTE — Assessment & Plan Note (Deleted)
Patient is chronic oxygen of 3 L at baseline. ?-Continue nasal cannula oxygen at 3 L ?

## 2022-01-01 NOTE — Assessment & Plan Note (Signed)
-  Continue BuSpar, Wellbutrin, and Xanax as needed ?

## 2022-01-01 NOTE — H&P (Signed)
?History and Physical  ? ? ?Patient: Holly [name] QMV:[id number] DOB: 1946/09/24 ?DOA: 01/01/2022 ?DOS: the patient was seen and examined on 01/01/2022 ?PCP: Harlan Stains, MD  ?Patient coming from: [place] transfer ? ?Chief Complaint: Abdominal pain ? ?HPI: Holly [name] is a 76 y.o. female with medical history significant of hypertension, dyslipidemia, COPD on 3 L of oxygen, hypothyroidism, chronic anemia, anxiety, and cancer(melanoma of the nose, basal cell carcinoma, NSCLC, and thyroid neoplasm) presented to [facility] with complaints of abdominal pain.  She just underwent open reduction and internal fixation of the of the tibial plateau on 2/27 after CT scan revealed tibial plateau fracture with multiple areas of lytic bone lesions.  Interoperatively, biopsies came back negative for malignancy, but were positive for MRSA.  Patient had been placed on doxycycline for approximately 3 weeks and had PICC line placed 3 days ago to receive IV daptomycin per ID recommendations.  She complains of having generalized abdominal pain which started approximately 6 ago, and became severe to the point in which she could not move yesterday.  Noted associated symptoms of poor appetite, loose stools, grossly worsening abdominal distention, and fevers up to 100 ?F at home.  She had been taking Tylenol for fever symptoms. ? ?Patient was initially seen at [facility] yesterday noted to be afebrile with white blood cell count 14, lactic acid 4.3, hemoglobin 8.7 BUN 27, creatinine 1.3, glucose 256, and albumin 3.1.  COVID-19 screening was negative CT scan of the abdomen pelvis significant for sigmoid diverticulitis with likely contained perforation with complex 5.4 x 5.6 cm gas and fluid collection as well as suggestion of a 5.6 x 3.5 cm developing sigmoid organized fluid collection.  Patient having given IV fluids, Tylenol, pain medications, and started on ertapenem. ? ? ?Review of Systems: As mentioned in the history of present  illness. All other systems reviewed and are negative. ?Past Medical History:  ?Diagnosis Date  ? Anemia   ? blood transfusion November 2022  ? Anxiety   ? Asthma   ? Basal cell carcinoma   ? lung cancer.  Skin cancer- basal - nose removed  ? Bruises easily   ? Cataract   ? Cervicalgia   ? COPD (chronic obstructive pulmonary disease) ([place])   ? emphysema and COPD  ? Depression   ? Dyspnea   ? uses O2 only when needed. Uses 2 L   ? Foot swelling   ? left- has shown to Drs.  ? Frequency of urination   ? Hardware complicating wound infection ([place]) 12/24/2021  ? Headache   ? History of skin cancer   ? 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 Hypercalcemia   ? Hypertension   ? Hypothyroidism   ? Mixed hyperlipidemia   ? MRSA infection 12/24/2021  ? 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 Pneumonia   ? Pulmonary nodule   ? Sleep apnea   ? pt uses a cpap nightly  ? Thyroid neoplasm   ? Urinary incontinence   ? Wears dentures   ? Wears glasses   ? ?Past Surgical History:  ?Procedure Laterality Date  ? BIOPSY  02/17/2021  ? Procedure: BIOPSY;  Surgeon: Otis Brace, MD;  Location: [facility];  Service: Gastroenterology;;  ? BONE BIOPSY Left 12/13/2021  ? Procedure: BONE BIOPSY;  Surgeon: Shona Needles, MD;  Location: [facility];  Service: Orthopedics;  Laterality: Left;  ? COLONOSCOPY    ? DENTAL SURGERY    ? ELBOW ARTHROPLASTY Right 2020  ? ESOPHAGOGASTRODUODENOSCOPY (  EGD) WITH PROPOFOL N/A 02/17/2021  ? Procedure: ESOPHAGOGASTRODUODENOSCOPY (EGD) WITH PROPOFOL;  Surgeon: Otis Brace, MD;  [facility]: [facility];  Service: Gastroenterology;  Laterality: N/A;  ? EYE SURGERY Right   ? cataract surgery  ? FOOT SURGERY  30 YRS AGO  ? BILATERAL  ? HARDWARE REMOVAL Right 10/02/2020  ? Procedure: HARDWARE REMOVAL RIGHT ELBOW;  Surgeon: Shona Needles, MD;  Location: [place];  Service: Orthopedics;  Laterality: Right;  ? I & D EXTREMITY Right 01/13/2021  ? Procedure: IRRIGATION AND DEBRIDEMENT RIGHT ELBOW;  Surgeon: Shona Needles, MD;   Location: [place];  Service: Orthopedics;  Laterality: Right;  ? ORIF ELBOW FRACTURE Right 12/06/2017  ? Procedure: OPEN REDUCTION INTERNAL FIXATION (ORIF) ELBOW/OLECRANON FRACTURE;  Surgeon: Shona Needles, MD;  Location: [facility];  Service: Orthopedics;  Laterality: Right;  ? ORIF TIBIA PLATEAU Left 12/13/2021  ? Procedure: OPEN REDUCTION INTERNAL FIXATION (ORIF) TIBIAL PLATEAU;  Surgeon: Shona Needles, MD;  Location: [facility];  Service: Orthopedics;  Laterality: Left;  ? SKIN CANCER EXCISION    ? THYROIDECTOMY N/A 07/31/2014  ? Procedure: TOTAL THYROIDECTOMY;  Surgeon: Armandina Gemma, MD;  Location: [facility];  Service: General;  Laterality: N/A;  ? VAGINAL HYSTERECTOMY    ? VIDEO BRONCHOSCOPY WITH ENDOBRONCHIAL NAVIGATION Bilateral 06/25/2019  ? Procedure: VIDEO BRONCHOSCOPY WITH ENDOBRONCHIAL NAVIGATION;  Surgeon: Lajuana Matte, MD;  Location: [place];  Service: Thoracic;  Laterality: Bilateral;  ? ?Social History:  reports that she quit smoking about 6 years ago. Her smoking use included cigarettes. She has a 30.00 pack-year smoking history. She has never used smokeless tobacco. She reports current alcohol use. She reports that she does not use drugs. ? ?Allergies  ?Allergen Reactions  ? Amlodipine Besylate Swelling  ?  Edema  ? Codeine Nausea And Vomiting  ? Oxycodone-Acetaminophen Nausea And Vomiting  ? ? ?Family History  ?Problem Relation Age of Onset  ? Coronary artery disease Father   ? Hypertension Father   ? Asthma Father   ? Allergies Father   ? Hypertension Mother   ? Lung cancer Mother   ? ? ?Prior to Admission medications   ?Medication Sig Start Date End Date Taking? Authorizing Provider  ?acetaminophen (TYLENOL) 325 MG tablet Take 650 mg by mouth every 6 (six) hours as needed for mild pain.    [provider]  ?ALPRAZolam Duanne Moron) 0.5 MG tablet Take 0.5 mg by mouth 2 (two) times daily. 09/14/20   [provider]  ?aspirin 325 MG tablet Take 1 tablet (325 mg total) by mouth in the morning and  at bedtime. 12/14/21 01/13/22  Corinne Ports, PA-C  ?atorvastatin (LIPITOR) 20 MG tablet Take 20 mg by mouth at bedtime.     [provider]  ?buPROPion (WELLBUTRIN XL) 300 MG 24 hr tablet Take 300 mg by mouth every morning. 07/08/21   [provider]  ?busPIRone (BUSPAR) 5 MG tablet Take 1 tablet (5 mg total) by mouth 3 (three) times daily. ?Patient taking differently: Take 5 mg by mouth 2 (two) times daily. 08/31/21   Oswald Hillock, MD  ?Docusate Sodium (COLACE PO) Take 50 mg by mouth 2 (two) times daily.    [provider]  ?donepezil (ARICEPT) 10 MG tablet Take 10 mg by mouth at bedtime. 12/17/20   [provider]  ?doxycycline (VIBRA-TABS) 100 MG tablet Take 1 tablet (100 mg total) by mouth 2 (two) times daily. To be taken until the IV antibiotics are started and  to be resumed when they are stopped 12/24/21   Tommy Medal, Lavell Islam, MD  ?ferrous sulfate 325 (65 FE) MG tablet Take 325 mg by mouth 2 (two) times daily with a meal.    [provider]  ?Fluticasone-Umeclidin-Vilant 100-62.5-25 MCG/INH AEPB Inhale 1 puff into the lungs daily. Trelegy    [provider]  ?furosemide (LASIX) 20 MG tablet Take 1 tablet (20 mg total) by mouth every other day. ?Patient taking differently: Take 40 mg by mouth daily. 08/31/21   Oswald Hillock, MD  ?levothyroxine (SYNTHROID) 137 MCG tablet Take 137 mcg by mouth daily before breakfast. 09/22/20   [provider]  ?losartan (COZAAR) 50 MG tablet Take 1 tablet (50 mg total) by mouth daily. ?Patient taking differently: Take 50 mg by mouth at bedtime. 08/03/21   Oswald Hillock, MD  ?metoprolol succinate (TOPROL-XL) 50 MG 24 hr tablet Take 50 mg by mouth daily. Take with or immediately following a meal.    [provider]  ?mirtazapine (REMERON) 30 MG tablet Take 30 mg by mouth at bedtime. 12/03/21   [provider]  ?ondansetron (ZOFRAN) 4 MG tablet Take 1 tablet (4 mg total) by mouth every 8 (eight) hours as  needed for nausea or vomiting. ?Patient taking differently: Take 8 mg by mouth every 8 (eight) hours as needed for nausea or vomiting. 08/31/21 08/31/22  Oswald Hillock, MD  ?oxyCODONE (OXY IR/ROXICODONE) 5 MG

## 2022-01-01 NOTE — Assessment & Plan Note (Addendum)
Patient presented with complaints of abdominal pain with reports of loose stools.  Found to have diverticulitis with concern for perforations on CT imaging at [facility] started on ertapenem. ?-Admit to a progressive bed ?-N.p.o. ?-Rocephin and metronidazole IV per ID recommendations orders for Zosyn were discontinued ?-IV fluids ?-IV Dilaudid as needed for pain ?-IR consulted for possible need of drainage down the rib ?-Appreciate general surgery consultative services, follow-up for any further recommendations ?

## 2022-01-01 NOTE — Assessment & Plan Note (Signed)
Continue levothyroxine 

## 2022-01-01 NOTE — Assessment & Plan Note (Addendum)
-  Continue inhalers and breathing treatments as needed ?

## 2022-01-01 NOTE — Assessment & Plan Note (Signed)
On admission creatinine 1.29.  This appears near patient's baseline. ?-Continue to monitor kidney function ?

## 2022-01-02 ENCOUNTER — Inpatient Hospital Stay (HOSPITAL_COMMUNITY): Payer: Medicare Other

## 2022-01-02 DIAGNOSIS — J42 Unspecified chronic bronchitis: Secondary | ICD-10-CM

## 2022-01-02 DIAGNOSIS — J9611 Chronic respiratory failure with hypoxia: Secondary | ICD-10-CM | POA: Diagnosis not present

## 2022-01-02 DIAGNOSIS — M869 Osteomyelitis, unspecified: Secondary | ICD-10-CM | POA: Diagnosis not present

## 2022-01-02 DIAGNOSIS — K5720 Diverticulitis of large intestine with perforation and abscess without bleeding: Secondary | ICD-10-CM | POA: Diagnosis not present

## 2022-01-02 LAB — BASIC METABOLIC PANEL
Anion gap: 7 (ref 5–15)
BUN: 21 mg/dL (ref 8–23)
CO2: 30 mmol/L (ref 22–32)
Calcium: 9.3 mg/dL (ref 8.9–10.3)
Chloride: 98 mmol/L (ref 98–111)
Creatinine, Ser: 1.14 mg/dL — ABNORMAL HIGH (ref 0.44–1.00)
GFR, Estimated: 50 mL/min — ABNORMAL LOW (ref >60)
Glucose, Bld: 116 mg/dL — ABNORMAL HIGH (ref 70–99)
Potassium: 4.2 mmol/L (ref 3.5–5.1)
Sodium: 135 mmol/L (ref 135–145)

## 2022-01-02 LAB — CBC
HCT: 25.8 % — ABNORMAL LOW (ref 36.0–46.0)
Hemoglobin: 7.3 g/dL — ABNORMAL LOW (ref 12.0–15.0)
MCH: 27 pg (ref 26.0–34.0)
MCHC: 28.3 g/dL — ABNORMAL LOW (ref 30.0–36.0)
MCV: 95.6 fL (ref 80.0–100.0)
Platelets: 225 10*3/uL (ref 150–400)
RBC: 2.7 MIL/uL — ABNORMAL LOW (ref 3.87–5.11)
RDW: 17.5 % — ABNORMAL HIGH (ref 11.5–15.5)
WBC: 22.5 10*3/uL — ABNORMAL HIGH (ref 4.0–10.5)
nRBC: 0 % (ref 0.0–0.2)

## 2022-01-02 LAB — CK: Total CK: <5 U/L — ABNORMAL LOW (ref 38–234)

## 2022-01-02 IMAGING — CT CT ABD-PELV W/ CM
2 of 5 series · 16 of 46 positions shown, 18 images · IV contrast (Omni 300)
Comparison: CT abdomen pelvis dated [DATE].

CLINICAL DATA: Perforated diverticulitis follow-up.

EXAM:
CT ABDOMEN AND PELVIS WITH CONTRAST
TECHNIQUE: Multidetector CT imaging of the abdomen and pelvis was performed
using the standard protocol following bolus administration of
intravenous contrast.

[Series 3: a/p w/ 5mm · axial · 0.83mm/px · z∈[+644,+1059]mm · 13 of 93 slices shown, 15 images]
[im 5/93  soft-tissue]
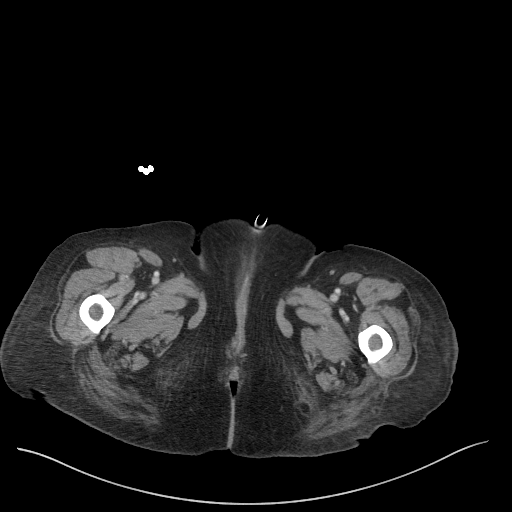
[im 5/93  bone]
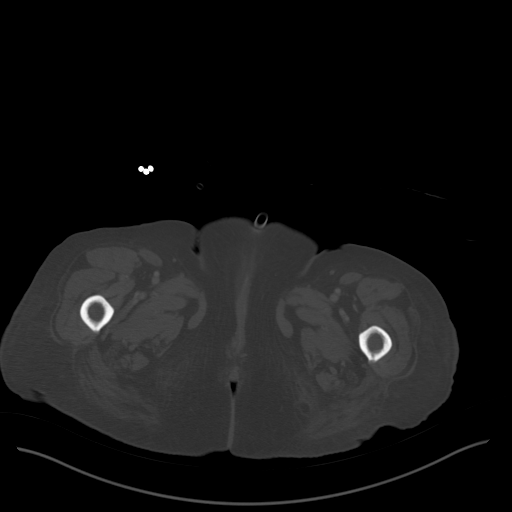
[im 14/93  soft-tissue]
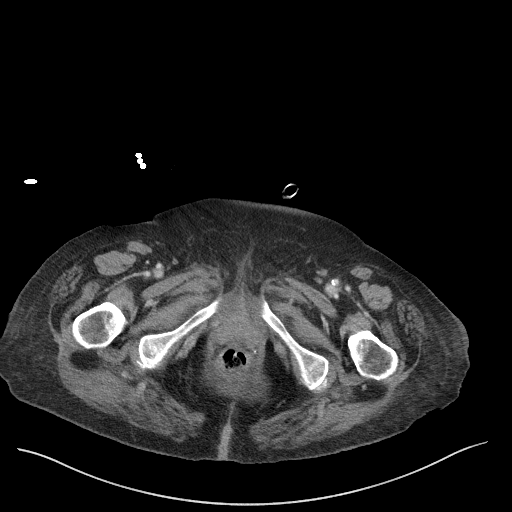
[im 19/93  soft-tissue]
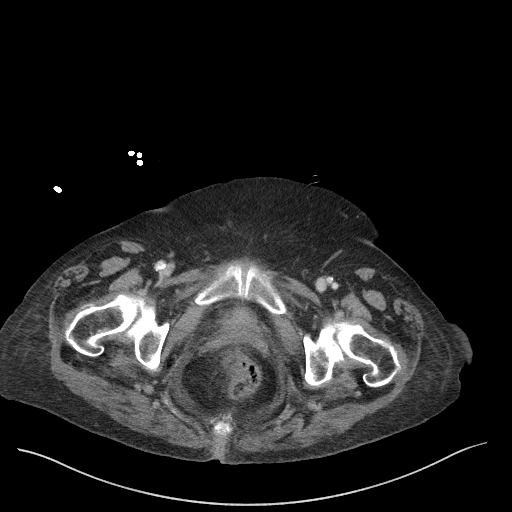
[im 28/93  soft-tissue]
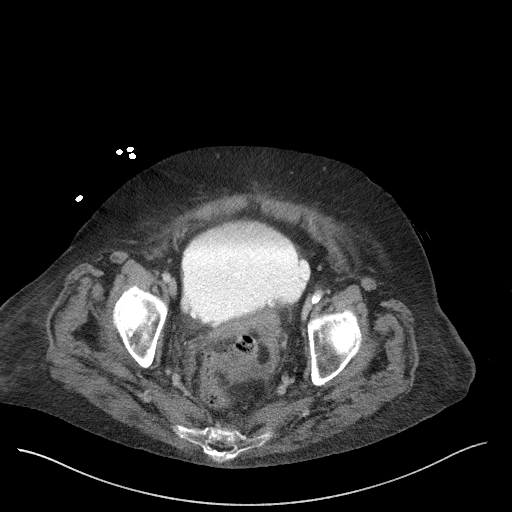
[im 33/93  soft-tissue]
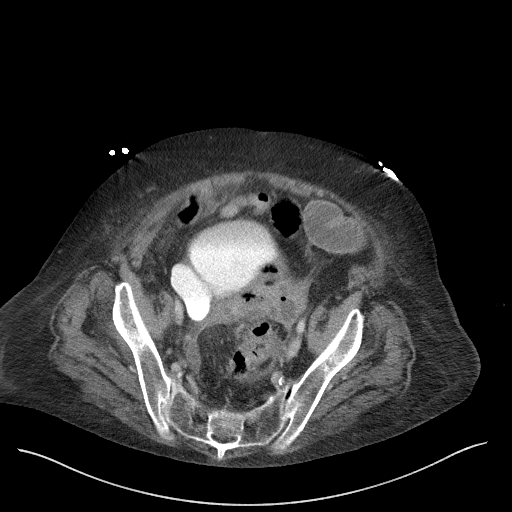
[im 42/93  soft-tissue]
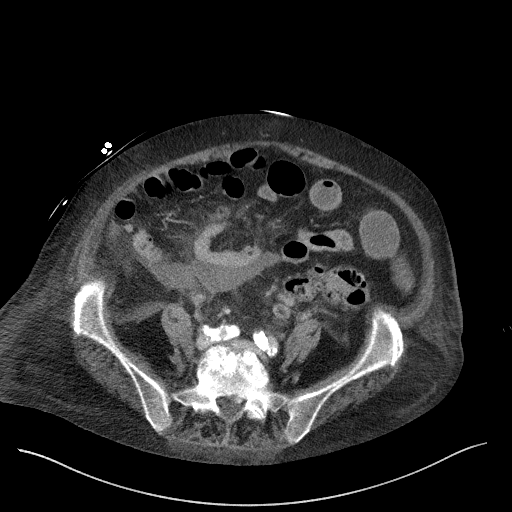
[im 47/93  soft-tissue]
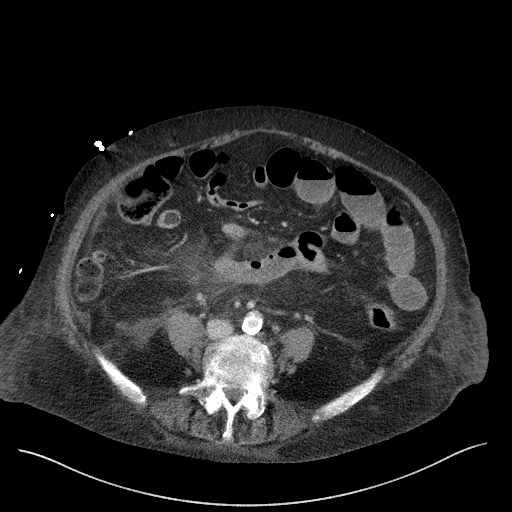
[im 51/93  soft-tissue]
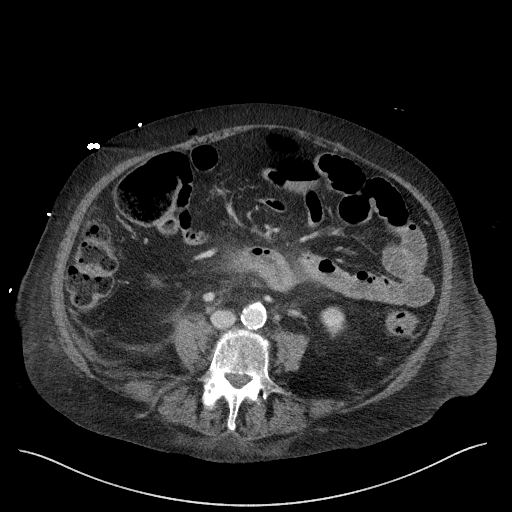
[im 60/93  soft-tissue]
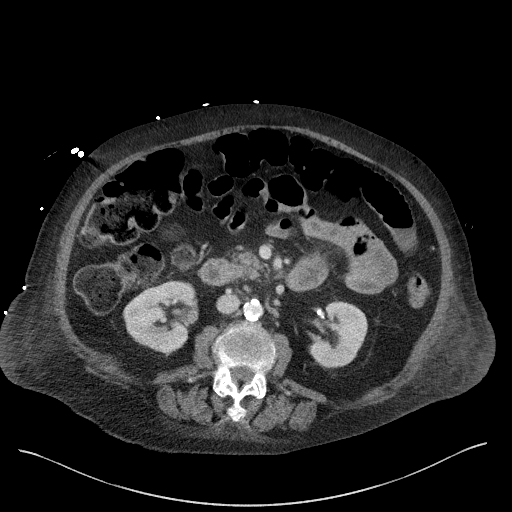
[im 60/93  bone]
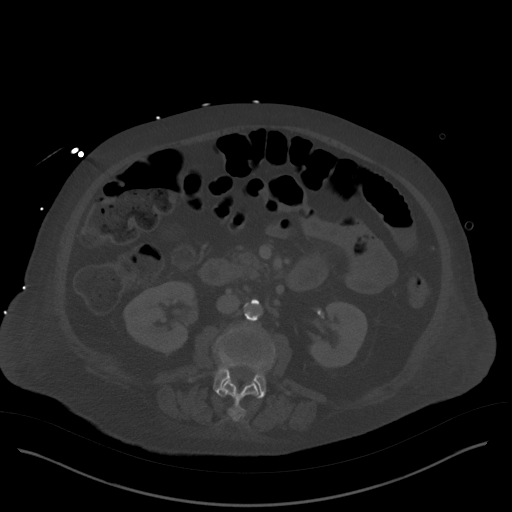
[im 65/93  soft-tissue]
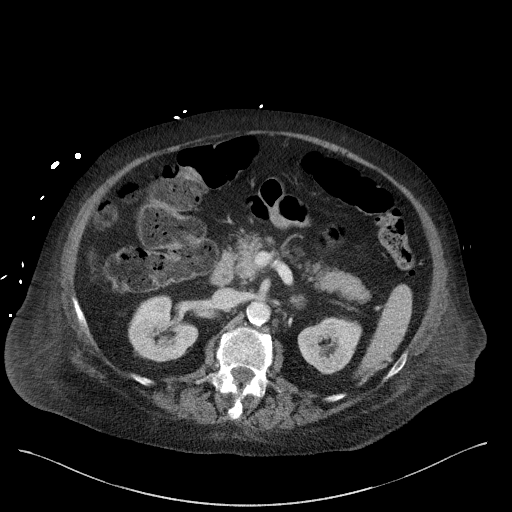
[im 74/93  soft-tissue]
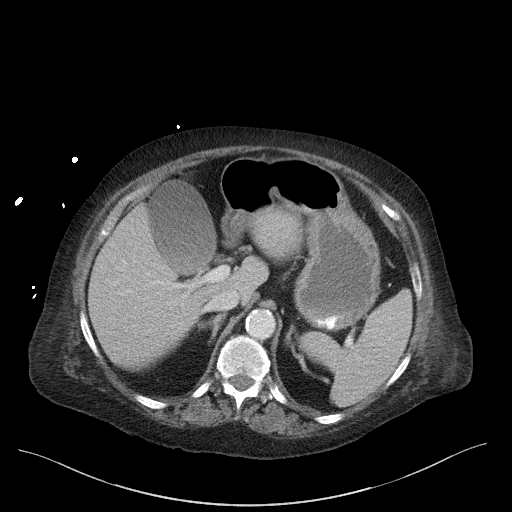
[im 79/93  soft-tissue]
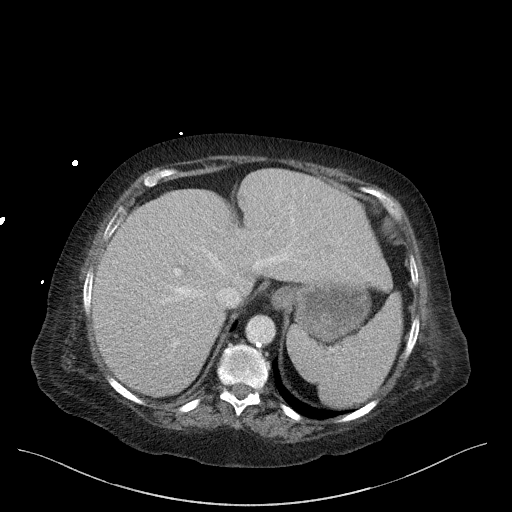
[im 88/93  soft-tissue]
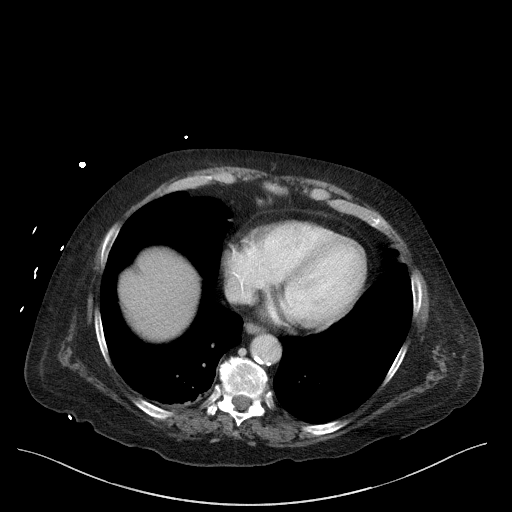

[Series 6: a/p w/ cor · coronal · 0.79mm/px · 3 of 145 slices shown]
[im 49/145  soft-tissue]
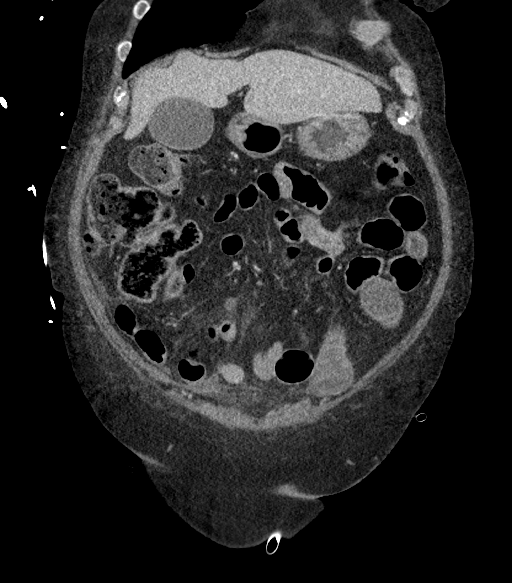
[im 65/145  soft-tissue]
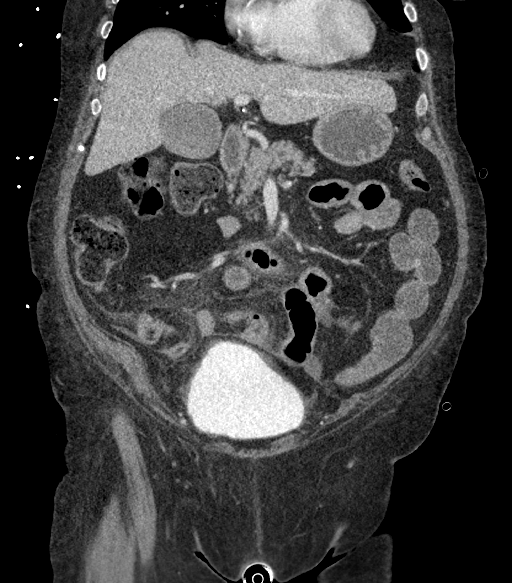
[im 81/145  soft-tissue]
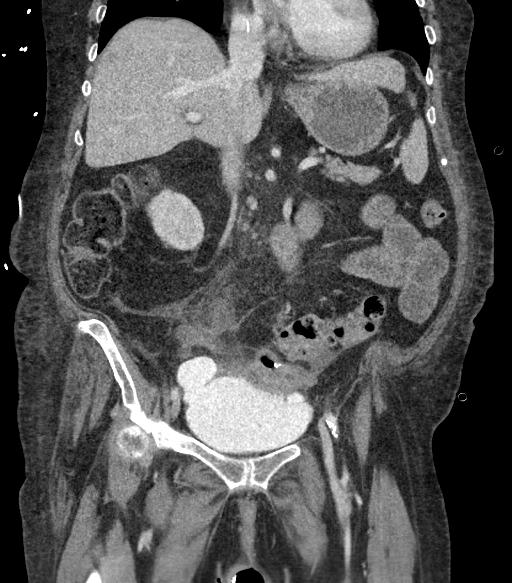

[16 of 46 positions shown; findings below may reference images not displayed]

RADIATION DOSE REDUCTION: This exam was performed according to the
departmental dose-optimization program which includes automated
exposure control, adjustment of the mA and/or kV according to
patient size and/or use of iterative reconstruction technique.

CONTRAST:  100mL OMNIPAQUE IOHEXOL 350 MG/ML SOLN
FINDINGS: Lower chest: No acute abnormality. Minimal dependent atelectasis in
the posterior right lower lobe.

Hepatobiliary: Subcentimeter ill-defined hypodensities in the right
liver are unchanged but remain too small to characterize. No new
focal liver abnormality. Distended gallbladder containing some
excreted contrast. No gallstones, wall thickening, or biliary
dilatation.

Pancreas: Unremarkable. No pancreatic ductal dilatation or
surrounding inflammatory changes.

Spleen: Normal in size without focal abnormality.

Adrenals/Urinary Tract: Unchanged 1.2 cm left adrenal nodule,
characterized as a benign adenoma on prior PET-CT. No further
follow-up required. The right adrenal gland is unremarkable. No
renal mass, calculi, or hydronephrosis. Excreted contrast within the
bladder. Multiple bladder diverticula.

Stomach/Bowel: The stomach is within normal limits. Similar findings
in the pelvis consistent with acute perforated sigmoid
diverticulitis. Previously seen abscess between the sigmoid colon
and bladder currently measures 5.6 x 5.5 cm (series 3, image 60),
previously 5.6 x 5.4 cm. This communicates with a more superior
loculated fluid collection in the central abdomen currently
measuring 2.7 x 10.3 cm (series 3, image 52), previously 2.6 x
cm. Additional fluid collection more inferiorly adjacent to the
rectosigmoid colon currently measures 3.9 x 6.2 cm (series 3, image
70), previously 3.5 x 6.1 cm. No new fluid collection. No
obstruction. Normal appendix.

Vascular/Lymphatic: Aortic atherosclerosis. No enlarged abdominal or
pelvic lymph nodes.

Reproductive: Status post hysterectomy. No adnexal masses.

Other: No pneumoperitoneum.

Musculoskeletal: No acute or significant osseous findings.
IMPRESSION: 1. Similar findings in the pelvis consistent with acute perforated
sigmoid diverticulitis. Three fluid collections/abscesses in the
lower abdomen and pelvis are not significantly changed compared to
prior study.
2. Aortic Atherosclerosis ([OB]-[OB]).

## 2022-01-02 MED ORDER — PREDNISONE 20 MG PO TABS
20.0000 mg | ORAL_TABLET | Freq: Every day | ORAL | Status: DC
Start: 2022-01-02 — End: 2022-01-12
  Administered 2022-01-02 – 2022-01-12 (×11): 20 mg via ORAL
  Filled 2022-01-02 (×11): qty 1

## 2022-01-02 MED ORDER — LIDOCAINE HCL 1 % IJ SOLN
INTRAMUSCULAR | Status: AC
  Filled 2022-01-02: qty 10

## 2022-01-02 MED ORDER — CHLORHEXIDINE GLUCONATE CLOTH 2 % EX PADS
6.0000 | MEDICATED_PAD | Freq: Every day | CUTANEOUS | Status: DC
  Administered 2022-01-02 – 2022-01-11 (×10): 6 via TOPICAL

## 2022-01-02 MED ORDER — CHLORHEXIDINE GLUCONATE 0.12 % MT SOLN
15.0000 mL | Freq: Two times a day (BID) | OROMUCOSAL | Status: DC
  Administered 2022-01-02 – 2022-01-12 (×20): 15 mL via OROMUCOSAL
  Filled 2022-01-02 (×20): qty 15

## 2022-01-02 MED ORDER — IOHEXOL 350 MG/ML SOLN
100.0000 mL | Freq: Once | INTRAVENOUS | Status: AC | PRN
  Administered 2022-01-02: 100 mL via INTRAVENOUS

## 2022-01-02 MED ORDER — ORAL CARE MOUTH RINSE
15.0000 mL | Freq: Two times a day (BID) | OROMUCOSAL | Status: DC
  Administered 2022-01-07 – 2022-01-11 (×8): 15 mL via OROMUCOSAL

## 2022-01-02 NOTE — Progress Notes (Signed)
?Subjective ?No acute events. Feeling about the same. Denies n/v. On CPAP with facemask currently still from the night. No flatus/BM as of yet. ? ?Objective: ?Vital signs in last 24 hours: ?Temp:  [97.8 ?F (36.6 ?C)-98.8 ?F (37.1 ?C)] 98.8 ?F (37.1 ?C) (03/19 4854) ?Pulse Rate:  [81-103] 99 (03/19 0723) ?Resp:  [14-20] 18 (03/19 0723) ?BP: (93-124)/(48-76) 93/76 (03/19 6270) ?SpO2:  [90 %-100 %] 90 % (03/19 0723) ?  ? ?Intake/Output from previous day: ?03/18 0701 - 03/19 0700 ?In: 130 [P.O.:120; I.V.:10] ?Out: -  ?Intake/Output this shift: ?No intake/output data recorded. ? ?Gen: NAD, comfortable ?CV: RRR ?Pulm: Normal work of breathing ?Abd: Soft, mildly distended, mildly ttp ?Ext: SCDs in place ? ?Lab Results: ?CBC  ?Recent Labs  ?  01/01/22 ?1222 01/02/22 ?0400  ?WBC 27.7* 22.5*  ?HGB 8.4* 7.3*  ?HCT 28.2* 25.8*  ?PLT 284 225  ? ?BMET ?Recent Labs  ?  01/01/22 ?1222 01/02/22 ?0400  ?NA 138 135  ?K 4.3 4.2  ?CL 98 98  ?CO2 32 30  ?GLUCOSE 159* 116*  ?BUN 20 21  ?CREATININE 1.29* 1.14*  ?CALCIUM 9.3 9.3  ? ?PT/INR ?No results for input(s): LABPROT, INR in the last 72 hours. ?ABG ?No results for input(s): PHART, HCO3 in the last 72 hours. ? ?Invalid input(s): PCO2, PO2 ? ?Studies/Results: ? ?Anti-infectives: ?Anti-infectives (From admission, onward)  ? ? Start     Dose/Rate Route Frequency Ordered Stop  ? 01/01/22 2000  DAPTOmycin (CUBICIN) injection 500 mg  Status:  Discontinued       ? 10 mL Intravenous Daily 01/01/22 1400 01/01/22 1425  ? 01/01/22 1515  DAPTOmycin (CUBICIN) 500 mg in sodium chloride 0.9 % IVPB       ? 8 mg/kg ? 62.1 kg ?120 mL/hr over 30 Minutes Intravenous Daily 01/01/22 1425    ? 01/01/22 1430  cefTRIAXone (ROCEPHIN) 2 g in sodium chloride 0.9 % 100 mL IVPB       ? 2 g ?200 mL/hr over 30 Minutes Intravenous Every 24 hours 01/01/22 1344    ? 01/01/22 1430  metroNIDAZOLE (FLAGYL) IVPB 500 mg       ? 500 mg ?100 mL/hr over 60 Minutes Intravenous Every 12 hours 01/01/22 1344    ? 01/01/22 1415   piperacillin-tazobactam (ZOSYN) IVPB 3.375 g  Status:  Discontinued       ? 3.375 g ?12.5 mL/hr over 240 Minutes Intravenous Every 8 hours 01/01/22 1329 01/01/22 1344  ? 01/01/22 1400  piperacillin-tazobactam (ZOSYN) IVPB 3.375 g  Status:  Discontinued       ? 3.375 g ?100 mL/hr over 30 Minutes Intravenous Every 8 hours 01/01/22 1309 01/01/22 1328  ? ?  ? ? ? ?Assessment/Plan: ?Patient Active Problem List  ? Diagnosis Date Noted  ? Diverticulitis of intestine with perforation 01/01/2022  ? 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 OSA on CPAP 01/01/2022  ? Hypothyroidism 01/01/2022  ? MRSA infection 12/24/2021  ? Osteomyelitis ([place]) 12/24/2021  ? Hardware complicating wound infection ([place]) 12/24/2021  ? Closed fracture of left tibial plateau 12/13/2021  ? Malignant neoplasm of middle lobe of right lung ([place]) 09/06/2021  ? E coli bacteremia 08/29/2021  ? Sepsis secondary to UTI ([place]) 08/29/2021  ? Acute on chronic respiratory failure with hypoxia and hypercapnia ([place]) 08/28/2021  ? Anemia due to chronic kidney disease 08/28/2021  ? Acute metabolic encephalopathy 35/00/9381  ? GERD (gastroesophageal reflux disease) 08/28/2021  ? DNR (do not resuscitate) 08/28/2021  ?  SIRS (systemic inflammatory response syndrome) ([place]) 08/28/2021  ? Acute respiratory failure with hypoxia and hypercapnia (HCC)   ? Septic shock ([place])   ? Goals of care, counseling/discussion   ? Acute on chronic respiratory failure ([place]) 07/23/2021  ? Hypotension 02/23/2021  ? Hypokalemia 02/23/2021  ? Acute renal failure superimposed on stage 3a chronic kidney disease ([place]) 02/22/2021  ? GI bleed 02/16/2021  ? ABLA (acute blood loss anemia) 02/16/2021  ? Diverticulitis 02/16/2021  ? Current chronic use of systemic steroids 02/16/2021  ? Pressure injury of skin 02/16/2021  ? Olecranon bursitis of right elbow 10/02/2020  ? Ulna, olecranon process fracture, right, closed, with routine healing, subsequent encounter 10/01/2020  ? Primary malignant  neoplasm of left upper lobe of lung ([place]) 07/09/2019  ? Closed fracture of right olecranon process 12/04/2017  ? Cough 11/11/2015  ? Chronic respiratory failure with hypoxia ([place]) 11/11/2014  ? Cigarette smoker 10/16/2014  ? Multinodular thyroid 07/30/2014  ? 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 COPD exacerbation ([place]) 02/11/2013  ? Pedal edema 02/11/2013  ? COPD GOLD III  06/11/2010  ? 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 HYPERLIPIDEMIA, MIXED 05/13/2010  ? HYPERCALCEMIA 05/13/2010  ? Anxiety state 05/13/2010  ? Essential hypertension 05/13/2010  ? CERVICALGIA 05/13/2010  ? OSTEOPENIA 05/13/2010  ? URINARY INCONTINENCE 05/13/2010  ? 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 ?CT A/P 12/31/21 - I have personally reviewed -diffuse sigmoid diverticulosis with suggestion of acute complicated diverticulitis.  5.4 x 5.6 cm gas/fluid collection in her pelvis.  Inseparable from sigmoid and posterior wall of urinary bladder.  5.6 x 3.5 cm perisigmoid collection ? ?76yoF hx lung ca bilateral but treated with SBRT 2021/2022, pretracheal node being followed; recent osteomyelitis without evidence of bony metastases, on outpt daptomycin ? ?-Noted on Dapto (osteo) + now IV Zosyn per ID; afebrile, WBC 22 from 27 ?-IR consult underway to consider percutaneous drain placement ?-We will follow with you; continue NPO today, MIVF ? ? LOS: 1 day  ? ?I spent a total of 50 minutes in both face-to-face and non-face-to-face activities, excluding procedures performed, for this visit on the date of this encounter. ? ?Nadeen Landau, MD FACS ?[facility] ?

## 2022-01-02 NOTE — Progress Notes (Signed)
RT placed patient on CPAP Auto 7 cmH2O- 15 cmH2O via FFM with 3 L O2 bleed in at patient request.  Patient is tolerating at this time.  RT will continue to monitor. ?

## 2022-01-02 NOTE — Care Management (Signed)
?  Transition of Care (TOC) Screening Note ? ? ?Patient Details  ?Name: Holly [name] ?Date of Birth: Apr 03, 1946 ? ? ?Transition of Care (TOC) CM/SW Contact:    ?Carles Collet, RN ?Phone Number: ?01/02/2022, 5:00 PM ? ? ? ?Transition of Care Department [facility]) has reviewed patient and we will continue to monitor patient advancement through interdisciplinary progression rounds.  ?

## 2022-01-02 NOTE — Progress Notes (Signed)
?      ?                 PROGRESS NOTE ? ?      ?PATIENT DETAILS ?Name: Holly [name] ?Age: 76 y.o. ?Sex: female ?Date of Birth: April 13, 1946 ?Admit Date: 01/01/2022 ?Admitting Physician Norval Morton, MD ?POE:UMPNT, [name], MD ? ?Brief Summary: ?Patient is a 76 y.o.  female with history of HTN, HLD, COPD on 3 L of oxygen at home, lung CA-who recently developed a pathological fracture fracture of the left tibial plateau--s/p ORIF (biopsy negative for malignancy-but cultures positive for MRSA-subsequently started on IV daptomycin)-presented as a transfer from [place] for abdominal pain-upon further evaluation she was found to have sigmoid diverticulitis with contained perforation and abscess formation.  She was subsequently transferred to [facility] for further evaluation and treatment. ? ?Significant events: ?3/18>> transfer from [facility] health-sigmoid diverticulitis with perforation/abscess formation ? ?Significant studies: ?3/17>> CT abdomen (done at [facility] health):-diffuse sigmoid diverticulosis with suggestion of acute complicated diverticulitis.  5.4 x 5.6 cm gas/fluid collection in her pelvis.  Inseparable from sigmoid and posterior wall of urinary bladder.  5.6 x 3.5 cm perisigmoid collection ? ?Significant microbiology data: ? ? ?Procedures: ? ? ?Consults: ?General surgery, ID, IR ? ?Subjective: ?Continues to complain of lower abdominal pain.  BM yesterday.  No flatus today. ? ?Objective: ?Vitals: ?Blood pressure 93/76, pulse 90, temperature 98.8 ?F (37.1 ?C), temperature source Oral, resp. rate 19, SpO2 98 %.  ? ?Exam: ?Gen Exam:Alert awake-not in any distress ?HEENT:atraumatic, normocephalic ?Chest: B/L clear to auscultation anteriorly ?CVS:S1S2 regular ?Abdomen: Soft-mildly tender in the lower abdominal area without any peritoneal signs. ?Extremities:no edema ?Neurology: Non focal ?Skin: no rash ? ?Pertinent Labs/Radiology: ?CBC Latest Ref Rng & Units 01/02/2022 01/01/2022 12/14/2021  ?WBC 4.0 - 10.5  K/uL 22.5(H) 27.7(H) 9.5  ?Hemoglobin 12.0 - 15.0 g/dL 7.3(L) 8.4(L) 7.9(L)  ?Hematocrit 36.0 - 46.0 % 25.8(L) 28.2(L) 26.9(L)  ?Platelets 150 - 400 K/uL 225 284 188  ?  ?Lab Results  ?Component Value Date  ? NA 135 01/02/2022  ? K 4.2 01/02/2022  ? CL 98 01/02/2022  ? CO2 30 01/02/2022  ?  ? ? ?Assessment/Plan: ?Diverticulitis of sigmoid colon with contained perforation-pelvic abscess: Continue n.p.o. status-on empiric Rocephin/Flagyl-General surgery following-IR planning CT-guided drain placement.  Will await further recommendations. ? 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 ? ?CKD stage IIIb: Close to baseline-continue to monitor closely. ? ?Hypothyroidism: Continue Synthroid ? ?GERD: Continue PPI ? ?COPD with chronic hypoxic respiratory failure on 3 L of oxygen at home) continue bronchodilators-continue oxygen as usual.  On chronic prednisone 20 mg daily-which has been resumed. ? ?History of lung cancer: Follow with medical oncology/radiation oncology-currently stable. ? ?OSA: Continue CPAP nightly ? ?Anxiety: Relatively stable-continue BuSpar, Wellbutrin and as needed Xanax. ? ?BMI: ?Estimated body mass index is 26.76 kg/m? as calculated from the following: ?  Height as of 12/24/21: 5' (1.524 m). ?  Weight as of 12/24/21: 62.1 kg.  ? ?Code status: ?  Code Status: DNR  ? ?DVT Prophylaxis: ?enoxaparin (LOVENOX) injection 40 mg Start: 01/01/22 1500 ?Place and maintain sequential compression device Start: 01/01/22 1353 ?  ?Family Communication: Daughter at bedside ? ? ?Disposition Plan: ?Status is: Inpatient ?Remains inpatient appropriate because: Pelvic abscess-perforated sigmoid diverticulitis-awaiting IR drain placement. ?  ?Planned Discharge Destination:Home health ? ? ?Diet: ?Diet Order   ? ?       ?  Diet NPO time specified Except for: Sips with Meds,  Ice Chips  Diet effective now       ?  ? ?  ?  ? ?  ?  ? ? ?Antimicrobial agents: ?Anti-infectives (From admission, onward)  ? ? Start      Dose/Rate Route Frequency Ordered Stop  ? 01/01/22 2000  DAPTOmycin (CUBICIN) injection 500 mg  Status:  Discontinued       ? 10 mL Intravenous Daily 01/01/22 1400 01/01/22 1425  ? 01/01/22 1515  DAPTOmycin (CUBICIN) 500 mg in sodium chloride 0.9 % IVPB       ? 8 mg/kg ? 62.1 kg ?120 mL/hr over 30 Minutes Intravenous Daily 01/01/22 1425    ? 01/01/22 1430  cefTRIAXone (ROCEPHIN) 2 g in sodium chloride 0.9 % 100 mL IVPB       ? 2 g ?200 mL/hr over 30 Minutes Intravenous Every 24 hours 01/01/22 1344    ? 01/01/22 1430  metroNIDAZOLE (FLAGYL) IVPB 500 mg       ? 500 mg ?100 mL/hr over 60 Minutes Intravenous Every 12 hours 01/01/22 1344    ? 01/01/22 1415  piperacillin-tazobactam (ZOSYN) IVPB 3.375 g  Status:  Discontinued       ? 3.375 g ?12.5 mL/hr over 240 Minutes Intravenous Every 8 hours 01/01/22 1329 01/01/22 1344  ? 01/01/22 1400  piperacillin-tazobactam (ZOSYN) IVPB 3.375 g  Status:  Discontinued       ? 3.375 g ?100 mL/hr over 30 Minutes Intravenous Every 8 hours 01/01/22 1309 01/01/22 1328  ? ?  ? ? ? ?MEDICATIONS: ?Scheduled Meds: ? atorvastatin  20 mg Oral QHS  ? buPROPion  300 mg Oral q morning  ? busPIRone  5 mg Oral BID  ? Chlorhexidine Gluconate Cloth  6 each Topical Daily  ? donepezil  10 mg Oral QHS  ? enoxaparin (LOVENOX) injection  40 mg Subcutaneous Q24H  ? fluticasone furoate-vilanterol  1 puff Inhalation Daily  ? And  ? umeclidinium bromide  1 puff Inhalation Daily  ? levothyroxine  137 mcg Oral Q0600  ? lidocaine      ? mirtazapine  30 mg Oral QHS  ? pantoprazole  40 mg Oral BID  ? sodium chloride flush  3 mL Intravenous Q12H  ? ?Continuous Infusions: ? sodium chloride 100 mL/hr at 01/02/22 0140  ? cefTRIAXone (ROCEPHIN)  IV 2 g (01/01/22 1507)  ? DAPTOmycin (CUBICIN)  IV 500 mg (01/01/22 1657)  ? metronidazole 500 mg (01/02/22 0139)  ? ?PRN Meds:.acetaminophen **OR** acetaminophen, albuterol, ALPRAZolam, HYDROmorphone (DILAUDID) injection ? ? ?I have personally reviewed following labs and  imaging studies ? ?LABORATORY DATA: ?CBC: ?Recent Labs  ?Lab 01/01/22 ?1222 01/02/22 ?0400  ?WBC 27.7* 22.5*  ?NEUTROABS 24.8*  --   ?HGB 8.4* 7.3*  ?HCT 28.2* 25.8*  ?MCV 95.3 95.6  ?PLT 284 225  ? ? ?Basic Metabolic Panel: ?Recent Labs  ?Lab 01/01/22 ?1222 01/02/22 ?0400  ?NA 138 135  ?K 4.3 4.2  ?CL 98 98  ?CO2 32 30  ?GLUCOSE 159* 116*  ?BUN 20 21  ?CREATININE 1.29* 1.14*  ?CALCIUM 9.3 9.3  ?MG 2.3  --   ?PHOS 4.5  --   ? ? ?GFR: ?Estimated Creatinine Clearance: 34 mL/min (A) (by C-G formula based on SCr of 1.14 mg/dL (H)). ? ?Liver Function Tests: ?Recent Labs  ?Lab 01/01/22 ?1222  ?AST 11*  ?ALT 9  ?ALKPHOS 82  ?BILITOT 0.4  ?PROT 5.6*  ?ALBUMIN 1.8*  ? ?No results for input(s): LIPASE, AMYLASE in the last 168 hours. ?No  results for input(s): AMMONIA in the last 168 hours. ? ?Coagulation Profile: ?No results for input(s): INR, PROTIME in the last 168 hours. ? ?Cardiac Enzymes: ?Recent Labs  ?Lab 01/02/22 ?0400  ?CKTOTAL <5*  ? ? ?BNP (last 3 results) ?No results for input(s): PROBNP in the last 8760 hours. ? ?Lipid Profile: ?No results for input(s): CHOL, HDL, LDLCALC, TRIG, CHOLHDL, LDLDIRECT in the last 72 hours. ? ?Thyroid Function Tests: ?No results for input(s): TSH, T4TOTAL, FREET4, T3FREE, THYROIDAB in the last 72 hours. ? ?Anemia Panel: ?No results for input(s): VITAMINB12, FOLATE, FERRITIN, TIBC, IRON, RETICCTPCT in the last 72 hours. ? ?Urine analysis: ?   ?Component Value Date/Time  ? COLORURINE YELLOW 08/29/2021 0400  ? APPEARANCEUR CLEAR 08/29/2021 0400  ? LABSPEC 1.012 08/29/2021 0400  ? PHURINE 5.0 08/29/2021 0400  ? GLUCOSEU NEGATIVE 08/29/2021 0400  ? HGBUR MODERATE (A) 08/29/2021 0400  ? BILIRUBINUR NEGATIVE 08/29/2021 0400  ? KETONESUR NEGATIVE 08/29/2021 0400  ? PROTEINUR NEGATIVE 08/29/2021 0400  ? NITRITE POSITIVE (A) 08/29/2021 0400  ? LEUKOCYTESUR MODERATE (A) 08/29/2021 0400  ? ? ?Sepsis Labs: ?Lactic Acid, Venous ?   ?Component Value Date/Time  ? LATICACIDVEN 1.1 01/01/2022 2205   ? ? ?MICROBIOLOGY: ?No results found for this or any previous visit (from the past 240 hour(s)). ? ?RADIOLOGY STUDIES/RESULTS: ?DG Abd Portable 1V ? ?Result Date: 01/01/2022 ?CLINICAL DATA:  Diverticulitis EXAM:

## 2022-01-03 DIAGNOSIS — K5720 Diverticulitis of large intestine with perforation and abscess without bleeding: Secondary | ICD-10-CM | POA: Diagnosis not present

## 2022-01-03 DIAGNOSIS — M869 Osteomyelitis, unspecified: Secondary | ICD-10-CM | POA: Diagnosis not present

## 2022-01-03 DIAGNOSIS — J9611 Chronic respiratory failure with hypoxia: Secondary | ICD-10-CM | POA: Diagnosis not present

## 2022-01-03 DIAGNOSIS — J42 Unspecified chronic bronchitis: Secondary | ICD-10-CM | POA: Diagnosis not present

## 2022-01-03 LAB — BASIC METABOLIC PANEL
Anion gap: 9 (ref 5–15)
BUN: 19 mg/dL (ref 8–23)
CO2: 27 mmol/L (ref 22–32)
Calcium: 9.5 mg/dL (ref 8.9–10.3)
Chloride: 104 mmol/L (ref 98–111)
Creatinine, Ser: 0.91 mg/dL (ref 0.44–1.00)
GFR, Estimated: >60 mL/min (ref >60)
Glucose, Bld: 97 mg/dL (ref 70–99)
Potassium: 4.4 mmol/L (ref 3.5–5.1)
Sodium: 140 mmol/L (ref 135–145)

## 2022-01-03 LAB — CBC
HCT: 21.9 % — ABNORMAL LOW (ref 36.0–46.0)
Hemoglobin: 6.1 g/dL — CL (ref 12.0–15.0)
MCH: 27.1 pg (ref 26.0–34.0)
MCHC: 27.9 g/dL — ABNORMAL LOW (ref 30.0–36.0)
MCV: 97.3 fL (ref 80.0–100.0)
Platelets: 197 10*3/uL (ref 150–400)
RBC: 2.25 MIL/uL — ABNORMAL LOW (ref 3.87–5.11)
RDW: 17.3 % — ABNORMAL HIGH (ref 11.5–15.5)
WBC: 16.2 10*3/uL — ABNORMAL HIGH (ref 4.0–10.5)
nRBC: 0 % (ref 0.0–0.2)

## 2022-01-03 LAB — PREPARE RBC (CROSSMATCH)

## 2022-01-03 LAB — PROTIME-INR
INR: 1.2 (ref 0.8–1.2)
Prothrombin Time: 15.2 seconds (ref 11.4–15.2)

## 2022-01-03 LAB — GLUCOSE, CAPILLARY
Glucose-Capillary: 83 mg/dL (ref 70–99)
Glucose-Capillary: 98 mg/dL (ref 70–99)
Glucose-Capillary: 99 mg/dL (ref 70–99)
Glucose-Capillary: 93 mg/dL (ref 70–99)

## 2022-01-03 MED ORDER — DIPHENHYDRAMINE HCL 50 MG/ML IJ SOLN
25.0000 mg | Freq: Once | INTRAMUSCULAR | Status: AC
  Administered 2022-01-03: 25 mg via INTRAVENOUS
  Filled 2022-01-03: qty 1

## 2022-01-03 MED ORDER — SODIUM CHLORIDE 0.9% FLUSH
10.0000 mL | INTRAVENOUS | Status: DC | PRN
  Administered 2022-01-04: 10 mL

## 2022-01-03 MED ORDER — SODIUM CHLORIDE 0.9% IV SOLUTION: Freq: Once | INTRAVENOUS | Status: AC

## 2022-01-03 MED ORDER — ENOXAPARIN SODIUM 40 MG/0.4ML IJ SOSY: 40.0000 mg | PREFILLED_SYRINGE | INTRAMUSCULAR | Status: DC

## 2022-01-03 MED ORDER — FUROSEMIDE 10 MG/ML IJ SOLN
20.0000 mg | Freq: Once | INTRAMUSCULAR | Status: AC
  Administered 2022-01-03: 20 mg via INTRAVENOUS
  Filled 2022-01-03: qty 2

## 2022-01-03 MED ORDER — ADULT MULTIVITAMIN W/MINERALS CH
1.0000 | ORAL_TABLET | Freq: Every day | ORAL | Status: DC
  Administered 2022-01-03 – 2022-01-12 (×10): 1 via ORAL
  Filled 2022-01-03 (×10): qty 1

## 2022-01-03 MED ORDER — ENOXAPARIN SODIUM 40 MG/0.4ML IJ SOSY
40.0000 mg | PREFILLED_SYRINGE | Freq: Every day | INTRAMUSCULAR | Status: DC
  Administered 2022-01-03 – 2022-01-04 (×2): 40 mg via SUBCUTANEOUS
  Filled 2022-01-03 (×2): qty 0.4

## 2022-01-03 MED ORDER — ACETAMINOPHEN 325 MG PO TABS
650.0000 mg | ORAL_TABLET | Freq: Once | ORAL | Status: AC
  Administered 2022-01-03: 650 mg via ORAL

## 2022-01-03 NOTE — Progress Notes (Signed)
Date and time results received: 01/03/22 0910 ?(use smartphrase ".now" to insert current time) ? ?Test: Hgb ?Critical Value: Hgb 6.1 ? ?Name of Provider Notified: Dr Sloan Leiter ? ?Orders Received? Or Actions Taken?: No new orders, MD aware ?

## 2022-01-03 NOTE — Progress Notes (Signed)
? ? ?   ?Subjective: ?Patient not feeling too much better yet.  Still having some abdominal pain.  Feels bloated.  No nausea or vomiting.  No BM yet.   ? ?ROS: See above, otherwise other systems negative ? ?Objective: ?Vital signs in last 24 hours: ?Temp:  [97.9 ?F (36.6 ?C)-98.9 ?F (37.2 ?C)] 98.5 ?F (36.9 ?C) (03/20 4709) ?Pulse Rate:  [81-98] 85 (03/20 0834) ?Resp:  [11-18] 16 (03/20 0834) ?BP: (96-128)/(50-57) 128/55 (03/20 6283) ?SpO2:  [92 %-100 %] 100 % (03/20 0834) ?FiO2 (%):  [32 %] 32 % (03/20 0834) ?  ? ?Intake/Output from previous day: ?03/19 0701 - 03/20 0700 ?In: 1380 [P.O.:60; I.V.:1200; IV Piggyback:120] ?Out: 1800 [Urine:1800] ?Intake/Output this shift: ?No intake/output data recorded. ? ?PE: ?Gen: NAD, but does appear somewhat chronically ill ?Heart: regular ?Lungs: O2 in place, effort nonlabored ?Abd: soft, tender greatest in RLQ and suprapubic region.  Less tender in LLQ, distended, +BS, no peritonitis or rebounding ? ?Lab Results:  ?Recent Labs  ?  01/02/22 ?0400 01/03/22 ?0822  ?WBC 22.5* 16.2*  ?HGB 7.3* 6.1*  ?HCT 25.8* 21.9*  ?PLT 225 197  ? ?BMET ?Recent Labs  ?  01/02/22 ?0400 01/03/22 ?0822  ?NA 135 140  ?K 4.2 4.4  ?CL 98 104  ?CO2 30 27  ?GLUCOSE 116* 97  ?BUN 21 19  ?CREATININE 1.14* 0.91  ?CALCIUM 9.3 9.5  ? ?PT/INR ?Recent Labs  ?  01/03/22 ?0643  ?LABPROT 15.2  ?INR 1.2  ? ?CMP  ?   ?Component Value Date/Time  ? NA 140 01/03/2022 0822  ? K 4.4 01/03/2022 0822  ? CL 104 01/03/2022 0822  ? CO2 27 01/03/2022 0822  ? GLUCOSE 97 01/03/2022 0822  ? BUN 19 01/03/2022 0822  ? CREATININE 0.91 01/03/2022 0822  ? CREATININE 0.87 12/09/2021 1050  ? CALCIUM 9.5 01/03/2022 0822  ? PROT 5.6 (L) 01/01/2022 1222  ? ALBUMIN 1.8 (L) 01/01/2022 1222  ? AST 11 (L) 01/01/2022 1222  ? AST 12 (L) 12/09/2021 1050  ? ALT 9 01/01/2022 1222  ? ALT 12 12/09/2021 1050  ? ALKPHOS 82 01/01/2022 1222  ? BILITOT 0.4 01/01/2022 1222  ? BILITOT 0.4 12/09/2021 1050  ? GFRNONAA >60 01/03/2022 6629  ? GFRNONAA >60  12/09/2021 1050  ? GFRAA 58 (L) 05/14/2020 1309  ? ?Lipase  ?No results found for: LIPASE ? ? ? ? ?Studies/Results: ?CT ABDOMEN PELVIS W CONTRAST ? ?Result Date: 01/02/2022 ?CLINICAL DATA:  Perforated diverticulitis follow-up. EXAM: CT ABDOMEN AND PELVIS WITH CONTRAST TECHNIQUE: Multidetector CT imaging of the abdomen and pelvis was performed using the standard protocol following bolus administration of intravenous contrast. RADIATION DOSE REDUCTION: This exam was performed according to the departmental dose-optimization program which includes automated exposure control, adjustment of the mA and/or kV according to patient size and/or use of iterative reconstruction technique. CONTRAST:  138mL OMNIPAQUE IOHEXOL 350 MG/ML SOLN COMPARISON:  CT abdomen pelvis dated December 31, 2021. FINDINGS: Lower chest: No acute abnormality. Minimal dependent atelectasis in the posterior right lower lobe. Hepatobiliary: Subcentimeter ill-defined hypodensities in the right liver are unchanged but remain too small to characterize. No new focal liver abnormality. Distended gallbladder containing some excreted contrast. No gallstones, wall thickening, or biliary dilatation. Pancreas: Unremarkable. No pancreatic ductal dilatation or surrounding inflammatory changes. Spleen: Normal in size without focal abnormality. Adrenals/Urinary Tract: Unchanged 1.2 cm left adrenal nodule, characterized as a benign adenoma on prior PET-CT. No further follow-up required. The right adrenal gland is unremarkable. No renal mass,  calculi, or hydronephrosis. Excreted contrast within the bladder. Multiple bladder diverticula. Stomach/Bowel: The stomach is within normal limits. Similar findings in the pelvis consistent with acute perforated sigmoid diverticulitis. Previously seen abscess between the sigmoid colon and bladder currently measures 5.6 x 5.5 cm (series 3, image 60), previously 5.6 x 5.4 cm. This communicates with a more superior loculated fluid  collection in the central abdomen currently measuring 2.7 x 10.3 cm (series 3, image 52), previously 2.6 x 8.4 cm. Additional fluid collection more inferiorly adjacent to the rectosigmoid colon currently measures 3.9 x 6.2 cm (series 3, image 70), previously 3.5 x 6.1 cm. No new fluid collection. No obstruction. Normal appendix. Vascular/Lymphatic: Aortic atherosclerosis. No enlarged abdominal or pelvic lymph nodes. Reproductive: Status post hysterectomy. No adnexal masses. Other: No pneumoperitoneum. Musculoskeletal: No acute or significant osseous findings. IMPRESSION: 1. Similar findings in the pelvis consistent with acute perforated sigmoid diverticulitis. Three fluid collections/abscesses in the lower abdomen and pelvis are not significantly changed compared to prior study. 2. Aortic Atherosclerosis (ICD10-I70.0). Electronically Signed   By: Titus Dubin M.D.   On: 01/02/2022 17:03  ? ?DG Abd Portable 1V ? ?Result Date: 01/01/2022 ?CLINICAL DATA:  Diverticulitis EXAM: PORTABLE ABDOMEN - 1 VIEW COMPARISON:  Abdominal radiographs done on 08/11/2014, CT done on 12/31/2021 FINDINGS: There is moderate gaseous distention of stomach. There is moderate gaseous distention of transverse colon. There is no significant small bowel dilation. There is residual contrast in the urinary bladder. There is 4.6 cm bladder diverticulum in the right side of the urinary bladder with contrast in the lumen. IMPRESSION: There is moderate gaseous distention of stomach and transverse colon, possibly suggesting ileus. There is possible urinary bladder diverticulum. Electronically Signed   By: Elmer Picker M.D.   On: 01/01/2022 15:13   ? ?Anti-infectives: ?Anti-infectives (From admission, onward)  ? ? Start     Dose/Rate Route Frequency Ordered Stop  ? 01/01/22 2000  DAPTOmycin (CUBICIN) injection 500 mg  Status:  Discontinued       ? 10 mL Intravenous Daily 01/01/22 1400 01/01/22 1425  ? 01/01/22 1515  DAPTOmycin (CUBICIN) 500 mg  in sodium chloride 0.9 % IVPB       ? 8 mg/kg ? 62.1 kg ?120 mL/hr over 30 Minutes Intravenous Daily 01/01/22 1425    ? 01/01/22 1430  cefTRIAXone (ROCEPHIN) 2 g in sodium chloride 0.9 % 100 mL IVPB       ? 2 g ?200 mL/hr over 30 Minutes Intravenous Every 24 hours 01/01/22 1344    ? 01/01/22 1430  metroNIDAZOLE (FLAGYL) IVPB 500 mg       ? 500 mg ?100 mL/hr over 60 Minutes Intravenous Every 12 hours 01/01/22 1344    ? 01/01/22 1415  piperacillin-tazobactam (ZOSYN) IVPB 3.375 g  Status:  Discontinued       ? 3.375 g ?12.5 mL/hr over 240 Minutes Intravenous Every 8 hours 01/01/22 1329 01/01/22 1344  ? 01/01/22 1400  piperacillin-tazobactam (ZOSYN) IVPB 3.375 g  Status:  Discontinued       ? 3.375 g ?100 mL/hr over 30 Minutes Intravenous Every 8 hours 01/01/22 1309 01/01/22 1328  ? ?  ? ? ? ?Assessment/Plan ?Diverticulitis with 2 intra-abdominal fluid collections ?-wbc coming down significantly in the last 2 days from 26K to 16K today.  She is AF ?-still with quite a bit of pain on abdominal exam ?-IR unable to drain these collections and feels the one in the pelvis is ascites rather than abscess ?-given exam, will  continue NPO x sips and chips to continue bowel rest ?-cont abx therapy ?-continue conservative management and try to avoid surgery in this patient who is chronically ill, on steroids, with other acute infectious etiologies present. ?-recheck labs in am ?-d/w Dr. Sloan Leiter  ? ?FEN - NPO x sips and chips/ IVFs ?VTE - Lovenox ?ID - cubacin, rocephin, flagyl ? 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 ?I reviewed hospitalist notes, last 24 h vitals and pain scores, last 48 h intake and output, last 24 h labs and trends, and last 24 h imaging results. ? ? LOS: 2 days  ? ? ?Holly [name] , PA-C ?[facility] ?01/03/2022, 10:03 AM ?Please see Amion for pager number  during day hours 7:00am-4:30pm or 7:00am -11:30am on weekends ? ?

## 2022-01-03 NOTE — Progress Notes (Signed)
?   ? ? ? ? ?[facility] ? ?Date of Admission:  01/01/2022    ? ?Lines:  ?3/15-c rue picc single lumen ?  ?Abx: ?3/18-c ceftriaxone/flagyl ?3/15-c daptomycin                                                     ?  ?3/18 ertapenem-->piptazo ?  ?Assessment: ?76 yo female copd, nsclc rml and LUL, left tibial plateau pathological fracture s/p orif 2/27 with biopsy/culture --> infected with mrsa (biopsy no malignant cell), admitted 6/01 for complicated diverticulitis with 5 cm abscess intraabd ?  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 Previously on doxy ?  ?  ?Will continue daptomycin to finish the 6 weeks from start date 3/15 until 4/26. Will need chronic suppression abx likely with doxycycline after that end date ?I agree with Dr Tommy Medal that metastatic cancer in the knee is not ruled out ?  ?  ?#complicated diverticulitis ?She was seen at [facility] for abd pain and found to have 5 cm abscess around ruptured diverticulitis ?Surgery evaluated and recommended IR drain placement ?She was started on ertapenem at [facility] but changed to piptazo here ?  ?Surgery/IR evaluated. Not surgical candidate at this time. IR repeat imaging --> no safe windows to put a drain catheter ?Wbc and slight pain improvement on abx ?  ?  ?  ?Plan: ?Continue ceftriaxone metronidazole and daptomycin ?Continue q72hrs cbc, cmp, crp/ck ?Discussed with primary team ?  ? ?Principal Problem: ?  Diverticulitis of intestine with perforation ?Active Problems: ?  Anxiety state ?  COPD exacerbation ([place]) ?  Chronic respiratory failure with hypoxia ([facility]) ?  GERD (gastroesophageal reflux disease) ?  DNR (do not resuscitate) ?  Malignant neoplasm of middle lobe of right lung ([place]) ?  Osteomyelitis ([place]) ?  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  OSA on CPAP ?   Hypothyroidism ? ? ?Allergies  ?Allergen Reactions  ? Amlodipine Besylate Swelling  ?  Edema  ? Codeine Nausea And Vomiting  ? Oxycodone-Acetaminophen Nausea And Vomiting  ? ? ?Scheduled Meds: ? sodium chloride   Intravenous Once  ? acetaminophen  650 mg Oral Once  ? atorvastatin  20 mg Oral QHS  ? buPROPion  300 mg Oral q morning  ? busPIRone  5 mg Oral BID  ? chlorhexidine  15 mL Mouth Rinse BID  ? Chlorhexidine Gluconate Cloth  6 each Topical Daily  ? diphenhydrAMINE  25 mg Intravenous Once  ? donepezil  10 mg Oral QHS  ? enoxaparin (LOVENOX) injection  40 mg Subcutaneous Daily  ? fluticasone furoate-vilanterol  1 puff Inhalation Daily  ? And  ? umeclidinium bromide  1 puff Inhalation Daily  ? furosemide  20 mg Intravenous Once  ? levothyroxine  137 mcg Oral Q0600  ? mouth rinse  15 mL Mouth Rinse q12n4p  ? mirtazapine  30 mg Oral QHS  ? pantoprazole  40 mg Oral BID  ? predniSONE  20 mg Oral Daily  ? sodium chloride flush  3 mL Intravenous Q12H  ? ?Continuous Infusions: ? sodium chloride 100 mL/hr at 01/02/22 1341  ? cefTRIAXone (ROCEPHIN)  IV 2 g (01/02/22 1347)  ? DAPTOmycin (CUBICIN)  IV 500 mg (01/02/22 2108)  ? metronidazole 500 mg (01/03/22 0139)  ? ?PRN Meds:.acetaminophen **OR** acetaminophen, albuterol, ALPRAZolam, HYDROmorphone (DILAUDID) injection ? ? ?SUBJECTIVE: ?Pain improving ?No n/v/diarrhea ?No fever ? ?Can't do drain placement per ir ? ?Review of Systems: ?ROS ?All other ROS was negative, except mentioned above ? ? ? ? ?OBJECTIVE: ?Vitals:  ? 01/03/22 0400 01/03/22 0800 01/03/22 0807 01/03/22 0834  ?BP: (!) 96/51  (!) 128/55   ?Pulse: 82 81 89 85  ?Resp: 15 15 16 16   ?Temp: 98.5 ?F (36.9 ?C)  98.5 ?F (36.9 ?C)   ?TempSrc: Axillary  Oral   ?SpO2: 96% 100%  100%  ? ?There is no height or weight on file to calculate BMI. ? ?Physical Exam ? ?General/constitutional: no distress, pleasant ?HEENT: Normocephalic, PER, Conj Clear, EOMI, Oropharynx clear ?Neck supple ?CV: rrr no mrg ?Lungs: clear to  auscultation, normal respiratory effort ?Abd: Soft; tender moderately diffusely; no rebound/guarding ?Ext: no edema ?Skin: No Rash ?Neuro: nonfocal ?MSK: no peripheral joint swelling/tenderness/warmth ? ?Central line presence: RUE picc site no purulence/tenderness ? ?Lab Results ?Lab Results  ?Component Value Date  ? WBC 16.2 (H) 01/03/2022  ? HGB 6.1 (LL) 01/03/2022  ? HCT 21.9 (L) 01/03/2022  ? MCV 97.3 01/03/2022  ? PLT 197 01/03/2022  ?  ?Lab Results  ?Component Value Date  ? CREATININE 0.91 01/03/2022  ? BUN 19 01/03/2022  ? NA 140 01/03/2022  ? K 4.4 01/03/2022  ? CL 104 01/03/2022  ? CO2 27 01/03/2022  ?  ?Lab Results  ?Component Value Date  ? ALT 9 01/01/2022  ? AST 11 (L) 01/01/2022  ? ALKPHOS 82 01/01/2022  ? BILITOT 0.4 01/01/2022  ?  ? ? ?Microbiology: ?No results found for this or any previous visit (from the past 240 hour(s)). ? ? ?Serology: ? ? ?Imaging: ?If present, new imagings (plain films, ct scans, and mri) have been personally visualized and interpreted; radiology reports have been reviewed. Decision making incorporated into the Impression / Recommendations. ? ?3/19 ct abd/pelv ?Stomach/Bowel: The stomach is within normal limits. Similar findings ?in the pelvis consistent with acute perforated sigmoid ?diverticulitis. Previously seen abscess between the sigmoid colon ?and bladder currently measures 5.6 x 5.5 cm (series 3, image 60), ?previously 5.6 x 5.4 cm. This communicates with a more superior ?loculated fluid collection in the central abdomen currently ?measuring 2.7 x 10.3 cm (series 3, image 52), previously 2.6 x 8.4 ?cm. Additional fluid collection more inferiorly adjacent to the ?rectosigmoid colon currently measures 3.9 x 6.2 cm (series 3, image ?70), previously 3.5 x 6.1 cm. No new fluid collection. No ?obstruction. Normal appendix. ?  ?Vascular/Lymphatic: Aortic atherosclerosis. No enlarged abdominal or ?pelvic lymph nodes. ?  ?Reproductive: Status post hysterectomy. No adnexal  masses. ?  ?Other: No pneumoperitoneum. ?  ?Musculoskeletal: No acute or significant osseous findings. ?  ?IMPRESSION: ?1. Similar findings in the pelvis consistent with acute perforated ?sigmoid diverticulitis. Three fluid collections/abscesses in the ?lower abdomen and pelvis are not significantly changed compared to ?prior study. ?2. Aortic Atherosclerosis  ? ?Jabier Mutton, MD ?[facility] ?[place] ?[phone] pager    ?01/03/2022, 11:45 AM ? ?

## 2022-01-03 NOTE — Progress Notes (Signed)
Initial Nutrition Assessment ? ?DOCUMENTATION CODES:  ? ?Not applicable ? ?INTERVENTION:  ? ?Multivitamin w/ minerals daily ?Advance diet as medically appropriate per MD ?Recommend obtaining new weight. RN notified.  ? ?NUTRITION DIAGNOSIS:  ? ?Inadequate oral intake related to inability to eat as evidenced by NPO status. ? ?GOAL:  ? ?Patient will meet greater than or equal to 90% of their needs ? ?MONITOR:  ? ?Diet advancement, Weight trends ? ?REASON FOR ASSESSMENT:  ? ?Malnutrition Screening Tool ?  ? ?ASSESSMENT:  ? ?76 y.o. female presented to the ED with abdominal pain, poor appetite, loose stools, and a fever. PMH includes COPD, HTN, CKD IIIb, GERD, and cancer. Pt admitted with diverticulitis w/ possible perforation.  ? ?Pt resting in bed, with family at bedside.  ?Pt was just placed on CPAP machine, pt was able to provide history with help of family. ? ?Pt reports that her appetite has been on and off for about 2 weeks now. States that when she has an appetite she eats really well, between 3-4 meals per day. Pt reports that when it is poor she does not eat much. Pt family reports that she likes Ensure, Boost, [employer], Glucerna; reports that she typically does 2 shakes/day.  ? ?Discussed with pt and family that as diet is advance RD will order ONS for pt. Family agreeable.  ? ?Pt reports that she was 150# and then was hospitalized and dropped to 136# and has been steady around that weight. Per EMR, pt has had a 19% weight loss within 10 months. Pt with no recent weight from this admission.  ? ?Family asked RD about proper diet for pt when at home. Discussed that pt would benefit from a regular diet with no restrictions, especially if the pt is not eating well. Family understanding.  ? ?Medications reviewed and include: Remeron, Protonix, Prednisone, IV antibiotics ?Labs reviewed. ? ?NUTRITION - FOCUSED PHYSICAL EXAM: ? ?Flowsheet Row Most Recent Value  ?Orbital Region Unable to assess   ?Upper Arm Region No depletion  ?Thoracic and Lumbar Region No depletion  ?Buccal Region Unable to assess  ?Temple Region Unable to assess  ?Clavicle Bone Region Mild depletion  ?Clavicle and Acromion Bone Region Mild depletion  ?Scapular Bone Region Mild depletion  ?Dorsal Hand Moderate depletion  ?Patellar Region Moderate depletion  ?Anterior Thigh Region Moderate depletion  ?Posterior Calf Region Moderate depletion  ?Edema (RD Assessment) Mild  ?Hair Reviewed  ?Eyes Reviewed  ?Mouth Unable to assess  ?Skin Reviewed  ?Nails Reviewed  ? ?Diet Order:   ?Diet Order   ? ?       ?  Diet NPO time specified Except for: [employer], Sips with Meds, Other (See Comments)  Diet effective now       ?  ? ?  ?  ? ?  ? ? ?EDUCATION NEEDS:  ? ?No education needs have been identified at this time ? ?Skin:  Skin Assessment: Reviewed RN Assessment ? ?Last BM:  No Documentation ? ?Height:  ?Ht Readings from Last 1 Encounters:  ?12/24/21 5' (1.524 m)  ? ?Weight:  ?Wt Readings from Last 1 Encounters:  ?12/24/21 62.1 kg  ? ?Ideal Body Weight:  45.5 kg ? ?BMI:  There is no height or weight on file to calculate BMI. ? ?Estimated Nutritional Needs:  ? ?Kcal:  1800-2000 ? ?Protein:  90-105 grams ? ?Fluid:  >/= 1.8 L ? ? ? ?Hermina Barters RD, LDN ?Clinical Dietitian ?See AMiON for contact information.  ? ?

## 2022-01-03 NOTE — Progress Notes (Signed)
?      ?                 PROGRESS NOTE ? ?      ?PATIENT DETAILS ?Name: Holly [name] ?Age: 76 y.o. ?Sex: female ?Date of Birth: Jan 17, 1946 ?Admit Date: 01/01/2022 ?Admitting Physician Norval Morton, MD ?TDD:UKGUR, [name], MD ? ?Brief Summary: ?Patient is a 76 y.o.  female with history of HTN, HLD, COPD on 3 L of oxygen at home, lung CA-who recently developed a pathological fracture fracture of the left tibial plateau--s/p ORIF on 2/27 (biopsy negative for malignancy-but cultures positive for MRSA-subsequently started on IV daptomycin)-presented as a transfer from [place] for abdominal pain-upon further evaluation she was found to have sigmoid diverticulitis with contained perforation and abscess formation.  She was subsequently transferred to [facility] for further evaluation and treatment. ? ?Significant events: ?3/18>> transfer from [facility] health-sigmoid diverticulitis with perforation/abscess formation ?3/20>> per IR-no good target for drain placement at this time. ? ?Significant studies: ?3/17>> CT abdomen (done at [facility] health):-diffuse sigmoid diverticulosis with suggestion of acute complicated diverticulitis.  5.4 x 5.6 cm gas/fluid collection in her pelvis.  Inseparable from sigmoid and posterior wall of urinary bladder.  5.6 x 3.5 cm perisigmoid collection ?3/19>> CT abdomen/pelvis: Similar findings in the pelvis compared to prior CT with acute perforated sigmoid diverticulitis. ? ?Significant microbiology data: ? ? ?Procedures: ? ? ?Consults: ?General surgery, ID, IR ? ?Subjective: ?Continues to complain of pain in lower abdominal area. ? ?Objective: ?Vitals: ?Blood pressure (!) 128/55, pulse 85, temperature 98.5 ?F (36.9 ?C), temperature source Oral, resp. rate 16, SpO2 100 %.  ? ?Exam: ?Gen Exam:Alert awake-not in any distress ?HEENT:atraumatic, normocephalic ?Chest: B/L clear to auscultation anteriorly ?CVS:S1S2 regular ?Abdomen: Soft-continues to have tenderness bilaterally in the lower  abdominal area without any peritoneal signs. ?Extremities:no edema ?Neurology: Non focal ?Skin: no rash  ? ?Pertinent Labs/Radiology: ?CBC Latest Ref Rng & Units 01/03/2022 01/02/2022 01/01/2022  ?WBC 4.0 - 10.5 K/uL 16.2(H) 22.5(H) 27.7(H)  ?Hemoglobin 12.0 - 15.0 g/dL 6.1(LL) 7.3(L) 8.4(L)  ?Hematocrit 36.0 - 46.0 % 21.9(L) 25.8(L) 28.2(L)  ?Platelets 150 - 400 K/uL 197 225 284  ?  ?Lab Results  ?Component Value Date  ? NA 140 01/03/2022  ? K 4.4 01/03/2022  ? CL 104 01/03/2022  ? CO2 27 01/03/2022  ?  ? ? ?Assessment/Plan: ?Diverticulitis of sigmoid colon with contained perforation-pelvic abscess: Continues to have lower abdominal pain-leukocytosis downtrending-IR unable to place drain-General surgery following-recommendations are to continue n.p.o. status-continue IV antibiotics and follow clinically.   ? 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 Julieta Bellini daptomycin.  Biopsies were negative for malignancy. ? ?Normocytic anemia: No evidence of blood loss-suspect decrease in hemoglobin due to IV fluid dilution and acute illness-we will need 1 unit of PRBC transfusion today.  Repeat posttransfusion CBC and follow closely. ? ?CKD stage IIIb: Close to baseline-continue to monitor closely. ? ?Hypothyroidism: Continue Synthroid ? ?GERD: Continue PPI ? 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  ? ?History of lung cancer: Follow with medical oncology/radiation oncology-currently stable. ? ?OSA: Continue CPAP nightly ? ?Anxiety: Relatively stable-continue BuSpar, Wellbutrin and as needed Xanax. ? ?BMI: ?Estimated body mass index is 26.76 kg/m? as calculated from the following: ?  Height as of 12/24/21: 5' (1.524 m). ?  Weight as of 12/24/21: 62.1 kg.  ? ?Code status: ?  Code Status: DNR  ? ?DVT  Prophylaxis: ?enoxaparin (LOVENOX) injection 40 mg Start: 01/03/22 1000 ?Place and maintain sequential compression device  Start: 01/01/22 1353 ?  ?Family Communication: Daughter at bedside ? ? ?Disposition Plan: ?Status is: Inpatient ?Remains inpatient appropriate because: Pelvic abscess-perforated sigmoid diverticulitis-awaiting IR drain placement. ?  ?Planned Discharge Destination:Home health ? ? ?Diet: ?Diet Order   ? ?       ?  Diet NPO time specified Except for: [employer], Sips with Meds, Other (See Comments)  Diet effective now       ?  ? ?  ?  ? ?  ?  ? ? ?Antimicrobial agents: ?Anti-infectives (From admission, onward)  ? ? Start     Dose/Rate Route Frequency Ordered Stop  ? 01/01/22 2000  DAPTOmycin (CUBICIN) injection 500 mg  Status:  Discontinued       ? 10 mL Intravenous Daily 01/01/22 1400 01/01/22 1425  ? 01/01/22 1515  DAPTOmycin (CUBICIN) 500 mg in sodium chloride 0.9 % IVPB       ? 8 mg/kg ? 62.1 kg ?120 mL/hr over 30 Minutes Intravenous Daily 01/01/22 1425    ? 01/01/22 1430  cefTRIAXone (ROCEPHIN) 2 g in sodium chloride 0.9 % 100 mL IVPB       ? 2 g ?200 mL/hr over 30 Minutes Intravenous Every 24 hours 01/01/22 1344    ? 01/01/22 1430  metroNIDAZOLE (FLAGYL) IVPB 500 mg       ? 500 mg ?100 mL/hr over 60 Minutes Intravenous Every 12 hours 01/01/22 1344    ? 01/01/22 1415  piperacillin-tazobactam (ZOSYN) IVPB 3.375 g  Status:  Discontinued       ? 3.375 g ?12.5 mL/hr over 240 Minutes Intravenous Every 8 hours 01/01/22 1329 01/01/22 1344  ? 01/01/22 1400  piperacillin-tazobactam (ZOSYN) IVPB 3.375 g  Status:  Discontinued       ? 3.375 g ?100 mL/hr over 30 Minutes Intravenous Every 8 hours 01/01/22 1309 01/01/22 1328  ? ?  ? ? ? ?MEDICATIONS: ?Scheduled Meds: ? atorvastatin  20 mg Oral QHS  ? buPROPion  300 mg Oral q morning  ? busPIRone  5 mg Oral BID  ? chlorhexidine  15 mL Mouth Rinse BID  ? Chlorhexidine Gluconate Cloth  6 each Topical Daily  ? donepezil  10 mg Oral QHS  ? enoxaparin (LOVENOX) injection  40 mg Subcutaneous Daily  ? fluticasone furoate-vilanterol  1 puff Inhalation Daily  ? And  ? umeclidinium bromide   1 puff Inhalation Daily  ? levothyroxine  137 mcg Oral Q0600  ? mouth rinse  15 mL Mouth Rinse q12n4p  ? mirtazapine  30 mg Oral QHS  ? pantoprazole  40 mg Oral BID  ? predniSONE  20 mg Oral Daily  ? sodium chloride flush  3 mL Intravenous Q12H  ? ?Continuous Infusions: ? sodium chloride 100 mL/hr at 01/02/22 1341  ? cefTRIAXone (ROCEPHIN)  IV 2 g (01/02/22 1347)  ? DAPTOmycin (CUBICIN)  IV 500 mg (01/02/22 2108)  ? metronidazole 500 mg (01/03/22 0139)  ? ?PRN Meds:.acetaminophen **OR** acetaminophen, albuterol, ALPRAZolam, HYDROmorphone (DILAUDID) injection ? ? ?I have personally reviewed following labs and imaging studies ? ?LABORATORY DATA: ?CBC: ?Recent Labs  ?Lab 01/01/22 ?1222 01/02/22 ?0400 01/03/22 ?2536  ?WBC 27.7* 22.5* 16.2*  ?NEUTROABS 24.8*  --   --   ?HGB 8.4* 7.3* 6.1*  ?HCT 28.2* 25.8* 21.9*  ?MCV 95.3 95.6 97.3  ?PLT 284 225 197  ? ? ? ?Basic Metabolic Panel: ?Recent Labs  ?Lab 01/01/22 ?  1222 01/02/22 ?0400 01/03/22 ?5597  ?NA 138 135 140  ?K 4.3 4.2 4.4  ?CL 98 98 104  ?CO2 32 30 27  ?GLUCOSE 159* 116* 97  ?BUN 20 21 19   ?CREATININE 1.29* 1.14* 0.91  ?CALCIUM 9.3 9.3 9.5  ?MG 2.3  --   --   ?PHOS 4.5  --   --   ? ? ? ?GFR: ?Estimated Creatinine Clearance: 42.6 mL/min (by C-G formula based on SCr of 0.91 mg/dL). ? ?Liver Function Tests: ?Recent Labs  ?Lab 01/01/22 ?1222  ?AST 11*  ?ALT 9  ?ALKPHOS 82  ?BILITOT 0.4  ?PROT 5.6*  ?ALBUMIN 1.8*  ? ? ?No results for input(s): LIPASE, AMYLASE in the last 168 hours. ?No results for input(s): AMMONIA in the last 168 hours. ? ?Coagulation Profile: ?Recent Labs  ?Lab 01/03/22 ?4163  ?INR 1.2  ? ? ?Cardiac Enzymes: ?Recent Labs  ?Lab 01/02/22 ?0400  ?CKTOTAL <5*  ? ? ? ?BNP (last 3 results) ?No results for input(s): PROBNP in the last 8760 hours. ? ?Lipid Profile: ?No results for input(s): CHOL, HDL, LDLCALC, TRIG, CHOLHDL, LDLDIRECT in the last 72 hours. ? ?Thyroid Function Tests: ?No results for input(s): TSH, T4TOTAL, FREET4, T3FREE, THYROIDAB in the last  72 hours. ? ?Anemia Panel: ?No results for input(s): VITAMINB12, FOLATE, FERRITIN, TIBC, IRON, RETICCTPCT in the last 72 hours. ? ?Urine analysis: ?   ?Component Value Date/Time  ? COLORURINE YELLOW 08/30/19

## 2022-01-03 NOTE — Progress Notes (Signed)
? ?  Request made for sigmoid diverticular abscess drain ? ?Dr Denna Haggard has reviewed imaging ?CT Yesterday: IMPRESSION: ?1. Similar findings in the pelvis consistent with acute perforated ?sigmoid diverticulitis. Three fluid collections/abscesses in the ?lower abdomen and pelvis are not significantly changed compared to ?prior study. ? ?No safe window for any of the small abscesses ?And ?On collection is likely ascites---  ?No drains to be placed today ? ?Dr Sloan Leiter made aware ?

## 2022-01-04 ENCOUNTER — Encounter (HOSPITAL_COMMUNITY): Payer: Self-pay | Admitting: Internal Medicine

## 2022-01-04 ENCOUNTER — Inpatient Hospital Stay: Payer: Medicare Other | Admitting: Internal Medicine

## 2022-01-04 ENCOUNTER — Encounter (HOSPITAL_COMMUNITY)
Admission: AD | Disposition: A | Discharge status: Rehabilitation Facility | Payer: Self-pay | Source: Other Acute Inpatient Hospital | Attending: Internal Medicine

## 2022-01-04 ENCOUNTER — Inpatient Hospital Stay: Payer: Medicare Other

## 2022-01-04 ENCOUNTER — Inpatient Hospital Stay (HOSPITAL_COMMUNITY): Payer: Medicare Other | Admitting: Anesthesiology

## 2022-01-04 ENCOUNTER — Other Ambulatory Visit: Payer: Self-pay

## 2022-01-04 DIAGNOSIS — J42 Unspecified chronic bronchitis: Secondary | ICD-10-CM | POA: Diagnosis not present

## 2022-01-04 DIAGNOSIS — E890 Postprocedural hypothyroidism: Secondary | ICD-10-CM | POA: Diagnosis not present

## 2022-01-04 DIAGNOSIS — E785 Hyperlipidemia, unspecified: Secondary | ICD-10-CM | POA: Diagnosis not present

## 2022-01-04 DIAGNOSIS — M869 Osteomyelitis, unspecified: Secondary | ICD-10-CM | POA: Diagnosis not present

## 2022-01-04 DIAGNOSIS — M8618 Other acute osteomyelitis, other site: Secondary | ICD-10-CM | POA: Diagnosis not present

## 2022-01-04 DIAGNOSIS — J449 Chronic obstructive pulmonary disease, unspecified: Secondary | ICD-10-CM

## 2022-01-04 DIAGNOSIS — I5033 Acute on chronic diastolic (congestive) heart failure: Secondary | ICD-10-CM | POA: Diagnosis not present

## 2022-01-04 DIAGNOSIS — I129 Hypertensive chronic kidney disease with stage 1 through stage 4 chronic kidney disease, or unspecified chronic kidney disease: Secondary | ICD-10-CM | POA: Diagnosis not present

## 2022-01-04 DIAGNOSIS — K5720 Diverticulitis of large intestine with perforation and abscess without bleeding: Secondary | ICD-10-CM | POA: Diagnosis not present

## 2022-01-04 DIAGNOSIS — I10 Essential (primary) hypertension: Secondary | ICD-10-CM

## 2022-01-04 DIAGNOSIS — J9611 Chronic respiratory failure with hypoxia: Secondary | ICD-10-CM | POA: Diagnosis not present

## 2022-01-04 DIAGNOSIS — F418 Other specified anxiety disorders: Secondary | ICD-10-CM

## 2022-01-04 HISTORY — PX: LAPAROTOMY: SHX154

## 2022-01-04 HISTORY — PX: COLOSTOMY: SHX63

## 2022-01-04 HISTORY — PX: PARTIAL COLECTOMY: SHX5273

## 2022-01-04 LAB — POCT I-STAT, CHEM 8
BUN: 17 mg/dL (ref 8–23)
BUN: 18 mg/dL (ref 8–23)
BUN: 17 mg/dL (ref 8–23)
Calcium, Ion: 1.27 mmol/L (ref 1.15–1.40)
Calcium, Ion: 1.35 mmol/L (ref 1.15–1.40)
Calcium, Ion: 1.25 mmol/L (ref 1.15–1.40)
Chloride: 101 mmol/L (ref 98–111)
Chloride: 99 mmol/L (ref 98–111)
Chloride: 99 mmol/L (ref 98–111)
Creatinine, Ser: 0.9 mg/dL (ref 0.44–1.00)
Creatinine, Ser: 1 mg/dL (ref 0.44–1.00)
Creatinine, Ser: 1 mg/dL (ref 0.44–1.00)
Glucose, Bld: 119 mg/dL — ABNORMAL HIGH (ref 70–99)
Glucose, Bld: 137 mg/dL — ABNORMAL HIGH (ref 70–99)
Glucose, Bld: 150 mg/dL — ABNORMAL HIGH (ref 70–99)
HCT: 23 % — ABNORMAL LOW (ref 36.0–46.0)
HCT: 28 % — ABNORMAL LOW (ref 36.0–46.0)
HCT: 29 % — ABNORMAL LOW (ref 36.0–46.0)
Hemoglobin: 7.8 g/dL — ABNORMAL LOW (ref 12.0–15.0)
Hemoglobin: 9.5 g/dL — ABNORMAL LOW (ref 12.0–15.0)
Hemoglobin: 9.9 g/dL — ABNORMAL LOW (ref 12.0–15.0)
Potassium: 3.8 mmol/L (ref 3.5–5.1)
Potassium: 3.9 mmol/L (ref 3.5–5.1)
Potassium: 4.2 mmol/L (ref 3.5–5.1)
Sodium: 139 mmol/L (ref 135–145)
Sodium: 139 mmol/L (ref 135–145)
Sodium: 139 mmol/L (ref 135–145)
TCO2: 28 mmol/L (ref 22–32)
TCO2: 30 mmol/L (ref 22–32)
TCO2: 27 mmol/L (ref 22–32)

## 2022-01-04 LAB — GLUCOSE, CAPILLARY
Glucose-Capillary: 74 mg/dL (ref 70–99)
Glucose-Capillary: 167 mg/dL — ABNORMAL HIGH (ref 70–99)

## 2022-01-04 LAB — CBC
HCT: 31 % — ABNORMAL LOW (ref 36.0–46.0)
Hemoglobin: 9.1 g/dL — ABNORMAL LOW (ref 12.0–15.0)
MCH: 27.6 pg (ref 26.0–34.0)
MCHC: 29.4 g/dL — ABNORMAL LOW (ref 30.0–36.0)
MCV: 93.9 fL (ref 80.0–100.0)
Platelets: 246 10*3/uL (ref 150–400)
RBC: 3.3 MIL/uL — ABNORMAL LOW (ref 3.87–5.11)
RDW: 18.2 % — ABNORMAL HIGH (ref 11.5–15.5)
WBC: 15.4 10*3/uL — ABNORMAL HIGH (ref 4.0–10.5)
nRBC: 0.2 % (ref 0.0–0.2)

## 2022-01-04 LAB — BASIC METABOLIC PANEL
Anion gap: 9 (ref 5–15)
BUN: 18 mg/dL (ref 8–23)
CO2: 29 mmol/L (ref 22–32)
Calcium: 9.6 mg/dL (ref 8.9–10.3)
Chloride: 100 mmol/L (ref 98–111)
Creatinine, Ser: 0.91 mg/dL (ref 0.44–1.00)
GFR, Estimated: >60 mL/min (ref >60)
Glucose, Bld: 82 mg/dL (ref 70–99)
Potassium: 3.7 mmol/L (ref 3.5–5.1)
Sodium: 138 mmol/L (ref 135–145)

## 2022-01-04 LAB — TYPE AND SCREEN
ABO/RH(D): O POS
Antibody Screen: NEGATIVE

## 2022-01-04 LAB — HEMOGLOBIN AND HEMATOCRIT, BLOOD
HCT: 26.5 % — ABNORMAL LOW (ref 36.0–46.0)
Hemoglobin: 8 g/dL — ABNORMAL LOW (ref 12.0–15.0)

## 2022-01-04 LAB — PREPARE RBC (CROSSMATCH)

## 2022-01-04 SURGERY — LAPAROTOMY, EXPLORATORY
Anesthesia: General | Site: Abdomen

## 2022-01-04 MED ORDER — ONDANSETRON 4 MG PO TBDP: 4.0000 mg | ORAL_TABLET | Freq: Four times a day (QID) | ORAL | Status: DC | PRN

## 2022-01-04 MED ORDER — PROCHLORPERAZINE EDISYLATE 10 MG/2ML IJ SOLN: 10.0000 mg | Freq: Four times a day (QID) | INTRAMUSCULAR | Status: DC | PRN

## 2022-01-04 MED ORDER — SUCCINYLCHOLINE CHLORIDE 200 MG/10ML IV SOSY
PREFILLED_SYRINGE | INTRAVENOUS | Status: AC
  Filled 2022-01-04: qty 30

## 2022-01-04 MED ORDER — PROPOFOL 10 MG/ML IV BOLUS
INTRAVENOUS | Status: DC | PRN
  Administered 2022-01-04: 50 mg via INTRAVENOUS

## 2022-01-04 MED ORDER — ROCURONIUM BROMIDE 10 MG/ML (PF) SYRINGE
PREFILLED_SYRINGE | INTRAVENOUS | Status: DC | PRN
  Administered 2022-01-04: 20 mg via INTRAVENOUS
  Administered 2022-01-04: 30 mg via INTRAVENOUS
  Administered 2022-01-04: 20 mg via INTRAVENOUS
  Administered 2022-01-04: 50 mg via INTRAVENOUS

## 2022-01-04 MED ORDER — ONDANSETRON HCL 4 MG/2ML IJ SOLN
4.0000 mg | Freq: Four times a day (QID) | INTRAMUSCULAR | Status: DC | PRN
Start: 2022-01-04 — End: 2022-01-12
  Administered 2022-01-04: 4 mg via INTRAVENOUS
  Filled 2022-01-04: qty 2

## 2022-01-04 MED ORDER — ONDANSETRON HCL 4 MG/2ML IJ SOLN: 4.0000 mg | Freq: Four times a day (QID) | INTRAMUSCULAR | Status: DC | PRN

## 2022-01-04 MED ORDER — FENTANYL CITRATE (PF) 250 MCG/5ML IJ SOLN
INTRAMUSCULAR | Status: AC
  Filled 2022-01-04: qty 5

## 2022-01-04 MED ORDER — ALBUMIN HUMAN 5 % IV SOLN: INTRAVENOUS | Status: DC | PRN

## 2022-01-04 MED ORDER — FENTANYL CITRATE (PF) 100 MCG/2ML IJ SOLN
25.0000 ug | INTRAMUSCULAR | Status: DC | PRN
  Administered 2022-01-04 (×4): 25 ug via INTRAVENOUS

## 2022-01-04 MED ORDER — PHENYLEPHRINE HCL-NACL 20-0.9 MG/250ML-% IV SOLN
INTRAVENOUS | Status: DC | PRN
  Administered 2022-01-04: 40 ug/min via INTRAVENOUS

## 2022-01-04 MED ORDER — OXYCODONE HCL 5 MG PO TABS
5.0000 mg | ORAL_TABLET | ORAL | Status: DC | PRN
  Administered 2022-01-04 – 2022-01-05 (×2): 5 mg via ORAL
  Administered 2022-01-06 – 2022-01-08 (×8): 10 mg via ORAL
  Administered 2022-01-08: 5 mg via ORAL
  Administered 2022-01-08 – 2022-01-09 (×3): 10 mg via ORAL
  Administered 2022-01-09: 5 mg via ORAL
  Administered 2022-01-09 – 2022-01-12 (×9): 10 mg via ORAL
  Filled 2022-01-04 (×10): qty 2
  Filled 2022-01-04: qty 1
  Filled 2022-01-04 (×5): qty 2
  Filled 2022-01-04: qty 1
  Filled 2022-01-04 (×9): qty 2

## 2022-01-04 MED ORDER — ONDANSETRON HCL 4 MG/2ML IJ SOLN
INTRAMUSCULAR | Status: DC | PRN
  Administered 2022-01-04: 4 mg via INTRAVENOUS

## 2022-01-04 MED ORDER — ROCURONIUM BROMIDE 10 MG/ML (PF) SYRINGE
PREFILLED_SYRINGE | INTRAVENOUS | Status: AC
  Filled 2022-01-04: qty 10

## 2022-01-04 MED ORDER — ONDANSETRON HCL 4 MG/2ML IJ SOLN
INTRAMUSCULAR | Status: AC
  Filled 2022-01-04: qty 4

## 2022-01-04 MED ORDER — ACETAMINOPHEN 500 MG PO TABS
1000.0000 mg | ORAL_TABLET | Freq: Four times a day (QID) | ORAL | Status: DC
  Administered 2022-01-04 – 2022-01-12 (×25): 1000 mg via ORAL
  Filled 2022-01-04 (×26): qty 2

## 2022-01-04 MED ORDER — FENTANYL CITRATE (PF) 250 MCG/5ML IJ SOLN
INTRAMUSCULAR | Status: DC | PRN
  Administered 2022-01-04: 100 ug via INTRAVENOUS
  Administered 2022-01-04: 50 ug via INTRAVENOUS
  Administered 2022-01-04: 100 ug via INTRAVENOUS
  Administered 2022-01-04: 50 ug via INTRAVENOUS

## 2022-01-04 MED ORDER — ENOXAPARIN SODIUM 40 MG/0.4ML IJ SOSY
40.0000 mg | PREFILLED_SYRINGE | Freq: Every day | INTRAMUSCULAR | Status: DC
  Administered 2022-01-05 – 2022-01-11 (×7): 40 mg via SUBCUTANEOUS
  Filled 2022-01-04 (×7): qty 0.4

## 2022-01-04 MED ORDER — HEMOSTATIC AGENTS (NO CHARGE) OPTIME
TOPICAL | Status: DC | PRN
  Administered 2022-01-04: 1 via TOPICAL

## 2022-01-04 MED ORDER — PHENYLEPHRINE 40 MCG/ML (10ML) SYRINGE FOR IV PUSH (FOR BLOOD PRESSURE SUPPORT)
PREFILLED_SYRINGE | INTRAVENOUS | Status: DC | PRN
  Administered 2022-01-04: 80 ug via INTRAVENOUS
  Administered 2022-01-04: 120 ug via INTRAVENOUS
  Administered 2022-01-04: 80 ug via INTRAVENOUS
  Administered 2022-01-04: 120 ug via INTRAVENOUS

## 2022-01-04 MED ORDER — FENTANYL CITRATE (PF) 250 MCG/5ML IJ SOLN
INTRAMUSCULAR | Status: AC
Start: 2022-01-04 — End: ?
  Filled 2022-01-04: qty 5

## 2022-01-04 MED ORDER — SODIUM CHLORIDE 0.9% IV SOLUTION: Freq: Once | INTRAVENOUS | Status: DC

## 2022-01-04 MED ORDER — ROCURONIUM BROMIDE 10 MG/ML (PF) SYRINGE
PREFILLED_SYRINGE | INTRAVENOUS | Status: AC
  Filled 2022-01-04: qty 20

## 2022-01-04 MED ORDER — DEXAMETHASONE SODIUM PHOSPHATE 10 MG/ML IJ SOLN
INTRAMUSCULAR | Status: DC | PRN
  Administered 2022-01-04: 10 mg via INTRAVENOUS

## 2022-01-04 MED ORDER — 0.9 % SODIUM CHLORIDE (POUR BTL) OPTIME
TOPICAL | Status: DC | PRN
  Administered 2022-01-04 (×3): 1000 mL

## 2022-01-04 MED ORDER — PROPOFOL 10 MG/ML IV BOLUS
INTRAVENOUS | Status: AC
  Filled 2022-01-04: qty 20

## 2022-01-04 MED ORDER — MIDAZOLAM HCL 2 MG/2ML IJ SOLN
INTRAMUSCULAR | Status: AC
  Filled 2022-01-04: qty 2

## 2022-01-04 MED ORDER — LACTATED RINGERS IV SOLN: INTRAVENOUS | Status: DC | PRN

## 2022-01-04 MED ORDER — AMISULPRIDE (ANTIEMETIC) 5 MG/2ML IV SOLN
INTRAVENOUS | Status: AC
  Filled 2022-01-04: qty 2

## 2022-01-04 MED ORDER — LIDOCAINE 2% (20 MG/ML) 5 ML SYRINGE
INTRAMUSCULAR | Status: AC
  Filled 2022-01-04: qty 15

## 2022-01-04 MED ORDER — AMISULPRIDE (ANTIEMETIC) 5 MG/2ML IV SOLN
5.0000 mg | Freq: Once | INTRAVENOUS | Status: AC
  Administered 2022-01-04: 5 mg via INTRAVENOUS

## 2022-01-04 MED ORDER — METHOCARBAMOL 1000 MG/10ML IJ SOLN
500.0000 mg | Freq: Four times a day (QID) | INTRAVENOUS | Status: DC | PRN
  Filled 2022-01-04: qty 5

## 2022-01-04 MED ORDER — DEXAMETHASONE SODIUM PHOSPHATE 10 MG/ML IJ SOLN
INTRAMUSCULAR | Status: AC
  Filled 2022-01-04: qty 2

## 2022-01-04 MED ORDER — ONDANSETRON HCL 4 MG/2ML IJ SOLN
INTRAMUSCULAR | Status: AC
  Filled 2022-01-04: qty 2

## 2022-01-04 MED ORDER — LIDOCAINE HCL (CARDIAC) PF 100 MG/5ML IV SOSY
PREFILLED_SYRINGE | INTRAVENOUS | Status: DC | PRN
  Administered 2022-01-04: 60 mg via INTRATRACHEAL

## 2022-01-04 MED ORDER — FENTANYL CITRATE (PF) 100 MCG/2ML IJ SOLN
INTRAMUSCULAR | Status: AC
  Filled 2022-01-04: qty 2

## 2022-01-04 MED ORDER — SUGAMMADEX SODIUM 200 MG/2ML IV SOLN
INTRAVENOUS | Status: DC | PRN
  Administered 2022-01-04: 250 mg via INTRAVENOUS

## 2022-01-04 MED ORDER — SUCCINYLCHOLINE CHLORIDE 200 MG/10ML IV SOSY
PREFILLED_SYRINGE | INTRAVENOUS | Status: DC | PRN
Start: 2022-01-04 — End: 2022-01-04
  Administered 2022-01-04: 60 mg via INTRAVENOUS

## 2022-01-04 MED ORDER — LACTATED RINGERS IV SOLN: INTRAVENOUS | Status: DC

## 2022-01-04 SURGICAL SUPPLY — 55 items
APL PRP STRL LF DISP 70% ISPRP (MISCELLANEOUS) ×1
BIOPATCH RED 1 DISK 7.0 (GAUZE/BANDAGES/DRESSINGS) ×1 IMPLANT
BLADE CLIPPER SURG (BLADE) IMPLANT
CANISTER SUCT 3000ML PPV (MISCELLANEOUS) ×3 IMPLANT
CHLORAPREP W/TINT 26 (MISCELLANEOUS) ×3 IMPLANT
COVER SURGICAL LIGHT HANDLE (MISCELLANEOUS) ×3 IMPLANT
DRAIN CHANNEL 19F RND (DRAIN) ×1 IMPLANT
DRAIN JP 10F RND SILICONE (MISCELLANEOUS) ×1 IMPLANT
DRAIN PENROSE .5X12 LATEX STL (DRAIN) ×1 IMPLANT
DRAPE LAPAROSCOPIC ABDOMINAL (DRAPES) ×3 IMPLANT
DRAPE WARM FLUID 44X44 (DRAPES) ×3 IMPLANT
DRSG OPSITE POSTOP 4X10 (GAUZE/BANDAGES/DRESSINGS) ×1 IMPLANT
DRSG OPSITE POSTOP 4X8 (GAUZE/BANDAGES/DRESSINGS) IMPLANT
DRSG TEGADERM 2-3/8X2-3/4 SM (GAUZE/BANDAGES/DRESSINGS) ×1 IMPLANT
ELECT BLADE 6.5 EXT (BLADE) ×1 IMPLANT
ELECT CAUTERY BLADE 6.4 (BLADE) ×3 IMPLANT
ELECT REM PT RETURN 9FT ADLT (ELECTROSURGICAL) ×2
ELECTRODE REM PT RTRN 9FT ADLT (ELECTROSURGICAL) ×2 IMPLANT
GAUZE SPONGE 4X4 12PLY STRL (GAUZE/BANDAGES/DRESSINGS) ×1 IMPLANT
GLOVE SRG 8 PF TXTR STRL LF DI (GLOVE) ×2 IMPLANT
GLOVE SURG ENC MOIS LTX SZ7.5 (GLOVE) ×3 IMPLANT
GLOVE SURG UNDER POLY LF SZ8 (GLOVE) ×2
GOWN STRL REUS W/ TWL LRG LVL3 (GOWN DISPOSABLE) ×2 IMPLANT
GOWN STRL REUS W/ TWL XL LVL3 (GOWN DISPOSABLE) ×2 IMPLANT
GOWN STRL REUS W/TWL LRG LVL3 (GOWN DISPOSABLE) ×2
GOWN STRL REUS W/TWL XL LVL3 (GOWN DISPOSABLE) ×2
HANDLE SUCTION POOLE (INSTRUMENTS) ×2 IMPLANT
HEMOSTAT ARISTA ABSORB 3G PWDR (HEMOSTASIS) ×1 IMPLANT
KIT BASIN OR (CUSTOM PROCEDURE TRAY) ×3 IMPLANT
KIT OSTOMY DRAINABLE 2.75 STR (WOUND CARE) ×1 IMPLANT
KIT TURNOVER KIT B (KITS) ×3 IMPLANT
LIGASURE IMPACT 36 18CM CVD LR (INSTRUMENTS) ×1 IMPLANT
NS IRRIG 1000ML POUR BTL (IV SOLUTION) ×6 IMPLANT
PACK GENERAL/GYN (CUSTOM PROCEDURE TRAY) ×3 IMPLANT
PAD ARMBOARD 7.5X6 YLW CONV (MISCELLANEOUS) ×3 IMPLANT
PENCIL SMOKE EVACUATOR (MISCELLANEOUS) ×3 IMPLANT
SPECIMEN JAR LARGE (MISCELLANEOUS) IMPLANT
SPONGE T-LAP 18X18 ~~LOC~~+RFID (SPONGE) ×6 IMPLANT
STAPLER CVD CUT GN 40 RELOAD (ENDOMECHANICALS) ×2 IMPLANT
STAPLER CVD CUT GRN 40 RELOAD (ENDOMECHANICALS) IMPLANT
STAPLER PROXIMATE 75MM BLUE (STAPLE) ×1 IMPLANT
STAPLER VISISTAT 35W (STAPLE) ×3 IMPLANT
SUCTION POOLE HANDLE (INSTRUMENTS) ×2
SUT ETHILON 2 0 FS 18 (SUTURE) ×1 IMPLANT
SUT PDS AB 1 TP1 54 (SUTURE) ×6 IMPLANT
SUT PDS AB 2-0 CT1 27 (SUTURE) ×2 IMPLANT
SUT PROLENE 0 CT 1 30 (SUTURE) ×1 IMPLANT
SUT SILK 2 0 SH CR/8 (SUTURE) ×3 IMPLANT
SUT SILK 2 0 TIES 10X30 (SUTURE) ×3 IMPLANT
SUT SILK 3 0 SH CR/8 (SUTURE) ×3 IMPLANT
SUT SILK 3 0 TIES 10X30 (SUTURE) ×3 IMPLANT
SUT VIC AB 2-0 SH 18 (SUTURE) ×3 IMPLANT
TOWEL GREEN STERILE (TOWEL DISPOSABLE) ×3 IMPLANT
TRAY FOLEY MTR SLVR 16FR STAT (SET/KITS/TRAYS/PACK) ×3 IMPLANT
YANKAUER SUCT BULB TIP NO VENT (SUCTIONS) IMPLANT

## 2022-01-04 NOTE — Consult Note (Signed)
Patient receiving care in [facility] [phone]. ?[facility] Nurse requested for preoperative stoma site marking ? ?Discussed surgical procedure and stoma creation with patient.  Explained role of the [facility] nurse team.  Provided the patient with educational booklet and provided samples of pouching options.  ? ?Examined patient lying and sitting in order to place the marking in the patient's visual field, away from any creases or abdominal contour issues and within the rectus muscle.  Attempted to mark above the patient's belt line.  ? ?Marked for colostomy in the LUQ  _5___ cm to the left of the umbilicus and __3__TW above the umbilicus. ? ?Marked for ileostomy in the RUQ  __5__cm to the right of the umbilicus and  __6__ cm above the umbilicus. ? ?Patient's abdomen cleansed with CHG wipes at site markings, allowed to air dry prior to marking.Covered mark with thin film transparent dressing to preserve mark until date of surgery.  ? ?[facility] Nurse team will follow up with patient after surgery for continue ostomy care and teaching.  ? ?Val Riles, RN, MSN, CWOCN, CNS-BC, pager [phone]  ?

## 2022-01-04 NOTE — Transfer of Care (Signed)
Immediate Anesthesia Transfer of Care Note ? ?Patient: Holly [name] ? ?Procedure(s) Performed: EXPLORATORY LAPAROTOMY WITH COLECTOMY AND COLOSTOMY (Abdomen) ? ?Patient Location: PACU ? ?Anesthesia Type:General ? ?Level of Consciousness: patient cooperative and confused ? ?Airway & Oxygen Therapy: Patient Spontanous Breathing and Patient connected to face mask oxygen ? ?Post-op Assessment: Report given to RN and Post -op Vital signs reviewed and stable ? ?Post vital signs: Reviewed and stable ? ?Last Vitals:  ?Vitals Value Taken Time  ?BP 145/60 01/04/22 1453  ?Temp 37.2 ?C 01/04/22 1450  ?Pulse 109 01/04/22 1456  ?Resp 26 01/04/22 1456  ?SpO2 100 % 01/04/22 1456  ?Vitals shown include unvalidated device data. ? ?Last Pain:  ?Vitals:  ? 01/04/22 1050  ?TempSrc: Oral  ?PainSc: 10-Worst pain ever  ?   ? ?Patients Stated Pain Goal: 3 (01/04/22 1050) ? ?Complications: No notable events documented. ?

## 2022-01-04 NOTE — Anesthesia Procedure Notes (Signed)
Procedure Name: Intubation ?Date/Time: 01/04/2022 7:45 AM ?Performed by: Leonor Liv, CRNA ?Pre-anesthesia Checklist: Patient identified, Emergency Drugs available, Suction available and Patient being monitored ?Patient Re-evaluated:Patient Re-evaluated prior to induction ?Oxygen Delivery Method: Circle System Utilized ?Preoxygenation: Pre-oxygenation with 100% oxygen ?Induction Type: IV induction ?Ventilation: Mask ventilation without difficulty ?Laryngoscope Size: Mac and 3 ?Grade View: Grade I ?Tube type: Oral ?Tube size: 7.0 mm ?Number of attempts: 1 ?Airway Equipment and Method: Stylet and Oral airway ?Placement Confirmation: ETT inserted through vocal cords under direct vision, positive ETCO2 and breath sounds checked- equal and bilateral ?Secured at: 21 cm ?Tube secured with: Tape ?Dental Injury: Teeth and Oropharynx as per pre-operative assessment  ? ? ? ? ?

## 2022-01-04 NOTE — Evaluation (Signed)
Occupational Therapy Evaluation ?Patient Details ?Name: Holly [name] ?MRN: [id number] ?DOB: 04-08-1946 ?Today's Date: 01/04/2022 ? ? ?History of Present Illness Pt is a 76 y/o female presenting on 3/18 from [facility] for abdominal pain.  Found with sigmoid diverticulitis with contained perforation and abscess formation. Noted recent L tibial plateau ORIF on 2/27 (osteomyelitis). ?malignancy PMH includes: anemia, basal cell carcinoma/lung cancer, COPD on 3L O2, HTN, PNA, CKD.  ? ?Clinical Impression ?  ?PTA patient living with daughter who assist with ADLs as needed, supervision for squat pivot transfers and using WC for mobility s/p L tibial plateau ORIF.  Patient admitted for above and limited by pain, decreased activity tolerance and anxiety.  She requires +2 total assist for repositioning in bed, setup for grooming tasks but otherwise requires max-total assist for ADLs.  Plan for surgery today and will re-evaluate post op.  OT will follow acutely.   ?   ? ?Recommendations for follow up therapy are one component of a multi-disciplinary discharge planning process, led by the attending physician.  Recommendations may be updated based on patient status, additional functional criteria and insurance authorization.  ? ?Follow Up Recommendations ? Home health OT (pending progress post surgery)  ?  ?Assistance Recommended at Discharge Frequent or constant Supervision/Assistance  ?Patient can return home with the following Two people to help with walking and/or transfers;Two people to help with bathing/dressing/bathroom ? ?  ?Functional Status Assessment ? Patient has had a recent decline in their functional status and demonstrates the ability to make significant improvements in function in a reasonable and predictable amount of time.  ?Equipment Recommendations ? Other (comment) (TBD)  ?  ?Recommendations for Other Services   ? ? ?  ?Precautions / Restrictions Precautions ?Precautions: Fall ?Restrictions ?Weight  Bearing Restrictions: Yes ?LLE Weight Bearing: Non weight bearing  ? ?  ? ?Mobility Bed Mobility ?Overal bed mobility: Needs Assistance ?  ?  ?  ?  ?  ?  ?General bed mobility comments: pt refused rolling or sit eOB due to abd pain; +2 total assist to scoot up in bed ?  ? ?Transfers ?  ?  ?  ?  ?  ?  ?  ?  ?  ?  ?  ? ?  ?Balance   ?  ?  ?  ?  ?  ?  ?  ?  ?  ?  ?  ?  ?  ?  ?  ?  ?  ?  ?   ? ?ADL either performed or assessed with clinical judgement  ? ?ADL Overall ADL's : Needs assistance/impaired ?  ?  ?Grooming: Set up;Bed level ?  ?  ?  ?  ?  ?  ?  ?Lower Body Dressing: Total assistance;Bed level ?  ?  ?  ?  ?  ?  ?  ?  ?General ADL Comments: limited due to pain and refused to roll in bed  ? ? ? ?Vision   ?Vision Assessment?: No apparent visual deficits  ?   ?Perception   ?  ?Praxis   ?  ? ?Pertinent Vitals/Pain Pain Assessment ?Pain Assessment: Faces ?Faces Pain Scale: Hurts whole lot ?Pain Location: abd ?Pain Descriptors / Indicators: Grimacing, Guarding ?Pain Intervention(s): Limited activity within patient's tolerance, Monitored during session, Repositioned  ? ? ? ?Hand Dominance Right ?  ?Extremity/Trunk Assessment Upper Extremity Assessment ?Upper Extremity Assessment: Generalized weakness ?  ?Lower Extremity Assessment ?Lower Extremity Assessment: Defer to PT evaluation ?RLE Deficits / Details:  ROM WFL; hip flexion 2+ (limited by pain); deferred remaining MMT due to abd pain and pt requesting to stop ?RLE: Unable to fully assess due to pain ?LLE Deficits / Details: swelling and brusing left ankle; AROM left knee ~90 flexion; hip flexion 3- (limited by abd pain) ?LLE: Unable to fully assess due to pain ?  ?Cervical / Trunk Assessment ?Cervical / Trunk Assessment: Other exceptions ?Cervical / Trunk Exceptions: overweight ?  ?Communication Communication ?Communication: No difficulties ?  ?Cognition Arousal/Alertness: Awake/alert ?Behavior During Therapy: Anxious ?Overall Cognitive Status: Within Functional  Limits for tasks assessed ?  ?  ?  ?  ?  ?  ?  ?  ?  ?  ?  ?  ?  ?  ?  ?  ?General Comments: anxious re: abd pain and just found out she is having abd surgery today ?  ?  ?General Comments  daughter present and provided home setup.  HR max 137 with minimal activity ? ?  ?Exercises   ?  ?Shoulder Instructions    ? ? ?Home Living Family/patient expects to be discharged to:: Private residence ?Living Arrangements: Children ?Available Help at Discharge: Family;Available PRN/intermittently ?Type of Home: Mobile home ?Home Access: Ramped entrance ?  ?  ?Home Layout: One level ?  ?  ?Bathroom Shower/Tub: Walk-in shower ?  ?Bathroom Toilet: Handicapped height ?  ?  ?Home Equipment: BSC/3in1;Rollator (4 wheels);Grab bars - toilet;Grab bars - tub/shower;Shower seat;Wheelchair - manual;Standard Environmental consultant ?  ?Additional Comments: daughter works fulltime, son assists some ?  ? ?  ?Prior Functioning/Environment Prior Level of Function : Needs assist ?  ?  ?  ?  ?  ?  ?Mobility Comments: has been using wc for mobility; transfers squatting with supervision ?ADLs Comments: uses BSC next to bed, assist for bathing and dressing. ?  ? ?  ?  ?OT Problem List: Decreased strength;Decreased activity tolerance;Pain;Obesity;Decreased knowledge of precautions;Decreased knowledge of use of DME or AE;Decreased safety awareness ?  ?   ?OT Treatment/Interventions: Therapeutic exercise;Self-care/ADL training;DME and/or AE instruction;Therapeutic activities;Patient/family education;Balance training  ?  ?OT Goals(Current goals can be found in the care plan section) Acute Rehab OT Goals ?Patient Stated Goal: home ?OT Goal Formulation: With patient ?Time For Goal Achievement: 01/18/22 ?Potential to Achieve Goals: Good  ?OT Frequency: Min 2X/week ?  ? ?Co-evaluation PT/OT/SLP Co-Evaluation/Treatment: Yes ?Reason for Co-Treatment: Other (comment) (anxiety, movement and pain) ?PT goals addressed during session: Mobility/safety with mobility ?  ?  ? ?   ?AM-PAC OT "6 Clicks" Daily Activity     ?Outcome Measure Help from another person eating meals?: Total ?Help from another person taking care of personal grooming?: A Little ?Help from another person toileting, which includes using toliet, bedpan, or urinal?: Total ?Help from another person bathing (including washing, rinsing, drying)?: A Lot ?Help from another person to put on and taking off regular upper body clothing?: A Lot ?Help from another person to put on and taking off regular lower body clothing?: Total ?6 Click Score: 10 ?  ?End of Session Nurse Communication: Mobility status ? ?Activity Tolerance: Patient limited by pain ?Patient left: in bed;with call bell/phone within reach;with family/visitor present;with bed alarm set ? ?OT Visit Diagnosis: Pain;Muscle weakness (generalized) (M62.81) ?Pain - part of body:  (abdomen)  ?              ?Time: 0944-1000 ?OT Time Calculation (min): 16 min ?Charges:  OT General Charges ?$OT Visit: 1 Visit ?OT Evaluation ?$OT Eval Moderate Complexity: 1  Mod ? ?Jolaine Artist, OT ?Acute Rehabilitation Services ?Pager [phone] ?Office [phone] ? ? ?Delight Stare ?01/04/2022, 11:09 AM ?

## 2022-01-04 NOTE — Op Note (Signed)
? ?Patient: Holly [name] (February 13, 1946, [id number]) ? ?Date of Surgery: 01/04/22 ? ?Preoperative Diagnosis: COMPLICATED DIVERTICULITIS  ? ?Postoperative Diagnosis: COMPLICATED DIVERTICULITIS  ? ?Surgical Procedure:  ?EXPLORATORY LAPAROTOMY WITH SIGMOID COLECTOMY AND COLOSTOMY: 49000 (CPT?)  ?Mobilization of the splenic flexure ? ?Operative Team Members:  ?Surgeon(s) and Role: ?   * Loki Wuthrich, Nickola Major, MD - Primary  ? ?Anesthesiologist: Murvin Natal, MD ?CRNA: Leonor Liv, CRNA  ? ?Anesthesia: General  ? ?Fluids:  ?Total I/O ?In: 2880 [I.V.:1400; Blood:630; IV [id number]:353] ?Out: 1565 [Urine:365; Blood:1200] ? ?Complications: None ? ?Drains:  (19 Fr) Jackson-Pratt drain(s) with closed bulb suction in the RLQ entering the pelvis   ? ?Specimen:  ?ID Type Source Tests Collected by Time Destination  ?1 : Sigmoid colon, stitch marks distal colon Tissue PATH GI Other SURGICAL PATHOLOGY Jaaziah Schulke, Nickola Major, MD 01/04/2022 1310   ?  ? ?Disposition:  PACU - hemodynamically stable. ? ?Plan of Care:  Continue inpatient care ? ? ? ?Indications for Procedure: Holly [name] is a 76 y.o. female who presented with abdominal pain.  Found to have perforated diverticulitis, not amenable to IR drainage.  She failed to improve on antibiotics.  I recommended exploratory laparotomy with sigmoid colectomy and colostomy.   ? ?The procedure itself as well as its risks, benefits and alternatives were discussed.  The risks discussed included but were not limited to the risk of infection, bleeding, damage to nearby structures, and rectal stump leak.  After a full discussion and all questions answered the patient granted consent to proceed. ? ?Findings: Severely inflamed and perforated sigmoid colon with large purulent collection in pelvis ? ? ?Description of Procedure:  ? ?On the date stated above the patient was taken operating room suite and placed in supine position.  General endotracheal anesthesia was induced.  A timeout was completed  verifying the correct patient, procedure, position, and equipment needed for the case.  Antibiotics were given prior to the case start.  The patient's abdomen was prepped and draped in usual sterile fashion. ? ?I made a midline laparotomy incision there is no trauma the underlying viscera with visual entry into the abdomen.  The small bowel was run.  There was a few loops of distal small intestine stuck in the pelvis with secondary inflammation but no pathology to the small intestine.  The small intestine was run from the terminal ileum to the ligament of Treitz.  The right colon and transverse colon appeared healthy.  The sigmoid colon was severely inflamed consistent with perforated diverticulitis.  The pelvis was explored and a purulent abscess cavity was entered deep in the pelvis.  This wrapped around the uterus as well.  This was evacuated and suctioned clean from the abdomen.  I mobilized the sigmoid colon.  Laterally the white line of Toldt was divided and the sigmoid colon was lifted out of the retroperitoneum.  Medially the mesocolon was scored and the sigmoid colon was elevated up into the wound.  The descending colon was divided proximal to the area of inflammation using a GIA 75 mm stapler.  The mesocolon was divided using a LigaSure.  The left ureter was identified and protected during this portion of the procedure.  We stayed close to the colon as this was not considered a oncologic resection.  Once we got below the area of inflammation the rectal stump was divided using the contour stapler.  This division was below the sacral promontory and deep in the pelvis near the peritoneal reflection  of the rectum.  2 Prolene sutures were placed on the lateral aspects of the rectal staple line and left long in order to be identified in the future if needed for colostomy takedown.  Hemostasis was obtained throughout the pelvis.  The ureter was again inspected and was protected throughout the case.  The pelvis  was irrigated.  There was some raw tissues oozing in the pelvis so Arista was sprayed in the pelvis.  A 19 round channel drain was brought through the right abdomen and placed in the pelvis.  It was fixated to the skin using nylon suture.  The splenic flexure of the colon was mobilized.  10 was lifted off the transverse colon distally.  The splenocolic ligament was divided using electrocautery.  The descending colon was lifted out of the retroperitoneum by dividing the white line of Toldt and dividing the retroperitoneal attachments.  This provided good length of proximal descending colon for creation of colostomy.  A defect was created in the anterior abdominal wall through the rectus muscle where the patient been marked in preop by the ostomy nurse team.  A cruciate incision was made in the anterior rectus fascia.  The rectus muscle fibers were spread along their length.  The posterior fascia was divided using electrocautery and the defect was dilated to fit 3 fingers.  The descending colon was delivered through the abdominal wall defect and we directed our attention to closure.  The abdomen was irrigated and suctioned clean.  The midline fascia was closed using 2-0 PDS in running fashion.  The deep dermal tissues were closed using 2-0 Vicryl suture.  Skin was closed with staples.  1/2 inch Penrose drain was placed in the subcutaneous tissues exiting the inferior aspect of the incision.  The ostomy was matured using 4 Brooks style sutures 1 in each quadrant and multiple 2-0 Vicryl sutures to reapproximate the mucosa of the colon to the epidermis.  Sterile dressing and ostomy appliance applied.  All sponge needle counts were correct in this case. ? ? ? ? ?At the end of the case we reviewed the infection status of the case. ?Patient: Holly [name] Emergency General Surgery Service Patient ?Case: Emergent ?Infection Present At Time Of Surgery (PATOS): Purulent Peritonitis ? ?Louanna Raw, MD ?General, Bariatric, &  Minimally Invasive Surgery ?[facility], [place] ? ?

## 2022-01-04 NOTE — Progress Notes (Signed)
OT Cancellation Note ? ?Patient Details ?Name: Holly [name] ?MRN: [id number] ?DOB: Feb 05, 1946 ? ? ?Cancelled Treatment:    Reason Eval/Treat Not Completed: Patient declined, no reason specified- pt declined OOB at this time.  Requesting to wait until after she receives pain medication. Will follow and see as able.  ? ?Jolaine Artist, OT ?Acute Rehabilitation Services ?Pager [phone] ?Office [phone] ? ? ?Delight Stare ?01/04/2022, 8:44 AM ?

## 2022-01-04 NOTE — Progress Notes (Signed)
?      ?                 PROGRESS NOTE ? ?      ?PATIENT DETAILS ?Name: Holly [name] ?Age: 76 y.o. ?Sex: female ?Date of Birth: 1946/02/14 ?Admit Date: 01/01/2022 ?Admitting Physician Norval Morton, MD ?EHU:[id number], [name], MD ? ?Brief Summary: ?Patient is a 76 y.o.  female with history of HTN, HLD, COPD on 3 L of oxygen at home, lung CA, recent left tibial plateau pathological fracture-found to have MRSA on cultures-on IV daptomycin as outpatient -presented to [place] for abdominal pain-upon further evaluation she was found to have perforated sigmoid diverticulitis with abscess formation.  She was then transferred to [facility] for further evaluation and treatment.  See below for further details. ? ?Significant events: ?2/27>> ORIF/biopsy of left tibial plateau fracture (biopsy negative for malignancy-culture positive for MRSA) ?3/18>> transfer from [facility]-sigmoid diverticulitis with perforation/abscess formation ?3/20>> per IR-no good target for drain placement at this time. ?3/21>> worsening abdominal pain-General surgery planning colectomy/ostomy ? ?Significant studies: ?3/17>> CT abdomen (done at [facility]):-diffuse sigmoid diverticulosis with suggestion of acute complicated diverticulitis.  5.4 x 5.6 cm gas/fluid collection in her pelvis.  Inseparable from sigmoid and posterior wall of urinary bladder.  5.6 x 3.5 cm perisigmoid collection ?3/19>> CT abdomen/pelvis: Similar findings in the pelvis compared to prior CT with acute perforated sigmoid diverticulitis. ? ?Significant microbiology data: ?2/27>> culture left proximal tibia: Rare MRSA ? ?Procedures: ?2/27>> ORIF/biopsy of left tibial fracture (Dr. Doreatha Martin) ? ?Consults: ?General surgery, ID, IR ? ?Subjective: ?Continues to have severe pain in the lower abdominal area. ? ?Objective: ?Vitals: ?Blood pressure (!) 165/65, pulse (!) 136, temperature 99.3 ?F (37.4 ?C), temperature source Oral, resp. rate 18, height 5' (1.524 m), weight 62.1 kg,  SpO2 100 %.  ? ?Exam: ?Gen Exam:Alert awake-not in any distress ?HEENT:atraumatic, normocephalic ?Chest: B/L clear to auscultation anteriorly ?CVS:S1S2 regular ?Abdomen: Soft in the upper abdomen-significant guarding in the lower abdomen-will not allow me to perform further exam. ?Extremities:no edema ?Neurology: Non focal ?Skin: no rash  ? ?Pertinent Labs/Radiology: ?CBC Latest Ref Rng & Units 01/04/2022 01/04/2022 01/03/2022  ?WBC 4.0 - 10.5 K/uL 15.4(H) - 16.2(H)  ?Hemoglobin 12.0 - 15.0 g/dL 9.1(L) 8.0(L) 6.1(LL)  ?Hematocrit 36.0 - 46.0 % 31.0(L) 26.5(L) 21.9(L)  ?Platelets 150 - 400 K/uL 246 - 197  ?  ?Lab Results  ?Component Value Date  ? NA 138 01/04/2022  ? K 3.7 01/04/2022  ? CL 100 01/04/2022  ? CO2 29 01/04/2022  ?  ? ? ?Assessment/Plan: ?Perforated sigmoid diverticulitis with pelvic abscess: Initially managed with conservative measures-however in severe pain today-General surgery planning exploratory laparotomy with possible colectomy and colostomy placement.  Remains on IV Rocephin/Flagyl.   ? 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  Follows with Dr. Tommy Medal in the outpatient setting.   ? ?Normocytic anemia: Has anemia of chronic disease at baseline-worsening anemia is due to acute illness.  No evidence of blood loss.   CBC stable-s/p 1 unit of PRBC on 3/20.  ? ?CKD stage IIIb: Close to baseline-continue to monitor closely. ? ?Hypothyroidism: Continue Synthroid ? ?GERD: Continue PPI ? 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  ? ?History of lung cancer: Follow with medical oncology/radiation oncology-currently stable. ? ?OSA: Continue CPAP nightly ? ?Anxiety: Relatively stable-continue BuSpar, Wellbutrin and as needed Xanax. ? ?  BMI: ?Estimated body mass index is 26.76 kg/m? as calculated from the following: ?  Height as of this encounter: 5' (1.524  m). ?  Weight as of this encounter: 62.1 kg.  ? ?Code status: ?  Code Status: DNR  ? ?DVT Prophylaxis: ?enoxaparin (LOVENOX) injection 40 mg Start: 01/03/22 1000 ?Place and maintain sequential compression device Start: 01/01/22 1353 ?  ?Family Communication: None at bedside ? ? ?Disposition Plan: ?Status is: Inpatient ?Remains inpatient appropriate because: Pelvic abscess-perforated sigmoid diverticulitis-no response to conservative measures-plans are for exploratory laparotomy with colectomy/colostomy placement. ?  ?Planned Discharge Destination:Home health ? ? ?Diet: ?Diet Order   ? ?       ?  Diet NPO time specified Except for: [employer], Sips with Meds, Other (See Comments)  Diet effective now       ?  ? ?  ?  ? ?  ?  ? ? ?Antimicrobial agents: ?Anti-infectives (From admission, onward)  ? ? Start     Dose/Rate Route Frequency Ordered Stop  ? 01/01/22 2000  DAPTOmycin (CUBICIN) injection 500 mg  Status:  Discontinued       ? 10 mL Intravenous Daily 01/01/22 1400 01/01/22 1425  ? 01/01/22 1515  [MAR Hold]  DAPTOmycin (CUBICIN) 500 mg in sodium chloride 0.9 % IVPB        (MAR Hold since Tue 01/04/2022 at 1045.Hold Reason: Transfer to a Procedural area)  ? 8 mg/kg ? 62.1 kg ?120 mL/hr over 30 Minutes Intravenous Daily 01/01/22 1425    ? 01/01/22 1430  [MAR Hold]  cefTRIAXone (ROCEPHIN) 2 g in sodium chloride 0.9 % 100 mL IVPB        (MAR Hold since Tue 01/04/2022 at 1045.Hold Reason: Transfer to a Procedural area)  ? 2 g ?200 mL/hr over 30 Minutes Intravenous Every 24 hours 01/01/22 1344    ? 01/01/22 1430  [MAR Hold]  metroNIDAZOLE (FLAGYL) IVPB 500 mg        (MAR Hold since Tue 01/04/2022 at 1045.Hold Reason: Transfer to a Procedural area)  ? 500 mg ?100 mL/hr over 60 Minutes Intravenous Every 12 hours 01/01/22 1344    ? 01/01/22 1415  piperacillin-tazobactam (ZOSYN) IVPB 3.375 g  Status:  Discontinued       ? 3.375 g ?12.5 mL/hr over 240 Minutes Intravenous Every 8 hours 01/01/22 1329 01/01/22 1344  ? 01/01/22 1400   piperacillin-tazobactam (ZOSYN) IVPB 3.375 g  Status:  Discontinued       ? 3.375 g ?100 mL/hr over 30 Minutes Intravenous Every 8 hours 01/01/22 1309 01/01/22 1328  ? ?  ? ? ? ?MEDICATIONS: ?Scheduled Meds: ? sodium chloride   Intravenous Once  ? [MAR Hold] atorvastatin  20 mg Oral QHS  ? [MAR Hold] buPROPion  300 mg Oral q morning  ? [MAR Hold] busPIRone  5 mg Oral BID  ? [MAR Hold] chlorhexidine  15 mL Mouth Rinse BID  ? [MAR Hold] Chlorhexidine Gluconate Cloth  6 each Topical Daily  ? [MAR Hold] donepezil  10 mg Oral QHS  ? [MAR Hold] enoxaparin (LOVENOX) injection  40 mg Subcutaneous Daily  ? [MAR Hold] fluticasone furoate-vilanterol  1 puff Inhalation Daily  ? And  ? [MAR Hold] umeclidinium bromide  1 puff Inhalation Daily  ? [MAR Hold] levothyroxine  137 mcg Oral Q0600  ? [MAR Hold] mouth rinse  15 mL Mouth Rinse q12n4p  ? [MAR Hold] mirtazapine  30 mg Oral QHS  ? [MAR Hold] multivitamin with minerals  1  tablet Oral Daily  ? [MAR Hold] pantoprazole  40 mg Oral BID  ? [MAR Hold] predniSONE  20 mg Oral Daily  ? [MAR Hold] sodium chloride flush  3 mL Intravenous Q12H  ? ?Continuous Infusions: ? sodium chloride 100 mL/hr at 01/02/22 1341  ? [MAR Hold] cefTRIAXone (ROCEPHIN)  IV 2 g (01/03/22 1539)  ? [MAR Hold] DAPTOmycin (CUBICIN)  IV 500 mg (01/03/22 2013)  ? lactated ringers    ? [MAR Hold] metronidazole 500 mg (01/04/22 0147)  ? ?PRN Meds:.0.9 % irrigation (POUR BTL), [MAR Hold] acetaminophen **OR** [MAR Hold] acetaminophen, [MAR Hold] albuterol, [MAR Hold] ALPRAZolam, hemostatic agents (no charge) Optime, [MAR Hold]  HYDROmorphone (DILAUDID) injection, [MAR Hold] sodium chloride flush ? ? ?I have personally reviewed following labs and imaging studies ? ?LABORATORY DATA: ?CBC: ?Recent Labs  ?Lab 01/01/22 ?1222 01/02/22 ?0400 01/03/22 ?0822 01/04/22 ?0022 01/04/22 ?7371  ?WBC 27.7* 22.5* 16.2*  --  15.4*  ?NEUTROABS 24.8*  --   --   --   --   ?HGB 8.4* 7.3* 6.1* 8.0* 9.1*  ?HCT 28.2* 25.8* 21.9* 26.5* 31.0*   ?MCV 95.3 95.6 97.3  --  93.9  ?PLT 284 225 197  --  246  ? ? ? ?Basic Metabolic Panel: ?Recent Labs  ?Lab 01/01/22 ?1222 01/02/22 ?0400 01/03/22 ?0822 01/04/22 ?0626  ?NA 138 135 140 138  ?K 4.3 4.2

## 2022-01-04 NOTE — Progress Notes (Signed)
? ? ?   ?Subjective: ?Feeling acutely worse this morning.  Asking for surgery and a colostomy if needed to get rid of this severe abdominal pain. ? ?ROS: See above, otherwise other systems negative ? ?Objective: ?Vital signs in last 24 hours: ?Temp:  [97.4 ?F (36.3 ?C)-98.9 ?F (37.2 ?C)] 98.9 ?F (37.2 ?C) (03/21 0726) ?Pulse Rate:  [72-103] 103 (03/21 0726) ?Resp:  [16-20] 18 (03/21 0726) ?BP: (113-157)/(49-72) 153/67 (03/21 0726) ?SpO2:  [90 %-100 %] 99 % (03/21 0845) ?Last BM Date : 01/01/22 (pt states she had BM 3 days ago) ? ?Intake/Output from previous day: ?03/20 0701 - 03/21 0700 ?In: 26 [I.V.:10; Blood:20] ?Out: 1400 [Urine:1400] ?Intake/Output this shift: ?No intake/output data recorded. ? ?PE: ?Gen: NAD, but does appear somewhat chronically ill ?Heart: regular ?Lungs: O2 in place, effort nonlabored ?Abd: Rebound tenderness RLQ.  Abdomen soft.  Guarding. ? ?Lab Results:  ?Recent Labs  ?  01/03/22 ?0822 01/04/22 ?0022 01/04/22 ?9622  ?WBC 16.2*  --  15.4*  ?HGB 6.1* 8.0* 9.1*  ?HCT 21.9* 26.5* 31.0*  ?PLT 197  --  246  ? ? ?BMET ?Recent Labs  ?  01/03/22 ?0822 01/04/22 ?2979  ?NA 140 138  ?K 4.4 3.7  ?CL 104 100  ?CO2 27 29  ?GLUCOSE 97 82  ?BUN 19 18  ?CREATININE 0.91 0.91  ?CALCIUM 9.5 9.6  ? ? ?PT/INR ?Recent Labs  ?  01/03/22 ?0643  ?LABPROT 15.2  ?INR 1.2  ? ? ?CMP  ?   ?Component Value Date/Time  ? NA 138 01/04/2022 0557  ? K 3.7 01/04/2022 0557  ? CL 100 01/04/2022 0557  ? CO2 29 01/04/2022 0557  ? GLUCOSE 82 01/04/2022 0557  ? BUN 18 01/04/2022 0557  ? CREATININE 0.91 01/04/2022 0557  ? CREATININE 0.87 12/09/2021 1050  ? CALCIUM 9.6 01/04/2022 0557  ? PROT 5.6 (L) 01/01/2022 1222  ? ALBUMIN 1.8 (L) 01/01/2022 1222  ? AST 11 (L) 01/01/2022 1222  ? AST 12 (L) 12/09/2021 1050  ? ALT 9 01/01/2022 1222  ? ALT 12 12/09/2021 1050  ? ALKPHOS 82 01/01/2022 1222  ? BILITOT 0.4 01/01/2022 1222  ? BILITOT 0.4 12/09/2021 1050  ? GFRNONAA >60 01/04/2022 0557  ? GFRNONAA >60 12/09/2021 1050  ? GFRAA 58 (L)  05/14/2020 1309  ? ?Lipase  ?No results found for: LIPASE ? ? ? ? ?Studies/Results: ?CT ABDOMEN PELVIS W CONTRAST ? ?Result Date: 01/02/2022 ?CLINICAL DATA:  Perforated diverticulitis follow-up. EXAM: CT ABDOMEN AND PELVIS WITH CONTRAST TECHNIQUE: Multidetector CT imaging of the abdomen and pelvis was performed using the standard protocol following bolus administration of intravenous contrast. RADIATION DOSE REDUCTION: This exam was performed according to the departmental dose-optimization program which includes automated exposure control, adjustment of the mA and/or kV according to patient size and/or use of iterative reconstruction technique. CONTRAST:  186mL OMNIPAQUE IOHEXOL 350 MG/ML SOLN COMPARISON:  CT abdomen pelvis dated December 31, 2021. FINDINGS: Lower chest: No acute abnormality. Minimal dependent atelectasis in the posterior right lower lobe. Hepatobiliary: Subcentimeter ill-defined hypodensities in the right liver are unchanged but remain too small to characterize. No new focal liver abnormality. Distended gallbladder containing some excreted contrast. No gallstones, wall thickening, or biliary dilatation. Pancreas: Unremarkable. No pancreatic ductal dilatation or surrounding inflammatory changes. Spleen: Normal in size without focal abnormality. Adrenals/Urinary Tract: Unchanged 1.2 cm left adrenal nodule, characterized as a benign adenoma on prior PET-CT. No further follow-up required. The right adrenal gland is unremarkable. No renal mass, calculi, or hydronephrosis.  Excreted contrast within the bladder. Multiple bladder diverticula. Stomach/Bowel: The stomach is within normal limits. Similar findings in the pelvis consistent with acute perforated sigmoid diverticulitis. Previously seen abscess between the sigmoid colon and bladder currently measures 5.6 x 5.5 cm (series 3, image 60), previously 5.6 x 5.4 cm. This communicates with a more superior loculated fluid collection in the central abdomen  currently measuring 2.7 x 10.3 cm (series 3, image 52), previously 2.6 x 8.4 cm. Additional fluid collection more inferiorly adjacent to the rectosigmoid colon currently measures 3.9 x 6.2 cm (series 3, image 70), previously 3.5 x 6.1 cm. No new fluid collection. No obstruction. Normal appendix. Vascular/Lymphatic: Aortic atherosclerosis. No enlarged abdominal or pelvic lymph nodes. Reproductive: Status post hysterectomy. No adnexal masses. Other: No pneumoperitoneum. Musculoskeletal: No acute or significant osseous findings. IMPRESSION: 1. Similar findings in the pelvis consistent with acute perforated sigmoid diverticulitis. Three fluid collections/abscesses in the lower abdomen and pelvis are not significantly changed compared to prior study. 2. Aortic Atherosclerosis (ICD10-I70.0). Electronically Signed   By: Titus Dubin M.D.   On: 01/02/2022 17:03   ? ?Anti-infectives: ?Anti-infectives (From admission, onward)  ? ? Start     Dose/Rate Route Frequency Ordered Stop  ? 01/01/22 2000  DAPTOmycin (CUBICIN) injection 500 mg  Status:  Discontinued       ? 10 mL Intravenous Daily 01/01/22 1400 01/01/22 1425  ? 01/01/22 1515  DAPTOmycin (CUBICIN) 500 mg in sodium chloride 0.9 % IVPB       ? 8 mg/kg ? 62.1 kg ?120 mL/hr over 30 Minutes Intravenous Daily 01/01/22 1425    ? 01/01/22 1430  cefTRIAXone (ROCEPHIN) 2 g in sodium chloride 0.9 % 100 mL IVPB       ? 2 g ?200 mL/hr over 30 Minutes Intravenous Every 24 hours 01/01/22 1344    ? 01/01/22 1430  metroNIDAZOLE (FLAGYL) IVPB 500 mg       ? 500 mg ?100 mL/hr over 60 Minutes Intravenous Every 12 hours 01/01/22 1344    ? 01/01/22 1415  piperacillin-tazobactam (ZOSYN) IVPB 3.375 g  Status:  Discontinued       ? 3.375 g ?12.5 mL/hr over 240 Minutes Intravenous Every 8 hours 01/01/22 1329 01/01/22 1344  ? 01/01/22 1400  piperacillin-tazobactam (ZOSYN) IVPB 3.375 g  Status:  Discontinued       ? 3.375 g ?100 mL/hr over 30 Minutes Intravenous Every 8 hours 01/01/22 1309  01/01/22 1328  ? ?  ? ? ? ?Assessment/Plan ?Suspected perforated diverticulitis with multiple intra-abdominal fluid collections ?-Pain worsened today.  Asking for colostomy.  I recommended exploratory laparotomy with likely colectomy and colostomy.  The procedure itself as well as its risks, benefits and alternatives were discussed and the patient granted consent to proceed.  We will proceed emergently to the operating room. ? ? ? ?FEN - NPO x sips and chips/ IVFs ?VTE - Lovenox ?ID - cubacin, rocephin, flagyl ? 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 ?I reviewed hospitalist notes, last 24 h vitals and pain scores, last 48 h intake and output, last 24 h labs and trends, and last 24 h imaging results. ? ? LOS: 3 days  ? ? ?Felicie Morn, MD ?[facility] ?01/04/2022, 9:30 AM ?Please see Amion for pager number during day hours 7:00am-4:30pm or 7:00am -11:30am on weekends ? ?

## 2022-01-04 NOTE — Progress Notes (Signed)
?   ? ? ? ? ?[facility] for Infectious Disease ? ?Date of Admission:  01/01/2022    ? ?Lines:  ?3/15-c rue picc single lumen ?  ?Abx: ?3/18-c ceftriaxone/flagyl ?3/15-c daptomycin                                                     ?  ?3/18 ertapenem-->piptazo ?  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 Previously on doxy ?  ?  ?Will continue daptomycin to finish the 6 weeks from start date 3/15 until 4/26. Will need chronic suppression abx likely with doxycycline after that end date ?I agree with Dr Tommy Medal that metastatic cancer in the knee is not ruled out ?  ?  ?#complicated diverticulitis ?She was seen at [facility] for abd pain and found to have 5 cm abscess around ruptured diverticulitis ?IR unable to place drain ?She was started on ertapenem at [facility] but changed to piptazo here ?  ? ?Wbc improving but due to ongoing severe pain, will take her to OR today 3/21 for Hartman's procedure  ?  ?  ?Plan: ?Continue current antimicrobial ?Await surgery today ?Discussed with primary team ?  ? ?Principal Problem: ?  Diverticulitis of intestine with perforation ?Active Problems: ?  Anxiety state ?  COPD exacerbation ([place]) ?  Chronic respiratory failure with hypoxia (H[facility]) ?  GERD (gastroesophageal reflux disease) ?  DNR (do not resuscitate) ?  Malignant neoplasm of middle lobe of right lung ([place]) ?  Osteomyelitis ([place]) ?  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  OSA on CPAP ?  Hypothyroidism ? ? ?Allergies  ?Allergen Reactions  ? Amlodipine Besylate Swelling  ?  Edema  ? Codeine Nausea And Vomiting  ?  Oxycodone-Acetaminophen Nausea And Vomiting  ? ? ?Scheduled Meds: ? [MAR Hold] atorvastatin  20 mg Oral QHS  ? [MAR Hold] buPROPion  300 mg Oral q morning  ? [MAR Hold] busPIRone  5 mg Oral BID  ? [MAR Hold] chlorhexidine  15 mL Mouth Rinse BID  ? [MAR Hold] Chlorhexidine Gluconate Cloth  6 each Topical Daily  ? [MAR Hold] donepezil  10 mg Oral QHS  ? [MAR Hold] enoxaparin (LOVENOX) injection  40 mg Subcutaneous Daily  ? [MAR Hold] fluticasone furoate-vilanterol  1 puff Inhalation Daily  ? And  ? [MAR Hold] umeclidinium bromide  1 puff Inhalation Daily  ? [MAR Hold] levothyroxine  137 mcg Oral Q0600  ? [MAR Hold] mouth rinse  15 mL Mouth Rinse q12n4p  ? [MAR Hold] mirtazapine  30 mg Oral QHS  ? [MAR Hold] multivitamin with minerals  1 tablet Oral Daily  ? [MAR Hold] pantoprazole  40 mg Oral BID  ? [MAR Hold] predniSONE  20 mg Oral Daily  ? [MAR Hold] sodium chloride flush  3 mL Intravenous Q12H  ? ?Continuous Infusions: ? sodium chloride 100 mL/hr at 01/02/22 1341  ? [MAR Hold] cefTRIAXone (ROCEPHIN)  IV 2 g (  01/03/22 1539)  ? [MAR Hold] DAPTOmycin (CUBICIN)  IV 500 mg (01/03/22 2013)  ? lactated ringers    ? [MAR Hold] metronidazole 500 mg (01/04/22 0147)  ? ?PRN Meds:.[MAR Hold] acetaminophen **OR** [MAR Hold] acetaminophen, [MAR Hold] albuterol, [MAR Hold] ALPRAZolam, [MAR Hold]  HYDROmorphone (DILAUDID) injection, [MAR Hold] sodium chloride flush ? ? ?SUBJECTIVE: ?Worsening abd pain ?Surgery to take to or ?Stable hemodynamics, improving wbc, afebrile ? ?Review of Systems: ?ROS ?All other ROS was negative, except mentioned above ? ? ? ? ?OBJECTIVE: ?Vitals:  ? 01/04/22 0400 01/04/22 0726 01/04/22 0845 01/04/22 1050  ?BP: (!) 141/62 (!) 153/67  (!) 165/65  ?Pulse: 79 (!) 103  (!) 136  ?Resp: 16 18  18   ?Temp: 97.6 ?F (36.4 ?C) 98.9 ?F (37.2 ?C)  99.3 ?F (37.4 ?C)  ?TempSrc: Axillary Oral  Oral  ?SpO2: 100% 97% 99% 100%  ?Weight:    62.1 kg  ?Height:    5' (1.524 m)  ? ?Body mass index is 26.76 kg/m?. ? ?Physical  Exam ? ?General/constitutional: no distress, pleasant ?HEENT: Normocephalic, PER, Conj Clear, EOMI, Oropharynx clear ?Neck supple ?CV: rrr no mrg ?Lungs: clear to auscultation, normal respiratory effort ?Abd: tender diffusely no rebound/guarding or distension ?Ext: no edema ?Skin: No Rash ?Neuro: nonfocal ? ? ? ? ?Central line presence: RUE picc site no purulence/tenderness ? ?Lab Results ?Lab Results  ?Component Value Date  ? WBC 15.4 (H) 01/04/2022  ? HGB 9.1 (L) 01/04/2022  ? HCT 31.0 (L) 01/04/2022  ? MCV 93.9 01/04/2022  ? PLT 246 01/04/2022  ?  ?Lab Results  ?Component Value Date  ? CREATININE 0.91 01/04/2022  ? BUN 18 01/04/2022  ? NA 138 01/04/2022  ? K 3.7 01/04/2022  ? CL 100 01/04/2022  ? CO2 29 01/04/2022  ?  ?Lab Results  ?Component Value Date  ? ALT 9 01/01/2022  ? AST 11 (L) 01/01/2022  ? ALKPHOS 82 01/01/2022  ? BILITOT 0.4 01/01/2022  ?  ? ? ?Microbiology: ?No results found for this or any previous visit (from the past 240 hour(s)). ? ? ?Serology: ? ? ?Imaging: ?If present, new imagings (plain films, ct scans, and mri) have been personally visualized and interpreted; radiology reports have been reviewed. Decision making incorporated into the Impression / Recommendations. ? ?3/19 ct abd/pelv ?Stomach/Bowel: The stomach is within normal limits. Similar findings ?in the pelvis consistent with acute perforated sigmoid ?diverticulitis. Previously seen abscess between the sigmoid colon ?and bladder currently measures 5.6 x 5.5 cm (series 3, image 60), ?previously 5.6 x 5.4 cm. This communicates with a more superior ?loculated fluid collection in the central abdomen currently ?measuring 2.7 x 10.3 cm (series 3, image 52), previously 2.6 x 8.4 ?cm. Additional fluid collection more inferiorly adjacent to the ?rectosigmoid colon currently measures 3.9 x 6.2 cm (series 3, image ?70), previously 3.5 x 6.1 cm. No new fluid collection. No ?obstruction. Normal appendix. ?  ?Vascular/Lymphatic: Aortic  atherosclerosis. No enlarged abdominal or ?pelvic lymph nodes. ?  ?Reproductive: Status post hysterectomy. No adnexal masses. ?  ?Other: No pneumoperitoneum. ?  ?Musculoskeletal: No acute or significant osseous findings. ?  ?IMPRESSION: ?1. Similar findings in the pelvis consistent with acute perforated ?sigmoid diverticulitis. Three fluid collections/abscesses in the ?lower abdomen and pelvis are not significantly changed compared to ?prior study. ?2. Aortic Atherosclerosis  ? ?Jabier Mutton, MD ?[facility] for Infectious Disease ?[place] ?[phone] pager    ?01/04/2022, 11:56 AM ? ?

## 2022-01-04 NOTE — Anesthesia Preprocedure Evaluation (Signed)
Anesthesia Evaluation  ?Patient identified by MRN, date of birth, ID band ?Patient awake ? ? ? ?Reviewed: ?Allergy & Precautions, H&P , NPO status , Patient's Chart, lab work & pertinent test results ? ?Airway ?Mallampati: II ? ? ?Neck ROM: full ? ? ? Dental ?  ?Pulmonary ?shortness of breath, asthma , sleep apnea , COPD, former smoker,  ?  ?breath sounds clear to auscultation ? ? ? ? ? ? Cardiovascular ?hypertension,  ?Rhythm:regular Rate:Normal ? ?TTE (07/2021): EF 65-70% ?  ?Neuro/Psych ? Headaches, PSYCHIATRIC DISORDERS Anxiety Depression   ? GI/Hepatic ?GERD  ,diverticulitis ?  ?Endo/Other  ?Hypothyroidism  ? Renal/GU ?  ? ?  ?Musculoskeletal ? ? Abdominal ?  ?Peds ? Hematology ? ?(+) Blood dyscrasia, anemia ,   ?Anesthesia Other Findings ? ? Reproductive/Obstetrics ? ?  ? ? ? ? ? ? ? ? ? ? ? ? ? ?  ?  ? ? ? ? ? ? ? ? ?Anesthesia Physical ?Anesthesia Plan ? ?ASA: 3 ? ?Anesthesia Plan: General  ? ?Post-op Pain Management:   ? ?Induction: Intravenous ? ?PONV Risk Score and Plan: 3 and Ondansetron, Dexamethasone and Treatment may vary due to age or medical condition ? ?Airway Management Planned: Oral ETT ? ?Additional Equipment:  ? ?Intra-op Plan:  ? ?Post-operative Plan: Extubation in OR ? ?Informed Consent: I have reviewed the patients History and Physical, chart, labs and discussed the procedure including the risks, benefits and alternatives for the proposed anesthesia with the patient or authorized representative who has indicated his/her understanding and acceptance.  ? ? ? ?Dental advisory given ? ?Plan Discussed with: CRNA, Anesthesiologist and Surgeon ? ?Anesthesia Plan Comments:   ? ? ? ? ? ? ?Anesthesia Quick Evaluation ? ?

## 2022-01-04 NOTE — Anesthesia Postprocedure Evaluation (Signed)
Anesthesia Post Note ? ?Patient: Holly [name] ? ?Procedure(s) Performed: EXPLORATORY LAPAROTOMY (Abdomen) ?COLOSTOMY (Abdomen) ?SIGMOID COLECTOMY (Abdomen) ? ?  ? ?Patient location during evaluation: PACU ?Anesthesia Type: General ?Level of consciousness: awake ?Pain management: pain level controlled ?Vital Signs Assessment: post-procedure vital signs reviewed and stable ?Respiratory status: spontaneous breathing, nonlabored ventilation, respiratory function stable and patient connected to nasal cannula oxygen ?Cardiovascular status: blood pressure returned to baseline and stable ?Postop Assessment: no apparent nausea or vomiting ?Anesthetic complications: no ? ? ?No notable events documented. ? ?Last Vitals:  ?Vitals:  ? 01/04/22 1700 01/04/22 1938  ?BP: 140/61 137/70  ?Pulse:  (!) 123  ?Resp:  (!) 21  ?Temp: 36.7 ?C 37.1 ?C  ?SpO2:  97%  ?  ?Last Pain:  ?Vitals:  ? 01/04/22 2030  ?TempSrc:   ?PainSc: Asleep  ? ? ?  ?  ?  ?  ?  ?  ? ?Kaelob Persky P Phelicia Dantes ? ? ? ? ?

## 2022-01-04 NOTE — Evaluation (Signed)
Physical Therapy Evaluation ?Patient Details ?Name: Holly [name] ?MRN: [id number] ?DOB: September 17, 1946 ?Today's Date: 01/04/2022 ? ?History of Present Illness ? Pt is a 76 y/o female presenting on 3/18 from [facility] for abdominal pain.  Found with sigmoid diverticulitis with contained perforation and abscess formation. Noted recent L tibial plateau ORIF on 2/27 (osteomyelitis). ?malignancy PMH includes: anemia, basal cell carcinoma/lung cancer, COPD on 3L O2, HTN, PNA, CKD.  ?Clinical Impression ?  ?Pt admitted secondary to problem above with deficits below. PTA patient was living at home with family support nearly 24/7. She required min assist for ADLs and minguard for squat-pivot transfers to wheelchair. She was using w/c for locomotion.  Pt currently requires +2 total assist for moving in bed due to abd pain and anxiety related to anticipated pain. Will be going to surgery today and will re-evaluate after abd surgery.  Anticipate patient will benefit from PT to address problems listed below.Will continue to follow acutely to maximize functional mobility independence and safety.   ?   ?   ? ?Recommendations for follow up therapy are one component of a multi-disciplinary discharge planning process, led by the attending physician.  Recommendations may be updated based on patient status, additional functional criteria and insurance authorization. ? ?Follow Up Recommendations Other (comment) (TBD on re-eval after surgery) ? ?  ?Assistance Recommended at Discharge Intermittent Supervision/Assistance  ?Patient can return home with the following ? Other (comment) (TBD) ? ?  ?Equipment Recommendations None recommended by PT  ?Recommendations for Other Services ?    ?  ?Functional Status Assessment Patient has had a recent decline in their functional status and demonstrates the ability to make significant improvements in function in a reasonable and predictable amount of time.  ? ?  ?Precautions / Restrictions  Precautions ?Precautions: Fall ?Restrictions ?Weight Bearing Restrictions: Yes ?LLE Weight Bearing: Non weight bearing  ? ?  ? ?Mobility ? Bed Mobility ?Overal bed mobility: Needs Assistance ?  ?  ?  ?  ?  ?  ?General bed mobility comments: pt refused rolling or sit eOB due to abd pain; +2 total assist to scoot up in bed ?  ? ?Transfers ?  ?  ?  ?  ?  ?  ?  ?  ?  ?  ?  ? ?Ambulation/Gait ?  ?  ?  ?  ?  ?  ?  ?  ? ?Stairs ?  ?  ?  ?  ?  ? ?Wheelchair Mobility ?  ? ?Modified Rankin (Stroke Patients Only) ?  ? ?  ? ?Balance   ?  ?  ?  ?  ?  ?  ?  ?  ?  ?  ?  ?  ?  ?  ?  ?  ?  ?  ?   ? ? ? ?Pertinent Vitals/Pain Pain Assessment ?Pain Assessment: Faces ?Faces Pain Scale: Hurts whole lot ?Pain Location: abd ?Pain Descriptors / Indicators: Grimacing, Guarding ?Pain Intervention(s): Limited activity within patient's tolerance, Monitored during session, Premedicated before session, Repositioned  ? ? ?Home Living Family/patient expects to be discharged to:: Private residence ?Living Arrangements: Children ?Available Help at Discharge: Family;Available PRN/intermittently ?Type of Home: Mobile home ?Home Access: Ramped entrance ?  ?  ?  ?Home Layout: One level ?Home Equipment: BSC/3in1;Rollator (4 wheels);Grab bars - toilet;Grab bars - tub/shower;Shower seat;Wheelchair - manual;Standard Environmental consultant ?Additional Comments: daughter works fulltime, son assists some  ?  ?Prior Function Prior Level of Function : Needs assist ?  ?  ?  ?  ?  ?  ?  Mobility Comments: has been using wc for mobility; transfers squatting with supervision ?ADLs Comments: uses BSC next to bed, assist for bathing and dressing. ?  ? ? ?Hand Dominance  ? Dominant Hand: Right ? ?  ?Extremity/Trunk Assessment  ? Upper Extremity Assessment ?Upper Extremity Assessment: Defer to OT evaluation ?  ? ?Lower Extremity Assessment ?Lower Extremity Assessment: RLE deficits/detail;LLE deficits/detail ?RLE Deficits / Details: ROM WFL; hip flexion 2+ (limited by pain); deferred  remaining MMT due to abd pain and pt requesting to stop ?RLE: Unable to fully assess due to pain ?LLE Deficits / Details: swelling and brusing left ankle; AROM left knee ~90 flexion; hip flexion 3- (limited by abd pain) ?LLE: Unable to fully assess due to pain ?  ? ?Cervical / Trunk Assessment ?Cervical / Trunk Assessment: Other exceptions ?Cervical / Trunk Exceptions: overweight  ?Communication  ? Communication: No difficulties  ?Cognition Arousal/Alertness: Awake/alert ?Behavior During Therapy: Anxious ?Overall Cognitive Status: Within Functional Limits for tasks assessed ?  ?  ?  ?  ?  ?  ?  ?  ?  ?  ?  ?  ?  ?  ?  ?  ?General Comments: anxious re: abd pain and just found out she is having abd surgery today ?  ?  ? ?  ?General Comments General comments (skin integrity, edema, etc.): Daughter present and confirmed home set-up and pt prior functional status. HR max 137 (?pain and anxiety) ? ?  ?Exercises General Exercises - Lower Extremity ?Ankle Circles/Pumps: AROM, Both, 10 reps  ? ?Assessment/Plan  ?  ?PT Assessment Patient needs continued PT services  ?PT Problem List Decreased strength;Decreased range of motion;Decreased activity tolerance;Decreased mobility;Decreased knowledge of use of DME;Cardiopulmonary status limiting activity;Obesity;Pain ? ?   ?  ?PT Treatment Interventions DME instruction;Therapeutic activities;Gait training;Therapeutic exercise;Patient/family education;Balance training;Functional mobility training   ? ?PT Goals (Current goals can be found in the Care Plan section)  ?Acute Rehab PT Goals ?Patient Stated Goal: return home ?PT Goal Formulation: With patient/family ?Time For Goal Achievement: 01/18/22 ?Potential to Achieve Goals: Good ? ?  ?Frequency Min 3X/week ?  ? ? ?Co-evaluation PT/OT/SLP Co-Evaluation/Treatment: Yes ?Reason for Co-Treatment: Other (comment) (pt anxiety re: movement and pain) ?PT goals addressed during session: Mobility/safety with mobility;Strengthening/ROM ?  ?   ? ? ?  ?AM-PAC PT "6 Clicks" Mobility  ?Outcome Measure Help needed turning from your back to your side while in a flat bed without using bedrails?: Total ?Help needed moving from lying on your back to sitting on the side of a flat bed without using bedrails?: Total ?Help needed moving to and from a bed to a chair (including a wheelchair)?: Total ?Help needed standing up from a chair using your arms (e.g., wheelchair or bedside chair)?: Total ?Help needed to walk in hospital room?: Total ?Help needed climbing 3-5 steps with a railing? : Total ?6 Click Score: 6 ? ?  ?End of Session   ?Activity Tolerance: Patient limited by pain;Other (comment) (and anxiety re: pain/surgery) ?Patient left: in bed;with call bell/phone within reach;with bed alarm set;with family/visitor present ?  ?PT Visit Diagnosis: History of falling (Z91.81);Difficulty in walking, not elsewhere classified (R26.2) ?  ? ?Time: 0944-1000 ?PT Time Calculation (min) (ACUTE ONLY): 16 min ? ? ?Charges:   PT Evaluation ?$PT Eval Low Complexity: 1 Low ?  ?  ?   ? ? ? ?Arby Barrette, PT ?Acute Rehabilitation Services  ?Pager [phone] ?Office [phone] ? ? ?Jeanie Cooks Elzora Cullins ?01/04/2022, 10:15 AM ? ?

## 2022-01-05 ENCOUNTER — Inpatient Hospital Stay (HOSPITAL_COMMUNITY): Payer: Medicare Other

## 2022-01-05 ENCOUNTER — Encounter (HOSPITAL_COMMUNITY): Payer: Self-pay | Admitting: Surgery

## 2022-01-05 DIAGNOSIS — K5720 Diverticulitis of large intestine with perforation and abscess without bleeding: Secondary | ICD-10-CM | POA: Diagnosis not present

## 2022-01-05 DIAGNOSIS — N179 Acute kidney failure, unspecified: Secondary | ICD-10-CM | POA: Diagnosis not present

## 2022-01-05 DIAGNOSIS — M869 Osteomyelitis, unspecified: Secondary | ICD-10-CM | POA: Diagnosis not present

## 2022-01-05 DIAGNOSIS — N183 Chronic kidney disease, stage 3 unspecified: Secondary | ICD-10-CM | POA: Diagnosis not present

## 2022-01-05 LAB — TYPE AND SCREEN
ABO/RH(D): O POS
Antibody Screen: NEGATIVE
Unit division: 0
Unit division: 0
Unit division: 0

## 2022-01-05 LAB — COMPREHENSIVE METABOLIC PANEL
ALT: 12 U/L (ref 0–44)
AST: 17 U/L (ref 15–41)
Albumin: 2 g/dL — ABNORMAL LOW (ref 3.5–5.0)
Alkaline Phosphatase: 71 U/L (ref 38–126)
Anion gap: 10 (ref 5–15)
BUN: 27 mg/dL — ABNORMAL HIGH (ref 8–23)
CO2: 25 mmol/L (ref 22–32)
Calcium: 8.9 mg/dL (ref 8.9–10.3)
Chloride: 104 mmol/L (ref 98–111)
Creatinine, Ser: 1.37 mg/dL — ABNORMAL HIGH (ref 0.44–1.00)
GFR, Estimated: 40 mL/min — ABNORMAL LOW (ref >60)
Glucose, Bld: 173 mg/dL — ABNORMAL HIGH (ref 70–99)
Potassium: 4.3 mmol/L (ref 3.5–5.1)
Sodium: 139 mmol/L (ref 135–145)
Total Bilirubin: 0.7 mg/dL (ref 0.3–1.2)
Total Protein: 4.5 g/dL — ABNORMAL LOW (ref 6.5–8.1)

## 2022-01-05 LAB — CBC
HCT: 29 % — ABNORMAL LOW (ref 36.0–46.0)
Hemoglobin: 9.2 g/dL — ABNORMAL LOW (ref 12.0–15.0)
MCH: 29 pg (ref 26.0–34.0)
MCHC: 31.7 g/dL (ref 30.0–36.0)
MCV: 91.5 fL (ref 80.0–100.0)
Platelets: 179 10*3/uL (ref 150–400)
RBC: 3.17 MIL/uL — ABNORMAL LOW (ref 3.87–5.11)
RDW: 17.2 % — ABNORMAL HIGH (ref 11.5–15.5)
WBC: 17.4 10*3/uL — ABNORMAL HIGH (ref 4.0–10.5)
nRBC: 0.2 % (ref 0.0–0.2)

## 2022-01-05 LAB — BPAM RBC
Blood Product Expiration Date: 202303272359
Blood Product Expiration Date: 202304182359
Blood Product Expiration Date: 202304182359
ISSUE DATE / TIME: 202303201733
ISSUE DATE / TIME: 202303211325
ISSUE DATE / TIME: 202303211325
Unit Type and Rh: 5100
Unit Type and Rh: 5100
Unit Type and Rh: 5100

## 2022-01-05 LAB — MAGNESIUM: Magnesium: 2 mg/dL (ref 1.7–2.4)

## 2022-01-05 IMAGING — DX DG KNEE 1-2V PORT*L*
1 series · 4 of 4 positions shown · non-contrast
Comparison: Left knee x-ray [DATE].

CLINICAL DATA: Tibial plateau fracture.

EXAM:
PORTABLE LEFT KNEE - 1-2 VIEW

[Series 1: knee · 0.14mm/px · 4 of 4 slices shown]
[im 1/4]
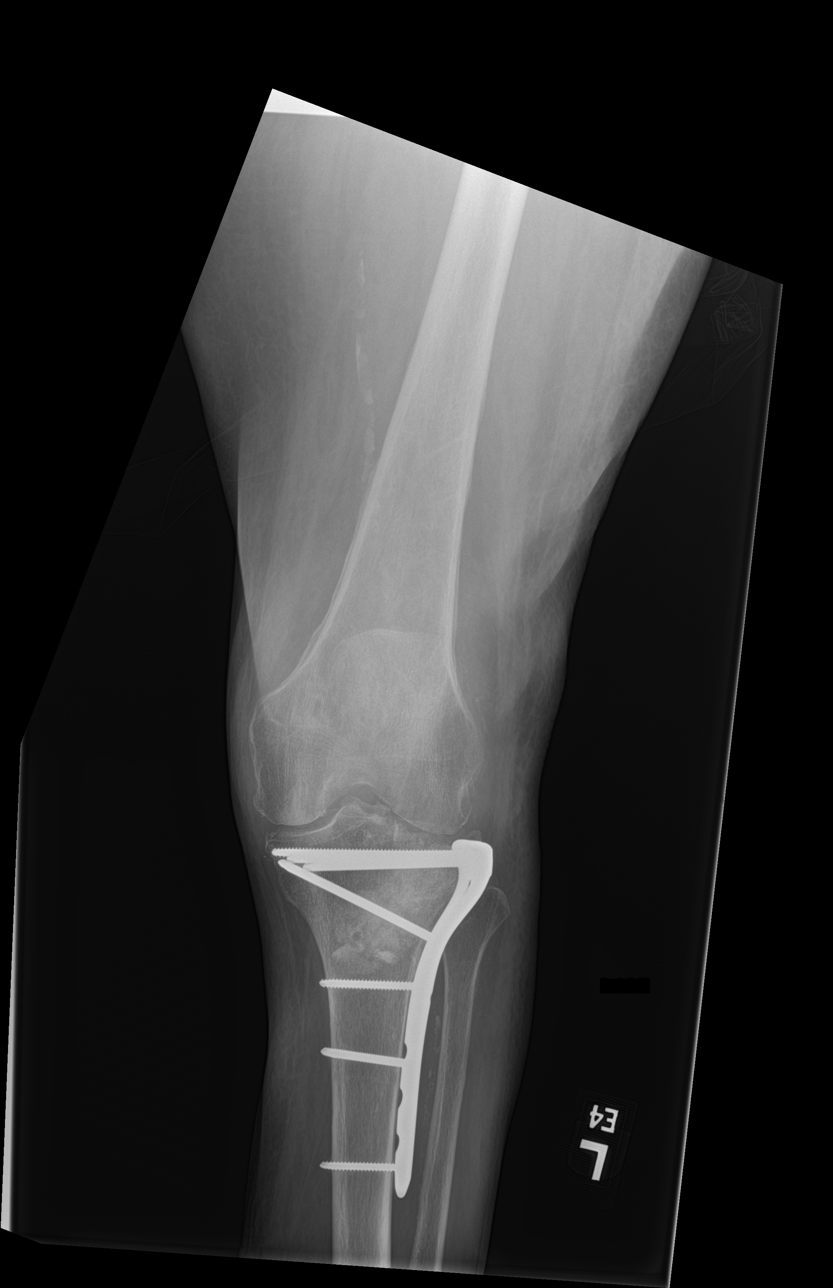
[im 2/4]
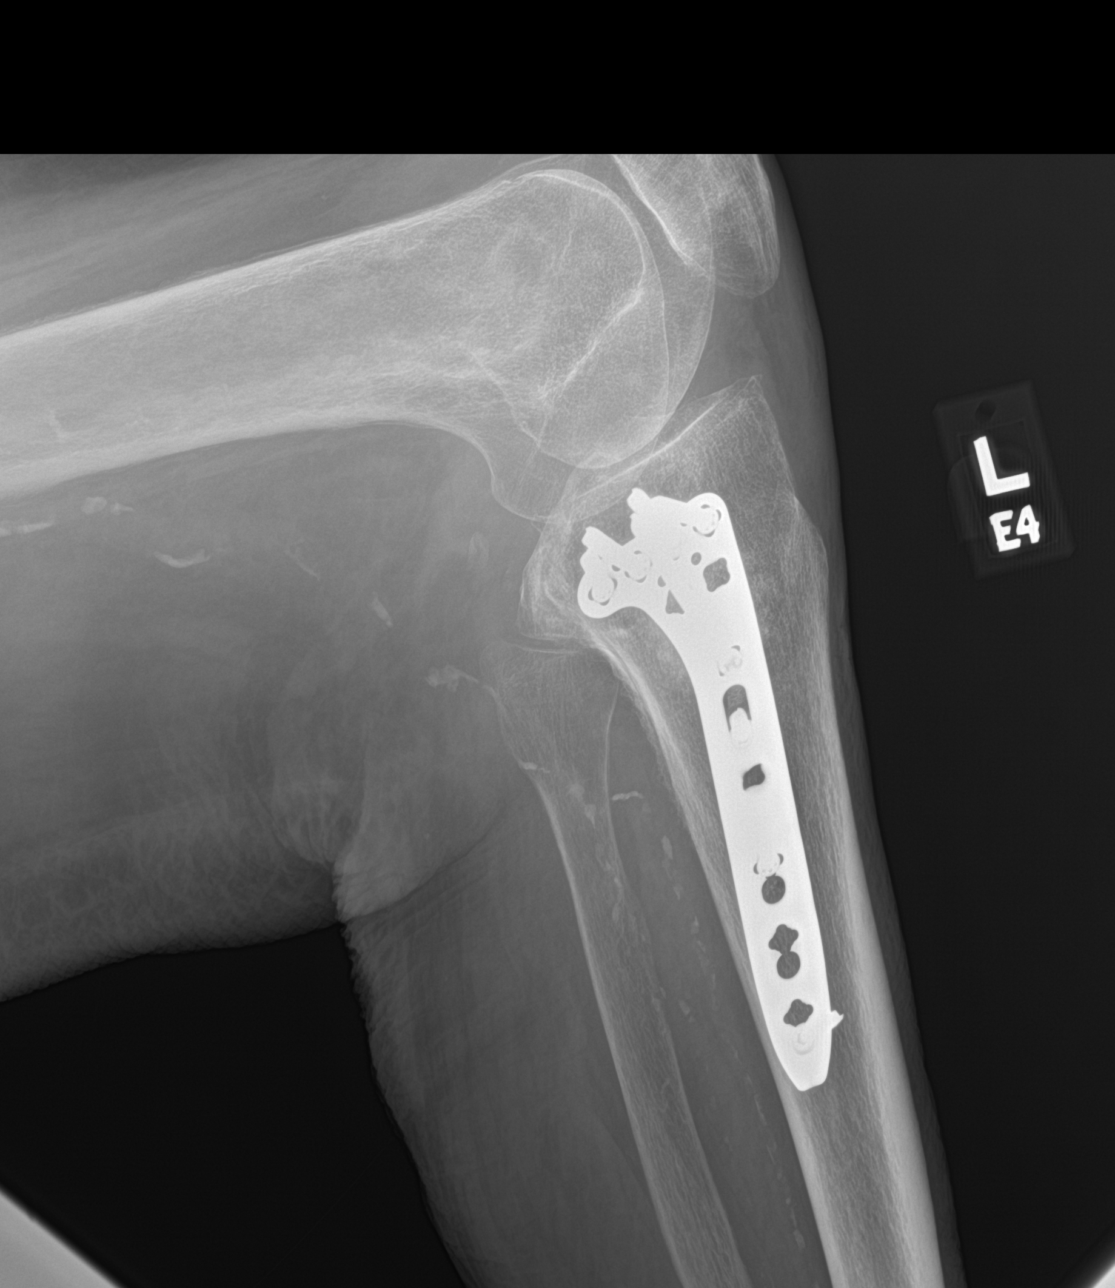
[im 3/4]
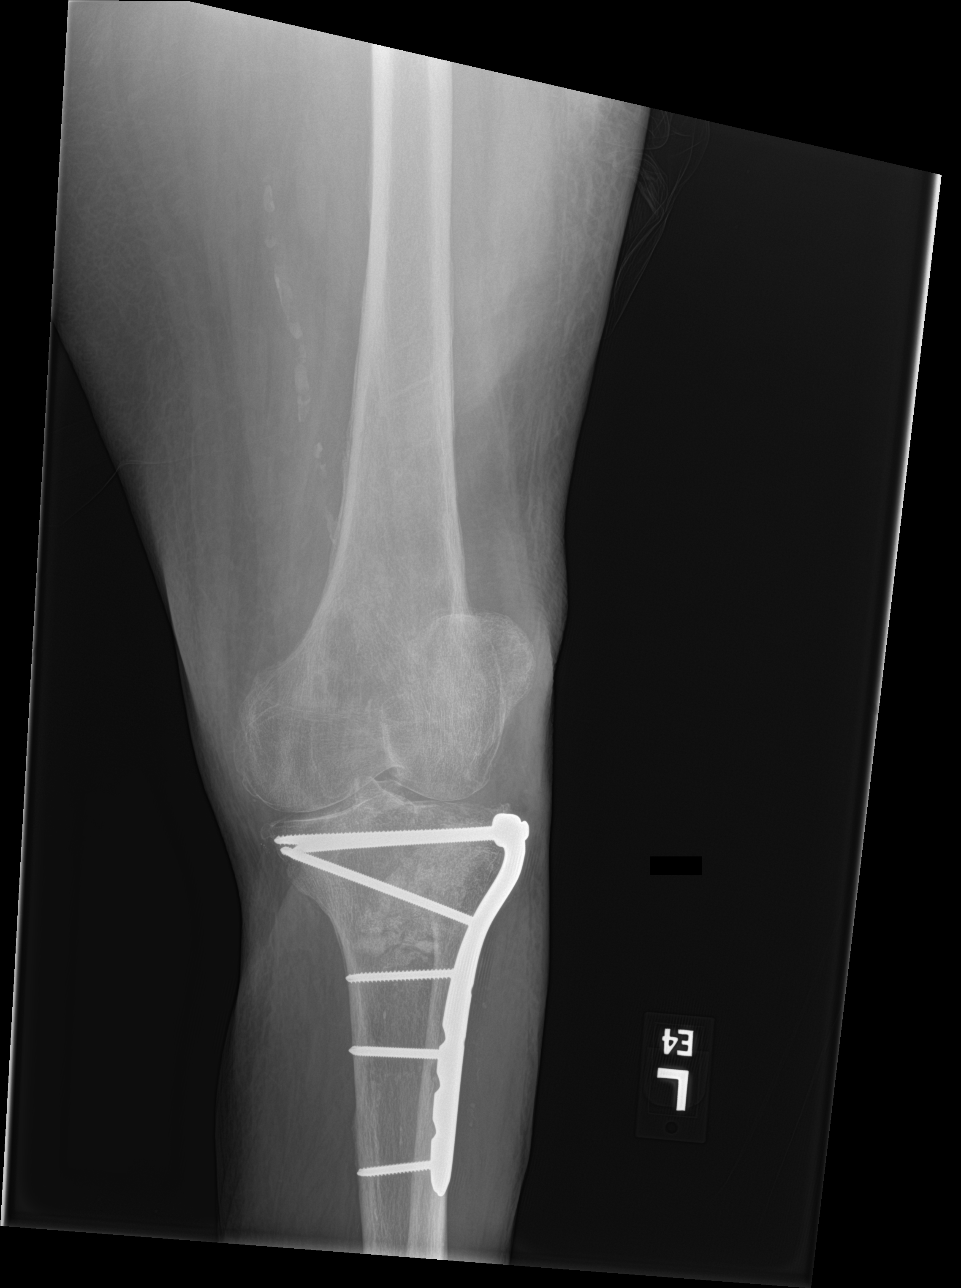
[im 4/4]
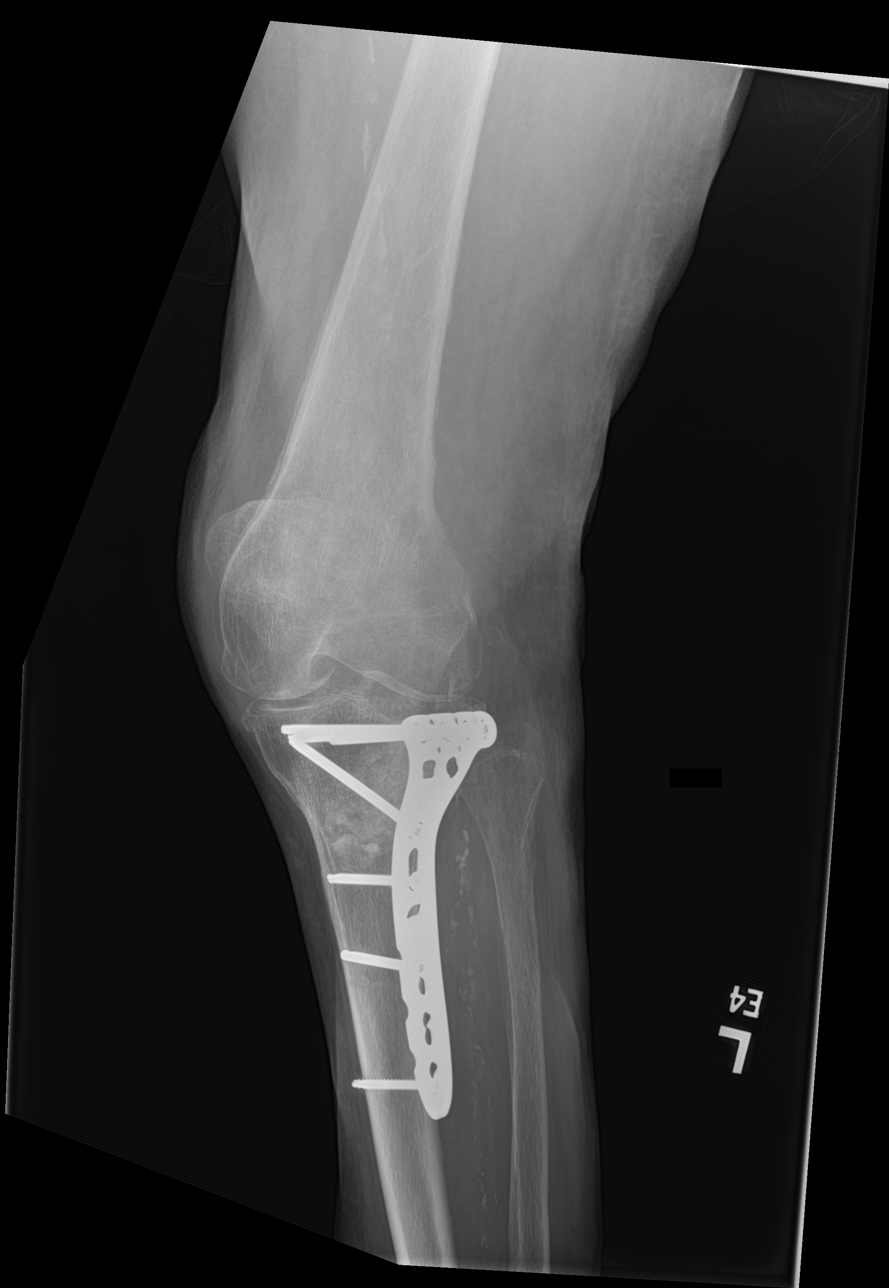

[4 of 4 positions shown; findings below may reference images not displayed]

FINDINGS: The bones are osteopenic. Lateral tibial sideplate and screws are
present fixating a tibial plateau fracture. Alignment is anatomic.
No acute fracture. There is mild medial and lateral compartment
joint space narrowing and osteophyte formation compatible with
degenerative change, similar to the prior study. Anterior soft
tissue swelling and air have resolved in the interval.
IMPRESSION: 1. Left tibial plateau fracture status post ORIF. Alignment is
anatomic. No evidence for complication.

## 2022-01-05 MED ORDER — METOPROLOL SUCCINATE ER 50 MG PO TB24
50.0000 mg | ORAL_TABLET | Freq: Every day | ORAL | Status: DC
  Administered 2022-01-05 – 2022-01-12 (×8): 50 mg via ORAL
  Filled 2022-01-05 (×8): qty 1

## 2022-01-05 NOTE — Progress Notes (Signed)
PHARMACY CONSULT NOTE FOR: ? ?OUTPATIENT  PARENTERAL ANTIBIOTIC THERAPY (OPAT) ? ?Indication: Left knee osteomyelitis ?Regimen: Daptomycin 500 mg IV q24hrs ?End date: 02/09/22 for a total of 6 weeks ? ?IV antibiotic discharge orders are pended. ?To discharging provider:  please sign these orders via discharge navigator,  ?Select New Orders & click on the button choice - Manage This Unsigned Work.  ?  ? ?Thank you for allowing pharmacy to be a part of this patient's care. ? ?Jerilynn Birkenhead, PharmD ?01/05/2022, 11:35 AM ? ?

## 2022-01-05 NOTE — Consult Note (Signed)
[facility] Nurse ostomy consult note ?Stoma type/location: LLQ colostomy ?Patient is up to chair.  Daughter at bedside.  Patient is not up to a teaching session today, but agrees to tomorrow AM.  I discuss basics of ostomy care with daughter, who will be assisting at home.  ?Stomal assessment/size: 2" edematous, pale   ?Peristomal assessment: pouch intact  no stool at this time.  ?Treatment options for stomal/peristomal skin:  will likely implement 1 piece flat pouch with barrier ring.  ?Output none at this time ?Ostomy pouching: 2pc. Will likely implement 1 piece at tomorrow's pouch change.   ?Education provided: Showed stoma to daughter, explained twice weekly pouch changes, emptying when 1/3 full.  Benefits of a filtered pouch (after discharge)  ?Explained outpatient ostomy clinic as a resource for patient and will provide written information to patient and family.  ?Enrolled patient in [place] program: No ?Will follow.  ?Domenic Moras MSN, RN, FNP-BC CWON ?Wound, Ostomy, Continence Nurse ?Pager [phone]  ?

## 2022-01-05 NOTE — Progress Notes (Addendum)
Physical Therapy Treatment ?Patient Details ?Name: Holly [name] ?MRN: [id number] ?DOB: 12/16/1945 ?Today's Date: 01/05/2022 ? ? ?History of Present Illness Pt is a 76 y/o female presenting on 3/18 from [facility] for abdominal pain.  Found with sigmoid diverticulitis with contained perforation and abscess formation. Noted recent L tibial plateau ORIF on 2/27 (osteomyelitis). 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 ? ?  ?PT Comments  ? ? Patient willing to work towards OOB today. Required +2 max assist to pivot to recliner and recommend nursing use lift to return pt to bed after she has finished sitting up in chair (due to pt with poor ability to maintain NWB in standing). Anticipate pt will do better with RW for transfers, however she was slightly impulsive as she wanted to get to the chair and denied need for RW.  ?   ?Recommendations for follow up therapy are one component of a multi-disciplinary discharge planning process, led by the attending physician.  Recommendations may be updated based on patient status, additional functional criteria and insurance authorization. ? ?Follow Up Recommendations ? Home health PT ?  ?  ?Assistance Recommended at Discharge Intermittent Supervision/Assistance  ?Patient can return home with the following A lot of help with walking and/or transfers;Assistance with cooking/housework;Assist for transportation;Help with stairs or ramp for entrance ?  ?Equipment Recommendations ? None recommended by PT  ?  ?Recommendations for Other Services   ? ? ?  ?Precautions / Restrictions Precautions ?Precautions: Fall ?Restrictions ?Weight Bearing Restrictions: Yes ?LLE Weight Bearing: Non weight bearing  ?  ? ?Mobility ? Bed Mobility ?Overal bed mobility: Needs Assistance ?Bed Mobility: Rolling, Sidelying to Sit ?Rolling: Mod assist ?Sidelying to sit: Mod assist, HOB elevated ?  ?  ?  ?General  bed mobility comments: educated in rolling to ease strain on abdomen, then side to sit ?  ? ?Transfers ?Overall transfer level: Needs assistance ?Equipment used: None ?Transfers: Sit to/from Stand, Bed to chair/wheelchair/BSC ?Sit to Stand: Mod assist, +2 physical assistance ?Stand pivot transfers: Max assist, +2 physical assistance ?  ?  ?  ?  ?General transfer comment: both therapists facing pt and on either side to assist her; pt not maintaining NWB on LLE (using it to steady and putting some weight thru it as she step/pivoted RLE) ?  ? ?Ambulation/Gait ?  ?  ?  ?  ?  ?  ?  ?General Gait Details: unable today - NWB ? ? ?Stairs ?  ?  ?  ?  ?  ? ? ?Wheelchair Mobility ?  ? ?Modified Rankin (Stroke Patients Only) ?  ? ? ?  ?Balance Overall balance assessment: Needs assistance ?Sitting-balance support: No upper extremity supported ?Sitting balance-Leahy Scale: Good ?  ?  ?Standing balance support: Bilateral upper extremity supported, Reliant on assistive device for balance ?Standing balance-Leahy Scale: Poor ?Standing balance comment: Steady with RW ?  ?  ?  ?  ?  ?  ?  ?  ?  ?  ?  ?  ? ?  ?Cognition Arousal/Alertness: Awake/alert ?Behavior During Therapy: Anxious ?Overall Cognitive Status: Within Functional Limits for tasks assessed ?  ?  ?  ?  ?  ?  ?  ?  ?  ?  ?  ?  ?  ?  ?  ?  ?General Comments: anxious re: abd pain but willing to participate ?  ?  ? ?  ?  Exercises General Exercises - Lower Extremity ?Ankle Circles/Pumps: AROM, Both, 10 reps ? ?  ?General Comments General comments (skin integrity, edema, etc.): HR max 139; sats >=90% throughout while on 3.5L oxygen ?  ?  ? ?Pertinent Vitals/Pain Pain Assessment ?Pain Assessment: 0-10 ?Pain Score: 7  ?Pain Location: abd ?Pain Descriptors / Indicators: Grimacing, Guarding ?Pain Intervention(s): Limited activity within patient's tolerance, Monitored during session, Premedicated before session, Repositioned  ? ? ?Home Living Family/patient expects to be discharged  to:: Private residence ?Living Arrangements: Children ?Available Help at Discharge: Family;Available PRN/intermittently ?Type of Home: Mobile home ?Home Access: Ramped entrance ?  ?  ?  ?Home Layout: One level ?Home Equipment: BSC/3in1;Rollator (4 wheels);Grab bars - toilet;Grab bars - tub/shower;Shower seat;Wheelchair - manual;Standard Environmental consultant ?Additional Comments: daughter works fulltime, son assists some  ?  ?Prior Function    ?  ?  ?   ? ?PT Goals (current goals can now be found in the care plan section) Acute Rehab PT Goals ?Patient Stated Goal: return home ?Time For Goal Achievement: 01/18/22 ?Potential to Achieve Goals: Good ?Progress towards PT goals: Progressing toward goals ? ?  ?Frequency ? ? ? Min 3X/week ? ? ? ?  ?PT Plan Discharge plan needs to be updated  ? ? ?Co-evaluation PT/OT/SLP Co-Evaluation/Treatment: Yes ?Reason for Co-Treatment: Necessary to address cognition/behavior during functional activity (very anxious and painful) ?PT goals addressed during session: Mobility/safety with mobility;Balance ?  ?  ? ?  ?AM-PAC PT "6 Clicks" Mobility   ?Outcome Measure ? Help needed turning from your back to your side while in a flat bed without using bedrails?: A Lot ?Help needed moving from lying on your back to sitting on the side of a flat bed without using bedrails?: A Lot ?Help needed moving to and from a bed to a chair (including a wheelchair)?: Total ?Help needed standing up from a chair using your arms (e.g., wheelchair or bedside chair)?: Total ?Help needed to walk in hospital room?: Total ?Help needed climbing 3-5 steps with a railing? : Total ?6 Click Score: 8 ? ?  ?End of Session Equipment Utilized During Treatment: Gait belt ?Activity Tolerance: Patient limited by pain;Other (comment) ?Patient left: with call bell/phone within reach;with family/visitor present;in chair;with chair alarm set ?Nurse Communication: Mobility status;Need for lift equipment ?PT Visit Diagnosis: History of falling  (Z91.81);Difficulty in walking, not elsewhere classified (R26.2) ?  ? ? ?Time: 1030-1100 ?PT Time Calculation (min) (ACUTE ONLY): 30 min ? ?Charges:  $Therapeutic Activity: 8-22 mins          ?          ? ? ?Arby Barrette, PT ?Acute Rehabilitation Services  ?Pager [phone] ?Office [phone] ? ? ? ?Jeanie Cooks Adrianna Dudas ?01/05/2022, 11:37 AM ? ?

## 2022-01-05 NOTE — Progress Notes (Signed)
? ? ?1 Day Post-Op  ?Subjective: ?Pain around incisions.  Preoperative pain issues improved but still there.  No ostomy output ? ?ROS: See above, otherwise other systems negative ? ?Objective: ?Vital signs in last 24 hours: ?Temp:  [98.1 ?F (36.7 ?C)-99.3 ?F (37.4 ?C)] 98.3 ?F (36.8 ?C) (03/22 0800) ?Pulse Rate:  [105-136] 108 (03/22 0400) ?Resp:  [17-27] 17 (03/22 0800) ?BP: (121-165)/(53-70) 146/68 (03/22 0800) ?SpO2:  [93 %-100 %] 93 % (03/22 0400) ?Weight:  [62.1 kg] 62.1 kg (03/21 1050) ?Last BM Date : 01/01/22 (pt states she had BM 3 days ago) ? ?Intake/Output from previous day: ?03/21 0701 - 03/22 0700 ?In: 3340.2 [P.O.:120; I.V.:1540.2; Blood:630; IV [id number]] ?Out: 2115 [Urine:665; Drains:250; Blood:1200] ?Intake/Output this shift: ?No intake/output data recorded. ? ?PE: ?Abd: Ostomy healthy with no output.  Midline incision bandaged.  JP drain in right abdomen with minimal drainage ? ?Lab Results:  ?Recent Labs  ?  01/04/22 ?6195 01/04/22 ?1252 01/04/22 ?1425 01/05/22 ?0400  ?WBC 15.4*  --   --  17.4*  ?HGB 9.1*   < > 9.9* 9.2*  ?HCT 31.0*   < > 29.0* 29.0*  ?PLT 246  --   --  179  ? < > = values in this interval not displayed.  ? ? ?BMET ?Recent Labs  ?  01/04/22 ?0932 01/04/22 ?1252 01/04/22 ?1425 01/05/22 ?0400  ?NA 138   < > 139 139  ?K 3.7   < > 4.2 4.3  ?CL 100   < > 99 104  ?CO2 29  --   --  25  ?GLUCOSE 82   < > 150* 173*  ?BUN 18   < > 17 27*  ?CREATININE 0.91   < > 1.00 1.37*  ?CALCIUM 9.6  --   --  8.9  ? < > = values in this interval not displayed.  ? ? ?PT/INR ?Recent Labs  ?  01/03/22 ?0643  ?LABPROT 15.2  ?INR 1.2  ? ? ?CMP  ?   ?Component Value Date/Time  ? NA 139 01/05/2022 0400  ? K 4.3 01/05/2022 0400  ? CL 104 01/05/2022 0400  ? CO2 25 01/05/2022 0400  ? GLUCOSE 173 (H) 01/05/2022 0400  ? BUN 27 (H) 01/05/2022 0400  ? CREATININE 1.37 (H) 01/05/2022 0400  ? CREATININE 0.87 12/09/2021 1050  ? CALCIUM 8.9 01/05/2022 0400  ? PROT 4.5 (L) 01/05/2022 0400  ? ALBUMIN 2.0 (L) 01/05/2022  0400  ? AST 17 01/05/2022 0400  ? AST 12 (L) 12/09/2021 1050  ? ALT 12 01/05/2022 0400  ? ALT 12 12/09/2021 1050  ? ALKPHOS 71 01/05/2022 0400  ? BILITOT 0.7 01/05/2022 0400  ? BILITOT 0.4 12/09/2021 1050  ? GFRNONAA 40 (L) 01/05/2022 0400  ? GFRNONAA >60 12/09/2021 1050  ? GFRAA 58 (L) 05/14/2020 1309  ? ?Lipase  ?No results found for: LIPASE ? ? ? ? ?Studies/Results: ?No results found. ? ?Anti-infectives: ?Anti-infectives (From admission, onward)  ? ? Start     Dose/Rate Route Frequency Ordered Stop  ? 01/01/22 2000  DAPTOmycin (CUBICIN) injection 500 mg  Status:  Discontinued       ? 10 mL Intravenous Daily 01/01/22 1400 01/01/22 1425  ? 01/01/22 1515  DAPTOmycin (CUBICIN) 500 mg in sodium chloride 0.9 % IVPB       ? 8 mg/kg ? 62.1 kg ?120 mL/hr over 30 Minutes Intravenous Daily 01/01/22 1425    ? 01/01/22 1430  cefTRIAXone (ROCEPHIN) 2 g in sodium chloride 0.9 % 100  mL IVPB       ? 2 g ?200 mL/hr over 30 Minutes Intravenous Every 24 hours 01/01/22 1344    ? 01/01/22 1430  metroNIDAZOLE (FLAGYL) IVPB 500 mg       ? 500 mg ?100 mL/hr over 60 Minutes Intravenous Every 12 hours 01/01/22 1344    ? 01/01/22 1415  piperacillin-tazobactam (ZOSYN) IVPB 3.375 g  Status:  Discontinued       ? 3.375 g ?12.5 mL/hr over 240 Minutes Intravenous Every 8 hours 01/01/22 1329 01/01/22 1344  ? 01/01/22 1400  piperacillin-tazobactam (ZOSYN) IVPB 3.375 g  Status:  Discontinued       ? 3.375 g ?100 mL/hr over 30 Minutes Intravenous Every 8 hours 01/01/22 1309 01/01/22 1328  ? ?  ? ? ? ?Assessment/Plan ?Perforated diverticulitis s/p sigmoid colectomy with end colostomy on 01/04/22. ?- WOC consult ?- Monitor JP output ?- Continue penrose drain in midline, remove on day of discharge ?- Increase activity ?- Pain and nausea meds as needed ? ?AKI in the setting of CKD - long case with significant evaporative losses, continue ressucitation and monitor UOP closely ?ABLA on chronic anemia - monitor and transfuse if needed, 1 uPRBC given  intra-op ? ? ? ?FEN - NPO x sips and chips/ IVFs ?VTE - Lovenox ?ID - cubacin, rocephin, flagyl - I would continue ATBX for at least 4 days postoperatively or until WBC normalizes.  Multiple large abscess cavities evacuated intra-op ? 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 ?I reviewed hospitalist notes, last 24 h vitals and pain scores, last 48 h intake and output, last 24 h labs and trends, and last 24 h imaging results. ? ? LOS: 4 days  ? ? ?Felicie Morn, MD ?[facility] ?01/05/2022, 8:44 AM ?Please see Amion for pager number during day hours 7:00am-4:30pm or 7:00am -11:30am on weekends ? ?

## 2022-01-05 NOTE — Progress Notes (Signed)
Occupational Therapy Treatment ?Patient Details ?Name: Holly [name] ?MRN: [id number] ?DOB: December 31, 1945 ?Today's Date: 01/05/2022 ? ? ?History of present illness Pt is a 76 y/o female presenting on 3/18 from [facility] for abdominal pain.  Found with sigmoid diverticulitis with contained perforation and abscess formation. Noted recent L tibial plateau ORIF on 2/27 (osteomyelitis). 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 ?  ?OT comments ? Co-treat session with PT focusing on bed mobility, simulated toilet transfers and toileting as well as bed to recliner transfers.  She was able to complete supine to sit at mod assist level with HOB elevated.  Max assist +2 for transfer stand pivot to the recliner without use of the RW.  Pt was unable to maintain NWBing in the LLE during transfer.  HR elevated into the upper 130s with O2 sats remaining above 92% on 3.5 Ls nasal cannula.  Will continue to follow for progression with ADL tasks.  Current recommendation is for home health but this may be adjusted depending on progress.  ? ?Recommendations for follow up therapy are one component of a multi-disciplinary discharge planning process, led by the attending physician.  Recommendations may be updated based on patient status, additional functional criteria and insurance authorization. ?   ?Follow Up Recommendations ? Home health OT  ?  ?Assistance Recommended at Discharge Frequent or constant Supervision/Assistance  ?Patient can return home with the following ? Two people to help with walking and/or transfers;Two people to help with bathing/dressing/bathroom ?  ?Equipment Recommendations ? Other (comment) (TBD)  ?  ?Recommendations for Other Services   ? ?  ?Precautions / Restrictions Precautions ?Precautions: Fall ?Precaution Comments: colostomy recent left tibia fx ?Restrictions ?Weight Bearing Restrictions: Yes ?LLE Weight  Bearing: Non weight bearing  ? ? ?  ? ?Mobility Bed Mobility ?Overal bed mobility: Needs Assistance ?Bed Mobility: Rolling, Sidelying to Sit ?Rolling: Mod assist ?Sidelying to sit: Mod assist, HOB elevated ?  ?  ?  ?General bed mobility comments: Mod assist was needed for rolling to the left side and for transition from sitting to sidelying with trunk. ?  ? ?Transfers ?  ?  ?  ?  ?  ?  ?  ?  ?  ?  ?  ?  ?Balance Overall balance assessment: Needs assistance ?Sitting-balance support: No upper extremity supported ?Sitting balance-Leahy Scale: Good ?  ?  ?Standing balance support: Bilateral upper extremity supported, Reliant on assistive device for balance ?Standing balance-Leahy Scale: Zero ?Standing balance comment: Pt needed therapist assist for balance in the front and on the left side when transferring stand pivot without the RW. ?  ?  ?  ?  ?  ?  ?  ?  ?  ?  ?  ?   ? ?ADL either performed or assessed with clinical judgement  ? ?ADL Overall ADL's : Needs assistance/impaired ?  ?  ?  ?  ?  ?  ?  ?  ?  ?  ?  ?  ?Toilet Transfer: +2 for physical assistance;BSC/3in1;Stand-pivot;Cueing for sequencing;Maximal assistance ?Toilet Transfer Details (indicate cue type and reason): simulated to the bedside chair ?Toileting- Clothing Manipulation and Hygiene: Sit to/from stand;Maximal assistance;+2 for physical assistance ?  ?  ?  ?  ?General ADL Comments: Pt completed stand pivot transfer to the bed with max +2 assist.  Increased weight noted through the LLE during transfer as well.  Oxygen  sats remained greater than 92% on 3.5Ls nasal cannula with HR elevating from the low 100s at rest up to 139 with activity. ?  ? ?Extremity/Trunk Assessment   ?  ?Lower Extremity Assessment ?RLE Deficits / Details: ROM WFL; hip flexion 2+ (limited by pain); deferred remaining MMT due to abd pain and pt requesting to stop ?RLE: Unable to fully assess due to pain ?LLE Deficits / Details: swelling and brusing left ankle; AROM left knee ~90  flexion; hip flexion 3- (limited by abd pain) ?LLE: Unable to fully assess due to pain ?  ?Cervical / Trunk Assessment ?Cervical / Trunk Assessment: Other exceptions ?Cervical / Trunk Exceptions: overweight ?  ? ?   ?   ?   ? ?Cognition Arousal/Alertness: Awake/alert ?Behavior During Therapy: Anxious ?Overall Cognitive Status: Within Functional Limits for tasks assessed ?  ?  ?  ?  ?  ?  ?  ?  ?  ?  ?  ?  ?  ?  ?  ?  ?  ?  ?  ?   ?   ?   ?General Comments HR max 139; sats >=90% throughout while on 3.5L oxygen  ? ? ?Pertinent Vitals/ Pain         ? ?Home Living Family/patient expects to be discharged to:: Private residence ?Living Arrangements: Children ?Available Help at Discharge: Family;Available PRN/intermittently ?Type of Home: Mobile home ?Home Access: Ramped entrance ?  ?  ?Home Layout: One level ?  ?  ?Bathroom Shower/Tub: Walk-in shower ?  ?Bathroom Toilet: Handicapped height ?Bathroom Accessibility: Yes ?  ?Home Equipment: BSC/3in1;Rollator (4 wheels);Grab bars - toilet;Grab bars - tub/shower;Shower seat;Wheelchair - manual;Standard Environmental consultant ?  ?Additional Comments: daughter works fulltime, son assists some ?  ? ?  ?   ? ?Frequency ? Min 2X/week  ? ? ? ? ?  ?Progress Toward Goals ? ?OT Goals(current goals can now be found in the care plan section) ? Progress towards OT goals: Progressing toward goals ? ?Acute Rehab OT Goals ?Patient Stated Goal: Pt did not state during session. ?OT Goal Formulation: With patient ?Time For Goal Achievement: 01/18/22 ?Potential to Achieve Goals: Good  ?Plan Discharge plan remains appropriate   ? ?Co-evaluation ? ? ? PT/OT/SLP Co-Evaluation/Treatment: Yes ?Reason for Co-Treatment: For patient/therapist safety;To address functional/ADL transfers ?PT goals addressed during session: Mobility/safety with mobility;Balance ?OT goals addressed during session: ADL's and self-care ?  ? ?  ?   ?End of Session Equipment Utilized During Treatment: Oxygen ? ?OT Visit Diagnosis: Pain;Muscle  weakness (generalized) (M62.81) ?Pain - part of body:  (abdomen and left lower leg) ?  ?Activity Tolerance   ?  ?Patient Left with call bell/phone within reach;with family/visitor present;with bed alarm set;in chair ?  ?Nurse Communication Mobility status;Need for lift equipment ?  ? ?   ? ?Time: 1030-1100 ?OT Time Calculation (min): 30 min ? ?Charges: OT General Charges ?$OT Visit: 1 Visit ?OT Treatments ?$Self Care/Home Management : 8-22 mins ? ?Bensyn Bornemann OTR/L ?01/05/2022, 12:16 PM ?

## 2022-01-05 NOTE — Progress Notes (Signed)
?      ?                 PROGRESS NOTE ? ?      ?PATIENT DETAILS ?Name: Holly [name] ?Age: 76 y.o. ?Sex: female ?Date of Birth: 06/17/1946 ?Admit Date: 01/01/2022 ?Admitting Physician Norval Morton, MD ?ACZ:YSAYT, [name], MD ? ?Brief Summary: ?Patient is a 76 y.o.  female with history of HTN, HLD, COPD on 3 L of oxygen at home, lung CA, recent left tibial plateau pathological fracture-found to have MRSA on cultures-on IV daptomycin as outpatient -presented to [place] for abdominal pain-upon further evaluation she was found to have perforated sigmoid diverticulitis with abscess formation.  She was then transferred to [facility] for further evaluation and treatment.  See below for further details. ? ?Significant events: ?2/27>> ORIF/biopsy of left tibial plateau fracture (biopsy negative for malignancy-culture positive for MRSA) ?3/18>> transfer from [facility]-sigmoid diverticulitis with perforation/abscess formation ?3/20>> per IR-no good target for drain placement at this time. 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 ?Significant studies: ?3/17>> CT abdomen (done at [facility]):-diffuse sigmoid diverticulosis with suggestion of acute complicated diverticulitis.  5.4 x 5.6 cm gas/fluid collection in her pelvis.  Inseparable from sigmoid and posterior wall of urinary bladder.  5.6 x 3.5 cm perisigmoid collection ?3/19>> CT abdomen/pelvis: Similar findings in the pelvis compared to prior CT with acute perforated sigmoid diverticulitis. ? ?Significant microbiology data: ?2/27>> culture left proximal tibia: Rare MRSA ? ?Procedures: ?2/27>> ORIF/biopsy of left tibial fracture (Dr. Doreatha Martin) ? ?Consults: ?General surgery, ID, IR ? ?Subjective: ?Reports pain around incision sites, still with preoperative pain ? ?Objective: ?Vitals: ?Blood pressure (!) 146/68, pulse (!) 108, temperature 98.3 ?F (36.8 ?C), temperature source Oral, resp. rate 17, height 5' (1.524 m),  weight 62.1 kg, SpO2 93 %.  ? ?Exam: ? ?Awake Alert, Oriented X 3, No new F.N deficits, Normal affect ?Symmetrical Chest wall movement, Good air movement bilaterally, CTAB ?RRR,No Gallops,Rubs or new Murmurs, No Parasternal Heave ?Midline surgical wound covered with honeycomb mesh, left lower quadrant colostomy bag is empty. ?No Cyanosis, Clubbing or edema, No new Rash or bruise   ? ? ? ?Pertinent Labs/Radiology: ? ?  Latest Ref Rng & Units 01/05/2022  ?  4:00 AM 01/04/2022  ?  2:25 PM 01/04/2022  ?  1:18 PM  ?CBC  ?WBC 4.0 - 10.5 K/uL 17.4      ?Hemoglobin 12.0 - 15.0 g/dL 9.2   9.9   7.8    ?Hematocrit 36.0 - 46.0 % 29.0   29.0   23.0    ?Platelets 150 - 400 K/uL 179      ?  ?Lab Results  ?Component Value Date  ? NA 139 01/05/2022  ? K 4.3 01/05/2022  ? CL 104 01/05/2022  ? CO2 25 01/05/2022  ?  ? ? ?Assessment/Plan: ? ?Perforated sigmoid diverticulitis with pelvic abscess: ?-Failed conservative management, especially no good access for IR to drain abscess.   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 ?-Continue with IV Rocephin and Flagyl, stop date 3/26 ?-WOC consulted ?-Monitor JP output put ? 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  Follows with Dr. Tommy Medal in the outpatient setting.  Appointment scheduled for 4/5 ?-Discussed with Dr. Doreatha Martin, he well evaluate today she had scheduled  follow-up at his clinic tomorrow. ? ?Acute blood loss anemia ?-Has anemia of chronic disease at baseline-worsening anemia is due to acute illness.  No evidence of blood loss.   CBC stable-s/p 1 unit of PRBC on 3/20.  ?-Did require another 1 unit PRBC transfusion intraoperatively. ? ?AKI on CKD stage IIIb: creatinine is up to 1.37 today, continue with IV fluids ? ?Hypothyroidism: Continue Synthroid ? ?GERD: Continue  PPI ? 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  ? ?History of lung cancer: Follow with medical oncology/radiation oncology-currently stable. ? ?OSA: Continue CPAP nightly ? ?Anxiety: Relatively stable-continue BuSpar, Wellbutrin and as needed Xanax. ? ?BMI: ?Estimated body mass index is 26.76 kg/m? as calculated from the following: ?  Height as of this encounter: 5' (1.524 m). ?  Weight as of this encounter: 62.1 kg.  ? ?Code status: ?  Code Status: DNR  ? ?DVT Prophylaxis: ?enoxaparin (LOVENOX) injection 40 mg Start: 01/05/22 1000 ?Place and maintain sequential compression device Start: 01/01/22 1353 ?  ?Family Communication: None at bedside ? ? ?Disposition Plan: ?Status is: Inpatient ?Remains inpatient appropriate because: Pelvic abscess-perforated sigmoid diverticulitis-no response to conservative measures-plans are for exploratory laparotomy with colectomy/colostomy placement. ?  ?Planned Discharge Destination:Home health ? ? ?Diet: ?Diet Order   ? ?       ?  Diet clear liquid Room service appropriate? Yes; Fluid consistency: Thin  Diet effective now       ?  ? ?  ?  ? ?  ?  ? ? ?Antimicrobial agents: ?Anti-infectives (From admission, onward)  ? ? Start     Dose/Rate Route Frequency Ordered Stop  ? 01/01/22 2000  DAPTOmycin (CUBICIN) injection 500 mg  Status:  Discontinued       ? 10 mL Intravenous Daily 01/01/22 1400 01/01/22 1425  ? 01/01/22 1515  DAPTOmycin (CUBICIN) 500 mg in sodium chloride 0.9 % IVPB       ? 8 mg/kg ? 62.1 kg ?120 mL/hr over 30 Minutes Intravenous Daily 01/01/22 1425    ? 01/01/22 1430  cefTRIAXone (ROCEPHIN) 2 g in sodium chloride 0.9 % 100 mL IVPB       ? 2 g ?200 mL/hr over 30 Minutes Intravenous Every 24 hours 01/01/22 1344 01/09/22 2359  ? 01/01/22 1430  metroNIDAZOLE (FLAGYL) IVPB 500 mg       ? 500 mg ?100 mL/hr over 60 Minutes Intravenous Every 12 hours 01/01/22 1344 01/09/22 2359  ? 01/01/22  1415  piperacillin-tazobactam (ZOSYN) IVPB 3.375 g  Status:  Discontinued       ? 3.375 g ?12.5 mL/hr over 240 Minutes Intravenous Every 8 hours 01/01/22 1329 01/01/22 1344  ? 01/01/22 1400  piperacillin-tazobactam (ZOSYN) IVPB 3.375 g  Status:  Discontinued       ? 3.375 g ?100 mL/hr over 30 Minutes Intravenous Every 8 hours 01/01/22 1309 01/01/22 1328  ? ?  ? ? ? ?MEDICATIONS: ?Scheduled Meds: ? sodium chloride   Intravenous Once  ? acetaminophen  1,000 mg Oral Q6H  ? atorvastatin  20 mg Oral QHS  ? buPROPion  300 mg Oral q morning  ? busPIRone  5 mg Oral BID  ? chlorhexidine  15 mL Mouth Rinse BID  ? Chlorhexidine Gluconate Cloth  6 each Topical Daily  ? donepezil  10 mg Oral QHS  ? enoxaparin (LOVENOX) injection  40 mg Subcutaneous Daily  ? fluticasone furoate-vilanterol  1 puff Inhalation Daily  ?  And  ? umeclidinium bromide  1 puff Inhalation Daily  ? levothyroxine  137 mcg Oral Q0600  ? mouth rinse  15 mL Mouth Rinse q12n4p  ? metoprolol succinate  50 mg Oral Daily  ? mirtazapine  30 mg Oral QHS  ? multivitamin with minerals  1 tablet Oral Daily  ? pantoprazole  40 mg Oral BID  ? predniSONE  20 mg Oral Daily  ? sodium chloride flush  3 mL Intravenous Q12H  ? ?Continuous Infusions: ? sodium chloride 100 mL/hr at 01/05/22 0631  ? cefTRIAXone (ROCEPHIN)  IV 2 g (01/03/22 1539)  ? DAPTOmycin (CUBICIN)  IV 500 mg (01/04/22 2309)  ? methocarbamol (ROBAXIN) IV    ? metronidazole 500 mg (01/05/22 0231)  ? ?PRN Meds:.albuterol, ALPRAZolam, HYDROmorphone (DILAUDID) injection, methocarbamol (ROBAXIN) IV, ondansetron (ZOFRAN) IV, ondansetron, oxyCODONE, prochlorperazine, sodium chloride flush ? ? ?I have personally reviewed following labs and imaging studies ? ?LABORATORY DATA: ?CBC: ?Recent Labs  ?Lab 01/01/22 ?1222 01/02/22 ?0400 01/03/22 ?0822 01/04/22 ?0022 01/04/22 ?9147 01/04/22 ?1252 01/04/22 ?1318 01/04/22 ?1425 01/05/22 ?0400  ?WBC 27.7* 22.5* 16.2*  --  15.4*  --   --   --  17.4*  ?NEUTROABS 24.8*  --   --   --    --   --   --   --   --   ?HGB 8.4* 7.3* 6.1*   < > 9.1* 9.5* 7.8* 9.9* 9.2*  ?HCT 28.2* 25.8* 21.9*   < > 31.0* 28.0* 23.0* 29.0* 29.0*  ?MCV 95.3 95.6 97.3  --  93.9  --   --   --  91.5  ?PLT 284 225 19

## 2022-01-05 NOTE — Progress Notes (Signed)
Patient placed self on CPAP.  RT will continue to monitor. ?

## 2022-01-05 NOTE — Progress Notes (Signed)
?   ? ? ? ? ?[facility] ? ?Date of Admission:  01/01/2022    ? ?Lines:  ?3/15-c rue picc single lumen ?  ?Abx: ?3/18-c ceftriaxone/flagyl ?3/15-c daptomycin                                                     ?  ?3/18 ertapenem-->piptazo ?  ?Assessment: ?76 yo female copd, nsclc rml and LUL, left tibial plateau pathological fracture s/p orif 2/27 with biopsy/culture --> infected with mrsa (biopsy no malignant cell), admitted 1/49 for complicated diverticulitis with 5 cm abscess intraabd ?  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 Previously on doxy ?  ?  ?Will continue daptomycin to finish the 6 weeks from start date 3/15 until 4/26. Will need chronic suppression abx likely with doxycycline after that end date ?I agree with Dr Tommy Medal that metastatic cancer in the knee is not ruled out ?  ?  ?#complicated diverticulitis ?She was seen at [facility] for abd pain and found to have 5 cm abscess around ruptured diverticulitis ?IR unable to place drain ?She was started on ertapenem at [facility] but changed to piptazo here ?Worsening pain with conservative management so taken to OR for hartmann procedure on 3/21. Had primary closure of abdominal wall same day ? ?Agree with surgery plan 5 days abx post 3/21 ?  ?  ?Plan: ?Continue daptomycin until 4/26 for left knee osteomyelitis ?Finish ceftriaxone/metronidazole on 3/26 ?She has ID clinic follow up with Dr Tommy Medal on 4/05 at 415 pm ?Discussed with primary team  ?  ? ?Principal Problem: ?  Diverticulitis of intestine with perforation ?Active Problems: ?  Anxiety state ?  COPD exacerbation ([place]) ?  Chronic respiratory failure with hypoxia (HCC) ?  GERD (gastroesophageal reflux disease) ?  DNR (do not resuscitate) ?  Malignant neoplasm of middle lobe of right lung ([place]) ?  Osteomyelitis  ([place]) ?  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  OSA on CPAP ?  Hypothyroidism ? ? ?Allergies  ?Allergen Reactions  ? Amlodipine Besylate Swelling  ?  Edema  ? Codeine Nausea And Vomiting  ? Oxycodone-Acetaminophen Nausea And Vomiting  ? ? ?Scheduled Meds: ? sodium chloride   Intravenous Once  ? acetaminophen  1,000 mg Oral Q6H  ? atorvastatin  20 mg Oral QHS  ? buPROPion  300 mg Oral q morning  ? busPIRone  5 mg Oral BID  ? chlorhexidine  15 mL Mouth Rinse BID  ? Chlorhexidine Gluconate Cloth  6 each Topical Daily  ? donepezil  10 mg Oral QHS  ? enoxaparin (LOVENOX) injection  40 mg Subcutaneous Daily  ? fluticasone furoate-vilanterol  1 puff Inhalation Daily  ? And  ? umeclidinium bromide  1 puff Inhalation Daily  ? levothyroxine  137 mcg Oral Q0600  ? mouth rinse  15 mL Mouth Rinse q12n4p  ? mirtazapine  30 mg Oral QHS  ? multivitamin with minerals  1 tablet Oral Daily  ? pantoprazole  40 mg Oral BID  ? predniSONE  20 mg Oral Daily  ? sodium chloride flush  3 mL Intravenous Q12H  ? ?Continuous Infusions: ? sodium  chloride 100 mL/hr at 01/05/22 0631  ? cefTRIAXone (ROCEPHIN)  IV 2 g (01/03/22 1539)  ? DAPTOmycin (CUBICIN)  IV 500 mg (01/04/22 2309)  ? methocarbamol (ROBAXIN) IV    ? metronidazole 500 mg (01/05/22 0231)  ? ?PRN Meds:.albuterol, ALPRAZolam, HYDROmorphone (DILAUDID) injection, methocarbamol (ROBAXIN) IV, ondansetron (ZOFRAN) IV, ondansetron, oxyCODONE, prochlorperazine, sodium chloride flush ? ? ?SUBJECTIVE: ?S/p hartmann 3/21; some gas in colostomy bag ?Drinking some clear liquid ?Pain better ?No f/c ? ?Reviewed op-note; all abscesses cleaned out; jp drain placed ? ?Review of Systems: ?ROS ?All other ROS was negative, except mentioned above ? ? ? ? ?OBJECTIVE: ?Vitals:  ? 01/04/22 1938 01/05/22 0000 01/05/22 0400 01/05/22 0800  ?BP: 137/70 (!) 150/69 (!) 132/57 (!) 146/68  ?Pulse: (!) 123 (!) 110 (!) 108   ?Resp: (!) 21 18 18 17   ?Temp: 98.7 ?F (37.1 ?C) 98.6 ?F (37 ?C) 98.5 ?F (36.9 ?C) 98.3 ?F  (36.8 ?C)  ?TempSrc: Oral Oral Oral Oral  ?SpO2: 97% 100% 93%   ?Weight:      ?Height:      ? ?Body mass index is 26.76 kg/m?. ? ?Physical Exam ? ?General/constitutional: no distress, pleasant ?HEENT: Normocephalic, PER, Conj Clear, EOMI, Oropharynx clear ?Neck supple ?CV: rrr no mrg ?Lungs: clear to auscultation, normal respiratory effort ?Abd: Soft, Nontender; colostomy functioning with gas and some liquid; the midline incision staples are intact ?Ext: no edema ?Skin: evolving echymosis rue ?Neuro: nonfocal ?MSK: no peripheral joint swelling/tenderness/warmth; back spines nontender ? ? ?Central line presence: RUE picc site no purulence/tenderness ? ?Lab Results ?Lab Results  ?Component Value Date  ? WBC 17.4 (H) 01/05/2022  ? HGB 9.2 (L) 01/05/2022  ? HCT 29.0 (L) 01/05/2022  ? MCV 91.5 01/05/2022  ? PLT 179 01/05/2022  ?  ?Lab Results  ?Component Value Date  ? CREATININE 1.37 (H) 01/05/2022  ? BUN 27 (H) 01/05/2022  ? NA 139 01/05/2022  ? K 4.3 01/05/2022  ? CL 104 01/05/2022  ? CO2 25 01/05/2022  ?  ?Lab Results  ?Component Value Date  ? ALT 12 01/05/2022  ? AST 17 01/05/2022  ? ALKPHOS 71 01/05/2022  ? BILITOT 0.7 01/05/2022  ?  ? ? ?Microbiology: ?No results found for this or any previous visit (from the past 240 hour(s)). ? ? ?Serology: ? ? ?Imaging: ?If present, new imagings (plain films, ct scans, and mri) have been personally visualized and interpreted; radiology reports have been reviewed. Decision making incorporated into the Impression / Recommendations. ? ?3/19 ct abd/pelv ?Stomach/Bowel: The stomach is within normal limits. Similar findings ?in the pelvis consistent with acute perforated sigmoid ?diverticulitis. Previously seen abscess between the sigmoid colon ?and bladder currently measures 5.6 x 5.5 cm (series 3, image 60), ?previously 5.6 x 5.4 cm. This communicates with a more superior ?loculated fluid collection in the central abdomen currently ?measuring 2.7 x 10.3 cm (series 3, image 52),  previously 2.6 x 8.4 ?cm. Additional fluid collection more inferiorly adjacent to the ?rectosigmoid colon currently measures 3.9 x 6.2 cm (series 3, image ?70), previously 3.5 x 6.1 cm. No new fluid collection. No ?obstruction. Normal appendix. ?  ?Vascular/Lymphatic: Aortic atherosclerosis. No enlarged abdominal or ?pelvic lymph nodes. ?  ?Reproductive: Status post hysterectomy. No adnexal masses. ?  ?Other: No pneumoperitoneum. ?  ?Musculoskeletal: No acute or significant osseous findings. ?  ?IMPRESSION: ?1. Similar findings in the pelvis consistent with acute perforated ?sigmoid diverticulitis. Three fluid collections/abscesses in the ?lower abdomen and pelvis are not significantly changed compared  to ?prior study. ?2. Aortic Atherosclerosis  ? ?Jabier Mutton, MD ?[facility] for Infectious Disease ?[place] ?[phone] pager    ?01/05/2022, 11:16 AM ? ?

## 2022-01-06 DIAGNOSIS — J9611 Chronic respiratory failure with hypoxia: Secondary | ICD-10-CM | POA: Diagnosis not present

## 2022-01-06 DIAGNOSIS — J441 Chronic obstructive pulmonary disease with (acute) exacerbation: Secondary | ICD-10-CM | POA: Diagnosis not present

## 2022-01-06 DIAGNOSIS — M869 Osteomyelitis, unspecified: Secondary | ICD-10-CM | POA: Diagnosis not present

## 2022-01-06 DIAGNOSIS — K5720 Diverticulitis of large intestine with perforation and abscess without bleeding: Secondary | ICD-10-CM | POA: Diagnosis not present

## 2022-01-06 LAB — PHOSPHORUS: Phosphorus: 3.2 mg/dL (ref 2.5–4.6)

## 2022-01-06 LAB — CBC
HCT: 26.9 % — ABNORMAL LOW (ref 36.0–46.0)
Hemoglobin: 8.5 g/dL — ABNORMAL LOW (ref 12.0–15.0)
MCH: 29.3 pg (ref 26.0–34.0)
MCHC: 31.6 g/dL (ref 30.0–36.0)
MCV: 92.8 fL (ref 80.0–100.0)
Platelets: 170 10*3/uL (ref 150–400)
RBC: 2.9 MIL/uL — ABNORMAL LOW (ref 3.87–5.11)
RDW: 17 % — ABNORMAL HIGH (ref 11.5–15.5)
WBC: 18.5 10*3/uL — ABNORMAL HIGH (ref 4.0–10.5)
nRBC: 0.1 % (ref 0.0–0.2)

## 2022-01-06 LAB — BASIC METABOLIC PANEL
Anion gap: 3 — ABNORMAL LOW (ref 5–15)
BUN: 23 mg/dL (ref 8–23)
CO2: 30 mmol/L (ref 22–32)
Calcium: 9.2 mg/dL (ref 8.9–10.3)
Chloride: 106 mmol/L (ref 98–111)
Creatinine, Ser: 0.92 mg/dL (ref 0.44–1.00)
GFR, Estimated: >60 mL/min (ref >60)
Glucose, Bld: 112 mg/dL — ABNORMAL HIGH (ref 70–99)
Potassium: 3.9 mmol/L (ref 3.5–5.1)
Sodium: 139 mmol/L (ref 135–145)

## 2022-01-06 LAB — SURGICAL PATHOLOGY

## 2022-01-06 MED ORDER — BOOST / RESOURCE BREEZE PO LIQD CUSTOM
1.0000 | Freq: Three times a day (TID) | ORAL | Status: DC
Start: 2022-01-06 — End: 2022-01-10
  Administered 2022-01-06 – 2022-01-09 (×8): 1 via ORAL

## 2022-01-06 MED ORDER — METHOCARBAMOL 500 MG PO TABS
500.0000 mg | ORAL_TABLET | Freq: Four times a day (QID) | ORAL | Status: DC
  Administered 2022-01-06 – 2022-01-12 (×22): 500 mg via ORAL
  Filled 2022-01-06 (×22): qty 1

## 2022-01-06 NOTE — TOC Initial Note (Signed)
Transition of Care (TOC) - Initial/Assessment Note  ? ? ?Patient Details  ?Name: Holly [name] ?MRN: [id number] ?Date of Birth: 08-25-1946 ? ?Transition of Care (TOC) CM/SW Contact:    ?Cyndi Bender, RN ?Phone Number: ?01/06/2022, 12:21 PM ? ?Clinical Narrative:            ?Spoke to patient's daughter, [name]  regarding transition needs.Therapy recommends HH-PT/OT. Patient with new colostomy and would benefit by having HH-RN. Family learning colostomy care. ?Daughter defers to [facility] to find [facility] agency. ?Cory with [name] accepted HH-PT/OT/RN referral. ? ?Address, Phone number and PCP verified.  ? ?Needs orders for HH-PT/OT/RN ? ? ?Expected Discharge Plan: [place] ?Barriers to Discharge: Continued Medical Work up ? ? ?Patient Goals and CMS Choice ?Patient states their goals for this hospitalization and ongoing recovery are:: return home ?CMS Medicare.gov Compare Post Acute Care list provided to:: Patient ?Choice offered to / list presented to : Patient ? ?Expected Discharge Plan and Services ?Expected Discharge Plan: [place] ?  ?  ?  ?Living arrangements for the past 2 months: [place] ?                ?  ?  ?  ?  ?  ?HH Arranged: PT, OT, Nurse's Aide ?[place] Agency: [place] ?Date HH Agency Contacted: 01/06/22 ?Time [place]: [id number] ?Representative spoke with at [place]: [name] ? ?Prior Living Arrangements/Services ?Living arrangements for the past 2 months: [place] ?Lives with:: Adult Children ?Patient language and need for interpreter reviewed:: Yes ?Do you feel safe going back to the place where you live?: Yes      ?  ?  ?Current home services: DME (walker) ?  ? ?Activities of Daily Living ?Home Assistive Devices/Equipment: Eyeglasses, Oxygen, Walker (specify type), Wheelchair, Shower chair with back ?ADL Screening (condition at time of admission) ?Patient's cognitive ability adequate to safely complete daily activities?: Yes ?Is the  patient deaf or have difficulty hearing?: Yes (hard of hearing) ?Does the patient have difficulty seeing, even when wearing glasses/contacts?: No ?Does the patient have difficulty concentrating, remembering, or making decisions?: No ?Patient able to express need for assistance with ADLs?: Yes ?Does the patient have difficulty dressing or bathing?: Yes ?Independently performs ADLs?: No ?Does the patient have difficulty walking or climbing stairs?: Yes ?Weakness of Legs: Both ?Weakness of Arms/Hands: Both ? ?Permission Sought/Granted ?Permission sought to share information with : Case Manager ?Permission granted to share information with : Yes, Verbal Permission Granted ? Share Information with NAME: [name] ? Permission granted to share info w AGENCY: HH ?   ?   ? ?Emotional Assessment ?  ?  ?  ?  ?  ?  ? ?Admission diagnosis:  Diverticulitis of intestine with perforation [K57.80] ?Patient Active Problem List  ? Diagnosis Date Noted  ? Diverticulitis of intestine with perforation 01/01/2022  ? 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 OSA on CPAP 01/01/2022  ? Hypothyroidism 01/01/2022  ? MRSA infection 12/24/2021  ? Osteomyelitis ([place]) 12/24/2021  ? Hardware complicating wound infection ([place]) 12/24/2021  ? Closed fracture of left tibial plateau 12/13/2021  ? Malignant neoplasm of middle lobe of right lung ([place]) 09/06/2021  ? E coli bacteremia 08/29/2021  ? Sepsis secondary to UTI ([place]) 08/29/2021  ? Acute on chronic respiratory failure with hypoxia and hypercapnia ([place]) 08/28/2021  ? Anemia due to chronic kidney disease 08/28/2021  ? Acute metabolic  encephalopathy 08/28/2021  ? GERD (gastroesophageal reflux disease) 08/28/2021  ? DNR (do not resuscitate) 08/28/2021  ? SIRS (systemic inflammatory response syndrome) ([place]) 08/28/2021  ? Acute respiratory failure with hypoxia and hypercapnia (HCC)   ? Septic shock ([place])   ? Goals of care, counseling/discussion   ? Acute on chronic respiratory failure ([place])  07/23/2021  ? Hypotension 02/23/2021  ? Hypokalemia 02/23/2021  ? Acute renal failure superimposed on stage 3a chronic kidney disease ([place]) 02/22/2021  ? GI bleed 02/16/2021  ? ABLA (acute blood loss anemia) 02/16/2021  ? Diverticulitis 02/16/2021  ? Current chronic use of systemic steroids 02/16/2021  ? Pressure injury of skin 02/16/2021  ? Olecranon bursitis of right elbow 10/02/2020  ? Ulna, olecranon process fracture, right, closed, with routine healing, subsequent encounter 10/01/2020  ? Primary malignant neoplasm of left upper lobe of lung ([place]) 07/09/2019  ? Closed fracture of right olecranon process 12/04/2017  ? Cough 11/11/2015  ? Chronic respiratory failure with hypoxia ([place]) 11/11/2014  ? Cigarette smoker 10/16/2014  ? Multinodular thyroid 07/30/2014  ? 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 COPD exacerbation ([place]) 02/11/2013  ? Pedal edema 02/11/2013  ? COPD GOLD III  06/11/2010  ? 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 HYPERLIPIDEMIA, MIXED 05/13/2010  ? HYPERCALCEMIA 05/13/2010  ? Anxiety state 05/13/2010  ? Essential hypertension 05/13/2010  ? CERVICALGIA 05/13/2010  ? OSTEOPENIA 05/13/2010  ? URINARY INCONTINENCE 05/13/2010  ? 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 ?PCP:  Harlan Stains, MD ?Pharmacy:   ?[place] ?[facility] [place] [address] ?Phone: [phone] Fax: [phone] ? ? ? ? ?Social Determinants of Health (SDOH) Interventions ?  ? ?Readmission Risk Interventions ? ?  08/31/2021  ?  2:17 PM 07/28/2021  ?  1:48 PM 02/18/2021  ?  3:22 PM  ?Readmission Risk Prevention Plan  ?Transportation Screening Complete Complete Complete  ?PCP or Specialist Appt within 5-7 Days   Complete  ?Home Care Screening   Complete  ?Medication Review (RN CM)   Complete  ?Medication Review Press photographer) Complete Referral to Pharmacy   ?PCP or Specialist appointment within 3-5 days of discharge Complete  Complete   ?[place] or Home Care Consult Complete Patient refused   ?SW Recovery Care/Counseling Consult Complete Complete   ?Palliative Care Screening Not Applicable Not Applicable   ?[place] Not Applicable Not Applicable   ? ? ? ?

## 2022-01-06 NOTE — Progress Notes (Signed)
Physical Therapy Treatment ?Patient Details ?Name: Holly [name] ?MRN: [id number] ?DOB: [birth year] ?Today's Date: 01/06/2022 ? ? ?History of Present Illness Pt is a 76 y/o female presenting on 3/18 from [facility] for abdominal pain.  Found with sigmoid diverticulitis with contained perforation and abscess formation. 3/21 SIGMOID COLECTOMY AND COLOSTOMY    PMH includes:  L tibial plateau ORIF on 2/27 (osteomyelitis). ?malignancy; anemia, basal cell carcinoma/lung cancer, COPD on 3L O2, HTN, PNA, CKD. ? ?  ?PT Comments  ? ? Patient now allowed WBAT on LLE for transfers only. Despite this increase in WB status, pt still required +2 max assist to stand-pivot bed to chair. Daughter present and discussed possible need for SNF for rehab prior to return home. Both pt and daughter do not want SNF. Daughter reports that either she or her brother will be with pt 24/7 on discharge. At this time, she would need medical transport home.  ?  ?Recommendations for follow up therapy are one component of a multi-disciplinary discharge planning process, led by the attending physician.  Recommendations may be updated based on patient status, additional functional criteria and insurance authorization. ? ?Follow Up Recommendations ? Home health PT (discussed possible need for SNF and pt/family want to avoid this; hopeful pt can get to 0ne person assist for transfers as they can provide 24/7 with one person; would need medical transport home) ?  ?  ?Assistance Recommended at Discharge Intermittent Supervision/Assistance  ?Patient can return home with the following Assistance with cooking/housework;Assist for transportation;Help with stairs or ramp for entrance;Two people to help with walking and/or transfers ?  ?Equipment Recommendations ? None recommended by PT  ?  ?Recommendations for Other Services   ? ? ?  ?Precautions / Restrictions Precautions ?Precautions: Fall ?Precaution Comments: colostomy; rt quadrant JP drain;  recent left  tibia fx ?Restrictions ?Weight Bearing Restrictions: Yes ?LLE Weight Bearing: Weight bearing as tolerated (for transfers only)  ?  ? ?Mobility ? Bed Mobility ?Overal bed mobility: Needs Assistance ?Bed Mobility: Rolling, Sidelying to Sit ?Rolling: Mod assist ?Sidelying to sit: Mod assist, HOB elevated, +2 for physical assistance (with rail) ?  ?  ?  ?General bed mobility comments: Mod assist was needed for rolling to the left side and for transition from sidelying to sitting with trunk. Assist to scoot out to EOB ?  ? ?Transfers ?Overall transfer level: Needs assistance ?Equipment used: Rolling walker (2 wheels), 2 person hand held assist ?Transfers: Sit to/from Stand, Bed to chair/wheelchair/BSC ?Sit to Stand: Mod assist, +2 physical assistance ?Stand pivot transfers: Max assist, +2 physical assistance ?  ?  ?  ?  ?General transfer comment: stood with RW and pt with knees slowly giving out and unable to advance her feet; RW removed and transferred with one person on either side and pivot to her right ?  ? ?Ambulation/Gait ?  ?  ?  ?  ?  ?  ?  ?General Gait Details: not yet per MD; transfers only ? ? ?Stairs ?  ?  ?  ?  ?  ? ? ?Wheelchair Mobility ?  ? ?Modified Rankin (Stroke Patients Only) ?  ? ? ?  ?Balance Overall balance assessment: Needs assistance ?Sitting-balance support: No upper extremity supported ?Sitting balance-Leahy Scale: Good ?  ?  ?Standing balance support: Bilateral upper extremity supported, Reliant on assistive device for balance ?Standing balance-Leahy Scale: Zero ?Standing balance comment: Pt needed therapist assist for balance in the front and on the left side when transferring stand  pivot without the RW. ?  ?  ?  ?  ?  ?  ?  ?  ?  ?  ?  ?  ? ?  ?Cognition Arousal/Alertness: Awake/alert ?Behavior During Therapy: Anxious ?Overall Cognitive Status: Within Functional Limits for tasks assessed ?  ?  ?  ?  ?  ?  ?  ?  ?  ?  ?  ?  ?  ?  ?  ?  ?General Comments: anxious re: abd pain but willing to  participate ?  ?  ? ?  ?Exercises General Exercises - Lower Extremity ?Ankle Circles/Pumps: AROM, Both, 10 reps ?Quad Sets: AROM, Supine, Both, 10 reps ? ?  ?General Comments General comments (skin integrity, edema, etc.): on 3L [place] O2 throughout ?  ?  ? ?Pertinent Vitals/Pain Pain Assessment ?Pain Assessment: Faces ?Faces Pain Scale: Hurts even more ?Pain Location: abd ?Pain Descriptors / Indicators: Grimacing, Guarding ?Pain Intervention(s): Premedicated before session, Monitored during session  ? ? ?Home Living   ?  ?  ?  ?  ?  ?  ?  ?  ?  ?   ?  ?Prior Function    ?  ?  ?   ? ?PT Goals (current goals can now be found in the care plan section) Acute Rehab PT Goals ?Patient Stated Goal: return home ?Time For Goal Achievement: 01/18/22 ?Potential to Achieve Goals: Good ?Progress towards PT goals: Progressing toward goals ? ?  ?Frequency ? ? ? Min 3X/week ? ? ? ?  ?PT Plan Discharge plan needs to be updated  ? ? ?Co-evaluation   ?  ?  ?  ?  ? ?  ?AM-PAC PT "6 Clicks" Mobility   ?Outcome Measure ? Help needed turning from your back to your side while in a flat bed without using bedrails?: A Lot ?Help needed moving from lying on your back to sitting on the side of a flat bed without using bedrails?: A Lot ?Help needed moving to and from a bed to a chair (including a wheelchair)?: Total ?Help needed standing up from a chair using your arms (e.g., wheelchair or bedside chair)?: Total ?Help needed to walk in hospital room?: Total ?Help needed climbing 3-5 steps with a railing? : Total ?6 Click Score: 8 ? ?  ?End of Session Equipment Utilized During Treatment: Gait belt ?Activity Tolerance: Patient limited by pain;Patient limited by fatigue ?Patient left: with call bell/phone within reach;with family/visitor present;in chair;with chair alarm set;Other (comment) (with lift pad under for return to bed) ?Nurse Communication: Mobility status;Need for lift equipment ?PT Visit Diagnosis: History of falling (Z91.81);Difficulty in  walking, not elsewhere classified (R26.2) ?  ? ? ?Time: 1130-1202 ?PT Time Calculation (min) (ACUTE ONLY): 32 min ? ?Charges:  $Therapeutic Activity: 23-37 mins          ?          ? ? ?Arby Barrette, PT ?Acute Rehabilitation Services  ?Pager [phone] ?Office [phone] ? ? ? ?Holly [name] ?01/06/2022, 12:21 PM ? ?

## 2022-01-06 NOTE — Progress Notes (Signed)
Patient ID: Holly [name], female   DOB: 1945-11-22, 76 y.o.   MRN: [id number] ?[facility] ?Progress Note ? ?2 Days Post-Op  ?Subjective: ?CC-  ?Daughter at bedside. Abdomen sore but overall feeling better from prior to surgery. Pain medication helps but wears off quickly. Some air from colostomy. Tolerating clear liquids. Denies n/v. ? ?Objective: ?Vital signs in last 24 hours: ?Temp:  [97.6 ?F (36.4 ?C)-98.4 ?F (36.9 ?C)] 97.9 ?F (36.6 ?C) (03/23 3151) ?Pulse Rate:  [63-81] 81 (03/23 0807) ?Resp:  [13-16] 16 (03/23 0807) ?BP: (120-145)/(54-71) 137/71 (03/23 7616) ?SpO2:  [98 %-100 %] 98 % (03/23 0807) ?Last BM Date : 01/01/22 (pt states she had BM 3 days ago) ? ?Intake/Output from previous day: ?03/22 0701 - 03/23 0700 ?In: -  ?Out: 073 [Urine:650; Drains:125] ?Intake/Output this shift: ?Total I/O ?In: -  ?Out: 51 [Drains:50] ? ?PE: ?Gen:  Alert, NAD, pleasant ?Card:  RRR ?Pulm:  rate and effort normal on [place] ?Abd: soft, mild distension, mild lower abdominal TTP, midline with cdi honeycomb dressing, JP with serosanguinous fluid, colostomy viable with small amount of air and thin serosanguinous fluid in bag ?GU: foley with clear yellow urine ? ?Lab Results:  ?Recent Labs  ?  01/05/22 ?0400 01/06/22 ?0411  ?WBC 17.4* 18.5*  ?HGB 9.2* 8.5*  ?HCT 29.0* 26.9*  ?PLT 179 170  ? ?BMET ?Recent Labs  ?  01/05/22 ?0400 01/06/22 ?0411  ?NA 139 139  ?K 4.3 3.9  ?CL 104 106  ?CO2 25 30  ?GLUCOSE 173* 112*  ?BUN 27* 23  ?CREATININE 1.37* 0.92  ?CALCIUM 8.9 9.2  ? ?PT/INR ?No results for input(s): LABPROT, INR in the last 72 hours. ?CMP  ?   ?Component Value Date/Time  ? NA 139 01/06/2022 0411  ? K 3.9 01/06/2022 0411  ? CL 106 01/06/2022 0411  ? CO2 30 01/06/2022 0411  ? GLUCOSE 112 (H) 01/06/2022 0411  ? BUN 23 01/06/2022 0411  ? CREATININE 0.92 01/06/2022 0411  ? CREATININE 0.87 12/09/2021 1050  ? CALCIUM 9.2 01/06/2022 0411  ? PROT 4.5 (L) 01/05/2022 0400  ? ALBUMIN 2.0 (L) 01/05/2022 0400  ? AST 17 01/05/2022 0400   ? AST 12 (L) 12/09/2021 1050  ? ALT 12 01/05/2022 0400  ? ALT 12 12/09/2021 1050  ? ALKPHOS 71 01/05/2022 0400  ? BILITOT 0.7 01/05/2022 0400  ? BILITOT 0.4 12/09/2021 1050  ? GFRNONAA >60 01/06/2022 0411  ? GFRNONAA >60 12/09/2021 1050  ? GFRAA 58 (L) 05/14/2020 1309  ? ?Lipase  ?No results found for: LIPASE ? ? ? ? ?Studies/Results: ?DG Knee Left Port ? ?Result Date: 01/05/2022 ?CLINICAL DATA:  Tibial plateau fracture. EXAM: PORTABLE LEFT KNEE - 1-2 VIEW COMPARISON:  Left knee x-ray 12/13/2021. FINDINGS: The bones are osteopenic. Lateral tibial sideplate and screws are present fixating a tibial plateau fracture. Alignment is anatomic. No acute fracture. There is mild medial and lateral compartment joint space narrowing and osteophyte formation compatible with degenerative change, similar to the prior study. Anterior soft tissue swelling and air have resolved in the interval. IMPRESSION: 1. Left tibial plateau fracture status post ORIF. Alignment is anatomic. No evidence for complication. Electronically Signed   By: Ronney Asters M.D.   On: 01/05/2022 16:11   ? ?Anti-infectives: ?Anti-infectives (From admission, onward)  ? ? Start     Dose/Rate Route Frequency Ordered Stop  ? 01/01/22 2000  DAPTOmycin (CUBICIN) injection 500 mg  Status:  Discontinued       ? 10  mL Intravenous Daily 01/01/22 1400 01/01/22 1425  ? 01/01/22 1515  DAPTOmycin (CUBICIN) 500 mg in sodium chloride 0.9 % IVPB       ? 8 mg/kg ? 62.1 kg ?120 mL/hr over 30 Minutes Intravenous Daily 01/01/22 1425    ? 01/01/22 1430  cefTRIAXone (ROCEPHIN) 2 g in sodium chloride 0.9 % 100 mL IVPB       ? 2 g ?200 mL/hr over 30 Minutes Intravenous Every 24 hours 01/01/22 1344 01/09/22 2359  ? 01/01/22 1430  metroNIDAZOLE (FLAGYL) IVPB 500 mg       ? 500 mg ?100 mL/hr over 60 Minutes Intravenous Every 12 hours 01/01/22 1344 01/09/22 2359  ? 01/01/22 1415  piperacillin-tazobactam (ZOSYN) IVPB 3.375 g  Status:  Discontinued       ? 3.375 g ?12.5 mL/hr over 240  Minutes Intravenous Every 8 hours 01/01/22 1329 01/01/22 1344  ? 01/01/22 1400  piperacillin-tazobactam (ZOSYN) IVPB 3.375 g  Status:  Discontinued       ? 3.375 g ?100 mL/hr over 30 Minutes Intravenous Every 8 hours 01/01/22 1309 01/01/22 1328  ? ?  ? ? ? ?Assessment/Plan ?Complicated diverticulitis - failed conservative management ?-POD#2 s/p EXPLORATORY LAPAROTOMY WITH SIGMOID COLECTOMY AND COLOSTOMY: 49000 (CPT?)  ?Mobilization of the splenic flexure 3/21 Dr. Florence Canner ?- surgical path pending ?- continue CLD and await return in bowel function. Add boost breeze ?- continue JP drain and monitor output ?- WOC following for new colostomy ?- continue PT/OT - recommending home health ?- Continue penrose drain in midline, remove on day of discharge ?- add scheduled robaxin to scheduled tylenol for improved pain control ?- WBC slightly up today 18.5 from 17.4, afebrile. Continue abx and monitor ?  ?FEN - IVF, CLD, Boost ?VTE - Lovenox ?ID - cubacin, rocephin, flagyl - recommend at least 4 days postop ?Foley - in place, per primary, Cr improved ok to dc from our standpoint ?  ?Acute on chronic anemia - hgb 8.5 today. 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 ? LOS: 5 days  ? ? ?Wellington Hampshire, PA-C ?[facility] ?01/06/2022, 9:49 AM ?Please see Amion for pager number during day hours 7:00am-4:30pm ? ?

## 2022-01-06 NOTE — Progress Notes (Signed)
?      ?                 PROGRESS NOTE ? ?      ?PATIENT DETAILS ?Name: Holly [name] ?Age: 76 y.o. ?Sex: female ?Date of Birth: [birth year] ?Admit Date: 01/01/2022 ?Admitting Physician Norval Morton, MD ?UVO:[id number], [name], MD ? ?Brief Summary: ? ?Patient is a 76 y.o.  female with history of HTN, HLD, COPD on 3 L of oxygen at home, lung CA, recent left tibial plateau pathological fracture-found to have MRSA on cultures-on IV daptomycin as outpatient -presented to [facility] for abdominal pain-upon further evaluation she was found to have perforated sigmoid diverticulitis with abscess formation.  She was then transferred to [facility] for further evaluation and treatment.  See below for further details. ? ?Significant events: ?2/27>> ORIF/biopsy of left tibial plateau fracture (biopsy negative for malignancy-culture positive for MRSA) ?3/18>> transfer from [facility]-sigmoid diverticulitis with perforation/abscess formation ?3/20>> per IR-no good target for drain placement at this time. 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 ?Significant studies: ?3/17>> CT abdomen (done at [facility]):-diffuse sigmoid diverticulosis with suggestion of acute complicated diverticulitis.  5.4 x 5.6 cm gas/fluid collection in her pelvis.  Inseparable from sigmoid and posterior wall of urinary bladder.  5.6 x 3.5 cm perisigmoid collection ?3/19>> CT abdomen/pelvis: Similar findings in the pelvis compared to prior CT with acute perforated sigmoid diverticulitis. ? ?Significant microbiology data: ?2/27>> culture left proximal tibia: Rare MRSA ? ?Procedures: ?2/27>> ORIF/biopsy of left tibial fracture (Dr. Doreatha Martin) ? ?Consults: ?General surgery, ID, IR, Ortho ? ?Subjective: ? ?Reports postop abdominal pain, pain medicine is helping, rating clear liquid, no nausea, no vomiting. ? ?Objective: ?Vitals: ?Blood pressure 137/71, pulse 81, temperature 97.9 ?F (36.6 ?C), temperature source  Oral, resp. rate 16, height 5' (1.524 m), weight 62.1 kg, SpO2 98 %.  ? ?Exam: ? ?Awake Alert, Oriented X 3, No new F.N deficits, Normal affect ?Symmetrical Chest wall movement, Good air movement bilaterally, CTAB ?RRR,No Gallops,Rubs or new Murmurs, No Parasternal Heave ?+ve B.Sounds, Abd Soft, midline surgical wound with honey, mesh cover, left lower abdomen colostomy with small amount of thin sanguinous fluid in the bag, JP drain in right abdomen. ?No Cyanosis, Clubbing or edema, No new Rash or bruise   ? ? ? ? ?Pertinent Labs/Radiology: ? ?  Latest Ref Rng & Units 01/06/2022  ?  4:11 AM 01/05/2022  ?  4:00 AM 01/04/2022  ?  2:25 PM  ?CBC  ?WBC 4.0 - 10.5 K/uL 18.5   17.4     ?Hemoglobin 12.0 - 15.0 g/dL 8.5   9.2   9.9    ?Hematocrit 36.0 - 46.0 % 26.9   29.0   29.0    ?Platelets 150 - 400 K/uL 170   179     ?  ?Lab Results  ?Component Value Date  ? NA 139 01/06/2022  ? K 3.9 01/06/2022  ? CL 106 01/06/2022  ? CO2 30 01/06/2022  ?  ? ? ?Assessment/Plan: ? ?Perforated sigmoid diverticulitis with pelvic abscess: ?-Failed conservative management, especially no good access for IR to drain abscess.   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 ?-Continue with IV Rocephin and Flagyl, stop date 3/26 ?-WOC consulted ?-Monitor JP output put ?-Management per general surgery, continue with clear liquid diet, continue with IV antibiotics, white blood  cell count up to 18 from 17, continue with as needed pain regimen ? 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  Follows with Dr. Tommy Medal in the outpatient setting.  Appointment scheduled for 4/5 ?-Cruciate follow-up by orthopedic Dr. Doreatha Martin >> weightbearing:  Advance to WB for transfers only  LLE.  No walker ambulation ? ?Acute blood loss anemia ?-Has anemia of chronic  disease at baseline-worsening anemia is due to acute illness.  No evidence of blood loss.   CBC stable-s/p 1 unit of PRBC on 3/20.  ?-Did require another 1 unit PRBC transfusion intraoperatively. ?-Pain is down to 8.5 today, continue to monitor closely and transfuse as needed. ? ?AKI on CKD stage IIIb: creatinine is up to 1.37 today, continue with IV fluids ? ?Hypothyroidism: Continue Synthroid ? ?GERD: Continue PPI ? 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  ? ?History of lung cancer: Follow with medical oncology/radiation oncology-currently stable. ? ?OSA: Continue CPAP nightly ? ?Anxiety: Relatively stable-continue BuSpar, Wellbutrin and as needed Xanax. ? ?BMI: ?Estimated body mass index is 26.76 kg/m? as calculated from the following: ?  Height as of this encounter: 5' (1.524 m). ?  Weight as of this encounter: 62.1 kg.  ? ?Code status: ?  Code Status: DNR  ? ?DVT Prophylaxis: ?enoxaparin (LOVENOX) injection 40 mg Start: 01/05/22 1000 ?Place and maintain sequential compression device Start: 01/01/22 1353 ?  ?Family Communication: None at bedside ? ? ?Disposition Plan: ?Status is: Inpatient ?Remains inpatient appropriate because: Pelvic abscess-perforated sigmoid diverticulitis-no response to conservative measures-plans are for exploratory laparotomy with colectomy/colostomy placement. ?  ?Planned Discharge Destination:Home health ? ? ?Diet: ?Diet Order   ? ?       ?  Diet clear liquid Room service appropriate? Yes; Fluid consistency: Thin  Diet effective now       ?  ? ?  ?  ? ?  ?  ? ? ?Antimicrobial agents: ?Anti-infectives (From admission, onward)  ? ? Start     Dose/Rate Route Frequency Ordered Stop  ? 01/01/22 2000  DAPTOmycin (CUBICIN) injection 500 mg  Status:  Discontinued       ? 10 mL Intravenous Daily 01/01/22 1400 01/01/22 1425  ? 01/01/22 1515  DAPTOmycin (CUBICIN) 500 mg in sodium chloride 0.9 % IVPB       ?  8 mg/kg ? 62.1 kg ?120 mL/hr over 30 Minutes Intravenous Daily 01/01/22 1425    ? 01/01/22 1430  cefTRIAXone (ROCEPHIN) 2 g in sodium chloride 0.9 % 100 mL IVPB       ? 2 g ?200 mL/hr over 30 Minutes Intravenous Every 24 hours 01/01/22 1344 01/09/22 2359  ? 01/01/22 1430  metroNIDAZOLE (FLAGYL) IVPB 500 mg       ? 500 mg ?100 mL/hr over 60 Minutes Intravenous Every 12 hours 01/01/22 1344 01/09/22 2359  ? 01/01/22 1415  piperacillin-tazobactam (ZOSYN) IVPB 3.375 g  Status:  Discontinued       ? 3.375 g ?12.5 mL/hr over 240 Minutes Intravenous Every 8 hours 01/01/22 1329 01/01/22 1344  ? 01/01/22 1400  piperacillin-tazobactam (ZOSYN) IVPB 3.375 g  Status:  Discontinued       ? 3.375 g ?100 mL/hr over 30 Minutes Intravenous Every 8 hours 01/01/22 1309 01/01/22 1328  ? ?  ? ? ? ?MEDICATIONS: ?Scheduled Meds: ? sodium chloride   Intravenous Once  ? acetaminophen  1,000  mg Oral Q6H  ? atorvastatin  20 mg Oral QHS  ? buPROPion  300 mg Oral q morning  ? busPIRone  5 mg Oral BID  ? chlorhexidine  15 mL Mouth Rinse BID  ? Chlorhexidine Gluconate Cloth  6 each Topical Daily  ? donepezil  10 mg Oral QHS  ? enoxaparin (LOVENOX) injection  40 mg Subcutaneous Daily  ? feeding supplement  1 Container Oral TID BM  ? fluticasone furoate-vilanterol  1 puff Inhalation Daily  ? And  ? umeclidinium bromide  1 puff Inhalation Daily  ? levothyroxine  137 mcg Oral Q0600  ? mouth rinse  15 mL Mouth Rinse q12n4p  ? methocarbamol  500 mg Oral Q6H  ? metoprolol succinate  50 mg Oral Daily  ? mirtazapine  30 mg Oral QHS  ? multivitamin with minerals  1 tablet Oral Daily  ? pantoprazole  40 mg Oral BID  ? predniSONE  20 mg Oral Daily  ? sodium chloride flush  3 mL Intravenous Q12H  ? ?Continuous Infusions: ? sodium chloride 100 mL/hr at 01/05/22 0631  ? cefTRIAXone (ROCEPHIN)  IV 2 g (01/05/22 1605)  ? DAPTOmycin (CUBICIN)  IV 500 mg (01/05/22 1932)  ? metronidazole 500 mg (01/06/22 0153)  ? ?PRN Meds:.albuterol, ALPRAZolam, HYDROmorphone  (DILAUDID) injection, ondansetron (ZOFRAN) IV, ondansetron, oxyCODONE, prochlorperazine, sodium chloride flush ? ? ?I have personally reviewed following labs and imaging studies ? ?LABORATORY DATA: ?CBC: ?Recent L

## 2022-01-06 NOTE — Progress Notes (Signed)
Orthopaedic Trauma Progress Note ? ?SUBJECTIVE: Doing well this morning.  In terms of her left knee, reports pain has been well controlled.  Has been working on range of motion on her own at home.  Notes that at this point "her knee is the least of her worries."  Daughter at bedside.  No other complaints.  Asking about starting some home health therapy at discharge ? ?OBJECTIVE:  ?Vitals:  ? 01/06/22 0000 01/06/22 0807  ?BP: (!) 120/54 137/71  ?Pulse: 63 81  ?Resp: 13 16  ?Temp: 98.4 ?F (36.9 ?C) 97.9 ?F (36.6 ?C)  ?SpO2: 98% 98%  ? ? ?General: Sitting up in bed, no acute distress ?Respiratory: No increased work of breathing.  ?Left lower extremity: Well-healing incisions about knee and proximal tibia.  No significant tenderness with palpation.  Minimal to no swelling about the knee.  Tolerates gentle knee motion.  Ankle DF/PF is intact.  Endorses sensation throughout extremity.  Neurovascularly intact. ? ?IMAGING: Repeat imaging 01/05/2022 stable ? ?LABS:  ?Results for orders placed or performed during the hospital encounter of 01/01/22 (from the past 24 hour(s))  ?CBC     Status: Abnormal  ? Collection Time: 01/06/22  4:11 AM  ?Result Value Ref Range  ? WBC 18.5 (H) 4.0 - 10.5 K/uL  ? RBC 2.90 (L) 3.87 - 5.11 MIL/uL  ? Hemoglobin 8.5 (L) 12.0 - 15.0 g/dL  ? HCT 26.9 (L) 36.0 - 46.0 %  ? MCV 92.8 80.0 - 100.0 fL  ? MCH 29.3 26.0 - 34.0 pg  ? MCHC 31.6 30.0 - 36.0 g/dL  ? RDW 17.0 (H) 11.5 - 15.5 %  ? Platelets 170 150 - 400 K/uL  ? nRBC 0.1 0.0 - 0.2 %  ?Basic metabolic panel     Status: Abnormal  ? Collection Time: 01/06/22  4:11 AM  ?Result Value Ref Range  ? Sodium 139 135 - 145 mmol/L  ? Potassium 3.9 3.5 - 5.1 mmol/L  ? Chloride 106 98 - 111 mmol/L  ? CO2 30 22 - 32 mmol/L  ? Glucose, Bld 112 (H) 70 - 99 mg/dL  ? BUN 23 8 - 23 mg/dL  ? Creatinine, Ser 0.92 0.44 - 1.00 mg/dL  ? Calcium 9.2 8.9 - 10.3 mg/dL  ? GFR, Estimated >60 >60 mL/min  ? Anion gap 3 (L) 5 - 15  ?Phosphorus     Status: None  ? Collection  Time: 01/06/22  4:11 AM  ?Result Value Ref Range  ? Phosphorus 3.2 2.5 - 4.6 mg/dL  ? ? ?ASSESSMENT: Holly [name] is a 76 y.o. female, s/p ORIF of left tibial plateau fracture by Dr. Doreatha Martin on 12/13/2021 ? ?CV/Blood loss: Hemoglobin 8.5 this morning ? ?PLAN: ?Weightbearing:  Advance to WB for transfers only  LLE.  No walker ambulation ?ROM: Okay for unrestricted knee motion as tolerated ?Incisional and dressing care: Okay to leave open to air ?Orthopedic device(s): None  ?Pain management: Continue current regimen ?VTE prophylaxis: Lovenox, SCDs ?ID: Rocephin and daptomycin  ?Impediments to Fracture Healing: Infection, actively being managed by ID.  Vitamin D level 12, will continue on supplementation ?Dispo: Continue care per primary team. Therapies as tolerated.  Will order home health therapies to start at discharge for mobility. ? ?Follow - up plan:  3 weeks with Dr. Doreatha Martin ? ? ?Contact information:  Katha Hamming MD, Rushie Nyhan PA-C. After hours and holidays please check [email or web address] for group call information for [facility] ? ? ?Gwinda Passe, PA-C ?[phone]  [phone] (office) ?[email or web address] ? ? ? ?

## 2022-01-06 NOTE — Consult Note (Signed)
[place] Nurse ostomy follow up ?Patient receiving care in [facility] [phone] ?Daughter present for ostomy teaching and education.  ?Stoma type/location: LLQ colostomy ?Stomal assessment/size: 1 3/4" pale pink ?Peristomal assessment: intact ?Treatment options for stomal/peristomal skin: barrier ring, no sting skin barrier wipes ?Output: Thin serosanguinous fluid ?Ostomy pouching: 2pc. 2 3/4" may consider changing to 1 piece if the stoma is determined to be in a crease once her mild distention is relieved ?Education provided:  ?Daughter did most of the pouch change today following demonstration. ?Explained role of ostomy nurse and creation of stoma  ?Explained stoma characteristics (budded, flush, color, texture, care) ?Demonstrated pouch change (cutting new skin barrier, measuring stoma, cleaning peristomal skin and stoma, use of barrier ring) ?Education on emptying when 1/3 to 1/2 full and how to empty ?Demonstrated "burping" flatus from pouch ?Demonstrated use of wick to clean spout  ?Discussed bathing, diet, gas, medication use, constipation ?Handout given "Eating with an Ostomy" ([employer] [place] www.ostomy.org (2022)    ?Discussed treatment of peristomal skin (ostomy powder, skin barrier wipes) ?Answered patient/family questions:   ?Enrolled patient in [place] Start Discharge program: Yes ? ?Welcome to [employer]! ?Thank you for choosing to enroll. A member of our team will be in touch shortly to explain our services, which include: ?Personalized support from a Contractor ?Product, supplier, and insurance information ?Condition-specific information and connection to community resources ?If you have questions now, or in the future, please call us at [phone]. ? ?I recommend/discussed the [facility] outpatient ostomy clinic as an outpatient resource.  IF MD agrees this would be beneficial, please fax referral, or enter electronically in Epic the referral.  (Fax- [phone])  ? ?WOC will follow with continued education and teaching. ? ?Cathlean Marseilles Tamala Julian, MSN, RN, CMSRN, AGCNS, WTA ?Wound Treatment Associate ?Pager [phone]  ? ?

## 2022-01-07 DIAGNOSIS — J9611 Chronic respiratory failure with hypoxia: Secondary | ICD-10-CM | POA: Diagnosis not present

## 2022-01-07 DIAGNOSIS — L8990 Pressure ulcer of unspecified site, unspecified stage: Secondary | ICD-10-CM | POA: Diagnosis not present

## 2022-01-07 DIAGNOSIS — K5720 Diverticulitis of large intestine with perforation and abscess without bleeding: Secondary | ICD-10-CM | POA: Diagnosis not present

## 2022-01-07 DIAGNOSIS — M869 Osteomyelitis, unspecified: Secondary | ICD-10-CM | POA: Diagnosis not present

## 2022-01-07 LAB — BASIC METABOLIC PANEL
Anion gap: 6 (ref 5–15)
BUN: 17 mg/dL (ref 8–23)
CO2: 30 mmol/L (ref 22–32)
Calcium: 9.1 mg/dL (ref 8.9–10.3)
Chloride: 104 mmol/L (ref 98–111)
Creatinine, Ser: 0.78 mg/dL (ref 0.44–1.00)
GFR, Estimated: >60 mL/min (ref >60)
Glucose, Bld: 82 mg/dL (ref 70–99)
Potassium: 3.8 mmol/L (ref 3.5–5.1)
Sodium: 140 mmol/L (ref 135–145)

## 2022-01-07 LAB — CBC
HCT: 28.3 % — ABNORMAL LOW (ref 36.0–46.0)
Hemoglobin: 8.5 g/dL — ABNORMAL LOW (ref 12.0–15.0)
MCH: 28.4 pg (ref 26.0–34.0)
MCHC: 30 g/dL (ref 30.0–36.0)
MCV: 94.6 fL (ref 80.0–100.0)
Platelets: 199 10*3/uL (ref 150–400)
RBC: 2.99 MIL/uL — ABNORMAL LOW (ref 3.87–5.11)
RDW: 17 % — ABNORMAL HIGH (ref 11.5–15.5)
WBC: 17.7 10*3/uL — ABNORMAL HIGH (ref 4.0–10.5)
nRBC: 0.1 % (ref 0.0–0.2)

## 2022-01-07 LAB — PHOSPHORUS: Phosphorus: 2.6 mg/dL (ref 2.5–4.6)

## 2022-01-07 NOTE — Progress Notes (Signed)
Patient ID: Holly [name], female   DOB: 1946-09-27, 76 y.o.   MRN: [id number] ?[facility] ?Progress Note ? ?3 Days Post-Op  ?Subjective: ?Still having abdominal pain.  No nausea/vomiting. ? ?Objective: ?Vital signs in last 24 hours: ?Temp:  [97.1 ?F (36.2 ?C)-98.3 ?F (36.8 ?C)] 97.6 ?F (36.4 ?C) (03/24 1856) ?Pulse Rate:  [61-88] 76 (03/24 0748) ?Resp:  [15-23] 20 (03/24 0748) ?BP: (128-152)/(55-69) 133/55 (03/24 0748) ?SpO2:  [92 %-98 %] 96 % (03/24 0748) ?Last BM Date : 01/01/22 (pt states she had BM 3 days ago) ? ?Intake/Output from previous day: ?03/23 0701 - 03/24 0700 ?In: 107.5 [IV Piggyback:107.5] ?Out: 410 [Urine:200; Drains:210] ?Intake/Output this shift: ?Total I/O ?In: -  ?Out: 300 [Urine:300] ? ?PE: ?Gen:  Alert, NAD, pleasant ?Card:  RRR ?Pulm:  rate and effort normal on [place] ?Abd: soft, mild distension, mild lower abdominal TTP, midline with cdi honeycomb dressing, JP with serosanguinous fluid, colostomy viable with small amount of air and thin serosanguinous fluid in bag ?GU: foley with clear yellow urine ? ?Lab Results:  ?Recent Labs  ?  01/06/22 ?0411 01/07/22 ?0421  ?WBC 18.5* 17.7*  ?HGB 8.5* 8.5*  ?HCT 26.9* 28.3*  ?PLT 170 199  ? ? ?BMET ?Recent Labs  ?  01/06/22 ?0411 01/07/22 ?0421  ?NA 139 140  ?K 3.9 3.8  ?CL 106 104  ?CO2 30 30  ?GLUCOSE 112* 82  ?BUN 23 17  ?CREATININE 0.92 0.78  ?CALCIUM 9.2 9.1  ? ? ?PT/INR ?No results for input(s): LABPROT, INR in the last 72 hours. ?CMP  ?   ?Component Value Date/Time  ? NA 140 01/07/2022 0421  ? K 3.8 01/07/2022 0421  ? CL 104 01/07/2022 0421  ? CO2 30 01/07/2022 0421  ? GLUCOSE 82 01/07/2022 0421  ? BUN 17 01/07/2022 0421  ? CREATININE 0.78 01/07/2022 0421  ? CREATININE 0.87 12/09/2021 1050  ? CALCIUM 9.1 01/07/2022 0421  ? PROT 4.5 (L) 01/05/2022 0400  ? ALBUMIN 2.0 (L) 01/05/2022 0400  ? AST 17 01/05/2022 0400  ? AST 12 (L) 12/09/2021 1050  ? ALT 12 01/05/2022 0400  ? ALT 12 12/09/2021 1050  ? ALKPHOS 71 01/05/2022 0400  ? BILITOT 0.7  01/05/2022 0400  ? BILITOT 0.4 12/09/2021 1050  ? GFRNONAA >60 01/07/2022 0421  ? GFRNONAA >60 12/09/2021 1050  ? GFRAA 58 (L) 05/14/2020 1309  ? ?Lipase  ?No results found for: LIPASE ? ? ? ? ?Studies/Results: ?DG Knee Left Port ? ?Result Date: 01/05/2022 ?CLINICAL DATA:  Tibial plateau fracture. EXAM: PORTABLE LEFT KNEE - 1-2 VIEW COMPARISON:  Left knee x-ray 12/13/2021. FINDINGS: The bones are osteopenic. Lateral tibial sideplate and screws are present fixating a tibial plateau fracture. Alignment is anatomic. No acute fracture. There is mild medial and lateral compartment joint space narrowing and osteophyte formation compatible with degenerative change, similar to the prior study. Anterior soft tissue swelling and air have resolved in the interval. IMPRESSION: 1. Left tibial plateau fracture status post ORIF. Alignment is anatomic. No evidence for complication. Electronically Signed   By: Ronney Asters M.D.   On: 01/05/2022 16:11   ? ?Anti-infectives: ?Anti-infectives (From admission, onward)  ? ? Start     Dose/Rate Route Frequency Ordered Stop  ? 01/01/22 2000  DAPTOmycin (CUBICIN) injection 500 mg  Status:  Discontinued       ? 10 mL Intravenous Daily 01/01/22 1400 01/01/22 1425  ? 01/01/22 1515  DAPTOmycin (CUBICIN) 500 mg in sodium chloride 0.9 % IVPB       ?  8 mg/kg ? 62.1 kg ?120 mL/hr over 30 Minutes Intravenous Daily 01/01/22 1425    ? 01/01/22 1430  cefTRIAXone (ROCEPHIN) 2 g in sodium chloride 0.9 % 100 mL IVPB       ? 2 g ?200 mL/hr over 30 Minutes Intravenous Every 24 hours 01/01/22 1344 01/09/22 2359  ? 01/01/22 1430  metroNIDAZOLE (FLAGYL) IVPB 500 mg       ? 500 mg ?100 mL/hr over 60 Minutes Intravenous Every 12 hours 01/01/22 1344 01/09/22 2359  ? 01/01/22 1415  piperacillin-tazobactam (ZOSYN) IVPB 3.375 g  Status:  Discontinued       ? 3.375 g ?12.5 mL/hr over 240 Minutes Intravenous Every 8 hours 01/01/22 1329 01/01/22 1344  ? 01/01/22 1400  piperacillin-tazobactam (ZOSYN) IVPB 3.375 g   Status:  Discontinued       ? 3.375 g ?100 mL/hr over 30 Minutes Intravenous Every 8 hours 01/01/22 1309 01/01/22 1328  ? ?  ? ? ? ?Assessment/Plan ?Holly [name] is a 77 year old female with complicated diverticulitis with multiple abscesses not amenable to IR drainage who failed antibiotic treatment requiring exploratory laparotomy with sigmoid colectomy and colostomy on 01/04/22 with Dr. Thermon Leyland. ? ?- surgical path benign - perforated diverticulitis ?- continue CLD and await return in bowel function. Add boost breeze ?- continue JP drain and monitor output (sitting in pelvis in location of abscesses) ?- WOC following for new colostomy ?- continue PT/OT - recommending home health ?- Continue penrose drain in midline, remove on day of discharge ?- add scheduled robaxin to scheduled tylenol for improved pain control ?- WBC still elevated, may eventually need CT if she has a prolonged postoperative ileus or the WBC fails to improve. Continue abx and monitor ?  ?FEN - IVF, CLD, Boost ?VTE - Lovenox ?ID - cubacin, rocephin, flagyl - recommend at least 4 days postop, or until WBC improves ?Foley - removed ?  ?Acute on chronic anemia - hgb 8.5 today stable. 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 ? LOS: 6 days  ? ? ?Felicie Morn, MD ? ?[facility] ?01/07/2022, 8:37 AM ?Please see Amion for pager number during day hours 7:00am-4:30pm ? ?

## 2022-01-07 NOTE — Progress Notes (Signed)
Occupational Therapy Treatment ?Patient Details ?Name: Holly [name] ?MRN: [id number] ?DOB: Sep 05, 1946 ?Today's Date: 01/07/2022 ? ? ?History of present illness Pt is a 76 y/o female presenting on 3/18 from [facility] for abdominal pain.  Found with sigmoid diverticulitis with contained perforation and abscess formation. 3/21 SIGMOID COLECTOMY AND COLOSTOMY    PMH includes:  L tibial plateau ORIF on 2/27 (osteomyelitis). ?malignancy; anemia, basal cell carcinoma/lung cancer, COPD on 3L O2, HTN, PNA, CKD. ?  ?OT comments ? Pt is making steady progress with OT, still requiring mod +2 but much improved with transfers to the [facility] and the bedside chair with and without the RW today.  Oxygen sats remained greater than 89% on 4Ls with HR increasing to 127 during standing and transfers.  Will still follow in acute care.  Note pt has declined SNF rehab, so will continue with recommendation for [place].    ? ?Recommendations for follow up therapy are one component of a multi-disciplinary discharge planning process, led by the attending physician.  Recommendations may be updated based on patient status, additional functional criteria and insurance authorization. ?   ?Follow Up Recommendations ? Home health OT  ?  ?Assistance Recommended at Discharge Frequent or constant Supervision/Assistance  ?Patient can return home with the following ? A lot of help with walking and/or transfers;A lot of help with bathing/dressing/bathroom;Assistance with cooking/housework;Direct supervision/assist for financial management ?  ?Equipment Recommendations ? BSC/3in1  ?  ?   ?Precautions / Restrictions Precautions ?Precautions: Fall ?Precaution Comments: colostomy; rt quadrant JP drain;  recent left tibia fx ?Restrictions ?Weight Bearing Restrictions: Yes ?LLE Weight Bearing: Weight bearing as tolerated ?Other Position/Activity Restrictions: WBAT for transfers only  ? ? ?  ? ?Mobility Bed Mobility ?Overal bed mobility: Needs Assistance ?Bed  Mobility: Rolling ?Rolling: Mod assist ?Sidelying to sit: Mod assist, HOB elevated ?  ?  ?  ?General bed mobility comments: Mod instructional cueing for sequencing transfer including flexing LEs, rolling, and transiton to sitting.  Min to mod physical assist for completing all aspects. ?  ? ?Transfers ?Overall transfer level: Needs assistance ?Equipment used: Rolling walker (2 wheels) (one transfer completed stand pivot without device to the [facility]) ?Transfers: Sit to/from Stand, Bed to chair/wheelchair/BSC ?Sit to Stand: Mod assist, +2 physical assistance, +2 safety/equipment ?Stand pivot transfers: Mod assist, +2 safety/equipment, +2 physical assistance ?  ?  ?  ?  ?General transfer comment: Pt completed sit to stand from the [facility] with use of the RW and mod instructional cueing for hand placement during transition.  She was able to take a few small steps to turn and align herself with the bedside recliner, but sat abruptly secondary to fatigue and BLE weakness. ?  ?  ?Balance Overall balance assessment: Needs assistance ?Sitting-balance support: Bilateral upper extremity supported ?Sitting balance-Leahy Scale: Good ?  ?  ?Standing balance support: Bilateral upper extremity supported, Reliant on assistive device for balance ?Standing balance-Leahy Scale: Zero ?Standing balance comment: Therapist assist as well as assistive device when standing. ?  ?  ?  ?  ?  ?  ?  ?  ?  ?  ?  ?   ? ?ADL either performed or assessed with clinical judgement  ? ?ADL Overall ADL's : Needs assistance/impaired ?  ?  ?Grooming: Wash/dry hands;Wash/dry face;Set up;Sitting ?Grooming Details (indicate cue type and reason): supported in recliner ?  ?  ?  ?  ?  ?  ?  ?  ?Toilet Transfer: Moderate assistance;+2 for safety/equipment ?Toilet  Transfer Details (indicate cue type and reason): stand pivot to [facility] without assistive device ?Toileting- Clothing Manipulation and Hygiene: Minimal assistance ?Toileting - Clothing Manipulation Details (indicate  cue type and reason): sit to squat ?  ?  ?Functional mobility during ADLs: Minimal assistance;Rolling walker (2 wheels) ?General ADL Comments: Mod assist was needed for transfer from supine to sit with max encouragement to complete transfer to the 3:1 initially.  Pt completed stand pivot to the recliner from the BSC 180 degrees with mod assist +2 using the RW. ?  ? ? ?   ?   ?   ? ?Cognition Arousal/Alertness: Awake/alert ?Behavior During Therapy: [facility] for tasks assessed/performed ?Overall Cognitive Status: Within Functional Limits for tasks assessed ?  ?  ?  ?  ?  ?  ?  ?  ?  ?  ?  ?  ?  ?  ?  ?  ?  ?  ?  ?   ?   ?   ?   ? ? ?Pertinent Vitals/ Pain       Pain Assessment ?Pain Assessment: Faces ?Faces Pain Scale: Hurts little more ?Pain Location: abd ?Pain Descriptors / Indicators: Discomfort ?Pain Intervention(s): Limited activity within patient's tolerance, Monitored during session, Repositioned ? ?   ?   ? ?Frequency ? Min 2X/week  ? ? ? ? ?  ?Progress Toward Goals ? ?OT Goals(current goals can now be found in the care plan section) ? Progress towards OT goals: Progressing toward goals ? ?Acute Rehab OT Goals ?Patient Stated Goal: Pt did not state during this session. ?OT Goal Formulation: With patient ?Time For Goal Achievement: 01/18/22 ?Potential to Achieve Goals: Good  ?Plan Discharge plan remains appropriate   ? ?Co-evaluation ? ? ? PT/OT/SLP Co-Evaluation/Treatment: Yes ?Reason for Co-Treatment: For patient/therapist safety ?  ?OT goals addressed during session: ADL's and self-care ?  ? ?  ?AM-PAC OT "6 Clicks" Daily Activity     ?Outcome Measure ? ? Help from another person eating meals?: None ?Help from another person taking care of personal grooming?: A Little ?Help from another person toileting, which includes using toliet, bedpan, or urinal?: A Lot ?Help from another person bathing (including washing, rinsing, drying)?: A Lot ?Help from another person to put on and taking off regular upper body  clothing?: A Little ?Help from another person to put on and taking off regular lower body clothing?: A Lot ?6 Click Score: 16 ? ?  ?End of Session Equipment Utilized During Treatment: Oxygen;Rolling walker (2 wheels);Gait belt ? ?OT Visit Diagnosis: Pain;Muscle weakness (generalized) (M62.81) ?Pain - part of body:  (abdomen) ?  ?Activity Tolerance Patient limited by fatigue ?  ?Patient Left with call bell/phone within reach;with family/visitor present;with bed alarm set;in chair ?  ?Nurse Communication Mobility status ?  ? ?   ? ?Time: 9509-3267 ?OT Time Calculation (min): 31 min ? ?Charges: OT General Charges ?$OT Visit: 1 Visit ?OT Treatments ?$Self Care/Home Management : 8-22 mins ? ?Franki Alcaide OTR/L ?01/07/2022, 1:33 PM ?

## 2022-01-07 NOTE — Progress Notes (Signed)
Pt placed on CPAP for the night. Tol well ?

## 2022-01-07 NOTE — Progress Notes (Signed)
Physical Therapy Treatment ?Patient Details ?Name: Holly [name] ?MRN: [id number] ?DOB: Mar 16, 1946 ?Today's Date: 01/07/2022 ? ? ?History of Present Illness Pt is a 76 y/o female presenting on 3/18 from [facility] for abdominal pain.  Found with sigmoid diverticulitis with contained perforation and abscess formation. 3/21 SIGMOID COLECTOMY AND COLOSTOMY    PMH includes:  L tibial plateau ORIF on 2/27 (osteomyelitis). ?malignancy; anemia, basal cell carcinoma/lung cancer, COPD on 3L O2, HTN, PNA, CKD. ? ?  ?PT Comments  ? ? Pt was seen for progression to chair and BSC from the bed, with less help than yesterday.  Pt was assisted to center hips on a donut cushion from her daughter to ease her skin discomfort from bed.  Pt is using purwick and the donut hinders the placement.  Follow up with mobility in the limits set up by MD, as she is allowed only WBAT on LLE for transfers.  Encourage mobility and time up in chair since pt wishes to go home with daughter.  Working toward more independent movement, unsafe with less than 2 people currently.     ?Recommendations for follow up therapy are one component of a multi-disciplinary discharge planning process, led by the attending physician.  Recommendations may be updated based on patient status, additional functional criteria and insurance authorization. ? ?Follow Up Recommendations ? Home health PT (per family request but they are aware SNF is possible) ?  ?  ?Assistance Recommended at Discharge Frequent or constant Supervision/Assistance  ?Patient can return home with the following A lot of help with walking and/or transfers;A lot of help with bathing/dressing/bathroom;Assistance with cooking/housework;Direct supervision/assist for medications management;Assist for transportation;Help with stairs or ramp for entrance ?  ?Equipment Recommendations ? None recommended by PT  ?  ?Recommendations for Other Services   ? ? ?  ?Precautions / Restrictions  Precautions ?Precautions: Fall ?Precaution Comments: colostomy; rt quadrant JP drain;  recent left tibia fx ?Restrictions ?Weight Bearing Restrictions: Yes ?LLE Weight Bearing: Weight bearing as tolerated ?Other Position/Activity Restrictions: WBAT for transfers only  ?  ? ?Mobility ? Bed Mobility ?Overal bed mobility: Needs Assistance ?Bed Mobility: Rolling, Sidelying to Sit ?Rolling: Min assist ?Sidelying to sit: Min assist, +2 for physical assistance, +2 for safety/equipment, Mod assist ?  ?  ?  ?General bed mobility comments: worked on Economist to protect abd incision ?  ? ?Transfers ?Overall transfer level: Needs assistance ?Equipment used: Rolling walker (2 wheels), 2 person hand held assist ?Transfers: Sit to/from Stand, Bed to chair/wheelchair/BSC ?Sit to Stand: +2 physical assistance, +2 safety/equipment, Mod assist ?Stand pivot transfers: +2 physical assistance, +2 safety/equipment, Mod assist ?Step pivot transfers: +2 physical assistance, +2 safety/equipment, Mod assist ?  ?  ?  ?  ?  ? ?Ambulation/Gait ?  ?  ?  ?  ?  ?  ?  ?General Gait Details: transfers only with WBAT ? ? ?Stairs ?  ?  ?  ?  ?  ? ? ?Wheelchair Mobility ?  ? ?Modified Rankin (Stroke Patients Only) ?  ? ? ?  ?Balance Overall balance assessment: Needs assistance ?Sitting-balance support: Feet supported ?Sitting balance-Leahy Scale: Good ?  ?  ?Standing balance support: Bilateral upper extremity supported, During functional activity ?Standing balance-Leahy Scale: Poor ?  ?  ?  ?  ?  ?  ?  ?  ?  ?  ?  ?  ?  ? ?  ?Cognition Arousal/Alertness: Awake/alert ?Behavior During Therapy: Anxious ?Overall Cognitive Status: Within Functional Limits for  tasks assessed ?  ?  ?  ?  ?  ?  ?  ?  ?  ?  ?  ?  ?  ?  ?  ?  ?General Comments: able to move with extra time to accomplish the task and encouragement ?  ?  ? ?  ?Exercises   ? ?  ?General Comments General comments (skin integrity, edema, etc.): pt was supported to move and balance on RW with  help from PT and OT alternately, initial transfer to [facility] with belt only ?  ?  ? ?Pertinent Vitals/Pain Pain Assessment ?Pain Assessment: Faces ?Faces Pain Scale: Hurts little more ?Breathing: occasional labored breathing, short period of hyperventilation ?Negative Vocalization: occasional moan/groan, low speech, negative/disapproving quality ?Pain Location: abdomen ?Pain Descriptors / Indicators: Grimacing, Guarding ?Pain Intervention(s): Limited activity within patient's tolerance, Monitored during session, Repositioned  ? ? ?Home Living   ?  ?  ?  ?  ?  ?  ?  ?  ?  ?   ?  ?Prior Function    ?  ?  ?   ? ?PT Goals (current goals can now be found in the care plan section) Progress towards PT goals: Progressing toward goals ? ?  ?Frequency ? ? ? Min 3X/week ? ? ? ?  ?PT Plan Current plan remains appropriate  ? ? ?Co-evaluation   ?Reason for Co-Treatment: For patient/therapist safety ?  ?OT goals addressed during session: ADL's and self-care ?  ? ?  ?AM-PAC PT "6 Clicks" Mobility   ?Outcome Measure ? Help needed turning from your back to your side while in a flat bed without using bedrails?: A Little ?Help needed moving from lying on your back to sitting on the side of a flat bed without using bedrails?: A Lot ?Help needed moving to and from a bed to a chair (including a wheelchair)?: A Lot ?Help needed standing up from a chair using your arms (e.g., wheelchair or bedside chair)?: A Lot ?Help needed to walk in hospital room?: Total ?Help needed climbing 3-5 steps with a railing? : Total ?6 Click Score: 11 ? ?  ?End of Session Equipment Utilized During Treatment: Gait belt ?Activity Tolerance: Patient limited by pain;Treatment limited secondary to medical complications (Comment) ?Patient left: in chair;with call bell/phone within reach;with chair alarm set ?Nurse Communication: Mobility status;Weight bearing status (instructions for return to bed and WB on LLE) ?PT Visit Diagnosis: History of falling (Z91.81);Difficulty  in walking, not elsewhere classified (R26.2) ?  ? ? ?Time: 6503-5465 ?PT Time Calculation (min) (ACUTE ONLY): 32 min ? ?Charges:  $Therapeutic Activity: 8-22 mins      ?Ramond Dial ?01/07/2022, 2:01 PM ? ?Mee Hives, PT PhD ?Acute Rehab Dept. Number: [facility] [phone] and [place] [phone] ? ? ?

## 2022-01-07 NOTE — TOC Progression Note (Addendum)
Transition of Care (TOC) - Progression Note  ? ? ?Patient Details  ?Name: Holly [name] ?MRN: [id number] ?Date of Birth: [birth year] ? ?Transition of Care (TOC) CM/SW Contact  ?Verdell Carmine, RN ?Phone Number: ?01/07/2022, 10:56 AM ? ?Clinical Narrative:    ? ?Carolynn Sayers fro [facility] updated on continuation of IV antibiotics after discharge.,Had Daptomycin ordered prior to admit. [name] will continue HH RN, PPT OT Aide, Confirmed with [name] at [facility] and they do have her for those disciplines ? ?Expected Discharge Plan: [place] ?Barriers to Discharge: Continued Medical Work up ? ?Expected Discharge Plan and Services ?Expected Discharge Plan: [place] ?  ?  ?  ?Living arrangements for the past 2 months: [place] ?                ?  ?  ?  ?  ?  ?HH Arranged: PT, OT, Nurse's Aide ?[place] Agency: [facility] ?Date HH Agency Contacted: 01/06/22 ?Time [place]: [id number] ?Representative spoke with at [place]: [name] ? ? ?Social Determinants of Health (SDOH) Interventions ?  ? ?Readmission Risk Interventions ? ?  08/31/2021  ?  2:17 PM 07/28/2021  ?  1:48 PM 02/18/2021  ?  3:22 PM  ?Readmission Risk Prevention Plan  ?Transportation Screening Complete Complete Complete  ?PCP or Specialist Appt within 5-7 Days   Complete  ?Home Care Screening   Complete  ?Medication Review (RN CM)   Complete  ?Medication Review Press photographer) Complete Referral to Pharmacy   ?PCP or Specialist appointment within 3-5 days of discharge Complete Complete   ?[place] or Home Care Consult Complete Patient refused   ?SW Recovery Care/Counseling Consult Complete Complete   ?Palliative Care Screening Not Applicable Not Applicable   ?[place] Not Applicable Not Applicable   ? ? ?

## 2022-01-07 NOTE — Progress Notes (Signed)
?      ?                 PROGRESS NOTE ? ?      ?PATIENT DETAILS ?Name: Holly [name] ?Age: 76 y.o. ?Sex: female ?Date of Birth: Aug 15, 1946 ?Admit Date: 01/01/2022 ?Admitting Physician Norval Morton, MD ?EUM:PNTIR, [name], MD ? ?Brief Summary: ? ?Patient is a 76 y.o.  female with history of HTN, HLD, COPD on 3 L of oxygen at home, lung CA, recent left tibial plateau pathological fracture-found to have MRSA on cultures-on IV daptomycin as outpatient -presented to [facility] for abdominal pain-upon further evaluation she was found to have perforated sigmoid diverticulitis with abscess formation.  She was then transferred to [facility] for further evaluation and treatment.  See below for further details. ? ?Significant events: ?2/27>> ORIF/biopsy of left tibial plateau fracture (biopsy negative for malignancy-culture positive for MRSA) ?3/18>> transfer from [facility]-sigmoid diverticulitis with perforation/abscess formation ?3/20>> per IR-no good target for drain placement at this time. 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 ?Significant studies: ?3/17>> CT abdomen (done at [facility]):-diffuse sigmoid diverticulosis with suggestion of acute complicated diverticulitis.  5.4 x 5.6 cm gas/fluid collection in her pelvis.  Inseparable from sigmoid and posterior wall of urinary bladder.  5.6 x 3.5 cm perisigmoid collection ?3/19>> CT abdomen/pelvis: Similar findings in the pelvis compared to prior CT with acute perforated sigmoid diverticulitis. ? ?Significant microbiology data: ?2/27>> culture left proximal tibia: Rare MRSA ? ?Procedures: ?2/27>> ORIF/biopsy of left tibial fracture (Dr. Doreatha Martin) ? ?Consults: ?General surgery, ID, IR, Ortho ? ?Subjective: ? ?Abdominal pain, no nausea, no vomiting.   ? ? ?Objective: ?Vitals: ?Blood pressure (!) 115/58, pulse 77, temperature 98.4 ?F (36.9 ?C), temperature source Oral, resp. rate 20, height 5' (1.524 m), weight 62.1 kg,  SpO2 97 %.  ? ?Exam: ? ?Awake Alert, Oriented X 3, No new F.N deficits, Normal affect ?Symmetrical Chest wall movement, Good air movement bilaterally, CTAB ?RRR,No Gallops,Rubs or new Murmurs, No Parasternal Heave ?Abdomen soft, midline surgical wound with honeycomb dressing, C/D/I, JP drain with small amount of serosanguineous fluid, colostomy with small amount of air. ?No Cyanosis, Clubbing or edema, No new Rash or bruise   ? ? ? ? ? ?Pertinent Labs/Radiology: ? ?  Latest Ref Rng & Units 01/07/2022  ?  4:21 AM 01/06/2022  ?  4:11 AM 01/05/2022  ?  4:00 AM  ?CBC  ?WBC 4.0 - 10.5 K/uL 17.7   18.5   17.4    ?Hemoglobin 12.0 - 15.0 g/dL 8.5   8.5   9.2    ?Hematocrit 36.0 - 46.0 % 28.3   26.9   29.0    ?Platelets 150 - 400 K/uL 199   170   179    ?  ?Lab Results  ?Component Value Date  ? NA 140 01/07/2022  ? K 3.8 01/07/2022  ? CL 104 01/07/2022  ? CO2 30 01/07/2022  ?  ? ? ?Assessment/Plan: ? ?Perforated sigmoid diverticulitis with pelvic abscess: ?-Failed conservative management, especially no good access for IR to drain abscess.   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 ?-Continue with IV Rocephin and Flagyl, stop date 3/26 ?-WOC consulted regarding colostomy ?-Monitor JP output ?-Management per general surgery, she remains on clear liquid diet, remains on IV antibiotics pending return of bowel function, need to  repeat imaging per general surgery given persistent pain and leukocytosis.   ? ? 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  Follows with Dr. Tommy Medal in the outpatient setting.  Appointment scheduled for 4/5 ?-Cruciate follow-up by orthopedic Dr. Doreatha Martin >> weightbearing:  Advance to WB for transfers only  LLE.  No walker ambulation ? ?Acute blood loss anemia ?-Has anemia of chronic disease at  baseline-worsening anemia is due to acute illness.  No evidence of blood loss.   CBC stable-s/p 1 unit of PRBC on 3/20.  ?-Did require another 1 unit PRBC transfusion intraoperatively. ?-Pain is down to 8.5 today, continue to monitor closely and transfuse as needed. ? ?AKI on CKD stage IIIb: creatinine is up to 1.37 today, continue with IV fluids ? ?Hypothyroidism: Continue Synthroid ? ?GERD: Continue PPI ? 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  ? ?History of lung cancer: Follow with medical oncology/radiation oncology-currently stable. ? ?OSA: Continue CPAP nightly ? ?Anxiety: Relatively stable-continue BuSpar, Wellbutrin and as needed Xanax. ? ?Pressure ulcer:  ?Continue with wound care ?Pressure Injury 01/06/22 Sacrum Stage 2 -  Partial thickness loss of dermis presenting as a shallow open injury with a red, pink wound bed without slough. (Active)  ?01/06/22 1603  ?Location: Sacrum  ?Location Orientation:   ?Staging: Stage 2 -  Partial thickness loss of dermis presenting as a shallow open injury with a red, pink wound bed without slough.  ?Wound Description (Comments):   ?Present on Admission:   ? ?  ? ?BMI: ?Estimated body mass index is 26.76 kg/m? as calculated from the following: ?  Height as of this encounter: 5' (1.524 m). ?  Weight as of this encounter: 62.1 kg.  ? ?Code status: ?  Code Status: DNR  ? ?DVT Prophylaxis: ?enoxaparin (LOVENOX) injection 40 mg Start: 01/05/22 1000 ?Place and maintain sequential compression device Start: 01/01/22 1353 ?  ?Family Communication: None at bedside ? ? ?Disposition Plan: ?Status is: Inpatient ?Remains inpatient appropriate because: Pelvic abscess-perforated sigmoid diverticulitis-no response to conservative measures-plans are for exploratory laparotomy with colectomy/colostomy placement. ?  ?Planned Discharge Destination:Home health ? ? ?Diet: ?Diet Order   ? ?       ?  Diet  clear liquid Room service appropriate? Yes; Fluid consistency: Thin  Diet effective now       ?  ? ?  ?  ? ?  ?  ? ? ?Antimicrobial agents: ?Anti-infectives (From admission, onward)  ? ? Start     Dose/Rate Route Frequency Ordered Stop  ? 01/01/22 2000  DAPTOmycin (CUBICIN) injection 500 mg  Status:  Discontinued       ? 10 mL Intravenous Daily 01/01/22 1400 01/01/22 1425  ? 01/01/22 1515  DAPTOmycin (CUBICIN) 500 mg in sodium chloride 0.9 % IVPB       ? 8 mg/kg ? 62.1 kg ?120 mL/hr over 30 Minutes Intravenous Daily 01/01/22 1425    ? 01/01/22 1430  cefTRIAXone (ROCEPHIN) 2 g in sodium chloride 0.9 % 100 mL IVPB       ? 2 g ?200 mL/hr over 30 Minutes Intravenous Every 24 hours 01/01/22 1344 01/09/22 2359  ? 01/01/22 1430  metroNIDAZOLE (FLAGYL) IVPB 500 mg       ? 500 mg ?100 mL/hr over 60 Minutes Intravenous Every 12 hours 01/01/22 1344 01/09/22 2359  ? 01/01/22 1415  piperacillin-tazobactam (ZOSYN) IVPB 3.375  g  Status:  Discontinued       ? 3.375 g ?12.5 mL/hr over 240 Minutes Intravenous Every 8 hours 01/01/22 1329 01/01/22 1344  ? 01/01/22 1400  piperacillin-tazobactam (ZOSYN) IVPB 3.375 g  Status:  Discontinued       ? 3.375 g ?100 mL/hr over 30 Minutes Intravenous Every 8 hours 01/01/22 1309 01/01/22 1328  ? ?  ? ? ? ?MEDICATIONS: ?Scheduled Meds: ? sodium chloride   Intravenous Once  ? acetaminophen  1,000 mg Oral Q6H  ? atorvastatin  20 mg Oral QHS  ? buPROPion  300 mg Oral q morning  ? busPIRone  5 mg Oral BID  ? chlorhexidine  15 mL Mouth Rinse BID  ? Chlorhexidine Gluconate Cloth  6 each Topical Daily  ? donepezil  10 mg Oral QHS  ? enoxaparin (LOVENOX) injection  40 mg Subcutaneous Daily  ? feeding supplement  1 Container Oral TID BM  ? fluticasone furoate-vilanterol  1 puff Inhalation Daily  ? And  ? umeclidinium bromide  1 puff Inhalation Daily  ? levothyroxine  137 mcg Oral Q0600  ? mouth rinse  15 mL Mouth Rinse q12n4p  ? methocarbamol  500 mg Oral Q6H  ? metoprolol succinate  50 mg Oral Daily  ?  mirtazapine  30 mg Oral QHS  ? multivitamin with minerals  1 tablet Oral Daily  ? pantoprazole  40 mg Oral BID  ? predniSONE  20 mg Oral Daily  ? sodium chloride flush  3 mL Intravenous Q12H  ? ?Continuous I

## 2022-01-08 DIAGNOSIS — J441 Chronic obstructive pulmonary disease with (acute) exacerbation: Secondary | ICD-10-CM | POA: Diagnosis not present

## 2022-01-08 DIAGNOSIS — K5720 Diverticulitis of large intestine with perforation and abscess without bleeding: Secondary | ICD-10-CM | POA: Diagnosis not present

## 2022-01-08 DIAGNOSIS — J9611 Chronic respiratory failure with hypoxia: Secondary | ICD-10-CM | POA: Diagnosis not present

## 2022-01-08 DIAGNOSIS — M869 Osteomyelitis, unspecified: Secondary | ICD-10-CM | POA: Diagnosis not present

## 2022-01-08 LAB — CBC
HCT: 27.9 % — ABNORMAL LOW (ref 36.0–46.0)
Hemoglobin: 8.4 g/dL — ABNORMAL LOW (ref 12.0–15.0)
MCH: 29.2 pg (ref 26.0–34.0)
MCHC: 30.1 g/dL (ref 30.0–36.0)
MCV: 96.9 fL (ref 80.0–100.0)
Platelets: 205 10*3/uL (ref 150–400)
RBC: 2.88 MIL/uL — ABNORMAL LOW (ref 3.87–5.11)
RDW: 17 % — ABNORMAL HIGH (ref 11.5–15.5)
WBC: 13.2 10*3/uL — ABNORMAL HIGH (ref 4.0–10.5)
nRBC: 0.2 % (ref 0.0–0.2)

## 2022-01-08 LAB — BASIC METABOLIC PANEL
Anion gap: 5 (ref 5–15)
BUN: 13 mg/dL (ref 8–23)
CO2: 30 mmol/L (ref 22–32)
Calcium: 8.7 mg/dL — ABNORMAL LOW (ref 8.9–10.3)
Chloride: 105 mmol/L (ref 98–111)
Creatinine, Ser: 0.69 mg/dL (ref 0.44–1.00)
GFR, Estimated: >60 mL/min (ref >60)
Glucose, Bld: 86 mg/dL (ref 70–99)
Potassium: 3.7 mmol/L (ref 3.5–5.1)
Sodium: 140 mmol/L (ref 135–145)

## 2022-01-08 LAB — PHOSPHORUS: Phosphorus: 2.3 mg/dL — ABNORMAL LOW (ref 2.5–4.6)

## 2022-01-08 NOTE — Progress Notes (Signed)
?      ?                 PROGRESS NOTE ? ?      ?PATIENT DETAILS ?Name: Holly [name] ?Age: 76 y.o. ?Sex: female ?Date of Birth: August 01, 1946 ?Admit Date: 01/01/2022 ?Admitting Physician Norval Morton, MD ?IPJ:ASNKN, [name], MD ? ?Brief Summary: ? ?Patient is a 76 y.o.  female with history of HTN, HLD, COPD on 3 L of oxygen at home, lung CA, recent left tibial plateau pathological fracture-found to have MRSA on cultures-on IV daptomycin as outpatient -presented to [place] for abdominal pain-upon further evaluation she was found to have perforated sigmoid diverticulitis with abscess formation.  She was then transferred to [facility] for further evaluation and treatment.  See below for further details. ? ?Significant events: ?2/27>> ORIF/biopsy of left tibial plateau fracture (biopsy negative for malignancy-culture positive for MRSA) ?3/18>> transfer from [facility]-sigmoid diverticulitis with perforation/abscess formation ?3/20>> per IR-no good target for drain placement at this time. 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 ?Significant studies: ?3/17>> CT abdomen (done at [facility]):-diffuse sigmoid diverticulosis with suggestion of acute complicated diverticulitis.  5.4 x 5.6 cm gas/fluid collection in her pelvis.  Inseparable from sigmoid and posterior wall of urinary bladder.  5.6 x 3.5 cm perisigmoid collection ?3/19>> CT abdomen/pelvis: Similar findings in the pelvis compared to prior CT with acute perforated sigmoid diverticulitis. ? ?Significant microbiology data: ?2/27>> culture left proximal tibia: Rare MRSA ? ?Procedures: ?2/27>> ORIF/biopsy of left tibial fracture (Dr. Doreatha Martin) ? ?Consults: ?General surgery, ID, IR, Ortho ? ?Subjective: ? ?Abdominal pain, no nausea, no vomiting.   ? ? ?Objective: ?Vitals: ?Blood pressure (!) 143/63, pulse 71, temperature 98.4 ?F (36.9 ?C), temperature source Oral, resp. rate (!) 21, height 5' (1.524 m), weight 62.1  kg, SpO2 100 %.  ? ?Exam: ? ?Awake Alert, Oriented X 3, No new F.N deficits, Normal affect ?Symmetrical Chest wall movement, Good air movement bilaterally, CTAB ?RRR,No Gallops,Rubs or new Murmurs, No Parasternal Heave ?+ve B.Sounds, Abd Soft, midline surgical wound with honeycomb mesh cover C/D/I, right JP drain with clear sanguinous drain, left colostomy with stool present.Marland Kitchen ?No Cyanosis, Clubbing or edema, No new Rash or bruise   ? ? ? ? ? ? ?Pertinent Labs/Radiology: ? ?  Latest Ref Rng & Units 01/08/2022  ?  4:33 AM 01/07/2022  ?  4:21 AM 01/06/2022  ?  4:11 AM  ?CBC  ?WBC 4.0 - 10.5 K/uL 13.2   17.7   18.5    ?Hemoglobin 12.0 - 15.0 g/dL 8.4   8.5   8.5    ?Hematocrit 36.0 - 46.0 % 27.9   28.3   26.9    ?Platelets 150 - 400 K/uL 205   199   170    ?  ?Lab Results  ?Component Value Date  ? NA 140 01/08/2022  ? K 3.7 01/08/2022  ? CL 105 01/08/2022  ? CO2 30 01/08/2022  ?  ? ? ?Assessment/Plan: ? ?Perforated sigmoid diverticulitis with pelvic abscess: ?-Failed conservative management, especially no good access for IR to drain abscess.   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 ?-Continue with IV Rocephin and Flagyl, stop date 3/26 ?-WOC consulted regarding colostomy ?-Monitor JP output ?-Admit per general surgery, no nausea, no vomiting to clear liquid diet, she is having good stool output  in her colostomy bag today, she will be advanced to full liquid diet.   ?-Leukocytosis trending down which is reassuring.   ? ? 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  Follows with Dr. Tommy Medal in the outpatient setting.  Appointment scheduled for 4/5 ?-Cruciate follow-up by orthopedic Dr. Doreatha Martin >> weightbearing:  Advance to WB for transfers only  LLE.  No walker ambulation ? ?Acute blood loss anemia ?-Has anemia  of chronic disease at baseline-worsening anemia is due to acute illness.  No evidence of blood loss.   CBC stable-s/p 1 unit of PRBC on 3/20.  ?-Did require another 1 unit PRBC transfusion intraoperatively. ?-Hemoglobin is down to 8.4 today, continue to monitor closely and transfuse as needed. ? ?Hypophosphatemia ?-will replete, recheck in a.m. ? ?AKI on CKD stage IIIb: creatinine is up to 1.37 today, continue with IV fluids ? ?Hypothyroidism: Continue Synthroid ? ?GERD: Continue PPI ? 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  ? ?History of lung cancer: Follow with medical oncology/radiation oncology-currently stable. ? ?OSA: Continue CPAP nightly ? ?Anxiety: Relatively stable-continue BuSpar, Wellbutrin and as needed Xanax. ? ?Pressure ulcer:  ?Continue with wound care ?Pressure Injury 01/06/22 Sacrum Stage 2 -  Partial thickness loss of dermis presenting as a shallow open injury with a red, pink wound bed without slough. (Active)  ?01/06/22 1603  ?Location: Sacrum  ?Location Orientation:   ?Staging: Stage 2 -  Partial thickness loss of dermis presenting as a shallow open injury with a red, pink wound bed without slough.  ?Wound Description (Comments):   ?Present on Admission:   ? ?  ? ?BMI: ?Estimated body mass index is 26.76 kg/m? as calculated from the following: ?  Height as of this encounter: 5' (1.524 m). ?  Weight as of this encounter: 62.1 kg.  ? ?Code status: ?  Code Status: DNR  ? ?DVT Prophylaxis: ?enoxaparin (LOVENOX) injection 40 mg Start: 01/05/22 1000 ?Place and maintain sequential compression device Start: 01/01/22 1353 ?  ?Family Communication: None at bedside ? ? ?Disposition Plan: ?Status is: Inpatient ?Remains inpatient appropriate because: Pelvic abscess-perforated sigmoid diverticulitis-no response to conservative measures-plans are for exploratory laparotomy with colectomy/colostomy placement. ?   ?Planned Discharge Destination:Home health ? ? ?Diet: ?Diet Order   ? ?       ?  Diet full liquid Room service appropriate? Yes; Fluid consistency: Thin  Diet effective now       ?  ? ?  ?  ? ?  ?  ? ? ?Antimicrobial agents: ?Anti-infectives (From admission, onward)  ? ? Start     Dose/Rate Route Frequency Ordered Stop  ? 01/01/22 2000  DAPTOmycin (CUBICIN) injection 500 mg  Status:  Discontinued       ? 10 mL Intravenous Daily 01/01/22 1400 01/01/22 1425  ? 01/01/22 1515  DAPTOmycin (CUBICIN) 500 mg in sodium chloride 0.9 % IVPB       ? 8 mg/kg ? 62.1 kg ?120 mL/hr over 30 Minutes Intravenous Daily 01/01/22 1425    ? 01/01/22 1430  cefTRIAXone (ROCEPHIN) 2 g in sodium chloride 0.9 % 100 mL IVPB       ? 2 g ?200 mL/hr over 30 Minutes Intravenous Every 24 hours 01/01/22 1344 01/09/22 2359  ? 01/01/22 1430  metroNIDAZOLE (FLAGYL) IVPB 500 mg       ? 500 mg ?100 mL/hr over 60  Minutes Intravenous Every 12 hours 01/01/22 1344 01/09/22 2359  ? 01/01/22 1415  piperacillin-tazobactam (ZOSYN) IVPB 3.375 g  Status:  Discontinued       ? 3.375 g ?12.5 mL/hr over 240 Minutes Intravenous Every 8 hours 01/01/22 1329 01/01/22 1344  ? 01/01/22 1400  piperacillin-tazobactam (ZOSYN) IVPB 3.375 g  Status:  Discontinued       ? 3.375 g ?100 mL/hr over 30 Minutes Intravenous Every 8 hours 01/01/22 1309 01/01/22 1328  ? ?  ? ? ? ?MEDICATIONS: ?Scheduled Meds: ? sodium chloride   Intravenous Once  ? acetaminophen  1,000 mg Oral Q6H  ? atorvastatin  20 mg Oral QHS  ? buPROPion  300 mg Oral q morning  ? busPIRone  5 mg Oral BID  ? chlorhexidine  15 mL Mouth Rinse BID  ? Chlorhexidine Gluconate Cloth  6 each Topical Daily  ? donepezil  10 mg Oral QHS  ? enoxaparin (LOVENOX) injection  40 mg Subcutaneous Daily  ? feeding supplement  1 Container Oral TID BM  ? fluticasone furoate-vilanterol  1 puff Inhalation Daily  ? And  ? umeclidinium bromide  1 puff Inhalation Daily  ? levothyroxine  137 mcg Oral Q0600  ? mouth rinse  15 mL Mouth Rinse  q12n4p  ? methocarbamol  500 mg Oral Q6H  ? metoprolol succinate  50 mg Oral Daily  ? mirtazapine  30 mg Oral QHS  ? multivitamin with minerals  1 tablet Oral Daily  ? pantoprazole  40 mg Oral BID  ? predniSONE

## 2022-01-08 NOTE — Progress Notes (Signed)
4 Days Post-Op  ? ?Subjective/Chief Complaint: ?No nausea or vomiting ?Beginning to have stool in colostomy ?Pain moderately well-controlled ? ? ? ?Objective: ?Vital signs in last 24 hours: ?Temp:  [97.7 ?F (36.5 ?C)-98.7 ?F (37.1 ?C)] 98.4 ?F (36.9 ?C) (03/25 0746) ?Pulse Rate:  [58-77] 71 (03/25 0746) ?Resp:  [17-21] 21 (03/25 0746) ?BP: (115-149)/(52-63) 143/63 (03/25 0746) ?SpO2:  [95 %-100 %] 100 % (03/25 0810) ?Last BM Date : 01/01/22 (nothing in colostomy bag) ? ?Intake/Output from previous day: ?03/24 0701 - 03/25 0700 ?In: 421.4 [P.O.:240; I.V.:181.4] ?Out: 690 [Urine:600; Drains:90] ?Intake/Output this shift: ?No intake/output data recorded. ? ?Gen:  Alert, NAD, pleasant ?Card:  RRR ?Pulm:  rate and effort normal on [place] ?Abd: soft, mild distension, mild lower abdominal TTP, midline with cdi honeycomb dressing, JP with serosanguinous fluid, colostomy viable with moderate amount of soft brown stool in bag ? ? ?Lab Results:  ?Recent Labs  ?  01/07/22 ?0421 01/08/22 ?7564  ?WBC 17.7* 13.2*  ?HGB 8.5* 8.4*  ?HCT 28.3* 27.9*  ?PLT 199 205  ? ?BMET ?Recent Labs  ?  01/07/22 ?0421 01/08/22 ?0433  ?NA 140 140  ?K 3.8 3.7  ?CL 104 105  ?CO2 30 30  ?GLUCOSE 82 86  ?BUN 17 13  ?CREATININE 0.78 0.69  ?CALCIUM 9.1 8.7*  ? ?PT/INR ?No results for input(s): LABPROT, INR in the last 72 hours. ?ABG ?No results for input(s): PHART, HCO3 in the last 72 hours. ? ?Invalid input(s): PCO2, PO2 ? ?Studies/Results: ?No results found. ? ?Anti-infectives: ?Anti-infectives (From admission, onward)  ? ? Start     Dose/Rate Route Frequency Ordered Stop  ? 01/01/22 2000  DAPTOmycin (CUBICIN) injection 500 mg  Status:  Discontinued       ? 10 mL Intravenous Daily 01/01/22 1400 01/01/22 1425  ? 01/01/22 1515  DAPTOmycin (CUBICIN) 500 mg in sodium chloride 0.9 % IVPB       ? 8 mg/kg ? 62.1 kg ?120 mL/hr over 30 Minutes Intravenous Daily 01/01/22 1425    ? 01/01/22 1430  cefTRIAXone (ROCEPHIN) 2 g in sodium chloride 0.9 % 100 mL IVPB       ?  2 g ?200 mL/hr over 30 Minutes Intravenous Every 24 hours 01/01/22 1344 01/09/22 2359  ? 01/01/22 1430  metroNIDAZOLE (FLAGYL) IVPB 500 mg       ? 500 mg ?100 mL/hr over 60 Minutes Intravenous Every 12 hours 01/01/22 1344 01/09/22 2359  ? 01/01/22 1415  piperacillin-tazobactam (ZOSYN) IVPB 3.375 g  Status:  Discontinued       ? 3.375 g ?12.5 mL/hr over 240 Minutes Intravenous Every 8 hours 01/01/22 1329 01/01/22 1344  ? 01/01/22 1400  piperacillin-tazobactam (ZOSYN) IVPB 3.375 g  Status:  Discontinued       ? 3.375 g ?100 mL/hr over 30 Minutes Intravenous Every 8 hours 01/01/22 1309 01/01/22 1328  ? ?  ? ? ?Assessment/Plan: ?76 year old female with complicated diverticulitis with multiple abscesses not amenable to IR drainage who failed antibiotic treatment requiring exploratory laparotomy with sigmoid colectomy and colostomy on 01/04/22 with Dr. Thermon Leyland. ?  ?- surgical path benign - perforated diverticulitis ?- Full liquids, advance as tolerated. Add Boost breeze ?- continue JP drain and monitor output (sitting in pelvis in location of abscesses) ?- WOC following for new colostomy ?- continue PT/OT - recommending home health ?- Continue penrose drain in midline, remove on day of discharge ?- add scheduled robaxin to scheduled tylenol for improved pain control ?- WBC still  elevated but improving; Continue abx and monitor ?  ?FEN - IVF, FLD ADAT, Boost ?VTE - Lovenox ?ID - cubicin, rocephin, flagyl - recommend at least 4 days postop, or until WBC improves ?Foley - removed ?  ?Acute on chronic anemia - hgb 8.4 today stable. 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  ? LOS: 7 days  ? ? ?Holly [name] ?01/08/2022 ? ?

## 2022-01-09 DIAGNOSIS — M869 Osteomyelitis, unspecified: Secondary | ICD-10-CM | POA: Diagnosis not present

## 2022-01-09 DIAGNOSIS — K5720 Diverticulitis of large intestine with perforation and abscess without bleeding: Secondary | ICD-10-CM | POA: Diagnosis not present

## 2022-01-09 DIAGNOSIS — J9611 Chronic respiratory failure with hypoxia: Secondary | ICD-10-CM | POA: Diagnosis not present

## 2022-01-09 LAB — BASIC METABOLIC PANEL
Anion gap: 5 (ref 5–15)
BUN: 12 mg/dL (ref 8–23)
CO2: 28 mmol/L (ref 22–32)
Calcium: 8.4 mg/dL — ABNORMAL LOW (ref 8.9–10.3)
Chloride: 106 mmol/L (ref 98–111)
Creatinine, Ser: 0.65 mg/dL (ref 0.44–1.00)
GFR, Estimated: >60 mL/min (ref >60)
Glucose, Bld: 117 mg/dL — ABNORMAL HIGH (ref 70–99)
Potassium: 3.9 mmol/L (ref 3.5–5.1)
Sodium: 139 mmol/L (ref 135–145)

## 2022-01-09 LAB — CBC
HCT: 30.2 % — ABNORMAL LOW (ref 36.0–46.0)
Hemoglobin: 8.7 g/dL — ABNORMAL LOW (ref 12.0–15.0)
MCH: 28.6 pg (ref 26.0–34.0)
MCHC: 28.8 g/dL — ABNORMAL LOW (ref 30.0–36.0)
MCV: 99.3 fL (ref 80.0–100.0)
Platelets: 200 10*3/uL (ref 150–400)
RBC: 3.04 MIL/uL — ABNORMAL LOW (ref 3.87–5.11)
RDW: 17.1 % — ABNORMAL HIGH (ref 11.5–15.5)
WBC: 12.9 10*3/uL — ABNORMAL HIGH (ref 4.0–10.5)
nRBC: 0 % (ref 0.0–0.2)

## 2022-01-09 LAB — CK: Total CK: 30 U/L — ABNORMAL LOW (ref 38–234)

## 2022-01-09 MED ORDER — HYDROMORPHONE HCL 1 MG/ML IJ SOLN
0.5000 mg | Freq: Four times a day (QID) | INTRAMUSCULAR | Status: DC | PRN
  Administered 2022-01-09 – 2022-01-12 (×6): 0.5 mg via INTRAVENOUS
  Filled 2022-01-09 (×8): qty 0.5

## 2022-01-09 NOTE — Plan of Care (Signed)
  Problem: Education: Goal: Knowledge of General Education information will improve Description Including pain rating scale, medication(s)/side effects and non-pharmacologic comfort measures Outcome: Progressing   Problem: Health Behavior/Discharge Planning: Goal: Ability to manage health-related needs will improve Outcome: Progressing   

## 2022-01-09 NOTE — Progress Notes (Signed)
5 Days Post-Op  ? ?Subjective/Chief Complaint: ?Tolerating soft diet ?Stool in colostomy bag ?Pain - still requiring frequent pain meds ? ? ?Objective: ?Vital signs in last 24 hours: ?Temp:  [97.9 ?F (36.6 ?C)-98.5 ?F (36.9 ?C)] 98.5 ?F (36.9 ?C) (03/26 0744) ?Pulse Rate:  [61-75] 75 (03/26 0744) ?Resp:  [15-23] 23 (03/26 0744) ?BP: (126-149)/(52-60) 149/55 (03/26 0744) ?SpO2:  [91 %-100 %] 96 % (03/26 0917) ?Last BM Date : 01/08/22 (colostomy) ? ?Intake/Output from previous day: ?03/25 0701 - 03/26 0700 ?In: 1200 [P.O.:1200] ?Out: 1211 [Urine:600; Drains:210; Stool:401] ?Intake/Output this shift: ?No intake/output data recorded. ? ?Gen:  Alert, NAD, pleasant ?Card:  RRR ?Pulm:  rate and effort normal on [place] ?Abd: soft, mild distension, mild lower abdominal TTP, midline with cdi honeycomb dressing, JP with serosanguinous fluid, colostomy viable with moderate amount of soft brown stool in bag ? ?Lab Results:  ?Recent Labs  ?  01/08/22 ?0258 01/09/22 ?0022  ?WBC 13.2* 12.9*  ?HGB 8.4* 8.7*  ?HCT 27.9* 30.2*  ?PLT 205 200  ? ?BMET ?Recent Labs  ?  01/08/22 ?0433 01/09/22 ?0022  ?NA 140 139  ?K 3.7 3.9  ?CL 105 106  ?CO2 30 28  ?GLUCOSE 86 117*  ?BUN 13 12  ?CREATININE 0.69 0.65  ?CALCIUM 8.7* 8.4*  ? ?PT/INR ?No results for input(s): LABPROT, INR in the last 72 hours. ?ABG ?No results for input(s): PHART, HCO3 in the last 72 hours. ? ?Invalid input(s): PCO2, PO2 ? ?Studies/Results: ?No results found. ? ?Anti-infectives: ?Anti-infectives (From admission, onward)  ? ? Start     Dose/Rate Route Frequency Ordered Stop  ? 01/01/22 2000  DAPTOmycin (CUBICIN) injection 500 mg  Status:  Discontinued       ? 10 mL Intravenous Daily 01/01/22 1400 01/01/22 1425  ? 01/01/22 1515  DAPTOmycin (CUBICIN) 500 mg in sodium chloride 0.9 % IVPB       ? 8 mg/kg ? 62.1 kg ?120 mL/hr over 30 Minutes Intravenous Daily 01/01/22 1425    ? 01/01/22 1430  cefTRIAXone (ROCEPHIN) 2 g in sodium chloride 0.9 % 100 mL IVPB       ? 2 g ?200 mL/hr over  30 Minutes Intravenous Every 24 hours 01/01/22 1344 01/09/22 2359  ? 01/01/22 1430  metroNIDAZOLE (FLAGYL) IVPB 500 mg       ? 500 mg ?100 mL/hr over 60 Minutes Intravenous Every 12 hours 01/01/22 1344 01/09/22 2359  ? 01/01/22 1415  piperacillin-tazobactam (ZOSYN) IVPB 3.375 g  Status:  Discontinued       ? 3.375 g ?12.5 mL/hr over 240 Minutes Intravenous Every 8 hours 01/01/22 1329 01/01/22 1344  ? 01/01/22 1400  piperacillin-tazobactam (ZOSYN) IVPB 3.375 g  Status:  Discontinued       ? 3.375 g ?100 mL/hr over 30 Minutes Intravenous Every 8 hours 01/01/22 1309 01/01/22 1328  ? ?  ? ? ?Assessment/Plan: ?76 year old female with complicated diverticulitis with multiple abscesses not amenable to IR drainage who failed antibiotic treatment requiring exploratory laparotomy with sigmoid colectomy and colostomy on 01/04/22 with Dr. Thermon Leyland. ?  ?- surgical path benign - perforated diverticulitis ?- Soft diet. Add Boost breeze ?- continue JP drain and monitor output (sitting in pelvis in location of abscesses); remove prior to discharge ?- WOC following for new colostomy ?- continue PT/OT - recommending home health ?- Continue penrose drain in midline, remove on day of discharge ?- add scheduled robaxin to scheduled tylenol for improved pain control ?- WBC still elevated but improving;  Continue abx and monitor ?- Patient progressing towards discharge from surgical standpoint ?  ?FEN - IVF, FLD ADAT, Boost ?VTE - Lovenox ?ID - cubicin, rocephin, flagyl  ?Foley - removed ?  ?Acute on chronic anemia - hgb 8.4 today stable. 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 LOS: 8 days  ? ? ?Holly [name] ?01/09/2022 ? ?

## 2022-01-09 NOTE — Progress Notes (Signed)
?      ?                 PROGRESS NOTE ? ?      ?PATIENT DETAILS ?Name: Holly [name] ?Age: 76 y.o. ?Sex: female ?Date of Birth: [birth year] ?Admit Date: 01/01/2022 ?Admitting Physician Norval Morton, MD ?[id number], [name], MD ? ?Brief Summary: ? ?Patient is a 76 y.o.  female with history of HTN, HLD, COPD on 3 L of oxygen at home, lung CA, recent left tibial plateau pathological fracture-found to have MRSA on cultures-on IV daptomycin as outpatient -presented to [facility] for abdominal pain-upon further evaluation she was found to have perforated sigmoid diverticulitis with abscess formation.  She was then transferred to [facility] for further evaluation and treatment.  See below for further details. ? ?Significant events: ?2/27>> ORIF/biopsy of left tibial plateau fracture (biopsy negative for malignancy-culture positive for MRSA) ?3/18>> transfer from [facility]-sigmoid diverticulitis with perforation/abscess formation ?3/20>> per IR-no good target for drain placement at this time. 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 ?Significant studies: ?3/17>> CT abdomen (done at [facility]):-diffuse sigmoid diverticulosis with suggestion of acute complicated diverticulitis.  5.4 x 5.6 cm gas/fluid collection in her pelvis.  Inseparable from sigmoid and posterior wall of urinary bladder.  5.6 x 3.5 cm perisigmoid collection ?3/19>> CT abdomen/pelvis: Similar findings in the pelvis compared to prior CT with acute perforated sigmoid diverticulitis. ? ?Significant microbiology data: ?2/27>> culture left proximal tibia: Rare MRSA ? ?Procedures: ?2/27>> ORIF/biopsy of left tibial fracture (Dr. Doreatha Martin) ? ?Consults: ?General surgery, ID, IR, Ortho ? ?Subjective: ? ?Still complains of abdominal pain, no nausea, no vomiting ? ? ?Objective: ?Vitals: ?Blood pressure (!) 132/55, pulse 74, temperature 97.9 ?F (36.6 ?C), temperature source Oral, resp. rate 18, height 5' (1.524 m),  weight 62.1 kg, SpO2 98 %.  ? ?Exam: ? ?Awake Alert, Oriented X 3, No new F.N deficits, Normal affect ?Symmetrical Chest wall movement, Good air movement bilaterally, CTAB ?RRR,No Gallops,Rubs or new Murmurs, No Parasternal Heave ?+ve B.Sounds, Abd Soft, [place] scar C/T/I covered with honeycomb mesh, JP drain in right abdomen, left colostomy with stools ?No Cyanosis, Clubbing or edema, No new Rash or bruise   ? ? ? ? ? ? ? ?Pertinent Labs/Radiology: ? ?  Latest Ref Rng & Units 01/09/2022  ? 12:22 AM 01/08/2022  ?  4:33 AM 01/07/2022  ?  4:21 AM  ?CBC  ?WBC 4.0 - 10.5 K/uL 12.9   13.2   17.7    ?Hemoglobin 12.0 - 15.0 g/dL 8.7   8.4   8.5    ?Hematocrit 36.0 - 46.0 % 30.2   27.9   28.3    ?Platelets 150 - 400 K/uL 200   205   199    ?  ?Lab Results  ?Component Value Date  ? NA 139 01/09/2022  ? K 3.9 01/09/2022  ? CL 106 01/09/2022  ? CO2 28 01/09/2022  ?  ? ? ?Assessment/Plan: ? ?Perforated sigmoid diverticulitis with pelvic abscess: ?-Failed conservative management, especially no good access for IR to drain abscess.   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 ?-Continue with IV Rocephin and Flagyl, stop date 3/26 ?-WOC consulted regarding colostomy ?-Monitor JP output ?-Admit per general surgery, no nausea, no vomiting, tolerating soft diet, had good output in her colostomy bag, advance to soft diet  today.   ?-Leukocytosis continues to trend down which is reassuring. ? ? 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  Follows with Dr. Tommy Medal in the outpatient setting.  Appointment scheduled for 4/5 ?-Cruciate follow-up by orthopedic Dr. Doreatha Martin >> weightbearing:  Advance to WB for transfers only  LLE.  No walker ambulation ? ?Acute blood loss anemia ?-Has anemia of chronic disease at baseline-worsening anemia is  due to acute illness.  No evidence of blood loss.   CBC stable-s/p 1 unit of PRBC on 3/20.  ?-Did require another 1 unit PRBC transfusion intraoperatively. ?-Hemoglobin stable at 8.7 today, no indication for transfusion, ? ?Hypophosphatemia ?-will replete, recheck in a.m. ? ?AKI on CKD stage IIIb: creatinine is up to 1.37 today, continue with IV fluids ? ?Hypothyroidism: Continue Synthroid ? ?GERD: Continue PPI ? 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  ? ?History of lung cancer: Follow with medical oncology/radiation oncology-currently stable. ? ?OSA: Continue CPAP nightly ? ?Anxiety: Relatively stable-continue BuSpar, Wellbutrin and as needed Xanax. ? ?Pressure ulcer:  ?Continue with wound care ?Pressure Injury 01/06/22 Sacrum Stage 2 -  Partial thickness loss of dermis presenting as a shallow open injury with a red, pink wound bed without slough. (Active)  ?01/06/22 1603  ?Location: Sacrum  ?Location Orientation:   ?Staging: Stage 2 -  Partial thickness loss of dermis presenting as a shallow open injury with a red, pink wound bed without slough.  ?Wound Description (Comments):   ?Present on Admission:   ? ?  ? ?BMI: ?Estimated body mass index is 26.76 kg/m? as calculated from the following: ?  Height as of this encounter: 5' (1.524 m). ?  Weight as of this encounter: 62.1 kg.  ? ?Code status: ?  Code Status: DNR  ? ?DVT Prophylaxis: ?enoxaparin (LOVENOX) injection 40 mg Start: 01/05/22 1000 ?Place and maintain sequential compression device Start: 01/01/22 1353 ?  ?Family Communication: None at bedside ? ? ?Disposition Plan: ?Status is: Inpatient ?Remains inpatient appropriate because: Pelvic abscess-perforated sigmoid diverticulitis-no response to conservative measures-plans are for exploratory laparotomy with colectomy/colostomy placement. ?  ?Planned Discharge Destination:Home health ? ? ?Diet: ?Diet Order   ? ?       ?   DIET SOFT Room service appropriate? Yes; Fluid consistency: Thin  Diet effective now       ?  ? ?  ?  ? ?  ?  ? ? ?Antimicrobial agents: ?Anti-infectives (From admission, onward)  ? ? Start     Dose/Rate Route Frequency Ordered Stop  ? 01/01/22 2000  DAPTOmycin (CUBICIN) injection 500 mg  Status:  Discontinued       ? 10 mL Intravenous Daily 01/01/22 1400 01/01/22 1425  ? 01/01/22 1515  DAPTOmycin (CUBICIN) 500 mg in sodium chloride 0.9 % IVPB       ? 8 mg/kg ? 62.1 kg ?120 mL/hr over 30 Minutes Intravenous Daily 01/01/22 1425    ? 01/01/22 1430  cefTRIAXone (ROCEPHIN) 2 g in sodium chloride 0.9 % 100 mL IVPB       ? 2 g ?200 mL/hr over 30 Minutes Intravenous Every 24 hours 01/01/22 1344 01/09/22 2359  ? 01/01/22 1430  metroNIDAZOLE (FLAGYL) IVPB 500 mg       ? 500 mg ?100 mL/hr over 60 Minutes Intravenous Every 12 hours 01/01/22 1344 01/09/22 2359  ? 01/01/22 1415  piperacillin-tazobactam (ZOSYN) IVPB 3.375  g  Status:  Discontinued       ? 3.375 g ?12.5 mL/hr over 240 Minutes Intravenous Every 8 hours 01/01/22 1329 01/01/22 1344  ? 01/01/22 1400  piperacillin-tazobactam (ZOSYN) IVPB 3.375 g  Status:  Discontinued       ? 3.375 g ?100 mL/hr over 30 Minutes Intravenous Every 8 hours 01/01/22 1309 01/01/22 1328  ? ?  ? ? ? ?MEDICATIONS: ?Scheduled Meds: ? sodium chloride   Intravenous Once  ? acetaminophen  1,000 mg Oral Q6H  ? atorvastatin  20 mg Oral QHS  ? buPROPion  300 mg Oral q morning  ? busPIRone  5 mg Oral BID  ? chlorhexidine  15 mL Mouth Rinse BID  ? Chlorhexidine Gluconate Cloth  6 each Topical Daily  ? donepezil  10 mg Oral QHS  ? enoxaparin (LOVENOX) injection  40 mg Subcutaneous Daily  ? feeding supplement  1 Container Oral TID BM  ? fluticasone furoate-vilanterol  1 puff Inhalation Daily  ? And  ? umeclidinium bromide  1 puff Inhalation Daily  ? levothyroxine  137 mcg Oral Q0600  ? mouth rinse  15 mL Mouth Rinse q12n4p  ? methocarbamol  500 mg Oral Q6H  ? metoprolol succinate  50 mg Oral Daily  ?  mirtazapine  30 mg Oral QHS  ? multivitamin with minerals  1 tablet Oral Daily  ? pantoprazole  40 mg Oral BID  ? predniSONE  20 mg Oral Daily  ? sodium chloride flush  3 mL Intravenous Q12H  ? ?Continuo

## 2022-01-10 ENCOUNTER — Encounter: Payer: Self-pay | Admitting: *Deleted

## 2022-01-10 DIAGNOSIS — J9611 Chronic respiratory failure with hypoxia: Secondary | ICD-10-CM | POA: Diagnosis not present

## 2022-01-10 DIAGNOSIS — M869 Osteomyelitis, unspecified: Secondary | ICD-10-CM | POA: Diagnosis not present

## 2022-01-10 DIAGNOSIS — K5720 Diverticulitis of large intestine with perforation and abscess without bleeding: Secondary | ICD-10-CM | POA: Diagnosis not present

## 2022-01-10 LAB — CBC
HCT: 26.8 % — ABNORMAL LOW (ref 36.0–46.0)
Hemoglobin: 7.8 g/dL — ABNORMAL LOW (ref 12.0–15.0)
MCH: 28.7 pg (ref 26.0–34.0)
MCHC: 29.1 g/dL — ABNORMAL LOW (ref 30.0–36.0)
MCV: 98.5 fL (ref 80.0–100.0)
Platelets: 239 10*3/uL (ref 150–400)
RBC: 2.72 MIL/uL — ABNORMAL LOW (ref 3.87–5.11)
RDW: 17.1 % — ABNORMAL HIGH (ref 11.5–15.5)
WBC: 12 10*3/uL — ABNORMAL HIGH (ref 4.0–10.5)
nRBC: 0.2 % (ref 0.0–0.2)

## 2022-01-10 LAB — URINALYSIS, ROUTINE W REFLEX MICROSCOPIC
Bilirubin Urine: NEGATIVE
Glucose, UA: NEGATIVE mg/dL
Hgb urine dipstick: NEGATIVE
Ketones, ur: NEGATIVE mg/dL
Leukocytes,Ua: NEGATIVE
Nitrite: NEGATIVE
Protein, ur: NEGATIVE mg/dL
Specific Gravity, Urine: 1.005 (ref 1.005–1.030)
pH: 5 (ref 5.0–8.0)

## 2022-01-10 LAB — BASIC METABOLIC PANEL
Anion gap: 4 — ABNORMAL LOW (ref 5–15)
BUN: 12 mg/dL (ref 8–23)
CO2: 30 mmol/L (ref 22–32)
Calcium: 8.3 mg/dL — ABNORMAL LOW (ref 8.9–10.3)
Chloride: 104 mmol/L (ref 98–111)
Creatinine, Ser: 0.59 mg/dL (ref 0.44–1.00)
GFR, Estimated: >60 mL/min (ref >60)
Glucose, Bld: 115 mg/dL — ABNORMAL HIGH (ref 70–99)
Potassium: 3.8 mmol/L (ref 3.5–5.1)
Sodium: 138 mmol/L (ref 135–145)

## 2022-01-10 LAB — PREPARE RBC (CROSSMATCH)

## 2022-01-10 MED ORDER — BOOST / RESOURCE BREEZE PO LIQD CUSTOM
1.0000 | Freq: Two times a day (BID) | ORAL | Status: DC
  Administered 2022-01-11: 1 via ORAL

## 2022-01-10 MED ORDER — FUROSEMIDE 10 MG/ML IJ SOLN
40.0000 mg | Freq: Once | INTRAMUSCULAR | Status: AC
Start: 2022-01-10 — End: 2022-01-10
  Administered 2022-01-10: 40 mg via INTRAVENOUS
  Filled 2022-01-10: qty 4

## 2022-01-10 MED ORDER — ALPRAZOLAM 0.5 MG PO TABS
0.5000 mg | ORAL_TABLET | Freq: Three times a day (TID) | ORAL | Status: DC | PRN
  Administered 2022-01-10: 0.5 mg via ORAL
  Filled 2022-01-10: qty 1

## 2022-01-10 MED ORDER — SODIUM CHLORIDE 0.9% IV SOLUTION: Freq: Once | INTRAVENOUS | Status: AC

## 2022-01-10 MED ORDER — ENSURE ENLIVE PO LIQD
237.0000 mL | Freq: Two times a day (BID) | ORAL | Status: DC
  Administered 2022-01-10 – 2022-01-11 (×2): 237 mL via ORAL

## 2022-01-10 NOTE — Progress Notes (Signed)
?      ?                 PROGRESS NOTE ? ?      ?PATIENT DETAILS ?Name: Holly [name] ?Age: 76 y.o. ?Sex: female ?Date of Birth: Aug 03, 1946 ?Admit Date: 01/01/2022 ?Admitting Physician Norval Morton, MD ?[id number], [name], MD ? ?Brief Summary: ? ?Patient is a 76 y.o.  female with history of HTN, HLD, COPD on 3 L of oxygen at home, lung CA, recent left tibial plateau pathological fracture-found to have MRSA on cultures-on IV daptomycin as outpatient -presented to [facility] for abdominal pain-upon further evaluation she was found to have perforated sigmoid diverticulitis with abscess formation.  She was then transferred to [facility] for further evaluation and treatment.  See below for further details. ? ?Significant events: ?2/27>> ORIF/biopsy of left tibial plateau fracture (biopsy negative for malignancy-culture positive for MRSA) ?3/18>> transfer from [facility]-sigmoid diverticulitis with perforation/abscess formation ?3/20>> per IR-no good target for drain placement at this time. ?3/21>> worsening abdominal pain s/p sigmoid colectomy with end colostomy on 01/04/22 ?3/27>> 1 unit PRBC transfusion for hemoglobin of 7.8. ? ?Significant studies: ?3/17>> CT abdomen (done at [facility]):-diffuse sigmoid diverticulosis with suggestion of acute complicated diverticulitis.  5.4 x 5.6 cm gas/fluid collection in her pelvis.  Inseparable from sigmoid and posterior wall of urinary bladder.  5.6 x 3.5 cm perisigmoid collection ?3/19>> CT abdomen/pelvis: Similar findings in the pelvis compared to prior CT with acute perforated sigmoid diverticulitis. ? ?Significant microbiology data: ?2/27>> culture left proximal tibia: Rare MRSA ? ?Procedures: ?2/27>> ORIF/biopsy of left tibial fracture (Dr. Doreatha Martin) ? ?Consults: ?General surgery, ID, IR, Ortho ? ?Subjective: ? ?No nausea, no vomiting, Elling of generalized weakness, pain is better controlled. ? ? ?Objective: ?Vitals: ?Blood pressure (!) 114/47, pulse 72, temperature  (!) 97.3 ?F (36.3 ?C), temperature source Oral, resp. rate 17, height 5' (1.524 m), weight 62.1 kg, SpO2 92 %.  ? ?Exam: ? ?Awake Alert, Oriented X 3, No new F.N deficits, Normal affect ?Symmetrical Chest wall movement, Good air movement bilaterally, CTAB ?RRR,No Gallops,Rubs or new Murmurs, No Parasternal Heave ?+ve B.Sounds, ?Surgical wound with honeycomb mesh C/D/I, left lower abdomen colostomy, right JP drain present. ?No Cyanosis, Clubbing or edema, No new Rash or bruise   ? ? ? ? ? ? ? ?Pertinent Labs/Radiology: ? ?  Latest Ref Rng & Units 01/10/2022  ?  4:21 AM 01/09/2022  ? 12:22 AM 01/08/2022  ?  4:33 AM  ?CBC  ?WBC 4.0 - 10.5 K/uL 12.0   12.9   13.2    ?Hemoglobin 12.0 - 15.0 g/dL 7.8   8.7   8.4    ?Hematocrit 36.0 - 46.0 % 26.8   30.2   27.9    ?Platelets 150 - 400 K/uL 239   200   205    ?  ?Lab Results  ?Component Value Date  ? NA 138 01/10/2022  ? K 3.8 01/10/2022  ? CL 104 01/10/2022  ? CO2 30 01/10/2022  ?  ? ? ?Assessment/Plan: ? ?Perforated sigmoid diverticulitis with pelvic abscess: ?-Failed conservative management, especially no good access for IR to drain abscess.   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 ?-Treat with IV Rocephin and Flagyl, antibiotics stopped 3/26. ?-WOC consulted regarding colostomy ?-Monitor JP output ?-Management per general surgery, no nausea, no vomiting, she is tolerating her soft  diet.  Has good output at colostomy.   ?-Leukocytosis continues to trend down which is reassuring. ? ? 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  Follows with Dr. Tommy Medal in the outpatient setting.  Appointment scheduled for 4/5 ?-Cruciate follow-up by orthopedic Dr. Doreatha Martin >> weightbearing:  Advance to WB for transfers only  LLE.  No walker ambulation ? ?Acute blood loss  anemia ?-Has anemia of chronic disease at baseline-worsening anemia is due to acute illness.  No evidence of blood loss.   CBC stable-s/p 1 unit of PRBC on 3/20.  ?-Did require another 1 unit PRBC transfusion intraoperatively. ?-Hemoglobin 7.8 this morning, will transfuse 1 unit PRBC. ? ?Hypophosphatemia ?-will replete, recheck in a.m. ? ?AKI on CKD stage IIIb: creatinine is up to 1.37 today, continue with IV fluids ? ?Hypothyroidism: Continue Synthroid ? ?GERD: Continue PPI ? 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  ? ?History of lung cancer: Follow with medical oncology/radiation oncology-currently stable. ? ?OSA: Continue CPAP nightly ? ?Anxiety: Relatively stable-continue BuSpar, Wellbutrin and as needed Xanax. ? ?Pressure ulcer:  ?Continue with wound care ?Pressure Injury 01/06/22 Sacrum Stage 2 -  Partial thickness loss of dermis presenting as a shallow open injury with a red, pink wound bed without slough. (Active)  ?01/06/22 1603  ?Location: Sacrum  ?Location Orientation:   ?Staging: Stage 2 -  Partial thickness loss of dermis presenting as a shallow open injury with a red, pink wound bed without slough.  ?Wound Description (Comments):   ?Present on Admission:   ? ?  ? ?BMI: ?Estimated body mass index is 26.76 kg/m? as calculated from the following: ?  Height as of this encounter: 5' (1.524 m). ?  Weight as of this encounter: 62.1 kg.  ? ?Code status: ?  Code Status: DNR  ? ?DVT Prophylaxis: ?enoxaparin (LOVENOX) injection 40 mg Start: 01/05/22 1000 ?Place and maintain sequential compression device Start: 01/01/22 1353 ?  ?Family Communication: None at bedside ? ? ?Disposition Plan: ?Status is: Inpatient ?Remains inpatient appropriate because: Pelvic abscess-perforated sigmoid diverticulitis-no response to conservative measures-plans are for exploratory laparotomy with colectomy/colostomy placement. ?  ?Planned  Discharge Destination:Home health ? ? ?Diet: ?Diet Order   ? ?       ?  DIET SOFT Room service appropriate? Yes; Fluid consistency: Thin  Diet effective now       ?  ? ?  ?  ? ?  ?  ? ? ?Antimicrobial agents: ?Anti-infectives (From admission, onward)  ? ? Start     Dose/Rate Route Frequency Ordered Stop  ? 01/01/22 2000  DAPTOmycin (CUBICIN) injection 500 mg  Status:  Discontinued       ? 10 mL Intravenous Daily 01/01/22 1400 01/01/22 1425  ? 01/01/22 1515  DAPTOmycin (CUBICIN) 500 mg in sodium chloride 0.9 % IVPB       ? 8 mg/kg ? 62.1 kg ?120 mL/hr over 30 Minutes Intravenous Daily 01/01/22 1425    ? 01/01/22 1430  cefTRIAXone (ROCEPHIN) 2 g in sodium chloride 0.9 % 100 mL IVPB       ? 2 g ?200 mL/hr over 30 Minutes Intravenous Every 24 hours 01/01/22 1344 01/09/22 2359  ? 01/01/22 1430  metroNIDAZOLE (FLAGYL) IVPB 500 mg       ? 500 mg ?100 mL/hr over 60 Minutes Intravenous Every 12 hours 01/01/22 1344 01/09/22 1724  ? 01/01/22  1415  piperacillin-tazobactam (ZOSYN) IVPB 3.375 g  Status:  Discontinued       ? 3.375 g ?12.5 mL/hr over 240 Minutes Intravenous Every 8 hours 01/01/22 1329 01/01/22 1344  ? 01/01/22 1400  piperacillin-tazobactam (ZOSYN) IVPB 3.375 g  Status:  Discontinued       ? 3.375 g ?100 mL/hr over 30 Minutes Intravenous Every 8 hours 01/01/22 1309 01/01/22 1328  ? ?  ? ? ? ?MEDICATIONS: ?Scheduled Meds: ? sodium chloride   Intravenous Once  ? sodium chloride   Intravenous Once  ? acetaminophen  1,000 mg Oral Q6H  ? atorvastatin  20 mg Oral QHS  ? buPROPion  300 mg Oral q morning  ? busPIRone  5 mg Oral BID  ? chlorhexidine  15 mL Mouth Rinse BID  ? Chlorhexidine Gluconate Cloth  6 each Topical Daily  ? donepezil  10 mg Oral QHS  ? enoxaparin (LOVENOX) injection  40 mg Subcutaneous Daily  ? feeding supplement  1 Container Oral TID BM  ? fluticasone furoate-vilanterol  1 puff Inhalation Daily  ? And  ? umeclidinium bromide  1 puff Inhalation Daily  ? levothyroxine  137 mcg Oral Q0600  ? mouth rinse   15 mL Mouth Rinse q12n4p  ? methocarbamol  500 mg Oral Q6H  ? metoprolol succinate  50 mg Oral Daily  ? mirtazapine  30 mg Oral QHS  ? multivitamin with minerals  1 tablet Oral Daily  ? pantoprazole  40 mg Oral BID

## 2022-01-10 NOTE — Progress Notes (Signed)
Oncology Nurse Navigator Documentation ? ? ?  01/10/2022  ? 11:00 AM 12/07/2021  ?  3:00 PM 09/25/2019  ?  3:00 PM 07/04/2019  ? 10:00 AM  ?Oncology Nurse Navigator Flowsheets  ?Abnormal Finding Date    04/25/2019  ?Confirmed Diagnosis Date    06/25/2019  ?Diagnosis Status    Confirmed Diagnosis Complete  ?Planned Course of Treatment    Radiation  ?Navigator Follow Up Date: 03/07/2022 12/08/2021  07/10/2019  ?Navigator Follow Up Reason: Appointment Review Patient [name]  Appointment Review  ?Navigation Complete Date:   09/25/2019   ?Reason Not Navigating Patient:   Other:   ?Navigator Location [place]  ?Referral Date to RadOnc/MedOnc    07/04/2019  ?Navigator Encounter Type Other: Telephone  Telephone  ?Telephone  Outgoing [name]  Holly [name]  ?Patient Visit Type Inpatient Other    ?Treatment Phase  Pre-Tx/Tx Discussion  Pre-Tx/Tx Discussion  ?Barriers/Navigation Needs Coordination of Care/I updated Dr. Julien Nordmann on patient status.  He states he would like to see her in 2 months. I will completed scheduling message for her to get called and scheduled in 2 months.  Coordination of Care;Education  Education  ?Education  Other  Other  ?Interventions Coordination of Care Coordination of Care;Education  Education  ?Acuity Level 2-Minimal Needs (1-2 Barriers Identified) Level 2-Minimal Needs (1-2 Barriers Identified)  Level 2-Minimal Needs (1-2 Barriers Identified)  ?Coordination of Care Other Other    ?Education Method    Verbal  ?Time Spent with Patient 30 15  30   ?  ?

## 2022-01-10 NOTE — Progress Notes (Signed)
Pt placed on CPAP for night rest.  Tolerating well. ?

## 2022-01-10 NOTE — Progress Notes (Addendum)
Physical Therapy Treatment ?Patient Details ?Name: Holly [name] ?MRN: [id number] ?DOB: 07-14-1946 ?Today's Date: 01/10/2022 ? ? ?History of Present Illness Pt is a 76 y/o female presenting on 3/18 from [facility] for abdominal pain.  Found with sigmoid diverticulitis with contained perforation and abscess formation. 3/21 SIGMOID COLECTOMY AND COLOSTOMY    PMH includes:  L tibial plateau ORIF on 2/27 (osteomyelitis). ?malignancy; anemia, basal cell carcinoma/lung cancer, COPD on 3L O2, HTN, PNA, CKD. ? ?  ?PT Comments  ? ? Pt admitted with above diagnosis. Pt was able to transfer to the chair with [place] with total assist. Stood to [place] with min assist.  Exercises with supervision and cues. Progressing.  Pt is interested in SNF and feel this would benefit pt.   Pt currently with functional limitations due to balance and endurance deficits. Pt will benefit from skilled PT to increase their independence and safety with mobility to allow discharge to the venue listed below.      ?Recommendations for follow up therapy are one component of a multi-disciplinary discharge planning process, led by the attending physician.  Recommendations may be updated based on patient status, additional functional criteria and insurance authorization. ? ?Follow Up Recommendations ? Skilled nursing-short term rehab (<3 hours/day) ?  ?  ?Assistance Recommended at Discharge Frequent or constant Supervision/Assistance  ?Patient can return home with the following A lot of help with walking and/or transfers;A lot of help with bathing/dressing/bathroom;Assistance with cooking/housework;Direct supervision/assist for medications management;Assist for transportation;Help with stairs or ramp for entrance ?  ?Equipment Recommendations ? None recommended by PT  ?  ?Recommendations for Other Services   ? ? ?  ?Precautions / Restrictions Precautions ?Precautions: Fall ?Precaution Comments: colostomy; rt quadrant JP drain;  recent left tibia  fx ?Restrictions ?Weight Bearing Restrictions: Yes ?LLE Weight Bearing: Weight bearing as tolerated ?Other Position/Activity Restrictions: WBAT for transfers only  ?  ? ?Mobility ? Bed Mobility ?Overal bed mobility: Needs Assistance ?Bed Mobility: Rolling ?Rolling: Min assist ?Sidelying to sit: HOB elevated, Min assist ?  ?  ?  ?General bed mobility comments: assist to advance BLE. Painful to touch, extra time needed. ?  ? ?Transfers ?Overall transfer level: Needs assistance ?Equipment used: Ambulation equipment used ?Transfers: Sit to/from Stand ?Sit to Stand: Min assist, From elevated surface ?  ?Step pivot transfers: Total assist ?  ?  ?  ?General transfer comment: Michaelyn Barter to go EOB to recliner. min assist  to power up and steady. ?  ? ?Ambulation/Gait ?  ?  ?  ?  ?  ?  ?  ?General Gait Details: transfers only with WBAT ? ? ?Stairs ?  ?  ?  ?  ?  ? ? ?Wheelchair Mobility ?  ? ?Modified Rankin (Stroke Patients Only) ?  ? ? ?  ?Balance Overall balance assessment: Needs assistance ?Sitting-balance support: Bilateral upper extremity supported ?Sitting balance-Leahy Scale: Good ?  ?  ?Standing balance support: Bilateral upper extremity supported, Reliant on assistive device for balance ?Standing balance-Leahy Scale: Zero ?Standing balance comment: Therapist assist as well as assistive device when standing. ?  ?  ?  ?  ?  ?  ?  ?  ?  ?  ?  ?  ? ?  ?Cognition Arousal/Alertness: Awake/alert ?Behavior During Therapy: [facility] for tasks assessed/performed ?Overall Cognitive Status: Within Functional Limits for tasks assessed ?  ?  ?  ?  ?  ?  ?  ?  ?  ?  ?  ?  ?  ?  ?  ?  ?  General Comments: able to move with extra time to accomplish the task and encouragement ?  ?  ? ?  ?Exercises General Exercises - Lower Extremity ?Ankle Circles/Pumps: AROM, Both, 10 reps ?Quad Sets: AROM, Supine, Both, 10 reps ?Heel Slides: AROM, Left, 5 reps, Supine ?Hip ABduction/ADduction: AROM, Left, 5 reps, Supine ?Straight Leg Raises: AROM,  Left, 5 reps, Supine ?Other Exercises ?Other Exercises: Encouraged AROM and some bed level trunk flexion exercises to do at her own pace between therapy sessions ? ?  ?General Comments General comments (skin integrity, edema, etc.): VSS with 3LO2 ?  ?  ? ?Pertinent Vitals/Pain Pain Assessment ?Pain Assessment: Faces ?Faces Pain Scale: Hurts little more ?Body Language: relaxed ?Consolability: no need to console ?Pain Location: abdomen and LLE during transfer/bed mobility ?Pain Descriptors / Indicators: Grimacing, Guarding ?Pain Intervention(s): Limited activity within patient's tolerance, Monitored during session, Repositioned  ? ? ?Home Living   ?  ?  ?  ?  ?  ?  ?  ?  ?  ?   ?  ?Prior Function    ?  ?  ?   ? ?PT Goals (current goals can now be found in the care plan section) Acute Rehab PT Goals ?Patient Stated Goal: return home ?Progress towards PT goals: Progressing toward goals ? ?  ?Frequency ? ? ? Min 3X/week ? ? ? ?  ?PT Plan Current plan remains appropriate  ? ? ?Co-evaluation   ?  ?  ?  ?  ? ?  ?AM-PAC PT "6 Clicks" Mobility   ?Outcome Measure ? Help needed turning from your back to your side while in a flat bed without using bedrails?: A Little ?Help needed moving from lying on your back to sitting on the side of a flat bed without using bedrails?: A Lot ?Help needed moving to and from a bed to a chair (including a wheelchair)?: A Lot ?Help needed standing up from a chair using your arms (e.g., wheelchair or bedside chair)?: A Lot ?Help needed to walk in hospital room?: Total ?Help needed climbing 3-5 steps with a railing? : Total ?6 Click Score: 11 ? ?  ?End of Session Equipment Utilized During Treatment: Gait belt ?Activity Tolerance: Patient limited by pain;Patient limited by fatigue ?Patient left: in chair;with call bell/phone within reach;with chair alarm set ?Nurse Communication: Mobility status;Weight bearing status (instructions for return to bed and WB on LLE) ?PT Visit Diagnosis: History of  falling (Z91.81);Difficulty in walking, not elsewhere classified (R26.2) ?  ? ? ?Time: 0347-4259 ?PT Time Calculation (min) (ACUTE ONLY): 24 min ? ?Charges:  $Therapeutic Exercise: 8-22 mins ?$Therapeutic Activity: 8-22 mins          ?          ? ?Layanna Charo M,PT ?Acute Rehab Services ?[phone] ?[phone] (pager)  ? ? ?Alvira Philips ?01/10/2022, 4:08 PM ? ?

## 2022-01-10 NOTE — Progress Notes (Signed)
Nutrition Follow-up ? ?DOCUMENTATION CODES:  ? ?Not applicable ? ?INTERVENTION:  ? ?Continue Multivitamin w/ minerals daily ?Continue Boost Breeze po BID, each supplement provides 250 kcal and 9 grams of protein ?Ensure Enlive po BID, each supplement provides 350 kcal and 20 grams of protein. ?Diet education ? ?NUTRITION DIAGNOSIS:  ? ?Inadequate oral intake related to inability to eat as evidenced by NPO status. - Progressing, pt now on a diet ? ?GOAL:  ? ?Patient will meet greater than or equal to 90% of their needs - Ongoing ? ?MONITOR:  ? ?PO intake, Supplement acceptance, Labs, Weight trends ? ?REASON FOR ASSESSMENT:  ? ?Malnutrition Screening Tool ?  ? ?ASSESSMENT:  ? ?76 y.o. female presented to the ED with abdominal pain, poor appetite, loose stools, and a fever. PMH includes COPD, HTN, CKD IIIb, GERD, and cancer. Pt admitted with diverticulitis w/ possible perforation.  ? ?3/21 - Ex-Lap w/ sigmoid colectomy and colostomy ?3/25 - diet advanced to SOFT ? ?Pt resting in bed, daughter at bedside.  ? ?Pt reports that her appetite was poor yesterday, but much better today. Pt reports that she prefers the [employer]. Discussed the importance of proper protein intake to help with wound healing. Pt agreeable to add Chocolate Ensure on.  ?Per EMR, pt ate 100% of lunch and 50% of dinner on 3/25. ? ?Provided pt and daughter with "Colostomy Nutrition Therapy" from the Academy of [employer] Dietetics. Reviewed different foods that can alter stool. Discussed to monitor for dehydration. Discussed the importance of slowly adding fiber back into diet.  ?Daughter and pt plan to review handout more. No other questions at this time. Encouraged to have RN reach out with any questions or concerns.  ? ?Medications reviewed and include: Remeron, MVI, Protonix, Prednisone ?Labs reviewed. ? ?Diet Order:   ?Diet Order   ? ?       ?  DIET SOFT Room service appropriate? Yes; Fluid consistency: Thin  Diet effective now       ?  ? ?   ?  ? ?  ? ? ?EDUCATION NEEDS:  ? ?Education needs have been addressed ? ?Skin:  Skin Assessment: Skin Integrity Issues: ?Skin Integrity Issues:: Stage II ?Stage II: Sacrum ? ?Last BM:  3/27  via colostomy ? ?Height:  ?Ht Readings from Last 1 Encounters:  ?01/04/22 5' (1.524 m)  ? ?Weight:  ?Wt Readings from Last 1 Encounters:  ?01/04/22 62.1 kg  ? ?Ideal Body Weight:  45.5 kg ? ?BMI:  Body mass index is 26.76 kg/m?. ? ?Estimated Nutritional Needs:  ? ?Kcal:  1800-2000 ? ?Protein:  90-105 grams ? ?Fluid:  >/= 1.8 L ? ? ? ?Hermina Barters RD, LDN ?Clinical Dietitian ?See AMiON for contact information.  ? ?

## 2022-01-10 NOTE — Consult Note (Signed)
[place] Nurse ostomy follow up ?Stoma type/location: LLQ colostomy ?Stomal assessment/size: 1 3/4" round pink well budded , moist  producing thick brown stool.  ?Peristomal assessment: creasing at 9 o'clock. Using barrier ring.  ?Treatment options for stomal/peristomal skin:  barrier ring and 2 piece pouch ?Output thick brown stool.  ?Ostomy pouching: 2pc. 2 3/4" pouch with barrier ring. Has bag of supplies in room for discharge  ?Education provided: Daughter not at bedside this AM.  Patient has been minimally participative in care so far. I would like her to increase independence with self care as she lives alone.  Daughter will be here tomorrow AM, but we will perform pouch change now.  It has been in place since 3/23 and patient is alert and willing to learn.  ?We remove pouch and cleanse skin. Patient able to cut barrier with cueing. Opening is cut large, but we are using barrier ring.  Patient assembles pouch and barrier and  rolls pouch closed.  We discuss twice weekly pouch changes and benefit of filtered pouch after discharge. She understands emptying when 1/3 full and demonstrates opening, emptying and cleaning bottom of pouch with toilet paper  She agrees to work with NT with emptying.  ?Enrolled patient in [place] Start Discharge program: Yes previously.  Has supplies for discharge.  ?Will follow up with daughter in AM. ?Domenic Moras MSN, RN, FNP-BC CWON ?Wound, Ostomy, Continence Nurse ?Pager [phone]  ?

## 2022-01-10 NOTE — TOC Progression Note (Signed)
Transition of Care (TOC) - Progression Note  ? ? ?Patient Details  ?Name: Holly [name] ?MRN: [id number] ?Date of Birth: 07-07-1946 ? ?Transition of Care (TOC) CM/SW Contact  ?Cyndi Bender, RN ?Phone Number: ?01/10/2022, 11:28 AM ? ?Clinical Narrative:    ?Corene Cornea with [facility] reached out leltting RNCM know this patient is active with them for [facility].  ?Updated [place] agency for  discharge. ? ? ? ?Expected Discharge Plan: [place] ?Barriers to Discharge: Continued Medical Work up ? ?Expected Discharge Plan and Services ?Expected Discharge Plan: [place] ?  ?  ?  ?Living arrangements for the past 2 months: [place] ?                ?  ?  ?  ?  ?  ?HH Arranged: PT, OT, Nurse's Aide ?[place] Agency: [place] ([place]) ?Date HH Agency Contacted: 01/10/22 ?Time [place]: 8811 ?Representative spoke with at [place]: Corene Cornea ? ? ?Social Determinants of Health (SDOH) Interventions ?  ? ?Readmission Risk Interventions ? ?  08/31/2021  ?  2:17 PM 07/28/2021  ?  1:48 PM 02/18/2021  ?  3:22 PM  ?Readmission Risk Prevention Plan  ?Transportation Screening Complete Complete Complete  ?PCP or Specialist Appt within 5-7 Days   Complete  ?Home Care Screening   Complete  ?Medication Review (RN CM)   Complete  ?Medication Review Press photographer) Complete Referral to Pharmacy   ?PCP or Specialist appointment within 3-5 days of discharge Complete Complete   ?[place] or Home Care Consult Complete Patient refused   ?SW Recovery Care/Counseling Consult Complete Complete   ?Palliative Care Screening Not Applicable Not Applicable   ?[place] Not Applicable Not Applicable   ? ? ?

## 2022-01-10 NOTE — NC FL2 (Signed)
?[place] MEDICAID FL2 LEVEL OF CARE SCREENING TOOL  ?  ? ?IDENTIFICATION  ?Patient Name: ?Holly [name] Birthdate: 07-03-1946 Sex: female Admission Date (Current Location): ?01/01/2022  ?[place] and [place] Number: ? [place] ?  Facility and Address:  ?[facility] [place], [place] [address] [place], [place] [address] ?     Provider Number: ?[id number]  ?Attending Physician Name and Address:  ?Elgergawy, Silver Huguenin, MD ? Relative Name and Phone Number:  ?  ?   ?Current Level of Care: ?Hospital Recommended Level of Care: ?[place] Prior Approval Number: ?  ? ?Date Approved/Denied: ?  PASRR Number: ?[id number] ? ?Discharge Plan: ?SNF ?  ? ?Current Diagnoses: ?Patient Active Problem List  ? Diagnosis Date Noted  ? Pressure ulcer 01/07/2022  ? Diverticulitis of intestine with perforation 01/01/2022  ? 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 OSA on CPAP 01/01/2022  ? Hypothyroidism 01/01/2022  ? MRSA infection 12/24/2021  ? Osteomyelitis ([place]) 12/24/2021  ? Hardware complicating wound infection ([place]) 12/24/2021  ? Closed fracture of left tibial plateau 12/13/2021  ? Malignant neoplasm of middle lobe of right lung ([place]) 09/06/2021  ? E coli bacteremia 08/29/2021  ? Sepsis secondary to UTI ([place]) 08/29/2021  ? Acute on chronic respiratory failure with hypoxia and hypercapnia ([place]) 08/28/2021  ? Anemia due to chronic kidney disease 08/28/2021  ? Acute metabolic encephalopathy 56/31/4970  ? GERD (gastroesophageal reflux disease) 08/28/2021  ? DNR (do not resuscitate) 08/28/2021  ? SIRS (systemic inflammatory response syndrome) ([place]) 08/28/2021  ? Acute respiratory failure with hypoxia and hypercapnia (HCC)   ? Septic shock ([place])   ? Goals of care, counseling/discussion   ? Acute on chronic respiratory failure ([place]) 07/23/2021  ? Hypotension 02/23/2021  ? Hypokalemia 02/23/2021  ? Acute renal failure superimposed on stage 3a chronic kidney disease ([place]) 02/22/2021  ? GI bleed  02/16/2021  ? ABLA (acute blood loss anemia) 02/16/2021  ? Diverticulitis 02/16/2021  ? Current chronic use of systemic steroids 02/16/2021  ? Pressure injury of skin 02/16/2021  ? Olecranon bursitis of right elbow 10/02/2020  ? Ulna, olecranon process fracture, right, closed, with routine healing, subsequent encounter 10/01/2020  ? Primary malignant neoplasm of left upper lobe of lung ([place]) 07/09/2019  ? Closed fracture of right olecranon process 12/04/2017  ? Cough 11/11/2015  ? Chronic respiratory failure with hypoxia ([place]) 11/11/2014  ? Cigarette smoker 10/16/2014  ? Multinodular thyroid 07/30/2014  ? 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 COPD exacerbation ([place]) 02/11/2013  ? Pedal edema 02/11/2013  ? COPD GOLD III  06/11/2010  ? 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 HYPERLIPIDEMIA, MIXED 05/13/2010  ? HYPERCALCEMIA 05/13/2010  ? Anxiety state 05/13/2010  ? Essential hypertension 05/13/2010  ? CERVICALGIA 05/13/2010  ? OSTEOPENIA 05/13/2010  ? URINARY INCONTINENCE 05/13/2010  ? 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 ? ?Orientation RESPIRATION BLADDER Height & Weight   ?  ?Self, Time, Situation, Place ? Normal Incontinent, External catheter Weight: 137 lb (62.1 kg) ?Height:  5' (152.4 cm)  ?BEHAVIORAL SYMPTOMS/MOOD NEUROLOGICAL BOWEL NUTRITION STATUS  ?    Continent, Colostomy Diet (See DC Summary)  ?AMBULATORY STATUS COMMUNICATION OF NEEDS Skin   ?Limited Assist Verbally PU Stage and Appropriate Care, Surgical wounds (Stage II on sacrum; closed incision on abdomen and knee) ?  ?  ?  ?    ?     ?     ? ? ?Personal  Care Assistance Level of Assistance  ?Bathing, Feeding, Dressing Bathing Assistance: Limited assistance ?Feeding assistance: Independent ?Dressing Assistance: Limited assistance ?   ? ?Functional Limitations Info  ?Sight, Hearing Sight Info: Impaired ?Hearing Info: Impaired ?   ? ? ?SPECIAL CARE FACTORS FREQUENCY  ?PT (By licensed PT), OT (By licensed OT)   ?   ?PT Frequency: 5x/week ?OT Frequency: 5x/week ?  ?  ?  ?   ? ? ?Contractures Contractures Info: Not present  ? ? ?Additional Factors Info  ?Code Status, Allergies, Isolation Precautions, Psychotropic Code Status Info: DNR ?Allergies Info: Amlodipine Besylate, Codeine, Oxycodone-acetaminophen ?Psychotropic Info: Wellbutrin; Buspar ?  ?Isolation Precautions Info: MRSA ?   ? ?Current Medications (01/10/2022):  This is the current hospital active medication list ?Current Facility-Administered Medications  ?Medication Dose Route Frequency Provider Last Rate Last Admin  ? 0.9 %  sodium chloride infusion (Manually program via Guardrails IV Fluids)   Intravenous Once Meuth, Brooke A, PA-C      ? 0.9 %  sodium chloride infusion (Manually program via Guardrails IV Fluids)   Intravenous Once Elgergawy, Silver Huguenin, MD      ? acetaminophen (TYLENOL) tablet 1,000 mg  1,000 mg Oral Q6H Meuth, Brooke A, PA-C   1,000 mg at 01/10/22 1119  ? albuterol (PROVENTIL) (2.5 MG/3ML) 0.083% nebulizer solution 2.5 mg  2.5 mg Nebulization Q6H PRN Meuth, Brooke A, PA-C      ? ALPRAZolam Duanne Moron) tablet 0.5 mg  0.5 mg Oral BID PRN Meuth, Brooke A, PA-C   0.5 mg at 01/10/22 3267  ? atorvastatin (LIPITOR) tablet 20 mg  20 mg Oral QHS Meuth, Brooke A, PA-C   20 mg at 01/09/22 2112  ? buPROPion (WELLBUTRIN XL) 24 hr tablet 300 mg  300 mg Oral q morning Meuth, Brooke A, PA-C   300 mg at 01/10/22 1245  ? busPIRone (BUSPAR) tablet 5 mg  5 mg Oral BID Meuth, Brooke A, PA-C   5 mg at 01/10/22 8099  ? chlorhexidine (PERIDEX) 0.12 % solution 15 mL  15 mL Mouth Rinse BID Meuth, Brooke A, PA-C   15 mL at 01/10/22 8338  ? Chlorhexidine Gluconate Cloth 2 % PADS 6 each  6 each Topical Daily Meuth, Brooke A, PA-C   6 each at 01/10/22 1127  ? DAPTOmycin (CUBICIN) 500 mg in sodium chloride 0.9 % IVPB  8 mg/kg Intravenous Q2000 Meuth, Brooke A, PA-C   Stopped at 01/09/22 2140  ? donepezil (ARICEPT) tablet 10 mg  10 mg Oral QHS Meuth, Brooke A, PA-C   10 mg at 01/09/22  2112  ? enoxaparin (LOVENOX) injection 40 mg  40 mg Subcutaneous Daily Meuth, Brooke A, PA-C   40 mg at 01/10/22 2505  ? feeding supplement (BOOST / RESOURCE BREEZE) liquid 1 Container  1 Container Oral TID BM Meuth, Brooke A, PA-C   1 Container at 01/09/22 2050  ? fluticasone furoate-vilanterol (BREO ELLIPTA) 100-25 MCG/ACT 1 puff  1 puff Inhalation Daily Meuth, Brooke A, PA-C   1 puff at 01/10/22 3976  ? And  ? umeclidinium bromide (INCRUSE ELLIPTA) 62.5 MCG/ACT 1 puff  1 puff Inhalation Daily Meuth, Brooke A, PA-C   1 puff at 01/10/22 7341  ? HYDROmorphone (DILAUDID) injection 0.5 mg  0.5 mg Intravenous Q6H PRN Elgergawy, Silver Huguenin, MD   0.5 mg at 01/10/22 1120  ? levothyroxine (SYNTHROID) tablet 137 mcg  137 mcg Oral Q0600 [name] Billet A, PA-C   137 mcg at 01/10/22 0505  ? MEDLINE mouth  rinse  15 mL Mouth Rinse q12n4p Meuth, Brooke A, PA-C   15 mL at 01/10/22 1120  ? methocarbamol (ROBAXIN) tablet 500 mg  500 mg Oral Q6H Meuth, Brooke A, PA-C   500 mg at 01/10/22 1015  ? metoprolol succinate (TOPROL-XL) 24 hr tablet 50 mg  50 mg Oral Daily Elgergawy, Silver Huguenin, MD   50 mg at 01/10/22 0836  ? mirtazapine (REMERON) tablet 30 mg  30 mg Oral QHS Meuth, Brooke A, PA-C   30 mg at 01/09/22 2112  ? multivitamin with minerals tablet 1 tablet  1 tablet Oral Daily Meuth, Brooke A, PA-C   1 tablet at 01/10/22 3329  ? ondansetron (ZOFRAN) injection 4 mg  4 mg Intravenous Q6H PRN Jonetta Osgood, MD   4 mg at 01/04/22 2001  ? ondansetron (ZOFRAN-ODT) disintegrating tablet 4 mg  4 mg Oral Q6H PRN Meuth, Brooke A, PA-C      ? oxyCODONE (Oxy IR/ROXICODONE) immediate release tablet 5-10 mg  5-10 mg Oral Q4H PRN Meuth, Brooke A, PA-C   10 mg at 01/10/22 1347  ? pantoprazole (PROTONIX) EC tablet 40 mg  40 mg Oral BID Meuth, Brooke A, PA-C   40 mg at 01/10/22 5188  ? predniSONE (DELTASONE) tablet 20 mg  20 mg Oral Daily Meuth, Brooke A, PA-C   20 mg at 01/10/22 4166  ? prochlorperazine (COMPAZINE) injection 10 mg  10 mg  Intravenous Q6H PRN Meuth, Brooke A, PA-C      ? sodium chloride flush (NS) 0.9 % injection 10-40 mL  10-40 mL Intracatheter PRN Meuth, Brooke A, PA-C   10 mL at 01/04/22 2058  ? sodium chloride flush (NS) 0.9 % injectio

## 2022-01-10 NOTE — Progress Notes (Addendum)
6 Days Post-Op  ? ?Subjective/Chief Complaint: ?Not eating much yesterday, but really likes her breeze drink.  Wanting to eat better today.  Legs gave away on her while trying to walk yesterday ? ? ?Objective: ?Vital signs in last 24 hours: ?Temp:  [96.2 ?F (35.7 ?C)-98.3 ?F (36.8 ?C)] 98.1 ?F (36.7 ?C) (03/27 0734) ?Pulse Rate:  [55-74] 58 (03/27 0734) ?Resp:  [16-19] 17 (03/27 0734) ?BP: (114-143)/(47-55) 128/53 (03/27 0734) ?SpO2:  [92 %-100 %] 92 % (03/27 0734) ?Last BM Date : 01/09/22 ? ?Intake/Output from previous day: ?03/26 0701 - 03/27 0700 ?In: -  ?Out: 99 [Urine:850; Drains:30] ?Intake/Output this shift: ?No intake/output data recorded. ? ?Gen:  Alert, NAD, pleasant ?Card:  RRR ?Pulm:  rate and effort normal on [place] ?Abd: soft, ND, mild lower abdominal TTP, midline c/d/I, JP with serosanguinous fluid, colostomy viable with no output as bag just changed ? ?Lab Results:  ?Recent Labs  ?  01/09/22 ?0022 01/10/22 ?0421  ?WBC 12.9* 12.0*  ?HGB 8.7* 7.8*  ?HCT 30.2* 26.8*  ?PLT 200 239  ? ?BMET ?Recent Labs  ?  01/09/22 ?0022 01/10/22 ?0421  ?NA 139 138  ?K 3.9 3.8  ?CL 106 104  ?CO2 28 30  ?GLUCOSE 117* 115*  ?BUN 12 12  ?CREATININE 0.65 0.59  ?CALCIUM 8.4* 8.3*  ? ?PT/INR ?No results for input(s): LABPROT, INR in the last 72 hours. ?ABG ?No results for input(s): PHART, HCO3 in the last 72 hours. ? ?Invalid input(s): PCO2, PO2 ? ?Studies/Results: ?No results found. ? ?Anti-infectives: ?Anti-infectives (From admission, onward)  ? ? Start     Dose/Rate Route Frequency Ordered Stop  ? 01/01/22 2000  DAPTOmycin (CUBICIN) injection 500 mg  Status:  Discontinued       ? 10 mL Intravenous Daily 01/01/22 1400 01/01/22 1425  ? 01/01/22 1515  DAPTOmycin (CUBICIN) 500 mg in sodium chloride 0.9 % IVPB       ? 8 mg/kg ? 62.1 kg ?120 mL/hr over 30 Minutes Intravenous Daily 01/01/22 1425    ? 01/01/22 1430  cefTRIAXone (ROCEPHIN) 2 g in sodium chloride 0.9 % 100 mL IVPB       ? 2 g ?200 mL/hr over 30 Minutes Intravenous Every  24 hours 01/01/22 1344 01/09/22 2359  ? 01/01/22 1430  metroNIDAZOLE (FLAGYL) IVPB 500 mg       ? 500 mg ?100 mL/hr over 60 Minutes Intravenous Every 12 hours 01/01/22 1344 01/09/22 1724  ? 01/01/22 1415  piperacillin-tazobactam (ZOSYN) IVPB 3.375 g  Status:  Discontinued       ? 3.375 g ?12.5 mL/hr over 240 Minutes Intravenous Every 8 hours 01/01/22 1329 01/01/22 1344  ? 01/01/22 1400  piperacillin-tazobactam (ZOSYN) IVPB 3.375 g  Status:  Discontinued       ? 3.375 g ?100 mL/hr over 30 Minutes Intravenous Every 8 hours 01/01/22 1309 01/01/22 1328  ? ?  ? ? ?Assessment/Plan: ?POD 6, s/p exploratory laparotomy with sigmoid colectomy and colostomy on 01/04/22 with Dr. Thermon Leyland for complicated diverticulitis with multiple abscesses not amenable to IR drainage who failed antibiotic treatment  ?- surgical path benign - perforated diverticulitis ?- Soft diet. Boost breeze ?- continue JP drain and monitor output (sitting in pelvis in location of abscesses); remove prior to discharge ?- WOC following for new colostomy ?- continue PT/OT - recommending home health at family request but she and I discussed SNF today if she is as weak and she states she is. ?- Continue penrose drain in midline, remove  on day of discharge ?- WBC still 12K, but also on 20 of prednisone so not sure if this is up secondary to steroids or post op. ?- Patient progressing towards discharge from surgical standpoint ?  ?FEN - soft, Boost ?VTE - Lovenox ?ID - cubicin, for infected hardware ?Foley - removed ?  ?Acute on chronic anemia - hgb 8.4 today stable. 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 LOS: 9 days  ? ? ?Henreitta Cea ?01/10/2022 ? ?

## 2022-01-10 NOTE — Progress Notes (Signed)
Occupational Therapy Treatment ?Patient Details ?Name: Holly [name] ?MRN: [id number] ?DOB: Aug 28, 1946 ?Today's Date: 01/10/2022 ? ? ?History of present illness Pt is a 76 y/o female presenting on 3/18 from [facility] for abdominal pain.  Found with sigmoid diverticulitis with contained perforation and abscess formation. 3/21 SIGMOID COLECTOMY AND COLOSTOMY    PMH includes:  L tibial plateau ORIF on 2/27 (osteomyelitis). ?malignancy; anemia, basal cell carcinoma/lung cancer, COPD on 3L O2, HTN, PNA, CKD. ?  ?OT comments ? Pt progressing slow but steady towards acute OT goals. Michaelyn Barter for working on functional transfers this session. Pt needed mod A +2 for safety for sit<>stands to/from recliner and EOB. BLE weakness noted, poor standing tolerance. Mod A for bed mobility. D/c recommendation updated to [facility] for continued rehab prior to returning home.   ? ?Recommendations for follow up therapy are one component of a multi-disciplinary discharge planning process, led by the attending physician.  Recommendations may be updated based on patient status, additional functional criteria and insurance authorization. ?   ?Follow Up Recommendations ? Skilled nursing-short term rehab (<3 hours/day)  ?  ?Assistance Recommended at Discharge Frequent or constant Supervision/Assistance  ?Patient can return home with the following ? A lot of help with walking and/or transfers;A lot of help with bathing/dressing/bathroom;Assistance with cooking/housework;Direct supervision/assist for medications management;Assist for transportation ?  ?Equipment Recommendations ? BSC/3in1  ?  ?Recommendations for Other Services   ? ?  ?Precautions / Restrictions Precautions ?Precautions: Fall ?Precaution Comments: colostomy; rt quadrant JP drain;  recent left tibia fx ?Restrictions ?Weight Bearing Restrictions: Yes ?LLE Weight Bearing: Weight bearing as tolerated ?Other Position/Activity Restrictions: WBAT for transfers only  ? ? ?   ? ?Mobility Bed Mobility ?Overal bed mobility: Needs Assistance ?  ?  ?  ?  ?Sit to supine: Mod assist ?  ?General bed mobility comments: assist to advance BLE. Painful to touch, extra time needed. ?  ? ?Transfers ?Overall transfer level: Needs assistance ?Equipment used: Ambulation equipment used ?Transfers: Sit to/from Stand ?Sit to Stand: Mod assist, +2 safety/equipment ?  ?  ?  ?  ?  ?General transfer comment: Michaelyn Barter to go from recliner to EOB. mod A +2 to powerup and steady. Success on second attempt to stand ?  ?  ?Balance Overall balance assessment: Needs assistance ?Sitting-balance support: Bilateral upper extremity supported ?Sitting balance-Leahy Scale: Good ?  ?  ?Standing balance support: Bilateral upper extremity supported, Reliant on assistive device for balance ?Standing balance-Leahy Scale: Zero ?  ?  ?  ?  ?  ?  ?  ?  ?  ?  ?  ?  ?   ? ?ADL either performed or assessed with clinical judgement  ? ?ADL Overall ADL's : Needs assistance/impaired ?  ?  ?  ?  ?  ?  ?  ?  ?  ?  ?  ?  ?Toilet Transfer: Moderate assistance;+2 for safety/equipment ?Toilet Transfer Details (indicate cue type and reason): utilized [employer]. success on second attempt to stand. Pt pulling up on Stedy bar to powerup. Limited standing tolerance. BLE weakness noted ?  ?  ?  ?  ?  ?General ADL Comments: Pt completed 2/3 successful sit<>stands utilizing Stedy frame. Used BUE across/on bar to power up. BLE buckling noted but able to maintain upright position long enough to adjust seat. ?  ? ?Extremity/Trunk Assessment Upper Extremity Assessment ?Upper Extremity Assessment: Generalized weakness ?  ?Lower Extremity Assessment ?Lower Extremity Assessment: Defer to PT evaluation ?  ?  ?  ? ?  Vision   ?  ?  ?Perception   ?  ?Praxis   ?  ? ?Cognition Arousal/Alertness: Awake/alert ?Behavior During Therapy: [employer] for tasks assessed/performed ?Overall Cognitive Status: Within Functional Limits for tasks assessed ?  ?  ?  ?  ?  ?  ?  ?  ?  ?   ?  ?  ?  ?  ?  ?  ?  ?  ?  ?   ?Exercises Other Exercises ?Other Exercises: Encouraged AROM and some bed level trunk flexion exercises to do at her own pace between therapy sessions ? ?  ?Shoulder Instructions   ? ? ?  ?General Comments on 3L O2 throughout session  ? ? ?Pertinent Vitals/ Pain       Pain Assessment ?Pain Assessment: Faces ?Faces Pain Scale: Hurts even more ?Pain Location: abdomen and LLE during transfer/bed mobility ?Pain Descriptors / Indicators: Grimacing, Guarding ?Pain Intervention(s): Monitored during session, Limited activity within patient's tolerance, Patient requesting pain meds-RN notified, Repositioned ? ?Home Living   ?  ?  ?  ?  ?  ?  ?  ?  ?  ?  ?  ?  ?  ?  ?  ?  ?  ?  ? ?  ?Prior Functioning/Environment    ?  ?  ?  ?   ? ?Frequency ? Min 2X/week  ? ? ? ? ?  ?Progress Toward Goals ? ?OT Goals(current goals can now be found in the care plan section) ? Progress towards OT goals: Progressing toward goals ? ?Acute Rehab OT Goals ?Patient Stated Goal: considering d/c to SNF for ST rehab stay prior to returning home ?OT Goal Formulation: With patient ?Time For Goal Achievement: 01/18/22 ?Potential to Achieve Goals: Good ?ADL Goals ?Pt Will Perform Grooming: with modified independence;sitting ?Pt Will Perform Upper Body Dressing: with supervision;sitting ?Pt Will Perform Lower Body Dressing: with mod assist;sit to/from stand;sitting/lateral leans ?Pt Will Transfer to Toilet: bedside commode;squat pivot transfer;with min assist ?Pt Will Perform Tub/Shower Transfer: Shower transfer;ambulating;shower seat;rolling walker;Stand pivot transfer;with min guard assist ?Additional ADL Goal #1: Pt will complete bed mobility with min assist as precursor to ADLs.  ?Plan Discharge plan needs to be updated   ? ?Co-evaluation ? ? ?   ?  ?  ?  ?  ? ?  ?AM-PAC OT "6 Clicks" Daily Activity     ?Outcome Measure ? ? Help from another person eating meals?: None ?Help from another person taking care of personal  grooming?: A Little ?Help from another person toileting, which includes using toliet, bedpan, or urinal?: A Lot ?Help from another person bathing (including washing, rinsing, drying)?: A Lot ?Help from another person to put on and taking off regular upper body clothing?: A Little ?Help from another person to put on and taking off regular lower body clothing?: A Lot ?6 Click Score: 16 ? ?  ?End of Session Equipment Utilized During Treatment: Oxygen;Other (comment) Charlaine Dalton) ? ?OT Visit Diagnosis: Pain;Muscle weakness (generalized) (M62.81) ?  ?Activity Tolerance Patient limited by pain;Patient limited by fatigue ?  ?Patient Left in bed;with call bell/phone within reach;with nursing/sitter in room ?  ?Nurse Communication Other (comment) (Nurse present througout session) ?  ? ?   ? ?Time: 1251-1319 ?OT Time Calculation (min): 28 min ? ?Charges: OT General Charges ?$OT Visit: 1 Visit ?OT Treatments ?$Self Care/Home Management : 23-37 mins ? ?Tyrone Schimke, OT ?Acute Rehabilitation Services ?Office: [phone] ? ? ?Tyrone Schimke H ?01/10/2022, 1:35 PM ?

## 2022-01-11 ENCOUNTER — Inpatient Hospital Stay (HOSPITAL_COMMUNITY): Payer: Medicare Other

## 2022-01-11 DIAGNOSIS — L538 Other specified erythematous conditions: Secondary | ICD-10-CM

## 2022-01-11 DIAGNOSIS — R609 Edema, unspecified: Secondary | ICD-10-CM | POA: Diagnosis not present

## 2022-01-11 DIAGNOSIS — I82A11 Acute embolism and thrombosis of right axillary vein: Secondary | ICD-10-CM

## 2022-01-11 DIAGNOSIS — K5720 Diverticulitis of large intestine with perforation and abscess without bleeding: Secondary | ICD-10-CM | POA: Diagnosis not present

## 2022-01-11 DIAGNOSIS — M869 Osteomyelitis, unspecified: Secondary | ICD-10-CM | POA: Diagnosis not present

## 2022-01-11 DIAGNOSIS — J9611 Chronic respiratory failure with hypoxia: Secondary | ICD-10-CM | POA: Diagnosis not present

## 2022-01-11 LAB — CBC
HCT: 29.8 % — ABNORMAL LOW (ref 36.0–46.0)
Hemoglobin: 9.1 g/dL — ABNORMAL LOW (ref 12.0–15.0)
MCH: 28.3 pg (ref 26.0–34.0)
MCHC: 30.5 g/dL (ref 30.0–36.0)
MCV: 92.5 fL (ref 80.0–100.0)
Platelets: 233 10*3/uL (ref 150–400)
RBC: 3.22 MIL/uL — ABNORMAL LOW (ref 3.87–5.11)
RDW: 19.9 % — ABNORMAL HIGH (ref 11.5–15.5)
WBC: 12.5 10*3/uL — ABNORMAL HIGH (ref 4.0–10.5)
nRBC: 0 % (ref 0.0–0.2)

## 2022-01-11 LAB — FUNGUS CULTURE WITH STAIN

## 2022-01-11 LAB — BASIC METABOLIC PANEL
Anion gap: 5 (ref 5–15)
BUN: 12 mg/dL (ref 8–23)
CO2: 32 mmol/L (ref 22–32)
Calcium: 8.3 mg/dL — ABNORMAL LOW (ref 8.9–10.3)
Chloride: 102 mmol/L (ref 98–111)
Creatinine, Ser: 0.74 mg/dL (ref 0.44–1.00)
GFR, Estimated: >60 mL/min (ref >60)
Glucose, Bld: 138 mg/dL — ABNORMAL HIGH (ref 70–99)
Potassium: 3.5 mmol/L (ref 3.5–5.1)
Sodium: 139 mmol/L (ref 135–145)

## 2022-01-11 LAB — TYPE AND SCREEN
ABO/RH(D): O POS
Antibody Screen: NEGATIVE
Unit division: 0

## 2022-01-11 LAB — BPAM RBC
Blood Product Expiration Date: 202304252359
ISSUE DATE / TIME: 202303271742
Unit Type and Rh: 5100

## 2022-01-11 LAB — FUNGAL ORGANISM REFLEX

## 2022-01-11 LAB — FUNGUS CULTURE RESULT

## 2022-01-11 MED ORDER — ENOXAPARIN SODIUM 60 MG/0.6ML IJ SOSY
60.0000 mg | PREFILLED_SYRINGE | Freq: Two times a day (BID) | INTRAMUSCULAR | Status: DC
  Administered 2022-01-11 – 2022-01-12 (×2): 60 mg via SUBCUTANEOUS
  Filled 2022-01-11 (×2): qty 0.6

## 2022-01-11 NOTE — Progress Notes (Signed)
Called to assess RUA PICC site. C/O pain at site. Mild edema with RUA circumference of 26cm compared to 24cm on LUA at same level. Quintin Alto RN to contact physician for further orders.  ?

## 2022-01-11 NOTE — Progress Notes (Signed)
Right upper extremity venous duplex completed. ?Refer to "CV Proc" under chart review to view preliminary results. ? ?01/11/2022 1:28 PM ?Kelby Aline., MHA, RVT, RDCS, RDMS   ?

## 2022-01-11 NOTE — Progress Notes (Signed)
?   01/11/22 1000  ? PT - Assessment/Plan  ?Follow Up Recommendations Acute inpatient rehab (3hours/day)  ?Assistance recommended at discharge Intermittent Supervision/Assistance  ? ?Notified by CM/SW/REhab coordinator regarding if pt is appropriate for AIR.  Feel that pt would benefit.  Pt not on PT caseload today. Will continue PT at later date.   ?Sabreena Vogan M,PT ?Acute Rehab Services ?[phone] ?[phone] (pager)  ?

## 2022-01-11 NOTE — Progress Notes (Signed)
Pt c/o right arm pain. Right anterior arm is red and edematous. Pt has r.arm pic , IV team consulted to evaluate. Provider C. Hall notified and aware . Will continue to monitor and await new orders. Call bell within reach , pt has no additional concerns at this time.  ?

## 2022-01-11 NOTE — Progress Notes (Signed)
Inpatient Rehab Admissions Coordinator:  ? ?Pt was screened for CIR by Shann Medal, PT, DPT.  Discussed with PT/OT.  Feel this patient could be appropriate for CIR.  I will place a consult per our protocol and an [facility] will follow up for full assessment.   ? ?Shann Medal, PT, DPT ?Admissions Coordinator ?[phone] ?01/11/22  ?10:03 AM ? ?

## 2022-01-11 NOTE — Progress Notes (Signed)
No new orders regarding right arm pain/ swelling Will continue to monitor. Pt resting comfortably.  ?

## 2022-01-11 NOTE — Progress Notes (Signed)
?      ?                 PROGRESS NOTE ? ?      ?PATIENT DETAILS ?Name: Holly [name] ?Age: 76 y.o. ?Sex: female ?Date of Birth: 10/14/1946 ?Admit Date: 01/01/2022 ?Admitting Physician Norval Morton, MD ?XBM:[id number], [name], MD ? ?Brief Summary: ? ?Patient is a 76 y.o.  female with history of HTN, HLD, COPD on 3 L of oxygen at home, lung CA, recent left tibial plateau pathological fracture-found to have MRSA on cultures-on IV daptomycin as outpatient -presented to [facility] for abdominal pain-upon further evaluation she was found to have perforated sigmoid diverticulitis with abscess formation.  She was then transferred to [facility] for further evaluation and treatment.  See below for further details. ? ?Significant events: ?2/27>> ORIF/biopsy of left tibial plateau fracture (biopsy negative for malignancy-culture positive for MRSA) ?3/18>> transfer from [facility]-sigmoid diverticulitis with perforation/abscess formation ?3/20>> per IR-no good target for drain placement at this time. ?3/21>> worsening abdominal pain s/p sigmoid colectomy with end colostomy on 01/04/22 ?3/27>> 1 unit PRBC transfusion for hemoglobin of 7.8. ?3/28>> right upper extremity venous Doppler significant for axillary DVT ? ?Significant studies: ?3/17>> CT abdomen (done at [facility]):-diffuse sigmoid diverticulosis with suggestion of acute complicated diverticulitis.  5.4 x 5.6 cm gas/fluid collection in her pelvis.  Inseparable from sigmoid and posterior wall of urinary bladder.  5.6 x 3.5 cm perisigmoid collection ?3/19>> CT abdomen/pelvis: Similar findings in the pelvis compared to prior CT with acute perforated sigmoid diverticulitis. ? ?Significant microbiology data: ?2/27>> culture left proximal tibia: Rare MRSA ? ?Procedures: ?2/27>> ORIF/biopsy of left tibial fracture (Dr. Doreatha Martin) ? ?Consults: ?General surgery, ID, IR, Ortho ? ?Subjective: ? ?No nausea, no vomiting, no significant events  overnight. ? ? ?Objective: ?Vitals: ?Blood pressure (!) 131/55, pulse 68, temperature 98.2 ?F (36.8 ?C), temperature source Oral, resp. rate (!) 22, height 5' (1.524 m), weight 62.1 kg, SpO2 99 %.  ? ?Exam: ? ?Awake Alert, Oriented X 3, No new F.N deficits, Normal affect ?Symmetrical Chest wall movement, Good air movement bilaterally, CTAB ?RRR,No Gallops,Rubs or new Murmurs, No Parasternal Heave ?+ve B.Sounds, JP drain present, colostomy with stool in the bag, midline surgical wound covered ?No Cyanosis, Clubbing or edema, No new Rash or bruise   ? ? ? ? ? ? ? ?Pertinent Labs/Radiology: ? ?  Latest Ref Rng & Units 01/11/2022  ?  1:45 AM 01/10/2022  ?  4:21 AM 01/09/2022  ? 12:22 AM  ?CBC  ?WBC 4.0 - 10.5 K/uL 12.5   12.0   12.9    ?Hemoglobin 12.0 - 15.0 g/dL 9.1   7.8   8.7    ?Hematocrit 36.0 - 46.0 % 29.8   26.8   30.2    ?Platelets 150 - 400 K/uL 233   239   200    ?  ?Lab Results  ?Component Value Date  ? NA 139 01/11/2022  ? K 3.5 01/11/2022  ? CL 102 01/11/2022  ? CO2 32 01/11/2022  ?  ? ? ?Assessment/Plan: ? ?Perforated sigmoid diverticulitis with pelvic abscess: ?-Failed conservative management, especially no good access for IR to drain abscess.   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 ?-Treat with IV Rocephin and Flagyl, antibiotics stopped 3/26. ?-WOC consulted regarding colostomy ?-Monitor JP output ?-Management per general surgery, no nausea, no vomiting,  she is tolerating her soft diet.  Has good output at colostomy.   ?-Leukocytosis continues to trend down which is reassuring. ? ? 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  Follows with Dr. Tommy Medal in the outpatient setting.  Appointment scheduled for 4/5 ?-Cruciate follow-up by orthopedic Dr. Doreatha Martin >> weightbearing:  Advance to WB for  transfers only  LLE.  No walker ambulation ? ?Acute blood loss anemia ?-Has anemia of chronic disease at baseline-worsening anemia is due to acute illness.  No evidence of blood loss.   CBC stable-s/p 1 unit of PRBC on 3/20.  ?-Did require another 1 unit PRBC transfusion intraoperatively. ?-Another unit 1 unit PRBC transfusion 3/27 with good hemoglobin response ? ?Right upper extremity acute DVT ?-Secondary to PICC line, will start on Lovenox full dose today, DC PICC line, transition to Eliquis and couple days if hemoglobin remained stable. ? ?Hypophosphatemia ?-Repleted ? ?AKI on CKD stage IIIb: creatinine is up to 1.37 today, continue with IV fluids ? ?Hypothyroidism: Continue Synthroid ? ?GERD: Continue PPI ? 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  ? ?History of lung cancer: Follow with medical oncology/radiation oncology-currently stable. ? ?OSA: Continue CPAP nightly ? ?Anxiety: Relatively stable-continue BuSpar, Wellbutrin and as needed Xanax. ? ?Pressure ulcer:  ?Continue with wound care ?Pressure Injury 01/06/22 Sacrum Stage 2 -  Partial thickness loss of dermis presenting as a shallow open injury with a red, pink wound bed without slough. (Active)  ?01/06/22 1603  ?Location: Sacrum  ?Location Orientation:   ?Staging: Stage 2 -  Partial thickness loss of dermis presenting as a shallow open injury with a red, pink wound bed without slough.  ?Wound Description (Comments):   ?Present on Admission:   ? ?  ? ?BMI: ?Estimated body mass index is 26.76 kg/m? as calculated from the following: ?  Height as of this encounter: 5' (1.524 m). ?  Weight as of this encounter: 62.1 kg.  ? ?Code status: ?  Code Status: DNR  ? ?DVT Prophylaxis: ?enoxaparin (LOVENOX) injection 40 mg Start: 01/05/22 1000 ?Place and maintain sequential compression device Start: 01/01/22 1353 ?  ?Family Communication: Daughter at bedside ? ? ?Disposition  Plan: ?Status is: Inpatient ?Remains inpatient appropriate because: Pelvic abscess-perforated sigmoid diverticulitis-no response to conservative measures-plans are for exploratory laparotomy with colectomy/colostomy placement. ?  ?Planned Discharge Destination: Patient is being evaluated for CIR ? ? ?Diet: ?Diet Order   ? ?       ?  DIET SOFT Room service appropriate? Yes; Fluid consistency: Thin  Diet effective now       ?  ? ?  ?  ? ?  ?  ? ? ?Antimicrobial agents: ?Anti-infectives (From admission, onward)  ? ? Start     Dose/Rate Route Frequency Ordered Stop  ? 01/01/22 2000  DAPTOmycin (CUBICIN) injection 500 mg  Status:  Discontinued       ? 10 mL Intravenous Daily 01/01/22 1400 01/01/22 1425  ? 01/01/22 1515  DAPTOmycin (CUBICIN) 500 mg in sodium chloride 0.9 % IVPB       ? 8 mg/kg ? 62.1 kg ?120 mL/hr over 30 Minutes Intravenous Daily 01/01/22 1425    ? 01/01/22 1430  cefTRIAXone (ROCEPHIN) 2 g in sodium chloride 0.9 % 100 mL IVPB       ? 2 g ?200 mL/hr over 30 Minutes Intravenous Every 24 hours 01/01/22  1344 01/09/22 2359  ? 01/01/22 1430  metroNIDAZOLE (FLAGYL) IVPB 500 mg       ? 500 mg ?100 mL/hr over 60 Minutes Intravenous Every 12 hours 01/01/22 1344 01/09/22 1724  ? 01/01/22 1415  piperacillin-tazobactam (ZOSYN) IVPB 3.375 g  Status:  Discontinued       ? 3.375 g ?12.5 mL/hr over 240 Minutes Intravenous Every 8 hours 01/01/22 1329 01/01/22 1344  ? 01/01/22 1400  piperacillin-tazobactam (ZOSYN) IVPB 3.375 g  Status:  Discontinued       ? 3.375 g ?100 mL/hr over 30 Minutes Intravenous Every 8 hours 01/01/22 1309 01/01/22 1328  ? ?  ? ? ? ?MEDICATIONS: ?Scheduled Meds: ? sodium chloride   Intravenous Once  ? acetaminophen  1,000 mg Oral Q6H  ? atorvastatin  20 mg Oral QHS  ? buPROPion  300 mg Oral q morning  ? busPIRone  5 mg Oral BID  ? chlorhexidine  15 mL Mouth Rinse BID  ? Chlorhexidine Gluconate Cloth  6 each Topical Daily  ? donepezil  10 mg Oral QHS  ? enoxaparin (LOVENOX) injection  40 mg  Subcutaneous Daily  ? feeding supplement  1 Container Oral BID BM  ? feeding supplement  237 mL Oral BID BM  ? fluticasone furoate-vilanterol  1 puff Inhalation Daily  ? And  ? umeclidinium bromide  1 puff Inhalation Daily  ? levothyroxine  137 mcg O

## 2022-01-11 NOTE — TOC Progression Note (Signed)
Transition of Care (TOC) - Progression Note  ? ? ?Patient Details  ?Name: Holly [name] ?MRN: [id number] ?Date of Birth: 22-May-1946 ? ?Transition of Care (TOC) CM/SW Contact  ?Benard Halsted, LCSW ?Phone Number: ?01/11/2022, 12:28 PM ? ?Clinical Narrative:    ?CSW provided SNF bed offers and Medicare ratings to patient and daughter at bedside. They report being most hopeful for CIR since [facility] is unable to offer a bed. CSW will follow up.  ? ? ?Expected Discharge Plan: [place] ?Barriers to Discharge: Continued Medical Work up ? ?Expected Discharge Plan and Services ?Expected Discharge Plan: [place] ?  ?  ?  ?Living arrangements for the past 2 months: [place] ?                ?  ?  ?  ?  ?  ?HH Arranged: PT, OT, Nurse's Aide ?[place] Agency: [facility] ([place]) ?Date HH Agency Contacted: 01/10/22 ?Time [place]: [phone] ?Representative spoke with at [place]: Corene Cornea ? ? ?Social Determinants of Health (SDOH) Interventions ?  ? ?Readmission Risk Interventions ? ?  08/31/2021  ?  2:17 PM 07/28/2021  ?  1:48 PM 02/18/2021  ?  3:22 PM  ?Readmission Risk Prevention Plan  ?Transportation Screening Complete Complete Complete  ?PCP or Specialist Appt within 5-7 Days   Complete  ?Home Care Screening   Complete  ?Medication Review (RN CM)   Complete  ?Medication Review Press photographer) Complete Referral to Pharmacy   ?PCP or Specialist appointment within 3-5 days of discharge Complete Complete   ?[place] or Home Care Consult Complete Patient refused   ?SW Recovery Care/Counseling Consult Complete Complete   ?Palliative Care Screening Not Applicable Not Applicable   ?[place] Not Applicable Not Applicable   ? ? ?

## 2022-01-11 NOTE — Consult Note (Addendum)
[place] Nurse ostomy follow up ?Stoma type/location: LLQ colostomy ?Stomal assessment/size: 1 3/4 inches, above skin level, 50cc semiformed brown stool.  ?Treatment options for stomal/peristomal skin:  barrier ring and 2 piece pouch ?Ostomy pouching: 2pc. 2 3/4" pouch  (wafer Kellie Simmering # 2, pouch Lawson # [phone], barrier ring Lawson # [id number]). Has 4 sets of supplies in room for discharge  ?Education provided: Daughter was able to perform all steps independently to apply pouch and open and close to empty.  Pt and daughter ask appropriate questions and she plans to discharge to a SNF. ?Reviewed pouching routines and ordering supplies.  ?Enrolled patient in [place] Start Discharge program: Yes previously.  ?Julien Girt MSN, RN, [place], [place], CNS ?[phone]  ?  ?  ?  ?  ? ? ? ?

## 2022-01-11 NOTE — Progress Notes (Signed)
Placed pt on CPAP for night rest.  Tolerating well. ?

## 2022-01-11 NOTE — Plan of Care (Signed)

## 2022-01-11 NOTE — Progress Notes (Addendum)
Right upper extremity edema, erythema and pain.  Right upper extremity duplex ultrasound ordered to rule out DVT.  Patient is on DVT prophylaxis subcu Lovenox and on IV antibiotics daptomycin.  We will continue to closely monitor and treat as indicated. ?

## 2022-01-11 NOTE — Progress Notes (Signed)
7 Days Post-Op  ? ?Subjective/Chief Complaint: ?Ate much better yesterday.  No new complaints today. ? ? ?Objective: ?Vital signs in last 24 hours: ?Temp:  [97.3 ?F (36.3 ?C)-99 ?F (37.2 ?C)] 97.9 ?F (36.6 ?C) (03/28 0746) ?Pulse Rate:  [60-75] 60 (03/28 0746) ?Resp:  [17-23] 17 (03/28 0746) ?BP: (109-146)/(47-67) 117/67 (03/28 0746) ?SpO2:  [96 %-100 %] 100 % (03/28 0746) ?Last BM Date : 01/10/22 ? ?Intake/Output from previous day: ?03/27 0701 - 03/28 0700 ?In: 6222 [P.O.:720; Blood:356; IV Piggyback:300] ?Out: 9798 [Urine:1500; Drains:105] ?Intake/Output this shift: ?Total I/O ?In: -  ?Out: 950 [Urine:900; Stool:50] ? ?Gen:  Alert, NAD, pleasant ?Abd: soft, ND, mild lower abdominal TTP, midline c/d/I, penrose removed as well as JP. colostomy viable with no output as bag just changed ? ?Lab Results:  ?Recent Labs  ?  01/10/22 ?0421 01/11/22 ?0145  ?WBC 12.0* 12.5*  ?HGB 7.8* 9.1*  ?HCT 26.8* 29.8*  ?PLT 239 233  ? ?BMET ?Recent Labs  ?  01/10/22 ?0421 01/11/22 ?0145  ?NA 138 139  ?K 3.8 3.5  ?CL 104 102  ?CO2 30 32  ?GLUCOSE 115* 138*  ?BUN 12 12  ?CREATININE 0.59 0.74  ?CALCIUM 8.3* 8.3*  ? ?PT/INR ?No results for input(s): LABPROT, INR in the last 72 hours. ?ABG ?No results for input(s): PHART, HCO3 in the last 72 hours. ? ?Invalid input(s): PCO2, PO2 ? ?Studies/Results: ?No results found. ? ?Anti-infectives: ?Anti-infectives (From admission, onward)  ? ? Start     Dose/Rate Route Frequency Ordered Stop  ? 01/01/22 2000  DAPTOmycin (CUBICIN) injection 500 mg  Status:  Discontinued       ? 10 mL Intravenous Daily 01/01/22 1400 01/01/22 1425  ? 01/01/22 1515  DAPTOmycin (CUBICIN) 500 mg in sodium chloride 0.9 % IVPB       ? 8 mg/kg ? 62.1 kg ?120 mL/hr over 30 Minutes Intravenous Daily 01/01/22 1425    ? 01/01/22 1430  cefTRIAXone (ROCEPHIN) 2 g in sodium chloride 0.9 % 100 mL IVPB       ? 2 g ?200 mL/hr over 30 Minutes Intravenous Every 24 hours 01/01/22 1344 01/09/22 2359  ? 01/01/22 1430  metroNIDAZOLE (FLAGYL)  IVPB 500 mg       ? 500 mg ?100 mL/hr over 60 Minutes Intravenous Every 12 hours 01/01/22 1344 01/09/22 1724  ? 01/01/22 1415  piperacillin-tazobactam (ZOSYN) IVPB 3.375 g  Status:  Discontinued       ? 3.375 g ?12.5 mL/hr over 240 Minutes Intravenous Every 8 hours 01/01/22 1329 01/01/22 1344  ? 01/01/22 1400  piperacillin-tazobactam (ZOSYN) IVPB 3.375 g  Status:  Discontinued       ? 3.375 g ?100 mL/hr over 30 Minutes Intravenous Every 8 hours 01/01/22 1309 01/01/22 1328  ? ?  ? ? ?Assessment/Plan: ?POD 7, s/p exploratory laparotomy with sigmoid colectomy and colostomy on 01/04/22 with Dr. Thermon Leyland for complicated diverticulitis with multiple abscesses not amenable to IR drainage who failed antibiotic treatment  ?- surgical path benign - perforated diverticulitis ?- Soft diet. Boost breeze ?- JP and penrose drains removed today ?- WOC following for new colostomy ?- continue PT/OT - recommending SNF.  Places pending for selection ?- WBC still 12K, but also on 20mg  of prednisone so not sure if this is up secondary to steroids or post op. ?- Patient is surgically stable for DC when able medically and has dispo ?-has follow up arranged already and will have SNF remove staples as put in DC instructions ?  ?  FEN - soft, Boost ?VTE - Lovenox ?ID - cubicin, for infected hardware ?Foley - removed ?  ?Acute on chronic anemia - hgb 9.1 ?L knee osteomyelitis due to MRSA ?AKI on CKD-IIIb - Cr normal ?Hypothyroidism ?HX of lung cancer ?COPD with chronic hypoxic respiratory failure on 3L O2 and chronic prednisone 20mg  qd ?OSA ?Code status DNR ? LOS: 10 days  ? ? ?Holly [name] ?01/11/2022 ? ?

## 2022-01-11 NOTE — Progress Notes (Signed)
ANTICOAGULATION CONSULT NOTE ? ?Pharmacy Consult for enoxaparin ?Indication: DVT ? ?Allergies  ?Allergen Reactions  ? Amlodipine Besylate Swelling  ?  Edema  ? Codeine Nausea And Vomiting  ? Oxycodone-Acetaminophen Nausea And Vomiting  ? ? ?Patient Measurements: ?Height: 5' (152.4 cm) ?Weight: 62.1 kg (137 lb) ?IBW/kg (Calculated) : 45.5 ? ?Vital Signs: ?Temp: 98.3 ?F (36.8 ?C) (03/28 1528) ?Temp Source: Oral (03/28 1528) ?BP: 138/62 (03/28 1528) ?Pulse Rate: 71 (03/28 1528) ? ?Labs: ?Recent Labs  ?  01/09/22 ?0022 01/10/22 ?0421 01/11/22 ?0145  ?HGB 8.7* 7.8* 9.1*  ?HCT 30.2* 26.8* 29.8*  ?PLT 200 239 233  ?CREATININE 0.65 0.59 0.74  ?CKTOTAL 30*  --   --   ? ? ?Estimated Creatinine Clearance: 48.4 mL/min (by C-G formula based on SCr of 0.74 mg/dL). ? ? ?Medical History: ?Past Medical History:  ?Diagnosis Date  ? Anemia   ? blood transfusion November 2022  ? Anxiety   ? Asthma   ? Basal cell carcinoma   ? lung cancer.  Skin cancer- basal - nose removed  ? Bruises easily   ? Cataract   ? Cervicalgia   ? COPD (chronic obstructive pulmonary disease) ([place])   ? emphysema and COPD  ? Depression   ? Dyspnea   ? uses O2 only when needed. Uses 2 L   ? Foot swelling   ? left- has shown to Drs.  ? Frequency of urination   ? Hardware complicating wound infection ([place]) 12/24/2021  ? Headache   ? History of skin cancer   ? 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 Hypercalcemia   ? Hypertension   ? Hypothyroidism   ? Mixed hyperlipidemia   ? MRSA infection 12/24/2021  ? Osteomyelitis of left tibia ([place]) 12/24/2021  ? Pneumonia   ? Pulmonary nodule   ? Sleep apnea   ? pt uses a cpap nightly  ? Thyroid neoplasm   ? Urinary incontinence   ? Wears dentures   ? Wears glasses   ? ? ?Assessment: ?44 yoF s/p ex lap and colostomy 3/21. Pt has been on DVT prophylaxis, now with RUE DVT. Pt received 40mg  SQ enoxaparin for prophylaxis ~1000 this am. CBC has remained stable, Cr <1. ? ?Goal of Therapy:  ?Monitor platelets by anticoagulation protocol: Yes ?  ?Plan:   ?Enoxaparin 1mg /kg SQ BID - will begin now ?Follow CBC, Cr, longterm AC plans ? ? ?Arrie Senate, PharmD, BCPS, BCCP ?Clinical Pharmacist ?[phone] ?Please check AMION for all [facility] numbers ?01/11/2022 ? ? ?

## 2022-01-11 NOTE — Progress Notes (Signed)
Inpatient Rehab Admissions Coordinator:  ?  ? I met with pt. Regarding potential CIR admit. She is interested and reports that she has 24/7 support at d/c. I will follow for potential CIR admit once medically ready.  ? ?Clemens Catholic, MS, CCC-SLP ?Rehab Admissions Coordinator  ?[phone] (celll) ?[phone] (office) ? ?

## 2022-01-11 NOTE — Progress Notes (Signed)
?   01/11/22 1200  ?OT Recommendation  ?Follow Up Recommendations Acute inpatient rehab (3hours/day)  ?Assistance recommended at discharge Frequent or constant Supervision/Assistance  ?Patient can return home with the following A lot of help with walking and/or transfers;A lot of help with bathing/dressing/bathroom;Assistance with cooking/housework;Direct supervision/assist for medications management;Assist for transportation  ?Functional Status Assessent Patient has had a recent decline in their functional status and demonstrates the ability to make significant improvements in function in a reasonable and predictable amount of time.  ?OT Equipment Other (comment) ?(defert o next venue)  ? ? ?After consultation with AIR coordinator, feel pt would benefit from AIR consult. Updating OT d/c recommendation to AIR. Will continue to follow acutely.  ? ?Tyrone Schimke, OT ?Acute Rehabilitation Services ?Office: [phone] ? ?

## 2022-01-12 ENCOUNTER — Encounter (HOSPITAL_COMMUNITY): Payer: Self-pay | Admitting: Physical Medicine and Rehabilitation

## 2022-01-12 ENCOUNTER — Other Ambulatory Visit: Payer: Self-pay

## 2022-01-12 ENCOUNTER — Other Ambulatory Visit (HOSPITAL_COMMUNITY): Payer: Self-pay

## 2022-01-12 ENCOUNTER — Inpatient Hospital Stay (HOSPITAL_COMMUNITY)
Admission: RE | Admit: 2022-01-12 | Discharge: 2022-01-27 | DRG: 945 | Disposition: A | Discharge status: Home Health | Payer: Medicare Other | Source: Intra-hospital | Attending: Physical Medicine and Rehabilitation | Admitting: Physical Medicine and Rehabilitation

## 2022-01-12 DIAGNOSIS — D62 Acute posthemorrhagic anemia: Secondary | ICD-10-CM | POA: Diagnosis present

## 2022-01-12 DIAGNOSIS — K264 Chronic or unspecified duodenal ulcer with hemorrhage: Secondary | ICD-10-CM | POA: Diagnosis present

## 2022-01-12 DIAGNOSIS — R5381 Other malaise: Principal | ICD-10-CM | POA: Diagnosis present

## 2022-01-12 DIAGNOSIS — K2970 Gastritis, unspecified, without bleeding: Secondary | ICD-10-CM | POA: Diagnosis present

## 2022-01-12 DIAGNOSIS — B9562 Methicillin resistant Staphylococcus aureus infection as the cause of diseases classified elsewhere: Secondary | ICD-10-CM | POA: Diagnosis present

## 2022-01-12 DIAGNOSIS — Z9981 Dependence on supplemental oxygen: Secondary | ICD-10-CM

## 2022-01-12 DIAGNOSIS — Z7901 Long term (current) use of anticoagulants: Secondary | ICD-10-CM

## 2022-01-12 DIAGNOSIS — L89150 Pressure ulcer of sacral region, unstageable: Secondary | ICD-10-CM | POA: Diagnosis present

## 2022-01-12 DIAGNOSIS — J439 Emphysema, unspecified: Secondary | ICD-10-CM | POA: Diagnosis present

## 2022-01-12 DIAGNOSIS — K5720 Diverticulitis of large intestine with perforation and abscess without bleeding: Secondary | ICD-10-CM | POA: Diagnosis not present

## 2022-01-12 DIAGNOSIS — M549 Dorsalgia, unspecified: Secondary | ICD-10-CM | POA: Diagnosis present

## 2022-01-12 DIAGNOSIS — K921 Melena: Secondary | ICD-10-CM

## 2022-01-12 DIAGNOSIS — Z801 Family history of malignant neoplasm of trachea, bronchus and lung: Secondary | ICD-10-CM

## 2022-01-12 DIAGNOSIS — M86262 Subacute osteomyelitis, left tibia and fibula: Secondary | ICD-10-CM

## 2022-01-12 DIAGNOSIS — G4733 Obstructive sleep apnea (adult) (pediatric): Secondary | ICD-10-CM | POA: Diagnosis present

## 2022-01-12 DIAGNOSIS — K140 Glossitis: Secondary | ICD-10-CM | POA: Diagnosis present

## 2022-01-12 DIAGNOSIS — Z7951 Long term (current) use of inhaled steroids: Secondary | ICD-10-CM

## 2022-01-12 DIAGNOSIS — E039 Hypothyroidism, unspecified: Secondary | ICD-10-CM | POA: Diagnosis not present

## 2022-01-12 DIAGNOSIS — F419 Anxiety disorder, unspecified: Secondary | ICD-10-CM | POA: Diagnosis present

## 2022-01-12 DIAGNOSIS — Z20822 Contact with and (suspected) exposure to covid-19: Secondary | ICD-10-CM | POA: Diagnosis not present

## 2022-01-12 DIAGNOSIS — Z888 Allergy status to other drugs, medicaments and biological substances status: Secondary | ICD-10-CM

## 2022-01-12 DIAGNOSIS — Z66 Do not resuscitate: Secondary | ICD-10-CM | POA: Diagnosis present

## 2022-01-12 DIAGNOSIS — N1832 Chronic kidney disease, stage 3b: Secondary | ICD-10-CM | POA: Diagnosis present

## 2022-01-12 DIAGNOSIS — M86252 Subacute osteomyelitis, left femur: Secondary | ICD-10-CM

## 2022-01-12 DIAGNOSIS — I129 Hypertensive chronic kidney disease with stage 1 through stage 4 chronic kidney disease, or unspecified chronic kidney disease: Secondary | ICD-10-CM | POA: Diagnosis present

## 2022-01-12 DIAGNOSIS — F32A Depression, unspecified: Secondary | ICD-10-CM | POA: Diagnosis present

## 2022-01-12 DIAGNOSIS — Z85820 Personal history of malignant melanoma of skin: Secondary | ICD-10-CM

## 2022-01-12 DIAGNOSIS — Z79899 Other long term (current) drug therapy: Secondary | ICD-10-CM | POA: Diagnosis not present

## 2022-01-12 DIAGNOSIS — K5792 Diverticulitis of intestine, part unspecified, without perforation or abscess without bleeding: Secondary | ICD-10-CM | POA: Diagnosis not present

## 2022-01-12 DIAGNOSIS — Z825 Family history of asthma and other chronic lower respiratory diseases: Secondary | ICD-10-CM

## 2022-01-12 DIAGNOSIS — K922 Gastrointestinal hemorrhage, unspecified: Secondary | ICD-10-CM | POA: Diagnosis not present

## 2022-01-12 DIAGNOSIS — K3189 Other diseases of stomach and duodenum: Secondary | ICD-10-CM | POA: Diagnosis not present

## 2022-01-12 DIAGNOSIS — M868X6 Other osteomyelitis, lower leg: Secondary | ICD-10-CM | POA: Diagnosis present

## 2022-01-12 DIAGNOSIS — Z9049 Acquired absence of other specified parts of digestive tract: Secondary | ICD-10-CM | POA: Diagnosis not present

## 2022-01-12 DIAGNOSIS — K319 Disease of stomach and duodenum, unspecified: Secondary | ICD-10-CM | POA: Diagnosis present

## 2022-01-12 DIAGNOSIS — J9611 Chronic respiratory failure with hypoxia: Secondary | ICD-10-CM | POA: Diagnosis present

## 2022-01-12 DIAGNOSIS — Z7952 Long term (current) use of systemic steroids: Secondary | ICD-10-CM

## 2022-01-12 DIAGNOSIS — K279 Peptic ulcer, site unspecified, unspecified as acute or chronic, without hemorrhage or perforation: Secondary | ICD-10-CM | POA: Diagnosis not present

## 2022-01-12 DIAGNOSIS — E782 Mixed hyperlipidemia: Secondary | ICD-10-CM | POA: Diagnosis present

## 2022-01-12 DIAGNOSIS — I10 Essential (primary) hypertension: Secondary | ICD-10-CM | POA: Diagnosis not present

## 2022-01-12 DIAGNOSIS — Z885 Allergy status to narcotic agent status: Secondary | ICD-10-CM

## 2022-01-12 DIAGNOSIS — Z933 Colostomy status: Secondary | ICD-10-CM | POA: Diagnosis not present

## 2022-01-12 DIAGNOSIS — B3749 Other urogenital candidiasis: Secondary | ICD-10-CM | POA: Diagnosis present

## 2022-01-12 DIAGNOSIS — M869 Osteomyelitis, unspecified: Secondary | ICD-10-CM | POA: Diagnosis not present

## 2022-01-12 DIAGNOSIS — Z87891 Personal history of nicotine dependence: Secondary | ICD-10-CM | POA: Diagnosis not present

## 2022-01-12 DIAGNOSIS — G4700 Insomnia, unspecified: Secondary | ICD-10-CM | POA: Diagnosis present

## 2022-01-12 DIAGNOSIS — Z8249 Family history of ischemic heart disease and other diseases of the circulatory system: Secondary | ICD-10-CM

## 2022-01-12 DIAGNOSIS — E890 Postprocedural hypothyroidism: Secondary | ICD-10-CM | POA: Diagnosis present

## 2022-01-12 DIAGNOSIS — Z85118 Personal history of other malignant neoplasm of bronchus and lung: Secondary | ICD-10-CM

## 2022-01-12 DIAGNOSIS — M84462D Pathological fracture, left tibia, subsequent encounter for fracture with routine healing: Secondary | ICD-10-CM | POA: Diagnosis present

## 2022-01-12 DIAGNOSIS — K2971 Gastritis, unspecified, with bleeding: Secondary | ICD-10-CM | POA: Diagnosis not present

## 2022-01-12 DIAGNOSIS — Z86718 Personal history of other venous thrombosis and embolism: Secondary | ICD-10-CM

## 2022-01-12 DIAGNOSIS — F418 Other specified anxiety disorders: Secondary | ICD-10-CM | POA: Diagnosis not present

## 2022-01-12 DIAGNOSIS — Z452 Encounter for adjustment and management of vascular access device: Secondary | ICD-10-CM | POA: Diagnosis not present

## 2022-01-12 DIAGNOSIS — Z8711 Personal history of peptic ulcer disease: Secondary | ICD-10-CM | POA: Diagnosis not present

## 2022-01-12 LAB — BASIC METABOLIC PANEL
Anion gap: 7 (ref 5–15)
BUN: 11 mg/dL (ref 8–23)
CO2: 31 mmol/L (ref 22–32)
Calcium: 8.6 mg/dL — ABNORMAL LOW (ref 8.9–10.3)
Chloride: 101 mmol/L (ref 98–111)
Creatinine, Ser: 0.66 mg/dL (ref 0.44–1.00)
GFR, Estimated: >60 mL/min (ref >60)
Glucose, Bld: 110 mg/dL — ABNORMAL HIGH (ref 70–99)
Potassium: 3.8 mmol/L (ref 3.5–5.1)
Sodium: 139 mmol/L (ref 135–145)

## 2022-01-12 LAB — CBC
HCT: 34.1 % — ABNORMAL LOW (ref 36.0–46.0)
Hemoglobin: 10.3 g/dL — ABNORMAL LOW (ref 12.0–15.0)
MCH: 28 pg (ref 26.0–34.0)
MCHC: 30.2 g/dL (ref 30.0–36.0)
MCV: 92.7 fL (ref 80.0–100.0)
Platelets: 219 10*3/uL (ref 150–400)
RBC: 3.68 MIL/uL — ABNORMAL LOW (ref 3.87–5.11)
RDW: 19.4 % — ABNORMAL HIGH (ref 11.5–15.5)
WBC: 13.1 10*3/uL — ABNORMAL HIGH (ref 4.0–10.5)
nRBC: 0 % (ref 0.0–0.2)

## 2022-01-12 MED ORDER — APIXABAN 5 MG PO TABS
10.0000 mg | ORAL_TABLET | Freq: Two times a day (BID) | ORAL | Status: DC
Start: 2022-01-12 — End: 2022-01-27

## 2022-01-12 MED ORDER — PROCHLORPERAZINE 25 MG RE SUPP
12.5000 mg | Freq: Four times a day (QID) | RECTAL | Status: DC | PRN
Start: 2022-01-12 — End: 2022-01-27

## 2022-01-12 MED ORDER — OXYCODONE-ACETAMINOPHEN 5-325 MG PO TABS
2.0000 | ORAL_TABLET | ORAL | Status: DC | PRN
  Administered 2022-01-12: 2 via ORAL
  Filled 2022-01-12: qty 2

## 2022-01-12 MED ORDER — SODIUM CHLORIDE 0.9% FLUSH: 10.0000 mL | INTRAVENOUS | Status: DC | PRN

## 2022-01-12 MED ORDER — METHOCARBAMOL 500 MG PO TABS: 500.0000 mg | ORAL_TABLET | Freq: Four times a day (QID) | ORAL | Status: DC | PRN

## 2022-01-12 MED ORDER — OXYCODONE HCL 5 MG PO TABS: 10.0000 mg | ORAL_TABLET | ORAL | Status: DC | PRN

## 2022-01-12 MED ORDER — SODIUM CHLORIDE 0.9% FLUSH
10.0000 mL | Freq: Two times a day (BID) | INTRAVENOUS | Status: DC
  Administered 2022-01-12 – 2022-01-26 (×27): 10 mL

## 2022-01-12 MED ORDER — PROCHLORPERAZINE MALEATE 5 MG PO TABS: 5.0000 mg | ORAL_TABLET | Freq: Four times a day (QID) | ORAL | Status: DC | PRN

## 2022-01-12 MED ORDER — FLUCONAZOLE 150 MG PO TABS
150.0000 mg | ORAL_TABLET | Freq: Every day | ORAL | Status: DC
  Administered 2022-01-12: 150 mg via ORAL
  Filled 2022-01-12: qty 1

## 2022-01-12 MED ORDER — APIXABAN 5 MG PO TABS: 5.0000 mg | ORAL_TABLET | Freq: Two times a day (BID) | ORAL | Status: DC

## 2022-01-12 MED ORDER — DAPTOMYCIN IV (FOR PTA / DISCHARGE USE ONLY): 500.0000 mg | INTRAVENOUS | 0 refills | Status: DC

## 2022-01-12 MED ORDER — METOPROLOL SUCCINATE ER 50 MG PO TB24
50.0000 mg | ORAL_TABLET | Freq: Every day | ORAL | Status: DC
  Administered 2022-01-13 – 2022-01-26 (×13): 50 mg via ORAL
  Filled 2022-01-12 (×15): qty 1

## 2022-01-12 MED ORDER — ACETAMINOPHEN 325 MG PO TABS
650.0000 mg | ORAL_TABLET | Freq: Three times a day (TID) | ORAL | Status: DC
  Administered 2022-01-12 – 2022-01-27 (×50): 650 mg via ORAL
  Filled 2022-01-12 (×53): qty 2

## 2022-01-12 MED ORDER — LEVOTHYROXINE SODIUM 25 MCG PO TABS
137.0000 ug | ORAL_TABLET | Freq: Every day | ORAL | Status: DC
  Administered 2022-01-13 – 2022-01-27 (×14): 137 ug via ORAL
  Filled 2022-01-12 (×14): qty 1

## 2022-01-12 MED ORDER — PROCHLORPERAZINE EDISYLATE 10 MG/2ML IJ SOLN: 5.0000 mg | Freq: Four times a day (QID) | INTRAMUSCULAR | Status: DC | PRN

## 2022-01-12 MED ORDER — CHLORHEXIDINE GLUCONATE 0.12 % MT SOLN
15.0000 mL | Freq: Two times a day (BID) | OROMUCOSAL | Status: DC
  Administered 2022-01-12 – 2022-01-27 (×27): 15 mL via OROMUCOSAL
  Filled 2022-01-12 (×27): qty 15

## 2022-01-12 MED ORDER — OXYCODONE HCL 5 MG PO TABS
10.0000 mg | ORAL_TABLET | ORAL | Status: DC | PRN
  Administered 2022-01-14 – 2022-01-27 (×26): 10 mg via ORAL
  Filled 2022-01-12 (×26): qty 2

## 2022-01-12 MED ORDER — ALPRAZOLAM 0.5 MG PO TABS: 0.5000 mg | ORAL_TABLET | Freq: Three times a day (TID) | ORAL | Status: DC | PRN

## 2022-01-12 MED ORDER — BISACODYL 10 MG RE SUPP: 10.0000 mg | Freq: Every day | RECTAL | Status: DC | PRN

## 2022-01-12 MED ORDER — FLEET ENEMA 7-19 GM/118ML RE ENEM: 1.0000 | ENEMA | Freq: Once | RECTAL | Status: DC | PRN

## 2022-01-12 MED ORDER — MIRTAZAPINE 15 MG PO TABS
30.0000 mg | ORAL_TABLET | Freq: Every day | ORAL | Status: DC
  Administered 2022-01-12 – 2022-01-26 (×15): 30 mg via ORAL
  Filled 2022-01-12 (×15): qty 2

## 2022-01-12 MED ORDER — ADULT MULTIVITAMIN W/MINERALS CH
1.0000 | ORAL_TABLET | Freq: Every day | ORAL | Status: DC
  Administered 2022-01-13 – 2022-01-27 (×15): 1 via ORAL
  Filled 2022-01-12 (×15): qty 1

## 2022-01-12 MED ORDER — PREDNISONE 20 MG PO TABS
20.0000 mg | ORAL_TABLET | Freq: Every day | ORAL | Status: DC
Start: 2022-01-13 — End: 2022-01-27
  Administered 2022-01-13 – 2022-01-27 (×15): 20 mg via ORAL
  Filled 2022-01-12 (×15): qty 1

## 2022-01-12 MED ORDER — APIXABAN 5 MG PO TABS: 10.0000 mg | ORAL_TABLET | Freq: Two times a day (BID) | ORAL | Status: DC

## 2022-01-12 MED ORDER — DONEPEZIL HCL 10 MG PO TABS
10.0000 mg | ORAL_TABLET | Freq: Every day | ORAL | Status: DC
  Administered 2022-01-12 – 2022-01-26 (×15): 10 mg via ORAL
  Filled 2022-01-12 (×15): qty 1

## 2022-01-12 MED ORDER — BOOST / RESOURCE BREEZE PO LIQD CUSTOM
1.0000 | Freq: Three times a day (TID) | ORAL | Status: DC
  Administered 2022-01-13 – 2022-01-27 (×30): 1 via ORAL

## 2022-01-12 MED ORDER — METHOCARBAMOL 500 MG PO TABS
500.0000 mg | ORAL_TABLET | Freq: Four times a day (QID) | ORAL | Status: DC
Start: 2022-01-12 — End: 2022-01-24
  Administered 2022-01-12 – 2022-01-23 (×45): 500 mg via ORAL
  Filled 2022-01-12 (×45): qty 1

## 2022-01-12 MED ORDER — BUPROPION HCL ER (XL) 150 MG PO TB24
300.0000 mg | ORAL_TABLET | Freq: Every morning | ORAL | Status: DC
  Administered 2022-01-13 – 2022-01-27 (×15): 300 mg via ORAL
  Filled 2022-01-12 (×15): qty 2

## 2022-01-12 MED ORDER — FLUTICASONE FUROATE-VILANTEROL 100-25 MCG/ACT IN AEPB
1.0000 | INHALATION_SPRAY | Freq: Every day | RESPIRATORY_TRACT | Status: DC
  Administered 2022-01-13 – 2022-01-27 (×14): 1 via RESPIRATORY_TRACT
  Filled 2022-01-12: qty 28

## 2022-01-12 MED ORDER — POLYETHYLENE GLYCOL 3350 17 G PO PACK
17.0000 g | PACK | Freq: Every day | ORAL | Status: DC | PRN
Start: 2022-01-12 — End: 2022-01-27
  Filled 2022-01-12: qty 1

## 2022-01-12 MED ORDER — BUSPIRONE HCL 5 MG PO TABS
5.0000 mg | ORAL_TABLET | Freq: Two times a day (BID) | ORAL | Status: DC
  Administered 2022-01-12 – 2022-01-27 (×30): 5 mg via ORAL
  Filled 2022-01-12 (×30): qty 1

## 2022-01-12 MED ORDER — ALBUTEROL SULFATE (2.5 MG/3ML) 0.083% IN NEBU: 2.5000 mg | INHALATION_SOLUTION | Freq: Four times a day (QID) | RESPIRATORY_TRACT | Status: DC | PRN

## 2022-01-12 MED ORDER — ORAL CARE MOUTH RINSE
15.0000 mL | Freq: Two times a day (BID) | OROMUCOSAL | Status: DC
  Administered 2022-01-13 – 2022-01-25 (×20): 15 mL via OROMUCOSAL

## 2022-01-12 MED ORDER — GUAIFENESIN-DM 100-10 MG/5ML PO SYRP: 5.0000 mL | ORAL_SOLUTION | Freq: Four times a day (QID) | ORAL | Status: DC | PRN

## 2022-01-12 MED ORDER — ALUM & MAG HYDROXIDE-SIMETH 200-200-20 MG/5ML PO SUSP: 30.0000 mL | ORAL | Status: DC | PRN

## 2022-01-12 MED ORDER — TRAZODONE HCL 50 MG PO TABS: 25.0000 mg | ORAL_TABLET | Freq: Every evening | ORAL | Status: DC | PRN

## 2022-01-12 MED ORDER — PANTOPRAZOLE SODIUM 40 MG PO TBEC
40.0000 mg | DELAYED_RELEASE_TABLET | Freq: Two times a day (BID) | ORAL | Status: DC
  Administered 2022-01-12 – 2022-01-22 (×20): 40 mg via ORAL
  Filled 2022-01-12 (×20): qty 1

## 2022-01-12 MED ORDER — APIXABAN 5 MG PO TABS
5.0000 mg | ORAL_TABLET | Freq: Two times a day (BID) | ORAL | Status: DC
  Administered 2022-01-19 – 2022-01-22 (×6): 5 mg via ORAL
  Filled 2022-01-12 (×6): qty 1

## 2022-01-12 MED ORDER — SODIUM CHLORIDE 0.9 % IV SOLN
8.0000 mg/kg | Freq: Every day | INTRAVENOUS | Status: DC
  Administered 2022-01-12 – 2022-01-26 (×15): 500 mg via INTRAVENOUS
  Filled 2022-01-12 (×16): qty 10

## 2022-01-12 MED ORDER — OXYCODONE-ACETAMINOPHEN 5-325 MG PO TABS: 1.0000 | ORAL_TABLET | ORAL | 0 refills | Status: DC | PRN

## 2022-01-12 MED ORDER — UMECLIDINIUM BROMIDE 62.5 MCG/ACT IN AEPB
1.0000 | INHALATION_SPRAY | Freq: Every day | RESPIRATORY_TRACT | Status: DC
  Administered 2022-01-13 – 2022-01-27 (×13): 1 via RESPIRATORY_TRACT
  Filled 2022-01-12 (×2): qty 7

## 2022-01-12 MED ORDER — DIPHENHYDRAMINE HCL 12.5 MG/5ML PO ELIX: 12.5000 mg | ORAL_SOLUTION | Freq: Four times a day (QID) | ORAL | Status: DC | PRN

## 2022-01-12 MED ORDER — ONDANSETRON 4 MG PO TBDP
4.0000 mg | ORAL_TABLET | Freq: Four times a day (QID) | ORAL | Status: DC | PRN
  Administered 2022-01-15 – 2022-01-17 (×2): 4 mg via ORAL
  Filled 2022-01-12 (×2): qty 1

## 2022-01-12 MED ORDER — CHLORHEXIDINE GLUCONATE CLOTH 2 % EX PADS: 6.0000 | MEDICATED_PAD | Freq: Every day | CUTANEOUS | Status: DC

## 2022-01-12 MED ORDER — ATORVASTATIN CALCIUM 10 MG PO TABS
20.0000 mg | ORAL_TABLET | Freq: Every day | ORAL | Status: DC
  Administered 2022-01-12 – 2022-01-26 (×15): 20 mg via ORAL
  Filled 2022-01-12 (×15): qty 2

## 2022-01-12 MED ORDER — APIXABAN 5 MG PO TABS
10.0000 mg | ORAL_TABLET | Freq: Two times a day (BID) | ORAL | Status: AC
Start: 2022-01-12 — End: 2022-01-19
  Administered 2022-01-12 – 2022-01-19 (×14): 10 mg via ORAL
  Filled 2022-01-12 (×14): qty 2

## 2022-01-12 NOTE — TOC Benefit Eligibility Note (Signed)
Patient Advocate Encounter ? ?Insurance verification completed.   ? ?The patient is currently admitted and upon discharge could be taking Xarelto 20 mg. ? ?The current 30 day co-pay is, $46.00.  ? ?The patient is currently admitted and upon discharge could be taking Eliquis 5 mg. ? ?The current 30 day co-pay is, $46.00.  ? ?The patient is insured through [employer] Part D  ? ? ? ?Lyndel Safe, CPhT ?Pharmacy Patient Advocate Specialist ?[facility] Patient Advocate Team ?Direct Number: [phone]  Fax: [phone] ? ? ? ? ? ?  ?

## 2022-01-12 NOTE — Progress Notes (Signed)
Physical Therapy Treatment ?Patient Details ?Name: Holly [name] ?MRN: [id number] ?DOB: 12-10-1945 ?Today's Date: 01/12/2022 ? ? ?History of Present Illness Pt is a 76 y/o female presenting on 3/18 from [facility] for abdominal pain.  Found with sigmoid diverticulitis with contained perforation and abscess formation. 3/21 SIGMOID COLECTOMY AND COLOSTOMY    PMH includes:  L tibial plateau ORIF on 2/27 (osteomyelitis). ?malignancy; anemia, basal cell carcinoma/lung cancer, COPD on 3L O2, HTN, PNA, CKD. ? ?  ?PT Comments  ? ? Pt admitted with above diagnosis. Pt was able to pivot to recliner just before PT came in with OT with pt needing mod assist for pivot transfer to chair using RW. Pt needed time to recover with sats on 3L 87% after transfer. Pt was able to stand again with PT and then performed LE exercises.  Pt going to Rehab today. Sats 94% on 3L on departure.  Pt currently with functional limitations due to balance and endurance deficits. Pt will benefit from skilled PT to increase their independence and safety with mobility to allow discharge to the venue listed below.      ?Recommendations for follow up therapy are one component of a multi-disciplinary discharge planning process, led by the attending physician.  Recommendations may be updated based on patient status, additional functional criteria and insurance authorization. ? ?Follow Up Recommendations ? Acute inpatient rehab (3hours/day) ?  ?  ?Assistance Recommended at Discharge Intermittent Supervision/Assistance  ?Patient can return home with the following A lot of help with walking and/or transfers;A lot of help with bathing/dressing/bathroom;Assistance with cooking/housework;Direct supervision/assist for medications management;Assist for transportation;Help with stairs or ramp for entrance ?  ?Equipment Recommendations ? None recommended by PT  ?  ?Recommendations for Other Services   ? ? ?  ?Precautions / Restrictions Precautions ?Precautions:  Fall ?Precaution Comments: colostomy; rt quadrant drain;  recent left tibia fx ?Restrictions ?Weight Bearing Restrictions: Yes ?LLE Weight Bearing: Weight bearing as tolerated ?Other Position/Activity Restrictions: WBAT for transfers only  ?  ? ?Mobility ? Bed Mobility ?  ?  ?  ?  ?  ?  ?  ?General bed mobility comments: in chair on arrival ?  ? ?Transfers ?Overall transfer level: Needs assistance ?Equipment used: Rolling walker (2 wheels) ?Transfers: Sit to/from Stand ?Sit to Stand: Min assist ?  ?  ?  ?  ?  ?General transfer comment: Pt completed sit to stand to the RW.  Slight increased anxiety as well as increased trunk flexion in standing.  Oxygen sats decreasing to 87% on 2Ls nasal cannula. Pt positioned in recliner better as she had just gotten to chair with OT on PT arrival. PT took over and pt did exercises in chair. ?  ? ?Ambulation/Gait ?  ?  ?  ?  ?  ?  ?  ?General Gait Details: transfers only with WBAT ? ? ?Stairs ?  ?  ?  ?  ?  ? ? ?Wheelchair Mobility ?  ? ?Modified Rankin (Stroke Patients Only) ?  ? ? ?  ?Balance Overall balance assessment: Needs assistance ?Sitting-balance support: Bilateral upper extremity supported ?Sitting balance-Leahy Scale: Good ?  ?  ?Standing balance support: Bilateral upper extremity supported, Reliant on assistive device for balance ?Standing balance-Leahy Scale: Zero ?Standing balance comment: Therapist assist as well as assistive device when standing. ?  ?  ?  ?  ?  ?  ?  ?  ?  ?  ?  ?  ? ?  ?Cognition Arousal/Alertness: Awake/alert ?Behavior  During Therapy: [facility] for tasks assessed/performed ?Overall Cognitive Status: Within Functional Limits for tasks assessed ?  ?  ?  ?  ?  ?  ?  ?  ?  ?  ?  ?  ?  ?  ?  ?  ?General Comments: able to move with extra time to accomplish the task and encouragement ?  ?  ? ?  ?Exercises General Exercises - Lower Extremity ?Ankle Circles/Pumps: AROM, Both, 10 reps ?Quad Sets: AROM, Supine, Both, 10 reps ?Long Arc Quad: AROM, Both, 15 reps,  Seated ?Heel Slides: AROM, Supine, Both, 10 reps ?Hip ABduction/ADduction: AROM, Supine, 10 reps, Both ?Straight Leg Raises: Supine, AAROM, Both, 10 reps ? ?  ?General Comments   ?  ?  ? ?Pertinent Vitals/Pain Pain Assessment ?Pain Assessment: Faces ?Faces Pain Scale: Hurts little more ?Pain Location: abdomen and LLE during transfer/bed mobility ?Pain Descriptors / Indicators: Grimacing, Guarding ?Pain Intervention(s): Limited activity within patient's tolerance, Monitored during session, Repositioned  ? ? ?Home Living   ?  ?  ?  ?  ?  ?  ?  ?  ?  ?   ?  ?Prior Function    ?  ?  ?   ? ?PT Goals (current goals can now be found in the care plan section) Acute Rehab PT Goals ?Patient Stated Goal: return home ?Progress towards PT goals: Progressing toward goals ? ?  ?Frequency ? ? ? Min 3X/week ? ? ? ?  ?PT Plan Current plan remains appropriate  ? ? ?Co-evaluation   ?  ?  ?  ?  ? ?  ?AM-PAC PT "6 Clicks" Mobility   ?Outcome Measure ? Help needed turning from your back to your side while in a flat bed without using bedrails?: A Little ?Help needed moving from lying on your back to sitting on the side of a flat bed without using bedrails?: A Lot ?Help needed moving to and from a bed to a chair (including a wheelchair)?: A Lot ?Help needed standing up from a chair using your arms (e.g., wheelchair or bedside chair)?: A Lot ?Help needed to walk in hospital room?: Total ?Help needed climbing 3-5 steps with a railing? : Total ?6 Click Score: 11 ? ?  ?End of Session Equipment Utilized During Treatment: Gait belt;Oxygen ?Activity Tolerance: Patient limited by pain;Patient limited by fatigue ?Patient left: in chair;with call bell/phone within reach;with chair alarm set ?Nurse Communication: Mobility status;Weight bearing status (instructions for return to bed and WB on LLE) ?PT Visit Diagnosis: History of falling (Z91.81);Difficulty in walking, not elsewhere classified (R26.2) ?  ? ? ?Time: 3903-0092 ?PT Time Calculation (min)  (ACUTE ONLY): 26 min ? ?Charges:  $Therapeutic Exercise: 8-22 mins ?$Therapeutic Activity: 8-22 mins          ?          ? ?Taris Galindo M,PT ?Acute Rehab Services ?[phone] ?[phone] (pager)  ? ? ?Alvira Philips ?01/12/2022, 1:58 PM ? ?

## 2022-01-12 NOTE — PMR Pre-admission (Signed)
PMR Admission Coordinator Pre-Admission Assessment ? ?Patient: Holly [name] is an 76 y.o., female ?MRN: [id number] ?DOB: 11/15/1945 ?Height: 5' (152.4 cm) ?Weight: 62.1 kg ? ?Insurance Information ?HMO:     PPO:      PCP:      IPA:      80/20:      OTHER:  ?PRIMARY: Medicare       Policy#: [id number]      Subscriber: Pt. ?Phone#: Verified online    Fax#:  ?Pre-Cert#:       Employer:  ?Benefits:  Phone #:      Name:  ?Eff. Date: Parts A ad B effective 10/17/09  Deduct: $1600      Out of Pocket Max:  None      Life Max: N/A  ?CIR: 100%      SNF: 100 days ?Outpatient: 80%     Co-Pay: 20% ?Home Health: 100%      Co-Pay: none ?DME: 80%     Co-Pay: 20% ?Providers: patient's choice ?Providers: In network ? ?SECONDARY: BCBS Supplement      Policy#: [id number]     Phone#:  ? ?Financial Counselor:       Phone#:  ? ?The ?Data Collection Information Summary? for patients in Inpatient Rehabilitation Facilities with attached ?Privacy Act [place] Records? was provided and verbally reviewed with: Patient ? ?Emergency Contact Information ?Contact Information   ? ? Name Relation Home Work Mobile  ? McDuffy,[name] Daughter [phone]  [phone]  ? Harris,Rachel Friend [phone]  [phone]  ? ?  ? ? ?Current Medical History  ?Patient Admitting Diagnosis: bowel perforation s/p colectomy/colostomy ?History of Present Illness: Holly [name] is a 76 y.o. female with medical history significant of hypertension, dyslipidemia, COPD on 3 L of oxygen, hypothyroidism, chronic anemia, anxiety, and cancer(melanoma of the nose, basal cell carcinoma, NSCLC, and thyroid neoplasm) presented to [facility] with complaints of abdominal pain.  She had just underwent open reduction and internal fixation of the of the tibial plateau on 2/27 after CT scan revealed tibial plateau fracture with multiple areas of lytic bone lesions.  Interoperatively, biopsies came back negative for malignancy, but were positive for MRSA.   Patient had been placed on doxycycline for approximately 3 weeks and had PICC line placed 3 days ago to receive IV daptomycin per ID recommendations.  When  patient was initially seen at [facility] 12/31/21  she was  noted to be afebrile with white blood cell count 14, lactic acid 4.3, hemoglobin 8.7 BUN 27, creatinine 1.3, glucose 256, and albumin 3.1.  COVID-19 screening was negative CT scan of the abdomen pelvis significant for sigmoid diverticulitis with likely contained perforation with complex 5.4 x 5.6 cm gas and fluid collection as well as suggestion of a 5.6 x 3.5 cm developing sigmoid organized fluid collection.  Patient was given IV fluids, Tylenol, pain medications, started on ertapenem. She was transferred to [facility] 01/01/22 for further treatment . She underwent sigmoid colectomy ad colostomy 01/04/22. She developed some anemia 3/27 and received one unit of PRBCs and RUE venous Doppler on 3/28 was significant for axillary DVT. Pt. Was recommended to CIR to assist return to PLOF.  ? ?Patient's medical record from [facility] has been reviewed by the rehabilitation admission coordinator and physician. ? ?Past Medical History  ?Past Medical History:  ?Diagnosis Date  ? Anemia   ? blood transfusion November 2022  ? Anxiety   ? Asthma   ? Basal cell carcinoma   ?  lung cancer.  Skin cancer- basal - nose removed  ? Bruises easily   ? Cataract   ? Cervicalgia   ? COPD (chronic obstructive pulmonary disease) ([place])   ? emphysema and COPD  ? Depression   ? Dyspnea   ? uses O2 only when needed. Uses 2 L   ? Foot swelling   ? left- has shown to Drs.  ? Frequency of urination   ? Hardware complicating wound infection ([place]) 12/24/2021  ? Headache   ? History of skin cancer   ? 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 Hypercalcemia   ? Hypertension   ? Hypothyroidism   ? Mixed hyperlipidemia   ? MRSA infection 12/24/2021  ? Osteomyelitis of left tibia ([place]) 12/24/2021  ? Pneumonia   ? Pulmonary nodule   ?  Sleep apnea   ? pt uses a cpap nightly  ? Thyroid neoplasm   ? Urinary incontinence   ? Wears dentures   ? Wears glasses   ? ? ?Has the patient had major surgery during 100 days prior to admission? Yes ? ?Family History   ?family history includes Allergies in her father; Asthma in her father; Coronary artery disease in her father; Hypertension in her father and mother; Lung cancer in her mother. ? ?Current Medications ? ?Current Facility-Administered Medications:  ?  0.9 %  sodium chloride infusion (Manually program via Guardrails IV Fluids), , Intravenous, Once, Meuth, Brooke A, PA-C ?  acetaminophen (TYLENOL) tablet 1,000 mg, 1,000 mg, Oral, Q6H, Meuth, Brooke A, PA-C, 1,000 mg at 01/12/22 9563 ?  albuterol (PROVENTIL) (2.5 MG/3ML) 0.083% nebulizer solution 2.5 mg, 2.5 mg, Nebulization, Q6H PRN, Meuth, Brooke A, PA-C ?  ALPRAZolam Duanne Moron) tablet 0.5 mg, 0.5 mg, Oral, TID PRN, Elgergawy, Silver Huguenin, MD, 0.5 mg at 01/10/22 1603 ?  atorvastatin (LIPITOR) tablet 20 mg, 20 mg, Oral, QHS, Meuth, Brooke A, PA-C, 20 mg at 01/11/22 2201 ?  buPROPion (WELLBUTRIN XL) 24 hr tablet 300 mg, 300 mg, Oral, q morning, Meuth, Brooke A, PA-C, 300 mg at 01/11/22 1042 ?  busPIRone (BUSPAR) tablet 5 mg, 5 mg, Oral, BID, Meuth, Brooke A, PA-C, 5 mg at 01/11/22 2201 ?  chlorhexidine (PERIDEX) 0.12 % solution 15 mL, 15 mL, Mouth Rinse, BID, Meuth, Brooke A, PA-C, 15 mL at 01/11/22 2200 ?  DAPTOmycin (CUBICIN) 500 mg in sodium chloride 0.9 % IVPB, 8 mg/kg, Intravenous, Q2000, Meuth, Brooke A, PA-C, Last Rate: 120 mL/hr at 01/11/22 2104, 500 mg at 01/11/22 2104 ?  donepezil (ARICEPT) tablet 10 mg, 10 mg, Oral, QHS, Meuth, Brooke A, PA-C, 10 mg at 01/11/22 2200 ?  enoxaparin (LOVENOX) injection 60 mg, 60 mg, Subcutaneous, Q12H, Einar Grad, RPH, 60 mg at 01/12/22 0444 ?  feeding supplement (BOOST / RESOURCE BREEZE) liquid 1 Container, 1 Container, Oral, BID BM, Elgergawy, Silver Huguenin, MD, 1 Container at 01/11/22 1039 ?  feeding supplement  (ENSURE ENLIVE / ENSURE PLUS) liquid 237 mL, 237 mL, Oral, BID BM, Elgergawy, Silver Huguenin, MD, 237 mL at 01/11/22 1243 ?  fluticasone furoate-vilanterol (BREO ELLIPTA) 100-25 MCG/ACT 1 puff, 1 puff, Inhalation, Daily, 1 puff at 01/11/22 0810 **AND** umeclidinium bromide (INCRUSE ELLIPTA) 62.5 MCG/ACT 1 puff, 1 puff, Inhalation, Daily, Meuth, Brooke A, PA-C, 1 puff at 01/11/22 0810 ?  HYDROmorphone (DILAUDID) injection 0.5 mg, 0.5 mg, Intravenous, Q6H PRN, Elgergawy, Silver Huguenin, MD, 0.5 mg at 01/12/22 0444 ?  levothyroxine (SYNTHROID) tablet 137 mcg, 137 mcg, Oral, Q0600, Meuth, Brooke A, PA-C, 137 mcg at 01/12/22  0643 ?  MEDLINE mouth rinse, 15 mL, Mouth Rinse, q12n4p, Meuth, Brooke A, PA-C, 15 mL at 01/11/22 1719 ?  methocarbamol (ROBAXIN) tablet 500 mg, 500 mg, Oral, Q6H, Meuth, Brooke A, PA-C, 500 mg at 01/12/22 0443 ?  metoprolol succinate (TOPROL-XL) 24 hr tablet 50 mg, 50 mg, Oral, Daily, Elgergawy, Silver Huguenin, MD, 50 mg at 01/11/22 1038 ?  mirtazapine (REMERON) tablet 30 mg, 30 mg, Oral, QHS, Meuth, Brooke A, PA-C, 30 mg at 01/11/22 2201 ?  multivitamin with minerals tablet 1 tablet, 1 tablet, Oral, Daily, Meuth, Brooke A, PA-C, 1 tablet at 01/11/22 1038 ?  ondansetron (ZOFRAN) injection 4 mg, 4 mg, Intravenous, Q6H PRN, Jonetta Osgood, MD, 4 mg at 01/04/22 2001 ?  ondansetron (ZOFRAN-ODT) disintegrating tablet 4 mg, 4 mg, Oral, Q6H PRN, Meuth, Brooke A, PA-C ?  oxyCODONE (Oxy IR/ROXICODONE) immediate release tablet 5-10 mg, 5-10 mg, Oral, Q4H PRN, Meuth, Brooke A, PA-C, 10 mg at 01/12/22 8756 ?  pantoprazole (PROTONIX) EC tablet 40 mg, 40 mg, Oral, BID, Meuth, Brooke A, PA-C, 40 mg at 01/11/22 2201 ?  predniSONE (DELTASONE) tablet 20 mg, 20 mg, Oral, Daily, Meuth, Brooke A, PA-C, 20 mg at 01/11/22 1038 ?  prochlorperazine (COMPAZINE) injection 10 mg, 10 mg, Intravenous, Q6H PRN, Meuth, Brooke A, PA-C ?  sodium chloride flush (NS) 0.9 % injection 3 mL, 3 mL, Intravenous, Q12H, Meuth, Brooke A, PA-C, 3 mL at  01/11/22 2202 ? ?Patients Current Diet:  ?Diet Order   ? ?       ?  DIET SOFT Room service appropriate? Yes; Fluid consistency: Thin  Diet effective now       ?  ? ?  ?  ? ?  ? ? ?Precautions / Restrictions ?

## 2022-01-12 NOTE — Discharge Instructions (Addendum)
Information on my medicine - ELIQUIS? (apixaban) ? ?This medication education was reviewed with me or my healthcare representative as part of my discharge preparation.  ? ?Why was Eliquis? prescribed for you? ?Eliquis? was prescribed to treat blood clots that may have been found in the veins of your legs (deep vein thrombosis) or in your lungs (pulmonary embolism) and to reduce the risk of them occurring again. ? ?What do You need to know about Eliquis? ? ?The starting dose is 10 mg (two 5 mg tablets) taken TWICE daily for the FIRST SEVEN (7) DAYS, then on 01/19/2022 at 20:00 (8 PM)  the dose is reduced to ONE 5 mg tablet taken TWICE daily.  Eliquis? may be taken with or without food.  ? ?Try to take the dose about the same time in the morning and in the evening. If you have difficulty swallowing the tablet whole please discuss with your pharmacist how to take the medication safely. ? ?Take Eliquis? exactly as prescribed and DO NOT stop taking Eliquis? without talking to the doctor who prescribed the medication.  Stopping may increase your risk of developing a new blood clot.  Refill your prescription before you run out. ? ?After discharge, you should have regular check-up appointments with your healthcare provider that is prescribing your Eliquis?. ?   ?What do you do if you miss a dose? ?If a dose of ELIQUIS? is not taken at the scheduled time, take it as soon as possible on the same day and twice-daily administration should be resumed. The dose should not be doubled to make up for a missed dose. ? ?Important Safety Information ?A possible side effect of Eliquis? is bleeding. You should call your healthcare provider right away if you experience any of the following: ?Bleeding from an injury or your nose that does not stop. ?Unusual colored urine (red or dark brown) or unusual colored stools (red or black). ?Unusual bruising for unknown reasons. ?A serious fall or if you hit your head (even if there is no  bleeding). ? ?Some medicines may interact with Eliquis? and might increase your risk of bleeding or clotting while on Eliquis?Marland Kitchen To help avoid this, consult your healthcare provider or pharmacist prior to using any new prescription or non-prescription medications, including herbals, vitamins, non-steroidal anti-inflammatory drugs (NSAIDs) and supplements. ? ?This website has more information on Eliquis? (apixaban): http://www.eliquis.com/eliquis/home ? ?

## 2022-01-12 NOTE — Discharge Summary (Signed)
Physician Discharge Summary  ?Holly [name] KCM:[id number] DOB: 09-10-1946 DOA: 01/01/2022 ? ?PCP: Harlan Stains, MD ? ?Admit date: 01/01/2022 ?Discharge date: 01/12/2022 ? ?Admitted From: Home from [facility] ?Disposition: Acute inpatient rehab ? ?Recommendations for Outpatient Follow-up:  ?Follow up with PCP in 1-2 weeks ?ID will schedule follow-up ?Surgery will schedule follow-up ? ?Home Health: N/A ?Equipment/Devices: N/A ? ?Discharge Condition: Stable ?CODE STATUS: DNR ?Diet recommendation: Regular diet ? ?Discharge summary: ?Patient is a 76 y.o.  female with history of HTN, HLD, COPD on 3 L of oxygen at home, chronic prednisone therapy, lung CA, recent left tibial plateau pathological fracture-found to have MRSA on cultures-on IV daptomycin as outpatient -presented to [facility] for abdominal pain-upon further evaluation she was found to have perforated sigmoid diverticulitis with abscess formation.  She was then transferred to [facility] for further evaluation and treatment.   ?  ?Significant events: ?2/27>> ORIF/biopsy of left tibial plateau fracture (biopsy negative for malignancy-culture positive for MRSA) ?3/18>> transfer from [facility]-sigmoid diverticulitis with perforation/abscess formation ?3/21>> worsening abdominal pain s/p sigmoid colectomy with end colostomy on 01/04/22 ?3/27>> 1 unit PRBC transfusion for hemoglobin of 7.8. ?3/28>> right upper extremity venous Doppler significant for axillary DVT.  PICC line removed. ?  ?Significant microbiology data: ?2/27>> culture left proximal tibia: Rare MRSA ?  ?Procedures: ?2/27>> ORIF/biopsy of left tibial fracture (Dr. Doreatha Martin) ? ?Assessment and plan of care: ? ?Perforated sigmoid diverticulitis with pelvic abscess/failed conservative management: ?Sigmoid colectomy with end colostomy 3/21, multiple large abscesses evacuated, patient completed antibiotic therapy, currently tolerating full liquid diet and advancing to soft diet.  Bowel function is  returning.  Pain managed with Tylenol, oxycodone and occasional Dilaudid injection. ?Clinically stable as per surgery, wound care instructions, suture and staples removal as per surgery.  Leukocytosis probably due to chronic steroids.  No evidence of ongoing infection.  JP drains are out. ? 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 Doreatha Martin.  Biopsy negative for cancer.  Positive for MRSA.  Followed by Dr. Lucianne Lei dam. ?Right upper extremity PICC line, removed with axillary clot.  Currently on daptomycin.  Continue daptomycin until 4/26. ?Weightbearing for transfers. ?Currently with peripheral IV line, will need PICC line when she is nearing discharge from acute inpatient rehab to complete daptomycin until 4/26. ?Drug monitoring, medication management as per ID clinic. ? ?Right axillary vein DVT secondary to PICC line: ?PICC line removed.  3 months of anticoagulation with Eliquis. ? ?Electrolytes: Replaced and improved. ? ?AKI: Improved.  She has baseline CKD stage IIIb. ? ?Hypothyroidism: On Synthroid.  Continue. ? 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  On bronchodilators and steroid inhalers continue.  Stable.  She is on home oxygen. ? ?After sleep apnea: CPAP at night. ? ?Acute blood loss anemia: She does have anemia of chronic disease.  Received total 3 units of PRBC since admission with stabilization.  No active bleeding. ? ?Medically stable to transfer to inpatient rehab level of care.  Antibiotic management and pain management will continue along with multidisciplinary rehab. ?  ? ? ?Discharge Diagnoses:  ?Principal Problem: ?  Diverticulitis of intestine with perforation ?Active Problems: ?  Anxiety state ?  COPD exacerbation ([place]) ?  Chronic respiratory failure with hypoxia ([facility]) ?  GERD (gastroesophageal reflux disease) ?  DNR (do not resuscitate) ?  Malignant neoplasm of middle lobe of right lung ([place]) ?   Osteomyelitis ([place]) ?  CKD (  chronic kidney disease) stage 3 B ?  OSA on CPAP ?  Hypothyroidism ?  Pressure ulcer ? ? ? ?Discharge Instructions ? ?Discharge Instructions   ? ? [facility] pharmacist to adjust dose for Vancomycin, Aminoglycosides and other anti-infective therapies as requested by physician.   Complete by: As directed ?  ? [facility] to provide Cath Flo 17m   Complete by: As directed ?  ? Administer for PICC line occlusion and as ordered by physician for other access device issues.  ? Anaphylaxis Kit: Provided to treat any anaphylactic reaction to the medication being provided to the patient if First Dose or when requested by physician   Complete by: As directed ?  ? Epinephrine 159mml vial / amp: Administer 0.64m80m0.64ml64mubcutaneously once for moderate to severe anaphylaxis, nurse to call physician and pharmacy when reaction occurs and call 911 if needed for immediate care  ? Diphenhydramine 50mg36mIV vial: Administer 25-50mg 70mM PRN for first dose reaction, rash, itching, mild reaction, nurse to call physician and pharmacy when reaction occurs  ? Sodium Chloride 0.9% NS 500ml I81mdminister if needed for hypovolemic blood pressure drop or as ordered by physician after call to physician with anaphylactic reaction  ? Call MD for:  redness, tenderness, or signs of infection (pain, swelling, redness, odor or green/yellow discharge around incision site)   Complete by: As directed ?  ? Call MD for:  severe uncontrolled pain   Complete by: As directed ?  ? Call MD for:  temperature >100.4   Complete by: As directed ?  ? Change dressing on IV access line weekly and PRN   Complete by: As directed ?  ? Diet - low sodium heart healthy   Complete by: As directed ?  ? Discharge instructions   Complete by: As directed ?  ? Staples and incision care attached as per surgery post op plan of care  ? Discharge wound care:   Complete by: As directed ?  ? Wound care  Every shift      ?Comments:  Abd or gauze with tape to midline incision/penrose site. Change bid or as need for saturation  ? Flush IV access with Sodium Chloride 0.9% and Heparin 10 units/ml or 100 units/ml   Complete by: As directed ?  ? Home infusion instructions - [facility]   Complete by: As directed ?  ? Instructions: Flush IV access with Sodium Chloride 0.9% and Heparin 10units/ml or 100units/ml  ? Change dressing on IV access line: Weekly and PRN  ? Instructions Cath Flo 2mg: Ad54mister for PICC Line occlusion and as ordered by physician for other access device  ? [facility] pharmacist to adjust dose for: Vancomycin, Aminoglycosides and other anti-infective therapies as requested by physician  ? Increase activity slowly   Complete by: As directed ?  ? Method of administration may be changed at the discretion of home infusion pharmacist based upon assessment of the patient and/or caregiver?s ability to self-administer the medication ordered   Complete by: As directed ?  ? ?  ? ?Allergies as of 01/12/2022   ? ?   Reactions  ? Amlodipine Besylate Swelling  ? Edema  ? Codeine Nausea And Vomiting  ? Oxycodone-acetaminophen Nausea And Vomiting  ? ?  ? ?  ?Medication List  ?  ? ?STOP taking these medications   ? ?aspirin 325 MG tablet ?  ?DAPTOmycin 500 MG injection ?Commonly known as: CUBICIN ?Replaced by: daptomycin  IVPB ?  ?  ondansetron 8 MG tablet ?Commonly known as: ZOFRAN ?  ?polyethylene glycol 17 g packet ?Commonly known as: MIRALAX / GLYCOLAX ?  ?traMADol 50 MG tablet ?Commonly known as: ULTRAM ?  ? ?  ? ?TAKE these medications   ? ?acetaminophen 650 MG CR tablet ?Commonly known as: TYLENOL ?Take 650 mg by mouth every 8 (eight) hours as needed for pain. ?  ?ALPRAZolam 0.5 MG tablet ?Commonly known as: Duanne Moron ?Take 0.5 mg by mouth 2 (two) times daily. ?  ?apixaban 5 MG Tabs tablet ?Commonly known as: ELIQUIS ?Take 2 tablets (10 mg total) by mouth 2 (two) times daily for 6 days. ?  ?apixaban 5 MG Tabs  tablet ?Commonly known as: ELIQUIS ?Take 1 tablet (5 mg total) by mouth 2 (two) times daily. ?Start taking on: January 19, 2022 ?  ?atorvastatin 20 MG tablet ?Commonly known as: LIPITOR ?Take 20 mg by mouth at bedtime. ?  ?buP

## 2022-01-12 NOTE — Progress Notes (Signed)
8 Days Post-Op  ? ?Subjective/Chief Complaint: ?Continues to eat well.  Bag working well.  Changed again this morning.  Had some pain overnight but improved this morning. ? ? ?Objective: ?Vital signs in last 24 hours: ?Temp:  [98.2 ?F (36.8 ?C)-98.6 ?F (37 ?C)] 98.3 ?F (36.8 ?C) (03/29 0740) ?Pulse Rate:  [61-72] 72 (03/29 0740) ?Resp:  [17-22] 19 (03/29 0740) ?BP: (116-161)/(52-65) 161/65 (03/29 0740) ?SpO2:  [94 %-100 %] 94 % (03/29 0740) ?Last BM Date : 01/12/22 ? ?Intake/Output from previous day: ?03/28 0701 - 03/29 0700 ?In: -  ?Out: 3267 [TIWPY:0998; PJASN:053] ?Intake/Output this shift: ?No intake/output data recorded. ? ?Gen:  Alert, NAD, pleasant ?Abd: soft, ND, mild lower abdominal TTP, midline c/d/I. colostomy viable with no output as bag just changed again today. ? ?Lab Results:  ?Recent Labs  ?  01/11/22 ?0145 01/12/22 ?0345  ?WBC 12.5* 13.1*  ?HGB 9.1* 10.3*  ?HCT 29.8* 34.1*  ?PLT 233 219  ? ?BMET ?Recent Labs  ?  01/11/22 ?0145 01/12/22 ?0345  ?NA 139 139  ?K 3.5 3.8  ?CL 102 101  ?CO2 32 31  ?GLUCOSE 138* 110*  ?BUN 12 11  ?CREATININE 0.74 0.66  ?CALCIUM 8.3* 8.6*  ? ?PT/INR ?No results for input(s): LABPROT, INR in the last 72 hours. ?ABG ?No results for input(s): PHART, HCO3 in the last 72 hours. ? ?Invalid input(s): PCO2, PO2 ? ?Studies/Results: ?VAS [place] UPPER EXTREMITY VENOUS DUPLEX ? ?Result Date: 01/11/2022 ?UPPER VENOUS STUDY  Patient [facility]  Date of Exam:   01/11/2022 Medical Rec #: [id number]      Accession #:    [id number] Date of Birth: 1946/04/21      Patient Gender: F Patient Age:   44 years Exam Location:  [facility] Procedure:      VAS [place] UPPER EXTREMITY VENOUS DUPLEX Referring Phys: Archie Patten HALL --------------------------------------------------------------------------------  Indications: Edema, Erythema, and Right upper extremity PICC line Comparison Study: No prior study Performing Technologist: Maudry Mayhew MHA, RDMS, RVT, RDCS  Examination Guidelines: A  complete evaluation includes B-mode imaging, spectral Doppler, color Doppler, and power Doppler as needed of all accessible portions of each vessel. Bilateral testing is considered an integral part of a complete examination. Limited examinations for reoccurring indications may be performed as noted.  Right Findings: +----------+------------+---------+-----------+----------+--------------------+ RIGHT     CompressiblePhasicitySpontaneousProperties      Summary        +----------+------------+---------+-----------+----------+--------------------+ IJV           Full       Yes       Yes                                   +----------+------------+---------+-----------+----------+--------------------+ Subclavian    Full       Yes       Yes                                   +----------+------------+---------+-----------+----------+--------------------+ Axillary      None                 No                Acute surrounding  PICC         +----------+------------+---------+-----------+----------+--------------------+ Brachial      Full       Yes       Yes                                   +----------+------------+---------+-----------+----------+--------------------+ Radial        Full                                                       +----------+------------+---------+-----------+----------+--------------------+ Ulnar         Full                                                       +----------+------------+---------+-----------+----------+--------------------+ Cephalic      Full                                                       +----------+------------+---------+-----------+----------+--------------------+ Basilic       Full                                                       +----------+------------+---------+-----------+----------+--------------------+  Left Findings:  +----------+------------+---------+-----------+----------+-------+ LEFT      CompressiblePhasicitySpontaneousPropertiesSummary +----------+------------+---------+-----------+----------+-------+ Subclavian               Yes       Yes                      +----------+------------+---------+-----------+----------+-------+  Summary:  Right: No evidence of superficial vein thrombosis in the upper extremity. Findings consistent with acute deep vein thrombosis involving the right axillary vein surrounding the PICC line.  Left: No evidence of thrombosis in the subclavian.  *See table(s) above for measurements and observations.  Diagnosing physician: Deitra Mayo MD Electronically signed by Deitra Mayo MD on 01/11/2022 at 2:57:36 PM.    Final    ? ?Anti-infectives: ?Anti-infectives (From admission, onward)  ? ? Start     Dose/Rate Route Frequency Ordered Stop  ? 01/01/22 2000  DAPTOmycin (CUBICIN) injection 500 mg  Status:  Discontinued       ? 10 mL Intravenous Daily 01/01/22 1400 01/01/22 1425  ? 01/01/22 1515  DAPTOmycin (CUBICIN) 500 mg in sodium chloride 0.9 % IVPB       ? 8 mg/kg ? 62.1 kg ?120 mL/hr over 30 Minutes Intravenous Daily 01/01/22 1425    ? 01/01/22 1430  cefTRIAXone (ROCEPHIN) 2 g in sodium chloride 0.9 % 100 mL IVPB       ? 2 g ?200 mL/hr over 30 Minutes Intravenous Every 24 hours 01/01/22 1344 01/09/22 2359  ? 01/01/22 1430  metroNIDAZOLE (FLAGYL) IVPB 500 mg       ? 500 mg ?100 mL/hr over 60 Minutes Intravenous Every  12 hours 01/01/22 1344 01/09/22 1724  ? 01/01/22 1415  piperacillin-tazobactam (ZOSYN) IVPB 3.375 g  Status:  Discontinued       ? 3.375 g ?12.5 mL/hr over 240 Minutes Intravenous Every 8 hours 01/01/22 1329 01/01/22 1344  ? 01/01/22 1400  piperacillin-tazobactam (ZOSYN) IVPB 3.375 g  Status:  Discontinued       ? 3.375 g ?100 mL/hr over 30 Minutes Intravenous Every 8 hours 01/01/22 1309 01/01/22 1328  ? ?  ? ? ?Assessment/Plan: ?POD 8, s/p exploratory laparotomy  with sigmoid colectomy and colostomy on 01/04/22 with Dr. Thermon Leyland for complicated diverticulitis with multiple abscesses not amenable to IR drainage who failed antibiotic treatment  ?- surgical path benign - perforated diverticulitis ?- Soft diet. Boost breeze ?- JP and penrose drains removed 3/28 ?- WOC following for new colostomy ?- continue PT/OT - recommending CIR now.  Awaiting auth ?- WBC still 13K, but also on 20mg  of prednisone so not sure if this is up secondary to steroids or post op. ?- Patient is surgically stable for DC when able medically and has dispo ?-has follow up arranged already and will have SNF remove staples as put in DC instructions ?  ?FEN - soft, Boost ?VTE - Lovenox ?ID - cubicin, for infected hardware ?Foley - removed ?  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 LOS: 11 days  ? ? ?Henreitta Cea ?01/12/2022 ? ?

## 2022-01-12 NOTE — Plan of Care (Signed)

## 2022-01-12 NOTE — H&P (Signed)
? ? ?Physical Medicine and Rehabilitation Admission H&P ? ?  ? ?CC: Debility ? ?HPI: Holly [name] is a 76 year old female who presented to [facility] on 12/31/2021 with complaints of increasing abdominal pain over the course of the previous 6 days.  CT scan of the abdomen and pelvis was significant for sigmoid diverticulitis with likely contained perforation and complex gas and fluid collection.  She was given IV fluids, Tylenol and started on ertapenem.  She was afebrile with a leukocytosis of 14,000 lactic acidosis of 4.3. She was transferred to [facility] for higher level of care.  Infectious disease consultation obtained and antibiotics adjusted to Zosyn. General surgery consultation obtained who agreed with IR consult for percutaneous drainage and conservative therapy. There was no good target for drain placement secondary to multiple, loculated gas pockets.  ? ?Her medical history is significant for NSCLC and recently underwent repair of tibial plateau fracture by Dr. Doreatha Martin on December 12, 2021.  At that time bone biopsies were obtained and were indeterminate for malignancy, however MRSA was isolated.  She underwent infectious disease consultation at that time with Dr. Drucilla Schmidt for osteomyelitis.  PICC placed 12/29/2021 for administration of daptomycin for 3 weeks per Dr. Drucilla Schmidt. To finish on 4/26. (Will likely need suppressive therapy with doxycycline.)  Daptomycin was continued.   ? ?She was closely monitored and on 3/21, Dr. Trisha Mangle evaluated the patient and she had worsening pain.  She was counseled regarding intraoperative repair.  Preoperative WOC consult performed for stoma site marking.  On 3/21, the patient was taken to the operating room where she underwent exploratory laparotomy with sigmoid colectomy and colostomy by Dr. Trisha Mangle. She developed right axillary vein DVT secondary to her PICC line.  This was removed and she was placed on Eliquis dosing for DVT to complete 3 months of  anticoagulation. She will need to maintain PIV and arrange PICC replacement prior to discharge from CIR. she remained hemodynamically stable postoperatively.  She was tolerating clear liquid diet and Boost.  On 3/25, she had a small amount of formed stool and her diet was advanced to full liquids.  Her Foley was discontinued and she was voiding spontaneously.  Her midline Penrose drain and pelvic Jackson-Pratt drains were discontinued on 3/28. The patient requires inpatient medicine and rehabilitation evaluations and services for ongoing dysfunction secondary to recent perforated diverticulum, exploratory laparotomy with colostomy, recent tibial plateau fracture repair. ? ?Her medical history of also significant for COPD on CPAP, ? chronic O2 supplement 3L. She takes prednisone 20 mg daily. History of CKD stage IIIb. Complains of chest pain. ? ?Review of Systems  ?Constitutional:  Negative for chills and fever.  ?HENT:  Negative for congestion and hearing loss.   ?     Irritated oral mucosa s/w thrush  ?Eyes:  Negative for blurred vision and double vision.  ?Respiratory:  Negative for cough and sputum production.   ?Cardiovascular:  Positive for chest pain. Negative for leg swelling.  ?Gastrointestinal:  Negative for abdominal pain, blood in stool, constipation, diarrhea, heartburn, nausea and vomiting.  ?Genitourinary:  Positive for dysuria. Negative for frequency, hematuria and urgency.  ?Musculoskeletal:   ?     Complaining of right foot drop/weakness  ?Skin:   ?     Bullous lesions of right forearm and upper arm c/w subcutaneous hematoma  ?Neurological:  Negative for dizziness and headaches.  ?Psychiatric/Behavioral:  Negative for depression. The patient is not nervous/anxious.   ?Past Medical History:  ?Diagnosis Date  ? Anemia   ?  blood transfusion November 2022  ? Anxiety   ? Asthma   ? Basal cell carcinoma   ? lung cancer.  Skin cancer- basal - nose removed  ? Bruises easily   ? Cataract   ? Cervicalgia   ?  COPD (chronic obstructive pulmonary disease) ([place])   ? emphysema and COPD  ? Depression   ? Dyspnea   ? uses O2 only when needed. Uses 2 L   ? Foot swelling   ? left- has shown to Drs.  ? Frequency of urination   ? Hardware complicating wound infection ([place]) 12/24/2021  ? Headache   ? History of skin cancer   ? 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 Hypercalcemia   ? Hypertension   ? Hypothyroidism   ? Mixed hyperlipidemia   ? MRSA infection 12/24/2021  ? Osteomyelitis of left tibia ([place]) 12/24/2021  ? Pneumonia   ? Pulmonary nodule   ? Sleep apnea   ? pt uses a cpap nightly  ? Thyroid neoplasm   ? Urinary incontinence   ? Wears dentures   ? Wears glasses   ? ?Past Surgical History:  ?Procedure Laterality Date  ? BIOPSY  02/17/2021  ? Procedure: BIOPSY;  Surgeon: Otis Brace, MD;  Location: [facility];  Service: Gastroenterology;;  ? BONE BIOPSY Left 12/13/2021  ? Procedure: BONE BIOPSY;  Surgeon: Shona Needles, MD;  Location: [place];  Service: Orthopedics;  Laterality: Left;  ? COLONOSCOPY    ? COLOSTOMY N/A 01/04/2022  ? Procedure: COLOSTOMY;  Surgeon: Felicie Morn, MD;  Location: [place];  Service: General;  Laterality: N/A;  ? DENTAL SURGERY    ? ELBOW ARTHROPLASTY Right 2020  ? ESOPHAGOGASTRODUODENOSCOPY (EGD) WITH PROPOFOL N/A 02/17/2021  ? Procedure: ESOPHAGOGASTRODUODENOSCOPY (EGD) WITH PROPOFOL;  Surgeon: Otis Brace, MD;  Location: [facility];  Service: Gastroenterology;  Laterality: N/A;  ? EYE SURGERY Right   ? cataract surgery  ? FOOT SURGERY  30 YRS AGO  ? BILATERAL  ? HARDWARE REMOVAL Right 10/02/2020  ? Procedure: HARDWARE REMOVAL RIGHT ELBOW;  Surgeon: Shona Needles, MD;  Location: [place];  Service: Orthopedics;  Laterality: Right;  ? I & D EXTREMITY Right 01/13/2021  ? Procedure: IRRIGATION AND DEBRIDEMENT RIGHT ELBOW;  Surgeon: Shona Needles, MD;  Location: [place];  Service: Orthopedics;  Laterality: Right;  ? LAPAROTOMY N/A 01/04/2022  ? Procedure: EXPLORATORY LAPAROTOMY;  Surgeon: Felicie Morn, MD;  Location: [place];  Service: General;  Laterality: N/A;  ? ORIF ELBOW FRACTURE Right 12/06/2017  ? Procedure: OPEN REDUCTION INTERNAL FIXATION (ORIF) ELBOW/OLECRANON FRACTURE;  Surgeon: Shona Needles, MD;  Location: [place];  Service: Orthopedics;  Laterality: Right;  ? ORIF TIBIA PLATEAU Left 12/13/2021  ? Procedure: OPEN REDUCTION INTERNAL FIXATION (ORIF) TIBIAL PLATEAU;  Surgeon: Shona Needles, MD;  Location: [place];  Service: Orthopedics;  Laterality: Left;  ? PARTIAL COLECTOMY N/A 01/04/2022  ? Procedure: SIGMOID COLECTOMY;  Surgeon: Felicie Morn, MD;  Location: [place];  Service: General;  Laterality: N/A;  ? SKIN CANCER EXCISION    ? THYROIDECTOMY N/A 07/31/2014  ? Procedure: TOTAL THYROIDECTOMY;  Surgeon: Armandina Gemma, MD;  Location: [facility];  Service: General;  Laterality: N/A;  ? VAGINAL HYSTERECTOMY    ? VIDEO BRONCHOSCOPY WITH ENDOBRONCHIAL NAVIGATION Bilateral 06/25/2019  ? Procedure: VIDEO BRONCHOSCOPY WITH ENDOBRONCHIAL NAVIGATION;  Surgeon: Lajuana Matte, MD;  Location: [place];  Service: Thoracic;  Laterality: Bilateral;  ? ?Family History  ?Problem Relation Age of  Onset  ? Coronary artery disease Father   ? Hypertension Father   ? Asthma Father   ? Allergies Father   ? Hypertension Mother   ? Lung cancer Mother   ? ?Social History:  reports that she quit smoking about 6 years ago. Her smoking use included cigarettes. She has a 30.00 pack-year smoking history. She has never used smokeless tobacco. She reports current alcohol use. She reports that she does not use drugs. ?Allergies:  ?Allergies  ?Allergen Reactions  ? Amlodipine Besylate Swelling  ?  Edema  ? Codeine Nausea And Vomiting  ? Oxycodone-Acetaminophen Nausea And Vomiting  ? ?Medications Prior to Admission  ?Medication Sig Dispense Refill  ? acetaminophen (TYLENOL) 650 MG CR tablet Take 650 mg by mouth every 8 (eight) hours as needed for pain.    ? albuterol (PROVENTIL) (2.5 MG/3ML) 0.083% nebulizer solution Take 2.5 mg by  nebulization 3 (three) times daily as needed for wheezing or shortness of breath.    ? ALPRAZolam (XANAX) 0.5 MG tablet Take 0.5 mg by mouth 2 (two) times daily.    ? apixaban (ELIQUIS) 5 MG TABS tablet Take 2 tablet

## 2022-01-12 NOTE — Progress Notes (Signed)
Inpatient Rehab Admissions Coordinator:  ?  ?Pt. To admit to CIR today. RN may call report to [phone] ? ?Clemens Catholic, MS, CCC-SLP ?Rehab Admissions Coordinator  ?[phone] (celll) ?[phone] (office) ? ?

## 2022-01-12 NOTE — Progress Notes (Signed)
PMR Admission Coordinator Pre-Admission Assessment ?  ?Patient: Holly [name] is an 76 y.o., female ?MRN: [id number] ?DOB: 1945-12-07 ?Height: 5' (152.4 cm) ?Weight: 62.1 kg ?  ?Insurance Information ?HMO:     PPO:      PCP:      IPA:      80/20:      OTHER:  ?PRIMARY: Medicare       Policy#: [id number]      Subscriber: Pt. ?Phone#: Verified online    Fax#:  ?Pre-Cert#:       Employer:  ?Benefits:  Phone #:      Name:  ?Eff. Date: Parts A ad B effective 10/17/09  Deduct: $1600      Out of Pocket Max:  None      Life Max: N/A  ?CIR: 100%      SNF: 100 days ?Outpatient: 80%     Co-Pay: 20% ?Home Health: 100%      Co-Pay: none ?DME: 80%     Co-Pay: 20% ?Providers: patient's choice ?Providers: In network ? ?SECONDARY: BCBS Supplement      Policy#: [id number]     Phone#:  ?  ?Financial Counselor:       Phone#:  ?  ?The ?Data Collection Information Summary? for patients in Inpatient Rehabilitation Facilities with attached ?Privacy Act [place] Records? was provided and verbally reviewed with: Patient ?  ?Emergency Contact Information ?Contact Information   ?  ?  Name Relation Home Work Mobile  ?  McDuffy,[name] Daughter [phone]   [phone]  ?  Harris,Rachel Friend [phone]   [phone]  ?  ?   ?  ?  ?Current Medical History  ?Patient Admitting Diagnosis: bowel perforation s/p colectomy/colostomy ?History of Present Illness: Holly [name] is a 76 y.o. female with medical history significant of hypertension, dyslipidemia, COPD on 3 L of oxygen, hypothyroidism, chronic anemia, anxiety, and cancer(melanoma of the nose, basal cell carcinoma, NSCLC, and thyroid neoplasm) presented to [facility] with complaints of abdominal pain.  She had just underwent open reduction and internal fixation of the of the tibial plateau on 2/27 after CT scan revealed tibial plateau fracture with multiple areas of lytic bone lesions.  Interoperatively, biopsies came back negative for malignancy, but were  positive for MRSA.  Patient had been placed on doxycycline for approximately 3 weeks and had PICC line placed 3 days ago to receive IV daptomycin per ID recommendations.  When  patient was initially seen at [facility] 12/31/21  she was  noted to be afebrile with white blood cell count 14, lactic acid 4.3, hemoglobin 8.7 BUN 27, creatinine 1.3, glucose 256, and albumin 3.1.  COVID-19 screening was negative CT scan of the abdomen pelvis significant for sigmoid diverticulitis with likely contained perforation with complex 5.4 x 5.6 cm gas and fluid collection as well as suggestion of a 5.6 x 3.5 cm developing sigmoid organized fluid collection.  Patient was given IV fluids, Tylenol, pain medications, started on ertapenem. [facility] further treatment . She underwent sigmoid colectomy ad colostomy 01/04/22. She developed some anemia 3/27 and received one unit of PRBCs and RUE venous Doppler on 3/28 was significant for axillary DVT. Pt. Was recommended to CIR to assist return to PLOF.  ?  ?Patient's medical record from [facility] has been reviewed by the rehabilitation admission coordinator and physician. ?  ?Past Medical History  ?    ?Past Medical History:  ?Diagnosis Date  ?  Anemia    ?  blood transfusion November 2022  ? Anxiety    ? Asthma    ? Basal cell carcinoma    ?  lung cancer.  Skin cancer- basal - nose removed  ? Bruises easily    ? Cataract    ? Cervicalgia    ? COPD (chronic obstructive pulmonary disease) ([place])    ?  emphysema and COPD  ? Depression    ? Dyspnea    ?  uses O2 only when needed. Uses 2 L   ? Foot swelling    ?  left- has shown to Drs.  ? Frequency of urination    ? Hardware complicating wound infection ([place]) 12/24/2021  ? Headache    ? History of skin cancer    ?  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 Hypercalcemia    ? Hypertension    ? Hypothyroidism    ? Mixed hyperlipidemia    ? MRSA infection 12/24/2021  ? Osteomyelitis of left tibia ([place])  12/24/2021  ? Pneumonia    ? Pulmonary nodule    ? Sleep apnea    ?  pt uses a cpap nightly  ? Thyroid neoplasm    ? Urinary incontinence    ? Wears dentures    ? Wears glasses    ?  ?  ?Has the patient had major surgery during 100 days prior to admission? Yes ?  ?Family History   ?family history includes Allergies in her father; Asthma in her father; Coronary artery disease in her father; Hypertension in her father and mother; Lung cancer in her mother. ?  ?Current Medications ?  ?Current Facility-Administered Medications:  ?  0.9 %  sodium chloride infusion (Manually program via Guardrails IV Fluids), , Intravenous, Once, Meuth, Brooke A, PA-C ?  acetaminophen (TYLENOL) tablet 1,000 mg, 1,000 mg, Oral, Q6H, Meuth, Brooke A, PA-C, 1,000 mg at 01/12/22 2956 ?  albuterol (PROVENTIL) (2.5 MG/3ML) 0.083% nebulizer solution 2.5 mg, 2.5 mg, Nebulization, Q6H PRN, Meuth, Brooke A, PA-C ?  ALPRAZolam Duanne Moron) tablet 0.5 mg, 0.5 mg, Oral, TID PRN, Elgergawy, Silver Huguenin, MD, 0.5 mg at 01/10/22 1603 ?  atorvastatin (LIPITOR) tablet 20 mg, 20 mg, Oral, QHS, Meuth, Brooke A, PA-C, 20 mg at 01/11/22 2201 ?  buPROPion (WELLBUTRIN XL) 24 hr tablet 300 mg, 300 mg, Oral, q morning, Meuth, Brooke A, PA-C, 300 mg at 01/11/22 1042 ?  busPIRone (BUSPAR) tablet 5 mg, 5 mg, Oral, BID, Meuth, Brooke A, PA-C, 5 mg at 01/11/22 2201 ?  chlorhexidine (PERIDEX) 0.12 % solution 15 mL, 15 mL, Mouth Rinse, BID, Meuth, Brooke A, PA-C, 15 mL at 01/11/22 2200 ?  DAPTOmycin (CUBICIN) 500 mg in sodium chloride 0.9 % IVPB, 8 mg/kg, Intravenous, Q2000, Meuth, Brooke A, PA-C, Last Rate: 120 mL/hr at 01/11/22 2104, 500 mg at 01/11/22 2104 ?  donepezil (ARICEPT) tablet 10 mg, 10 mg, Oral, QHS, Meuth, Brooke A, PA-C, 10 mg at 01/11/22 2200 ?  enoxaparin (LOVENOX) injection 60 mg, 60 mg, Subcutaneous, Q12H, Einar Grad, RPH, 60 mg at 01/12/22 0444 ?  feeding supplement (BOOST / RESOURCE BREEZE) liquid 1 Container, 1 Container, Oral, BID BM, Elgergawy,  Silver Huguenin, MD, 1 Container at 01/11/22 1039 ?  feeding supplement (ENSURE ENLIVE / ENSURE PLUS) liquid 237 mL, 237 mL, Oral, BID BM, Elgergawy, Silver Huguenin, MD, 237 mL at 01/11/22 1243 ?  fluticasone furoate-vilanterol (BREO ELLIPTA) 100-25 MCG/ACT 1 puff, 1 puff, Inhalation, Daily, 1 puff at 01/11/22 0810 **AND**  umeclidinium bromide (INCRUSE ELLIPTA) 62.5 MCG/ACT 1 puff, 1 puff, Inhalation, Daily, Meuth, Brooke A, PA-C, 1 puff at 01/11/22 0810 ?  HYDROmorphone (DILAUDID) injection 0.5 mg, 0.5 mg, Intravenous, Q6H PRN, Elgergawy, Silver Huguenin, MD, 0.5 mg at 01/12/22 0444 ?  levothyroxine (SYNTHROID) tablet 137 mcg, 137 mcg, Oral, Q0600, Meuth, Brooke A, PA-C, 137 mcg at 01/12/22 4540 ?  MEDLINE mouth rinse, 15 mL, Mouth Rinse, q12n4p, Meuth, Brooke A, PA-C, 15 mL at 01/11/22 1719 ?  methocarbamol (ROBAXIN) tablet 500 mg, 500 mg, Oral, Q6H, Meuth, Brooke A, PA-C, 500 mg at 01/12/22 0443 ?  metoprolol succinate (TOPROL-XL) 24 hr tablet 50 mg, 50 mg, Oral, Daily, Elgergawy, Silver Huguenin, MD, 50 mg at 01/11/22 1038 ?  mirtazapine (REMERON) tablet 30 mg, 30 mg, Oral, QHS, Meuth, Brooke A, PA-C, 30 mg at 01/11/22 2201 ?  multivitamin with minerals tablet 1 tablet, 1 tablet, Oral, Daily, Meuth, Brooke A, PA-C, 1 tablet at 01/11/22 1038 ?  ondansetron (ZOFRAN) injection 4 mg, 4 mg, Intravenous, Q6H PRN, Jonetta Osgood, MD, 4 mg at 01/04/22 2001 ?  ondansetron (ZOFRAN-ODT) disintegrating tablet 4 mg, 4 mg, Oral, Q6H PRN, Meuth, Brooke A, PA-C ?  oxyCODONE (Oxy IR/ROXICODONE) immediate release tablet 5-10 mg, 5-10 mg, Oral, Q4H PRN, Meuth, Brooke A, PA-C, 10 mg at 01/12/22 9811 ?  pantoprazole (PROTONIX) EC tablet 40 mg, 40 mg, Oral, BID, Meuth, Brooke A, PA-C, 40 mg at 01/11/22 2201 ?  predniSONE (DELTASONE) tablet 20 mg, 20 mg, Oral, Daily, Meuth, Brooke A, PA-C, 20 mg at 01/11/22 1038 ?  prochlorperazine (COMPAZINE) injection 10 mg, 10 mg, Intravenous, Q6H PRN, Meuth, Brooke A, PA-C ?  sodium chloride flush (NS) 0.9 % injection 3  mL, 3 mL, Intravenous, Q12H, Meuth, Brooke A, PA-C, 3 mL at 01/11/22 2202 ?  ?Patients Current Diet:  ?Diet Order   ?  ?         ?    DIET SOFT Room service appropriate? Yes; Fluid consistency: Thin  Diet effe

## 2022-01-12 NOTE — Progress Notes (Signed)
Arrived to the unit free of complications. Oriented to unit and assigned room.  ?Admission complete ? ? ?Yehuda Mao, LPN ?

## 2022-01-12 NOTE — Progress Notes (Deleted)
PMR Admission Coordinator Pre-Admission Assessment ?  ?Patient: Holly [name] is an 76 y.o., female ?MRN: [id number] ?DOB: Nov 24, 1945 ?Height: 5' (152.4 cm) ?Weight: 62.1 kg ?  ?Insurance Information ?HMO:     PPO:      PCP:      IPA:      80/20:      OTHER:  ?PRIMARY: Medicare       Policy#: [id number]      Subscriber: Pt. ?Phone#: Verified online    Fax#:  ?Pre-Cert#:       Employer:  ?Benefits:  Phone #:      Name:  ?Eff. Date: Parts A ad B effective 10/17/09  Deduct: $1600      Out of Pocket Max:  None      Life Max: N/A  ?CIR: 100%      SNF: 100 days ?Outpatient: 80%     Co-Pay: 20% ?Home Health: 100%      Co-Pay: none ?DME: 80%     Co-Pay: 20% ?Providers: patient's choice ?Providers: In network ? ?SECONDARY: BCBS Supplement      Policy#: [id number]     Phone#:  ?  ?Financial Counselor:       Phone#:  ?  ?The ?Data Collection Information Summary? for patients in Inpatient Rehabilitation Facilities with attached ?Privacy Act [place] Records? was provided and verbally reviewed with: Patient ?  ?Emergency Contact Information ?Contact Information   ?  ?  Name Relation Home Work Mobile  ?  McDuffy,[name] Daughter [phone]   [phone]  ?  Harris,Rachel Friend [phone]   [phone]  ?  ?   ?  ?  ?Current Medical History  ?Patient Admitting Diagnosis: bowel perforation s/p colectomy/colostomy ?History of Present Illness: Holly [name] is a 76 y.o. female with medical history significant of hypertension, dyslipidemia, COPD on 3 L of oxygen, hypothyroidism, chronic anemia, anxiety, and cancer(melanoma of the nose, basal cell carcinoma, NSCLC, and thyroid neoplasm) presented to [facility] with complaints of abdominal pain.  She had just underwent open reduction and internal fixation of the of the tibial plateau on 2/27 after CT scan revealed tibial plateau fracture with multiple areas of lytic bone lesions.  Interoperatively, biopsies came back negative for malignancy, but were  positive for MRSA.  Patient had been placed on doxycycline for approximately 3 weeks and had PICC line placed 3 days ago to receive IV daptomycin per ID recommendations.  When  patient was initially seen at [facility] 12/31/21  she was  noted to be afebrile with white blood cell count 14, lactic acid 4.3, hemoglobin 8.7 BUN 27, creatinine 1.3, glucose 256, and albumin 3.1.  COVID-19 screening was negative CT scan of the abdomen pelvis significant for sigmoid diverticulitis with likely contained perforation with complex 5.4 x 5.6 cm gas and fluid collection as well as suggestion of a 5.6 x 3.5 cm developing sigmoid organized fluid collection.  Patient was given IV fluids, Tylenol, pain medications, started on ertapenem. She was transferred to [facility] 01/01/22 for further treatment . She underwent sigmoid colectomy ad colostomy 01/04/22. She developed some anemia 3/27 and received one unit of PRBCs and RUE venous Doppler on 3/28 was significant for axillary DVT. Pt. Was recommended to CIR to assist return to PLOF.  ?  ?Patient's medical record from [facility] has been reviewed by the rehabilitation admission coordinator and physician. ?  ?Past Medical History  ?    ?Past Medical History:  ?Diagnosis Date  ?  Anemia    ?  blood transfusion November 2022  ? Anxiety    ? Asthma    ? Basal cell carcinoma    ?  lung cancer.  Skin cancer- basal - nose removed  ? Bruises easily    ? Cataract    ? Cervicalgia    ? COPD (chronic obstructive pulmonary disease) ([place])    ?  emphysema and COPD  ? Depression    ? Dyspnea    ?  uses O2 only when needed. Uses 2 L   ? Foot swelling    ?  left- has shown to Drs.  ? Frequency of urination    ? Hardware complicating wound infection ([place]) 12/24/2021  ? Headache    ? History of skin cancer    ?  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 Hypercalcemia    ? Hypertension    ? Hypothyroidism    ? Mixed hyperlipidemia    ? MRSA infection 12/24/2021  ? Osteomyelitis of left tibia ([place])  12/24/2021  ? Pneumonia    ? Pulmonary nodule    ? Sleep apnea    ?  pt uses a cpap nightly  ? Thyroid neoplasm    ? Urinary incontinence    ? Wears dentures    ? Wears glasses    ?  ?  ?Has the patient had major surgery during 100 days prior to admission? Yes ?  ?Family History   ?family history includes Allergies in her father; Asthma in her father; Coronary artery disease in her father; Hypertension in her father and mother; Lung cancer in her mother. ?  ?Current Medications ?  ?Current Facility-Administered Medications:  ?  0.9 %  sodium chloride infusion (Manually program via Guardrails IV Fluids), , Intravenous, Once, Meuth, Brooke A, PA-C ?  acetaminophen (TYLENOL) tablet 1,000 mg, 1,000 mg, Oral, Q6H, Meuth, Brooke A, PA-C, 1,000 mg at 01/12/22 6237 ?  albuterol (PROVENTIL) (2.5 MG/3ML) 0.083% nebulizer solution 2.5 mg, 2.5 mg, Nebulization, Q6H PRN, Meuth, Brooke A, PA-C ?  ALPRAZolam Duanne Moron) tablet 0.5 mg, 0.5 mg, Oral, TID PRN, Elgergawy, Silver Huguenin, MD, 0.5 mg at 01/10/22 1603 ?  atorvastatin (LIPITOR) tablet 20 mg, 20 mg, Oral, QHS, Meuth, Brooke A, PA-C, 20 mg at 01/11/22 2201 ?  buPROPion (WELLBUTRIN XL) 24 hr tablet 300 mg, 300 mg, Oral, q morning, Meuth, Brooke A, PA-C, 300 mg at 01/11/22 1042 ?  busPIRone (BUSPAR) tablet 5 mg, 5 mg, Oral, BID, Meuth, Brooke A, PA-C, 5 mg at 01/11/22 2201 ?  chlorhexidine (PERIDEX) 0.12 % solution 15 mL, 15 mL, Mouth Rinse, BID, Meuth, Brooke A, PA-C, 15 mL at 01/11/22 2200 ?  DAPTOmycin (CUBICIN) 500 mg in sodium chloride 0.9 % IVPB, 8 mg/kg, Intravenous, Q2000, Meuth, Brooke A, PA-C, Last Rate: 120 mL/hr at 01/11/22 2104, 500 mg at 01/11/22 2104 ?  donepezil (ARICEPT) tablet 10 mg, 10 mg, Oral, QHS, Meuth, Brooke A, PA-C, 10 mg at 01/11/22 2200 ?  enoxaparin (LOVENOX) injection 60 mg, 60 mg, Subcutaneous, Q12H, Einar Grad, RPH, 60 mg at 01/12/22 0444 ?  feeding supplement (BOOST / RESOURCE BREEZE) liquid 1 Container, 1 Container, Oral, BID BM, Elgergawy,  Silver Huguenin, MD, 1 Container at 01/11/22 1039 ?  feeding supplement (ENSURE ENLIVE / ENSURE PLUS) liquid 237 mL, 237 mL, Oral, BID BM, Elgergawy, Silver Huguenin, MD, 237 mL at 01/11/22 1243 ?  fluticasone furoate-vilanterol (BREO ELLIPTA) 100-25 MCG/ACT 1 puff, 1 puff, Inhalation, Daily, 1 puff at 01/11/22 0810 **AND**  umeclidinium bromide (INCRUSE ELLIPTA) 62.5 MCG/ACT 1 puff, 1 puff, Inhalation, Daily, Meuth, Brooke A, PA-C, 1 puff at 01/11/22 0810 ?  HYDROmorphone (DILAUDID) injection 0.5 mg, 0.5 mg, Intravenous, Q6H PRN, Elgergawy, Silver Huguenin, MD, 0.5 mg at 01/12/22 0444 ?  levothyroxine (SYNTHROID) tablet 137 mcg, 137 mcg, Oral, Q0600, Meuth, Brooke A, PA-C, 137 mcg at 01/12/22 8675 ?  MEDLINE mouth rinse, 15 mL, Mouth Rinse, q12n4p, Meuth, Brooke A, PA-C, 15 mL at 01/11/22 1719 ?  methocarbamol (ROBAXIN) tablet 500 mg, 500 mg, Oral, Q6H, Meuth, Brooke A, PA-C, 500 mg at 01/12/22 0443 ?  metoprolol succinate (TOPROL-XL) 24 hr tablet 50 mg, 50 mg, Oral, Daily, Elgergawy, Silver Huguenin, MD, 50 mg at 01/11/22 1038 ?  mirtazapine (REMERON) tablet 30 mg, 30 mg, Oral, QHS, Meuth, Brooke A, PA-C, 30 mg at 01/11/22 2201 ?  multivitamin with minerals tablet 1 tablet, 1 tablet, Oral, Daily, Meuth, Brooke A, PA-C, 1 tablet at 01/11/22 1038 ?  ondansetron (ZOFRAN) injection 4 mg, 4 mg, Intravenous, Q6H PRN, Jonetta Osgood, MD, 4 mg at 01/04/22 2001 ?  ondansetron (ZOFRAN-ODT) disintegrating tablet 4 mg, 4 mg, Oral, Q6H PRN, Meuth, Brooke A, PA-C ?  oxyCODONE (Oxy IR/ROXICODONE) immediate release tablet 5-10 mg, 5-10 mg, Oral, Q4H PRN, Meuth, Brooke A, PA-C, 10 mg at 01/12/22 4492 ?  pantoprazole (PROTONIX) EC tablet 40 mg, 40 mg, Oral, BID, Meuth, Brooke A, PA-C, 40 mg at 01/11/22 2201 ?  predniSONE (DELTASONE) tablet 20 mg, 20 mg, Oral, Daily, Meuth, Brooke A, PA-C, 20 mg at 01/11/22 1038 ?  prochlorperazine (COMPAZINE) injection 10 mg, 10 mg, Intravenous, Q6H PRN, Meuth, Brooke A, PA-C ?  sodium chloride flush (NS) 0.9 % injection 3  mL, 3 mL, Intravenous, Q12H, Meuth, Brooke A, PA-C, 3 mL at 01/11/22 2202 ?  ?Patients Current Diet:  ?Diet Order   ?  ?         ?    DIET SOFT Room service appropriate? Yes; Fluid consistency: Thin  Diet effe

## 2022-01-12 NOTE — Progress Notes (Signed)
Occupational Therapy Treatment ?Patient Details ?Name: Holly [name] ?MRN: [id number] ?DOB: 1946-09-22 ?Today's Date: 01/12/2022 ? ? ?History of present illness Pt is a 76 y/o female presenting on 3/18 from [facility] for abdominal pain.  Found with sigmoid diverticulitis with contained perforation and abscess formation. 3/21 SIGMOID COLECTOMY AND COLOSTOMY    PMH includes:  L tibial plateau ORIF on 2/27 (osteomyelitis). ?malignancy; anemia, basal cell carcinoma/lung cancer, COPD on 3L O2, HTN, PNA, CKD. ?  ?OT comments ? Pt currently needing min assist for sit to stand and mod assist for transfers to the [facility] simulated with use of the RW for support.  Still with limited endurance with O2 sats decreasing down to 87% on 3Ls nasal cannula and with HR increasing up into the upper 120s with activity.  Max assist for completion of toileting tasks in standing as well.  Feel she will continue to benefit from acute care OT to help increase endurance and ADL independence.  Recommend more intense rehab at AIR level now that pt is improving with tolerance.    ? ?Recommendations for follow up therapy are one component of a multi-disciplinary discharge planning process, led by the attending physician.  Recommendations may be updated based on patient status, additional functional criteria and insurance authorization. ?   ?Follow Up Recommendations ? Acute inpatient rehab (3hours/day)  ?  ?Assistance Recommended at Discharge Frequent or constant Supervision/Assistance  ?Patient can return home with the following ? A lot of help with walking and/or transfers;A lot of help with bathing/dressing/bathroom;Assistance with cooking/housework;Direct supervision/assist for medications management;Assist for transportation ?  ?Equipment Recommendations ? Other (comment) (TBD next venue of care)  ?  ?   ?Precautions / Restrictions Precautions ?Precautions: Fall ?Precaution Comments: colostomy; rt quadrant drain;  recent left tibia  fx ?Restrictions ?Weight Bearing Restrictions: Yes ?LLE Weight Bearing: Weight bearing as tolerated ?Other Position/Activity Restrictions: WBAT for transfers only  ? ? ?  ? ?Mobility Bed Mobility ?Overal bed mobility: Needs Assistance ?Bed Mobility: Rolling ?Rolling: Min assist ?Sidelying to sit: HOB elevated, Min assist ?  ?  ?  ?  ?  ? ?Transfers ?Overall transfer level: Needs assistance ?Equipment used: Rolling walker (2 wheels) ?Transfers: Sit to/from Stand ?Sit to Stand: Min assist, From elevated surface ?Stand pivot transfers: Mod assist ?  ?Step pivot transfers: Mod assist ?  ?  ?General transfer comment: Pt completed stand pivot with the RW.  Slight increased anxiety as well as increased trunk flexion in standing.  Oxygen sats decreasing to 87% on 2Ls nasal cannula. ?  ?  ?Balance Overall balance assessment: Needs assistance ?  ?Sitting balance-Leahy Scale: Good ?  ?  ?Standing balance support: Bilateral upper extremity supported, Reliant on assistive device for balance ?Standing balance-Leahy Scale: Zero ?Standing balance comment: Therapist assist as well as assistive device when standing. ?  ?  ?  ?  ?  ?  ?  ?  ?  ?  ?  ?   ? ?ADL either performed or assessed with clinical judgement  ? ?ADL Overall ADL's : Needs assistance/impaired ?  ?  ?  ?  ?  ?  ?  ?  ?  ?  ?  ?  ?Toilet Transfer: Moderate assistance ?Toilet Transfer Details (indicate cue type and reason): simulated to the bedside chair.  Pt with purewick and colostomy ?Toileting- Clothing Manipulation and Hygiene: Maximal assistance ?Toileting - Clothing Manipulation Details (indicate cue type and reason): standing to complete peri hygiene ?  ?  ?  Functional mobility during ADLs: Rolling walker (2 wheels);Moderate assistance ?General ADL Comments: Pt able to complete sit to stand for toileitng tasks and for transfer to the bedside recliner.  Mod assist was needed for stand pivot secondary to not being able to efficiently weightbear over the LLE and  step with the RLE. ?  ? ? ?   ?   ?   ? ?Cognition Arousal/Alertness: Awake/alert ?Behavior During Therapy: [facility] for tasks assessed/performed ?Overall Cognitive Status: Within Functional Limits for tasks assessed ?  ?  ?  ?  ?  ?  ?  ?  ?  ?  ?  ?  ?  ?  ?  ?  ?  ?  ?  ?   ?   ?   ?   ? ? ?Pertinent Vitals/ Pain       Pain Assessment ?Pain Assessment: No/denies pain ? ?   ?   ? ?Frequency ? Min 2X/week  ? ? ? ? ?  ?Progress Toward Goals ? ?OT Goals(current goals can now be found in the care plan section) ? Progress towards OT goals: Progressing toward goals ? ?Acute Rehab OT Goals ?Patient Stated Goal: Go to rehab later today ?OT Goal Formulation: With patient ?Potential to Achieve Goals: Good  ?Plan Discharge plan needs to be updated   ? ?   ?AM-PAC OT "6 Clicks" Daily Activity     ?Outcome Measure ? ? Help from another person eating meals?: None ?Help from another person taking care of personal grooming?: A Little ?Help from another person toileting, which includes using toliet, bedpan, or urinal?: A Lot ?Help from another person bathing (including washing, rinsing, drying)?: A Lot ?Help from another person to put on and taking off regular upper body clothing?: A Little ?Help from another person to put on and taking off regular lower body clothing?: A Lot ?6 Click Score: 16 ? ?  ?End of Session Equipment Utilized During Treatment: Oxygen;Other (comment);Rolling walker (2 wheels) ? ?OT Visit Diagnosis: Muscle weakness (generalized) (M62.81);Unsteadiness on feet (R26.81) ?  ?Activity Tolerance Patient limited by fatigue ?  ?Patient Left in bed;with call bell/phone within reach;Other (comment) (PT in to take over for next session.) ?  ?Nurse Communication Other (comment) (right wound site draining and in need of change) ?  ? ?   ? ?Time: 4270-6237 ?OT Time Calculation (min): 33 min ? ?Charges: OT General Charges ?$OT Visit: 1 Visit ?OT Treatments ?$Self Care/Home Management : 23-37 mins ? ?Khrista Braun  OTR/L ?01/12/2022, 10:39 AM ?

## 2022-01-12 NOTE — H&P (Incomplete)
? ? ?Physical Medicine and Rehabilitation Admission H&P ? ?  ? ?CC:  ? ?HPI: Holly [name] is a 76 year old female who presented to [facility] on 12/31/2021 with complaints of increasing abdominal pain over the course of the previous 6 days.  CT scan of the abdomen and pelvis was significant for sigmoid diverticulitis with likely contained perforation and complex gas and fluid collection.  She was given IV fluids, Tylenol and started on ertapenem.  She was afebrile with a leukocytosis of 14,000 lactic acidosis of 4.3. She was transferred to [facility] for higher level of care.  Infectious disease consultation obtained and antibiotics adjusted to Zosyn. General surgery consultation obtained who agreed with IR consult for percutaneous drainage and conservative therapy. There was no good target for drain placement secondary to multiple, loculated gas pockets.  ? ?Her medical history is significant for NSCLC and recently underwent repair of tibial plateau fracture by Dr. Doreatha Martin on December 12, 2021.  At that time bone biopsies were obtained and were indeterminate for malignancy, however MRSA was isolated.  She underwent infectious disease consultation at that time with Dr. Drucilla Schmidt for osteomyelitis.  PICC placed 12/29/2021 for administration of daptomycin for 3 weeks per Dr. Drucilla Schmidt. To finish on 4/26. (Will likely need suppressive therapy with doxycycline.)  Daptomycin was continued.   ? ?She was closely monitored and on 3/21, Dr. Trisha Mangle evaluated the patient and she had worsening pain.  She was counseled regarding intraoperative repair.  Preoperative WOC consult performed for stoma site marking.  On 3/21, the patient was taken to the operating room where she underwent exploratory laparotomy with sigmoid colectomy and colostomy by Dr. Trisha Mangle. She developed right axillary vein DVT secondary to her PICC line.  This was removed and she was placed on Eliquis dosing for DVT to complete 3 months of anticoagulation. She  will need to maintain PIV and arrange PICC replacement prior to discharge from CIR. she remained hemodynamically stable postoperatively.  She was tolerating clear liquid diet and Boost.  On 3/25, she had a small amount of formed stool and her diet was advanced to full liquids.  Her Foley was discontinued and she was voiding spontaneously.  Her midline Penrose drain and pelvic Jackson-Pratt drains were discontinued on 3/28. The patient requires inpatient medicine and rehabilitation evaluations and services for ongoing dysfunction secondary to recent perforated diverticulum, exploratory laparotomy with colostomy, recent tibial plateau fracture repair. ? ?Her medical history of also significant for COPD on CPAP, ? chronic O2 supplement 3L. She takes prednisone 20 mg daily. History of CKD stage IIIb. ? ?Review of Systems  ?Constitutional:  Negative for chills and fever.  ?HENT:  Negative for congestion and hearing loss.   ?     Irritated oral mucosa s/w thrush  ?Eyes:  Negative for blurred vision and double vision.  ?Respiratory:  Negative for cough and sputum production.   ?Cardiovascular:  Positive for chest pain. Negative for leg swelling.  ?Gastrointestinal:  Negative for abdominal pain, blood in stool, constipation, diarrhea, heartburn, nausea and vomiting.  ?Genitourinary:  Positive for dysuria. Negative for frequency, hematuria and urgency.  ?Musculoskeletal:   ?     Complaining of right foot drop/weakness  ?Skin:   ?     Bullous lesions of right forearm and upper arm c/w subcutaneous hematoma  ?Neurological:  Negative for dizziness and headaches.  ?Psychiatric/Behavioral:  Negative for depression. The patient is not nervous/anxious.   ?Past Medical History:  ?Diagnosis Date  ? Anemia   ? blood transfusion  November 2022  ? Anxiety   ? Asthma   ? Basal cell carcinoma   ? lung cancer.  Skin cancer- basal - nose removed  ? Bruises easily   ? Cataract   ? Cervicalgia   ? COPD (chronic obstructive pulmonary disease)  ([place])   ? emphysema and COPD  ? Depression   ? Dyspnea   ? uses O2 only when needed. Uses 2 L   ? Foot swelling   ? left- has shown to Drs.  ? Frequency of urination   ? Hardware complicating wound infection ([place]) 12/24/2021  ? Headache   ? History of skin cancer   ? 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 Hypercalcemia   ? Hypertension   ? Hypothyroidism   ? Mixed hyperlipidemia   ? MRSA infection 12/24/2021  ? 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 Pneumonia   ? Pulmonary nodule   ? Sleep apnea   ? pt uses a cpap nightly  ? Thyroid neoplasm   ? Urinary incontinence   ? Wears dentures   ? Wears glasses   ? ?Past Surgical History:  ?Procedure Laterality Date  ? BIOPSY  02/17/2021  ? Procedure: BIOPSY;  Surgeon: Otis Brace, MD;  Location: [place] [facility];  Service: Gastroenterology;;  ? BONE BIOPSY Left 12/13/2021  ? Procedure: BONE BIOPSY;  Surgeon: Shona Needles, MD;  Location: [place];  Service: Orthopedics;  Laterality: Left;  ? COLONOSCOPY    ? COLOSTOMY N/A 01/04/2022  ? Procedure: COLOSTOMY;  Surgeon: Felicie Morn, MD;  Location: [place];  Service: General;  Laterality: N/A;  ? DENTAL SURGERY    ? ELBOW ARTHROPLASTY Right 2020  ? ESOPHAGOGASTRODUODENOSCOPY (EGD) WITH PROPOFOL N/A 02/17/2021  ? Procedure: ESOPHAGOGASTRODUODENOSCOPY (EGD) WITH PROPOFOL;  Surgeon: Otis Brace, MD;  Location: [facility];  Service: Gastroenterology;  Laterality: N/A;  ? EYE SURGERY Right   ? cataract surgery  ? FOOT SURGERY  30 YRS AGO  ? BILATERAL  ? HARDWARE REMOVAL Right 10/02/2020  ? Procedure: HARDWARE REMOVAL RIGHT ELBOW;  Surgeon: Shona Needles, MD;  Location: [place];  Service: Orthopedics;  Laterality: Right;  ? I & D EXTREMITY Right 01/13/2021  ? Procedure: IRRIGATION AND DEBRIDEMENT RIGHT ELBOW;  Surgeon: Shona Needles, MD;  Location: [place];  Service: Orthopedics;  Laterality: Right;  ? LAPAROTOMY N/A 01/04/2022  ? Procedure: EXPLORATORY LAPAROTOMY;  Surgeon: Felicie Morn, MD;  Location: [place];  Service:  General;  Laterality: N/A;  ? ORIF ELBOW FRACTURE Right 12/06/2017  ? Procedure: OPEN REDUCTION INTERNAL FIXATION (ORIF) ELBOW/OLECRANON FRACTURE;  Surgeon: Shona Needles, MD;  Location: [place];  Service: Orthopedics;  Laterality: Right;  ? ORIF TIBIA PLATEAU Left 12/13/2021  ? Procedure: OPEN REDUCTION INTERNAL FIXATION (ORIF) TIBIAL PLATEAU;  Surgeon: Shona Needles, MD;  Location: [place];  Service: Orthopedics;  Laterality: Left;  ? PARTIAL COLECTOMY N/A 01/04/2022  ? Procedure: SIGMOID COLECTOMY;  Surgeon: Felicie Morn, MD;  Location: [place];  Service: General;  Laterality: N/A;  ? SKIN CANCER EXCISION    ? THYROIDECTOMY N/A 07/31/2014  ? Procedure: TOTAL THYROIDECTOMY;  Surgeon: Armandina Gemma, MD;  Location: [facility];  Service: General;  Laterality: N/A;  ? VAGINAL HYSTERECTOMY    ? VIDEO BRONCHOSCOPY WITH ENDOBRONCHIAL NAVIGATION Bilateral 06/25/2019  ? Procedure: VIDEO BRONCHOSCOPY WITH ENDOBRONCHIAL NAVIGATION;  Surgeon: Lajuana Matte, MD;  Location: [place];  Service: Thoracic;  Laterality: Bilateral;  ? ?Family History  ?Problem Relation Age of Onset  ?  Coronary artery disease Father   ? Hypertension Father   ? Asthma Father   ? Allergies Father   ? Hypertension Mother   ? Lung cancer Mother   ? ?Social History:  reports that she quit smoking about 6 years ago. Her smoking use included cigarettes. She has a 30.00 pack-year smoking history. She has never used smokeless tobacco. She reports current alcohol use. She reports that she does not use drugs. ?Allergies:  ?Allergies  ?Allergen Reactions  ? Amlodipine Besylate Swelling  ?  Edema  ? Codeine Nausea And Vomiting  ? Oxycodone-Acetaminophen Nausea And Vomiting  ? ?Medications Prior to Admission  ?Medication Sig Dispense Refill  ? acetaminophen (TYLENOL) 650 MG CR tablet Take 650 mg by mouth every 8 (eight) hours as needed for pain.    ? albuterol (PROVENTIL) (2.5 MG/3ML) 0.083% nebulizer solution Take 2.5 mg by nebulization 3 (three) times daily as  needed for wheezing or shortness of breath.    ? ALPRAZolam (XANAX) 0.5 MG tablet Take 0.5 mg by mouth 2 (two) times daily.    ? aspirin 325 MG tablet Take 1 tablet (325 mg total) by mouth in the morning and at

## 2022-01-12 NOTE — Progress Notes (Signed)
ANTICOAGULATION CONSULT NOTE ? ?Pharmacy Consult for apixaban (transitioned from enoxaparin) ?Indication: DVT ? ?Allergies  ?Allergen Reactions  ? Amlodipine Besylate Swelling  ?  Edema  ? Codeine Nausea And Vomiting  ? Oxycodone-Acetaminophen Nausea And Vomiting  ? ? ?Patient Measurements: ?Height: 5' (152.4 cm) ?Weight: 62.1 kg (137 lb) ?IBW/kg (Calculated) : 45.5 ? ?Vital Signs: ?Temp: 98.3 ?F (36.8 ?C) (03/29 0740) ?Temp Source: Oral (03/29 0740) ?BP: 161/65 (03/29 0740) ?Pulse Rate: 72 (03/29 0740) ? ?Labs: ?Recent Labs  ?  01/10/22 ?0421 01/11/22 ?0145 01/12/22 ?0345  ?HGB 7.8* 9.1* 10.3*  ?HCT 26.8* 29.8* 34.1*  ?PLT 239 233 219  ?CREATININE 0.59 0.74 0.66  ? ? ? ?Estimated Creatinine Clearance: 48.4 mL/min (by C-G formula based on SCr of 0.66 mg/dL). ? ? ?Medical History: ?Past Medical History:  ?Diagnosis Date  ? Anemia   ? blood transfusion November 2022  ? Anxiety   ? Asthma   ? Basal cell carcinoma   ? lung cancer.  Skin cancer- basal - nose removed  ? Bruises easily   ? Cataract   ? Cervicalgia   ? COPD (chronic obstructive pulmonary disease) ([place])   ? emphysema and COPD  ? Depression   ? Dyspnea   ? uses O2 only when needed. Uses 2 L   ? Foot swelling   ? left- has shown to Drs.  ? Frequency of urination   ? Hardware complicating wound infection ([place]) 12/24/2021  ? Headache   ? History of skin cancer   ? 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 Hypercalcemia   ? Hypertension   ? Hypothyroidism   ? Mixed hyperlipidemia   ? MRSA infection 12/24/2021  ? Osteomyelitis of left tibia ([place]) 12/24/2021  ? Pneumonia   ? Pulmonary nodule   ? Sleep apnea   ? pt uses a cpap nightly  ? Thyroid neoplasm   ? Urinary incontinence   ? Wears dentures   ? Wears glasses   ? ? ?Assessment: ?67 yoF s/p ex lap and colostomy 3/21. Pt has been on DVT prophylaxis, now with RUE DVT. Pt started on lovenox 1 mg/kg [place] q12h on 01/11/2022.  Transitioned to apixaban (treatment doses). Patient received last dose of lovenox on 01/12/2022 at 04:44 ? ?Goal of  Therapy:  ?Monitor platelets by anticoagulation protocol: Yes ?  ?Plan:  ?Discontinued Lovenox ?Start Apixaban 10 mg po bid x7 days followed by 5 mg po bid thereafter ?The first dose of Apixaban 10 mg will be given at 17:00 on 01/12/2022 ?Her first 5 mg dose will start on 01/19/2022 at 20:00 ?Follow CBC, Cr ? ?Pharmacy will sign off on the consult but continue to follow patient and make recommendations as needed ? ? ?Malkia Nippert BS, PharmD, BCPS ?Clinical Pharmacist ?01/12/2022 8:56 AM ? ? ?

## 2022-01-13 DIAGNOSIS — R5381 Other malaise: Secondary | ICD-10-CM | POA: Diagnosis not present

## 2022-01-13 LAB — URINALYSIS, ROUTINE W REFLEX MICROSCOPIC
Bacteria, UA: NONE SEEN
Bilirubin Urine: NEGATIVE
Glucose, UA: NEGATIVE mg/dL
Hgb urine dipstick: NEGATIVE
Ketones, ur: NEGATIVE mg/dL
Nitrite: NEGATIVE
Protein, ur: NEGATIVE mg/dL
Specific Gravity, Urine: 1.012 (ref 1.005–1.030)
pH: 7 (ref 5.0–8.0)

## 2022-01-13 LAB — COMPREHENSIVE METABOLIC PANEL
ALT: 9 U/L (ref 0–44)
AST: 16 U/L (ref 15–41)
Albumin: 1.6 g/dL — ABNORMAL LOW (ref 3.5–5.0)
Alkaline Phosphatase: 83 U/L (ref 38–126)
Anion gap: 7 (ref 5–15)
BUN: 8 mg/dL (ref 8–23)
CO2: 31 mmol/L (ref 22–32)
Calcium: 8.8 mg/dL — ABNORMAL LOW (ref 8.9–10.3)
Chloride: 104 mmol/L (ref 98–111)
Creatinine, Ser: 0.82 mg/dL (ref 0.44–1.00)
GFR, Estimated: >60 mL/min (ref >60)
Glucose, Bld: 86 mg/dL (ref 70–99)
Potassium: 3.7 mmol/L (ref 3.5–5.1)
Sodium: 142 mmol/L (ref 135–145)
Total Bilirubin: 0.6 mg/dL (ref 0.3–1.2)
Total Protein: 4.7 g/dL — ABNORMAL LOW (ref 6.5–8.1)

## 2022-01-13 LAB — CBC WITH DIFFERENTIAL/PLATELET
Abs Immature Granulocytes: 0.11 10*3/uL — ABNORMAL HIGH (ref 0.00–0.07)
Basophils Absolute: 0 10*3/uL (ref 0.0–0.1)
Basophils Relative: 0 %
Eosinophils Absolute: 0.1 10*3/uL (ref 0.0–0.5)
Eosinophils Relative: 1 %
HCT: 31 % — ABNORMAL LOW (ref 36.0–46.0)
Hemoglobin: 9.4 g/dL — ABNORMAL LOW (ref 12.0–15.0)
Immature Granulocytes: 1 %
Lymphocytes Relative: 20 %
Lymphs Abs: 2 10*3/uL (ref 0.7–4.0)
MCH: 28 pg (ref 26.0–34.0)
MCHC: 30.3 g/dL (ref 30.0–36.0)
MCV: 92.3 fL (ref 80.0–100.0)
Monocytes Absolute: 0.7 10*3/uL (ref 0.1–1.0)
Monocytes Relative: 7 %
Neutro Abs: 6.9 10*3/uL (ref 1.7–7.7)
Neutrophils Relative %: 71 %
Platelets: 266 10*3/uL (ref 150–400)
RBC: 3.36 MIL/uL — ABNORMAL LOW (ref 3.87–5.11)
RDW: 18.9 % — ABNORMAL HIGH (ref 11.5–15.5)
WBC: 9.8 10*3/uL (ref 4.0–10.5)
nRBC: 0 % (ref 0.0–0.2)

## 2022-01-13 NOTE — Progress Notes (Signed)
Physical Therapy Session Note ? ?Patient Details  ?Name: Holly [name] ?MRN: [id number] ?Date of Birth: 1946-01-04 ? ?Today's Date: 01/13/2022 ?PT Individual Time: 0600-4599 ?PT Individual Time Calculation (min): 30 min  ? ?Short Term Goals: ?Week 1:  PT Short Term Goal 1 (Week 1): Patient will perform basic transfers with CGA. ?PT Short Term Goal 2 (Week 1): Patient will propel w/c >50 ft with supervision. ? ?Skilled Therapeutic Interventions/Progress Updates:  ? ?Pt received supine in bed and agreeable to PT at bed level.  ? ?PT instructed pt in supine therex. Hip abduction, hip/knee flexion/extension, SAQ. Each completed x 10 BLE with AAROM throughout each exercise except hip extension with slight manual resistance.  ?Supine gastroc stretch 2 x 30sec bil. Mild overpressure throughout. Pt noted to be falling asleep when not actively engaged in therex and requesting to rest. Pt left supine in bed with call bell in reach and all needs met.  ? ? ?Therapy Documentation ?Precautions:  ?Precautions ?Precautions: Fall ?Precaution Comments: colostomy; rt quadrant drain;  recent left tibia fx ?Restrictions ?Weight Bearing Restrictions: Yes ?LLE Weight Bearing: Weight bearing as tolerated ?Other Position/Activity Restrictions: WBAT for transfers only ?General: ?PT Amount of Missed Time (min): 30 Minutes ?PT Missed Treatment Reason: Patient fatigue ?Vital Signs: ?Therapy Vitals ?Temp: 98.1 ?F (36.7 ?C) ?Temp Source: Oral ?Pulse Rate: 66 ?Resp: 17 ?BP: 140/60 ?Patient Position (if appropriate): Lying ?Oxygen Therapy ?SpO2: 100 % ?O2 Device: Room Air ?Patient Activity (if Appropriate): In bed ?Pulse Oximetry Type: Intermittent ?Pain: ?Pain Assessment ?Pain Scale: 0-10 ?Pain Score: 0-No pain ?Multiple Pain Sites: No ? ?Therapy/Group: Individual Therapy ? ?Lorie Phenix ?01/13/2022, 4:21 PM  ?

## 2022-01-13 NOTE — Evaluation (Signed)
Physical Therapy Assessment and Plan ? ?Patient Details  ?Name: Holly [name] ?MRN: [id number] ?Date of Birth: 1946/07/18 ? ?PT Diagnosis: Abnormal posture, Abnormality of gait, Difficulty walking, Edema, Muscle weakness, and Pain in abdomen and L lower extremity ?Rehab Potential: Good ?ELOS: 10-14 days  ? ?Today's Date: 01/13/2022 ?PT Individual Time: 5852-7782 ?PT Individual Time Calculation (min): 55 min   ? ?Hospital Problem: Principal Problem: ?  Debility ? ? ?Past Medical History:  ?Past Medical History:  ?Diagnosis Date  ? Anemia   ? blood transfusion November 2022  ? Anxiety   ? Asthma   ? Basal cell carcinoma   ? lung cancer.  Skin cancer- basal - nose removed  ? Bruises easily   ? Cataract   ? Cervicalgia   ? COPD (chronic obstructive pulmonary disease) ([place])   ? emphysema and COPD  ? Depression   ? Dyspnea   ? uses O2 only when needed. Uses 2 L   ? Foot swelling   ? left- has shown to Drs.  ? Frequency of urination   ? Hardware complicating wound infection ([place]) 12/24/2021  ? Headache   ? History of skin cancer   ? 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 Hypercalcemia   ? Hypertension   ? Hypothyroidism   ? Mixed hyperlipidemia   ? MRSA infection 12/24/2021  ? Osteomyelitis of left tibia ([place]) 12/24/2021  ? Pneumonia   ? Pulmonary nodule   ? Sleep apnea   ? pt uses a cpap nightly  ? Thyroid neoplasm   ? Urinary incontinence   ? Wears dentures   ? Wears glasses   ? ?Past Surgical History:  ?Past Surgical History:  ?Procedure Laterality Date  ? BIOPSY  02/17/2021  ? Procedure: BIOPSY;  Surgeon: Otis Brace, MD;  Location: [facility];  Service: Gastroenterology;;  ? BONE BIOPSY Left 12/13/2021  ? Procedure: BONE BIOPSY;  Surgeon: Shona Needles, MD;  Location: [place];  Service: Orthopedics;  Laterality: Left;  ? COLONOSCOPY    ? COLOSTOMY N/A 01/04/2022  ? Procedure: COLOSTOMY;  Surgeon: Felicie Morn, MD;  Location: [place];  Service: General;  Laterality: N/A;  ? DENTAL SURGERY    ? ELBOW ARTHROPLASTY Right 2020  ?  ESOPHAGOGASTRODUODENOSCOPY (EGD) WITH PROPOFOL N/A 02/17/2021  ? Procedure: ESOPHAGOGASTRODUODENOSCOPY (EGD) WITH PROPOFOL;  Surgeon: Otis Brace, MD;  [facility];  Service: Gastroenterology;  Laterality: N/A;  ? EYE SURGERY Right   ? cataract surgery  ? FOOT SURGERY  30 YRS AGO  ? BILATERAL  ? HARDWARE REMOVAL Right 10/02/2020  ? Procedure: HARDWARE REMOVAL RIGHT ELBOW;  Surgeon: Shona Needles, MD;  Location: [place];  Service: Orthopedics;  Laterality: Right;  ? I & D EXTREMITY Right 01/13/2021  ? Procedure: IRRIGATION AND DEBRIDEMENT RIGHT ELBOW;  Surgeon: Shona Needles, MD;  Location: [place];  Service: Orthopedics;  Laterality: Right;  ? LAPAROTOMY N/A 01/04/2022  ? Procedure: EXPLORATORY LAPAROTOMY;  Surgeon: Felicie Morn, MD;  Location: [place];  Service: General;  Laterality: N/A;  ? ORIF ELBOW FRACTURE Right 12/06/2017  ? Procedure: OPEN REDUCTION INTERNAL FIXATION (ORIF) ELBOW/OLECRANON FRACTURE;  Surgeon: Shona Needles, MD;  Location: [place];  Service: Orthopedics;  Laterality: Right;  ? ORIF TIBIA PLATEAU Left 12/13/2021  ? Procedure: OPEN REDUCTION INTERNAL FIXATION (ORIF) TIBIAL PLATEAU;  Surgeon: Shona Needles, MD;  Location: [place];  Service: Orthopedics;  Laterality: Left;  ? PARTIAL COLECTOMY N/A 01/04/2022  ? Procedure: SIGMOID COLECTOMY;  Surgeon: Stechschulte,  Nickola Major, MD;  Location: [place];  Service: General;  Laterality: N/A;  ? SKIN CANCER EXCISION    ? THYROIDECTOMY N/A 07/31/2014  ? Procedure: TOTAL THYROIDECTOMY;  Surgeon: Armandina Gemma, MD;  [facility];  Service: General;  Laterality: N/A;  ? VAGINAL HYSTERECTOMY    ? VIDEO BRONCHOSCOPY WITH ENDOBRONCHIAL NAVIGATION Bilateral 06/25/2019  ? Procedure: VIDEO BRONCHOSCOPY WITH ENDOBRONCHIAL NAVIGATION;  Surgeon: Lajuana Matte, MD;  Location: [place];  Service: Thoracic;  Laterality: Bilateral;  ? ? ?Assessment & Plan ?Clinical Impression: Holly [name] is a 76 year old female who [facility] on  [facility]2021 with complaints of increasing abdominal pain over the course of the previous 6 days.  CT scan of the abdomen and pelvis was significant for sigmoid diverticulitis with likely contained perforation and complex gas and fluid collection.  She was given IV fluids, Tylenol and started on ertapenem.  She was afebrile with a leukocytosis of 14,000 lactic acidosis of 4.3. She was transferred to [facility] for higher level of care.  Infectious disease consultation obtained and antibiotics adjusted to Zosyn. General surgery consultation obtained who agreed with IR consult for percutaneous drainage and conservative therapy. There was no good target for drain placement secondary to multiple, loculated gas pockets.  ?  ?Her medical history is significant for NSCLC and recently underwent repair of tibial plateau fracture by Dr. Doreatha Martin on December 12, 2021.  At that time bone biopsies were obtained and were indeterminate for malignancy, however MRSA was isolated.  She underwent infectious disease consultation at that time with Dr. Drucilla Schmidt for osteomyelitis.  PICC placed 12/29/2021 for administration of daptomycin for 3 weeks per Dr. Drucilla Schmidt. To finish on 4/26. (Will likely need suppressive therapy with doxycycline.)  Daptomycin was continued.   ?  ?She was closely monitored and on 3/21, Dr. Trisha Mangle evaluated the patient and she had worsening pain.  She was counseled regarding intraoperative repair.  Preoperative WOC consult performed for stoma site marking.  On 3/21, the patient was taken to the operating room where she underwent exploratory laparotomy with sigmoid colectomy and colostomy by Dr. Trisha Mangle. She developed right axillary vein DVT secondary to her PICC line.  This was removed and she was placed on Eliquis dosing for DVT to complete 3 months of anticoagulation. She will need to maintain PIV and arrange PICC replacement prior to discharge from CIR. she remained hemodynamically stable postoperatively.  She was  tolerating clear liquid diet and Boost.  On 3/25, she had a small amount of formed stool and her diet was advanced to full liquids.  Her Foley was discontinued and she was voiding spontaneously.  Her midline Penrose drain and pelvic Jackson-Pratt drains were discontinued on 3/28. The patient requires inpatient medicine and rehabilitation evaluations and services for ongoing dysfunction secondary to recent perforated diverticulum, exploratory laparotomy with colostomy, recent tibial plateau fracture repair. Patient transferred to CIR on 01/12/2022 .  ? ?Patient currently requires min with mobility secondary to muscle weakness and muscle joint tightness, decreased cardiorespiratoy endurance and baseline O2 use at 3L/min, and decreased sitting balance, decreased standing balance, decreased postural control, and decreased balance strategies.  Prior to hospitalization, patient was modified independent  with mobility and lived with Daughter in a Mobile home home.  Home access is  Ramped entrance. ? ?Patient will benefit from skilled PT intervention to maximize safe functional mobility, minimize fall risk, and decrease caregiver burden for planned discharge home with intermittent assist.  Anticipate patient will benefit from follow up [facility]  at discharge. ? ?PT - End of Session ?Activity Tolerance: Tolerates 30+ min activity with multiple rests ?Endurance Deficit: Yes ?Endurance Deficit Description: required lying rest break due to decreased activity tolerance ?PT Assessment ?Rehab Potential (ACUTE/IP ONLY): Good ?PT Barriers to Discharge: Decreased caregiver support;Lack of/limited family support;Weight bearing restrictions ?PT Patient demonstrates impairments in the following area(s): Balance;Safety;Edema;Endurance;Motor;Nutrition;Pain ?PT Transfers Functional Problem(s): Bed Mobility;Bed to Chair;Car;Furniture ?PT Locomotion Functional Problem(s): Ambulation;Wheelchair Mobility;Stairs ?PT Plan ?PT Intensity: Minimum of 1-2  x/day ,45 to 90 minutes ?PT Frequency: 5 out of 7 days ?PT Duration Estimated Length of Stay: 10-14 days ?PT Treatment/Interventions: Balance/vestibular training;Community reintegration;Discharge planning;Disease man

## 2022-01-13 NOTE — Progress Notes (Signed)
Inpatient Rehabilitation Care Coordinator ?Assessment and Plan ?Patient Details  ?Name: Holly [name] ?MRN: [id number] ?Date of Birth: Mar 03, 1946 ? ?Today's Date: 01/13/2022 ? ?Hospital Problems: Principal Problem: ?  Debility ? ?Past Medical History:  ?Past Medical History:  ?Diagnosis Date  ? Anemia   ? blood transfusion November 2022  ? Anxiety   ? Asthma   ? Basal cell carcinoma   ? lung cancer.  Skin cancer- basal - nose removed  ? Bruises easily   ? Cataract   ? Cervicalgia   ? COPD (chronic obstructive pulmonary disease) ([place])   ? emphysema and COPD  ? Depression   ? Dyspnea   ? uses O2 only when needed. Uses 2 L   ? Foot swelling   ? left- has shown to Drs.  ? Frequency of urination   ? Hardware complicating wound infection ([place]) 12/24/2021  ? Headache   ? History of skin cancer   ? 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 Hypercalcemia   ? Hypertension   ? Hypothyroidism   ? Mixed hyperlipidemia   ? MRSA infection 12/24/2021  ? Osteomyelitis of left tibia ([place]) 12/24/2021  ? Pneumonia   ? Pulmonary nodule   ? Sleep apnea   ? pt uses a cpap nightly  ? Thyroid neoplasm   ? Urinary incontinence   ? Wears dentures   ? Wears glasses   ? ?Past Surgical History:  ?Past Surgical History:  ?Procedure Laterality Date  ? BIOPSY  02/17/2021  ? Procedure: BIOPSY;  Surgeon: Otis Brace, MD;  Location: [place] [facility];  Service: Gastroenterology;;  ? BONE BIOPSY Left 12/13/2021  ? Procedure: BONE BIOPSY;  Surgeon: Shona Needles, MD;  Location: [place];  Service: Orthopedics;  Laterality: Left;  ? COLONOSCOPY    ? COLOSTOMY N/A 01/04/2022  ? Procedure: COLOSTOMY;  Surgeon: Felicie Morn, MD;  Location: [place];  Service: General;  Laterality: N/A;  ? DENTAL SURGERY    ? ELBOW ARTHROPLASTY Right 2020  ? ESOPHAGOGASTRODUODENOSCOPY (EGD) WITH PROPOFOL N/A 02/17/2021  ? Procedure: ESOPHAGOGASTRODUODENOSCOPY (EGD) WITH PROPOFOL;  Surgeon: Otis Brace, MD;  [facility];  Service: Gastroenterology;  Laterality: N/A;  ? EYE SURGERY  Right   ? cataract surgery  ? FOOT SURGERY  30 YRS AGO  ? BILATERAL  ? HARDWARE REMOVAL Right 10/02/2020  ? Procedure: HARDWARE REMOVAL RIGHT ELBOW;  Surgeon: Shona Needles, MD;  Location: [place];  Service: Orthopedics;  Laterality: Right;  ? I & D EXTREMITY Right 01/13/2021  ? Procedure: IRRIGATION AND DEBRIDEMENT RIGHT ELBOW;  Surgeon: Shona Needles, MD;  Location: [place];  Service: Orthopedics;  Laterality: Right;  ? LAPAROTOMY N/A 01/04/2022  ? Procedure: EXPLORATORY LAPAROTOMY;  Surgeon: Felicie Morn, MD;  Location: [place];  Service: General;  Laterality: N/A;  ? ORIF ELBOW FRACTURE Right 12/06/2017  ? Procedure: OPEN REDUCTION INTERNAL FIXATION (ORIF) ELBOW/OLECRANON FRACTURE;  Surgeon: Shona Needles, MD;  Location: [place];  Service: Orthopedics;  Laterality: Right;  ? ORIF TIBIA PLATEAU Left 12/13/2021  ? Procedure: OPEN REDUCTION INTERNAL FIXATION (ORIF) TIBIAL PLATEAU;  Surgeon: Shona Needles, MD;  Location: [place];  Service: Orthopedics;  Laterality: Left;  ? PARTIAL COLECTOMY N/A 01/04/2022  ? Procedure: SIGMOID COLECTOMY;  Surgeon: Felicie Morn, MD;  Location: [place];  Service: General;  Laterality: N/A;  ? SKIN CANCER EXCISION    ? THYROIDECTOMY N/A 07/31/2014  ? Procedure: TOTAL THYROIDECTOMY;  Surgeon: Armandina Gemma, MD;  [facility];  Service:  General;  Laterality: N/A;  ? VAGINAL HYSTERECTOMY    ? VIDEO BRONCHOSCOPY WITH ENDOBRONCHIAL NAVIGATION Bilateral 06/25/2019  ? Procedure: VIDEO BRONCHOSCOPY WITH ENDOBRONCHIAL NAVIGATION;  Surgeon: Lajuana Matte, MD;  Location: [place];  Service: Thoracic;  Laterality: Bilateral;  ? ?Social History:  reports that she quit smoking about 6 years ago. Her smoking use included cigarettes. She has a 30.00 pack-year smoking history. She has never used smokeless tobacco. She reports current alcohol use. She reports that she does not use drugs. ? ?Family / Support Systems ?Marital Status: Widow/Widower ?Patient Roles: Parent ?Children:  [name]-daughter [phone]  Son from [place] currently staying with Mom at her home ?Other Supports: Rachel-friend [phone] ?Anticipated Caregiver: [name] and her brother ?Ability/Limitations of Caregiver: Daughter works from 8-5:30 but take 2-3 hr lunch her brother-pt's son has been staying with her and can assist also ?Caregiver Availability: 24/7 ?Family Dynamics: Close with both children and they have been assisting her prior to admission due to fractured leg and other health issues. ? ?Social History ?Preferred language: English ?Religion: Baptist ?Cultural Background: No issues ?Education: HS ?Health Literacy - How often do you need to have someone help you when you read instructions, pamphlets, or other written material from your doctor or pharmacy?: Never ?Writes: Yes ?Employment Status: Retired ?Legal History/Current Legal Issues: No issues ?Guardian/Conservator: None-according to MD pt is capable of making her own decisions while here. Daughter does visit daily and would like to be involved-pt agreeable to this  ? ?Abuse/Neglect ?Abuse/Neglect Assessment Can Be Completed: Yes ?Physical Abuse: Denies ?Verbal Abuse: Denies ?Sexual Abuse: Denies ?Exploitation of patient/patient's resources: Denies ?Self-Neglect: Denies ? ?Patient response to: ?Social Isolation - How often do you feel lonely or isolated from those around you?: Never ? ?Emotional Status ?Pt's affect, behavior and adjustment status: Pt is exhausted from am therapies, daughter reports her Mom was doing well at home prior to this hospitalization. She was using a wheelchair and son and she helped her. Pt needs to work on her strength and endurance. Daughter has learned how to take care of the colostomy while on acute ?Recent Psychosocial Issues: other health issues-lung cancer, required home O2 and tib-fib fracture and now this ?Psychiatric History: No history probably would benefit from seeing neuro-psych while here due to has dealt with  much in a short period of time with her health issues ?Substance Abuse History: No issues ? ?Patient / Family Perceptions, Expectations & Goals ?Pt/Family understanding of illness & functional limitations: Pt and daughter can explain her health issues and reason for this hospitalization. Daughter is usually here daily and talks with the MD. Both for now feel they have a good understanding of her treatment plan moving forward. ?Premorbid pt/family roles/activities: Mom, retiree, grandparent, neighbor, church member ?Anticipated changes in roles/activities/participation: resume ?Pt/family expectations/goals: Pt states: " I hope to do well here and get more mobile and stronger."  Daughter states: " I hope she makes good progress and can be moving and not heavy care." ? ?Community Resources ?Community Agencies: Other (Comment) [facility]) ?Premorbid Home Care/DME Agencies: Other (Comment) (has wc, home O2, bsc, rollator, shower chair [place] acttve with) ?Transportation available at discharge: Children ?Is the patient able to respond to transportation needs?: Yes ?In the past 12 months, has lack of transportation kept you from medical appointments or from getting medications?: No ?In the past 12 months, has lack of transportation kept you from meetings, work, or from getting things needed for daily living?: No ?Resource referrals recommended: Neuropsychology ? ?  Discharge Planning ?Living Arrangements: Children ?Support Systems: Children, Friends/neighbors, Social worker community ?Type of Residence: Private residence ?Insurance Resources: [employer], Multimedia [employer] (specify) Nurse, mental health) ?Financial Resources: Social Security ?Financial Screen Referred: No ?Living Expenses: Own ?Money Management: Patient, Family ?Does the patient have any problems obtaining your medications?: No ?Home Management: Children since pt has been ill-and fractured leg ?Patient/Family Preliminary Plans: Return home wirh son who is currently  staying there with her, daughter works full time and does not live with her. Daughter's goal is to get pt home from here and not go to a SNF. Will await team's evaluation and work on discharge needs. ?Care Coordina

## 2022-01-13 NOTE — Progress Notes (Signed)
PHARMACY CONSULT NOTE FOR: ? ?OUTPATIENT  PARENTERAL ANTIBIOTIC THERAPY (OPAT) ? ?Indication: Left knee osteomyelitis ?Regimen: Daptomycin 500 mg IV q24hrs ?End date: 02/09/22 for a total of 6 weeks ? ?IV antibiotic discharge orders are pended. ?To discharging provider:  please sign these orders via discharge navigator,  ?Select New Orders & click on the button choice - Manage This Unsigned Work.  ?  ? ?Thank you for allowing pharmacy to be a part of this patient's care. ? ?Tad Moore, PharmD ?01/13/2022, 8:47 AM ? ?

## 2022-01-13 NOTE — Progress Notes (Signed)
Inpatient Rehabilitation Admission Medication Review by a Pharmacist ? ?A complete drug regimen review was completed for this patient to identify any potential clinically significant medication issues. ? ?High Risk Drug Classes Is patient taking? Indication by Medication  ?Antipsychotic Yes PRN compazine for N/V  ?Anticoagulant Yes Eliquis for VTE  ?Antibiotic Yes Daptomycin for osteo until 02/09/22  ?Opioid Yes Prn oxycodone for pain  ?Antiplatelet No   ?Hypoglycemics/insulin No   ?Vasoactive Medication Yes Toprol for BP  ?Chemotherapy No   ?Other Yes Atorvastatin for HLD ?Buspar, Wellbutrin, Remeron for mood/anxiety ?Aricept for dementia ?Breo/incruse, prednisone for COPD ?Synthroid for low thyroid  ? ? ? ?Type of Medication Issue Identified Description of Issue Recommendation(s)  ?Drug Interaction(s) (clinically significant) ?    ?Duplicate Therapy ?    ?Allergy ?    ?No Medication Administration End Date ?  Added stop date for daptomycin  ?Incorrect Dose ?    ?Additional Drug Therapy Needed ?  To resume losartan when and if appropriate after rehab stay  ?Significant med changes from prior encounter (inform family/care partners about these prior to discharge).    ?Other ?    ? ? ?Clinically significant medication issues were identified that warrant physician communication and completion of prescribed/recommended actions by midnight of the next day:  No ? ?Pharmacist comments: Above ? ?Time spent performing this drug regimen review (minutes):  20 minutes ? ? ?Tad Moore ?01/13/2022 8:35 AM ?

## 2022-01-13 NOTE — Discharge Summary (Signed)
Physician Discharge Summary  ?Patient ID: ?Lonzo Candy Stjulien ?MRN: [id number] ?DOB/AGE: 03-28-1946 76 y.o. ? ?Admit date: 01/12/2022 ?Discharge date: 01/27/2022 ? ?Discharge Diagnoses:  ?Principal Problem: ?  Debility ?Active problems: ?Ruptured diverticulum ?S/p colostomy ?NSCLC ?Left tibial plateau fracture ?MRSA osteomyelitis ?Right axillary vein DVT ?COPD ?Hypertension ?Hypothyroidism ?Insomnia ?CKD stage IIIb ?Dysuria ?Right foot weakness ?Candida positive urine ?Cognitive decline ?Melena ? ?Discharged Condition: stable ? ?Significant Diagnostic Studies: ?CT ABDOMEN PELVIS W CONTRAST ? ?Result Date: 01/02/2022 ?CLINICAL DATA:  Perforated diverticulitis follow-up. EXAM: CT ABDOMEN AND PELVIS WITH CONTRAST TECHNIQUE: Multidetector CT imaging of the abdomen and pelvis was performed using the standard protocol following bolus administration of intravenous contrast. RADIATION DOSE REDUCTION: This exam was performed according to the departmental dose-optimization program which includes automated exposure control, adjustment of the mA and/or kV according to patient size and/or use of iterative reconstruction technique. CONTRAST:  144mL OMNIPAQUE IOHEXOL 350 MG/ML SOLN COMPARISON:  CT abdomen pelvis dated December 31, 2021. FINDINGS: Lower chest: No acute abnormality. Minimal dependent atelectasis in the posterior right lower lobe. Hepatobiliary: Subcentimeter ill-defined hypodensities in the right liver are unchanged but remain too small to characterize. No new focal liver abnormality. Distended gallbladder containing some excreted contrast. No gallstones, wall thickening, or biliary dilatation. Pancreas: Unremarkable. No pancreatic ductal dilatation or surrounding inflammatory changes. Spleen: Normal in size without focal abnormality. Adrenals/Urinary Tract: Unchanged 1.2 cm left adrenal nodule, characterized as a benign adenoma on prior PET-CT. No further follow-up required. The right adrenal gland is unremarkable. No renal  mass, calculi, or hydronephrosis. Excreted contrast within the bladder. Multiple bladder diverticula. Stomach/Bowel: The stomach is within normal limits. Similar findings in the pelvis consistent with acute perforated sigmoid diverticulitis. Previously seen abscess between the sigmoid colon and bladder currently measures 5.6 x 5.5 cm (series 3, image 60), previously 5.6 x 5.4 cm. This communicates with a more superior loculated fluid collection in the central abdomen currently measuring 2.7 x 10.3 cm (series 3, image 52), previously 2.6 x 8.4 cm. Additional fluid collection more inferiorly adjacent to the rectosigmoid colon currently measures 3.9 x 6.2 cm (series 3, image 70), previously 3.5 x 6.1 cm. No new fluid collection. No obstruction. Normal appendix. Vascular/Lymphatic: Aortic atherosclerosis. No enlarged abdominal or pelvic lymph nodes. Reproductive: Status post hysterectomy. No adnexal masses. Other: No pneumoperitoneum. Musculoskeletal: No acute or significant osseous findings. IMPRESSION: 1. Similar findings in the pelvis consistent with acute perforated sigmoid diverticulitis. Three fluid collections/abscesses in the lower abdomen and pelvis are not significantly changed compared to prior study. 2. Aortic Atherosclerosis (ICD10-I70.0). Electronically Signed   By: Titus Dubin M.D.   On: 01/02/2022 17:03  ? ?DG Knee Left Port ? ?Result Date: 01/05/2022 ?CLINICAL DATA:  Tibial plateau fracture. EXAM: PORTABLE LEFT KNEE - 1-2 VIEW COMPARISON:  Left knee x-ray 12/13/2021. FINDINGS: The bones are osteopenic. Lateral tibial sideplate and screws are present fixating a tibial plateau fracture. Alignment is anatomic. No acute fracture. There is mild medial and lateral compartment joint space narrowing and osteophyte formation compatible with degenerative change, similar to the prior study. Anterior soft tissue swelling and air have resolved in the interval. IMPRESSION: 1. Left tibial plateau fracture status  post ORIF. Alignment is anatomic. No evidence for complication. Electronically Signed   By: Ronney Asters M.D.   On: 01/05/2022 16:11  ? ?DG Abd Portable 1V ? ?Result Date: 01/01/2022 ?CLINICAL DATA:  Diverticulitis EXAM: PORTABLE ABDOMEN - 1 VIEW COMPARISON:  Abdominal radiographs done on 08/11/2014, CT done on 12/31/2021 FINDINGS:  There is moderate gaseous distention of stomach. There is moderate gaseous distention of transverse colon. There is no significant small bowel dilation. There is residual contrast in the urinary bladder. There is 4.6 cm bladder diverticulum in the right side of the urinary bladder with contrast in the lumen. IMPRESSION: There is moderate gaseous distention of stomach and transverse colon, possibly suggesting ileus. There is possible urinary bladder diverticulum. Electronically Signed   By: Elmer Picker M.D.   On: 01/01/2022 15:13  ? ?VAS [place] UPPER EXTREMITY VENOUS DUPLEX ? ?Result Date: 01/11/2022 ?UPPER VENOUS STUDY  Patient Name:  [facility]  Date of Exam:   01/11/2022 Medical Rec #: [id number]      Accession #:    [id number] Date of Birth: 06/17/1946      Patient Gender: F Patient Age:   76 years Exam Location:  [facility] Procedure:      VAS [place] UPPER EXTREMITY VENOUS DUPLEX Referring Phys: Archie Patten HALL --------------------------------------------------------------------------------  Indications: Edema, Erythema, and Right upper extremity PICC line Comparison Study: No prior study Performing Technologist: Maudry Mayhew MHA, RDMS, RVT, RDCS  Examination Guidelines: A complete evaluation includes B-mode imaging, spectral Doppler, color Doppler, and power Doppler as needed of all accessible portions of each vessel. Bilateral testing is considered an integral part of a complete examination. Limited examinations for reoccurring indications may be performed as noted.  Right Findings: +----------+------------+---------+-----------+----------+--------------------+ RIGHT      CompressiblePhasicitySpontaneousProperties      Summary        +----------+------------+---------+-----------+----------+--------------------+ IJV           Full       Yes       Yes                                   +----------+------------+---------+-----------+----------+--------------------+ Subclavian    Full       Yes       Yes                                   +----------+------------+---------+-----------+----------+--------------------+ Axillary      None                 No                Acute surrounding                                                               PICC         +----------+------------+---------+-----------+----------+--------------------+ Brachial      Full       Yes       Yes                                   +----------+------------+---------+-----------+----------+--------------------+ Radial        Full                                                       +----------+------------+---------+-----------+----------+--------------------+  Ulnar         Full                                                       +----------+------------+---------+-----------+----------+--------------------+ Cephalic      Full                                                       +----------+------------+---------+-----------+----------+--------------------+ Basilic       Full                                                       +----------+------------+---------+-----------+----------+--------------------+  Left Findings: +----------+------------+---------+-----------+----------+-------+ LEFT      CompressiblePhasicitySpontaneousPropertiesSummary +----------+------------+---------+-----------+----------+-------+ Subclavian               Yes       Yes                      +----------+------------+---------+-----------+----------+-------+  Summary:  Right: No evidence of superficial vein thrombosis in the upper extremity.  Findings consistent with acute deep vein thrombosis involving the right axillary vein surrounding the PICC line.  Left: No evidence of thrombosis in the subclavian.  *See table(s) above for measurements and observations.  Diagnosing physician: Deitra Mayo MD Electronically signed by Loletha Grayer

## 2022-01-13 NOTE — Progress Notes (Signed)
?                                                       PROGRESS NOTE ? ? ?Subjective/Complaints: ? ?Pt reports is on O2 at home- usually 3L ?Has pain in abdomen- aching pain- but better with pain meds.  ?Leg pain from previous fx OK.  ?Having a lot of urgency- is chronic, but worse and having dysuria right now/burning mainly.  ? ?Asking to do purewick at night. Can do for now and monitor. But not during day.  ? ?ROS: ? ?Pt denies SOB, abd pain, CP, N/V/C/D, and vision changes ? ? ?Objective: ?  ?VAS [place] UPPER EXTREMITY VENOUS DUPLEX ? ?Result Date: 01/11/2022 ?UPPER VENOUS STUDY  Patient Name:  [facility]  Date of Exam:   01/11/2022 Medical Rec #: [id number]      Accession #:    [id number] Date of Birth: Mar 04, 1946      Patient Gender: F Patient Age:   75 years Exam Location:  [facility] Procedure:      VAS [place] UPPER EXTREMITY VENOUS DUPLEX Referring Phys: Archie Patten HALL --------------------------------------------------------------------------------  Indications: Edema, Erythema, and Right upper extremity PICC line Comparison Study: No prior study Performing Technologist: Maudry Mayhew MHA, RDMS, RVT, RDCS  Examination Guidelines: A complete evaluation includes B-mode imaging, spectral Doppler, color Doppler, and power Doppler as needed of all accessible portions of each vessel. Bilateral testing is considered an integral part of a complete examination. Limited examinations for reoccurring indications may be performed as noted.  Right Findings: +----------+------------+---------+-----------+----------+--------------------+ RIGHT     CompressiblePhasicitySpontaneousProperties      Summary        +----------+------------+---------+-----------+----------+--------------------+ IJV           Full       Yes       Yes                                   +----------+------------+---------+-----------+----------+--------------------+ Subclavian    Full       Yes       Yes                                    +----------+------------+---------+-----------+----------+--------------------+ Axillary      None                 No                Acute surrounding                                                               PICC         +----------+------------+---------+-----------+----------+--------------------+ Brachial      Full       Yes       Yes                                   +----------+------------+---------+-----------+----------+--------------------+  Radial        Full                                                       +----------+------------+---------+-----------+----------+--------------------+ Ulnar         Full                                                       +----------+------------+---------+-----------+----------+--------------------+ Cephalic      Full                                                       +----------+------------+---------+-----------+----------+--------------------+ Basilic       Full                                                       +----------+------------+---------+-----------+----------+--------------------+  Left Findings: +----------+------------+---------+-----------+----------+-------+ LEFT      CompressiblePhasicitySpontaneousPropertiesSummary +----------+------------+---------+-----------+----------+-------+ Subclavian               Yes       Yes                      +----------+------------+---------+-----------+----------+-------+  Summary:  Right: No evidence of superficial vein thrombosis in the upper extremity. Findings consistent with acute deep vein thrombosis involving the right axillary vein surrounding the PICC line.  Left: No evidence of thrombosis in the subclavian.  *See table(s) above for measurements and observations.  Diagnosing physician: Deitra Mayo MD Electronically signed by Deitra Mayo MD on 01/11/2022 at 2:57:36 PM.    Final    ?Recent Labs  ?   01/12/22 ?0345 01/13/22 ?0507  ?WBC 13.1* 9.8  ?HGB 10.3* 9.4*  ?HCT 34.1* 31.0*  ?PLT 219 266  ? ?Recent Labs  ?  01/12/22 ?0345 01/13/22 ?0507  ?NA 139 142  ?K 3.8 3.7  ?CL 101 104  ?CO2 31 31  ?GLUCOSE 110* 86  ?BUN 11 8  ?CREATININE 0.66 0.82  ?CALCIUM 8.6* 8.8*  ? ? ?Intake/Output Summary (Last 24 hours) at 01/13/2022 1034 ?Last data filed at 01/13/2022 0902 ?Gross per 24 hour  ?Intake 60 ml  ?Output 1850 ml  ?Net -1790 ml  ?  ? ?Pressure Injury 01/06/22 Sacrum Stage 2 -  Partial thickness loss of dermis presenting as a shallow open injury with a red, pink wound bed without slough. (Active)  ?01/06/22 1603  ?Location: Sacrum  ?Location Orientation:   ?Staging: Stage 2 -  Partial thickness loss of dermis presenting as a shallow open injury with a red, pink wound bed without slough.  ?Wound Description (Comments):   ?Present on Admission:   ? ? ?Physical Exam: ?Vital Signs ?Blood pressure (!) 178/68, pulse 73, temperature 97.9 ?F (36.6 ?C), temperature source Oral, resp. rate 19, height 5' (1.524 m), weight 64.7 kg, SpO2 100 %. ? ? ? ?General: awake,  alert, appropriate, supine in bed; wearing O2 by [place] 3L; NAD ?HENT: conjugate gaze; bandaid on nose ?CV: regular rate; no JVD ?Pulmonary: CTA B/L; no W/R/R- good air movement ?GI: soft, mildly diffuse TTP- no rebound; incision healing- colostomy needs to be burped- full of air- and 1/2 full of brown stool ?Psychiatric: appropriate- slightly flat ?Neurological: Ox3 ?Skin: ?  General: Skin is warm and dry.  ?   Comments: Midline incision and left lower extremity incisions helaing without signs of infection  ?Neurological:  ?   Mental Status: She is alert and oriented to person, place, and time.  ?   Motor: Weakness present.  ?   Comments: Weak dorsiflexion of right foot  ? ?Assessment/Plan: ?1. Functional deficits which require 3+ hours per day of interdisciplinary therapy in a comprehensive inpatient rehab setting. ?Physiatrist is providing close team supervision and 24  hour management of active medical problems listed below. ?Physiatrist and rehab team continue to assess barriers to discharge/monitor patient progress toward functional and medical goals ? ?Care Tool: ? ?Bathing ?   ?   ?   ?  ?  ?Bathing assist   ?  ?  ?Upper Body Dressing/Undressing ?Upper body dressing   ?What is the patient wearing?: [place] only ?   ?Upper body assist Assist Level: Maximal Assistance - Patient 25 - 49% ?   ?Lower Body Dressing/Undressing ?Lower body dressing ? ? ?   ?What is the patient wearing?: Incontinence brief ? ?  ? ?Lower body assist Assist for lower body dressing: Maximal Assistance - Patient 25 - 49% ?   ? ?Toileting ?Toileting    ?Toileting assist Assist for toileting: Total Assistance - Patient < 25% ?  ?  ?Transfers ?Chair/bed transfer ? ?Transfers assist ?   ? ?  ?  ?  ?Locomotion ?Ambulation ? ? ?Ambulation assist ? ?   ? ?  ?  ?   ? ?Walk 10 feet activity ? ? ?Assist ?   ? ?  ?   ? ?Walk 50 feet activity ? ? ?Assist   ? ?  ?   ? ? ?Walk 150 feet activity ? ? ?Assist   ? ?  ?  ?  ? ?Walk 10 feet on uneven surface  ?activity ? ? ?Assist   ? ? ?  ?   ? ?Wheelchair ? ? ? ? ?Assist   ?  ?  ? ?  ?   ? ? ?Wheelchair 50 feet with 2 turns activity ? ? ? ?Assist ? ?  ?  ? ? ?   ? ?Wheelchair 150 feet activity  ? ? ? ?Assist ?   ? ? ?   ? ?Blood pressure (!) 178/68, pulse 73, temperature 97.9 ?F (36.6 ?C), temperature source Oral, resp. rate 19, height 5' (1.524 m), weight 64.7 kg, SpO2 100 %. ? ?Medical Problem List and Plan: ?1. Functional deficits secondary to perforated diverticulum, exploratory laparotomy with colostomy, recent tibial plateau fracture repair. ?            -patient may shower but colostomy must be covered ?            -ELOS/Goals: MinA/S 12-16 days     ?            Con't CIR- first day of evaluations- PT and OT ?2.  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            -  antiplatelet  therapy: none ?3. Pain: continue Tylenol, oxycodone PRN ?            --Robaxin 500 mg q 6 hours ? 3/30- pain controlled on current regimen- con't and titrate as required ?4. Mood: LCSW to evaluate and provide emotional support ?            -continue Wellbutrin XL 300 mg q AM ?            -contin

## 2022-01-13 NOTE — Progress Notes (Signed)
Pt not ready for CPAP at this time. She states she will call if she needs the CPAP. ?

## 2022-01-13 NOTE — Evaluation (Addendum)
Occupational Therapy Assessment and Plan ? ?Patient Details  ?Name: Holly [name] ?MRN: [id number] ?Date of Birth: Oct 20, 1945 ? ?OT Diagnosis: muscle weakness (generalized) ?Rehab Potential: Rehab Potential (ACUTE ONLY): Fair (Fair+ to Good) ?ELOS: 10-14 days  ? ?Today's Date: 01/13/2022 ?OT Individual Time: 1017-5102 ?OT Individual Time Calculation (min): 66 min    ? ?Hospital Problem: Principal Problem: ?  Debility ? ? ?Past Medical History:  ?Past Medical History:  ?Diagnosis Date  ? Anemia   ? blood transfusion November 2022  ? Anxiety   ? Asthma   ? Basal cell carcinoma   ? lung cancer.  Skin cancer- basal - nose removed  ? Bruises easily   ? Cataract   ? Cervicalgia   ? COPD (chronic obstructive pulmonary disease) ([place])   ? emphysema and COPD  ? Depression   ? Dyspnea   ? uses O2 only when needed. Uses 2 L   ? Foot swelling   ? left- has shown to Drs.  ? Frequency of urination   ? Hardware complicating wound infection ([place]) 12/24/2021  ? Headache   ? History of skin cancer   ? 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 Hypercalcemia   ? Hypertension   ? Hypothyroidism   ? Mixed hyperlipidemia   ? MRSA infection 12/24/2021  ? 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 Pneumonia   ? Pulmonary nodule   ? Sleep apnea   ? pt uses a cpap nightly  ? Thyroid neoplasm   ? Urinary incontinence   ? Wears dentures   ? Wears glasses   ? ?Past Surgical History:  ?Past Surgical History:  ?Procedure Laterality Date  ? BIOPSY  02/17/2021  ? Procedure: BIOPSY;  Surgeon: Otis Brace, MD;  Location: [facility];  Service: Gastroenterology;;  ? BONE BIOPSY Left 12/13/2021  ? Procedure: BONE BIOPSY;  Surgeon: Shona Needles, MD;  Location: [place] [facility];  Service: Orthopedics;  Laterality: Left;  ? COLONOSCOPY    ? COLOSTOMY N/A 01/04/2022  ? Procedure: COLOSTOMY;  Surgeon: Felicie Morn, MD;  Location: [facility];  Service: General;  Laterality: N/A;  ? DENTAL SURGERY    ? ELBOW ARTHROPLASTY Right 2020  ? ESOPHAGOGASTRODUODENOSCOPY (EGD) WITH PROPOFOL  N/A 02/17/2021  ? Procedure: ESOPHAGOGASTRODUODENOSCOPY (EGD) WITH PROPOFOL;  Surgeon: Otis Brace, MD;  Location: [facility];  Service: Gastroenterology;  Laterality: N/A;  ? EYE SURGERY Right   ? cataract surgery  ? FOOT SURGERY  30 YRS AGO  ? BILATERAL  ? HARDWARE REMOVAL Right 10/02/2020  ? Procedure: HARDWARE REMOVAL RIGHT ELBOW;  Surgeon: Shona Needles, MD;  Location: [place] [facility];  Service: Orthopedics;  Laterality: Right;  ? I & D EXTREMITY Right 01/13/2021  ? Procedure: IRRIGATION AND DEBRIDEMENT RIGHT ELBOW;  Surgeon: Shona Needles, MD;  Location: [place];  Service: Orthopedics;  Laterality: Right;  ? LAPAROTOMY N/A 01/04/2022  ? Procedure: EXPLORATORY LAPAROTOMY;  Surgeon: Felicie Morn, MD;  Location: [place];  Service: General;  Laterality: N/A;  ? ORIF ELBOW FRACTURE Right 12/06/2017  ? Procedure: OPEN REDUCTION INTERNAL FIXATION (ORIF) ELBOW/OLECRANON FRACTURE;  Surgeon: Shona Needles, MD;  Location: [place];  Service: Orthopedics;  Laterality: Right;  ? ORIF TIBIA PLATEAU Left 12/13/2021  ? Procedure: OPEN REDUCTION INTERNAL FIXATION (ORIF) TIBIAL PLATEAU;  Surgeon: Shona Needles, MD;  Location: [facility];  Service: Orthopedics;  Laterality: Left;  ? PARTIAL COLECTOMY N/A 01/04/2022  ? Procedure: SIGMOID COLECTOMY;  Surgeon: Felicie Morn, MD;  Location: [place];  Service: General;  Laterality: N/A;  ? SKIN CANCER EXCISION    ? THYROIDECTOMY N/A 07/31/2014  ? Procedure: TOTAL THYROIDECTOMY;  Surgeon: Armandina Gemma, MD;  Location: [facility];  Service: General;  Laterality: N/A;  ? VAGINAL HYSTERECTOMY    ? VIDEO BRONCHOSCOPY WITH ENDOBRONCHIAL NAVIGATION Bilateral 06/25/2019  ? Procedure: VIDEO BRONCHOSCOPY WITH ENDOBRONCHIAL NAVIGATION;  Surgeon: Lajuana Matte, MD;  Location: [place];  Service: Thoracic;  Laterality: Bilateral;  ? ? ?Assessment & Plan ?Clinical Impression: Patient is a 76 y.o. year old female with recent admission to [facility] on 12/[facility]/2021 with complaints of increasing  abdominal pain over the course of the previous 6 days.  CT scan of the abdomen and pelvis was significant for sigmoid diverticulitis with likely contained perforation and complex gas and fluid collection. She was transferred to [facility] for higher level of care.  Infectious disease consultation obtained and antibiotics adjusted to Zos[place]. General surgery consultation obtained who agreed with IR consult for percutaneous drainage and conservative therapy. There was no good target for drain placement secondary to multiple, loculated gas pocketsmedical history is significant for NSCLC and recently underwent repair of tibial plateau fracture by Dr. Doreatha Martin on December 12, 2021.  At that time bone biopsies were obtained and were indeterminate for malignancy, however MRSA was isolated.  She underwent infectious disease consultation at that time with Dr. Drucilla Schmidt for osteomyelitis.  PICC placed 12/29/2021 for administration of daptomycin for 3 weeks per Dr. Drucilla Schmidt. To finish on 4/26. On 3/21, the patient was taken to the operating room where she underwent exploratory laparotomy with sigmoid colectomy and colostomy by Dr. Trisha Mangle. She developed right axillary vein DVT secondary to her PICC line.   Patient transferred to CIR on 01/12/2022 .   ? ?Patient currently requires min -mod with basic self-care skills secondary to muscle weakness and decreased cardiorespiratoy endurance and decreased oxygen support.  Prior to hospitalization, patient could complete  ?BADL related task in bathing and dress with modified independent . ? ?Patient will benefit from skilled intervention to increase independence with basic self-care skills prior to discharge home with care partner.  Anticipate patient will require intermittent supervision and follow up home health. ? ?OT - End of Session ?Activity Tolerance: Tolerates 30+ min activity with multiple rests ?Endurance Deficit: Yes ?Endurance Deficit Description: required lying rest break due to  decreased activity tolerance ?OT Assessment ?Rehab Potential (ACUTE ONLY): Fair (Fair+ to Good) ?OT Patient demonstrates impairments in the following area(s): Balance;Endurance;Motor;Safety;Pain ?OT Basic ADL's Functional Problem(s): Bathing;Dressing;Toileting ?OT Transfers Functional Problem(s): Tub/Shower ?OT Plan ?OT Intensity: Minimum of 1-2 x/day, 45 to 90 minutes ?OT Frequency: 5 out of 7 days ?OT Duration/Estimated Length of Stay: 10-14 days ?OT Treatment/Interventions: Self Care/advanced ADL retraining;Therapeutic Exercise;Pain management;UE/LE Strength taining/ROM;Therapeutic Activities;Patient/family education ?OT Self Feeding Anticipated Outcome(s): s/u ?OT Basic Self-Care Anticipated Outcome(s): ModI ?OT Toileting Anticipated Outcome(s): ModI ?OT Bathroom Transfers Anticipated Outcome(s): MosI ?OT Recommendation  ?Patient destination: Home ?Follow Up Recommendations: Home health OT ?Equipment Recommended: Rolling walker with 5" wheels ?Equipment Details: patient has AE and handicap assessible  mobile unit ? ? ?OT Evaluation ?Precautions/Restrictions  ?Precautions ?Precautions: Fall ?Precaution Comments: colostomy; rt quadrant drain;  recent left tibia fx ?Restrictions ?Weight Bearing Restrictions: Yes ?LLE Weight Bearing: Weight bearing as tolerated ?Other Position/Activity Restrictions: WBAT for transfers only ?General ?Chart Reviewed: Yes ?Family/Caregiver Present: No ?Vital Signs ?Therapy Vitals ?Temp: 98.1 ?F (36.7 ?C) ?Temp Source: Oral ?Pulse Rate: 66 ?Resp: 17 ?BP: 140/60 ?Patient Position (if appropriate): Lying ?Oxygen Therapy ?SpO2:  100 % ?O2 Device: Room Air ?Patient Activity (if Appropriate): In bed ?Pulse Oximetry Type: Intermittent ?Pain ?Pain Assessment ?Pain Scale: 0-10 ?Pain Score: 0-No pain ?Multiple Pain Sites: No ?Home Living/Prior Functioning ?Home Living ?Family/patient expects to be discharged to:: Private residence ?Living Arrangements: Children ?Available Help at Discharge:  Family, Available PRN/intermittently ?Type of Home: Mobile home ?Home Access: Ramped entrance ?Home Layout: One level ?Bathroom Shower/Tub: Walk-in shower ?Bathroom Toilet: Handicapped height ?Bathroom Accessibility:

## 2022-01-13 NOTE — Progress Notes (Signed)
Inpatient Rehabilitation Center ?Individual Statement of Services ? ?Patient Name:  Holly [name]  ?Date:  01/13/2022 ? ?Welcome to the [facility].  Our goal is to provide you with an individualized program based on your diagnosis and situation, designed to meet your specific needs.  With this comprehensive rehabilitation program, you will be expected to participate in at least 3 hours of rehabilitation therapies Monday-Friday, with modified therapy programming on the weekends. ? ?Your rehabilitation program will include the following services:  Physical Therapy (PT), Occupational Therapy (OT), 24 hour per day rehabilitation nursing, Therapeutic Recreaction (TR), Neuropsychology, Care Coordinator, Rehabilitation Medicine, Nutrition Services, and Pharmacy Services ? ?Weekly team conferences will be held on Tuesday to discuss your progress.  Your Inpatient Rehabilitation Care Coordinator will talk with you frequently to get your input and to update you on team discussions.  Team conferences with you and your family in attendance may also be held. ? ?Expected length of stay: 10-14 days  Overall anticipated outcome: independent with device ? ?Depending on your progress and recovery, your program may change. Your Inpatient Rehabilitation Care Coordinator will coordinate services and will keep you informed of any changes. Your Inpatient Rehabilitation Care Coordinator's name and contact numbers are listed  below. ? ?The following services may also be recommended but are not provided by the [facility]:  ? ?Home Health Rehabiltiation Services ?Outpatient Rehabilitation Services ? ?  ?Arrangements will be made to provide these services after discharge if needed.  Arrangements include referral to agencies that provide these services. ? ?Your insurance has been verified to be:  [employer] ?Your primary doctor is:  Harlan Stains ? ?Pertinent information will be shared with your  doctor and your insurance company. ? ?Inpatient Rehabilitation Care Coordinator:  Ovidio Kin, [place] or (C) [phone] ? ?Information discussed with and copy given to patient by: [name], 01/13/2022, 11:43 AM    ?

## 2022-01-13 NOTE — Progress Notes (Signed)
Inpatient Rehabilitation  Patient information reviewed and entered into eRehab system by Mahin Guardia Calandria Mullings, OTR/L.   Information including medical coding, functional ability and quality indicators will be reviewed and updated through discharge.    

## 2022-01-13 NOTE — Progress Notes (Signed)
Patient ID: Holly [name], female   DOB: [birth year], 76 y.o.   MRN: [id number] ?Met with the patient to introduce self, review role, team conference and plan of care. Patient is very accepting to situation and receptive to education about care. Extra ostomy supplies ordered for patient. Reviewed medications, IV Abx through 02/09/22/educ for home ostomy care and skin care along with dietary modification recommendations. Patient noted daughter works but will help with her care p discharge. Continue to follow along to discharge to address educational needs to facilitate preparation for discharge. Holly [name] ? ?

## 2022-01-14 DIAGNOSIS — R5381 Other malaise: Secondary | ICD-10-CM | POA: Diagnosis not present

## 2022-01-14 LAB — URINE CULTURE: Culture: <10000 — AB

## 2022-01-14 MED ORDER — FLUCONAZOLE 100 MG PO TABS
100.0000 mg | ORAL_TABLET | Freq: Every day | ORAL | Status: AC
  Administered 2022-01-15 – 2022-01-20 (×6): 100 mg via ORAL
  Filled 2022-01-14 (×6): qty 1

## 2022-01-14 MED ORDER — GERHARDT'S BUTT CREAM
TOPICAL_CREAM | Freq: Four times a day (QID) | CUTANEOUS | Status: DC
  Administered 2022-01-15 – 2022-01-25 (×4): 1 via TOPICAL
  Filled 2022-01-14: qty 1

## 2022-01-14 MED ORDER — FLUCONAZOLE 100 MG PO TABS
200.0000 mg | ORAL_TABLET | Freq: Once | ORAL | Status: AC
  Administered 2022-01-14: 200 mg via ORAL
  Filled 2022-01-14: qty 2

## 2022-01-14 MED ORDER — POVIDONE-IODINE 10 % EX OINT
TOPICAL_OINTMENT | Freq: Three times a day (TID) | CUTANEOUS | Status: DC
  Filled 2022-01-14: qty 28.35

## 2022-01-14 NOTE — Progress Notes (Signed)
Physical Therapy Session Note ? ?Patient Details  ?Name: Holly [name] ?MRN: [id number] ?Date of Birth: 1946-06-07 ? ?Today's Date: 01/14/2022 ?PT Individual Time: 1062-6948 ?PT Individual Time Calculation (min): 56 min  ? ?Short Term Goals: ?Week 1:  PT Short Term Goal 1 (Week 1): Patient will perform basic transfers with CGA. ?PT Short Term Goal 2 (Week 1): Patient will propel w/c >50 ft with supervision. ? ?Skilled Therapeutic Interventions/Progress Updates:  ?  Patient received sitting up in wc, agreeable to PT. She reports "some pain," but did not rate- premedicated. PT providing rest breaks, distractions and repositioning to assist with pain management. PT transporting patient in wc to therapy gym for time management and energy conservation. She completed blocked practice transfers, first with RW and then with HHA. Patient requiring grossly CGA using AD and without, but reported feeling "more secure" with HHA. Patient completing the following therex seated: LAQ, HSC, hip abd/add, marches. She was able to propel herself in the wc 163ft with supervision. Requesting to return to bed, bed alarm on, call light within reach.  ? ?Therapy Documentation ?Precautions:  ?Precautions ?Precautions: Fall ?Precaution Comments: colostomy; rt quadrant drain;  recent left tibia fx ?Restrictions ?Weight Bearing Restrictions: Yes ?LLE Weight Bearing: Weight bearing as tolerated ?Other Position/Activity Restrictions: WBAT for transfers only ? ? ?Therapy/Group: Individual Therapy ? ?Debbora Dus ?Debbora Dus, PT, DPT, CBIS ? ?01/14/2022, 7:48 AM  ?

## 2022-01-14 NOTE — Progress Notes (Signed)
Physical Therapy Session Note ? ?Patient Details  ?Name: Holly [name] ?MRN: [id number] ?Date of Birth: 03-Dec-1945 ? ?Today's Date: 01/14/2022 ?PT Individual Time: 1000-1045 ?PT Individual Time Calculation (min): 45 min  ? ?Short Term Goals: ?Week 1:  PT Short Term Goal 1 (Week 1): Patient will perform basic transfers with CGA. ?PT Short Term Goal 2 (Week 1): Patient will propel w/c >50 ft with supervision. ? ?Skilled Therapeutic Interventions/Progress Updates: Pt presents supine in bed and agreeable to therapy.  Pt required min A for sup to sit transfers at EOB.  Pt required A to don shoes.  Pt then states need to use BSC.  Pt required min A for sit to stand transfers and SPT to [facility].  Pt continent of urine on BSC, but mod to max A to manage clothing 2/2 brief and colostomy. Pt then stood and transferred to w/c.  Pt performed LE there ex of 3 x 10-15 calf raises, LAQ, abd/add, isometric add in w/c or on EOB.  Nursing in during session to reconnect colostomy bag.  Pt required min A for sit to supine transfers for LEs. Pt assisted scooting to [facility] w/ use of siderails and B LEs.  Bed alarm on and all needs in reach. ?   ? ?Therapy Documentation ?Precautions:  ?Precautions ?Precautions: Fall ?Precaution Comments: colostomy; rt quadrant drain;  recent left tibia fx ?Restrictions ?Weight Bearing Restrictions: Yes ?LLE Weight Bearing: Weight bearing as tolerated ?Other Position/Activity Restrictions: WBAT for transfers only ?General: ?  ?Vital Signs: ?Oxygen Therapy ?SpO2: 100 % ?O2 Device: Nasal Cannula ?O2 Flow Rate (L/min): 3 L/min ?Pain: 6/10 abdominal ?Pain Assessment ?Pain Score: 3  ? ? ? ?Therapy/Group: Individual Therapy ? ?[name] ?01/14/2022, 12:51 PM  ?

## 2022-01-14 NOTE — Progress Notes (Signed)
Physical Therapy Session Note ? ?Patient Details  ?Name: Holly [name] ?MRN: [id number] ?Date of Birth: October 07, 1946 ? ?Today's Date: 01/14/2022 ?PT Individual Time: 2563-8937 ?PT Individual Time Calculation (min): 32 min ? Today's Date: 01/14/2022 ?PT Missed Time: 13 Minutes ?Missed Time Reason: Other (Comment) (assisting another patient) ? ?Short Term Goals: ?Week 1:  PT Short Term Goal 1 (Week 1): Patient will perform basic transfers with CGA. ?PT Short Term Goal 2 (Week 1): Patient will propel w/c >50 ft with supervision. ? ?Skilled Therapeutic Interventions/Progress Updates:  ? Arrived late to session due to assisting another pt. Received pt semi-reclined in bed, pt agreeable to PT treatment, and reported pain 5/10 in abdomen and mild nausea. Session with emphasis on functional mobility/transfers, toileting, dressing, generalized strengthening and endurance, and dynamic standing balance/coordination. Pt on 3L O2 via [place] with sats 98% sitting EOB. Pt transferred semi-reclined<>sitting EOB with min HHA with HOB elevated and use of bedrails. Pt doffed dirty shirt and donned clean one with supervision and stood x 4 reps from EOB with RW and min A to remove soiled brief, perform peri-care, and don clean brief - noted pt's ostomy bag completley full and with poor seal; RN notified. Donned pants, socks, and shoes sitting EOB with max A for time management and pt reported urge to void. Stand<>pivot bed<>bedside commode with RW and min A and required max A for clothing management. Pt able to void and required total A for peri-care in standing. Stand<>pivot bedside commode<>bed<>WC with RW and min A. Of note, pt required multiple rest breaks throughout session due to fatigue and repeatedly stating "I can't believe how much strength i've lost". Concluded session with pt stting in [facility] with RN present at bedside attending to care.  ? ?Therapy Documentation ?Precautions:  ?Precautions ?Precautions: Fall ?Precaution Comments:  colostomy; rt quadrant drain;  recent left tibia fx ?Restrictions ?Weight Bearing Restrictions: Yes ?LLE Weight Bearing: Weight bearing as tolerated ?Other Position/Activity Restrictions: WBAT for transfers only ? ?Therapy/Group: Individual Therapy ?Blenda Nicely ?Becky Sax PT, DPT  ?01/14/2022, 7:29 AM  ?

## 2022-01-14 NOTE — Progress Notes (Signed)
Patient states that she will place herself on CPAP when she is ready, nurse was in the room and said that he would be available too assist.  ?

## 2022-01-14 NOTE — Progress Notes (Signed)
Occupational Therapy Session Note ? ?Patient Details  ?Name: Holly [name] ?MRN: [id number] ?Date of Birth: [birth year] ? ?Today's Date: 01/14/2022 ?OT Individual Time: 6568-1275 ?OT Individual Time Calculation (min): 65 min  ? ? ?Short Term Goals: ?Week 1:  OT Short Term Goal 1 (Week 1): The pt will complete UB/LB bathing with with MinA using AE of a long hand sponge as needed ?OT Short Term Goal 2 (Week 1): The pt will complete UB/LB dressing with MinA using AE of a reacher for LE dressing as needed. ?OT Short Term Goal 3 (Week 1): The pt will present with UB strength of 3+/5 MMT for safe and ind task persormance ?OT Short Term Goal 4 (Week 1): The pt will complete toilet transfers with ModI for safe and independent function fot transitioning to home. ?OT Short Term Goal 5 (Week 1): The pt will complete sit to stand with S incorporating RW for ind and safety during functional task completeion. ? ?Skilled Therapeutic Interventions/Progress Updates:  ?  Pt received in bed anxious to get her hair washed. Obtained shampoo tray and supplies needed.  Pt worked on coming to sit EOB with min A.  Sit to stand to RW wit CGA.  Stand pivot to wc min A.  ? ?Pt positioned in wc at sink to complete hair care with A from OT.  Pt then positioned at sink to engage in UB bathing with supervision, UB dressing with set up.  Pt gets quite fatigued and needs frequent rest breaks.  Pt needed to urinate suddenly. Got BSC so she could do a stand pivot transfer. Pt getting very anxious about urinating on herself and started to rush the transfer. Reminded her she has a brief on and it is much more important to get there safely vs rushing. Cues to not try to lower pants before transfer but wait until she pivots.  Pt sat for a few minutes but only able to void a few drops of urine. Sit to stand several times to RW for changing brief, pulling up pants. Pt needs max A to get pants over hips.   ?Pivoted back to wc to dry and brush her hair.   ?Discussed having her get involved in her colostomy care now so when she goes home she will be fairly independent.   ?Pt agreed to stay up in wc to eat lunch.  Belt alarm on, call light in reach and all needs met.  ? ?Therapy Documentation ?Precautions:  ?Precautions ?Precautions: Fall ?Precaution Comments: colostomy; rt quadrant drain;  recent left tibia fx ?Restrictions ?Weight Bearing Restrictions: Yes ?LLE Weight Bearing: Weight bearing as tolerated ?Other Position/Activity Restrictions: WBAT for transfers only ? ?  ?Vital Signs: ?Therapy Vitals ?Temp: 98.7 ?F (37.1 ?C) ?Temp Source: Oral ?Pulse Rate: 71 ?Resp: 16 ?BP: (!) 162/62 ?Oxygen Therapy ?SpO2: 100 % ?Pain: ?c/o pain in abdomen from surgical site, premedicated ?  ?ADL: ?ADL ?Eating: Set up, Not assessed (based on functional task completion.) ?Where Assessed-Eating:  (based on functional task completion.) ?Grooming: Setup ?Where Assessed-Grooming: Edge of bed ?Upper Body Bathing: Minimal cueing ?Where Assessed-Upper Body Bathing: Edge of bed ?Lower Body Bathing: Maximal assistance ?Where Assessed-Lower Body Bathing: Edge of bed ?Upper Body Dressing: Minimal cueing ?Where Assessed-Upper Body Dressing: Edge of bed ?Lower Body Dressing: Maximal assistance ?Where Assessed-Lower Body Dressing: Edge of bed ?Toileting: Minimal assistance, Moderate assistance (Min/ModA with additional time.) ?Where Assessed-Toileting: Other (Comment) (based on functionat task completion) ?Toilet Transfer: Minimal assistance ?Toilet Transfer Method: Stand pivot ?Toilet  Transfer Equipment: Grab bars, Raised toilet seat ?Tub/Shower Transfer: Minimal assistance, Minimal cueing ?Tub/Shower Transfer Method:  (Additional assessing needed) ?Walk-In Shower Transfer:  (more assessing needed) ?Walk-In Shower Transfer Method: Stand pivot (patient demonstrates the ability to complete stand pivot based on her performance at EOB) ?Walk-In Shower Equipment:  (Patient has a walk in shower with a  shower chair to assist with performance.) ? ?Therapy/Group: Individual Therapy ? ?[place] ?01/14/2022, 3:47 PM ?

## 2022-01-14 NOTE — Progress Notes (Addendum)
Nursing reported concerns re: prior drain site. Small yellow eschar noted with some surrounding erythema. Will paint site with betadine TID. Sacral area examined and noted to have MASD with yeast overgrowth, peeling skin as well as DTI due to ruptured blister. Encouraged patient not to wear diaper in bed and will order air mattress overlay for pressure relief measures. Was started on diflucan this am. WOC consulted for input ? ? ?  ?

## 2022-01-14 NOTE — Progress Notes (Signed)
?                                                       PROGRESS NOTE ? ? ?Subjective/Complaints: ? ? ?Pt reports her colostomy bag makes al ot of noise and abdomen is real sore.  ?Also has chronic back pain.  ?Found to have yeast in U/A from yesterday.  ? ?Surgery was 3/21- so not due to get staples out yet.  ? ?ROS: ? ?Pt denies SOB, mild abd pain, CP, N/V/C/D, and vision changes ? ? ?Objective: ?  ?No results found. ?Recent Labs  ?  01/12/22 ?0345 01/13/22 ?0507  ?WBC 13.1* 9.8  ?HGB 10.3* 9.4*  ?HCT 34.1* 31.0*  ?PLT 219 266  ? ?Recent Labs  ?  01/12/22 ?0345 01/13/22 ?0507  ?NA 139 142  ?K 3.8 3.7  ?CL 101 104  ?CO2 31 31  ?GLUCOSE 110* 86  ?BUN 11 8  ?CREATININE 0.66 0.82  ?CALCIUM 8.6* 8.8*  ? ? ?Intake/Output Summary (Last 24 hours) at 01/14/2022 1457 ?Last data filed at 01/14/2022 1324 ?Gross per 24 hour  ?Intake 150 ml  ?Output 775 ml  ?Net -625 ml  ?  ? ?Pressure Injury 01/06/22 Sacrum Stage 2 -  Partial thickness loss of dermis presenting as a shallow open injury with a red, pink wound bed without slough. (Active)  ?01/06/22 1603  ?Location: Sacrum  ?Location Orientation:   ?Staging: Stage 2 -  Partial thickness loss of dermis presenting as a shallow open injury with a red, pink wound bed without slough.  ?Wound Description (Comments):   ?Present on Admission:   ? ? ?Physical Exam: ?Vital Signs ?Blood pressure (!) 162/62, pulse 71, temperature 98.7 ?F (37.1 ?C), temperature source Oral, resp. rate 16, height 5' (1.524 m), weight 64.7 kg, SpO2 100 %. ? ? ? ?General: awake, alert, appropriate, NAD ?HENT: conjugate gaze; oropharynx moist- O2 by [place] ?CV: regular rate; no JVD ?Pulmonary: CTA B/L; no W/R/R- decreased at bases B/L  ?GI: soft, mildly TTP- colectomy incision noted- looks great- colostomy bag almost full- grainy loose stool ?Psychiatric: appropriate ?Neurological: alert ?Skin: ?  General: Skin is warm and dry.  ?   Comments: Midline incision and left lower extremity incisions helaing without signs  of infection  ?Neurological:  ?   Mental Status: She is alert and oriented to person, place, and time.  ?   Motor: Weakness present.  ?   Comments: Weak dorsiflexion of right foot  ? ?Assessment/Plan: ?1. Functional deficits which require 3+ hours per day of interdisciplinary therapy in a comprehensive inpatient rehab setting. ?Physiatrist is providing close team supervision and 24 hour management of active medical problems listed below. ?Physiatrist and rehab team continue to assess barriers to discharge/monitor patient progress toward functional and medical goals ? ?Care Tool: ? ?Bathing ?   ?Body parts bathed by patient: Right arm, Left arm, Chest, Abdomen, Front perineal area, Buttocks, Face, Right upper leg, Left upper leg  ? Body parts bathed by helper: Right lower leg, Left lower leg ?  ?  ?Bathing assist Assist Level: Minimal Assistance - Patient > 75% ?  ?  ?Upper Body Dressing/Undressing ?Upper body dressing   ?What is the patient wearing?: Pull over shirt ?   ?Upper body assist Assist Level: Minimal Assistance - Patient > 75% (for pull  over top) ?   ?Lower Body Dressing/Undressing ?Lower body dressing ? ? ?   ?What is the patient wearing?: Incontinence brief ? ?  ? ?Lower body assist Assist for lower body dressing: Moderate Assistance - Patient 50 - 74% (Mod for LB donning of pants) ?   ? ?Toileting ?Toileting    ?Toileting assist Assist for toileting: Moderate Assistance - Patient 50 - 74% ?  ?  ?Transfers ?Chair/bed transfer ? ?Transfers assist ?   ? ?Chair/bed transfer assist level: Minimal Assistance - Patient > 75% ?  ?  ?Locomotion ?Ambulation ? ? ?Ambulation assist ? ? Ambulation activity did not occur: Safety/medical concerns (only cleared for transfers at this time) ? ?  ?  ?   ? ?Walk 10 feet activity ? ? ?Assist ? Walk 10 feet activity did not occur: Safety/medical concerns ? ?  ?   ? ?Walk 50 feet activity ? ? ?Assist Walk 50 feet with 2 turns activity did not occur: Safety/medical  concerns ? ?  ?   ? ? ?Walk 150 feet activity ? ? ?Assist Walk 150 feet activity did not occur: Safety/medical concerns ? ?  ?  ?  ? ?Walk 10 feet on uneven surface  ?activity ? ? ?Assist Walk 10 feet on uneven surfaces activity did not occur: Safety/medical concerns ? ? ?  ?   ? ?Wheelchair ? ? ? ? ?Assist Is the patient using a wheelchair?: Yes ?Type of Wheelchair: Manual ?  ? ?Wheelchair assist level: Dependent - Patient 0% (limited by pain) ?   ? ? ?Wheelchair 50 feet with 2 turns activity ? ? ? ?Assist ? ?  ?  ? ? ?Assist Level: Dependent - Patient 0%  ? ?Wheelchair 150 feet activity  ? ? ? ?Assist ?   ? ? ?Assist Level: Dependent - Patient 0%  ? ?Blood pressure (!) 162/62, pulse 71, temperature 98.7 ?F (37.1 ?C), temperature source Oral, resp. rate 16, height 5' (1.524 m), weight 64.7 kg, SpO2 100 %. ? ?Medical Problem List and Plan: ?1. Functional deficits secondary to perforated diverticulum, exploratory laparotomy with colostomy, recent tibial plateau fracture repair. ?            -patient may shower but colostomy must be covered ?            -ELOS/Goals: MinA/S 12-16 days     ?            Continue CIR- PT, OT -  ?2.  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            -antiplatelet therapy: none ?3. Pain: continue Tylenol, oxycodone PRN ?            --Robaxin 500 mg q 6 hours ? 3/31- has chronic back pain, but controlled overall- con't regimen ?4. Mood: LCSW to evaluate and provide emotional support ?            -continue Wellbutrin XL 300 mg q AM ?            -continue Buspar 5 mg BID ?            --continue Xanax 0.5 mg prn anxiety ?            -antipsychotic agents: n/a ?5. Neuropsych: This patient is capable of making decisions on her own behalf. ?6. Skin/Wound Care: Routine skin care checks ?            --  Monitor midline incision, LLE incisions ?            -- Routine stoma care ?            --Monitor RUE bullous  lesions ? 3/31- staples not ready to come out- surgery was 3/21 ?7. 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 ?10: COPD: continue chronic prednisone 20 mg daily ?            -continue Breo Ellipta + Incruse Ellipta ?            --albuterol nebs prn ? 3/30- on O2 3L -home O2- con't ?11: OSA: continue CPAP nightly; ? O2 bleed in ?12: Recent repair of left tibial plateau fracture with bone biopsies ?            --s/p ORIF by Dr Doreatha Martin 12/13/2021 ?            --weight bearing with transfers only; unrestricted ROM ?            --bone culture +MRSA ?            --continue daptomycin through 4/26 ?            --follow-up with ID ? 3/30- WBC down to 9.4 from 13k- will con't to monitor and recheck ESR and CRP weekly.  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 Cognitive decline: on Aricept ?17: Hyperlipidemia: continue Lipitor ?18: Insomnia/poor sleep hygiene: continue Remeron 30 mg q HS ? 3/31- sleeping stable- con't regimen ?19: Right foot weakness: PRAFO boot ?20: Glossitis, possible thrush: she is requesting Nystatin s/s ?21. Dysuria/Yeast UTI- and urgency possible UTI ? 3/30- will check U/A and Cx- checked 3/27- but could be positive now. Will allow Purewick at night for now.  ? 3/31- is yeast UTI- will give Diflucan 200 mg x1 and 100 mg daily x 6 days- hopefully will help dysuria ? ? ?I spent a total of  35  minutes on total care today- >50% coordination of care- due to IPOC, and dealing with yeast- also d/w nursing about yeast UTI ? ?LOS: ?2 days ?A FACE TO FACE EVALUATION WAS PERFORMED ? ?Gaudencio Chesnut ?01/14/2022, 2:57 PM  ? ? ? ?

## 2022-01-14 NOTE — IPOC Note (Addendum)
Overall Plan of Care (IPOC) ?Patient Details ?Name: Holly [name] ?MRN: [id number] ?DOB: 03-Nov-1945 ? ?Admitting Diagnosis: Debility ? ?Hospital Problems: Principal Problem: ?  Debility ? ? ? ? Functional Problem List: ?Nursing Medication Management, Safety, Bladder, Bowel, Endurance, Pain, Skin Integrity, Nutrition  ?PT Balance, Safety, Edema, Endurance, Motor, Nutrition, Pain  ?OT Balance, Endurance, Motor, Safety, Pain  ?SLP    ?TR    ?    ? Basic ADL?s: ?OT Bathing, Dressing, Toileting  ? ?  Advanced  ADL?s: ?OT    ?   ?Transfers: ?PT Bed Mobility, Bed to Chair, Car, Furniture  ?OT Tub/Shower  ? ?  Locomotion: ?PT Ambulation, Wheelchair Mobility, Stairs  ? ?  Additional Impairments: ?OT    ?SLP   ?  ?   ?TR    ? ? ?Anticipated Outcomes ?Item Anticipated Outcome  ?Self Feeding s/u  ?Swallowing ?   ?  ?Basic self-care ? ModI  ?Toileting ? ModI ?  ?Bathroom Transfers MosI  ?Bowel/Bladder ? manage bowel w mod I assist and bladder w toileting  ?Transfers ? mod I using LRAD  ?Locomotion ? mod I w/c level  ?Communication ?    ?Cognition ?    ?Pain ? pain at or below level 4 w prns  ?Safety/Judgment ? maintain safety w cues  ? ?Therapy Plan: ?PT Intensity: Minimum of 1-2 x/day ,45 to 90 minutes ?PT Frequency: 5 out of 7 days ?PT Duration Estimated Length of Stay: 10-14 days ?OT Intensity: Minimum of 1-2 x/day, 45 to 90 minutes ?OT Frequency: 5 out of 7 days ?OT Duration/Estimated Length of Stay: 10-14 days ?   ? ?Due to the current state of emergency, patients may not be receiving their 3-hours of Medicare-mandated therapy. ? ? Team Interventions: ?Nursing Interventions Bladder Management, Disease Management/Prevention, Medication Management, Discharge Planning, Skin Care/Wound Management, Pain Management, Bowel Management, Patient/Family Education  ?PT interventions Training and development officer, Community reintegration, Discharge planning, Disease management/prevention, Functional electrical stimulation, DME/adaptive  equipment instruction, Functional mobility training, Neuromuscular re-education, Pain management, Patient/family education, Psychosocial support, Skin care/wound management, Splinting/orthotics, Therapeutic Activities, Therapeutic Exercise, UE/LE Strength taining/ROM, UE/LE Coordination activities, Wheelchair propulsion/positioning  ?OT Interventions Self Care/advanced ADL retraining, Therapeutic Exercise, Pain management, UE/LE Strength taining/ROM, Therapeutic Activities, Patient/family education  ?SLP Interventions    ?TR Interventions    ?SW/CM Interventions Discharge Planning, Psychosocial Support, Patient/Family Education  ? ?Barriers to Discharge ?MD  Medical stability, Home enviroment access/loayout, Incontinence, Neurogenic bowel and bladder, Weight, Weight bearing restrictions, Nutritional means, and home O2 and new colostomy10-  ?Nursing Decreased caregiver support ?1 level mobile home, ramped entry ; has DME, dtr works, son can assist  ?PT Decreased caregiver support, Lack of/limited family support, Weight bearing restrictions ?   ?OT   ?   ?SLP   ?   ?SW   ?   ? ?Team Discharge Planning: ?Destination: PT-Home ,OT- Home  , SLP-  ?Projected Follow-up: PT-Home health PT, OT-  Home Health OT , SLP-  ?Projected Equipment Needs: PT-Wheelchair cushion (measurements), Wheelchair (measurements), OT- Rolling walker with 5" wheels, SLP-  ?Equipment Details: PT-18"x16" light weight w/c with foam cushion, pt has RW, OT-patient has AE and handicap assessible  mobile unit ?Patient/family involved in discharge planning: PT- Patient, Family member/caregiver,  OT-Patient, SLP-  ? ?MD ELOS: 10-14 days ?Medical Rehab Prognosis:  Good ?Assessment: The patient has been admitted for CIR therapies with the diagnosis of debility due to colectomy/partial and colostomy- has home O2 and multiple other medical issues. The team will  be addressing functional mobility, strength, stamina, balance, safety, adaptive techniques and  equipment, self-care, bowel and bladder mgt, patient and caregiver education, colostomy education. Goals have been set at mod I. Anticipated discharge destination is home. ? ?Due to the current state of emergency, patients may not be receiving their 3 hours per day of Medicare-mandated therapy.  ? ? ? ?  ? ? ?See Team Conference Notes for weekly updates to the plan of care ? ?

## 2022-01-15 NOTE — Progress Notes (Signed)
Occupational Therapy Session Note ? ?Patient Details  ?Name: Holly [name] ?MRN: [id number] ?Date of Birth: 05-Oct-1946 ? ?Today's Date: 01/15/2022 ?OT Individual Time: 4193-7902 ?OT Individual Time Calculation (min): 56 min  ? ?Today's Date: 01/15/2022 ?OT Individual Time: 4097-3532 ?OT Individual Time Calculation (min): 21 min  and Today's Date: 01/15/2022 ?OT Missed Time: 9 Minutes ?Missed Time Reason: Patient fatigue ? ?Short Term Goals: ?Week 1:  OT Short Term Goal 1 (Week 1): The pt will complete UB/LB bathing with with MinA using AE of a long hand sponge as needed ?OT Short Term Goal 2 (Week 1): The pt will complete UB/LB dressing with MinA using AE of a reacher for LE dressing as needed. ?OT Short Term Goal 3 (Week 1): The pt will present with UB strength of 3+/5 MMT for safe and ind task persormance ?OT Short Term Goal 4 (Week 1): The pt will complete toilet transfers with ModI for safe and independent function fot transitioning to home. ?OT Short Term Goal 5 (Week 1): The pt will complete sit to stand with S incorporating RW for ind and safety during functional task completeion. ? ?Skilled Therapeutic Interventions/Progress Updates:  ?   ?Pt received in bed with 7 out of 10 pain in abdomen. Rest and repositioning provided for pain relief Pt reproting nausea and pain in buttocks increasing both with sitting EOB. Pt agreeable to "take it slow" this morning ? ?ADL: ?Pt completes ADL at overall set up for UB Level. Skilled interventions include: education on energy conservation, progression with rehab, VC for functional transfers, sitting balance unsupported with BLE towork trunk control, and demo/education on emptying the colostomy. Educated that at home she could empty colostomy into toilet, however here we are measureing her output so she will need to empty into a container. Pt completes SPT with RW to/from w/c wht CGA. Encouragement provided as pt states, "I just feel so bad today and I was doing so well  yesterday!" ? ? ?Therapeutic activity ?Edu re roho cushion reasoning and asking MD if lying on side is an option to promote buttock healing. Pt verbalized understanding and provided with roho cushion. Edu re putting in recliner if going to sit up in the w/c. Pt verbalized understanding. ? ?Pt left at end of session in bed with exit alarm on, call light in reach and all needs met ?Session 2: ? ?Pt received in bed with fatigue and nausea remaining, initially declining tx d/t fatigue, however reporting needing to empty colostomy.  ?ADL: ?Initially this clinician asked pt to verbalize the steps of emptying colostomy bag. Pt able to easily describe and even initiates unfolding bag. Provided with gloves, pt empties bag with min cuing and MIN A to hold the container in place. Pt able to clean off edges and requres instruction to fold correctly. Pt happy to practice. Pt declines any other intervention d/t fatigue. ? ?Pt left at end of session in bed with exit alarm on, call light in reach and all needs met ? ? ?Therapy Documentation ?Precautions:  ?Precautions ?Precautions: Fall ?Precaution Comments: colostomy; rt quadrant drain;  recent left tibia fx ?Restrictions ?Weight Bearing Restrictions: Yes ?LLE Weight Bearing: Weight bearing as tolerated ?Other Position/Activity Restrictions: WBAT for transfers only ?General: ?  ?Vital Signs: ?Therapy Vitals ?Temp: 98.1 ?F (36.7 ?C) ?Pulse Rate: 63 ?Resp: 14 ?BP: (!) 162/66 ?Patient Position (if appropriate): Lying ?Oxygen Therapy ?SpO2: 99 % ?O2 Device: Room Air ? ? ?Therapy/Group: Individual Therapy ? ?Lowella Dell Livi Mcgann ?01/15/2022, 6:49 AM ?

## 2022-01-15 NOTE — Progress Notes (Signed)
Physical Therapy Session Note ? ?Patient Details  ?Name: Holly [name] ?MRN: [id number] ?Date of Birth: Nov 04, 1945 ? ?Today's Date: 01/15/2022 ?PT Individual Time: 4481-8563 ?PT Individual Time Calculation (min): 56 min  ? ?Short Term Goals: ?Week 1:  PT Short Term Goal 1 (Week 1): Patient will perform basic transfers with CGA. ?PT Short Term Goal 2 (Week 1): Patient will propel w/c >50 ft with supervision. ? ? ?Skilled Therapeutic Interventions/Progress Updates:  ?Patient supine in bed on entrance to room. Dtr present. Patient alert and initially c/o continued discomfort from n/v this day, but is agreeable to gentle PT session.  ? ?Patient with minimal abdominal pain complaint at start of session. ? ?Therapeutic Activity: ?Bed Mobility: Patient performed supine --> sit with CGA/ light MinA for UB. Return to supine required light MinA for BLE to bed surface.  VC/ tc required for effort, coordinated abdominal breathing and technique. Bed elevated to height for pt to produce modified stance for reduced WB in stance and participate in activity. At end of session, requires Mod A for move toward [facility] with pt able to assist with BLE push.  ?Transfers: Pt performed stand pivot using RW to reach [facility] placed next to bed. Requires CGA for transfer to and then from [facility] Setup for pericare, MinA for clothing mgmt.  ? ?Neuromuscular Re-ed: ?NMR facilitated during session with focus on sitting balance/ tolerance, cardiorespiratory endurance. Pt guided in toss of beanbags to large bin placed across room from bed (bed rearranged to provide longer distance to cover). Pt relates fatigue throughout throwing activity but is able to weight shift on feet, produce forward flexion as well as trunk extension for momentum prior to throw. Good balance noted throughout.  NMR performed for improvements in motor control and coordination, balance, sequencing, judgement, and self confidence/ efficacy in performing all aspects of mobility at highest  level of independence.  ? ?Patient supine  in bed at end of session with brakes locked, bed alarm set, and all needs within reach. ? ? ?Therapy Documentation ?Precautions:  ?Precautions ?Precautions: Fall ?Precaution Comments: colostomy; rt quadrant drain;  recent left tibia fx ?Restrictions ?Weight Bearing Restrictions: Yes ?LLE Weight Bearing: Weight bearing as tolerated ?Other Position/Activity Restrictions: WBAT for transfers only ?General: ?  ?Vital Signs: ? ?Pain: ? No n/v pain this session. Minimal abdominal pain throughout session.  ? ?Therapy/Group: Individual Therapy ? ?[name] ?01/15/2022, 6:02 PM  ?

## 2022-01-15 NOTE — Progress Notes (Signed)
Followed up with portable for overlay air mattress. Will be delivered in AM per portable. ?

## 2022-01-15 NOTE — Progress Notes (Signed)
Physical Therapy Session Note ? ?Patient Details  ?Name: Holly [name] ?MRN: [id number] ?Date of Birth: 02-May-1946 ? ?Today's Date: 01/15/2022 ?PT Individual Time: 1638-4665 ?PT Individual Time Calculation (min): 35 min  and Today's Date: 01/15/2022 ?PT Missed Time: 10 Minutes ?Missed Time Reason: Patient ill (Comment);Patient fatigue ? ?Short Term Goals: ?Week 1:  PT Short Term Goal 1 (Week 1): Patient will perform basic transfers with CGA. ?PT Short Term Goal 2 (Week 1): Patient will propel w/c >50 ft with supervision. ? ?Skilled Therapeutic Interventions/Progress Updates:  ?  Patient received supine in bed, agreeable to PT. She reports 7/10 pain in her abdomen, premedicated, and nausea. PT providing rest breaks, distractions and repositioning to assist with pain management and alerting RN to nausea. Patient requesting to transfer to [facility] to urinate. Supervision to come sit EOB. [place] stand pivot to [facility] with HHA and TotalA for clothing management in standing. Patient with continent void. TotalA for perihygiene and clothing management in standing. Patient requesting to transfer back to bed due to fatigue and increasing nausea. Patient requiring ModA to return supine due to pain. RN confirming that patient needs to have at least brief off while in bed to allow for proper healing of PI to bottom. Patient able to bridge to doff pants and brief. Remaining up in bed, bed alarm on, call light within reach, positioned on L side to allow for pressure relief. RN aware of patients tolerance.  ? ?Therapy Documentation ?Precautions:  ?Precautions ?Precautions: Fall ?Precaution Comments: colostomy; rt quadrant drain;  recent left tibia fx ?Restrictions ?Weight Bearing Restrictions: Yes ?LLE Weight Bearing: Weight bearing as tolerated ?Other Position/Activity Restrictions: WBAT for transfers only ? ? ? ? ?Therapy/Group: Individual Therapy ? ?Debbora Dus ?Debbora Dus, PT, DPT, CBIS' ? ?01/15/2022, 7:34 AM  ?

## 2022-01-16 DIAGNOSIS — R5381 Other malaise: Secondary | ICD-10-CM | POA: Diagnosis not present

## 2022-01-16 NOTE — Progress Notes (Signed)
Pt states she will self admin CPAP. RT will cont to monitor ?

## 2022-01-16 NOTE — Progress Notes (Signed)
?                                                       PROGRESS NOTE ? ? ?Subjective/Complaints: ? ?Still sore left abd, some nausea , appetite has been diminished .  Wears O2 3L at home, breathing ok , CPAP at noc ? ?ROS: ? ?Pt denies SOB, mild abd pain, CP, N/V/C/D, and vision changes ? ? ?Objective: ?  ?No results found. ?No results for input(s): WBC, HGB, HCT, PLT in the last 72 hours. ? ?No results for input(s): NA, K, CL, CO2, GLUCOSE, BUN, CREATININE, CALCIUM in the last 72 hours. ? ? ?Intake/Output Summary (Last 24 hours) at 01/16/2022 1018 ?Last data filed at 01/16/2022 0730 ?Gross per 24 hour  ?Intake 708 ml  ?Output 801 ml  ?Net -93 ml  ? ?  ? ?Pressure Injury 01/06/22 Sacrum Deep Tissue Pressure Injury - Purple or maroon localized area of discolored intact skin or blood-filled blister due to damage of underlying soft tissue from pressure and/or shear. (Active)  ?01/06/22 1603  ?Location: Sacrum  ?Location Orientation:   ?Staging: Deep Tissue Pressure Injury - Purple or maroon localized area of discolored intact skin or blood-filled blister due to damage of underlying soft tissue from pressure and/or shear.  ?Wound Description (Comments):   ?Present on Admission: Yes  ? ? ?Physical Exam: ?Vital Signs ?Blood pressure (!) 179/71, pulse 65, temperature 98.5 ?F (36.9 ?C), resp. rate 18, height 5' (1.524 m), weight 64.7 kg, SpO2 100 %. ? ?General: No acute distress ?Mood and affect are appropriate ?Heart: Regular rate and rhythm no rubs murmurs or extra sounds ?Lungs: Clear to auscultation, breathing unlabored, no rales or wheezes ?Abdomen: Positive bowel sounds, tender to palpation LLQ, nondistended, LLQ colostomy , midline abd incision  ?Extremities: No clubbing, cyanosis, or edema ?Skin: No evidence of breakdown, no evidence of rash ? ? ?Neurological: alert ?Skin: ?  General: Skin is warm and dry.  ?   Comments: Midline incision and left lower extremity incisions helaing without signs of infection   ?Neurological:  ?   Mental Status: She is alert and oriented to person, place, and time.  ?   Motor: Weakness present.  ?   Comments: Weak dorsiflexion of right foot  ? ?Assessment/Plan: ?1. Functional deficits which require 3+ hours per day of interdisciplinary therapy in a comprehensive inpatient rehab setting. ?Physiatrist is providing close team supervision and 24 hour management of active medical problems listed below. ?Physiatrist and rehab team continue to assess barriers to discharge/monitor patient progress toward functional and medical goals ? ?Care Tool: ? ?Bathing ?   ?Body parts bathed by patient: Right arm, Left arm, Chest, Abdomen, Front perineal area, Buttocks, Face, Right upper leg, Left upper leg  ? Body parts bathed by helper: Right lower leg, Left lower leg ?  ?  ?Bathing assist Assist Level: Minimal Assistance - Patient > 75% ?  ?  ?Upper Body Dressing/Undressing ?Upper body dressing   ?What is the patient wearing?: Pull over shirt ?   ?Upper body assist Assist Level: Minimal Assistance - Patient > 75% ?   ?Lower Body Dressing/Undressing ?Lower body dressing ? ? ?   ?What is the patient wearing?: Incontinence brief ? ?  ? ?Lower body assist Assist for lower body dressing: Moderate Assistance - Patient 50 -  74% ?   ? ?Toileting ?Toileting    ?Toileting assist Assist for toileting: Moderate Assistance - Patient 50 - 74% ?  ?  ?Transfers ?Chair/bed transfer ? ?Transfers assist ?   ? ?Chair/bed transfer assist level: Minimal Assistance - Patient > 75% ?  ?  ?Locomotion ?Ambulation ? ? ?Ambulation assist ? ? Ambulation activity did not occur: Safety/medical concerns ? ?  ?  ?   ? ?Walk 10 feet activity ? ? ?Assist ? Walk 10 feet activity did not occur: Safety/medical concerns ? ?  ?   ? ?Walk 50 feet activity ? ? ?Assist Walk 50 feet with 2 turns activity did not occur: Safety/medical concerns ? ?  ?   ? ? ?Walk 150 feet activity ? ? ?Assist Walk 150 feet activity did not occur: Safety/medical  concerns ? ?  ?  ?  ? ?Walk 10 feet on uneven surface  ?activity ? ? ?Assist Walk 10 feet on uneven surfaces activity did not occur: Safety/medical concerns ? ? ?  ?   ? ?Wheelchair ? ? ? ? ?Assist Is the patient using a wheelchair?: Yes ?Type of Wheelchair: Manual ?  ? ?Wheelchair assist level: Dependent - Patient 0% (limited by pain) ?   ? ? ?Wheelchair 50 feet with 2 turns activity ? ? ? ?Assist ? ?  ?  ? ? ?Assist Level: Dependent - Patient 0%  ? ?Wheelchair 150 feet activity  ? ? ? ?Assist ?   ? ? ?Assist Level: Dependent - Patient 0%  ? ?Blood pressure (!) 179/71, pulse 65, temperature 98.5 ?F (36.9 ?C), resp. rate 18, height 5' (1.524 m), weight 64.7 kg, SpO2 100 %. ? ?Medical Problem List and Plan: ?1. Functional deficits secondary to perforated diverticulum, exploratory laparotomy with colostomy, recent tibial plateau fracture repair. ?            -patient may shower but colostomy must be covered ?            -ELOS/Goals: MinA/S 12-16 days     ?            Continue CIR- PT, OT -  ?2.  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            -antiplatelet therapy: none ?3. Pain: continue Tylenol, oxycodone PRN ?            --Robaxin 500 mg q 6 hours ? 3/31- has chronic back pain, but controlled overall- con't regimen ?4. Mood: LCSW to evaluate and provide emotional support ?            -continue Wellbutrin XL 300 mg q AM ?            -continue Buspar 5 mg BID ?            --continue Xanax 0.5 mg prn anxiety ?            -antipsychotic agents: n/a ?5. Neuropsych: This patient is capable of making decisions on her own behalf. ?6. Skin/Wound Care: Routine skin care checks ?            -- Monitor midline incision, LLE incisions ?            -- Routine stoma care ?            --Monitor RUE bullous lesions ? 3/31- staples not ready to come out- surgery was 3/21- likely d/c next week  ?7. 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 ?10: COPD: continue chronic prednisone 20 mg daily ?            -continue Breo Ellipta + Incruse Ellipta ?            --albuterol nebs prn ? 3/30- on O2 3L -home O2- con't ?11: OSA: continue CPAP nightly; ? O2 bleed in ?12: Recent repair of left tibial plateau fracture with bone biopsies ?            --s/p ORIF by Dr Doreatha Martin 12/13/2021 ?            --weight bearing with transfers only; unrestricted ROM ?            --bone culture +MRSA ?            --continue daptomycin through 4/26 ?            --follow-up with ID ? 3/30- WBC down to 9.4 from 13k- will con't to monitor and recheck ESR and CRP weekly.  ?13: Hypertension: continue Toprol XL 50 mg daily ?Vitals:  ? 01/16/22 0536 01/16/22 0900  ?BP: (!) 179/71   ?Pulse: 65   ?Resp: 18   ?Temp: 98.5 ?F (36.9 ?C)   ?SpO2: 100% 100%  ?HR controlled but elevated sys BP- resume cozaar  ? 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 ?  Latest Ref Rng & Units 01/13/2022  ?  5:07 AM 01/12/2022  ?  3:45 AM 01/11/2022  ?  1:45 AM  ?BMP  ?Glucose 70 - 99 mg/dL 86   110   138    ?BUN 8 - 23 mg/dL _0 ?Creatinine 0.44 - 1.00 mg/dL 0.82   0.66   0.74    ?Sodium 135 - 145 mmol/L 142   139   139    ?Potassium 3.5 - 5.1 mmol/L 3.7   3.8   3.5    ?Chloride 98 - 111 mmol/L 104   101   102    ?CO2 22 - 32 mmol/L 31   31   32    ?Calcium 8.9 - 10.3 mg/dL 8.8   8.6   8.3    ? ?Renal fx stable should be able to resume cozaar  ?15: Hypothyroidism: continue Synthroid ?16: ? Cognitive decline: on Aricept ?17: Hyperlipidemia: continue Lipitor ?18: Insomnia/poor sleep hygiene: continue Remeron 30 mg q HS ? 3/31- sleeping stable- con't regimen ?19: Right foot weakness: PRAFO boot ?20: Glossitis, possible thrush: she is requesting Nystatin s/s ?21. Dysuria/Yeast UTI- and urgency possible UTI ? 3/30- will check U/A and Cx- checked 3/27- but could be positive now.  Will allow Purewick at night for now.  ? 3/31- is yeast UTI- will give Diflucan 200 mg x1 and 100 mg daily x 6 days- hopefully will help dysuria ? ? ?LOS: ?4 days ?A FACE TO FACE EVALUATION WAS PERFORMED ? ?Mitzi Hansen E Kir

## 2022-01-17 DIAGNOSIS — R5381 Other malaise: Secondary | ICD-10-CM | POA: Diagnosis not present

## 2022-01-17 LAB — CBC WITH DIFFERENTIAL/PLATELET
Abs Immature Granulocytes: 0.12 10*3/uL — ABNORMAL HIGH (ref 0.00–0.07)
Basophils Absolute: 0.1 10*3/uL (ref 0.0–0.1)
Basophils Relative: 0 %
Eosinophils Absolute: 0.2 10*3/uL (ref 0.0–0.5)
Eosinophils Relative: 2 %
HCT: 35.9 % — ABNORMAL LOW (ref 36.0–46.0)
Hemoglobin: 10.4 g/dL — ABNORMAL LOW (ref 12.0–15.0)
Immature Granulocytes: 1 %
Lymphocytes Relative: 18 %
Lymphs Abs: 2.2 10*3/uL (ref 0.7–4.0)
MCH: 28.2 pg (ref 26.0–34.0)
MCHC: 29 g/dL — ABNORMAL LOW (ref 30.0–36.0)
MCV: 97.3 fL (ref 80.0–100.0)
Monocytes Absolute: 0.6 10*3/uL (ref 0.1–1.0)
Monocytes Relative: 5 %
Neutro Abs: 9.1 10*3/uL — ABNORMAL HIGH (ref 1.7–7.7)
Neutrophils Relative %: 74 %
Platelets: 266 10*3/uL (ref 150–400)
RBC: 3.69 MIL/uL — ABNORMAL LOW (ref 3.87–5.11)
RDW: 18.8 % — ABNORMAL HIGH (ref 11.5–15.5)
WBC: 12.3 10*3/uL — ABNORMAL HIGH (ref 4.0–10.5)
nRBC: 0 % (ref 0.0–0.2)

## 2022-01-17 LAB — C-REACTIVE PROTEIN: CRP: 6.8 mg/dL — ABNORMAL HIGH (ref <1.0)

## 2022-01-17 LAB — BASIC METABOLIC PANEL
Anion gap: 7 (ref 5–15)
BUN: 10 mg/dL (ref 8–23)
CO2: 30 mmol/L (ref 22–32)
Calcium: 9 mg/dL (ref 8.9–10.3)
Chloride: 100 mmol/L (ref 98–111)
Creatinine, Ser: 0.65 mg/dL (ref 0.44–1.00)
GFR, Estimated: >60 mL/min (ref >60)
Glucose, Bld: 97 mg/dL (ref 70–99)
Potassium: 4 mmol/L (ref 3.5–5.1)
Sodium: 137 mmol/L (ref 135–145)

## 2022-01-17 LAB — SEDIMENTATION RATE: Sed Rate: 4 mm/hr (ref 0–22)

## 2022-01-17 LAB — CK: Total CK: 14 U/L — ABNORMAL LOW (ref 38–234)

## 2022-01-17 MED ORDER — MEDIHONEY WOUND/BURN DRESSING EX PSTE
1.0000 "application " | PASTE | Freq: Every day | CUTANEOUS | Status: DC
  Administered 2022-01-17 – 2022-01-27 (×10): 1 via TOPICAL
  Filled 2022-01-17 (×2): qty 44

## 2022-01-17 NOTE — Consult Note (Signed)
WOC Nurse Consult Note: ?Reason for Consult:unstageable pressure injury to coccyx with surrounding peeling epithelium to sacral area. First noted on 01/06/22.  Patient refuses to wear purewick external urinary manager and skin is frequently moist. She has good bed mobility and turns self in bed with minimal assistance. LLQ colostomy placed 01/01/22  Patient and her daughter have received ostomy teaching. Discharge disposition will most likely be SNF   ?Wound type: moisture and pressure  ?Pressure Injury POA: NO ?Measurement: Fibrin to coccyx:  2 cm x 1.5 cm wound bed 100% fibrin slough ?Surrounding partial thickness injury consistent with moisture associated skin damage and pressure extends 4 cm circumferentially and includes gluteal folds.  Patient is wearing a brief per her request.  I explain that she is on a low air loss mattress to redistribute pressure and manage moisture. I am not sure she will agree to be without a brief.   ?Wound bed: slough ?Drainage (amount, consistency, odor) minimal serosanguinous  skin smells of urine. NT to bathe at this time.  ?Periwound:erythema and peeling epithelium ?Dressing procedure/placement/frequency: Cleanse sacrococcygeal wound with NS and pat dry. Apply medihoney to coccyx wound (slough) and cover with foam dressing.  Peel back foam and reapply medihoney daily.  Change foam everythree days and PRN soilage.  ?[place] Nurse ostomy follow up ?Stoma type/location: LLQ colostomy ?Stomal assessment/size: 1 3/4" pink patent and producing soft brown stool.  ?Peristomal assessment: intact ?Treatment options for stomal/peristomal skin: barrier ring and 2 3/4" 2 piece pouch ?Output soft brown stool ?Ostomy pouching: 2pc.  ?Education provided: NOne today.  ?Enrolled patient in [facility] Discharge program: Yes ?Will not follow at this time.  Please re-consult if needed.  ?Domenic Moras MSN, RN, FNP-BC CWON ?Wound, Ostomy, Continence Nurse ?Pager [phone]  ?  ?

## 2022-01-17 NOTE — Progress Notes (Signed)
?                                                       PROGRESS NOTE ? ? ?Subjective/Complaints: ? ?Pt reports took pain meds at 3am- slept till then.  ?No issues- colostomy working well- says she hasn't been trained in removing it/changing it- only in emptying it.  ?WOC - didn't see a note, so will consult to double check.  ? ?WBC 12.3- afebrile.  ? ?ROS: ? ?Pt denies SOB, abd pain, CP, N/V/C/D, and vision changes ? ?Objective: ?  ?No results found. ?Recent Labs  ?  01/17/22 ?6073  ?WBC 12.3*  ?HGB 10.4*  ?HCT 35.9*  ?PLT 266  ? ?Recent Labs  ?  01/17/22 ?7106  ?NA 137  ?K 4.0  ?CL 100  ?CO2 30  ?GLUCOSE 97  ?BUN 10  ?CREATININE 0.65  ?CALCIUM 9.0  ? ? ?Intake/Output Summary (Last 24 hours) at 01/17/2022 1943 ?Last data filed at 01/17/2022 1821 ?Gross per 24 hour  ?Intake 236 ml  ?Output 1875 ml  ?Net -1639 ml  ?  ? ?Pressure Injury 01/06/22 Sacrum Deep Tissue Pressure Injury - Purple or maroon localized area of discolored intact skin or blood-filled blister due to damage of underlying soft tissue from pressure and/or shear. (Active)  ?01/06/22 1603  ?Location: Sacrum  ?Location Orientation:   ?Staging: Deep Tissue Pressure Injury - Purple or maroon localized area of discolored intact skin or blood-filled blister due to damage of underlying soft tissue from pressure and/or shear.  ?Wound Description (Comments):   ?Present on Admission: Yes  ? ? ?Physical Exam: ?Vital Signs ?Blood pressure (!) 143/66, pulse 64, temperature 98.7 ?F (37.1 ?C), temperature source Oral, resp. rate 18, height 5' (1.524 m), weight 64.7 kg, SpO2 100 %. ? ? ?General: awake, alert, appropriate, on 3L O2 by [place]; home dose; supine in bed; NAD ?HENT: conjugate gaze; oropharynx moist ?CV: regular rate; no JVD ?Pulmonary: CTA B/L; no W/R/R- good air movement ?GI: soft, NT, ND, (+)BS- midline inicsion- colostomy looks full ?Psychiatric: appropriate;  ?Neurological: alert ?Skin: ?  General: Skin is warm and dry.  ?   Comments: Midline incision and  left lower extremity incisions helaing without signs of infection  ?Neurological:  ?   Mental Status: She is alert and oriented to person, place, and time.  ?   Motor: Weakness present.  ?   Comments: Weak dorsiflexion of right foot  ? ?Assessment/Plan: ?1. Functional deficits which require 3+ hours per day of interdisciplinary therapy in a comprehensive inpatient rehab setting. ?Physiatrist is providing close team supervision and 24 hour management of active medical problems listed below. ?Physiatrist and rehab team continue to assess barriers to discharge/monitor patient progress toward functional and medical goals ? ?Care Tool: ? ?Bathing ?   ?Body parts bathed by patient: Right arm, Left arm, Chest, Abdomen, Front perineal area, Buttocks, Face, Right upper leg, Left upper leg  ? Body parts bathed by helper: Right lower leg, Left lower leg ?  ?  ?Bathing assist Assist Level: Minimal Assistance - Patient > 75% ?  ?  ?Upper Body Dressing/Undressing ?Upper body dressing   ?What is the patient wearing?: Dress ?   ?Upper body assist Assist Level: Supervision/Verbal cueing ?   ?Lower Body Dressing/Undressing ?Lower body dressing ? ? ?   ?  What is the patient wearing?: Incontinence brief ? ?  ? ?Lower body assist Assist for lower body dressing: Minimal Assistance - Patient > 75% ?   ? ?Toileting ?Toileting    ?Toileting assist Assist for toileting: Moderate Assistance - Patient 50 - 74% ?  ?  ?Transfers ?Chair/bed transfer ? ?Transfers assist ?   ? ?Chair/bed transfer assist level: Contact Guard/Touching assist ?  ?  ?Locomotion ?Ambulation ? ? ?Ambulation assist ? ? Ambulation activity did not occur: Safety/medical concerns ? ?  ?  ?   ? ?Walk 10 feet activity ? ? ?Assist ? Walk 10 feet activity did not occur: Safety/medical concerns ? ?  ?   ? ?Walk 50 feet activity ? ? ?Assist Walk 50 feet with 2 turns activity did not occur: Safety/medical concerns ? ?  ?   ? ? ?Walk 150 feet activity ? ? ?Assist Walk 150 feet  activity did not occur: Safety/medical concerns ? ?  ?  ?  ? ?Walk 10 feet on uneven surface  ?activity ? ? ?Assist Walk 10 feet on uneven surfaces activity did not occur: Safety/medical concerns ? ? ?  ?   ? ?Wheelchair ? ? ? ? ?Assist Is the patient using a wheelchair?: Yes ?Type of Wheelchair: Manual ?  ? ?Wheelchair assist level: Dependent - Patient 0% (limited by pain) ?   ? ? ?Wheelchair 50 feet with 2 turns activity ? ? ? ?Assist ? ?  ?  ? ? ?Assist Level: Dependent - Patient 0%  ? ?Wheelchair 150 feet activity  ? ? ? ?Assist ?   ? ? ?Assist Level: Dependent - Patient 0%  ? ?Blood pressure (!) 143/66, pulse 64, temperature 98.7 ?F (37.1 ?C), temperature source Oral, resp. rate 18, height 5' (1.524 m), weight 64.7 kg, SpO2 100 %. ? ?Medical Problem List and Plan: ?1. Functional deficits secondary to perforated diverticulum, exploratory laparotomy with colostomy, recent tibial plateau fracture repair. ?            -patient may shower but colostomy must be covered ?            -ELOS/Goals: MinA/S 12-16 days     ?            Continue CIR- PT, OT - daughter has been trained on colostomy- not pt-  ?2.  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            -antiplatelet therapy: none ?3. Pain: continue Tylenol, oxycodone PRN ?            --Robaxin 500 mg q 6 hours ? 3/31- has chronic back pain, but controlled overall- con't regimen ?4. Mood: LCSW to evaluate and provide emotional support ?            -continue Wellbutrin XL 300 mg q AM ?            -continue Buspar 5 mg BID ?            --continue Xanax 0.5 mg prn anxiety ?            -antipsychotic agents: n/a ?5. Neuropsych: This patient is capable of making decisions on her own behalf. ?6. Skin/Wound Care: Routine skin care checks ?            -- Monitor midline incision, LLE incisions ?            -- Routine stoma care ?            --  Monitor RUE bullous lesions ? 3/31- staples not  ready to come out- surgery was 3/21- likely d/c next week  ?7. 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 ?10: COPD: continue chronic prednisone 20 mg daily ?            -continue Breo Ellipta + Incruse Ellipta ?            --albuterol nebs prn ? 3/30- on O2 3L -home O2- con't ?11: OSA: continue CPAP nightly; ? O2 bleed in ?12: Recent repair of left tibial plateau fracture with bone biopsies ?            --s/p ORIF by Dr Doreatha Martin 12/13/2021 ?            --weight bearing with transfers only; unrestricted ROM ?            --bone culture +MRSA ?            --continue daptomycin through 4/26 ?            --follow-up with ID ? 3/30- WBC down to 9.4 from 13k- will con't to monitor and recheck ESR and CRP weekly.  ?13: Hypertension: continue Toprol XL 50 mg daily ?Vitals:  ? 01/17/22 1357 01/17/22 1917  ?BP: (!) 155/68 (!) 143/66  ?Pulse: 61 64  ?Resp:  18  ?Temp:  98.7 ?F (37.1 ?C)  ?SpO2:  100%  ?4/3- BP looking better- con't cozaar-  ?14: CKD stage IIIb: normal serum creatinine at 0.66 ? ?  Latest Ref Rng & Units 01/17/2022  ?  5:14 AM 01/13/2022  ?  5:07 AM 01/12/2022  ?  3:45 AM  ?BMP  ?Glucose 70 - 99 mg/dL 97   86   110    ?BUN 8 - 23 mg/dL _0 ?Creatinine 0.44 - 1.00 mg/dL 0.65   0.82   0.66    ?Sodium 135 - 145 mmol/L 137   142   139    ?Potassium 3.5 - 5.1 mmol/L 4.0   3.7   3.8    ?Chloride 98 - 111 mmol/L 100   104   101    ?CO2 22 - 32 mmol/L _1 ?Calcium 8.9 - 10.3 mg/dL 9.0   8.8   8.6    ? ?Renal fx stable should be able to resume cozaar  ?15: Hypothyroidism: continue Synthroid ?16: ? Cognitive decline: on Aricept ?17: Hyperlipidemia: continue Lipitor ?18: Insomnia/poor sleep hygiene: continue Remeron 30 mg q HS ? 3/31- sleeping stable- con't regimen ?19: Right foot weakness: PRAFO boot ?20: Glossitis, possible thrush: she is requesting Nystatin s/s ?21.  Dysuria/Yeast UTI- and urgency possible UTI ? 3/30- will check U/A and Cx- checked 3/27- but could be positive now. Will allow Purewick at night for now.  ? 3/31- is yeast UTI- will give Diflucan 200 mg x1 and 100 mg daily

## 2022-01-17 NOTE — Progress Notes (Signed)
Physical Therapy Session Note ? ?Patient Details  ?Name: Holly [name] ?MRN: [id number] ?Date of Birth: 10-15-1946 ? ?Today's Date: 01/17/2022 ?PT Individual Time: 9381-0175 ?PT Individual Time Calculation (min): 20 min  and Today's Date: 01/17/2022 ?PT Missed Time: 53 Minutes ?Missed Time Reason: Patient ill (Comment);Patient fatigue ? ?Short Term Goals: ?Week 1:  PT Short Term Goal 1 (Week 1): Patient will perform basic transfers with CGA. ?PT Short Term Goal 2 (Week 1): Patient will propel w/c >50 ft with supervision. ? ?Skilled Therapeutic Interventions/Progress Updates:  ?  Patient received supine in bed with CPAP on, reporting fatigue and nausea. She states that she doesn't feel up to participating in PT, but requests help with toileting and repositioning in bed. PT doffing CPAP mask. Patient requesting to use urinal in bed. Continent void in urinal. Patient also requesting to replace brief that was on. Patient rolling in bed using bedrails and supervision while PT placed brief. She was able to reposition in bed with verbal cues and bridging. Patient reporting increase in nausea with minimal movement. PT alerting RN to reports of nausea and placement of brief. RN stating that an open brief until [place] arrives on floor is ok. Patient remaining in bed, 4 rails up, needs within reach.  ? ?Therapy Documentation ?Precautions:  ?Precautions ?Precautions: Fall ?Precaution Comments: colostomy; rt quadrant drain;  recent left tibia fx ?Restrictions ?Weight Bearing Restrictions: Yes ?LLE Weight Bearing: Weight bearing as tolerated ?Other Position/Activity Restrictions: WBAT for transfers only ? ? ? ?Therapy/Group: Individual Therapy ? ?Debbora Dus ?Debbora Dus, PT, DPT, CBIS ? ?01/17/2022, 7:38 AM  ?

## 2022-01-17 NOTE — Progress Notes (Signed)
Occupational Therapy Session Note ? ?Patient Details  ?Name: Holly [name] ?MRN: [id number] ?Date of Birth: 02-15-1946 ? ?Today's Date: 01/17/2022 ?OT Individual Time: 1110-1200 ?OT Individual Time Calculation (min): 50 min (missed 10 min due to fatigue) ? ? ?Short Term Goals: ?Week 1:  OT Short Term Goal 1 (Week 1): The pt will complete UB/LB bathing with with MinA using AE of a long hand sponge as needed ?OT Short Term Goal 2 (Week 1): The pt will complete UB/LB dressing with MinA using AE of a reacher for LE dressing as needed. ?OT Short Term Goal 3 (Week 1): The pt will present with UB strength of 3+/5 MMT for safe and ind task persormance ?OT Short Term Goal 4 (Week 1): The pt will complete toilet transfers with ModI for safe and independent function fot transitioning to home. ?OT Short Term Goal 5 (Week 1): The pt will complete sit to stand with S incorporating RW for ind and safety during functional task completeion. ? ?Skilled Therapeutic Interventions/Progress Updates:  ?  Pt received in bed and pt agreeable to therapy.  Pt was able to get out to EOB with supervision, stood to RW with Supervision with cues to push up from bed vs the RW.  Pt completed stand pivot to wc with CGA.  From wc she completed b/d with close S to min A.  ?Pt able to stand at sink for 3 minutes to complete oral care.  Pt getting very fatigued stating she needed to get back to bed. Session time started at 1110 and I had planned to work with her until 1210, but pt missed the last 10 min due to fatigue.  ?She transferred to EOB and then needed A to get B legs into bed.  Pt's brief removed.  RN informed pt back in bed and requesting purewick.  ? ?Therapy Documentation ?Precautions:  ?Precautions ?Precautions: Fall ?Precaution Comments: colostomy; rt quadrant drain;  recent left tibia fx ?Restrictions ?Weight Bearing Restrictions: Yes ?LLE Weight Bearing: Weight bearing as tolerated ?Other Position/Activity Restrictions: WBAT for transfers  only ? ?  ?Vital Signs: ?Therapy Vitals ?Temp: 98.5 ?F (36.9 ?C) ?Temp Source: Oral ?Pulse Rate: 66 ?Resp: 18 ?BP: (!) 164/65 ?Patient Position (if appropriate): Lying ?Oxygen Therapy ?SpO2: 100 % ?O2 Device: Nasal Cannula ?O2 Flow Rate (L/min): 3 L/min ?Pain: ?Pain Assessment ?Pain Scale: 0-10 ?Pain Score: 6  ?Faces Pain Scale: Hurts a little bit ?Pain Type: Acute pain ?Pain Location: Abdomen ?Pain Orientation: Left ?Pain Intervention(s): Medication (See eMAR) (scheduled medication) ?PAINAD (Pain Assessment in Advanced Dementia) ?Breathing: normal ?Negative Vocalization: none ?Body Language: relaxed ?Consolability: no need to console ?ADL: ?ADL ?Eating: Set up, Not assessed (based on functional task completion.) ?Where Assessed-Eating:  (based on functional task completion.) ?Grooming: Setup ?Where Assessed-Grooming: Edge of bed ?Upper Body Bathing: Minimal cueing ?Where Assessed-Upper Body Bathing: Edge of bed ?Lower Body Bathing: Maximal assistance ?Where Assessed-Lower Body Bathing: Edge of bed ?Upper Body Dressing: Minimal cueing ?Where Assessed-Upper Body Dressing: Edge of bed ?Lower Body Dressing: Maximal assistance ?Where Assessed-Lower Body Dressing: Edge of bed ?Toileting: Minimal assistance, Moderate assistance (Min/ModA with additional time.) ?Where Assessed-Toileting: Other (Comment) (based on functionat task completion) ?Toilet Transfer: Minimal assistance ?Toilet Transfer Method: Stand pivot ?Science writer: Grab bars, Raised toilet seat ?Tub/Shower Transfer: Minimal assistance, Minimal cueing ?Tub/Shower Transfer Method:  (Additional assessing needed) ?Walk-In Shower Transfer:  (more assessing needed) ?Walk-In Shower Transfer Method: Stand pivot (patient demonstrates the ability to complete stand pivot based on her performance at  EOB) ?Youth worker:  (Patient has a walk in shower with a shower chair to assist with performance.) ? ?Therapy/Group: Individual  Therapy ? ?[place] ?01/17/2022, 9:49 AM ?

## 2022-01-18 ENCOUNTER — Inpatient Hospital Stay: Payer: Self-pay

## 2022-01-18 DIAGNOSIS — R5381 Other malaise: Secondary | ICD-10-CM | POA: Diagnosis not present

## 2022-01-18 MED ORDER — LOSARTAN POTASSIUM 25 MG PO TABS
25.0000 mg | ORAL_TABLET | Freq: Every day | ORAL | Status: DC
  Administered 2022-01-18 – 2022-01-27 (×9): 25 mg via ORAL
  Filled 2022-01-18 (×10): qty 1

## 2022-01-18 NOTE — Progress Notes (Signed)
Pt already placed herself on CPAP when are entered room. RT will monitor ?

## 2022-01-18 NOTE — Plan of Care (Signed)
?  Problem: RH Balance ?Goal: LTG Patient will maintain dynamic standing balance (PT) ?Description: LTG:  Patient will maintain dynamic standing balance with assistance during mobility activities (PT) ?Flowsheets (Taken 01/18/2022 1144) ?LTG: Pt will maintain dynamic standing balance during mobility activities with:: (downgraded due to patient baseline) Supervision/Verbal cueing ?  ?Problem: Sit to Stand ?Goal: LTG:  Patient will perform sit to stand with assistance level (PT) ?Description: LTG:  Patient will perform sit to stand with assistance level (PT) ?Flowsheets (Taken 01/18/2022 1144) ?LTG: PT will perform sit to stand in preparation for functional mobility with assistance level: (downgraded due to patient baseline) Supervision/Verbal cueing ?  ?Problem: RH Bed to Chair Transfers ?Goal: LTG Patient will perform bed/chair transfers w/assist (PT) ?Description: LTG: Patient will perform bed to chair transfers with assistance (PT). ?Flowsheets (Taken 01/18/2022 1144) ?LTG: Pt will perform Bed to Chair Transfers with assistance level: (downgraded due to patient baseline) Supervision/Verbal cueing ?  ?Problem: RH Car Transfers ?Goal: LTG Patient will perform car transfers with assist (PT) ?Description: LTG: Patient will perform car transfers with assistance (PT). ?Flowsheets (Taken 01/18/2022 1144) ?LTG: Pt will perform car transfers with assist:: (downgraded due to patient baseline) Contact Guard/Touching assist ?  ?Problem: RH Furniture Transfers ?Goal: LTG Patient will perform furniture transfers w/assist (OT/PT) ?Description: LTG: Patient will perform furniture transfers  with assistance (OT/PT). ?Flowsheets (Taken 01/18/2022 1144) ?LTG: Pt will perform furniture transfers with assist:: (downgraded due to patient baseline) Supervision/Verbal cueing ?  ?

## 2022-01-18 NOTE — Progress Notes (Addendum)
? ?Subjective/Chief Complaint: ?Asked to see patient as she had a BM out of her rectum yesterday and her colostomy doesn't have any output today yet.  She states she is continuing to eat well and her abdomen is soft and nontender.  She has no complaints otherwise regarding her abdomen. ? ? ?Objective: ?Vital signs in last 24 hours: ?Temp:  [98.4 ?F (36.9 ?C)-98.7 ?F (37.1 ?C)] 98.5 ?F (36.9 ?C) (04/04 2979) ?Pulse Rate:  [61-72] 72 (04/04 0816) ?Resp:  [16-18] 16 (04/04 0816) ?BP: (143-180)/(64-69) 174/64 (04/04 8921) ?SpO2:  [97 %-100 %] 98 % (04/04 0816) ?Last BM Date : 01/18/22 ? ?Intake/Output from previous day: ?04/03 0701 - 04/04 0700 ?In: 19 [P.O.:236] ?Out: 1375 [[id number]] ?Intake/Output this shift: ?No intake/output data recorded. ? ?Gen:  Alert, NAD, pleasant ?Abd: soft, ND, NT, midline c/d/I. colostomy viable with no output currently.   ?Rectum: no erythema or infection noted.  No hemorrhoids or other abnormalities. ? ?Lab Results:  ?Recent Labs  ?  01/17/22 ?4481  ?WBC 12.3*  ?HGB 10.4*  ?HCT 35.9*  ?PLT 266  ? ?BMET ?Recent Labs  ?  01/17/22 ?8563  ?NA 137  ?K 4.0  ?CL 100  ?CO2 30  ?GLUCOSE 97  ?BUN 10  ?CREATININE 0.65  ?CALCIUM 9.0  ? ?PT/INR ?No results for input(s): LABPROT, INR in the last 72 hours. ?ABG ?No results for input(s): PHART, HCO3 in the last 72 hours. ? ?Invalid input(s): PCO2, PO2 ? ?Studies/Results: ?No results found. ? ?Anti-infectives: ?Anti-infectives (From admission, onward)  ? ? Start     Dose/Rate Route Frequency Ordered Stop  ? 01/15/22 0800  fluconazole (DIFLUCAN) tablet 100 mg       ? 100 mg Oral Daily 01/14/22 0814 01/21/22 0759  ? 01/14/22 0900  fluconazole (DIFLUCAN) tablet 200 mg       ? 200 mg Oral  Once 01/14/22 0814 01/14/22 0938  ? 01/12/22 2000  DAPTOmycin (CUBICIN) 500 mg in sodium chloride 0.9 % IVPB       ? 8 mg/kg ? 62.1 kg ?120 mL/hr over 30 Minutes Intravenous Daily 01/12/22 1717 02/09/22 2359  ? ?  ? ? 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 Thermon Leyland for complicated diverticulitis with multiple abscesses not amenable to IR drainage who failed antibiotic treatment  ?- surgical path benign - perforated diverticulitis ?- Soft diet. ?- expected for patient to have some BM from residual stool and eventually mucous type BMs over time from rectum as the mucosa is still viable and produces mucus.  This would not be abnormal necessarily ?-her colostomy hasn't had output today.  She did have output yesterday.  Over time the bowel function will return to more normal functionality at times with a BM once a day and no other output til the next BM just as someone with normal plumbing.   ?- do not suspect an acute abnormalities at this time as she is still eating, no distention, AF, stable WBC, but will see again tomorrow and assess colostomy again at that time. ?-no need for CT scan or other imaging at this time. ?-do NOT give enema through rectum.  She has a rectal stump and this pressure may blow it out. ?  ?FEN - soft, Boost ?VTE - Eliquis ?ID - cubicin, for infected hardware ?  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 LOS: 6 days  ? ? ?Holly [name] ?01/18/2022 ? ?

## 2022-01-18 NOTE — Progress Notes (Signed)
Patient ID: Holly [name], female   DOB: June 05, 1946, 76 y.o.   MRN: [id number]  met with pt and spoke with daughter-margie via telephone to update regarding team conference goals of supervision level and target discharge date of 4/10. Will need PICC line for home IV antibiotics. Daughter is aware of this. Have scheduled family education with daughter for Sat 10-12. Carolynn Sayers aware of discharge date. Have updated [facility] also to resume therapies. Pt pleased with her progress and feels will be ready on Monday. ?

## 2022-01-18 NOTE — Plan of Care (Signed)
?  Problem: RH Bathing ?Goal: LTG Patient will bathe all body parts with assist levels (OT) ?Description: LTG: Patient will bathe all body parts with assist levels (OT) ?Flowsheets (Taken 01/18/2022 0920) ?LTG: Pt will perform bathing with assistance level/cueing: (LTG downgraded due to pt's low endurance which is chronic from COPD, lung CA.) Supervision/Verbal cueing ?Note: LTG downgraded due to pt's low endurance which is chronic from COPD, lung CA. ?  ?Problem: RH Dressing ?Goal: LTG Patient will perform upper body dressing (OT) ?Description: LTG Patient will perform upper body dressing with assist, with/without cues (OT). ?Flowsheets (Taken 01/18/2022 0920) ?LTG: Pt will perform upper body dressing with assistance level of: (LTG downgraded due to pt's low endurance which is chronic from COPD, lung CA.) Set up assist ?Note: LTG downgraded due to pt's low endurance which is chronic from COPD, lung CA. ?Goal: LTG Patient will perform lower body dressing w/assist (OT) ?Description: LTG: Patient will perform lower body dressing with assist, with/without cues in positioning using equipment (OT) ?Flowsheets (Taken 01/18/2022 0920) ?LTG: Pt will perform lower body dressing with assistance level of: (LTG downgraded due to pt's low endurance which is chronic from COPD, lung CA.) Supervision/Verbal cueing ?Note: LTG downgraded due to pt's low endurance which is chronic from COPD, lung CA. ?  ?Problem: RH Toilet Transfers ?Goal: LTG Patient will perform toilet transfers w/assist (OT) ?Description: LTG: Patient will perform toilet transfers with assist, with/without cues using equipment (OT) ?Flowsheets (Taken 01/18/2022 [phone]) ?LTG: Pt will perform toilet transfers with assistance level of: (Pt will transfer from chair to [facility] stand pivot with RW with mod I.) Independent with assistive device ?Note: Pt will transfer from chair to [facility] stand pivot with RW with mod I. ?  ?

## 2022-01-18 NOTE — Progress Notes (Signed)
Patient placed self on CPAP.  RT will continue to monitor.  ?

## 2022-01-18 NOTE — Progress Notes (Signed)
Physical Therapy Session Note ? ?Patient Details  ?Name: Holly [name] ?MRN: [id number] ?Date of Birth: 04/23/1946 ? ?Today's Date: 01/18/2022 ?PT Individual Time: 3845-3646 ?PT Individual Time Calculation (min): 55 min  ? ?Short Term Goals: ?Week 1:  PT Short Term Goal 1 (Week 1): Patient will perform basic transfers with CGA. ?PT Short Term Goal 2 (Week 1): Patient will propel w/c >50 ft with supervision. ? ?Skilled Therapeutic Interventions/Progress Updates:  ?  Pt presents in bed agreeable to session but hesitant due to issues with residual bowel. With brief donned and purewick still attached, agreeable to work in room to tolerance. D/c planning discussed (reports she is able to leave 4/10) and overall states she feels prepared and eager for home. Pt asking about w/c and followed up with primary PT as well. She will  have support available upon d/c as well and has no concerns.  ? ?Pt performed supine to sit with HOB elevated and use of bedrail with supervision. Supervision transfer to w/c with RW stand step with good safety awareness noted. Seated therex for functional UE strengthening and endurance with 3# straight weight including bicep curls pronated and supinated hand positions, overhead chest press, and forward chest press x 10 reps each x 2 sets with rest breaks as needed. Blocked practice sit <> stands for functional strengthening and WB tolerance with supervision x 5 reps (required longer rest break after 3 completed) and cues for breathing technique as increased exertion.  ? ?With time to rest, performed supervision transfer back to bed with RW and required assist with LE management into the bed due to abdominal discomfort. Discussed at length ways to continue to work on strengthening and endurance prior to WB status being updated in specific to RLE (noted decreased R DF and hip flexion) and exercises she can do in the bed. Educated on how this can affect gait and options if strength did not fully return  and pt having difficulty with clearance of foot. Pt with great questions about overall progression.  ? ?Pt does request HEP before d/c home and will pass along to primary PT/OT as well.  ? ? ?Therapy Documentation ?Precautions:  ?Precautions ?Precautions: Fall ?Precaution Comments: colostomy; rt quadrant drain;  recent left tibia fx ?Restrictions ?Weight Bearing Restrictions: Yes ?LLE Weight Bearing: Weight bearing as tolerated ?Other Position/Activity Restrictions: WBAT for transfers only ? ?  ?Vital Signs: ?3L O2 via [facility] ?Pain: ? Reports abdominal pain with movement. No interventions needed at this time. ? ? ? ? ?Therapy/Group: Individual Therapy ? ?Allayne Gitelman ?Lars Masson, PT, DPT, CBIS ? ?01/18/2022, 3:08 PM  ?

## 2022-01-18 NOTE — Consult Note (Signed)
[facility] Nurse ostomy follow up ?Patient receiving care in [place]. ?The PATIENT requested more education for performing her own ostomy care.  I have entered the following nursing care instruction order: Have the PATIENT empty her ostomy pouch EVERY time it needs emptying.  If she needs instruction on how to do this, provide it, but have HER perform the emptying. ? ?And, I spoke with her nurse on the phone.   ?I have reserved a one hour block of time for this Thursday from 9-10 for in depth ostomy teaching. ? ?Val Riles, RN, MSN, CWOCN, CNS-BC, pager [phone]  ?

## 2022-01-18 NOTE — Progress Notes (Signed)
?                                                       PROGRESS NOTE ? ? ?Subjective/Complaints: ? ?Pt reports had a BM via rectum overnight- however colostomy hasn't really put anything out since last night.  ?Pt also asking for colostomy training.  ? ? ? ? ?ROS: ? ?Pt denies SOB, abd pain, CP, N/V/C/D, and vision changes ? ?Objective: ?  ?No results found. ?Recent Labs  ?  01/17/22 ?9476  ?WBC 12.3*  ?HGB 10.4*  ?HCT 35.9*  ?PLT 266  ? ?Recent Labs  ?  01/17/22 ?5465  ?NA 137  ?K 4.0  ?CL 100  ?CO2 30  ?GLUCOSE 97  ?BUN 10  ?CREATININE 0.65  ?CALCIUM 9.0  ? ? ?Intake/Output Summary (Last 24 hours) at 01/18/2022 0926 ?Last data filed at 01/17/2022 2040 ?Gross per 24 hour  ?Intake 236 ml  ?Output 1375 ml  ?Net -1139 ml  ?  ? ?Pressure Injury 01/06/22 Sacrum Deep Tissue Pressure Injury - Purple or maroon localized area of discolored intact skin or blood-filled blister due to damage of underlying soft tissue from pressure and/or shear. (Active)  ?01/06/22 1603  ?Location: Sacrum  ?Location Orientation:   ?Staging: Deep Tissue Pressure Injury - Purple or maroon localized area of discolored intact skin or blood-filled blister due to damage of underlying soft tissue from pressure and/or shear.  ?Wound Description (Comments):   ?Present on Admission: Yes  ? ? ?Physical Exam: ?Vital Signs ?Blood pressure (!) 174/64, pulse 72, temperature 98.5 ?F (36.9 ?C), temperature source Oral, resp. rate 16, height 5' (1.524 m), weight 64.7 kg, SpO2 98 %. ? ? ? ?General: awake, alert, appropriate, supine in bed;  NAD ?HENT: conjugate gaze; oropharynx moist- wearing home O2 by [facility] 3L ?CV: regular rate; no JVD ?Pulmonary: CTA B/L; no W/R/R- decreased at bases B/L- chronic ?GI: soft, NT, ND, hypoactive BS; colostomy has very small amount of stool in bag- incision C/D/I ?Psychiatric: appropriate ?Neurological: Ox3 ?Skin: ?  General: Skin is warm and dry.  ?   Comments: Midline incision and left lower extremity incisions helaing without signs  of infection  ?Neurological:  ?   Mental Status: She is alert and oriented to person, place, and time.  ?   Motor: Weakness present.  ?   Comments: Weak dorsiflexion of right foot  ? ?Assessment/Plan: ?1. Functional deficits which require 3+ hours per day of interdisciplinary therapy in a comprehensive inpatient rehab setting. ?Physiatrist is providing close team supervision and 24 hour management of active medical problems listed below. ?Physiatrist and rehab team continue to assess barriers to discharge/monitor patient progress toward functional and medical goals ? ?Care Tool: ? ?Bathing ?   ?Body parts bathed by patient: Right arm, Left arm, Chest, Abdomen, Front perineal area, Buttocks, Face, Right upper leg, Left upper leg  ? Body parts bathed by helper: Right lower leg, Left lower leg ?  ?  ?Bathing assist Assist Level: Minimal Assistance - Patient > 75% ?  ?  ?Upper Body Dressing/Undressing ?Upper body dressing   ?What is the patient wearing?: Dress ?   ?Upper body assist Assist Level: Supervision/Verbal cueing ?   ?Lower Body Dressing/Undressing ?Lower body dressing ? ? ?   ?What is the patient wearing?: Incontinence brief ? ?  ? ?  Lower body assist Assist for lower body dressing: Minimal Assistance - Patient > 75% ?   ? ?Toileting ?Toileting    ?Toileting assist Assist for toileting: Moderate Assistance - Patient 50 - 74% ?  ?  ?Transfers ?Chair/bed transfer ? ?Transfers assist ?   ? ?Chair/bed transfer assist level: Contact Guard/Touching assist ?  ?  ?Locomotion ?Ambulation ? ? ?Ambulation assist ? ? Ambulation activity did not occur: Safety/medical concerns ? ?  ?  ?   ? ?Walk 10 feet activity ? ? ?Assist ? Walk 10 feet activity did not occur: Safety/medical concerns ? ?  ?   ? ?Walk 50 feet activity ? ? ?Assist Walk 50 feet with 2 turns activity did not occur: Safety/medical concerns ? ?  ?   ? ? ?Walk 150 feet activity ? ? ?Assist Walk 150 feet activity did not occur: Safety/medical concerns ? ?  ?  ?   ? ?Walk 10 feet on uneven surface  ?activity ? ? ?Assist Walk 10 feet on uneven surfaces activity did not occur: Safety/medical concerns ? ? ?  ?   ? ?Wheelchair ? ? ? ? ?Assist Is the patient using a wheelchair?: Yes ?Type of Wheelchair: Manual ?  ? ?Wheelchair assist level: Dependent - Patient 0% (limited by pain) ?   ? ? ?Wheelchair 50 feet with 2 turns activity ? ? ? ?Assist ? ?  ?  ? ? ?Assist Level: Dependent - Patient 0%  ? ?Wheelchair 150 feet activity  ? ? ? ?Assist ?   ? ? ?Assist Level: Dependent - Patient 0%  ? ?Blood pressure (!) 174/64, pulse 72, temperature 98.5 ?F (36.9 ?C), temperature source Oral, resp. rate 16, height 5' (1.524 m), weight 64.7 kg, SpO2 98 %. ? ?Medical Problem List and Plan: ?1. Functional deficits secondary to perforated diverticulum, exploratory laparotomy with colostomy, recent tibial plateau fracture repair. ?            -patient may shower but colostomy must be covered ?            -ELOS/Goals: MinA/S 12-16 days     ?            Continue CIR- PT, OT - team conference today to determine length of stay ?Daughter trained on colostomy- will have nursing call [place]- to see if pt can also be trained.   ?2.  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            -antiplatelet therapy: none ?3. Pain: continue Tylenol, oxycodone PRN ?            --Robaxin 500 mg q 6 hours ? 4/4- pain controlled- con't regimen ?4. Mood: LCSW to evaluate and provide emotional support ?            -continue Wellbutrin XL 300 mg q AM ?            -continue Buspar 5 mg BID ?            --continue Xanax 0.5 mg prn anxiety ?            -antipsychotic agents: n/a ?5. Neuropsych: This patient is capable of making decisions on her own behalf. ?6. Skin/Wound Care: Routine skin care checks ?            -- Monitor midline incision, LLE incisions ?            -- Routine stoma care ?            --  Monitor RUE bullous lesions ? 3/31- staples  not ready to come out- surgery was 3/21- likely d/c next week  ? 4/4- will find out from surgeon when can remove staples- is go by days, they're due to be removed.  ?7. 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 ?10: COPD: continue chronic prednisone 20 mg daily ?            -continue Breo Ellipta + Incruse Ellipta ?            --albuterol nebs prn ? 3/30- on O2 3L -home O2- con't ?11: OSA: continue CPAP nightly; ? O2 bleed in ?12: Recent repair of left tibial plateau fracture with bone biopsies ?            --s/p ORIF by Dr Doreatha Martin 12/13/2021 ?            --weight bearing with transfers only; unrestricted ROM ?            --bone culture +MRSA ?            --continue daptomycin through 4/26 ?            --follow-up with ID ? 3/30- WBC down to 9.4 from 13k- will con't to monitor and recheck ESR and CRP weekly.  ?13: Hypertension: continue Toprol XL 50 mg daily ?Vitals:  ? 01/18/22 0619 01/18/22 0816  ?BP: (!) 174/64   ?Pulse: 68 72  ?Resp: 18 16  ?Temp: 98.5 ?F (36.9 ?C)   ?SpO2: 97% 98%  ? 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 ?  Latest Ref Rng & Units 01/17/2022  ?  5:14 AM 01/13/2022  ?  5:07 AM 01/12/2022  ?  3:45 AM  ?BMP  ?Glucose 70 - 99 mg/dL 97   86   110    ?BUN 8 - 23 mg/dL _0 ?Creatinine 0.44 - 1.00 mg/dL 0.65   0.82   0.66    ?Sodium 135 - 145 mmol/L 137   142   139    ?Potassium 3.5 - 5.1 mmol/L 4.0   3.7   3.8    ?Chloride 98 - 111 mmol/L 100   104   101    ?CO2 22 - 32 mmol/L _1 ?Calcium 8.9 - 10.3 mg/dL 9.0   8.8   8.6    ? ?Renal fx stable should be able to resume cozaar  ?15: Hypothyroidism: continue Synthroid ?16: ? Cognitive decline: on Aricept ?17: Hyperlipidemia: continue Lipitor ?18: Insomnia/poor sleep hygiene: continue Remeron 30 mg q HS ? 3/31- sleeping stable- con't  regimen ?19: Right foot weakness: PRAFO boot ?20: Glossitis, possible thrush: she is requesting Nystatin s/s ?21. Dysuria/Yeast UTI- and urgency possible UTI ? 3/30- will check U/A and Cx- checked 3/27-

## 2022-01-18 NOTE — Patient Care Conference (Signed)
Inpatient RehabilitationTeam Conference and Plan of Care Update ?Date: 01/18/2022   Time: 12:05 PM  ? ? ?Patient Name: Holly [name]      ?Medical Record Number: [id number]  ?Date of Birth: 1945-12-18 ?Sex: Female         ?Room/Bed: 5C04C/5C04C-01 ?Payor Info: Payor: MEDICARE / Plan: MEDICARE PART A AND B / Product Type: *No Product type* /   ? ?Admit Date/Time:  01/12/2022  5:13 PM ? ?Primary Diagnosis:  Debility ? ?Hospital Problems: Principal Problem: ?  Debility ? ? ? ?Expected Discharge Date: Expected Discharge Date: 01/24/22 ? ?Team Members Present: ?Physician leading conference: Dr. Courtney Heys ?Social Worker Present: Ovidio Kin, LCSW ?Nurse Present: Dorien Chihuahua, RN ?PT Present: Estevan Ryder, PT ?OT Present: Meriel Pica, OT ?SLP Present: Other (comment) Gunnar Fusi, SLP) ?PPS Coordinator present : Gunnar Fusi, SLP ? ?   Current Status/Progress Goal Weekly Team Focus  ?Bowel/Bladder ? ? purewick in place for wound healing. Left side ostomy bag.  Pt to empty and change ostomy on own.  provide ostomy education.   ?Swallow/Nutrition/ Hydration ? ?           ?ADL's ? ? min A with donning socks and briefs, set up with UB self care, CGA with BSC transfers  Mod I with toilet transfers and transfers, supervision bathing and dressing.  Adl training, functional mobility, pt education   ?Mobility ? ? MinA supine <> sit, spv rolling using bed rails, CGA transfers using RW, spv wc mob  ModI, may have to downgrade to spv  participation, endurance, transfers, bed mob, wc mob, pt/fam ed   ?Communication ? ?           ?Safety/Cognition/ Behavioral Observations ?           ?Pain ? ? Intermittent abdominal pain. PRN medications available and effective.  pain </=3/10  Assess Qshift and prn   ?Skin ? ? unstageable deep tissue injury on sacrum, JP removal site on RLQ, Mid abdomen incison closed with staples, LLQ stoma.  Promote wound healing and prevention of infection.  Continue wound care as ordered.   ? ? ?Discharge  Planning:  ?Plan to go home with daughter who does work and son can assist while daughter working. Daughter has been checekd off on ostomy care, pt needs education on this   ?Team Discussion: ?Patient with residual bowel movement from rectum and no bowel movement from ostomy. GI consulted and noted WNL; reviewed with the patient. Ongoing ostomy care education with the patient; daughter checked off on education. Patient with chronic COPD, O2 @ 3L/Min [place] with progress limited by poor endurance and pain. MD adjusting BP meds and monitoring WBC. Continues on IV abx. ?Patient on target to meet rehab goals: ?no, currently mod I for stand pivot to the [facility] with supervision to [place] for transfers overall. Able to get edge of bed with supervision however needs assist to get legs back into the bed. Also limited by WB restrictions (5 wks post op). Goals for discharge set for supervision.  ? ?*See Care Plan and progress notes for long and short-term goals.  ? ?Revisions to Treatment Plan:  ?Downgraded goals for OT/PT  ?Teaching Needs: ?Safety, transfers, toileting, skin care/wound care, medications, etc  ?Current Barriers to Discharge: ?Decreased caregiver support, Home enviroment access/layout, IV antibiotics, Wound care, and new colostomy ? ?Possible Resolutions to Barriers: ?Family education; skin care, wound care, ostomy care, IV abx, etc ?  ? ? Medical Summary ?Current Status: stool via  rectum- not from colostomy today- WOC/nursing to work on how to handle her colostomy- Home O2/on 3L- endurance is very poor-colostomy- continent of bladder usually- urgency ? Barriers to Discharge: Weight;Wound care;Home enviroment access/layout;Decreased family/caregiver support;Medical stability;Weight bearing restrictions ? Barriers to Discharge Comments: home O2; endurance is poor- made supervision for goals- needs family education priopr to d/c. ?Possible Resolutions to [employer]: can only work on transfers- is supervision-  NWB except for transfers- but poor endurance anyway- d/c 4/10- is Supervision to light CGA. =- ? ? ?Continued Need for Acute Rehabilitation Level of Care: The patient requires daily medical management by a physician with specialized training in physical medicine and rehabilitation for the following reasons: ?Direction of a multidisciplinary physical rehabilitation program to maximize functional independence : Yes ?Medical management of patient stability for increased activity during participation in an intensive rehabilitation regime.: Yes ?Analysis of laboratory values and/or radiology reports with any subsequent need for medication adjustment and/or medical intervention. : Yes ? ? ?I attest that I was present, lead the team conference, and concur with the assessment and plan of the team. ? ? ?Dorien Chihuahua B ?01/18/2022, 1:27 PM  ? ? ? ? ? ? ?

## 2022-01-18 NOTE — Progress Notes (Signed)
Pt had episode of  liquid brown stool from rectum. No stool or gas in colostomy throughout night. PA Marlowe Sh[place] made aware, new order received.  ?

## 2022-01-18 NOTE — Progress Notes (Signed)
Physical Therapy Session Note ? ?Patient Details  ?Name: Holly [name] ?MRN: [id number] ?Date of Birth: Feb 16, 1946 ? ?Today's Date: 01/18/2022 ?PT Individual Time: 4098-1191 ?PT Individual Time Calculation (min): 70 min  ? ?Short Term Goals: ?Week 1:  PT Short Term Goal 1 (Week 1): Patient will perform basic transfers with CGA. ?PT Short Term Goal 2 (Week 1): Patient will propel w/c >50 ft with supervision. ? ?Skilled Therapeutic Interventions/Progress Updates:  ?  Patient received sitting up in bed, agreeable to PT. She reports 7/10 pain in her abdomen, premedicated. PT providing rest breaks, distractions and repositioning to assist with pain management. Patient also reporting that she is "leaking out her butt." PT confirming with RN that patient may wear brief out of bed. Rolling with supervision and use of bed rails to place brief. Able to come sit edge of bed with supervision. CGA stand pivot to wc. PT transporting patient in wc to therapy gym for time management and energy conservation. She completed x1 sit <> stand with RW to adjust nightgown with supervision. Patient able to propel herself in wc x165ft with supervision. Seated therex as follows: LAQ, marches, hip abd, add, HSC, ankle pumps. Patient requesting to return to her room to use the bathroom. En route, patient incontinent of urine (brief in place). CGA stand pivot to [facility] with RW. Patient with further void and small BM as well (RN made aware). She was able to complete perihygiene in standing with CGA for balance. PT changing brief TotalA in standing. Patient requesting to remain up in chair, needs within reach.  ? ?Therapy Documentation ?Precautions:  ?Precautions ?Precautions: Fall ?Precaution Comments: colostomy; rt quadrant drain;  recent left tibia fx ?Restrictions ?Weight Bearing Restrictions: Yes ?LLE Weight Bearing: Weight bearing as tolerated ?Other Position/Activity Restrictions: WBAT for transfers only ? ? ? ?Therapy/Group: Individual  Therapy ? ?Debbora Dus ?Debbora Dus, PT, DPT, CBIS ? ?01/18/2022, 7:59 AM  ?

## 2022-01-18 NOTE — Progress Notes (Signed)
Occupational Therapy Session Note ? ?Patient Details  ?Name: Holly [name] ?MRN: [id number] ?Date of Birth: 02/14/1946 ? ?Today's Date: 01/18/2022 ?OT Individual Time: 9528-4132 ?OT Individual Time Calculation (min): 60 min  ? ? ?Short Term Goals: ?Week 1:  OT Short Term Goal 1 (Week 1): The pt will complete UB/LB bathing with with MinA using AE of a long hand sponge as needed ?OT Short Term Goal 2 (Week 1): The pt will complete UB/LB dressing with MinA using AE of a reacher for LE dressing as needed. ?OT Short Term Goal 3 (Week 1): The pt will present with UB strength of 3+/5 MMT for safe and ind task persormance ?OT Short Term Goal 4 (Week 1): The pt will complete toilet transfers with ModI for safe and independent function fot transitioning to home. ?OT Short Term Goal 5 (Week 1): The pt will complete sit to stand with S incorporating RW for ind and safety during functional task completeion. ? ?Skilled Therapeutic Interventions/Progress Updates:  ?  Pt received in bed agreeable to therapy.  Purewick removed, pt sat to EOB independently.  Pt used RW to transfer to w/c.  Pt started to have loose bowels, had to hold towels to prevent it from dripping onto floor so pt actually transferred with Supervision as I was holding towels! Assisted pt with cleansing.  Pt sat in wc to bathe UB  and change gowns. ?Pt stated she had to toilet, brought BSC to pt and she transferred with Supervision. Only a very small void of urine.  Pt stated she was exhausted. Transferred back to EOB and pt out of breath.    ?Pt stated she usually get this out of breath this fast due to her prior history of lung cancer, COPD and emphysema.  ?Due to abdominal wound pain, she does need A to lift legs up into the bed.   ?Pt able to get herself positioned in bed (scooting up and to the side) but will frequently ask for help. Encouraged her to always do as much she can herself.  ?Once in bed pt worked on UE AROM exercises for her shoulders and endurance.   ?Pt resting in bed with all needs met.  ? ? ?Therapy Documentation ?Precautions:  ?Precautions ?Precautions: Fall ?Precaution Comments: colostomy; rt quadrant drain;  recent left tibia fx ?Restrictions ?Weight Bearing Restrictions: Yes ?LLE Weight Bearing: Weight bearing as tolerated ?Other Position/Activity Restrictions: WBAT for transfers only ? ?  ?Vital Signs: ?Therapy Vitals ?Temp: 98.5 ?F (36.9 ?C) ?Temp Source: Oral ?Pulse Rate: 72 ?Resp: 16 ?BP: (!) 174/64 ?Patient Position (if appropriate): Lying ?Oxygen Therapy ?SpO2: 98 % ?O2 Device: Nasal Cannula ?O2 Flow Rate (L/min): 3 L/min ?Pain: ?Pain Assessment ?Pain Scale: 0-10 ?Pain Score: 0-No pain ?ADL: ?ADL ?Eating: Independent ?Where Assessed-Eating:  (based on functional task completion.) ?Grooming: Independent ?Where Assessed-Grooming: Sitting at sink ?Upper Body Bathing: Setup ?Where Assessed-Upper Body Bathing: Sitting at sink ?Lower Body Bathing: Contact guard ?Where Assessed-Lower Body Bathing: Sitting at sink ?Upper Body Dressing: Setup ?Where Assessed-Upper Body Dressing: Edge of bed ?Lower Body Dressing: Minimal assistance ?Where Assessed-Lower Body Dressing: Edge of bed ?Toileting: Contact guard ?Where Assessed-Toileting: Bedside Commode ?Toilet Transfer: Close supervision ?Toilet Transfer Method: Stand pivot ?Toilet Transfer Equipment: Bedside commode ?Tub/Shower Transfer: Not assessed (pt has an abdominal wound and cannot shower at this time) ?Tub/Shower Transfer Method:  (Additional assessing needed) ?Walk-In Shower Transfer:  (more assessing needed) ?Walk-In Shower Transfer Method: Stand pivot (patient demonstrates the ability to complete stand pivot based  on her performance at EOB) ?Walk-In Shower Equipment:  (Patient has a walk in shower with a shower chair to assist with performance.) ? ? ?Therapy/Group: Individual Therapy ? ?[place] ?01/18/2022, 9:26 AM ?

## 2022-01-19 ENCOUNTER — Inpatient Hospital Stay (HOSPITAL_COMMUNITY): Payer: Medicare Other

## 2022-01-19 ENCOUNTER — Ambulatory Visit: Payer: Medicare Other | Admitting: Infectious Disease

## 2022-01-19 DIAGNOSIS — R5381 Other malaise: Secondary | ICD-10-CM | POA: Diagnosis not present

## 2022-01-19 LAB — CBC WITH DIFFERENTIAL/PLATELET
Abs Immature Granulocytes: 0.06 10*3/uL (ref 0.00–0.07)
Basophils Absolute: 0 10*3/uL (ref 0.0–0.1)
Basophils Relative: 0 %
Eosinophils Absolute: 0.2 10*3/uL (ref 0.0–0.5)
Eosinophils Relative: 2 %
HCT: 30.3 % — ABNORMAL LOW (ref 36.0–46.0)
Hemoglobin: 9 g/dL — ABNORMAL LOW (ref 12.0–15.0)
Immature Granulocytes: 1 %
Lymphocytes Relative: 26 %
Lymphs Abs: 2.1 10*3/uL (ref 0.7–4.0)
MCH: 28.3 pg (ref 26.0–34.0)
MCHC: 29.7 g/dL — ABNORMAL LOW (ref 30.0–36.0)
MCV: 95.3 fL (ref 80.0–100.0)
Monocytes Absolute: 0.5 10*3/uL (ref 0.1–1.0)
Monocytes Relative: 6 %
Neutro Abs: 5.1 10*3/uL (ref 1.7–7.7)
Neutrophils Relative %: 65 %
Platelets: 264 10*3/uL (ref 150–400)
RBC: 3.18 MIL/uL — ABNORMAL LOW (ref 3.87–5.11)
RDW: 18.4 % — ABNORMAL HIGH (ref 11.5–15.5)
WBC: 7.9 10*3/uL (ref 4.0–10.5)
nRBC: 0 % (ref 0.0–0.2)

## 2022-01-19 LAB — BASIC METABOLIC PANEL
Anion gap: 4 — ABNORMAL LOW (ref 5–15)
BUN: 12 mg/dL (ref 8–23)
CO2: 39 mmol/L — ABNORMAL HIGH (ref 22–32)
Calcium: 9.6 mg/dL (ref 8.9–10.3)
Chloride: 98 mmol/L (ref 98–111)
Creatinine, Ser: 0.68 mg/dL (ref 0.44–1.00)
GFR, Estimated: >60 mL/min (ref >60)
Glucose, Bld: 92 mg/dL (ref 70–99)
Potassium: 3.5 mmol/L (ref 3.5–5.1)
Sodium: 141 mmol/L (ref 135–145)

## 2022-01-19 IMAGING — XA IR PICC >5YO
2 series · 3 of 3 positions shown · IV contrast (agent unspecified)
Comparison: none

INDICATION: Request for PICC for long-term antibiotics due to osteomyelitis.

EXAM:
ULTRASOUND AND FLUOROSCOPIC GUIDED PICC LINE INSERTION
MEDICATIONS:
1 cc 1% lidocaine
CONTRAST:  None
FLUOROSCOPY TIME:  seconds (1 mGy)
COMPLICATIONS:
None immediate.
TECHNIQUE: The procedure, risks, benefits, and alternatives were explained to
the patient and informed written consent was obtained.

[Series 1: ir picc >5yo · 2 of 2 slices shown]
[im 1/2]
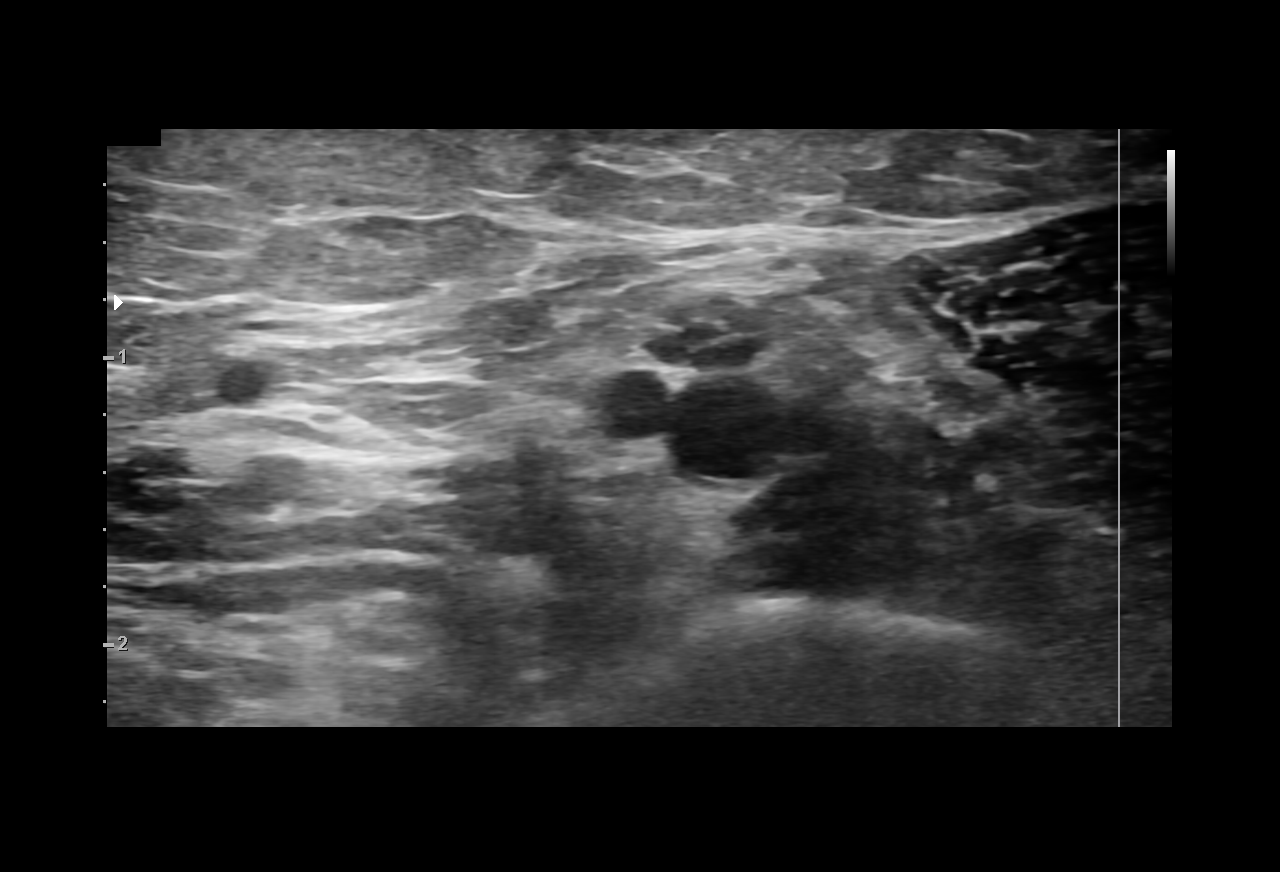
[im 2/2]
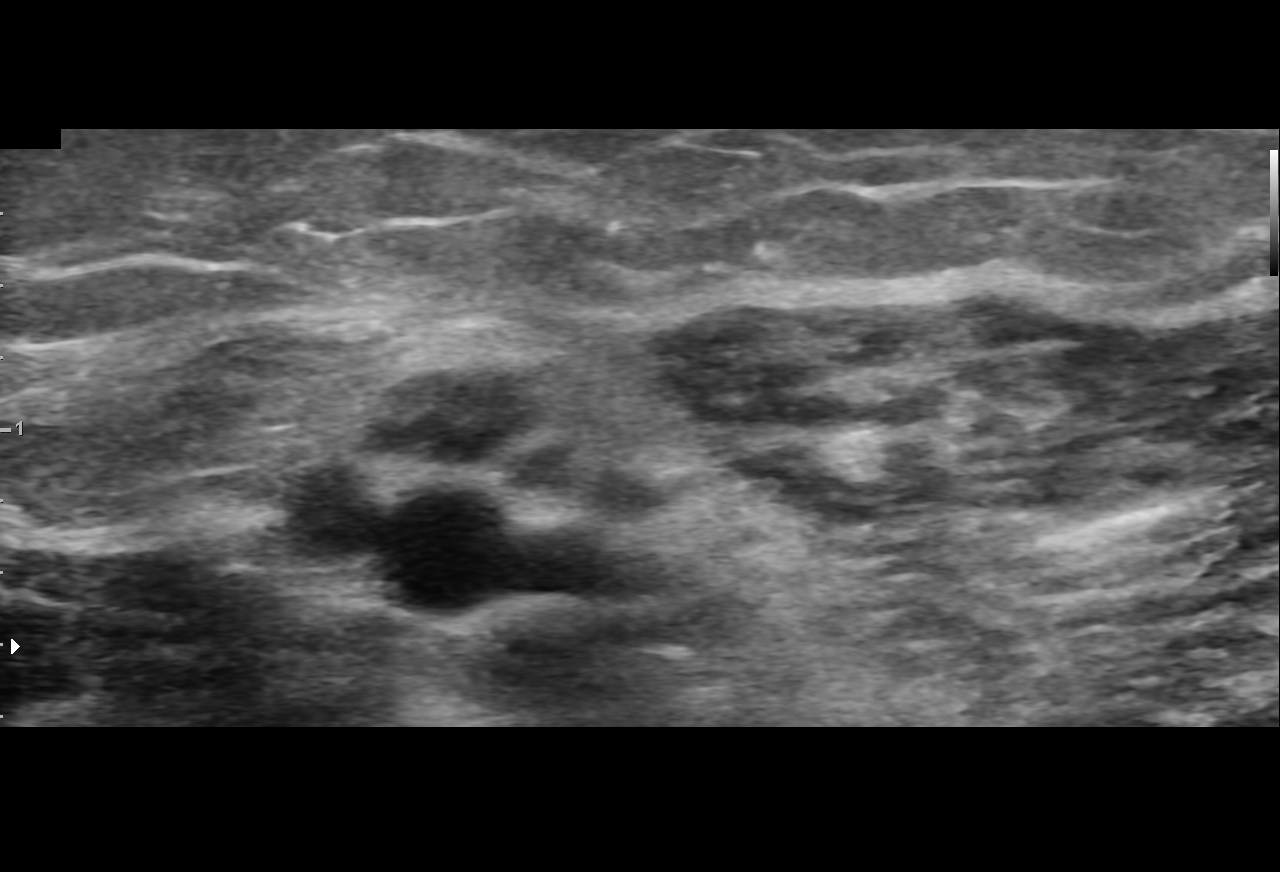

[Series 1: fl (-) angio · 1 of 1 slices shown]
[im 1/1]
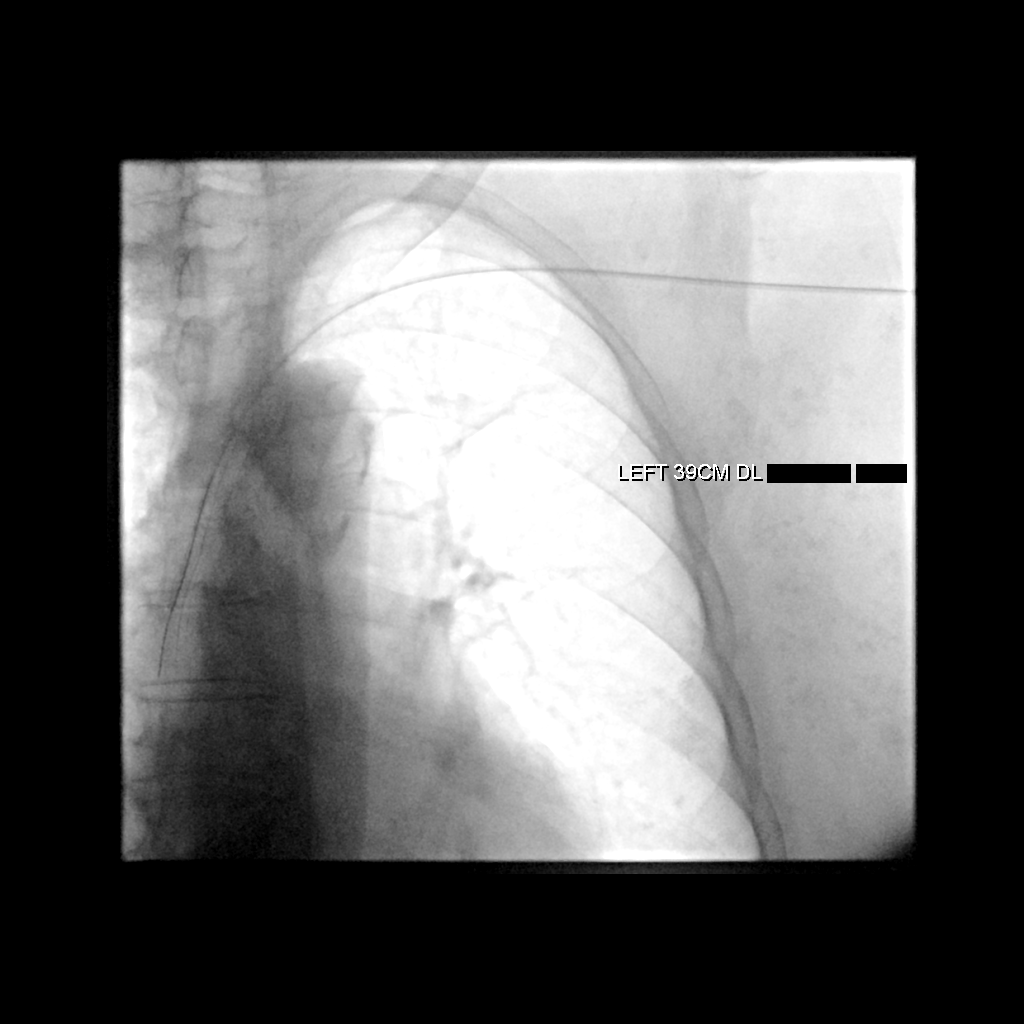

[3 of 3 positions shown; findings below may reference images not displayed]

The left upper extremity was prepped with chlorhexidine in a sterile
fashion, and a sterile drape was applied covering the operative
field. Maximum barrier sterile technique with sterile gowns and
gloves were used for the procedure. A timeout was performed prior to
the initiation of the procedure. Local anesthesia was provided with
1% lidocaine.

After the overlying soft tissues were anesthetized with 1%
lidocaine, a micropuncture kit was utilized to access the left
brachial vein. Real-time ultrasound guidance was utilized for
vascular access including the acquisition of a permanent ultrasound
image documenting patency of the accessed vessel.

A guidewire was advanced to the level of the superior caval-atrial
junction for measurement purposes and the PICC line was cut to
length. A peel-away sheath was placed and a 39 cm, 5 French, dual
lumen was inserted to level of the superior caval-atrial junction. A
post procedure spot fluoroscopic was obtained. The catheter easily
aspirated and flushed and was secured in place. A dressing was
placed. The patient tolerated the procedure well without immediate
post procedural complication.
FINDINGS: After catheter placement, the tip lies within the superior
cavoatrial junction. The catheter aspirates and flushes normally and
is ready for immediate use.
IMPRESSION: Successful ultrasound and fluoroscopic guided placement of a left
brachial vein approach, 39 cm, 5 French, dual lumen PICC with tip at
the superior caval-atrial junction. The PICC line is ready for
immediate use.

## 2022-01-19 MED ORDER — LIDOCAINE HCL 1 % IJ SOLN
INTRAMUSCULAR | Status: AC
  Filled 2022-01-19: qty 20

## 2022-01-19 MED ORDER — LIDOCAINE HCL (PF) 1 % IJ SOLN
INTRAMUSCULAR | Status: DC | PRN
  Administered 2022-01-19: 10 mL

## 2022-01-19 MED ORDER — CHLORHEXIDINE GLUCONATE CLOTH 2 % EX PADS
6.0000 | MEDICATED_PAD | Freq: Every day | CUTANEOUS | Status: DC
  Administered 2022-01-19 – 2022-01-27 (×7): 6 via TOPICAL

## 2022-01-19 NOTE — Progress Notes (Signed)
Physical Therapy Note ? ?Patient Details  ?Name: Holly [name] ?MRN: [id number] ?Date of Birth: 06-Aug-1946 ?Today's Date: 01/19/2022 ? ? ? ?Upon entry pt being prepared by transport for central line placement. Pt missed 30 min skilled PT due to being taken off unit.   ? ?[name] ?01/19/2022, 2:44 PM  ?

## 2022-01-19 NOTE — Progress Notes (Signed)
Physical Therapy Session Note ? ?Patient Details  ?Name: Holly [name] ?MRN: [id number] ?Date of Birth: 03/12/1946 ? ?Today's Date: 01/19/2022 ?PT Individual Time: 0757-3225 ?PT Individual Time Calculation (min): 30 min  ? ?Short Term Goals: ?Week 1:  PT Short Term Goal 1 (Week 1): Patient will perform basic transfers with CGA. ?PT Short Term Goal 2 (Week 1): Patient will propel w/c >50 ft with supervision. ? ?Skilled Therapeutic Interventions/Progress Updates:  ?   ? ?Pt missed first 30 minutes of therapy due to IV team placing PICC with sign stating "Sterile procedure in progress, do not enter."  ? ?Returned to her room with pt lying supine in bed and agreeable to PT tx - she denies pain. On 3L wall O2. Pt completed bed mobilty mod I with hospital bed features. Able to sit EOB mod I with no UE support. Completed stand<>pivot transfer using RW from EOB to w/c with supervision assist and cues for safety awareness while turning to sit. While seated in w/c, completed seated there-ex using 4# ankle weight. LAQ, hip marches, and knee extension isometrics x5 sec. Also completed 1x5 sit<>stand to RW to practice functional strengthening to assist with carryover into transfers and eventual gait when WB restrictions allow. ? ?We discussed DC planning, DME needs, and family education for Saturday. Pt asking about car transfers and will relay to primary therapist.  ? ?Pt concluded session seated in w/c, all needs met, breakfast setup for her. ? ?Therapy Documentation ?Precautions:  ?Precautions ?Precautions: Fall ?Precaution Comments: colostomy; rt quadrant drain;  recent left tibia fx ?Restrictions ?Weight Bearing Restrictions: Yes ?LLE Weight Bearing: Weight bearing as tolerated ?Other Position/Activity Restrictions: WBAT for transfers only ?General: ?  ? ?Therapy/Group: Individual Therapy ? ?Burnice Oestreicher P Tammy Ericsson ?01/19/2022, 7:55 AM  ?

## 2022-01-19 NOTE — Progress Notes (Signed)
Patient ID: Holly [name], female   DOB: [birth year], 76 y.o.   MRN: [id number] ?Follow up with supplies for ostomy care and sacral wound for discharge. Notified patient of delivery and storage of supplies in room. Dorien Chihuahua B ? ?

## 2022-01-19 NOTE — Progress Notes (Signed)
When ready for bed, pt places herself on CPAP.  ?

## 2022-01-19 NOTE — Procedures (Signed)
Successful placement of dual lumen PICC line to left brachial vein. ?Length 39 cm ?Tip at lower SVC/RA ?PICC capped ?No complications ?Ready for use. ? ?EBL < 5 mL ? ? ?Tyson Alias, AGNP ?01/19/2022 ?3:28 PM ? ?   ?

## 2022-01-19 NOTE — Progress Notes (Signed)
Physical Therapy Session Note ? ?Patient Details  ?Name: Holly [name] ?MRN: [id number] ?Date of Birth: May 27, 1946 ? ?Today's Date: 01/19/2022 ?PT Individual Time: 1000-1040 ?PT Individual Time Calculation (min): 40 min  ? ?Short Term Goals: ?Week 1:  PT Short Term Goal 1 (Week 1): Patient will perform basic transfers with CGA. ?PT Short Term Goal 2 (Week 1): Patient will propel w/c >50 ft with supervision. ? ?Skilled Therapeutic Interventions/Progress Updates:  ?  Patient received sitting up in wc, agreeable to PT. She reports 8/10 pain in her abdomen, premedicated. PT providing rest breaks, distractions and repositioning to assist with pain management. She requested to shampoo her hair. Patient propelling herself in the wc ~177ft using B UE and CGA for turns. PT retrieved shampoo cap from closet. Patient able to engage B UE to apply shampoo cap and run into her hair. Patient brushing and blowdrying her hair with set up assist. PT providing patient with HEP. Remaining up in wc, needs within reach.  ? ?Therapy Documentation ?Precautions:  ?Precautions ?Precautions: Fall ?Precaution Comments: colostomy; rt quadrant drain;  recent left tibia fx ?Restrictions ?Weight Bearing Restrictions: Yes ?LLE Weight Bearing: Weight bearing as tolerated ?Other Position/Activity Restrictions: WBAT for transfers only ? ? ? ?Therapy/Group: Individual Therapy ? ?Debbora Dus ?Debbora Dus, PT, DPT, CBIS ? ?01/19/2022, 7:40 AM  ?

## 2022-01-19 NOTE — Progress Notes (Signed)
?                                                       PROGRESS NOTE ? ? ?Subjective/Complaints: ? ?Was supposed to to get PICC today, but vessels too small per PICC team- will need a tunneled catheter and on Eliquis.  ? ?Nose hurts form CPAP- needs bandiad.  ?Is burping and emptying Colostomy, but hasn't learned to change it yet.  ? ? ? ?ROS: ? ?Pt denies SOB, abd pain, CP, N/V/C/D, and vision changes ? ?Objective: ?  ?[place] EKG SITE RITE ? ?Result Date: 01/18/2022 ?If [employer] not attached, placement could not be confirmed due to current cardiac rhythm.  ?Recent Labs  ?  01/17/22 ?1610 01/19/22 ?9604  ?WBC 12.3* 7.9  ?HGB 10.4* 9.0*  ?HCT 35.9* 30.3*  ?PLT 266 264  ? ?Recent Labs  ?  01/17/22 ?5409 01/19/22 ?8119  ?NA 137 141  ?K 4.0 3.5  ?CL 100 98  ?CO2 30 39*  ?GLUCOSE 97 92  ?BUN 10 12  ?CREATININE 0.65 0.68  ?CALCIUM 9.0 9.6  ? ? ?Intake/Output Summary (Last 24 hours) at 01/19/2022 0827 ?Last data filed at 01/19/2022 0515 ?Gross per 24 hour  ?Intake 295 ml  ?Output 1300 ml  ?Net -1005 ml  ?  ? ?Pressure Injury 01/06/22 Sacrum Deep Tissue Pressure Injury - Purple or maroon localized area of discolored intact skin or blood-filled blister due to damage of underlying soft tissue from pressure and/or shear. (Active)  ?01/06/22 1603  ?Location: Sacrum  ?Location Orientation:   ?Staging: Deep Tissue Pressure Injury - Purple or maroon localized area of discolored intact skin or blood-filled blister due to damage of underlying soft tissue from pressure and/or shear.  ?Wound Description (Comments):   ?Present on Admission: Yes  ? ? ?Physical Exam: ?Vital Signs ?Blood pressure (!) 176/62, pulse 63, temperature 97.6 ?F (36.4 ?C), temperature source Oral, resp. rate 16, height 5' (1.524 m), weight 64.7 kg, SpO2 93 %. ? ? ? ? ?General: awake, alert, appropriate, supine in bed; NAD ?HENT: conjugate gaze; oropharynx moist ?CV: regular rate; no JVD ?Pulmonary: CTA B/L; no W/R/R- good air movement ?GI: soft, NT, ND, (+)BS-  incision looks ok- C/D/I- colostomy in place- very tiny amount of stool in bag.  ?Psychiatric: appropriate ?Neurological: Ox3 ? ?Skin: ?  General: Skin is warm and dry.  ?   Comments: Midline incision and left lower extremity incisions healing without signs of infection  ?Neurological:  ?   Mental Status: She is alert and oriented to person, place, and time.  ?   Motor: Weakness present.  ?   Comments: Weak dorsiflexion of right foot  ? ?Assessment/Plan: ?1. Functional deficits which require 3+ hours per day of interdisciplinary therapy in a comprehensive inpatient rehab setting. ?Physiatrist is providing close team supervision and 24 hour management of active medical problems listed below. ?Physiatrist and rehab team continue to assess barriers to discharge/monitor patient progress toward functional and medical goals ? ?Care Tool: ? ?Bathing ?   ?Body parts bathed by patient: Right arm, Left arm, Chest, Abdomen, Front perineal area, Buttocks, Face, Right upper leg, Left upper leg  ? Body parts bathed by helper: Right lower leg, Left lower leg ?  ?  ?Bathing assist Assist Level: Minimal Assistance - Patient > 75% ?  ?  ?  Upper Body Dressing/Undressing ?Upper body dressing   ?What is the patient wearing?: Dress ?   ?Upper body assist Assist Level: Supervision/Verbal cueing ?   ?Lower Body Dressing/Undressing ?Lower body dressing ? ? ?   ?What is the patient wearing?: Incontinence brief ? ?  ? ?Lower body assist Assist for lower body dressing: Minimal Assistance - Patient > 75% ?   ? ?Toileting ?Toileting    ?Toileting assist Assist for toileting: Moderate Assistance - Patient 50 - 74% ?  ?  ?Transfers ?Chair/bed transfer ? ?Transfers assist ?   ? ?Chair/bed transfer assist level: Supervision/Verbal cueing ?  ?  ?Locomotion ?Ambulation ? ? ?Ambulation assist ? ? Ambulation activity did not occur: Safety/medical concerns ? ?  ?  ?   ? ?Walk 10 feet activity ? ? ?Assist ? Walk 10 feet activity did not occur:  Safety/medical concerns ? ?  ?   ? ?Walk 50 feet activity ? ? ?Assist Walk 50 feet with 2 turns activity did not occur: Safety/medical concerns ? ?  ?   ? ? ?Walk 150 feet activity ? ? ?Assist Walk 150 feet activity did not occur: Safety/medical concerns ? ?  ?  ?  ? ?Walk 10 feet on uneven surface  ?activity ? ? ?Assist Walk 10 feet on uneven surfaces activity did not occur: Safety/medical concerns ? ? ?  ?   ? ?Wheelchair ? ? ? ? ?Assist Is the patient using a wheelchair?: Yes ?Type of Wheelchair: Manual ?  ? ?Wheelchair assist level: Dependent - Patient 0% (limited by pain) ?   ? ? ?Wheelchair 50 feet with 2 turns activity ? ? ? ?Assist ? ?  ?  ? ? ?Assist Level: Dependent - Patient 0%  ? ?Wheelchair 150 feet activity  ? ? ? ?Assist ?   ? ? ?Assist Level: Dependent - Patient 0%  ? ?Blood pressure (!) 176/62, pulse 63, temperature 97.6 ?F (36.4 ?C), temperature source Oral, resp. rate 16, height 5' (1.524 m), weight 64.7 kg, SpO2 93 %. ? ?Medical Problem List and Plan: ?1. Functional deficits secondary to perforated diverticulum, exploratory laparotomy with colostomy, recent tibial plateau fracture repair. ?            -patient may shower but colostomy must be covered ?            -ELOS/Goals: MinA/S 12-16 days     ?            d/c  4/10-  ? Con't  CIR_ PT and OT ?2.  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            -antiplatelet therapy: none ?3. Pain: continue Tylenol, oxycodone PRN ?            --Robaxin 500 mg q 6 hours ? 4/4- pain controlled- con't regimen ?4. Mood: LCSW to evaluate and provide emotional support ?            -continue Wellbutrin XL 300 mg q AM ?            -continue Buspar 5 mg BID ?            --continue Xanax 0.5 mg prn anxiety ?            -antipsychotic agents: n/a ?5. Neuropsych: This patient is capable of making decisions on her own behalf. ?6. Skin/Wound Care: Routine skin care checks ?            --  Monitor midline incision, LLE incisions ?            -- Routine stoma care ?            --Monitor RUE bullous lesions ? 3/31- staples not ready to come out- surgery was 3/21- likely d/c next week  ? 4/4- will find out from surgeon when can remove staples- is go by days, they're due to be removed.  ?7. 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 ?10: COPD: continue chronic prednisone 20 mg daily ?            -continue Breo Ellipta + Incruse Ellipta ?            --albuterol nebs prn ? 3/30- on O2 3L -home O2- con't ?11: OSA: continue CPAP nightly; ? O2 bleed in ?12: Recent repair of left tibial plateau fracture with bone biopsies ?            --s/p ORIF by Dr Doreatha Martin 12/13/2021 ?            --weight bearing with transfers only; unrestricted ROM ?            --bone culture +MRSA ?            --continue daptomycin through 4/26 ?            --follow-up with ID ? 3/30- WBC down to 9.4 from 13k- will con't to monitor and recheck ESR and CRP weekly.  ?13: Hypertension: continue Toprol XL 50 mg daily ?Vitals:  ? 01/19/22 0447 01/19/22 0630  ?BP: (!) 176/62   ?Pulse: 63   ?Resp:    ?Temp: 97.6 ?F (36.4 ?C)   ?SpO2: 100% 93%  ? 4/4- BP rising- will restart Cozaar 25 mg daily- and monitor ? 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 ?  Latest Ref Rng & Units 01/19/2022  ?  5:28 AM 01/17/2022  ?  5:14 AM 01/13/2022  ?  5:07 AM  ?BMP  ?Glucose 70 - 99 mg/dL 92   97   86    ?BUN 8 - 23 mg/dL _0 ?Creatinine 0.44 - 1.00 mg/dL 0.68   0.65   0.82    ?Sodium 135 - 145 mmol/L 141   137   142    ?Potassium 3.5 - 5.1 mmol/L 3.5   4.0   3.7    ?Chloride 98 - 111 mmol/L 98   100   104    ?CO2 22 - 32 mmol/L 39   30   31    ?Calcium 8.9 - 10.3 mg/dL 9.6   9.0   8.8    ? ?Renal fx  stable should be able to resume cozaar  ?15: Hypothyroidism: continue Synthroid ?16: ? Cognitive decline: on Aricept ?17: Hyperlipidemia: continue Lipitor ?18: Insomnia/poor sleep hygiene: continue Remeron 30 mg q HS

## 2022-01-19 NOTE — Progress Notes (Signed)
? ?  Subjective/Chief Complaint: ?Doing well.  Eating well.  Colostomy has been working and needs to be burped right now ? ? ?Objective: ?Vital signs in last 24 hours: ?Temp:  [97.6 ?F (36.4 ?C)-98.7 ?F (37.1 ?C)] 97.6 ?F (36.4 ?C) (04/05 0447) ?Pulse Rate:  [58-66] 63 (04/05 0447) ?Resp:  [14-16] 16 (04/04 1954) ?BP: (132-176)/(56-62) 176/62 (04/05 0447) ?SpO2:  [93 %-100 %] 93 % (04/05 0822) ?Last BM Date : 01/19/22 ? ?Intake/Output from previous day: ?04/04 0701 - 04/05 0700 ?In: 38 [P.O.:295] ?Out: 1300 [Urine:1300] ?Intake/Output this shift: ?No intake/output data recorded. ? ?Gen:  Alert, NAD, pleasant ?Abd: soft, ND, NT, midline c/d/I. colostomy viable with lots of air, solid stool and some liquid stool  ? ? ?Lab Results:  ?Recent Labs  ?  01/17/22 ?7902 01/19/22 ?4097  ?WBC 12.3* 7.9  ?HGB 10.4* 9.0*  ?HCT 35.9* 30.3*  ?PLT 266 264  ? ?BMET ?Recent Labs  ?  01/17/22 ?3532 01/19/22 ?9924  ?NA 137 141  ?K 4.0 3.5  ?CL 100 98  ?CO2 30 39*  ?GLUCOSE 97 92  ?BUN 10 12  ?CREATININE 0.65 0.68  ?CALCIUM 9.0 9.6  ? ?PT/INR ?No results for input(s): LABPROT, INR in the last 72 hours. ?ABG ?No results for input(s): PHART, HCO3 in the last 72 hours. ? ?Invalid input(s): PCO2, PO2 ? ?Studies/Results: ?[place] EKG SITE RITE ? ?Result Date: 01/18/2022 ?If [employer] not attached, placement could not be confirmed due to current cardiac rhythm.  ? ?Anti-infectives: ?Anti-infectives (From admission, onward)  ? ? Start     Dose/Rate Route Frequency Ordered Stop  ? 01/15/22 0800  fluconazole (DIFLUCAN) tablet 100 mg       ? 100 mg Oral Daily 01/14/22 0814 01/21/22 0759  ? 01/14/22 0900  fluconazole (DIFLUCAN) tablet 200 mg       ? 200 mg Oral  Once 01/14/22 0814 01/14/22 0938  ? 01/12/22 2000  DAPTOmycin (CUBICIN) 500 mg in sodium chloride 0.9 % IVPB       ? 8 mg/kg ? 62.1 kg ?120 mL/hr over 30 Minutes Intravenous Daily 01/12/22 1717 02/09/22 2359  ? ?  ? ? ?Assessment/Plan: ?POD 15, s/p exploratory laparotomy with sigmoid  colectomy and colostomy on 01/04/22 with Dr. Thermon Leyland for complicated diverticulitis with multiple abscesses not amenable to IR drainage who failed antibiotic treatment  ?- surgical path benign - perforated diverticulitis ?- Soft diet. ?- ostomy working well.  Expect for her output to change from day to day as her colon gets back to its normal functioning.  She may have 1 solid BM a day as she did before, or some days it may be more soft or liquid.   ?-no need for CT scan or other imaging at this time. ?-do NOT give enema through rectum.  She has a rectal stump and this pressure may blow it out. ?-will sign off.  She has follow up with our office once she is out of rehab ?  ?FEN - soft, Boost ?VTE - Eliquis ?ID - cubicin, for infected hardware ?  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 LOS: 7 days  ? ? ?Holly [name] ?01/19/2022 ? ?

## 2022-01-19 NOTE — Progress Notes (Signed)
PICC consult: No suitable vein found for cannulation. RUE restricted due to DVT. LUE assessed with ultrasound. Veins are too small for PICC. Please consider central line. ?

## 2022-01-19 NOTE — Progress Notes (Signed)
Occupational Therapy Session Note ? ?Patient Details  ?Name: Holly [name] ?MRN: [id number] ?Date of Birth: 09-May-1946 ? ?Today's Date: 01/19/2022 ?OT Individual Time: 0933-1000 ?OT Individual Time Calculation (min): 27 min  ? ? ?Short Term Goals: ?Week 1:  OT Short Term Goal 1 (Week 1): The pt will complete UB/LB bathing with with MinA using AE of a long hand sponge as needed ?OT Short Term Goal 2 (Week 1): The pt will complete UB/LB dressing with MinA using AE of a reacher for LE dressing as needed. ?OT Short Term Goal 3 (Week 1): The pt will present with UB strength of 3+/5 MMT for safe and ind task persormance ?OT Short Term Goal 4 (Week 1): The pt will complete toilet transfers with ModI for safe and independent function fot transitioning to home. ?OT Short Term Goal 5 (Week 1): The pt will complete sit to stand with S incorporating RW for ind and safety during functional task completeion. ?Week 2:  OT Short Term Goal 1 (Week 2): The pt will complete UB/LB bathing with with MinA using AE of a long hand sponge as needed ?OT Short Term Goal 2 (Week 2): The pt will complete UB/LB dressing with MinA using AE of a reacher for LE dressing as needed. ?OT Short Term Goal 3 (Week 2): The pt will present with UB strength of 3+/5 MMT for safe and ind task persormance ?OT Short Term Goal 4 (Week 2): The pt will complete toilet transfers with ModI for safe and independent function fot transitioning to home. ?OT Short Term Goal 5 (Week 2): The pt will complete sit to stand with S incorporating RW for ind and safety during functional task completeion. ? ?Skilled Therapeutic Interventions/Progress Updates:  ?Patient met seated in wc in agreement with OT treatment session. Mild pain reported in L side at rest. Patient reports waiting to get pain meds as PIC line to be placed today. Patient completed stand-pivot to [facility] with close supervision A and Min cues for hand placement. Able to complete 3/3 parts of toielting task with S and  burp ostomy bag with Min cues. BUE HEP with 3lb weighted dowel. Please refer below for additional details. Patient demonstrating decreased STM with inability to recall exercises after rest break between sets. Session concluded with patient seated in wc with call bell within reach and all needs met.  ? ?BUE HEP 2 sets x5 reps each ?Straight arm raises ?Chest press ?Arm curls ? ?Therapy Documentation ?Precautions:  ?Precautions ?Precautions: Fall ?Precaution Comments: colostomy; rt quadrant drain;  recent left tibia fx ?Restrictions ?Weight Bearing Restrictions: Yes ?LLE Weight Bearing: Weight bearing as tolerated ?Other Position/Activity Restrictions: WBAT for transfers only ?General: ?  ?Therapy/Group: Individual Therapy ? ?[name] ?01/19/2022, 6:59 AM ?

## 2022-01-19 NOTE — Discharge Instructions (Addendum)
Inpatient Rehab Discharge Instructions ? ?Holly [name] ?Discharge date and time: 01/27/2022 ? ?Activities/Precautions/ Functional Status: ?Activity: no lifting, driving, or strenuous exercise for until cleared by MD ?Diet: regular diet ?Wound Care: keep wound clean and dry ?Functional status:  ?___ No restrictions     ___ Walk up steps independently ?___ 24/7 supervision/assistance   ___ Walk up steps with assistance ?__x_ Intermittent supervision/assistance  ___ Bathe/dress independently ?___ Walk with walker     ___ Bathe/dress with assistance ?___ Walk Independently    ___ Shower independently ?___ Walk with assistance    __x_ Shower with assistance ?_x__ No alcohol     ___ Return to work/school ________ ? ?Special Instructions: ? ?No driving, alcohol consumption or tobacco use.  ? ?Please bring this form and your medication list with you to all your follow-up doctor's appointments.   ? ? ?Information on my medicine - ELIQUIS? (apixaban)- follow-up with provider on when to restart ? ?This medication education was reviewed with me or my healthcare representative as part of my discharge preparation.   ? ?Why was Eliquis? prescribed for you? ?Eliquis? was prescribed to treat blood clots that may have been found in the veins of your legs (deep vein thrombosis) or in your lungs (pulmonary embolism) and to reduce the risk of them occurring again. ? ?What do You need to know about Eliquis? ? ?Take ONE 5 mg tablet taken TWICE daily.  Eliquis? may be taken with or without food.  ? ?Try to take the dose about the same time in the morning and in the evening. If you have difficulty swallowing the tablet whole please discuss with your pharmacist how to take the medication safely. ? ?Take Eliquis? exactly as prescribed and DO NOT stop taking Eliquis? without talking to the doctor who prescribed the medication.  Stopping may increase your risk of developing a new blood clot.  Refill your prescription before you run  out. ? ?After discharge, you should have regular check-up appointments with your healthcare provider that is prescribing your Eliquis?. ?   ?What do you do if you miss a dose? ?If a dose of ELIQUIS? is not taken at the scheduled time, take it as soon as possible on the same day and twice-daily administration should be resumed. The dose should not be doubled to make up for a missed dose. ? ?Important Safety Information ?A possible side effect of Eliquis? is bleeding. You should call your healthcare provider right away if you experience any of the following: ?Bleeding from an injury or your nose that does not stop. ?Unusual colored urine (red or dark brown) or unusual colored stools (red or black). ?Unusual bruising for unknown reasons. ?A serious fall or if you hit your head (even if there is no bleeding). ? ?Some medicines may interact with Eliquis? and might increase your risk of bleeding or clotting while on Eliquis?Marland Kitchen To help avoid this, consult your healthcare provider or pharmacist prior to using any new prescription or non-prescription medications, including herbals, vitamins, non-steroidal anti-inflammatory drugs (NSAIDs) and supplements. ? ?This website has more information on Eliquis? (apixaban): http://www.eliquis.com/eliquis/home  ? ?_____________________________________________________________________________________________________________ ? ?Protonix ?Continue Protonix 40 mg by mouth twice a day through Mar 08, 2022. On Mar 09, 2022 (Wednesday), you will start taking Protonix 40 mg by mouth ONCE DAILY ? ?____________________________________________________________________________________________________ ? ?COMMUNITY REFERRALS UPON DISCHARGE:   ? ?Home Health:   PT   OT   RN    CNA ?  [place] Phone:[phone]  ? ?Medical Equipment/Items Ordered:WHEELCHAIR ?                                                Agency/Supplier:[facility]  [phone] ? ?My questions have  been answered and I understand these instructions. I will adhere to these goals and the provided educational materials after my discharge from the hospital. ? ?Patient/Caregiver Signature _______________________________ Date __________ ? ?Clinician Signature _______________________________________ Date __________ ? ?

## 2022-01-19 NOTE — Progress Notes (Signed)
Occupational Therapy Session Note ? ?Patient Details  ?Name: Holly [name] ?MRN: [id number] ?Date of Birth: 04/29/1946 ? ?Today's Date: 01/19/2022 ?OT Missed Time: 45 Minutes ?Missed Time Reason: Unavailable (comment) (out of room) ? ?Upon entry, pt and pt's bed were out of room, with previous PTA stating pt was having central line placed, with this therapist unable to locate assigned nurse. Pt missed 45 mins of OT intervention due to being taken off unit.    ? ? ?Nena Hampe E Annibelle Brazie ?01/19/2022, 7:48 AM ?

## 2022-01-19 NOTE — Progress Notes (Signed)
Order placed for central line placement with IR due to inability to place PICC ?

## 2022-01-20 DIAGNOSIS — R5381 Other malaise: Secondary | ICD-10-CM | POA: Diagnosis not present

## 2022-01-20 NOTE — Progress Notes (Signed)
Occupational Therapy Session Note ? ?Patient Details  ?Name: Holly [name] ?MRN: [id number] ?Date of Birth: 1945-12-23 ? ?Today's Date: 01/20/2022 ?OT Individual Time: 1275-1700 ?OT Individual Time Calculation (min): 38 min  ? ? ?Short Term Goals: ?Week 1:  OT Short Term Goal 1 (Week 1): The pt will complete UB/LB bathing with with MinA using AE of a long hand sponge as needed ?OT Short Term Goal 2 (Week 1): The pt will complete UB/LB dressing with MinA using AE of a reacher for LE dressing as needed. ?OT Short Term Goal 3 (Week 1): The pt will present with UB strength of 3+/5 MMT for safe and ind task persormance ?OT Short Term Goal 4 (Week 1): The pt will complete toilet transfers with ModI for safe and independent function fot transitioning to home. ?OT Short Term Goal 5 (Week 1): The pt will complete sit to stand with S incorporating RW for ind and safety during functional task completeion. ? ?Skilled Therapeutic Interventions/Progress Updates:  ?Pt greeted supine in bed reporting increased pain in ABD from dressing change, however pt agreeable to OT intervention. Session focus on BADL reeducation, functional mobility, BUE strengthening/ endurance and decreasing overall caregiver burden.        ?Pt completed supine>sit with CGA, pt completed stand pivot to [facility] with RW and MIN A, +urine void. Pt completed 3/3 toileting tasks with MIN A needed assist for clothing mgmt. Pt completed additional stand pivot to w/c post toileting with Rw and MIN A. Pt transported to gym with total A where pt completed 3 mins on scifit propelling scit fit forward and 3 mins propelling Scifit backwards on level 2 to increase overall UB strength and endurance. SpO2 >95% on 3 L [place].  Pt transported back to room with and handed off directly to PT for session.          ?Precautions:  ?Precautions ?Precautions: Fall ?Precaution Comments: colostomy; rt quadrant drain;  recent left tibia fx ?Restrictions ?Weight Bearing Restrictions: Yes ?LLE  Weight Bearing: Weight bearing as tolerated ?Other Position/Activity Restrictions: WBAT for transfers only ? ?Pain: unrated pain in ABD, rest breaks provided as needed.  ? ?Therapy/Group: Individual Therapy ? ?Precious Haws ?01/20/2022, 3:24 PM ?

## 2022-01-20 NOTE — Consult Note (Signed)
[place] Nurse ostomy follow up ?Patient receiving care in [name]. ?Stoma type/location: LUQ colostomy ?Stomal assessment/size: 1.5 inches, round, budded, pink ?Peristomal assessment:intact ?Treatment options for stomal/peristomal skin: barrier ring ?Output: soft brown ?Ostomy pouching: 2pc. 2 and 3/4 inches ?Education provided:  ? ?With verbal instructions, and minimal physical intervention, the patient performed all aspects of pouch burping, emptying, removal, cleaning, new pouch system preparation and application. ? ?I explained to the patient that every time the pouch needs burping, emptying, or changing, she needs to be the one to perform the tasks. Staff here can assist her, but she needs to be the person to perform all.  She understands this and agrees to do. ?Enrolled patient in [place] Discharge program: Yes, previously. ? ?I am requesting additional ostomy supplies for the patient. ? ?Val Riles, RN, MSN, CWOCN, CNS-BC, pager [phone]  ?

## 2022-01-20 NOTE — Progress Notes (Signed)
Occupational Therapy Session Note ? ?Patient Details  ?Name: Holly [name] ?MRN: [id number] ?Date of Birth: 06/04/1946 ? ?Today's Date: 01/20/2022 ?OT Individual Time: 2831-5176 ?OT Individual Time Calculation (min): 60 min  ? ? ?Short Term Goals: ?Week 1:  OT Short Term Goal 1 (Week 1): The pt will complete UB/LB bathing with with MinA using AE of a long hand sponge as needed ?OT Short Term Goal 2 (Week 1): The pt will complete UB/LB dressing with MinA using AE of a reacher for LE dressing as needed. ?OT Short Term Goal 3 (Week 1): The pt will present with UB strength of 3+/5 MMT for safe and ind task persormance ?OT Short Term Goal 4 (Week 1): The pt will complete toilet transfers with ModI for safe and independent function fot transitioning to home. ?OT Short Term Goal 5 (Week 1): The pt will complete sit to stand with S incorporating RW for ind and safety during functional task completeion. ?Week 2:  OT Short Term Goal 1 (Week 2): The pt will complete UB/LB bathing with with MinA using AE of a long hand sponge as needed ?OT Short Term Goal 2 (Week 2): The pt will complete UB/LB dressing with MinA using AE of a reacher for LE dressing as needed. ?OT Short Term Goal 3 (Week 2): The pt will present with UB strength of 3+/5 MMT for safe and ind task persormance ?OT Short Term Goal 4 (Week 2): The pt will complete toilet transfers with ModI for safe and independent function fot transitioning to home. ?OT Short Term Goal 5 (Week 2): The pt will complete sit to stand with S incorporating RW for ind and safety during functional task completeion. ? ?Skilled Therapeutic Interventions/Progress Updates:  ?  Patient in bed upon arrival indicating the she wasn't feeling well , pt completed BADL in bathing at bed LOF , the pt was able to remove her UB clothing items with attention to pic lines and IV ports.  The patient was able to wash UB/LB not inclusive of her bottom with s/u assist, the pt was ModA for her body secondary to  lying in bed.  The pt was able to apply lotion and deodorant with s/u and was able to donn her over head top with s/u assist.  The pt required ModA for donning her brief at bed level , but was able to roll from supine to sidelying to the right and left incorporating the bed rails to assist with CGA.  The pt was able to go up in the bed to improve her positioning using the bed rails with MinA incorporating her feet and lowering the head board.  The pt completed UB exercises using a towel for AROM for shld flexion, horizontal abduction, shld rotation, and large circles to improve improve her level of endurance for functional task performance.  ? ?Therapy Documentation ?Precautions:  ?Precautions ?Precautions: Fall ?Precaution Comments: colostomy; rt quadrant drain;  recent left tibia fx ?Restrictions ?Weight Bearing Restrictions: Yes ?LLE Weight Bearing: Weight bearing as tolerated ?Other Position/Activity Restrictions: WBAT for transfers only ? ?Therapy/Group: Individual Therapy ? ?Holly [name] ?01/20/2022, 12:25 PM ?

## 2022-01-20 NOTE — Progress Notes (Signed)
?                                                       PROGRESS NOTE ? ? ?Subjective/Complaints: ? ?Feels comfortable burping Colostomy, but still working on emptying/ and says still wants ot change some- WOC saw pt- and suggested pt do all colostomy care with staff assistance.  ? ?BP being checked on legs now.  ? ?ROS: ? ?Pt denies SOB, abd pain, CP, N/V/C/D, and vision changes ? ?Objective: ?  ?IR PICC PLACEMENT LEFT >5 YRS INC IMG GUIDE ? 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 EXAM: ULTRASOUND AND FLUOROSCOPIC GUIDED PICC LINE INSERTION MEDICATIONS: 1 cc 1% lidocaine CONTRAST:  None FLUOROSCOPY TIME:  seconds (1 mGy) COMPLICATIONS: None immediate. TECHNIQUE: The procedure, risks, benefits, and alternatives were explained to the patient and informed written consent was obtained. The left upper extremity was prepped with chlorhexidine in a sterile fashion, and a sterile drape was applied covering the operative field. Maximum barrier sterile technique with sterile gowns and gloves were used for the procedure. A timeout was performed prior to the initiation of the procedure. Local anesthesia was provided with 1% lidocaine. After the overlying soft tissues were anesthetized with 1% lidocaine, a micropuncture kit was utilized to access the left brachial vein. Real-time ultrasound guidance was utilized for vascular access including the acquisition of a permanent ultrasound image documenting patency of the accessed vessel. A guidewire was advanced to the level of the superior caval-atrial junction for measurement purposes and the PICC line was cut to length. A peel-away sheath was placed and a 39 cm, 5 [place], dual lumen was inserted to level of the superior caval-atrial junction. A post procedure spot fluoroscopic was obtained. The catheter easily aspirated and flushed and was secured in place. A dressing was placed. The patient tolerated the procedure well  without immediate post procedural complication. FINDINGS: After catheter placement, the tip lies within the superior cavoatrial junction. The catheter aspirates and flushes normally and is ready for immediate use. IMPRESSION: Successful ultrasound and fluoroscopic guided placement of a left brachial vein approach, 39 cm, 5 [place], dual lumen PICC with tip at the superior caval-atrial junction. The PICC line is ready for immediate use. Read by: Narda Rutherford, AGNP-BC Electronically Signed   By: Corrie Mckusick D.O.   On: 01/19/2022 15:38  ? ?[place] EKG SITE RITE ? ?Result Date: 01/18/2022 ?If [employer] not attached, placement could not be confirmed due to current cardiac rhythm.  ?Recent Labs  ?  01/19/22 ?7017  ?WBC 7.9  ?HGB 9.0*  ?HCT 30.3*  ?PLT 264  ? ?Recent Labs  ?  01/19/22 ?7939  ?NA 141  ?K 3.5  ?CL 98  ?CO2 39*  ?GLUCOSE 92  ?BUN 12  ?CREATININE 0.68  ?CALCIUM 9.6  ? ? ?Intake/Output Summary (Last 24 hours) at 01/20/2022 0955 ?Last data filed at 01/20/2022 0300 ?Gross per 24 hour  ?Intake 657 ml  ?Output 450 ml  ?Net 207 ml  ?  ? ?Pressure Injury 01/06/22 Sacrum Deep Tissue Pressure Injury - Purple or maroon localized area of discolored intact skin or blood-filled blister due to damage of underlying soft tissue from pressure and/or shear. (Active)  ?01/06/22 1603  ?Location: Sacrum  ?Location Orientation:   ?Staging: Deep Tissue Pressure  Injury - Purple or maroon localized area of discolored intact skin or blood-filled blister due to damage of underlying soft tissue from pressure and/or shear.  ?Wound Description (Comments):   ?Present on Admission: Yes  ? ? ?Physical Exam: ?Vital Signs ?Blood pressure (!) 149/67, pulse 65, temperature 98.6 ?F (37 ?C), temperature source Oral, resp. rate 18, height 5' (1.524 m), weight 64.7 kg, SpO2 98 %. ? ? ? ? ? ?General: awake, alert, appropriate, wearing CPAP- took off; supine in bed; NAD ?HENT: conjugate gaze; oropharynx moist ?CV: regular rate; no JVD ?Pulmonary: CTA B/L;  no W/R/R- good air movement ?GI: soft, NT, ND, (+)BS- colostomy needs ot be burped- <25% full of dark brown stool ?Psychiatric: appropriate ?Neurological: alert ?Skin:tunneled catheter in LUE- looks OK ?  General: Skin is warm and dry.  ?   Comments: Midline incision and left lower extremity incisions healing without signs of infection  ?Neurological:  ?   Mental Status: She is alert and oriented to person, place, and time.  ?   Motor: Weakness present.  ?   Comments: Weak dorsiflexion of right foot  ? ?Assessment/Plan: ?1. Functional deficits which require 3+ hours per day of interdisciplinary therapy in a comprehensive inpatient rehab setting. ?Physiatrist is providing close team supervision and 24 hour management of active medical problems listed below. ?Physiatrist and rehab team continue to assess barriers to discharge/monitor patient progress toward functional and medical goals ? ?Care Tool: ? ?Bathing ?   ?Body parts bathed by patient: Right arm, Left arm, Chest, Abdomen, Front perineal area, Buttocks, Face, Right upper leg, Left upper leg  ? Body parts bathed by helper: Right lower leg, Left lower leg ?  ?  ?Bathing assist Assist Level: Minimal Assistance - Patient > 75% ?  ?  ?Upper Body Dressing/Undressing ?Upper body dressing   ?What is the patient wearing?: Dress ?   ?Upper body assist Assist Level: Supervision/Verbal cueing ?   ?Lower Body Dressing/Undressing ?Lower body dressing ? ? ?   ?What is the patient wearing?: Incontinence brief ? ?  ? ?Lower body assist Assist for lower body dressing: Minimal Assistance - Patient > 75% ?   ? ?Toileting ?Toileting    ?Toileting assist Assist for toileting: Moderate Assistance - Patient 50 - 74% ?  ?  ?Transfers ?Chair/bed transfer ? ?Transfers assist ?   ? ?Chair/bed transfer assist level: Supervision/Verbal cueing ?  ?  ?Locomotion ?Ambulation ? ? ?Ambulation assist ? ? Ambulation activity did not occur: Safety/medical concerns ? ?  ?  ?   ? ?Walk 10 feet  activity ? ? ?Assist ? Walk 10 feet activity did not occur: Safety/medical concerns ? ?  ?   ? ?Walk 50 feet activity ? ? ?Assist Walk 50 feet with 2 turns activity did not occur: Safety/medical concerns ? ?  ?   ? ? ?Walk 150 feet activity ? ? ?Assist Walk 150 feet activity did not occur: Safety/medical concerns ? ?  ?  ?  ? ?Walk 10 feet on uneven surface  ?activity ? ? ?Assist Walk 10 feet on uneven surfaces activity did not occur: Safety/medical concerns ? ? ?  ?   ? ?Wheelchair ? ? ? ? ?Assist Is the patient using a wheelchair?: Yes ?Type of Wheelchair: Manual ?  ? ?Wheelchair assist level: Dependent - Patient 0% (limited by pain) ?   ? ? ?Wheelchair 50 feet with 2 turns activity ? ? ? ?Assist ? ?  ?  ? ? ?Assist Level:  Dependent - Patient 0%  ? ?Wheelchair 150 feet activity  ? ? ? ?Assist ?   ? ? ?Assist Level: Dependent - Patient 0%  ? ?Blood pressure (!) 149/67, pulse 65, temperature 98.6 ?F (37 ?C), temperature source Oral, resp. rate 18, height 5' (1.524 m), weight 64.7 kg, SpO2 98 %. ? ?Medical Problem List and Plan: ?1. Functional deficits secondary to perforated diverticulum, exploratory laparotomy with colostomy, recent tibial plateau fracture repair. ?            -patient may shower but colostomy must be covered ?            -ELOS/Goals: MinA/S 12-16 days     ?            d/c  4/10-  ? Continue CIR- PT, OT  ?2.  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            -antiplatelet therapy: none ?3. Pain: continue Tylenol, oxycodone PRN ?            --Robaxin 500 mg q 6 hours ? 4/6- pain controlled- con't regimen ?4. Mood: LCSW to evaluate and provide emotional support ?            -continue Wellbutrin XL 300 mg q AM ?            -continue Buspar 5 mg BID ?            --continue Xanax 0.5 mg prn anxiety ?            -antipsychotic agents: n/a ?5. Neuropsych: This patient is capable of making decisions on her own behalf. ?6.  Skin/Wound Care: Routine skin care checks ?            -- Monitor midline incision, LLE incisions ?            -- Routine stoma care ?            --Monitor RUE bullous lesions ? 3/31- staples not ready to c

## 2022-01-20 NOTE — Progress Notes (Signed)
Physical Therapy Session Note ? ?Patient Details  ?Name: Holly [name] ?MRN: [id number] ?Date of Birth: [birth year] ? ?Today's Date: 01/20/2022 ?PT Individual Time: 1165-7903 ?PT Individual Time Calculation (min): 41 min   ?and  ?Today's Date: 01/20/2022 ?PT Missed Time: 34 Minutes ?Missed Time Reason: Patient fatigue ? ?Short Term Goals: ?Week 1:  PT Short Term Goal 1 (Week 1): Patient will perform basic transfers with CGA. ?PT Short Term Goal 2 (Week 1): Patient will propel w/c >50 ft with supervision. ? ?Skilled Therapeutic Interventions/Progress Updates:  ?  Pt received sitting in w/c as hand-off from OT. Despite reporting fatigue, pt agreeable to continue with this therapy session.  Transported to/from gym in w/c for time management and energy conservation.  ? ?Pt able to recall L LE WBing precautions without cuing. L stand pivot w/c>EOM using RW with CGA for safety. ? ?Pt reports not ambulating at home due to fear of falling. ? ?Performed seated EOM exercises as follows: ?- L LE long arc quads with 5 sec isometric hold 2x 15 reps  ?- R LE long arc quads with 5 sec isometric hold and 4lb ankle weight 2x15 reps ? ?Sit>supine with min assist to bring RLE onto mat due to wearing ankle weight. ? ?Supine B LE heel slides 2x 8 reps each with pt reporting this exercise as more challenging and also feeling a slight "pull" in L hip/lower abdomen region near the colostomy site but not significant. ? ?Pt reporting significant fatigue and requesting to return to her room and lie down. Therapist attempts to encourage her for continued participation; however, pt politely declines. Supine>sitting R EOM via logroll technique with max step-by-step cuing for sequencing and min assist for trunk upright.  ? ?R stand pivot back to w/c using RW with CGA for safety - no instability noted and pt with good sequencing of transfer without cuing.  ? ?Transported back to room. R stand pivot to EOB using RW as just described. Sit>supine using  bedrail with supervision. ? ?Pt educated on importance of not lying in supine with LEs elevated for >1 hour as that results in increased pressure to sacral region. Therapist requested for the nurse to assist pt into sidelying after they were finished with colostomy and incision care. Pt left in bed with needs in reach and bedrails up per pt request. Missed 34 minutes of skilled physical therapy. ? ? ? ?Therapy Documentation ?Precautions:  ?Precautions ?Precautions: Fall ?Precaution Comments: colostomy; rt quadrant drain;  recent left tibia fx ?Restrictions ?Weight Bearing Restrictions: Yes ?LLE Weight Bearing: Weight bearing as tolerated ?Other Position/Activity Restrictions: WBAT for transfers only ? ? ?Pain: ? No specific complaints of pain. ? ? ?Therapy/Group: Individual Therapy ? ?Tawana Scale , PT, DPT, NCS, CSRS ?01/20/2022, 12:39 PM  ?

## 2022-01-20 NOTE — Progress Notes (Signed)
Patient using home CPAP machine independently. ?

## 2022-01-21 ENCOUNTER — Telehealth: Payer: Self-pay | Admitting: *Deleted

## 2022-01-21 DIAGNOSIS — R5381 Other malaise: Secondary | ICD-10-CM | POA: Diagnosis not present

## 2022-01-21 MED ORDER — SODIUM CHLORIDE 0.9% FLUSH: 10.0000 mL | INTRAVENOUS | Status: DC | PRN

## 2022-01-21 NOTE — Progress Notes (Signed)
Patient has a home CPAP that she says she can place on herself when ready. RT instructed her to call if she needed assistance. ?

## 2022-01-21 NOTE — Progress Notes (Signed)
Physical Therapy Discharge Summary ? ?Patient Details  ?Name: Holly [name] ?MRN: [id number] ?Date of Birth: 1945/11/24 ? ?Patient has met 8 of 8 long term goals due to improved activity tolerance, improved balance, improved postural control, increased strength, decreased pain, and ability to compensate for deficits.  Patient to discharge at a wheelchair level Supervision.   Patient's care partner is independent to provide the necessary physical assistance at discharge. Pt's daughter has completed hands-on family education. ? ?Reasons goals not met: Pt has met all rehab goals. ? ?Recommendation:  ?Patient will benefit from ongoing skilled PT services in home health setting to continue to advance safe functional mobility, address ongoing impairments in endurance, transfers, balance, and minimize fall risk. ? ?Equipment: ?18x18 wc ? ?Reasons for discharge: treatment goals met and discharge from hospital ? ?Patient/family agrees with progress made and goals achieved: Yes ? ?PT Discharge ?Precautions/Restrictions ?Precautions ?Precautions: Fall ?Precaution Comments: colostomy; recent left tibia fx ?Restrictions ?Weight Bearing Restrictions: Yes ?LLE Weight Bearing: Weight bearing as tolerated ?Other Position/Activity Restrictions: WBAT for transfers only ?Pain Interference ?Pain Interference ?Pain Effect on Sleep: 2. Occasionally ?Pain Interference with Therapy Activities: 2. Occasionally ?Pain Interference with Day-to-Day Activities: 2. Occasionally ?Vision/Perception  ?Perception ?Perception: Within Functional Limits ?Praxis ?Praxis: Intact  ?Cognition ?Overall Cognitive Status: Within Functional Limits for tasks assessed ?Arousal/Alertness: Awake/alert ?Orientation Level: Oriented X4 ?Attention: Focused ?Focused Attention: Appears intact ?Memory: Appears intact ?Awareness: Appears intact ?Problem Solving: Appears intact ?Safety/Judgment: Appears intact ?Sensation ?Sensation ?Light Touch: Appears Intact ?Proprioception:  Appears Intact ?Coordination ?Gross Motor Movements are Fluid and Coordinated: No ?Fine Motor Movements are Fluid and Coordinated: Yes ?Coordination and Movement Description: generalized deconditioning, decreased postural control, limited by weight bearing precautions for transfers only ?Motor  ?Motor ?Motor: Abnormal postural alignment and control ?Motor - Skilled Clinical Observations: generalized decondition, L tibial plateau fx ?Motor - Discharge Observations: generalized weakness  ?Mobility ?Bed Mobility ?Bed Mobility: Rolling Right;Rolling Left;Supine to Sit;Sit to Supine ?Rolling Right: Supervision/verbal cueing ?Rolling Left: Supervision/Verbal cueing ?Supine to Sit: Supervision/Verbal cueing ?Sit to Supine: Supervision/Verbal cueing ?Transfers ?Transfers: Sit to Stand;Stand to [employer] Transfers ?Sit to Stand: Supervision/Verbal cueing ?Stand to Sit: Supervision/Verbal cueing ?Stand Pivot Transfers: Supervision/Verbal cueing ?Stand Pivot Transfer Details: Verbal cues for technique;Verbal cues for precautions/safety;Verbal cues for safe use of DME/AE ?Transfer (Assistive device): Rolling walker ?Locomotion  ?Gait ?Ambulation: No ?Gait ?Gait: No ?Stairs / Additional Locomotion ?Stairs: No ?Pick up small object from the floor (from standing position) activity did not occur: Safety/medical concerns ?Wheelchair Mobility ?Wheelchair Mobility: Yes ?Wheelchair Assistance: Independent with assistive device ?Wheelchair Propulsion: Both upper extremities ?Wheelchair Parts Management: Needs assistance ?Distance: 75  ?Trunk/Postural Assessment  ?Cervical Assessment ?Cervical Assessment: Exceptions to [facility] (forward head) ?Thoracic Assessment ?Thoracic Assessment: Exceptions to [facility] (rounded shoulders) ?Lumbar Assessment ?Lumbar Assessment: Exceptions to [facility] (posterior pelvic tilt) ?Postural Control ?Postural Control: Deficits on evaluation (decreased/delayed)  ?Balance ?Balance ?Balance Assessed: Yes ?Static Sitting  Balance ?Static Sitting - Balance Support: No upper extremity supported;Feet supported ?Static Sitting - Level of Assistance: 7: Independent ?Dynamic Sitting Balance ?Dynamic Sitting - Balance Support: No upper extremity supported;Feet supported;During functional activity ?Dynamic Sitting - Level of Assistance: 6: Modified independent (Device/Increase time) ?Static Standing Balance ?Static Standing - Balance Support: Bilateral upper extremity supported;During functional activity ?Static Standing - Level of Assistance: 5: Stand by assistance ?Dynamic Standing Balance ?Dynamic Standing - Balance Support: Bilateral upper extremity supported;During functional activity ?Dynamic Standing - Level of Assistance: 5: Stand by assistance ?Extremity Assessment   ?RLE Assessment ?RLE  Assessment: Exceptions to [facility] ?Active Range of Motion (AROM) Comments: Limited hip flexion due to abdomnal pain, trace anterior tib activation ?RLE Strength ?RLE Overall Strength: Deficits ?Right Hip Flexion: 3+/5 ?Right Knee Flexion: 3+/5 ?Right Knee Extension: 3+/5 ?Right Ankle Dorsiflexion: 1/5 ?Right Ankle Plantar Flexion: 3+/5 ?LLE Assessment ?LLE Assessment: Exceptions to [facility] ?Active Range of Motion (AROM) Comments: hip flexion limited by abdominal pain, knee flexion limited at 90 deg ?LLE Strength ?LLE Overall Strength: Deficits ?Left Hip Flexion: 3/5 ?Left Knee Flexion: 3+/5 ?Left Knee Extension: 3+/5 ?Left Ankle Dorsiflexion: 3+/5 ?Left Ankle Plantar Flexion: 3+/5 ? ? ? ? ?Holly [name], PT, DPT, CSRS ?Debbora Dus, PT, DPT, CBIS ? ?01/23/2022, 10:56 AM ?

## 2022-01-21 NOTE — Progress Notes (Signed)
?                                                       PROGRESS NOTE ? ? ?Subjective/Complaints: ? ?Pt reports doing well- no issues- colostomy working well.  ? ?ROS: ? ?Pt denies SOB, abd pain, CP, N/V/C/D, and vision changes ? ?Objective: ?  ?IR PICC PLACEMENT LEFT >5 YRS INC IMG GUIDE ? 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 EXAM: ULTRASOUND AND FLUOROSCOPIC GUIDED PICC LINE INSERTION MEDICATIONS: 1 cc 1% lidocaine CONTRAST:  None FLUOROSCOPY TIME:  seconds (1 mGy) COMPLICATIONS: None immediate. TECHNIQUE: The procedure, risks, benefits, and alternatives were explained to the patient and informed written consent was obtained. The left upper extremity was prepped with chlorhexidine in a sterile fashion, and a sterile drape was applied covering the operative field. Maximum barrier sterile technique with sterile gowns and gloves were used for the procedure. A timeout was performed prior to the initiation of the procedure. Local anesthesia was provided with 1% lidocaine. After the overlying soft tissues were anesthetized with 1% lidocaine, a micropuncture kit was utilized to access the left brachial vein. Real-time ultrasound guidance was utilized for vascular access including the acquisition of a permanent ultrasound image documenting patency of the accessed vessel. A guidewire was advanced to the level of the superior caval-atrial junction for measurement purposes and the PICC line was cut to length. A peel-away sheath was placed and a 39 cm, 5 [place], dual lumen was inserted to level of the superior caval-atrial junction. A post procedure spot fluoroscopic was obtained. The catheter easily aspirated and flushed and was secured in place. A dressing was placed. The patient tolerated the procedure well without immediate post procedural complication. FINDINGS: After catheter placement, the tip lies within the superior cavoatrial junction. The catheter  aspirates and flushes normally and is ready for immediate use. IMPRESSION: Successful ultrasound and fluoroscopic guided placement of a left brachial vein approach, 39 cm, 5 French, dual lumen PICC with tip at the superior caval-atrial junction. The PICC line is ready for immediate use. Read by: Narda Rutherford, AGNP-BC Electronically Signed   By: Corrie Mckusick D.O.   On: 01/19/2022 15:38   ?Recent Labs  ?  01/19/22 ?3845  ?WBC 7.9  ?HGB 9.0*  ?HCT 30.3*  ?PLT 264  ? ?Recent Labs  ?  01/19/22 ?3646  ?NA 141  ?K 3.5  ?CL 98  ?CO2 39*  ?GLUCOSE 92  ?BUN 12  ?CREATININE 0.68  ?CALCIUM 9.6  ? ? ?Intake/Output Summary (Last 24 hours) at 01/21/2022 0902 ?Last data filed at 01/21/2022 8032 ?Gross per 24 hour  ?Intake 10 ml  ?Output --  ?Net 10 ml  ?  ? ?Pressure Injury 01/06/22 Sacrum Deep Tissue Pressure Injury - Purple or maroon localized area of discolored intact skin or blood-filled blister due to damage of underlying soft tissue from pressure and/or shear. (Active)  ?01/06/22 1603  ?Location: Sacrum  ?Location Orientation:   ?Staging: Deep Tissue Pressure Injury - Purple or maroon localized area of discolored intact skin or blood-filled blister due to damage of underlying soft tissue from pressure and/or shear.  ?Wound Description (Comments):   ?Present on Admission: Yes  ? ? ?Physical Exam: ?Vital Signs ?Blood pressure 129/66, pulse 72, temperature (!) 97.5 ?F (36.4 ?C), temperature  source Oral, resp. rate 18, height 5' (1.524 m), weight 64.7 kg, SpO2 98 %. ? ? ? ? ? ? ?General: awake, alert, appropriate, on CPAP in bed; woke for me; NAD ?HENT: conjugate gaze; oropharynx moist ?CV: regular rate; no JVD ?Pulmonary: CTA B/L; no W/R/R- decreased at bases- wearing O2 by [place] ?GI: soft, NT, ND, (+)BS- colostomy- sutures out ?Psychiatric: appropriate ?Neurological: Ox3 ?Skin:tunneled catheter in LUE- looks OK ?  General: Skin is warm and dry.  ?   Comments: Midline incision and left lower extremity incisions healing without signs of  infection  ?Neurological:  ?   Mental Status: She is alert and oriented to person, place, and time.  ?   Motor: Weakness present.  ?   Comments: Weak dorsiflexion of right foot  ? ?Assessment/Plan: ?1. Functional deficits which require 3+ hours per day of interdisciplinary therapy in a comprehensive inpatient rehab setting. ?Physiatrist is providing close team supervision and 24 hour management of active medical problems listed below. ?Physiatrist and rehab team continue to assess barriers to discharge/monitor patient progress toward functional and medical goals ? ?Care Tool: ? ?Bathing ?   ?Body parts bathed by patient: Right arm, Left arm, Chest, Abdomen, Front perineal area, Buttocks, Face, Right upper leg, Left upper leg  ? Body parts bathed by helper: Right lower leg, Left lower leg ?  ?  ?Bathing assist Assist Level: Minimal Assistance - Patient > 75% ?  ?  ?Upper Body Dressing/Undressing ?Upper body dressing   ?What is the patient wearing?: Dress ?   ?Upper body assist Assist Level: Supervision/Verbal cueing ?   ?Lower Body Dressing/Undressing ?Lower body dressing ? ? ?   ?What is the patient wearing?: Incontinence brief ? ?  ? ?Lower body assist Assist for lower body dressing: Minimal Assistance - Patient > 75% ?   ? ?Toileting ?Toileting    ?Toileting assist Assist for toileting: Minimal Assistance - Patient > 75% ?  ?  ?Transfers ?Chair/bed transfer ? ?Transfers assist ?   ? ?Chair/bed transfer assist level: Minimal Assistance - Patient > 75% ?  ?  ?Locomotion ?Ambulation ? ? ?Ambulation assist ? ? Ambulation activity did not occur: Safety/medical concerns ? ?  ?  ?   ? ?Walk 10 feet activity ? ? ?Assist ? Walk 10 feet activity did not occur: Safety/medical concerns ? ?  ?   ? ?Walk 50 feet activity ? ? ?Assist Walk 50 feet with 2 turns activity did not occur: Safety/medical concerns ? ?  ?   ? ? ?Walk 150 feet activity ? ? ?Assist Walk 150 feet activity did not occur: Safety/medical concerns ? ?  ?  ?   ? ?Walk 10 feet on uneven surface  ?activity ? ? ?Assist Walk 10 feet on uneven surfaces activity did not occur: Safety/medical concerns ? ? ?  ?   ? ?Wheelchair ? ? ? ? ?Assist Is the patient using a wheelchair?: Yes ?Type of Wheelchair: Manual ?  ? ?Wheelchair assist level: Dependent - Patient 0% (limited by pain) ?   ? ? ?Wheelchair 50 feet with 2 turns activity ? ? ? ?Assist ? ?  ?  ? ? ?Assist Level: Dependent - Patient 0%  ? ?Wheelchair 150 feet activity  ? ? ? ?Assist ?   ? ? ?Assist Level: Dependent - Patient 0%  ? ?Blood pressure 129/66, pulse 72, temperature (!) 97.5 ?F (36.4 ?C), temperature source Oral, resp. rate 18, height 5' (1.524 m), weight 64.7 kg, SpO2 98 %. ? ?  Medical Problem List and Plan: ?1. Functional deficits secondary to perforated diverticulum, exploratory laparotomy with colostomy, recent tibial plateau fracture repair. ?            -patient may shower but colostomy must be covered ?            -ELOS/Goals: MinA/S 12-16 days     ?            d/c  4/10-  ? Continue CIR- PT, OT - d/c Monday ?2.  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            -antiplatelet therapy: none ?3. Pain: continue Tylenol, oxycodone PRN ?            --Robaxin 500 mg q 6 hours ? 4/7- pain controlled- con't regimen ?4. Mood: LCSW to evaluate and provide emotional support ?            -continue Wellbutrin XL 300 mg q AM ?            -continue Buspar 5 mg BID ?            --continue Xanax 0.5 mg prn anxiety ?            -antipsychotic agents: n/a ?5. Neuropsych: This patient is capable of making decisions on her own behalf. ?6. Skin/Wound Care: Routine skin care checks ?            -- Monitor midline incision, LLE incisions ?            -- Routine stoma care ?            --Monitor RUE bullous lesions ? 3/31- staples not ready to come out- surgery was 3/21- likely d/c next week  ? 4/4- will find out from surgeon when can remove  staples- is go by days, they're due to be removed.  ? 4/6- have my chart secure chatted PA form surgery to see if can remove staples- has been 16 days.  ? 4/7- ordered for staples ot be removed ?7. Fluids/Electro

## 2022-01-21 NOTE — Progress Notes (Signed)
Occupational Therapy Weekly Progress Note ? ?Patient Details  ?Name: Holly [name] ?MRN: [id number] ?Date of Birth: 11-26-1945 ? ?Beginning of progress report period: January 13, 2022 ?End of progress report period: January 21, 2022 ? ?Today's Date: 01/21/2022 ?OT Missed Time: 30 Minutes ?Missed Time Reason: Pain ? ? ?Patient has met 4 of 5 short term goals.  Pt is progressing with her ability to rise to stand and transfer with RW with close S.  She does need CGA to supervision with most of her ADLs.  Pt has been limited with participation due to pain from abdominal wound/general abdominal pain. ? ?Patient continues to demonstrate the following deficits: muscle weakness and decreased cardiorespiratoy endurance and decreased oxygen support and therefore will continue to benefit from skilled OT intervention to enhance overall performance with BADL. ? ?Patient progressing toward long term goals..  Continue plan of care. ? ?OT Short Term Goals ?Week 1:  OT Short Term Goal 1 (Week 1): The pt will complete UB/LB bathing with with MinA using AE of a long hand sponge as needed ?OT Short Term Goal 1 - Progress (Week 1): Met ?OT Short Term Goal 2 (Week 1): The pt will complete UB/LB dressing with MinA using AE of a reacher for LE dressing as needed. ?OT Short Term Goal 2 - Progress (Week 1): Met ?OT Short Term Goal 3 (Week 1): The pt will present with UB strength of 3+/5 MMT for safe and ind task persormance ?OT Short Term Goal 3 - Progress (Week 1): Met ?OT Short Term Goal 4 (Week 1): The pt will complete toilet transfers with ModI for safe and independent function fot transitioning to home. ?OT Short Term Goal 4 - Progress (Week 1): Progressing toward goal ?OT Short Term Goal 5 (Week 1): The pt will complete sit to stand with S incorporating RW for ind and safety during functional task completeion. ?OT Short Term Goal 5 - Progress (Week 1): Met ?Week 2:  OT Short Term Goal 1 (Week 2): STGs = LTGs ?OT Short Term Goal 2 (Week 2): STGs  = LTGs ?OT Short Term Goal 3 (Week 2): STGs = LTGs ?OT Short Term Goal 4 (Week 2): STGs = LTGs ?OT Short Term Goal 5 (Week 2): STGs = LTGs ? ?Skilled Therapeutic Interventions/Progress Updates:  ?  Pt received in bed stating she had to cancel all of her morning therapies due to pain . She stated she is still in 9/10 pain and does not want to get out of bed.  Pt usually participated well, so she is in considerable discomfort if she declines therapy.  ? ?Therapy Documentation ?Precautions:  ?Precautions ?Precautions: Fall ?Precaution Comments: colostomy; recent left tibia fx ?Restrictions ?Weight Bearing Restrictions: Yes ?LLE Weight Bearing: Weight bearing as tolerated ?Other Position/Activity Restrictions: WBAT for transfers only ?General: ?General ?PT Missed Treatment Reason: Pain;Patient unwilling to participate ?Vital Signs: ?Therapy Vitals ?Pulse Rate: 72 ?Resp: 18 ?Patient Position (if appropriate): Lying ?Oxygen Therapy ?SpO2: 98 % ?O2 Device: Nasal Cannula ?O2 Flow Rate (L/min): 3 L/min ? ?Pain: 9/10 in abdomen - reported to RN ?  ?ADL: ?ADL ?Eating: Independent ?Where Assessed-Eating:  (based on functional task completion.) ?Grooming: Independent ?Where Assessed-Grooming: Sitting at sink ?Upper Body Bathing: Setup ?Where Assessed-Upper Body Bathing: Sitting at sink ?Lower Body Bathing: Contact guard ?Where Assessed-Lower Body Bathing: Sitting at sink ?Upper Body Dressing: Setup ?Where Assessed-Upper Body Dressing: Edge of bed ?Lower Body Dressing: Minimal assistance ?Where Assessed-Lower Body Dressing: Edge of bed ?Toileting: Contact guard ?Where  Assessed-Toileting: Bedside Commode ?Toilet Transfer: Close supervision ?Toilet Transfer Method: Stand pivot ?Toilet Transfer Equipment: Bedside commode ?Tub/Shower Transfer: Not assessed (pt has an abdominal wound and cannot shower at this time) ?Tub/Shower Transfer Method:  (Additional assessing needed) ?Walk-In Shower Transfer:  (more assessing needed) ?Walk-In  Shower Transfer Method: Stand pivot (patient demonstrates the ability to complete stand pivot based on her performance at EOB) ?Walk-In Shower Equipment:  (Patient has a walk in shower with a shower chair to assist with performance.) ? ? ?Therapy/Group: Individual Therapy ? ?[place] ?01/21/2022, 10:26 AM  ?

## 2022-01-21 NOTE — Progress Notes (Signed)
Occupational Therapy Note ? ?Patient Details  ?Name: Holly [name] ?MRN: [id number] ?Date of Birth: [birth year] ? ?Today's Date: 01/21/2022 ?OT Missed Time: 60 Minutes ?Missed Time Reason: Pain ? ?Pt missed 60 mins of group session d/t ABD pain, will check back as time allows to make up for missed minutes.   ? ?Precious Haws ?01/21/2022, 1:14 PM ?

## 2022-01-21 NOTE — Progress Notes (Addendum)
Physical Therapy Weekly Progress Note ? ?Patient Details  ?Name: Holly [name] ?MRN: [id number] ?Date of Birth: 10/13/1946 ? ?Beginning of progress report period: January 13, 2022 ?End of progress report period: January 21, 2022 ? ?Today's Date: 01/21/2022 ?PT Missed Time: 45 Minutes ?Missed Time Reason: Pain;Patient unwilling to participate ? ?Patient has met 2 of 2 short term goals.  Patient is making good progress toward her LTG. She is near or at baseline prior to her admission. She requires supervision for stand pivot transfers to/from wc using RW. She remains limited by pain in her lower abdomen and intermittent nausea.  ? ?Patient continues to demonstrate the following deficits muscle weakness, decreased cardiorespiratoy endurance and decreased oxygen support, and decreased sitting balance, decreased standing balance, decreased postural control, and decreased balance strategies and therefore will continue to benefit from skilled PT intervention to increase functional independence with mobility. ? ?Patient progressing toward long term goals..  Continue plan of care. ? ?PT Short Term Goals ?Week 1:  PT Short Term Goal 1 (Week 1): Patient will perform basic transfers with CGA. ?PT Short Term Goal 1 - Progress (Week 1): Met ?PT Short Term Goal 2 (Week 1): Patient will propel w/c >50 ft with supervision. ?PT Short Term Goal 2 - Progress (Week 1): Met ?Week 2:  PT Short Term Goal 1 (Week 2): STG=LTG based on ELOS ? ?Skilled Therapeutic Interventions/Progress Updates:  ?Nurse, mental health reintegration;Discharge planning;Disease management/prevention;Functional electrical stimulation;DME/adaptive equipment instruction;Functional mobility training;Neuromuscular re-education;Pain management;Patient/family education;Psychosocial support;Skin care/wound management;Splinting/orthotics;Therapeutic Activities;Therapeutic Exercise;UE/LE Strength taining/ROM;UE/LE Coordination activities;Wheelchair  propulsion/positioning Patient received supine in bed, reporting significant pain in her lower left abdomen that radiates to her R lower abdomen and around her low back. Patient declining to participate in therapy at this time due to pain. RN aware of pain- per patient, due to pain rx in short while. All needs within reach.  ? ?Therapy Documentation ?Precautions:  ?Precautions ?Precautions: Fall ?Precaution Comments: colostomy; recent left tibia fx ?Restrictions ?Weight Bearing Restrictions: Yes ?LLE Weight Bearing: Weight bearing as tolerated ?Other Position/Activity Restrictions: WBAT for transfers only ? ? ?Therapy/Group: Individual Therapy ? ?Debbora Dus ?Debbora Dus, PT, DPT, CBIS ? ?01/21/2022, 10:35 AM  ?

## 2022-01-21 NOTE — Progress Notes (Signed)
Occupational Therapy Discharge Summary ? ?Patient Details  ?Name: Holly [name] ?MRN: [id number] ?Date of Birth: [birth year] ? ?Patient has met 3 of 8 long term goals due to improved activity tolerance, improved balance, and ability to compensate for deficits.  Patient to discharge at overall Supervision level.  Patient's care partner is independent to provide the necessary physical assistance at discharge.   ? ?Family education with daughter completed.  ? ?Reasons goals not met: based on pt's description of her PLOF and what she needs to do at home, pt's goals set at Mod I level.  Just prior to discharge, her daughter shared with therapy that she has been helping her mom a lot at home.  Pt will need Supervision due to her low activity tolerance.  ? ?Recommendation:  ?Patient will benefit from ongoing skilled OT services in home health setting to continue to advance functional skills in the area of BADL and iADL. ? ?Equipment: ?No equipment provided -pt has all needed DME ? ?Reasons for discharge: treatment goals met ? ?Patient/family agrees with progress made and goals achieved: Yes ? ?OT Discharge ?Precautions/Restrictions  ?Precautions ?Precautions: Fall ?Precaution Comments: colostomy; recent left tibia fx ?Restrictions ?Weight Bearing Restrictions: Yes ?LLE Weight Bearing: Weight bearing as tolerated ?Other Position/Activity Restrictions: WBAT for transfers only ?ADL ?ADL ?Eating: Independent ?Where Assessed-Eating:  (based on functional task completion.) ?Grooming: Independent ?Where Assessed-Grooming: Sitting at sink ?Upper Body Bathing: Setup ?Where Assessed-Upper Body Bathing: Sitting at sink ?Lower Body Bathing: Contact guard ?Where Assessed-Lower Body Bathing: Sitting at sink ?Upper Body Dressing: Setup ?Where Assessed-Upper Body Dressing: Edge of bed ?Lower Body Dressing: Contact guard ?Where Assessed-Lower Body Dressing: Edge of bed ?Toileting: Supervision/safety ?Where Assessed-Toileting: Bedside  Commode ?Toilet Transfer: Close supervision ?Toilet Transfer Method: Stand pivot ?Toilet Transfer Equipment: Bedside commode ?Tub/Shower Transfer: Not assessed (pt has an abdominal wound and cannot shower at this time) ?Tub/Shower Transfer Method:  (Additional assessing needed) ?Walk-In Shower Transfer:  (more assessing needed) ?Walk-In Shower Transfer Method: Stand pivot (patient demonstrates the ability to complete stand pivot based on her performance at EOB) ?Walk-In Shower Equipment:  (Patient has a walk in shower with a shower chair to assist with performance.) ?Vision ?Baseline Vision/History: 0 No visual deficits ?Patient Visual Report: No change from baseline ?Vision Assessment?: No apparent visual deficits ?Perception  ?Perception: Within Functional Limits ?Praxis ?Praxis: Intact ?Cognition ?Cognition ?Overall Cognitive Status: Within Functional Limits for tasks assessed ?Arousal/Alertness: Awake/alert ?Orientation Level: Person;Place;Situation ?Person: Oriented ?Place: Oriented ?Situation: Oriented ?Memory: Appears intact ?Focused Attention: Appears intact ?Awareness: Appears intact ?Problem Solving: Appears intact ?Safety/Judgment: Appears intact ?Brief Interview for Mental Status (BIMS) ?Repetition of Three Words (First Attempt): 3 ?Temporal Orientation: Year: Correct ?Temporal Orientation: Month: Accurate within 5 days ?Temporal Orientation: Day: Correct ?Recall: "Sock": Yes, no cue required ?Recall: "Blue": Yes, no cue required ?Recall: "Bed": Yes, no cue required ?BIMS Summary Score: 15 ?Sensation ?Sensation ?Light Touch: Appears Intact ?Hot/Cold: Appears Intact ?Proprioception: Appears Intact ?Stereognosis: Appears Intact ?Coordination ?Gross Motor Movements are Fluid and Coordinated: No ?Fine Motor Movements are Fluid and Coordinated: Yes ?Coordination and Movement Description: generalized deconditioning, decreased postural control, limited by weight bearing precautions for transfers only ?Motor   ?Motor ?Motor: Abnormal postural alignment and control ?Motor - Discharge Observations: generalized weakness ? ?   ?Trunk/Postural Assessment  ?Cervical Assessment ?Cervical Assessment: Exceptions to [facility] ?Thoracic Assessment ?Thoracic Assessment: Exceptions to [facility] ?Lumbar Assessment ?Lumbar Assessment: Exceptions to [facility] ?Postural Control ?Postural Control: Deficits on evaluation  ?Balance ?Static Sitting Balance ?Static Sitting -  Level of Assistance: 7: Independent ?Dynamic Sitting Balance ?Dynamic Sitting - Level of Assistance: 6: Modified independent (Device/Increase time) ?Static Standing Balance ?Static Standing - Level of Assistance: 5: Stand by assistance ?Dynamic Standing Balance ?Dynamic Standing - Level of Assistance: 5: Stand by assistance ?Extremity/Trunk Assessment ?RUE Assessment ?RUE Assessment: Within Functional Limits ?LUE Assessment ?LUE Assessment: Within Functional Limits ? ? ?[place] ?01/24/2022, 1:04 PM ?

## 2022-01-21 NOTE — Progress Notes (Signed)
Occupational Therapy Session Note ? ?Patient Details  ?Name: Holly [name] ?MRN: [id number] ?Date of Birth: 1946/06/22 ? ?Today's Date: 01/21/2022 ?OT Missed Time: 60 Minutes ?Missed Time Reason: Pain ? ? ?Short Term Goals: ?Week 1:  OT Short Term Goal 1 (Week 1): The pt will complete UB/LB bathing with with MinA using AE of a long hand sponge as needed ?OT Short Term Goal 2 (Week 1): The pt will complete UB/LB dressing with MinA using AE of a reacher for LE dressing as needed. ?OT Short Term Goal 3 (Week 1): The pt will present with UB strength of 3+/5 MMT for safe and ind task persormance ?OT Short Term Goal 4 (Week 1): The pt will complete toilet transfers with ModI for safe and independent function fot transitioning to home. ?OT Short Term Goal 5 (Week 1): The pt will complete sit to stand with S incorporating RW for ind and safety during functional task completeion. ?Week 2:  OT Short Term Goal 1 (Week 2): The pt will complete UB/LB bathing with with MinA using AE of a long hand sponge as needed ?OT Short Term Goal 2 (Week 2): The pt will complete UB/LB dressing with MinA using AE of a reacher for LE dressing as needed. ?OT Short Term Goal 3 (Week 2): The pt will present with UB strength of 3+/5 MMT for safe and ind task persormance ?OT Short Term Goal 4 (Week 2): The pt will complete toilet transfers with ModI for safe and independent function fot transitioning to home. ?OT Short Term Goal 5 (Week 2): The pt will complete sit to stand with S incorporating RW for ind and safety during functional task completeion. ? ?Skilled Therapeutic Interventions/Progress Updates:  ?   ?Session 1 Attempt: Pt received semi-reclined in bed, politely declining therapy / OOB activity this date due to abdominal pain. Reports being premedicated and declining additional intervention/repositioning at this time. Pt missed 60 min of scheduled therapy due to abdominal pain. Pt left semi-reclined in bed with call bell in reach, bed alarm  engaged, all immediate needs met. ? ?Session Attempt 2: Attempted to see pt to make up earlier missed session. Pt continues to adamantly/politely decline OOB activity/repositioning/ADL again at this time due to abdominal pain. RN notified of pt request for pain rx. Pt left semi-reclined in bed, call bell in reach, all immediate needs met. ? ? ?Therapy Documentation ?Precautions:  ?Precautions ?Precautions: Fall ?Precaution Comments: colostomy; rt quadrant drain;  recent left tibia fx ?Restrictions ?Weight Bearing Restrictions: Yes ?LLE Weight Bearing: Weight bearing as tolerated ?Other Position/Activity Restrictions: WBAT for transfers only ? ?Pain: ongoing abdominal pain, see above for details, unrated ? ?ADL: See Care Tool for more details. ? ?Therapy/Group: Individual Therapy ? ?Volanda Napoleon MS, OTR/L ? ?01/21/2022, 7:01 AM ?

## 2022-01-21 NOTE — Progress Notes (Signed)
Inpatient Rehabilitation Care Coordinator ?Discharge Note  ? ?Patient Details  ?Name: Holly [name] ?MRN: [id number] ?Date of Birth: Dec 16, 1945 ? ? ?Discharge location: HOME WITH SON WHO IS STAYING WITH HER-DAUGHTER IS MAIN CAREGIVER BUT DOES NOT LIVE WITH HER ? ?Length of Stay: 15 DAYS ? ?Discharge activity level: SUPERVISION-CGA LEVEL-WHEELCHAIR LEVEL ? ?Home/community participation: ACTIVE ? ?Patient response EX:NTZGYF Literacy - How often do you need to have someone help you when you read instructions, pamphlets, or other written material from your doctor or pharmacy?: Never ? ?Patient response VC:BSWHQP Isolation - How often do you feel lonely or isolated from those around you?: Never ? ?Services provided included: MD, RD, PT, OT, RN, CM, Pharmacy, SW ? ?Financial Services:  ?Charity fundraiser Utilized: Medicare ?  ? ?Choices offered to/list presented to: PT AND DAUGHTER ? ?Follow-up services arranged:  ?Home Health, DME, Patient/Family request agency HH/DME ?Home Health Agency: [facility]-PT, OT, RN, AIDE  ?  ?DME : [facility] ?HH/DME Requested Agency: [facility].HAS OTHER EQUIPMENT FROM PAST ADMISSIONS ? ?Patient response to transportation need: ?Is the patient able to respond to transportation needs?: Yes ?In the past 12 months, has lack of transportation kept you from medical appointments or from getting medications?: No ?In the past 12 months, has lack of transportation kept you from meetings, work, or from getting things needed for daily living?: No ? ? ? ?Comments (or additional information): DAUGHTER WAS IN FOR EDUCATION AND FEELS COMFORTABLE WITH MOM'S CARE NEEDS. PT LEARNING HER OWN COLOSTOMY CARE, DAUGHTER EDUCATED AND CHECKED OFF ON THIS.  ? ?Patient/Family verbalized understanding of follow-up arrangements:  Yes ? ?Individual responsible for coordination of the follow-up plan: Holly [name]-DAUGHTER [phone] ? ?Confirmed correct DME delivered: [name] 01/21/2022    ? ?[name] ?

## 2022-01-22 ENCOUNTER — Other Ambulatory Visit: Payer: Self-pay | Admitting: Physical Medicine and Rehabilitation

## 2022-01-22 DIAGNOSIS — R5381 Other malaise: Secondary | ICD-10-CM | POA: Diagnosis not present

## 2022-01-22 LAB — CBC
HCT: 27.5 % — ABNORMAL LOW (ref 36.0–46.0)
Hemoglobin: 7.9 g/dL — ABNORMAL LOW (ref 12.0–15.0)
MCH: 27.9 pg (ref 26.0–34.0)
MCHC: 28.7 g/dL — ABNORMAL LOW (ref 30.0–36.0)
MCV: 97.2 fL (ref 80.0–100.0)
Platelets: 195 10*3/uL (ref 150–400)
RBC: 2.83 MIL/uL — ABNORMAL LOW (ref 3.87–5.11)
RDW: 18.8 % — ABNORMAL HIGH (ref 11.5–15.5)
WBC: 8.2 10*3/uL (ref 4.0–10.5)
nRBC: 0 % (ref 0.0–0.2)

## 2022-01-22 LAB — OCCULT BLOOD X 1 CARD TO LAB, STOOL: Fecal Occult Bld: POSITIVE — AB

## 2022-01-22 MED ORDER — PANTOPRAZOLE SODIUM 40 MG IV SOLR
40.0000 mg | Freq: Two times a day (BID) | INTRAVENOUS | Status: DC
  Administered 2022-01-22 – 2022-01-25 (×6): 40 mg via INTRAVENOUS
  Filled 2022-01-22 (×7): qty 10

## 2022-01-22 NOTE — Progress Notes (Addendum)
?                                                       PROGRESS NOTE ? ? ?Subjective/Complaints: ? ?Stool dark and tarry in colostomy- had been brown prior. Repeated CBC and it shows further hemoglobin drop from 9 to 7.9. Stool occult ordered and repeat CBC for tomorrow.  ? ?ROS: ? ?Pt denies SOB, CP, N/V/C/D, and vision changes, +pain where abdominal sutures were removed ? ?Objective: ?  ?No results found. ?Recent Labs  ?  01/22/22 ?1205  ?WBC 8.2  ?HGB 7.9*  ?HCT 27.5*  ?PLT 195  ? ?No results for input(s): NA, K, CL, CO2, GLUCOSE, BUN, CREATININE, CALCIUM in the last 72 hours. ? ? ?Intake/Output Summary (Last 24 hours) at 01/22/2022 1424 ?Last data filed at 01/22/2022 1315 ?Gross per 24 hour  ?Intake 320 ml  ?Output 400 ml  ?Net -80 ml  ?  ? ?Pressure Injury 01/06/22 Sacrum Unstageable - Full thickness tissue loss in which the base of the injury is covered by slough (yellow, tan, gray, green or brown) and/or eschar (tan, brown or black) in the wound bed. yellow slough covering wound base (Active)  ?01/06/22 1603  ?Location: Sacrum  ?Location Orientation:   ?Staging: Unstageable - Full thickness tissue loss in which the base of the injury is covered by slough (yellow, tan, gray, green or brown) and/or eschar (tan, brown or black) in the wound bed.  ?Wound Description (Comments): yellow slough covering wound base  ?Present on Admission: Yes  ? ? ?Physical Exam: ?Vital Signs ?Blood pressure (!) 116/53, pulse 66, temperature 98.7 ?F (37.1 ?C), temperature source Oral, resp. rate 16, height 5' (1.524 m), weight 64.7 kg, SpO2 100 %. ? ? ? ? ? ? ?General: awake, alert, appropriate, on CPAP in bed; woke for me; NAD ?HENT: conjugate gaze; oropharynx moist ?CV: regular rate; no JVD ?Pulmonary: CTA B/L; no W/R/R- decreased at bases- wearing O2 by [place] ?GI: soft, NT, ND, (+)BS- colostomy- sutures out, black tarry stool in bag ?Psychiatric: appropriate ?Neurological: Ox3 ?Skin:tunneled catheter in LUE- looks OK ?  General: Skin  is warm and dry.  ?   Comments: Midline incision and left lower extremity incisions healing without signs of infection  ?Neurological:  ?   Mental Status: She is alert and oriented to person, place, and time.  ?   Motor: Weakness present.  ?   Comments: Weak dorsiflexion of right foot  ? ?Assessment/Plan: ?1. Functional deficits which require 3+ hours per day of interdisciplinary therapy in a comprehensive inpatient rehab setting. ?Physiatrist is providing close team supervision and 24 hour management of active medical problems listed below. ?Physiatrist and rehab team continue to assess barriers to discharge/monitor patient progress toward functional and medical goals ? ?Care Tool: ? ?Bathing ?   ?Body parts bathed by patient: Right arm, Left arm, Chest, Abdomen, Front perineal area, Buttocks, Face, Right upper leg, Left upper leg  ? Body parts bathed by helper: Right lower leg, Left lower leg ?  ?  ?Bathing assist Assist Level: Minimal Assistance - Patient > 75% ?  ?  ?Upper Body Dressing/Undressing ?Upper body dressing   ?What is the patient wearing?: Dress ?   ?Upper body assist Assist Level: Supervision/Verbal cueing ?   ?Lower Body Dressing/Undressing ?Lower body dressing ? ? ?   ?  What is the patient wearing?: Incontinence brief ? ?  ? ?Lower body assist Assist for lower body dressing: Minimal Assistance - Patient > 75% ?   ? ?Toileting ?Toileting    ?Toileting assist Assist for toileting: Minimal Assistance - Patient > 75% ?  ?  ?Transfers ?Chair/bed transfer ? ?Transfers assist ?   ? ?Chair/bed transfer assist level: Minimal Assistance - Patient > 75% ?  ?  ?Locomotion ?Ambulation ? ? ?Ambulation assist ? ? Ambulation activity did not occur: Safety/medical concerns ? ?  ?  ?   ? ?Walk 10 feet activity ? ? ?Assist ? Walk 10 feet activity did not occur: Safety/medical concerns ? ?  ?   ? ?Walk 50 feet activity ? ? ?Assist Walk 50 feet with 2 turns activity did not occur: Safety/medical concerns ? ?  ?    ? ? ?Walk 150 feet activity ? ? ?Assist Walk 150 feet activity did not occur: Safety/medical concerns ? ?  ?  ?  ? ?Walk 10 feet on uneven surface  ?activity ? ? ?Assist Walk 10 feet on uneven surfaces activity did not occur: Safety/medical concerns ? ? ?  ?   ? ?Wheelchair ? ? ? ? ?Assist Is the patient using a wheelchair?: Yes ?Type of Wheelchair: Manual ?  ? ?Wheelchair assist level: Dependent - Patient 0% (limited by pain) ?   ? ? ?Wheelchair 50 feet with 2 turns activity ? ? ? ?Assist ? ?  ?  ? ? ?Assist Level: Dependent - Patient 0%  ? ?Wheelchair 150 feet activity  ? ? ? ?Assist ?   ? ? ?Assist Level: Dependent - Patient 0%  ? ?Blood pressure (!) 116/53, pulse 66, temperature 98.7 ?F (37.1 ?C), temperature source Oral, resp. rate 16, height 5' (1.524 m), weight 64.7 kg, SpO2 100 %. ? ?Medical Problem List and Plan: ?1. Functional deficits secondary to perforated diverticulum, exploratory laparotomy with colostomy, recent tibial plateau fracture repair. ?            -patient may shower but colostomy must be covered ?            -ELOS/Goals: MinA/S 12-16 days     ?            d/c  4/10-  ? Continue CIR- PT, OT - d/c Monday ?2.  Antithrombotics: ?-DVT/anticoagulation:  Pharmaceutical: Other (comment)Eliquis for 3 months for right axillary vein DVT. Eliquis held 4/8 given possible GI bleed ?            -antiplatelet therapy: none ?3. Pain: continue Tylenol, oxycodone PRN ?            --Robaxin 500 mg q 6 hours ? 4/7- pain controlled- con't regimen ?4. Mood: LCSW to evaluate and provide emotional support ?            -continue Wellbutrin XL 300 mg q AM ?            -continue Buspar 5 mg BID ?            --continue Xanax 0.5 mg prn anxiety ?            -antipsychotic agents: n/a ?5. Neuropsych: This patient is capable of making decisions on her own behalf. ?6. Skin/Wound Care: Routine skin care checks ?            -- Monitor midline incision, LLE incisions ?            -- Routine stoma care ?            --  Monitor  RUE bullous lesions ? 3/31- staples not ready to come out- surgery was 3/21- likely d/c next week  ? 4/4- will find out from surgeon when can remove staples- is go by days, they're due to be removed.  ? 4/6- have my chart secure chatted PA form surgery to see if can remove staples- has been 16 days.  ? 4/7- ordered for staples ot be removed ?7. 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 ?10: COPD: continue chronic prednisone 20 mg daily ?            -continue Breo Ellipta + Incruse Ellipta ?            --albuterol nebs prn ? 3/30- on O2 3L -home O2- con't ?11: OSA: continue CPAP nightly; ? O2 bleed in ?12: Recent repair of left tibial plateau fracture with bone biopsies ?            --s/p ORIF by Dr Doreatha Martin 12/13/2021 ?            --weight bearing with transfers only; unrestricted ROM ?            --bone culture +MRSA ?            --continue daptomycin through 4/26 ?            --follow-up with ID ? ?13: Hypertension: continue Toprol XL 50 mg daily ?Vitals:  ? 01/22/22 0423 01/22/22 1315  ?BP: (!) 140/58 (!) 116/53  ?Pulse: 70 66  ?Resp: 16 16  ?Temp: 98.7 ?F (37.1 ?C) 98.7 ?F (37.1 ?C)  ?SpO2: 92% 100%  ? 4/4- BP rising- will restart Cozaar 25 mg daily- and monitor ? 4/5- BP better overall- high this AM, but was much better [id number] systolic yesterday after Cozaar- con't reigmen for now ? 4/6- just added Cozaar back- wil wait til tomorrow to see if BP better ? 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 ?  Latest Ref Rng & Units 01/19/2022  ?  5:28 AM 01/17/2022  ?  5:14 AM 01/13/2022  ?  5:07 AM  ?BMP  ?Glucose 70 - 99 mg/dL 92   97   86    ?BUN 8 - 23 mg/dL 12   10   8     ?Creatinine 0.44 - 1.00 mg/dL 0.68   0.65   0.82    ?Sodium 135 - 145 mmol/L 141   137   142    ?Potassium 3.5 - 5.1 mmol/L 3.5   4.0   3.7    ?Chloride 98 - 111 mmol/L 98    100   104    ?CO2 22 - 32 mmol/L 39   30   31    ?Calcium 8.9 - 10.3 mg/dL 9.6   9.0   8.8    ? ?Renal fx stable should be able to resume cozaar  ?15: Hypothyroidism: continue Synthroid ?16: ? Cognitive dec

## 2022-01-22 NOTE — Progress Notes (Signed)
Physical Therapy Session Note ? ?Patient Details  ?Name: Holly [name] ?MRN: [id number] ?Date of Birth: 09-Jul-1946 ? ?Today's Date: 01/22/2022 ?PT Individual Time: 6962-9528 ?PT Individual Time Calculation (min): 57 min  ? ?Short Term Goals: ?Week 2:  PT Short Term Goal 1 (Week 2): STG=LTG based on ELOS ? ?Skilled Therapeutic Interventions/Progress Updates:  ?Chart reviewed and pt agreeable to therapy. Pt received semi-reclined in bed with no c/o pain but soreness where stitches previously removed. Also of note, pt was on 3L O2 via [place] which is reported baseline. Session focused on family education and preparation for graduation day. Pt initiated session with instructions to daughter on [facility] parts and management. Daughter expressed prior experience and was able to demonstrate correct management of WC parts. PT and daughter then prepared pt for transfer to [facility] which start with transfer to EOB under supervision. PT then demonstrated set up for safe SPT, which daughter was able to maintain and guard pt during with SBA + RW. Once in [facility], pt was transferred to therapy gym for car transfer practice. Daughter expressed previous experience with transfer, so car set up adjusted to appropriate height per daughter feedback, and daughter asked to demonstrate current method of transfer. Daughter correctly positioned and locked WC and used giat belt during transfer. Daughter noted to not normally remove leg rests, but PT noted need to remove leg rests in near future to be closer to pt during transfer, which daughter agreed. Pt noted to need quick SBA with L leg management/placement, which daughter confirmed will be present. Daughter then successfully demonstrated assist of pt during return to [facility] from car with CGA + car features. Pt then returned to room and competed 2x SPT transfer between WCx2 using daughter CGA + RW. Daughter demonstrated correct positioning, guarding, and WC safety during transfer. Pt was left seated in [facility] with alarm  engaged, nurse call bell and all needs in reach.  ? ?Therapy Documentation ?Precautions:  ?Precautions ?Precautions: Fall ?Precaution Comments: colostomy; recent left tibia fx ?Restrictions ?Weight Bearing Restrictions: Yes ?LLE Weight Bearing: Weight bearing as tolerated ?Other Position/Activity Restrictions: WBAT for transfers only ? ? ? ? ?Therapy/Group: Individual Therapy ? ?Marquette Saa, PT, DPT ?01/22/2022, 11:45 AM  ?

## 2022-01-22 NOTE — Progress Notes (Signed)
Occupational Therapy Session Note ? ?Patient Details  ?Name: Holly [name] ?MRN: [id number] ?Date of Birth: 13-Apr-1946 ? ?Today's Date: 01/22/2022 ?OT Individual Time: 1130-1230 ?OT Individual Time Calculation (min): 60 min  ? ? ?Short Term Goals: ?Week 2:  OT Short Term Goal 1 (Week 2): STGs = LTGs ?OT Short Term Goal 2 (Week 2): STGs = LTGs ?OT Short Term Goal 3 (Week 2): STGs = LTGs ?OT Short Term Goal 4 (Week 2): STGs = LTGs ?OT Short Term Goal 5 (Week 2): STGs = LTGs ? ?Skilled Therapeutic Interventions/Progress Updates: Patient was lying in bed with HOB elevated upon approach to offer OT services and patient and family education. Patient and dtr participated as follows: ? ? Patient and daughter acknowleged that they were to asked to participate in patient/family education this session per primary OT request.  As well, dtr proceeded to share with this clinician the high level of assistance that she provided to her mother prior to this hospital admission.  However, she also stated that she would like for her mother to take this opportunity to learn to do more for herself.  Additionally she shared that she has already has been helping mom transfer in and out of bed to [facility].  ? ?Patient and dtr were re educated on energy conservation techniques and stated they understood and the importance there of. ? ?Bed to/fr 3:1 (Ms. Ventura refused to complete a recliner to/frm BSC transfer although dtr had previously shared with staff that she will need to be able to do this at home as independently as possible) with assist of her dtr = CGA to min A.  According to dtr when Ms. Calzadilla is not fatigued, she is able to transfer to/fr [facility] with less effort and assist as she is interested in being able to complete this particular task. ? ?Patient and family were educated on adaptive equipment (reacher, sockaide, long handled shoe horn) use to conserve energy for days when Ms. Cosner is not able to bathe dress or reacher her feet or  when fatigued in order to conserve energy and to pick up items off the floor (such as dropped piece of clothing or other).  Patient and dtr were especially interested in learning use of the long handled shoe horn.  Patient hurried through therapeutic education as she stated that she was ready to get back in bed.  She was able utililze the long handled shoe horn with multiple attempts (6) and repeated instructions for techniques.  She stated she wanted to learn to use the shoe horn even and continued to receive education for technique.   She required reminders to place her heel in front of and against the shoe horn in order for her foot to easily slide into the shoe.  Other equipment patient and dtr were educated on includes sock aide and long handled sponge. ? ?Dtr asked for education on how to help her mother increase her lower extremity strength as according to dtr,"it might keep her from falling so often."   After this clinician had discussion with the physical therapist, it was agreed that with patient's L LE allowed to weightbear for completing transfers to educate her on seated work. This clinician also educated patient and dtr on the importance of UE strength and core stability and strength as well.  Patient was educated on both lower extremity strength related to increasing bilateral hip flexion (bent knee raises) to help with donning socks and socks and self care transfers.  Patient required much assist and was educated to not work past her endurance tolerance.  Patient and dtr were educated on lengtheing abdominal muscles as well to help aide recovery in abdominal muscles post colostomy placement. ? ?Patient was assisted back to bed and left in the safety of her dtr in room at the end of the session. ? ?Continue plan of care that has been adjusted by OTR due to poor patient endurance and abdominal wound/dressing area that has to be kept dry and due to decreased core strength inhibiting higher level of  function at this time.   Dtr, son and other family members will assist patient at home per dtr explanation this visit. ? ?   ? ?Therapy Documentation ?Precautions:  ?Precautions ?Precautions: Fall ?Precaution Comments: colostomy; recent left tibia fx ?Restrictions ?Weight Bearing Restrictions: Yes ?LLE Weight Bearing: Weight bearing as tolerated ?Other Position/Activity Restrictions: WBAT for transfers only ? ?Pain:denied ? ? ?Therapy/Group: Individual Therapy ? ?Herschell Dimes ?01/22/2022, 2:52 PM ?

## 2022-01-22 NOTE — Progress Notes (Signed)
Patient has home CPAP unit at bedside. RN present to give medications. States he can assist patient with CPAP. RN aware to call for assistance if needed. ?

## 2022-01-23 DIAGNOSIS — R5381 Other malaise: Secondary | ICD-10-CM | POA: Diagnosis not present

## 2022-01-23 LAB — HEMOGLOBIN AND HEMATOCRIT, BLOOD
HCT: 26.3 % — ABNORMAL LOW (ref 36.0–46.0)
Hemoglobin: 7.7 g/dL — ABNORMAL LOW (ref 12.0–15.0)

## 2022-01-23 NOTE — Consult Note (Signed)
Referring Provider: Dr. Ranell Patrick ?Primary Care Physician:  Harlan Stains, MD ?Primary Gastroenterologist:  Althia Forts ? ?Reason for Consultation:  GI Bleed ? ?HPI: Holly [name] is a 76 y.o. female with multiple medical problems who had a sigmoid colectomy with end colostomy last month due to a perforated diverticulum. On Eliquis due to a DVT and yesterday started having black tarry stools in her colostomy after it had been dark brown before then. Last dose of Eliquis was 01/22/22 AM. Hgb drop to 7.9 from 9 and today it is 7.7. Heme positive stool. EGD in 2022 showed multiple small clean-based gastric ulcers, esophagitis, and duodenitis. Biopsies were negative for inflammation or H. Pylori. Reports history of ulcers years ago. Denies abdominal pain, N/V. Dark brown stool noted in colostomy. ? ?Past Medical History:  ?Diagnosis Date  ? Anemia   ? blood transfusion November 2022  ? Anxiety   ? Asthma   ? Basal cell carcinoma   ? lung cancer.  Skin cancer- basal - nose removed  ? Bruises easily   ? Cataract   ? Cervicalgia   ? COPD (chronic obstructive pulmonary disease) ([place])   ? emphysema and COPD  ? Depression   ? Dyspnea   ? uses O2 only when needed. Uses 2 L   ? Foot swelling   ? left- has shown to Drs.  ? Frequency of urination   ? Hardware complicating wound infection ([place]) 12/24/2021  ? Headache   ? History of skin cancer   ? 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 Hypercalcemia   ? Hypertension   ? Hypothyroidism   ? Mixed hyperlipidemia   ? MRSA infection 12/24/2021  ? Osteomyelitis of left tibia ([place]) 12/24/2021  ? Pneumonia   ? Pulmonary nodule   ? Sleep apnea   ? pt uses a cpap nightly  ? Thyroid neoplasm   ? Urinary incontinence   ? Wears dentures   ? Wears glasses   ? ? ?Past Surgical History:  ?Procedure Laterality Date  ? BIOPSY  02/17/2021  ? Procedure: BIOPSY;  Surgeon: Otis Brace, MD;  Location: [facility];  Service: Gastroenterology;;  ? BONE BIOPSY Left 12/13/2021  ? Procedure: BONE BIOPSY;  Surgeon: Shona Needles, MD;  Location: [facility];  Service: Orthopedics;  Laterality: Left;  ? COLONOSCOPY    ? COLOSTOMY N/A 01/04/2022  ? Procedure: COLOSTOMY;  Surgeon: Felicie Morn, MD;  Location: [facility];  Service: General;  Laterality: N/A;  ? DENTAL SURGERY    ? ELBOW ARTHROPLASTY Right 2020  ? ESOPHAGOGASTRODUODENOSCOPY (EGD) WITH PROPOFOL N/A 02/17/2021  ? Procedure: ESOPHAGOGASTRODUODENOSCOPY (EGD) WITH PROPOFOL;  Surgeon: Otis Brace, MD;  [facility];  Service: Gastroenterology;  Laterality: N/A;  ? EYE SURGERY Right   ? cataract surgery  ? FOOT SURGERY  30 YRS AGO  ? BILATERAL  ? HARDWARE REMOVAL Right 10/02/2020  ? Procedure: HARDWARE REMOVAL RIGHT ELBOW;  Surgeon: Shona Needles, MD;  Location: [facility];  Service: Orthopedics;  Laterality: Right;  ? I & D EXTREMITY Right 01/13/2021  ? Procedure: IRRIGATION AND DEBRIDEMENT RIGHT ELBOW;  Surgeon: Shona Needles, MD;  Location: [facility];  Service: Orthopedics;  Laterality: Right;  ? LAPAROTOMY N/A 01/04/2022  ? Procedure: EXPLORATORY LAPAROTOMY;  Surgeon: Felicie Morn, MD;  Location: [facility];  Service: General;  Laterality: N/A;  ? ORIF ELBOW FRACTURE Right 12/06/2017  ? Procedure: OPEN REDUCTION INTERNAL FIXATION (ORIF) ELBOW/OLECRANON FRACTURE;  Surgeon: Shona Needles, MD;  Location: [place] [facility];  Service: Orthopedics;  Laterality: Right;  ? ORIF TIBIA PLATEAU Left 12/13/2021  ? Procedure: OPEN REDUCTION INTERNAL FIXATION (ORIF) TIBIAL PLATEAU;  Surgeon: Shona Needles, MD;  Location: [facility];  Service: Orthopedics;  Laterality: Left;  ? PARTIAL COLECTOMY N/A 01/04/2022  ? Procedure: SIGMOID COLECTOMY;  Surgeon: Felicie Morn, MD;  Location: [facility];  Service: General;  Laterality: N/A;  ? SKIN CANCER EXCISION    ? THYROIDECTOMY N/A 07/31/2014  ? Procedure: TOTAL THYROIDECTOMY;  Surgeon: Armandina Gemma, MD;  Location: [facility];  Service: General;  Laterality: N/A;  ? VAGINAL HYSTERECTOMY    ? VIDEO BRONCHOSCOPY WITH ENDOBRONCHIAL NAVIGATION Bilateral 06/25/2019   ? Procedure: VIDEO BRONCHOSCOPY WITH ENDOBRONCHIAL NAVIGATION;  Surgeon: Lajuana Matte, MD;  Location: [facility];  Service: Thoracic;  Laterality: Bilateral;  ? ? ?Prior to Admission medications   ?Medication Sig Start Date End Date Taking? Authorizing Provider  ?acetaminophen (TYLENOL) 650 MG CR tablet Take 650 mg by mouth every 8 (eight) hours as needed for pain.    [provider]  ?albuterol (PROVENTIL) (2.5 MG/3ML) 0.083% nebulizer solution Take 2.5 mg by nebulization 3 (three) times daily as needed for wheezing or shortness of breath.    [provider]  ?ALPRAZolam Duanne Moron) 0.5 MG tablet Take 0.5 mg by mouth 2 (two) times daily. 09/14/20   [provider]  ?apixaban (ELIQUIS) 5 MG TABS tablet Take 2 tablets (10 mg total) by mouth 2 (two) times daily for 6 days. 01/12/22 01/18/22  Barb Merino, MD  ?apixaban (ELIQUIS) 5 MG TABS tablet Take 1 tablet (5 mg total) by mouth 2 (two) times daily. 01/19/22 04/19/22  Barb Merino, MD  ?atorvastatin (LIPITOR) 20 MG tablet Take 20 mg by mouth at bedtime.     [provider]  ?buPROPion (WELLBUTRIN XL) 300 MG 24 hr tablet Take 300 mg by mouth every morning. 07/08/21   [provider]  ?busPIRone (BUSPAR) 5 MG tablet Take 1 tablet (5 mg total) by mouth 3 (three) times daily. ?Patient taking differently: Take 5 mg by mouth 2 (two) times daily. 08/31/21   Oswald Hillock, MD  ?daptomycin (CUBICIN) IVPB Inject 500 mg into the vein daily. Indication:  Left knee osteomyelitis ?First Dose: Yes ?Last Day of Therapy:  02/09/22 ?Labs - Once weekly:  CBC/D, BMP, and CPK ?Labs - Every other week:  ESR and CRP ?Method of administration: IV Push ?Method of administration may be changed at the discretion of home infusion pharmacist based upon assessment of the patient and/or caregiver's ability to self-administer the medication ordered. 01/12/22 02/16/22  Barb Merino, MD  ?donepezil (ARICEPT) 10 MG tablet Take 10 mg by mouth at bedtime. 12/17/20    [provider]  ?Fluticasone-Umeclidin-Vilant 100-62.5-25 MCG/INH AEPB Inhale 1 puff into the lungs daily. Trelegy    [provider]  ?furosemide (LASIX) 20 MG tablet Take 1 tablet (20 mg total) by mouth every other day. ?Patient taking differently: Take 20 mg by mouth 2 (two) times daily. 08/31/21   Oswald Hillock, MD  ?levothyroxine (SYNTHROID) 137 MCG tablet Take 137 mcg by mouth daily before breakfast. 09/22/20   [provider]  ?losartan (COZAAR) 50 MG tablet Take 1 tablet (50 mg total) by mouth daily. ?Patient taking differently: Take 50 mg by mouth at bedtime. 08/03/21   Oswald Hillock, MD  ?methocarbamol (ROBAXIN) 500 MG tablet Take 1 tablet (500 mg total) by mouth every 6 (six) hours as needed for muscle spasms. 01/12/22   Ghimire,  Dante Gang, MD  ?metoprolol succinate (TOPROL-XL) 50 MG 24 hr tablet Take 50 mg by mouth daily. Take with or immediately following a meal.    [provider]  ?mirtazapine (REMERON) 30 MG tablet Take 30 mg by mouth at bedtime. 12/03/21   [provider]  ?oxyCODONE-acetaminophen (PERCOCET/ROXICET) 5-325 MG tablet Take 1-2 tablets by mouth every 4 (four) hours as needed for severe pain or moderate pain. 01/12/22   Barb Merino, MD  ?pantoprazole (PROTONIX) 40 MG tablet Take 1 tablet (40 mg total) by mouth daily. ?Patient taking differently: Take 40 mg by mouth 2 (two) times daily. 08/02/21 01/01/22  Oswald Hillock, MD  ?predniSONE (DELTASONE) 20 MG tablet Take 20 mg by mouth daily. continuous 07/08/21   [provider]  ?PROCTO-MED HC 2.5 % rectal cream Apply 1 application topically daily as needed for hemorrhoids. 09/16/21   [provider]  ?VENTOLIN HFA 108 (90 Base) MCG/ACT inhaler INHALE 2 PUFFS INTO THE LUNGS EVERY 4 HOURS AS NEEDED FOR WHEEZING ORSHORTNESS OF BREATH ?Patient taking differently: Inhale 2 puffs into the lungs every 4 (four) hours as needed for wheezing or shortness of breath. 06/29/17   Parrett, Fonnie Mu, NP   ?vitamin C (ASCORBIC ACID) 500 MG tablet Take 500 mg by mouth daily.    [provider]  ?Vitamin D, Ergocalciferol, (DRISDOL) 1.25 MG (50000 UNIT) CAPS capsule Take 1 capsule (50,000 Units to

## 2022-01-23 NOTE — Progress Notes (Signed)
Occupational Therapy Session Note ? ?Patient Details  ?Name: Holly [name] ?MRN: [id number] ?Date of Birth: [birth year] ? ?Today's Date: 01/23/2022 ?OT Individual Time: 4680-3212 ?OT Individual Time Calculation (min): 30 min  ?Pt missed 30 min skilled OT dt gastro MD needing to examine and pt fatigue ? ? ?Short Term Goals: ?Week 1:  OT Short Term Goal 1 (Week 1): The pt will complete UB/LB bathing with with MinA using AE of a long hand sponge as needed ?OT Short Term Goal 1 - Progress (Week 1): Met ?OT Short Term Goal 2 (Week 1): The pt will complete UB/LB dressing with MinA using AE of a reacher for LE dressing as needed. ?OT Short Term Goal 2 - Progress (Week 1): Met ?OT Short Term Goal 3 (Week 1): The pt will present with UB strength of 3+/5 MMT for safe and ind task persormance ?OT Short Term Goal 3 - Progress (Week 1): Met ?OT Short Term Goal 4 (Week 1): The pt will complete toilet transfers with ModI for safe and independent function fot transitioning to home. ?OT Short Term Goal 4 - Progress (Week 1): Progressing toward goal ?OT Short Term Goal 5 (Week 1): The pt will complete sit to stand with S incorporating RW for ind and safety during functional task completeion. ?OT Short Term Goal 5 - Progress (Week 1): Met ? ?Skilled Therapeutic Interventions/Progress Updates:  ?   ?Pt received in bed with unrated abdominal pain. Pt requires encouragement to do ADL to finalize OT needs this stay  ?ADL: ?Pt completes ADL at overall MOD I- CGA Level. Skilled interventions include: no cuing needed for transfers or hand placement during transitional movement, education and encouragement provided to improve participation in BADLs. Pt bathes seated on BSC with LHSS to wash B feet, CGA to pull up pants in back, and set up to don/doff shirt. Pt able ot assume figure 4 in BLE to doff and don socks. Exited session with pt back in bed with MD from gastro to examine pt. ? ?NT to check on pt when MD leaves that table is in reach and bed  rail up on air mattress ?  ? ?Therapy Documentation ?Precautions:  ?Precautions ?Precautions: Fall ?Precaution Comments: colostomy; recent left tibia fx ?Restrictions ?Weight Bearing Restrictions: Yes ?LLE Weight Bearing: Weight bearing as tolerated ?Other Position/Activity Restrictions: WBAT for transfers only ? ? ?Therapy/Group: Individual Therapy ? ?[name] ?01/23/2022, 6:06 AM ?

## 2022-01-23 NOTE — Progress Notes (Signed)
Physical Therapy Session Note ? ?Patient Details  ?Name: [name] ?MRN: [id number] ?Date of Birth: Apr 21, 1946 ? ?Today's Date: 01/23/2022 ?PT Individual Time: 1000-1040 ?PT Individual Time Calculation (min): 40 min  ? ?Short Term Goals: ?Week 2:  PT Short Term Goal 1 (Week 2): STG=LTG based on ELOS ? ?Skilled Therapeutic Interventions/Progress Updates:  ?  Pt received seated in bed, agreeable with encouragement to participate in PT session. Pt reports ongoing pain in her L abdominal region, not rated and reports being premedicated prior to start of therapy session. Bed mobility at Supervision level throughout session. Pt reports urge to urinate. Sit to stand and stand pivot transfer bed to from [facility] with RW at Supervision level. Pt able to continently void while seated on BSC, setup A for pericare. Pt requires assist to empty her ostomy bag. Assisted pt with donning pants while seated EOB. Stand pivot transfer bed to/from w/c with RW and Supervision. Manual w/c propulsion x 75 ft with use of BUE initially at Supervision level progressing to mod I level. Pt fatigues very quickly with w/c mobility. Encouraged pt to perform car transfer this date for grad day, pt declines and reports she completed a car transfer in previous therapy sessions. Pt requests to return to bed at end of session. Pt on 3L O2 via [place] throughout session, SpO2 remains at 95% and higher with activity. Pt missed 20 min of scheduled therapy session due to pain, fatigue, and declining further participation. ? ?Therapy Documentation ?Precautions:  ?Precautions ?Precautions: Fall ?Precaution Comments: colostomy; recent left tibia fx ?Restrictions ?Weight Bearing Restrictions: Yes ?LLE Weight Bearing: Weight bearing as tolerated ?Other Position/Activity Restrictions: WBAT for transfers only ?General: ?PT Amount of Missed Time (min): 20 Minutes ?PT Missed Treatment Reason: Patient fatigue;Pain;Patient unwilling to participate ? ? ? ? ?Therapy/Group:  Individual Therapy ? ? ?Excell Seltzer, PT, DPT, CSRS ?01/23/2022, 10:47 AM  ?

## 2022-01-23 NOTE — H&P (View-Only) (Signed)
Referring Provider: Dr. Raulkar ?Primary Care Physician:  White, Cynthia, MD ?Primary Gastroenterologist:  Unassigned ? ?Reason for Consultation:  GI Bleed ? ?HPI: Holly [name] is a 76 y.o. female with multiple medical problems who had a sigmoid colectomy with end colostomy last month due to a perforated diverticulum. On Eliquis due to a DVT and yesterday started having black tarry stools in her colostomy after it had been dark brown before then. Last dose of Eliquis was 01/22/22 AM. Hgb drop to 7.9 from 9 and today it is 7.7. Heme positive stool. EGD in 2022 showed multiple small clean-based gastric ulcers, esophagitis, and duodenitis. Biopsies were negative for inflammation or H. Pylori. Reports history of ulcers years ago. Denies abdominal pain, N/V. Dark brown stool noted in colostomy. ? ?Past Medical History:  ?Diagnosis Date  ? Anemia   ? blood transfusion November 2022  ? Anxiety   ? Asthma   ? Basal cell carcinoma   ? lung cancer.  Skin cancer- basal - nose removed  ? Bruises easily   ? Cataract   ? Cervicalgia   ? COPD (chronic obstructive pulmonary disease) ([facility])   ? emphysema and COPD  ? Depression   ? Dyspnea   ? uses O2 only when needed. Uses 2 L   ? Foot swelling   ? left- has shown to Drs.  ? Frequency of urination   ? Hardware complicating wound infection ([facility]) 12/24/2021  ? Headache   ? History of skin cancer   ? 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 Hypercalcemia   ? Hypertension   ? Hypothyroidism   ? Mixed hyperlipidemia   ? MRSA infection 12/24/2021  ? Osteomyelitis of left tibia ([facility]) 12/24/2021  ? Pneumonia   ? Pulmonary nodule   ? Sleep apnea   ? pt uses a cpap nightly  ? Thyroid neoplasm   ? Urinary incontinence   ? Wears dentures   ? Wears glasses   ? ? ?Past Surgical History:  ?Procedure Laterality Date  ? BIOPSY  02/17/2021  ? Procedure: BIOPSY;  Surgeon: Brahmbhatt, Parag, MD;  [facility];  Service: Gastroenterology;;  ? BONE BIOPSY Left 12/13/2021  ? Procedure: BONE BIOPSY;  Surgeon: Haddix,  Kevin P, MD;  [facility];  Service: Orthopedics;  Laterality: Left;  ? COLONOSCOPY    ? COLOSTOMY N/A 01/04/2022  ? Procedure: COLOSTOMY;  Surgeon: Stechschulte, Paul J, MD;  [facility];  Service: General;  Laterality: N/A;  ? DENTAL SURGERY    ? ELBOW ARTHROPLASTY Right 2020  ? ESOPHAGOGASTRODUODENOSCOPY (EGD) WITH PROPOFOL N/A 02/17/2021  ? Procedure: ESOPHAGOGASTRODUODENOSCOPY (EGD) WITH PROPOFOL;  Surgeon: Brahmbhatt, Parag, MD;  [facility];  Service: Gastroenterology;  Laterality: N/A;  ? EYE SURGERY Right   ? cataract surgery  ? FOOT SURGERY  30 YRS AGO  ? BILATERAL  ? HARDWARE REMOVAL Right 10/02/2020  ? Procedure: HARDWARE REMOVAL RIGHT ELBOW;  Surgeon: Haddix, Kevin P, MD;  [facility];  Service: Orthopedics;  Laterality: Right;  ? I & D EXTREMITY Right 01/13/2021  ? Procedure: IRRIGATION AND DEBRIDEMENT RIGHT ELBOW;  Surgeon: Haddix, Kevin P, MD;  [facility];  Service: Orthopedics;  Laterality: Right;  ? LAPAROTOMY N/A 01/04/2022  ? Procedure: EXPLORATORY LAPAROTOMY;  Surgeon: Stechschulte, Paul J, MD;  [facility];  Service: General;  Laterality: N/A;  ? ORIF ELBOW FRACTURE Right 12/06/2017  ? Procedure: OPEN REDUCTION INTERNAL FIXATION (ORIF) ELBOW/OLECRANON FRACTURE;  Surgeon: Haddix, Kevin P, MD;  [facility];    Service: Orthopedics;  Laterality: Right;  ? ORIF TIBIA PLATEAU Left 12/13/2021  ? Procedure: OPEN REDUCTION INTERNAL FIXATION (ORIF) TIBIAL PLATEAU;  Surgeon: Haddix, Kevin P, MD;  [facility];  Service: Orthopedics;  Laterality: Left;  ? PARTIAL COLECTOMY N/A 01/04/2022  ? Procedure: SIGMOID COLECTOMY;  Surgeon: Stechschulte, Paul J, MD;  [facility];  Service: General;  Laterality: N/A;  ? SKIN CANCER EXCISION    ? THYROIDECTOMY N/A 07/31/2014  ? Procedure: TOTAL THYROIDECTOMY;  Surgeon: Todd Gerkin, MD;  [facility];  Service: General;  Laterality: N/A;  ? VAGINAL HYSTERECTOMY    ? VIDEO BRONCHOSCOPY WITH ENDOBRONCHIAL NAVIGATION Bilateral 06/25/2019   ? Procedure: VIDEO BRONCHOSCOPY WITH ENDOBRONCHIAL NAVIGATION;  Surgeon: Lightfoot, Harrell O, MD;  [facility];  Service: Thoracic;  Laterality: Bilateral;  ? ? ?Prior to Admission medications   ?Medication Sig Start Date End Date Taking? Authorizing Provider  ?acetaminophen (TYLENOL) 650 MG CR tablet Take 650 mg by mouth every 8 (eight) hours as needed for pain.    [provider]  ?albuterol (PROVENTIL) (2.5 MG/3ML) 0.083% nebulizer solution Take 2.5 mg by nebulization 3 (three) times daily as needed for wheezing or shortness of breath.    [provider]  ?ALPRAZolam (XANAX) 0.5 MG tablet Take 0.5 mg by mouth 2 (two) times daily. 09/14/20   [provider]  ?apixaban (ELIQUIS) 5 MG TABS tablet Take 2 tablets (10 mg total) by mouth 2 (two) times daily for 6 days. 01/12/22 01/18/22  Ghimire, Kuber, MD  ?apixaban (ELIQUIS) 5 MG TABS tablet Take 1 tablet (5 mg total) by mouth 2 (two) times daily. 01/19/22 04/19/22  Ghimire, Kuber, MD  ?atorvastatin (LIPITOR) 20 MG tablet Take 20 mg by mouth at bedtime.     [provider]  ?buPROPion (WELLBUTRIN XL) 300 MG 24 hr tablet Take 300 mg by mouth every morning. 07/08/21   [provider]  ?busPIRone (BUSPAR) 5 MG tablet Take 1 tablet (5 mg total) by mouth 3 (three) times daily. ?Patient taking differently: Take 5 mg by mouth 2 (two) times daily. 08/31/21   Lama, Gagan S, MD  ?daptomycin (CUBICIN) IVPB Inject 500 mg into the vein daily. Indication:  Left knee osteomyelitis ?First Dose: Yes ?Last Day of Therapy:  02/09/22 ?Labs - Once weekly:  CBC/D, BMP, and CPK ?Labs - Every other week:  ESR and CRP ?Method of administration: IV Push ?Method of administration may be changed at the discretion of home infusion pharmacist based upon assessment of the patient and/or caregiver's ability to self-administer the medication ordered. 01/12/22 02/16/22  Ghimire, Kuber, MD  ?donepezil (ARICEPT) 10 MG tablet Take 10 mg by mouth at bedtime. 12/17/20    [provider]  ?Fluticasone-Umeclidin-Vilant 100-62.5-25 MCG/INH AEPB Inhale 1 puff into the lungs daily. Trelegy    [provider]  ?furosemide (LASIX) 20 MG tablet Take 1 tablet (20 mg total) by mouth every other day. ?Patient taking differently: Take 20 mg by mouth 2 (two) times daily. 08/31/21   Lama, Gagan S, MD  ?levothyroxine (SYNTHROID) 137 MCG tablet Take 137 mcg by mouth daily before breakfast. 09/22/20   [provider]  ?losartan (COZAAR) 50 MG tablet Take 1 tablet (50 mg total) by mouth daily. ?Patient taking differently: Take 50 mg by mouth at bedtime. 08/03/21   Lama, Gagan S, MD  ?methocarbamol (ROBAXIN) 500 MG tablet Take 1 tablet (500 mg total) by mouth every 6 (six) hours as needed for muscle spasms. 01/12/22   Ghimire,   Kuber, MD  ?metoprolol succinate (TOPROL-XL) 50 MG 24 hr tablet Take 50 mg by mouth daily. Take with or immediately following a meal.    [provider]  ?mirtazapine (REMERON) 30 MG tablet Take 30 mg by mouth at bedtime. 12/03/21   [provider]  ?oxyCODONE-acetaminophen (PERCOCET/ROXICET) 5-325 MG tablet Take 1-2 tablets by mouth every 4 (four) hours as needed for severe pain or moderate pain. 01/12/22   Ghimire, Kuber, MD  ?pantoprazole (PROTONIX) 40 MG tablet Take 1 tablet (40 mg total) by mouth daily. ?Patient taking differently: Take 40 mg by mouth 2 (two) times daily. 08/02/21 01/01/22  Lama, Gagan S, MD  ?predniSONE (DELTASONE) 20 MG tablet Take 20 mg by mouth daily. continuous 07/08/21   [provider]  ?PROCTO-MED HC 2.5 % rectal cream Apply 1 application topically daily as needed for hemorrhoids. 09/16/21   [provider]  ?VENTOLIN HFA 108 (90 Base) MCG/ACT inhaler INHALE 2 PUFFS INTO THE LUNGS EVERY 4 HOURS AS NEEDED FOR WHEEZING ORSHORTNESS OF BREATH ?Patient taking differently: Inhale 2 puffs into the lungs every 4 (four) hours as needed for wheezing or shortness of breath. 06/29/17   Parrett, Tammy S, NP   ?vitamin C (ASCORBIC ACID) 500 MG tablet Take 500 mg by mouth daily.    [provider]  ?Vitamin D, Ergocalciferol, (DRISDOL) 1.25 MG (50000 UNIT) CAPS capsule Take 1 capsule (50,000 Units to

## 2022-01-23 NOTE — Progress Notes (Signed)
?                                                       PROGRESS NOTE ? ? ?Subjective/Complaints: ?Appreciate GI consult this morning: discussed with patient plan for endoscopy tomorrow, hgb continuing to trend down to 7.7. She does have some abdominal pain. Discussed her history of ulcers, IV protonix ordered and changed to clear liquid diet.  ? ?ROS: ? ?Pt denies SOB, CP, N/V/C/D, and vision changes, +pain where abdominal sutures were removed ? ?Objective: ?  ?No results found. ?Recent Labs  ?  01/22/22 ?1205 01/23/22 ?0318  ?WBC 8.2  --   ?HGB 7.9* 7.7*  ?HCT 27.5* 26.3*  ?PLT 195  --   ? ?No results for input(s): NA, K, CL, CO2, GLUCOSE, BUN, CREATININE, CALCIUM in the last 72 hours. ? ? ?Intake/Output Summary (Last 24 hours) at 01/23/2022 0923 ?Last data filed at 01/23/2022 0737 ?Gross per 24 hour  ?Intake 540 ml  ?Output --  ?Net 540 ml  ?  ? ?Pressure Injury 01/06/22 Sacrum Unstageable - Full thickness tissue loss in which the base of the injury is covered by slough (yellow, tan, gray, green or brown) and/or eschar (tan, brown or black) in the wound bed. yellow slough covering wound base (Active)  ?01/06/22 1603  ?Location: Sacrum  ?Location Orientation:   ?Staging: Unstageable - Full thickness tissue loss in which the base of the injury is covered by slough (yellow, tan, gray, green or brown) and/or eschar (tan, brown or black) in the wound bed.  ?Wound Description (Comments): yellow slough covering wound base  ?Present on Admission: Yes  ? ? ?Physical Exam: ?Vital Signs ?Blood pressure 107/63, pulse 68, temperature 97.9 ?F (36.6 ?C), temperature source Oral, resp. rate 16, height 5' (1.524 m), weight 64.7 kg, SpO2 96 %. ? ?Gen: no distress, normal appearing ?HEENT: oral mucosa pink and moist, NCAT ?Cardio: Reg rate ?Pulmonary: CTA B/L; no W/R/R- decreased at bases- wearing O2 by Anthon ?GI: soft, NT, ND, (+)BS- colostomy- sutures out, black tarry stool in bag ?Psychiatric: appropriate ?Neurological:  Ox3 ?Skin:tunneled catheter in LUE- looks OK ?  General: Skin is warm and dry.  ?   Comments: Midline incision and left lower extremity incisions healing without signs of infection  ?Neurological:  ?   Mental Status: She is alert and oriented to person, place, and time.  ?   Motor: Weakness present.  ?   Comments: Weak dorsiflexion of right foot  ? ?Assessment/Plan: ?1. Functional deficits which require 3+ hours per day of interdisciplinary therapy in a comprehensive inpatient rehab setting. ?Physiatrist is providing close team supervision and 24 hour management of active medical problems listed below. ?Physiatrist and rehab team continue to assess barriers to discharge/monitor patient progress toward functional and medical goals ? ?Care Tool: ? ?Bathing ?   ?Body parts bathed by patient: Right arm, Left arm, Chest, Abdomen, Front perineal area, Buttocks, Face, Right upper leg, Left upper leg, Left lower leg, Right lower leg  ? Body parts bathed by helper: Right lower leg, Left lower leg ?  ?  ?Bathing assist Assist Level: Supervision/Verbal cueing ?  ?  ?Upper Body Dressing/Undressing ?Upper body dressing   ?What is the patient wearing?: Pull over shirt ?   ?Upper body assist Assist Level: Supervision/Verbal cueing ?   ?  Lower Body Dressing/Undressing ?Lower body dressing ? ? ?   ?What is the patient wearing?: Pants ? ?  ? ?Lower body assist Assist for lower body dressing: Contact Guard/Touching assist ?   ? ?Toileting ?Toileting    ?Toileting assist Assist for toileting: Independent with assistive device ?  ?  ?Transfers ?Chair/bed transfer ? ?Transfers assist ?   ? ?Chair/bed transfer assist level: Minimal Assistance - Patient > 75% ?  ?  ?Locomotion ?Ambulation ? ? ?Ambulation assist ? ? Ambulation activity did not occur: Safety/medical concerns ? ?  ?  ?   ? ?Walk 10 feet activity ? ? ?Assist ? Walk 10 feet activity did not occur: Safety/medical concerns ? ?  ?   ? ?Walk 50 feet activity ? ? ?Assist Walk 50 feet  with 2 turns activity did not occur: Safety/medical concerns ? ?  ?   ? ? ?Walk 150 feet activity ? ? ?Assist Walk 150 feet activity did not occur: Safety/medical concerns ? ?  ?  ?  ? ?Walk 10 feet on uneven surface  ?activity ? ? ?Assist Walk 10 feet on uneven surfaces activity did not occur: Safety/medical concerns ? ? ?  ?   ? ?Wheelchair ? ? ? ? ?Assist Is the patient using a wheelchair?: Yes ?Type of Wheelchair: Manual ?  ? ?Wheelchair assist level: Dependent - Patient 0% (limited by pain) ?   ? ? ?Wheelchair 50 feet with 2 turns activity ? ? ? ?Assist ? ?  ?  ? ? ?Assist Level: Dependent - Patient 0%  ? ?Wheelchair 150 feet activity  ? ? ? ?Assist ?   ? ? ?Assist Level: Dependent - Patient 0%  ? ?Blood pressure 107/63, pulse 68, temperature 97.9 ?F (36.6 ?C), temperature source Oral, resp. rate 16, height 5' (1.524 m), weight 64.7 kg, SpO2 96 %. ? ?Medical Problem List and Plan: ?1. Functional deficits secondary to perforated diverticulum, exploratory laparotomy with colostomy, recent tibial plateau fracture repair. ?            -patient may shower but colostomy must be covered ?            -ELOS/Goals: MinA/S 12-16 days     ?           Plan for endoscopy Monday due to possible GI bleed.  ?2.  Antithrombotics: ?-DVT/anticoagulation:  Pharmaceutical: Other (comment)Eliquis for 3 months for right axillary vein DVT. Eliquis held 4/8 given possible GI bleed ?            -antiplatelet therapy: none ?3. Pain: continue Tylenol, oxycodone PRN ?            --continue Robaxin 500 mg q 6 hours ?4. Mood: LCSW to evaluate and provide emotional support ?            -continue Wellbutrin XL 300 mg q AM ?            -continue Buspar 5 mg BID ?            --continue Xanax 0.5 mg prn anxiety ?            -antipsychotic agents: n/a ?5. Neuropsych: This patient is capable of making decisions on her own behalf. ?6. Skin/Wound Care: Routine skin care checks ?            -- Monitor midline incision, LLE incisions ?            --  Routine stoma care ?            --  Monitor RUE bullous lesions ? 3/31- staples not ready to come out- surgery was 3/21- likely d/c next week  ? 4/4- will find out from surgeon when can remove staples- is go by days, they're due to be removed.  ? 4/6- have my chart secure chatted PA form surgery to see if can remove staples- has been 16 days.  ? 4/7- ordered for staples ot be removed ?7. 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 ?10: COPD: continue chronic prednisone 20 mg daily ?            -continue Breo Ellipta + Incruse Ellipta ?            --albuterol nebs prn ? 3/30- on O2 3L -home O2- con't ?11: OSA: continue CPAP nightly; ? O2 bleed in ?12: Recent repair of left tibial plateau fracture with bone biopsies ?            --s/p ORIF by Dr Doreatha Martin 12/13/2021 ?            --weight bearing with transfers only; unrestricted ROM ?            --bone culture +MRSA ?            --continue daptomycin through 4/26 ?            --follow-up with ID ? ?13: Hypertension: continue Toprol XL 50 mg daily ?Vitals:  ? 01/22/22 1759 01/23/22 0347  ?BP: 108/66 107/63  ?Pulse: 66 68  ?Resp: 17 16  ?Temp: 98.6 ?F (37 ?C) 97.9 ?F (36.6 ?C)  ?SpO2: 100% 96%  ? 4/4- BP rising- will restart Cozaar 25 mg daily- and monitor ? 4/5- BP better overall- high this AM, but was much better [id number] systolic yesterday after Cozaar- con't reigmen for now ? 4/6- just added Cozaar back- wil wait til tomorrow to see if BP better ? 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 ?  Latest Ref Rng & Units 01/19/2022  ?  5:28 AM 01/17/2022  ?  5:14 AM 01/13/2022  ?  5:07 AM  ?BMP  ?Glucose 70 - 99 mg/dL 92   97   86    ?BUN 8 - 23 mg/dL 12   10   8     ?Creatinine 0.44 - 1.00 mg/dL 0.68   0.65   0.82    ?Sodium 135 - 145 mmol/L 141   137   142    ?Potassium 3.5 - 5.1 mmol/L 3.5   4.0   3.7     ?Chloride 98 - 111 mmol/L 98   100   104    ?CO2 22 - 32 mmol/L 39   30   31    ?Calcium 8.9 - 10.3 mg/dL 9.6   9.0   8.8    ? ?Renal fx stable should be able to resume cozaar  ?15: Hypothyroidism: continue Synthr

## 2022-01-24 ENCOUNTER — Inpatient Hospital Stay (HOSPITAL_COMMUNITY): Payer: Medicare Other | Admitting: Certified Registered Nurse Anesthetist

## 2022-01-24 ENCOUNTER — Encounter (HOSPITAL_COMMUNITY)
Admission: RE | Disposition: A | Discharge status: Home Health | Payer: Self-pay | Source: Intra-hospital | Attending: Physical Medicine and Rehabilitation

## 2022-01-24 DIAGNOSIS — D62 Acute posthemorrhagic anemia: Secondary | ICD-10-CM

## 2022-01-24 DIAGNOSIS — R5381 Other malaise: Secondary | ICD-10-CM | POA: Diagnosis not present

## 2022-01-24 DIAGNOSIS — K2971 Gastritis, unspecified, with bleeding: Secondary | ICD-10-CM

## 2022-01-24 DIAGNOSIS — K3189 Other diseases of stomach and duodenum: Secondary | ICD-10-CM

## 2022-01-24 DIAGNOSIS — K264 Chronic or unspecified duodenal ulcer with hemorrhage: Secondary | ICD-10-CM

## 2022-01-24 HISTORY — PX: HEMOSTASIS CLIP PLACEMENT: SHX6857

## 2022-01-24 HISTORY — PX: ESOPHAGOGASTRODUODENOSCOPY (EGD) WITH PROPOFOL: SHX5813

## 2022-01-24 LAB — CBC WITH DIFFERENTIAL/PLATELET
Abs Immature Granulocytes: 0.07 10*3/uL (ref 0.00–0.07)
Basophils Absolute: 0 10*3/uL (ref 0.0–0.1)
Basophils Relative: 0 %
Eosinophils Absolute: 0.4 10*3/uL (ref 0.0–0.5)
Eosinophils Relative: 5 %
HCT: 27.5 % — ABNORMAL LOW (ref 36.0–46.0)
Hemoglobin: 8 g/dL — ABNORMAL LOW (ref 12.0–15.0)
Immature Granulocytes: 1 %
Lymphocytes Relative: 35 %
Lymphs Abs: 2.9 10*3/uL (ref 0.7–4.0)
MCH: 28.1 pg (ref 26.0–34.0)
MCHC: 29.1 g/dL — ABNORMAL LOW (ref 30.0–36.0)
MCV: 96.5 fL (ref 80.0–100.0)
Monocytes Absolute: 0.7 10*3/uL (ref 0.1–1.0)
Monocytes Relative: 8 %
Neutro Abs: 4.2 10*3/uL (ref 1.7–7.7)
Neutrophils Relative %: 51 %
Platelets: 161 10*3/uL (ref 150–400)
RBC: 2.85 MIL/uL — ABNORMAL LOW (ref 3.87–5.11)
RDW: 19.8 % — ABNORMAL HIGH (ref 11.5–15.5)
WBC: 8.2 10*3/uL (ref 4.0–10.5)
nRBC: 0 % (ref 0.0–0.2)

## 2022-01-24 LAB — C-REACTIVE PROTEIN: CRP: 6.2 mg/dL — ABNORMAL HIGH (ref <1.0)

## 2022-01-24 LAB — BASIC METABOLIC PANEL
Anion gap: 3 — ABNORMAL LOW (ref 5–15)
BUN: 10 mg/dL (ref 8–23)
CO2: 35 mmol/L — ABNORMAL HIGH (ref 22–32)
Calcium: 9.7 mg/dL (ref 8.9–10.3)
Chloride: 102 mmol/L (ref 98–111)
Creatinine, Ser: 0.63 mg/dL (ref 0.44–1.00)
GFR, Estimated: >60 mL/min (ref >60)
Glucose, Bld: 86 mg/dL (ref 70–99)
Potassium: 3.9 mmol/L (ref 3.5–5.1)
Sodium: 140 mmol/L (ref 135–145)

## 2022-01-24 LAB — CK: Total CK: 17 U/L — ABNORMAL LOW (ref 38–234)

## 2022-01-24 LAB — SEDIMENTATION RATE: Sed Rate: 47 mm/hr — ABNORMAL HIGH (ref 0–22)

## 2022-01-24 SURGERY — ESOPHAGOGASTRODUODENOSCOPY (EGD) WITH PROPOFOL
Anesthesia: Monitor Anesthesia Care

## 2022-01-24 MED ORDER — SODIUM CHLORIDE 0.9 % IV SOLN: INTRAVENOUS | Status: DC

## 2022-01-24 MED ORDER — PROPOFOL 500 MG/50ML IV EMUL
INTRAVENOUS | Status: DC | PRN
  Administered 2022-01-24: 125 ug/kg/min via INTRAVENOUS

## 2022-01-24 MED ORDER — PROPOFOL 10 MG/ML IV BOLUS
INTRAVENOUS | Status: DC | PRN
  Administered 2022-01-24: 5 mg via INTRAVENOUS
  Administered 2022-01-24: 10 mg via INTRAVENOUS

## 2022-01-24 MED ORDER — LIDOCAINE 2% (20 MG/ML) 5 ML SYRINGE
INTRAMUSCULAR | Status: DC | PRN
  Administered 2022-01-24: 40 mg via INTRAVENOUS

## 2022-01-24 MED ORDER — LACTATED RINGERS IV SOLN
INTRAVENOUS | Status: AC | PRN
  Administered 2022-01-24: 1000 mL via INTRAVENOUS

## 2022-01-24 MED ORDER — PHENYLEPHRINE 40 MCG/ML (10ML) SYRINGE FOR IV PUSH (FOR BLOOD PRESSURE SUPPORT)
PREFILLED_SYRINGE | INTRAVENOUS | Status: DC | PRN
Start: 2022-01-24 — End: 2022-01-24
  Administered 2022-01-24: 80 ug via INTRAVENOUS
  Administered 2022-01-24: 40 ug via INTRAVENOUS

## 2022-01-24 MED ORDER — METHOCARBAMOL 1000 MG/10ML IJ SOLN
500.0000 mg | Freq: Four times a day (QID) | INTRAVENOUS | Status: DC
  Administered 2022-01-24 – 2022-01-27 (×13): 500 mg via INTRAVENOUS
  Filled 2022-01-24: qty 5
  Filled 2022-01-24: qty 500
  Filled 2022-01-24 (×6): qty 5
  Filled 2022-01-24: qty 500
  Filled 2022-01-24 (×5): qty 5
  Filled 2022-01-24 (×2): qty 500
  Filled 2022-01-24 (×2): qty 5

## 2022-01-24 MED ORDER — LACTATED RINGERS IV SOLN: INTRAVENOUS | Status: DC | PRN

## 2022-01-24 SURGICAL SUPPLY — 15 items

## 2022-01-24 NOTE — Progress Notes (Addendum)
Received floor nurse call about patient requesting Robaxin for pain/muscle spasms, Robaxin orders are for po. Patient is NPO for EGD today.  Paged Dr. Michail Sermon who is on-call for GI to clarify whether strict NPO of if sips with meds are allowed.  Still pending response.  Called unit Pharmacist to convert medication to IV, since pt has IV access.  For now, pt can receive medication by IV until EGD procedure is complete.   ?

## 2022-01-24 NOTE — Progress Notes (Signed)
Patient resting comfortably on home CPAP unit. No assistance needed from RT at this time. ?

## 2022-01-24 NOTE — Interval H&P Note (Signed)
History and Physical Interval Note: ? ?01/24/2022 ?11:05 AM ? ?Holly [name]  has presented today for surgery, with the diagnosis of melena.  The various methods of treatment have been discussed with the patient and family. After consideration of risks, benefits and other options for treatment, the patient has consented to  Procedure(s): ?ESOPHAGOGASTRODUODENOSCOPY (EGD) WITH PROPOFOL (N/A) as a surgical intervention.  The patient's history has been reviewed, patient examined, no change in status, stable for surgery.  I have reviewed the patient's chart and labs.  Questions were answered to the patient's satisfaction.   ? ? ?[name] ? ? ?

## 2022-01-24 NOTE — Progress Notes (Signed)
Arrived to room. Patient infusion not complete. Nurse to reconsult when infusion complete. Nurse notified and VU. Fran Lowes, RN VAST ?

## 2022-01-24 NOTE — Op Note (Signed)
[facility] ?Patient Name: Holly [name] ?Procedure Date : 01/24/2022 ?MRN: [id number] ?Attending MD: Arta Silence , MD ?Date of Birth: 10-22-1945 ?CSN: [id number] ?Age: 76 ?Admit Type: Inpatient ?Procedure:                Upper GI endoscopy ?Indications:              Acute post hemorrhagic anemia, Melena ?Providers:                Arta Silence, MD, Carmie End, RN, Janie  ?                          [place], Technician, Reather Laurence, CRNA ?Referring MD:             [facility] ?Medicines:                Monitored Anesthesia Care ?Complications:            No immediate complications. ?Estimated Blood Loss:     Estimated blood loss: none. ?Procedure:                Pre-Anesthesia Assessment: ?                          - Prior to the procedure, a History and Physical  ?                          was performed, and patient medications and  ?                          allergies were reviewed. The patient's tolerance of  ?                          previous anesthesia was also reviewed. The risks  ?                          and benefits of the procedure and the sedation  ?                          options and risks were discussed with the patient.  ?                          All questions were answered, and informed consent  ?                          was obtained. Prior Anticoagulants: The patient has  ?                          taken Eliquis (apixaban), last dose was 2 days  ?                          prior to procedure. ASA Grade Assessment: III - A  ?                          patient with severe systemic disease. After  ?  reviewing the risks and benefits, the patient was  ?                          deemed in satisfactory condition to undergo the  ?                          procedure. ?                          After obtaining informed consent, the endoscope was  ?                          passed under direct vision. Throughout the  ?                           procedure, the patient's blood pressure, pulse, and  ?                          oxygen saturations were monitored continuously. The  ?                          [id number]-[id number] ([id number]) Olympus endoscope was introduced  ?                          through the mouth, and advanced to the second part  ?                          of duodenum. The upper GI endoscopy was  ?                          accomplished without difficulty. The patient  ?                          tolerated the procedure well. ?Scope In: ?Scope Out: ?Findings: ?     The examined esophagus was normal. ?     Patchy moderate inflammation was found in the entire examined stomach. ?     A few dispersed small erosions with no stigmata of recent bleeding were  ?     found in the entire examined stomach. ?     The exam of the stomach was otherwise normal. ?     One oozing superficial duodenal ulcer with no stigmata of bleeding was  ?     found in the duodenal bulb. The lesion was 4 mm in largest dimension. To  ?     stop active bleeding, two hemostatic clips were successfully placed.  ?     There was no bleeding at the end of the procedure. ?     The exam of the duodenum was otherwise normal. ?     No old or fresh blood was seen to the extent of our examination. ?Impression:               - Normal esophagus. ?                          - Gastritis. ?                          -  Erosive gastropathy with no stigmata of recent  ?                          bleeding. ?                          - Oozing duodenal ulcer with no stigmata of  ?                          bleeding. Clips were placed. ?                          - No specimens collected. ?Recommendation:           - Return patient to hospital ward for ongoing care. ?                          - Clear liquid diet today. ?                          - IV PPI bid/gtt for at least the next 24 hours. ?                          - No Eliquis for next 7 days. Risk of rebleeding if  ?                          started back on  Eliquis in less than one week is  ?                          greater than 50%. ?                          - [facility] will follow. ?Procedure Code(s):        --- Professional --- ?                          02542, Esophagogastroduodenoscopy, flexible,  ?                          transoral; with control of bleeding, any method ?Diagnosis Code(s):        --- Professional --- ?                          K29.70, Gastritis, unspecified, without bleeding ?                          K31.89, Other diseases of stomach and duodenum ?                          K26.4, Chronic or unspecified duodenal ulcer with  ?                          hemorrhage ?                          D62, Acute posthemorrhagic anemia ?  K92.1, Melena (includes Hematochezia) ?CPT copyright 2019 [employer]. All rights reserved. ?The codes documented in this report are preliminary and upon coder review may  ?be revised to meet current compliance requirements. ?Arta Silence, MD ?01/24/2022 11:42:19 AM ?This report has been signed electronically. ?Number of Addenda: 0 ?

## 2022-01-24 NOTE — Anesthesia Procedure Notes (Signed)
Procedure Name: [place] ?Date/Time: 01/24/2022 11:16 AM ?Performed by: Janene Harvey, CRNA ?Pre-anesthesia Checklist: Patient identified, Emergency Drugs available, Suction available and Patient being monitored ?Patient Re-evaluated:Patient Re-evaluated prior to induction ?Oxygen Delivery Method: Nasal cannula ?Induction Type: IV induction ?Placement Confirmation: positive ETCO2 ?Dental Injury: Teeth and Oropharynx as per pre-operative assessment  ? ? ? ? ?

## 2022-01-24 NOTE — Anesthesia Postprocedure Evaluation (Signed)
Anesthesia Post Note ? ?Patient: Holly [name] ? ?Procedure(s) Performed: ESOPHAGOGASTRODUODENOSCOPY (EGD) WITH PROPOFOL ?HEMOSTASIS CLIP PLACEMENT ? ?  ? ?Patient location during evaluation: PACU ?Anesthesia Type: MAC ?Level of consciousness: awake and alert ?Pain management: pain level controlled ?Vital Signs Assessment: post-procedure vital signs reviewed and stable ?Respiratory status: spontaneous breathing, nonlabored ventilation, respiratory function stable and patient connected to nasal cannula oxygen ?Cardiovascular status: blood pressure returned to baseline and stable ?Postop Assessment: no apparent nausea or vomiting ?Anesthetic complications: no ? ? ?No notable events documented. ? ?Last Vitals:  ?Vitals:  ? 01/24/22 1200 01/24/22 1323  ?BP: (!) 102/52 116/61  ?Pulse: 62 69  ?Resp: (!) 21 17  ?Temp:  36.7 ?C  ?SpO2: 100% 100%  ?  ?Last Pain:  ?Vitals:  ? 01/24/22 1145  ?TempSrc:   ?PainSc: 0-No pain  ? ? ?  ?  ?  ?  ?  ?  ? ?Audry Pili ? ? ? ? ?

## 2022-01-24 NOTE — Transfer of Care (Signed)
Immediate Anesthesia Transfer of Care Note ? ?Patient: Holly [name] ? ?Procedure(s) Performed: ESOPHAGOGASTRODUODENOSCOPY (EGD) WITH PROPOFOL ?HEMOSTASIS CLIP PLACEMENT ? ?Patient Location: PACU ? ?Anesthesia Type:MAC ? ?Level of Consciousness: awake and patient cooperative ? ?Airway & Oxygen Therapy: Patient Spontanous Breathing and Patient connected to nasal cannula oxygen ? ?Post-op Assessment: Report given to RN and Post -op Vital signs reviewed and stable ? ?Post vital signs: Reviewed and stable ? ?Last Vitals:  ?Vitals Value Taken Time  ?BP    ?Temp    ?Pulse    ?Resp    ?SpO2    ? ? ?Last Pain:  ?Vitals:  ? 01/24/22 1030  ?TempSrc: Temporal  ?PainSc: 7   ?   ? ?Patients Stated Pain Goal: 1 (01/23/22 3976) ? ?Complications: No notable events documented. ?

## 2022-01-24 NOTE — Progress Notes (Signed)
?                                                       PROGRESS NOTE ? ? ?Subjective/Complaints: ? ?Hgb stable pt off ELiquis, had EGD today showing evidence of bleeding duodenal ulcer, per GI needs IV PPI at least 2 d , hold Eliquis at least 1 wk and monitor Hgb closely  ? ?ROS: ? ?Pt denies SOB, CP, N/V/C/D, and vision changes, no abd pain  ? ?Objective: ?  ?No results found. ?Recent Labs  ?  01/22/22 ?1205 01/23/22 ?0318 01/24/22 ?0430  ?WBC 8.2  --  8.2  ?HGB 7.9* 7.7* 8.0*  ?HCT 27.5* 26.3* 27.5*  ?PLT 195  --  161  ? ? ?Recent Labs  ?  01/24/22 ?0430  ?NA 140  ?K 3.9  ?CL 102  ?CO2 35*  ?GLUCOSE 86  ?BUN 10  ?CREATININE 0.63  ?CALCIUM 9.7  ? ? ? ?Intake/Output Summary (Last 24 hours) at 01/24/2022 0912 ?Last data filed at 01/24/2022 0459 ?Gross per 24 hour  ?Intake 480 ml  ?Output 1300 ml  ?Net -820 ml  ? ?  ? ?Pressure Injury 01/06/22 Sacrum Unstageable - Full thickness tissue loss in which the base of the injury is covered by slough (yellow, tan, gray, green or brown) and/or eschar (tan, brown or black) in the wound bed. yellow slough covering wound base (Active)  ?01/06/22 1603  ?Location: Sacrum  ?Location Orientation:   ?Staging: Unstageable - Full thickness tissue loss in which the base of the injury is covered by slough (yellow, tan, gray, green or brown) and/or eschar (tan, brown or black) in the wound bed.  ?Wound Description (Comments): yellow slough covering wound base  ?Present on Admission: Yes  ? ? ?Physical Exam: ?Vital Signs ?Blood pressure 116/63, pulse (!) 59, temperature 97.8 ?F (36.6 ?C), temperature source Oral, resp. rate 16, height 5' (1.524 m), weight 51.7 kg, SpO2 98 %. ? ? ?General: No acute distress ?Mood and affect are appropriate ?Heart: Regular rate and rhythm no rubs murmurs or extra sounds ?Lungs: Clear to auscultation, breathing unlabored, no rales or wheezes ?Abdomen: Positive bowel sounds, soft nontender to palpation, nondistended ?Extremities: No clubbing, cyanosis, or  edema ?Skin: No evidence of breakdown, no evidence of rash ? ? ?  General: Skin is warm and dry.  ?   Comments: Midline incision and left lower extremity incisions healing without signs of infection , dressing removed, drain sites healed without drainage  ?Neurological:  ?   Mental Status: She is alert and oriented to person, place, and time.  ?   Motor: Weakness present.  ?   Comments: Weak dorsiflexion of right foot  ? ?Assessment/Plan: ?1. Functional deficits which require 3+ hours per day of interdisciplinary therapy in a comprehensive inpatient rehab setting. ?Physiatrist is providing close team supervision and 24 hour management of active medical problems listed below. ?Physiatrist and rehab team continue to assess barriers to discharge/monitor patient progress toward functional and medical goals ? ?Care Tool: ? ?Bathing ?   ?Body parts bathed by patient: Right arm, Left arm, Chest, Abdomen, Front perineal area, Buttocks, Face, Right upper leg, Left upper leg, Left lower leg, Right lower leg  ? Body parts bathed by helper: Right lower leg, Left lower leg ?  ?  ?Bathing assist Assist Level:  Supervision/Verbal cueing ?  ?  ?Upper Body Dressing/Undressing ?Upper body dressing   ?What is the patient wearing?: Pull over shirt ?   ?Upper body assist Assist Level: Supervision/Verbal cueing ?   ?Lower Body Dressing/Undressing ?Lower body dressing ? ? ?   ?What is the patient wearing?: Pants ? ?  ? ?Lower body assist Assist for lower body dressing: Contact Guard/Touching assist ?   ? ?Toileting ?Toileting    ?Toileting assist Assist for toileting: Independent with assistive device ?  ?  ?Transfers ?Chair/bed transfer ? ?Transfers assist ?   ? ?Chair/bed transfer assist level: Supervision/Verbal cueing ?  ?  ?Locomotion ?Ambulation ? ? ?Ambulation assist ? ? Ambulation activity did not occur: Safety/medical concerns ? ?  ?  ?   ? ?Walk 10 feet activity ? ? ?Assist ? Walk 10 feet activity did not occur: Safety/medical  concerns ? ?  ?   ? ?Walk 50 feet activity ? ? ?Assist Walk 50 feet with 2 turns activity did not occur: Safety/medical concerns ? ?  ?   ? ? ?Walk 150 feet activity ? ? ?Assist Walk 150 feet activity did not occur: Safety/medical concerns ? ?  ?  ?  ? ?Walk 10 feet on uneven surface  ?activity ? ? ?Assist Walk 10 feet on uneven surfaces activity did not occur: Safety/medical concerns ? ? ?  ?   ? ?Wheelchair ? ? ? ? ?Assist Is the patient using a wheelchair?: Yes ?Type of Wheelchair: Manual ?  ? ?Wheelchair assist level: Independent ?Max wheelchair distance: 3'  ? ? ?Wheelchair 50 feet with 2 turns activity ? ? ? ?Assist ? ?  ?  ? ? ?Assist Level: Independent  ? ?Wheelchair 150 feet activity  ? ? ? ?Assist ?   ? ? ?Assist Level: Moderate Assistance - Patient 50 - 74%  ? ?Blood pressure 116/63, pulse (!) 59, temperature 97.8 ?F (36.6 ?C), temperature source Oral, resp. rate 16, height 5' (1.524 m), weight 51.7 kg, SpO2 98 %. ? ?Medical Problem List and Plan: ?1. Functional deficits secondary to perforated diverticulum, exploratory laparotomy with colostomy, recent tibial plateau fracture repair. ?            -patient may shower but colostomy must be covered ?            -ELOS/Goals: MinA/S 12-16 days     ?           Plan for endoscopy Monday due to possible GI bleed.  ?2.  Antithrombotics: ?-DVT/anticoagulation:  Pharmaceutical: Other (comment)Eliquis for 3 months for right axillary vein DVT. Eliquis held 4/8 , per GI would not resume sooner than 4/17 (7d from today ) ?            -antiplatelet therapy: none ?3. Pain: continue Tylenol, oxycodone PRN ?            --continue Robaxin 500 mg q 6 hours ?4. Mood: LCSW to evaluate and provide emotional support ?            -continue Wellbutrin XL 300 mg q AM ?            -continue Buspar 5 mg BID ?            --continue Xanax 0.5 mg prn anxiety ?            -antipsychotic agents: n/a ?5. Neuropsych: This patient is capable of making decisions on her own behalf. ?6.  Skin/Wound Care: Routine skin care checks ?            --  Monitor midline incision, LLE incisions ?            -- Routine stoma care ?            --Monitor RUE bullous lesions ?  ? 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 ?10: COPD: continue chronic prednisone 20 mg daily ?            -continue Breo Ellipta + Incruse Ellipta ?            --albuterol nebs prn ? 3/30- on O2 3L -home O2- con't ?11: OSA: continue CPAP nightly; ? O2 bleed in ?12: Recent repair of left tibial plateau fracture with bone biopsies ?            --s/p ORIF by Dr Doreatha Martin 12/13/2021 ?            --weight bearing with transfers only; unrestricted ROM ?            --bone culture +MRSA ?            --continue daptomycin through 4/26 ?            --follow-up with ID ? ?13: Hypertension: continue Toprol XL 50 mg daily ?Vitals:  ? 01/23/22 2103 01/24/22 0457  ?BP: (!) 107/55 116/63  ?Pulse: 63 (!) 59  ?Resp: 18 16  ?Temp: 97.9 ?F (36.6 ?C) 97.8 ?F (36.6 ?C)  ?SpO2: 100% 98%  ? 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 ?  Latest Ref Rng & Units 01/24/2022  ?  4:30 AM 01/19/2022  ?  5:28 AM 01/17/2022  ?  5:14 AM  ?BMP  ?Glucose 70 - 99 mg/dL 86   92   97    ?BUN 8 - 23 mg/dL 10   12   10     ?Creatinine 0.44 - 1.00 mg/dL 0.63   0.68   0.65    ?Sodium 135 - 145 mmol/L 140   141   137    ?Potassium 3.5 - 5.1 mmol/L 3.9   3.5   4.0    ?Chloride 98 - 111 mmol/L 102   98   100    ?CO2 22 - 32 mmol/L 35   39   30    ?Calcium 8.9 - 10.3 mg/dL 9.7   9.6   9.0    ? ?Renal fx stable on daily  cozaar x 6d  ?15: Hypothyroidism: continue Synthroid ?16: ? Cognitive decline: on Aricept ?17: Hyperlipidemia: continue Lipitor ?18: Insomnia/poor sleep hygiene: continue Remeron 30 mg q HS ? 3/31- sleeping stable- con't regimen ?19: Right  foot weakness: PRAFO boot ?20: Glossitis, possible thrush: she is requesting Nystatin s/s ?21. Dysuria/Yeast UTI- and urgency possible UTI ? 3/30- will check U/A and Cx- checked 3/27- but could be positive now. Will

## 2022-01-24 NOTE — Progress Notes (Signed)
Received call back from Dr. Michail Sermon MD, informed him of IV Robaxin given by floor nurse. ?

## 2022-01-24 NOTE — Progress Notes (Signed)
Patient using home CPAP machine. ?

## 2022-01-24 NOTE — Anesthesia Preprocedure Evaluation (Addendum)
Anesthesia Evaluation  ?Patient identified by MRN, date of birth, ID band ?Patient awake ? ? ? ?Reviewed: ?Allergy & Precautions, NPO status , Patient's Chart, lab work & pertinent test results ? ?History of Anesthesia Complications ?Negative for: history of anesthetic complications ? ?Airway ?Mallampati: III ? ?TM Distance: >3 FB ?Neck ROM: Full ? ? ? Dental ? ?(+) Lower Dentures, Upper Dentures ?  ?Pulmonary ?shortness of breath and Long-Term Oxygen Therapy, asthma , sleep apnea and Continuous Positive Airway Pressure Ventilation , COPD,  COPD inhaler and oxygen dependent, former smoker,  ?  ?Pulmonary exam normal ? ? ? ? ? ? ? Cardiovascular ?hypertension, Pt. on home beta blockers and Pt. on medications ?Normal cardiovascular exam ? ? ?'22 TTE - EF 65 to 70%. Grade I diastolic dysfunction (impaired relaxation). Trivial mitral valve regurgitation. ? ?  ?Neuro/Psych ? Headaches, PSYCHIATRIC DISORDERS Anxiety Depression   ? GI/Hepatic ?Neg liver ROS, GERD  Controlled,  ?Endo/Other  ?Hypothyroidism  ? Renal/GU ?negative Renal ROS  ?Female GU complaint ? ? ?  ?Musculoskeletal ?negative musculoskeletal ROS ?(+)  ? Abdominal ?  ?Peds ? Hematology ? ?(+) Blood dyscrasia, anemia ,  ?On eliquis ?   ?Anesthesia Other Findings ? ? Reproductive/Obstetrics ? ?  ? ? ? ? ? ? ? ? ? ? ? ? ? ?  ?  ? ? ? ? ? ? ?Anesthesia Physical ?Anesthesia Plan ? ?ASA: 3 ? ?Anesthesia Plan: MAC  ? ?Post-op Pain Management: Minimal or no pain anticipated  ? ?Induction:  ? ?PONV Risk Score and Plan: 2 and Propofol infusion and Treatment may vary due to age or medical condition ? ?Airway Management Planned: Nasal Cannula and Natural Airway ? ?Additional Equipment: None ? ?Intra-op Plan:  ? ?Post-operative Plan:  ? ?Informed Consent: I have reviewed the patients History and Physical, chart, labs and discussed the procedure including the risks, benefits and alternatives for the proposed anesthesia with the patient or  authorized representative who has indicated his/her understanding and acceptance.  ? ?Patient has DNR.  ? ? ? ?Plan Discussed with: CRNA and Anesthesiologist ? ?Anesthesia Plan Comments:   ? ? ? ? ? ?Anesthesia Quick Evaluation ? ?

## 2022-01-24 NOTE — Plan of Care (Signed)
?  Problem: RH Car Transfers ?Goal: LTG Patient will perform car transfers with assist (PT) ?Description: LTG: Patient will perform car transfers with assistance (PT). ?Outcome: Completed/Met ?  ?

## 2022-01-25 ENCOUNTER — Encounter (HOSPITAL_COMMUNITY): Payer: Self-pay | Admitting: Gastroenterology

## 2022-01-25 LAB — CBC WITH DIFFERENTIAL/PLATELET
Abs Immature Granulocytes: 0.05 10*3/uL (ref 0.00–0.07)
Basophils Absolute: 0 10*3/uL (ref 0.0–0.1)
Basophils Relative: 0 %
Eosinophils Absolute: 0.2 10*3/uL (ref 0.0–0.5)
Eosinophils Relative: 2 %
HCT: 26.7 % — ABNORMAL LOW (ref 36.0–46.0)
Hemoglobin: 7.9 g/dL — ABNORMAL LOW (ref 12.0–15.0)
Immature Granulocytes: 1 %
Lymphocytes Relative: 29 %
Lymphs Abs: 2.4 10*3/uL (ref 0.7–4.0)
MCH: 28.6 pg (ref 26.0–34.0)
MCHC: 29.6 g/dL — ABNORMAL LOW (ref 30.0–36.0)
MCV: 96.7 fL (ref 80.0–100.0)
Monocytes Absolute: 0.6 10*3/uL (ref 0.1–1.0)
Monocytes Relative: 7 %
Neutro Abs: 5.1 10*3/uL (ref 1.7–7.7)
Neutrophils Relative %: 61 %
Platelets: 156 10*3/uL (ref 150–400)
RBC: 2.76 MIL/uL — ABNORMAL LOW (ref 3.87–5.11)
RDW: 19.8 % — ABNORMAL HIGH (ref 11.5–15.5)
WBC: 8.3 10*3/uL (ref 4.0–10.5)
nRBC: 0 % (ref 0.0–0.2)

## 2022-01-25 MED ORDER — LIP MEDEX EX OINT
TOPICAL_OINTMENT | CUTANEOUS | Status: DC | PRN
  Filled 2022-01-25: qty 7

## 2022-01-25 MED ORDER — BLISTEX MEDICATED EX OINT: TOPICAL_OINTMENT | CUTANEOUS | Status: DC | PRN

## 2022-01-25 NOTE — Progress Notes (Signed)
Patient does not need assistance with home CPAP.  RT available if needed. ?

## 2022-01-25 NOTE — Progress Notes (Signed)
Subjective: ?No further bleeding. ?Tolerating clear liquids. ?No abdominal pain. ? ?Objective: ?Vital signs in last 24 hours: ?Temp:  [97.7 ?F (36.5 ?C)-98.4 ?F (36.9 ?C)] 97.8 ?F (36.6 ?C) (04/11 0615) ?Pulse Rate:  [61-76] 74 (04/11 0900) ?Resp:  [14-21] 14 (04/11 0615) ?BP: (102-121)/(51-61) 116/52 (04/11 0900) ?SpO2:  [96 %-100 %] 100 % (04/11 0615) ?Weight change: -0.01 kg ?Last BM Date : 01/24/22 ? ?PE: ?GEN:  NAD ?ABD:  Soft, non-tender ?SKIN:  Scattered ecchymoses, telangiectasias ? ?Lab Results: ?CBC ?   ?Component Value Date/Time  ? WBC 8.3 01/25/2022 0500  ? RBC 2.76 (L) 01/25/2022 0500  ? HGB 7.9 (L) 01/25/2022 0500  ? HGB 9.7 (L) 12/09/2021 1050  ? HCT 26.7 (L) 01/25/2022 0500  ? PLT 156 01/25/2022 0500  ? PLT 224 12/09/2021 1050  ? MCV 96.7 01/25/2022 0500  ? MCH 28.6 01/25/2022 0500  ? MCHC 29.6 (L) 01/25/2022 0500  ? RDW 19.8 (H) 01/25/2022 0500  ? LYMPHSABS 2.4 01/25/2022 0500  ? MONOABS 0.6 01/25/2022 0500  ? EOSABS 0.2 01/25/2022 0500  ? BASOSABS 0.0 01/25/2022 0500  ?CMP  ?   ?Component Value Date/Time  ? NA 140 01/24/2022 0430  ? K 3.9 01/24/2022 0430  ? CL 102 01/24/2022 0430  ? CO2 35 (H) 01/24/2022 0430  ? GLUCOSE 86 01/24/2022 0430  ? BUN 10 01/24/2022 0430  ? CREATININE 0.63 01/24/2022 0430  ? CREATININE 0.87 12/09/2021 1050  ? CALCIUM 9.7 01/24/2022 0430  ? PROT 4.7 (L) 01/13/2022 0507  ? ALBUMIN 1.6 (L) 01/13/2022 0507  ? AST 16 01/13/2022 0507  ? AST 12 (L) 12/09/2021 1050  ? ALT 9 01/13/2022 0507  ? ALT 12 12/09/2021 1050  ? ALKPHOS 83 01/13/2022 0507  ? BILITOT 0.6 01/13/2022 0507  ? BILITOT 0.4 12/09/2021 1050  ? GFRNONAA >60 01/24/2022 0430  ? GFRNONAA >60 12/09/2021 1050  ? GFRAA 58 (L) 05/14/2020 1309  ? ?Assessment: ? ? Melena, resolved. ?Acute blood loss anemia. ?Bleeding duodenal ulcer, clipped x 2 yesterday, no recurrent bleeding ? ?Plan: ? ? No Eliquis for at least the next 7 days. ?Daily CBC. ?Pantoprazole 40 mg IV Q 12 hours today, would transition to 40 mg po bid tomorrow.   Continue 40 mg po bid x 6 weeks, then go down to 40 mg po qd thereafter indefinitely. ?Discussed at length avoidance of NSAIDs to decrease risk of further ulcer bleeding. ?Soft diet, advance as tolerated. ?[facility] will sign-off; please call with questions; case reviewed with patient (in person) and her daughter [name] (on telephone); thank you for the consultation. ? ? [name] ?01/25/2022, 11:35 AM ? ? ?Cell [phone] ?If no answer or after 5 PM call [phone]  ?

## 2022-01-25 NOTE — Patient Care Conference (Signed)
Inpatient RehabilitationTeam Conference and Plan of Care Update ?Date: 01/25/2022   Time: 12:15 PM  ? ? ?Patient Name: Holly [name]      ?Medical Record Number: [id number]  ?Date of Birth: 1946/04/15 ?Sex: Female         ?Room/Bed: 5C04C/5C04C-01 ?Payor Info: Payor: MEDICARE / Plan: MEDICARE PART A AND B / Product Type: *No Product type* /   ? ?Admit Date/Time:  01/12/2022  5:13 PM ? ?Primary Diagnosis:  Debility ? ?Hospital Problems: Principal Problem: ?  Debility ? ? ? ?Expected Discharge Date: Expected Discharge Date: 01/27/22 ? ?Team Members Present: ?Physician leading conference: Dr. Leeroy Cha ?Social Worker Present: Ovidio Kin, LCSW ?Nurse Present: Dorien Chihuahua, RN ?PT Present: Ginnie Smart, PT ?OT Present: Meriel Pica, OT ? ?   Current Status/Progress Goal Weekly Team Focus  ?Bowel/Bladder ? ?   Ostomy ?Continent of bladder        ?Swallow/Nutrition/ Hydration ? ?           ?ADL's ? ?           ?Mobility ? ?           ?Communication ? ?           ?Safety/Cognition/ Behavioral Observations ?           ?Pain ? ?   Intermittent abd pain managed        ?Skin ? ?   DTI on sacrum w dressing, mid abd incision and LLQ stoma/ostomy   Patient and daughter able to complete care as ordered   Wound and skin care  ? ? ?Discharge Planning:  ?Met goals here for medical issues-daughter was here last weekend for education and this has been completed. Equipment delivered-wheelchair. Ready for DC once cleared medically   ?Team Discussion: ?Patient awaiting medical clearance for discharge. ? ?Patient on target to meet rehab goals: ?yes ? ?*See Care Plan and progress notes for long and short-term goals.  ? ?Revisions to Treatment Plan:  ?N/A ?  ?Teaching Needs: ?Patient and family education completed ?  ?Current Barriers to Discharge: ?Decreased caregiver support and IV abx. ? ?Possible Resolutions to Barriers: ?Family education ?HH follow up services and nursing/aide ?DME: W/C ?  ? ? Medical Summary ?Current Status:  Dark stool noted via colostomy, GI work-up revealed duodenal ulcer will need IV Protonix for at least 2 more days as well as monitoring of CBC on a daily basis. ? Barriers to Discharge: Medical stability;Other (comments) ? Barriers to Discharge Comments: GI bleed ?Possible Resolutions to [employer] Focus: Appreciate gastroenterology help, hope to discharge in 2 to 3 days if medically stable ? ? ?Continued Need for Acute Rehabilitation Level of Care: The patient requires daily medical management by a physician with specialized training in physical medicine and rehabilitation for the following reasons: ?Direction of a multidisciplinary physical rehabilitation program to maximize functional independence : Yes ?Medical management of patient stability for increased activity during participation in an intensive rehabilitation regime.: Yes ?Analysis of laboratory values and/or radiology reports with any subsequent need for medication adjustment and/or medical intervention. : Yes ? ? ?I attest that I was present, lead the team conference, and concur with the assessment and plan of the team. ? ? ?Dorien Chihuahua B ?01/25/2022, 1:15 PM  ? ? ? ? ? ? ?

## 2022-01-25 NOTE — Progress Notes (Signed)
Patient ID: Holly [name], female   DOB: [birth year], 76 y.o.   MRN: [id number]  Met with pt who is aware GI is deciding when she is medically ready for discharge home. She heard Thursday will await input from them. Has met her goals of rehab and daughter has been educated on her care for home. Hopefully will discharge Thursday ?

## 2022-01-26 DIAGNOSIS — R5381 Other malaise: Secondary | ICD-10-CM | POA: Diagnosis not present

## 2022-01-26 LAB — CBC WITH DIFFERENTIAL/PLATELET
Abs Immature Granulocytes: 0.07 10*3/uL (ref 0.00–0.07)
Basophils Absolute: 0 10*3/uL (ref 0.0–0.1)
Basophils Relative: 0 %
Eosinophils Absolute: 0.3 10*3/uL (ref 0.0–0.5)
Eosinophils Relative: 3 %
HCT: 27.4 % — ABNORMAL LOW (ref 36.0–46.0)
Hemoglobin: 8.1 g/dL — ABNORMAL LOW (ref 12.0–15.0)
Immature Granulocytes: 1 %
Lymphocytes Relative: 33 %
Lymphs Abs: 2.6 10*3/uL (ref 0.7–4.0)
MCH: 28.5 pg (ref 26.0–34.0)
MCHC: 29.6 g/dL — ABNORMAL LOW (ref 30.0–36.0)
MCV: 96.5 fL (ref 80.0–100.0)
Monocytes Absolute: 0.6 10*3/uL (ref 0.1–1.0)
Monocytes Relative: 7 %
Neutro Abs: 4.4 10*3/uL (ref 1.7–7.7)
Neutrophils Relative %: 56 %
Platelets: 141 10*3/uL — ABNORMAL LOW (ref 150–400)
RBC: 2.84 MIL/uL — ABNORMAL LOW (ref 3.87–5.11)
RDW: 20 % — ABNORMAL HIGH (ref 11.5–15.5)
WBC: 7.9 10*3/uL (ref 4.0–10.5)
nRBC: 0 % (ref 0.0–0.2)

## 2022-01-26 MED ORDER — PANTOPRAZOLE SODIUM 40 MG PO TBEC
40.0000 mg | DELAYED_RELEASE_TABLET | Freq: Two times a day (BID) | ORAL | Status: DC
  Administered 2022-01-26 – 2022-01-27 (×3): 40 mg via ORAL
  Filled 2022-01-26 (×3): qty 1

## 2022-01-26 NOTE — Progress Notes (Signed)
?                                                       PROGRESS NOTE ? ? ?Subjective/Complaints: ? ?Hgb stable pt off ELiquis, had EGD on Monday  showing evidence of bleeding duodenal ulcer, per GI ok to change PPI to po  hold Eliquis at least 1 wk and monitor Hgb closely  ? ?ROS: ? ?Pt denies SOB, CP, N/V/C/D, and vision changes, no abd pain  ? ?Objective: ?  ?No results found. ?Recent Labs  ?  01/25/22 ?0500 01/26/22 ?0422  ?WBC 8.3 7.9  ?HGB 7.9* 8.1*  ?HCT 26.7* 27.4*  ?PLT 156 141*  ? ? ?Recent Labs  ?  01/24/22 ?0430  ?NA 140  ?K 3.9  ?CL 102  ?CO2 35*  ?GLUCOSE 86  ?BUN 10  ?CREATININE 0.63  ?CALCIUM 9.7  ? ? ? ? ?Intake/Output Summary (Last 24 hours) at 01/26/2022 2878 ?Last data filed at 01/26/2022 0848 ?Gross per 24 hour  ?Intake 180 ml  ?Output 650 ml  ?Net -470 ml  ? ?  ? ?Pressure Injury 01/06/22 Sacrum Unstageable - Full thickness tissue loss in which the base of the injury is covered by slough (yellow, tan, gray, green or brown) and/or eschar (tan, brown or black) in the wound bed. yellow slough covering wound base (Active)  ?01/06/22 1603  ?Location: Sacrum  ?Location Orientation:   ?Staging: Unstageable - Full thickness tissue loss in which the base of the injury is covered by slough (yellow, tan, gray, green or brown) and/or eschar (tan, brown or black) in the wound bed.  ?Wound Description (Comments): yellow slough covering wound base  ?Present on Admission: Yes  ? ? ?Physical Exam: ?Vital Signs ?Blood pressure (!) 106/50, pulse 62, temperature 98.7 ?F (37.1 ?C), temperature source Oral, resp. rate 16, height 5' (1.524 m), weight 63.5 kg, SpO2 100 %. ? ? ?General: No acute distress ?Mood and affect are appropriate ?Heart: Regular rate and rhythm no rubs murmurs or extra sounds ?Lungs: Clear to auscultation, breathing unlabored, no rales or wheezes ?Abdomen: Positive bowel sounds, soft nontender to palpation, nondistended ?Extremities: No clubbing, cyanosis, or edema ?Skin: No evidence of  breakdown, no evidence of rash ? ? ?  General: Skin is warm and dry.  ?   Comments: Midline incision and left lower extremity incisions healing without signs of infection , dressing removed, drain sites healed without drainage  ?Neurological:  ?   Mental Status: She is alert and oriented to person, place, and time.  ?   Motor: Weakness present.  ?   Comments: Weak dorsiflexion of right foot  ? ?Assessment/Plan: ?1. Functional deficits which require 3+ hours per day of interdisciplinary therapy in a comprehensive inpatient rehab setting. ?Physiatrist is providing close team supervision and 24 hour management of active medical problems listed below. ?Physiatrist and rehab team continue to assess barriers to discharge/monitor patient progress toward functional and medical goals ? ?Care Tool: ? ?Bathing ?   ?Body parts bathed by patient: Right arm, Left arm, Chest, Abdomen, Front perineal area, Buttocks, Face, Right upper leg, Left upper leg, Left lower leg, Right lower leg  ? Body parts bathed by helper: Right lower leg, Left lower leg ?  ?  ?Bathing assist Assist Level: Supervision/Verbal cueing ?  ?  ?Upper Body  Dressing/Undressing ?Upper body dressing   ?What is the patient wearing?: Pull over shirt ?   ?Upper body assist Assist Level: Supervision/Verbal cueing ?   ?Lower Body Dressing/Undressing ?Lower body dressing ? ? ?   ?What is the patient wearing?: Pants ? ?  ? ?Lower body assist Assist for lower body dressing: Contact Guard/Touching assist ?   ? ?Toileting ?Toileting    ?Toileting assist Assist for toileting: Independent with assistive device ?  ?  ?Transfers ?Chair/bed transfer ? ?Transfers assist ?   ? ?Chair/bed transfer assist level: Supervision/Verbal cueing ?  ?  ?Locomotion ?Ambulation ? ? ?Ambulation assist ? ? Ambulation activity did not occur: Safety/medical concerns ? ?  ?  ?   ? ?Walk 10 feet activity ? ? ?Assist ? Walk 10 feet activity did not occur: Safety/medical concerns ? ?  ?   ? ?Walk 50  feet activity ? ? ?Assist Walk 50 feet with 2 turns activity did not occur: Safety/medical concerns ? ?  ?   ? ? ?Walk 150 feet activity ? ? ?Assist Walk 150 feet activity did not occur: Safety/medical concerns ? ?  ?  ?  ? ?Walk 10 feet on uneven surface  ?activity ? ? ?Assist Walk 10 feet on uneven surfaces activity did not occur: Safety/medical concerns ? ? ?  ?   ? ?Wheelchair ? ? ? ? ?Assist Is the patient using a wheelchair?: Yes ?Type of Wheelchair: Manual ?  ? ?Wheelchair assist level: Independent ?Max wheelchair distance: 51'  ? ? ?Wheelchair 50 feet with 2 turns activity ? ? ? ?Assist ? ?  ?  ? ? ?Assist Level: Independent  ? ?Wheelchair 150 feet activity  ? ? ? ?Assist ?   ? ? ?Assist Level: Moderate Assistance - Patient 50 - 74%  ? ?Blood pressure (!) 106/50, pulse 62, temperature 98.7 ?F (37.1 ?C), temperature source Oral, resp. rate 16, height 5' (1.524 m), weight 63.5 kg, SpO2 100 %. ? ?Medical Problem List and Plan: ?1. Functional deficits secondary to perforated diverticulum, exploratory laparotomy with colostomy, recent tibial plateau fracture repair. ?            -patient may shower but colostomy must be covered ?            -ELOS/Goals: Plan D/C in am, if Hgb remains stable on oral PPI  ?  ?            ?2.  Antithrombotics: ?-DVT/anticoagulation:  Pharmaceutical: Other (comment)Eliquis for 3 months for right axillary vein DVT. Eliquis held 4/8 , per GI would not resume sooner than 4/17             -antiplatelet therapy: none ?3. Pain: continue Tylenol, oxycodone PRN ?            --continue Robaxin 500 mg q 6 hours ?4. Mood: LCSW to evaluate and provide emotional support ?            -continue Wellbutrin XL 300 mg q AM ?            -continue Buspar 5 mg BID ?            --continue Xanax 0.5 mg prn anxiety ?            -antipsychotic agents: n/a ?5. Neuropsych: This patient is capable of making decisions on her own behalf. ?6. Skin/Wound Care: Routine skin care checks ?            -- Monitor  midline incision, LLE incisions ?            -- Routine stoma care ?            --Monitor RUE bullous lesions ?  ? 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 ?10: COPD: continue chronic prednisone 20 mg daily ?            -continue Breo Ellipta + Incruse Ellipta ?            --albuterol nebs prn ? 3/30- on O2 3L -home O2- con't ?11: OSA: continue CPAP nightly; ? O2 bleed in ?12: Recent repair of left tibial plateau fracture with bone biopsies ?            --s/p ORIF by Dr Doreatha Martin 12/13/2021 ?            --weight bearing with transfers only; unrestricted ROM ?            --bone culture +MRSA ?            --continue daptomycin through 4/26 ?            --follow-up with ID ? ?13: Hypertension: continue Toprol XL 50 mg daily ?Vitals:  ? 01/25/22 1849 01/26/22 0722  ?BP: (!) 106/50   ?Pulse: 61 62  ?Resp: 16 16  ?Temp: 98.7 ?F (37.1 ?C)   ?SpO2: 100% 100%  ? 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 ?  Latest Ref Rng & Units 01/24/2022  ?  4:30 AM 01/19/2022  ?  5:28 AM 01/17/2022  ?  5:14 AM  ?BMP  ?Glucose 70 - 99 mg/dL 86   92   97    ?BUN 8 - 23 mg/dL 10   12   10     ?Creatinine 0.44 - 1.00 mg/dL 0.63   0.68   0.65    ?Sodium 135 - 145 mmol/L 140   141   137    ?Potassium 3.5 - 5.1 mmol/L 3.9   3.5   4.0    ?Chloride 98 - 111 mmol/L 102   98   100    ?CO2 22 - 32 mmol/L 35   39   30    ?Calcium 8.9 - 10.3 mg/dL 9.7   9.6   9.0    ? ?Renal fx stable on daily  cozaar x 6d  ?15: Hypothyroidism: continue Synthroid ?16: ? Cognitive decline: on Aricept ?17: Hyperlipidemia: continue Lipitor ?18: Insomnia/poor sleep hygiene: continue Remeron 30 mg q HS ? 3/31- sleeping stable- con't regimen ?19: Right foot weakness: PRAFO boot ?20: Glossitis, possible thrush: she is requesting Nystatin s/s ?21.  Dysuria/Yeast UTI- and urgency possible UTI ? 3/30- will check U/A and Cx- checked 3/27- but could be positive now. Will allow Purewick at night for now.  ? 3/31- is yeast UTI- will give Diflucan 200 mg x1 and 100 m

## 2022-01-27 DIAGNOSIS — R5381 Other malaise: Secondary | ICD-10-CM | POA: Diagnosis not present

## 2022-01-27 LAB — CBC WITH DIFFERENTIAL/PLATELET
Abs Immature Granulocytes: 0.1 10*3/uL — ABNORMAL HIGH (ref 0.00–0.07)
Basophils Absolute: 0 10*3/uL (ref 0.0–0.1)
Basophils Relative: 0 %
Eosinophils Absolute: 0.3 10*3/uL (ref 0.0–0.5)
Eosinophils Relative: 3 %
HCT: 27.5 % — ABNORMAL LOW (ref 36.0–46.0)
Hemoglobin: 8.1 g/dL — ABNORMAL LOW (ref 12.0–15.0)
Immature Granulocytes: 1 %
Lymphocytes Relative: 32 %
Lymphs Abs: 3.3 10*3/uL (ref 0.7–4.0)
MCH: 28.7 pg (ref 26.0–34.0)
MCHC: 29.5 g/dL — ABNORMAL LOW (ref 30.0–36.0)
MCV: 97.5 fL (ref 80.0–100.0)
Monocytes Absolute: 0.7 10*3/uL (ref 0.1–1.0)
Monocytes Relative: 7 %
Neutro Abs: 5.8 10*3/uL (ref 1.7–7.7)
Neutrophils Relative %: 57 %
Platelets: 176 10*3/uL (ref 150–400)
RBC: 2.82 MIL/uL — ABNORMAL LOW (ref 3.87–5.11)
RDW: 20.1 % — ABNORMAL HIGH (ref 11.5–15.5)
WBC: 10.2 10*3/uL (ref 4.0–10.5)
nRBC: 0 % (ref 0.0–0.2)

## 2022-01-27 MED ORDER — GERHARDT'S BUTT CREAM: 1.0000 "application " | TOPICAL_CREAM | Freq: Two times a day (BID) | CUTANEOUS | 0 refills | Status: DC

## 2022-01-27 MED ORDER — MEDIHONEY WOUND/BURN DRESSING EX PSTE
1.0000 | PASTE | Freq: Every day | CUTANEOUS | 1 refills | Status: AC
Start: 2022-01-28 — End: ?

## 2022-01-27 MED ORDER — METOPROLOL SUCCINATE ER 25 MG PO TB24
25.0000 mg | ORAL_TABLET | Freq: Every day | ORAL | Status: DC
Start: 2022-01-28 — End: 2022-01-27

## 2022-01-27 MED ORDER — APIXABAN 5 MG PO TABS: 5.0000 mg | ORAL_TABLET | Freq: Two times a day (BID) | ORAL | 2 refills | Status: AC

## 2022-01-27 MED ORDER — MIRTAZAPINE 30 MG PO TABS: 30.0000 mg | ORAL_TABLET | Freq: Every day | ORAL | 0 refills | Status: AC

## 2022-01-27 MED ORDER — PANTOPRAZOLE SODIUM 40 MG PO TBEC: 40.0000 mg | DELAYED_RELEASE_TABLET | Freq: Two times a day (BID) | ORAL | 1 refills | Status: AC

## 2022-01-27 MED ORDER — SODIUM CHLORIDE 0.9 % IV SOLN: 8.0000 mg/kg | Freq: Every day | INTRAVENOUS | Status: AC

## 2022-01-27 MED ORDER — BUSPIRONE HCL 5 MG PO TABS: 5.0000 mg | ORAL_TABLET | Freq: Two times a day (BID) | ORAL | Status: AC

## 2022-01-27 MED ORDER — ALPRAZOLAM 0.5 MG PO TABS: 0.5000 mg | ORAL_TABLET | Freq: Two times a day (BID) | ORAL | 0 refills | Status: AC

## 2022-01-27 MED ORDER — DONEPEZIL HCL 10 MG PO TABS: 10.0000 mg | ORAL_TABLET | Freq: Every day | ORAL | 0 refills | Status: AC

## 2022-01-27 MED ORDER — HEPARIN SOD (PORK) LOCK FLUSH 100 UNIT/ML IV SOLN
250.0000 [IU] | INTRAVENOUS | Status: AC | PRN
  Administered 2022-01-27: 250 [IU]
  Filled 2022-01-27: qty 2.5

## 2022-01-27 MED ORDER — ADULT MULTIVITAMIN W/MINERALS CH: 1.0000 | ORAL_TABLET | Freq: Every day | ORAL | Status: AC

## 2022-01-27 MED ORDER — METOPROLOL SUCCINATE ER 25 MG PO TB24: 25.0000 mg | ORAL_TABLET | Freq: Every day | ORAL | 0 refills | Status: AC

## 2022-01-27 MED ORDER — GERHARDT'S BUTT CREAM
1.0000 | TOPICAL_CREAM | Freq: Two times a day (BID) | CUTANEOUS | 0 refills | Status: AC
Start: 2022-01-27 — End: ?

## 2022-01-27 MED ORDER — LOSARTAN POTASSIUM 25 MG PO TABS: 25.0000 mg | ORAL_TABLET | Freq: Every day | ORAL | 0 refills | Status: DC

## 2022-01-27 MED ORDER — ACETAMINOPHEN 325 MG PO TABS: 650.0000 mg | ORAL_TABLET | Freq: Three times a day (TID) | ORAL | Status: AC

## 2022-01-27 MED ORDER — METHOCARBAMOL 500 MG PO TABS: 500.0000 mg | ORAL_TABLET | Freq: Four times a day (QID) | ORAL | 1 refills | Status: DC | PRN

## 2022-01-27 MED ORDER — OXYCODONE HCL 5 MG PO TABS
5.0000 mg | ORAL_TABLET | ORAL | 0 refills | Status: AC | PRN
Start: 2022-01-27 — End: ?

## 2022-01-27 MED ORDER — LEVOTHYROXINE SODIUM 137 MCG PO TABS: 137.0000 ug | ORAL_TABLET | Freq: Every day | ORAL | 0 refills | Status: AC

## 2022-01-27 NOTE — Progress Notes (Signed)
Nutrition Education Note ? ?RD consulted for diet education. RD provide handout "Colostomy Nutrition Therapy" from the Academy of Nutrition and Dietetics Manual. List of foods recommended and not recommended were discussed, while following a low residue diet for ~6 weeks post colostomy. Pt reports understanding of information discussed. Additionally discussed adequate caloric and protein needs to aid in healing. Pt currently has Boost Breeze ordered and has been consuming them. Pt plans on continuation of Boost at home. Pt reports having a good appetite and eating well. Plans for discharge home today.  ? ?Corrin Parker, MS, RD, LDN ?RD pager number/after hours weekend pager number on Amion. ? ?

## 2022-01-27 NOTE — Progress Notes (Addendum)
?                                                       PROGRESS NOTE ? ? ?Subjective/Complaints: ? ?Still has some tenderness around upper abd, no blood noted in colostomy bag, output still black as expected.  Pt has several dietary questions about low residue diet  ?Also spoke to daughter who cannot come in until 130p ? ?ROS: ? ?Pt denies SOB, CP, N/V/C/D, and vision changes, no abd pain  ? ?Objective: ?  ?No results found. ?Recent Labs  ?  01/26/22 ?0422 01/27/22 ?0406  ?WBC 7.9 10.2  ?HGB 8.1* 8.1*  ?HCT 27.4* 27.5*  ?PLT 141* 176  ? ? ?No results for input(s): NA, K, CL, CO2, GLUCOSE, BUN, CREATININE, CALCIUM in the last 72 hours. ? ? ? ?Intake/Output Summary (Last 24 hours) at 01/27/2022 0811 ?Last data filed at 01/27/2022 0300 ?Gross per 24 hour  ?Intake 840 ml  ?Output 750 ml  ?Net 90 ml  ? ?  ? ?Pressure Injury 01/06/22 Sacrum Unstageable - Full thickness tissue loss in which the base of the injury is covered by slough (yellow, tan, gray, green or brown) and/or eschar (tan, brown or black) in the wound bed. yellow slough covering wound base (Active)  ?01/06/22 1603  ?Location: Sacrum  ?Location Orientation:   ?Staging: Unstageable - Full thickness tissue loss in which the base of the injury is covered by slough (yellow, tan, gray, green or brown) and/or eschar (tan, brown or black) in the wound bed.  ?Wound Description (Comments): yellow slough covering wound base  ?Present on Admission: Yes  ? ? ?Physical Exam: ?Vital Signs ?Blood pressure (!) 94/52, pulse (!) 59, temperature 98.4 ?F (36.9 ?C), temperature source Oral, resp. rate 18, height 5' (1.524 m), weight 63.5 kg, SpO2 100 %. ? ? ?General: No acute distress ?Mood and affect are appropriate ?Heart: Regular rate and rhythm no rubs murmurs or extra sounds ?Lungs: Clear to auscultation, breathing unlabored, no rales or wheezes ?Abdomen: Positive bowel sounds, soft nontender to palpation, nondistended, colostomy output black firm ?Extremities: No clubbing,  cyanosis, or edema ?Skin: No evidence of breakdown, no evidence of rash ? ? ?  General: Skin is warm and dry.  ?   Comments: Midline incision aCDI, no leakage around colostomy bag,  ?Neurological:  ?   Mental Status: She is alert and oriented to person, place, and time.  ?   Motor: Weakness present.  ?   Comments: Weak dorsiflexion of right foot  ? ?Assessment/Plan: ?1. Functional deficits due to debility ?Stable for D/C today ?F/u PCP in 3-4 weeks ?F/u Gen Surg 2 weeks ?F/u GI 4-6wks ?See D/C summary ?See D/C instructions ?  ?Care Tool: ? ?Bathing ?   ?Body parts bathed by patient: Right arm, Left arm, Chest, Abdomen, Front perineal area, Buttocks, Face, Right upper leg, Left upper leg, Left lower leg, Right lower leg  ? Body parts bathed by helper: Right lower leg, Left lower leg ?  ?  ?Bathing assist Assist Level: Supervision/Verbal cueing ?  ?  ?Upper Body Dressing/Undressing ?Upper body dressing   ?What is the patient wearing?: Pull over shirt ?   ?Upper body assist Assist Level: Supervision/Verbal cueing ?   ?Lower Body Dressing/Undressing ?Lower body dressing ? ? ?   ?What is the  patient wearing?: Pants ? ?  ? ?Lower body assist Assist for lower body dressing: Contact Guard/Touching assist ?   ? ?Toileting ?Toileting    ?Toileting assist Assist for toileting: Independent with assistive device ?  ?  ?Transfers ?Chair/bed transfer ? ?Transfers assist ?   ? ?Chair/bed transfer assist level: Supervision/Verbal cueing ?  ?  ?Locomotion ?Ambulation ? ? ?Ambulation assist ? ? Ambulation activity did not occur: Safety/medical concerns ? ?  ?  ?   ? ?Walk 10 feet activity ? ? ?Assist ? Walk 10 feet activity did not occur: Safety/medical concerns ? ?  ?   ? ?Walk 50 feet activity ? ? ?Assist Walk 50 feet with 2 turns activity did not occur: Safety/medical concerns ? ?  ?   ? ? ?Walk 150 feet activity ? ? ?Assist Walk 150 feet activity did not occur: Safety/medical concerns ? ?  ?  ?  ? ?Walk 10 feet on uneven surface   ?activity ? ? ?Assist Walk 10 feet on uneven surfaces activity did not occur: Safety/medical concerns ? ? ?  ?   ? ?Wheelchair ? ? ? ? ?Assist Is the patient using a wheelchair?: Yes ?Type of Wheelchair: Manual ?  ? ?Wheelchair assist level: Independent ?Max wheelchair distance: 12'  ? ? ?Wheelchair 50 feet with 2 turns activity ? ? ? ?Assist ? ?  ?  ? ? ?Assist Level: Independent  ? ?Wheelchair 150 feet activity  ? ? ? ?Assist ?   ? ? ?Assist Level: Moderate Assistance - Patient 50 - 74%  ? ?Blood pressure (!) 94/52, pulse (!) 59, temperature 98.4 ?F (36.9 ?C), temperature source Oral, resp. rate 18, height 5' (1.524 m), weight 63.5 kg, SpO2 100 %. ? ?Medical Problem List and Plan: ?1. Functional deficits secondary to perforated diverticulum, exploratory laparotomy with colostomy, recent tibial plateau fracture repair. ?      D/C home today 4/13 ?  ?            ?2.  Antithrombotics: ?-DVT/anticoagulation:  Pharmaceutical: Other (comment)Eliquis for 3 months for right axillary vein DVT. Eliquis held 4/8 , per GI would not resume sooner than 4/17             -antiplatelet therapy: none ?3. Pain: continue Tylenol, oxycodone PRN ?            --continue Robaxin 500 mg q 6 hours ?4. Mood: LCSW to evaluate and provide emotional support ?            -continue Wellbutrin XL 300 mg q AM ?            -continue Buspar 5 mg BID ?            --continue Xanax 0.5 mg prn anxiety ?            -antipsychotic agents: n/a ?5. Neuropsych: This patient is capable of making decisions on her own behalf. ?6. Skin/Wound Care: Routine skin care checks ?            -- Monitor midline incision, LLE incisions ?            -- Routine stoma care ?            --Monitor RUE bullous lesions ?  ? 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 ?10: COPD: continue chronic  prednisone  20 mg daily ?            -continue Breo Ellipta + Incruse Ellipta ?            --albuterol nebs prn ? 3/30- on O2 3L -home O2- con't ?11: OSA: continue CPAP nightly; ? O2 bleed in ?12: Recent repair of left tibial plateau fracture with bone biopsies ?            --s/p ORIF by Dr Doreatha Martin 12/13/2021 ?            --weight bearing with transfers only; unrestricted ROM ?            --bone culture +MRSA ?            --continue daptomycin through 4/26 ?            --follow-up with ID ? ?13: Hypertension: continue Toprol XL 50 mg daily ?Vitals:  ? 01/26/22 1314 01/26/22 2004  ?BP: 106/62 (!) 94/52  ?Pulse: 64 (!) 59  ?Resp: 17 18  ?Temp: 98 ?F (36.7 ?C) 98.4 ?F (36.9 ?C)  ?SpO2: 100% 100%  ? Mild brady, sys BP a little low , reduce toprol to 56m on 4/13 ?14: CKD stage IIIb: normal serum creatinine at 0.66 ? ?  Latest Ref Rng & Units 01/24/2022  ?  4:30 AM 01/19/2022  ?  5:28 AM 01/17/2022  ?  5:14 AM  ?BMP  ?Glucose 70 - 99 mg/dL 86   92   97    ?BUN 8 - 23 mg/dL _0 ?Creatinine 0.44 - 1.00 mg/dL 0.63   0.68   0.65    ?Sodium 135 - 145 mmol/L 140   141   137    ?Potassium 3.5 - 5.1 mmol/L 3.9   3.5   4.0    ?Chloride 98 - 111 mmol/L 102   98   100    ?CO2 22 - 32 mmol/L 35   39   30    ?Calcium 8.9 - 10.3 mg/dL 9.7   9.6   9.0    ? ?Renal fx stable  ?15: Hypothyroidism: continue Synthroid ?16: ? Cognitive decline: on Aricept ?17: Hyperlipidemia: continue Lipitor ?18: Insomnia/poor sleep hygiene: continue Remeron 30 mg q HS ? 3/31- sleeping stable- con't regimen ?19: Right foot weakness: PRAFO boot ?20: Glossitis, possible thrush: she is requesting Nystatin s/s ?21. Dysuria/Yeast UTI- and urgency possible UTI ? 3/30- will check U/A and Cx- checked 3/27- but could be positive now. Will allow Purewick at night for now.  ? 3/31- is yeast UTI- will give Diflucan 200 mg x1 and 100 mg daily x 6 days- hopefully will help dysuria ? ?22. Leukocytosis ? 4/3- is a little elevated- afebrile- will recheck Thursday to see  what's going ON, see if resolves.  ?ESR of 4! ? 4/4- labs tomorrow ? 4/5- WBC down to 7.9k-  ?23. Colostomy ? Continue colostomy training  ?24. Osteomyelitis ? Continue Daptomycin- tunnel catheter  IV ABX

## 2022-01-27 NOTE — Progress Notes (Signed)
Pt discharged home with family via wheelchair. Discharge instructions reviewed with pt and daughter by Risa Grill, PA, verbalizes understanding. Ostomy supplies given to patient.  ?

## 2022-01-27 NOTE — Consult Note (Signed)
[facility] Nurse wound follow up ?Patient receiving care in [place] ?Wound type: Unstageable coccyx pressure injury ?Measurement: 2.5 cm x 2 cm x 0.1 cm ?Wound bed: 100% yellow ?Drainage (amount, consistency, odor) Sanguinous ?Periwound: erythema ?Dressing procedure/placement/frequency: ?Continue MediHoney daily. Cleanse the wound with NS. Pat dry then apply a nickel thick layer of MediHoney directly to the wound or onto a dressing. Make certain that the dressing covers the entire wound base, but not in contact with the peri-wound skin, then cover with gauze and secure with foam dressing. Change dressing daily.  ? ?[facility] Nurse ostomy follow up ?Stoma type/location: LUQ colostomy ?Stomal assessment/size: 1 1/2" round, budded ?Peristomal assessment: intact ?Treatment options for stomal/peristomal skin: barrier ring ?Output: solid dark brown ?Ostomy pouching: 2pc. 2 3/4" pouch Kellie Simmering # [phone]) Skin barrier Kellie Simmering # 2) Barrier ring Kellie Simmering # [phone]) ?Education provided: Patient states her daughter will change her pouch most of the time and she is comfortable with emptying.  ?Enrolled patient in [facility] Start Discharge program: Yes ? ?Encourage patient to eat the following foods to soften her stool: ?BRAN PRODUCTS ?FRUIT JUICES  ?FRUIT (FRESH/RAW OR  ?COOKED) ?OATMEAL ?PRUNES  ?RAISINS ?VEGETABLES (FRESH/RAW OR  ?COOKED)  ?WATER (STAY HYDRATED) ?WARM BEVERAGES ?WARM SOUPS ?WHOLE GRAINS ? ?Please order 10 of each ostomy products: Kellie Simmering # [id number] Ashley Royalty # [phone] to take home with her. ? ?I recommend/discussed the [facility] outpatient ostomy clinic as an outpatient resource.  IF MD agrees this would be beneficial, please fax referral, or enter electronically in Epic the referral. (Fax- [phone])  ? ?[facility] team will sign off at this time. Patient is scheduled to be discharged today.  ? ?Cathlean Marseilles. Tamala Julian, MSN, RN, CMSRN, AGCNS, WTA ?Wound Treatment Associate ?Pager [phone]  ? ? ? ?  ?

## 2022-01-27 NOTE — Progress Notes (Signed)
Patient ID: Holly [name], female   DOB: January 06, 1946, 76 y.o.   MRN: [id number]  According to MD pt is medically stable to discharge home today. Have informed [facility] for home and home health agency ?

## 2022-01-27 NOTE — Progress Notes (Signed)
Patient ID: Holly [name], female   DOB: 12-Jul-1946, 76 y.o.   MRN: [id number] ?Ostomy supplies and wound care supplies ordered and delivered to room for discharge.  ?Dorien Chihuahua B ? ?

## 2022-01-28 DIAGNOSIS — C3412 Malignant neoplasm of upper lobe, left bronchus or lung: Secondary | ICD-10-CM | POA: Diagnosis not present

## 2022-01-28 DIAGNOSIS — B9562 Methicillin resistant Staphylococcus aureus infection as the cause of diseases classified elsewhere: Secondary | ICD-10-CM | POA: Diagnosis not present

## 2022-01-28 DIAGNOSIS — M84462D Pathological fracture, left tibia, subsequent encounter for fracture with routine healing: Secondary | ICD-10-CM | POA: Diagnosis not present

## 2022-01-28 DIAGNOSIS — C342 Malignant neoplasm of middle lobe, bronchus or lung: Secondary | ICD-10-CM | POA: Diagnosis not present

## 2022-01-28 DIAGNOSIS — M86362 Chronic multifocal osteomyelitis, left tibia and fibula: Secondary | ICD-10-CM | POA: Diagnosis not present

## 2022-01-28 DIAGNOSIS — T84623A Infection and inflammatory reaction due to internal fixation device of left tibia, initial encounter: Secondary | ICD-10-CM | POA: Diagnosis not present

## 2022-01-28 NOTE — Progress Notes (Signed)
Inpatient Rehabilitation Discharge Medication Review by a Pharmacist ? ?A complete drug regimen review was completed for this patient to identify any potential clinically significant medication issues. ? ?High Risk Drug Classes Is patient taking? Indication by Medication  ?Antipsychotic No   ?Anticoagulant Yes Apixaban- RT. Axillary vein DVT (3 months of treatment)  ?Antibiotic Yes Daptomycin- osteomyelitis  ?Opioid Yes OxyIR- acute pain  ?Antiplatelet No   ?Hypoglycemics/insulin No   ?Vasoactive Medication Yes Losartan, Toprol- hypertension  ?Chemotherapy No   ?Other Yes Lipitor- HLD ?Wellbutrin- MDD ?Buspar- anxiety ?Aricept- Alzheimer's ?Synthroid- hypothyroidism ?Remeron- MDD ?Protonix- GERD  ? ? ? ?Type of Medication Issue Identified Description of Issue Recommendation(s)  ?Drug Interaction(s) (clinically significant) ?    ?Duplicate Therapy ?    ?Allergy ?    ?No Medication Administration End Date ?    ?Incorrect Dose ?    ?Additional Drug Therapy Needed ?    ?Significant med changes from prior encounter (inform family/care partners about these prior to discharge).    ?Other ?    ? ? ?Clinically significant medication issues were identified that warrant physician communication and completion of prescribed/recommended actions by midnight of the next day:  No ? ?Time spent performing this drug regimen review (minutes):  30 ? ? ?Rai Severns BS, PharmD, BCPS ?Clinical Pharmacist ?01/28/2022 1:00 PM ? ?Contact: [phone] after 3 PM ? ?"Be curious, not judgmental..." -Jamal Maes ?

## 2022-01-28 NOTE — Progress Notes (Signed)
Inpatient Rehabilitation Care Coordinator ?Discharge Note  ? ?Patient Details  ?Name: Kimberly Nieland Balistreri ?MRN: [id number] ?Date of Birth: [birth year] ? ? ?Discharge location: HOME WITH SON WHO IS STAYING WITH HER-DAUGHTER IS MAIN CAREGIVER BUT DOES NOT LIVE WITH HER ? ?Length of Stay: 12 DAYS ? ?Discharge activity level: SUPERVISION-CGA LEVEL-WHEELCHAIR LEVEL ? ?Home/community participation: ACTIVE ? ?Patient response YK:ZLDJTT Literacy - How often do you need to have someone help you when you read instructions, pamphlets, or other written material from your doctor or pharmacy?: Never ? ?Patient response SV:XBLTJQ Isolation - How often do you feel lonely or isolated from those around you?: Never ? ?Services provided included: MD, RD, PT, OT, RN, CM, Pharmacy, SW ? ?Financial Services:  ?Charity fundraiser Utilized: Medicare ?  ? ?Choices offered to/list presented to: PT AND DAUGHTER ? ?Follow-up services arranged:  ?Home Health, DME, Patient/Family request agency HH/DME ?Home Health Agency: [facility]-PT, OT, RN, AIDE  ?  ?DME : [facility] ?HH/DME Requested Agency: [facility] ? ?Patient response to transportation need: ?Is the patient able to respond to transportation needs?: Yes ?In the past 12 months, has lack of transportation kept you from medical appointments or from getting medications?: No ?In the past 12 months, has lack of transportation kept you from meetings, work, or from getting things needed for daily living?: No ? ? ? ?Comments (or additional information):DAUGHTER WAS IN FOR EDUCATION BOTH COMFORTABLE WITH HER CARE NEEDS. ? ?Patient/Family verbalized understanding of follow-up arrangements:  Yes ? ?Individual responsible for coordination of the follow-up plan: [name]-DAUGHTER [phone] ? ?Confirmed correct DME delivered: [name] 01/28/2022   ? ?[name] ?

## 2022-01-29 DIAGNOSIS — D630 Anemia in neoplastic disease: Secondary | ICD-10-CM | POA: Diagnosis not present

## 2022-01-29 DIAGNOSIS — K5720 Diverticulitis of large intestine with perforation and abscess without bleeding: Secondary | ICD-10-CM | POA: Diagnosis not present

## 2022-01-29 DIAGNOSIS — L89153 Pressure ulcer of sacral region, stage 3: Secondary | ICD-10-CM | POA: Diagnosis not present

## 2022-01-29 DIAGNOSIS — C3412 Malignant neoplasm of upper lobe, left bronchus or lung: Secondary | ICD-10-CM | POA: Diagnosis not present

## 2022-01-29 DIAGNOSIS — E782 Mixed hyperlipidemia: Secondary | ICD-10-CM | POA: Diagnosis not present

## 2022-01-29 DIAGNOSIS — J449 Chronic obstructive pulmonary disease, unspecified: Secondary | ICD-10-CM | POA: Diagnosis not present

## 2022-01-29 DIAGNOSIS — M86362 Chronic multifocal osteomyelitis, left tibia and fibula: Secondary | ICD-10-CM | POA: Diagnosis not present

## 2022-01-29 DIAGNOSIS — I129 Hypertensive chronic kidney disease with stage 1 through stage 4 chronic kidney disease, or unspecified chronic kidney disease: Secondary | ICD-10-CM | POA: Diagnosis not present

## 2022-01-29 DIAGNOSIS — M84462A Pathological fracture, left tibia, initial encounter for fracture: Secondary | ICD-10-CM | POA: Diagnosis not present

## 2022-01-29 DIAGNOSIS — T84623A Infection and inflammatory reaction due to internal fixation device of left tibia, initial encounter: Secondary | ICD-10-CM | POA: Diagnosis not present

## 2022-01-29 DIAGNOSIS — J9611 Chronic respiratory failure with hypoxia: Secondary | ICD-10-CM | POA: Diagnosis not present

## 2022-01-29 DIAGNOSIS — B9562 Methicillin resistant Staphylococcus aureus infection as the cause of diseases classified elsewhere: Secondary | ICD-10-CM | POA: Diagnosis not present

## 2022-01-29 DIAGNOSIS — N1832 Chronic kidney disease, stage 3b: Secondary | ICD-10-CM | POA: Diagnosis not present

## 2022-01-29 DIAGNOSIS — E039 Hypothyroidism, unspecified: Secondary | ICD-10-CM | POA: Diagnosis not present

## 2022-01-29 DIAGNOSIS — H269 Unspecified cataract: Secondary | ICD-10-CM | POA: Diagnosis not present

## 2022-01-29 DIAGNOSIS — I82A12 Acute embolism and thrombosis of left axillary vein: Secondary | ICD-10-CM | POA: Diagnosis not present

## 2022-01-29 DIAGNOSIS — G4730 Sleep apnea, unspecified: Secondary | ICD-10-CM | POA: Diagnosis not present

## 2022-01-29 DIAGNOSIS — M542 Cervicalgia: Secondary | ICD-10-CM | POA: Diagnosis not present

## 2022-01-29 DIAGNOSIS — Z452 Encounter for adjustment and management of vascular access device: Secondary | ICD-10-CM | POA: Diagnosis not present

## 2022-01-29 DIAGNOSIS — F419 Anxiety disorder, unspecified: Secondary | ICD-10-CM | POA: Diagnosis not present

## 2022-01-29 DIAGNOSIS — C342 Malignant neoplasm of middle lobe, bronchus or lung: Secondary | ICD-10-CM | POA: Diagnosis not present

## 2022-01-29 DIAGNOSIS — Z5181 Encounter for therapeutic drug level monitoring: Secondary | ICD-10-CM | POA: Diagnosis not present

## 2022-01-29 DIAGNOSIS — F32A Depression, unspecified: Secondary | ICD-10-CM | POA: Diagnosis not present

## 2022-01-29 DIAGNOSIS — I739 Peripheral vascular disease, unspecified: Secondary | ICD-10-CM | POA: Diagnosis not present

## 2022-01-29 DIAGNOSIS — R350 Frequency of micturition: Secondary | ICD-10-CM | POA: Diagnosis not present

## 2022-01-30 DIAGNOSIS — M84462A Pathological fracture, left tibia, initial encounter for fracture: Secondary | ICD-10-CM | POA: Diagnosis not present

## 2022-01-30 DIAGNOSIS — T84623A Infection and inflammatory reaction due to internal fixation device of left tibia, initial encounter: Secondary | ICD-10-CM | POA: Diagnosis not present

## 2022-01-30 DIAGNOSIS — B9562 Methicillin resistant Staphylococcus aureus infection as the cause of diseases classified elsewhere: Secondary | ICD-10-CM | POA: Diagnosis not present

## 2022-01-30 DIAGNOSIS — M86362 Chronic multifocal osteomyelitis, left tibia and fibula: Secondary | ICD-10-CM | POA: Diagnosis not present

## 2022-01-30 DIAGNOSIS — C342 Malignant neoplasm of middle lobe, bronchus or lung: Secondary | ICD-10-CM | POA: Diagnosis not present

## 2022-01-30 DIAGNOSIS — K5720 Diverticulitis of large intestine with perforation and abscess without bleeding: Secondary | ICD-10-CM | POA: Diagnosis not present

## 2022-01-31 DIAGNOSIS — K5720 Diverticulitis of large intestine with perforation and abscess without bleeding: Secondary | ICD-10-CM | POA: Diagnosis not present

## 2022-01-31 DIAGNOSIS — B9562 Methicillin resistant Staphylococcus aureus infection as the cause of diseases classified elsewhere: Secondary | ICD-10-CM | POA: Diagnosis not present

## 2022-01-31 DIAGNOSIS — T84623A Infection and inflammatory reaction due to internal fixation device of left tibia, initial encounter: Secondary | ICD-10-CM | POA: Diagnosis not present

## 2022-01-31 DIAGNOSIS — M84462A Pathological fracture, left tibia, initial encounter for fracture: Secondary | ICD-10-CM | POA: Diagnosis not present

## 2022-01-31 DIAGNOSIS — C342 Malignant neoplasm of middle lobe, bronchus or lung: Secondary | ICD-10-CM | POA: Diagnosis not present

## 2022-01-31 DIAGNOSIS — M86362 Chronic multifocal osteomyelitis, left tibia and fibula: Secondary | ICD-10-CM | POA: Diagnosis not present

## 2022-02-01 DIAGNOSIS — N183 Chronic kidney disease, stage 3 unspecified: Secondary | ICD-10-CM | POA: Diagnosis not present

## 2022-02-01 DIAGNOSIS — J9611 Chronic respiratory failure with hypoxia: Secondary | ICD-10-CM | POA: Diagnosis not present

## 2022-02-01 DIAGNOSIS — M869 Osteomyelitis, unspecified: Secondary | ICD-10-CM | POA: Diagnosis not present

## 2022-02-01 DIAGNOSIS — E785 Hyperlipidemia, unspecified: Secondary | ICD-10-CM | POA: Diagnosis not present

## 2022-02-01 DIAGNOSIS — Z933 Colostomy status: Secondary | ICD-10-CM | POA: Diagnosis not present

## 2022-02-01 DIAGNOSIS — L89150 Pressure ulcer of sacral region, unstageable: Secondary | ICD-10-CM | POA: Diagnosis not present

## 2022-02-01 DIAGNOSIS — I82A11 Acute embolism and thrombosis of right axillary vein: Secondary | ICD-10-CM | POA: Diagnosis not present

## 2022-02-01 DIAGNOSIS — F339 Major depressive disorder, recurrent, unspecified: Secondary | ICD-10-CM | POA: Diagnosis not present

## 2022-02-01 DIAGNOSIS — I129 Hypertensive chronic kidney disease with stage 1 through stage 4 chronic kidney disease, or unspecified chronic kidney disease: Secondary | ICD-10-CM | POA: Diagnosis not present

## 2022-02-02 DIAGNOSIS — M84462A Pathological fracture, left tibia, initial encounter for fracture: Secondary | ICD-10-CM | POA: Diagnosis not present

## 2022-02-02 DIAGNOSIS — B9562 Methicillin resistant Staphylococcus aureus infection as the cause of diseases classified elsewhere: Secondary | ICD-10-CM | POA: Diagnosis not present

## 2022-02-02 DIAGNOSIS — M86362 Chronic multifocal osteomyelitis, left tibia and fibula: Secondary | ICD-10-CM | POA: Diagnosis not present

## 2022-02-02 DIAGNOSIS — K5720 Diverticulitis of large intestine with perforation and abscess without bleeding: Secondary | ICD-10-CM | POA: Diagnosis not present

## 2022-02-02 DIAGNOSIS — T84623A Infection and inflammatory reaction due to internal fixation device of left tibia, initial encounter: Secondary | ICD-10-CM | POA: Diagnosis not present

## 2022-02-02 DIAGNOSIS — C342 Malignant neoplasm of middle lobe, bronchus or lung: Secondary | ICD-10-CM | POA: Diagnosis not present

## 2022-02-04 DIAGNOSIS — K5720 Diverticulitis of large intestine with perforation and abscess without bleeding: Secondary | ICD-10-CM | POA: Diagnosis not present

## 2022-02-04 DIAGNOSIS — M86362 Chronic multifocal osteomyelitis, left tibia and fibula: Secondary | ICD-10-CM | POA: Diagnosis not present

## 2022-02-04 DIAGNOSIS — C342 Malignant neoplasm of middle lobe, bronchus or lung: Secondary | ICD-10-CM | POA: Diagnosis not present

## 2022-02-04 DIAGNOSIS — M84462A Pathological fracture, left tibia, initial encounter for fracture: Secondary | ICD-10-CM | POA: Diagnosis not present

## 2022-02-04 DIAGNOSIS — B9562 Methicillin resistant Staphylococcus aureus infection as the cause of diseases classified elsewhere: Secondary | ICD-10-CM | POA: Diagnosis not present

## 2022-02-04 DIAGNOSIS — T84623A Infection and inflammatory reaction due to internal fixation device of left tibia, initial encounter: Secondary | ICD-10-CM | POA: Diagnosis not present

## 2022-02-07 DIAGNOSIS — K5720 Diverticulitis of large intestine with perforation and abscess without bleeding: Secondary | ICD-10-CM | POA: Diagnosis not present

## 2022-02-07 DIAGNOSIS — C342 Malignant neoplasm of middle lobe, bronchus or lung: Secondary | ICD-10-CM | POA: Diagnosis not present

## 2022-02-07 DIAGNOSIS — M86362 Chronic multifocal osteomyelitis, left tibia and fibula: Secondary | ICD-10-CM | POA: Diagnosis not present

## 2022-02-07 DIAGNOSIS — K5792 Diverticulitis of intestine, part unspecified, without perforation or abscess without bleeding: Secondary | ICD-10-CM | POA: Diagnosis not present

## 2022-02-07 DIAGNOSIS — B9562 Methicillin resistant Staphylococcus aureus infection as the cause of diseases classified elsewhere: Secondary | ICD-10-CM | POA: Diagnosis not present

## 2022-02-07 DIAGNOSIS — M84462A Pathological fracture, left tibia, initial encounter for fracture: Secondary | ICD-10-CM | POA: Diagnosis not present

## 2022-02-07 DIAGNOSIS — T84623A Infection and inflammatory reaction due to internal fixation device of left tibia, initial encounter: Secondary | ICD-10-CM | POA: Diagnosis not present

## 2022-02-08 ENCOUNTER — Ambulatory Visit (INDEPENDENT_AMBULATORY_CARE_PROVIDER_SITE_OTHER): Payer: Medicare Other | Admitting: Infectious Disease

## 2022-02-08 ENCOUNTER — Other Ambulatory Visit: Payer: Self-pay

## 2022-02-08 ENCOUNTER — Encounter: Payer: Self-pay | Admitting: Infectious Disease

## 2022-02-08 VITALS — BP 153/83 | HR 101 | Temp 97.5°F

## 2022-02-08 DIAGNOSIS — K5720 Diverticulitis of large intestine with perforation and abscess without bleeding: Secondary | ICD-10-CM

## 2022-02-08 DIAGNOSIS — A4902 Methicillin resistant Staphylococcus aureus infection, unspecified site: Secondary | ICD-10-CM

## 2022-02-08 DIAGNOSIS — C342 Malignant neoplasm of middle lobe, bronchus or lung: Secondary | ICD-10-CM

## 2022-02-08 DIAGNOSIS — Z7185 Encounter for immunization safety counseling: Secondary | ICD-10-CM | POA: Diagnosis not present

## 2022-02-08 DIAGNOSIS — M86252 Subacute osteomyelitis, left femur: Secondary | ICD-10-CM | POA: Diagnosis not present

## 2022-02-08 DIAGNOSIS — J449 Chronic obstructive pulmonary disease, unspecified: Secondary | ICD-10-CM | POA: Diagnosis not present

## 2022-02-08 DIAGNOSIS — I5032 Chronic diastolic (congestive) heart failure: Secondary | ICD-10-CM | POA: Diagnosis not present

## 2022-02-08 DIAGNOSIS — E785 Hyperlipidemia, unspecified: Secondary | ICD-10-CM | POA: Diagnosis not present

## 2022-02-08 DIAGNOSIS — S82142D Displaced bicondylar fracture of left tibia, subsequent encounter for closed fracture with routine healing: Secondary | ICD-10-CM | POA: Diagnosis not present

## 2022-02-08 DIAGNOSIS — J9611 Chronic respiratory failure with hypoxia: Secondary | ICD-10-CM | POA: Diagnosis not present

## 2022-02-08 DIAGNOSIS — T847XXD Infection and inflammatory reaction due to other internal orthopedic prosthetic devices, implants and grafts, subsequent encounter: Secondary | ICD-10-CM | POA: Diagnosis not present

## 2022-02-08 DIAGNOSIS — E890 Postprocedural hypothyroidism: Secondary | ICD-10-CM | POA: Diagnosis not present

## 2022-02-08 HISTORY — DX: Encounter for immunization safety counseling: Z71.85

## 2022-02-08 MED ORDER — DOXYCYCLINE HYCLATE 100 MG PO TABS: 100.0000 mg | ORAL_TABLET | Freq: Two times a day (BID) | ORAL | 11 refills | Status: AC

## 2022-02-08 NOTE — Progress Notes (Signed)
? ?Subjective:  ?Follow-up for hardware associated osteomyelitis and recent diverticular perforation. ? ? Patient ID: Holly [name], female    DOB: Feb 22, 1946, 76 y.o.   MRN: [id number] ? 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 ? ?She was brought to the hospital by Dr. Doreatha Martin who performed surgery and performed open reduction internal fixation of the left tibial plateau fracture and a biopsy of the left proximal tibia.  Biopsy came back negative for malignancy but multiple cultures that were also taken of come back positive for methicillin resistant Staphylococcus aureus. ? ?She has been placed on doxycycline 100 mg twice daily by Dr. Doreatha Martin and referred to [place] for evaluation of her osteomyelitis complicated by hardware. ? 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 Doreatha Martin and further additional surgery in March 2022.  ? ?We ended up placing her on IV daptomycin. ? ?Unfortunately not long after having been seen by me in clinic she was admitted to the hospital with a perforated diverticula with an abscess. ? ?She was initially managed with antibiotics alone but ultimately taken to the operating room where she underwent exploratory laparotomy with debridement of abscess diverting colostomy with Hartmann procedure. ? ?She was continued on antibiotics initially ceftriaxone Flagyl then ertapenem and Zosyn for the abdominal infection.  She continued on daptomycin for her osteomyelitis. ? ?She has been safely discharged in the hospital is been at home.  She did  develop a decubitus ulcer in her sacral area but this is apparently not that deep yet and they are managing it by offloading the area. ? ?She has follow-up with Dr. Doreatha Martin.  She is not bearing weight right now and does not have much pain unless she bends her knee. ? ? ? ?Past Medical History:  ?Diagnosis Date  ? Anemia   ? blood transfusion November 2022  ? Anxiety   ? Asthma   ? Basal cell carcinoma   ? lung cancer.  Skin cancer- basal - nose removed  ? Bruises easily   ? Cataract   ? Cervicalgia   ? COPD (chronic obstructive pulmonary disease) ([place])   ? emphysema and COPD  ? Depression   ? Dyspnea   ? uses O2 only when needed. Uses 2 L   ? Foot swelling   ? left- has shown to Drs.  ? Frequency of urination   ? Hardware complicating wound infection ([place]) 12/24/2021  ? Headache   ? History of skin cancer   ? 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 Hypercalcemia   ? Hypertension   ? Hypothyroidism   ? Mixed hyperlipidemia   ? MRSA infection 12/24/2021  ? Osteomyelitis of left tibia ([place]) 12/24/2021  ? Pneumonia   ? Pulmonary nodule   ? Sleep apnea   ? pt uses a cpap nightly  ? Thyroid neoplasm   ? Urinary incontinence   ? Vaccine counseling 02/08/2022  ? Wears dentures   ? Wears glasses   ? ? ?Past Surgical History:  ?Procedure Laterality Date  ? BIOPSY  02/17/2021  ?  Procedure: BIOPSY;  Surgeon: Otis Brace, MD;  Location: [facility];  Service: Gastroenterology;;  ? BONE BIOPSY Left 12/13/2021  ? Procedure: BONE BIOPSY;  Surgeon: Shona Needles, MD;  Location: [place];  Service: Orthopedics;  Laterality: Left;  ? COLONOSCOPY    ? COLOSTOMY N/A 01/04/2022  ? Procedure: COLOSTOMY;  Surgeon: Felicie Morn, MD;  Location: [place];  Service: General;  Laterality: N/A;  ? DENTAL SURGERY    ? ELBOW ARTHROPLASTY Right 2020  ? ESOPHAGOGASTRODUODENOSCOPY (EGD) WITH PROPOFOL N/A 02/17/2021  ? Procedure: ESOPHAGOGASTRODUODENOSCOPY (EGD) WITH PROPOFOL;  Surgeon: Otis Brace, MD;  Location: [facility];  Service: Gastroenterology;   Laterality: N/A;  ? ESOPHAGOGASTRODUODENOSCOPY (EGD) WITH PROPOFOL N/A 01/24/2022  ? Procedure: ESOPHAGOGASTRODUODENOSCOPY (EGD) WITH PROPOFOL;  Surgeon: Arta Silence, MD;  Location: [place];  Service: Gastroenterology;  Laterality: N/A;  ? EYE SURGERY Right   ? cataract surgery  ? FOOT SURGERY  30 YRS AGO  ? BILATERAL  ? HARDWARE REMOVAL Right 10/02/2020  ? Procedure: HARDWARE REMOVAL RIGHT ELBOW;  Surgeon: Shona Needles, MD;  Location: [place];  Service: Orthopedics;  Laterality: Right;  ? HEMOSTASIS CLIP PLACEMENT  01/24/2022  ? Procedure: HEMOSTASIS CLIP PLACEMENT;  Surgeon: Arta Silence, MD;  Location: [place];  Service: Gastroenterology;;  ? I & D EXTREMITY Right 01/13/2021  ? Procedure: IRRIGATION AND DEBRIDEMENT RIGHT ELBOW;  Surgeon: Shona Needles, MD;  Location: [place];  Service: Orthopedics;  Laterality: Right;  ? LAPAROTOMY N/A 01/04/2022  ? Procedure: EXPLORATORY LAPAROTOMY;  Surgeon: Felicie Morn, MD;  Location: [place];  Service: General;  Laterality: N/A;  ? ORIF ELBOW FRACTURE Right 12/06/2017  ? Procedure: OPEN REDUCTION INTERNAL FIXATION (ORIF) ELBOW/OLECRANON FRACTURE;  Surgeon: Shona Needles, MD;  Location: [place];  Service: Orthopedics;  Laterality: Right;  ? ORIF TIBIA PLATEAU Left 12/13/2021  ? Procedure: OPEN REDUCTION INTERNAL FIXATION (ORIF) TIBIAL PLATEAU;  Surgeon: Shona Needles, MD;  Location: [place];  Service: Orthopedics;  Laterality: Left;  ? PARTIAL COLECTOMY N/A 01/04/2022  ? Procedure: SIGMOID COLECTOMY;  Surgeon: Felicie Morn, MD;  Location: [place];  Service: General;  Laterality: N/A;  ? SKIN CANCER EXCISION    ? THYROIDECTOMY N/A 07/31/2014  ? Procedure: TOTAL THYROIDECTOMY;  Surgeon: Armandina Gemma, MD;  Location: [facility];  Service: General;  Laterality: N/A;  ? VAGINAL HYSTERECTOMY    ? VIDEO BRONCHOSCOPY WITH ENDOBRONCHIAL NAVIGATION Bilateral 06/25/2019  ? Procedure: VIDEO BRONCHOSCOPY WITH ENDOBRONCHIAL NAVIGATION;  Surgeon: Lajuana Matte, MD;   Location: [place];  Service: Thoracic;  Laterality: Bilateral;  ? ? ?Family History  ?Problem Relation Age of Onset  ? Coronary artery disease Father   ? Hypertension Father   ? Asthma Father   ? Allergies Father   ? Hypertension Mother   ? Lung cancer Mother   ? ? ?  ?Social History  ? ?Socioeconomic History  ? Marital status: Widowed  ?  Spouse name: Not on file  ? Number of children: 2  ? Years of education: Not on file  ? Highest education level: Not on file  ?Occupational History  ? Occupation: [employer]  ?Tobacco Use  ? Smoking status: Former  ?  Packs/day: 1.00  ?  Years: 30.00  ?  Pack years: 30.00  ?  Types: Cigarettes  ?  Quit date: 02/24/2015  ?  Years since quitting: 6.9  ? Smokeless tobacco: Never  ?Vaping Use  ? Vaping Use: Former  ?Substance and Sexual Activity  ?  Alcohol use: Yes  ?  Alcohol/week: 0.0 standard drinks  ?  Comment: very seldom  ? Drug use: Never  ? Sexual activity: Not Currently  ?  Comment: Hysterectomy  ?Other Topics Concern  ? Not on file  ?Social History Narrative  ? Not on file  ? ?Social Determinants of Health  ? ?Financial Resource Strain: Not on file  ?Food Insecurity: Not on file  ?Transportation Needs: Not on file  ?Physical Activity: Not on file  ?Stress: Not on file  ?Social Connections: Not on file  ? ? ?Allergies  ?Allergen Reactions  ? Amlodipine Besylate Swelling  ?  Edema  ? Codeine Nausea And Vomiting  ? Oxycodone-Acetaminophen Nausea And Vomiting  ? ? ? ?Current Outpatient Medications:  ?  acetaminophen (TYLENOL) 325 MG tablet, Take 2 tablets (650 mg total) by mouth 4 (four) times daily -  before meals and at bedtime., Disp: , Rfl:  ?  albuterol (PROVENTIL) (2.5 MG/3ML) 0.083% nebulizer solution, Take 2.5 mg by nebulization 3 (three) times daily as needed for wheezing or shortness of breath., Disp: , Rfl:  ?  ALPRAZolam (XANAX) 0.5 MG tablet, Take 1 tablet (0.5 mg total) by mouth 2 (two) times daily., Disp: 30 tablet, Rfl: 0 ?  apixaban (ELIQUIS) 5 MG TABS  tablet, Take 1 tablet (5 mg total) by mouth 2 (two) times daily., Disp: 60 tablet, Rfl: 2 ?  atorvastatin (LIPITOR) 20 MG tablet, Take 20 mg by mouth at bedtime. , Disp: , Rfl:  ?  buPROPion (WELLBUTRIN XL) 300 MG

## 2022-02-09 ENCOUNTER — Ambulatory Visit: Payer: Medicare Other | Admitting: Infectious Disease

## 2022-02-10 DIAGNOSIS — C342 Malignant neoplasm of middle lobe, bronchus or lung: Secondary | ICD-10-CM | POA: Diagnosis not present

## 2022-02-10 DIAGNOSIS — K5720 Diverticulitis of large intestine with perforation and abscess without bleeding: Secondary | ICD-10-CM | POA: Diagnosis not present

## 2022-02-10 DIAGNOSIS — T84623A Infection and inflammatory reaction due to internal fixation device of left tibia, initial encounter: Secondary | ICD-10-CM | POA: Diagnosis not present

## 2022-02-10 DIAGNOSIS — B9562 Methicillin resistant Staphylococcus aureus infection as the cause of diseases classified elsewhere: Secondary | ICD-10-CM | POA: Diagnosis not present

## 2022-02-10 DIAGNOSIS — M84462A Pathological fracture, left tibia, initial encounter for fracture: Secondary | ICD-10-CM | POA: Diagnosis not present

## 2022-02-10 DIAGNOSIS — M86362 Chronic multifocal osteomyelitis, left tibia and fibula: Secondary | ICD-10-CM | POA: Diagnosis not present

## 2022-02-15 ENCOUNTER — Telehealth: Payer: Self-pay

## 2022-02-15 DIAGNOSIS — T84623A Infection and inflammatory reaction due to internal fixation device of left tibia, initial encounter: Secondary | ICD-10-CM | POA: Diagnosis not present

## 2022-02-15 DIAGNOSIS — M84462A Pathological fracture, left tibia, initial encounter for fracture: Secondary | ICD-10-CM | POA: Diagnosis not present

## 2022-02-15 DIAGNOSIS — C342 Malignant neoplasm of middle lobe, bronchus or lung: Secondary | ICD-10-CM | POA: Diagnosis not present

## 2022-02-15 DIAGNOSIS — Z20822 Contact with and (suspected) exposure to covid-19: Secondary | ICD-10-CM | POA: Diagnosis not present

## 2022-02-15 DIAGNOSIS — K5720 Diverticulitis of large intestine with perforation and abscess without bleeding: Secondary | ICD-10-CM | POA: Diagnosis not present

## 2022-02-15 DIAGNOSIS — B9562 Methicillin resistant Staphylococcus aureus infection as the cause of diseases classified elsewhere: Secondary | ICD-10-CM | POA: Diagnosis not present

## 2022-02-15 DIAGNOSIS — M86362 Chronic multifocal osteomyelitis, left tibia and fibula: Secondary | ICD-10-CM | POA: Diagnosis not present

## 2022-02-15 NOTE — Telephone Encounter (Signed)
Received message from Abigail with [facility] regarding orders for pull picc. Confirmed with Dr. Tommy Medal that is okay to pull picc before giving order. ?Home Health was updated. ?Leatrice Jewels, RMA  ? ?

## 2022-02-16 DIAGNOSIS — M84462A Pathological fracture, left tibia, initial encounter for fracture: Secondary | ICD-10-CM | POA: Diagnosis not present

## 2022-02-16 DIAGNOSIS — T84623A Infection and inflammatory reaction due to internal fixation device of left tibia, initial encounter: Secondary | ICD-10-CM | POA: Diagnosis not present

## 2022-02-16 DIAGNOSIS — C342 Malignant neoplasm of middle lobe, bronchus or lung: Secondary | ICD-10-CM | POA: Diagnosis not present

## 2022-02-16 DIAGNOSIS — B9562 Methicillin resistant Staphylococcus aureus infection as the cause of diseases classified elsewhere: Secondary | ICD-10-CM | POA: Diagnosis not present

## 2022-02-16 DIAGNOSIS — M86362 Chronic multifocal osteomyelitis, left tibia and fibula: Secondary | ICD-10-CM | POA: Diagnosis not present

## 2022-02-16 DIAGNOSIS — K5720 Diverticulitis of large intestine with perforation and abscess without bleeding: Secondary | ICD-10-CM | POA: Diagnosis not present

## 2022-02-17 ENCOUNTER — Encounter (HOSPITAL_BASED_OUTPATIENT_CLINIC_OR_DEPARTMENT_OTHER): Payer: Medicare Other | Attending: Internal Medicine | Admitting: Internal Medicine

## 2022-02-17 DIAGNOSIS — J449 Chronic obstructive pulmonary disease, unspecified: Secondary | ICD-10-CM | POA: Insufficient documentation

## 2022-02-17 DIAGNOSIS — L89150 Pressure ulcer of sacral region, unstageable: Secondary | ICD-10-CM | POA: Insufficient documentation

## 2022-02-17 DIAGNOSIS — Z933 Colostomy status: Secondary | ICD-10-CM | POA: Insufficient documentation

## 2022-02-17 DIAGNOSIS — Z87891 Personal history of nicotine dependence: Secondary | ICD-10-CM | POA: Diagnosis not present

## 2022-02-17 DIAGNOSIS — J9611 Chronic respiratory failure with hypoxia: Secondary | ICD-10-CM | POA: Diagnosis not present

## 2022-02-17 DIAGNOSIS — C342 Malignant neoplasm of middle lobe, bronchus or lung: Secondary | ICD-10-CM | POA: Diagnosis not present

## 2022-02-17 DIAGNOSIS — B9562 Methicillin resistant Staphylococcus aureus infection as the cause of diseases classified elsewhere: Secondary | ICD-10-CM | POA: Diagnosis not present

## 2022-02-17 DIAGNOSIS — Z9981 Dependence on supplemental oxygen: Secondary | ICD-10-CM | POA: Diagnosis not present

## 2022-02-17 DIAGNOSIS — M84462A Pathological fracture, left tibia, initial encounter for fracture: Secondary | ICD-10-CM | POA: Diagnosis not present

## 2022-02-17 DIAGNOSIS — K5720 Diverticulitis of large intestine with perforation and abscess without bleeding: Secondary | ICD-10-CM | POA: Diagnosis not present

## 2022-02-17 DIAGNOSIS — T84623A Infection and inflammatory reaction due to internal fixation device of left tibia, initial encounter: Secondary | ICD-10-CM | POA: Diagnosis not present

## 2022-02-17 DIAGNOSIS — M86362 Chronic multifocal osteomyelitis, left tibia and fibula: Secondary | ICD-10-CM | POA: Diagnosis not present

## 2022-02-17 NOTE — Progress Notes (Signed)
Eisenberger, Nawal N. ([id number]) ?Visit Report for 02/17/2022 ?Chief Complaint Document Details ?Patient Name: Date of Service: ?Holly [name], A NN N. 02/17/2022 9:45 A M ?Medical Record Number: [id number] ?Patient Account Number: [id number] ?Date of Birth/Sex: Treating RN: ?1945/11/07 (76 y.o. F) Deaton, Bobbi ?Primary Care Provider: Harlan Stains Other Clinician: ?Referring Provider: ?Treating Provider/Extender: Kalman Shan ?Harlan Stains ?Weeks in Treatment: 0 ?Information Obtained from: Patient ?Chief Complaint ?02/17/2022; sacral decubitus ulcer ?Electronic Signature(s) ?Signed: 02/17/2022 11:49:25 AM By: Kalman Shan DO ?Entered By: Kalman Shan on 02/17/2022 11:18:14 ?-------------------------------------------------------------------------------- ?Debridement Details ?Patient Name: Date of Service: ?Holly [name], A NN N. 02/17/2022 9:45 A M ?Medical Record Number: [id number] ?Patient Account Number: [id number] ?Date of Birth/Sex: Treating RN: ?1946-08-07 (76 y.o. F) Deaton, Bobbi ?Primary Care Provider: Harlan Stains Other Clinician: ?Referring Provider: ?Treating Provider/Extender: Kalman Shan ?Harlan Stains ?Weeks in Treatment: 0 ?Debridement Performed for Assessment: Wound #2 Sacrum ?Performed By: Clinician [name], RN ?Debridement Type: Chemical/Enzymatic/Mechanical ?Agent Used: Santyl ?Level of Consciousness (Pre-procedure): Awake and Alert ?Pre-procedure Verification/Time Out No ?Taken: ?Pain Control: Lidocaine 5% topical ointment ?Bleeding: None ?Response to Treatment: Procedure was tolerated well ?Level of Consciousness (Post- Awake and Alert ?procedure): ?Post Debridement Measurements of Total Wound ?Length: (cm) 3 ?Stage: Unstageable/Unclassified ?Width: (cm) 2.7 ?Depth: (cm) 0.9 ?Volume: (cm?) 5.726 ?Character of Wound/Ulcer Post Debridement: Requires Further Debridement ?Post Procedure Diagnosis ?Same as Pre-procedure ?Electronic Signature(s) ?Signed: 02/17/2022 11:49:25 AM By: Kalman Shan  DO ?Signed: 02/17/2022 5:16:25 PM By: [name] RN, BSN ?Entered By: [name] on 02/17/2022 11:00:38 ?-------------------------------------------------------------------------------- ?HPI Details ?Patient Name: Date of Service: ?Holly [name], A NN N. 02/17/2022 9:45 A M ?Medical Record Number: [id number] ?Patient Account Number: [id number] ?Date of Birth/Sex: Treating RN: ?1946-09-11 (76 y.o. F) Deaton, Bobbi ?Primary Care Provider: Harlan Stains Other Clinician: ?Referring Provider: ?Treating Provider/Extender: Kalman Shan ?Harlan Stains ?Weeks in Treatment: 0 ?History of Present Illness ?HPI Description: Admission 02/17/2022 ?Chenell Lozon is a 76 year old female with a past medical history of COPD on chronic O2 via nasal cannula, lung cancer and perforated diverticulitis with ?diverting colostomy that presents to the clinic for 1 month history of nonhealing sacral ulcer. She was hospitalized on 3/29 for a ruptured diverticulum And ?developed a sacral ulcer during that hospitalization. She started using Medihoney at that time and is currently using medihoney infused silver alginate. She is ?currently at home and she sits and sleeps in a recliner. While she is in her wheelchair she uses a Financial trader. She currently denies signs of infection. ?Electronic Signature(s) ?Signed: 02/17/2022 11:49:25 AM By: Kalman Shan DO ?Entered By: Kalman Shan on 02/17/2022 11:22:58 ?-------------------------------------------------------------------------------- ?Physical Exam Details ?Patient Name: Date of Service: ?Holly [name], A NN N. 02/17/2022 9:45 A M ?Medical Record Number: [id number] ?Patient Account Number: [id number] ?Date of Birth/Sex: Treating RN: ?1946/06/09 (76 y.o. F) Deaton, Bobbi ?Primary Care Provider: Harlan Stains Other Clinician: ?Referring Provider: ?Treating Provider/Extender: Kalman Shan ?Harlan Stains ?Weeks in Treatment: 0 ?Constitutional ?respirations regular, non-labored and within target range  for patient.Marland Kitchen ?Psychiatric ?pleasant and cooperative. ?Notes ?Sacrum: Open wound with nonviable surface throughout the wound bed. Surrounding skin with irritation. No obvious signs of surrounding infection. ?Electronic Signature(s) ?Signed: 02/17/2022 11:49:25 AM By: Kalman Shan DO ?Entered By: Kalman Shan on 02/17/2022 11:23:37 ?-------------------------------------------------------------------------------- ?Physician Orders Details ?Patient Name: Date of Service: ?Holly [name], A NN N. 02/17/2022 9:45 A M ?Medical Record Number: [id number] ?Patient Account Number: [id number] ?Date of Birth/Sex: Treating RN: ?July 23, 1946 (76 y.o. F) Deaton, Bobbi ?Primary Care Provider: Harlan Stains Other Clinician: ?Referring Provider: ?Treating Provider/Extender:  Kalman Shan ?Harlan Stains ?Weeks in Treatment: 0 ?Verbal / Phone Orders: No ?Diagnosis Coding ?ICD-10 Coding ?Code Description ?L89.150 Pressure ulcer of sacral region, unstageable ?J44.9 Chronic obstructive pulmonary disease, unspecified ?J96.11 Chronic respiratory failure with hypoxia ?Follow-up Appointments ?ppointment in 2 weeks. - Dr. Heber Bannock and [place], Room 8 03/03/2022 0815 ?Return A ?Other: - Santyl will be sent to your pharmacy. ?Until santyl arrives continue to use medihoney alginate. ?Bathing/ Shower/ Hygiene ?May shower and wash wound with soap and water. - with dressing changes. ?Off-Loading ?Low air-loss mattress (Group 2) - medical modalities- hospital bed and air mattress. ?Turn and reposition every 2 hours ?Other: - do not sleep in a chair. Need to sleep in a bed. turn every 2 hours, you ?Home Health ?New wound care orders this week; continue Home Health for wound care. May utilize formulary equivalent dressing for wound treatment ?orders unless otherwise specified. - just closely monitor the left ankle- patient to use triple antibiotic ointment and bandage to ankle for protection. ?changing the dressing to the sacrum santyl, saline moisten gauze,  zinc oxide cream to periwound for protection and secondary covering abd pad or ?foam border. ?Other Home Health Orders/Instructions: - [facility] home health ?Wound Treatment ?Wound #2 - Sacrum ?Cleanser: Soap and Water (Home Health) 1 x Per Day/30 Days ?Discharge Instructions: May shower and wash wound with dial antibacterial soap and water prior to dressing change. ?Cleanser: Wound Cleanser (Home Health) 1 x Per Day/30 Days ?Discharge Instructions: Cleanse the wound with wound cleanser prior to applying a clean dressing using gauze sponges, not tissue or cotton balls. ?Peri-Wound Care: Skin Prep (Home Health) 1 x Per Day/30 Days ?Discharge Instructions: Use skin prep as directed ?Peri-Wound Care: Zinc Oxide Ointment 30g tube (Home Health) 1 x Per Day/30 Days ?Discharge Instructions: Apply Zinc Oxide to periwound with each dressing change ?Prim Dressing: Santyl Ointment (Home Health) 1 x Per Day/30 Days ?ary ?Discharge Instructions: Apply nickel thick amount to wound bed as instructed ?Secondary Dressing: Woven Gauze Sponge, Non-Sterile 4x4 in (Home Health) 1 x Per Day/30 Days ?Discharge Instructions: Apply saline moisten gauze lightly packed into undermining and wound bed over the santyl. ?Secondary Dressing: Zetuvit Plus Silicone Border Dressing 7x7(in/in) (Home Health) 1 x Per Day/30 Days ?Discharge Instructions: Apply silicone border over abd pad with tape. ?Secured With: 19M Medipore H Soft Cloth Surgical T ape, 4 x 10 (in/yd) (Home Health) 1 x Per Day/30 Days ?Discharge Instructions: Secure with tape as directed. ?Patient Medications ?llergies: amlodipine, codeine, oxycodone ?A ?Notifications Medication Indication Start End ?lidocaine ?DOSE topical 5 % ointment - ointment topical applied only in clinic. ?02/17/2022 ?Santyl ?DOSE 1 - topical 250 unit/gram ointment - apply daily to the wound bed ?Electronic Signature(s) ?Signed: 02/17/2022 11:49:25 AM By: Kalman Shan DO ?Previous Signature: 02/17/2022 11:25:27 AM  Version By: Kalman Shan DO ?Entered By: Kalman Shan on 02/17/2022 11:25:57 ?Prescription 02/17/2022 ?-------------------------------------------------------------------------------- ?Florence Canner, Geana N.

## 2022-02-17 NOTE — Progress Notes (Addendum)
Holly [name], Holly [name] Kitchen ([id number]) Visit Report for 02/17/2022 Allergy List Details Patient Name: Date of Service: Holly [name] NN N. 02/17/2022 9:45 Holly [name] Medical Record Number: [id number] Patient Account Number: [id number] Date of Birth/Sex: Treating RN: 09-10-1946 (76 y.o. [name] Primary Care [name] Weeks in Treatment: 0 Allergies Active Allergies amlodipine Reaction: swelling codeine Reaction: N/V oxycodone Reaction: N/V Allergy Notes Electronic Signature(s) Signed: 02/17/2022 5:16:25 PM By: [name] RN, BSN Entered By: [name] on 02/17/2022 10:05:34 -------------------------------------------------------------------------------- Arrival Information Details Patient Name: Date of Service: Holly [name], Holly NN N. 02/17/2022 9:45 Holly [name] Medical Record Number: [id number] Patient Account Number: [id number] Date of Birth/Sex: Treating RN: October 14, 1946 (76 y.o. [name], [name] Primary Care [name]: Harlan Stains Other Clinician: Referring [name]: Treating [name]/Extender: [name] in Treatment: 0 Visit Information Patient Arrived: Wheel Chair Arrival Time: 10:02 Accompanied By: daughter Transfer Assistance: Manual Patient Identification Verified: Yes Secondary Verification Process Completed: Yes Patient Requires Transmission-Based Precautions: No Patient Has Alerts: Yes Patient Alerts: Patient on Blood Thinner Electronic Signature(s) Signed: 02/17/2022 5:16:25 PM By: [name] RN, BSN Entered By: [name] on 02/17/2022 10:03:46 -------------------------------------------------------------------------------- Clinic Level of Care Assessment Details Patient Name: Date of Service: Holly [name], Holly NN N. 02/17/2022 9:45 Holly [name] Medical Record Number: [id number] Patient Account Number: [id number] Date of Birth/Sex: Treating RN: 1946-10-13  (76 y.o. [name] Primary Care [name]: Harlan Stains Other Clinician: Referring [name]: Treating [name]/Extender: Perlie Mayo Weeks in Treatment: 0 Clinic Level of Care Assessment Items TOOL 1 Quantity Score X- 1 0 Use when EandM and Procedure is performed on INITIAL visit ASSESSMENTS - Nursing Assessment / Reassessment X- 1 20 General Physical Exam (combine w/ comprehensive assessment (listed just below) when performed on new pt. evals) X- 1 25 Comprehensive Assessment (HX, ROS, Risk Assessments, Wounds Hx, etc.) ASSESSMENTS - Wound and Skin Assessment / Reassessment X- 1 10 Dermatologic / Skin Assessment (not related to wound area) ASSESSMENTS - Ostomy and/or Continence Assessment and Care []  - 0 Incontinence Assessment and Management []  - 0 Ostomy Care Assessment and Management (repouching, etc.) PROCESS - Coordination of Care []  - 0 Simple Patient / Family Education for ongoing care X- 1 20 Complex (extensive) Patient / Family Education for ongoing care X- 1 10 Staff obtains Programmer, systems, Records, T Results / Process Orders est X- 1 10 Staff telephones HHA, Nursing Homes / Clarify orders / etc []  - 0 Routine Transfer to another Facility (non-emergent condition) []  - 0 Routine Hospital Admission (non-emergent condition) X- 1 15 New Admissions / Biomedical engineer / Ordering NPWT Apligraf, etc. , []  - 0 Emergency Hospital Admission (emergent condition) PROCESS - Special Needs []  - 0 Pediatric / Minor Patient Management []  - 0 Isolation Patient Management []  - 0 Hearing / Language / Visual special needs []  - 0 Assessment of Community assistance (transportation, D/C planning, etc.) []  - 0 Additional assistance / Altered mentation []  - 0 Support Surface(s) Assessment (bed, cushion, seat, etc.) INTERVENTIONS - Miscellaneous []  - 0 External ear exam []  - 0 Patient Transfer (multiple staff / Civil Service fast streamer / Similar devices) []   - 0 Simple Staple / Suture removal (25 or less) []  - 0 Complex Staple / Suture removal (26 or more) []  - 0 Hypo/Hyperglycemic Management (do not check if billed separately) X- 1 15 Ankle / Brachial Index (ABI) - do not check if billed separately Has the patient been seen  at the hospital within the last three years: Yes Total Score: 125 Level Of Care: New/Established - Level 4 Electronic Signature(s) Signed: 02/17/2022 5:16:25 PM By: [name] RN, BSN Signed: 02/17/2022 5:16:25 PM By: [name] RN, BSN Entered By: [name] on 02/17/2022 11:09:51 -------------------------------------------------------------------------------- Encounter Discharge Information Details Patient Name: Date of Service: Holly [name], Holly NN N. 02/17/2022 9:45 Holly [name] Medical Record Number: [id number] Patient Account Number: [id number] Date of Birth/Sex: Treating RN: 02/11/1946 (76 y.o. [name], [name] Primary Care Mahdiya Mossberg: Harlan Stains Other Clinician: Referring Sabryn Preslar: Treating Shandelle Borrelli/Extender: Perlie Mayo Weeks in Treatment: 0 Encounter Discharge Information Items Post Procedure Vitals Discharge Condition: Stable Temperature (F): 97.8 Ambulatory Status: Wheelchair Pulse (bpm): 72 Discharge Destination: Home Respiratory Rate (breaths/min): 20 Transportation: Private Auto Blood Pressure (mmHg): 137/66 Accompanied By: daughter Schedule Follow-up Appointment: Yes Clinical Summary of Care: Electronic Signature(s) Signed: 02/17/2022 5:16:25 PM By: [name] RN, BSN Entered By: [name] on 02/17/2022 11:10:37 -------------------------------------------------------------------------------- Lower Extremity Assessment Details Patient Name: Date of Service: Holly [name], Holly NN N. 02/17/2022 9:45 Holly [name] Medical Record Number: [id number] Patient Account Number: [id number] Date of Birth/Sex: Treating RN: July 30, 1946 (76 y.o. [name], Meta.Reding Primary Care Velecia Ovitt: Harlan Stains Other Clinician: Referring Maridee Slape: Treating Jveon Pound/Extender: Perlie Mayo Weeks in Treatment: 0 Edema Assessment Assessed: [Left: Yes] [Right: No] Edema: [Left: Ye] [Right: s] Calf Left: Right: Point of Measurement: 11 cm From Medial Instep 26.5 cm Ankle Left: Right: Point of Measurement: 6 cm From Medial Instep 20.5 cm Knee To Floor Left: Right: From Medial Instep 27 cm Vascular Assessment Pulses: Dorsalis Pedis Palpable: [Left:Yes] Doppler Audible: [Left:Yes] Posterior Tibial Palpable: [Left:Yes] Doppler Audible: [Left:Yes] Blood Pressure: Brachial: [Left:137] Ankle: [Left:Dorsalis Pedis: 128 0.93] Electronic Signature(s) Signed: 02/17/2022 5:16:25 PM By: [name] RN, BSN Entered By: [name] on 02/17/2022 10:25:26 -------------------------------------------------------------------------------- Multi Wound Chart Details Patient Name: Date of Service: Holly [name], Holly NN N. 02/17/2022 9:45 Holly [name] Medical Record Number: [id number] Patient Account Number: [id number] Date of Birth/Sex: Treating RN: 03-Nov-1945 (76 y.o. [name], Meta.Reding Primary Care Meric Joye: Harlan Stains Other Clinician: Referring Charleston Hankin: Treating Temperance Kelemen/Extender: Perlie Mayo Weeks in Treatment: 0 Vital Signs Height(in): 60 Pulse(bpm): 72 Weight(lbs): 128 Blood Pressure(mmHg): 137/66 Body Mass Index(BMI): 25 Temperature(F): 97.8 Respiratory Rate(breaths/min): 20 Photos: [N/Holly:N/Holly] Sacrum N/Holly N/Holly Wound Location: Pressure Injury N/Holly N/Holly Wounding Event: Pressure Ulcer N/Holly N/Holly Primary Etiology: 01/03/2022 N/Holly N/Holly Date Acquired: 0 N/Holly N/Holly Weeks of Treatment: Open N/Holly N/Holly Wound Status: No N/Holly N/Holly Wound Recurrence: 3x2.7x0.9 N/Holly N/Holly Measurements L x W x D (cm) 6.362 N/Holly N/Holly Holly (cm) : rea 5.726 N/Holly N/Holly Volume (cm) : 0.00% N/Holly N/Holly % Reduction in Holly rea: 0.00% N/Holly N/Holly % Reduction in Volume: 9 Starting Position 1  (o'clock): 3 Ending Position 1 (o'clock): 2 Maximum Distance 1 (cm): Yes N/Holly N/Holly Undermining: Unstageable/Unclassified N/Holly N/Holly Classification: Medium N/Holly N/Holly Exudate Holly mount: Serosanguineous N/Holly N/Holly Exudate Type: red, brown N/Holly N/Holly Exudate Color: Thickened N/Holly N/Holly Wound Margin: None Present (0%) N/Holly N/Holly Granulation Holly mount: Large (67-100%) N/Holly N/Holly Necrotic Holly mount: Fat Layer (Subcutaneous Tissue): Yes N/Holly N/Holly Exposed Structures: Fascia: No Tendon: No Muscle: No Joint: No Bone: No Small (1-33%) N/Holly N/Holly Epithelialization: Chemical/Enzymatic/Mechanical - N/Holly N/Holly Debridement: Excisional Lidocaine 5% topical ointment N/Holly N/Holly Pain Control: Subcutaneous, Slough N/Holly N/Holly Tissue Debrided: N/Holly N/Holly N/Holly Instrument: None N/Holly N/Holly Bleeding: Debridement Treatment Response: Procedure was tolerated well N/Holly N/Holly Post Debridement Measurements L x 3x2.7x0.9 N/Holly N/Holly  W x D (cm) 5.726 N/Holly N/Holly Post Debridement Volume: (cm) Unstageable/Unclassified N/Holly N/Holly Post Debridement Stage: Debridement N/Holly N/Holly Procedures Performed: Treatment Notes Wound #2 (Sacrum) Cleanser Soap and Water Discharge Instruction: May shower and wash wound with dial antibacterial soap and water prior to dressing change. Wound Cleanser Discharge Instruction: Cleanse the wound with wound cleanser prior to applying Holly clean dressing using gauze sponges, not tissue or cotton balls. Peri-Wound Care Skin Prep Discharge Instruction: Use skin prep as directed Zinc Oxide Ointment 30g tube Discharge Instruction: Apply Zinc Oxide to periwound with each dressing change Topical Primary Dressing Santyl Ointment Discharge Instruction: Apply nickel thick amount to wound bed as instructed Secondary Dressing Woven Gauze Sponge, Non-Sterile 4x4 in Discharge Instruction: Apply saline moisten gauze lightly packed into undermining and wound bed over the santyl. Zetuvit Plus Silicone Border Dressing 7x7(in/in) Discharge  Instruction: Apply silicone border over abd pad with tape. Secured With 82M Medipore H Soft Cloth Surgical T ape, 4 x 10 (in/yd) Discharge Instruction: Secure with tape as directed. Compression Wrap Compression Stockings Add-Ons Electronic Signature(s) Signed: 02/17/2022 11:49:25 AM By: Kalman Shan DO Signed: 02/17/2022 5:16:25 PM By: [name] RN, BSN Entered By: Kalman Shan on 02/17/2022 11:17:54 -------------------------------------------------------------------------------- Multi-Disciplinary Care Plan Details Patient Name: Date of Service: Holly [name], Holly NN N. 02/17/2022 9:45 Holly [name] Medical Record Number: [id number] Patient Account Number: [id number] Date of Birth/Sex: Treating RN: 02/20/1946 (76 y.o. [name] Primary Care Kaipo Ardis: Harlan Stains Other Clinician: Referring Shogo Larkey: Treating Lataysha Vohra/Extender: Perlie Mayo Weeks in Treatment: 0 Active Inactive Electronic Signature(s) Signed: 2022/04/06 3:56:48 PM By: [name] RN, BSN Previous Signature: 02/17/2022 5:16:25 PM Version By: [name] RN, BSN Entered By: [name] on 06-Apr-2022 15:56:48 -------------------------------------------------------------------------------- Pain Assessment Details Patient Name: Date of Service: Holly [name], Holly NN N. 02/17/2022 9:45 Holly [name] Medical Record Number: [id number] Patient Account Number: [id number] Date of Birth/Sex: Treating RN: [birth year] (76 y.o. [name] Primary Care Margorie Renner: Harlan Stains Other Clinician: Referring Gordon Carlson: Treating Idil Maslanka/Extender: Perlie Mayo Weeks in Treatment: 0 Active Problems Location of Pain Severity and Description of Pain Patient Has Paino Yes Site Locations Pain Location: Generalized Pain, Pain in Ulcers Rate the pain. Current Pain Level: 7 Worst Pain Level: 9 Least Pain Level: 0 Tolerable Pain Level: 8 Pain Management and Medication Current Pain  Management: Medication: No Cold Application: No Rest: No Massage: No Activity: No T.E.RomanS.: No Heat Application: No Leg drop or elevation: No Is the Current Pain Management Adequate: Adequate How does your wound impact your activities of daily livingo Sleep: No Bathing: No Appetite: No Relationship With Others: No Bladder Continence: No Emotions: No Bowel Continence: No Work: No Toileting: No Drive: No Dressing: No Hobbies: No Engineer, maintenance) Signed: 02/17/2022 5:16:25 PM By: [name] RN, BSN Entered By: [name] on 02/17/2022 10:08:09 -------------------------------------------------------------------------------- Patient/Caregiver Education Details Patient Name: Date of Service: Holly [name] NN N. 5/4/2023andnbsp9:45 Holly [name] Medical Record Number: [id number] Patient Account Number: [id number] Date of Birth/Gender: Treating RN: Jan 02, 1946 (76 y.o. [name] Primary Care Physician: Harlan Stains Other Clinician: Referring Physician: Treating Physician/Extender: [name] in Treatment: 0 Education Assessment Education Provided To: Patient Education Topics Provided Pressure: Handouts: Pressure Ulcers: Care and Offloading, Pressure Ulcers: Care and Offloading 2, Preventing Pressure Ulcers Methods: Explain/Verbal, Printed Responses: Reinforcements needed [place]: o Handouts: Welcome T [facility] o Methods: Explain/Verbal, Printed Responses: Reinforcements needed Electronic Signature(s) Signed: 02/17/2022 5:16:25 PM By:  [name] RN, BSN Entered By: [name] on 02/17/2022 10:49:58 -------------------------------------------------------------------------------- Wound Assessment Details Patient Name: Date of Service: Holly [name] NN N. 02/17/2022 9:45 Holly [name] Medical Record Number: [id number] Patient Account Number: [id number] Date of Birth/Sex: Treating RN: 29-Sep-1946 (76 y.o. [name], Meta.Reding Primary Care Clois Montavon: Harlan Stains Other Clinician: Referring Ellana Kawa: Treating Nakai Yard/Extender: Perlie Mayo Weeks in Treatment: 0 Wound Status Wound Number: 2 Primary Etiology: Pressure Ulcer Wound Location: Sacrum Wound Status: Open Wounding Event: Pressure Injury Date Acquired: 01/03/2022 Weeks Of Treatment: 0 Clustered Wound: No Photos Wound Measurements Length: (cm) 3 Width: (cm) 2.7 Depth: (cm) 0.9 Area: (cm) 6.362 Volume: (cm) 5.726 % Reduction in Area: 0% % Reduction in Volume: 0% Epithelialization: Small (1-33%) Tunneling: No Undermining: Yes Starting Position (o'clock): 9 Ending Position (o'clock): 3 Maximum Distance: (cm) 2 Wound Description Classification: Unstageable/Unclassified Wound Margin: Thickened Exudate Amount: Medium Exudate Type: Serosanguineous Exudate Color: red, brown Foul Odor After Cleansing: No Slough/Fibrino Yes Wound Bed Granulation Amount: None Present (0%) Exposed Structure Necrotic Amount: Large (67-100%) Fascia Exposed: No Necrotic Quality: Adherent Slough Fat Layer (Subcutaneous Tissue) Exposed: Yes Tendon Exposed: No Muscle Exposed: No Joint Exposed: No Bone Exposed: No Electronic Signature(s) Signed: 02/17/2022 5:16:25 PM By: [name] RN, BSN Entered By: [name] on 02/17/2022 10:37:54 -------------------------------------------------------------------------------- Vitals Details Patient Name: Date of Service: Holly [name], Holly NN N. 02/17/2022 9:45 Holly [name] Medical Record Number: [id number] Patient Account Number: [id number] Date of Birth/Sex: Treating RN: 28-Nov-1945 (76 y.o. [name], Meta.Reding Primary Care Mavrik Bynum: Harlan Stains Other Clinician: Referring Jesselle Laflamme: Treating Nickcole Bralley/Extender: Perlie Mayo Weeks in Treatment: 0 Vital Signs Time Taken: 10:00 Temperature (F): 97.8 Height (in): 60 Pulse (bpm): 72 Source: Stated Respiratory Rate  (breaths/min): 20 Weight (lbs): 128 Blood Pressure (mmHg): 137/66 Source: Stated Reference Range: 80 - 120 mg / dl Body Mass Index (BMI): 25 Airway Inhaled Oxygen Concentration (%): 3 Electronic Signature(s) Signed: 02/17/2022 5:16:25 PM By: [name] RN, BSN Entered By: [name] on 02/17/2022 10:04:15

## 2022-02-17 NOTE — Progress Notes (Signed)
Peel, Tanieka N. ([id number]) ?Visit Report for 02/17/2022 ?Abuse Risk Screen Details ?Patient Name: Date of Service: ?Holly [name], A NN N. 02/17/2022 9:45 A M ?Medical Record Number: [id number] ?Patient Account Number: [id number] ?Date of Birth/Sex: Treating RN: ?[birth year] (76 y.o. F) Deaton, Bobbi ?Primary Care Sheena Simonis: Harlan Stains Other Clinician: ?Referring Natasha Paulson: ?Treating Garlene Apperson/Extender: Kalman Shan ?Harlan Stains ?Weeks in Treatment: 0 ?Abuse Risk Screen Items ?Answer ?ABUSE RISK SCREEN: ?Has anyone close to you tried to hurt or harm you recentlyo No ?Do you feel uncomfortable with anyone in your familyo No ?Has anyone forced you do things that you didnt want to doo No ?Electronic Signature(s) ?Signed: 02/17/2022 5:16:25 PM By: [name] RN, BSN ?Entered By: [name] on 02/17/2022 10:05:42 ?-------------------------------------------------------------------------------- ?Activities of Daily Living Details ?Patient Name: Date of Service: ?Holly [name], A NN N. 02/17/2022 9:45 A M ?Medical Record Number: [id number] ?Patient Account Number: [id number] ?Date of Birth/Sex: Treating RN: ?1946/06/13 (76 y.o. F) Deaton, Bobbi ?Primary Care Drae Mitzel: Harlan Stains Other Clinician: ?Referring Afua Hoots: ?Treating Valeska Haislip/Extender: Kalman Shan ?Harlan Stains ?Weeks in Treatment: 0 ?Activities of Daily Living Items ?Answer ?Activities of Daily Living (Please select one for each item) ?Drive Automobile Not Able ?T Medications ?ake Completely Able ?Use T elephone Completely Able ?Care for Appearance Need Assistance ?Use T oilet Need Assistance ?Bath / Shower Need Assistance ?Dress Self Need Assistance ?Feed Self Completely Able ?Walk Need Assistance ?Get In / Out Bed Need Assistance ?Housework Not Able ?Prepare Meals Not Able ?Handle Money Not Able ?Shop for Self Not Able ?Electronic Signature(s) ?Signed: 02/17/2022 5:16:25 PM By: [name] RN, BSN ?Entered By: [name] on 02/17/2022  10:06:24 ?-------------------------------------------------------------------------------- ?Education Screening Details ?Patient Name: Date of Service: ?Holly [name], A NN N. 02/17/2022 9:45 A M ?Medical Record Number: [id number] ?Patient Account Number: [id number] ?Date of Birth/Sex: Treating RN: ?1946/04/04 (76 y.o. F) Deaton, Bobbi ?Primary Care Quinetta Shilling: Harlan Stains Other Clinician: ?Referring Haillee Johann: ?Treating Catherine Oak/Extender: Kalman Shan ?Harlan Stains ?Weeks in Treatment: 0 ?Primary Learner Assessed: Patient ?Learning Preferences/Education Level/Primary Language ?Learning Preference: Explanation, Demonstration, Printed Material ?Highest Education Level: High School ?Preferred Language: English ?Cognitive Barrier ?Language Barrier: No ?Translator Needed: No ?Memory Deficit: No ?Emotional Barrier: No ?Cultural/Religious Beliefs Affecting Medical Care: No ?Physical Barrier ?Impaired Vision: No ?Impaired Hearing: No ?Decreased Hand dexterity: No ?Knowledge/Comprehension ?Knowledge Level: High ?Comprehension Level: High ?Ability to understand written instructions: High ?Ability to understand verbal instructions: High ?Motivation ?Anxiety Level: Calm ?Cooperation: Cooperative ?Education Importance: Acknowledges Need ?Interest in Health Problems: Asks Questions ?Perception: Coherent ?Willingness to Engage in Self-Management High ?Activities: ?Readiness to Engage in Self-Management High ?Activities: ?Notes ?daughter with patient. ?Electronic Signature(s) ?Signed: 02/17/2022 5:16:25 PM By: [name] RN, BSN ?Entered By: [name] on 02/17/2022 10:07:19 ?-------------------------------------------------------------------------------- ?Fall Risk Assessment Details ?Patient Name: Date of Service: ?Holly [name], A NN N. 02/17/2022 9:45 A M ?Medical Record Number: [id number] ?Patient Account Number: [id number] ?Date of Birth/Sex: Treating RN: ?11-14-1945 (76 y.o. F) Deaton, Bobbi ?Primary Care Hagen Tidd: Harlan Stains  Other Clinician: ?Referring Natividad Halls: ?Treating Emilene Roma/Extender: Kalman Shan ?Harlan Stains ?Weeks in Treatment: 0 ?Fall Risk Assessment Items ?Have you had 2 or more falls in the last 12 monthso 0 No ?Have you had any fall that resulted in injury in the last 12 monthso 0 No ?FALLS RISK SCREEN ?History of falling - immediate or within 3 months 0 No ?Secondary diagnosis (Do you have 2 or more medical diagnoseso) 0 No ?Ambulatory aid ?None/bed rest/wheelchair/nurse 0 Yes ?Crutches/cane/walker 15 Yes ?Furniture 0 No ?Intravenous therapy Access/Saline/Heparin Lock 0  No ?Gait/Transferring ?Normal/ bed rest/ wheelchair 0 No ?Weak (short steps with or without shuffle, stooped but able to lift head while walking, may seek 10 Yes ?support from furniture) ?Impaired (short steps with shuffle, may have difficulty arising from chair, head down, impaired 0 No ?balance) ?Mental Status ?Oriented to own ability 0 Yes ?Electronic Signature(s) ?Signed: 02/17/2022 5:16:25 PM By: [name] RN, BSN ?Entered By: [name] on 02/17/2022 10:07:40 ?-------------------------------------------------------------------------------- ?Foot Assessment Details ?Patient Name: ?Date of Service: ?Holly [name], A NN N. 02/17/2022 9:45 A M ?Medical Record Number: [id number] ?Patient Account Number: [id number] ?Date of Birth/Sex: ?Treating RN: ?December 15, 1945 (76 y.o. F) Deaton, Bobbi ?Primary Care Ileen Kahre: Harlan Stains ?Other Clinician: ?Referring Loranda Mastel: ?Treating Kaylan Yates/Extender: Kalman Shan ?Harlan Stains ?Weeks in Treatment: 0 ?Foot Assessment Items ?Site Locations ?+ = Sensation present, - = Sensation absent, C = Callus, U = Ulcer ?R = Redness, W = Warmth, M = Maceration, PU = Pre-ulcerative lesion ?F = Fissure, S = Swelling, D = Dryness ?Assessment ?Right: Left: ?Other Deformity: No No ?Prior Foot Ulcer: No No ?Prior Amputation: No No ?Charcot Joint: No No ?Ambulatory Status: Ambulatory With Help ?Assistance Device: Walker ?Gait:  Unsteady ?Electronic Signature(s) ?Signed: 02/17/2022 5:16:25 PM By: [name] RN, BSN ?Entered By: [name] on 02/17/2022 10:26:14 ?-------------------------------------------------------------------------------- ?Nutrition Risk Screening Details ?Patient Name: ?Date of Service: ?Holly [name], A NN N. 02/17/2022 9:45 A M ?Medical Record Number: [id number] ?Patient Account Number: [id number] ?Date of Birth/Sex: ?Treating RN: ?06-18-1946 (76 y.o. F) Deaton, Bobbi ?Primary Care Coley Littles: Harlan Stains ?Other Clinician: ?Referring Icarus Partch: ?Treating Damel Querry/Extender: Kalman Shan ?Harlan Stains ?Weeks in Treatment: 0 ?Height (in): 60 ?Weight (lbs): 128 ?Body Mass Index (BMI): 25 ?Nutrition Risk Screening Items ?Score Screening ?NUTRITION RISK SCREEN: ?I have an illness or condition that made me change the kind and/or amount of food I eat 2 Yes ?I eat fewer than two meals per day 0 No ?I eat few fruits and vegetables, or milk products 0 No ?I have three or more drinks of beer, liquor or wine almost every day 0 No ?I have tooth or mouth problems that make it hard for me to eat 0 No ?I don't always have enough money to buy the food I need 0 No ?I eat alone most of the time 0 No ?I take three or more different prescribed or over-the-counter drugs a day 1 Yes ?Without wanting to, I have lost or gained 10 pounds in the last six months 0 No ?I am not always physically able to shop, cook and/or feed myself 0 No ?Nutrition Protocols ?Good Risk Protocol ?Moderate Risk Protocol 0 Provide education on nutrition ?High Risk Proctocol ?Risk Level: Moderate Risk ?Score: 3 ?Electronic Signature(s) ?Signed: 02/17/2022 5:16:25 PM By: [name] RN, BSN ?Entered By: [name] on 02/17/2022 10:07:50 ?

## 2022-02-21 DIAGNOSIS — C342 Malignant neoplasm of middle lobe, bronchus or lung: Secondary | ICD-10-CM | POA: Diagnosis not present

## 2022-02-21 DIAGNOSIS — M86362 Chronic multifocal osteomyelitis, left tibia and fibula: Secondary | ICD-10-CM | POA: Diagnosis not present

## 2022-02-21 DIAGNOSIS — T84623A Infection and inflammatory reaction due to internal fixation device of left tibia, initial encounter: Secondary | ICD-10-CM | POA: Diagnosis not present

## 2022-02-21 DIAGNOSIS — B9562 Methicillin resistant Staphylococcus aureus infection as the cause of diseases classified elsewhere: Secondary | ICD-10-CM | POA: Diagnosis not present

## 2022-02-21 DIAGNOSIS — M84462A Pathological fracture, left tibia, initial encounter for fracture: Secondary | ICD-10-CM | POA: Diagnosis not present

## 2022-02-21 DIAGNOSIS — K5720 Diverticulitis of large intestine with perforation and abscess without bleeding: Secondary | ICD-10-CM | POA: Diagnosis not present

## 2022-02-23 DIAGNOSIS — B9562 Methicillin resistant Staphylococcus aureus infection as the cause of diseases classified elsewhere: Secondary | ICD-10-CM | POA: Diagnosis not present

## 2022-02-23 DIAGNOSIS — C342 Malignant neoplasm of middle lobe, bronchus or lung: Secondary | ICD-10-CM | POA: Diagnosis not present

## 2022-02-23 DIAGNOSIS — M84462A Pathological fracture, left tibia, initial encounter for fracture: Secondary | ICD-10-CM | POA: Diagnosis not present

## 2022-02-23 DIAGNOSIS — T84623A Infection and inflammatory reaction due to internal fixation device of left tibia, initial encounter: Secondary | ICD-10-CM | POA: Diagnosis not present

## 2022-02-23 DIAGNOSIS — K5720 Diverticulitis of large intestine with perforation and abscess without bleeding: Secondary | ICD-10-CM | POA: Diagnosis not present

## 2022-02-23 DIAGNOSIS — M86362 Chronic multifocal osteomyelitis, left tibia and fibula: Secondary | ICD-10-CM | POA: Diagnosis not present

## 2022-02-24 DIAGNOSIS — B9562 Methicillin resistant Staphylococcus aureus infection as the cause of diseases classified elsewhere: Secondary | ICD-10-CM | POA: Diagnosis not present

## 2022-02-24 DIAGNOSIS — K5720 Diverticulitis of large intestine with perforation and abscess without bleeding: Secondary | ICD-10-CM | POA: Diagnosis not present

## 2022-02-24 DIAGNOSIS — M86362 Chronic multifocal osteomyelitis, left tibia and fibula: Secondary | ICD-10-CM | POA: Diagnosis not present

## 2022-02-24 DIAGNOSIS — T84623A Infection and inflammatory reaction due to internal fixation device of left tibia, initial encounter: Secondary | ICD-10-CM | POA: Diagnosis not present

## 2022-02-24 DIAGNOSIS — C342 Malignant neoplasm of middle lobe, bronchus or lung: Secondary | ICD-10-CM | POA: Diagnosis not present

## 2022-02-24 DIAGNOSIS — M84462A Pathological fracture, left tibia, initial encounter for fracture: Secondary | ICD-10-CM | POA: Diagnosis not present

## 2022-02-25 DIAGNOSIS — M940 Chondrocostal junction syndrome [Tietze]: Secondary | ICD-10-CM | POA: Diagnosis not present

## 2022-02-26 DIAGNOSIS — M84462A Pathological fracture, left tibia, initial encounter for fracture: Secondary | ICD-10-CM | POA: Diagnosis not present

## 2022-02-26 DIAGNOSIS — T84623A Infection and inflammatory reaction due to internal fixation device of left tibia, initial encounter: Secondary | ICD-10-CM | POA: Diagnosis not present

## 2022-02-26 DIAGNOSIS — C342 Malignant neoplasm of middle lobe, bronchus or lung: Secondary | ICD-10-CM | POA: Diagnosis not present

## 2022-02-26 DIAGNOSIS — M86362 Chronic multifocal osteomyelitis, left tibia and fibula: Secondary | ICD-10-CM | POA: Diagnosis not present

## 2022-02-26 DIAGNOSIS — K5720 Diverticulitis of large intestine with perforation and abscess without bleeding: Secondary | ICD-10-CM | POA: Diagnosis not present

## 2022-02-26 DIAGNOSIS — B9562 Methicillin resistant Staphylococcus aureus infection as the cause of diseases classified elsewhere: Secondary | ICD-10-CM | POA: Diagnosis not present

## 2022-02-28 DIAGNOSIS — G4730 Sleep apnea, unspecified: Secondary | ICD-10-CM | POA: Diagnosis not present

## 2022-02-28 DIAGNOSIS — H269 Unspecified cataract: Secondary | ICD-10-CM | POA: Diagnosis not present

## 2022-02-28 DIAGNOSIS — Z433 Encounter for attention to colostomy: Secondary | ICD-10-CM | POA: Diagnosis not present

## 2022-02-28 DIAGNOSIS — I82A12 Acute embolism and thrombosis of left axillary vein: Secondary | ICD-10-CM | POA: Diagnosis not present

## 2022-02-28 DIAGNOSIS — I129 Hypertensive chronic kidney disease with stage 1 through stage 4 chronic kidney disease, or unspecified chronic kidney disease: Secondary | ICD-10-CM | POA: Diagnosis not present

## 2022-02-28 DIAGNOSIS — Z7984 Long term (current) use of oral hypoglycemic drugs: Secondary | ICD-10-CM | POA: Diagnosis not present

## 2022-02-28 DIAGNOSIS — L89153 Pressure ulcer of sacral region, stage 3: Secondary | ICD-10-CM | POA: Diagnosis not present

## 2022-02-28 DIAGNOSIS — E782 Mixed hyperlipidemia: Secondary | ICD-10-CM | POA: Diagnosis not present

## 2022-02-28 DIAGNOSIS — F419 Anxiety disorder, unspecified: Secondary | ICD-10-CM | POA: Diagnosis not present

## 2022-02-28 DIAGNOSIS — M84462D Pathological fracture, left tibia, subsequent encounter for fracture with routine healing: Secondary | ICD-10-CM | POA: Diagnosis not present

## 2022-02-28 DIAGNOSIS — J9611 Chronic respiratory failure with hypoxia: Secondary | ICD-10-CM | POA: Diagnosis not present

## 2022-02-28 DIAGNOSIS — R350 Frequency of micturition: Secondary | ICD-10-CM | POA: Diagnosis not present

## 2022-02-28 DIAGNOSIS — C3412 Malignant neoplasm of upper lobe, left bronchus or lung: Secondary | ICD-10-CM | POA: Diagnosis not present

## 2022-02-28 DIAGNOSIS — M86362 Chronic multifocal osteomyelitis, left tibia and fibula: Secondary | ICD-10-CM | POA: Diagnosis not present

## 2022-02-28 DIAGNOSIS — E039 Hypothyroidism, unspecified: Secondary | ICD-10-CM | POA: Diagnosis not present

## 2022-02-28 DIAGNOSIS — I739 Peripheral vascular disease, unspecified: Secondary | ICD-10-CM | POA: Diagnosis not present

## 2022-02-28 DIAGNOSIS — M542 Cervicalgia: Secondary | ICD-10-CM | POA: Diagnosis not present

## 2022-02-28 DIAGNOSIS — C342 Malignant neoplasm of middle lobe, bronchus or lung: Secondary | ICD-10-CM | POA: Diagnosis not present

## 2022-02-28 DIAGNOSIS — F32A Depression, unspecified: Secondary | ICD-10-CM | POA: Diagnosis not present

## 2022-02-28 DIAGNOSIS — N1832 Chronic kidney disease, stage 3b: Secondary | ICD-10-CM | POA: Diagnosis not present

## 2022-02-28 DIAGNOSIS — K5730 Diverticulosis of large intestine without perforation or abscess without bleeding: Secondary | ICD-10-CM | POA: Diagnosis not present

## 2022-02-28 DIAGNOSIS — D630 Anemia in neoplastic disease: Secondary | ICD-10-CM | POA: Diagnosis not present

## 2022-02-28 DIAGNOSIS — Z9981 Dependence on supplemental oxygen: Secondary | ICD-10-CM | POA: Diagnosis not present

## 2022-02-28 DIAGNOSIS — Z7952 Long term (current) use of systemic steroids: Secondary | ICD-10-CM | POA: Diagnosis not present

## 2022-02-28 DIAGNOSIS — J449 Chronic obstructive pulmonary disease, unspecified: Secondary | ICD-10-CM | POA: Diagnosis not present

## 2022-03-01 DIAGNOSIS — M4316 Spondylolisthesis, lumbar region: Secondary | ICD-10-CM | POA: Diagnosis not present

## 2022-03-01 DIAGNOSIS — M549 Dorsalgia, unspecified: Secondary | ICD-10-CM | POA: Diagnosis not present

## 2022-03-01 DIAGNOSIS — Z79899 Other long term (current) drug therapy: Secondary | ICD-10-CM | POA: Diagnosis not present

## 2022-03-01 DIAGNOSIS — K769 Liver disease, unspecified: Secondary | ICD-10-CM | POA: Diagnosis not present

## 2022-03-01 DIAGNOSIS — I10 Essential (primary) hypertension: Secondary | ICD-10-CM | POA: Diagnosis not present

## 2022-03-01 DIAGNOSIS — M545 Low back pain, unspecified: Secondary | ICD-10-CM | POA: Diagnosis not present

## 2022-03-01 DIAGNOSIS — S22080A Wedge compression fracture of T11-T12 vertebra, initial encounter for closed fracture: Secondary | ICD-10-CM | POA: Diagnosis not present

## 2022-03-03 ENCOUNTER — Encounter (HOSPITAL_BASED_OUTPATIENT_CLINIC_OR_DEPARTMENT_OTHER): Payer: Medicare Other | Admitting: Internal Medicine

## 2022-03-04 DIAGNOSIS — J9611 Chronic respiratory failure with hypoxia: Secondary | ICD-10-CM | POA: Diagnosis not present

## 2022-03-04 DIAGNOSIS — D509 Iron deficiency anemia, unspecified: Secondary | ICD-10-CM | POA: Diagnosis not present

## 2022-03-04 DIAGNOSIS — I5032 Chronic diastolic (congestive) heart failure: Secondary | ICD-10-CM | POA: Diagnosis not present

## 2022-03-04 DIAGNOSIS — E785 Hyperlipidemia, unspecified: Secondary | ICD-10-CM | POA: Diagnosis not present

## 2022-03-04 DIAGNOSIS — N183 Chronic kidney disease, stage 3 unspecified: Secondary | ICD-10-CM | POA: Diagnosis not present

## 2022-03-04 DIAGNOSIS — C3491 Malignant neoplasm of unspecified part of right bronchus or lung: Secondary | ICD-10-CM | POA: Diagnosis not present

## 2022-03-04 DIAGNOSIS — R7301 Impaired fasting glucose: Secondary | ICD-10-CM | POA: Diagnosis not present

## 2022-03-04 DIAGNOSIS — Z933 Colostomy status: Secondary | ICD-10-CM | POA: Diagnosis not present

## 2022-03-04 DIAGNOSIS — I129 Hypertensive chronic kidney disease with stage 1 through stage 4 chronic kidney disease, or unspecified chronic kidney disease: Secondary | ICD-10-CM | POA: Diagnosis not present

## 2022-03-04 DIAGNOSIS — E440 Moderate protein-calorie malnutrition: Secondary | ICD-10-CM | POA: Diagnosis not present

## 2022-03-04 DIAGNOSIS — J449 Chronic obstructive pulmonary disease, unspecified: Secondary | ICD-10-CM | POA: Diagnosis not present

## 2022-03-04 DIAGNOSIS — S22080A Wedge compression fracture of T11-T12 vertebra, initial encounter for closed fracture: Secondary | ICD-10-CM | POA: Diagnosis not present

## 2022-03-04 DIAGNOSIS — K769 Liver disease, unspecified: Secondary | ICD-10-CM | POA: Diagnosis not present

## 2022-03-05 DIAGNOSIS — Z433 Encounter for attention to colostomy: Secondary | ICD-10-CM | POA: Diagnosis not present

## 2022-03-05 DIAGNOSIS — K5730 Diverticulosis of large intestine without perforation or abscess without bleeding: Secondary | ICD-10-CM | POA: Diagnosis not present

## 2022-03-05 DIAGNOSIS — M86362 Chronic multifocal osteomyelitis, left tibia and fibula: Secondary | ICD-10-CM | POA: Diagnosis not present

## 2022-03-05 DIAGNOSIS — C342 Malignant neoplasm of middle lobe, bronchus or lung: Secondary | ICD-10-CM | POA: Diagnosis not present

## 2022-03-05 DIAGNOSIS — M84462D Pathological fracture, left tibia, subsequent encounter for fracture with routine healing: Secondary | ICD-10-CM | POA: Diagnosis not present

## 2022-03-05 DIAGNOSIS — L89153 Pressure ulcer of sacral region, stage 3: Secondary | ICD-10-CM | POA: Diagnosis not present

## 2022-03-07 ENCOUNTER — Ambulatory Visit: Payer: Medicare Other | Admitting: Internal Medicine

## 2022-03-07 ENCOUNTER — Other Ambulatory Visit: Payer: Medicare Other

## 2022-03-08 ENCOUNTER — Other Ambulatory Visit: Payer: Self-pay | Admitting: Medical Oncology

## 2022-03-08 ENCOUNTER — Inpatient Hospital Stay (HOSPITAL_BASED_OUTPATIENT_CLINIC_OR_DEPARTMENT_OTHER): Payer: Medicare Other | Admitting: Internal Medicine

## 2022-03-08 ENCOUNTER — Other Ambulatory Visit: Payer: Self-pay

## 2022-03-08 ENCOUNTER — Inpatient Hospital Stay: Payer: Medicare Other

## 2022-03-08 ENCOUNTER — Encounter: Payer: Self-pay | Admitting: Internal Medicine

## 2022-03-08 ENCOUNTER — Inpatient Hospital Stay: Payer: Medicare Other | Attending: Internal Medicine

## 2022-03-08 VITALS — BP 141/67 | HR 77 | Temp 97.1°F | Resp 16 | Wt 126.3 lb

## 2022-03-08 DIAGNOSIS — C342 Malignant neoplasm of middle lobe, bronchus or lung: Secondary | ICD-10-CM | POA: Diagnosis not present

## 2022-03-08 DIAGNOSIS — C3411 Malignant neoplasm of upper lobe, right bronchus or lung: Secondary | ICD-10-CM | POA: Diagnosis not present

## 2022-03-08 DIAGNOSIS — C3490 Malignant neoplasm of unspecified part of unspecified bronchus or lung: Secondary | ICD-10-CM

## 2022-03-08 DIAGNOSIS — R519 Headache, unspecified: Secondary | ICD-10-CM

## 2022-03-08 DIAGNOSIS — K769 Liver disease, unspecified: Secondary | ICD-10-CM | POA: Insufficient documentation

## 2022-03-08 DIAGNOSIS — C7951 Secondary malignant neoplasm of bone: Secondary | ICD-10-CM | POA: Insufficient documentation

## 2022-03-08 DIAGNOSIS — Z79899 Other long term (current) drug therapy: Secondary | ICD-10-CM | POA: Insufficient documentation

## 2022-03-08 LAB — CBC WITH DIFFERENTIAL (CANCER CENTER ONLY)
Abs Immature Granulocytes: 0.1 10*3/uL — ABNORMAL HIGH (ref 0.00–0.07)
Basophils Absolute: 0 10*3/uL (ref 0.0–0.1)
Basophils Relative: 0 %
Eosinophils Absolute: 0.3 10*3/uL (ref 0.0–0.5)
Eosinophils Relative: 2 %
HCT: 31.2 % — ABNORMAL LOW (ref 36.0–46.0)
Hemoglobin: 9 g/dL — ABNORMAL LOW (ref 12.0–15.0)
Immature Granulocytes: 1 %
Lymphocytes Relative: 10 %
Lymphs Abs: 1.3 10*3/uL (ref 0.7–4.0)
MCH: 27.4 pg (ref 26.0–34.0)
MCHC: 28.8 g/dL — ABNORMAL LOW (ref 30.0–36.0)
MCV: 94.8 fL (ref 80.0–100.0)
Monocytes Absolute: 0.4 10*3/uL (ref 0.1–1.0)
Monocytes Relative: 3 %
Neutro Abs: 10.9 10*3/uL — ABNORMAL HIGH (ref 1.7–7.7)
Neutrophils Relative %: 84 %
Platelet Count: 114 10*3/uL — ABNORMAL LOW (ref 150–400)
RBC: 3.29 MIL/uL — ABNORMAL LOW (ref 3.87–5.11)
RDW: 17.5 % — ABNORMAL HIGH (ref 11.5–15.5)
WBC Count: 13 10*3/uL — ABNORMAL HIGH (ref 4.0–10.5)
nRBC: 0 % (ref 0.0–0.2)

## 2022-03-08 LAB — CMP (CANCER CENTER ONLY)
ALT: 17 U/L (ref 0–44)
AST: 33 U/L (ref 15–41)
Albumin: 2.8 g/dL — ABNORMAL LOW (ref 3.5–5.0)
Alkaline Phosphatase: 199 U/L — ABNORMAL HIGH (ref 38–126)
Anion gap: 5 (ref 5–15)
BUN: 32 mg/dL — ABNORMAL HIGH (ref 8–23)
CO2: 33 mmol/L — ABNORMAL HIGH (ref 22–32)
Calcium: 9.7 mg/dL (ref 8.9–10.3)
Chloride: 104 mmol/L (ref 98–111)
Creatinine: 0.87 mg/dL (ref 0.44–1.00)
GFR, Estimated: >60 mL/min
Glucose, Bld: 188 mg/dL — ABNORMAL HIGH (ref 70–99)
Potassium: 4.8 mmol/L (ref 3.5–5.1)
Sodium: 142 mmol/L (ref 135–145)
Total Bilirubin: 0.3 mg/dL (ref 0.3–1.2)
Total Protein: 6.1 g/dL — ABNORMAL LOW (ref 6.5–8.1)

## 2022-03-08 MED ORDER — ACETAMINOPHEN 325 MG PO TABS: 650.0000 mg | ORAL_TABLET | Freq: Once | ORAL | Status: DC

## 2022-03-08 MED ORDER — ACETAMINOPHEN 325 MG PO TABS
650.0000 mg | ORAL_TABLET | Freq: Once | ORAL | Status: AC
  Administered 2022-03-08: 650 mg via ORAL
  Filled 2022-03-08: qty 2

## 2022-03-08 NOTE — Addendum Note (Signed)
Addended by: Tora Kindred on: 03/08/2022 04:31 PM   Modules accepted: Orders

## 2022-03-08 NOTE — Progress Notes (Signed)
[place] [phone]   [phone]  OFFICE PROGRESS NOTE  Harlan Stains, MD [place] [id number]  DIAGNOSIS: Suspicious metastatic non-small cell lung cancer initially diagnosed as stage IIB (T3, N0, M0) non-small cell lung cancer, squamous cell carcinoma presented with 2 nodules in the right upper lobe status post SBRT then progressive mass in the right middle lobe again status post SBRT in December 2022 and now presenting with suspicious bone metastasis in the left distal femur and knee area.  The patient was also found recently in May 2023 to have suspicious liver lesions  PRIOR THERAPY: None  CURRENT THERAPY: None  INTERVAL HISTORY: Holly [name] 76 y.o. female returns to the clinic today for follow-up visit accompanied by her daughter.  The patient continues to have the baseline fatigue and shortness of breath.  She is currently on home oxygen.  She was seen few months ago when she had suspicious bone lesion in the left distal femur and knee area but when the patient underwent knee surgery this lesions were inflammatory in origin and were not consistent with metastatic disease.  She was followed by observation.  She was recently complaining of severe low back pain and she underwent CT of the lumbar spine without contrast on Mar 01, 2022 and incidentally it showed numerous ill-defined hypoattenuating lesions within the visualized liver measuring up to 1.8 cm that are new from the prior exam and the findings are highly suggestive of hepatic metastatic disease giving the patient his history of lung cancer.  She also had mild inferior endplate compression fracture of the T12 with 15% vertebral body weight loss that is new from the prior exam.  The patient came today for evaluation and recommendation regarding her condition.  She denied having any other complaints and no significant nausea, vomiting, diarrhea or constipation.   She has no headache or visual changes.  MEDICAL HISTORY: Past Medical History:  Diagnosis Date   Anemia    blood transfusion November 2022   Anxiety    Asthma    Basal cell carcinoma    lung cancer.  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 Uses 2 L    Foot swelling    left- has shown to Drs.   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  MEDICATIONS:  Current Outpatient Medications  Medication Sig Dispense Refill   acetaminophen (TYLENOL) 325 MG tablet Take 2 tablets (650 mg total) by mouth 4 (four) times daily -  before meals and at bedtime.     albuterol (PROVENTIL) (2.5 MG/3ML)  0.083% nebulizer solution Take 2.5 mg by nebulization 3 (three) times daily as needed for wheezing or shortness of breath.     ALPRAZolam (XANAX) 0.5 MG tablet Take 1 tablet (0.5 mg total) by mouth 2 (two) times daily. 30 tablet 0   apixaban (ELIQUIS) 5 MG TABS tablet Take 1 tablet (5 mg total) by mouth 2 (two) times daily. 60 tablet 2   atorvastatin (LIPITOR) 20 MG tablet Take 20 mg by mouth at bedtime.      buPROPion (WELLBUTRIN XL) 300 MG 24 hr tablet Take 300 mg by mouth every morning.     busPIRone (BUSPAR) 5 MG tablet Take 1 tablet (5 mg total) by mouth 2 (two) times daily.     DAPTOmycin 500 mg in sodium  chloride 0.9 % 50 mL Inject 500 mg into the vein daily at 8 pm.     donepezil (ARICEPT) 10 MG tablet Take 1 tablet (10 mg total) by mouth at bedtime. 30 tablet 0   doxycycline (VIBRA-TABS) 100 MG tablet Take 1 tablet (100 mg total) by mouth 2 (two) times daily. To start AFTER finishing the daptomycin 60 tablet 11   Fluticasone-Umeclidin-Vilant 100-62.5-25 MCG/INH AEPB Inhale 1 puff into the lungs daily. Trelegy     leptospermum manuka honey (MEDIHONEY) PSTE paste Apply 1 application. topically daily. 15 mL 1   levothyroxine (SYNTHROID) 137 MCG tablet Take 1 tablet (137 mcg total) by mouth daily before breakfast. 30 tablet 0   losartan (COZAAR) 25 MG tablet Take 1 tablet (25 mg total) by mouth daily. 30 tablet 0   methocarbamol (ROBAXIN) 500 MG tablet Take 1 tablet (500 mg total) by mouth every 6 (six) hours as needed for muscle spasms. 90 tablet 1   metoprolol succinate (TOPROL-XL) 25 MG 24 hr tablet Take 1 tablet (25 mg total) by mouth daily. Take with or immediately following a meal. 30 tablet 0   mirtazapine (REMERON) 30 MG tablet Take 1 tablet (30 mg total) by mouth at bedtime. 30 tablet 0   Multiple Vitamin (MULTIVITAMIN WITH MINERALS) TABS tablet Take 1 tablet by mouth daily.     Nystatin (GERHARDT'S BUTT CREAM) CREA Apply 1 application. topically 2 (two) times daily. 1 each 0   oxyCODONE (OXY IR/ROXICODONE) 5 MG immediate release tablet Take 1 tablet (5 mg total) by mouth every 4 (four) hours as needed for severe pain. 30 tablet 0   pantoprazole (PROTONIX) 40 MG tablet Take 1 tablet (40 mg total) by mouth 2 (two) times daily. After 03/09/2022, take only one tablet each day. 84 tablet 1   predniSONE (DELTASONE) 20 MG tablet Take 20 mg by mouth daily. continuous     PROCTO-MED HC 2.5 % rectal cream Apply 1 application topically daily as needed for hemorrhoids.     VENTOLIN HFA 108 (90 Base) MCG/ACT inhaler INHALE 2 PUFFS INTO THE LUNGS EVERY 4 HOURS AS NEEDED FOR WHEEZING ORSHORTNESS OF BREATH  (Patient taking differently: Inhale 2 puffs into the lungs every 4 (four) hours as needed for wheezing or shortness of breath.) 8 g 0   vitamin C (ASCORBIC ACID) 500 MG tablet Take 500 mg by mouth daily.     Vitamin D, Ergocalciferol, (DRISDOL) 1.25 MG (50000 UNIT) CAPS capsule Take 1 capsule (50,000 Units total) by mouth every 7 (seven) days. (Patient taking differently: Take 50,000 Units by mouth every 7 (seven) days. Tuesdays) 5 capsule 0   No current facility-administered medications for this visit.    SURGICAL HISTORY:  Past Surgical History:  Procedure Laterality Date   BIOPSY  02/17/2021   Procedure: BIOPSY;  Surgeon: Otis Brace, MD;  Location: [facility];  Service: Gastroenterology;;   BONE BIOPSY Left 12/13/2021   Procedure: BONE BIOPSY;  Surgeon: Shona Needles, MD;  Location: [facility];  Service: Orthopedics;  Laterality: Left;   COLONOSCOPY     COLOSTOMY N/A 01/04/2022   Procedure: COLOSTOMY;  Surgeon: Stechschulte, Nickola Major, MD;  Location: [facility];  Service: General;  Laterality: N/A;   DENTAL SURGERY     ELBOW ARTHROPLASTY Right 2020   ESOPHAGOGASTRODUODENOSCOPY (EGD) WITH PROPOFOL N/A 02/17/2021   Procedure: ESOPHAGOGASTRODUODENOSCOPY (EGD) WITH PROPOFOL;  Surgeon: Otis Brace, MD;  Location: [facility];  Service: Gastroenterology;  Laterality: N/A;   ESOPHAGOGASTRODUODENOSCOPY (EGD) WITH PROPOFOL N/A 01/24/2022   Procedure: ESOPHAGOGASTRODUODENOSCOPY (EGD) WITH PROPOFOL;  Surgeon: Arta Silence, MD;  Location: [facility];  Service: Gastroenterology;  Laterality: N/A;   EYE SURGERY Right    cataract surgery   FOOT SURGERY  30 YRS AGO   BILATERAL   HARDWARE REMOVAL Right 10/02/2020   Procedure: HARDWARE REMOVAL RIGHT ELBOW;  Surgeon: Shona Needles, MD;  Location: [facility];  Service: Orthopedics;  Laterality: Right;   HEMOSTASIS CLIP PLACEMENT  01/24/2022   Procedure: HEMOSTASIS CLIP PLACEMENT;  Surgeon: Arta Silence, MD;  Location: [facility];  Service:  Gastroenterology;;   I & D EXTREMITY Right 01/13/2021   Procedure: IRRIGATION AND DEBRIDEMENT RIGHT ELBOW;  Surgeon: Shona Needles, MD;  Location: [facility];  Service: Orthopedics;  Laterality: Right;   LAPAROTOMY N/A 01/04/2022   Procedure: EXPLORATORY LAPAROTOMY;  Surgeon: Felicie Morn, MD;  Location: [facility];  Service: General;  Laterality: N/A;   ORIF ELBOW FRACTURE Right 12/06/2017   Procedure: OPEN REDUCTION INTERNAL FIXATION (ORIF) ELBOW/OLECRANON FRACTURE;  Surgeon: Shona Needles, MD;  Location: [facility];  Service: Orthopedics;  Laterality: Right;   ORIF TIBIA PLATEAU Left 12/13/2021   Procedure: OPEN REDUCTION INTERNAL FIXATION (ORIF) TIBIAL PLATEAU;  Surgeon: Shona Needles, MD;  Location: [facility];  Service: Orthopedics;  Laterality: Left;   PARTIAL COLECTOMY N/A 01/04/2022   Procedure: SIGMOID COLECTOMY;  Surgeon: Felicie Morn, MD;  Location: [place];  Service: General;  Laterality: N/A;   SKIN CANCER EXCISION     THYROIDECTOMY N/A 07/31/2014   Procedure: TOTAL THYROIDECTOMY;  Surgeon: Armandina Gemma, MD;  Location: [facility];  Service: General;  Laterality: N/A;   VAGINAL HYSTERECTOMY     VIDEO BRONCHOSCOPY WITH ENDOBRONCHIAL NAVIGATION Bilateral 06/25/2019   Procedure: VIDEO BRONCHOSCOPY WITH ENDOBRONCHIAL NAVIGATION;  Surgeon: Lajuana Matte, MD;  Location: [facility];  Service: Thoracic;  Laterality: Bilateral;    REVIEW OF SYSTEMS:  Constitutional: positive for anorexia and fatigue Eyes: negative Ears, nose, mouth, throat, and face: negative Respiratory: positive for dyspnea on exertion Cardiovascular: negative Gastrointestinal: negative Genitourinary:negative Integument/breast: negative Hematologic/lymphatic: negative Musculoskeletal:positive for back pain and bone pain Neurological: negative Behavioral/Psych: negative Endocrine: negative Allergic/Immunologic: negative   PHYSICAL EXAMINATION: General appearance: alert, cooperative, fatigued, and no distress Head:  Normocephalic, without obvious abnormality, atraumatic Neck: no adenopathy, no JVD, supple, symmetrical, trachea midline, and thyroid not enlarged, symmetric, no tenderness/mass/nodules Lymph nodes: Cervical, supraclavicular, and axillary nodes normal. Resp: clear to auscultation bilaterally Back: symmetric, no curvature. ROM normal. No CVA tenderness. GI: soft, non-tender; bowel sounds normal; no masses,  no organomegaly Extremities: extremities normal, atraumatic, no cyanosis or edema Neurologic: Alert and oriented X 3, normal strength and tone. Normal symmetric reflexes. Normal coordination and gait  ECOG PERFORMANCE STATUS:  1 - Symptomatic but completely ambulatory  Blood pressure (!) 141/67, pulse 77, temperature (!) 97.1 F (36.2 C), temperature source Tympanic, resp. rate 16, weight 126 lb 4.8 oz (57.3 kg), SpO2 100 %.  LABORATORY DATA: Lab Results  Component Value Date   WBC 13.0 (H) 03/08/2022   HGB 9.0 (L) 03/08/2022   HCT 31.2 (L) 03/08/2022   MCV 94.8 03/08/2022   PLT 114 (L) 03/08/2022      Chemistry      Component Value Date/Time   NA 140 01/24/2022 0430   K 3.9 01/24/2022 0430   CL 102 01/24/2022 0430   CO2 35 (H) 01/24/2022 0430   BUN 10 01/24/2022 0430   CREATININE 0.63 01/24/2022 0430   CREATININE 0.87 12/09/2021 1050      Component Value Date/Time   CALCIUM 9.7 01/24/2022 0430   ALKPHOS 83 01/13/2022 0507   AST 16 01/13/2022 0507   AST 12 (L) 12/09/2021 1050   ALT 9 01/13/2022 0507   ALT 12 12/09/2021 1050   BILITOT 0.6 01/13/2022 0507   BILITOT 0.4 12/09/2021 1050       RADIOGRAPHIC STUDIES: No results found.  ASSESSMENT AND PLAN: This is a very pleasant 76 years old white female with suspicious metastatic non-small cell lung cancer that was initially diagnosed as a stage IIb (T3, N0, M0) non-small cell lung cancer, squamous cell carcinoma presented with 2 nodules in the right upper lobe status post SBRT followed by progressive mass in the right  middle lobe again status post SBRT in December 2022 and now presenting with suspicious bone metastasis in the left distal femur and knee area.  The patient was also found recently in May 2023 to have suspicious liver lesions I personally and independently reviewed the scan images from the recent CT of the lumbar spine that showed the suspicious liver lesions and showed the images to the patient and her daughter. I recommended for the patient to complete the staging work-up by ordering MRI of the brain as well as whole-body PET scan to include the knees to rule out metastatic disease to the liver as well as the bone in the lower extremities. I will see the patient back for follow-up visit in around 2-3 weeks for discussion of her treatment options based on the staging work-up.  We may consider biopsy one of the liver lesion if it is suspicious on the PET scan for confirmation of the tissue diagnosis as well as molecular studies. The patient and her daughter agreed to the current plan. She was advised to call immediately if she has any other concerning symptoms in the interval. The patient voices understanding of current disease status and treatment options and is in agreement with the current care plan.  All questions were answered. The patient knows to call the clinic with any problems, questions or concerns. We can certainly see the patient much sooner if necessary.  The total time spent in the appointment was 30 minutes.  Disclaimer: This note was dictated with voice recognition software. Similar sounding words can inadvertently be transcribed and may not be corrected upon review.

## 2022-03-09 DIAGNOSIS — L89153 Pressure ulcer of sacral region, stage 3: Secondary | ICD-10-CM | POA: Diagnosis not present

## 2022-03-09 DIAGNOSIS — K5730 Diverticulosis of large intestine without perforation or abscess without bleeding: Secondary | ICD-10-CM | POA: Diagnosis not present

## 2022-03-09 DIAGNOSIS — M86362 Chronic multifocal osteomyelitis, left tibia and fibula: Secondary | ICD-10-CM | POA: Diagnosis not present

## 2022-03-09 DIAGNOSIS — Z433 Encounter for attention to colostomy: Secondary | ICD-10-CM | POA: Diagnosis not present

## 2022-03-09 DIAGNOSIS — C342 Malignant neoplasm of middle lobe, bronchus or lung: Secondary | ICD-10-CM | POA: Diagnosis not present

## 2022-03-09 DIAGNOSIS — M84462D Pathological fracture, left tibia, subsequent encounter for fracture with routine healing: Secondary | ICD-10-CM | POA: Diagnosis not present

## 2022-03-12 ENCOUNTER — Encounter (HOSPITAL_COMMUNITY): Payer: Self-pay

## 2022-03-12 ENCOUNTER — Emergency Department (HOSPITAL_COMMUNITY): Payer: Medicare Other

## 2022-03-12 ENCOUNTER — Other Ambulatory Visit: Payer: Self-pay

## 2022-03-12 ENCOUNTER — Inpatient Hospital Stay (HOSPITAL_COMMUNITY)
Admission: EM | Admit: 2022-03-12 | Discharge: 2022-03-17 | DRG: 064 | Disposition: E | Payer: Medicare Other | Attending: Family Medicine | Admitting: Family Medicine

## 2022-03-12 DIAGNOSIS — R7989 Other specified abnormal findings of blood chemistry: Secondary | ICD-10-CM | POA: Diagnosis present

## 2022-03-12 DIAGNOSIS — R9431 Abnormal electrocardiogram [ECG] [EKG]: Secondary | ICD-10-CM | POA: Diagnosis not present

## 2022-03-12 DIAGNOSIS — E039 Hypothyroidism, unspecified: Secondary | ICD-10-CM | POA: Diagnosis present

## 2022-03-12 DIAGNOSIS — K219 Gastro-esophageal reflux disease without esophagitis: Secondary | ICD-10-CM | POA: Diagnosis present

## 2022-03-12 DIAGNOSIS — Z515 Encounter for palliative care: Secondary | ICD-10-CM | POA: Diagnosis not present

## 2022-03-12 DIAGNOSIS — J449 Chronic obstructive pulmonary disease, unspecified: Secondary | ICD-10-CM | POA: Diagnosis not present

## 2022-03-12 DIAGNOSIS — R471 Dysarthria and anarthria: Secondary | ICD-10-CM | POA: Diagnosis present

## 2022-03-12 DIAGNOSIS — L89153 Pressure ulcer of sacral region, stage 3: Secondary | ICD-10-CM | POA: Diagnosis not present

## 2022-03-12 DIAGNOSIS — Z9981 Dependence on supplemental oxygen: Secondary | ICD-10-CM | POA: Diagnosis not present

## 2022-03-12 DIAGNOSIS — A4902 Methicillin resistant Staphylococcus aureus infection, unspecified site: Secondary | ICD-10-CM | POA: Diagnosis present

## 2022-03-12 DIAGNOSIS — M86252 Subacute osteomyelitis, left femur: Secondary | ICD-10-CM

## 2022-03-12 DIAGNOSIS — I82412 Acute embolism and thrombosis of left femoral vein: Secondary | ICD-10-CM | POA: Diagnosis not present

## 2022-03-12 DIAGNOSIS — J9611 Chronic respiratory failure with hypoxia: Secondary | ICD-10-CM | POA: Diagnosis present

## 2022-03-12 DIAGNOSIS — R29708 NIHSS score 8: Secondary | ICD-10-CM | POA: Diagnosis present

## 2022-03-12 DIAGNOSIS — Z86718 Personal history of other venous thrombosis and embolism: Secondary | ICD-10-CM

## 2022-03-12 DIAGNOSIS — C342 Malignant neoplasm of middle lobe, bronchus or lung: Secondary | ICD-10-CM | POA: Diagnosis not present

## 2022-03-12 DIAGNOSIS — Z7951 Long term (current) use of inhaled steroids: Secondary | ICD-10-CM

## 2022-03-12 DIAGNOSIS — G9389 Other specified disorders of brain: Secondary | ICD-10-CM | POA: Diagnosis not present

## 2022-03-12 DIAGNOSIS — S22000A Wedge compression fracture of unspecified thoracic vertebra, initial encounter for closed fracture: Secondary | ICD-10-CM | POA: Diagnosis present

## 2022-03-12 DIAGNOSIS — I314 Cardiac tamponade: Secondary | ICD-10-CM | POA: Diagnosis not present

## 2022-03-12 DIAGNOSIS — Z933 Colostomy status: Secondary | ICD-10-CM

## 2022-03-12 DIAGNOSIS — I4510 Unspecified right bundle-branch block: Secondary | ICD-10-CM | POA: Diagnosis present

## 2022-03-12 DIAGNOSIS — N390 Urinary tract infection, site not specified: Secondary | ICD-10-CM | POA: Diagnosis not present

## 2022-03-12 DIAGNOSIS — I6350 Cerebral infarction due to unspecified occlusion or stenosis of unspecified cerebral artery: Secondary | ICD-10-CM | POA: Diagnosis not present

## 2022-03-12 DIAGNOSIS — I639 Cerebral infarction, unspecified: Secondary | ICD-10-CM | POA: Diagnosis present

## 2022-03-12 DIAGNOSIS — Z7901 Long term (current) use of anticoagulants: Secondary | ICD-10-CM

## 2022-03-12 DIAGNOSIS — Z801 Family history of malignant neoplasm of trachea, bronchus and lung: Secondary | ICD-10-CM

## 2022-03-12 DIAGNOSIS — I6602 Occlusion and stenosis of left middle cerebral artery: Secondary | ICD-10-CM | POA: Diagnosis not present

## 2022-03-12 DIAGNOSIS — D72829 Elevated white blood cell count, unspecified: Secondary | ICD-10-CM | POA: Diagnosis not present

## 2022-03-12 DIAGNOSIS — G8191 Hemiplegia, unspecified affecting right dominant side: Secondary | ICD-10-CM | POA: Diagnosis present

## 2022-03-12 DIAGNOSIS — R29810 Facial weakness: Secondary | ICD-10-CM | POA: Diagnosis present

## 2022-03-12 DIAGNOSIS — L89150 Pressure ulcer of sacral region, unstageable: Secondary | ICD-10-CM | POA: Diagnosis not present

## 2022-03-12 DIAGNOSIS — R6510 Systemic inflammatory response syndrome (SIRS) of non-infectious origin without acute organ dysfunction: Secondary | ICD-10-CM | POA: Diagnosis not present

## 2022-03-12 DIAGNOSIS — G4733 Obstructive sleep apnea (adult) (pediatric): Secondary | ICD-10-CM | POA: Diagnosis not present

## 2022-03-12 DIAGNOSIS — R4701 Aphasia: Secondary | ICD-10-CM | POA: Diagnosis not present

## 2022-03-12 DIAGNOSIS — D631 Anemia in chronic kidney disease: Secondary | ICD-10-CM | POA: Diagnosis present

## 2022-03-12 DIAGNOSIS — R1310 Dysphagia, unspecified: Secondary | ICD-10-CM | POA: Diagnosis present

## 2022-03-12 DIAGNOSIS — E890 Postprocedural hypothyroidism: Secondary | ICD-10-CM | POA: Diagnosis present

## 2022-03-12 DIAGNOSIS — I959 Hypotension, unspecified: Secondary | ICD-10-CM | POA: Diagnosis present

## 2022-03-12 DIAGNOSIS — C3490 Malignant neoplasm of unspecified part of unspecified bronchus or lung: Secondary | ICD-10-CM

## 2022-03-12 DIAGNOSIS — I952 Hypotension due to drugs: Secondary | ICD-10-CM

## 2022-03-12 DIAGNOSIS — L89159 Pressure ulcer of sacral region, unspecified stage: Secondary | ICD-10-CM | POA: Diagnosis present

## 2022-03-12 DIAGNOSIS — E782 Mixed hyperlipidemia: Secondary | ICD-10-CM | POA: Diagnosis present

## 2022-03-12 DIAGNOSIS — Z9989 Dependence on other enabling machines and devices: Secondary | ICD-10-CM | POA: Diagnosis not present

## 2022-03-12 DIAGNOSIS — I214 Non-ST elevation (NSTEMI) myocardial infarction: Secondary | ICD-10-CM | POA: Diagnosis not present

## 2022-03-12 DIAGNOSIS — C787 Secondary malignant neoplasm of liver and intrahepatic bile duct: Secondary | ICD-10-CM | POA: Diagnosis not present

## 2022-03-12 DIAGNOSIS — Z8249 Family history of ischemic heart disease and other diseases of the circulatory system: Secondary | ICD-10-CM

## 2022-03-12 DIAGNOSIS — F32A Depression, unspecified: Secondary | ICD-10-CM | POA: Diagnosis present

## 2022-03-12 DIAGNOSIS — N189 Chronic kidney disease, unspecified: Secondary | ICD-10-CM | POA: Diagnosis not present

## 2022-03-12 DIAGNOSIS — F419 Anxiety disorder, unspecified: Secondary | ICD-10-CM | POA: Diagnosis present

## 2022-03-12 DIAGNOSIS — D440 Neoplasm of uncertain behavior of thyroid gland: Secondary | ICD-10-CM | POA: Diagnosis present

## 2022-03-12 DIAGNOSIS — I6523 Occlusion and stenosis of bilateral carotid arteries: Secondary | ICD-10-CM | POA: Diagnosis not present

## 2022-03-12 DIAGNOSIS — I63412 Cerebral infarction due to embolism of left middle cerebral artery: Principal | ICD-10-CM | POA: Diagnosis present

## 2022-03-12 DIAGNOSIS — Z66 Do not resuscitate: Secondary | ICD-10-CM | POA: Diagnosis present

## 2022-03-12 DIAGNOSIS — I3139 Other pericardial effusion (noninflammatory): Secondary | ICD-10-CM | POA: Diagnosis present

## 2022-03-12 DIAGNOSIS — I6340 Cerebral infarction due to embolism of unspecified cerebral artery: Secondary | ICD-10-CM | POA: Diagnosis not present

## 2022-03-12 DIAGNOSIS — I82452 Acute embolism and thrombosis of left peroneal vein: Secondary | ICD-10-CM | POA: Diagnosis not present

## 2022-03-12 DIAGNOSIS — I309 Acute pericarditis, unspecified: Secondary | ICD-10-CM | POA: Diagnosis not present

## 2022-03-12 DIAGNOSIS — Z7189 Other specified counseling: Secondary | ICD-10-CM

## 2022-03-12 DIAGNOSIS — R531 Weakness: Secondary | ICD-10-CM | POA: Diagnosis not present

## 2022-03-12 DIAGNOSIS — R778 Other specified abnormalities of plasma proteins: Secondary | ICD-10-CM | POA: Diagnosis not present

## 2022-03-12 DIAGNOSIS — Z87891 Personal history of nicotine dependence: Secondary | ICD-10-CM

## 2022-03-12 DIAGNOSIS — I6389 Other cerebral infarction: Secondary | ICD-10-CM | POA: Diagnosis not present

## 2022-03-12 DIAGNOSIS — Z7952 Long term (current) use of systemic steroids: Secondary | ICD-10-CM

## 2022-03-12 DIAGNOSIS — Z79890 Hormone replacement therapy (postmenopausal): Secondary | ICD-10-CM

## 2022-03-12 DIAGNOSIS — C7951 Secondary malignant neoplasm of bone: Secondary | ICD-10-CM | POA: Diagnosis present

## 2022-03-12 DIAGNOSIS — I82409 Acute embolism and thrombosis of unspecified deep veins of unspecified lower extremity: Secondary | ICD-10-CM

## 2022-03-12 DIAGNOSIS — Z9049 Acquired absence of other specified parts of digestive tract: Secondary | ICD-10-CM

## 2022-03-12 DIAGNOSIS — M869 Osteomyelitis, unspecified: Secondary | ICD-10-CM | POA: Diagnosis present

## 2022-03-12 LAB — DIFFERENTIAL
Abs Immature Granulocytes: 0.1 10*3/uL — ABNORMAL HIGH (ref 0.00–0.07)
Basophils Absolute: 0 10*3/uL (ref 0.0–0.1)
Basophils Relative: 0 %
Eosinophils Absolute: 0.7 10*3/uL — ABNORMAL HIGH (ref 0.0–0.5)
Eosinophils Relative: 6 %
Immature Granulocytes: 1 %
Lymphocytes Relative: 14 %
Lymphs Abs: 1.7 10*3/uL (ref 0.7–4.0)
Monocytes Absolute: 0.5 10*3/uL (ref 0.1–1.0)
Monocytes Relative: 4 %
Neutro Abs: 9.1 10*3/uL — ABNORMAL HIGH (ref 1.7–7.7)
Neutrophils Relative %: 75 %

## 2022-03-12 LAB — LIPID PANEL
Cholesterol: 137 mg/dL (ref 0–200)
HDL: 43 mg/dL (ref >40)
LDL Cholesterol: 61 mg/dL (ref 0–99)
Total CHOL/HDL Ratio: 3.2 RATIO
Triglycerides: 166 mg/dL — ABNORMAL HIGH (ref <150)
VLDL: 33 mg/dL (ref 0–40)

## 2022-03-12 LAB — COMPREHENSIVE METABOLIC PANEL WITH GFR
ALT: 21 U/L (ref 0–44)
AST: 36 U/L (ref 15–41)
Albumin: 2.5 g/dL — ABNORMAL LOW (ref 3.5–5.0)
Alkaline Phosphatase: 179 U/L — ABNORMAL HIGH (ref 38–126)
Anion gap: 8 (ref 5–15)
BUN: 17 mg/dL (ref 8–23)
CO2: 32 mmol/L (ref 22–32)
Calcium: 9.8 mg/dL (ref 8.9–10.3)
Chloride: 97 mmol/L — ABNORMAL LOW (ref 98–111)
Creatinine, Ser: 0.86 mg/dL (ref 0.44–1.00)
GFR, Estimated: >60 mL/min
Glucose, Bld: 165 mg/dL — ABNORMAL HIGH (ref 70–99)
Potassium: 4 mmol/L (ref 3.5–5.1)
Sodium: 137 mmol/L (ref 135–145)
Total Bilirubin: 0.6 mg/dL (ref 0.3–1.2)
Total Protein: 5.9 g/dL — ABNORMAL LOW (ref 6.5–8.1)

## 2022-03-12 LAB — I-STAT CHEM 8, ED
BUN: 19 mg/dL (ref 8–23)
Calcium, Ion: 1.27 mmol/L (ref 1.15–1.40)
Chloride: 96 mmol/L — ABNORMAL LOW (ref 98–111)
Creatinine, Ser: 0.9 mg/dL (ref 0.44–1.00)
Glucose, Bld: 161 mg/dL — ABNORMAL HIGH (ref 70–99)
HCT: 29 % — ABNORMAL LOW (ref 36.0–46.0)
Hemoglobin: 9.9 g/dL — ABNORMAL LOW (ref 12.0–15.0)
Potassium: 3.9 mmol/L (ref 3.5–5.1)
Sodium: 137 mmol/L (ref 135–145)
TCO2: 35 mmol/L — ABNORMAL HIGH (ref 22–32)

## 2022-03-12 LAB — HEMOGLOBIN A1C
Hgb A1c MFr Bld: 5.1 % (ref 4.8–5.6)
Mean Plasma Glucose: 99.67 mg/dL

## 2022-03-12 LAB — PROTIME-INR
INR: 1.5 — ABNORMAL HIGH (ref 0.8–1.2)
Prothrombin Time: 18 seconds — ABNORMAL HIGH (ref 11.4–15.2)

## 2022-03-12 LAB — CBC
HCT: 29.7 % — ABNORMAL LOW (ref 36.0–46.0)
Hemoglobin: 8.7 g/dL — ABNORMAL LOW (ref 12.0–15.0)
MCH: 27.9 pg (ref 26.0–34.0)
MCHC: 29.3 g/dL — ABNORMAL LOW (ref 30.0–36.0)
MCV: 95.2 fL (ref 80.0–100.0)
Platelets: 120 10*3/uL — ABNORMAL LOW (ref 150–400)
RBC: 3.12 MIL/uL — ABNORMAL LOW (ref 3.87–5.11)
RDW: 17.6 % — ABNORMAL HIGH (ref 11.5–15.5)
WBC: 12 10*3/uL — ABNORMAL HIGH (ref 4.0–10.5)
nRBC: 0 % (ref 0.0–0.2)

## 2022-03-12 LAB — BRAIN NATRIURETIC PEPTIDE: B Natriuretic Peptide: 186.8 pg/mL — ABNORMAL HIGH (ref 0.0–100.0)

## 2022-03-12 LAB — TROPONIN I (HIGH SENSITIVITY)
Troponin I (High Sensitivity): 2671 ng/L (ref <18)
Troponin I (High Sensitivity): 1865 ng/L (ref <18)

## 2022-03-12 LAB — CBG MONITORING, ED: Glucose-Capillary: 153 mg/dL — ABNORMAL HIGH (ref 70–99)

## 2022-03-12 LAB — TSH: TSH: 1.756 u[IU]/mL (ref 0.350–4.500)

## 2022-03-12 LAB — LACTIC ACID, PLASMA: Lactic Acid, Venous: 1.4 mmol/L (ref 0.5–1.9)

## 2022-03-12 LAB — APTT: aPTT: 37 seconds — ABNORMAL HIGH (ref 24–36)

## 2022-03-12 IMAGING — CT CT HEAD CODE STROKE
4 series · 15 of 47 positions shown, 17 images · non-contrast
Comparison: CT head [DATE].

CLINICAL DATA: Code stroke.  Neuro deficit, acute, stroke suspected



[Series 3: head wo · axial · 0.35mm/px · z∈[-55,+45]mm · 6 of 30 slices shown, 8 images]
[im 5/30  brain]
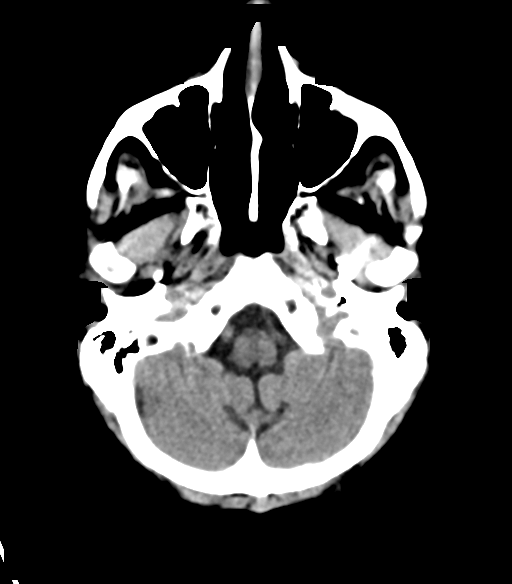
[im 5/30  bone]
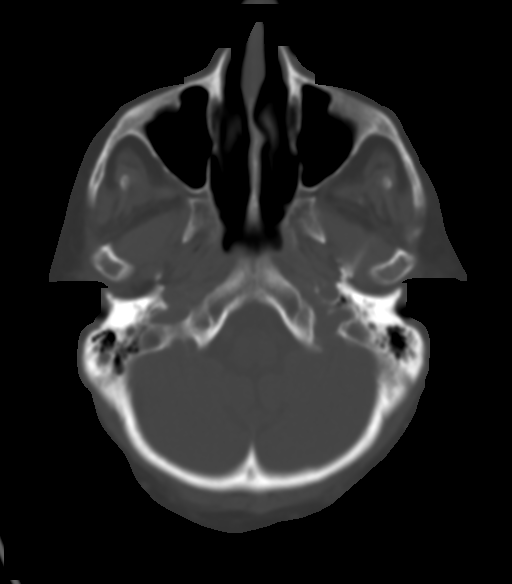
[im 9/30  brain]
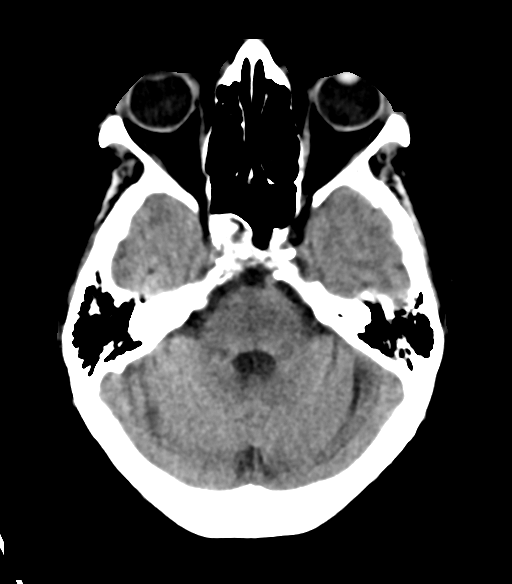
[im 13/30  brain]
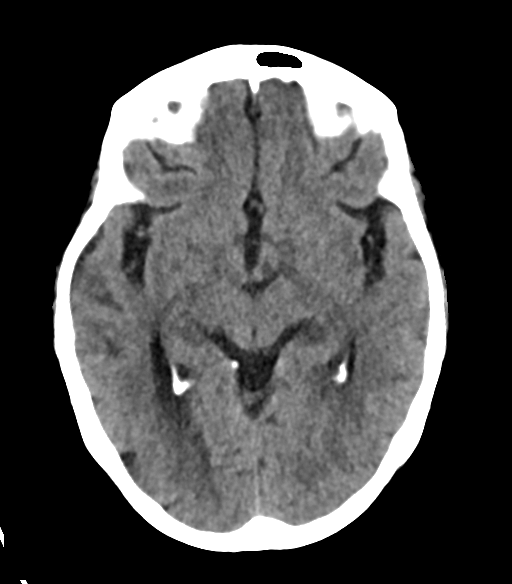
[im 17/30  brain]
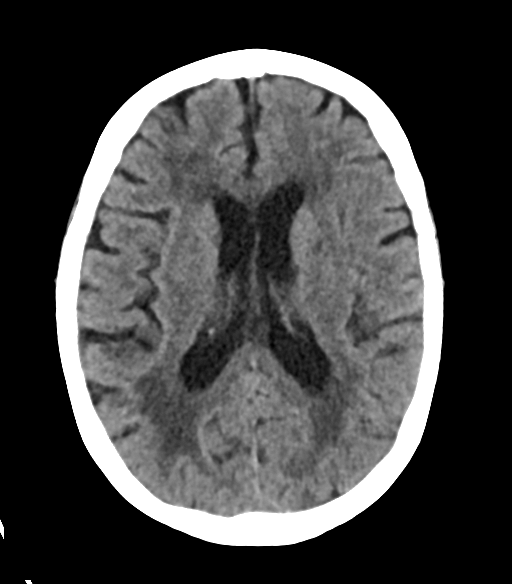
[im 21/30  brain]
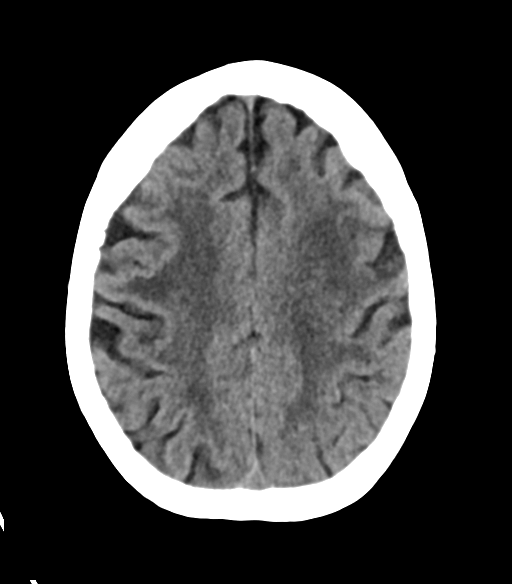
[im 21/30  bone]
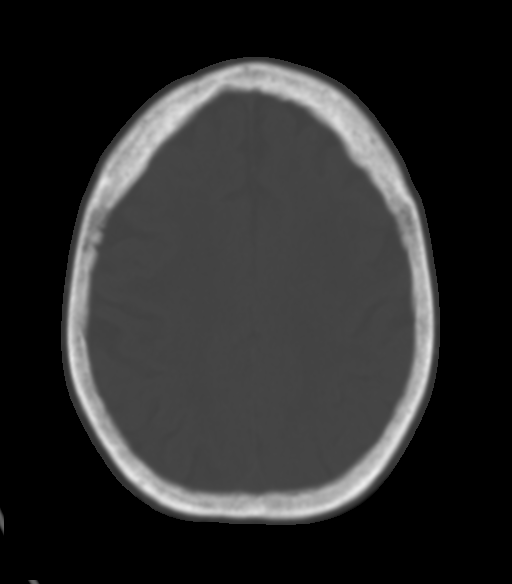
[im 25/30  brain]
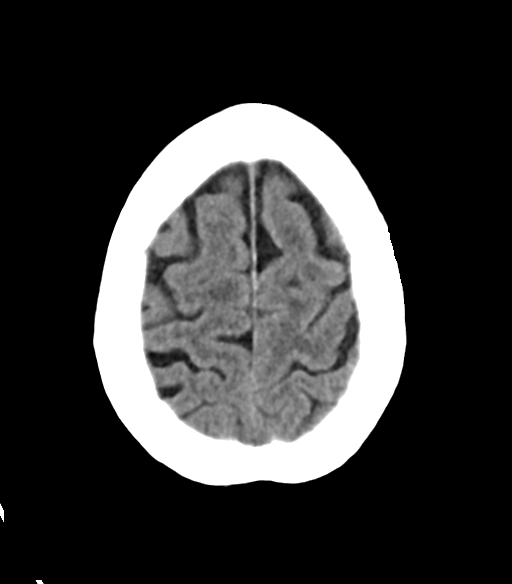

[Series 4: head bone · axial · 0.41mm/px · z∈[-68,-30]mm · 3 of 79 slices shown]
[im 8/79  bone]
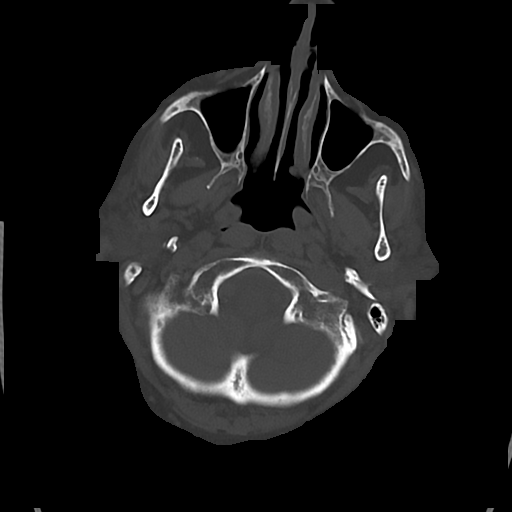
[im 15/79  bone]
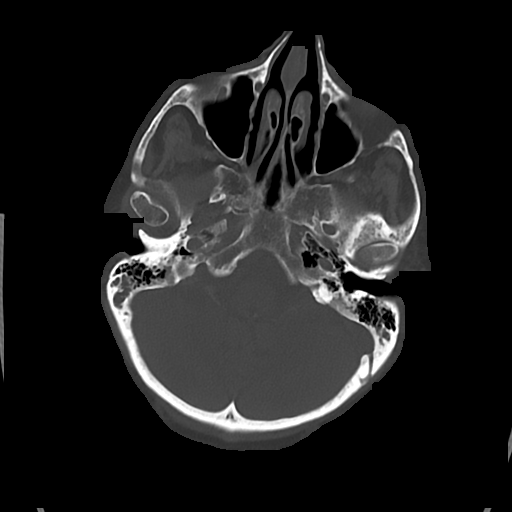
[im 27/79  bone]
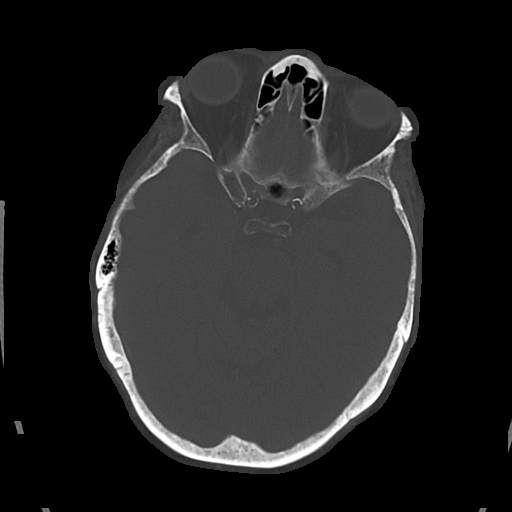

[Series 5: cor soft · coronal · 0.29mm/px · 3 of 68 slices shown]
[im 23/68  brain]
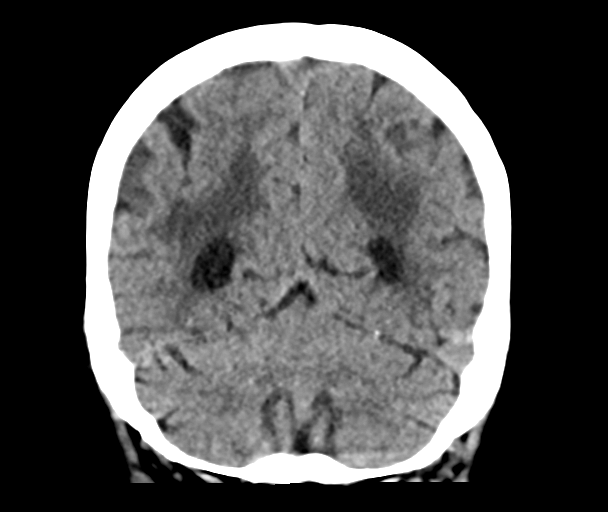
[im 30/68  brain]
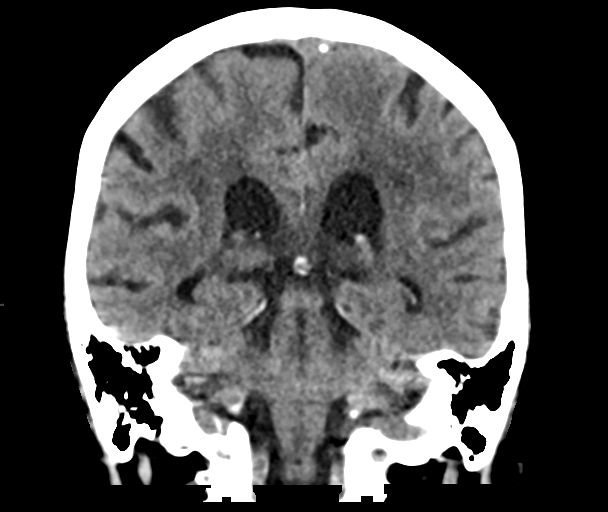
[im 38/68  brain]
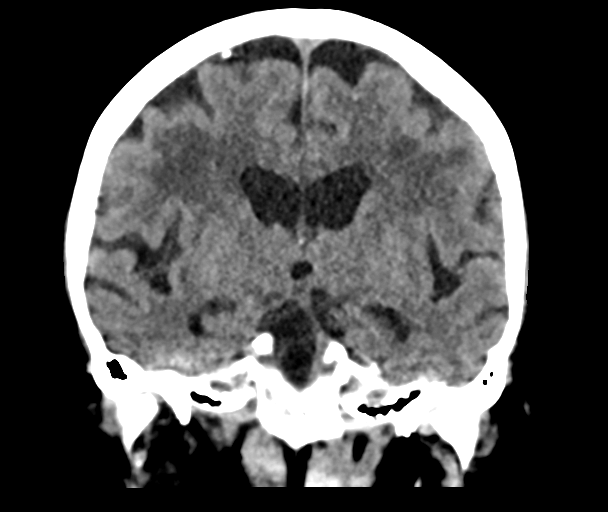

[Series 6: sag soft · sagittal · 0.29mm/px · 3 of 55 slices shown]
[im 19/55  brain]
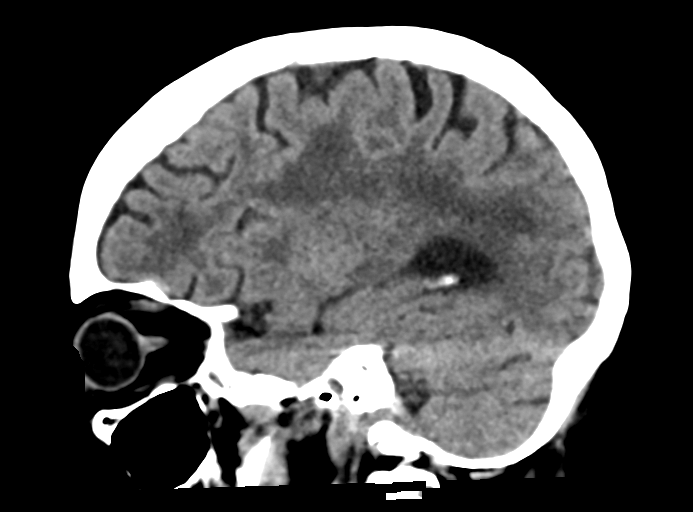
[im 28/55  brain]
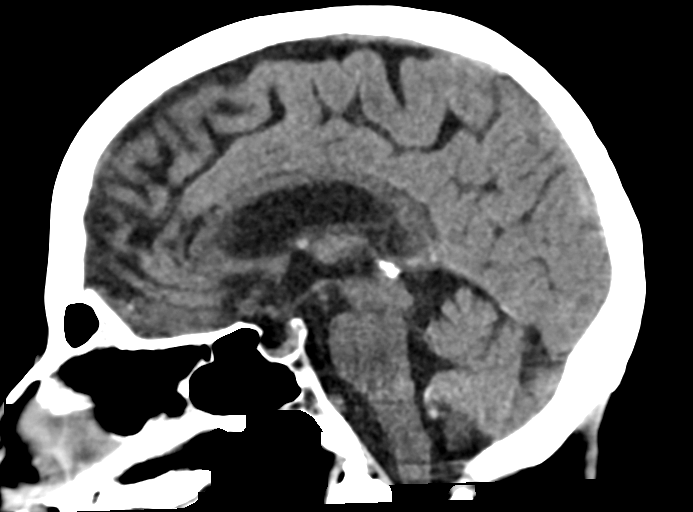
[im 37/55  brain]
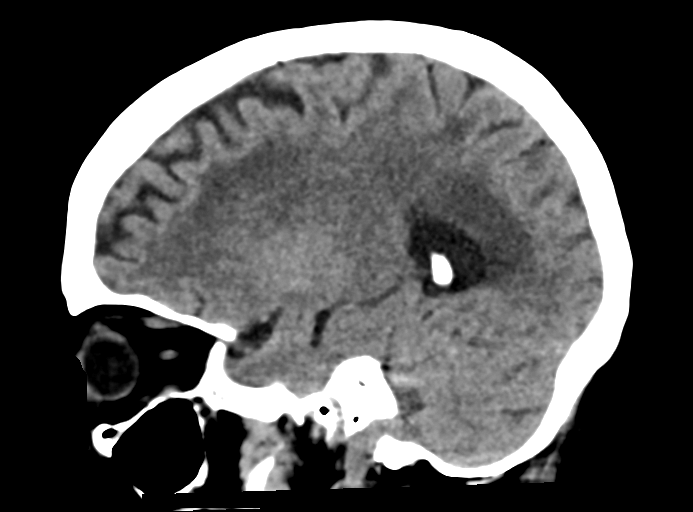

[15 of 47 positions shown; findings below may reference images not displayed]

FINDINGS: Brain: No evidence of acute infarction, hemorrhage, hydrocephalus,
or extra-axial collection. Redemonstrated 8 mm rounded dural-based
calcified focus overlying the right frontal lobe. Similar severe
patchy white matter hypodensities in the white matter, nonspecific
but compatible with chronic microvascular ischemic disease.

Vascular: Calcific intracranial spaces. No evidence vessel
identified.

Skull: No acute fracture.

Sinuses/Orbits: Clear sinuses.  No acute orbital findings.

Other: No mastoid effusions.

ASPECTS ([place] Stroke Program Early CT Score) total score (0-10
with 10 being normal): 10.
IMPRESSION: 1. No evidence of acute intracranial abnormality.  ASPECTS is 10.
2. Severe chronic microvascular ischemic disease.
3. Redemonstrated 8 mm rounded dural-based calcified focus overlying
the right frontal lobe, suspicious for an incidental meningioma.

am to provider MOOLMAN via secure text paging.

## 2022-03-12 IMAGING — DX DG CHEST 1V PORT
1 series · 1 of 1 positions shown · non-contrast
Comparison: [DATE].  Chest CT dated [DATE].

CLINICAL DATA: Weakness. Stroke. Non-small-cell lung cancer treated
with chemotherapy and radiation therapy.

EXAM:
PORTABLE CHEST 1 VIEW

[chest ap]
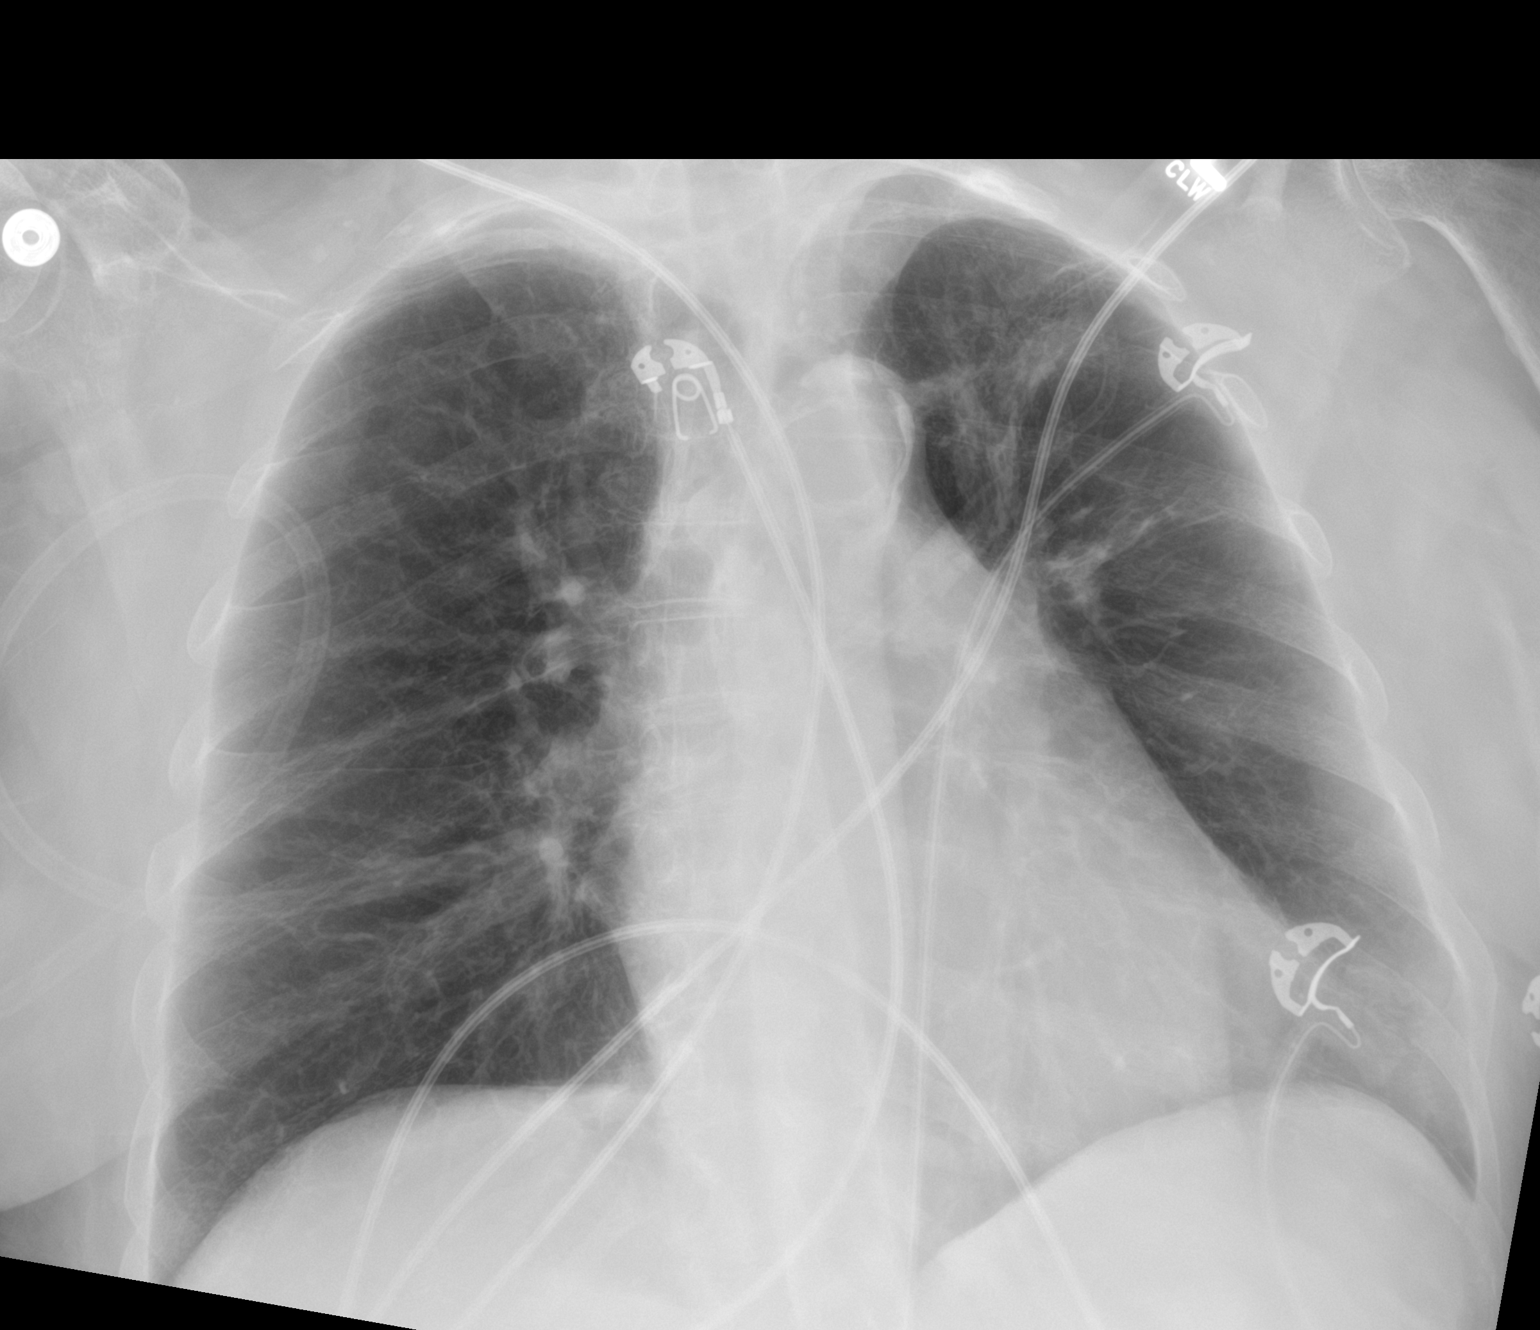

[1 of 1 positions shown; findings below may reference images not displayed]

FINDINGS: Mildly enlarged cardiac silhouette, accentuated above prominent
pericardial and epicardial fat on the previous CT. Mild patchy and
linear opacity in the left upper lobe, similar to the previous CT.
The previously demonstrated right middle lobe mass is currently
visualized. No pleural fluid. Mildly tortuous and calcified thoracic
aorta. Mild scoliosis.
IMPRESSION: No acute abnormality. Grossly stable areas of consolidation in the
left upper lobe compared to the previous CT dated [DATE] and
described in more detail in that report.

## 2022-03-12 MED ORDER — HEPARIN (PORCINE) 25000 UT/250ML-% IV SOLN
700.0000 [IU]/h | INTRAVENOUS | Status: DC
  Administered 2022-03-12: 700 [IU]/h via INTRAVENOUS
  Filled 2022-03-12: qty 250

## 2022-03-12 MED ORDER — PREDNISONE 20 MG PO TABS
20.0000 mg | ORAL_TABLET | Freq: Every day | ORAL | Status: DC
  Administered 2022-03-14: 20 mg via ORAL
  Filled 2022-03-12 (×2): qty 1

## 2022-03-12 MED ORDER — ALBUTEROL SULFATE (2.5 MG/3ML) 0.083% IN NEBU
2.5000 mg | INHALATION_SOLUTION | Freq: Three times a day (TID) | RESPIRATORY_TRACT | Status: DC | PRN
  Administered 2022-03-13: 2.5 mg via RESPIRATORY_TRACT
  Filled 2022-03-12 (×2): qty 3

## 2022-03-12 MED ORDER — ALPRAZOLAM 0.25 MG PO TABS
0.5000 mg | ORAL_TABLET | Freq: Two times a day (BID) | ORAL | Status: DC | PRN
  Administered 2022-03-12: 0.5 mg via ORAL
  Filled 2022-03-12: qty 2

## 2022-03-12 MED ORDER — LEVOTHYROXINE SODIUM 25 MCG PO TABS: 137.0000 ug | ORAL_TABLET | Freq: Every day | ORAL | Status: DC

## 2022-03-12 MED ORDER — CEPHALEXIN 250 MG PO CAPS
500.0000 mg | ORAL_CAPSULE | Freq: Three times a day (TID) | ORAL | Status: DC
  Administered 2022-03-12 – 2022-03-13 (×2): 500 mg via ORAL
  Filled 2022-03-12 (×4): qty 2

## 2022-03-12 MED ORDER — IOHEXOL 350 MG/ML SOLN
100.0000 mL | Freq: Once | INTRAVENOUS | Status: AC | PRN
  Administered 2022-03-12: 100 mL via INTRAVENOUS

## 2022-03-12 MED ORDER — BUPROPION HCL ER (XL) 300 MG PO TB24
300.0000 mg | ORAL_TABLET | Freq: Every morning | ORAL | Status: DC
Start: 2022-03-13 — End: 2022-03-14
  Filled 2022-03-12 (×2): qty 1

## 2022-03-12 MED ORDER — ACETAMINOPHEN 650 MG RE SUPP: 650.0000 mg | RECTAL | Status: DC | PRN

## 2022-03-12 MED ORDER — MIRTAZAPINE 30 MG PO TABS
30.0000 mg | ORAL_TABLET | Freq: Every day | ORAL | Status: DC
  Administered 2022-03-12: 30 mg via ORAL
  Filled 2022-03-12 (×2): qty 1

## 2022-03-12 MED ORDER — OXYCODONE HCL 5 MG PO TABS
5.0000 mg | ORAL_TABLET | ORAL | Status: DC | PRN
  Filled 2022-03-12: qty 1

## 2022-03-12 MED ORDER — DONEPEZIL HCL 10 MG PO TABS
10.0000 mg | ORAL_TABLET | Freq: Every day | ORAL | Status: DC
  Administered 2022-03-12: 10 mg via ORAL
  Filled 2022-03-12: qty 1

## 2022-03-12 MED ORDER — STROKE: EARLY STAGES OF RECOVERY BOOK
Freq: Once | Status: AC
  Filled 2022-03-12: qty 1

## 2022-03-12 MED ORDER — ACETAMINOPHEN 325 MG PO TABS: 650.0000 mg | ORAL_TABLET | ORAL | Status: DC | PRN

## 2022-03-12 MED ORDER — BUSPIRONE HCL 10 MG PO TABS
5.0000 mg | ORAL_TABLET | Freq: Two times a day (BID) | ORAL | Status: DC
Start: 2022-03-12 — End: 2022-03-14
  Administered 2022-03-12: 5 mg via ORAL
  Filled 2022-03-12 (×3): qty 1

## 2022-03-12 MED ORDER — DOXYCYCLINE HYCLATE 100 MG PO TABS
100.0000 mg | ORAL_TABLET | Freq: Two times a day (BID) | ORAL | Status: DC
  Administered 2022-03-12: 100 mg via ORAL
  Filled 2022-03-12 (×2): qty 1

## 2022-03-12 MED ORDER — COLLAGENASE 250 UNIT/GM EX OINT
TOPICAL_OINTMENT | Freq: Every day | CUTANEOUS | Status: DC
  Filled 2022-03-12: qty 30

## 2022-03-12 MED ORDER — SODIUM CHLORIDE 0.9% FLUSH
3.0000 mL | Freq: Once | INTRAVENOUS | Status: AC
  Administered 2022-03-12: 3 mL via INTRAVENOUS

## 2022-03-12 MED ORDER — METHYLPREDNISOLONE SODIUM SUCC 125 MG IJ SOLR
125.0000 mg | Freq: Once | INTRAMUSCULAR | Status: AC
Start: 2022-03-12 — End: 2022-03-12
  Administered 2022-03-12: 125 mg via INTRAVENOUS
  Filled 2022-03-12: qty 2

## 2022-03-12 MED ORDER — ACETAMINOPHEN 160 MG/5ML PO SOLN: 650.0000 mg | ORAL | Status: DC | PRN

## 2022-03-12 MED ORDER — LACTATED RINGERS IV BOLUS
500.0000 mL | Freq: Once | INTRAVENOUS | Status: AC
  Administered 2022-03-12: 500 mL via INTRAVENOUS

## 2022-03-12 MED ORDER — FLUTICASONE FUROATE-VILANTEROL 100-25 MCG/ACT IN AEPB
1.0000 | INHALATION_SPRAY | Freq: Every day | RESPIRATORY_TRACT | Status: DC
  Filled 2022-03-12: qty 28

## 2022-03-12 MED ORDER — LACTATED RINGERS IV SOLN: INTRAVENOUS | Status: DC

## 2022-03-12 MED ORDER — FLUTICASONE-UMECLIDIN-VILANT 100-62.5-25 MCG/INH IN AEPB: 1.0000 | INHALATION_SPRAY | Freq: Every day | RESPIRATORY_TRACT | Status: DC

## 2022-03-12 MED ORDER — ATORVASTATIN CALCIUM 10 MG PO TABS
20.0000 mg | ORAL_TABLET | Freq: Every day | ORAL | Status: DC
  Administered 2022-03-12: 20 mg via ORAL
  Filled 2022-03-12: qty 2

## 2022-03-12 MED ORDER — UMECLIDINIUM BROMIDE 62.5 MCG/ACT IN AEPB
1.0000 | INHALATION_SPRAY | Freq: Every day | RESPIRATORY_TRACT | Status: DC
  Filled 2022-03-12: qty 7

## 2022-03-12 MED ORDER — PANTOPRAZOLE SODIUM 40 MG PO TBEC
40.0000 mg | DELAYED_RELEASE_TABLET | Freq: Two times a day (BID) | ORAL | Status: DC
  Administered 2022-03-12: 40 mg via ORAL
  Filled 2022-03-12 (×3): qty 1

## 2022-03-12 NOTE — ED Triage Notes (Signed)
Pt BIB [facility] as a code stroke. Pt started feeling weak on Monday, family advised PCP who told the pt the symptoms might be fentanyl patch related, instructed  the pt to remove the patch to see if the symptoms will resolve. Pt's symptoms have worsened last night, and the pt woke up this morning w R weakness and R facial  droop and aphasia.

## 2022-03-12 NOTE — ED Provider Notes (Addendum)
[facility] EMERGENCY DEPARTMENT Provider Note   CSN: [id number] Arrival date & time: 02/14/2022  1126  An emergency department physician performed an initial assessment on this suspected stroke patient at 1126.  History  Chief Complaint  Patient presents with   Code Stroke    Holly [name] is a 76 y.o. female.  HPI  76 year old female presents to the emergency department with concern for aphasia and difficulty swallowing.  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  Patient seen as a code stroke, evaluated at EMS triage with stroke neurology at bedside.  Patient evaluated in the CT scanner as well.  Has some right-sided weakness and is completely aphasic but following commands, eyes crossing midline.  Level 5 caveat secondary to acuity/aphasia.  Home Medications Prior to Admission medications   Medication Sig Start Date End Date Taking? Authorizing Provider  acetaminophen (TYLENOL) 325 MG tablet Take 2 tablets (650 mg total) by mouth 4 (four) times daily -  before meals and at bedtime. 01/27/22  Yes Setzer, Edman Circle, PA-C  albuterol (PROVENTIL) (2.5 MG/3ML) 0.083% nebulizer solution Take 2.5 mg by nebulization 3 (three) times daily as needed for wheezing or shortness of breath.   Yes [provider]  ALPRAZolam (XANAX) 0.5 MG tablet Take 1 tablet (0.5 mg total) by mouth 2 (two) times daily. 01/27/22  Yes Setzer, Edman Circle, PA-C  apixaban (ELIQUIS) 5 MG TABS tablet Take 1 tablet (5 mg total) by mouth 2 (two) times daily. 01/31/22 04/13/22 Yes Setzer, Edman Circle, PA-C  atorvastatin (LIPITOR) 20 MG tablet Take 20 mg by mouth at bedtime.    Yes [provider]  buPROPion (WELLBUTRIN XL) 300 MG 24 hr tablet Take 300 mg by mouth every morning. 07/08/21  Yes [provider]  busPIRone (BUSPAR) 5 MG tablet Take 1 tablet (5 mg  total) by mouth 2 (two) times daily. 01/27/22  Yes Setzer, Edman Circle, PA-C  cephALEXin (KEFLEX) 500 MG capsule Take 500 mg by mouth 3 (three) times daily. 03/05/22  Yes [provider]  donepezil (ARICEPT) 10 MG tablet Take 1 tablet (10 mg total) by mouth at bedtime. 01/27/22  Yes Setzer, Edman Circle, PA-C  doxycycline (VIBRA-TABS) 100 MG tablet Take 1 tablet (100 mg total) by mouth 2 (two) times daily. To start AFTER finishing the daptomycin Patient taking differently: Take 100 mg by mouth 2 (two) times daily. To start AFTER finishing the daptomycin  ( started on 02-08-22)  30 DS 02/08/22  Yes Tommy Medal, Lavell Islam, MD  Fluticasone-Umeclidin-Vilant 100-62.5-25 MCG/INH AEPB Inhale 1 puff into the lungs daily. Trelegy   Yes [provider]  furosemide (LASIX) 20 MG tablet Take 20 mg by mouth 3 (three) times a week. 02/17/22  Yes [provider]  levothyroxine (SYNTHROID) 137 MCG tablet Take 1 tablet (137 mcg total) by mouth daily before breakfast. 01/27/22  Yes Setzer, Edman Circle, PA-C  losartan (COZAAR) 50 MG tablet Take 25 mg by mouth daily. 02/28/22  Yes [provider]  metoprolol succinate (TOPROL-XL) 25 MG 24 hr tablet Take 1 tablet (25 mg total) by mouth daily. Take with or immediately following a meal. 01/27/22  Yes Setzer, Edman Circle, PA-C  mirtazapine (REMERON) 30 MG tablet Take 1 tablet (30 mg total) by mouth at bedtime. 01/27/22  Yes Setzer, Edman Circle, PA-C  Multiple Vitamin (MULTIVITAMIN WITH MINERALS) TABS tablet Take 1  tablet by mouth daily. 01/28/22  Yes Setzer, Edman Circle, PA-C  Nystatin (GERHARDT'S BUTT CREAM) CREA Apply 1 application. topically 2 (two) times daily. Patient taking differently: Apply 1 application. topically 2 (two) times daily as needed for irritation (skin breakdown). 01/27/22  Yes Setzer, Edman Circle, PA-C  oxyCODONE (OXY IR/ROXICODONE) 5 MG immediate release tablet Take 1 tablet (5 mg total) by mouth every 4 (four) hours as needed for severe pain. 01/27/22   Yes Setzer, Edman Circle, PA-C  pantoprazole (PROTONIX) 40 MG tablet Take 1 tablet (40 mg total) by mouth 2 (two) times daily. After 03/09/2022, take only one tablet each day. 01/27/22 02/15/2022 Yes Setzer, Edman Circle, PA-C  predniSONE (DELTASONE) 20 MG tablet Take 20 mg by mouth daily. continuous 07/08/21  Yes [provider]  SANTYL 250 UNIT/GM ointment Apply topically at bedtime. 02/17/22  Yes [provider]  VENTOLIN HFA 108 (90 Base) MCG/ACT inhaler INHALE 2 PUFFS INTO THE LUNGS EVERY 4 HOURS AS NEEDED FOR WHEEZING ORSHORTNESS OF BREATH Patient taking differently: Inhale 2 puffs into the lungs every 4 (four) hours as needed for wheezing or shortness of breath. 06/29/17  Yes Parrett, Tammy S, NP  DAPTOmycin 500 mg in sodium chloride 0.9 % 50 mL Inject 500 mg into the vein daily at 8 pm. Patient not taking: Reported on 02/16/2022 01/27/22   Barbie Banner, PA-C  fentaNYL (DURAGESIC) 12 MCG/HR Place 1 patch onto the skin every 3 (three) days. 03/05/22   [provider]  leptospermum manuka honey (MEDIHONEY) PSTE paste Apply 1 application. topically daily. Patient not taking: Reported on 02/19/2022 01/28/22   Barbie Banner, PA-C  naloxone [facility]) nasal spray 4 mg/0.1 mL 1 spray daily. 01/27/22   [provider]      Allergies    Amlodipine besylate, Codeine, and Oxycodone-acetaminophen    Review of Systems   Review of Systems  Unable to perform ROS: Patient nonverbal   Physical Exam Updated Vital Signs BP 100/65   Pulse 87   Temp 98.1 F (36.7 C) (Oral)   Resp 17   Wt 55.6 kg   SpO2 100%   BMI 23.94 kg/m  Physical Exam Vitals and nursing note reviewed.  Constitutional:      Appearance: Normal appearance. She is ill-appearing.  HENT:     Head: Normocephalic.     Mouth/Throat:     Mouth: Mucous membranes are moist.  Eyes:     Pupils: Pupils are equal, round, and reactive to light.  Cardiovascular:     Rate and Rhythm: Normal rate.  Pulmonary:      Effort: Pulmonary effort is normal. No respiratory distress.  Abdominal:     Palpations: Abdomen is soft.     Tenderness: There is no abdominal tenderness.  Skin:    General: Skin is warm.  Neurological:     Mental Status: She is alert. She is disoriented.     Motor: Weakness present.     Comments: aphasic  Psychiatric:        Mood and Affect: Mood normal.    ED Results / Procedures / Treatments   Labs (all labs ordered are listed, but only abnormal results are displayed) Labs Reviewed  PROTIME-INR - Abnormal; Notable for the following components:      Result Value   Prothrombin Time 18.0 (*)    INR 1.5 (*)    All other components within normal limits  APTT - Abnormal; Notable for the following components:   aPTT 37 (*)  All other components within normal limits  CBC - Abnormal; Notable for the following components:   WBC 12.0 (*)    RBC 3.12 (*)    Hemoglobin 8.7 (*)    HCT 29.7 (*)    MCHC 29.3 (*)    RDW 17.6 (*)    Platelets 120 (*)    All other components within normal limits  DIFFERENTIAL - Abnormal; Notable for the following components:   Neutro Abs 9.1 (*)    Eosinophils Absolute 0.7 (*)    Abs Immature Granulocytes 0.10 (*)    All other components within normal limits  COMPREHENSIVE METABOLIC PANEL - Abnormal; Notable for the following components:   Chloride 97 (*)    Glucose, Bld 165 (*)    Total Protein 5.9 (*)    Albumin 2.5 (*)    Alkaline Phosphatase 179 (*)    All other components within normal limits  BRAIN NATRIURETIC PEPTIDE - Abnormal; Notable for the following components:   B Natriuretic Peptide 186.8 (*)    All other components within normal limits  I-STAT CHEM 8, ED - Abnormal; Notable for the following components:   Chloride 96 (*)    Glucose, Bld 161 (*)    TCO2 35 (*)    Hemoglobin 9.9 (*)    HCT 29.0 (*)    All other components within normal limits  CBG MONITORING, ED - Abnormal; Notable for the following components:    Glucose-Capillary 153 (*)    All other components within normal limits  TROPONIN I (HIGH SENSITIVITY) - Abnormal; Notable for the following components:   Troponin I (High Sensitivity) 2,671 (*)    All other components within normal limits  TROPONIN I (HIGH SENSITIVITY)    EKG EKG Interpretation  Date/Time:  Saturday Mar 12 2022 12:24:17 EDT Ventricular Rate:  91 PR Interval:  134 QRS Duration: 118 QT Interval:  369 QTC Calculation: 454 R Axis:   -53 Text Interpretation: Sinus rhythm Incomplete right bundle branch block Inferior infarct, acute Minimal ST elevation, anterior leads Lateral leads are also involved >>> Acute MI <<< STE with Q waves inferiorly, RBBB is new Confirmed by Lavenia Atlas [phone]) on 03/10/2022 1:06:33 PM  Radiology DG Chest Port 1 View  Result Date: 02/22/2022 CLINICAL DATA:  Weakness. Stroke. Non-small-cell lung cancer treated with chemotherapy and radiation therapy. EXAM: PORTABLE CHEST 1 VIEW COMPARISON:  08/28/2021.  Chest CT dated 11/30/2021. FINDINGS: Mildly enlarged cardiac silhouette, accentuated above prominent pericardial and epicardial fat on the previous CT. Mild patchy and linear opacity in the left upper lobe, similar to the previous CT. The previously demonstrated right middle lobe mass is currently visualized. No pleural fluid. Mildly tortuous and calcified thoracic aorta. Mild scoliosis. IMPRESSION: No acute abnormality. Grossly stable areas of consolidation in the left upper lobe compared to the previous CT dated 11/30/2021 and described in more detail in that report. Electronically Signed   By: Claudie Revering M.D.   On: 03/04/2022 13:31   CT HEAD CODE STROKE WO CONTRAST  Result Date: 03/06/2022 CLINICAL DATA:  Code stroke.  Neuro deficit, acute, stroke suspected EXAM: CT HEAD WITHOUT CONTRAST TECHNIQUE: Contiguous axial images were obtained from the base of the skull through the vertex without intravenous contrast. RADIATION DOSE REDUCTION: This exam  was performed according to the departmental dose-optimization program which includes automated exposure control, adjustment of the mA and/or kV according to patient size and/or use of iterative reconstruction technique. COMPARISON:  CT head July 24, 2021. FINDINGS: Brain: No evidence  of acute infarction, hemorrhage, hydrocephalus, or extra-axial collection. Redemonstrated 8 mm rounded dural-based calcified focus overlying the right frontal lobe. Similar severe patchy white matter hypodensities in the white matter, nonspecific but compatible with chronic microvascular ischemic disease. Vascular: Calcific intracranial spaces. No evidence vessel identified. Skull: No acute fracture. Sinuses/Orbits: Clear sinuses.  No acute orbital findings. Other: No mastoid effusions. ASPECTS [facility] Stroke Program Early CT Score) total score (0-10 with 10 being normal): 10. IMPRESSION: 1. No evidence of acute intracranial abnormality.  ASPECTS is 10. 2. Severe chronic microvascular ischemic disease. 3. Redemonstrated 8 mm rounded dural-based calcified focus overlying the right frontal lobe, suspicious for an incidental meningioma. Code stroke imaging results were communicated on 03/05/2022 at 11:39 am to provider [facility] via secure text paging. Electronically Signed   By: Margaretha Sheffield M.D.   On: 02/19/2022 11:42   CT ANGIO HEAD NECK W WO CM W PERF (CODE STROKE)  Result Date: 03/13/2022 CLINICAL DATA:  Neuro deficit, acute, stroke suspected Right weakness and aphasia symptom onset Monday EXAM: CT ANGIOGRAPHY HEAD AND NECK CT PERFUSION BRAIN TECHNIQUE: Multidetector CT imaging of the head and neck was performed using the standard protocol during bolus administration of intravenous contrast. Multiplanar CT image reconstructions and MIPs were obtained to evaluate the vascular anatomy. Carotid stenosis measurements (when applicable) are obtained utilizing NASCET criteria, using the distal internal carotid diameter as the  denominator. Multiphase CT imaging of the brain was performed following IV bolus contrast injection. Subsequent parametric perfusion maps were calculated using RAPID software. RADIATION DOSE REDUCTION: This exam was performed according to the departmental dose-optimization program which includes automated exposure control, adjustment of the mA and/or kV according to patient size and/or use of iterative reconstruction technique. CONTRAST:  115mL OMNIPAQUE IOHEXOL 350 MG/ML SOLN COMPARISON:  None Available. Same day CT head. FINDINGS: CTA NECK FINDINGS Aortic arch: Atherosclerosis of the aorta and great vessel origins. Great vessel origins are patent without significant stenosis. Right carotid system: Atherosclerosis at the carotid bifurcation with approximately 50% stenosis of the ICA origin. Left carotid system: Extensive predominately calcific atherosclerosis at the carotid bifurcation with approximately 80% stenosis of the ICA origin. Vertebral arteries: Right dominant. Bilateral ICAs are patent without significant (greater than 50%) stenosis. Atherosclerosis at the left vertebral artery origin. Skeleton: Apparent increased size of lucent lesions involving the C7 vertebral body sub to CT chest from February 14, 23. Given the apparent increase in size and patient's known malignancy. Multilevel degenerative change. Other neck: No acute findings. Upper chest: Masslike consolidation in the visualized left upper lobe, which is similar in size (approximately 3.5 x 1.6 cm on series 7, image 288) and was better characterized on prior CT chest from 02/27/2022. emphysema. Aortic atherosclerosis. Review of the MIP images confirms the above findings CTA HEAD FINDINGS Anterior circulation: Bilateral intracranial ICAs are patent with mild narrowing due to calcific atherosclerosis. Bilateral M1 MCAs and ACAs are patent without proximal hemodynamically significant stenosis. Occlusion of a left superior M2 MCA branch proximally  (series 7, image 93; series 8, image 89). Posterior circulation: Bilateral intradural vertebral arteries, basilar artery, and bilateral posterior cerebral arteries are patent without proximal hemodynamically significant stenosis. Venous sinuses: As permitted by contrast timing, patent. CT Brain Perfusion Findings: ASPECTS: 10. CBF (<30%) Volume: 14mL Perfusion (Tmax>6.0s) volume: 31mL Mismatch Volume: InfantmL Infarction Location:None identified IMPRESSION: 1. 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 2. On CT perfusion, 19 mL area of increased T-max time in the left MCA territory, correlating with the above  occlusion and concerning for tissue at risk. 3. Approximately 80% left and 50% right ICA origin stenosis in the neck. 4. Apparent increased size of lucent lesions involving the C7 vertebral body relative to CT chest from February 14, 23. Given the apparent increase in size and patient's known malignancy, findings are concerning for osseous metastasis, but incompletely characterized on this study. An MRI with contrast or PET-CT could further evaluate if clinically warranted. 5. Masslike consolidation in the visualized left upper lobe, which is similar in size and was better characterized on prior CT chest from 02/27/2022. 6. Aortic Atherosclerosis (ICD10-I70.0) and Emphysema (ICD10-J43.9). Findings discussed with Dr. Theda Sers via telephone at 12:12 p.m. Electronically Signed   By: Margaretha Sheffield M.D.   On: 02/23/2022 12:17    Procedures .Critical Care Performed by: Lorelle Gibbs, DO Authorized by: Lorelle Gibbs, DO   Critical care provider statement:    Critical care time (minutes):  60   Critical care time was exclusive of:  Separately billable procedures and treating other patients   Critical care was necessary to treat or prevent imminent or life-threatening deterioration of the following conditions:  CNS failure or compromise and cardiac failure   Critical care was time spent personally  by me on the following activities:  Development of treatment plan with patient or surrogate, discussions with consultants, evaluation of patient's response to treatment, examination of patient, ordering and review of laboratory studies, ordering and review of radiographic studies, ordering and performing treatments and interventions, pulse oximetry, re-evaluation of patient's condition and review of old charts   I assumed direction of critical care for this patient from another provider in my specialty: no     Care discussed with: admitting provider      Medications Ordered in ED Medications  lactated ringers infusion (has no administration in time range)  sodium chloride flush (NS) 0.9 % injection 3 mL (3 mLs Intravenous Given 03/05/2022 1212)  iohexol (OMNIPAQUE) 350 MG/ML injection 100 mL (100 mLs Intravenous Contrast Given 03/06/2022 1153)    ED Course/ Medical Decision Making/ A&P                           Medical Decision Making Amount and/or Complexity of Data Reviewed Labs: ordered. Radiology: ordered.  Risk Prescription drug management. Decision regarding hospitalization.   This patient presents to the ED for concern of speech change/difficulty swallowing, this involves an extensive number of treatment options, and is a complaint that carries with it a high risk of complications and morbidity.  The differential diagnosis includes TIA, CVA, aphasia, mental status change   Additional history obtained: -Additional history obtained from EMS and daughter at bedside -External records from outside source obtained and reviewed including: Chart review including previous notes, labs, imaging, consultation notes   Lab Tests: -I ordered, reviewed, and interpreted labs.  The pertinent results include: Baseline findings, except an elevated troponin over 2000   EKG -New right bundle branch block with some ST elevation and Q waves in the inferior leads   Imaging Studies ordered: -I  ordered imaging studies including CT/CTA head imaging which shows an M2 occlusion -I agree with the radiologist interpretation   ED Course: 76 year old female presents emergency department with change in speech, difficulty swallowing and weakness.  Was evaluated as a code stroke with stroke neurology team at bedside.  CTA imaging is significant for an M2 occlusion.  She is not a tPA candidate given timeframe/anticoagulation/cancer diagnosis.  Dr. Theda Sers will give recommendations from a neurology standpoint.  EKG shows a new right bundle branch block with ST elevations and Q waves in the inferior leads.  She has no active chest pain.  Did have an episode of chest pain about a week ago where she was seen at an outside facility, I cannot see these documents.  Again no active chest pain but a troponin was added on which was elevated over 2000.  Spoke with cardiology, Dr. Johney Frame.  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  Treatments are limited secondary to stroke diagnosis.  Recommends obtaining an echo for further evaluation.  Will trend troponins, continues to be chest pain-free.   Consultations Obtained: I requested consultation with the stroke neurology, Dr. Theda Sers,  and discussed lab and imaging findings as well as pertinent plan - they recommend: Medical admission   Critical Interventions: Stroke neurology, code stroke, heart attack   Cardiac Monitoring: The patient was maintained on a cardiac monitor.  I personally viewed and interpreted the cardiac monitored which showed an underlying rhythm of: Sinus rhythm   Reevaluation: After the interventions noted above, I reevaluated the patient and found that they have :stayed the same   Dispostion: Patients evaluation and results requires admission for further treatment and care.  Spoke with hospitalist, reviewed patient's ED course and they accept admission.  Patient agrees with admission plan, offers  no new complaints and is stable/unchanged at time of admit.        Final Clinical Impression(s) / ED Diagnoses Final diagnoses:  Cerebral infarction, unspecified mechanism [facility])    Rx / DC Orders ED Discharge Orders     None         Lorelle Gibbs, DO 03/09/2022 [place], Alvin Critchley, DO 02/16/2022 1545

## 2022-03-12 NOTE — ED Notes (Signed)
Pt's trop [id number]. MD Horton made aware.

## 2022-03-12 NOTE — H&P (Signed)
History and Physical    Patient: Holly [name] TOI:[id number] DOB: Nov 21, 1945 DOA: 02/23/2022 DOS: the patient was seen and examined on 02/27/2022 PCP: Harlan Stains, MD  Patient coming from: Home  Chief Complaint:  Chief Complaint  Patient presents with   Code Stroke   HPI: Holly [name] is a 76 y.o. 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  Her daughter is present at bedside and helps provide additional history due to the patient being aphasic.  Patient was reported to start feeling weak 5 days ago, but had not been able to talk since Tuesday.  Initially question patient's pain medication regimen.  Daughter called her primary care provider and was advised to hold the fentanyl to see if symptoms improved. Since that time patient had been having difficulty swallowing and had not had much of an appetite.  However last night symptoms seem to be worse with complaints of right-sided weakness and right-sided facial droop and continued inability to talk for which 911 was called.  Patient had been seen last week at [facility] after having a fall and was told she had a compression fracture of T12 with degenerative disc changes.  At that time she had complaints of chest pain as well, but was told that it was likely related to the compression fracture.  They were later called and told on 5/20 that she had concern for urinary tract infection and recommended to start on cephalexin.  Upon admission into the emergency department patient was seen as a code stroke.   CT scan of the head did not note any acute intracranial abnormality with severe chronic microvascular ischemic disease with 8 mm round dural calcified lesion of the right frontal lobe suspicious for incidental meningioma.  Patient was not a thrombolytic candidate as she was on anticoagulation and was also out of the window.  Labs significant for WBC 12, hemoglobin 8.7, glucose 165, alkaline phosphatase 179, albumin 2.5, BNP 186.8, high-sensitivity troponin 2671-> 1865.  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  Chest x-ray noted no acute abnormality with stable areas of consolidation in the left upper lobe documented previously on CT. Case had been discussed with cardiology due to elevated troponin, but interventions limited and echocardiogram recommended to be obtained.  Review of Systems: unable to review all systems due to the inability of the patient to answer questions. Past Medical History:  Diagnosis Date   Anemia    blood transfusion November 2022   Anxiety    Asthma    Basal cell carcinoma    lung cancer.  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 Uses 2 L    Foot swelling    left- has shown to Drs.   Frequency of urination    Hardware complicating wound infection ([place]) 12/24/2021   Headache    History of skin cancer  MELANOMA ON NOSE   Hypercalcemia    Hypertension    Hypothyroidism    Mixed hyperlipidemia    MRSA infection 12/24/2021   Osteomyelitis of left tibia ([place]) 12/24/2021   Pneumonia    Pulmonary nodule    Sleep apnea    pt uses a cpap nightly   Thyroid neoplasm    Urinary incontinence    Vaccine counseling 02/08/2022   Wears dentures    Wears glasses    Past Surgical History:  Procedure Laterality Date   BIOPSY  02/17/2021   Procedure: BIOPSY;   Surgeon: Otis Brace, MD;  Location: [facility];  Service: Gastroenterology;;   BONE BIOPSY Left 12/13/2021   Procedure: BONE BIOPSY;  Surgeon: Shona Needles, MD;  Location: [place];  Service: Orthopedics;  Laterality: Left;   COLONOSCOPY     COLOSTOMY N/A 01/04/2022   Procedure: COLOSTOMY;  Surgeon: Stechschulte, Nickola Major, MD;  Location: [place];  Service: General;  Laterality: N/A;   DENTAL SURGERY     ELBOW ARTHROPLASTY Right 2020   ESOPHAGOGASTRODUODENOSCOPY (EGD) WITH PROPOFOL N/A 02/17/2021   Procedure: ESOPHAGOGASTRODUODENOSCOPY (EGD) WITH PROPOFOL;  Surgeon: Otis Brace, MD;  Location: [place];  Service: Gastroenterology;  Laterality: N/A;   ESOPHAGOGASTRODUODENOSCOPY (EGD) WITH PROPOFOL N/A 01/24/2022   Procedure: ESOPHAGOGASTRODUODENOSCOPY (EGD) WITH PROPOFOL;  Surgeon: Arta Silence, MD;  Location: [place];  Service: Gastroenterology;  Laterality: N/A;   EYE SURGERY Right    cataract surgery   FOOT SURGERY  30 YRS AGO   BILATERAL   HARDWARE REMOVAL Right 10/02/2020   Procedure: HARDWARE REMOVAL RIGHT ELBOW;  Surgeon: Shona Needles, MD;  Location: [place];  Service: Orthopedics;  Laterality: Right;   HEMOSTASIS CLIP PLACEMENT  01/24/2022   Procedure: HEMOSTASIS CLIP PLACEMENT;  Surgeon: Arta Silence, MD;  Location: [place];  Service: Gastroenterology;;   I & D EXTREMITY Right 01/13/2021   Procedure: IRRIGATION AND DEBRIDEMENT RIGHT ELBOW;  Surgeon: Shona Needles, MD;  Location: [place];  Service: Orthopedics;  Laterality: Right;   LAPAROTOMY N/A 01/04/2022   Procedure: EXPLORATORY LAPAROTOMY;  Surgeon: Felicie Morn, MD;  Location: [place];  Service: General;  Laterality: N/A;   ORIF ELBOW FRACTURE Right 12/06/2017   Procedure: OPEN REDUCTION INTERNAL FIXATION (ORIF) ELBOW/OLECRANON FRACTURE;  Surgeon: Shona Needles, MD;  Location: [place];  Service: Orthopedics;  Laterality: Right;   ORIF TIBIA PLATEAU Left 12/13/2021   Procedure: OPEN REDUCTION INTERNAL  FIXATION (ORIF) TIBIAL PLATEAU;  Surgeon: Shona Needles, MD;  Location: [place];  Service: Orthopedics;  Laterality: Left;   PARTIAL COLECTOMY N/A 01/04/2022   Procedure: SIGMOID COLECTOMY;  Surgeon: Felicie Morn, MD;  Location: [place];  Service: General;  Laterality: N/A;   SKIN CANCER EXCISION     THYROIDECTOMY N/A 07/31/2014   Procedure: TOTAL THYROIDECTOMY;  Surgeon: Armandina Gemma, MD;  Location: [facility];  Service: General;  Laterality: N/A;   VAGINAL HYSTERECTOMY     VIDEO BRONCHOSCOPY WITH ENDOBRONCHIAL NAVIGATION Bilateral 06/25/2019   Procedure: VIDEO BRONCHOSCOPY WITH ENDOBRONCHIAL NAVIGATION;  Surgeon: Lajuana Matte, MD;  Location: [place];  Service: Thoracic;  Laterality: Bilateral;   Social History:  reports that she quit smoking about 7 years ago. Her smoking use included cigarettes. She has a 30.00 pack-year smoking history. She has never used smokeless tobacco. She reports current alcohol use. She reports that she does not use drugs.  Allergies  Allergen Reactions   Amlodipine Besylate Swelling    Edema   Codeine Nausea  And Vomiting   Oxycodone-Acetaminophen Nausea And Vomiting    Family History  Problem Relation Age of Onset   Coronary artery disease Father    Hypertension Father    Asthma Father    Allergies Father    Hypertension Mother    Lung cancer Mother     Prior to Admission medications   Medication Sig Start Date End Date Taking? Authorizing Provider  acetaminophen (TYLENOL) 325 MG tablet Take 2 tablets (650 mg total) by mouth 4 (four) times daily -  before meals and at bedtime. 01/27/22  Yes Setzer, Edman Circle, PA-C  albuterol (PROVENTIL) (2.5 MG/3ML) 0.083% nebulizer solution Take 2.5 mg by nebulization 3 (three) times daily as needed for wheezing or shortness of breath.   Yes [provider]  ALPRAZolam (XANAX) 0.5 MG tablet Take 1 tablet (0.5 mg total) by mouth 2 (two) times daily. 01/27/22  Yes Setzer, Edman Circle, PA-C  apixaban (ELIQUIS) 5 MG  TABS tablet Take 1 tablet (5 mg total) by mouth 2 (two) times daily. 01/31/22 04/13/22 Yes Setzer, Edman Circle, PA-C  atorvastatin (LIPITOR) 20 MG tablet Take 20 mg by mouth at bedtime.    Yes [provider]  buPROPion (WELLBUTRIN XL) 300 MG 24 hr tablet Take 300 mg by mouth every morning. 07/08/21  Yes [provider]  busPIRone (BUSPAR) 5 MG tablet Take 1 tablet (5 mg total) by mouth 2 (two) times daily. 01/27/22  Yes Setzer, Edman Circle, PA-C  cephALEXin (KEFLEX) 500 MG capsule Take 500 mg by mouth 3 (three) times daily. 03/05/22  Yes [provider]  donepezil (ARICEPT) 10 MG tablet Take 1 tablet (10 mg total) by mouth at bedtime. 01/27/22  Yes Setzer, Edman Circle, PA-C  doxycycline (VIBRA-TABS) 100 MG tablet Take 1 tablet (100 mg total) by mouth 2 (two) times daily. To start AFTER finishing the daptomycin Patient taking differently: Take 100 mg by mouth 2 (two) times daily. To start AFTER finishing the daptomycin  ( started on 02-08-22)  30 DS 02/08/22  Yes Tommy Medal, Lavell Islam, MD  Fluticasone-Umeclidin-Vilant 100-62.5-25 MCG/INH AEPB Inhale 1 puff into the lungs daily. Trelegy   Yes [provider]  furosemide (LASIX) 20 MG tablet Take 20 mg by mouth 3 (three) times a week. 02/17/22  Yes [provider]  levothyroxine (SYNTHROID) 137 MCG tablet Take 1 tablet (137 mcg total) by mouth daily before breakfast. 01/27/22  Yes Setzer, Edman Circle, PA-C  losartan (COZAAR) 50 MG tablet Take 25 mg by mouth daily. 02/28/22  Yes [provider]  metoprolol succinate (TOPROL-XL) 25 MG 24 hr tablet Take 1 tablet (25 mg total) by mouth daily. Take with or immediately following a meal. 01/27/22  Yes Setzer, Edman Circle, PA-C  mirtazapine (REMERON) 30 MG tablet Take 1 tablet (30 mg total) by mouth at bedtime. 01/27/22  Yes Setzer, Edman Circle, PA-C  Multiple Vitamin (MULTIVITAMIN WITH MINERALS) TABS tablet Take 1 tablet by mouth daily. 01/28/22  Yes Setzer, Edman Circle, PA-C  Nystatin  (GERHARDT'S BUTT CREAM) CREA Apply 1 application. topically 2 (two) times daily. Patient taking differently: Apply 1 application. topically 2 (two) times daily as needed for irritation (skin breakdown). 01/27/22  Yes Setzer, Edman Circle, PA-C  oxyCODONE (OXY IR/ROXICODONE) 5 MG immediate release tablet Take 1 tablet (5 mg total) by mouth every 4 (four) hours as needed for severe pain. 01/27/22  Yes Setzer, Edman Circle, PA-C  pantoprazole (PROTONIX) 40 MG tablet Take 1 tablet (40 mg total) by mouth 2 (  two) times daily. After 03/09/2022, take only one tablet each day. 01/27/22 02/24/2022 Yes Setzer, Edman Circle, PA-C  predniSONE (DELTASONE) 20 MG tablet Take 20 mg by mouth daily. continuous 07/08/21  Yes [provider]  SANTYL 250 UNIT/GM ointment Apply topically at bedtime. 02/17/22  Yes [provider]  VENTOLIN HFA 108 (90 Base) MCG/ACT inhaler INHALE 2 PUFFS INTO THE LUNGS EVERY 4 HOURS AS NEEDED FOR WHEEZING ORSHORTNESS OF BREATH Patient taking differently: Inhale 2 puffs into the lungs every 4 (four) hours as needed for wheezing or shortness of breath. 06/29/17  Yes Parrett, Tammy S, NP  DAPTOmycin 500 mg in sodium chloride 0.9 % 50 mL Inject 500 mg into the vein daily at 8 pm. Patient not taking: Reported on 03/02/2022 01/27/22   Barbie Banner, PA-C  fentaNYL (DURAGESIC) 12 MCG/HR Place 1 patch onto the skin every 3 (three) days. 03/05/22   [provider]  leptospermum manuka honey (MEDIHONEY) PSTE paste Apply 1 application. topically daily. Patient not taking: Reported on 03/05/2022 01/28/22   Barbie Banner, PA-C  naloxone [facility]) nasal spray 4 mg/0.1 mL 1 spray daily. 01/27/22   [provider]    Physical Exam: Vitals:   03/11/2022 1345 03/05/2022 1400 02/27/2022 1415 02/20/2022 1513  BP: 123/61  100/65   Pulse: 82 80 83 87  Resp: (!) 25 (!) 24 (!) 26 17  Temp:      TempSrc:      SpO2: 100% 100% 100% 100%  Weight:       Constitutional: Elderly female currently in no  acute distress  Eyes: PERRL, lids and conjunctivae normal ENMT: Mucous membranes are moist. Posterior pharynx clear of any exudate or lesions.Normal dentition.  Neck: normal, supple, no masses, no thyromegaly Respiratory: clear to auscultation bilaterally, no wheezing, no crackles. Normal respiratory effort. No accessory muscle use.  Cardiovascular: Regular rate and rhythm, no murmurs / rubs / gallops. 2+ pedal pulses. No carotid bruits.  Abdomen: no tenderness, no masses palpated. No hepatosplenomegaly. Bowel sounds positive.  Musculoskeletal: no clubbing / cyanosis. No joint deformity upper and lower extremities. Good ROM, no contractures. Normal muscle tone.  Skin: Pressure ulcer of the sacrum as seen below  Neurologic: CN 2-12 grossly intact. Sensation intact, DTR normal. Strength 5/5 in all 4.  Psychiatric: Normal judgment and insight. Alert and oriented x 3. Normal mood.   Data Reviewed:   EKG revealed new right bundle branch block for ST wave changes and Q waves in the inferior leads.  Otherwise reviewed labs, imaging, and pertinent records as noted above in HPI Assessment and Plan: CVA Acute/subacute.  Patient reported feeling weak days ago, and has subsequently been aphasic.  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  Patient not a thrombolytic candidate due to being on Eliquis.  She is also out of window for any intervention. -Admit to telemetry bed -Stroke order set initiated -Neuro checks -Check  MRI brain -Check Hemoglobin A1c and lipid panel   -Check echocardiogram -PT/OT/Speech to eval and treat -Appreciate neurology consultative services, will follow-up  -Social work consult   Elevated troponin new right bundle branch block Acute.  Patient reported complaints of chest pain last week when evaluated [facility], but has not had chest pain since.  High-sensitivity troponins 2671-> 1865.  Case had initially been discussed  with Dr. Johney Frame who recommended checking echocardiogram. -Follow-up echocardiogram -Heparin drip per pharmacy -Dr. Humphrey Rolls of Cardiology consulted to formally evaluate  due to hypotension  Leukocytosis SIRS Acute.  Patient was noted to be tachypneic with initial WBC 12.  Initial lactic acid been obtained.  Symptoms thought to be secondary to above. -Add on lactic acid -Check blood and urine cultures  Hypotension Acute.  Blood pressures noted to be as low as 107/40.  Patient had been bolused 500 mL of lactated Ringer's and then placed a rate of 100 mL/h.  Home blood pressure medications include metoprolol succinate 25 mg daily, furosemide 20 mg 3 times week, and losartan 25 mg daily. -Hold home blood pressure regimen   -Continue IV fluids to maintain maps greater than 65 -Patient is on chronic steroids question possibility of adrenal insufficiency. Give  Solu-Medrol IV as hydrocortisone on backorder  Urinary tract infection Prior to arrival.  Patient has been found to have a urinary tract infection after being seen at [facility] last week for which she was started on cephalexin. -Check urinalysis and urine culture  Osteomyelitis Patient had been treated with daptomycin after suffering a tibial plateau fracture status post ORIF with findings concerning for osteomyelitis.  Cultures positive for MRSA.  Treated with daptomycin transition to p.o. doxycycline. -Continue Doxycycline  COPD chronic respiratory failure with hypoxia At baseline patient is on 3 L nasal cannula oxygen.  O2 saturations appear currently maintained on current regimen.  Patient on chronic steroids 20 mg daily. 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 -Held Eliquis -Heparin drip per pharmacy  Sacral pressure ulcer Patient with what appears to be stage III pressure ulcer of the sacrum. Being followed by wound care in the outpatient setting. -Low-air-loss mattress replaced -Wound care   Malignant neoplasm of the lung Patient is not currently receiving chemotherapy or radiation.  Noted to have concern for possible metastases to the left hip as well as liver.  She confirms DO NOT RESUSCITATE order should remain in place. Patient is being followed by palliative care in the outpatient setting.  History of diverticulitis colostomy in place status post sigmoid colectomy for sigmoid diverticulitis with perforation in 12/2021. -Ostomy care  Anemia of chronic kidney disease Chronic.  Hemoglobin 8.7 with repeat check 9.9. -Continue to monitor  Thrombocytopenia Acute.  Platelet count 120 which appears similar to previous. -Continue to monitor  Anxiety and depression -Continue BuSpar, Wellbutrin, and Xanax as needed  Hypothyroidism TSH was last noted to be 0.993 on 08/28/2021. -Check TSH -Continue levothyroxine  Gerd -Protonix  OSA on CPAP -Continue CPAP nightly  DNR   DVT prophylaxis: Heparin  Advance Care Planning:   Code Status: DNR verified with patient's daughter present at bedside  Consults: Neurology  Family Communication: Daughter updated at bedside  Severity of Illness: The appropriate patient status for this patient is INPATIENT. Inpatient status is judged to be reasonable and necessary in order to provide the required intensity of service to ensure the patient's safety. The patient's presenting symptoms, physical exam findings, and initial radiographic and laboratory data in the context of their chronic comorbidities is felt to place them at high risk for further  clinical deterioration. Furthermore, it is not anticipated that the patient will be  medically stable for discharge from the hospital within 2 midnights of admission.   * I certify that at the point of admission it is my clinical judgment that the patient will require inpatient hospital care spanning beyond 2 midnights from the point of admission due to high intensity of service, high risk for further deterioration and high frequency of surveillance required.*  Author: Norval Morton, MD 02/19/2022 3:44 PM  For on call review [email or web address].

## 2022-03-12 NOTE — Consult Note (Signed)
Cardiology Consultation:   Patient ID: Holly [name] MRN: [id number]; DOB: 09/03/1946  Admit date: 02/28/2022 Date of Consult: 03/02/2022  PCP:  Harlan Stains, MD   [facility] Providers Cardiologist:  None        Patient Profile:   Holly [name] is a 76 y.o. 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 Tamala Julian  History of Present Illness:   Holly [name] is a 76 y.o. 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 Tamala Julian. She presented today with aphasia and had not been able to talk since Tuesday. Also difficulty swallowing. 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 Deemed not to be a thrombectomy or tpa candidate. Admitted to the hospitalist service. Troponin was checked which was in 2000s which has downtrended to 1000s now. No chest pain in the recent past. EKG showed RBBB and Q waves in inferior leads. . Blood pressures at home per daughter are SBP 100-120; here slightly softer but overall similar. Cardiology consulted. In the ED, Case had been discussed with cardiology due to  elevated troponin, but interventions limited and echocardiogram recommended to be obtained.  Past Medical History:  Diagnosis Date   Anemia    blood transfusion November 2022   Anxiety    Asthma    Basal cell carcinoma    lung cancer.  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 Uses 2 L    Foot swelling    left- has shown to Drs.   Frequency of urination    Hardware complicating wound infection ([place]) 12/24/2021   Headache    History of skin cancer    MELANOMA ON NOSE   Hypercalcemia    Hypertension    Hypothyroidism    Mixed hyperlipidemia    MRSA infection 12/24/2021   Osteomyelitis of left tibia ([place]) 12/24/2021   Pneumonia    Pulmonary nodule    Sleep apnea    pt uses a cpap nightly   Thyroid neoplasm    Urinary incontinence    Vaccine counseling 02/08/2022   Wears dentures    Wears glasses     Past Surgical History:  Procedure Laterality Date   BIOPSY  02/17/2021   Procedure: BIOPSY;  Surgeon: Otis Brace, MD;  Location: [facility];  Service: Gastroenterology;;   BONE BIOPSY Left 12/13/2021   Procedure: BONE BIOPSY;  Surgeon: Shona Needles, MD;  Location: [place];  Service: Orthopedics;  Laterality: Left;  COLONOSCOPY     COLOSTOMY N/A 01/04/2022   Procedure: COLOSTOMY;  Surgeon: Stechschulte, Nickola Major, MD;  Location: [place];  Service: General;  Laterality: N/A;   DENTAL SURGERY     ELBOW ARTHROPLASTY Right 2020   ESOPHAGOGASTRODUODENOSCOPY (EGD) WITH PROPOFOL N/A 02/17/2021   Procedure: ESOPHAGOGASTRODUODENOSCOPY (EGD) WITH PROPOFOL;  Surgeon: Otis Brace, MD;  Location: [place];  Service: Gastroenterology;  Laterality: N/A;   ESOPHAGOGASTRODUODENOSCOPY (EGD) WITH PROPOFOL N/A 01/24/2022   Procedure: ESOPHAGOGASTRODUODENOSCOPY (EGD) WITH PROPOFOL;  Surgeon: Arta Silence, MD;   Location: [place];  Service: Gastroenterology;  Laterality: N/A;   EYE SURGERY Right    cataract surgery   FOOT SURGERY  30 YRS AGO   BILATERAL   HARDWARE REMOVAL Right 10/02/2020   Procedure: HARDWARE REMOVAL RIGHT ELBOW;  Surgeon: Shona Needles, MD;  Location: [place];  Service: Orthopedics;  Laterality: Right;   HEMOSTASIS CLIP PLACEMENT  01/24/2022   Procedure: HEMOSTASIS CLIP PLACEMENT;  Surgeon: Arta Silence, MD;  Location: [place];  Service: Gastroenterology;;   I & D EXTREMITY Right 01/13/2021   Procedure: IRRIGATION AND DEBRIDEMENT RIGHT ELBOW;  Surgeon: Shona Needles, MD;  Location: [place];  Service: Orthopedics;  Laterality: Right;   LAPAROTOMY N/A 01/04/2022   Procedure: EXPLORATORY LAPAROTOMY;  Surgeon: Felicie Morn, MD;  Location: [place];  Service: General;  Laterality: N/A;   ORIF ELBOW FRACTURE Right 12/06/2017   Procedure: OPEN REDUCTION INTERNAL FIXATION (ORIF) ELBOW/OLECRANON FRACTURE;  Surgeon: Shona Needles, MD;  Location: [place];  Service: Orthopedics;  Laterality: Right;   ORIF TIBIA PLATEAU Left 12/13/2021   Procedure: OPEN REDUCTION INTERNAL FIXATION (ORIF) TIBIAL PLATEAU;  Surgeon: Shona Needles, MD;  Location: [place];  Service: Orthopedics;  Laterality: Left;   PARTIAL COLECTOMY N/A 01/04/2022   Procedure: SIGMOID COLECTOMY;  Surgeon: Felicie Morn, MD;  Location: [place];  Service: General;  Laterality: N/A;   SKIN CANCER EXCISION     THYROIDECTOMY N/A 07/31/2014   Procedure: TOTAL THYROIDECTOMY;  Surgeon: Armandina Gemma, MD;  Location: [facility];  Service: General;  Laterality: N/A;   VAGINAL HYSTERECTOMY     VIDEO BRONCHOSCOPY WITH ENDOBRONCHIAL NAVIGATION Bilateral 06/25/2019   Procedure: VIDEO BRONCHOSCOPY WITH ENDOBRONCHIAL NAVIGATION;  Surgeon: Lajuana Matte, MD;  Location: [facility];  Service: Thoracic;  Laterality: Bilateral;      Inpatient Medications: Scheduled Meds:  atorvastatin  20 mg Oral QHS   [START ON 03/13/2022] buPROPion   300 mg Oral q morning   busPIRone  5 mg Oral BID   cephALEXin  500 mg Oral TID   collagenase   Topical QHS   donepezil  10 mg Oral QHS   doxycycline  100 mg Oral BID   fluticasone furoate-vilanterol  1 puff Inhalation Daily   And   umeclidinium bromide  1 puff Inhalation Daily   [START ON 03/13/2022] levothyroxine  137 mcg Oral QAC breakfast   mirtazapine  30 mg Oral QHS   pantoprazole  40 mg Oral BID   [START ON 03/13/2022] predniSONE  20 mg Oral Q breakfast   Continuous Infusions:  heparin 700 Units/hr (03/16/2022 2011)   lactated ringers 100 mL/hr at 03/11/2022 1526   PRN Meds: acetaminophen **OR** acetaminophen (TYLENOL) oral liquid 160 mg/5 mL **OR** acetaminophen, albuterol, ALPRAZolam, oxyCODONE  Allergies:    Allergies  Allergen Reactions   Amlodipine Besylate Swelling    Edema   Codeine Nausea And Vomiting   Oxycodone-Acetaminophen Nausea And Vomiting    Social History:  Social History   Socioeconomic History   Marital status: Widowed    Spouse name: Not on file   Number of children: 2   Years of education: Not on file   Highest education level: Not on file  Occupational History   Occupation: [employer]  Tobacco Use   Smoking status: Former    Packs/day: 1.00    Years: 30.00    Pack years: 30.00    Types: Cigarettes    Quit date: 02/24/2015    Years since quitting: 7.0   Smokeless tobacco: Never  Vaping Use   Vaping Use: Former  Substance and Sexual Activity   Alcohol use: Yes    Alcohol/week: 0.0 standard drinks    Comment: very seldom   Drug use: Never   Sexual activity: Not Currently    Comment: Hysterectomy  Other Topics Concern   Not on file  Social History Narrative   Not on file   Social Determinants of Health   Financial Resource Strain: Not on file  Food Insecurity: Not on file  Transportation Needs: Not on file  Physical Activity: Not on file  Stress: Not on file  Social Connections: Not on file  Intimate Partner Violence:  Not on file    Family History:   Family History  Problem Relation Age of Onset   Coronary artery disease Father    Hypertension Father    Asthma Father    Allergies Father    Hypertension Mother    Lung cancer Mother      ROS:  Please see the history of present illness.  All other ROS reviewed and negative.     Physical Exam/Data:   Vitals:   02/15/2022 1750 03/02/2022 1753 03/06/2022 1800 03/03/2022 1900  BP: (!) 118/41  116/61   Pulse: 82 85 85   Resp: (!) 28 20 (!) 25   Temp:    98.6 F (37 C)  TempSrc:      SpO2: 100% 100% 100%   Weight:       No intake or output data in the 24 hours ending 03/14/2022 2045    03/14/2022   11:00 AM 03/08/2022    3:29 PM 01/26/2022    3:45 PM  Last 3 Weights  Weight (lbs) 122 lb 9.2 oz 126 lb 4.8 oz 139 lb 15.9 oz  Weight (kg) 55.6 kg 57.289 kg 63.5 kg     Body mass index is 23.94 kg/m.  General:  Elder female in no acute distress HEENT: normal Neck: no JVD Vascular: No carotid bruits; Distal pulses 2+ bilaterally Cardiac:  normal S1, S2; RRR; no murmur  Lungs:  Poor airflow Abd: soft, nontender, no hepatomegaly  Ext: no edema Neuro:  Aphasia Psych:  Normal affect   EKG:  The EKG was personally reviewed and demonstrates RBBB and Q waves in inferior leads.   Relevant CV Studies:  Echo 2022: Normal EF  Laboratory Data:  High Sensitivity Troponin:   Recent Labs  Lab 02/23/2022 1128 02/26/2022 1457  TROPONINIHS 2,671* 1,865*     Chemistry Recent Labs  Lab 03/08/22 1510 02/16/2022 1128 02/15/2022 1132  NA 142 137 137  K 4.8 4.0 3.9  CL 104 97* 96*  CO2 33* 32  --   GLUCOSE 188* 165* 161*  BUN 32* 17 19  CREATININE 0.87 0.86 0.90  CALCIUM 9.7 9.8  --   GFRNONAA >60 >60  --   ANIONGAP 5 8  --     Recent Labs  Lab 03/08/22  1510 02/23/2022 1128  PROT 6.1* 5.9*  ALBUMIN 2.8* 2.5*  AST 33 36  ALT 17 21  ALKPHOS 199* 179*  BILITOT 0.3 0.6   Lipids No results for input(s): CHOL, TRIG, HDL, LABVLDL, LDLCALC, CHOLHDL in the  last 168 hours.  Hematology Recent Labs  Lab 03/08/22 1510 03/02/2022 1128 02/22/2022 1132  WBC 13.0* 12.0*  --   RBC 3.29* 3.12*  --   HGB 9.0* 8.7* 9.9*  HCT 31.2* 29.7* 29.0*  MCV 94.8 95.2  --   MCH 27.4 27.9  --   MCHC 28.8* 29.3*  --   RDW 17.5* 17.6*  --   PLT 114* 120*  --    Thyroid No results for input(s): TSH, FREET4 in the last 168 hours.  BNP Recent Labs  Lab 03/09/2022 1128  BNP 186.8*    DDimer No results for input(s): DDIMER in the last 168 hours.   Radiology/Studies:  DG Chest Port 1 View  Result Date: 02/21/2022 CLINICAL DATA:  Weakness. Stroke. Non-small-cell lung cancer treated with chemotherapy and radiation therapy. EXAM: PORTABLE CHEST 1 VIEW COMPARISON:  08/28/2021.  Chest CT dated 11/30/2021. FINDINGS: Mildly enlarged cardiac silhouette, accentuated above prominent pericardial and epicardial fat on the previous CT. Mild patchy and linear opacity in the left upper lobe, similar to the previous CT. The previously demonstrated right middle lobe mass is currently visualized. No pleural fluid. Mildly tortuous and calcified thoracic aorta. Mild scoliosis. IMPRESSION: No acute abnormality. Grossly stable areas of consolidation in the left upper lobe compared to the previous CT dated 11/30/2021 and described in more detail in that report. Electronically Signed   By: Claudie Revering M.D.   On: 03/06/2022 13:31   CT HEAD CODE STROKE WO CONTRAST  Result Date: 02/19/2022 CLINICAL DATA:  Code stroke.  Neuro deficit, acute, stroke suspected EXAM: CT HEAD WITHOUT CONTRAST TECHNIQUE: Contiguous axial images were obtained from the base of the skull through the vertex without intravenous contrast. RADIATION DOSE REDUCTION: This exam was performed according to the departmental dose-optimization program which includes automated exposure control, adjustment of the mA and/or kV according to patient size and/or use of iterative reconstruction technique. COMPARISON:  CT head July 24, 2021. FINDINGS: Brain: No evidence of acute infarction, hemorrhage, hydrocephalus, or extra-axial collection. Redemonstrated 8 mm rounded dural-based calcified focus overlying the right frontal lobe. Similar severe patchy white matter hypodensities in the white matter, nonspecific but compatible with chronic microvascular ischemic disease. Vascular: Calcific intracranial spaces. No evidence vessel identified. Skull: No acute fracture. Sinuses/Orbits: Clear sinuses.  No acute orbital findings. Other: No mastoid effusions. ASPECTS [facility] Stroke Program Early CT Score) total score (0-10 with 10 being normal): 10. IMPRESSION: 1. No evidence of acute intracranial abnormality.  ASPECTS is 10. 2. Severe chronic microvascular ischemic disease. 3. Redemonstrated 8 mm rounded dural-based calcified focus overlying the right frontal lobe, suspicious for an incidental meningioma. Code stroke imaging results were communicated on 02/27/2022 at 11:39 am to provider [facility] via secure text paging. Electronically Signed   By: Margaretha Sheffield M.D.   On: 02/26/2022 11:42   CT ANGIO HEAD NECK W WO CM W PERF (CODE STROKE)  Result Date: 02/21/2022 CLINICAL DATA:  Neuro deficit, acute, stroke suspected Right weakness and aphasia symptom onset Monday EXAM: CT ANGIOGRAPHY HEAD AND NECK CT PERFUSION BRAIN TECHNIQUE: Multidetector CT imaging of the head and neck was performed using the standard protocol during bolus administration of intravenous contrast. Multiplanar CT image reconstructions and MIPs were obtained to evaluate  the vascular anatomy. Carotid stenosis measurements (when applicable) are obtained utilizing NASCET criteria, using the distal internal carotid diameter as the denominator. Multiphase CT imaging of the brain was performed following IV bolus contrast injection. Subsequent parametric perfusion maps were calculated using RAPID software. RADIATION DOSE REDUCTION: This exam was performed according to the departmental  dose-optimization program which includes automated exposure control, adjustment of the mA and/or kV according to patient size and/or use of iterative reconstruction technique. CONTRAST:  165mL OMNIPAQUE IOHEXOL 350 MG/ML SOLN COMPARISON:  None Available. Same day CT head. FINDINGS: CTA NECK FINDINGS Aortic arch: Atherosclerosis of the aorta and great vessel origins. Great vessel origins are patent without significant stenosis. Right carotid system: Atherosclerosis at the carotid bifurcation with approximately 50% stenosis of the ICA origin. Left carotid system: Extensive predominately calcific atherosclerosis at the carotid bifurcation with approximately 80% stenosis of the ICA origin. Vertebral arteries: Right dominant. Bilateral ICAs are patent without significant (greater than 50%) stenosis. Atherosclerosis at the left vertebral artery origin. Skeleton: Apparent increased size of lucent lesions involving the C7 vertebral body sub to CT chest from February 14, 23. Given the apparent increase in size and patient's known malignancy. Multilevel degenerative change. Other neck: No acute findings. Upper chest: Masslike consolidation in the visualized left upper lobe, which is similar in size (approximately 3.5 x 1.6 cm on series 7, image 288) and was better characterized on prior CT chest from 02/27/2022. emphysema. Aortic atherosclerosis. Review of the MIP images confirms the above findings CTA HEAD FINDINGS Anterior circulation: Bilateral intracranial ICAs are patent with mild narrowing due to calcific atherosclerosis. Bilateral M1 MCAs and ACAs are patent without proximal hemodynamically significant stenosis. Occlusion of a left superior M2 MCA branch proximally (series 7, image 93; series 8, image 89). Posterior circulation: Bilateral intradural vertebral arteries, basilar artery, and bilateral posterior cerebral arteries are patent without proximal hemodynamically significant stenosis. Venous sinuses: As permitted  by contrast timing, patent. CT Brain Perfusion Findings: ASPECTS: 10. CBF (<30%) Volume: 86mL Perfusion (Tmax>6.0s) volume: 32mL Mismatch Volume: InfantmL Infarction Location:None identified IMPRESSION: 1. 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 2. On CT perfusion, 19 mL area of increased T-max time in the left MCA territory, correlating with the above occlusion and concerning for tissue at risk. 3. Approximately 80% left and 50% right ICA origin stenosis in the neck. 4. Apparent increased size of lucent lesions involving the C7 vertebral body relative to CT chest from February 14, 23. Given the apparent increase in size and patient's known malignancy, findings are concerning for osseous metastasis, but incompletely characterized on this study. An MRI with contrast or PET-CT could further evaluate if clinically warranted. 5. Masslike consolidation in the visualized left upper lobe, which is similar in size and was better characterized on prior CT chest from 02/27/2022. 6. Aortic Atherosclerosis (ICD10-I70.0) and Emphysema (ICD10-J43.9). Findings discussed with Dr. Theda Sers via telephone at 12:12 p.m. Electronically Signed   By: Margaretha Sheffield M.D.   On: 02/26/2022 12:17     Assessment and Plan:   # Elevated Troponin # EKG Changes # Acute Stroke # Hypotension  -Troponin elevation maybe due to demand in the setting of stress/stroke- however cannot entirely rule out ACS. Regardless, in the setting of acute stroke, ?metastatic cancer and co-morbidities, options limited especially with no chest pain. Will suggest to trend troponin and repeat EKG -Heparin as per neurology in the setting of stroke -Aspirin and statin -Recommend to get Echo in AM -She looks very dry and frail  on exam + sacral ulcer concerning for infection + adrenal insufficiency. Hypotension seems more non-cardiac related for now- will suggest to give fluids aggressively (her last echo in 2022 had normal EF) -Hold all BP medications  including BB.   For questions or updates, please contact [place] Please consult [email or web address] for contact info under    Signed, Jaci Lazier, MD  03/13/2022 8:45 PM

## 2022-03-12 NOTE — Consult Note (Signed)
Neurology Stroke Consult H&P  ROWENE SUTO MR# [id number] 02/14/2022  CC: right sided face droop and aphasia  History is obtained from: EMS, family and chart.  HPI: LABRENDA LASKY is a 76 y.o. female PMHx as reviewed below noted to feel weakness 03/07/2022. She has a history of cancer and her PCP advised the patient to remove fentanyl patch however, symptoms did not resolve. This morning family noted mild right face droop and inability to swallow morning medications mild right sided weakness as well as difficulty speaking and EMS brought patient in for further evaluation.    LKW: unclear tNK given: No apixaban, active metastatic disease. IR Thrombectomy No, not indicated due to high MRS Modified Rankin Scale: 5-Severe disability-bedridden, incontinent, needs constant attention NIHSS: 8  ROS: A complete ROS was performed and is negative except as noted in the HPI.   Past Medical History:  Diagnosis Date   Anemia    blood transfusion November 2022   Anxiety    Asthma    Basal cell carcinoma    lung cancer.  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 Uses 2 L    Foot swelling    left- has shown to Drs.   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 Her smoking use included cigarettes. She has a 30.00 pack-year smoking history. She has never used smokeless tobacco. She reports current alcohol use. She reports that she does not use drugs.   Prior to Admission medications   Medication Sig Start Date End Date Taking? Authorizing Provider  acetaminophen (TYLENOL) 325 MG tablet Take 2 tablets (650 mg total) by mouth 4 (four) times daily -  before meals and at bedtime. 01/27/22   Setzer, Edman Circle, PA-C  albuterol (PROVENTIL) (2.5 MG/3ML) 0.083% nebulizer solution Take 2.5 mg by nebulization 3 (three) times daily as needed for wheezing or shortness of breath.    [provider]  ALPRAZolam Duanne Moron) 0.5 MG tablet Take 1 tablet (0.5 mg total) by mouth 2 (two) times daily. 01/27/22   Setzer, Edman Circle, PA-C  apixaban (ELIQUIS) 5 MG TABS tablet Take 1 tablet (5 mg total) by mouth 2 (two) times daily. 01/31/22 04/13/22  Setzer, Edman Circle, PA-C  atorvastatin (LIPITOR) 20 MG tablet Take 20 mg by mouth at bedtime.     [provider]  buPROPion [facility]  XL) 300 MG 24 hr tablet Take 300 mg by mouth every morning. 07/08/21   [provider]  busPIRone (BUSPAR) 5 MG tablet Take 1 tablet (5 mg total) by mouth 2 (two) times daily. 01/27/22   Setzer, Edman Circle, PA-C  cephALEXin (KEFLEX) 500 MG capsule Take 500 mg by mouth 3 (three) times daily. 03/05/22   [provider]  DAPTOmycin 500 mg in sodium chloride 0.9 % 50 mL Inject 500 mg into the vein daily at 8 pm. 01/27/22   Setzer, Edman Circle, PA-C  donepezil (ARICEPT) 10 MG tablet Take 1 tablet (10 mg total) by mouth at bedtime. 01/27/22   Setzer, Edman Circle, PA-C  doxycycline (VIBRA-TABS) 100 MG tablet Take 1 tablet (100 mg total) by mouth 2 (two) times daily. To start AFTER finishing the daptomycin 02/08/22   Tommy Medal, Lavell Islam, MD  fentaNYL (DURAGESIC) 12  MCG/HR Place 1 patch onto the skin every 3 (three) days. 03/05/22   [provider]  Fluticasone-Umeclidin-Vilant 100-62.5-25 MCG/INH AEPB Inhale 1 puff into the lungs daily. Trelegy    [provider]  furosemide (LASIX) 20 MG tablet Take 20 mg by mouth daily. 02/17/22   [provider]  leptospermum manuka honey (MEDIHONEY) PSTE paste Apply 1 application. topically daily. 01/28/22   Setzer, Edman Circle, PA-C  levothyroxine (SYNTHROID) 137 MCG tablet Take 1 tablet (137 mcg total) by mouth daily before breakfast. 01/27/22   Setzer, Edman Circle, PA-C  losartan (COZAAR) 50 MG tablet Take 50 mg by mouth daily. 02/28/22   [provider]  metoprolol succinate (TOPROL-XL) 25 MG 24 hr tablet Take 1 tablet (25 mg total) by mouth daily. Take with or immediately following a meal. 01/27/22   Setzer, Edman Circle, PA-C  mirtazapine (REMERON) 30 MG tablet Take 1 tablet (30 mg total) by mouth at bedtime. 01/27/22   Setzer, Edman Circle, PA-C  Multiple Vitamin (MULTIVITAMIN WITH MINERALS) TABS tablet Take 1 tablet by mouth daily. 01/28/22   Setzer, Edman Circle, PA-C  naloxone [facility]) nasal spray 4 mg/0.1 mL 1 spray daily. 01/27/22   [provider]  Nystatin (GERHARDT'S BUTT CREAM) CREA Apply 1 application. topically 2 (two) times daily. 01/27/22   Setzer, Edman Circle, PA-C  oxyCODONE (OXY IR/ROXICODONE) 5 MG immediate release tablet Take 1 tablet (5 mg total) by mouth every 4 (four) hours as needed for severe pain. 01/27/22   Setzer, Edman Circle, PA-C  pantoprazole (PROTONIX) 40 MG tablet Take 1 tablet (40 mg total) by mouth 2 (two) times daily. After 03/09/2022, take only one tablet each day. 01/27/22 03/09/22  Setzer, Edman Circle, PA-C  predniSONE (DELTASONE) 20 MG tablet Take 20 mg by mouth daily. continuous 07/08/21   [provider]  SANTYL 250 UNIT/GM ointment Apply topically at bedtime. 02/17/22   [provider]  VENTOLIN HFA 108 (90 Base) MCG/ACT inhaler INHALE 2 PUFFS INTO THE LUNGS  EVERY 4 HOURS AS NEEDED FOR WHEEZING ORSHORTNESS OF BREATH Patient taking differently: Inhale 2 puffs into the lungs every 4 (four) hours as needed for wheezing or shortness of breath. 06/29/17   Parrett, Fonnie Mu, NP  vitamin C (ASCORBIC ACID) 500 MG tablet Take 500 mg by mouth daily.    [provider]    Exam: Current vital signs: Wt 55.6 kg   BMI 23.94 kg/m   Physical Exam  Constitutional: Appears well-developed and well-nourished.  Psych: Affect appropriate to situation Eyes: No scleral injection HENT: No OP obstruction. Head: Normocephalic.  Cardiovascular: Normal rate  and regular rhythm.  Respiratory: Effort normal, symmetric excursions bilaterally, no audible wheezing. GI: Soft.  No distension. There is no tenderness.  Skin: WDI  Neuro: Mental Status: Patient is awake, alert, oriented to person, place, situation. Patient is not able to give a clear and coherent history. Speech significantly impaired fluency, intact comprehension and impaired repetition. No signs neglect. Visual Fields are full. Pupils are equal, round, and reactive to light. EOMI without ptosis or diplopia.  Facial sensation is decreased to temperature on right Facial mild left lower face weakness.  Hearing is intact to voice. Uvula midline and palate elevates symmetrically. Shoulder shrug is symmetric. Tongue is midline without atrophy or fasciculations.  Tone is normal. Bulk is normal. 4/5 strength in right extremities. Sensation is decreased to temperature in the right arms and legs. Deep Tendon Reflexes: 2+ and symmetric in the biceps and patellae. Babinski (+) R FNF and HKS are intact bilaterally. Gait - Deferred  I have reviewed labs in epic and the pertinent results are: Troponin 2671  I have reviewed the images obtained: NCT head showed no acute no evidence of acute intracranial abnormality.  ASPECTS is 10. CTA head and neck showed occlusion of a proximal left M2 MCA branch. On  CT perfusion, 19 mL area of increased T-max time in the left MCA territory, correlating with the above occlusion and concerning for tissue at risk. Approximately 80% left and 50% right ICA origin stenosis in the neck. Apparent increased size of lucent lesions involving the C7 vertebral body relative to CT chest from February 14, 23. Given the apparent increase in size and patient's known malignancy, findings are concerning for osseous metastasis, but incompletely characterized on this study. An MRI with contrast or PET-CT could further evaluate if clinically warranted. Mass-like consolidation in the visualized left upper lobe, which is similar in size and was better characterized on prior CT chest from 02/27/2022. Aortic Atherosclerosis (ICD10-I70.0) and Emphysema (ICD10-J43.9).  Assessment: PAISLYN DOMENICO is a 76 y.o. female PMHx lung cancer and liver lesions suspicious for metastatic cancer on apixaban found to have occlusion of proximal left M2. She was not a candidate for tNK due to apixaban and active cancer. Discussed patient's functional status with daughter and the patient has mRS 4-5. Based on functional status, she was not a candidate for thrombectomy and discussed the case with NeuroIR, patient and family.  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 On apixaban. NIHSS 8. Right sided weakness. Suspicious metastatic lesions to liver and bone.  Plan: - MRI brain without contrast. - Recommend TTE. - Recommend labs: HbA1c, lipid panel. - Recommend Statin for goal LDL <70. - Goal A1c <7. - Continue anticoagulation with heparin which is rapidly reversible for now. - Permissive hypertension SBP<180 - Patient is currently normotensive.  - Telemetry monitoring for arrhythmia. - Recommend bedside Swallow screen. - Recommend Stroke education. - Recommend PT/OT/SLP consult.  This patient is critically ill and  at significant risk of neurological worsening, death and care requires constant monitoring of vital signs, hemodynamics,respiratory and cardiac monitoring, neurological assessment, discussion with family, other specialists and medical decision making of high complexity. I spent 75 minutes of neurocritical care time  in the care of  this patient. This was time spent independent of any time provided by nurse practitioner or PA.  Electronically signed by:  Lynnae Sandhoff, MD Page: [phone] 03/11/2022, 11:42 AM  If 7pm- 7am, please page neurology on call as  listed in [place].

## 2022-03-12 NOTE — Progress Notes (Signed)
ANTICOAGULATION CONSULT NOTE - Initial Consult  Pharmacy Consult for heparin Indication: chest pain/ACS  Allergies  Allergen Reactions   Amlodipine Besylate Swelling    Edema   Codeine Nausea And Vomiting   Oxycodone-Acetaminophen Nausea And Vomiting    Patient Measurements: Weight: 55.6 kg (122 lb 9.2 oz) Heparin Dosing Weight: TBW  Vital Signs: Temp: 98.1 F (36.7 C) (05/27 1212) Temp Source: Oral (05/27 1212) BP: 126/56 (05/27 1730) Pulse Rate: 84 (05/27 1730)  Labs: Recent Labs    02/19/2022 1128 02/22/2022 1132 02/21/2022 1457  HGB 8.7* 9.9*  --   HCT 29.7* 29.0*  --   PLT 120*  --   --   APTT 37*  --   --   LABPROT 18.0*  --   --   INR 1.5*  --   --   CREATININE 0.86 0.90  --   TROPONINIHS 2,671*  --  1,865*    Estimated Creatinine Clearance: 40.9 mL/min (by C-G formula based on SCr of 0.9 mg/dL).   Medical History: Past Medical History:  Diagnosis Date   Anemia    blood transfusion November 2022   Anxiety    Asthma    Basal cell carcinoma    lung cancer.  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 Uses 2 L    Foot swelling    left- has shown to Drs.   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  Elevated troponin, to start heparin  Goal of Therapy:   Heparin level 0.3-0.5 units/ml Monitor platelets by anticoagulation protocol: Yes   Plan:  Heparin gtt at 700 units/hr, no bolus F/u 8 hour aPTT/heparin level  Bertis Ruddy, PharmD Clinical Pharmacist ED Pharmacist Phone # [phone] 02/21/2022 5:58 PM

## 2022-03-12 NOTE — Code Documentation (Signed)
Rapid Response Nurse Documentation Code Documentation  Holly [name] is a 76 y.o. female arriving to [facility]  via [facility] on 02/14/2022 with past medical hx of anemia, basal cell carcinoma, COPD, melanoma, HTN, HLD, osteomyelitis, sleep apnea, non-small cell lung cancer. On Eliquis (apixaban) daily. Code stroke was activated by EMS.   Patient from home where she was LKW at 03/07/22 and unknown time and now complaining of expressive aphasia, right sided facial assymetry. Her daughter had reached out to the patient's PCP regarding her mother's symptoms and she was told to remove the patient's Fentanyl patch to see if the symptoms resolved. The daugher called EMS and she was told the same by them. The symptoms had not resolved so she called EMS and her mother was brought in for further evaluation  Stroke team at the bedside on patient arrival. Labs drawn and patient cleared for CT by Dr. Dina Rich. Patient to CT with team. NIHSS 8, see documentation for details and code stroke times. Patient with right facial droop, left decreased sensation, Expressive aphasia , and dysarthria  on exam. The following imaging was completed:  CT Head, CTA, and CTP. Patient is not a candidate for IV Thrombolytic due to her taking a DOAC anticoagulant.   Care Plan: VS/Adrian q2H x 12 hours then q4 hour.   Bedside handoff with ED RN Chloe.    [place]  Rapid Response RN

## 2022-03-12 NOTE — ED Notes (Signed)
Pt's BP is low in the 90s. MD made aware.

## 2022-03-13 ENCOUNTER — Inpatient Hospital Stay (HOSPITAL_COMMUNITY): Payer: Medicare Other

## 2022-03-13 DIAGNOSIS — I639 Cerebral infarction, unspecified: Secondary | ICD-10-CM

## 2022-03-13 DIAGNOSIS — I3139 Other pericardial effusion (noninflammatory): Secondary | ICD-10-CM

## 2022-03-13 DIAGNOSIS — I214 Non-ST elevation (NSTEMI) myocardial infarction: Secondary | ICD-10-CM

## 2022-03-13 DIAGNOSIS — N189 Chronic kidney disease, unspecified: Secondary | ICD-10-CM | POA: Diagnosis not present

## 2022-03-13 DIAGNOSIS — S22000A Wedge compression fracture of unspecified thoracic vertebra, initial encounter for closed fracture: Secondary | ICD-10-CM | POA: Diagnosis not present

## 2022-03-13 DIAGNOSIS — R778 Other specified abnormalities of plasma proteins: Secondary | ICD-10-CM | POA: Diagnosis not present

## 2022-03-13 DIAGNOSIS — Z86718 Personal history of other venous thrombosis and embolism: Secondary | ICD-10-CM

## 2022-03-13 DIAGNOSIS — Z933 Colostomy status: Secondary | ICD-10-CM | POA: Diagnosis not present

## 2022-03-13 DIAGNOSIS — I82452 Acute embolism and thrombosis of left peroneal vein: Secondary | ICD-10-CM

## 2022-03-13 DIAGNOSIS — I309 Acute pericarditis, unspecified: Secondary | ICD-10-CM | POA: Diagnosis not present

## 2022-03-13 DIAGNOSIS — I6340 Cerebral infarction due to embolism of unspecified cerebral artery: Secondary | ICD-10-CM | POA: Diagnosis not present

## 2022-03-13 DIAGNOSIS — I6389 Other cerebral infarction: Secondary | ICD-10-CM | POA: Diagnosis not present

## 2022-03-13 DIAGNOSIS — I63412 Cerebral infarction due to embolism of left middle cerebral artery: Secondary | ICD-10-CM | POA: Diagnosis not present

## 2022-03-13 DIAGNOSIS — J449 Chronic obstructive pulmonary disease, unspecified: Secondary | ICD-10-CM | POA: Diagnosis not present

## 2022-03-13 DIAGNOSIS — D72829 Elevated white blood cell count, unspecified: Secondary | ICD-10-CM | POA: Diagnosis not present

## 2022-03-13 DIAGNOSIS — J9611 Chronic respiratory failure with hypoxia: Secondary | ICD-10-CM | POA: Diagnosis not present

## 2022-03-13 LAB — ECHOCARDIOGRAM COMPLETE
AV Mean grad: 3.3 mmHg
AV Peak grad: 6.6 mmHg
Ao pk vel: 1.28 m/s
Area-P 1/2: 4.49 cm2
Calc EF: 45.8 %
Height: 65 in
S' Lateral: 2.3 cm
Single Plane A2C EF: 20.1 %
Single Plane A4C EF: 66 %
Weight: 1961.21 oz

## 2022-03-13 LAB — CBC
HCT: 28 % — ABNORMAL LOW (ref 36.0–46.0)
Hemoglobin: 8.1 g/dL — ABNORMAL LOW (ref 12.0–15.0)
MCH: 27.3 pg (ref 26.0–34.0)
MCHC: 28.9 g/dL — ABNORMAL LOW (ref 30.0–36.0)
MCV: 94.3 fL (ref 80.0–100.0)
Platelets: 134 10*3/uL — ABNORMAL LOW (ref 150–400)
RBC: 2.97 MIL/uL — ABNORMAL LOW (ref 3.87–5.11)
RDW: 17.6 % — ABNORMAL HIGH (ref 11.5–15.5)
WBC: 11.3 10*3/uL — ABNORMAL HIGH (ref 4.0–10.5)
nRBC: 0 % (ref 0.0–0.2)

## 2022-03-13 LAB — BASIC METABOLIC PANEL
Anion gap: 7 (ref 5–15)
BUN: 13 mg/dL (ref 8–23)
CO2: 32 mmol/L (ref 22–32)
Calcium: 9.8 mg/dL (ref 8.9–10.3)
Chloride: 102 mmol/L (ref 98–111)
Creatinine, Ser: 0.76 mg/dL (ref 0.44–1.00)
GFR, Estimated: >60 mL/min (ref >60)
Glucose, Bld: 90 mg/dL (ref 70–99)
Potassium: 3.6 mmol/L (ref 3.5–5.1)
Sodium: 141 mmol/L (ref 135–145)

## 2022-03-13 LAB — TROPONIN I (HIGH SENSITIVITY): Troponin I (High Sensitivity): 1977 ng/L (ref <18)

## 2022-03-13 LAB — HEPARIN LEVEL (UNFRACTIONATED): Heparin Unfractionated: >1.1 IU/mL — ABNORMAL HIGH (ref 0.30–0.70)

## 2022-03-13 LAB — APTT
aPTT: 49 seconds — ABNORMAL HIGH (ref 24–36)
aPTT: 83 seconds — ABNORMAL HIGH (ref 24–36)

## 2022-03-13 IMAGING — MR MR HEAD W/O CM
10 of 11 series · 42 of 48 positions shown · non-contrast
Comparison: Head CT and CTA from yesterday

CLINICAL DATA: Weakness

EXAM:
MRI HEAD WITHOUT CONTRAST
TECHNIQUE: Multiplanar, multiecho pulse sequences of the brain and surrounding
structures were obtained without intravenous contrast.

[Series 5: DWI · axial · 3.0mm · 0.88mm/px · z∈[-86,+54]mm · 9 of 96 slices shown (1 of 4)]
[im 1/96]
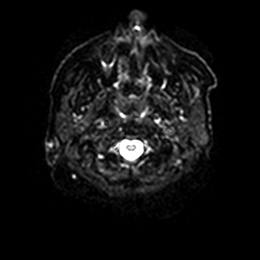
[im 12/96]
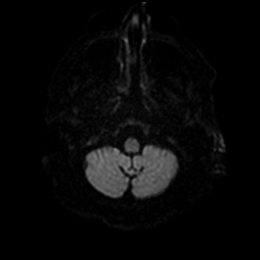
[im 24/96]
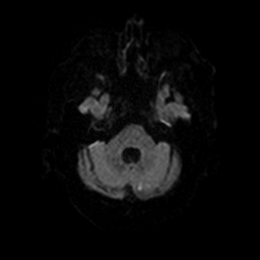
[im 36/96]
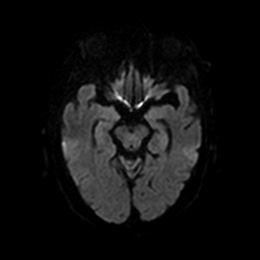
[im 48/96]
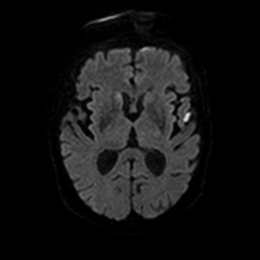
[im 60/96]
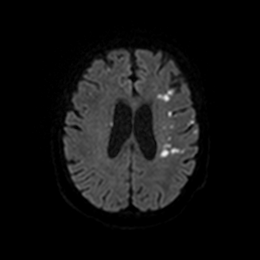
[im 72/96]
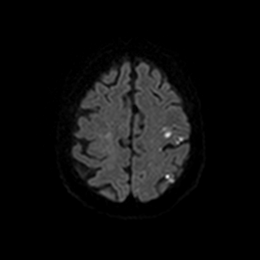
[im 84/96]
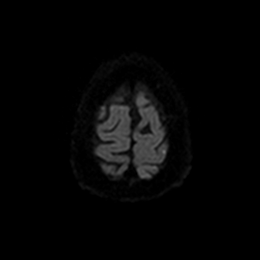
[im 96/96]
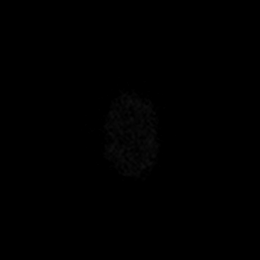

[Series 6: DWI · axial · 3.0mm · 0.88mm/px · z∈[-86,+54]mm · 4 of 48 slices shown (2 of 4)]
[im 1/48]
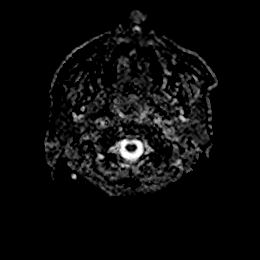
[im 16/48]
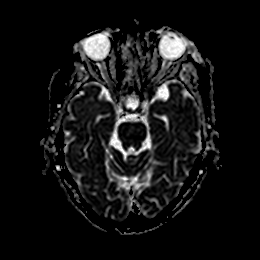
[im 32/48]
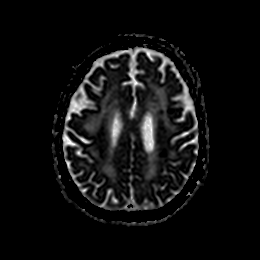
[im 48/48]
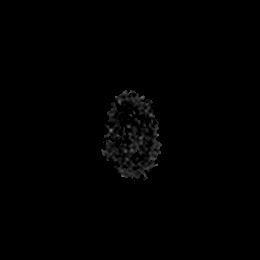

[Series 7: DWI · coronal · 4.0mm · 0.88mm/px · 6 of 64 slices shown (3 of 4)]
[im 1/64]
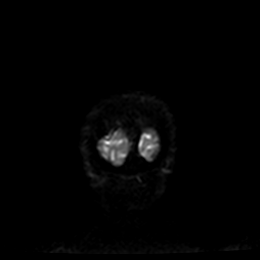
[im 13/64]
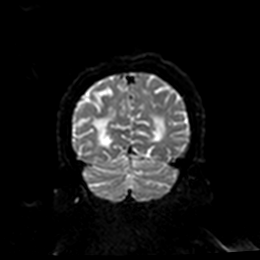
[im 26/64]
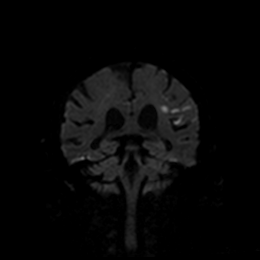
[im 38/64]
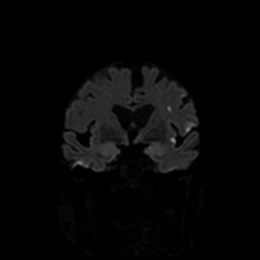
[im 51/64]
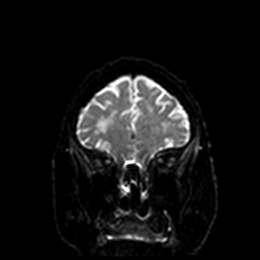
[im 64/64]
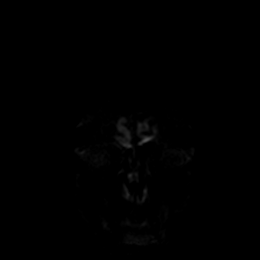

[Series 8: DWI · coronal · 4.0mm · 0.88mm/px · 3 of 32 slices shown (4 of 4)]
[im 1/32]
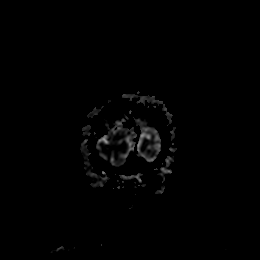
[im 16/32]
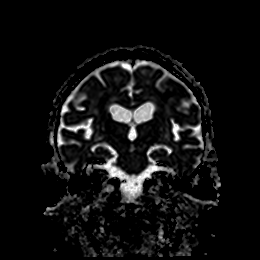
[im 32/32]
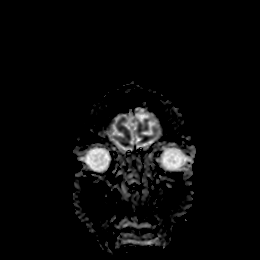

[Series 9: T1 · sagittal · 5.0mm · 0.75mm/px · 2 of 23 slices shown]
[im 1/23]
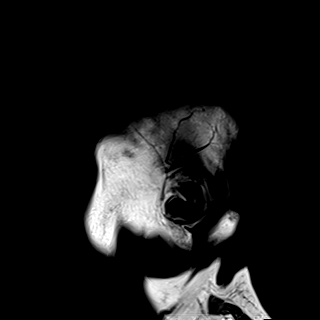
[im 23/23]
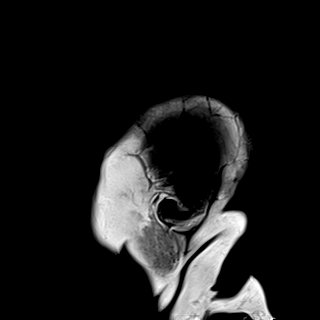

[Series 10: T2 · axial · 5.0mm · 0.72mm/px · z∈[-87,+55]mm · 2 of 25 slices shown (1 of 2)]
[im 1/25]
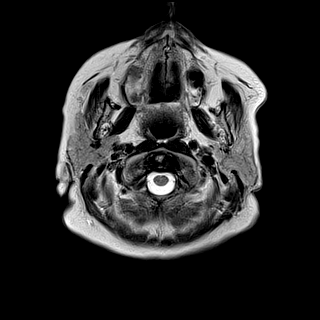
[im 25/25]
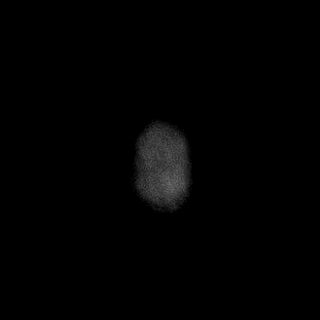

[Series 11: FLAIR · axial · 5.0mm · 0.45mm/px · z∈[-88,+54]mm · 2 of 25 slices shown]
[im 1/25]
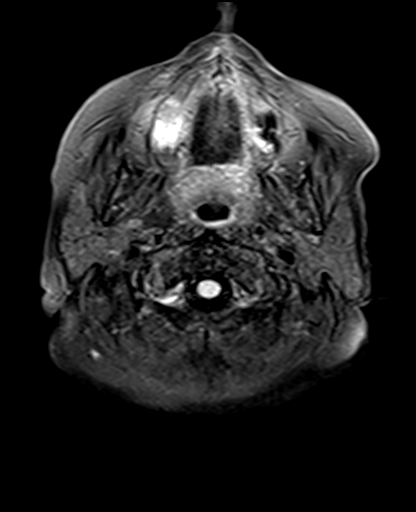
[im 25/25]
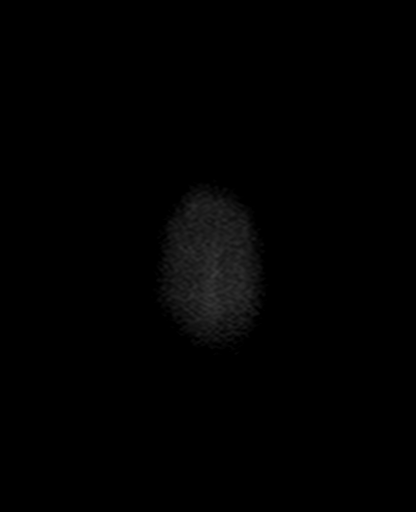

[Series 13: pha_images · axial · 3.0mm · 0.90mm/px · z∈[-105,+67]mm · 5 of 59 slices shown]
[im 1/59]
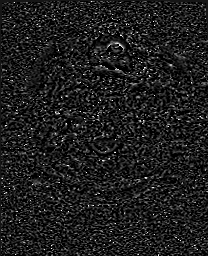
[im 15/59]
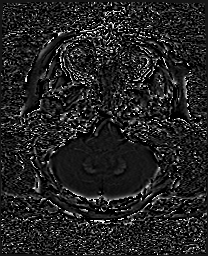
[im 30/59]
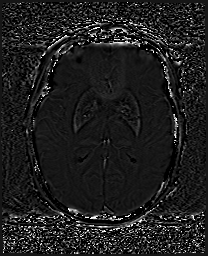
[im 44/59]
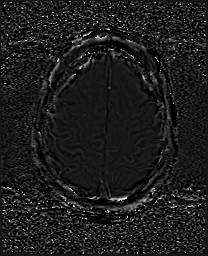
[im 59/59]
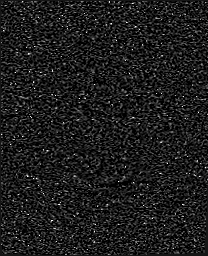

[Series 14: swi_images · axial · 3.0mm · 0.90mm/px · z∈[-105,+70]mm · 6 of 60 slices shown]
[im 1/60]
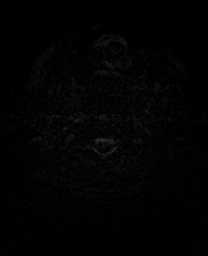
[im 12/60]
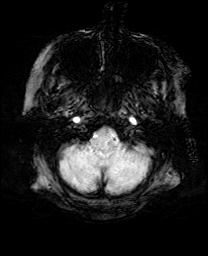
[im 24/60]
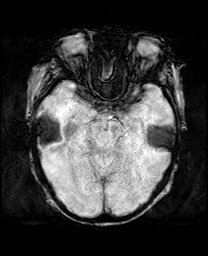
[im 36/60]
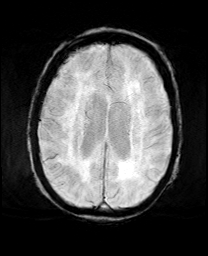
[im 48/60]
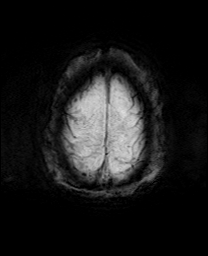
[im 60/60]
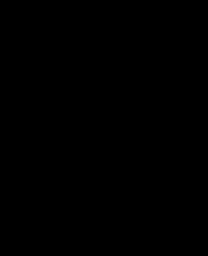

[Series 17: T2 · coronal · 5.0mm · 0.34mm/px · 3 of 29 slices shown (2 of 2)]
[im 1/29]
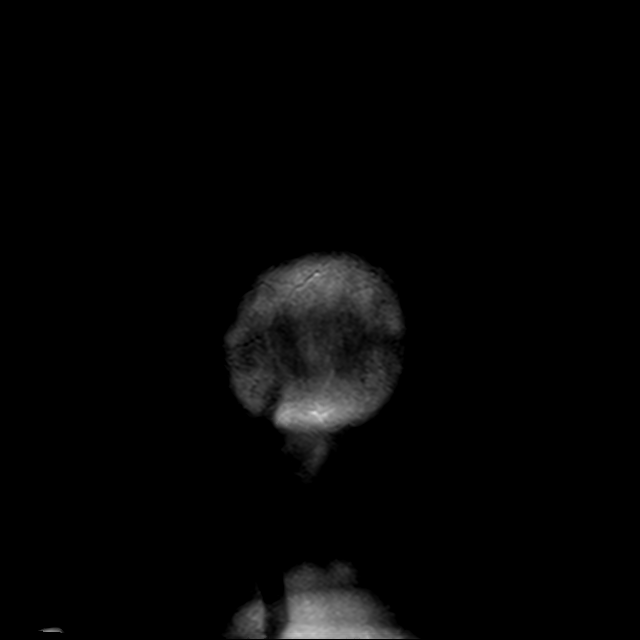
[im 15/29]
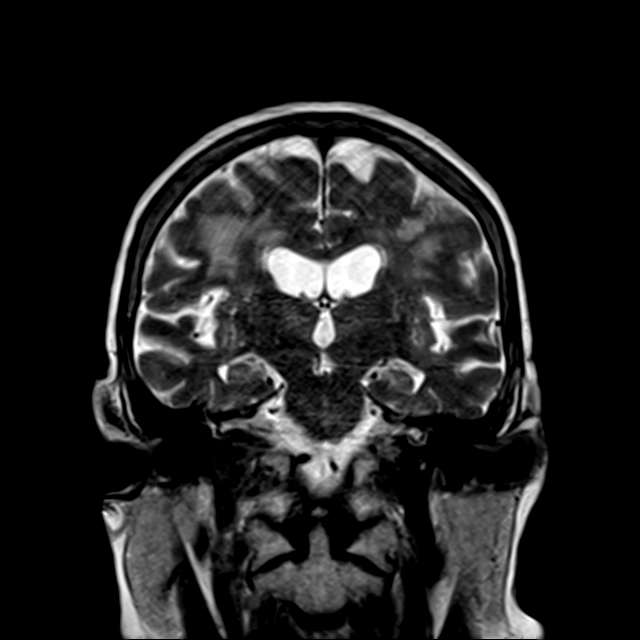
[im 29/29]
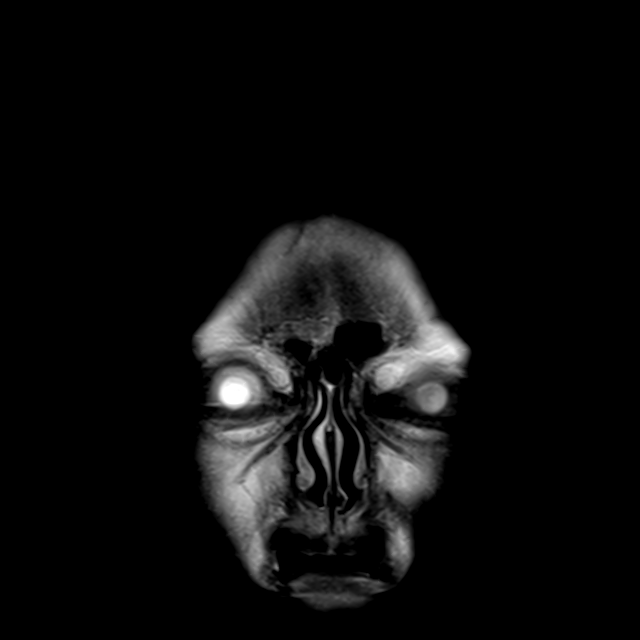

[42 of 48 positions shown; findings below may reference images not displayed]

FINDINGS: Brain: Small acute infarcts in the bilateral cerebellum and left
more than right cerebral convexity, most extensive in the left MCA
territory along the frontal and parietal cortex. Mild patchy white
matter involvement and involvement at the right caudate body.

Confluent chronic small vessel ischemic gliosis in the cerebral
white matter. Age congruent brain volume. No acute hemorrhage,
hydrocephalus, or masslike finding.

Vascular: Major flow voids are preserved

Skull and upper cervical spine: No focal marrow lesion.

Sinuses/Orbits: Right cataract resection
IMPRESSION: 1. Small scattered acute infarcts in multiple vascular distributions
suggesting central embolic disease. Greatest involvement is in the
left MCA distribution.
2. Advanced chronic small vessel ischemia.

## 2022-03-13 MED ORDER — BIOTENE DRY MOUTH MT LIQD: 15.0000 mL | OROMUCOSAL | Status: DC | PRN

## 2022-03-13 MED ORDER — LORAZEPAM 2 MG/ML PO CONC: 1.0000 mg | ORAL | Status: DC | PRN

## 2022-03-13 MED ORDER — LORAZEPAM 1 MG PO TABS: 1.0000 mg | ORAL_TABLET | ORAL | Status: DC | PRN

## 2022-03-13 MED ORDER — OXYCODONE HCL 5 MG PO TABS
5.0000 mg | ORAL_TABLET | ORAL | Status: DC | PRN
Start: 2022-03-13 — End: 2022-03-13

## 2022-03-13 MED ORDER — LORAZEPAM 2 MG/ML IJ SOLN
1.0000 mg | INTRAMUSCULAR | Status: DC | PRN
  Administered 2022-03-13 (×2): 1 mg via INTRAVENOUS
  Filled 2022-03-13 (×2): qty 1

## 2022-03-13 MED ORDER — GLYCOPYRROLATE 0.2 MG/ML IJ SOLN
0.2000 mg | INTRAMUSCULAR | Status: DC | PRN
  Administered 2022-03-13: 0.2 mg via SUBCUTANEOUS
  Filled 2022-03-13: qty 1

## 2022-03-13 MED ORDER — ALPRAZOLAM 0.5 MG PO TABS
0.5000 mg | ORAL_TABLET | ORAL | Status: DC
  Administered 2022-03-13 (×2): 0.5 mg via ORAL
  Filled 2022-03-13 (×2): qty 1

## 2022-03-13 MED ORDER — ONDANSETRON HCL 4 MG/2ML IJ SOLN: 4.0000 mg | Freq: Four times a day (QID) | INTRAMUSCULAR | Status: DC | PRN

## 2022-03-13 MED ORDER — GLYCOPYRROLATE 1 MG PO TABS: 1.0000 mg | ORAL_TABLET | ORAL | Status: DC | PRN

## 2022-03-13 MED ORDER — HYDROMORPHONE HCL 1 MG/ML IJ SOLN: 0.5000 mg | INTRAMUSCULAR | Status: DC | PRN

## 2022-03-13 MED ORDER — ONDANSETRON 4 MG PO TBDP: 4.0000 mg | ORAL_TABLET | Freq: Four times a day (QID) | ORAL | Status: DC | PRN

## 2022-03-13 MED ORDER — HALOPERIDOL LACTATE 2 MG/ML PO CONC: 0.5000 mg | ORAL | Status: DC | PRN

## 2022-03-13 MED ORDER — POLYVINYL ALCOHOL 1.4 % OP SOLN: 1.0000 [drp] | Freq: Four times a day (QID) | OPHTHALMIC | Status: DC | PRN

## 2022-03-13 MED ORDER — HALOPERIDOL 0.5 MG PO TABS: 0.5000 mg | ORAL_TABLET | ORAL | Status: DC | PRN

## 2022-03-13 MED ORDER — GLYCOPYRROLATE 0.2 MG/ML IJ SOLN: 0.2000 mg | INTRAMUSCULAR | Status: DC | PRN

## 2022-03-13 MED ORDER — FLUTICASONE FUROATE-VILANTEROL 100-25 MCG/ACT IN AEPB
1.0000 | INHALATION_SPRAY | Freq: Every day | RESPIRATORY_TRACT | Status: DC
  Administered 2022-03-13: 1 via RESPIRATORY_TRACT
  Filled 2022-03-13: qty 28

## 2022-03-13 MED ORDER — FOSFOMYCIN TROMETHAMINE 3 G PO PACK
3.0000 g | PACK | Freq: Once | ORAL | Status: DC
  Filled 2022-03-13: qty 3

## 2022-03-13 MED ORDER — HALOPERIDOL LACTATE 5 MG/ML IJ SOLN
0.5000 mg | INTRAMUSCULAR | Status: DC | PRN
Start: 2022-03-13 — End: 2022-03-15

## 2022-03-13 MED ORDER — COLLAGENASE 250 UNIT/GM EX OINT
TOPICAL_OINTMENT | Freq: Every day | CUTANEOUS | Status: DC
  Filled 2022-03-13: qty 30

## 2022-03-13 MED ORDER — LACTATED RINGERS IV BOLUS
500.0000 mL | Freq: Once | INTRAVENOUS | Status: AC
  Administered 2022-03-13: 500 mL via INTRAVENOUS

## 2022-03-13 MED ORDER — UMECLIDINIUM BROMIDE 62.5 MCG/ACT IN AEPB
1.0000 | INHALATION_SPRAY | Freq: Every day | RESPIRATORY_TRACT | Status: DC
  Filled 2022-03-13: qty 7

## 2022-03-13 MED ORDER — HYDROMORPHONE HCL 1 MG/ML PO LIQD
1.0000 mg | ORAL | Status: DC
  Administered 2022-03-13 – 2022-03-14 (×4): 1 mg via ORAL
  Filled 2022-03-13 (×4): qty 1

## 2022-03-13 MED ORDER — DIPHENHYDRAMINE HCL 50 MG/ML IJ SOLN: 12.5000 mg | INTRAMUSCULAR | Status: DC | PRN

## 2022-03-13 MED ORDER — HYDROMORPHONE HCL 1 MG/ML IJ SOLN
0.5000 mg | INTRAMUSCULAR | Status: DC | PRN
  Administered 2022-03-13: 0.5 mg via INTRAVENOUS
  Filled 2022-03-13: qty 0.5

## 2022-03-13 NOTE — Progress Notes (Signed)
ANTICOAGULATION CONSULT NOTE - Follow Up Consult  Pharmacy Consult for heparin Indication:  ACS in setting of acute embolic stroke despite Eliquis therapy for recent DVT  Labs: Recent Labs    03/03/2022 1128 03/05/2022 1132 02/19/2022 1457 03/13/22 0217 03/13/22 0925  HGB 8.7* 9.9*  --  8.1*  --   HCT 29.7* 29.0*  --  28.0*  --   PLT 120*  --   --  134*  --   APTT 37*  --   --  49*  --   LABPROT 18.0*  --   --   --   --   INR 1.5*  --   --   --   --   HEPARINUNFRC  --   --   --  >1.10*  --   CREATININE 0.86 0.90  --  0.76  --   TROPONINIHS 2,671*  --  1,865*  --  1,977*     Assessment: 76yo female subtherapeutic on heparin with initial dosing for ACS in setting of embolic stroke despite Eliquis for DVT; RN last saw that IV was patent ~0030, upon recheck she noted that IV was extravasated with new bruises on arm.  5/28 update: therapeutic APTT 85, spoke with RN- extravasation site still present, swelling decreased but not as localized in area. No signs or symptoms of bleeding. Will check a re-check confirmatory level in 8 hours.   Goal of Therapy:  aPTT 66-85 seconds   Plan:  Continue heparin infusion at 700 units/hr Re-check in 8 hours RN aware to watch for further issues Monitor daily heparin level and CBC  Lestine Box, PharmD PGY2 Infectious Diseases Pharmacy Resident   Please check [email or web address] for unit-specific pharmacy phone numbers

## 2022-03-13 NOTE — Progress Notes (Addendum)
Progress Note  Patient: Holly [name] FAO:[id number] DOB: [birth year]  DOA: 03/13/2022  DOS: 03/13/2022    Brief hospital course: Holly [name] is a 76 y.o. 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 Also noted with NSTEMI with troponin of 2671 for which eliquis was changed to heparin infusion. Subsequent echocardiogram revealed a large pericardial effusion for which anticoagulation was stopped. Palliative pericardial drain was considered, though with continued goals of care discussions, the patient, supported by family, has opted to transition toward hospice.  Assessment and Plan: Pericardial effusion: With RV diastolic collapse and worsening tachycardia suggesting possible impending tamponade, though IVC not dilated on echo, no JVD on exam as of this morning. DDx includes hemorrhagic and malignant. No effusion on CT images earlier this year, though mildly enlarged cardiac silhouette last night. - Stopped anticoagulation.  - Pt declines drain placement/ICU transfer for closer monitoring. Will not continue with plans for serial echocardiography. Monitor vital signs less often due to comfort focus.   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 Poor prognosis. Comfort measures as discussed below.  NSTEMI: No further work up or treatment planned at this time. Tx symptomatically  MRSA osteomyelitis: Continue doxycycline as long as reasonably tolerating po.   Left peroneal acute DVT: Despite DOAC. No AC as  discussed.  HTN  Hyperlipidemia  Former tobacco use  OSA: Continue CPAP  SIRS, concern for UTI: Tx with fosfomycin x1.   Chronic hypoxic respiratory failure: Continue supplemental oxygen.  Unstageable sacral pressure injury, POA:  - Air mattress for comfort, offload if desired by pt. Can continue wound care if desired by pt.   Goals of care counseling, DNR present on admission:  - Holly [name] already had oxygen-dependent COPD and multiple cancers, having recently been scheduled for a PET-CT to investigate spots on the liver concerning for metastases. She also has an enlarging osseous lesion at C7 noted on CTA H/N here concerning for metastasis. Then, in February, she suffered osteomyelitis causing tibial plateau fracture. The following month brought her a colon perforation requiring urgent laparotomy, colectomy, and colostomy followed by DVT associated with PICC line for IV antibiotics for the MRSA. Her daughter also tells me she's been essentially bedbound and, in deed, she now has a sacral decubitus ulcer. She presents now with large vessel occlusion of the M2 branch of the left MCA as well as scattered bilateral small acute infarcts suggestive of embolic disease which has developed despite adherence to DOAC. Also has new left femoral DVT and NSTEMI. Presumably this is from malignancy-associated hypercoagulability.  We discussed the concepts of hospice care as they may apply to her current situation this morning. Given her NSTEMI, we were awaiting an echo at that time, and she reported some shortness of breath. A large pericardial effusion has developed with signs suggesting development of tamponade could be imminent. We now have to stop anticoagulation. I returned to the bedside to discuss this with the patient and her son, daughter, and best friend. She has only heard bad news and wants to be made comfortable. She had already decided against pursuing pericardial drainage.  Given the  impossible clinical situation we're in acutely and the progressive functional deterioration over the past couple months brought on by MRSA osteomyelitis, perforated sigmoid diverticulitis, and DVT, the patient has made clear that she would like to stop aggressive interventions and be made comfortable. Mainly, we will give medications for anxiety, dyspnea, and pain as needed. We will stop continuous monitoring and, since it will not change management, will forego serial echocardiograms.   If the pericardial effusion progresses to tamponade, no escalation of care is desired by the patient. This would put life expectancy in hours. Otherwise, unless she develops ACS or recurrent stroke, her prognosis could be longer. We will monitor her through the day today. I've asked palliative care for assistance.   Subjective: She had bleeding at the right arm PIV site with heparin gtt and large hematoma/ecchymosis which is stable currently. Short of breath even at rest, no chest pain. Does have some back pain that is stable. Still can't really speak words very much but has complete comprehension and can shake/nod head in response. Was unable to write or use a keyboard either. No new deficits.  Objective: Vitals:   03/13/22 0840 03/13/22 0845 03/13/22 1136 03/13/22 1336  BP:   (!) 113/59 (!) 116/91  Pulse:   (!) 108 (!) 108  Resp:   (!) 26 (!) 23  Temp:      TempSrc:      SpO2: 100% 100% 100% 94%  Weight:      Height:       Gen: Frail elderly female in no distress Pulm: Nonlabored tachypnea without crackles or wheezes. CV: Regular without murmur, rub, or gallop. Distant. No JVD, no pitting dependent edema. GI: Abdomen soft, non-tender, non-distended, with normoactive bowel sounds.  Ext: Warm, dry, deformities Skin: No new rashes, lesions or ulcers on visualized skin. Sacrum not examined this AM (pictured in H&P). There is significant right arm ecchymosis that is stable, no active bleeding there. Some minor  stable blood under tegaderm on left AC PIV.  Neuro: Alert, appears oriented but aphasic. Diminished strength diffusely R > L, facial droop on right. Psych: Judgement and insight appear adequate.    Data Personally reviewed: CBC: Recent Labs  Lab 03/08/22 1510 02/24/2022 1128 02/21/2022 1132 03/13/22 0217  WBC 13.0* 12.0*  --  11.3*  NEUTROABS 10.9* 9.1*  --   --   HGB 9.0* 8.7* 9.9* 8.1*  HCT 31.2* 29.7* 29.0* 28.0*  MCV 94.8 95.2  --  94.3  PLT 114* 120*  --  010*   Basic Metabolic Panel: Recent Labs  Lab 03/08/22 1510 03/11/2022 1128 03/09/2022 1132 03/13/22 0217  NA 142 137 137 141  K 4.8 4.0 3.9 3.6  CL 104 97* 96* 102  CO2 33* 32  --  32  GLUCOSE 188* 165* 161* 90  BUN 32* 17 19 13   CREATININE 0.87 0.86 0.90 0.76  CALCIUM 9.7 9.8  --  9.8   GFR: Estimated Creatinine Clearance: 51.7 mL/min (by C-G formula based on SCr of 0.76 mg/dL). Liver Function Tests: Recent Labs  Lab 03/08/22 1510 03/11/2022 1128  AST 33 36  ALT 17 21  ALKPHOS 199* 179*  BILITOT 0.3 0.6  PROT 6.1* 5.9*  ALBUMIN 2.8* 2.5*   No results for input(s): LIPASE, AMYLASE in the last 168 hours. No results for input(s): AMMONIA in the last 168 hours. Coagulation Profile: Recent Labs  Lab 02/20/2022 1128  INR 1.5*   Cardiac Enzymes: No results for input(s): CKTOTAL, CKMB,  CKMBINDEX, TROPONINI in the last 168 hours. BNP (last 3 results) No results for input(s): PROBNP in the last 8760 hours. HbA1C: Recent Labs    03/05/2022 1959  HGBA1C 5.1   CBG: Recent Labs  Lab 03/05/2022 1128  GLUCAP 153*   Lipid Profile: Recent Labs    03/13/2022 1959  CHOL 137  HDL 43  LDLCALC 61  TRIG 166*  CHOLHDL 3.2   Thyroid Function Tests: Recent Labs    02/19/2022 1959  TSH 1.756   Anemia Panel: No results for input(s): VITAMINB12, FOLATE, FERRITIN, TIBC, IRON, RETICCTPCT in the last 72 hours. Urine analysis:    Component Value Date/Time   COLORURINE YELLOW 01/13/2022 1406   APPEARANCEUR CLEAR  01/13/2022 1406   LABSPEC 1.012 01/13/2022 1406   PHURINE 7.0 01/13/2022 1406   GLUCOSEU NEGATIVE 01/13/2022 1406   [place] 01/13/2022 1406   [place] 01/13/2022 1406   [place] 01/13/2022 1406   PROTEINUR NEGATIVE 01/13/2022 1406   NITRITE NEGATIVE 01/13/2022 1406   LEUKOCYTESUR SMALL (A) 01/13/2022 1406   No results found for this or any previous visit (from the past 240 hour(s)).   MR BRAIN WO CONTRAST  Result Date: 03/13/2022 CLINICAL DATA:  Weakness EXAM: MRI HEAD WITHOUT CONTRAST TECHNIQUE: Multiplanar, multiecho pulse sequences of the brain and surrounding structures were obtained without intravenous contrast. COMPARISON:  Head CT and CTA from yesterday FINDINGS: Brain: Small acute infarcts in the bilateral cerebellum and left more than right cerebral convexity, most extensive in the left MCA territory along the frontal and parietal cortex. Mild patchy white matter involvement and involvement at the right caudate body. Confluent chronic small vessel ischemic gliosis in the cerebral white matter. Age congruent brain volume. No acute hemorrhage, hydrocephalus, or masslike finding. Vascular: Major flow voids are preserved Skull and upper cervical spine: No focal marrow lesion. Sinuses/Orbits: Right cataract resection IMPRESSION: 1. Small scattered acute infarcts in multiple vascular distributions suggesting central embolic disease. Greatest involvement is in the left MCA distribution. 2. Advanced chronic small vessel ischemia. Electronically Signed   By: Jorje Guild M.D.   On: 03/13/2022 05:06   DG Chest Port 1 View  Result Date: 02/20/2022 CLINICAL DATA:  Weakness. Stroke. Non-small-cell lung cancer treated with chemotherapy and radiation therapy. EXAM: PORTABLE CHEST 1 VIEW COMPARISON:  08/28/2021.  Chest CT dated 11/30/2021. FINDINGS: Mildly enlarged cardiac silhouette, accentuated above prominent pericardial and epicardial fat on the previous CT. Mild patchy  and linear opacity in the left upper lobe, similar to the previous CT. The previously demonstrated right middle lobe mass is currently visualized. No pleural fluid. Mildly tortuous and calcified thoracic aorta. Mild scoliosis. IMPRESSION: No acute abnormality. Grossly stable areas of consolidation in the left upper lobe compared to the previous CT dated 11/30/2021 and described in more detail in that report. Electronically Signed   By: Claudie Revering M.D.   On: 02/19/2022 13:31   ECHOCARDIOGRAM COMPLETE  Result Date: 03/13/2022    ECHOCARDIOGRAM REPORT   Patient Name:   [facility] [place] A[place] Date of Exam: 03/13/2022 Medical Rec #:  [id number]     Height:       65.0 in Accession #:    [id number]    Weight:       122.6 lb Date of Birth:  1946/07/11     BSA:          1.607 m Patient Age:    64 years      BP:  151/82 mmHg Patient Gender: F             HR:           104 bpm. Exam Location:  Inpatient Procedure: 2D Echo, Cardiac Doppler, Color Doppler and Strain Analysis Indications:    CVA  History:        Patient has prior history of Echocardiogram examinations, most                 recent 07/25/2021. COPD, Signs/Symptoms:Dyspnea; Risk                 Factors:Hypertension.  Sonographer:    [place] Referring Phys: [id number] [place]  1. Left ventricular ejection fraction, by estimation, is 60 to 65%. The left ventricle has normal function. The left ventricle has no regional wall motion abnormalities. Left ventricular diastolic parameters are consistent with Grade I diastolic dysfunction (impaired relaxation).  2. Right ventricular systolic function is normal. The right ventricular size is normal.  3. Small to moderate luscent circumferential effusion with large rind of likely hemorrhagic pericaridal material and RV diastolic collapse Given history of DOAC use and malignant cancer likely blood Not epicardial fat as this was not present on TTE done  in October Dr Johney Frame aware Not clear that  she is a candiate for pericardial window and effusion cannot be cleared percutaneously . Large pericardial effusion. The pericardial effusion is circumferential. Findings are consistent with cardiac tamponade.  4. The mitral valve is normal in structure. No evidence of mitral valve regurgitation. No evidence of mitral stenosis.  5. The aortic valve was not well visualized. There is mild calcification of the aortic valve. There is mild thickening of the aortic valve. Aortic valve regurgitation is not visualized. Aortic valve sclerosis is present, with no evidence of aortic valve  stenosis.  6. The inferior vena cava is normal in size with greater than 50% respiratory variability, suggesting right atrial pressure of 3 mmHg. FINDINGS  Left Ventricle: Left ventricular ejection fraction, by estimation, is 60 to 65%. The left ventricle has normal function. The left ventricle has no regional wall motion abnormalities. The left ventricular internal cavity size was normal in size. There is  no left ventricular hypertrophy. Left ventricular diastolic parameters are consistent with Grade I diastolic dysfunction (impaired relaxation). Right Ventricle: The right ventricular size is normal. No increase in right ventricular wall thickness. Right ventricular systolic function is normal. Left Atrium: Left atrial size was normal in size. Right Atrium: Right atrial size was normal in size. Pericardium: Small to moderate luscent circumferential effusion with large rind of likely hemorrhagic pericaridal material and RV diastolic collapse Given history of DOAC use and malignant cancer likely blood Not epicardial fat as this was not present on  TTE done in October Dr Johney Frame aware Not clear that she is a candiate for pericardial window and effusion cannot be cleared percutaneously. A large pericardial effusion is present. The pericardial effusion is circumferential. There is evidence of cardiac tamponade. Mitral Valve: The mitral valve  is normal in structure. No evidence of mitral valve regurgitation. No evidence of mitral valve stenosis. Tricuspid Valve: The tricuspid valve is normal in structure. Tricuspid valve regurgitation is not demonstrated. No evidence of tricuspid stenosis. Aortic Valve: The aortic valve was not well visualized. There is mild calcification of the aortic valve. There is mild thickening of the aortic valve. Aortic valve regurgitation is not visualized. Aortic valve sclerosis is present, with no evidence of aortic valve  stenosis. Aortic valve mean gradient measures 3.2 mmHg. Aortic valve peak gradient measures 6.6 mmHg. Pulmonic Valve: The pulmonic valve was normal in structure. Pulmonic valve regurgitation is not visualized. No evidence of pulmonic stenosis. Aorta: The aortic root is normal in size and structure. Venous: The inferior vena cava is normal in size with greater than 50% respiratory variability, suggesting right atrial pressure of 3 mmHg. IAS/Shunts: No atrial level shunt detected by color flow Doppler.  LEFT VENTRICLE PLAX 2D LVIDd:         4.30 cm     Diastology LVIDs:         2.30 cm     LV e' medial:    4.12 cm/s LV PW:         1.10 cm     LV E/e' medial:  14.3 LV IVS:        0.90 cm     LV e' lateral:   7.45 cm/s                            LV E/e' lateral: 7.9  LV Volumes (MOD) LV vol d, MOD A2C: 13.9 ml LV vol d, MOD A4C: 28.1 ml LV vol s, MOD A2C: 11.1 ml LV vol s, MOD A4C: 9.6 ml LV SV MOD A2C:     2.8 ml LV SV MOD A4C:     28.1 ml LV SV MOD BP:      8.9 ml RIGHT VENTRICLE RV S prime:     11.90 cm/s TAPSE (M-mode): 1.1 cm LEFT ATRIUM             Index LA Vol (A2C):   25.3 ml 15.75 ml/m LA Vol (A4C):   18.1 ml 11.27 ml/m LA Biplane Vol: 21.8 ml 13.57 ml/m  AORTIC VALVE                   PULMONIC VALVE AV Vmax:           128.25 cm/s PV Vmax:       1.44 m/s AV Vmean:          81.300 cm/s PV Vmean:      92.100 cm/s AV VTI:            0.160 m     PV VTI:        0.200 m AV Peak Grad:      6.6 mmHg    PV  Peak grad:  8.3 mmHg AV Mean Grad:      3.2 mmHg    PV Mean grad:  4.0 mmHg LVOT Vmax:         96.30 cm/s LVOT Vmean:        58.700 cm/s LVOT VTI:          0.115 m LVOT/AV VTI ratio: 0.72  AORTA Ao Asc diam: 3.00 cm MITRAL VALVE                TRICUSPID VALVE MV Area (PHT): 4.49 cm     TR Peak grad:   22.8 mmHg MV Decel Time: 169 msec     TR Vmax:        239.00 cm/s MV E velocity: 58.90 cm/s MV A velocity: 103.95 cm/s  SHUNTS MV E/A ratio:  0.57         Systemic VTI: 0.12 m Jenkins Rouge MD Electronically signed by Jenkins Rouge MD Signature Date/Time: 03/13/2022/11:56:27 AM    Final  CT HEAD CODE STROKE WO CONTRAST  Result Date: 03/01/2022 CLINICAL DATA:  Code stroke.  Neuro deficit, acute, stroke suspected EXAM: CT HEAD WITHOUT CONTRAST TECHNIQUE: Contiguous axial images were obtained from the base of the skull through the vertex without intravenous contrast. RADIATION DOSE REDUCTION: This exam was performed according to the departmental dose-optimization program which includes automated exposure control, adjustment of the mA and/or kV according to patient size and/or use of iterative reconstruction technique. COMPARISON:  CT head July 24, 2021. FINDINGS: Brain: No evidence of acute infarction, hemorrhage, hydrocephalus, or extra-axial collection. Redemonstrated 8 mm rounded dural-based calcified focus overlying the right frontal lobe. Similar severe patchy white matter hypodensities in the white matter, nonspecific but compatible with chronic microvascular ischemic disease. Vascular: Calcific intracranial spaces. No evidence vessel identified. Skull: No acute fracture. Sinuses/Orbits: Clear sinuses.  No acute orbital findings. Other: No mastoid effusions. ASPECTS [facility] Stroke Program Early CT Score) total score (0-10 with 10 being normal): 10. IMPRESSION: 1. No evidence of acute intracranial abnormality.  ASPECTS is 10. 2. Severe chronic microvascular ischemic disease. 3. Redemonstrated 8 mm rounded  dural-based calcified focus overlying the right frontal lobe, suspicious for an incidental meningioma. Code stroke imaging results were communicated on 02/28/2022 at 11:39 am to provider [facility] via secure text paging. Electronically Signed   By: Margaretha Sheffield M.D.   On: 03/11/2022 11:42   VAS [place] LOWER EXTREMITY VENOUS (DVT)  Result Date: 03/13/2022  Lower Venous DVT Study Patient Name:  [facility] [place] A[place]  Date of Exam:   03/13/2022 Medical Rec #: [id number]      Accession #:    [id number] Date of Birth: [birth year]      Patient Gender: F Patient Age:   12 years Exam Location:  [facility] Procedure:      VAS [place] LOWER EXTREMITY VENOUS (DVT) Referring Phys: Cornelius Moras XU --------------------------------------------------------------------------------  Indications: Embolic stroke.  Anticoagulation: Heparin. Comparison Study: 01-11-2022 Upper extremity venous right was positive for DVT.                    02-22-2021 Lower extremity venous left was negative for DVT. Performing Technologist: Darlin Coco RDMS, RVT  Examination Guidelines: A complete evaluation includes B-mode imaging, spectral Doppler, color Doppler, and power Doppler as needed of all accessible portions of each vessel. Bilateral testing is considered an integral part of a complete examination. Limited examinations for reoccurring indications may be performed as noted. The reflux portion of the exam is performed with the patient in reverse Trendelenburg.  +---------+---------------+---------+-----------+----------+--------------+ RIGHT    CompressibilityPhasicitySpontaneityPropertiesThrombus Aging +---------+---------------+---------+-----------+----------+--------------+ CFV      Full           Yes      Yes                                 +---------+---------------+---------+-----------+----------+--------------+ SFJ      Full                                                         +---------+---------------+---------+-----------+----------+--------------+ FV Prox  Full                                                        +---------+---------------+---------+-----------+----------+--------------+  FV Mid   Full                                                        +---------+---------------+---------+-----------+----------+--------------+ FV DistalFull                                                        +---------+---------------+---------+-----------+----------+--------------+ PFV      Full                                                        +---------+---------------+---------+-----------+----------+--------------+ POP      Full           Yes      Yes                                 +---------+---------------+---------+-----------+----------+--------------+ PTV      Full                                                        +---------+---------------+---------+-----------+----------+--------------+ PERO     Full                                                        +---------+---------------+---------+-----------+----------+--------------+ Gastroc  Full                                                        +---------+---------------+---------+-----------+----------+--------------+   +---------+---------------+---------+-----------+----------+--------------+ LEFT     CompressibilityPhasicitySpontaneityPropertiesThrombus Aging +---------+---------------+---------+-----------+----------+--------------+ CFV      Full           Yes      Yes                                 +---------+---------------+---------+-----------+----------+--------------+ SFJ      Full                                                        +---------+---------------+---------+-----------+----------+--------------+ FV Prox  Full                                                         +---------+---------------+---------+-----------+----------+--------------+  FV Mid   Full                                                        +---------+---------------+---------+-----------+----------+--------------+ FV DistalFull                                                        +---------+---------------+---------+-----------+----------+--------------+ PFV      Full                                                        +---------+---------------+---------+-----------+----------+--------------+ POP      Full           Yes      Yes                                 +---------+---------------+---------+-----------+----------+--------------+ PTV      Full                                                        +---------+---------------+---------+-----------+----------+--------------+ PERO     Partial        Yes      Yes                  Acute          +---------+---------------+---------+-----------+----------+--------------+ Gastroc  Full                                                        +---------+---------------+---------+-----------+----------+--------------+     Summary: RIGHT: - There is no evidence of deep vein thrombosis in the lower extremity.  - No cystic structure found in the popliteal fossa.  LEFT: - Findings consistent with acute deep vein thrombosis involving the left peroneal veins. - No cystic structure found in the popliteal fossa.  *See table(s) above for measurements and observations.    Preliminary    CT ANGIO HEAD NECK W WO CM W PERF (CODE STROKE)  Result Date: 03/05/2022 CLINICAL DATA:  Neuro deficit, acute, stroke suspected Right weakness and aphasia symptom onset Monday EXAM: CT ANGIOGRAPHY HEAD AND NECK CT PERFUSION BRAIN TECHNIQUE: Multidetector CT imaging of the head and neck was performed using the standard protocol during bolus administration of intravenous contrast. Multiplanar CT image reconstructions and MIPs were  obtained to evaluate the vascular anatomy. Carotid stenosis measurements (when applicable) are obtained utilizing NASCET criteria, using the distal internal carotid diameter as the denominator. Multiphase CT imaging of the brain was performed following IV bolus contrast injection. Subsequent parametric perfusion maps were calculated using RAPID software. RADIATION DOSE REDUCTION: This exam was performed according  to the departmental dose-optimization program which includes automated exposure control, adjustment of the mA and/or kV according to patient size and/or use of iterative reconstruction technique. CONTRAST:  173mL OMNIPAQUE IOHEXOL 350 MG/ML SOLN COMPARISON:  None Available. Same day CT head. FINDINGS: CTA NECK FINDINGS Aortic arch: Atherosclerosis of the aorta and great vessel origins. Great vessel origins are patent without significant stenosis. Right carotid system: Atherosclerosis at the carotid bifurcation with approximately 50% stenosis of the ICA origin. Left carotid system: Extensive predominately calcific atherosclerosis at the carotid bifurcation with approximately 80% stenosis of the ICA origin. Vertebral arteries: Right dominant. Bilateral ICAs are patent without significant (greater than 50%) stenosis. Atherosclerosis at the left vertebral artery origin. Skeleton: Apparent increased size of lucent lesions involving the C7 vertebral body sub to CT chest from February 14, 23. Given the apparent increase in size and patient's known malignancy. Multilevel degenerative change. Other neck: No acute findings. Upper chest: Masslike consolidation in the visualized left upper lobe, which is similar in size (approximately 3.5 x 1.6 cm on series 7, image 288) and was better characterized on prior CT chest from 02/27/2022. emphysema. Aortic atherosclerosis. Review of the MIP images confirms the above findings CTA HEAD FINDINGS Anterior circulation: Bilateral intracranial ICAs are patent with mild narrowing  due to calcific atherosclerosis. Bilateral M1 MCAs and ACAs are patent without proximal hemodynamically significant stenosis. Occlusion of a left superior M2 MCA branch proximally (series 7, image 93; series 8, image 89). Posterior circulation: Bilateral intradural vertebral arteries, basilar artery, and bilateral posterior cerebral arteries are patent without proximal hemodynamically significant stenosis. Venous sinuses: As permitted by contrast timing, patent. CT Brain Perfusion Findings: ASPECTS: 10. CBF (<30%) Volume: 11mL Perfusion (Tmax>6.0s) volume: 51mL Mismatch Volume: InfantmL Infarction Location:None identified IMPRESSION: 1. 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 2. On CT perfusion, 19 mL area of increased T-max time in the left MCA territory, correlating with the above occlusion and concerning for tissue at risk. 3. Approximately 80% left and 50% right ICA origin stenosis in the neck. 4. Apparent increased size of lucent lesions involving the C7 vertebral body relative to CT chest from February 14, 23. Given the apparent increase in size and patient's known malignancy, findings are concerning for osseous metastasis, but incompletely characterized on this study. An MRI with contrast or PET-CT could further evaluate if clinically warranted. 5. Masslike consolidation in the visualized left upper lobe, which is similar in size and was better characterized on prior CT chest from 02/27/2022. 6. Aortic Atherosclerosis (ICD10-I70.0) and Emphysema (ICD10-J43.9). Findings discussed with Dr. Theda Sers via telephone at 12:12 p.m. Electronically Signed   By: Margaretha Sheffield M.D.   On: 03/09/2022 12:17    Family Communication: Daughter at bedside this AM. Daughter, son, and closest friend at bedside this PM  Disposition: Status is: Inpatient Remains inpatient appropriate because: critically ill Planned Discharge Destination:  Inpatient demise vs. hospice  Patrecia Pour, MD 03/13/2022 2:09 PM Page by  [email or web address]

## 2022-03-13 NOTE — Consult Note (Signed)
Consultation Note Date: 03/13/2022   Patient Name: Holly [name]  DOB: December 03, 1945  MRN: [id number]  Age / Sex: 76 y.o., female  PCP: Harlan Stains, MD Referring Physician: Patrecia Pour, MD  Reason for Consultation: Establishing goals of care  HPI/Patient Profile: 76 y.o. 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   Patient was admitted for acute/subacute CVA and NSTEMI in the setting of acute stroke despite being on anticoagulation.  Found to have large pericardial effusion on TTE.  Also with UTI, osteomyelitis, compression fracture from recent fall. PMT has been consulted to assist with goals of care conversation.  Clinical Assessment and Goals of Care:  I have reviewed medical records including EPIC notes, labs and imaging, discussed with RN, assessed the patient and then met at the bedside with patient's daughter Holly [name], friend Holly [name], and son to discuss diagnosis prognosis, GOC, EOL wishes, disposition and options.  I introduced Palliative Medicine as specialized medical care for people living with serious illness. It focuses on providing relief from the symptoms and stress of a serious illness. The goal is to improve quality of life for both the patient and the family.  We discussed a brief life review of the patient and then focused on their current illness.  The natural disease trajectory and expectations at EOL were discussed.  I attempted to elicit values and goals of care important to the  patient.    Medical History Review and Understanding:  Discussed chronic comorbidities ongoing acute complications over the past several months.  Patient's family has a good understanding of severity of patient's illness with very poor prognosis.  Palliative Symptoms: Pain, anxiety, dyspnea  Code Status: Concepts specific to code status, artifical feeding and hydration, and rehospitalization were considered and discussed.   Discussion: Patient and her family confirm that after discussions with multiple specialists and deliberations they have decided for transition to comfort focused care.  They are at peace with this decision and understand that Holly [name]'s time will be very limited.  We discussed comfort care and reviewed that patient would no longer receive aggressive medical interventions such as continuous vital signs, lab work, radiology testing, or medications not focused on comfort. All care would focus on how the patient is looking and feeling. This would include management of any symptoms that may cause discomfort, pain, shortness of breath, cough, nausea, agitation, anxiety, and/or secretions etc. Symptoms would be managed with medications and other non-pharmacological interventions such as spiritual support if requested, repositioning, music therapy, or therapeutic listening. Family verbalized understanding and appreciation.  Discussed risks and benefits of benzodiazepines and opioids for her most disturbing symptoms of pain and anxiety, counseling on the option to schedule versus liberalize as needed.  Patient would like to schedule these medications and get ahead of her symptoms with as needed doses available to her as well.  She would be agreeable to taking them together even if risking sedation.  They would like to discuss disposition and hospice further tomorrow as it has already been a day full of a lot of information.   Questions and concerns were addressed.  Hard Choices booklet left for  review.  Gone  from my sight booklet provided for review.  The family was encouraged to call with questions or concerns.  PMT will continue to support holistically.    SUMMARY OF RECOMMENDATIONS   -DNR  -Agree with transition to comfort care today -PO Xanax increased to Q4H with PRN Ativan Q2H for anxiety, dyspnea, sleep -Scheduled 22m PO Dilaudid Q4H and increased [facility], [facility]  -Psychosocial and emotional support provided -PMT will continue to follow  Prognosis:  Very poor, possibly residential hospice appropriate  Discharge Planning: To Be Determined      Primary Diagnoses: Present on Admission:  CVA (cerebral vascular accident) ([facility]  Elevated troponin  Hypothyroidism  Hypotension  Osteomyelitis ([facility]  DNR (do not resuscitate)  New onset right bundle branch block (RBBB)  Malignant neoplasm of middle lobe of right lung (HCC)  Leukocytosis  SIRS (systemic inflammatory response syndrome) (HCC)  COPD GOLD III   Chronic respiratory failure with hypoxia (HCC)  Compression fracture    Sacral pressure ulcer  Anemia due to chronic kidney disease    Physical Exam Vitals and nursing note reviewed.  Constitutional:      General: She is not in acute distress.    Appearance: She is ill-appearing.  Cardiovascular:     Rate and Rhythm: Tachycardia present.  Skin:    General: Skin is warm and dry.  Neurological:     Mental Status: She is alert.  Psychiatric:        Mood and Affect: Mood normal.    Vital Signs: BP (!) 116/91   Pulse (!) 108   Temp 97.8 F (36.6 C) (Oral)   Resp (!) 23   Ht '5\' 5"'  (1.651 m)   Wt 55.6 kg   SpO2 94%   BMI 20.40 kg/m  Pain Scale: 0-10   Pain Score: 0-No pain   SpO2: SpO2: 94 % O2 Device:SpO2: 94 % O2 Flow Rate: .O2 Flow Rate (L/min): 3 L/min   Palliative Assessment/Data:     MDM: High   Holly [name] PJohnnette Litter PA-C  Palliative Medicine Team Team phone # [phone] Thank you for  allowing the Palliative Medicine Team to assist in the care of this patient. Please utilize secure chat with additional questions, if there is no response within 30 minutes please call the above phone number.  Palliative Medicine Team providers are available by phone from 7am to 7pm daily and can be reached through the team cell phone.  Should this patient require assistance outside of these hours, please call the patient's attending physician.

## 2022-03-13 NOTE — Progress Notes (Addendum)
Progress Note  Patient Name: Holly [name] Date of Encounter: 03/13/2022  [facility] Cardiologist: None   Subjective   TTE reviewed today and shows a large pericardial effusion with what appears to be a hemorrhagic rind around the RV with evidence of mild RA/RV collapse although IVC does show respiratory variation. Not clinically in tamponade, although is tachycardic, however, BP has been stable in the 110-150s.   Long discussion with family at bedside about the findings. Patient denies any chest pain and breathing is overall improved.   Inpatient Medications    Scheduled Meds:  atorvastatin  20 mg Oral QHS   buPROPion  300 mg Oral q morning   busPIRone  5 mg Oral BID   cephALEXin  500 mg Oral TID   collagenase   Topical Daily   donepezil  10 mg Oral QHS   doxycycline  100 mg Oral BID   fluticasone furoate-vilanterol  1 puff Inhalation Daily   And   [START ON 03/14/2022] umeclidinium bromide  1 puff Inhalation Daily   levothyroxine  137 mcg Oral QAC breakfast   mirtazapine  30 mg Oral QHS   pantoprazole  40 mg Oral BID   predniSONE  20 mg Oral Q breakfast   Continuous Infusions:  heparin 700 Units/hr (03/13/22 0424)   lactated ringers 100 mL/hr at 03/13/22 0730   PRN Meds: acetaminophen **OR** acetaminophen (TYLENOL) oral liquid 160 mg/5 mL **OR** acetaminophen, albuterol, ALPRAZolam, oxyCODONE   Vital Signs    Vitals:   03/13/22 0347 03/13/22 0756 03/13/22 0840 03/13/22 0845  BP: (!) 141/64 (!) 151/82    Pulse: 78 91    Resp: (!) 24 (!) 22    Temp: 98.3 F (36.8 C) 97.8 F (36.6 C)    TempSrc: Axillary Oral    SpO2: 91% 94% 100% 100%  Weight:      Height:        Intake/Output Summary (Last 24 hours) at 03/13/2022 1142 Last data filed at 03/13/2022 0901 Gross per 24 hour  Intake --  Output 1150 ml  Net -1150 ml      03/14/2022   11:00 AM 03/08/2022    3:29 PM 01/26/2022    3:45 PM  Last 3 Weights  Weight (lbs) 122 lb 9.2 oz 126 lb 4.8 oz 139 lb 15.9  oz  Weight (kg) 55.6 kg 57.289 kg 63.5 kg      Telemetry    NSR/sinus tachycardia - Personally Reviewed  ECG    SR, inferior infarct, RBBB - Personally Reviewed  Physical Exam   GEN: Expressive aphasia, chronically ill appearing  Neck: JVD to the angle of the mandible when laying near flat Cardiac: Tachycardic, regular  Respiratory: CTAB on anterior exam GI: Soft, nontender, non-distended  MS: Thin, toes are cool Neuro:  Expressive aphasia but comprehension intact Psych: Normal affect   Labs    High Sensitivity Troponin:   Recent Labs  Lab 03/07/2022 1128 03/02/2022 1457 03/13/22 0925  TROPONINIHS 2,671* 1,865* 1,977*     Chemistry Recent Labs  Lab 03/08/22 1510 02/14/2022 1128 03/16/2022 1132 03/13/22 0217  NA 142 137 137 141  K 4.8 4.0 3.9 3.6  CL 104 97* 96* 102  CO2 33* 32  --  32  GLUCOSE 188* 165* 161* 90  BUN 32* 17 19 13   CREATININE 0.87 0.86 0.90 0.76  CALCIUM 9.7 9.8  --  9.8  PROT 6.1* 5.9*  --   --   ALBUMIN 2.8* 2.5*  --   --  AST 33 36  --   --   ALT 17 21  --   --   ALKPHOS 199* 179*  --   --   BILITOT 0.3 0.6  --   --   GFRNONAA >60 >60  --  >60  ANIONGAP 5 8  --  7    Lipids  Recent Labs  Lab 03/07/2022 1959  CHOL 137  TRIG 166*  HDL 43  LDLCALC 61  CHOLHDL 3.2    Hematology Recent Labs  Lab 03/08/22 1510 02/21/2022 1128 03/04/2022 1132 03/13/22 0217  WBC 13.0* 12.0*  --  11.3*  RBC 3.29* 3.12*  --  2.97*  HGB 9.0* 8.7* 9.9* 8.1*  HCT 31.2* 29.7* 29.0* 28.0*  MCV 94.8 95.2  --  94.3  MCH 27.4 27.9  --  27.3  MCHC 28.8* 29.3*  --  28.9*  RDW 17.5* 17.6*  --  17.6*  PLT 114* 120*  --  134*   Thyroid  Recent Labs  Lab 03/06/2022 1959  TSH 1.756    BNP Recent Labs  Lab 02/28/2022 1128  BNP 186.8*    DDimer No results for input(s): DDIMER in the last 168 hours.   Radiology    MR BRAIN WO CONTRAST  Result Date: 03/13/2022 CLINICAL DATA:  Weakness EXAM: MRI HEAD WITHOUT CONTRAST TECHNIQUE: Multiplanar, multiecho pulse  sequences of the brain and surrounding structures were obtained without intravenous contrast. COMPARISON:  Head CT and CTA from yesterday FINDINGS: Brain: Small acute infarcts in the bilateral cerebellum and left more than right cerebral convexity, most extensive in the left MCA territory along the frontal and parietal cortex. Mild patchy white matter involvement and involvement at the right caudate body. Confluent chronic small vessel ischemic gliosis in the cerebral white matter. Age congruent brain volume. No acute hemorrhage, hydrocephalus, or masslike finding. Vascular: Major flow voids are preserved Skull and upper cervical spine: No focal marrow lesion. Sinuses/Orbits: Right cataract resection IMPRESSION: 1. Small scattered acute infarcts in multiple vascular distributions suggesting central embolic disease. Greatest involvement is in the left MCA distribution. 2. Advanced chronic small vessel ischemia. Electronically Signed   By: Jorje Guild M.D.   On: 03/13/2022 05:06   DG Chest Port 1 View  Result Date: 02/26/2022 CLINICAL DATA:  Weakness. Stroke. Non-small-cell lung cancer treated with chemotherapy and radiation therapy. EXAM: PORTABLE CHEST 1 VIEW COMPARISON:  08/28/2021.  Chest CT dated 11/30/2021. FINDINGS: Mildly enlarged cardiac silhouette, accentuated above prominent pericardial and epicardial fat on the previous CT. Mild patchy and linear opacity in the left upper lobe, similar to the previous CT. The previously demonstrated right middle lobe mass is currently visualized. No pleural fluid. Mildly tortuous and calcified thoracic aorta. Mild scoliosis. IMPRESSION: No acute abnormality. Grossly stable areas of consolidation in the left upper lobe compared to the previous CT dated 11/30/2021 and described in more detail in that report. Electronically Signed   By: Claudie Revering M.D.   On: 02/24/2022 13:31   CT HEAD CODE STROKE WO CONTRAST  Result Date: 02/26/2022 CLINICAL DATA:  Code stroke.   Neuro deficit, acute, stroke suspected EXAM: CT HEAD WITHOUT CONTRAST TECHNIQUE: Contiguous axial images were obtained from the base of the skull through the vertex without intravenous contrast. RADIATION DOSE REDUCTION: This exam was performed according to the departmental dose-optimization program which includes automated exposure control, adjustment of the mA and/or kV according to patient size and/or use of iterative reconstruction technique. COMPARISON:  CT head July 24, 2021. FINDINGS: Brain:  No evidence of acute infarction, hemorrhage, hydrocephalus, or extra-axial collection. Redemonstrated 8 mm rounded dural-based calcified focus overlying the right frontal lobe. Similar severe patchy white matter hypodensities in the white matter, nonspecific but compatible with chronic microvascular ischemic disease. Vascular: Calcific intracranial spaces. No evidence vessel identified. Skull: No acute fracture. Sinuses/Orbits: Clear sinuses.  No acute orbital findings. Other: No mastoid effusions. ASPECTS [facility] Stroke Program Early CT Score) total score (0-10 with 10 being normal): 10. IMPRESSION: 1. No evidence of acute intracranial abnormality.  ASPECTS is 10. 2. Severe chronic microvascular ischemic disease. 3. Redemonstrated 8 mm rounded dural-based calcified focus overlying the right frontal lobe, suspicious for an incidental meningioma. Code stroke imaging results were communicated on 02/27/2022 at 11:39 am to provider [facility] via secure text paging. Electronically Signed   By: Margaretha Sheffield M.D.   On: 03/09/2022 11:42   VAS [place] LOWER EXTREMITY VENOUS (DVT)  Result Date: 03/13/2022  Lower Venous DVT Study Patient Name:  [facility] Avers  Date of Exam:   03/13/2022 Medical Rec #: [id number]      Accession #:    [id number] Date of Birth: 05/25/1946      Patient Gender: F Patient Age:   49 years Exam Location:  [facility] Procedure:      VAS [place] LOWER EXTREMITY VENOUS (DVT) Referring Phys: Cornelius Moras XU  --------------------------------------------------------------------------------  Indications: Embolic stroke.  Anticoagulation: Heparin. Comparison Study: 01-11-2022 Upper extremity venous right was positive for DVT.                    02-22-2021 Lower extremity venous left was negative for DVT. Performing Technologist: Darlin Coco RDMS, RVT  Examination Guidelines: A complete evaluation includes B-mode imaging, spectral Doppler, color Doppler, and power Doppler as needed of all accessible portions of each vessel. Bilateral testing is considered an integral part of a complete examination. Limited examinations for reoccurring indications may be performed as noted. The reflux portion of the exam is performed with the patient in reverse Trendelenburg.  +---------+---------------+---------+-----------+----------+--------------+ RIGHT    CompressibilityPhasicitySpontaneityPropertiesThrombus Aging +---------+---------------+---------+-----------+----------+--------------+ CFV      Full           Yes      Yes                                 +---------+---------------+---------+-----------+----------+--------------+ SFJ      Full                                                        +---------+---------------+---------+-----------+----------+--------------+ FV Prox  Full                                                        +---------+---------------+---------+-----------+----------+--------------+ FV Mid   Full                                                        +---------+---------------+---------+-----------+----------+--------------+ FV DistalFull                                                        +---------+---------------+---------+-----------+----------+--------------+  PFV      Full                                                        +---------+---------------+---------+-----------+----------+--------------+ POP      Full           Yes      Yes                                  +---------+---------------+---------+-----------+----------+--------------+ PTV      Full                                                        +---------+---------------+---------+-----------+----------+--------------+ PERO     Full                                                        +---------+---------------+---------+-----------+----------+--------------+ Gastroc  Full                                                        +---------+---------------+---------+-----------+----------+--------------+   +---------+---------------+---------+-----------+----------+--------------+ LEFT     CompressibilityPhasicitySpontaneityPropertiesThrombus Aging +---------+---------------+---------+-----------+----------+--------------+ CFV      Full           Yes      Yes                                 +---------+---------------+---------+-----------+----------+--------------+ SFJ      Full                                                        +---------+---------------+---------+-----------+----------+--------------+ FV Prox  Full                                                        +---------+---------------+---------+-----------+----------+--------------+ FV Mid   Full                                                        +---------+---------------+---------+-----------+----------+--------------+ FV DistalFull                                                        +---------+---------------+---------+-----------+----------+--------------+  PFV      Full                                                        +---------+---------------+---------+-----------+----------+--------------+ POP      Full           Yes      Yes                                 +---------+---------------+---------+-----------+----------+--------------+ PTV      Full                                                         +---------+---------------+---------+-----------+----------+--------------+ PERO     Partial        Yes      Yes                  Acute          +---------+---------------+---------+-----------+----------+--------------+ Gastroc  Full                                                        +---------+---------------+---------+-----------+----------+--------------+     Summary: RIGHT: - There is no evidence of deep vein thrombosis in the lower extremity.  - No cystic structure found in the popliteal fossa.  LEFT: - Findings consistent with acute deep vein thrombosis involving the left peroneal veins. - No cystic structure found in the popliteal fossa.  *See table(s) above for measurements and observations.    Preliminary    CT ANGIO HEAD NECK W WO CM W PERF (CODE STROKE)  Result Date: 03/09/2022 CLINICAL DATA:  Neuro deficit, acute, stroke suspected Right weakness and aphasia symptom onset Monday EXAM: CT ANGIOGRAPHY HEAD AND NECK CT PERFUSION BRAIN TECHNIQUE: Multidetector CT imaging of the head and neck was performed using the standard protocol during bolus administration of intravenous contrast. Multiplanar CT image reconstructions and MIPs were obtained to evaluate the vascular anatomy. Carotid stenosis measurements (when applicable) are obtained utilizing NASCET criteria, using the distal internal carotid diameter as the denominator. Multiphase CT imaging of the brain was performed following IV bolus contrast injection. Subsequent parametric perfusion maps were calculated using RAPID software. RADIATION DOSE REDUCTION: This exam was performed according to the departmental dose-optimization program which includes automated exposure control, adjustment of the mA and/or kV according to patient size and/or use of iterative reconstruction technique. CONTRAST:  137mL OMNIPAQUE IOHEXOL 350 MG/ML SOLN COMPARISON:  None Available. Same day CT head. FINDINGS: CTA NECK FINDINGS Aortic arch: Atherosclerosis  of the aorta and great vessel origins. Great vessel origins are patent without significant stenosis. Right carotid system: Atherosclerosis at the carotid bifurcation with approximately 50% stenosis of the ICA origin. Left carotid system: Extensive predominately calcific atherosclerosis at the carotid bifurcation with approximately 80% stenosis of the ICA origin. Vertebral arteries: Right dominant. Bilateral ICAs are patent without significant (greater than 50%) stenosis. Atherosclerosis at the left vertebral  artery origin. Skeleton: Apparent increased size of lucent lesions involving the C7 vertebral body sub to CT chest from February 14, 23. Given the apparent increase in size and patient's known malignancy. Multilevel degenerative change. Other neck: No acute findings. Upper chest: Masslike consolidation in the visualized left upper lobe, which is similar in size (approximately 3.5 x 1.6 cm on series 7, image 288) and was better characterized on prior CT chest from 02/27/2022. emphysema. Aortic atherosclerosis. Review of the MIP images confirms the above findings CTA HEAD FINDINGS Anterior circulation: Bilateral intracranial ICAs are patent with mild narrowing due to calcific atherosclerosis. Bilateral M1 MCAs and ACAs are patent without proximal hemodynamically significant stenosis. Occlusion of a left superior M2 MCA branch proximally (series 7, image 93; series 8, image 89). Posterior circulation: Bilateral intradural vertebral arteries, basilar artery, and bilateral posterior cerebral arteries are patent without proximal hemodynamically significant stenosis. Venous sinuses: As permitted by contrast timing, patent. CT Brain Perfusion Findings: ASPECTS: 10. CBF (<30%) Volume: 83mL Perfusion (Tmax>6.0s) volume: 58mL Mismatch Volume: InfantmL Infarction Location:None identified IMPRESSION: 1. 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 2. On CT perfusion, 19 mL area of increased T-max time in the left MCA  territory, correlating with the above occlusion and concerning for tissue at risk. 3. Approximately 80% left and 50% right ICA origin stenosis in the neck. 4. Apparent increased size of lucent lesions involving the C7 vertebral body relative to CT chest from February 14, 23. Given the apparent increase in size and patient's known malignancy, findings are concerning for osseous metastasis, but incompletely characterized on this study. An MRI with contrast or PET-CT could further evaluate if clinically warranted. 5. Masslike consolidation in the visualized left upper lobe, which is similar in size and was better characterized on prior CT chest from 02/27/2022. 6. Aortic Atherosclerosis (ICD10-I70.0) and Emphysema (ICD10-J43.9). Findings discussed with Dr. Theda Sers via telephone at 12:12 p.m. Electronically Signed   By: Margaretha Sheffield M.D.   On: 02/28/2022 12:17    Cardiac Studies   TTE 03/13/22: IMPRESSIONS   1. Left ventricular ejection fraction, by estimation, is 60 to 65%. The  left ventricle has normal function. The left ventricle has no regional  wall motion abnormalities. Left ventricular diastolic parameters are  consistent with Grade I diastolic  dysfunction (impaired relaxation).   2. Right ventricular systolic function is normal. The right ventricular  size is normal.   3. Small to moderate luscent circumferential effusion with large rind of  likely hemorrhagic pericaridal material and RV diastolic collapse Given  history of DOAC use and malignant cancer likely blood Not epicardial fat  as this was not present on TTE done   in October Dr Johney Frame aware Not clear that she is a candiate for  pericardial window and effusion cannot be cleared percutaneously . Large  pericardial effusion. The pericardial effusion is circumferential.  Findings are consistent with cardiac  tamponade.   4. The mitral valve is normal in structure. No evidence of mitral valve  regurgitation. No evidence of  mitral stenosis.   5. The aortic valve was not well visualized. There is mild calcification  of the aortic valve. There is mild thickening of the aortic valve. Aortic  valve regurgitation is not visualized. Aortic valve sclerosis is present,  with no evidence of aortic valve   stenosis.   6. The inferior vena cava is normal in size with greater than 50%  respiratory variability, suggesting right atrial pressure of 3 mmHg.  Patient Profile  76 y.o. 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  Assessment & Plan    #Large Pericardial Effusion with Features of Tamponade: Patient with new circumferential pericardial effusion with large rind around the RV concerning for hemorrhagic vs malignant effusion. This is new since TTE on 07/2021. Notably has metastatic lung cancer with history of PE/DVT and has been on apixaban for [facility]. Clinically, she is not currently in tamponade although she is tachycardic. BP has been in the 140s. RV looks relatively underfilled. Will try to temporize with IVF and monitor hemodynamics closely. Given significant comorbities, she is not a surgical candidate but would be suitable for pericardial drain. This was discussed with the patient and the family at bedside. They would like to discuss further at this time, but would like proceed if needed. Overall, patient's prognosis is very poor. -Give additional IVF now -Stop heparin gtt for now given concern for  hemorrhagic etiology for effusion -There are features concerning for tamponade on TTE but she is not currently clinically in tamponade (SBP 140s) although she is becoming more tachycardic -Discussed option of pericardial drain as an attempt to palliate symptoms as she is not a surgical candidate -Family would like to discuss procedure further -Will plan on serial monitoring of effusion with TTEs -Will need close monitoring of HD as she is high risk for decompensation -Overall, prognosis is very poor  #NSTEMI: Trop [id number]. Occurred in the setting of acute stroke. Suspect demand although patient does have inferior T waves on ECG that are new from previous suggesting she may have had inferior MI. TTE on my review shows possible inferior hypokinesis, although wall motion is difficult to evaluate due to limited study. Overall, she is not a candidate for catheterization and will manage medically. Stopping heparin gtt for now given concern for hemorrhagic effusion. -Continue lipitor 20mg  daily -Stopped heparin as above given effusion -No antihypertensives given large pericardial effusion as above  #Acute Stroke: Presented with aphasia, right sided facial droop and right sided weakness. CTA with occlusion of proximal left M2 w, but TNK was not given due to use of apixaban and thrombectomy was not performed due to history of metastatic lung cancer and baseline mRS of 5. MRI shows scattered small acute infarcts, likely embolic. Neuro following. -Management per Neuro  #RUE DVT: Was on apixaban at home. Now holding AC due to concern for hemorrhagic pericardial effusion as above.  #Metastatic Lung Cancer: Metastatic to left hip and liver. TTE concerning for possible malignant effusion. Follows with onc.  #History of MRSA Osteo #COPD with chronic hypoxia #Compression fracture of the back #Recent UTI #Sacral ulcer: #Anemia and thrombocytopenia #Diverticulitis with perforation now with  colostomy  Overall, very poor prognosis with high risk for decompensation. Discussed at length with family and they would like to discuss further before deciding whether or not they would want to proceed with drainage. If signs of clinical decompensation with rising HR/hypotension, would likely need more urgent drain if patient amenable.     For questions or updates, please contact [facility] Please consult [email or web address] for  contact info under        Signed, Freada Bergeron, MD  03/13/2022, 11:42 AM

## 2022-03-13 NOTE — Progress Notes (Addendum)
STROKE TEAM PROGRESS NOTE   INTERVAL HISTORY Patient is seen in her room with no family at the bedside.  Yesterday, she was found to have aphasia, right sided facial droop and right sided weakness. Occlusion of proximal left M2 was found, but TNK was not given due to use of apixaban and thrombectomy was not performed due to history of metastatic lung cancer and baseline mRS of 5. MRI shows scattered small acute infarcts, likely embolic.  Troponin was noted to be elevated, likely due to demand ischemia, but heparin was started as ACS was unable to be ruled out.  Vitals:   03/13/22 0347 03/13/22 0756 03/13/22 0840 03/13/22 0845  BP: (!) 141/64 (!) 151/82    Pulse: 78 91    Resp: (!) 24 (!) 22    Temp: 98.3 F (36.8 C) 97.8 F (36.6 C)    TempSrc: Axillary Oral    SpO2: 91% 94% 100% 100%  Weight:      Height:       CBC:  Recent Labs  Lab 03/08/22 1510 03/08/22 1510 03/04/2022 1128 02/23/2022 1132 03/13/22 0217  WBC 13.0*  --  12.0*  --  11.3*  NEUTROABS 10.9*  --  9.1*  --   --   HGB 9.0*   < > 8.7* 9.9* 8.1*  HCT 31.2*  --  29.7* 29.0* 28.0*  MCV 94.8  --  95.2  --  94.3  PLT 114*  --  120*  --  134*   < > = values in this interval not displayed.   Basic Metabolic Panel:  Recent Labs  Lab 03/02/2022 1128 02/23/2022 1132 03/13/22 0217  NA 137 137 141  K 4.0 3.9 3.6  CL 97* 96* 102  CO2 32  --  32  GLUCOSE 165* 161* 90  BUN 17 19 13   CREATININE 0.86 0.90 0.76  CALCIUM 9.8  --  9.8   Lipid Panel:  Recent Labs  Lab 03/06/2022 1959  CHOL 137  TRIG 166*  HDL 43  CHOLHDL 3.2  VLDL 33  LDLCALC 61   HgbA1c:  Recent Labs  Lab 02/28/2022 1959  HGBA1C 5.1   Urine Drug Screen: No results for input(s): LABOPIA, COCAINSCRNUR, LABBENZ, AMPHETMU, THCU, LABBARB in the last 168 hours.  Alcohol Level No results for input(s): ETH in the last 168 hours.  IMAGING past 24 hours MR BRAIN WO CONTRAST  Result Date: 03/13/2022 CLINICAL DATA:  Weakness EXAM: MRI HEAD WITHOUT CONTRAST  TECHNIQUE: Multiplanar, multiecho pulse sequences of the brain and surrounding structures were obtained without intravenous contrast. COMPARISON:  Head CT and CTA from yesterday FINDINGS: Brain: Small acute infarcts in the bilateral cerebellum and left more than right cerebral convexity, most extensive in the left MCA territory along the frontal and parietal cortex. Mild patchy white matter involvement and involvement at the right caudate body. Confluent chronic small vessel ischemic gliosis in the cerebral white matter. Age congruent brain volume. No acute hemorrhage, hydrocephalus, or masslike finding. Vascular: Major flow voids are preserved Skull and upper cervical spine: No focal marrow lesion. Sinuses/Orbits: Right cataract resection IMPRESSION: 1. Small scattered acute infarcts in multiple vascular distributions suggesting central embolic disease. Greatest involvement is in the left MCA distribution. 2. Advanced chronic small vessel ischemia. Electronically Signed   By: Jorje Guild M.D.   On: 03/13/2022 05:06   DG Chest Port 1 View  Result Date: 03/03/2022 CLINICAL DATA:  Weakness. Stroke. Non-small-cell lung cancer treated with chemotherapy and radiation therapy. EXAM: PORTABLE CHEST  1 VIEW COMPARISON:  08/28/2021.  Chest CT dated 11/30/2021. FINDINGS: Mildly enlarged cardiac silhouette, accentuated above prominent pericardial and epicardial fat on the previous CT. Mild patchy and linear opacity in the left upper lobe, similar to the previous CT. The previously demonstrated right middle lobe mass is currently visualized. No pleural fluid. Mildly tortuous and calcified thoracic aorta. Mild scoliosis. IMPRESSION: No acute abnormality. Grossly stable areas of consolidation in the left upper lobe compared to the previous CT dated 11/30/2021 and described in more detail in that report. Electronically Signed   By: Claudie Revering M.D.   On: 02/17/2022 13:31   CT HEAD CODE STROKE WO CONTRAST  Result Date:  03/01/2022 CLINICAL DATA:  Code stroke.  Neuro deficit, acute, stroke suspected EXAM: CT HEAD WITHOUT CONTRAST TECHNIQUE: Contiguous axial images were obtained from the base of the skull through the vertex without intravenous contrast. RADIATION DOSE REDUCTION: This exam was performed according to the departmental dose-optimization program which includes automated exposure control, adjustment of the mA and/or kV according to patient size and/or use of iterative reconstruction technique. COMPARISON:  CT head July 24, 2021. FINDINGS: Brain: No evidence of acute infarction, hemorrhage, hydrocephalus, or extra-axial collection. Redemonstrated 8 mm rounded dural-based calcified focus overlying the right frontal lobe. Similar severe patchy white matter hypodensities in the white matter, nonspecific but compatible with chronic microvascular ischemic disease. Vascular: Calcific intracranial spaces. No evidence vessel identified. Skull: No acute fracture. Sinuses/Orbits: Clear sinuses.  No acute orbital findings. Other: No mastoid effusions. ASPECTS [facility] Stroke Program Early CT Score) total score (0-10 with 10 being normal): 10. IMPRESSION: 1. No evidence of acute intracranial abnormality.  ASPECTS is 10. 2. Severe chronic microvascular ischemic disease. 3. Redemonstrated 8 mm rounded dural-based calcified focus overlying the right frontal lobe, suspicious for an incidental meningioma. Code stroke imaging results were communicated on 02/19/2022 at 11:39 am to provider [facility] via secure text paging. Electronically Signed   By: Margaretha Sheffield M.D.   On: 03/01/2022 11:42   VAS [place] LOWER EXTREMITY VENOUS (DVT)  Result Date: 03/13/2022  Lower Venous DVT Study Patient Name:  [facility] [place] Avers  Date of Exam:   03/13/2022 Medical Rec #: [id number]      Accession #:    [id number] Date of Birth: 1946/05/15      Patient Gender: F Patient Age:   76 years Exam Location:  [facility] Procedure:      VAS [place] LOWER EXTREMITY  VENOUS (DVT) Referring Phys: Cornelius Moras Karra Pink --------------------------------------------------------------------------------  Indications: Embolic stroke.  Anticoagulation: Heparin. Comparison Study: 01-11-2022 Upper extremity venous right was positive for DVT.                    02-22-2021 Lower extremity venous left was negative for DVT. Performing Technologist: Darlin Coco RDMS, RVT  Examination Guidelines: A complete evaluation includes B-mode imaging, spectral Doppler, color Doppler, and power Doppler as needed of all accessible portions of each vessel. Bilateral testing is considered an integral part of a complete examination. Limited examinations for reoccurring indications may be performed as noted. The reflux portion of the exam is performed with the patient in reverse Trendelenburg.  +---------+---------------+---------+-----------+----------+--------------+ RIGHT    CompressibilityPhasicitySpontaneityPropertiesThrombus Aging +---------+---------------+---------+-----------+----------+--------------+ CFV      Full           Yes      Yes                                 +---------+---------------+---------+-----------+----------+--------------+  SFJ      Full                                                        +---------+---------------+---------+-----------+----------+--------------+ FV Prox  Full                                                        +---------+---------------+---------+-----------+----------+--------------+ FV Mid   Full                                                        +---------+---------------+---------+-----------+----------+--------------+ FV DistalFull                                                        +---------+---------------+---------+-----------+----------+--------------+ PFV      Full                                                        +---------+---------------+---------+-----------+----------+--------------+ POP       Full           Yes      Yes                                 +---------+---------------+---------+-----------+----------+--------------+ PTV      Full                                                        +---------+---------------+---------+-----------+----------+--------------+ PERO     Full                                                        +---------+---------------+---------+-----------+----------+--------------+ Gastroc  Full                                                        +---------+---------------+---------+-----------+----------+--------------+   +---------+---------------+---------+-----------+----------+--------------+ LEFT     CompressibilityPhasicitySpontaneityPropertiesThrombus Aging +---------+---------------+---------+-----------+----------+--------------+ CFV      Full           Yes      Yes                                 +---------+---------------+---------+-----------+----------+--------------+  SFJ      Full                                                        +---------+---------------+---------+-----------+----------+--------------+ FV Prox  Full                                                        +---------+---------------+---------+-----------+----------+--------------+ FV Mid   Full                                                        +---------+---------------+---------+-----------+----------+--------------+ FV DistalFull                                                        +---------+---------------+---------+-----------+----------+--------------+ PFV      Full                                                        +---------+---------------+---------+-----------+----------+--------------+ POP      Full           Yes      Yes                                 +---------+---------------+---------+-----------+----------+--------------+ PTV      Full                                                         +---------+---------------+---------+-----------+----------+--------------+ PERO     Partial        Yes      Yes                  Acute          +---------+---------------+---------+-----------+----------+--------------+ Gastroc  Full                                                        +---------+---------------+---------+-----------+----------+--------------+     Summary: RIGHT: - There is no evidence of deep vein thrombosis in the lower extremity.  - No cystic structure found in the popliteal fossa.  LEFT: - Findings consistent with acute deep vein thrombosis involving the left peroneal veins. - No cystic structure found in the popliteal fossa.  *See table(s) above for measurements and observations.    Preliminary  CT ANGIO HEAD NECK W WO CM W PERF (CODE STROKE)  Result Date: 03/03/2022 CLINICAL DATA:  Neuro deficit, acute, stroke suspected Right weakness and aphasia symptom onset Monday EXAM: CT ANGIOGRAPHY HEAD AND NECK CT PERFUSION BRAIN TECHNIQUE: Multidetector CT imaging of the head and neck was performed using the standard protocol during bolus administration of intravenous contrast. Multiplanar CT image reconstructions and MIPs were obtained to evaluate the vascular anatomy. Carotid stenosis measurements (when applicable) are obtained utilizing NASCET criteria, using the distal internal carotid diameter as the denominator. Multiphase CT imaging of the brain was performed following IV bolus contrast injection. Subsequent parametric perfusion maps were calculated using RAPID software. RADIATION DOSE REDUCTION: This exam was performed according to the departmental dose-optimization program which includes automated exposure control, adjustment of the mA and/or kV according to patient size and/or use of iterative reconstruction technique. CONTRAST:  19mL OMNIPAQUE IOHEXOL 350 MG/ML SOLN COMPARISON:  None Available. Same day CT head. FINDINGS: CTA NECK FINDINGS Aortic  arch: Atherosclerosis of the aorta and great vessel origins. Great vessel origins are patent without significant stenosis. Right carotid system: Atherosclerosis at the carotid bifurcation with approximately 50% stenosis of the ICA origin. Left carotid system: Extensive predominately calcific atherosclerosis at the carotid bifurcation with approximately 80% stenosis of the ICA origin. Vertebral arteries: Right dominant. Bilateral ICAs are patent without significant (greater than 50%) stenosis. Atherosclerosis at the left vertebral artery origin. Skeleton: Apparent increased size of lucent lesions involving the C7 vertebral body sub to CT chest from February 14, 23. Given the apparent increase in size and patient's known malignancy. Multilevel degenerative change. Other neck: No acute findings. Upper chest: Masslike consolidation in the visualized left upper lobe, which is similar in size (approximately 3.5 x 1.6 cm on series 7, image 288) and was better characterized on prior CT chest from 02/27/2022. emphysema. Aortic atherosclerosis. Review of the MIP images confirms the above findings CTA HEAD FINDINGS Anterior circulation: Bilateral intracranial ICAs are patent with mild narrowing due to calcific atherosclerosis. Bilateral M1 MCAs and ACAs are patent without proximal hemodynamically significant stenosis. Occlusion of a left superior M2 MCA branch proximally (series 7, image 93; series 8, image 89). Posterior circulation: Bilateral intradural vertebral arteries, basilar artery, and bilateral posterior cerebral arteries are patent without proximal hemodynamically significant stenosis. Venous sinuses: As permitted by contrast timing, patent. CT Brain Perfusion Findings: ASPECTS: 10. CBF (<30%) Volume: 48mL Perfusion (Tmax>6.0s) volume: 11mL Mismatch Volume: InfantmL Infarction Location:None identified IMPRESSION: 1. 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 2. On CT perfusion, 19 mL area of increased T-max time in  the left MCA territory, correlating with the above occlusion and concerning for tissue at risk. 3. Approximately 80% left and 50% right ICA origin stenosis in the neck. 4. Apparent increased size of lucent lesions involving the C7 vertebral body relative to CT chest from February 14, 23. Given the apparent increase in size and patient's known malignancy, findings are concerning for osseous metastasis, but incompletely characterized on this study. An MRI with contrast or PET-CT could further evaluate if clinically warranted. 5. Masslike consolidation in the visualized left upper lobe, which is similar in size and was better characterized on prior CT chest from 02/27/2022. 6. Aortic Atherosclerosis (ICD10-I70.0) and Emphysema (ICD10-J43.9). Findings discussed with Dr. Theda Sers via telephone at 12:12 p.m. Electronically Signed   By: Margaretha Sheffield M.D.   On: 02/22/2022 12:17    PHYSICAL EXAM General:  Ill-appearing elderly patient in no acute distress Respiratory:  Regular respirations  but dyspnea with mild exertion noted. On supplemental O2  NEURO:  Mental Status: AA&Ox2 Speech/Language: speech is nonfluent with expressive aphasia but comprehension is intact. Able to state name and communicate by nodding yes/no  Cranial Nerves:  II: PERRL.  III, IV, VI: EOMI. Eyelids elevate symmetrically.  V: Sensation is intact to light touch and symmetrical to face.  VII: slight right facial droop   VIII: hearing intact to voice. IX, X: voice is hypophonic  XII: tongue is midline without fasciculations. Motor: 4/5 strength to LUE, 4-/5 to RUE, 3/5 to BLE Tone: is normal and bulk is normal Sensation- Intact to light touch bilaterally.  Coordination: FTN intact bilaterally Gait- deferred   ASSESSMENT/PLAN Ms. HIDAYA DANIEL is a 76 y.o. female with history of lung cancer with possible liver metastases, HTN, HLD, COPD, hypothyroidism, RUE DVT on Eliquis and sigmoid diverticulitis requiring colostomy  presenting with aphasia, right sided facial droop and right sided weakness. Occlusion of proximal left M2 was found, but TNK was not given due to use of apixaban and thrombectomy was not performed due to history of metastatic lung cancer and baseline mRS of 5. MRI shows scattered small acute infarcts, likely embolic.  Troponin was noted to be elevated, likely due to demand ischemia, but heparin was started as ACS was unable to be ruled out.  Stroke:  embolic shower due to hypercoagulability from advanced malignancy even on Eliquis Code Stroke CT head No acute abnormality. 84mm calcified focus in right frontal lobe, likely meningioma   CTA head & neck occlusion of proximal left M2 branch, 80% left and 50% right ICA stenosis CT perfusion 19 mL area of increase Tmax in left MCA territory MRI  small scattered bilateral anterior and posterior circulation infarcts in multiple vascular distributions 2D Echo EF 60-65%, Small to moderate luscent circumferential effusion with large  rind of likely hemorrhagic pericaridal material and RV diastolic collapse, concerning for cardiac tamponade LE venous Doppler acute DVT in the left peroneal vein LDL 61 HgbA1c 5.1 VTE prophylaxis - IV heparin Eliquis (apixaban) daily prior to admission, was put on heparin IV -> now stopped with concerns of hemorrhagic pericardial effusion.  Therapy recommendations:  pending Disposition:  pending, poor prognosis, recommend palliative care consult  Elevated troponin Cardiac tamponade SOB Peak troponin 2671, now down trending, likely demand ischemia but cannot rule out ACS TTE showed small to moderate luscent circumferential effusion with large  rind of likely hemorrhagic pericaridal material and RV diastolic collapse, concerning for cardiac tamponade Anticoagulate with heparin IV -> now stopped with concerns of hemorrhagic pericardial effusion.  Cardiology on board, may consider palliative drain.   LLE DVT Hx of RUE  DVT 12/2021 RUE DVT, started on Eliquis Per family, pt is compliant with eliquis at home This admission LE venous Doppler acute DVT in the left peroneal vein Etiology likely due to hypercoagulable state from advanced malignancy Fully anticoagulated with heparin -> now off heparin IV given concerns of hemorrhagic pericardial effusion  Stage IV Lung cancer with metastases Patient has history of bilateral lung cancer status post radiation  Mets to left femur, knee and liver CTA this admission showed large C7 lesion, likely metastasis mRS 4 at baseline, recommend palliative care consult  Hypertension Home meds:  losartan 50 mg daily Stable Long-term BP goal normotensive  Hyperlipidemia Home meds: Lipitor 20 LDL 61, goal < 70 High intensity statin not indicated as LDL below goal Continue statin at discharge  Other Stroke Risk Factors Advanced Age >/= 37  Former Cigarette smoker OSA on CPAP  Other Active Problems 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 Pt was seen and examined.   76 year old female with history of hypertension, hyperlipidemia, OSA on CPAP, COPD with home oxygen, stage IV bilateral lung cancer with metastasis, recent right upper extremity DVT on Eliquis admitted for right-sided weakness, right facial droop, dysphagia and dysarthria.  CT no acute abnormality, small right frontal meningioma.  CT head and neck showed left M2 occlusion, left ICA 80%, right ICA 50% stenosis, C7 lesion increased in size concerning for metastasis.  CTP 0/19.  MRI showed embolic shower with bilateral anterior and posterior circulation infarcts, right more than left.  EF 60 to 65%, however found to have large pericardial effusion likely hemorrhagic from Eliquis  use or malignancy.  LE venous Doppler showed acute left peroneal vein DVT.  LDL 61, A1c 5.1, creatinine 0.76.  WBC 11.3, hemoglobin 8.1, platelet 134.  On exam, daughter is at the bedside, pt is lethargic with mild respiratory distress, but awake, alert, eyes open, not answer orientation questions due to SOB. However, able to repeat "I am here" in 3 breaths, not able to repeat longer sentences due to SOB. Able to follow all simple commands. No gaze palsy, tracking bilaterally, blinking to visual threat bilaterally. Right facial droop. Tongue midline. LUE 4/5 and LLE 3+/5. RUE 3/5 with slight drift, RLE 3+/5 proximal and 3/5 distal ankle DF. Sensation symmetrical bilaterally subjectively, b/l FTN intact grossly, right slower than left, gait not tested.   Patient respite distress on exam likely due to cardiac tamponade.  Cardiology on board, may consider palliative drain/cardiac window for symptoms relief.  Patient embolic strokes likely due to hypercoagulable state from advanced malignancy.  Patient clearly failed Eliquis therapy given current embolic strokes and acute left lower extremity DVT.  Was on heparin IV, which was discontinued given possible hemorrhagic pericardial effusion.  With progressed malignancy, hypercarbia state failed anticoagulation, multiple comorbidities, considered poor prognosis, recommend palliative care consult.  For detailed assessment and plan, please refer to above as I have made changes wherever appropriate.   Rosalin Hawking, MD PhD Stroke Neurology 03/13/2022 11:15 AM   discussed with Dr. Bonner Puna and Dr. Johney Frame. I spent  extensive time with the patient and her daughter, more than 50% of which was spent in counseling and coordination of care, reviewing test results, images and medication, and discussing the diagnosis, treatment plan and potential prognosis. This patient's care requiresreview of multiple databases, neurological assessment, discussion with family, other  specialists and medical decision making of high complexity.     To contact Stroke Continuity provider, please refer to [email or web address] After hours, contact General Neurology

## 2022-03-13 NOTE — Progress Notes (Addendum)
ANTICOAGULATION CONSULT NOTE - Follow Up Consult  Pharmacy Consult for heparin Indication:  ACS in setting of acute embolic stroke despite Eliquis therapy for recent DVT  Labs: Recent Labs    03/09/2022 1128 02/22/2022 1132 02/19/2022 1457 03/13/22 0217  HGB 8.7* 9.9*  --  8.1*  HCT 29.7* 29.0*  --  28.0*  PLT 120*  --   --  134*  APTT 37*  --   --  49*  LABPROT 18.0*  --   --   --   INR 1.5*  --   --   --   HEPARINUNFRC  --   --   --  >1.10*  CREATININE 0.86 0.90  --  0.76  TROPONINIHS 2,671*  --  1,865*  --     Assessment: 76yo female subtherapeutic on heparin with initial dosing for ACS in setting of embolic stroke despite Eliquis for DVT; RN last saw that IV was patent ~0030, upon recheck she noted that IV was extravasated with new bruises on arm.  Goal of Therapy:  aPTT 66-85 seconds   Plan:  Will continue heparin infusion at 700 units/hr and check additional PTT; RN aware to watch for further issues.    [name], PharmD, BCPS  03/13/2022,3:20 AM

## 2022-03-13 NOTE — Consult Note (Signed)
[place] Nurse ostomy consult note Stoma type/location: LLQ colostomy Stomal assessment/size: 1 and 1.2 inch red, moist, raised Peristomal assessment: not seen today Treatment options for stomal/peristomal skin: skin barrier ring Output: brown stool Ostomy pouching: 2pc. 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 Last seen at outpatient wound care center on 02/17/22 by Dr. Magdalene River. Wound type: Pressure Injury POA: Yes Measurement:Per Dr. Jodene Nam last visit: 3cm x 2.7cm x 0.9cm  Wound WRU:EAVW red, with yellow fibrinous slough Drainage (amount, consistency, odor) small serous to light yellow Periwound:mild maceration  Dressing procedure/placement/frequency: I will continue the POC in place from Dr. Heber [place] using daily collagenase (santyl) but add a silicone foam topper to protect against periwound maceration. Turning and repositioning is in place. We will continue the mattress replacement with low air loss feature plus heel pressure redistribution.  Patient to continue follow up at the outpatient [facility] with Dr. Heber [place].  [place] nursing team will not follow, but will remain available to this patient, the nursing and medical teams.  Please re-consult if needed. Thanks, Maudie Flakes, MSN, RN, [place], Arther Abbott  Pager# [phone]

## 2022-03-13 NOTE — Progress Notes (Addendum)
Lower extremity venous study completed.  Preliminary results relayed to Bonner Puna, MD.  See CV Proc for preliminary results report.   Darlin Coco, RDMS, RVT

## 2022-03-14 DIAGNOSIS — I6340 Cerebral infarction due to embolism of unspecified cerebral artery: Secondary | ICD-10-CM | POA: Diagnosis not present

## 2022-03-14 DIAGNOSIS — I314 Cardiac tamponade: Secondary | ICD-10-CM

## 2022-03-14 DIAGNOSIS — N189 Chronic kidney disease, unspecified: Secondary | ICD-10-CM | POA: Diagnosis not present

## 2022-03-14 DIAGNOSIS — J9611 Chronic respiratory failure with hypoxia: Secondary | ICD-10-CM | POA: Diagnosis not present

## 2022-03-14 DIAGNOSIS — I82409 Acute embolism and thrombosis of unspecified deep veins of unspecified lower extremity: Secondary | ICD-10-CM

## 2022-03-14 DIAGNOSIS — Z933 Colostomy status: Secondary | ICD-10-CM | POA: Diagnosis not present

## 2022-03-14 DIAGNOSIS — C7951 Secondary malignant neoplasm of bone: Secondary | ICD-10-CM

## 2022-03-14 MED ORDER — GLYCOPYRROLATE 0.2 MG/ML IJ SOLN
0.2000 mg | INTRAMUSCULAR | Status: DC
  Administered 2022-03-14 (×3): 0.2 mg via INTRAVENOUS
  Filled 2022-03-14 (×3): qty 1

## 2022-03-14 MED ORDER — HYDROMORPHONE HCL 1 MG/ML IJ SOLN
1.0000 mg | INTRAMUSCULAR | Status: DC
Start: 2022-03-14 — End: 2022-03-14

## 2022-03-14 MED ORDER — SODIUM CHLORIDE 0.9 % IV SOLN
1.0000 mg/h | INTRAVENOUS | Status: DC
  Administered 2022-03-14: 1 mg/h via INTRAVENOUS
  Filled 2022-03-14: qty 5

## 2022-03-14 MED ORDER — LORAZEPAM 2 MG/ML IJ SOLN
1.0000 mg | INTRAMUSCULAR | Status: DC
  Administered 2022-03-14 (×4): 1 mg via INTRAVENOUS
  Filled 2022-03-14 (×4): qty 1

## 2022-03-14 MED ORDER — HYDROMORPHONE HCL 1 MG/ML IJ SOLN
1.0000 mg | INTRAMUSCULAR | Status: DC | PRN
  Administered 2022-03-15: 1 mg via INTRAVENOUS
  Filled 2022-03-14: qty 1

## 2022-03-14 MED ORDER — HYDROMORPHONE BOLUS VIA INFUSION: 0.5000 mg | INTRAVENOUS | Status: DC | PRN

## 2022-03-14 NOTE — Progress Notes (Signed)
Daily Progress Note   Patient Name: Holly [name]       Date: 03/14/2022 DOB: May 12, 1946  Age: 76 y.o. MRN#: [id number] Attending Physician: Patrecia Pour, MD Primary Care Physician: Harlan Stains, MD Admit Date: 02/19/2022  Reason for Consultation/Follow-up: Establishing goals of care and Terminal Care  Subjective: Medical records reviewed. Patient assessed at the bedside. Discussed with RN.  Questions and concerns addressed. PMT will continue to support holistically.   Length of Stay: 2   Physical Exam          Vital Signs: BP 134/73   Pulse (!) 111   Temp 97.7 F (36.5 C) (Oral)   Resp 18   Ht 5\' 5"  (1.651 m)   Wt 55.6 kg   SpO2 92%   BMI 20.40 kg/m  SpO2: SpO2: 92 % O2 Device: O2 Device: CPAP O2 Flow Rate: O2 Flow Rate (L/min): 6 L/min (per patient request for comfort)      Palliative Assessment/Data:       Palliative Care Assessment & Plan   Patient Profile: 76 y.o. 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    Patient was admitted for acute/subacute CVA and NSTEMI in the setting of acute stroke despite being on anticoagulation.  Found to have large pericardial effusion on TTE.  Also with UTI, osteomyelitis, compression fracture from recent fall. PMT has been consulted to assist with goals of care conversation.  Assessment: End of life  care  Recommendations/Plan: ***   Prognosis:  {Palliative Care Prognosis:23504}  Discharge Planning: {Palliative dispostion:23505}  Care plan was discussed with ***   Total time: I spent *** minutes in the care of the patient today in the above activities and documenting the encounter.  MDM ***         Norberta Keens, PA-C  Palliative Medicine Team Team phone # [phone]  Thank you for allowing the Palliative Medicine Team to assist in the care of this patient. Please utilize secure chat  with additional questions, if there is no response within 30 minutes please call the above phone number.  Palliative Medicine Team providers are available by phone from 7am to 7pm daily and can be reached through the team cell phone.  Should this patient require assistance outside of these hours, please call the patient's attending physician.

## 2022-03-14 NOTE — Progress Notes (Signed)
PROGRESS NOTE  Brief Narrative: Holly [name] is a 76 y.o. 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 Also noted with NSTEMI with troponin of 2671 for which eliquis was changed to heparin infusion. Subsequent echocardiogram revealed a large pericardial effusion for which anticoagulation was stopped. Palliative pericardial drain was considered, though with continued goals of care discussions, the patient, supported by family, has opted to transition toward hospice.  Subjective: Wore CPAP overnight, dyspnea and anxiety well-controlled. Daughter at bedside this morning. Niece (who is psychiatry NP) from out of town also later updated at bedside. No complaints.  Objective: BP (!) 150/74 (BP Location: Left Arm)   Pulse (!) 101   Temp 98.7 F (37.1 C) (Oral)   Resp 16   Ht 5\' 5"  (1.651 m)   Wt 55.6 kg   SpO2 92%   BMI 20.40 kg/m   Gen: 76yo F calm and comfortable. Pulm: Regular rate thru CPAP, diminished without crackles  CV: Regular tachycardia with blunted S1 S2.  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 Depending on her clinical course (hemodynamic stability, respiratory status) in the next 24 hours, we may pursue residential hospice placement.  Patrecia Pour, MD Pager on amion 03/14/2022, 12:43 PM

## 2022-03-14 NOTE — Progress Notes (Signed)
Patient ID:Holly [name]      DOB: 11/01/1945      CBS:[id number]      Palliative Medicine Team    Subjective: Bedside symptom check completed. Two family members present at time of visit.    Physical exam: Patient resting in bed with eyes closed at time of visit. Breathing even and non-labored with CPAP machine applied, no excessive secretions noted. Patient without physical or non-verbal signs of pain or discomfort at this time. Bedside RN and NT entered room to provide repositioning, oral care, remove dentures at family request. Patient briefly awakens and able to answer simple questions by nursing staff, interact with family present.   Assessment and plan: Bedside RN expressing patient's change in ability to safely take PO medications. This information will be relayed to Dorthy Cooler, PA for medication adjustment. Patient able to tolerate break from CPAP for oral care. Education provided to family regarding PRN medications available should the patient display symptoms requiring them. Family requested contact to attending MD, Dr. Bonner Puna paged. Bedside RN Cleatis Polka without any additional concerns at this time. Will continue to follow for any changes or advances.    Thank you for allowing the Palliative Medicine Team to assist in the care of this patient.     Damian Leavell, MSN, RN Palliative Medicine Team Team Phone: [phone]  This phone is monitored 7a-7p, please reach out to attending physician outside of these hours for urgent needs.

## 2022-03-14 NOTE — Progress Notes (Signed)
Patient transitioned to comfort care. No further CV recommendations.   Gwyndolyn Kaufman, MD

## 2022-03-14 NOTE — Progress Notes (Signed)
**Note De-Identified Holly Obfuscation** Speech Language Pathology Treatment:    Patient Details Name: Holly [name] MRN: [id number] DOB: 1946-05-18 Today's Date: 03/14/2022 Time:  -     Chart reviewed and pt is plan of care is now for comfort. Speech-cognitive eval not warranted and will sign off.                     Houston Siren  03/14/2022, 10:46 AM

## 2022-03-14 NOTE — Progress Notes (Signed)
Nutrition Brief Note  Pt screened for MST. Chart reviewed. Pt now transitioning to comfort care.  No further nutrition interventions planned at this time.  Please re-consult as needed.   Clayborne Dana, RDN, LDN Clinical Nutrition

## 2022-03-14 NOTE — TOC Progression Note (Signed)
Transition of Care [facility]) - Progression Note    Patient Details  Name: Holly [name] MRN: [id number] Date of Birth: [birth year]  Transition of Care [facility]) CM/SW [place], RN Phone [phone]  03/14/2022, 3:46 PM  Clinical Narrative:    [facility] consulted for Hospice Consult with [facility]. CM called referral to [facility] with [place].         Expected Discharge Plan and Services                                                 Social Determinants of Health (SDOH) Interventions    Readmission Risk Interventions    08/31/2021    2:17 PM 07/28/2021    1:48 PM 02/18/2021    3:22 PM  Readmission Risk Prevention Plan  Transportation Screening Complete Complete Complete  PCP or Specialist Appt within 5-7 Days   Complete  Home Care Screening   Complete  Medication Review (RN CM)   Complete  Medication Review (RN Care Manager) Complete Referral to Pharmacy   PCP or Specialist appointment within 3-5 days of discharge Complete Complete   HRI or Home Care Consult Complete Patient refused   SW Recovery Care/Counseling Consult Complete Complete   Palliative Care Screening Not Applicable Not [place] Not Applicable Not Applicable

## 2022-03-15 ENCOUNTER — Encounter (HOSPITAL_BASED_OUTPATIENT_CLINIC_OR_DEPARTMENT_OTHER): Payer: Medicare Other | Admitting: Internal Medicine

## 2022-03-15 DIAGNOSIS — I6340 Cerebral infarction due to embolism of unspecified cerebral artery: Secondary | ICD-10-CM | POA: Diagnosis not present

## 2022-03-16 ENCOUNTER — Ambulatory Visit (HOSPITAL_COMMUNITY): Payer: Medicare Other

## 2022-03-17 LAB — CULTURE, BLOOD (ROUTINE X 2)
Culture: NO GROWTH
Culture: NO GROWTH
Special Requests: ADEQUATE
Special Requests: ADEQUATE

## 2022-03-17 NOTE — Progress Notes (Signed)
RN walked in the room to check on pt.  Patient has stopped breathing,  Daughter ,  Holly [name] was at bedside. RN and charge nurse pronounced death at 4.  IV dilaudid discontinued.  Remainder iv dilaudid 41mg  wasted in stericycle  by RN and witnessed by the [employer] nurse,  Abbe Amsterdam. [place].  On call MD Dr Hal Hope paged and notified.  Support given to daughter.

## 2022-03-17 NOTE — Death Summary Note (Signed)
DEATH SUMMARY   Patient Details  Name: Holly [name] MRN: [id number] DOB: 07-03-1946 UUE:KCMKL, Holly Griffins, MD Admission/Discharge Information   Admit Date:  04-11-22  Date of Death: Date of Death: 04-14-2022  Time of Death: Time of Death: 0128  Length of Stay: 3   Principle Cause of death: Cardiac tamponade  Hospital Diagnoses: Principal Problem:   CVA (cerebral vascular accident) [facility]) Active Problems:   Elevated troponin   New onset right bundle branch block (RBBB)   SIRS (systemic inflammatory response syndrome) (HCC)   Leukocytosis   Hypotension   UTI (urinary tract infection)   Osteomyelitis (HCC)   COPD GOLD III    Chronic respiratory failure with hypoxia (HCC)   Compression fracture     Neoplasm of uncertain behavior of thyroid gland, right lobe   Goals of care, counseling/discussion   Anemia due to chronic kidney disease   DNR (do not resuscitate)   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 Also noted with NSTEMI with troponin of Jan 23, 2670 for which eliquis was changed to heparin infusion. Subsequent echocardiogram revealed a large pericardial effusion for which anticoagulation was stopped. Palliative  pericardial drain was considered, though with continued goals of care discussions, the patient, supported by family, has opted to transition toward hospice.  Procedures: None  Consultations: Cardiology, neurology, palliative care  The results of significant diagnostics from this hospitalization (including imaging, microbiology, ancillary and laboratory) are listed below for reference.   Significant Diagnostic Studies: MR BRAIN WO CONTRAST  Result Date: 03/13/2022 CLINICAL DATA:  Weakness EXAM: MRI HEAD WITHOUT CONTRAST TECHNIQUE: Multiplanar, multiecho pulse sequences of the brain and surrounding structures were obtained without intravenous contrast. COMPARISON:  Head CT and CTA from yesterday FINDINGS: Brain: Small acute infarcts in the bilateral cerebellum and left more than right cerebral convexity, most extensive in the left MCA territory along the frontal and parietal cortex. Mild patchy white matter involvement and involvement at the right caudate body. Confluent chronic small vessel ischemic gliosis in the cerebral white matter. Age congruent brain volume. No acute hemorrhage, hydrocephalus, or masslike finding. Vascular: Major flow voids are preserved Skull and upper cervical spine: No focal marrow lesion. Sinuses/Orbits: Right cataract resection IMPRESSION: 1. Small scattered acute infarcts in multiple vascular distributions suggesting central embolic disease. Greatest involvement is in the left MCA distribution. 2. Advanced chronic small vessel ischemia. Electronically Signed   By: Holly [name] M.D.   On: 03/13/2022 05:06   DG Chest Port 1 View  Result Date: 2022-04-11 CLINICAL DATA:  Weakness. Stroke. Non-small-cell lung cancer treated with chemotherapy and radiation therapy. EXAM: PORTABLE CHEST 1 VIEW COMPARISON:  08/28/2021.  Chest CT dated 11/30/2021. FINDINGS: Mildly enlarged cardiac silhouette, accentuated above prominent pericardial and epicardial  fat on the previous CT. Mild  patchy and linear opacity in the left upper lobe, similar to the previous CT. The previously demonstrated right middle lobe mass is currently visualized. No pleural fluid. Mildly tortuous and calcified thoracic aorta. Mild scoliosis. IMPRESSION: No acute abnormality. Grossly stable areas of consolidation in the left upper lobe compared to the previous CT dated 11/30/2021 and described in more detail in that report. Electronically Signed   By: Claudie Revering M.D.   On: 03/09/2022 13:31   ECHOCARDIOGRAM COMPLETE  Result Date: 03/13/2022    ECHOCARDIOGRAM REPORT   Patient Name:   [facility] Holly [name] Date of Exam: 03/13/2022 Medical Rec #:  [id number]     Height:       65.0 in Accession #:    [id number]    Weight:       122.6 lb Date of Birth:  03-26-1946     BSA:          1.607 m Patient Age:    5 years      BP:           151/82 mmHg Patient Gender: F             HR:           104 bpm. Exam Location:  Inpatient Procedure: 2D Echo, Cardiac Doppler, Color Doppler and Strain Analysis Indications:    CVA  History:        Patient has prior history of Echocardiogram examinations, most                 recent 07/25/2021. COPD, Signs/Symptoms:Dyspnea; Risk                 Factors:Hypertension.  Sonographer:    [place] Referring Phys: [id number] [place]  1. Left ventricular ejection fraction, by estimation, is 60 to 65%. The left ventricle has normal function. The left ventricle has no regional wall motion abnormalities. Left ventricular diastolic parameters are consistent with Grade I diastolic dysfunction (impaired relaxation).  2. Right ventricular systolic function is normal. The right ventricular size is normal.  3. Small to moderate luscent circumferential effusion with large rind of likely hemorrhagic pericaridal material and RV diastolic collapse Given history of DOAC use and malignant cancer likely blood Not epicardial fat as this was not present on TTE done  in October Dr Johney Frame aware Not clear  that she is a candiate for pericardial window and effusion cannot be cleared percutaneously . Large pericardial effusion. The pericardial effusion is circumferential. Findings are consistent with cardiac tamponade.  4. The mitral valve is normal in structure. No evidence of mitral valve regurgitation. No evidence of mitral stenosis.  5. The aortic valve was not well visualized. There is mild calcification of the aortic valve. There is mild thickening of the aortic valve. Aortic valve regurgitation is not visualized. Aortic valve sclerosis is present, with no evidence of aortic valve  stenosis.  6. The inferior vena cava is normal in size with greater than 50% respiratory variability, suggesting right atrial pressure of 3 mmHg. FINDINGS  Left Ventricle: Left ventricular ejection fraction, by estimation, is 60 to 65%. The left ventricle has normal function. The left ventricle has no regional wall motion abnormalities. The left ventricular internal cavity size was normal in size. There is  no left ventricular hypertrophy. Left ventricular diastolic parameters are consistent with Grade I diastolic dysfunction (impaired relaxation). Right Ventricle: The right ventricular size is normal. No increase in  right ventricular wall thickness. Right ventricular systolic function is normal. Left Atrium: Left atrial size was normal in size. Right Atrium: Right atrial size was normal in size. Pericardium: Small to moderate luscent circumferential effusion with large rind of likely hemorrhagic pericaridal material and RV diastolic collapse Given history of DOAC use and malignant cancer likely blood Not epicardial fat as this was not present on  TTE done in October Dr Johney Frame aware Not clear that she is a candiate for pericardial window and effusion cannot be cleared percutaneously. A large pericardial effusion is present. The pericardial effusion is circumferential. There is evidence of cardiac tamponade. Mitral Valve: The mitral  valve is normal in structure. No evidence of mitral valve regurgitation. No evidence of mitral valve stenosis. Tricuspid Valve: The tricuspid valve is normal in structure. Tricuspid valve regurgitation is not demonstrated. No evidence of tricuspid stenosis. Aortic Valve: The aortic valve was not well visualized. There is mild calcification of the aortic valve. There is mild thickening of the aortic valve. Aortic valve regurgitation is not visualized. Aortic valve sclerosis is present, with no evidence of aortic valve stenosis. Aortic valve mean gradient measures 3.2 mmHg. Aortic valve peak gradient measures 6.6 mmHg. Pulmonic Valve: The pulmonic valve was normal in structure. Pulmonic valve regurgitation is not visualized. No evidence of pulmonic stenosis. Aorta: The aortic root is normal in size and structure. Venous: The inferior vena cava is normal in size with greater than 50% respiratory variability, suggesting right atrial pressure of 3 mmHg. IAS/Shunts: No atrial level shunt detected by color flow Doppler.  LEFT VENTRICLE PLAX 2D LVIDd:         4.30 cm     Diastology LVIDs:         2.30 cm     LV e' medial:    4.12 cm/s LV PW:         1.10 cm     LV E/e' medial:  14.3 LV IVS:        0.90 cm     LV e' lateral:   7.45 cm/s                            LV E/e' lateral: 7.9  LV Volumes (MOD) LV vol d, MOD A2C: 13.9 ml LV vol d, MOD A4C: 28.1 ml LV vol s, MOD A2C: 11.1 ml LV vol s, MOD A4C: 9.6 ml LV SV MOD A2C:     2.8 ml LV SV MOD A4C:     28.1 ml LV SV MOD BP:      8.9 ml RIGHT VENTRICLE RV S prime:     11.90 cm/s TAPSE (M-mode): 1.1 cm LEFT ATRIUM             Index LA Vol (A2C):   25.3 ml 15.75 ml/m LA Vol (A4C):   18.1 ml 11.27 ml/m LA Biplane Vol: 21.8 ml 13.57 ml/m  AORTIC VALVE                   PULMONIC VALVE AV Vmax:           128.25 cm/s PV Vmax:       1.44 m/s AV Vmean:          81.300 cm/s PV Vmean:      92.100 cm/s AV VTI:            0.160 m     PV VTI:  0.200 m AV Peak Grad:      6.6 mmHg     PV Peak grad:  8.3 mmHg AV Mean Grad:      3.2 mmHg    PV Mean grad:  4.0 mmHg LVOT Vmax:         96.30 cm/s LVOT Vmean:        58.700 cm/s LVOT VTI:          0.115 m LVOT/AV VTI ratio: 0.72  AORTA Ao Asc diam: 3.00 cm MITRAL VALVE                TRICUSPID VALVE MV Area (PHT): 4.49 cm     TR Peak grad:   22.8 mmHg MV Decel Time: 169 msec     TR Vmax:        239.00 cm/s MV E velocity: 58.90 cm/s MV A velocity: 103.95 cm/s  SHUNTS MV E/A ratio:  0.57         Systemic VTI: 0.12 m Jenkins Rouge MD Electronically signed by Jenkins Rouge MD Signature Date/Time: 03/13/2022/11:56:27 AM    Final    CT HEAD CODE STROKE WO CONTRAST  Result Date: 02/27/2022 CLINICAL DATA:  Code stroke.  Neuro deficit, acute, stroke suspected EXAM: CT HEAD WITHOUT CONTRAST TECHNIQUE: Contiguous axial images were obtained from the base of the skull through the vertex without intravenous contrast. RADIATION DOSE REDUCTION: This exam was performed according to the departmental dose-optimization program which includes automated exposure control, adjustment of the mA and/or kV according to patient size and/or use of iterative reconstruction technique. COMPARISON:  CT head July 24, 2021. FINDINGS: Brain: No evidence of acute infarction, hemorrhage, hydrocephalus, or extra-axial collection. Redemonstrated 8 mm rounded dural-based calcified focus overlying the right frontal lobe. Similar severe patchy white matter hypodensities in the white matter, nonspecific but compatible with chronic microvascular ischemic disease. Vascular: Calcific intracranial spaces. No evidence vessel identified. Skull: No acute fracture. Sinuses/Orbits: Clear sinuses.  No acute orbital findings. Other: No mastoid effusions. ASPECTS [facility] Stroke Program Early CT Score) total score (0-10 with 10 being normal): 10. IMPRESSION: 1. No evidence of acute intracranial abnormality.  ASPECTS is 10. 2. Severe chronic microvascular ischemic disease. 3. Redemonstrated 8 mm rounded  dural-based calcified focus overlying the right frontal lobe, suspicious for an incidental meningioma. Code stroke imaging results were communicated on 03/07/2022 at 11:39 am to provider [facility] via secure text paging. Electronically Signed   By: Margaretha Sheffield M.D.   On: 03/03/2022 11:42   VAS [place] LOWER EXTREMITY VENOUS (DVT)  Result Date: 03/13/2022  Lower Venous DVT Study Patient Name:  [facility] Holly [name]  Date of Exam:   03/13/2022 Medical Rec #: [id number]      Accession #:    [id number] Date of Birth: September 29, 1946      Patient Gender: F Patient Age:   66 years Exam Location:  [facility] Procedure:      VAS [place] LOWER EXTREMITY VENOUS (DVT) Referring Phys: Cornelius Moras XU --------------------------------------------------------------------------------  Indications: Embolic stroke.  Anticoagulation: Heparin. Comparison Study: 01-11-2022 Upper extremity venous right was positive for DVT.                    02-22-2021 Lower extremity venous left was negative for DVT. Performing Technologist: Darlin Coco RDMS, RVT  Examination Guidelines: A complete evaluation includes B-mode imaging, spectral Doppler, color Doppler, and power Doppler as needed of all accessible portions of each vessel. Bilateral testing is considered an integral part of a complete  examination. Limited examinations for reoccurring indications may be performed as noted. The reflux portion of the exam is performed with the patient in reverse Trendelenburg.  +---------+---------------+---------+-----------+----------+--------------+ RIGHT    CompressibilityPhasicitySpontaneityPropertiesThrombus Aging +---------+---------------+---------+-----------+----------+--------------+ CFV      Full           Yes      Yes                                 +---------+---------------+---------+-----------+----------+--------------+ SFJ      Full                                                         +---------+---------------+---------+-----------+----------+--------------+ FV Prox  Full                                                        +---------+---------------+---------+-----------+----------+--------------+ FV Mid   Full                                                        +---------+---------------+---------+-----------+----------+--------------+ FV DistalFull                                                        +---------+---------------+---------+-----------+----------+--------------+ PFV      Full                                                        +---------+---------------+---------+-----------+----------+--------------+ POP      Full           Yes      Yes                                 +---------+---------------+---------+-----------+----------+--------------+ PTV      Full                                                        +---------+---------------+---------+-----------+----------+--------------+ PERO     Full                                                        +---------+---------------+---------+-----------+----------+--------------+ Gastroc  Full                                                        +---------+---------------+---------+-----------+----------+--------------+   +---------+---------------+---------+-----------+----------+--------------+  LEFT     CompressibilityPhasicitySpontaneityPropertiesThrombus Aging +---------+---------------+---------+-----------+----------+--------------+ CFV      Full           Yes      Yes                                 +---------+---------------+---------+-----------+----------+--------------+ SFJ      Full                                                        +---------+---------------+---------+-----------+----------+--------------+ FV Prox  Full                                                         +---------+---------------+---------+-----------+----------+--------------+ FV Mid   Full                                                        +---------+---------------+---------+-----------+----------+--------------+ FV DistalFull                                                        +---------+---------------+---------+-----------+----------+--------------+ PFV      Full                                                        +---------+---------------+---------+-----------+----------+--------------+ POP      Full           Yes      Yes                                 +---------+---------------+---------+-----------+----------+--------------+ PTV      Full                                                        +---------+---------------+---------+-----------+----------+--------------+ PERO     Partial        Yes      Yes                  Acute          +---------+---------------+---------+-----------+----------+--------------+ Gastroc  Full                                                        +---------+---------------+---------+-----------+----------+--------------+  Summary: RIGHT: - There is no evidence of deep vein thrombosis in the lower extremity.  - No cystic structure found in the popliteal fossa.  LEFT: - Findings consistent with acute deep vein thrombosis involving the left peroneal veins. - No cystic structure found in the popliteal fossa.  *See table(s) above for measurements and observations. Electronically signed by Servando Snare MD on 03/13/2022 at 4:25:13 PM.    Final    CT ANGIO HEAD NECK W WO CM W PERF (CODE STROKE)  Result Date: 03/11/2022 CLINICAL DATA:  Neuro deficit, acute, stroke suspected Right weakness and aphasia symptom onset Monday EXAM: CT ANGIOGRAPHY HEAD AND NECK CT PERFUSION BRAIN TECHNIQUE: Multidetector CT imaging of the head and neck was performed using the standard protocol during bolus administration of intravenous  contrast. Multiplanar CT image reconstructions and MIPs were obtained to evaluate the vascular anatomy. Carotid stenosis measurements (when applicable) are obtained utilizing NASCET criteria, using the distal internal carotid diameter as the denominator. Multiphase CT imaging of the brain was performed following IV bolus contrast injection. Subsequent parametric perfusion maps were calculated using RAPID software. RADIATION DOSE REDUCTION: This exam was performed according to the departmental dose-optimization program which includes automated exposure control, adjustment of the mA and/or kV according to patient size and/or use of iterative reconstruction technique. CONTRAST:  162mL OMNIPAQUE IOHEXOL 350 MG/ML SOLN COMPARISON:  None Available. Same day CT head. FINDINGS: CTA NECK FINDINGS Aortic arch: Atherosclerosis of the aorta and great vessel origins. Great vessel origins are patent without significant stenosis. Right carotid system: Atherosclerosis at the carotid bifurcation with approximately 50% stenosis of the ICA origin. Left carotid system: Extensive predominately calcific atherosclerosis at the carotid bifurcation with approximately 80% stenosis of the ICA origin. Vertebral arteries: Right dominant. Bilateral ICAs are patent without significant (greater than 50%) stenosis. Atherosclerosis at the left vertebral artery origin. Skeleton: Apparent increased size of lucent lesions involving the C7 vertebral body sub to CT chest from February 14, 23. Given the apparent increase in size and patient's known malignancy. Multilevel degenerative change. Other neck: No acute findings. Upper chest: Masslike consolidation in the visualized left upper lobe, which is similar in size (approximately 3.5 x 1.6 cm on series 7, image 288) and was better characterized on prior CT chest from 02/27/2022. emphysema. Aortic atherosclerosis. Review of the MIP images confirms the above findings CTA HEAD FINDINGS Anterior circulation:  Bilateral intracranial ICAs are patent with mild narrowing due to calcific atherosclerosis. Bilateral M1 MCAs and ACAs are patent without proximal hemodynamically significant stenosis. Occlusion of a left superior M2 MCA branch proximally (series 7, image 93; series 8, image 89). Posterior circulation: Bilateral intradural vertebral arteries, basilar artery, and bilateral posterior cerebral arteries are patent without proximal hemodynamically significant stenosis. Venous sinuses: As permitted by contrast timing, patent. CT Brain Perfusion Findings: ASPECTS: 10. CBF (<30%) Volume: 68mL Perfusion (Tmax>6.0s) volume: 42mL Mismatch Volume: InfantmL Infarction Location:None identified IMPRESSION: 1. 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 2. On CT perfusion, 19 mL area of increased T-max time in the left MCA territory, correlating with the above occlusion and concerning for tissue at risk. 3. Approximately 80% left and 50% right ICA origin stenosis in the neck. 4. Apparent increased size of lucent lesions involving the C7 vertebral body relative to CT chest from February 14, 23. Given the apparent increase in size and patient's known malignancy, findings are concerning for osseous metastasis, but incompletely characterized on this study. An MRI with contrast or PET-CT could further evaluate if  clinically warranted. 5. Masslike consolidation in the visualized left upper lobe, which is similar in size and was better characterized on prior CT chest from 02/27/2022. 6. Aortic Atherosclerosis (ICD10-I70.0) and Emphysema (ICD10-J43.9). Findings discussed with Dr. Theda Sers via telephone at 12:12 p.m. Electronically Signed   By: Margaretha Sheffield M.D.   On: 03/07/2022 12:17    Microbiology: Recent Results (from the past 240 hour(s))  Culture, blood (Routine X 2) w Reflex to ID Panel     Status: None (Preliminary result)   Collection Time: 02/22/2022  7:59 PM   Specimen: BLOOD RIGHT HAND  Result Value Ref Range Status    Specimen Description BLOOD RIGHT HAND  Final   Special Requests AEROBIC BOTTLE ONLY Blood Culture adequate volume  Final   Culture   Final    NO GROWTH 2 DAYS Performed at [facility], [place] [address] [place], [place] [address]    Report Status PENDING  Incomplete  Culture, blood (Routine X 2) w Reflex to ID Panel     Status: None (Preliminary result)   Collection Time: 03/16/2022  7:59 PM   Specimen: BLOOD LEFT FOREARM  Result Value Ref Range Status   Specimen Description BLOOD LEFT FOREARM  Final   Special Requests AEROBIC BOTTLE ONLY Blood Culture adequate volume  Final   Culture   Final    NO GROWTH 2 DAYS Performed at [facility], [place] [address], [place], [place] [address]    Report Status PENDING  Incomplete    Patrecia Pour, MD 04-11-22

## 2022-03-17 DEATH — deceased

## 2022-03-22 ENCOUNTER — Inpatient Hospital Stay: Payer: Medicare Other | Admitting: Physical Medicine & Rehabilitation

## 2022-03-22 ENCOUNTER — Other Ambulatory Visit (HOSPITAL_COMMUNITY): Payer: Medicare Other

## 2022-03-29 ENCOUNTER — Ambulatory Visit: Payer: Medicare Other | Admitting: Internal Medicine

## 2022-04-05 ENCOUNTER — Encounter: Payer: Self-pay | Admitting: *Deleted

## 2022-04-05 NOTE — Progress Notes (Signed)
  Patient passed. 

## 2022-05-17 ENCOUNTER — Ambulatory Visit: Payer: Medicare Other | Admitting: Infectious Disease

## 2022-05-18 ENCOUNTER — Ambulatory Visit: Payer: Medicare Other | Admitting: Infectious Disease
